# Patient Record
Sex: Male | Born: 1955
Health system: Southern US, Community
[De-identification: ages and names within clinical notes are randomized; demographics above are authoritative.]

## PROBLEM LIST (undated history)

## (undated) ENCOUNTER — Ambulatory Visit (HOSPITAL_COMMUNITY): Admission: EM | Payer: Medicare Other | Source: Home / Self Care

## (undated) DIAGNOSIS — I214 Non-ST elevation (NSTEMI) myocardial infarction: Secondary | ICD-10-CM

## (undated) DIAGNOSIS — Z8701 Personal history of pneumonia (recurrent): Secondary | ICD-10-CM

## (undated) DIAGNOSIS — E669 Obesity, unspecified: Secondary | ICD-10-CM

## (undated) DIAGNOSIS — F329 Major depressive disorder, single episode, unspecified: Secondary | ICD-10-CM

## (undated) DIAGNOSIS — F32A Depression, unspecified: Secondary | ICD-10-CM

## (undated) DIAGNOSIS — Z720 Tobacco use: Secondary | ICD-10-CM

## (undated) DIAGNOSIS — I255 Ischemic cardiomyopathy: Secondary | ICD-10-CM

## (undated) DIAGNOSIS — E119 Type 2 diabetes mellitus without complications: Secondary | ICD-10-CM

## (undated) DIAGNOSIS — I2510 Atherosclerotic heart disease of native coronary artery without angina pectoris: Secondary | ICD-10-CM

## (undated) DIAGNOSIS — Z9289 Personal history of other medical treatment: Secondary | ICD-10-CM

## (undated) DIAGNOSIS — K219 Gastro-esophageal reflux disease without esophagitis: Secondary | ICD-10-CM

## (undated) DIAGNOSIS — E785 Hyperlipidemia, unspecified: Secondary | ICD-10-CM

## (undated) DIAGNOSIS — I5042 Chronic combined systolic (congestive) and diastolic (congestive) heart failure: Secondary | ICD-10-CM

## (undated) DIAGNOSIS — G4733 Obstructive sleep apnea (adult) (pediatric): Secondary | ICD-10-CM

## (undated) DIAGNOSIS — I10 Essential (primary) hypertension: Secondary | ICD-10-CM

## (undated) HISTORY — DX: Personal history of other medical treatment: Z92.89

## (undated) HISTORY — DX: Atherosclerotic heart disease of native coronary artery without angina pectoris: I25.10

## (undated) HISTORY — DX: Major depressive disorder, single episode, unspecified: F32.9

## (undated) HISTORY — DX: Ischemic cardiomyopathy: I25.5

## (undated) HISTORY — DX: Hyperlipidemia, unspecified: E78.5

## (undated) HISTORY — DX: Tobacco use: Z72.0

## (undated) HISTORY — PX: CORONARY ANGIOPLASTY WITH STENT PLACEMENT: SHX49

## (undated) HISTORY — DX: Depression, unspecified: F32.A

## (undated) HISTORY — DX: Essential (primary) hypertension: I10

## (undated) HISTORY — DX: Chronic combined systolic (congestive) and diastolic (congestive) heart failure: I50.42

## (undated) HISTORY — DX: Obstructive sleep apnea (adult) (pediatric): G47.33

## (undated) HISTORY — DX: Obesity, unspecified: E66.9

## (undated) HISTORY — DX: Gastro-esophageal reflux disease without esophagitis: K21.9

---

## 1998-02-14 ENCOUNTER — Emergency Department (HOSPITAL_COMMUNITY): Admission: EM | Admit: 1998-02-14 | Discharge: 1998-02-14 | Payer: Self-pay | Admitting: Emergency Medicine

## 2000-11-17 ENCOUNTER — Inpatient Hospital Stay (HOSPITAL_COMMUNITY): Admission: EM | Admit: 2000-11-17 | Discharge: 2000-11-22 | Payer: Self-pay

## 2001-01-13 ENCOUNTER — Ambulatory Visit (HOSPITAL_COMMUNITY): Admission: RE | Admit: 2001-01-13 | Discharge: 2001-01-13 | Payer: Self-pay | Admitting: Cardiology

## 2001-01-14 ENCOUNTER — Encounter: Payer: Self-pay | Admitting: Cardiology

## 2001-09-10 ENCOUNTER — Ambulatory Visit (HOSPITAL_COMMUNITY): Admission: RE | Admit: 2001-09-10 | Discharge: 2001-09-10 | Payer: Self-pay | Admitting: Cardiology

## 2001-09-11 ENCOUNTER — Encounter: Payer: Self-pay | Admitting: Cardiology

## 2001-12-04 ENCOUNTER — Ambulatory Visit (HOSPITAL_COMMUNITY): Admission: RE | Admit: 2001-12-04 | Discharge: 2001-12-04 | Payer: Self-pay | Admitting: Cardiology

## 2003-09-22 ENCOUNTER — Encounter (HOSPITAL_COMMUNITY): Admission: RE | Admit: 2003-09-22 | Discharge: 2003-12-21 | Payer: Self-pay | Admitting: Cardiology

## 2004-12-17 ENCOUNTER — Inpatient Hospital Stay (HOSPITAL_COMMUNITY): Admission: EM | Admit: 2004-12-17 | Discharge: 2004-12-21 | Payer: Self-pay | Admitting: Emergency Medicine

## 2005-08-21 ENCOUNTER — Emergency Department (HOSPITAL_COMMUNITY): Admission: EM | Admit: 2005-08-21 | Discharge: 2005-08-21 | Payer: Self-pay | Admitting: Emergency Medicine

## 2005-09-10 ENCOUNTER — Emergency Department (HOSPITAL_COMMUNITY): Admission: EM | Admit: 2005-09-10 | Discharge: 2005-09-10 | Payer: Self-pay | Admitting: Emergency Medicine

## 2006-02-09 DIAGNOSIS — Z9861 Coronary angioplasty status: Secondary | ICD-10-CM | POA: Insufficient documentation

## 2006-03-01 ENCOUNTER — Inpatient Hospital Stay (HOSPITAL_COMMUNITY): Admission: EM | Admit: 2006-03-01 | Discharge: 2006-03-05 | Payer: Self-pay | Admitting: Emergency Medicine

## 2006-03-07 ENCOUNTER — Ambulatory Visit: Payer: Self-pay | Admitting: Family Medicine

## 2006-03-08 ENCOUNTER — Ambulatory Visit: Payer: Self-pay | Admitting: *Deleted

## 2006-03-19 ENCOUNTER — Ambulatory Visit: Payer: Self-pay | Admitting: Family Medicine

## 2006-05-27 ENCOUNTER — Ambulatory Visit: Payer: Self-pay | Admitting: Family Medicine

## 2006-08-05 ENCOUNTER — Ambulatory Visit: Payer: Self-pay | Admitting: Family Medicine

## 2006-08-18 ENCOUNTER — Emergency Department (HOSPITAL_COMMUNITY): Admission: EM | Admit: 2006-08-18 | Discharge: 2006-08-18 | Payer: Self-pay | Admitting: Emergency Medicine

## 2006-09-13 ENCOUNTER — Ambulatory Visit: Payer: Self-pay | Admitting: Family Medicine

## 2006-10-15 ENCOUNTER — Ambulatory Visit: Payer: Self-pay | Admitting: Family Medicine

## 2006-10-23 ENCOUNTER — Ambulatory Visit: Payer: Self-pay | Admitting: Cardiovascular Disease

## 2006-10-30 ENCOUNTER — Ambulatory Visit: Payer: Self-pay | Admitting: Cardiovascular Disease

## 2006-11-01 ENCOUNTER — Ambulatory Visit: Payer: Self-pay | Admitting: Cardiovascular Disease

## 2006-11-01 ENCOUNTER — Ambulatory Visit (HOSPITAL_COMMUNITY): Admission: RE | Admit: 2006-11-01 | Discharge: 2006-11-02 | Payer: Self-pay | Admitting: Cardiovascular Disease

## 2006-11-21 ENCOUNTER — Ambulatory Visit: Payer: Self-pay | Admitting: Cardiovascular Disease

## 2007-01-27 DIAGNOSIS — E785 Hyperlipidemia, unspecified: Secondary | ICD-10-CM

## 2007-01-27 DIAGNOSIS — F17200 Nicotine dependence, unspecified, uncomplicated: Secondary | ICD-10-CM | POA: Insufficient documentation

## 2007-01-27 DIAGNOSIS — K219 Gastro-esophageal reflux disease without esophagitis: Secondary | ICD-10-CM

## 2007-02-12 ENCOUNTER — Encounter (INDEPENDENT_AMBULATORY_CARE_PROVIDER_SITE_OTHER): Payer: Self-pay | Admitting: Family Medicine

## 2007-02-12 ENCOUNTER — Ambulatory Visit: Payer: Self-pay | Admitting: Cardiovascular Disease

## 2007-02-14 ENCOUNTER — Encounter (INDEPENDENT_AMBULATORY_CARE_PROVIDER_SITE_OTHER): Payer: Self-pay | Admitting: Family Medicine

## 2007-02-20 ENCOUNTER — Telehealth (INDEPENDENT_AMBULATORY_CARE_PROVIDER_SITE_OTHER): Payer: Self-pay | Admitting: *Deleted

## 2007-02-26 ENCOUNTER — Encounter (INDEPENDENT_AMBULATORY_CARE_PROVIDER_SITE_OTHER): Payer: Self-pay | Admitting: *Deleted

## 2007-03-04 ENCOUNTER — Ambulatory Visit: Payer: Self-pay | Admitting: Family Medicine

## 2007-03-05 ENCOUNTER — Encounter (INDEPENDENT_AMBULATORY_CARE_PROVIDER_SITE_OTHER): Payer: Self-pay | Admitting: Family Medicine

## 2007-03-05 LAB — CONVERTED CEMR LAB
AST: 20 units/L (ref 0–37)
CO2: 27 meq/L (ref 19–32)
Chloride: 108 meq/L (ref 96–112)
Cholesterol: 134 mg/dL (ref 0–200)
Glucose, Bld: 96 mg/dL (ref 70–99)
LDL Cholesterol: 78 mg/dL (ref 0–99)
Potassium: 4.2 meq/L (ref 3.5–5.3)
Sodium: 145 meq/L (ref 135–145)
Total Protein: 6.8 g/dL (ref 6.0–8.3)

## 2007-03-14 ENCOUNTER — Encounter (INDEPENDENT_AMBULATORY_CARE_PROVIDER_SITE_OTHER): Payer: Self-pay | Admitting: Family Medicine

## 2007-06-12 HISTORY — PX: CORONARY STENT PLACEMENT: SHX1402

## 2007-09-08 ENCOUNTER — Ambulatory Visit: Payer: Self-pay | Admitting: Cardiovascular Disease

## 2007-09-12 ENCOUNTER — Encounter (INDEPENDENT_AMBULATORY_CARE_PROVIDER_SITE_OTHER): Payer: Self-pay | Admitting: Family Medicine

## 2007-09-12 ENCOUNTER — Telehealth (INDEPENDENT_AMBULATORY_CARE_PROVIDER_SITE_OTHER): Payer: Self-pay | Admitting: Internal Medicine

## 2007-09-21 ENCOUNTER — Emergency Department (HOSPITAL_COMMUNITY): Admission: EM | Admit: 2007-09-21 | Discharge: 2007-09-21 | Payer: Self-pay | Admitting: Emergency Medicine

## 2007-11-07 ENCOUNTER — Encounter (INDEPENDENT_AMBULATORY_CARE_PROVIDER_SITE_OTHER): Payer: Self-pay | Admitting: Family Medicine

## 2008-01-09 ENCOUNTER — Ambulatory Visit: Payer: Self-pay | Admitting: Nurse Practitioner

## 2008-02-05 ENCOUNTER — Telehealth (INDEPENDENT_AMBULATORY_CARE_PROVIDER_SITE_OTHER): Payer: Self-pay | Admitting: *Deleted

## 2008-02-14 ENCOUNTER — Emergency Department (HOSPITAL_COMMUNITY): Admission: EM | Admit: 2008-02-14 | Discharge: 2008-02-14 | Payer: Self-pay | Admitting: Emergency Medicine

## 2008-02-17 ENCOUNTER — Ambulatory Visit: Payer: Self-pay | Admitting: Family Medicine

## 2008-02-17 DIAGNOSIS — B86 Scabies: Secondary | ICD-10-CM | POA: Insufficient documentation

## 2008-02-19 ENCOUNTER — Emergency Department (HOSPITAL_COMMUNITY): Admission: EM | Admit: 2008-02-19 | Discharge: 2008-02-19 | Payer: Self-pay | Admitting: Emergency Medicine

## 2008-02-23 ENCOUNTER — Telehealth (INDEPENDENT_AMBULATORY_CARE_PROVIDER_SITE_OTHER): Payer: Self-pay | Admitting: *Deleted

## 2008-02-24 ENCOUNTER — Ambulatory Visit: Payer: Self-pay | Admitting: Family Medicine

## 2008-03-09 LAB — CONVERTED CEMR LAB
ALT: 31 units/L (ref 0–53)
AST: 16 units/L (ref 0–37)
Alkaline Phosphatase: 91 units/L (ref 39–117)
CO2: 21 meq/L (ref 19–32)
Chloride: 108 meq/L (ref 96–112)
Cholesterol: 204 mg/dL — ABNORMAL HIGH (ref 0–200)
Creatinine, Ser: 1.33 mg/dL (ref 0.40–1.50)
Glucose, Bld: 99 mg/dL (ref 70–99)
HDL: 52 mg/dL (ref >39)
Sodium: 144 meq/L (ref 135–145)
Total Bilirubin: 0.4 mg/dL (ref 0.3–1.2)
Triglycerides: 91 mg/dL (ref <150)
VLDL: 18 mg/dL (ref 0–40)

## 2008-04-12 ENCOUNTER — Ambulatory Visit: Payer: Self-pay | Admitting: Family Medicine

## 2008-07-22 ENCOUNTER — Ambulatory Visit: Payer: Self-pay | Admitting: Cardiovascular Disease

## 2008-07-23 ENCOUNTER — Telehealth (INDEPENDENT_AMBULATORY_CARE_PROVIDER_SITE_OTHER): Payer: Self-pay | Admitting: *Deleted

## 2008-08-03 ENCOUNTER — Encounter (INDEPENDENT_AMBULATORY_CARE_PROVIDER_SITE_OTHER): Payer: Self-pay | Admitting: *Deleted

## 2008-08-30 ENCOUNTER — Ambulatory Visit: Payer: Self-pay | Admitting: Family Medicine

## 2008-08-30 DIAGNOSIS — B360 Pityriasis versicolor: Secondary | ICD-10-CM | POA: Insufficient documentation

## 2008-08-31 ENCOUNTER — Ambulatory Visit: Payer: Self-pay | Admitting: Family Medicine

## 2008-09-01 DIAGNOSIS — I10 Essential (primary) hypertension: Secondary | ICD-10-CM | POA: Insufficient documentation

## 2008-09-01 LAB — CONVERTED CEMR LAB
ALT: 23 units/L (ref 0–53)
Alkaline Phosphatase: 79 units/L (ref 39–117)
BUN: 13 mg/dL (ref 6–23)
Basophils Absolute: 0 10*3/uL (ref 0.0–0.1)
Basophils Relative: 1 % (ref 0–1)
CO2: 24 meq/L (ref 19–32)
Calcium: 9.6 mg/dL (ref 8.4–10.5)
HCT: 46.8 % (ref 39.0–52.0)
LDL Cholesterol: 78 mg/dL (ref 0–99)
Lymphs Abs: 3.4 10*3/uL (ref 0.7–4.0)
MCHC: 34.4 g/dL (ref 30.0–36.0)
Monocytes Absolute: 0.5 10*3/uL (ref 0.1–1.0)
Neutrophils Relative %: 29 % — ABNORMAL LOW (ref 43–77)
Platelets: 209 10*3/uL (ref 150–400)
Potassium: 4.2 meq/L (ref 3.5–5.3)
RDW: 16.3 % — ABNORMAL HIGH (ref 11.5–15.5)
Total CHOL/HDL Ratio: 3
Total Protein: 7.1 g/dL (ref 6.0–8.3)
VLDL: 11 mg/dL (ref 0–40)

## 2008-09-15 ENCOUNTER — Ambulatory Visit (HOSPITAL_BASED_OUTPATIENT_CLINIC_OR_DEPARTMENT_OTHER): Admission: RE | Admit: 2008-09-15 | Discharge: 2008-09-15 | Payer: Self-pay | Admitting: Family Medicine

## 2008-09-15 ENCOUNTER — Ambulatory Visit: Payer: Self-pay | Admitting: Family Medicine

## 2008-09-15 DIAGNOSIS — G4733 Obstructive sleep apnea (adult) (pediatric): Secondary | ICD-10-CM | POA: Insufficient documentation

## 2008-09-16 ENCOUNTER — Ambulatory Visit: Payer: Self-pay | Admitting: Family Medicine

## 2008-09-18 ENCOUNTER — Ambulatory Visit: Payer: Self-pay | Admitting: Internal Medicine

## 2008-09-30 ENCOUNTER — Ambulatory Visit: Payer: Self-pay | Admitting: Family Medicine

## 2008-10-19 ENCOUNTER — Ambulatory Visit: Payer: Self-pay | Admitting: Family Medicine

## 2008-10-21 ENCOUNTER — Encounter (INDEPENDENT_AMBULATORY_CARE_PROVIDER_SITE_OTHER): Payer: Self-pay | Admitting: Family Medicine

## 2008-11-09 ENCOUNTER — Encounter (INDEPENDENT_AMBULATORY_CARE_PROVIDER_SITE_OTHER): Payer: Self-pay | Admitting: Family Medicine

## 2008-11-12 ENCOUNTER — Encounter (INDEPENDENT_AMBULATORY_CARE_PROVIDER_SITE_OTHER): Payer: Self-pay | Admitting: Nurse Practitioner

## 2008-11-15 ENCOUNTER — Encounter (INDEPENDENT_AMBULATORY_CARE_PROVIDER_SITE_OTHER): Payer: Self-pay | Admitting: Family Medicine

## 2008-12-20 ENCOUNTER — Ambulatory Visit: Payer: Self-pay | Admitting: Family Medicine

## 2008-12-30 ENCOUNTER — Encounter (INDEPENDENT_AMBULATORY_CARE_PROVIDER_SITE_OTHER): Payer: Self-pay | Admitting: Family Medicine

## 2009-02-16 ENCOUNTER — Ambulatory Visit: Payer: Self-pay | Admitting: Cardiovascular Disease

## 2009-03-19 ENCOUNTER — Ambulatory Visit: Payer: Self-pay | Admitting: Internal Medicine

## 2009-03-19 ENCOUNTER — Emergency Department (HOSPITAL_COMMUNITY): Admission: EM | Admit: 2009-03-19 | Discharge: 2009-03-19 | Payer: Self-pay | Admitting: Emergency Medicine

## 2009-03-24 ENCOUNTER — Ambulatory Visit: Payer: Self-pay | Admitting: Physician Assistant

## 2009-03-24 DIAGNOSIS — R0602 Shortness of breath: Secondary | ICD-10-CM

## 2009-03-24 DIAGNOSIS — R079 Chest pain, unspecified: Secondary | ICD-10-CM

## 2009-03-31 ENCOUNTER — Encounter (INDEPENDENT_AMBULATORY_CARE_PROVIDER_SITE_OTHER): Payer: Self-pay | Admitting: Internal Medicine

## 2009-03-31 ENCOUNTER — Ambulatory Visit (HOSPITAL_COMMUNITY): Admission: RE | Admit: 2009-03-31 | Discharge: 2009-03-31 | Payer: Self-pay | Admitting: Internal Medicine

## 2009-03-31 ENCOUNTER — Ambulatory Visit: Payer: Self-pay | Admitting: Cardiology

## 2009-04-14 ENCOUNTER — Encounter: Payer: Self-pay | Admitting: Physician Assistant

## 2009-05-11 ENCOUNTER — Encounter: Payer: Self-pay | Admitting: Physician Assistant

## 2009-06-28 ENCOUNTER — Telehealth: Payer: Self-pay | Admitting: Physician Assistant

## 2009-07-20 ENCOUNTER — Encounter: Payer: Self-pay | Admitting: Physician Assistant

## 2009-07-21 ENCOUNTER — Ambulatory Visit: Payer: Self-pay | Admitting: Physician Assistant

## 2009-07-21 DIAGNOSIS — F329 Major depressive disorder, single episode, unspecified: Secondary | ICD-10-CM

## 2009-07-21 DIAGNOSIS — R209 Unspecified disturbances of skin sensation: Secondary | ICD-10-CM

## 2009-07-21 DIAGNOSIS — F32A Depression, unspecified: Secondary | ICD-10-CM | POA: Insufficient documentation

## 2009-07-22 LAB — CONVERTED CEMR LAB
ALT: 16 units/L (ref 0–53)
Albumin: 4.4 g/dL (ref 3.5–5.2)
Alkaline Phosphatase: 73 units/L (ref 39–117)
CO2: 27 meq/L (ref 19–32)
Creatinine, Ser: 1.6 mg/dL — ABNORMAL HIGH (ref 0.40–1.50)
Eosinophils Relative: 3 % (ref 0–5)
Glucose, Bld: 99 mg/dL (ref 70–99)
HCT: 46.1 % (ref 39.0–52.0)
Hemoglobin: 15.2 g/dL (ref 13.0–17.0)
Lymphocytes Relative: 56 % — ABNORMAL HIGH (ref 12–46)
MCHC: 33 g/dL (ref 30.0–36.0)
MCV: 86.7 fL (ref 78.0–100.0)
Neutro Abs: 1.9 10*3/uL (ref 1.7–7.7)
Neutrophils Relative %: 28 % — ABNORMAL LOW (ref 43–77)
Platelets: 203 10*3/uL (ref 150–400)
Potassium: 4.9 meq/L (ref 3.5–5.3)
RBC: 5.32 M/uL (ref 4.22–5.81)
WBC: 6.7 10*3/uL (ref 4.0–10.5)

## 2009-08-04 ENCOUNTER — Ambulatory Visit: Payer: Self-pay | Admitting: Physician Assistant

## 2009-08-04 DIAGNOSIS — R944 Abnormal results of kidney function studies: Secondary | ICD-10-CM

## 2009-08-04 LAB — CONVERTED CEMR LAB
Calcium: 9.5 mg/dL (ref 8.4–10.5)
Creatinine, Ser: 1.27 mg/dL (ref 0.40–1.50)
Glucose, Bld: 90 mg/dL (ref 70–99)
Sodium: 144 meq/L (ref 135–145)

## 2009-08-05 ENCOUNTER — Encounter: Payer: Self-pay | Admitting: Physician Assistant

## 2009-08-23 ENCOUNTER — Ambulatory Visit: Payer: Self-pay | Admitting: Physician Assistant

## 2009-08-23 DIAGNOSIS — M79609 Pain in unspecified limb: Secondary | ICD-10-CM | POA: Insufficient documentation

## 2009-08-23 DIAGNOSIS — H6120 Impacted cerumen, unspecified ear: Secondary | ICD-10-CM

## 2009-08-23 DIAGNOSIS — R002 Palpitations: Secondary | ICD-10-CM | POA: Insufficient documentation

## 2009-08-23 DIAGNOSIS — R809 Proteinuria, unspecified: Secondary | ICD-10-CM | POA: Insufficient documentation

## 2009-08-31 ENCOUNTER — Encounter: Payer: Self-pay | Admitting: Physician Assistant

## 2009-08-31 ENCOUNTER — Encounter (INDEPENDENT_AMBULATORY_CARE_PROVIDER_SITE_OTHER): Payer: Self-pay | Admitting: Internal Medicine

## 2009-08-31 ENCOUNTER — Ambulatory Visit: Payer: Self-pay | Admitting: Vascular Surgery

## 2009-08-31 ENCOUNTER — Ambulatory Visit (HOSPITAL_COMMUNITY): Admission: RE | Admit: 2009-08-31 | Discharge: 2009-08-31 | Payer: Self-pay | Admitting: Internal Medicine

## 2009-09-01 ENCOUNTER — Encounter: Payer: Self-pay | Admitting: Physician Assistant

## 2009-09-02 ENCOUNTER — Encounter: Payer: Self-pay | Admitting: Physician Assistant

## 2009-09-02 LAB — CONVERTED CEMR LAB
Creatinine 24 HR UR: 2964 mg/24hr — ABNORMAL HIGH (ref 800–2000)
Protein, Ur: 650 mg/24hr — ABNORMAL HIGH (ref 50–100)

## 2009-09-06 ENCOUNTER — Ambulatory Visit: Payer: Self-pay | Admitting: Physician Assistant

## 2009-09-16 ENCOUNTER — Telehealth: Payer: Self-pay | Admitting: Cardiovascular Disease

## 2009-09-20 ENCOUNTER — Ambulatory Visit: Payer: Self-pay | Admitting: Physician Assistant

## 2009-09-26 ENCOUNTER — Encounter: Payer: Self-pay | Admitting: Physician Assistant

## 2009-10-07 ENCOUNTER — Encounter: Payer: Self-pay | Admitting: Physician Assistant

## 2009-10-07 DIAGNOSIS — D126 Benign neoplasm of colon, unspecified: Secondary | ICD-10-CM | POA: Insufficient documentation

## 2009-11-28 ENCOUNTER — Telehealth: Payer: Self-pay | Admitting: Physician Assistant

## 2009-11-28 ENCOUNTER — Ambulatory Visit: Payer: Self-pay | Admitting: Physician Assistant

## 2009-11-28 DIAGNOSIS — H9319 Tinnitus, unspecified ear: Secondary | ICD-10-CM | POA: Insufficient documentation

## 2009-11-28 DIAGNOSIS — M6740 Ganglion, unspecified site: Secondary | ICD-10-CM

## 2009-11-28 LAB — CONVERTED CEMR LAB
BUN: 14 mg/dL (ref 6–23)
CO2: 28 meq/L (ref 19–32)
Calcium: 10.1 mg/dL (ref 8.4–10.5)
Chloride: 109 meq/L (ref 96–112)
Glucose, Bld: 85 mg/dL (ref 70–99)
Potassium: 4.7 meq/L (ref 3.5–5.3)

## 2009-11-30 ENCOUNTER — Encounter: Payer: Self-pay | Admitting: Physician Assistant

## 2009-12-21 LAB — CONVERTED CEMR LAB
Chloride: 109 meq/L
Creatinine, Ser: 1.4 mg/dL
Sodium: 143 meq/L

## 2009-12-22 ENCOUNTER — Telehealth: Payer: Self-pay | Admitting: Physician Assistant

## 2009-12-26 ENCOUNTER — Ambulatory Visit: Payer: Self-pay | Admitting: Nurse Practitioner

## 2009-12-26 ENCOUNTER — Encounter: Payer: Self-pay | Admitting: Physician Assistant

## 2009-12-26 DIAGNOSIS — N410 Acute prostatitis: Secondary | ICD-10-CM

## 2009-12-26 DIAGNOSIS — K649 Unspecified hemorrhoids: Secondary | ICD-10-CM | POA: Insufficient documentation

## 2010-01-18 ENCOUNTER — Encounter: Payer: Self-pay | Admitting: Physician Assistant

## 2010-01-26 ENCOUNTER — Ambulatory Visit: Payer: Self-pay | Admitting: Cardiovascular Disease

## 2010-01-26 DIAGNOSIS — R0989 Other specified symptoms and signs involving the circulatory and respiratory systems: Secondary | ICD-10-CM

## 2010-01-26 DIAGNOSIS — R0609 Other forms of dyspnea: Secondary | ICD-10-CM | POA: Insufficient documentation

## 2010-01-31 ENCOUNTER — Encounter: Payer: Self-pay | Admitting: Cardiology

## 2010-01-31 ENCOUNTER — Telehealth (INDEPENDENT_AMBULATORY_CARE_PROVIDER_SITE_OTHER): Payer: Self-pay | Admitting: *Deleted

## 2010-02-01 ENCOUNTER — Ambulatory Visit: Payer: Self-pay

## 2010-02-01 ENCOUNTER — Ambulatory Visit: Payer: Self-pay | Admitting: Cardiology

## 2010-02-01 ENCOUNTER — Encounter (HOSPITAL_COMMUNITY): Admission: RE | Admit: 2010-02-01 | Discharge: 2010-02-28 | Payer: Self-pay | Admitting: Cardiovascular Disease

## 2010-02-02 ENCOUNTER — Ambulatory Visit: Payer: Self-pay

## 2010-02-04 ENCOUNTER — Encounter: Payer: Self-pay | Admitting: Physician Assistant

## 2010-02-06 ENCOUNTER — Encounter: Payer: Self-pay | Admitting: Physician Assistant

## 2010-02-14 ENCOUNTER — Telehealth: Payer: Self-pay | Admitting: Cardiovascular Disease

## 2010-02-14 ENCOUNTER — Encounter: Payer: Self-pay | Admitting: Cardiovascular Disease

## 2010-02-15 ENCOUNTER — Encounter (INDEPENDENT_AMBULATORY_CARE_PROVIDER_SITE_OTHER): Payer: Self-pay | Admitting: *Deleted

## 2010-02-20 ENCOUNTER — Telehealth: Payer: Self-pay | Admitting: Cardiovascular Disease

## 2010-03-07 ENCOUNTER — Encounter: Payer: Self-pay | Admitting: Cardiovascular Disease

## 2010-03-07 ENCOUNTER — Ambulatory Visit (HOSPITAL_COMMUNITY): Admission: RE | Admit: 2010-03-07 | Discharge: 2010-03-07 | Payer: Self-pay | Admitting: Cardiovascular Disease

## 2010-03-07 ENCOUNTER — Ambulatory Visit: Payer: Self-pay

## 2010-03-07 ENCOUNTER — Ambulatory Visit: Payer: Self-pay | Admitting: Cardiology

## 2010-03-09 ENCOUNTER — Encounter: Payer: Self-pay | Admitting: Physician Assistant

## 2010-04-12 ENCOUNTER — Encounter: Payer: Self-pay | Admitting: Physician Assistant

## 2010-04-20 ENCOUNTER — Encounter (INDEPENDENT_AMBULATORY_CARE_PROVIDER_SITE_OTHER): Payer: Self-pay | Admitting: Nurse Practitioner

## 2010-05-18 ENCOUNTER — Telehealth (INDEPENDENT_AMBULATORY_CARE_PROVIDER_SITE_OTHER): Payer: Self-pay | Admitting: Nurse Practitioner

## 2010-05-18 ENCOUNTER — Emergency Department (HOSPITAL_COMMUNITY): Admission: EM | Admit: 2010-05-18 | Discharge: 2009-12-21 | Payer: Self-pay | Admitting: Emergency Medicine

## 2010-07-02 ENCOUNTER — Encounter: Payer: Self-pay | Admitting: Cardiology

## 2010-07-09 LAB — CONVERTED CEMR LAB
AST: 15 units/L (ref 0–37)
Albumin: 4.4 g/dL (ref 3.5–5.2)
BUN: 12 mg/dL (ref 6–23)
BUN: 8 mg/dL (ref 6–23)
Bilirubin Urine: NEGATIVE
Blood in Urine, dipstick: NEGATIVE
Chloride: 106 meq/L (ref 96–112)
Chloride: 108 meq/L (ref 96–112)
Creatinine, Ser: 1.02 mg/dL (ref 0.40–1.50)
Creatinine, Ser: 1.14 mg/dL (ref 0.40–1.50)
Glucose, Bld: 95 mg/dL (ref 70–99)
Glucose, Urine, Semiquant: NEGATIVE
Ketones, urine, test strip: NEGATIVE
OCCULT 1: NEGATIVE
Protein, U semiquant: 100
Sodium: 144 meq/L (ref 135–145)
Total Bilirubin: 0.4 mg/dL (ref 0.3–1.2)
Total Protein: 7 g/dL (ref 6.0–8.3)
Urobilinogen, UA: 0.2

## 2010-07-11 NOTE — Assessment & Plan Note (Signed)
Summary: Cardiology Nuclear Testing  Nuclear Med Background Indications for Stress Test: Evaluation for Ischemia, Stent Patency, PTCA Patency   History: Angioplasty, Echo, GXT, Heart Catheterization, Myocardial Infarction, Stents  History Comments: 94 GXT neg EF=55-60%;02 MI inferior wall and stent of RCA;06 Stent of RCA;07 stent of CFX;08 Cath 80% in-stent restenosis Rx with PTCA;10/10 Echo mod. LVH and EF=55%.History of ICM/LVD 40% mild pulm Htn and  history of pulm Htn.  Symptoms: Chest Pain, DOE, Fatigue, Fatigue with Exertion, Palpitations, Rapid HR, SOB  Symptoms Comments: Last CP yesterday.   Nuclear Pre-Procedure Cardiac Risk Factors: Hypertension, Lipids, Smoker Caffeine/Decaff Intake: None NPO After: 8:30 PM Lungs: Clear IV 0.9% NS with Angio Cath: 20g     IV Site: R Hand IV Started by: Doyne Keel, CNMT Chest Size (in) 56     Height (in): 74.25 Weight (lb): 320 BMI: 40.96 Tech Comments: Held coreg today.  Nuclear Med Study 1 or 2 day study:  2 day     Stress Test Type:  Lexiscan Reading MD:  Marca Ancona, MD     Referring MD:  M.Cooper Resting Radionuclide:  Technetium 66m Tetrofosmin     Resting Radionuclide Dose:  33 mCi  Stress Radionuclide:  Technetium 73m Tetrofosmin     Stress Radionuclide Dose:  33 mCi   Stress Protocol      Max HR:  70 bpm Max Systolic BP: 126 mm HgRate Pressure Product:  8820  Lexiscan: 0.4 mg   Stress Test Technologist:  Irean Hong,  RN     Nuclear Technologist:  Domenic Polite, CNMT  Rest Procedure  Myocardial perfusion imaging was performed at rest 45 minutes following the intravenous administration of Technetium 57m Tetrofosmin.  Stress Procedure  The patient received IV Lexiscan 0.4 mg over 15-seconds.  Technetium 25m Tetrofosmin injected at 30-seconds.  The EKG was nondiagnostic due to baseline changes, rare PVC.   Quantitative spect images were obtained after a 45 minute delay.  QPS Raw Data Images:  Normal; no motion  artifact; normal heart/lung ratio. Stress Images:  Moderate decrease in activity in the inferior wall Rest Images:  Same as stress Subtraction (SDS):  No evidence of ischemia. Transient Ischemic Dilatation:  1.20  (Normal <1.22)  Lung/Heart Ratio:  0.39  (Normal <0.45)  Quantitative Gated Spect Images QGS EDV:  228 ml QGS ESV:  145 ml QGS EF:  36 % QGS cine images:  Severe hypokinesis of the inferior wall and inferior septum.  Findings Abnormal      Overall Impression  Exercise Capacity: Lexiscan with no exercise. BP Response: Normal blood pressure response. Clinical Symptoms: SOB ECG Impression: No significant ST segment change suggestive of ischemia. Overall Impression Comments: There is moderate scar in the inferior wall. There is slight peri-infarct ischemia.  Appended Document: Cardiology Nuclear Testing Inferior scar with mild peri-infarct ischemia. Appears low-risk and I would favor ongoing risk reduction measures.  Appended Document: Cardiology Nuclear Testing Per Dr [name] the pt also needs an ECHO to assess EF.  EF 36% by myoview.  I attempted to reach the pt but his phone is turned off or out of service area.  Letter mailed to the pt.

## 2010-07-11 NOTE — Miscellaneous (Signed)
Summary: Review at next office visit  Clinical Lists Changes  Problems: Assessed GASTROESOPHAGEAL REFLUX DISEASE as comment only - try to decrease dose of pepcid to 20 mg two times a day at next visit  His updated medication list for this problem includes:    Famotidine 40 Mg Tabs (Famotidine) .[name]... Take 1 tablet by mouth every 12 hours for stomach        Impression & Recommendations:  Problem # 1:  GASTROESOPHAGEAL REFLUX DISEASE (ICD-530.81) try to decrease dose of pepcid to 20 mg two times a day at next visit  His updated medication list for this problem includes:    Famotidine 40 Mg Tabs (Famotidine) .[name]... Take 1 tablet by mouth every 12 hours for stomach  Complete Medication List: 1)  Coreg 25 Mg Tabs (Carvedilol) .[name].. 1 tablet by mouth two times a day 2)  Plavix 75 Mg Tabs (Clopidogrel bisulfate) .... Take 1 tablet by mouth daily 3)  Norvasc 10 Mg Tabs (Amlodipine besylate) .... Take 1 tablet by mouth every morning 4)  Benazepril Hcl 40 Mg Tabs (Benazepril hcl) .... Take 1 tablet by mouth every morning 5)  Lasix 40 Mg Tabs (Furosemide) .... Take 1 tablet by mouth once a day 6)  Potassium Chloride Crys Cr 20 Meq Tbcr (Potassium chloride crys cr) .... Take 1 tablet by mouth daily 7)  Clonidine Hcl 0.2 Mg Tabs (Clonidine hcl) .... Take 1 tablet by mouth two times a day 8)  Nitroglycerin 0.4 Mg Subl (Nitroglycerin) .[name].. 1 tab under tongue as needed. may repeat q 5 minutes up to 3 doses if chest pain not relieved. 9)  Simvastatin 40 Mg Tabs (Simvastatin) .... Take 1 tab by mouth at bedtime for cholesterol 10)  Famotidine 40 Mg Tabs (Famotidine) .... Take 1 tablet by mouth every 12 hours for stomach 11)  Cozaar 100 Mg Tabs (Losartan potassium) .[name].. 1 tab by mouth daily in place of avapro 12)  Celexa 40 Mg Tabs (Citalopram hydrobromide) .... Take 1 tablet by mouth once a day 13)  Flonase 50 Mcg/act Susp (Fluticasone propionate) .... 2 sprays each nostril for one month 14)   Tamsulosin Hcl 0.4 Mg Caps (Tamsulosin hcl) .... Take 1 capsule by mouth once a day 15)  Aspirin 81 Mg Tbec (Aspirin) .... Take one tablet by mouth daily

## 2010-07-11 NOTE — Progress Notes (Signed)
Summary: nuc pre procedure  Phone Note Outgoing Call   Call placed by: Debarah Crape Call placed to: Patient Reason for Call: Confirm/change Appt Summary of Call: Unable able to reach x 2 to give instructions. Unable to leave message.     Nuclear Med Background Indications for Stress Test: Evaluation for Ischemia, Stent Patency, PTCA Patency   History: Angioplasty, Echo, GXT, Heart Catheterization, Myocardial Infarction, Stents  History Comments: 94 GXT neg EF=55-60% MI inferior wall and stent of RCA;06 Stent of RCA;07 stent of CFX;08 Cath 80% in-stent restenosis Rx with PTCA;10/10 Echo mod. LVH and EF=55%.History of ICM/LVD 40% mild pulm Htn and  history of pulm Htn.  Symptoms: DOE, Palpitations    Nuclear Pre-Procedure Cardiac Risk Factors: Hypertension, Lipids, Smoker Height (in): 74.25

## 2010-07-11 NOTE — Letter (Signed)
Summary: [facility]  [facility]   Imported By: Arta Bruce 11/14/2009 10:52:05  _____________________________________________________________________  External Attachment:    Type:   Image     Comment:   External Document

## 2010-07-11 NOTE — Progress Notes (Signed)
Summary: Office Visit//DEPRESSION SCREENING  Office Visit//DEPRESSION SCREENING   Imported By: Arta Bruce 09/15/2009 12:20:39  _____________________________________________________________________  External Attachment:    Type:   Image     Comment:   External Document

## 2010-07-11 NOTE — Progress Notes (Signed)
Summary: Office Visit//DEPRESSION SCREENING  Office Visit//DEPRESSION SCREENING   Imported By: Arta Bruce 12/21/2009 15:27:47  _____________________________________________________________________  External Attachment:    Type:   Image     Comment:   External Document

## 2010-07-11 NOTE — Miscellaneous (Signed)
Summary: Immunizations//FLU VACCINE  Immunizations//FLU VACCINE   Imported By: Arta Bruce 04/21/2010 14:38:57  _____________________________________________________________________  External Attachment:    Type:   Image     Comment:   External Document

## 2010-07-11 NOTE — Assessment & Plan Note (Signed)
Summary: physical exam//gk   Vital Signs:  Patient profile:   55 year old male Height:      74.25 inches Weight:      324 pounds BMI:     41.47 Temp:     97.9 degrees F oral Pulse rate:   57 / minute Pulse rhythm:   regular Resp:     18 per minute BP sitting:   123 / 81  (left arm) Cuff size:   large  Vitals Entered By: [name] (August 23, 2009 8:50 AM) CC: CPE... pt says his ears are ringing and he says since he started anti-depress med...., Hypertension Management Is Patient Diabetic? No Pain Assessment Patient in pain? no       Does patient need assistance? Functional Status Self care Ambulation Normal   Primary Care Provider:  Tereso Newcomer, PA-C  CC:  CPE... pt says his ears are ringing and he says since he started anti-depress med.... and Hypertension Management.  History of Present Illness: Here for CPE.  Depression:  Celexa helping.  Mood is better.  PHQ9=10 (down from 13).  Missed appt with Marchelle Folks.  Supposed to see her this afternoon but thinks he may need to reschedule.  Ran out of med last week.  DId not know he had refills.  No thoughts of suicide.  Tinnitus:  Has noticed for mos.  Slight trouble hearing.  No dizziness.  No episodic tinnitus.  No episodic hearing loss.  Health Maint:   Never had a colo.  No FHx of colon CA. Wants PSA. Vaccines up to date.  Leg pain: Has noted some leg "heaviness" with walking that improves with rest.  Worried about PAD.  Has never had ABIs done.   Hypertension History:      He complains of palpitations, but denies chest pain, dyspnea with exertion, syncope, and side effects from treatment.  Further comments include: "flutter in chest" feels like skips a beat; no tachypalps; no assoc symptoms.        Positive major cardiovascular risk factors include male age 70 years old or older, hyperlipidemia, hypertension, and current tobacco user.  Negative major cardiovascular risk factors include negative family history for  ischemic heart disease.        Positive history for target organ damage include ASHD (either angina/prior MI/prior CABG).    Habits & Providers  Alcohol-Tobacco-Diet     Tobacco Status: current  Problems Prior to Update: 1)  Proteinuria  (ICD-791.0) 2)  Impacted Cerumen  (ICD-380.4) 3)  Leg Pain  (ICD-729.5) 4)  Palpitations  (ICD-785.1) 5)  Preventive Health Care  (ICD-V70.0) 6)  Nonspecific Abnorm Results Kidney Function Study  (ICD-794.4) 7)  Paresthesia  (ICD-782.0) 8)  Depression  (ICD-311) 9)  Family History Depression  (ICD-V17.0) 10)  Dyspnea  (ICD-786.05) 11)  Chest Pain Unspecified  (ICD-786.50) 12)  Obstructive Sleep Apnea  (ICD-327.23) 13)  Essential Hypertension, Benign  (ICD-401.1) 14)  Tinea Versicolor  (ICD-111.0) 15)  Need Prophylactic Vaccination&inoculation Flu  (ICD-V04.81) 16)  Scabies  (ICD-133.0) 17)  Atherosclerosis, Coronary, Native Artery  (ICD-414.01) 18)  Smoker  (ICD-305.1) 19)  Percutaneous Transluminal Coronary Angioplasty, Hx of  (ICD-V45.82) 20)  Gastroesophageal Reflux Disease  (ICD-530.81) 21)  Dyslipidemia  (ICD-272.4) 22)  Coronary Artery Disease  (ICD-414.00)  Current Medications (verified): 1)  Coreg 25 Mg  Tabs (Carvedilol) .[name].. 1 Tablet By Mouth Two Times A Day 2)  Plavix 75 Mg  Tabs (Clopidogrel Bisulfate) .... Take 1 Tablet By Mouth Daily 3)  Norvasc 10 Mg  Tabs (Amlodipine Besylate) .... Take 1 Tablet By Mouth Every Morning 4)  Benazepril Hcl 40 Mg  Tabs (Benazepril Hcl) .... Take 1 Tablet By Mouth Every Morning 5)  Lasix 40 Mg  Tabs (Furosemide) .... Take 1 Tablet By Mouth Once A Day 6)  Potassium Chloride Crys Cr 20 Meq  Tbcr (Potassium Chloride Crys Cr) .... Take 1 Tablet By Mouth Daily 7)  Clonidine Hcl 0.2 Mg Tabs (Clonidine Hcl) .... Take 1 Tablet By Mouth Two Times A Day 8)  Nitroglycerin 0.4 Mg  Subl (Nitroglycerin) .[name].. 1 Tab Under Tongue As Needed. May Repeat Q 5 Minutes Up To 3 Doses If Chest Pain Not Relieved. 9)   Pravastatin Sodium 80 Mg Tabs (Pravastatin Sodium) .... Take 1 Tablet By Mouth Once A Day For Cholesterol 10)  Famotidine 40 Mg Tabs (Famotidine) .... Take 1 Tablet By Mouth Every 12 Hours For Stomach 11)  Cozaar 100 Mg Tabs (Losartan Potassium) .[name].. 1 Tab By Mouth Daily in Place of Avapro 12)  Cpap Equipment .... Titrate To 17cwp,ahi 0 Per Hr. May Try Resmed Mirage Quattro Full-Face Mask With Heated Humidifier 13)  Celexa 20 Mg Tabs (Citalopram Hydrobromide) .... Take 1 Tablet By Mouth Once A Day 14)  Aspir-Low 81 Mg Tbec (Aspirin) .... Take 1 Tablet By Mouth Once A Day  Allergies (verified): 1)  ! Penicillin  Past History:  Past Medical History: Last updated: 04/14/2009  SMOKER (ICD-305.1) PERCUTANEOUS TRANSLUMINAL CORONARY ANGIOPLASTY, HX OF (ICD-V45.82) GASTROESOPHAGEAL REFLUX DISEASE (ICD-530.81) DYSLIPIDEMIA (ICD-272.4) HYPERTENSION, HX OF (ICD-V12.50)      a.  Moderate LVH on echo 03/2009 CORONARY ARTERY DISEASE (ICD-414.00)      a.  h/o stent to RCA and 1st PLB      b.  cath in 2008:  80% in stent restenosis Rx'd with PTCA (cutting balloon)      c.  EF 55-60% at cath 2008 ISCHEMIC CARDIOMYOPATHY, LVEF 40%      a.  EF improved to 55% on Echo 03/2009 with inferior akinesis  Past Surgical History: Last updated: 08/30/2008 s/p cardiac stent x 2  Family History: Reviewed history from 07/21/2009 and no changes required. there is no history of coronary artery disease in the family. Family History Depression - mom  Social History: lives alone. Negative alcohol. Unemployed. Current Smoker  Review of Systems  The patient denies fever, weight loss, chest pain, syncope, dyspnea on exertion, peripheral edema, melena, hematochezia, hematuria, and difficulty walking.         See HPI.  Rest of ROS neg.  Physical Exam  General:  alert, well-developed, and well-nourished.   Head:  normocephalic and atraumatic.   Eyes:  pupils equal, pupils round, pupils reactive to light,  and no retinal abnormalitiies.   Ears:  L Cerumen impaction.   R TM ok but with mod amount of wax  Nose:  no external deformity.   Mouth:  pharynx pink and moist, no erythema, and no exudates.   Neck:  supple, no thyromegaly, no JVD, no carotid bruits, and no cervical lymphadenopathy.   Chest Wall:  no deformities.   Lungs:  normal breath sounds, no crackles, and no wheezes.   Heart:  normal rate, regular rhythm, no murmur, and no gallop.   Abdomen:  soft, non-tender, normal bowel sounds, and no hepatomegaly.   Rectal:  no external abnormalities, no hemorrhoids, normal sphincter tone, no masses, no tenderness, no fissures, no fistulae, and no perianal rash.   Genitalia:  circumcised, no  hydrocele, no varicocele, no scrotal masses, no testicular masses or atrophy, no cutaneous lesions, and no urethral discharge.   Prostate:  no gland enlargement, no nodules, no asymmetry, and no induration.   Msk:  normal ROM.   Pulses:  DP/PT mildly diminished bilat FA pulses 1+; no bruit Extremities:  no edema Neurologic:  alert & oriented X3, cranial nerves II-XII intact, strength normal in all extremities, gait normal, and DTRs symmetrical and normal.   Skin:  turgor normal.   Psych:  normally interactive and good eye contact.     Impression & Recommendations:  Problem # 1:  Preventive Health Care (ICD-V70.0)  discussed colo and he wants to proceed discussed implications and indications for PSA and he wants to proceed flu shot done this year other vaccines up to date refuses HIV today  Orders: T-PSA (60454-09811) Hemoccult Cards -3 specimans (take home) (91478) Hemoccult Guaiac-1 spec.(in office) (82270) T-Urinalysis (29562-13086) Gastroenterology Referral (GI)  Problem # 2:  DEPRESSION (ICD-311) advised him to refill celexa f/u with LCSW today  His updated medication list for this problem includes:    Celexa 20 Mg Tabs (Citalopram hydrobromide) .[name]... Take 1 tablet by mouth once a  day  Problem # 3:  PALPITATIONS (ICD-785.1)  chronic check ecg recent ECHO with Normal LVF prob PVCs or PACs continue coreg if continues, f/u with Dr. [name] for further eval  His updated medication list for this problem includes:    Coreg 25 Mg Tabs (Carvedilol) .[name]... 1 tablet by mouth two times a day  Orders: EKG w/ Interpretation (93000)  Problem # 4:  LEG PAIN (ICD-729.5)  notes leg heaviness with walking bilat and at bedtime improves with rest DP/PT mildly diminished bilat certainly at risk for PAD will get ABIs  Orders: LE Arterial Doppler/ABI (LE arterial doppler)  Problem # 5:  ESSENTIAL HYPERTENSION, BENIGN (ICD-401.1) controlled  His updated medication list for this problem includes:    Coreg 25 Mg Tabs (Carvedilol) .[name]... 1 tablet by mouth two times a day    Norvasc 10 Mg Tabs (Amlodipine besylate) .[name]... Take 1 tablet by mouth every morning    Benazepril Hcl 40 Mg Tabs (Benazepril hcl) .[name]... Take 1 tablet by mouth every morning    Lasix 40 Mg Tabs (Furosemide) .[name]... Take 1 tablet by mouth once a day    Clonidine Hcl 0.2 Mg Tabs (Clonidine hcl) .[name]... Take 1 tablet by mouth two times a day    Cozaar 100 Mg Tabs (Losartan potassium) .[name]... 1 tab by mouth daily in place of avapro  Orders: T-Comprehensive Metabolic Panel ([id number]) T-Urinalysis (28413-24401)  Problem # 6:  DYSLIPIDEMIA (ICD-272.4) check lipids today goal LDL < 70  His updated medication list for this problem includes:    Pravastatin Sodium 80 Mg Tabs (Pravastatin sodium) .[name]... Take 1 tablet by mouth once a day for cholesterol  Orders: T-Comprehensive Metabolic Panel ([id number]) T-Lipid Profile (03474-25956)  Problem # 7:  CORONARY ARTERY DISEASE (ICD-414.00) f/u with Card as scheduled  His updated medication list for this problem includes:    Coreg 25 Mg Tabs (Carvedilol) .[name]... 1 tablet by mouth two times a day    Plavix 75 Mg Tabs (Clopidogrel bisulfate) .[name]... Take 1 tablet by  mouth daily    Norvasc 10 Mg Tabs (Amlodipine besylate) .[name]... Take 1 tablet by mouth every morning    Benazepril Hcl 40 Mg Tabs (Benazepril hcl) .[name]... Take 1 tablet by mouth every morning    Lasix 40 Mg Tabs (Furosemide) .[name]... Take  1 tablet by mouth once a day    Clonidine Hcl 0.2 Mg Tabs (Clonidine hcl) .[name]... Take 1 tablet by mouth two times a day    Nitroglycerin 0.4 Mg Subl (Nitroglycerin) .[name]... 1 tab under tongue as needed. may repeat q 5 minutes up to 3 doses if chest pain not relieved.    Cozaar 100 Mg Tabs (Losartan potassium) .[name]... 1 tab by mouth daily in place of avapro    Aspir-low 81 Mg Tbec (Aspirin) .[name]... Take 1 tablet by mouth once a day  Problem # 8:  IMPACTED CERUMEN (ICD-380.4)  likely cause for tinnitus irrigation today f/u if no resolution  Orders: Cerumen Impaction Removal (62831)  Problem # 9:  PROTEINURIA (ICD-791.0)  100mg /dL protein noted on U/A may have early CKD from HTN schedule 24 hour urine for protein and creat clearance if abnormal get u/s, spep, upep  Orders: T-Urine 24 Hr. Creatinine Clearance [phone]) T-Urine 24 Hr. Protein ([phone])  Complete Medication List: 1)  Coreg 25 Mg Tabs (Carvedilol) .[name].. 1 tablet by mouth two times a day 2)  Plavix 75 Mg Tabs (Clopidogrel bisulfate) .... Take 1 tablet by mouth daily 3)  Norvasc 10 Mg Tabs (Amlodipine besylate) .... Take 1 tablet by mouth every morning 4)  Benazepril Hcl 40 Mg Tabs (Benazepril hcl) .... Take 1 tablet by mouth every morning 5)  Lasix 40 Mg Tabs (Furosemide) .... Take 1 tablet by mouth once a day 6)  Potassium Chloride Crys Cr 20 Meq Tbcr (Potassium chloride crys cr) .... Take 1 tablet by mouth daily 7)  Clonidine Hcl 0.2 Mg Tabs (Clonidine hcl) .... Take 1 tablet by mouth two times a day 8)  Nitroglycerin 0.4 Mg Subl (Nitroglycerin) .[name].. 1 tab under tongue as needed. may repeat q 5 minutes up to 3 doses if chest pain not relieved. 9)  Pravastatin Sodium 80 Mg Tabs (Pravastatin  sodium) .... Take 1 tablet by mouth once a day for cholesterol 10)  Famotidine 40 Mg Tabs (Famotidine) .... Take 1 tablet by mouth every 12 hours for stomach 11)  Cozaar 100 Mg Tabs (Losartan potassium) .[name].. 1 tab by mouth daily in place of avapro 12)  Cpap Equipment  .... Titrate to 17cwp,ahi 0 per hr. may try resmed mirage quattro full-face mask with heated humidifier 13)  Celexa 20 Mg Tabs (Citalopram hydrobromide) .... Take 1 tablet by mouth once a day 14)  Aspir-low 81 Mg Tbec (Aspirin) .... Take 1 tablet by mouth once a day  Hypertension Assessment/Plan:      The patient's hypertensive risk group is category C: Target organ damage and/or diabetes.  Today's blood pressure is 123/81.  His blood pressure goal is < 140/90.   Patient Instructions: 1)  Please schedule a follow-up appointment in 3 months with Gabreal Worton for blood pressure and depression. 2)  Return sooner if your ringing in your ears does not resolve. 3)  Schedule sooner follow up with Dr. [name] if your palpitations get worse. 4)  Tobacco is very bad for your health and your loved ones ! You should stop smoking !  5)  Stop smoking tips: Choose a quit date. Cut down before the quit date. Decide what you will do as a substitute when you feel the urge to smoke(gum, toothpick, exercise).  6)  It is important that you exercise reguarly at least 20 minutes 5 times a week. If you develop chest pain, have severe difficulty breathing, or feel very tired, stop exercising immediately and seek medical attention.  Walking is the best thing you can do for yourself.  Laboratory Results   Urine Tests  Date/Time Received: August 23, 2009 10:17 AM   Routine Urinalysis   Glucose: negative   (Normal Range: Negative) Bilirubin: negative   (Normal Range: Negative) Ketone: negative   (Normal Range: Negative) Spec. Gravity: >=1.030   (Normal Range: 1.003-1.035) Blood: negative   (Normal Range: Negative) pH: 6.0   (Normal Range: 5.0-8.0) Protein:  100   (Normal Range: Negative) Urobilinogen: 0.2   (Normal Range: 0-1) Nitrite: negative   (Normal Range: Negative) Leukocyte Esterace: negative   (Normal Range: Negative)    Date/Time Received: August 23, 2009 10:32 AM  Date/Time Reported: August 23, 2009 10:32 AM   Other Tests  Rapid HIV: negative  Stool - Occult Blood Hemmoccult #1: negative Date: 08/23/2009     Laboratory Results   Urine Tests    Routine Urinalysis   Glucose: negative   (Normal Range: Negative) Bilirubin: negative   (Normal Range: Negative) Ketone: negative   (Normal Range: Negative) Spec. Gravity: >=1.030   (Normal Range: 1.003-1.035) Blood: negative   (Normal Range: Negative) pH: 6.0   (Normal Range: 5.0-8.0) Protein: 100   (Normal Range: Negative) Urobilinogen: 0.2   (Normal Range: 0-1) Nitrite: negative   (Normal Range: Negative) Leukocyte Esterace: negative   (Normal Range: Negative)      Other Tests  Rapid HIV: negative    EKG  Procedure date:  08/23/2009  Findings:      Sinus bradycardia with rate of:  53 normal axis IVCD LVH no ischemic changes inf Q waves

## 2010-07-11 NOTE — Assessment & Plan Note (Signed)
Summary: Acute - Prostatis   Vital Signs:  Patient profile:   55 year old male Weight:      321.5 pounds Temp:     98.4 degrees F oral Pulse rate:   62 / minute Pulse rhythm:   regular Resp:     16 per minute BP sitting:   185 / 105  (left arm) Cuff size:   large  Vitals Entered By: Levon Hedger (December 26, 2009 10:49 AM) CC: hemrrhoid pain, enlarged prostate from ER visit, Hypertension Management Is Patient Diabetic? No Pain Assessment Patient in pain? yes     Location: rectum  Does patient need assistance? Functional Status Self care Ambulation Normal   Primary Care Provider:  Tereso Newcomer, PA-C  CC:  hemrrhoid pain, enlarged prostate from ER visit, and Hypertension Management.  History of Present Illness:  Pt into the office following ER visit on 12/23/2009 for rectal pain Pt also was having problems with voiding. Recent colonscopy done at [facility] in 09/2009 Hx of hemorrhoids and rectal bleeding has started over the weekend.  He has been using the proctozone as ordered. Pt was also started on cipro 500mg  two times a day x 14 days. vicodin as needed for pain PSA done in 08/2009 normal. Prostate enlarged and tender during exam in ER. Pt has already scheduled an appointment with Urology on July 29th.  Cardiology - pt missed last cardiology appt and was rescheduled on August 18th.     Pt presents today with ALL his medications  Hypertension History:      PT DID NOT TAKE HIS BLOOD PRESSURE MEDICATIONS TODAY.        Positive major cardiovascular risk factors include male age 40 years old or older, hyperlipidemia, hypertension, and current tobacco user.  Negative major cardiovascular risk factors include negative family history for ischemic heart disease.        Positive history for target organ damage include ASHD (either angina/prior MI/prior CABG).     Medications Prior to Update: 1)  Coreg 25 Mg  Tabs (Carvedilol) .[name].. 1 Tablet By Mouth Two Times A Day 2)  Plavix  75 Mg  Tabs (Clopidogrel Bisulfate) .... Take 1 Tablet By Mouth Daily 3)  Norvasc 10 Mg  Tabs (Amlodipine Besylate) .... Take 1 Tablet By Mouth Every Morning 4)  Benazepril Hcl 40 Mg  Tabs (Benazepril Hcl) .... Take 1 Tablet By Mouth Every Morning 5)  Lasix 40 Mg  Tabs (Furosemide) .... Take 1 Tablet By Mouth Once A Day 6)  Potassium Chloride Crys Cr 20 Meq  Tbcr (Potassium Chloride Crys Cr) .... Take 1 Tablet By Mouth Daily 7)  Clonidine Hcl 0.2 Mg Tabs (Clonidine Hcl) .... Take 1 Tablet By Mouth Two Times A Day 8)  Nitroglycerin 0.4 Mg  Subl (Nitroglycerin) .[name].. 1 Tab Under Tongue As Needed. May Repeat Q 5 Minutes Up To 3 Doses If Chest Pain Not Relieved. 9)  Simvastatin 40 Mg Tabs (Simvastatin) .... Take 1 Tab By Mouth At Bedtime For Cholesterol 10)  Famotidine 40 Mg Tabs (Famotidine) .... Take 1 Tablet By Mouth Every 12 Hours For Stomach 11)  Cozaar 100 Mg Tabs (Losartan Potassium) .[name].. 1 Tab By Mouth Daily in Place of Avapro 12)  Cpap Equipment .... Titrate To 17cwp,ahi 0 Per Hr. May Try Resmed Mirage Quattro Full-Face Mask With Heated Humidifier 13)  Celexa 40 Mg Tabs (Citalopram Hydrobromide) .... Take 1 Tablet By Mouth Once A Day 14)  Aspir-Low 81 Mg Tbec (Aspirin) .... Take 1 Tablet  By Mouth Once A Day 15)  Trazodone Hcl 50 Mg Tabs (Trazodone Hcl) .... Take 1 Tab By Mouth At Bedtime As Needed For Sleep 16)  Flonase 50 Mcg/act Susp (Fluticasone Propionate) .... 2 Sprays Each Nostril For One Month  Allergies (verified): 1)  ! Penicillin  Review of Systems CV:  Denies chest pain or discomfort. Resp:  Denies cough. GI:  Complains of bloody stools and hemorrhoids; reports that he did eat something that irritated his stomach that caused diarrhea.  This may have been a trigger for his hemorrhoids as this has been a known dx but not recently affected. GU:  Complains of nocturia and urinary frequency; denies dysuria and hematuria.  Physical Exam  General:  alert.   Head:  normocephalic.     Prostate:  deferred - done in ER Msk:  active - up to exam table Neurologic:  alert & oriented X3.   Skin:  color normal.   Psych:  Oriented X3.     Impression & Recommendations:  Problem # 1:  ACUTE PROSTATITIS (ICD-601.0) handout given to pt pt has already started on cipro and advised to continue current course he has a f/u appt with urology on next week.  Pt instructed to keep this appt  Problem # 2:  HEMORRHOIDS (ICD-455.6) most likely triggered by diarrhea advised pt to use proctofoam as ordered  Complete Medication List: 1)  Coreg 25 Mg Tabs (Carvedilol) .[name].. 1 tablet by mouth two times a day 2)  Plavix 75 Mg Tabs (Clopidogrel bisulfate) .... Take 1 tablet by mouth daily 3)  Norvasc 10 Mg Tabs (Amlodipine besylate) .... Take 1 tablet by mouth every morning 4)  Benazepril Hcl 40 Mg Tabs (Benazepril hcl) .... Take 1 tablet by mouth every morning 5)  Lasix 40 Mg Tabs (Furosemide) .... Take 1 tablet by mouth once a day 6)  Potassium Chloride Crys Cr 20 Meq Tbcr (Potassium chloride crys cr) .... Take 1 tablet by mouth daily 7)  Clonidine Hcl 0.2 Mg Tabs (Clonidine hcl) .... Take 1 tablet by mouth two times a day 8)  Nitroglycerin 0.4 Mg Subl (Nitroglycerin) .[name].. 1 tab under tongue as needed. may repeat q 5 minutes up to 3 doses if chest pain not relieved. 9)  Simvastatin 40 Mg Tabs (Simvastatin) .... Take 1 tab by mouth at bedtime for cholesterol 10)  Famotidine 40 Mg Tabs (Famotidine) .... Take 1 tablet by mouth every 12 hours for stomach 11)  Cozaar 100 Mg Tabs (Losartan potassium) .[name].. 1 tab by mouth daily in place of avapro 12)  Cpap Equipment  .... Titrate to 17cwp,ahi 0 per hr. may try resmed mirage quattro full-face mask with heated humidifier 13)  Celexa 40 Mg Tabs (Citalopram hydrobromide) .... Take 1 tablet by mouth once a day 14)  Aspir-low 81 Mg Tbec (Aspirin) .... Take 1 tablet by mouth once a day 15)  Trazodone Hcl 50 Mg Tabs (Trazodone hcl) .... Take 1 tab by mouth  at bedtime as needed for sleep 16)  Flonase 50 Mcg/act Susp (Fluticasone propionate) .... 2 sprays each nostril for one month  Hypertension Assessment/Plan:      The patient's hypertensive risk group is category C: Target organ damage and/or diabetes.  Today's blood pressure is 185/105.  His blood pressure goal is < 140/90.  Patient Instructions: 1)  Take all the Cipro as ordered 2)  Keep your appointment with the Urologist as ordered 3)  Read handout about prostatitis

## 2010-07-11 NOTE — Letter (Signed)
Summary: *HSN Results Follow up  [facility]  [address] [place], [place] [address]   Phone: [phone]  Fax: [phone]      08/05/2009   [name] [name] [address] [place], [place]  [address]   Dear  Mr. Derwin Tussey,                            ____S.Drinkard,FNP   ____D. Gore,FNP       ____B. McPherson,MD   ____V. Rankins,MD    ____E. Mulberry,MD    ____N. Daphine Deutscher, FNP  ____D. Reche Dixon, MD    ____K. Philipp Deputy, MD    __x__S. Alben Spittle, PA-C     This letter is to inform you that your recent test(s):  _______Pap Smear    ___x____Lab Test     _______X-ray    ___x____ is within acceptable limits  _______ requires a medication change  _______ requires a follow-up lab visit  _______ requires a follow-up visit with your provider   Comments: Repeat lab looks much better.  Kidney function looks good.  You may have been a little dehydrated.  Continue your current medications.       _________________________________________________________ If you have any questions, please contact our office                     Sincerely,  Tereso Newcomer PA-C [facility]

## 2010-07-11 NOTE — Progress Notes (Signed)
Summary: Results  Phone Note Call from Patient   Caller: Patient Summary of Call: I spoke with the pt and reviewed the results of his myoview.  I scheduled the pt for an echo on 03/07/10 at 2:00. Initial call taken by: Julieta Gutting, RN, BSN,  February 20, 2010 3:16 PM

## 2010-07-11 NOTE — Miscellaneous (Signed)
Summary: Flu vaccine   Clinical Lists Changes  Observations: Added new observation of FLU VAX: given at [facility] (04/12/2010 15:36)

## 2010-07-11 NOTE — Letter (Signed)
Summary: SCAT ELIGIBILITY  APPLICIATION  SCAT ELIGIBILITY  APPLICIATION   Imported By: Arta Bruce 02/23/2010 15:27:06  _____________________________________________________________________  External Attachment:    Type:   Image     Comment:   External Document

## 2010-07-11 NOTE — Medication Information (Signed)
Summary: [facility]  [facility]CO   Imported By: Arta Bruce 08/09/2009 15:40:43  _____________________________________________________________________  External Attachment:    Type:   Image     Comment:   External Document

## 2010-07-11 NOTE — Progress Notes (Signed)
Summary: ENLARGED PROSTATE/HEMROIDS   Phone Note Call from Patient Call back at Home Phone [phone]   Summary of Call: [name] [name] PT. MR Enge WENT TO [facility] YESTERDAY FOR HIS HEMROIDS, AND THEY ALSO FOUND OUT THAT HIS PROSTATE IS ENLARGED AND HE COULDN'T NOT URINATE. THEY GAVE HIM A ANTIBIOTIC AND SOME PAIN MEDICATION. HE IS STILL IN PAIN AND CAN FEEL THE PRESSURE COMING BACK, AND HE IS UNCOMFORTABLE, AND THEY WANT HIM TO FOLOW UP. I OFFERED THE 27, BUT HE CANT WAIT THAT LONG. Initial call taken by: Leodis Rains,  December 22, 2009 3:45 PM  Follow-up for Phone Call        Discussed with Jesse Fall -- advised pt. to go to urgent care since he is only able to "dribble" urine and is having pressure and pain.  Advised a catheter may be necessary for short term; instructed to call this office to update if a catheter is place and for follow-up.   Follow-up by: Dutch Quint RN,  December 22, 2009 4:10 PM  Additional Follow-up for Phone Call Additional follow up Details #1::        Left message on answering machine to return call.  Dutch Quint RN  December 23, 2009 10:26 AM     Additional Follow-up for Phone Call Additional follow up Details #2::    In office to see provider.  Follow-up by: Dutch Quint RN,  December 26, 2009 11:05 AM

## 2010-07-11 NOTE — Miscellaneous (Signed)
  Clinical Lists Changes  Observations: Added new observation of PAST MED HX:  SMOKER (ICD-305.1) PERCUTANEOUS TRANSLUMINAL CORONARY ANGIOPLASTY, HX OF (ICD-V45.82) GASTROESOPHAGEAL REFLUX DISEASE (ICD-530.81) DYSLIPIDEMIA (ICD-272.4) HYPERTENSION, HX OF (ICD-V12.50)      a.  Moderate LVH on echo 03/2009 CORONARY ARTERY DISEASE (ICD-414.00)      a.  h/o stent to RCA and 1st PLB      b.  cath in 2008:  80% in stent restenosis Rx'd with PTCA (cutting balloon)      c.  EF 55-60% at cath 2008      d.  myoview 01/2010 with inf scar with mild peri-infarct ischemia, EF 36%      e.  Echo 02/2010: EF 55-60%; Inf AK (no change from previous study); mod-severe LVH; PAP ISCHEMIC CARDIOMYOPATHY, LVEF 40%      a.  EF improved to 55% on Echo 03/2009 with inferior akinesis 24 hour urine 08/2009: cr. clearance high; protein 650 mg Colo 09/2009 . . . needs repeat 2013 (03/09/2010 14:02)       Past History:  Past Medical History:  SMOKER (ICD-305.1) PERCUTANEOUS TRANSLUMINAL CORONARY ANGIOPLASTY, HX OF (ICD-V45.82) GASTROESOPHAGEAL REFLUX DISEASE (ICD-530.81) DYSLIPIDEMIA (ICD-272.4) HYPERTENSION, HX OF (ICD-V12.50)      a.  Moderate LVH on echo 03/2009 CORONARY ARTERY DISEASE (ICD-414.00)      a.  h/o stent to RCA and 1st PLB      b.  cath in 2008:  80% in stent restenosis Rx'd with PTCA (cutting balloon)      c.  EF 55-60% at cath 2008      d.  myoview 01/2010 with inf scar with mild peri-infarct ischemia, EF 36%      e.  Echo 02/2010: EF 55-60%; Inf AK (no change from previous study); mod-severe LVH; PAP ISCHEMIC CARDIOMYOPATHY, LVEF 40%      a.  EF improved to 55% on Echo 03/2009 with inferior akinesis 24 hour urine 08/2009: cr. clearance high; protein 650 mg Colo 09/2009 . . . needs repeat 2013

## 2010-07-11 NOTE — Letter (Signed)
Summary: Handout Printed  Printed Handout:  - Prostatitis

## 2010-07-11 NOTE — Letter (Signed)
Summary: *HSN Results Follow up  [facility]  [address] [place], [place] [address]   Phone: [phone]  Fax: [phone]      11/30/2009   [name] [name] [address] [place], [place]  [address]   Dear  [name],                            ____S.Drinkard,FNP   ____D. Gore,FNP       ____B. McPherson,MD   ____V. Rankins,MD    ____E. Mulberry,MD    ____N. Daphine Deutscher, FNP  ____D. Reche Dixon, MD    ____K. Philipp Deputy, MD    __x__S. Alben Spittle, PA-C    This letter is to inform you that your recent test(s):  _______Pap Smear    ___x____Lab Test     _______X-ray    ___x____ is within acceptable limits  _______ requires a medication change  _______ requires a follow-up lab visit  _______ requires a follow-up visit with your provider   Comments:       _________________________________________________________ If you have any questions, please contact our office                     Sincerely,  Tereso Newcomer PA-C [facility]

## 2010-07-11 NOTE — Progress Notes (Signed)
Summary: Home Health Referral   Phone Note Outgoing Call   Summary of Call: Refer to Home Health.  Needs renewal so they will start maintaining his CPAP again. Initial call taken by: Brynda Rim,  November 28, 2009 12:11 PM

## 2010-07-11 NOTE — Letter (Signed)
Summary: [facility]   [facility]   Imported By: Arta Bruce 02/06/2010 09:50:17  _____________________________________________________________________  External Attachment:    Type:   Image     Comment:   External Document

## 2010-07-11 NOTE — Progress Notes (Signed)
Summary: Query:  Refill clonidine?  Phone Note Outgoing Call   Summary of Call: Refill clonidine?  Last seen 12/2009. Initial call taken by: Dutch Quint RN,  May 18, 2010 6:40 PM    Prescriptions: CLONIDINE HCL 0.2 MG TABS (CLONIDINE HCL) Take 1 tablet by mouth two times a day  #60 x 5   Entered and Authorized by:   Lehman Prom FNP   Signed by:   Lehman Prom FNP on 05/18/2010   Method used:   Electronically to        [facility] [address] [phone]* (retail)       [address] [place], [place]  [address]       Ph: [phone]       Fax: [phone]   RxID:   [id number]

## 2010-07-11 NOTE — Assessment & Plan Note (Signed)
Summary: f/u app in 2 wks with [name] [name] for 311//gk   Vital Signs:  Patient profile:   55 year old male Height:      74.25 inches Weight:      316 pounds BMI:     40.44 Temp:     98.4 degrees F oral Pulse rate:   51 / minute Pulse rhythm:   regular Resp:     18 per minute BP sitting:   118 / 78  (left arm) Cuff size:   large  Vitals Entered By: [name] [name] (August 04, 2009 8:47 AM) CC: f/u on depression...Marland KitchenMarland Kitchen pt says his body had to get use to it but now he feels great... Is Patient Diabetic? No Pain Assessment Patient in pain? no       Does patient need assistance? Functional Status Self care Ambulation Normal   [facility]  CC:  f/u on depression...Marland KitchenMarland Kitchen pt says his body had to get use to it but now he feels great....  History of Present Illness: Here for f/u on depression. Started celexa last time. He had some diarrhea at first and some nausea.  This is better. No dizziness.  No hallucinations or agitation. He feels like his mood is improved. No suicidal ideations.   Allergies: 1)  ! Penicillin  Physical Exam  General:  alert, well-developed, and well-nourished.   Head:  normocephalic and atraumatic.   Lungs:  normal breath sounds.   Heart:  normal rate and regular rhythm.   Neurologic:  alert & oriented X3 and cranial nerves II-XII intact.   Psych:  normally interactive.     Impression & Recommendations:  Problem # 1:  DEPRESSION (ICD-311) some side effects at first to celexa getting better mood improved no suicidal ideations schedule to see LCSW next week f/u with me at CPE  His updated medication list for this problem includes:    Celexa 20 Mg Tabs (Citalopram hydrobromide) .[name]... Take 1 tablet by mouth once a day  Problem # 2:  NONSPECIFIC ABNORM RESULTS KIDNEY FUNCTION STUDY (ICD-794.4)  creat up at last lab check recheck bmet today ? if could pull back on or d/c lasix had EF of 40% at one time but echo in 03/2009  with normal LVF no vol overload on exam he notes that he does not drink a lot of water asked him to increase fluid intake some  Orders: T-Basic Metabolic Panel [phone])  Complete Medication List: 1)  Coreg 25 Mg Tabs (Carvedilol) .[name].. 1 tablet by mouth two times a day 2)  Plavix 75 Mg Tabs (Clopidogrel bisulfate) .... Take 1 tablet by mouth daily 3)  Norvasc 10 Mg Tabs (Amlodipine besylate) .... Take 1 tablet by mouth every morning 4)  Benazepril Hcl 40 Mg Tabs (Benazepril hcl) .... Take 1 tablet by mouth every morning 5)  Lasix 40 Mg Tabs (Furosemide) .... Take 1 tablet by mouth once a day 6)  Potassium Chloride Crys Cr 20 Meq Tbcr (Potassium chloride crys cr) .... Take 1 tablet by mouth daily 7)  Clonidine Hcl 0.2 Mg Tabs (Clonidine hcl) .... Take 1 tablet by mouth two times a day 8)  Nitroglycerin 0.4 Mg Subl (Nitroglycerin) .[name].. 1 tab under tongue as needed. may repeat q 5 minutes up to 3 doses if chest pain not relieved. 9)  Pravastatin Sodium 80 Mg Tabs (Pravastatin sodium) .... Take 1 tablet by mouth once a day for cholesterol 10)  Famotidine 40 Mg Tabs (Famotidine) .... Take 1 tablet by  mouth every 12 hours for stomach 11)  Cozaar 100 Mg Tabs (Losartan potassium) .[name].. 1 tab by mouth daily in place of avapro 12)  Cpap Equipment  .... Titrate to 17cwp,ahi 0 per hr. may try resmed mirage quattro full-face mask with heated humidifier 13)  Celexa 20 Mg Tabs (Citalopram hydrobromide) .... Take 1 tablet by mouth once a day 14)  Aspir-low 81 Mg Tbec (Aspirin) .... Take 1 tablet by mouth once a day  Patient Instructions: 1)  Follow up with [name] [name] as scheduled for CPE. 2)  Keep appt with [name] [name]. 3)  Let me know if you have any more side effects to celexa.

## 2010-07-11 NOTE — Letter (Signed)
Summary: *HSN Results Follow up  [facility]  [address] [place], [place] [address]   Phone: [phone]  Fax: [phone]      09/02/2009   Levar Bartus [address] [place], [place]  [address]   Dear  Mr. Darnelle Forster,                            ____S.Drinkard,FNP   ____D. Gore,FNP       ____B. McPherson,MD   ____V. Rankins,MD    ____E. Mulberry,MD    ____N. Daphine Deutscher, FNP  ____D. Reche Dixon, MD    ____K. Philipp Deputy, MD    __x__S. Alben Spittle, PA-C    This letter is to inform you that your recent test(s):  _______Pap Smear    _______Lab Test     _______X-ray    _______ is within acceptable limits  _______ requires a medication change  _______ requires a follow-up lab visit  _______ requires a follow-up visit with your provider   Comments: Test on legs was normal (no evidence of blockages in your legs).       _________________________________________________________ If you have any questions, please contact our office                     Sincerely,  Tereso Newcomer PA-C [facility]

## 2010-07-11 NOTE — Assessment & Plan Note (Signed)
Summary: ROV/TIA/SOB  Medications Added TAMSULOSIN HCL 0.4 MG CAPS (TAMSULOSIN HCL) Take 1 capsule by mouth once a day ASPIRIN 81 MG TBEC (ASPIRIN) Take one tablet by mouth daily        Visit Type:  Follow-up Primary Provider:  Tereso Newcomer, PA-C  CC:  palpitations.  History of Present Illness: 55 year-old male with CAD s/p PCI, morbid obesity, and hypertension. He returns today for follow-up evaluation. He reports palpitations at rest, worse when he is late on his medication. He denies chest pain, PND, orthopnea, or leg swelling. He denies lightheadedness or syncope. He has been inactive. He does admit to exertional dyspnea with low to moderate level activity. He continues to smoke cigarettes.  Current Medications (verified): 1)  Coreg 25 Mg  Tabs (Carvedilol) .[name].. 1 Tablet By Mouth Two Times A Day 2)  Plavix 75 Mg  Tabs (Clopidogrel Bisulfate) .... Take 1 Tablet By Mouth Daily 3)  Norvasc 10 Mg  Tabs (Amlodipine Besylate) .... Take 1 Tablet By Mouth Every Morning 4)  Benazepril Hcl 40 Mg  Tabs (Benazepril Hcl) .... Take 1 Tablet By Mouth Every Morning 5)  Lasix 40 Mg  Tabs (Furosemide) .... Take 1 Tablet By Mouth Once A Day 6)  Potassium Chloride Crys Cr 20 Meq  Tbcr (Potassium Chloride Crys Cr) .... Take 1 Tablet By Mouth Daily 7)  Clonidine Hcl 0.2 Mg Tabs (Clonidine Hcl) .... Take 1 Tablet By Mouth Two Times A Day 8)  Nitroglycerin 0.4 Mg  Subl (Nitroglycerin) .[name].. 1 Tab Under Tongue As Needed. May Repeat Q 5 Minutes Up To 3 Doses If Chest Pain Not Relieved. 9)  Simvastatin 40 Mg Tabs (Simvastatin) .... Take 1 Tab By Mouth At Bedtime For Cholesterol 10)  Famotidine 40 Mg Tabs (Famotidine) .... Take 1 Tablet By Mouth Every 12 Hours For Stomach 11)  Cozaar 100 Mg Tabs (Losartan Potassium) .[name].. 1 Tab By Mouth Daily in Place of Avapro 12)  Celexa 40 Mg Tabs (Citalopram Hydrobromide) .... Take 1 Tablet By Mouth Once A Day 13)  Flonase 50 Mcg/act Susp (Fluticasone Propionate) .... 2  Sprays Each Nostril For One Month 14)  Tamsulosin Hcl 0.4 Mg Caps (Tamsulosin Hcl) .... Take 1 Capsule By Mouth Once A Day  Allergies: 1)  ! Penicillin  Past History:  Past medical history reviewed for relevance to current acute and chronic problems.  Past Medical History: Reviewed history from 10/07/2009 and no changes required.  SMOKER (ICD-305.1) PERCUTANEOUS TRANSLUMINAL CORONARY ANGIOPLASTY, HX OF (ICD-V45.82) GASTROESOPHAGEAL REFLUX DISEASE (ICD-530.81) DYSLIPIDEMIA (ICD-272.4) HYPERTENSION, HX OF (ICD-V12.50)      a.  Moderate LVH on echo 03/2009 CORONARY ARTERY DISEASE (ICD-414.00)      a.  h/o stent to RCA and 1st PLB      b.  cath in 2008:  80% in stent restenosis Rx'd with PTCA (cutting balloon)      c.  EF 55-60% at cath 2008 ISCHEMIC CARDIOMYOPATHY, LVEF 40%      a.  EF improved to 55% on Echo 03/2009 with inferior akinesis 24 hour urine 08/2009: cr. clearance high; protein 650 mg Colo 09/2009 . . . needs repeat 2013  Review of Systems       Negative except as per HPI   Vital Signs:  Patient profile:   55 year old male Height:      74.25 inches Weight:      324.75 pounds BMI:     41.56 Pulse rate:   54 / minute Pulse rhythm:  regular Resp:     18 per minute BP sitting:   130 / 80  (left arm) Cuff size:   large  Vitals Entered By: Vikki Ports (January 26, 2010 3:14 PM)  Physical Exam  General:  Pt is alert and oriented, obese male, in no acute distress. HEENT: normal Neck: normal carotid upstrokes without bruits, JVP normal Lungs: CTA CV: RRR without murmur or gallop Abd: soft, NT, positive BS, no bruit, no organomegaly Ext: no clubbing, cyanosis, or edema. peripheral pulses 2+ and equal Skin: warm and dry without rash    EKG  Procedure date:  01/26/2010  Findings:      Sinus bradycardia 55 bpm, LVH, T wave abnormality - nonspecific.  Impression & Recommendations:  Problem # 1:  CORONARY ARTERY DISEASE (ICD-414.00) The patient has  recurrent exertional dyspnea. He has multiple possible etiologies...CAD, obesity, tobacco. However, he has presented with dyspnea as his anginal equivalent in the past. Favor Myoview stress to rule out significant inducible ischemia. Continue current medical therapy, but stressed the importance of daily ASA, which he has been taking sporadically.  The following medications were removed from the medication list:    Aspir-low 81 Mg Tbec (Aspirin) .[name]... Take 1 tablet by mouth once a day His updated medication list for this problem includes:    Coreg 25 Mg Tabs (Carvedilol) .[name]... 1 tablet by mouth two times a day    Plavix 75 Mg Tabs (Clopidogrel bisulfate) .[name]... Take 1 tablet by mouth daily    Norvasc 10 Mg Tabs (Amlodipine besylate) .[name]... Take 1 tablet by mouth every morning    Benazepril Hcl 40 Mg Tabs (Benazepril hcl) .[name]... Take 1 tablet by mouth every morning    Nitroglycerin 0.4 Mg Subl (Nitroglycerin) .[name]... 1 tab under tongue as needed. may repeat q 5 minutes up to 3 doses if chest pain not relieved.    Aspirin 81 Mg Tbec (Aspirin) .[name]... Take one tablet by mouth daily  Orders: EKG w/ Interpretation (93000) Nuclear Stress Test (Nuc Stress Test)  Problem # 2:  ESSENTIAL HYPERTENSION, BENIGN (ICD-401.1) BP well-controlled today.  The following medications were removed from the medication list:    Aspir-low 81 Mg Tbec (Aspirin) .[name]... Take 1 tablet by mouth once a day His updated medication list for this problem includes:    Coreg 25 Mg Tabs (Carvedilol) .[name]... 1 tablet by mouth two times a day    Norvasc 10 Mg Tabs (Amlodipine besylate) .[name]... Take 1 tablet by mouth every morning    Benazepril Hcl 40 Mg Tabs (Benazepril hcl) .[name]... Take 1 tablet by mouth every morning    Lasix 40 Mg Tabs (Furosemide) .[name]... Take 1 tablet by mouth once a day    Clonidine Hcl 0.2 Mg Tabs (Clonidine hcl) .[name]... Take 1 tablet by mouth two times a day    Cozaar 100 Mg Tabs (Losartan potassium) .[name]... 1 tab by mouth  daily in place of avapro    Aspirin 81 Mg Tbec (Aspirin) .[name]... Take one tablet by mouth daily  BP today: 130/80 Prior BP: 185/105 (12/26/2009)  Prior 10 Yr Risk Heart Disease: N/A (01/09/2008)  Labs Reviewed: K+: 4.0 (12/21/2009) Creat: : 1.4 (12/21/2009)   Chol: 146 (08/23/2009)   HDL: 50 (08/23/2009)   LDL: 87 (08/23/2009)   TG: 43 (08/23/2009)  Problem # 3:  DYSLIPIDEMIA (ICD-272.4) Lipids at goal. His updated medication list for this problem includes:    Simvastatin 40 Mg Tabs (Simvastatin) .[name]... Take 1 tab by mouth at bedtime for  cholesterol  CHOL: 146 (08/23/2009)   LDL: 87 (08/23/2009)   HDL: 50 (08/23/2009)   TG: 43 (08/23/2009)  Patient Instructions: 1)  Your physician has recommended you make the following change in your medication: START Aspirin 81mg  once a day 2)  Your physician wants you to follow-up in:  1 YEAR.  You will receive a reminder letter in the mail two months in advance. If you don't receive a letter, please call our office to schedule the follow-up appointment. 3)  Your physician has requested that you have a Lexiscan  myoview.  For further information please visit [email or web address]  Please follow instruction sheet, as given.

## 2010-07-11 NOTE — Progress Notes (Signed)
Summary: Office Visit//DEPRESSION SCREENING  Office Visit//DEPRESSION SCREENING   Imported By: Arta Bruce 10/21/2009 15:09:09  _____________________________________________________________________  External Attachment:    Type:   Image     Comment:   External Document

## 2010-07-11 NOTE — Miscellaneous (Signed)
Summary: Colonoscopy. . . needs repeat 2013   Clinical Lists Changes  Problems: Added new problem of COLONIC POLYPS (ICD-211.3) Observations: Added new observation of PAST MED HX:  SMOKER (ICD-305.1) PERCUTANEOUS TRANSLUMINAL CORONARY ANGIOPLASTY, HX OF (ICD-V45.82) GASTROESOPHAGEAL REFLUX DISEASE (ICD-530.81) DYSLIPIDEMIA (ICD-272.4) HYPERTENSION, HX OF (ICD-V12.50)      a.  Moderate LVH on echo 03/2009 CORONARY ARTERY DISEASE (ICD-414.00)      a.  h/o stent to RCA and 1st PLB      b.  cath in 2008:  80% in stent restenosis Rx'd with PTCA (cutting balloon)      c.  EF 55-60% at cath 2008 ISCHEMIC CARDIOMYOPATHY, LVEF 40%      a.  EF improved to 55% on Echo 03/2009 with inferior akinesis 24 hour urine 08/2009: cr. clearance high; protein 650 mg Colo 09/2009 . . . needs repeat 2013 (10/07/2009 16:30) Added new observation of COLONRECACT: Further recommendations pending biopsy results.  Repeat colonoscopy in 2 years.   (09/26/2009 16:32) Added new observation of COLONOSCOPY:  Results: Polyp.  Results: Hemorrhoids.     Location:  [facility].    "Non thrombosed external hemorrhoids. Multiple 3-6 mm polyps in the rectum, extensively biopsied/sampled. Two 4-7 mm polyps in the recto-sigmoid colon.  Resected and retrieved. Mild, internal hemorrhoids. Otherwise normal." (09/26/2009 16:32)      Colonoscopy  Procedure date:  09/26/2009  Findings:       Results: Polyp.  Results: Hemorrhoids.     Location:  [facility].    'Non thrombosed external hemorrhoids. Multiple 3-6 mm polyps in the rectum, extensively biopsied/sampled. Two 4-7 mm polyps in the recto-sigmoid colon.  Resected and retrieved. Mild, internal hemorrhoids. Otherwise normal.'  Comments:      Further recommendations pending biopsy results.  Repeat colonoscopy in 2 years.        Past History:  Past Medical History:  SMOKER (ICD-305.1) PERCUTANEOUS TRANSLUMINAL CORONARY ANGIOPLASTY, HX OF  (ICD-V45.82) GASTROESOPHAGEAL REFLUX DISEASE (ICD-530.81) DYSLIPIDEMIA (ICD-272.4) HYPERTENSION, HX OF (ICD-V12.50)      a.  Moderate LVH on echo 03/2009 CORONARY ARTERY DISEASE (ICD-414.00)      a.  h/o stent to RCA and 1st PLB      b.  cath in 2008:  80% in stent restenosis Rx'd with PTCA (cutting balloon)      c.  EF 55-60% at cath 2008 ISCHEMIC CARDIOMYOPATHY, LVEF 40%      a.  EF improved to 55% on Echo 03/2009 with inferior akinesis 24 hour urine 08/2009: cr. clearance high; protein 650 mg Colo 09/2009 . . . needs repeat 2013

## 2010-07-11 NOTE — Letter (Signed)
Summary: *HSN Results Follow up  [facility]  [address] [place], [place] [address]   Phone: [phone]  Fax: [phone]      02/15/2010   Teo Pierron [address] [place], [place]  [address]   Dear  Mr. Teegan Enderson,                            ____S.Drinkard,FNP   ____D. Gore,FNP       ____B. McPherson,MD   ____V. Rankins,MD    ____E. Mulberry,MD    ____N. Daphine Deutscher, FNP  ____D. Reche Dixon, MD    ____K. Philipp Deputy, MD    ____Other     This letter is to inform you that your recent test(s):  _______Pap Smear    _______Lab Test     _______X-ray    _______ is within acceptable limits  _______ requires a medication change  _______ requires a follow-up lab visit  _______ requires a follow-up visit with your provider   Comments:  We have been trying to reach you.  Please give the office a call at your earliest convenience.       _________________________________________________________ If you have any questions, please contact our office                     Sincerely,  [name]
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
## 2010-07-11 NOTE — Assessment & Plan Note (Signed)
Summary: 3 MONTH FU///KT   Vital Signs:  Patient profile:   55 year old male Height:      74.25 inches Weight:      320 pounds BMI:     40.96 Temp:     97.9 degrees F oral Pulse rate:   56 / minute Pulse rhythm:   regular Resp:     20 per minute BP sitting:   153 / 97  (left arm) Cuff size:   large  Vitals Entered By: [name] (November 28, 2009 11:00 AM) CC: three month f/u... pt has a knot on his right wrist.... pt says his ears are still tingling.., Hypertension Management Is Patient Diabetic? No Pain Assessment Patient in pain? no       Does patient need assistance? Functional Status Self care Ambulation Normal   Primary Care Provider:  Tereso Newcomer, PA-C  CC:  three month f/u... pt has a knot on his right wrist.... pt says his ears are still tingling.[name] and Hypertension Management.  History of Present Illness: Here for f/u.  Depression:  Mood is stagnant.  Thought Celexa was helping. Wants to know if he can go up on dose.  PHQ9=14.  Up from 10 last time.  No thoughts of suicide.  No longer seeing counselor.  Wants to go back.  Worried a lot about finances.  LIves by himself.  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  Wakes up a lot with sleep.    Dyspnea:  Notes worse with weather changes.  Notes long h/o cough.  Brings up sputum.  Does note some wheezing.  States he had asthma as a kid.  +DOE.  Notes worse over last few weeks.  Notes some chest tightness.  Resolves with rest.  Similar to previous pain but not as bad.  No pain in jaw but notes numbness in right hand at times.  No relation to exertion.  No nausea or diaphoresis.  No syncope. No rest symptoms.    Tinnitus:  Notes for several mos.  Getting worse.  Had cerumen impaction flushed last time.  No help.  No vertiginous symptoms.  Some hearing loss noted.  Notes some congestion.    Cyst on right wrist.  Somewhat painful at times.   Hypertension History:      no meds yet today.        Positive  major cardiovascular risk factors include male age 83 years old or older, hyperlipidemia, hypertension, and current tobacco user.  Negative major cardiovascular risk factors include negative family history for ischemic heart disease.        Positive history for target organ damage include ASHD (either angina/prior MI/prior CABG).     Problems Prior to Update: 1)  Ganglion Cyst, Wrist, Right  (ICD-727.41) 2)  Tinnitus  (ICD-388.30) 3)  Colonic Polyps  (ICD-211.3) 4)  Proteinuria  (ICD-791.0) 5)  Impacted Cerumen  (ICD-380.4) 6)  Leg Pain  (ICD-729.5) 7)  Palpitations  (ICD-785.1) 8)  Preventive Health Care  (ICD-V70.0) 9)  Nonspecific Abnorm Results Kidney Function Study  (ICD-794.4) 10)  Paresthesia  (ICD-782.0) 11)  Depression  (ICD-311) 12)  Family History Depression  (ICD-V17.0) 13)  Dyspnea  (ICD-786.05) 14)  Chest Pain Unspecified  (ICD-786.50) 15)  Obstructive Sleep Apnea  (ICD-327.23) 16)  Essential Hypertension, Benign  (ICD-401.1) 17)  Tinea Versicolor  (ICD-111.0) 18)  Need Prophylactic Vaccination&inoculation Flu  (ICD-V04.81) 19)  Scabies  (ICD-133.0) 20)  Atherosclerosis, Coronary, Native Artery  (ICD-414.01) 21)  Smoker  (ICD-305.1) 22)  Percutaneous Transluminal Coronary Angioplasty, Hx of  (ICD-V45.82) 23)  Gastroesophageal Reflux Disease  (ICD-530.81) 24)  Dyslipidemia  (ICD-272.4) 25)  Coronary Artery Disease  (ICD-414.00)  Current Medications (verified): 1)  Coreg 25 Mg  Tabs (Carvedilol) .[name].. 1 Tablet By Mouth Two Times A Day 2)  Plavix 75 Mg  Tabs (Clopidogrel Bisulfate) .... Take 1 Tablet By Mouth Daily 3)  Norvasc 10 Mg  Tabs (Amlodipine Besylate) .... Take 1 Tablet By Mouth Every Morning 4)  Benazepril Hcl 40 Mg  Tabs (Benazepril Hcl) .... Take 1 Tablet By Mouth Every Morning 5)  Lasix 40 Mg  Tabs (Furosemide) .... Take 1 Tablet By Mouth Once A Day 6)  Potassium Chloride Crys Cr 20 Meq  Tbcr (Potassium Chloride Crys Cr) .... Take 1 Tablet By Mouth  Daily 7)  Clonidine Hcl 0.2 Mg Tabs (Clonidine Hcl) .... Take 1 Tablet By Mouth Two Times A Day 8)  Nitroglycerin 0.4 Mg  Subl (Nitroglycerin) .[name].. 1 Tab Under Tongue As Needed. May Repeat Q 5 Minutes Up To 3 Doses If Chest Pain Not Relieved. 9)  Simvastatin 40 Mg Tabs (Simvastatin) .... Take 1 Tab By Mouth At Bedtime For Cholesterol 10)  Famotidine 40 Mg Tabs (Famotidine) .... Take 1 Tablet By Mouth Every 12 Hours For Stomach 11)  Cozaar 100 Mg Tabs (Losartan Potassium) .[name].. 1 Tab By Mouth Daily in Place of Avapro 12)  Cpap Equipment .... Titrate To 17cwp,ahi 0 Per Hr. May Try Resmed Mirage Quattro Full-Face Mask With Heated Humidifier 13)  Celexa 20 Mg Tabs (Citalopram Hydrobromide) .... Take 1 Tablet By Mouth Once A Day 14)  Aspir-Low 81 Mg Tbec (Aspirin) .... Take 1 Tablet By Mouth Once A Day  Allergies (verified): 1)  ! Penicillin  Past History:  Past Medical History: Last updated: 10/07/2009  SMOKER (ICD-305.1) PERCUTANEOUS TRANSLUMINAL CORONARY ANGIOPLASTY, HX OF (ICD-V45.82) GASTROESOPHAGEAL REFLUX DISEASE (ICD-530.81) DYSLIPIDEMIA (ICD-272.4) HYPERTENSION, HX OF (ICD-V12.50)      a.  Moderate LVH on echo 03/2009 CORONARY ARTERY DISEASE (ICD-414.00)      a.  h/o stent to RCA and 1st PLB      b.  cath in 2008:  80% in stent restenosis Rx'd with PTCA (cutting balloon)      c.  EF 55-60% at cath 2008 ISCHEMIC CARDIOMYOPATHY, LVEF 40%      a.  EF improved to 55% on Echo 03/2009 with inferior akinesis 24 hour urine 08/2009: cr. clearance high; protein 650 mg Colo 09/2009 . . . needs repeat 2013  Past Surgical History: Last updated: 08/30/2008 s/p cardiac stent x 2  Review of Systems      See HPI General:  Denies chills and fever. GI:  Denies bloody stools. GU:  Denies hematuria.  Physical Exam  General:  alert, well-developed, and well-nourished.   Head:  normocephalic and atraumatic.   Neck:  no jvd  Lungs:  normal breath sounds, no crackles, and no wheezes.    Heart:  normal rate, regular rhythm, and no murmur.   Abdomen:  soft, non-tender, and no hepatomegaly.   Msk:  ganglion cyst ventral right wrist  Extremities:  no edema  Neurologic:  alert & oriented X3 and cranial nerves II-XII intact.   Psych:  normally interactive.     Impression & Recommendations:  Problem # 1:  DEPRESSION (ICD-311) increase Celexa to 40 mg once daily  f/u with counselor add trazodone to use as needed   His updated medication list for this  problem includes:    Celexa 40 Mg Tabs (Citalopram hydrobromide) .[name]... Take 1 tablet by mouth once a day    Trazodone Hcl 50 Mg Tabs (Trazodone hcl) .[name]... Take 1 tab by mouth at bedtime as needed for sleep  Problem # 2:  DYSPNEA (ICD-786.05) with CAD, will get him to see Dr. [name] back for poss myoview do not see where he has had one in a while if cardiac w/u unremarkable, will proceed with PFTs and start him on Spiriva and Proventil discussed smoking cessation weight loss is important as well  Problem # 3:  ESSENTIAL HYPERTENSION, BENIGN (ICD-401.1) Assessment: Deteriorated  no meds today recheck with nurse in 2 week  His updated medication list for this problem includes:    Coreg 25 Mg Tabs (Carvedilol) .[name]... 1 tablet by mouth two times a day    Norvasc 10 Mg Tabs (Amlodipine besylate) .[name]... Take 1 tablet by mouth every morning    Benazepril Hcl 40 Mg Tabs (Benazepril hcl) .[name]... Take 1 tablet by mouth every morning    Lasix 40 Mg Tabs (Furosemide) .[name]... Take 1 tablet by mouth once a day    Clonidine Hcl 0.2 Mg Tabs (Clonidine hcl) .[name]... Take 1 tablet by mouth two times a day    Cozaar 100 Mg Tabs (Losartan potassium) .[name]... 1 tab by mouth daily in place of avapro  Orders: T-Basic Metabolic Panel [phone])  Problem # 4:  OBSTRUCTIVE SLEEP APNEA (ICD-327.23)  refer for CPAP maint to Home Health apparently has had a bill coming last several mos b/c he was not wearing enough states he will wear q night  now  Orders: Home Health Referral [facility])  Problem # 5:  ATHEROSCLEROSIS, CORONARY, NATIVE ARTERY (ICD-414.01) f/u with [facility] 12/09/209  His updated medication list for this problem includes:    Coreg 25 Mg Tabs (Carvedilol) .[name]... 1 tablet by mouth two times a day    Plavix 75 Mg Tabs (Clopidogrel bisulfate) .[name]... Take 1 tablet by mouth daily    Norvasc 10 Mg Tabs (Amlodipine besylate) .[name]... Take 1 tablet by mouth every morning    Benazepril Hcl 40 Mg Tabs (Benazepril hcl) .[name]... Take 1 tablet by mouth every morning    Lasix 40 Mg Tabs (Furosemide) .[name]... Take 1 tablet by mouth once a day    Clonidine Hcl 0.2 Mg Tabs (Clonidine hcl) .[name]... Take 1 tablet by mouth two times a day    Nitroglycerin 0.4 Mg Subl (Nitroglycerin) .[name]... 1 tab under tongue as needed. may repeat q 5 minutes up to 3 doses if chest pain not relieved.    Cozaar 100 Mg Tabs (Losartan potassium) .[name]... 1 tab by mouth daily in place of avapro    Aspir-low 81 Mg Tbec (Aspirin) .[name]... Take 1 tablet by mouth once a day  Problem # 6:  SMOKER (ICD-305.1) discussed cessation discussed poss switching celexa to wellbutrin he declines rec he set quit date and get nicotine patches no smoking with patches  Problem # 7:  GANGLION CYST, WRIST, RIGHT (ICD-727.41) refer to surgeon if patient desires declines at this point  Problem # 8:  TINNITUS (ICD-388.30) use flonase for one month to cover for poss eustachian tube dysfunction if no improvement, refer to ENT  Complete Medication List: 1)  Coreg 25 Mg Tabs (Carvedilol) .[name].. 1 tablet by mouth two times a day 2)  Plavix 75 Mg Tabs (Clopidogrel bisulfate) .... Take 1 tablet by mouth daily 3)  Norvasc 10 Mg Tabs (Amlodipine besylate) .Marland KitchenMarland KitchenMarland Kitchen  Take 1 tablet by mouth every morning 4)  Benazepril Hcl 40 Mg Tabs (Benazepril hcl) .... Take 1 tablet by mouth every morning 5)  Lasix 40 Mg Tabs (Furosemide) .... Take 1 tablet by mouth once a day 6)  Potassium Chloride Crys Cr 20 Meq Tbcr  (Potassium chloride crys cr) .... Take 1 tablet by mouth daily 7)  Clonidine Hcl 0.2 Mg Tabs (Clonidine hcl) .... Take 1 tablet by mouth two times a day 8)  Nitroglycerin 0.4 Mg Subl (Nitroglycerin) .[name].. 1 tab under tongue as needed. may repeat q 5 minutes up to 3 doses if chest pain not relieved. 9)  Simvastatin 40 Mg Tabs (Simvastatin) .... Take 1 tab by mouth at bedtime for cholesterol 10)  Famotidine 40 Mg Tabs (Famotidine) .... Take 1 tablet by mouth every 12 hours for stomach 11)  Cozaar 100 Mg Tabs (Losartan potassium) .[name].. 1 tab by mouth daily in place of avapro 12)  Cpap Equipment  .... Titrate to 17cwp,ahi 0 per hr. may try resmed mirage quattro full-face mask with heated humidifier 13)  Celexa 40 Mg Tabs (Citalopram hydrobromide) .... Take 1 tablet by mouth once a day 14)  Aspir-low 81 Mg Tbec (Aspirin) .... Take 1 tablet by mouth once a day 15)  Trazodone Hcl 50 Mg Tabs (Trazodone hcl) .... Take 1 tab by mouth at bedtime as needed for sleep 16)  Flonase 50 Mcg/act Susp (Fluticasone propionate) .... 2 sprays each nostril for one month  Hypertension Assessment/Plan:      The patient's hypertensive risk group is category C: Target organ damage and/or diabetes.  Today's blood pressure is 153/97.  His blood pressure goal is < 140/90.  Patient Instructions: 1)  Schedule follow up with [name]. 2)  Increase Celexa to 40 mg once daily.  You can take 2 tablets of the 20 mg tabs to make 40 mg. 3)  Try Benadryl 25 mg at bedtime as needed for sleep first. 4)  If this does not work, you can try the Trazodone. 5)  No sleep aid is intended to be used daily.  Only use as needed. 6)  Tobacco is very bad for your health and your loved ones ! You should stop smoking !  7)  Stop smoking tips: Choose a quit date. Cut down before the quit date. Decide what you will do as a substitute when you feel the urge to smoke(gum, toothpick, exercise).  8)  You can get Nicotine patches to use when you quit.   Start with 21 mg patches and wear daily for 4 weeks.  Then, got to 14 mg patches for 2-4 weeks.  Then, go to 7 mg patches for 2-3 weeks.  Then, stop.  DO NOT SMOKE WITH PATCHES ON. 9)  Schedule follow up with Dr. [name] due to increased shortness of breath. 10)  Use nitroglycerin as needed for chest pain. 11)  Keep taking Aspirin and Plavix. 12)  Go to the emergency room if your chest symptoms worsen or you have chest discomfort or shortness of breath at rest. 13)  Blood pressure check with the nurse in 2 weeks. 14)  Please schedule a follow-up appointment in 4-6 weeks with Scott.  Prescriptions: FLONASE 50 MCG/ACT SUSP (FLUTICASONE PROPIONATE) 2 sprays each nostril for one month  #1 mo supply x 0   Entered and Authorized by:   Tereso Newcomer PA-C   Signed by:   Tereso Newcomer PA-C on 11/28/2009   Method used:   Electronically to  [facility] [address] [phone]* (retail)       [address] [place], [place]  [address]       Ph: [phone]       Fax: [phone]   RxID:   [id number] CELEXA 40 MG TABS (CITALOPRAM HYDROBROMIDE) Take 1 tablet by mouth once a day  #90 x 1   Entered and Authorized by:   Tereso Newcomer PA-C   Signed by:   Tereso Newcomer PA-C on 11/28/2009   Method used:   Electronically to        [facility] [address] [phone]* (retail)       [address] [place], [place]  [address]       Ph: [phone]       Fax: [phone]   RxID:   [id number] FAMOTIDINE 40 MG TABS (FAMOTIDINE) Take 1 tablet by mouth every 12 hours for stomach  #180 x 3   Entered and Authorized by:   Tereso Newcomer PA-C   Signed by:   Tereso Newcomer PA-C on 11/28/2009   Method used:   Electronically to        [facility] [address] [phone]* (retail)       [address] [place], [place]  [address]       Ph: [phone]       Fax: [phone]   RxID:   [id number] TRAZODONE HCL 50 MG TABS (TRAZODONE HCL) Take 1 tab by mouth at bedtime as needed for sleep   #20 x 1   Entered and Authorized by:   Tereso Newcomer PA-C   Signed by:   Tereso Newcomer PA-C on 11/28/2009   Method used:   Electronically to        [facility] [address] [phone]* (retail)       [address] [place], [place]  [address]       Ph: [phone]       Fax: [phone]   RxID:   [id number]

## 2010-07-11 NOTE — Progress Notes (Signed)
Summary: Need to stop Plavix for a procedure   Phone Note From Other Clinic   Caller: [facility] Summary of Call: [facility] calling nedding the pt Plavix stop due to a procedure Initial call taken by: Judie Grieve,  September 16, 2009 4:38 PM  Follow-up for Phone Call        Spoke with sherri from [facility]. Pt is scheduled for Colonoscopy  on April 12/11. [facility] is requesting   authorization for pt. to be off Plavix for 3 to  4 days prior procedure. I let Sherri know Dr. [name] is not in the office at this time. MD needs to okay for pt. to be off Plavix. I will send message to Dr. [name] and his nurse desktop. Sherri stated procedure will be scheduled for later date.  Ollen Gross, RN, BSN  September 16, 2009 4:49 PM   Additional Follow-up for Phone Call Additional follow up Details #1::        ok to hold plavix prior to GI procedure for 5-7 days if needed Additional Follow-up by: Norva Karvonen, MD,  September 19, 2009 12:37 PM    Additional Follow-up for Phone Call Additional follow up Details #2::    [facility] [phone] spoke with Irving Burton at [facility] and she took the message that Mr Baria could hold Plavix 5-7 days as needed prior to his colonoscopy.  Follow-up by: Julieta Gutting, RN, BSN,  September 19, 2009 2:57 PM

## 2010-07-11 NOTE — Letter (Signed)
Summary: Results Follow-up  [facility], Main Office  [address]   [place], [place] [address]   Phone: [phone]  Fax: [phone]     February 14, 2010 MRN: [id number]   [name] [name] [address] [place], [place]  [address]   Dear [name],  We have received the results from your recent tests and have been unable to contact you.  We also need to speak with you about further cardiac testing recommended by Dr [name].   Please call our office at the number listed above so that Dr.  Earmon Phoenix nurse may review the results with you.    Thank you,  [facility]

## 2010-07-11 NOTE — Progress Notes (Signed)
   Phone Note Outgoing Call   Summary of Call: Not seen since Oct. Needs f/u visit  Initial call taken by: Brynda Rim,  June 28, 2009 8:27 AM  Follow-up for Phone Call        appt is scheduled Follow-up by: [name],  June 29, 2009 10:53 AM

## 2010-07-11 NOTE — Letter (Signed)
Summary: COLONOSCOPY REPORT  COLONOSCOPY REPORT   Imported By: Arta Bruce 10/10/2009 10:10:17  _____________________________________________________________________  External Attachment:    Type:   Image     Comment:   External Document

## 2010-07-11 NOTE — Letter (Signed)
Summary: *Referral Letter  [facility]  [address] [place], [place] [address]   Phone: [phone]  Fax: [phone]    01/18/2010  Regarding my patient:  Hazael Olveda [address] [place], [place]  [address] DOB February 23, 1956 Phone: [phone]  To whom it may concern:  Mr. Koral suffers from significant health problems including coronary artery disease or hardening of the arteries.  He would benefit from using SCAT as his mobility is limited from his health problems.  This problem is permanent.  Please consider him for SCAT eligibility.    Sincerely,    Tereso Newcomer, PA-C

## 2010-07-11 NOTE — Progress Notes (Signed)
Summary: Results--letter mailed  Phone Note Outgoing Call   Call placed by: Julieta Gutting, RN, BSN,  February 14, 2010 2:36 PM Call placed to: Patient Summary of Call: Customer out of service area.  I attempted to reach the pt about his myoview results.  Dr [name] would like the pt to have an ECHO to assess EF. Julieta Gutting, RN, BSN  February 14, 2010 2:37 PM  Customer out of service area or phone is turned off per message.  After reviewing the pt's chart it looks like we have not been able to reach the pt in the past.  I will mail a letter to the pt's home.  Initial call taken by: Julieta Gutting, RN, BSN,  February 14, 2010 5:04 PM

## 2010-08-25 ENCOUNTER — Telehealth: Payer: Self-pay | Admitting: Cardiovascular Disease

## 2010-08-27 LAB — POCT I-STAT, CHEM 8
HCT: 48 % (ref 39.0–52.0)
Hemoglobin: 16.3 g/dL (ref 13.0–17.0)
Potassium: 4 mEq/L (ref 3.5–5.1)
Sodium: 143 mEq/L (ref 135–145)

## 2010-08-27 LAB — URINALYSIS, ROUTINE W REFLEX MICROSCOPIC
Leukocytes, UA: NEGATIVE
Nitrite: NEGATIVE
Specific Gravity, Urine: 1.025 (ref 1.005–1.030)
pH: 5.5 (ref 5.0–8.0)

## 2010-08-27 LAB — URINE MICROSCOPIC-ADD ON

## 2010-08-27 LAB — URINE CULTURE
Colony Count: NO GROWTH
Culture: NO GROWTH

## 2010-08-29 ENCOUNTER — Telehealth (INDEPENDENT_AMBULATORY_CARE_PROVIDER_SITE_OTHER): Payer: Self-pay | Admitting: Nurse Practitioner

## 2010-08-29 NOTE — Progress Notes (Signed)
Summary: Change Statin  ---- Converted from flag ---- ---- 08/21/2010 5:01 AM, Norva Karvonen, MD wrote: would change to pravastatin 80 mg and draw lipids and lft's in 12 weeks.  ---- 08/18/2010 2:44 PM, Julieta Gutting, RN, BSN wrote: This pt is on Norvasc 10mg  and Simvastatin 40mg  ------------------------------  Phone Note Outgoing Call   Call placed by: Julieta Gutting, RN, BSN,  August 25, 2010 10:49 AM Call placed to: Patient Summary of Call: I spoke with the pt and made him aware that he needs to stop Simvastatin and start Pravastatin 80mg  at bedtime.  Rx sent to pharmacy. The pt will be due for lipid and liver in June.  Appt slip mailed to the pt for labwork.  Initial call taken by: Julieta Gutting, RN, BSN,  August 25, 2010 10:52 AM    New/Updated Medications: PRAVASTATIN SODIUM 80 MG TABS (PRAVASTATIN SODIUM) Take one tablet by mouth daily at bedtime Prescriptions: PRAVASTATIN SODIUM 80 MG TABS (PRAVASTATIN SODIUM) Take one tablet by mouth daily at bedtime  #30 x 8   Entered by:   Julieta Gutting, RN, BSN   Authorized by:   Norva Karvonen, MD   Signed by:   Julieta Gutting, RN, BSN on 08/25/2010   Method used:   Electronically to        [facility] [address] [phone]* (retail)       [address] [place], [place]  [address]       Ph: [phone]       Fax: [phone]   RxID:   [id number]

## 2010-09-07 NOTE — Progress Notes (Signed)
Summary: Query:  Refill citalopram?  Phone Note Outgoing Call   Summary of Call: Last seen 12/2009, no f/u scheduled.  Refill citalopram per protocol? Initial call taken by: Dutch Quint RN,  August 29, 2010 4:47 PM  Follow-up for Phone Call        refill done Follow-up by: Lehman Prom FNP,  August 29, 2010 4:50 PM

## 2010-09-08 ENCOUNTER — Encounter (INDEPENDENT_AMBULATORY_CARE_PROVIDER_SITE_OTHER): Payer: Self-pay | Admitting: Nurse Practitioner

## 2010-09-12 NOTE — Letter (Signed)
[facility]   Imported By: Arta Bruce 09/08/2010 11:42:46  _____________________________________________________________________  External Attachment:    Type:   Image     Comment:   External Document

## 2010-09-14 LAB — DIFFERENTIAL
Basophils Absolute: 0 10*3/uL (ref 0.0–0.1)
Basophils Relative: 1 % (ref 0–1)
Eosinophils Absolute: 0.1 10*3/uL (ref 0.0–0.7)
Lymphs Abs: 3.5 10*3/uL (ref 0.7–4.0)
Neutrophils Relative %: 37 % — ABNORMAL LOW (ref 43–77)

## 2010-09-14 LAB — CBC
HCT: 44.4 % (ref 39.0–52.0)
Hemoglobin: 14.8 g/dL (ref 13.0–17.0)
MCHC: 33.3 g/dL (ref 30.0–36.0)
MCV: 89 fL (ref 78.0–100.0)
Platelets: 180 10*3/uL (ref 150–400)
RBC: 4.99 MIL/uL (ref 4.22–5.81)
RDW: 16.3 % — ABNORMAL HIGH (ref 11.5–15.5)
WBC: 7.2 10*3/uL (ref 4.0–10.5)

## 2010-09-14 LAB — POCT CARDIAC MARKERS
CKMB, poc: 1 ng/mL (ref 1.0–8.0)
Myoglobin, poc: 81.6 ng/mL (ref 12–200)
Troponin i, poc: <0.05 ng/mL (ref 0.00–0.09)

## 2010-09-14 LAB — BASIC METABOLIC PANEL
BUN: 9 mg/dL (ref 6–23)
GFR calc non Af Amer: >60 mL/min (ref >60)
Potassium: 4 mEq/L (ref 3.5–5.1)
Sodium: 142 mEq/L (ref 135–145)

## 2010-09-14 LAB — TROPONIN I: Troponin I: 0.05 ng/mL (ref 0.00–0.06)

## 2010-09-14 LAB — CK TOTAL AND CKMB (NOT AT ARMC): Relative Index: 1 (ref 0.0–2.5)

## 2010-09-14 LAB — D-DIMER, QUANTITATIVE: D-Dimer, Quant: 0.28 ug/mL-FEU (ref 0.00–0.48)

## 2010-10-24 NOTE — Cardiovascular Report (Signed)
NAMEGAGANDEEP, PETTET              ACCOUNT NO.:  [id number]   MEDICAL RECORD NO.:  [id number]          PATIENT TYPE:  OIB   LOCATION:  6525                         FACILITY:  [facility]   PHYSICIAN:  [name]. [name], MD  DATE OF BIRTH:  [birth year]   DATE OF PROCEDURE:  DATE OF DISCHARGE:                            CARDIAC CATHETERIZATION   DATE OF PROCEDURE:  11/01/2006.   PROCEDURE:  Left heart catheterization, right heart catheterization,  selective coronary angiography, left ventricular angiography, cutting  balloon of the right coronary artery.  IVUS of the right coronary artery  and Star close of the right femoral artery.   INDICATIONS:  Mr. Liburd is a very nice 55 year old gentleman who I  evaluated in clinic last week.  He presented with a known history of  coronary artery disease as well as poorly controlled diabetes and  multiple cardiac risk factors.  He has classic exertional angina with  low level activity.  He was seen back in the clinic earlier this week  for increasing shortness of breath.  In that setting, I elected to  perform right and left heart catheterization to further evaluate his  symptoms, which are highly suggestive of a cardiac etiology.   Risks and indications of the procedure were explained to the patient.  Informed consent was obtained.  The right groin was prepped, draped and  anesthetized with 1% lidocaine.  Using modified Seldinger technique a 6-  French sheath was placed in the right femoral artery and a 6-French  sheath was placed in the right femoral vein without difficulty.  A right  heart catheterization was done with an end-hole multipurpose catheter.  Oxygen saturations were drawn from the superior vena cava, left  pulmonary artery and aorta.  Pressures were recorded from the right  atrium to the pulmonary capillary wedge position.  Cardiac output was  calculated via the Fick method.  Following the right heart  catheterization, selective  coronary angiography was performed with  standard Judkins catheters.  An angled pigtail catheter was then  inserted into the left ventricle and pressures were recorded.  Left  ventriculogram was performed.  A pullback across the aortic valve was  done.   At the conclusion of the diagnostic procedure, I elected to perform  intravascular ultrasound of the mid-right coronary artery.  The right  coronary artery was ectatic in its proximal portion with an area that  angiographically appeared like an ulcer or a focal aneurysm.  The mid-  right coronary artery which has been previously stented was suggestive  of moderate to high-grade focal restenosis.  Since Mr. Beeks has  exertional symptoms, I elected to perform IVUS on that area as a  possible source of ischemia.  Angiomax was used for anticoagulation.  Once a therapeutic ACT was achieved, a JR-4 guide catheter was inserted.  A cougar guidewire was passed beyond the lesion into the distal right  coronary artery and intravascular ultrasound was performed.  The IVUS of  the restenotic area in the midportion of the vessel showed a minimal  lumen area of 1.9 square millimeters.  I then elected to  perform cutting  balloon angioplasty of that area with a 3.0 x 10 mm cutting balloon  which was inflated four times up to 10 atmospheres.  Following cutting  balloon angioplasty there was a 30% residual stenosis.  I then elected  ti perform angioplasty with a noncompliant Quantum Maverick balloon.  I  used a 3.25 x 15-mm Quantum Maverick and it was inflated to 18  atmospheres.  Following noncompliant balloon dilatation there was 10%  residual stenosis.  At the conclusion of the procedure the sheath and  guidewire were removed and a Star close device was used to seal the  femoral arteriotomy.   FINDINGS:  Right atrial pressure 16 mmHg, right ventricular pressure  38/16,  pulmonary artery pressures 39/21 with a mean of 28.  Pulmonary  capillary wedge  pressures 16, left ventricular pressure is 139/26,  aortic pressure is 140/90 with a mean of 114.   Oxygen saturation in the superior vena cava is 58%.  Pulmonary artery  69%, aorta is 97%.  Fick cardiac output is 5 liters per minute.   Coronary angiography.  The left mainstem is angiographically normal.  It  bifurcates into the LAD and left circumflex.   The LAD is a large-caliber vessel that courses down the left ventricular  apex.  It is a diffusely diseased vessel.  The proximal portion of the  vessel has no significant obstructive disease.  The midportion of the  vessel has an area of 40-50% stenosis at worst.  The distal vessel is  diffusely diseased with diffuse nonobstructive plaque.  There is a large  first diagonal branch that has no significant angiographic disease.   The left circumflex is a large-caliber vessel.  There is some minor  nonobstructive plaque in the proximal portion.  There is some very small  obtuse marginal branches proximally.  Distally there are 2 left  posterolateral branches.  The first of which has a stented segment that  is widely patent with minimal restenosis.  The second posterolateral  branch has no significant angiographic disease.  The distal circumflex  beyond both posterolaterals has a 50% stenosis.   The right coronary artery is diffusely diseased through its midportion.  The entire midportion of the vessel is stented.  There is an area in the  proximal portion of the stent that appears aneurysmal.  It also it could  possibly be an occluded side branch.  The midportion of the right  coronary artery has a focal area that is slightly hazy and  angiographically appears to be 80% stenotic.  The distal vessel gives  off a PDA branch and an acute marginal branch.  There is no significant  disease in either of those branches.   Left ventricular function  assessed in the 30 degrees RAO projection shows normal LV systolic function with an estimated  EF of 55 to 60%.   ASSESSMENT:  1. High-grade in-stent restenosis in the right coronary artery.  2. Mild to moderate LAD stenosis.  3. Patent left circumflex stent  with mild to moderate nonobstructive      disease in the distal circumflex.  4. Normal left ventricular function.  5. Successful PTCA with cutting balloon and noncompliant balloon      angioplasty of the right coronary artery.  6. Mild pulmonary hypertension.   PLAN:  Mr. Harrison will need to continue with lifestyle modification and  risk factor changes.  He should continue with ongoing dual antiplatelet  therapy.      [name].  [name], MD  Electronically Signed     MDC/MEDQ  D:  11/01/2006  T:  11/01/2006  Job:  [id number]   cc:   Fanny Dance. Rankins, M.D.

## 2010-10-24 NOTE — Assessment & Plan Note (Signed)
[name]                            CARDIOLOGY OFFICE NOTE   [name], [name]                       MRN:          [id number]  DATE:10/30/2006                            DOB:          [birth year]    [name] [name] was seen as an outpatient at the [name] on Oct 30, 2006.  He is a 55 year old gentleman who I just saw on  Oct 23, 2006 for chest pain and exertional dyspnea.  He has known  coronary artery disease, and has been scheduled for cardiac  catheterization to be done later this week.  He called in yesterday  complaining of worsening shortness of breath with orthopnea and edema,  in spite of an increase in his Lasix dose.  I elected to bring him back  to clinic to evaluate how he is doing.   [name] [name] is feeling better today.  He continues to complain of some  shortness of breath with less activity.  After discussing his symptoms  with him, his orthopnea is chronic, and unchanged over time.  He is not  complaining of any leg edema at present, but did have some mild edema  yesterday.  He denies PND, lightheadedness, syncope, or any progression  of his chest discomfort.   CURRENT MEDICATIONS:  Include:  1. Avapro 300 mg daily.  2. Lipitor 80 mg daily.  3. Coreg 12.5 mg 2 twice daily.  4. Plavix 75 mg daily.  5. Norvasc 10 mg daily.  6. Isosorbide dinitrate 60 mg 2 daily.  7. Benazepril 40 mg daily.  8. Prilosec 40 mg daily.  9. Potassium chloride 20 mEq daily.  10.Protonix 40 mg daily.  11.Lasix 40 mg daily.  12.Aspirin 81 mg daily.   Of note, he was written a prescription for clonidine 0.1 mg twice daily,  but has not filled it yet.   ALLERGIES:  NO KNOWN DRUG ALLERGIES.   PHYSICAL EXAMINATION:  The patient is alert and oriented.  He is in no  acute distress.  Weight is 359 pounds.  Blood pressure is 158/98.  Heart rate 72.  Respiratory rate 16.  HEENT:  Normal.  NECK:  Jugular venous pressure is normal.  LUNGS:  Clear to auscultation bilaterally.  HEART:  Regular rate and rhythm with an S4 gallop.  There are no  murmurs.  ABDOMEN:  Soft, obese, and non-tender.  EXTREMITIES:  There is no clubbing, cyanosis, or edema.   ASSESSMENT:  [name] [name] appears to be stable from a cardiovascular  standpoint.  He still has concerning symptoms with exertional chest  pressure and exertional dyspnea.  He does not appear to have a crescendo  pattern to his symptoms.  He appears euvolemic on exam, although his  exam findings are limited secondary to his marked obesity.  I elected  not to change his medications today.  When he presents for his  catheterization later this week, I will plan on doing a right heart  catheterization in addition to the left heart catheterization so that we  can assess his hemodynamics and potential contribution of his cardiac  condition to his symptoms.   This was reviewed in detail with [name] [name] today, who understands.  I,  again, asked him to fill his clonidine so that we can try to get his  blood pressure under better control.     [name] Fells. [name], MD  Electronically Signed    MDC/MedQ  DD: 10/30/2006  DT: 10/30/2006  Job #: [id number]   cc:   [name], M.D.

## 2010-10-24 NOTE — Letter (Signed)
Oct 23, 2006    Turkey R. Rankins, M.D.  [address] [place], [place] [address]   RE:  [name], [name]  MRN:  [id number]  /  DOB:  [birth year]   Dear Dr. Barbaraann Barthel,   It was my pleasure to see [name] [name] at the [facility]  office as an outpatient on Oct 23, 2006.  As you know, [name] [name] is a 55-  year-old African/American man with the chief complaint of chest  pressure.  [name] [name] has a history of coronary artery disease and has had  multiple drug-eluting stents placed in the past.  Over the last two  months he describes chest pressure with relatively minimal exertion.  He  [name] of symptoms with walking up one flight of stairs, or even  walking on flat ground.  He feels a pressure across his chest with  associated shortness of breath.  His pain resolves fairly rapidly after  cessation of activity.  He has had no resting symptoms.   [name] [name] of some lower extremity edema over the last few  months.  He has no other acute complaints.  He specifically denies  orthopnea, PND, lightheadedness, syncope, palpitations or leg  claudication.  He has presented in the past with an inferior wall  myocardial infarction and was treated with a drug-eluting stent in his  right coronary artery.  He presented again in 2007, with an acute  coronary syndrome and underwent another PCI procedure with a stent  placed to an obtuse marginal branch of the left circumflex.  He has felt  well in the past after his stenting procedures, and again his symptoms  have risen over the last two months.   CURRENT MEDICATIONS:  1. Avapro 300 mg daily.  2. Lipitor 80 mg daily.  3. Coreg 25 mg b.i.d.  4. Plavix 75 mg daily.  5. Norvasc 10 mg daily.  6. Isosorbide dinitrate 60 mg, two daily.  7. Benazepril 40 mg daily.  8. Prilosec 40 mg daily.  9. Potassium 20 mEq daily.  10.Protonix 40 mg daily.  11.Lasix 20 mg daily.   ALLERGIES:  No known drug allergies.   PAST MEDICAL  HISTORY:  1. Coronary artery disease as detailed above.  A PCI of the right      coronary artery and branch of the left circumflex have been      performed.  A left ventriculography has demonstrated inferior wall      hypokinesis with the left ventricular ejection fraction estimated      at 35%-40%.  2. Hypertensive heart disease.  3. Morbid obesity.  4. Hypercholesterolemia.  5. Ongoing tobacco abuse.  6. Type 2 diabetes.   SOCIAL HISTORY:  The patient lives alone locally in [place].  He  smokes one pack of cigarettes daily.  He does not drink alcohol or use  illicit drugs.  He does not exercise.  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   FAMILY HISTORY:  Mother is deceased from cancer.  Father is deceased  after being struck by a car.  He has a sister who has hypertension and  is otherwise healthy.   REVIEW OF SYSTEMS:  A complete 12-point review of systems was performed.  The pertinent positives include depression, history of urinary tract  infections and gastroesophageal reflux disease.  All other systems were  reviewed and are negative,  except as described above.   PHYSICAL EXAMINATION:  GENERAL:  The patient is alert and oriented.  He  is in no acute distress.  VITAL SIGNS:  Weight 362 pounds, blood pressure 170/110 in the left arm,  170/110 in the right arm, heart rate 72, blood pressure on my recheck  was unchanged, respirations 18.  HEENT:  Normal.  NECK:  Normal carotid upstrokes without bruits.  Jugular venous pressure  normal.  No thyromegaly or thyroid nodules.  LUNGS:  Clear to auscultation bilaterally.  HEART:  Apex is discrete and nondisplaced.  The heart is regular rate  and rhythm without murmurs or gallops.  ABDOMEN:  Soft, obese, nontender.  No organomegaly.  EXTREMITIES:  There is no clubbing or cyanosis.  There is trace  bilateral pretibial edema.  Peripheral pulses  are 2+ and equal  throughout.  SKIN:  Warm and dry without rash.  LYMPHATICS:  There is no adenopathy.  NEUROLOGIC:  Cranial nerves II-XII  are intact.  Strength is 5/5 and  equal in the arms and legs bilaterally.   Electrocardiogram shows a normal sinus rhythm with left ventricular  hypertrophy and ST-T wave changes consistent with this.  There is  biatrial enlargement present.   ASSESSMENT/PLAN:  1. [name] [name] is a 55 year old gentleman with typical exertional angina,      CCS class 3:  While he is not having rest symptoms, he certainly      has angina with fairly minimal activity.  I think it would be      prudent to perform a repeat cardiac catheterization, as I have a      high suspicion that he has a recurrent problem with obstructive      coronary artery disease.  I discussed in detail the risks and      indications for repeating a cardiac catheterization, and he      understands and is willing to proceed.  I have asked him to resume      aspirin and continue his Plavix.  He will be scheduled for a      cardiac catheterization in the main cardiac catheterization      laboratory at [facility].  2. Hypertension, poorly-controlled:  He is on multiple medications,      but his blood pressure is markedly elevated today.  I asked him to      increase his Lasix to 40 mg daily and also started him on clonidine      0.1 mg b.i.d., in addition to his other multiple medications.  I      suspect his hypertension is related to essential hypertension and      his morbid obesity, but will likely perform an abdominal aortogram      at the time of his catheterization, to evaluate for renal artery      stenosis, in the setting of his marked hypertension.  3. Type 2 diabetes, under treatment:  Apparently not requiring      medicines at this point.  I will leave to your discretion regarding      treatment of his diabetes.  FOLLOWUP:  For followup I will review things with Mr.  [name] at the time  of his catheterization and determine followup, depending upon those  results.   Dr. Barbaraann Barthel, thanks for the opportunity to see [name] [name].  I will be in  touch after his diagnostic catheterization.  Please feel free to call at  any time with questions regarding  his care.    Sincerely,      [name]. [name], MD    MDC/MedQ  DD: 10/23/2006  DT: 10/23/2006  Job #: [id number]

## 2010-10-24 NOTE — Assessment & Plan Note (Signed)
[facility]                            CARDIOLOGY OFFICE NOTE   [name] [name]                       MRN:          [id number]  DATE:09/08/2007                            DOB:          24-Jul-1955    [name] [name] returns for followup at the [facility] office on  September 08, 2007.  [name] [name] is a very nice 55 year old gentleman with  coronary artery disease, hypertension and obesity.  He was treated  approximately 1 year ago for increased angina and had underwent cutting  balloon angioplasty of in-stent restenosis in the right coronary artery.   [name] [name] continues to have stable angina at this point.  He describes  some exertional dyspnea and chest pressure with moderate level activity.  His symptoms resolved after 3-4 minutes.  He takes nitroglycerin on a  very rare basis.  He denies orthopnea, PND, edema, palpitations,  lightheadedness or syncope.  He has been inconsistently taking his  medications and has not been following a good diet.   CURRENT MEDICATIONS:  1. Avapro 300 mg daily.  2. Lipitor 80 mg daily.  3. Plavix 75 mg daily.  4. Norvasc 10 mg daily.  5. Benazepril 40 mg daily.  6. Potassium chloride 20 mEq daily.  7. Protonix 40 mg daily.  8. Clonidine 0.1 mg b.i.d.  9. Aspirin 81 mg daily.  10.Lasix 40 mg daily.  11.Coreg 25 mg b.i.d.  12.Isosorbide 120 mg daily.   ALLERGIES:  NKDA.   PHYSICAL EXAMINATION:  The patient is alert and oriented.  He is in no  distress.  Weight is 343 pounds.  That is 12 pounds lighter than his  last visit in September 2008, blood pressure is 150/90, heart rate 60,  respiratory rate 16.  HEENT:  Normal.  NECK:  Normal carotid upstrokes without bruits.  Jugular venous pressure  is normal.  LUNGS:  Clear bilaterally.  HEART:  Regular rate and rhythm.  There are no murmurs or gallops.  ABDOMEN:  Soft, obese, nontender, no bruits.  EXTREMITIES:  No clubbing, cyanosis or edema.  Peripheral  pulses 2+ and  equal throughout.  SKIN:  Warm and dry without rash.   EKG shows normal sinus rhythm with LVH and associated repolarization  abnormality.  This is unchanged from his prior tracing from February 12, 2007.   ASSESSMENT:  1. Coronary artery disease.  Stable CCS class II angina.  No changes      in medical therapy today.  Reinforced the need to take his      antianginal and antihypertensive medications regularly.  Encouraged      exercise.  2. Hypertension.  Blood pressure under suboptimal control.  Same      discussion as above. The patient needs to take his antihypertensive      medications.  3. Dyslipidemia.  He remains on high-dose Lipitor.  He is followed at      [facility] with regular lab work there.  Last lipid panel on file      here is from March 05, 2007 and showed an LDL of 78, with an  HDL 45, triglycerides 54, total cholesterol 134.  Continues current      medications.   For followup, I would like to see [name] [name] back in 6 months.  I had a  long discussion today regarding the need and importance of lifestyle  modification.  [name] [name] continues to smoke.  He is not taking his  medications regularly and he is not following a good died at this point.     [name]. [name], MD  Electronically Signed    MDC/MedQ  DD: 09/08/2007  DT: 09/08/2007  Job #: [id number]   cc:   Fanny Dance. Rankins, M.D.  [facility]

## 2010-10-24 NOTE — Assessment & Plan Note (Signed)
[facility]                            CARDIOLOGY OFFICE NOTE   [name] [name], [name] [name]                       MRN:          [id number]  DATE:11/21/2006                            DOB:          1955/11/10    [name] returns for followup at the [facility] office on  November 21, 2006.  He is a 55 year old gentleman who has coronary artery  disease and obesity.  He presented with typical symptoms of exertional  angina as well as marked exertional dyspnea.  I elected to perform a  right and left heart catheterization, which was done on May 23.  This  demonstrated mildly elevated right heart pressures with mild pulmonary  hypertension with a mean PA pressure of 28 mmHg.  His coronaries showed  mild-to-moderate disease in the LAD as well as mild disease in the left  circumflex.  He has had previous stenting of the right coronary artery  with high-grade end-stent restenosis.  I elected to treat him with  cutting balloon angioplasty of the end-stent restenosis, which he  tolerated well and had no complications.   At presentation today, he reports that he is feeling much better.  He is  having mild dyspnea with activity, but this has improved.  He has had no  chest pain or chest pressure.  His blood pressure has been under better  control with the addition of clonidine.  He has no other problems to  report.   CURRENT MEDICATIONS:  1. Avapro 300 mg daily.  2. Lipitor 80 mg daily.  3. Coreg 25 mg twice daily.  4. Plavix 75 mg daily.  5. Norvasc 10 mg daily.  6. Isosorbide dinitrate 120 mg daily.  7. Benazepril 40 mg daily.  8. Potassium 20 mEq daily.  9. Protonix 40 mg daily.  10.Lasix 40 mg daily.  11.Clonidine 0.1 mg twice daily.  12.Aspirin 81 mg daily.   ALLERGIES:  NKDA.   PHYSICAL EXAMINATION:  GENERAL:  Patient is alert and oriented.  He is  in no acute distress.  VITAL SIGNS:  His weight is 357 pounds.  Blood pressure is 126/80.  Heart rate is 68.  Respiratory rate 16.  HEENT:  Normal.  NECK:  Normal carotid upstrokes without bruits.  Jugular venous pressure  is normal.  LUNGS:  Clear to auscultation bilaterally.  HEART:  Regular rate and rhythm without murmurs or gallops.  ABDOMEN:  Soft, obese, nontender.  No organomegaly.  EXTREMITIES:  There is trace pretibial edema bilaterally.  Peripheral  pulses are 2+ and equal.   ASSESSMENT:  Mr. Cordial is currently stable from a cardiovascular  standpoint.  His cardiac problems are as follows:  1. Coronary artery disease, status post recent percutaneous      intervention with cutting balloon angioplasty to end-stent      restenosis in the right coronary artery.  He has had good      symptomatic improvement with his angina.  I recommend continuation      of his current medical therapy without changes.  I tried to      encourage him  to increase his exercise as well as discontinue      smoking, which I think will be the two major factors for his long-      term prognosis.  2. Hypertension:  Excellent control on multi-drug therapy.  No changes      in medications at this time.  3. Dyslipidemia:  He is on high-dose atorvastatin.  He reports that      his lipids are checked at Dr. Barbaraann Barthel' office.  His goal LDL      cholesterol should be less than 70.   For followup, I will plan on seeing Mr. Tetzlaff back in three months or  sooner if any new problems arise.     [name]. [name], MD  Electronically Signed    MDC/MedQ  DD: 11/21/2006  DT: 11/21/2006  Job #: [id number]   cc:   Fanny Dance. Rankins, M.D.

## 2010-10-24 NOTE — Assessment & Plan Note (Signed)
[facility]                            CARDIOLOGY OFFICE NOTE   [name] [name], [name] [name]                       MRN:          [id number]  DATE:02/12/2007                            DOB:          Oct 09, 1955    Erick Murin returns for followup at the [facility] Cardiology Office on  February 12, 2007.  Mr. Jungbluth is a very nice 55 year old gentleman with  coronary artery disease, hypertension, and obesity.  He underwent a  diagnostic heart catheterization in May 2008 that demonstrated mild  pulmonary hypertension with in-stent restenosis in the right coronary  artery.  I treated him with cutting balloon angioplasty, which he  tolerated well.  He has had improvement in his angina since that time.   At present, he complains of stable exertional dyspnea with moderate  level of activity.  He has had some sharp fleeting pains in the upper  chest and neck that only last for a few seconds.  He has not had any  chest pressure or typical symptoms of angina.  Mr. Goldston continues to  smoke cigarettes.  He is not physically active.  He attempted a walking  program, but was short of breath, and stopped trying to exercise.  He  has no orthopnea, PND, edema, or other complaints.   CURRENT MEDICATIONS:  Include:  1. Avapro 300 mg daily.  2. Lipitor 80 mg daily.  3. Coreg 25 mg twice daily.  4. Plavix 75 mg daily.  5. Norvasc 10 mg daily.  6. Isordil 60 mg 2 daily.  7. Benazepril 40 mg daily.  8. Potassium chloride 20 mEq daily.  9. Protonix 40 mg daily.  10.Furosemide 40 mg daily.  11.Clonidine 0.1 mg twice daily.  12.Aspirin 81 mg daily.   EXAMINATION:  Mr. Snider is alert and oriented.  He is in no acute  distress.  His weight is 355 pounds.  Blood pressure is 115/66.  Heart rate is 59.  Respiratory rate 16.  HEENT:  Normal.  NECK:  Normal carotid upstrokes without bruits.  Jugular venous pressure  is normal.  LUNGS:  Clear to auscultation bilaterally.  HEART:   Regular rate and rhythm without murmurs or gallops.  ABDOMEN:  Soft, obese, nontender, and no organomegaly.  EXTREMITIES:  There is trace bilateral pretibial edema.  Peripheral  pulses are 2+ and equal throughout.  EKG:  Shows normal sinus rhythm with left ventricular hypertrophy and  associated repolarization abnormality.   ASSESSMENT:  1. Coronary artery disease, status post cutting balloon angioplasty to      in-stent restenosis this spring.  Mr. Vaeth has no angina at      present.  Continue current medical therapy with dual-antiplatelet      therapy on aspirin and Plavix, as well as aggressive secondary risk      reduction as detailed below.  2. Dyslipidemia.  Patient is on atorvastatin 80 mg daily.  His lab      work has been followed at [facility].  We have written him a      prescription and asked him to have lipids and LFTs  drawn, and faxed      to our office.  3. Hypertension.  He has excellent control on multi-drug therapy.  He      has had much better control since the addition of clonidine to his      medical regimen.  Continue current therapy without changes.  4. Lifestyle modification.  I again addressed the importance in detail      of tobacco cessation and an exercise program with Mr. Fouse.   For followup, I would like to see Mr. Petkus back in 6 months, or sooner  if any new problems arise.     [name]. [name], MD  Electronically Signed    MDC/MedQ  DD: 02/12/2007  DT: 02/12/2007  Job #: [id number]   cc:   Fanny Dance. Rankins, M.D.

## 2010-10-24 NOTE — Discharge Summary (Signed)
NAMESAVON, BORDONARO NO.:  [id number]   MEDICAL RECORD NO.:  [id number]          PATIENT TYPE:  OIB   LOCATION:  6525                         FACILITY:  [facility]   PHYSICIAN:  Learta Codding, MD,FACC DATE OF BIRTH:  [birth year]   DATE OF ADMISSION:  11/01/2006  DATE OF DISCHARGE:  11/02/2006                         DISCHARGE SUMMARY - REFERRING   BRIEF HISTORY:  The patient is a 55 year old African American male who  was referred by Dr. Barbaraann Barthel to Dr. [name] in the office on Oct 23, 2006.  He was complaining of 73-month history of chest pressure with minimal  exertion associated with shortness of breath resolved quickly after  rest.  Denied resting symptoms.   His history is notable for known coronary artery disease with multiple  drug-eluting stents in the past, hypertensive heart disease, morbid  obesity, hyperlipidemia, continued tobacco use, type 2 diabetes.   LABORATORY DATA:  Admission H&H was 14.4 and 44.0, normal indices,  platelets 194, WBCs 6.4.  Prior to discharge this had remained  unchanged.  PTT 29, PT 12.8.  Admission sodium 142, potassium 3.7, BUN  14, creatinine 1.20;  this also remained unchanged prior to discharge.  Post procedure CK total was 412, MB 3.2 and troponin 0.07.  Last chest x-  ray on March 9 showed chronic bibasilar atelectasis versus scarring.  No  new findings.  EKG showed sinus bradycardia, normal axis, early R-wave,  LVH with slight QRS widening.   HOSPITAL COURSE:  The patient was brought in for cardiac  catheterization.  This was performed on Nov 01, 2006 by Dr. [name].  He  had a 40%-50% proximal LAD patent OM1 stent, distal 50% circumflex, the  RCA stent had a 80% distal restenosis.  On review Dr. [name] performed  cutting balloon to the restenosis RCA stent, opening this to 0%, normal  LV function.  Post sheath removal and bedrest the patient was ambulating  without difficulty.  Catheterization site was intact.  Dr. Andee Lineman  after  review felt that the patient could be discharged home and continued on  his home medications.  Dr. Andee Lineman recommended weight loss, tobacco  cessation, also felt that he should have an outpatient evaluation for  obstructive sleep apnea.  He felt that his continued shortness of breath  was related to his obesity, deconditioning, hypertension and probable  obstructive sleep apnea.   DISCHARGE DIAGNOSES:  Angina pectoris with restenosis of the RCA stent,  status post cutting balloon, hyperglycemia with a history of diabetes,  hypertension, probable obstructive sleep apnea.   HISTORY:  As noted below.   PROCEDURES PERFORMED:  Cardiac catheterization with cutting balloon to  in-stent RCA restenosis on Nov 01, 2006.   DISPOSITION:  The patient is discharged home.  He is given permission to  return to work on Nov 05, 2006.  He is advised to maintain the low-  sodium heart-healthy ADA diet.  Wound care and activities are per  supplemental sheet.  He was asked to call our office to arrange a follow-  up appointment with Dr. [name] for 2 weeks.  He was also asked to  call  [facility] to continue to follow up with Dr. Barbaraann Barthel and possibly  arrange an outpatient obstructive sleep apnea enrollment and a weight  loss program and blood pressure management.   His medications include aspirin 325 mg daily, Avapro 300 mg daily,  Lipitor 80 mg q.h.s., Coreg 25 mg b.i.d., Plavix 75 mg daily, Norvasc 10  mg daily, Imdur 60 mg 2 tablets daily, benazepril 40 mg daily, Prilosec  40 mg daily,  potassium 20 mEq daily, Protonix 40 mg daily, Lasix 20 mg daily and  nitroglycerin 0.4 as needed.  He was advised no smoking or tobacco  products and to bring all medications to all follow-up appointments.   DISCHARGE TIME:  Thirty five minutes.      Joellyn Rued, PA-C      Learta Codding, MD,FACC  Electronically Signed    EW/MEDQ  D:  11/02/2006  T:  11/02/2006  Job:  [id number]   cc:   Learta Codding,  MD,FACC  Fanny Dance. Rankins, M.D.  [name]. [name], MD

## 2010-10-24 NOTE — Procedures (Signed)
NAME:  [name] [name], [name] [name]              ACCOUNT NO.:  [id number]   MEDICAL RECORD NO.:  [id number]          PATIENT TYPE:  OUT   LOCATION:  SLEEP CENTER                 FACILITY:  [facility]   PHYSICIAN:  Clinton D. Maple Hudson, MD, FCCP, FACPDATE OF BIRTH:  12-24-56   DATE OF STUDY:  09/15/2008                            NOCTURNAL POLYSOMNOGRAM   REFERRING PHYSICIAN:  Turkey R. Rankins, M.D.   INDICATION FOR STUDY:  Hypersomnia with sleep apnea.  Epworth sleepiness  score 14/24, BMI 40.9, weight 310 pounds, height 73 inches, and neck 18  inches.  Home medications are charted and reviewed.   SLEEP ARCHITECTURE:  Split study protocol.  During the diagnostic phase  total sleep time 137 minutes with sleep efficiency 91.6%.  Stage I was  6.6%, stage II 89.8%, stage III absent, REM 3.6% of total sleep time.  Sleep latency 3.5 minutes, REM latency 107 minutes, awake after sleep  onset 3 minutes, arousal index 41.2.   BEDTIME MEDICATIONS:  Coreg and clonidine.   RESPIRATORY DATA:  Split study protocol.  Apnea/hypopnea index (AHI)  38.1 per hour.  A total of 87 events were scored including 25  obstructive apneas and 62 hypopneas.  Events were mainly associated with  supine sleep position.  REM AHI 72 per hour.  CPAP was then titrated to  17 CWP, AHI 0 per hour.  He wore a large ResMed Mirage Quattro full-face  mask with heated humidifier.   OXYGEN DATA:  Moderate snoring before CPAP with oxygen desaturation to a  nadir of 75%.  After CPAP control, mean oxygen saturation held 93.7% on  room air.   CARDIAC DATA:  Sinus rhythm with PACs and frequent multifocal PVCs.   MOVEMENT/PARASOMNIA:  No significant movement disturbance.  Bathroom x1.   IMPRESSION/RECOMMENDATIONS:  1. Moderately severe obstructive sleep apnea/hypopnea syndrome, AHI of      38.1 per hour with supine events, moderate snoring and oxygen      desaturation to a nadir of 75% on room air.  2. Successful continuous positive airway  pressure to 17 CWP, AHI 0 per      hour.  He wore a large ResMed Mirage      Quattro full-face mask with heated humidifier.  3. EKG showed premature atrial contractions and frequent multifocal      premature ventricular contractions.      Clinton D. Maple Hudson, MD, [facility], FACP  Diplomate, Biomedical engineer of Sleep Medicine  Electronically Signed     CDY/MEDQ  D:  09/18/2008 12:03:18  T:  09/19/2008 00:34:30  Job:  [id number]

## 2010-10-24 NOTE — Assessment & Plan Note (Signed)
[facility]                            CARDIOLOGY OFFICE NOTE   [name] [name], [name] [name]                       MRN:          [id number]  DATE:07/22/2008                            DOB:          1956/05/08    REASON FOR VISIT:  Followup CAD.   HISTORY OF PRESENT ILLNESS:  [name] [name] is a 55 year old gentleman with  coronary artery disease and prior percutaneous coronary intervention.  He has had prior stenting of the right coronary artery and underwent  cutting balloon angioplasty back in 2008 to treat in-stent restenosis.  He has been stable since that time.  He has complained of periodic chest  pains in the past.  However, over the last several months, he has had no  problems with chest pain or pressure.  He has stable exertional dyspnea  which is unchanged overtime.  He has not required any sublingual  nitroglycerin since his last visit.  He denies orthopnea, PND, edema,  lightheadedness, or syncope.   Medical noncompliance has been a longstanding issue.  He has been doing  little better with his medications, but still has some confusion about  what he should be taking.  He did not take his antihypertensive  medications prior to his office visit today.  He continues to smoke  cigarettes.   CURRENT MEDICATIONS:  1. Famotidine 40 mg daily.  2. Avapro 300 mg daily, is on his list, but he is not taking it.  3. Plavix 75 mg daily.  4. Norvasc 10 mg daily.  5. Benazepril 40 mg daily.  6. Potassium chloride 20 mEq daily.  7. Clonidine 0.1 mg twice daily.  8. Furosemide 40 mg daily.  9. Coreg 25 mg b.i.d.  10.Pravastatin 80 mg at bedtime.   ALLERGIES:  NKDA.   PHYSICAL EXAMINATION:  GENERAL:  The patient is alert and oriented.  He  is an obese male in no acute distress.  VITAL SIGNS:  Weight is 350 pounds, blood pressure 150/100, heart rate  60, respiratory rate 16.  HEENT:  Normal.  NECK:  Normal carotid upstrokes.  No bruits.  JVP normal.  LUNGS:   Clear bilaterally.  HEART:  Regular rate and rhythm.  No murmurs or gallops.  ABDOMEN:  Soft, nontender, obese.  No organomegaly.  No bruits.  EXTREMITIES:  No clubbing, cyanosis, or edema.  Peripheral pulses intact  and equal.   EKG shows normal sinus rhythm with sinus arrhythmia, LVH, and  nonspecific T-wave abnormality.   ASSESSMENT:  1. Coronary artery disease.  The patient is stable with no angina.      Continue current medical program.  Continue antiplatelet therapy      with aspirin and Plavix.  2. Dyslipidemia.  He is followed by Dr. Barbaraann Barthel at [facility].      Continue pravastatin.  3. Hypertension with suboptimal control.  I am not sure how his blood      pressures running because he missed his medicines this morning.  We      had a long discussion regarding the importance of medication      compliance.  He  should continue on his current medical program.  4. Ongoing tobacco abuse.  Tobacco cessation counseling was done.   For followup, I would like to see [name] [name] back in the office in 6  months.     [name]. [name], MD  Electronically Signed    MDC/MedQ  DD: 07/22/2008  DT: 07/23/2008  Job #: [id number]   cc:   Fanny Dance. Rankins, M.D.

## 2010-10-27 NOTE — Discharge Summary (Signed)
[name], [name]              ACCOUNT NO.:  [id number]   MEDICAL RECORD NO.:  [id number]          PATIENT TYPE:  INP   LOCATION:  4710                         FACILITY:  [facility]   PHYSICIAN:  Mohan N. Sharyn Lull, M.D. DATE OF BIRTH:  Aug 30, 1955   DATE OF ADMISSION:  12/17/2004  DATE OF DISCHARGE:  12/21/2004                                 DISCHARGE SUMMARY   ADMISSION DIAGNOSES:  1.  Acute inferior wall myocardial infarction.  2.  Hypertension.  3.  Elevated blood sugar. Rule out diabetes mellitus.  4.  Hypercholesterolemia.  5.  Tobacco abuse.  6.  Morbid obesity.  7.  Positive family history of coronary artery disease.   FINAL DIAGNOSES:  1.  Status post inferior wall myocardial infarction.  2.  Status post emergency percutaneous transluminal coronary angioplasty and      stenting to 100% occluded right coronary artery.  3.  Hypertension.  4.  Non-insulin-dependent diabetes mellitus, controlled by diet.  5.  Hypercholesterolemia.  6.  Tobacco abuse.  7.  Positive family history of coronary artery disease.  8.  Morbid obesity.   DISCHARGE HOME MEDICATIONS:  1.  Enteric coated aspirin 81 mg two tablets daily.  2.  Plavix 75 mg one tablet daily with food.  3.  Altace 10 mg one capsule twice daily.  4.  Coreg 25 mg one tablet q.12h.  5.  Imdur 120 mg one tablet daily in the morning.  6.  Avapro 150 mg one tablet daily.  7.  Lipitor 40 mg one tablet daily.  8.  Nitrostat 0.4 mg sublingual use as directed.   ACTIVITY:  Avoid heavy lifting, pushing or pulling for 48 hours.   DIET:  Low salt, low cholesterol diet.  Patient has been advised to avoid  sweets and reduce weight.   Post PTCA and stent instructions have been given. Follow up with me in one  week.  Patient will be scheduled for phase II cardiac rehab as an  outpatient.   CONDITION ON DISCHARGE:  Stable.   BRIEF HISTORY AND HOSPITAL COURSE:  [name] is a 55 year old black male  with past medical history  significant for coronary artery disease, history  of inferior wall MI, status post PTCA and stenting to mid RCA in June of  2002, hypertension, hypercholesterolemia, morbid obesity, tobacco abuse,  positive family history of coronary artery disease.  He came to the ER  complaining of retrosternal chest pressure grade 8/10 associated with  diaphoresis and shortness of breath.  Patient received IV nitrates, heparin  and aspirin with relief of chest pain.  EKG done in the ER showed normal  sinus rhythm, LVH with strain pattern with anterolateral wall ischemia which  were new as compared to prior EKG and was noted to have mildly elevated CPK-  MB and troponin I.  Patient again started having chest pain, grade 3/10  which resolved with increasing nitroglycerin.  Patient states he has been  having recurrent chest pain off and on for the last one month but did not  seek any medical attention and he ran out of all his  medications.   PAST MEDICAL HISTORY:  As above.   PAST SURGICAL HISTORY:  None.   ALLERGIES:  None.   SOCIAL HISTORY:  Single.  Presently unemployed.  Worked for medical supply  company in the past.  Smokes one pack per day for 20+ years.  No history of  alcohol abuse or drug abuse.   FAMILY HISTORY:  Positive for coronary artery disease.   PHYSICAL EXAMINATION:  GENERAL APPEARANCE:  He was awake, alert and oriented  x3 in no acute distress.  VITAL SIGNS:  Blood pressure 145/71, pulse 65.  HEENT:  Conjunctivae pink.  NECK:  No JVD, no bruit.  LUNGS:  He has bibasilar rales and decreased breath sounds.  CARDIOVASCULAR:  S1 and S2 was normal.  There was soft S3 and S4 gallop.  ABDOMEN:  Soft. Bowel sounds were present, nontender, obese.  EXTREMITIES:  No clubbing, cyanosis, or edema.   LABORATORY DATA:  EKG showed normal sinus rhythm with LVH with strain  pattern versus anterolateral and inferior wall ischemia.   Cholesterol was 163, LDL 118, HDL 35.  His first set of  cardiac enzymes was  slightly elevated.  CK was 459, MB 7.4, troponin 0.76.  Second set CK 33, MB  79, relative index 9.5.  Third set CK 811, MB 40.6, relative index 5.0.  Fourth set CK 616, MB 20.4, relative index 3.3.  Next set on December 19, 2004,  CK 300, MB 4.7, relative index 1.6.  Troponin I were 0.76, 15.10, 16.77,  10.02, 5.75 and 4.03.  Hemoglobin was 14.6, hematocrit 44, repeat hemoglobin  post procedure was 12.1, hematocrit 36.3.  On December 19, 2004, hemoglobin  11.6, hematocrit 35.  Repeat hemoglobin on December 20, 2004, was 11.8 and  hematocrit 36.  Sodium 139, potassium 3.0 which was replaced.  On December 20, 2004, potassium 3.6.  His glucose was 169.  Repeat fasting glucose was 98 on  December 20, 2004.  BUN 10, creatinine 1.4.  Hemoglobin A1c was 6.3.   BRIEF HOSPITAL COURSE:  Patient underwent emergency left cath and PTCA and  stenting to 100% occluded RCA.  As per procedure report, patient tolerated  procedure well.  There were no complications.  Post procedure, patient did  not have any chest pain during the hospital stay.  Phase I cardiac rehab was  called and also consultation was obtained for tobacco cessation.  Patient  states he will stop smoking completely.  Patient has been ambulating in the  hallway without any problems.  He does have very small hematoma with no  evidence of bruit.  Patient has been  ambulating in hallway without any problems. His hemoglobin has been stable.  His blood pressure is fairly well controlled on home medications.  Patient  will be discharged home on above medications and will be followed up in my  office in one week.       MNH/MEDQ  D:  12/21/2004  T:  12/21/2004  Job:  [id number]

## 2010-10-27 NOTE — Discharge Summary (Signed)
[place]  Patient:    [name] [name], [name] [name]                       MRN: [id number] Adm. Date:  [id number] Disc. Date: [id number] Attending:  Robynn Pane                           Discharge Summary  No dictation. DD:  11/22/00 TD:  11/23/00 Job: [id number]

## 2010-10-27 NOTE — Cardiovascular Report (Signed)
[place]  Patient:    [name] [name], [name] [name] Visit Number: [id number] MRN: [id number]          Service Type: CAT Location: [facility] 2855 [id number] Attending Physician:  Robynn Pane Dictated by:   Eduardo Osier Sharyn Lull, M.D. Proc. Date: 12/04/01 Admit Date:  12/04/2001 Discharge Date: 12/04/2001   CC:         Cardiac Catheterization Laboratory   Cardiac Catheterization  PROCEDURE: Left cardiac catheterization with selective left and right coronary angiography, left ventriculography via right groin using Judkins technique.  INDICATIONS FOR PROCEDURE: The patient is a 55 year old black male with a past medical history significant for coronary artery disease, status post inferior wall MI, status post PTCA and stenting to RCA in June of 2002, hypertension, hypercholesterolemia, morbid obesity, tobacco abuse, complains of recurrent retrosternal chest pain off and on associated with exertion without any associated symptoms. States pain relived with rest and sublingual nitroglycerin. Denies any nausea, vomiting, diaphoresis. Denies shortness of breath but complains of occasional palpitations. No lightheadedness or syncope. The patient had ECG done in my office which showed normal sinus rhythm with LVH with early equalization changes with T wave inversion in the lateral leads which were new as compared to prior ECG. Due to typical anginal chest pain, ECG changes and multiple risk factors, the patient was advised for left catheterization, possible PTCA.  PAST MEDICAL HISTORY: As above.  PAST SURGICAL HISTORY: None.  ALLERGIES: He has no known drug allergies.  HOME MEDICATIONS: 1. He is on Altace 10 mg p.o. b.i.d. 2. Norvasc 5 mg p.o. q.d. 3. Toprol-XL 200 mg p.o. q.d. 4. Hyzaar 100/25 p.o. q.d. 5. Enteric-coated aspirin 325 mg p.o. q.d. 6. Zoloft 50 mg p.o. q.d.  SOCIAL HISTORY: He is single, smokes less than one pack-per-day for 20+ years. No history of  alcohol abuse. Works for [employer].  FAMILY HISTORY: Positive for coronary artery disease on fathers side.  PHYSICAL EXAMINATION:  GENERAL: On examination, he is alert, awake, oriented x3 in no acute distress.   VITAL SIGNS: Blood pressure is 160/100, pulse 66, regular.  HEENT: Conjunctiva pink.  NECK: Supple, no JVD, no bruits.  LUNGS: Lungs are clear to auscultation without rhonchi or rales.  CARDIOVASCULAR: S1 and S2 was normal. There was normal S3 gallop. There was a soft S4 at the apex.  ABDOMEN: Soft, bowel sounds are present, nontender.  EXTREMITIES: There is no clubbing, cyanosis, or edema.  ECG showed normal sinus rhythm as stated above. LVH with strain pattern versus lateral ischemia and old inferior wall MI.  IMPRESSION: Accelerated angina, coronary artery disease, history of inferior wall myocardial infarction, uncontrolled hypertension, hypercholesterolemia, morbid obesity, tobacco abuse, positive family history of coronary artery disease.  Discussed with patient regarding left catheterization, possible PTCA, stenting, its risks, i.e., death, MI, stroke, need for emergency CABG, risk of restenosis, local vascular complications, etc., and consented for  PCI.  DESCRIPTION OF PROCEDURE: After obtaining the informed consent, the patient was brought to the catheterization lab and was placed on the fluoroscopy table.  The right groin was prepped and draped in the usual fashion. Xylocaine 2% was used for local anesthesia in the right groin. With the help of a thin-walled needle, a 6 French arterial sheath was placed. The sheath was aspirated and flushed. Next, a 6 French left Judkins catheter was advanced over the wire under fluoroscopic guidance up to the ascending aorta. The wire was pulled out, the catheter was aspirated and connected to the  manifold. The catheter was further advanced and engaged into left coronary ostium. Multiple views of the left  system were taken. Next, the catheter was disengaged and was pulled out over the wire and was replaced with 6 French right Judkins catheter, which was advanced over the wire under fluoroscopic guidance up to the ascending aorta. The wire was pulled out, the catheter was aspirated and connected to the manifold. The catheter was further advanced and engaged into the right coronary ostium. Multiple views of the right system were taken. Next, the catheter was disengaged and was pulled out over the wire and was replaced with a 6 French pigtail catheter, which was advanced over the wire under fluoroscopic guidance up to the ascending aorta. The wire was pulled out, the catheter was aspirated and connected to the manifold. The catheter was further advanced across the aortic valve into the LV. LV pressures were recorded. Next, left ventriculography was done in 30-degree RAO RAO position. Post angiographic pressures were recorded from LV and then pullback pressures were recorded from the aorta. There was no gradient across the aortic valve. Next, the pigtail catheter was pulled out over the wire, the sheaths were aspirated and flushed.  FINDINGS: LV showed inferior wall severe hypokinesia, EF of 45-50%.  Left main was patent.  LAD has 40% proximal stenosis. Diagonal #1 is very small. Diagonal #2 has 30-40% proximal stenosis, which is also a small vessel. Diagonal #3 is patent.  Left circumflex has 15-20% proximal stenosis. OM-1 and OM-2 are very small. OM-3 and OM-4 are patent, which are medium sized.  RCA is proximally tortuous and has 20-30% stenosis and 20% mid in-stent re-stenosis. Arteriotomy was closed with Perclose without any complications. The patient tolerated the procedure well and was transferred to the recovery room in stable condition. Dictated by:   Eduardo Osier Sharyn Lull, M.D. Attending Physician:  Robynn Pane DD:  12/04/01 TD:  12/05/01 Job: [id number]

## 2010-10-27 NOTE — Cardiovascular Report (Signed)
[place]  Patient:    [name] [name], [name] [name]                       MRN: [id number] Proc. Date: 11/18/00 Adm. Date:  [id number] Disc. Date: [id number] Attending:  Robynn Pane CC:         Cardiac Catheterization Laboratory   Cardiac Catheterization  PROCEDURES: 1. Left cardiac catheterization with selective right and left coronary    angiography, left ventriculography via the right groin using Judkins    technique. 2. Successful percutaneous transluminal coronary angioplasty to proximal    right coronary artery using a 3.0 x 15 mm, long, CrossSail balloon. 3. Successful deployment of a 3.0 x 18 mm, long Bx Velocity stent in    proximal right coronary artery.  INDICATION FOR PROCEDURE:  The patient is a 55 year old, black male, with a past medical history significant for mild coronary artery disease, hypertension, tobacco abuse, who came to the ER complaining of retrosternal chest pressure, grade 10/10, associated with diaphoresis, which started around 8 p.m. after eating a large meal.  He called EMS and was brought to the ER. ECG done in the ER showed normal sinus rhythm with ST elevation in lead II, III, aVF and ______ ST depression in I, aVL, V5, V6.  The patient received IV heparin, nitrates, beta blockers, 6 mg of morphine, aspirin, and 300 mg of Plavix in the ER with partial relief of chest pain.  The patient denies any nausea, vomiting, or diaphoresis.  Denies palpitations, lightheadedness, or syncope.  Denies that sort of chest pain in the past.  Discussed with patient ECG finding and various options of treatment, i.e., thrombolytic therapy versus emergency primary PTCA and stenting, its risks and benefits, i.e. death, MI, stroke, need for emergency CABG, risk of re-stenosis, local local vascular complications, etc., and consented for the procedure.  DESCRIPTION OF PROCEDURE:  After obtaining the informed consent, the patient was brought to  the catheterization lab and was placed on the fluoroscopy table.  The right groin was prepped and draped in the usual fashion. Xylocaine 2% was used for local anesthesia in the right groin.  With the help of a thin-walled needle, a 7 French arterial sheath was placed.  The sheath was aspirated and flushed.  Next, a 6 French left Judkins catheter was advanced over the wire under fluoroscopic guidance up to the ascending aorta. The wire was pulled out, the catheter was aspirated and connected to the manifold.  The catheter was further advanced and engaged into the left coronary ostium.  Multiple views of the left system were taken.  Next, the catheter was disengaged and was pulled out over the wire and was replaced with a 6 French right Judkins catheter, which was advanced over the wire under fluoroscopic guidance up to the ascending aorta.  The wire was pulled out, the catheter was aspirated and connected to the manifold.  The catheter was further advanced and engaged into the right coronary ostium.  Multiple views of the right system were taken.  Next, the catheter was disengaged and was pulled out over the wire and was replaced with a 6 French pigtail catheter at the end of the procedure, which was advanced over the wire under fluoroscopic guidance up to the ascending aorta.  The wire was pulled out.  The catheter was aspirated and connected to the manifold.  The catheter was further advanced across the aortic valve into the LV.  LV pressures  were recorded. Left ventriculography was done in the 30 degree RAO position.  Post angiographic pressures were recorded from LV and then pullback pressures were recorded from aorta.  There was no gradient across the aortic valve.  Next, the pigtail catheter was pulled out, the sheaths were aspirated and flushed.  FINDINGS:  LV showed inferobasal wall hypokinesia, EF of 45-50%.  Left main was patent.  LAD has 30-40% proximal stenosis.  Diagonal #1  has 20-30% proximal stenosis. Diagonal #2 was patent.  Left circumflex has 10-15% proximal stenosis.  OM-1 was small which was patent.  OM-2 was large which was patent.  OM-3 medium sized which was patent. RCA was occluded 100% proximally.  INTERVENTIONAL PROCEDURE:  Successful percutaneous transluminal coronary angioplasty of the proximal RCA was done using a 3.0 x 15 mm, long CrossSail balloon for pre-dilatation and then 3.0 x 18 mm, long Bx Velocity.  The stent was deployed at 10 atmospheres which was fully expanded going up to 15 atmospheres of pressure.  The lesion was dilated from 100% to 0% ostial with ______ TIMI grade 3 distal flow without evidence of dissection nor distal embolization.  The patient received weight-based heparin, ReoPro, Plavix, 300 mg aspirin during and after the procedure.  A temporary pacer was inserted via the right femoral vein.  Prior to the procedure, the patient had a brief episode of hypertension which responded to IV normal saline and dopamine. Dopamine was discontinued at the end of the procedure.  The patient tolerated the procedure well.  There were no complications.  The patient was transferred to recovery room in stable condition. DD:  11/22/00 TD:  11/23/00 Job: [id number]

## 2010-10-27 NOTE — Cardiovascular Report (Signed)
[name], [name]              ACCOUNT NO.:  [id number]   MEDICAL RECORD NO.:  [id number]          PATIENT TYPE:  INP   LOCATION:  2034                         FACILITY:  [facility]   PHYSICIAN:  Eduardo Osier. Sharyn Lull, M.D. DATE OF BIRTH:  29-Jan-1956   DATE OF PROCEDURE:  03/01/2006  DATE OF DISCHARGE:                              CARDIAC CATHETERIZATION   PROCEDURE:  1. Left cardiac catheterization with selective left and right coronary      angiography, left ventriculography via right groin using Judkins      technique.  2. Successful PTCA to mid OM-4 using 2.5 x 8-mm long Voyager balloon.  3. Successful deployment of 2.75 x 13-mm long Cypher drug-eluting stent in      mid OM-4.   INDICATION FOR THE PROCEDURE:  [name] is a 55 year old black male with  past medical history significant for coronary artery disease; history of  inferior wall MI in the past; status post PCI to RCA in June 2002 and then  subsequently had PCI to RCA in July 2006; hypertension; history of  congestive heart failure; hypercholesteremia; morbid obesity; noncompliant  to medication and followup.  He came to the ER complaining of recurrent  retrosternal chest pain off and on for last 1 week described as dull aching,  localized, associated with nausea and mild shortness of breath.  The patient  was noted to have BP of 266 systolic palpable, received three sublingual  nitroglycerin and baby aspirin, and was started on IV heparin and IV  nitrates with relief of chest pain.  The patient was noted to have mildly  elevated CPK-MB and troponin I.  EKG showed normal sinus rhythm with LVH  with strain pattern versus inferolateral wall ischemia.  Due to typical  anginal chest pain and elevated cardiac enzymes suggestive of small non-Q-  wave MI I discussed with the patient regarding left catheterization,  possible PTCA and stenting, its risks and benefits i.e. death, MI, stroke,  need for emergency CABG, risk of  restenosis, local vascular complications;  accepted and consented for the procedure.   PROCEDURE:  After obtaining the informed consent the patient was brought to  the catheterization laboratory and was placed on fluoroscopy table.  Right  groin was prepped and draped in usual fashion.  Xylocaine 2% was used for  local anesthesia in the right groin.  With the help of a thin-wall needle, 6-  French arterial and venous sheaths were placed.  Both the sheaths aspirated  and flushed.  Next, 6-French left Judkins catheter was advanced over the  wire under fluoroscopic guidance up to the ascending aorta.  The wire was  pulled out, the catheter was aspirated and connected to the manifold.  Catheter was further advanced and engaged into left coronary ostium.  Multiple views of the left system were taken.  Next, the catheter was  disengaged and was pulled out over the wire and was replaced with 6-French  right Judkins catheter which was advanced over the wire under fluoroscopic  guidance up to the ascending aorta.  Wire was pulled out, the catheter was  aspirated and  connected to the manifold.  Catheter was further advanced  across the aortic valve into the LV.  LV pressures were recorded.  Next,  left ventriculograph was done in 30-degree RAO position.  Post angiographic  pressures were recorded from LV and then pullback pressures were recorded  from aorta.  There was no gradient across the aortic valve.  Next, the  pigtail catheter was pulled out over the wire.  Sheaths were aspirated and  flushed.   FINDINGS:  LV showed inferior wall hypokinesia, moderate LVH, EF of 35-40%.  Left main was patent.  LAD has 30-40% proximal stenosis and then 20-30%  diffuse stenosis at the apex.  LAD is very small and diffusely diseased.  Diagonal one is small which has 10-15% stenosis.  Left circumflex has 20-30%  stenosis in mid portion and 30-40% distal stenosis.  OM-1 and OM-2 were  very, very small which were  less than 1 mm.  OM-3 had 80-85% ostial  stenosis.  The vessel is very small, supplying a very small area of  myocardium, which is approximately 1.25-mm vessel.  OM-4 had 95% mid  stenosis with haziness which was felt to be the culprit lesion for his small  non-Q-wave MI.  RCA has 30-40% diffuse in-stent stenosis in proximal portion  and 50-60% mid in-stent restenosis.   INTERVENTIONAL PROCEDURE:  Successful PTCA to mid OM-4 was done using 2.5 x  8-mm long Voyager balloon for predilatation and then 2.75 x 13-mm long  Cypher drug-eluting stent was deployed at 11 atmospheric pressure.  Stent  was fully expanded going up to 18 atmospheric pressure.  Lesion dilated from  99% to 0% residual with excellent TIMI grade 3 distal flow without evidence  of dissection or distal embolization.  The patient received weight-based  heparin, Integrilin, and 300 mg of Plavix during the procedure.  The patient  did receive 300 mg of Plavix prior to the procedure.  The patient was  transferred to recovery room in stable condition.  The patient tolerated the  procedure well and no complications.           ______________________________  Eduardo Osier Sharyn Lull, M.D.     MNH/MEDQ  D:  03/01/2006  T:  03/04/2006  Job:  [id number]   cc:   Cath Lab

## 2010-10-27 NOTE — Cardiovascular Report (Signed)
NAMEJERMAYNE, [name]              ACCOUNT NO.:  [id number]   MEDICAL RECORD NO.:  [id number]          PATIENT TYPE:  INP   LOCATION:  4710                         FACILITY:  [facility]   PHYSICIAN:  Mohan N. Sharyn Lull, M.D. DATE OF BIRTH:  1955/11/29   DATE OF PROCEDURE:  12/17/2004  DATE OF DISCHARGE:                              CARDIAC CATHETERIZATION   PROCEDURES:  1.  Left cardiac catheterization with selective left and right coronary      angiography.  2.  Left ventriculogram via right groin using Judkins technique.  3.  Successful percutaneous transluminal coronary angiography to 100%      occluded proximal right coronary artery using 3.0 x 12 mm long Maverick      balloon.  4.  Successful deployment of 3.5 x 33 mm long Cypher drug-eluting stent in      proximal and mid right coronary artery.  5.  Successful insertion of temporary transvenous pacemaker via right      femoral venous approach.   INDICATIONS FOR THE PROCEDURE:  Mr. Demetro is a 55 year old black male with  past medical history significant for coronary artery disease, history of  inferior wall MI in the past, status post PCI to RCA in June of 2002,  hypertension, hypercholesteremia, morbid obesity, tobacco abuse and positive  family history of coronary artery disease. He came to the ER complaining of  retrosternal chest pressure graded at 8/10 associated with diaphoresis and  shortness of breath. The patient received IV nitroglycerin, heparin and  aspirin with relief of chest pain. EKG done in the ER showed normal sinus  rhythm with LVH with strain pattern versus anterolateral and inferior wall  ischemia which were new as compared to prior EKG and was noted to have  mildly elevated CPK-MB and troponin I. The patient again started chest pain  grade 3/10 and resolved with increasing nitroglycerin. The patient states he  has been having recurrent chest pain off and on for the last one month, but  did not seek any medical  attention and ran out of all his medications. Due  to new EKG changes and mildly positive troponin I, discussed with the  patient regarding emergency left catheterization and possible PTCA stenting  and its risks, i.e. death, stroke, need for emergency CABG, risk of  restenosis, local vascular complications, etc., and consented for PCI.   DESCRIPTION OF PROCEDURE:  After obtaining informed consent, the patient was  brought to the catheterization lab and was placed on the fluoroscopy table.  The right groin was prepped and draped in usual fashion. Two percent  Xylocaine was used for local anesthesia in the right groin. With the help of  a thin-wall needle, a 6 French arterial sheath was placed. The sheath was  aspirated and flushed. A 7 French arterial sheath was placed and 6 French  venous sheaths were placed. Both of the sheaths aspirated and flushed. Next,  a 6 French left Judkins catheter was advanced over the wire under  fluoroscopic guidance up to the ascending aorta. The wire was pulled out.  The catheter was aspirated and connected to the  manifold. The catheter was  further advanced and engaged into the left coronary ostium. Multiple views  of the left system were taken. Next, the catheter was disengaged and was  pulled out over the wire was replaced with a 6 French right Judkins catheter  which was advanced over the wire under fluoroscopic guidance up to the  ascending aorta. The wire was pulled out. The catheter was aspirated and  connected to the manifold. The catheter was further advanced and engaged  into right coronary ostium. Multiple views of the right system were taken.  Next, the catheter was disengaged and was pulled out over the wire and was  replaced with a 6 French pigtail catheter which was advanced over the wire  under fluoroscopic guidance up to the ascending aorta. The catheter was  further advanced across the aortic valve into the LV. LV pressures were  recorded.  Next, left ventriculogram was done in the 30 degree RAO position.  Post angiographic pressures were recorded from LV and then pullback  pressures were recorded from the aorta. There was no gradient across the  aortic valve. Next, the pigtail catheter was pulled out over the wire.  Sheaths aspirated and flushed.   FINDINGS:  LV showed inferobasal wall severe hypokinesia, moderate to severe  LVH and EF of 45% approximately. The left main was short, which was patent.  The LAD had 40-50% proximal stenosis and 20-30% mid stenosis. The vessel  appears to be diffusely diseased. Diagonal 1 and 2 were very, very small.  The left circumflex had 20-30% proximal stenosis. OM1 was less than 0.5 mm.  OM2 was very, very small. OM3 had 20-30% stenosis. OM4 was small, which was  patent. The RCA was 100% occluded proximally.   INTERVENTIONAL PROCEDURES:  Successful PTCA to proximal RCA was done using  3.0 x 12 mm long Maverick balloon for predilatation and then a 3.5 x 33 mm  long Cypher drug-eluting stent was deployed in the proximal and mid RCA  going up to 13 atmospheric pressure. The stent was post dilated using a 3.5  x 13 mm long PowerSail balloon going up to 18 atmospheric pressure in the  mid and distal end of the stent. Then a 4.0 x 8 mm long PowerSail balloon  was used to fully expand the proximal end of the stent going up to 15  atmospheric pressure. The lesion was dilated from 100% to 0% residual with  excellent TIMI grade 3 distal flow without evidence of dissection or distal  embolization. The patient received weight-based heparin, Integrilin and 600  mg of Plavix during the procedure. A temporary transvenous pacer was  inserted prior to PCI via the right femoral venous approach without  complications, which was discontinued at the end of the procedure. The  patient tolerated the procedure well. There are no complications. The patient was transferred to the recovery room in stable  condition.       MNH/MEDQ  D:  12/21/2004  T:  12/21/2004  Job:  [id number]   cc:   Catheterization Laboratory

## 2010-10-27 NOTE — Discharge Summary (Signed)
[name] [name], [name] [name]              ACCOUNT NO.:  [id number]   MEDICAL RECORD NO.:  [id number]          PATIENT TYPE:  INP   LOCATION:  2034                         FACILITY:  [facility]   PHYSICIAN:  Eduardo Osier. Sharyn Lull, M.D. DATE OF BIRTH:  [birth year]   DATE OF ADMISSION:  03/05/2006  DATE OF DISCHARGE:                                 DISCHARGE SUMMARY   DATE OF ADMISSION:  March 01, 2006.   DATE OF DISCHARGE:  March 05, 2006.   ADMITTING DIAGNOSES:  1. Small non-Q-wave myocardial infarction.  2. Decompensated congestive heart failure.  3. Hypertensive urgency secondary to noncompliance to medication.  4. Elevated blood sugar due to diabetes mellitus.  5. Mild renal insufficiency.  6. Morbid obesity.  7. Hypercholesterolemia.  8. Positive family history of coronary artery disease.  9. Tobacco abuse.   FINAL DIAGNOSES:  1. Status post small non-Q-wave myocardial infarction status post      percutaneous coronary intervention to OM4.  2. Compensated congestive heart failure status post hypertensive urgency.  3. Non-insulin dependent diabetes mellitus controlled by diet.  4. Morbid obesity.  5. Mild renal insufficiency, stable.  6. Hypercholesterolemia.  7. Tobacco abuse.  8. Positive family history of coronary artery disease.   DISCHARGE HOME MEDICATIONS:  1. Enteric-coated aspirin 325 mg 1 tablet daily.  2. Plavix 75 mg 1 tablet daily with food.  3. Coreg 12.5 mg 1 tablet every 12 hours.  4. Avapro 300 mg 1 tablet daily.  5. Imdur 60 mg 1 tablet daily.  6. Lipitor 80 mg 1 tablet daily.  7. Lotrel 5/40 one capsule daily.  8. Nitrostat 0.4 mg sublingual use as directed.   INSTRUCTIONS:  1. Patient has been advised to reduce weight, low-salt, low-cholesterol,      1800-calorie ADA diet.  2. Patient has been advised to watch sweets.  3. Post PTCA stent instructions have been given.  4. Follow up with me in 1 week.  Patient has been scheduled for phase 2      cardiac  rehab.  Patient will be transferred to [facility] for      assistance with medications.   BRIEF HISTORY AND HOSPITAL COURSE:  [name] [name] is a 55 year old black  male with a past medical history significant for coronary artery disease,  history of inferior wall MI in the past, status post PCI to RCA in June of  2002 and then subsequently in July of 2006, hypertension, history of  congestive heart failure, hypercholesterolemia, morbid obesity,  noncompliance with medication and followup.  Came to the ER complaining of  recurrent retrosternal chest pain off and on for the last 1 week.  Belly  aching localized associated with nausea and mild shortness of breath.  Patient was noted to have blood pressure of 266 systolic palpable, patient  received 3 sublingual nitro and baby aspirin and was started on IV heparin  and nitro with relief of chest pain.  Patient states he is noncompliant with  medication, ran out of medication, denies any palpitations, lightheadedness  or syncope.  Denies PND, orthopnea, leg swelling, states he cannot afford to  buy his medications.   PAST MEDICAL HISTORY:  As above.   PAST SURGICAL HISTORY:  None.   ALLERGIES:  NONE.   MEDICATIONS AT HOME:  1. He was on Lotrel 5/20 p.o. daily.  2. Enteric-coated aspirin 81 mg p.o. daily.  3. Lasix 40 mg p.o. daily.  4. Lipitor 80 mg p.o. daily.  5. He stopped his Coreg, Avapro, Imdur, and Plavix.   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  No alcohol or drug abuse.   FAMILY HISTORY:  Positive for coronary artery disease.   ON EXAMINATION:  GENERAL:  He was alert, oriented x3, in no acute distress.  VITAL SIGNS:  Blood pressure has come down to 161/94.  Pulse was 63,  regular.  HEENT:  Conjunctivae was pink.  NECK:  Supple.  Positive JVD.  LUNGS:  He has bibasilar rales.  HEART:  S1, S2  were normal.  No S3, S4, gallop.  ABDOMINAL:  Soft, obese, nontender.  EXTREMITIES:  There was no clubbing, cyanosis.  There was trace edema.   LABORATORY:  EKG done in the ER showed normal sinus rhythm, right atrial  enlargement, LV with strain pattern with inferolateral ischemia.  His other  labs:  Point of care CPK-MB was 3.1; repeat CPK-MB was 9.1.  Troponin I was  0.05 repeat was 0.29.  His CPK was 431, MB 31.6, total index 7.3; second  set: CK 387 and MB 29.8, total index 7.7; third set:  CK 375, MB 26, total  index 6.9; fourth set:  CK 279, MB 07.9, total index 2.8, trace CK is 191,  MB 2.6.  Troponin I was 2.53, 2.51, 2.83, 3.25, today it is 0.76.  His  hemoglobin A1c was 6.4.  Electrolytes:  Sodium was 143, potassium 3.2,  glucose was 112, BUN 14, creatinine 1.5.  Repeat electrolytes today:  His  BUN is 18, creatinine 1.5, potassium is 3.6.  Hemoglobin was 15.9,  hematocrit 47.6, white count of 8.0, today hemoglobin is 14, hematocrit  41.8, a white count of 6.9, platelet count is 150,000.   BRIEF HISTORY AND HOSPITAL COURSE:  The patient was directly taken to the  cath lab from the ER and subsequently underwent a left cardiac cath and  selective left and right coronary angiography and PTCA and stenting to all 4  as per procedure report.  Patient tolerated the procedure well.  There were  no complications.  Patient's sheets were pulled out the same day with no  evidence of hematoma or bruits.  Patient has been ambulating in the hallway  without any problems.  Patient's creatinine kinase and troponin I gradually  came back toward normal.  Patient did not have further anginal chest pain  during the hospital stay.  His groin is stable with no evidence of hematoma  or bruit.  Patient will be discharged home on  above medications and will be  followed up in my office in 1 week.  Patient has been advised to stop  smoking and reduce weight to which he agrees.           ______________________________  Eduardo Osier Sharyn Lull, M.D.     MNH/MEDQ  D:  03/05/2006  T:  03/05/2006  Job:  [id number]

## 2010-10-27 NOTE — Discharge Summary (Signed)
[place]  Patient:    MARIAH, HARN                       MRN: [id number] Adm. Date:  [id number] Disc. Date: [id number] Attending:  Robynn Pane                           Discharge Summary  ADMITTING DIAGNOSES: 1. Acute inferior wall myocardial infarction. 2. History of mild coronary artery disease. 3. Hypertension. 4. Tobacco abuse.  FINAL DIAGNOSES: 1. Status post acute inferior wall myocardial infarction. 2. Status post percutaneous transluminal coronary angioplasty and stenting to    right coronary artery. 3. Hypertension. 4. Hypercholesterolemia. 5. Morbid obesity. 6. Tobacco abuse.  DISCHARGE HOME MEDICATIONS: 1. Enteric-coated aspirin 325 mg one tablet daily. 2. Plavix 75 mg one tablet daily with food. 3. Altace 2.5 mg one capsule daily. 4. Toprol-XL 50 mg one tablet daily. 5. Zocor 40 mg one tablet daily at night. 6. Nitrostat 0.4 mg sublingual used as directed.  ACTIVITY:  Avoid heavy lifting, pushing, or pulling.  DIET:  Low salt/low cholesterol.  INSTRUCTIONS: 1. Post angioplasty and stent instructions have been given. 2. Patient is being scheduled for phase 2 cardiac rehab as an outpatient.  FOLLOW-UP:  With me in one week.  CONDITION AT DISCHARGE:  Stable.  BRIEF HISTORY:  Mr. Yago Ludvigsen is a 55 year old black male with past medical history significant for mild coronary artery disease, hypertension, tobacco abuse.  He came to ER complaining of retrosternal chest pressure grade 10/10 associated with diaphoresis, which started around 8 p.m. after eating a large meal.  EKG done in the ER showed normal sinus rhythm with ST elevation in II, III, aVF, and reciprocal ST depression in lateral leads.  Patient received heparin, nitrates, IV beta blockers, morphine, aspirin, and Plavix 325 mg in the ER with partial relief of chest pain.  Patient denies any nausea, vomiting, diaphoresis.  Denies any episodes of such  pain in the past. Denies palpitation, lightheadedness, or syncope.  PAST MEDICAL HISTORY:  As above.  PAST SURGICAL HISTORY:  None.  He had left cardiac catheterization a few years ago and was shown to have mild coronary artery disease.  ALLERGIES:  No known drug allergies.  MEDICATIONS: 1. Lotensin. 2. Hydrochlorothiazide 10/12.5 mg two tablets daily. 3. Norvasc 10 mg p.o. q.d.  SOCIAL HISTORY:  He is single.  Smokes less than one pack per day for 20+ years.  No history of alcohol or drug abuse.  He works for a Nurse, learning disability.  FAMILY HISTORY:  Negative for coronary artery disease.  PHYSICAL EXAMINATION:  GENERAL:  He is alert and oriented x 3, no acute distress, complaining of chest pain.  VITAL SIGNS:  Blood pressure 124/70, pulse 78.  HEENT:  Conjunctivae pink.  NECK:  Supple, no JVD, no bruit.  LUNGS:  Clear to auscultation without rhonchi or rales.  CARDIOVASCULAR:  S1, S2 were normal.  There was soft S4 gallop.  There was no S3 gallop.  ABDOMEN:  Soft, bowel sounds were present.  Nontender.  EXTREMITIES:  There was no clubbing, cyanosis, or edema.  LABORATORY DATA:  EKG done in the ER showed normal sinus rhythm with ST elevation in inferior leads and reciprocal changes in lateral leads.  Chest x-ray on June 9 showed mild vascular congestion.  His total cholesterol was 183 but LDL was elevated at 128, HDL was low at  37, triglycerides were 92.  His CPK first set was 780, MB 5.2, relative index 0.7; second set CPK 1626, MB 156.3, relative index 9.6; third set CPK 848, MB 27.2, relative index 3.2; fourth set CPK 315, MB 2.3, relative index 0.7.  His troponin was 0.04, second set 41.09, third set 15.36, fourth set 12.47. Sodium 143, potassium 2.4, chloride 106, glucose 181, BUN 21, creatinine 1.8. Repeat electrolytes on June 13 showed sodium 141, potassium 3.5, chloride 108, bicarb 27, glucose fasting 99, BUN 12, creatinine 1.1.  Hemoglobin  13.6, hematocrit 40.6, white count 14.1.  On June 13 his hemoglobin was 12.4, hematocrit 36.8, white count 7.2 - which was stable.  BRIEF HOSPITAL COURSE:  Patient was directly taken to the catheterization laboratory and had emergent PTCA and stenting to RCA as per catheterization and PTCA report.  Patient tolerated the procedure well; there were no complications.  Patient was transferred to CCU in stable condition.  Patient did not have any episodes of chest pain during the hospital stay.  His sheaths were pulled out same day. Phase 1 cardiac rehab was called.  Patient had brief episode of atrial arrhythmias and occasional PVCs and couplets during the hospital stay.  Patients groin is stable.  There is no evidence of hematoma or bruit.  A 2-D echo done yesterday showed low-normal LV systolic function. There is mild anterior wall hypokinesia, ejection fraction of 45-50% range. Patient will be discharged home on above medications and will be followed up in my office in one week.  Patient is scheduled for phase 2 cardiac rehab as an outpatient. DD:  11/22/00 TD:  11/23/00 Job: [id number]

## 2010-10-28 ENCOUNTER — Other Ambulatory Visit: Payer: Self-pay | Admitting: Cardiovascular Disease

## 2010-11-10 ENCOUNTER — Other Ambulatory Visit: Payer: Self-pay | Admitting: Cardiovascular Disease

## 2010-11-11 ENCOUNTER — Other Ambulatory Visit: Payer: Self-pay | Admitting: Cardiovascular Disease

## 2010-11-27 ENCOUNTER — Other Ambulatory Visit (INDEPENDENT_AMBULATORY_CARE_PROVIDER_SITE_OTHER): Payer: Medicare Other | Admitting: *Deleted

## 2010-11-27 DIAGNOSIS — E785 Hyperlipidemia, unspecified: Secondary | ICD-10-CM

## 2010-11-27 DIAGNOSIS — I2510 Atherosclerotic heart disease of native coronary artery without angina pectoris: Secondary | ICD-10-CM

## 2010-11-27 LAB — HEPATIC FUNCTION PANEL
ALT: 24 U/L (ref 0–53)
AST: 21 U/L (ref 0–37)
Albumin: 3.8 g/dL (ref 3.5–5.2)
Alkaline Phosphatase: 74 U/L (ref 39–117)
Total Protein: 6.7 g/dL (ref 6.0–8.3)

## 2010-12-22 ENCOUNTER — Other Ambulatory Visit: Payer: Self-pay | Admitting: Internal Medicine

## 2010-12-22 DIAGNOSIS — M25569 Pain in unspecified knee: Secondary | ICD-10-CM

## 2010-12-27 ENCOUNTER — Ambulatory Visit (HOSPITAL_COMMUNITY)
Admission: RE | Admit: 2010-12-27 | Discharge: 2010-12-27 | Disposition: A | Discharge status: Home / Self Care | Payer: Medicare Other | Source: Ambulatory Visit | Attending: Internal Medicine | Admitting: Internal Medicine

## 2010-12-27 DIAGNOSIS — M8430XA Stress fracture, unspecified site, initial encounter for fracture: Secondary | ICD-10-CM | POA: Insufficient documentation

## 2010-12-27 DIAGNOSIS — M25569 Pain in unspecified knee: Secondary | ICD-10-CM

## 2010-12-27 DIAGNOSIS — R609 Edema, unspecified: Secondary | ICD-10-CM | POA: Insufficient documentation

## 2010-12-27 DIAGNOSIS — X58XXXA Exposure to other specified factors, initial encounter: Secondary | ICD-10-CM | POA: Insufficient documentation

## 2011-01-07 ENCOUNTER — Emergency Department (HOSPITAL_COMMUNITY): Payer: Medicare Other

## 2011-01-07 ENCOUNTER — Telehealth: Payer: Self-pay | Admitting: Physician Assistant

## 2011-01-07 ENCOUNTER — Emergency Department (HOSPITAL_COMMUNITY)
Admission: EM | Admit: 2011-01-07 | Discharge: 2011-01-07 | Disposition: A | Discharge status: Home / Self Care | Payer: Medicare Other | Attending: Emergency Medicine | Admitting: Emergency Medicine

## 2011-01-07 DIAGNOSIS — R0601 Orthopnea: Secondary | ICD-10-CM | POA: Insufficient documentation

## 2011-01-07 DIAGNOSIS — R0602 Shortness of breath: Secondary | ICD-10-CM | POA: Insufficient documentation

## 2011-01-07 DIAGNOSIS — E7800 Pure hypercholesterolemia, unspecified: Secondary | ICD-10-CM | POA: Insufficient documentation

## 2011-01-07 DIAGNOSIS — I509 Heart failure, unspecified: Secondary | ICD-10-CM | POA: Insufficient documentation

## 2011-01-07 DIAGNOSIS — I10 Essential (primary) hypertension: Secondary | ICD-10-CM | POA: Insufficient documentation

## 2011-01-07 DIAGNOSIS — I252 Old myocardial infarction: Secondary | ICD-10-CM | POA: Insufficient documentation

## 2011-01-07 LAB — CBC
HCT: 43.2 % (ref 39.0–52.0)
Hemoglobin: 14.7 g/dL (ref 13.0–17.0)
MCH: 29.1 pg (ref 26.0–34.0)
MCHC: 34 g/dL (ref 30.0–36.0)
MCV: 85.5 fL (ref 78.0–100.0)
Platelets: 173 10*3/uL (ref 150–400)
RBC: 5.05 MIL/uL (ref 4.22–5.81)
RDW: 15.8 % — ABNORMAL HIGH (ref 11.5–15.5)
WBC: 6.5 10*3/uL (ref 4.0–10.5)

## 2011-01-07 LAB — DIFFERENTIAL
Basophils Absolute: 0 10*3/uL (ref 0.0–0.1)
Basophils Relative: 1 % (ref 0–1)
Eosinophils Absolute: 0.2 10*3/uL (ref 0.0–0.7)
Eosinophils Relative: 3 % (ref 0–5)
Lymphocytes Relative: 59 % — ABNORMAL HIGH (ref 12–46)
Lymphs Abs: 3.8 10*3/uL (ref 0.7–4.0)
Monocytes Absolute: 0.6 10*3/uL (ref 0.1–1.0)
Monocytes Relative: 9 % (ref 3–12)
Neutro Abs: 1.9 10*3/uL (ref 1.7–7.7)
Neutrophils Relative %: 29 % — ABNORMAL LOW (ref 43–77)

## 2011-01-07 LAB — PROTIME-INR
INR: 0.89 (ref 0.00–1.49)
Prothrombin Time: 12.2 s (ref 11.6–15.2)

## 2011-01-07 LAB — PHOSPHORUS: Phosphorus: 3.2 mg/dL (ref 2.3–4.6)

## 2011-01-07 LAB — CK TOTAL AND CKMB (NOT AT ARMC)
CK, MB: 4.6 ng/mL — ABNORMAL HIGH (ref 0.3–4.0)
Total CK: 439 U/L — ABNORMAL HIGH (ref 7–232)

## 2011-01-07 LAB — TROPONIN I: Troponin I: <0.3 ng/mL

## 2011-01-07 LAB — PRO B NATRIURETIC PEPTIDE: Pro B Natriuretic peptide (BNP): 215.9 pg/mL — ABNORMAL HIGH (ref 0–125)

## 2011-01-07 LAB — BASIC METABOLIC PANEL
CO2: 30 mEq/L (ref 19–32)
Chloride: 109 mEq/L (ref 96–112)
Sodium: 144 mEq/L (ref 135–145)

## 2011-01-07 LAB — MAGNESIUM: Magnesium: 2.1 mg/dL (ref 1.5–2.5)

## 2011-01-07 LAB — APTT: aPTT: 30 seconds (ref 24–37)

## 2011-01-23 ENCOUNTER — Encounter: Payer: Self-pay | Admitting: *Deleted

## 2011-01-23 ENCOUNTER — Encounter: Payer: Self-pay | Admitting: Physician Assistant

## 2011-01-24 ENCOUNTER — Encounter: Payer: Medicare Other | Admitting: Physician Assistant

## 2011-01-24 NOTE — Consult Note (Signed)
[name] [name], [name] [name] NO.:  [id number]  MEDICAL RECORD NO.:  [id number]  LOCATION:  [facility]                         FACILITY:  [facility]  PHYSICIAN:  [name] Sans. Witt Plitt, MD, FACCDATE OF BIRTH:  1956-01-16  DATE OF CONSULTATION: DATE OF DISCHARGE:  01/07/2011                                CONSULTATION   CHIEF COMPLAINT:  Shortness of breath "started around this morning."  I was asked by [name] [name] ED staff to consult on the patient by the name of [name] [name].  He came to emergency room this morning at about 7 o'clock with the above complaint.  He is a 55 year old gentleman who has a long history of noncompliance. He last saw [name] [name] in the office in February 2010, and was asked to return in 6 months.  I do not see a return visit.  He admits to missing 3 days worth of his medications this week.  He did, however, bring his medication bottles with him in a container.  He denies any orthopnea or PND.  He simply has noted dyspnea on exertion has gotten worse this morning.  He denies any chest pain or angina.  He had a classic angina in the past and presented with a coronary event.  His initial data here shows a blood pressure that was 180/91, heart rates in the 60s and sinus rhythm.  His respiratory rate was about 20 but not labored.  O2 sat was 93-94% on room air.  Initial laboratory data shows negative cardiac markers, potassium 3.4, otherwise unremarkable.  Chest x-ray did not show any major edema.  His BNP is elevated a little over 200.  PAST MEDICAL HISTORY:  His current meds include: 1. Pravastatin 80 mg p.o. at bedtime. 2. Clonidine 0.2 p.o. b.i.d. 3. Citalopram 40 mg p.o. daily. 4. Tamsulosin 0.4 mg everyday. 5. Carvedilol 25 mg p.o. b.i.d. 6. Losartan 100 mg p.o. daily. 7. Amlodipine 10 mg p.o. q.a.m. 8. Ibuprofen p.r.n. 9. Omega-3 fish oil daily. 10.Plavix 75 mg a day. 11.Benazepril 40 mg a day. 12.Famotidine 40 mg a day. 13.Furosemide 40 mg  every day.  Of note, I do not see aspirin here.  We will make sure that he starts that at 81 mg a day.  ALLERGIES:  He has no known drug allergies.  SOCIAL HISTORY:  He lives alone in [place].  He continues to use tobacco.  He denies any alcohol use.  FAMILY HISTORY:  Mother died of cancer.  Father died of motor vehicle accident.  There is no specific history of cardiac disease.  REVIEW OF SYSTEMS:  All systems reviewed and negative except as outlined in the history of present illness.  OTHER MEDICAL PROBLEMS: 1. Coronary artery disease status post previous PCI. 2. Ischemic cardiomyopathy, EF of about 40%. 3. Hypertension. 4. Hyperlipidemia. 5. GERD. 6. Morbid obesity. 7. Obstructive sleep apnea on CPAP.  PHYSICAL EXAMINATION:  GENERAL:  He is in no acute distress.  He is obese. VITAL SIGNS:  As recorded above. HEENT:  He has pterygium on his right eye.  Sclerae are slightly muddy. NECK:  Supple.  Carotids upstrokes were equal bilaterally without bruits.  Thyroid is not enlarged.  Trachea is  midline. LUNGS:  Clear to auscultation, few crackles in the bases. HEART:  A poorly appreciated PMI mostly because of body  somatotypes. He has a soft S1-S2.  No murmur, rub or gallop. ABDOMEN:  Obese, good bowel sounds.  No tenderness. EXTREMITIES:  Only minimal to trace edema.  Pulses were present. NEURO:  Intact. SKIN:  Warm and dry.  All laboratory data and chest x-ray, EKG reviewed.  ASSESSMENT AND PLAN:  [name] [name] has mild acute-on-chronic combined congestive heart failure.  This is because of repeated noncompliance. Even though he brought his medications in and they all have fills in the bottles, he does not take them.  This has been noted on previous evaluations in the past.  He also continues to smoke.  He does not appear to eat much salt.  PLAN: 1. IV Lasix 40 mg now. 2. Potassium 40 mEq.  I will add 20 mEq potassium to his regimen daily with a potassium rich diet  emphasized.  In addition, we will add aspirin 81 mg a day.  I note that he is taking benazepril and losartan.  For now, I will leave this alone.  I will schedule him for a followup with [name] Newcomer, PA-C in our office in next 10 days.  I have strongly urged him to keep it.  I have strongly urged him to also use a CPAP.     [name] Platts C. Daleen Squibb, MD, [facility]     TCW/MEDQ  D:  01/07/2011  T:  01/07/2011  Job:  [id number]  Electronically Signed by Valera Castle MD [facility] on 01/24/2011 10:37:58 AM

## 2011-01-29 ENCOUNTER — Other Ambulatory Visit: Payer: Self-pay | Admitting: Cardiovascular Disease

## 2011-01-29 NOTE — Telephone Encounter (Signed)
Opened wrong chart by mistake, it won't let me close it without writing something, do you think this is enough? I do.

## 2011-02-08 ENCOUNTER — Ambulatory Visit (INDEPENDENT_AMBULATORY_CARE_PROVIDER_SITE_OTHER): Payer: Medicare Other | Admitting: Physician Assistant

## 2011-02-08 ENCOUNTER — Encounter: Payer: Self-pay | Admitting: Physician Assistant

## 2011-02-08 DIAGNOSIS — I2510 Atherosclerotic heart disease of native coronary artery without angina pectoris: Secondary | ICD-10-CM

## 2011-02-08 DIAGNOSIS — R002 Palpitations: Secondary | ICD-10-CM

## 2011-02-08 DIAGNOSIS — F17200 Nicotine dependence, unspecified, uncomplicated: Secondary | ICD-10-CM

## 2011-02-08 DIAGNOSIS — E785 Hyperlipidemia, unspecified: Secondary | ICD-10-CM

## 2011-02-08 DIAGNOSIS — I10 Essential (primary) hypertension: Secondary | ICD-10-CM

## 2011-02-08 DIAGNOSIS — R0602 Shortness of breath: Secondary | ICD-10-CM

## 2011-02-08 LAB — BASIC METABOLIC PANEL
BUN: 14 mg/dL (ref 6–23)
Calcium: 9.2 mg/dL (ref 8.4–10.5)
GFR: 100.15 mL/min (ref >60.00)
Potassium: 3.8 mEq/L (ref 3.5–5.1)
Sodium: 143 mEq/L (ref 135–145)

## 2011-02-08 LAB — BRAIN NATRIURETIC PEPTIDE: Pro B Natriuretic peptide (BNP): 74 pg/mL (ref 0.0–100.0)

## 2011-02-08 NOTE — Assessment & Plan Note (Signed)
Probably PVCs.  Check electrolytes as noted.  If changes, consider event monitor.

## 2011-02-08 NOTE — Assessment & Plan Note (Signed)
Fair control.

## 2011-02-08 NOTE — Patient Instructions (Signed)
Your physician has requested that you have a lexiscan myoview DX 786.05, . For further information please visit [email or web address] Please follow instruction sheet, as given.   Your physician recommends that you return for lab work in: TODAY BMET, BNP 786.05, 414.01

## 2011-02-08 NOTE — Progress Notes (Signed)
History of Present Illness: Primary Cardiologist: Dr. Tonny Bollman  [name] [name] is a 55 y.o. male who presents for post hospital follow up.  He is a prior patient of mine from [facility].  He has a history of CAD, status post PCI to the RCA and PL1, Cutting Balloon angioplasty secondary to in-stent restenosis of the RCA in 5/08, ischemic cardiomyopathy with previous EF 40%, improved to 55-60%, hypertension and hyperlipidemia.  Last cardiac catheterization 5/08: Mid LAD 40-50%, posterolateral stent patent, distal circumflex 50%, mid RCA 80% in-stent restenosis treated with cutting balloon angioplasty, EF 55-60%.  Last echo 9/11: EF 55-60%, inferior akinesis, moderate to severe LVH, moderate LAE, PASP 33.  Last Myoview 8/11: Inferior scar with mild peri-infarct ischemia, EF 36%.  He was seen by Dr. Daleen Squibb In the emergency room 7/29 with complaints of increasing shortness of breath.  He had missed 3 days of his medications.  His blood pressure was elevated at 180/91.  His BNP was slightly elevated over 200.  Cardiac markers were negative.  D-dimer was negative.  He was given IV Lasix x1 and the emergency room and asked to followup in 10 days.  This is his first visit back to our office.  His potassium was low at 3.4 and potassium supplementation was added to his medical regimen.  He continues to note significant dyspnea with exertion.  He describes class III symptoms.  He denies syncope.  He denies chest discomfort.  He sleeps on 3 pillows.  He denies PND.  His lower extremity edema is fairly unchanged.  He has occasional palpitations.  These last just one or 2 seconds.  He denies any associated symptoms.  He does wheeze at times.  He also has a dry cough.  Of note, he is up 23 pounds since last year.  He continues to smoke cigarettes.   Past Medical History  Diagnosis Date  . Dyslipidemia   . HTN (hypertension)   . CAD (coronary artery disease)     a. s/p stent to RCA and PL1;   b. Myoview 8/11:  Inferior scar with mild peri-infarct ischemia, EF 36%;  c. cath 5/08:  Mid LAD 40-50%, posterolateral stent patent, distal circumflex 50%, mid RCA 80% in-stent restenosis treated with cutting balloon angioplasty, EF 55-60%  . Ischemic cardiomyopathy     EF 40%; improved to normal;  echo 9/11: EF 55-60%, inferior akinesis, moderate to severe LVH, moderate LAE, PASP 33  . GERD (gastroesophageal reflux disease)   . Tobacco abuse   . Obesity   . OSA (obstructive sleep apnea)     Current Outpatient Prescriptions  Medication Sig Dispense Refill  . amLODipine (NORVASC) 10 MG tablet TAKE 1 TABLET BY MOUTH EVERY MORNING  30 tablet  5  . aspirin EC 81 MG tablet Take 81 mg by mouth daily.        . benazepril (LOTENSIN) 40 MG tablet TAKE 1 TABLET BY MOUTH EVERY MORNING  30 tablet  5  . carvedilol (COREG) 25 MG tablet TAKE 1 TABLET BY MOUTH TWICE DAILY  60 tablet  5  . cloNIDine (CATAPRES) 0.2 MG tablet Take 0.2 mg by mouth 2 (two) times daily.        . famotidine (PEPCID) 40 MG tablet Take 40 mg by mouth 2 (two) times daily as needed.        . fish oil-omega-3 fatty acids 1000 MG capsule Take 2 g by mouth daily.        . fluticasone (FLONASE) 50 MCG/ACT  nasal spray Place 2 sprays into the nose daily.        . furosemide (LASIX) 40 MG tablet TAKE 1 TABLET BY MOUTH EVERY DAY  30 tablet  5  . losartan (COZAAR) 100 MG tablet TAKE ONE TABLET BY MOUTH DAILY IN PLACE OF AVAPRO  30 tablet  5  . nitroGLYCERIN (NITROSTAT) 0.4 MG SL tablet Place 0.4 mg under the tongue every 5 (five) minutes as needed. For chest pain up to 3 doses. If pain persist call emergency personal.       . PLAVIX 75 MG tablet TAKE 1 TABLET BY MOUTH EVERY DAY  30 tablet  5  . potassium chloride SA (K-DUR,KLOR-CON) 20 MEQ tablet TAKE 1 TABLET BY MOUTH EVERY DAY  90 tablet  0  . pravastatin (PRAVACHOL) 80 MG tablet Take 80 mg by mouth daily.        . Tamsulosin HCl (FLOMAX) 0.4 MG CAPS Take 0.4 mg by mouth daily.           Allergies: Allergies  Allergen Reactions  . Penicillins    Social history:Smoker  ROS:  Please see the history of present illness.  All other systems reviewed and negative.   Vital Signs: BP 138/82  Pulse 58  Ht 6' (1.829 m)  Wt 343 lb (155.584 kg)  BMI 46.52 kg/m2  PHYSICAL EXAM: Well nourished, well developed, in no acute distress HEENT: normal Neck: no JVD Cardiac:  normal S1, S2; RRR; no murmur, No S3 Lungs:  clear to auscultation bilaterally, no wheezing, rhonchi or rales Abd: soft, nontender, no hepatomegaly Ext: Trace bilateral edema Skin: warm and dry Neuro:  CNs 2-12 intact, no focal abnormalities noted  EKG:  Sinus bradycardia, heart rate 58, normal axis, inferior Q waves with T-wave inversions in leads 2, 3, aVF, T-wave inversions in V5 and V6, LVH, no significant changes  ASSESSMENT AND PLAN:

## 2011-02-08 NOTE — Assessment & Plan Note (Signed)
Etiology unclear.  He has multiple possible etiologies.  It has been a year since his last stress test.  I will set him up for a Lexiscan Myoview to rule out ischemia.  I will check a basic metabolic panel and a BNP today.  I suspect he has primarily diastolic heart failure.  If his BNP is elevated, I will adjust his Lasix.  If his cardiac workup is negative, I would focus on weight loss.  I would also suggest he have pulmonary function testing to assess for significant COPD given his long history of smoking.  This can be arranged with his PCP.  I can see him back in 3-4 weeks, or sooner as needed.

## 2011-02-08 NOTE — Assessment & Plan Note (Signed)
We discussed the need to quit.  

## 2011-02-08 NOTE — Assessment & Plan Note (Signed)
Lipids checked in 6/12 and were acceptable.

## 2011-02-08 NOTE — Assessment & Plan Note (Signed)
Continue ASA and Plavix and statin.  Get myoview as noted.

## 2011-02-19 ENCOUNTER — Ambulatory Visit (HOSPITAL_COMMUNITY): Payer: Medicare Other | Attending: Cardiovascular Disease | Admitting: Radiology

## 2011-02-19 DIAGNOSIS — R0602 Shortness of breath: Secondary | ICD-10-CM

## 2011-02-19 DIAGNOSIS — R079 Chest pain, unspecified: Secondary | ICD-10-CM

## 2011-02-19 DIAGNOSIS — I2510 Atherosclerotic heart disease of native coronary artery without angina pectoris: Secondary | ICD-10-CM

## 2011-02-19 DIAGNOSIS — I4949 Other premature depolarization: Secondary | ICD-10-CM

## 2011-02-19 DIAGNOSIS — R9431 Abnormal electrocardiogram [ECG] [EKG]: Secondary | ICD-10-CM

## 2011-02-19 DIAGNOSIS — I491 Atrial premature depolarization: Secondary | ICD-10-CM

## 2011-02-19 MED ORDER — REGADENOSON 0.4 MG/5ML IV SOLN
0.4000 mg | Freq: Once | INTRAVENOUS | Status: AC
  Administered 2011-02-19: 0.4 mg via INTRAVENOUS

## 2011-02-19 MED ORDER — TECHNETIUM TC 99M TETROFOSMIN IV KIT
33.0000 | PACK | Freq: Once | INTRAVENOUS | Status: AC | PRN
  Administered 2011-02-19: 33 via INTRAVENOUS

## 2011-02-19 NOTE — Progress Notes (Signed)
[facility] SITE 3 NUCLEAR MED [address] [place] [address] [phone]  Cardiology Nuclear Med Study  [name] [name] is a 55 y.o. male [id number] [birth year]   Nuclear Med Background Indication for Stress Test:  Evaluation for Ischemia, Stent Patency, PTCA Patency and Post Hospital History: '08 Angioplasty: , 09/11 Echo: 55-60%, 05/08 Heart Catheterization: 55-60% patent stent, '06 Myocardial Infarction: AIWMI, 08/11 Myocardial Perfusion Study: EF 36% Inf. scar and '07 Stents: OM-4 PTCA Cardiac Risk Factors: Family History - CAD, Hypertension, Lipids and Smoker  Symptoms:  Chest Pain, DOE, Palpitations and SOB   Nuclear Pre-Procedure Caffeine/Decaff Intake:  None NPO After: 7:30pm   Lungs: clear IV 0.9% NS with Angio Cath:  20g  IV Site: R Antecubital  IV Started by:  Milana Na, EMT-P  Chest Size (in):  50/51 Cup Size: n/a  Height: 6' (1.829 m)  Weight:  335 lb (151.955 kg)  BMI:  Body mass index is 45.43 kg/(m^2). Tech Comments: No Coreg 12 hrs before    Nuclear Med Study 1 or 2 day study: 2 day  Stress Test Type:  Treadmill/Lexiscan  Reading MD: Willa Rough, MD  Order Authorizing Provider:  M.Cooper  Resting Radionuclide: Technetium 71m Tetrofosmin  Resting Radionuclide Dose: 33.0 mCi   Stress Radionuclide:  Technetium 55m Tetrofosmin  Stress Radionuclide Dose: 33.0 mCi           Stress Protocol Rest HR: 59 Stress HR: 96  Rest BP: 129/77 Stress BP: 174/88  Exercise Time (min): n/a METS: n/a   Predicted Max HR: 166 bpm % Max HR: 57.83 bpm Rate Pressure Product: 46962   Dose of Adenosine (mg):  n/a Dose of Lexiscan: 0.4 mg  Dose of Atropine (mg): n/a Dose of Dobutamine: n/a mcg/kg/min (at max HR)  Stress Test Technologist: Milana Na, EMT-P  Nuclear Technologist:  Domenic Polite, CNMT     Rest Procedure:  Myocardial perfusion imaging was performed at rest 45 minutes following the intravenous administration of Technetium 109m  Tetrofosmin. Rest ECG: Sinus Bradycardia, Non Specific ST,T, changes  Stress Procedure:  The patient received IV Lexiscan 0.4 mg over 15-seconds with concurrent low level exercise and then Technetium 54m Tetrofosmin was injected at 30-seconds while the patient continued walking one more minute.  There were no significant changes and rare pvcs/pacs with Lexiscan.  Quantitative spect images were obtained after a 45-minute delay. Stress ECG: No significant change from baseline ECG  QPS Raw Data Images:  Patient motion noted; appropriate software correction applied. Stress Images:  Moderate decrease in activity in the base/mid inferior and inferolateral segments. Rest Images:  Similar to stress. Subtraction (SDS):  Slight reversibility Transient Ischemic Dilatation (Normal <1.22):  1.18 Lung/Heart Ratio (Normal <0.45):  0.36  Quantitative Gated Spect Images QGS EDV:  222 ml QGS ESV:  131 ml QGS cine images:  Hypokinesis inferior and inferlateral QGS EF: 41%  Impression Exercise Capacity:  Lexiscan with no exercise. BP Response:  Normal blood pressure response. Clinical Symptoms:  SOB ECG Impression:  No significant ST segment change suggestive of ischemia. Comparison with Prior Nuclear Study: No significant change from previous study  Overall Impression:  There is moderate scar in the base/mid inferior and inferolateral segments. There may be slight peri-infarct ischemia.   Willa Rough  .

## 2011-02-21 ENCOUNTER — Ambulatory Visit (HOSPITAL_COMMUNITY): Payer: Medicare Other | Attending: Cardiovascular Disease | Admitting: Radiology

## 2011-02-21 DIAGNOSIS — R0989 Other specified symptoms and signs involving the circulatory and respiratory systems: Secondary | ICD-10-CM

## 2011-02-21 MED ORDER — TECHNETIUM TC 99M TETROFOSMIN IV KIT
33.0000 | PACK | Freq: Once | INTRAVENOUS | Status: AC | PRN
  Administered 2011-02-21: 33 via INTRAVENOUS

## 2011-03-06 LAB — CBC
Hemoglobin: 14.8
RDW: 16.4 — ABNORMAL HIGH

## 2011-03-06 LAB — DIFFERENTIAL
Basophils Relative: 0
Eosinophils Absolute: 0.2
Eosinophils Relative: 2
Lymphocytes Relative: 40
Neutro Abs: 4
Neutrophils Relative %: 48

## 2011-03-06 LAB — POCT I-STAT, CHEM 8
Creatinine, Ser: 1.6 — ABNORMAL HIGH
Glucose, Bld: 107 — ABNORMAL HIGH
Hemoglobin: 16.3
TCO2: 28

## 2011-03-21 ENCOUNTER — Other Ambulatory Visit: Payer: Self-pay | Admitting: Cardiovascular Disease

## 2011-04-27 ENCOUNTER — Other Ambulatory Visit: Payer: Self-pay | Admitting: Cardiovascular Disease

## 2011-05-20 ENCOUNTER — Other Ambulatory Visit: Payer: Self-pay | Admitting: Cardiovascular Disease

## 2011-05-28 ENCOUNTER — Emergency Department (HOSPITAL_COMMUNITY): Payer: Medicare Other

## 2011-05-28 ENCOUNTER — Emergency Department (HOSPITAL_COMMUNITY)
Admission: EM | Admit: 2011-05-28 | Discharge: 2011-05-28 | Disposition: A | Discharge status: Home / Self Care | Payer: Medicare Other | Attending: Emergency Medicine | Admitting: Emergency Medicine

## 2011-05-28 ENCOUNTER — Other Ambulatory Visit: Payer: Self-pay

## 2011-05-28 ENCOUNTER — Encounter (HOSPITAL_COMMUNITY): Payer: Self-pay

## 2011-05-28 DIAGNOSIS — K219 Gastro-esophageal reflux disease without esophagitis: Secondary | ICD-10-CM | POA: Insufficient documentation

## 2011-05-28 DIAGNOSIS — F329 Major depressive disorder, single episode, unspecified: Secondary | ICD-10-CM | POA: Insufficient documentation

## 2011-05-28 DIAGNOSIS — F17200 Nicotine dependence, unspecified, uncomplicated: Secondary | ICD-10-CM | POA: Insufficient documentation

## 2011-05-28 DIAGNOSIS — R002 Palpitations: Secondary | ICD-10-CM

## 2011-05-28 DIAGNOSIS — F3289 Other specified depressive episodes: Secondary | ICD-10-CM | POA: Insufficient documentation

## 2011-05-28 DIAGNOSIS — Z9889 Other specified postprocedural states: Secondary | ICD-10-CM | POA: Insufficient documentation

## 2011-05-28 DIAGNOSIS — R0789 Other chest pain: Secondary | ICD-10-CM | POA: Insufficient documentation

## 2011-05-28 DIAGNOSIS — F411 Generalized anxiety disorder: Secondary | ICD-10-CM | POA: Insufficient documentation

## 2011-05-28 DIAGNOSIS — E785 Hyperlipidemia, unspecified: Secondary | ICD-10-CM | POA: Insufficient documentation

## 2011-05-28 DIAGNOSIS — I2510 Atherosclerotic heart disease of native coronary artery without angina pectoris: Secondary | ICD-10-CM | POA: Insufficient documentation

## 2011-05-28 DIAGNOSIS — R0602 Shortness of breath: Secondary | ICD-10-CM | POA: Insufficient documentation

## 2011-05-28 DIAGNOSIS — Z79899 Other long term (current) drug therapy: Secondary | ICD-10-CM | POA: Insufficient documentation

## 2011-05-28 DIAGNOSIS — I10 Essential (primary) hypertension: Secondary | ICD-10-CM | POA: Insufficient documentation

## 2011-05-28 DIAGNOSIS — Z7982 Long term (current) use of aspirin: Secondary | ICD-10-CM | POA: Insufficient documentation

## 2011-05-28 LAB — COMPREHENSIVE METABOLIC PANEL
AST: 18 U/L (ref 0–37)
BUN: 13 mg/dL (ref 6–23)
CO2: 25 mEq/L (ref 19–32)
Calcium: 9.5 mg/dL (ref 8.4–10.5)
Chloride: 105 mEq/L (ref 96–112)
Creatinine, Ser: 1 mg/dL (ref 0.50–1.35)
GFR calc Af Amer: >90 mL/min (ref >90)
GFR calc non Af Amer: 83 mL/min — ABNORMAL LOW (ref >90)
Glucose, Bld: 103 mg/dL — ABNORMAL HIGH (ref 70–99)
Total Bilirubin: 0.3 mg/dL (ref 0.3–1.2)

## 2011-05-28 LAB — PROTIME-INR: INR: 0.9 (ref 0.00–1.49)

## 2011-05-28 LAB — CBC
HCT: 48.2 % (ref 39.0–52.0)
MCH: 29.2 pg (ref 26.0–34.0)
MCHC: 34 g/dL (ref 30.0–36.0)
RDW: 15.2 % (ref 11.5–15.5)

## 2011-05-28 LAB — POCT I-STAT TROPONIN I: Troponin i, poc: 0.05 ng/mL (ref 0.00–0.08)

## 2011-05-28 NOTE — ED Notes (Signed)
Notified MD about pt's abnormal BP; pt reports that he has not taken his BP medicines and has them on him; EDP reports that he can take his home meds and recheck in 20 min

## 2011-05-28 NOTE — ED Notes (Signed)
Cardiology at bedside.

## 2011-05-28 NOTE — Consult Note (Addendum)
Cardiology Consultation Note  Patient ID: [name] [name] MRN: [id number], SOB: 26-Oct-1955 55 y.o. Date of Encounter: 05/28/2011, 2:36 PM  Primary Cardiologist: Dr. [name]  Chief Complaint: palpitations  HPI: 55 y/o M with history of CAD, HTN, ICM (previous EF 40% with most recent assessment of EF 41% by nuc 02/2011, 55-60% by echo 02/2010), OSA presented to [facility] c/o intermittent palpitations.   He was last seen in office in August and underwent nuclear stress testing on 02/19/11 showing moderate scar in the base/mid inferior and inferolateral segments with possible slight peri-infarct ischemia. He has noticed his heart infrequently "fluttering" over the last 4 days but first really noticed them yesterday morning while at the grocery store. He felt his heart racing for a few brief seconds with subsequent lightheadedness, but no CP, SOB, pre-syncope or syncope. He went on with his shopping and felt fine for the rest of the day. This morning he felt a recurrence of similar palpitations for approximately 4 minutes, but denied any associated symptoms with this episode including lightheadedness, CP, SOB, or syncope. Because of his cardiac history, he decided to go to the ER. He wasn't sure if it was just a panic attack. He did have a rough spell of depression lasting 2 weeks where he often didn't get out of bed, but states over the last week he has been steadily improving. He cut back his Celexa to 20mg  daily. He plans to follow up with Behavioral Health. He denies any suicidal or homicidal ideation.   Troponin is negative at POC. Labs otherwise unremarkable. He is hypertensive at 170/96. Does not check his BP at home, but does occasionally check it at [employer]/grocery settings and states it usually isn't so high (but doesn't remember the #). BP in the office 54/2012 was 138/82, 54/2012 129/77.  He didn't take his medicine this morning because he was in the ER, but usually is very compliant with his  medicines. He is noncompliant with CPAP. He has had some intermittent chest tightness but it is fleeting, not like his prior angina, and not exertional. He has several high steps to get into his house and has chronic unchanged dyspnea with them but no chest pain. No orthopnea, LEE, visual changes, headache, nausea or vomiting. Telemetry reveals intermittent PVCs. The patient denies any recurrent episodes of palpitations while in the ER.   Past Medical History  Diagnosis Date  . Dyslipidemia   . HTN (hypertension)   . CAD (coronary artery disease)     a. s/p PTCA/stent to prox RCA 2002. b. s/p PTCA prox RCA and DES to prox-mid RCA 2006. c. s/p PTCA/DES to OM4 2007. d. s/p PTCA/cutting balloon to RCA for ISR 10/2006 with EF 55-60% at that time (last cath). e. [name] 02/2011 showing mod scar in the base/mid inferior and inferolateral segments; there may be slight peri-infarct ischemia, EF 41%  . Ischemic cardiomyopathy     EF 40%; improved to normal;  echo 9/11: EF 55-60%, inferior akinesis, moderate to severe LVH, moderate LAE, PASP 33 with EF 41% by nuclear 02/2011  . GERD (gastroesophageal reflux disease)   . Tobacco abuse   . Obesity   . OSA (obstructive sleep apnea)   . Depression      Surgical History:  Past Surgical History  Procedure Date  . Coronary stent placement      Home Meds: Medication Sig  amLODipine (NORVASC) 10 MG tablet Take 10 mg by mouth every morning.    aspirin EC 81  MG tablet Take 81 mg by mouth daily.    benazepril (LOTENSIN) 40 MG tablet Take 40 mg by mouth every morning.    carvedilol (COREG) 25 MG tablet Take 25 mg by mouth 2 (two) times daily with a meal.    citalopram (CELEXA) 40 MG tablet Take 20 mg by mouth daily.    cloNIDine (CATAPRES) 0.2 MG tablet Take 0.2 mg by mouth 2 (two) times daily.    clopidogrel (PLAVIX) 75 MG tablet Take 75 mg by mouth daily.    famotidine (PEPCID) 40 MG tablet Take 40 mg by mouth every 12 (twelve) hours.    fish oil-omega-3  fatty acids 1000 MG capsule Take 2 g by mouth daily.    fluticasone (FLONASE) 50 MCG/ACT nasal spray Place 2 sprays into the nose daily.   furosemide (LASIX) 40 MG tablet Take 40 mg by mouth daily.    losartan (COZAAR) 100 MG tablet Take 100 mg by mouth daily.    nitroGLYCERIN (NITROSTAT) 0.4 MG SL tablet Place 0.4 mg under the tongue every 5 (five) minutes as needed. For chest pain   potassium chloride SA (K-DUR,KLOR-CON) 20 MEQ tablet Take 20 mEq by mouth daily.    pravastatin (PRAVACHOL) 80 MG tablet Take 80 mg by mouth daily.    Tamsulosin HCl (FLOMAX) 0.4 MG CAPS Take 0.4 mg by mouth daily.      Allergies:  Allergies  Allergen Reactions  . Penicillins Other (See Comments)    unknown    History   Social History  . Marital Status: Single    Spouse Name: N/A    Number of Children: N/A  . Years of Education: N/A   Occupational History  . Not on file.   Social History Main Topics  . Smoking status: Current Everyday Smoker  . Smokeless tobacco: Not on file  . Alcohol Use: No  . Drug Use: No  . Sexually Active: Not on file   Other Topics Concern  . Not on file   Social History Narrative  . No narrative on file     Family History  Problem Relation Age of Onset  . Coronary artery disease    . Depression Mother   . Cancer Mother   . Hypertension Mother     Review of Systems: General: negative for chills, fever, night sweats or weight changes. No recent surgery, immobilization aside from laying in bed (although he did still get up), or long travel. Cardiovascular: no edema, orthopnea, paroxysmal nocturnal dyspnea. Otherwise see above Dermatological: negative for rash Respiratory: occasional rare cough, no wheezing Urologic: negative for hematuria Abdominal: negative for nausea, vomiting, diarrhea, bright red blood per rectum, melena, or hematemesis Neurologic: negative for visual changes, syncope All other systems reviewed and are otherwise negative except as noted  above.  Labs:   Lab Results  Component Value Date   WBC 5.7 05/28/2011   HGB 16.4 05/28/2011   HCT 48.2 05/28/2011   MCV 85.8 05/28/2011   PLT 183 05/28/2011    Lab 05/28/11 1013  NA 140  K 3.8  CL 105  CO2 25  BUN 13  CREATININE 1.00  CALCIUM 9.5  PROT 7.8  BILITOT 0.3  ALKPHOS 92  ALT 18  AST 18  GLUCOSE 103*   POC troponin negative  Radiology/Studies:  1. Chest 2 View 05/28/2011  *RADIOLOGY REPORT*  Clinical Data: Palpitations, chest pain  CHEST - 2 VIEW  Comparison: 01/07/2011  Findings: Borderline cardiomegaly noted.  Linear atelectasis or scarring right base  is stable.  No acute infiltrate or pleural effusion.  No pulmonary edema.  Stable mild degenerative changes thoracic spine.  IMPRESSION: No active disease.  No significant change.  Original Report Authenticated By: Natasha Mead, M.D.     EKG: NSR/SB 59 bpm with NSST changes. TWI III, V6 with Q's in III and moderate LVH. This EKG actually appears improved from prior which showed more pronounced TWI II, III, avF & lateral precordial leads.  Physical Exam: Blood pressure 170/96, pulse 55, temperature 97.6 F (36.4 C), temperature source Oral, resp. rate 13, height 6' (1.829 m), weight 330 lb (149.687 kg), SpO2 98.00%. General: Well developed, well nourished, obese AAM in no acute distress. Head: Normocephalic, atraumatic, sclera non-icteric, no xanthomas, nares are without discharge.  Neck: Negative for carotid bruits. JVD not elevated. Lungs: Clear bilaterally to auscultation without wheezes, rales, or rhonchi. Breathing is unlabored. Heart: RRR with S1 S2. No murmurs, rubs, or gallops appreciated. Abdomen: Soft, non-tender, non-distended with normoactive bowel sounds. No hepatomegaly. No rebound/guarding. No obvious abdominal masses. Msk:  Strength and tone appear normal for age. Extremities: No clubbing or cyanosis. No edema.  Distal pedal pulses are 2+ and equal bilaterally. He has dry skin on his feet but no  ulcers. Neuro: Alert and oriented X 3. Moves all extremities spontaneously. Psych:  Responds to questions appropriately with a normal affect.    ASSESSMENT AND PLAN:   1. Palpitations - ddx includes arrythmia (i.e. SVT, afib) vs. anxiety. He does have occasional PVCs on telemetry, but no NSVT.  I feel this is less likely ventricular in etiology given today's duration without associated symptoms. He had 4 minutes of these palpitations this morning without associated CP, SOB, lightheadedness or syncope. Will discuss with MD re: further workup.   2. Uncontrolled HTN - the last readings in our office were 129/77 & 138/82. He has not yet taken his medicine which could account for the increase but given LVH on prior echo, would advise patient to keep BP diary outside of hospital.  3. CAD s/p multiple PCIs - troponin negative. The intermittent chest tightness he reports is somewhat atypical. Recent nuc as above. Continue ASA, BB, statin & Plavix.  4. Hx of ICM - most recent echo was in 02/2010 showing normalized EF although most recent assessment of nuclear study 02/2011 showed EF of 41%. Would consider repeat echo.  5. Depression - it appears the patient is on the upswing of a depressive episode. I have educated him on the warning signs of depression and he knows when/how to seek help if he should need it. He should continue on his current dose of Celexa & f/u with Behavioral Health as he plans to. Behavioral Health has seen him in ER>  Signed, Ronie Spies PA-C 05/28/2011, 2:36 PM  Patinet seen and examined.  Agree with findings as noted above  By D. Dunn  Patient denies CP  No SOB.  Has had 2 episodes of pain, 1 yesterday and 1 today.  Yesterday's associatted with some dizziness. On exam  BP increased   He did not take meds today. Lungs CTA  Card RRR  S1,S2.  Ext  No signif edena EKG without acute changes.  Plan:  D/C to hme   F/U with event monitor.  F/U with S weaver and [facility].  Keep BP log for  HTN. Dietrich Pates 3:47 PM

## 2011-05-28 NOTE — BH Assessment (Signed)
Assessment Note   [name] [name] is an 55 y.o. male that was self-referred to [facility] this day due to his heart fluttering by report.  Pt stated he was worried it was a heart problem since he has had one in the past, but believes it could be a panic attack as well.  Pt stated he had a similar event happen a few days ago.  Pt reported he has a long history of depression and was prescribed Celexa by his PCP earlier this year, but stopped taking it after 4 weeks because he felt better.  Pt recently started taking again 2 weeks ago, as he reports worsening depression recently.  Pt denies SI/HI or psychosis.  Pt denies SA.  Pt stated he saw a counselor earlier in the year at [facility].  Pt denies any other treatment.  Consulted with EDP Hyman Hopes who agreed outpatient referrals appropriate.  Theses were given to pt and pt to be discharged home.  Completed assessment and assessment notification.  Faxed to [facility] to log.  Axis I: Major Depressive Disorder, Recurrent, Moderate Axis II: Deferred Axis III:  Past Medical History  Diagnosis Date  . Dyslipidemia   . HTN (hypertension)   . CAD (coronary artery disease)     a. s/p stent to RCA and PL1;   b. Myoview 8/11: Inferior scar with mild peri-infarct ischemia, EF 36%;  c. cath 5/08:  Mid LAD 40-50%, posterolateral stent patent, distal circumflex 50%, mid RCA 80% in-stent restenosis treated with cutting balloon angioplasty, EF 55-60%  . Ischemic cardiomyopathy     EF 40%; improved to normal;  echo 9/11: EF 55-60%, inferior akinesis, moderate to severe LVH, moderate LAE, PASP 33  . GERD (gastroesophageal reflux disease)   . Tobacco abuse   . Obesity   . OSA (obstructive sleep apnea)   . Depression    Axis IV: other psychosocial or environmental problems and problems with primary support group Axis V: 51-60 moderate symptoms  Past Medical History:  Past Medical History  Diagnosis Date  . Dyslipidemia   . HTN (hypertension)   . CAD (coronary artery  disease)     a. s/p stent to RCA and PL1;   b. Myoview 8/11: Inferior scar with mild peri-infarct ischemia, EF 36%;  c. cath 5/08:  Mid LAD 40-50%, posterolateral stent patent, distal circumflex 50%, mid RCA 80% in-stent restenosis treated with cutting balloon angioplasty, EF 55-60%  . Ischemic cardiomyopathy     EF 40%; improved to normal;  echo 9/11: EF 55-60%, inferior akinesis, moderate to severe LVH, moderate LAE, PASP 33  . GERD (gastroesophageal reflux disease)   . Tobacco abuse   . Obesity   . OSA (obstructive sleep apnea)   . Depression     Past Surgical History  Procedure Date  . Coronary stent placement     Family History:  Family History  Problem Relation Age of Onset  . Coronary artery disease    . Depression Mother   . Cancer Mother   . Hypertension Mother     Social History:  reports that he has been smoking.  He does not have any smokeless tobacco history on file. He reports that he does not drink alcohol or use illicit drugs.  Additional Social History:  Alcohol / Drug Use Pain Medications: n/a Prescriptions: see list Over the Counter: see list History of alcohol / drug use?: No history of alcohol / drug abuse Longest period of sobriety (when/how long): n/a Negative Consequences of Use:  (  n/a) Withdrawal Symptoms:  (n/a) Allergies:  Allergies  Allergen Reactions  . Penicillins Other (See Comments)    unknown    Home Medications:  No current facility-administered medications on file as of 05/28/2011.   Medications Prior to Admission  Medication Sig Dispense Refill  . aspirin EC 81 MG tablet Take 81 mg by mouth daily.        . cloNIDine (CATAPRES) 0.2 MG tablet Take 0.2 mg by mouth 2 (two) times daily.        . fish oil-omega-3 fatty acids 1000 MG capsule Take 2 g by mouth daily.        . fluticasone (FLONASE) 50 MCG/ACT nasal spray Place 2 sprays into the nose daily.       . pravastatin (PRAVACHOL) 80 MG tablet Take 80 mg by mouth daily.        .  Tamsulosin HCl (FLOMAX) 0.4 MG CAPS Take 0.4 mg by mouth daily.          OB/GYN Status:  No LMP for male patient.  General Assessment Data Location of Assessment: [facility] ED ACT Assessment: Yes Living Arrangements: Alone Can pt return to current living arrangement?: Yes Admission Status: Voluntary Is patient capable of signing voluntary admission?: Yes Transfer from: Acute Hospital Referral Source: Self/Family/Friend  Education Status Is patient currently in school?: No  Risk to self Suicidal Ideation: No-Not Currently/Within Last 6 Months Suicidal Intent: No-Not Currently/Within Last 6 Months Is patient at risk for suicide?: No Suicidal Plan?: No-Not Currently/Within Last 6 Months Access to Means: No What has been your use of drugs/alcohol within the last 12 months?: Denies Previous Attempts/Gestures: No How many times?: 0  Other Self Harm Risks:  (n/a) Triggers for Past Attempts: Other (Comment) (n/a) Intentional Self Injurious Behavior: None Family Suicide History: No Recent stressful life event(s): Other (Comment) (denies) Persecutory voices/beliefs?: No Depression: Yes Depression Symptoms: Despondent;Insomnia;Tearfulness;Isolating;Fatigue;Loss of interest in usual pleasures;Feeling worthless/self pity Substance abuse history and/or treatment for substance abuse?: No Suicide prevention information given to non-admitted patients: Yes  Risk to Others Homicidal Ideation: No-Not Currently/Within Last 6 Months Thoughts of Harm to Others: No-Not Currently Present/Within Last 6 Months Current Homicidal Intent: No-Not Currently/Within Last 6 Months Current Homicidal Plan: No-Not Currently/Within Last 6 Months Access to Homicidal Means: No Identified Victim: n/a History of harm to others?: No Assessment of Violence: None Noted Violent Behavior Description: n/a - calm, cooperative Does patient have access to weapons?: No Criminal Charges Pending?: No Does patient have a court  date: No  Psychosis Hallucinations: None noted Delusions: None noted  Mental Status Report Appear/Hygiene: Other (Comment) (Casual) Eye Contact: Good Motor Activity: Unremarkable Speech: Logical/coherent Level of Consciousness: Alert Mood: Depressed Affect: Depressed Anxiety Level: Panic Attacks Panic attack frequency: 2 x Most recent panic attack: this morning Thought Processes: Coherent;Relevant Judgement: Unimpaired Orientation: Person;Place;Time;Situation Obsessive Compulsive Thoughts/Behaviors: None  Cognitive Functioning Concentration: Decreased Memory: Remote Intact;Recent Impaired IQ: Average Insight: Fair Impulse Control: Fair Appetite: Fair Weight Loss: 0  Weight Gain: 0  Sleep: Decreased Total Hours of Sleep: 5  Vegetative Symptoms: Staying in bed  Prior Inpatient Therapy Prior Inpatient Therapy: No Prior Therapy Dates: n/a Prior Therapy Facilty/Provider(s): n.a Reason for Treatment: n/a  Prior Outpatient Therapy Prior Outpatient Therapy: Yes Prior Therapy Dates: 2012 Prior Therapy Facilty/Provider(s): unknown name at [facility] Reason for Treatment: Depression  ADL Screening (condition at time of admission) Patient's cognitive ability adequate to safely complete daily activities?: Yes Patient able to express need for assistance with ADLs?: Yes  Independently performs ADLs?: Yes       Abuse/Neglect Assessment (Assessment to be complete while patient is alone) Physical Abuse: Denies Verbal Abuse: Denies Sexual Abuse: Denies Exploitation of patient/patient's resources: Denies Self-Neglect: Denies Values / Beliefs Cultural Requests During Hospitalization: None Spiritual Requests During Hospitalization: None Consults Spiritual Care Consult Needed: No Social Work Consult Needed: No Merchant navy officer (For Healthcare) Advance Directive: Patient does not have advance directive;Patient would not like information    Additional Information 1:1  In Past 12 Months?: No CIRT Risk: No Elopement Risk: No Does patient have medical clearance?: Yes     Disposition:  Disposition Disposition of Patient: Referred to;Outpatient treatment Type of outpatient treatment: Adult Patient referred to: Other (Comment) (Pt given OP referrals and to be discharged home)  On Site Evaluation by:   Reviewed with Physician:  Aleda Grana, Rennis Harding 05/28/2011 1:10 PM

## 2011-05-28 NOTE — ED Notes (Signed)
Pt. Developed heart racing and chest tightness this am,.  Denies any  N/V.  Has intermiittent sob.  Denies chest pain

## 2011-05-28 NOTE — ED Provider Notes (Signed)
History     CSN: [id number] Arrival date & time: 05/28/2011  9:41 AM   First MD Initiated Contact with Patient 05/28/11 1109      Chief Complaint  Patient presents with  . Irregular Heart Beat     HPI  55yoM h/o CAD s/p stent, CHF EF 36%, depression, anxiety pw chest tightness, palpitations. The patient complains of 4 days of intermittent palpitations. Palpitations have been worse today. He is having several episodes per day. He complains of constant retrosternal chest tightness over the past few days as well. Intermittent shortness of breath with exertion. He denies true chest pain. He denies nausea, vomiting, diaphoresis. He does state that he has not experienced these symptoms in the past. Denies fever/chills/cough. He states that he does have anxiety and that his chest tightness could be related to his anxiety. He also has a history of depression and states that the past 2 weeks he has had worsening of his depression but states "I'm coming out of it".   Denies h/o VTE in self or family. No recent hosp/surg/immob (although has been in bed more than usual due to his depression). No h/o cancer. Denies exogenous hormone use, no leg pain or swelling. He is requesting to speak with a SW for outpatient referrals. He denies suicidal ideation, homicidal ideation.   Has been compliant with plavix   Past Medical History  Diagnosis Date  . Dyslipidemia   . HTN (hypertension)   . CAD (coronary artery disease)     a. s/p PTCA/stent to prox RCA 2002. b. s/p PTCA prox RCA and DES to prox-mid RCA 2006. c. s/p PTCA/DES to OM4 2007. d. s/p PTCA/cutting balloon to RCA for ISR 10/2006 with EF 55-60% at that time (last cath). e. [name] 02/2011 showing mod scar in the base/mid inferior and inferolateral segments; there may be slight peri-infarct ischemia, EF 41%  . Ischemic cardiomyopathy     EF 40%; improved to normal;  echo 9/11: EF 55-60%, inferior akinesis, moderate to severe LVH, moderate LAE, PASP 33  with EF 41% by nuclear 02/2011  . GERD (gastroesophageal reflux disease)   . Tobacco abuse   . Obesity   . OSA (obstructive sleep apnea)     Does not use CPAP as of 05/2011  . Depression     Past Surgical History  Procedure Date  . Coronary stent placement     Family History  Problem Relation Age of Onset  . Coronary artery disease    . Depression Mother   . Cancer Mother   . Hypertension Mother     History  Substance Use Topics  . Smoking status: Current Everyday Smoker  . Smokeless tobacco: Not on file  . Alcohol Use: No      Review of Systems  All other systems reviewed and are negative.   except as noted HPI   Allergies  Penicillins  Home Medications   Current Outpatient Rx  Name Route Sig Dispense Refill  . AMLODIPINE BESYLATE 10 MG PO TABS Oral Take 10 mg by mouth every morning.      . ASPIRIN EC 81 MG PO TBEC Oral Take 81 mg by mouth daily.      [name] BENAZEPRIL HCL 40 MG PO TABS Oral Take 40 mg by mouth every morning.      [name] CARVEDILOL 25 MG PO TABS Oral Take 25 mg by mouth 2 (two) times daily with a meal.      . CITALOPRAM HYDROBROMIDE 40 MG PO  TABS Oral Take 20 mg by mouth daily.      [name] CLONIDINE HCL 0.2 MG PO TABS Oral Take 0.2 mg by mouth 2 (two) times daily.      [name] CLOPIDOGREL BISULFATE 75 MG PO TABS Oral Take 75 mg by mouth daily.      [name] FAMOTIDINE 40 MG PO TABS Oral Take 40 mg by mouth every 12 (twelve) hours.      . OMEGA-3 FATTY ACIDS 1000 MG PO CAPS Oral Take 2 g by mouth daily.      [name] FLUTICASONE PROPIONATE 50 MCG/ACT NA SUSP Nasal Place 2 sprays into the nose daily.     . FUROSEMIDE 40 MG PO TABS Oral Take 40 mg by mouth daily.      [name] LOSARTAN POTASSIUM 100 MG PO TABS Oral Take 100 mg by mouth daily.      [name] NITROGLYCERIN 0.4 MG SL SUBL Sublingual Place 0.4 mg under the tongue every 5 (five) minutes as needed. For chest pain     . POTASSIUM CHLORIDE CRYS CR 20 MEQ PO TBCR Oral Take 20 mEq by mouth daily.      [name] PRAVASTATIN SODIUM 80 MG PO TABS  Oral Take 80 mg by mouth daily.      [name] TAMSULOSIN HCL 0.4 MG PO CAPS Oral Take 0.4 mg by mouth daily.        BP 170/96  Pulse 55  Temp(Src) 97.6 F (36.4 C) (Oral)  Resp 13  Ht 6' (1.829 m)  Wt 330 lb (149.687 kg)  BMI 44.76 kg/m2  SpO2 98%  Physical Exam  Nursing note and vitals reviewed. Constitutional: He is oriented to person, place, and time. He appears well-developed and well-nourished. No distress.  HENT:  Head: Atraumatic.  Mouth/Throat: Oropharynx is clear and moist.  Eyes: Conjunctivae are normal. Pupils are equal, round, and reactive to light.  Neck: Neck supple.  Cardiovascular: Normal rate, regular rhythm, normal heart sounds and intact distal pulses.  Exam reveals no gallop and no friction rub.   No murmur heard. Pulmonary/Chest: Effort normal. No respiratory distress. He has no wheezes. He has no rales.  Abdominal: Soft. Bowel sounds are normal. There is no tenderness. There is no rebound and no guarding.  Musculoskeletal: Normal range of motion. He exhibits no edema and no tenderness.  Neurological: He is alert and oriented to person, place, and time.  Skin: Skin is warm and dry.  Psychiatric: He has a normal mood and affect.    Date: 05/28/2011  Rate: 59  Rhythm: sinus bradycardia  QRS Axis: normal  Intervals: normal  ST/T Wave abnormalities: nonspecific T wave changes  Conduction Disutrbances:none  Narrative Interpretation:   Old EKG Reviewed: none available  ED Course  Procedures (including critical care time)  Labs Reviewed  COMPREHENSIVE METABOLIC PANEL - Abnormal; Notable for the following:    Glucose, Bld 103 (*)    GFR calc non Af Amer 83 (*)    All other components within normal limits  CBC  PROTIME-INR  POCT I-STAT TROPONIN I   Dg Chest 2 View  05/28/2011  *RADIOLOGY REPORT*  Clinical Data: Palpitations, chest pain  CHEST - 2 VIEW  Comparison: 01/07/2011  Findings: Borderline cardiomegaly noted.  Linear atelectasis or scarring right  base is stable.  No acute infiltrate or pleural effusion.  No pulmonary edema.  Stable mild degenerative changes thoracic spine.  IMPRESSION: No active disease.  No significant change.  Original Report Authenticated By: Natasha Mead, M.D.  1. Palpitations     MDM  Since with palpitations, atypical chest tightness which he feels is associated with anxiety. Worsening depression which is improving per patient. Labs reviewed and unremarkable. EKG is also unremarkable. The patient has been seen and evaluated by cardiology recommending no further workup from a cardiac standpoint at this time. Only PVCs on the monitor here in the emergency department he'll follow with his cardiologist, Dr. [name] for further workup and evaluation if needed, if it monitor. Social work has seen the patient as well and given the patient outpatient resources to followup regarding his depression. The patient understands his instructions for discharge and agrees with the plan. Given strict precautions for return.        Forbes Cellar, MD 05/28/11 [phone]

## 2011-05-28 NOTE — ED Notes (Signed)
Called lab stated have to redraw for lab test Doctor notified.

## 2011-05-30 ENCOUNTER — Telehealth: Payer: Self-pay

## 2011-05-30 ENCOUNTER — Other Ambulatory Visit: Payer: Self-pay | Admitting: Cardiovascular Disease

## 2011-05-30 NOTE — Telephone Encounter (Signed)
Patient call back to make appt. For 05/31/11

## 2011-05-31 ENCOUNTER — Encounter (INDEPENDENT_AMBULATORY_CARE_PROVIDER_SITE_OTHER): Payer: Medicare Other

## 2011-05-31 DIAGNOSIS — R002 Palpitations: Secondary | ICD-10-CM

## 2011-06-18 ENCOUNTER — Encounter: Payer: Medicare Other | Admitting: Physician Assistant

## 2011-07-10 ENCOUNTER — Telehealth: Payer: Self-pay

## 2011-07-10 NOTE — Telephone Encounter (Signed)
06/28/11 at 1357 #[phone]--phone out of network. 07/10/11 at 1214 #[phone]--invalid number.  I have placed a letter in the mail for the pt to contact our office about the results of heart monitor.   06/25/11 Interpretation of monitor by Dr [name]. Sinus Rhythm with PVC's.  No sustained arrhythmia or pathologic pauses.

## 2011-07-30 ENCOUNTER — Telehealth: Payer: Self-pay | Admitting: Cardiovascular Disease

## 2011-07-30 NOTE — Telephone Encounter (Signed)
New msg Pt was calling back about test results. He gave new phone number

## 2011-07-30 NOTE — Telephone Encounter (Signed)
Pt aware of heart monitor results.

## 2011-08-02 ENCOUNTER — Encounter (HOSPITAL_COMMUNITY): Payer: Self-pay | Admitting: *Deleted

## 2011-08-02 ENCOUNTER — Emergency Department (HOSPITAL_COMMUNITY)
Admission: EM | Admit: 2011-08-02 | Discharge: 2011-08-03 | Disposition: A | Discharge status: Home / Self Care | Payer: Medicare HMO | Attending: Emergency Medicine | Admitting: Emergency Medicine

## 2011-08-02 ENCOUNTER — Other Ambulatory Visit: Payer: Self-pay

## 2011-08-02 DIAGNOSIS — R0602 Shortness of breath: Secondary | ICD-10-CM | POA: Insufficient documentation

## 2011-08-02 DIAGNOSIS — I10 Essential (primary) hypertension: Secondary | ICD-10-CM | POA: Insufficient documentation

## 2011-08-02 DIAGNOSIS — J189 Pneumonia, unspecified organism: Secondary | ICD-10-CM

## 2011-08-02 DIAGNOSIS — I2510 Atherosclerotic heart disease of native coronary artery without angina pectoris: Secondary | ICD-10-CM | POA: Insufficient documentation

## 2011-08-02 DIAGNOSIS — Z79899 Other long term (current) drug therapy: Secondary | ICD-10-CM | POA: Insufficient documentation

## 2011-08-02 MED ORDER — ALBUTEROL SULFATE (5 MG/ML) 0.5% IN NEBU
5.0000 mg | INHALATION_SOLUTION | Freq: Once | RESPIRATORY_TRACT | Status: AC
  Administered 2011-08-02: 5 mg via RESPIRATORY_TRACT

## 2011-08-02 MED ORDER — ALBUTEROL SULFATE (5 MG/ML) 0.5% IN NEBU
INHALATION_SOLUTION | RESPIRATORY_TRACT | Status: AC
  Administered 2011-08-02: 5 mg via RESPIRATORY_TRACT
  Filled 2011-08-02: qty 1

## 2011-08-02 NOTE — ED Notes (Signed)
[id number]<QI> Expected date:08/02/11<BR> Expected time:11:21 PM<BR> Means of arrival:Ambulance<BR> Comments:<BR> EMS 241 GC - flu symptoms

## 2011-08-02 NOTE — ED Notes (Signed)
Per EMS:  PT developed fever around 1500, has been week/lethargic.  Was complaining of SOB, pt is 97% on [place].  Pt has had chills.  Pt called b/c he became very diaphoretic (pt was concerned due to hx of MI).   Pt took 3 baby aspirin.

## 2011-08-02 NOTE — ED Provider Notes (Addendum)
History     CSN: [id number]  Arrival date & time 08/02/11  2320   First MD Initiated Contact with Patient 08/02/11 2352      Chief Complaint  Patient presents with  . Influenza  . Shortness of Breath    (Consider location/radiation/quality/duration/timing/severity/associated sxs/prior treatment) Patient is a 56 y.o. male presenting with cough. The history is provided by the patient.  Cough This is a new problem. Episode onset: earlier yest am. The problem occurs constantly. The problem has been rapidly worsening. The cough is non-productive. Maximum temperature: didn't check but felt like it. Associated symptoms include sweats and shortness of breath. Pertinent negatives include no chest pain, no headaches, no sore throat and no wheezing. He has tried nothing for the symptoms. He is a smoker.  Pt started sweating a lot tonight and he became concerned about his heart.  He did not have chest pain like he did though with prior heart problems.  Past Medical History  Diagnosis Date  . Dyslipidemia   . HTN (hypertension)   . CAD (coronary artery disease)     a. s/p PTCA/stent to prox RCA 2002. b. s/p PTCA prox RCA and DES to prox-mid RCA 2006. c. s/p PTCA/DES to OM4 2007. d. s/p PTCA/cutting balloon to RCA for ISR 10/2006 with EF 55-60% at that time (last cath). e. [name] 02/2011 showing mod scar in the base/mid inferior and inferolateral segments; there may be slight peri-infarct ischemia, EF 41%  . Ischemic cardiomyopathy     EF 40%; improved to normal;  echo 9/11: EF 55-60%, inferior akinesis, moderate to severe LVH, moderate LAE, PASP 33 with EF 41% by nuclear 02/2011  . GERD (gastroesophageal reflux disease)   . Tobacco abuse   . Obesity   . OSA (obstructive sleep apnea)     Does not use CPAP as of 05/2011  . Depression     Past Surgical History  Procedure Date  . Coronary stent placement     Family History  Problem Relation Age of Onset  . Coronary artery disease    .  Depression Mother   . Cancer Mother   . Hypertension Mother   . Other Father     motor vehicle accident    History  Substance Use Topics  . Smoking status: Current Everyday Smoker  . Smokeless tobacco: Not on file  . Alcohol Use: No      Review of Systems  HENT: Negative for sore throat.   Respiratory: Positive for cough and shortness of breath. Negative for wheezing.   Cardiovascular: Negative for chest pain.  Neurological: Negative for headaches.    Allergies  Penicillins  Home Medications   Current Outpatient Rx  Name Route Sig Dispense Refill  . ASPIRIN EC 81 MG PO TBEC Oral Take 81 mg by mouth daily.      [name] AMLODIPINE BESYLATE 10 MG PO TABS Oral Take 10 mg by mouth every morning.      [name] BENAZEPRIL HCL 40 MG PO TABS Oral Take 40 mg by mouth every morning.      [name] CARVEDILOL 25 MG PO TABS Oral Take 25 mg by mouth 2 (two) times daily with a meal.      . CITALOPRAM HYDROBROMIDE 40 MG PO TABS Oral Take 20 mg by mouth daily.      [name] CLONIDINE HCL 0.2 MG PO TABS Oral Take 0.2 mg by mouth 2 (two) times daily.      [name] CLOPIDOGREL BISULFATE 75 MG PO TABS  Oral Take 75 mg by mouth daily.      [name] FAMOTIDINE 40 MG PO TABS Oral Take 40 mg by mouth every 12 (twelve) hours.      . OMEGA-3 FATTY ACIDS 1000 MG PO CAPS Oral Take 2 g by mouth daily.      [name] FLUTICASONE PROPIONATE 50 MCG/ACT NA SUSP Nasal Place 2 sprays into the nose daily.     . FUROSEMIDE 40 MG PO TABS Oral Take 40 mg by mouth daily.      [name] LOSARTAN POTASSIUM 100 MG PO TABS Oral Take 100 mg by mouth daily.      [name] NITROGLYCERIN 0.4 MG SL SUBL Sublingual Place 0.4 mg under the tongue every 5 (five) minutes as needed. For chest pain     . POTASSIUM CHLORIDE CRYS ER 20 MEQ PO TBCR Oral Take 20 mEq by mouth daily.      [name] PRAVASTATIN SODIUM 80 MG PO TABS  TAKE 1 TABLET BY MOUTH EVERY NIGHT AT BEDTIME 30 tablet 7  . TAMSULOSIN HCL 0.4 MG PO CAPS Oral Take 0.4 mg by mouth daily.        BP 151/80  Pulse 63  Temp(Src) 98.7 F  (37.1 C) (Oral)  Resp 16  SpO2 94%  Physical Exam  Nursing note and vitals reviewed. Constitutional: He appears well-developed and well-nourished. No distress.  HENT:  Head: Normocephalic and atraumatic.  Right Ear: External ear normal.  Left Ear: External ear normal.  Eyes: Conjunctivae are normal. Right eye exhibits no discharge. Left eye exhibits no discharge. No scleral icterus.  Neck: Neck supple. No tracheal deviation present.  Cardiovascular: Normal rate, regular rhythm and intact distal pulses.   Pulmonary/Chest: Effort normal and breath sounds normal. No stridor. No respiratory distress. He has no wheezes. He has no rales.  Abdominal: Soft. Bowel sounds are normal. He exhibits no distension. There is no tenderness. There is no rebound and no guarding.  Musculoskeletal: He exhibits no edema and no tenderness.  Neurological: He is alert. He has normal strength. No sensory deficit. Cranial nerve deficit:  no gross defecits noted. He exhibits normal muscle tone. He displays no seizure activity. Coordination normal.  Skin: Skin is warm and dry. No rash noted.  Psychiatric: He has a normal mood and affect.    ED Course  Procedures (including critical care time)  Date: 08/02/2011  Rate: 64  Rhythm: normal sinus rhythm supraventricular bigeminy QRS Axis: normal  Intervals: normal  ST/T Wave abnormalities: LVH with repolarization abnormality, inverted T waves laterally and inferiorly  Conduction Disutrbances:none  Narrative Interpretation:   Old EKG Reviewed: changes noted repolarization abnormality and T-wave changes are new compared to prior EKG   Labs Reviewed  PRO B NATRIURETIC PEPTIDE - Abnormal; Notable for the following:    Pro B Natriuretic peptide (BNP) 349.7 (*)    All other components within normal limits  BASIC METABOLIC PANEL - Abnormal; Notable for the following:    Potassium 3.4 (*)    Glucose, Bld 176 (*)    GFR calc non Af Amer 74 (*)    GFR calc Af Amer 85  (*)    All other components within normal limits  CBC - Abnormal; Notable for the following:    RDW 15.7 (*)    All other components within normal limits  POCT I-STAT TROPONIN I   Dg Chest 2 View  08/03/2011  *RADIOLOGY REPORT*  Clinical Data: Shortness of breath.  CHEST - 2 VIEW  Comparison: Chest  radiograph performed 05/28/2011  Findings: The lungs are well-aerated.  Vascular congestion is noted.  Minimal bibasilar and perihilar airspace opacities, more prominent than on the prior study, may reflect mild pneumonia; edema is thought less likely.  There is no evidence of pleural effusion or pneumothorax.  The heart is borderline normal in size; the mediastinal contour is within normal limits.  No acute osseous abnormalities are seen.  IMPRESSION: Minimal bibasilar and perihilar airspace opacities may reflect mild pneumonia; despite vascular congestion, edema is thought less likely.  Original Report Authenticated By: Tonia Ghent, M.D.    MDM  CXR suggests possible pna.  Pt has been having cough and has felt feverish.  Non toxic.    Vital signs stable.  No wheezing in the ED.  Will dc home on oral abx and treat for CAP.        Celene Kras, MD 08/03/11 1610  Celene Kras, MD 08/15/11 1726

## 2011-08-03 ENCOUNTER — Emergency Department (HOSPITAL_COMMUNITY): Payer: Medicare HMO

## 2011-08-03 LAB — CBC
HCT: 45.9 % (ref 39.0–52.0)
MCHC: 34.2 g/dL (ref 30.0–36.0)
Platelets: 169 10*3/uL (ref 150–400)
RDW: 15.7 % — ABNORMAL HIGH (ref 11.5–15.5)
WBC: 7.6 10*3/uL (ref 4.0–10.5)

## 2011-08-03 LAB — BASIC METABOLIC PANEL
BUN: 12 mg/dL (ref 6–23)
Calcium: 9.1 mg/dL (ref 8.4–10.5)
Creatinine, Ser: 1.11 mg/dL (ref 0.50–1.35)
GFR calc Af Amer: 85 mL/min — ABNORMAL LOW (ref >90)
GFR calc non Af Amer: 74 mL/min — ABNORMAL LOW (ref >90)

## 2011-08-03 LAB — POCT I-STAT TROPONIN I

## 2011-08-03 LAB — PRO B NATRIURETIC PEPTIDE: Pro B Natriuretic peptide (BNP): 349.7 pg/mL — ABNORMAL HIGH (ref 0–125)

## 2011-08-03 MED ORDER — ALBUTEROL SULFATE (5 MG/ML) 0.5% IN NEBU
5.0000 mg | INHALATION_SOLUTION | Freq: Once | RESPIRATORY_TRACT | Status: AC
  Administered 2011-08-03: 5 mg via RESPIRATORY_TRACT
  Filled 2011-08-03: qty 1

## 2011-08-03 MED ORDER — MOXIFLOXACIN HCL 400 MG PO TABS: 400.0000 mg | ORAL_TABLET | Freq: Every day | ORAL | Status: AC

## 2011-08-03 MED ORDER — MOXIFLOXACIN HCL 400 MG PO TABS
400.0000 mg | ORAL_TABLET | Freq: Once | ORAL | Status: DC
  Filled 2011-08-03: qty 1

## 2011-08-03 MED ORDER — IPRATROPIUM BROMIDE 0.02 % IN SOLN
0.5000 mg | Freq: Once | RESPIRATORY_TRACT | Status: AC
  Administered 2011-08-03: 0.5 mg via RESPIRATORY_TRACT
  Filled 2011-08-03: qty 2.5

## 2011-08-03 NOTE — Discharge Instructions (Signed)

## 2011-08-19 ENCOUNTER — Other Ambulatory Visit: Payer: Self-pay | Admitting: Physician Assistant

## 2011-08-20 ENCOUNTER — Telehealth: Payer: Self-pay

## 2011-08-20 NOTE — Telephone Encounter (Signed)
..   Requested Prescriptions   Pending Prescriptions Disp Refills  . fluticasone (FLONASE) 50 MCG/ACT nasal spray [Pharmacy Med Name: FLUTICASONE NASAL SP (120INH)]      Sig: INSTILL 2 SPRAYS INTO EACH NOSTRIL EVERY DAY  Patient needs to make an appointment

## 2011-08-21 NOTE — Telephone Encounter (Signed)
oo

## 2011-09-15 ENCOUNTER — Encounter (HOSPITAL_COMMUNITY): Payer: Self-pay | Admitting: Emergency Medicine

## 2011-09-15 ENCOUNTER — Observation Stay (HOSPITAL_COMMUNITY): Payer: Medicare HMO

## 2011-09-15 ENCOUNTER — Emergency Department (HOSPITAL_COMMUNITY): Payer: Medicare HMO

## 2011-09-15 ENCOUNTER — Observation Stay (HOSPITAL_COMMUNITY)
Admission: EM | Admit: 2011-09-15 | Discharge: 2011-09-15 | Disposition: A | Discharge status: Home / Self Care | Payer: Medicare HMO | Attending: Emergency Medicine | Admitting: Emergency Medicine

## 2011-09-15 ENCOUNTER — Other Ambulatory Visit: Payer: Self-pay

## 2011-09-15 DIAGNOSIS — R079 Chest pain, unspecified: Secondary | ICD-10-CM

## 2011-09-15 DIAGNOSIS — F29 Unspecified psychosis not due to a substance or known physiological condition: Secondary | ICD-10-CM | POA: Insufficient documentation

## 2011-09-15 DIAGNOSIS — R209 Unspecified disturbances of skin sensation: Secondary | ICD-10-CM

## 2011-09-15 DIAGNOSIS — R0602 Shortness of breath: Principal | ICD-10-CM | POA: Insufficient documentation

## 2011-09-15 DIAGNOSIS — R410 Disorientation, unspecified: Secondary | ICD-10-CM

## 2011-09-15 DIAGNOSIS — R4182 Altered mental status, unspecified: Secondary | ICD-10-CM | POA: Insufficient documentation

## 2011-09-15 DIAGNOSIS — G459 Transient cerebral ischemic attack, unspecified: Secondary | ICD-10-CM

## 2011-09-15 DIAGNOSIS — I10 Essential (primary) hypertension: Secondary | ICD-10-CM | POA: Insufficient documentation

## 2011-09-15 DIAGNOSIS — R0600 Dyspnea, unspecified: Secondary | ICD-10-CM

## 2011-09-15 LAB — BASIC METABOLIC PANEL WITH GFR
BUN: 11 mg/dL (ref 6–23)
Calcium: 9.2 mg/dL (ref 8.4–10.5)
GFR calc Af Amer: >90 mL/min (ref >90)
GFR calc non Af Amer: >90 mL/min (ref >90)
Potassium: 3.9 meq/L (ref 3.5–5.1)

## 2011-09-15 LAB — DIFFERENTIAL
Basophils Absolute: 0 10*3/uL (ref 0.0–0.1)
Basophils Relative: 0 % (ref 0–1)
Eosinophils Absolute: 0.2 K/uL (ref 0.0–0.7)
Eosinophils Relative: 2 % (ref 0–5)
Lymphocytes Relative: 64 % — ABNORMAL HIGH (ref 12–46)
Lymphs Abs: 4.2 10*3/uL — ABNORMAL HIGH (ref 0.7–4.0)
Monocytes Absolute: 0.6 10*3/uL (ref 0.1–1.0)
Monocytes Relative: 10 % (ref 3–12)
Neutro Abs: 1.5 10*3/uL — ABNORMAL LOW (ref 1.7–7.7)
Neutrophils Relative %: 24 % — ABNORMAL LOW (ref 43–77)

## 2011-09-15 LAB — BASIC METABOLIC PANEL
CO2: 29 mEq/L (ref 19–32)
Chloride: 106 mEq/L (ref 96–112)
Creatinine, Ser: 0.95 mg/dL (ref 0.50–1.35)
Glucose, Bld: 96 mg/dL (ref 70–99)
Sodium: 141 mEq/L (ref 135–145)

## 2011-09-15 LAB — CBC
HCT: 44.9 % (ref 39.0–52.0)
Hemoglobin: 15.2 g/dL (ref 13.0–17.0)
MCH: 28.9 pg (ref 26.0–34.0)
MCHC: 33.9 g/dL (ref 30.0–36.0)
MCV: 85.4 fL (ref 78.0–100.0)
Platelets: 190 K/uL (ref 150–400)
RBC: 5.26 MIL/uL (ref 4.22–5.81)
RDW: 16.1 % — ABNORMAL HIGH (ref 11.5–15.5)
WBC: 6.5 10*3/uL (ref 4.0–10.5)

## 2011-09-15 LAB — PROTIME-INR
INR: 0.92 (ref 0.00–1.49)
Prothrombin Time: 12.6 seconds (ref 11.6–15.2)

## 2011-09-15 LAB — POCT I-STAT TROPONIN I: Troponin i, poc: 0.04 ng/mL (ref 0.00–0.08)

## 2011-09-15 LAB — HEMOGLOBIN A1C
Hgb A1c MFr Bld: 7 % — ABNORMAL HIGH (ref <5.7)
Mean Plasma Glucose: 154 mg/dL — ABNORMAL HIGH (ref <117)

## 2011-09-15 LAB — LIPID PANEL
Cholesterol: 224 mg/dL — ABNORMAL HIGH (ref 0–200)
HDL: 45 mg/dL (ref >39)
LDL Cholesterol: 167 mg/dL — ABNORMAL HIGH (ref 0–99)
Total CHOL/HDL Ratio: 5 ratio
Triglycerides: 59 mg/dL (ref <150)
VLDL: 12 mg/dL (ref 0–40)

## 2011-09-15 LAB — APTT: aPTT: 31 seconds (ref 24–37)

## 2011-09-15 MED ORDER — NICOTINE 21 MG/24HR TD PT24
21.0000 mg | MEDICATED_PATCH | Freq: Once | TRANSDERMAL | Status: DC
  Administered 2011-09-15: 21 mg via TRANSDERMAL
  Filled 2011-09-15: qty 1

## 2011-09-15 MED ORDER — ASPIRIN 325 MG PO TABS
325.0000 mg | ORAL_TABLET | Freq: Every day | ORAL | Status: DC
  Administered 2011-09-15: 325 mg via ORAL
  Filled 2011-09-15: qty 1

## 2011-09-15 MED ORDER — SODIUM CHLORIDE 0.9 % IV SOLN
1000.0000 mL | INTRAVENOUS | Status: DC
  Administered 2011-09-15: 1000 mL via INTRAVENOUS

## 2011-09-15 MED ORDER — IOHEXOL 350 MG/ML SOLN
50.0000 mL | Freq: Once | INTRAVENOUS | Status: AC | PRN
  Administered 2011-09-15: 50 mL via INTRAVENOUS

## 2011-09-15 NOTE — ED Provider Notes (Signed)
He has remained asymptomatic here. MRI not possible to obtain due to patient's size. CT angio done and is negative for stenosis. Discussed with Dr. Roseanne Reno (Neurology).  Will discharge home to follow up with his doctor this week for recheck.   Rodena Medin, PA-C 09/15/11 2030

## 2011-09-15 NOTE — Progress Notes (Signed)
  Echocardiogram 2D Echocardiogram has been performed.  [place] Ice, Real Cons 09/15/2011, 5:39 PM

## 2011-09-15 NOTE — ED Notes (Signed)
Candace, Doppler Tech, to page 2-D Echo Tech to advise pt has arrived to CDU. Pt aware.

## 2011-09-15 NOTE — ED Notes (Signed)
Pt started with sob yesterday that was just exertional (recent hx of pnx)-- today sob is constant and pt sts he feels confused

## 2011-09-15 NOTE — Progress Notes (Signed)
VASCULAR LAB PRELIMINARY  PRELIMINARY  PRELIMINARY  PRELIMINARY  Carotid Dopplers completed.    Preliminary report: No significant ICA stenosis noted.  Vertebral arteries note insonated.  [name] [name], 09/15/2011, 4:06 PM

## 2011-09-15 NOTE — Progress Notes (Signed)
Venous abg ran by RT, however I consulted MD due to the PO2 reading - sample appears arterial - MD aware and Ok with results

## 2011-09-15 NOTE — ED Provider Notes (Signed)
History     CSN: [id number]  Arrival date & time 09/15/11  1157   First MD Initiated Contact with Patient 09/15/11 1207      Chief Complaint  Patient presents with  . Shortness of Breath  . Altered Mental Status    (Consider location/radiation/quality/duration/timing/severity/associated sxs/prior treatment) HPI Comments: Patient reports that he is currently feeling back to baseline and seems to occur at about the time that he was put on oxygen by nasal cannula. Patient reports that he had been feeling his usual baseline, yesterday she reports she forgot to take all of his morning medications. He reports he did take his evening medications and he remembers having a brief episode of mid central nonradiating chest pressure that lasted seconds to no more than 1 minute and resolved on its own after he had walked up a few stairs. Patient reports he does not usually have cardiac angina. He does have a significant history of 3 heart attacks as well as congestive heart failure followed by Dr. [name]. He reports around that time he did have some tingling and numb sensation to both hands. This also resolved on its own. This morning, the patient did take his usual morning medications but then again later this morning he had the same sensation of numbness and tingling and he felt somewhat heaviness in his left C. but not the right. During this time he reports feeling confused. He reports that he had difficulty calling on the telephone and he felt short of breath and he was trying to call 911. He reports when they asked him his address he first gave them his old address and he couldn't remember his birthday. He denies having a headache or slurred speech at that time. No change in his vision. He denies numbness or weakness of his lower extremities. He reports again that he is feeling improved at this time. No recent foreign travel. No pleuritic chest pain. No fevers, cold symptoms, vomiting or diarrhea. He reports  that he does have an occasional dry cough but was also treated for pneumonia back at the end of February. He does continue to smoke. He denies a history of asthma or emphysema.  Patient is a 56 y.o. male presenting with shortness of breath and altered mental status. The history is provided by the patient.  Shortness of Breath  Associated symptoms include cough and shortness of breath. Pertinent negatives include no fever, no rhinorrhea and no sore throat.  Altered Mental Status Associated symptoms include shortness of breath. Pertinent negatives include no abdominal pain and no headaches.    Past Medical History  Diagnosis Date  . Dyslipidemia   . HTN (hypertension)   . CAD (coronary artery disease)     a. s/p PTCA/stent to prox RCA 2002. b. s/p PTCA prox RCA and DES to prox-mid RCA 2006. c. s/p PTCA/DES to OM4 2007. d. s/p PTCA/cutting balloon to RCA for ISR 10/2006 with EF 55-60% at that time (last cath). e. [name] 02/2011 showing mod scar in the base/mid inferior and inferolateral segments; there may be slight peri-infarct ischemia, EF 41%  . Ischemic cardiomyopathy     EF 40%; improved to normal;  echo 9/11: EF 55-60%, inferior akinesis, moderate to severe LVH, moderate LAE, PASP 33 with EF 41% by nuclear 02/2011  . GERD (gastroesophageal reflux disease)   . Tobacco abuse   . Obesity   . OSA (obstructive sleep apnea)     Does not use CPAP as of 05/2011  . Depression   .  Pneumonia   . CHF (congestive heart failure)     Past Surgical History  Procedure Date  . Coronary stent placement     Family History  Problem Relation Age of Onset  . Coronary artery disease    . Depression Mother   . Cancer Mother   . Hypertension Mother   . Other Father     motor vehicle accident    History  Substance Use Topics  . Smoking status: Current Everyday Smoker  . Smokeless tobacco: Not on file  . Alcohol Use: No      Review of Systems  Constitutional: Negative.  Negative for fever  and chills.  HENT: Negative for congestion, sore throat, rhinorrhea, neck stiffness, sinus pressure and ear discharge.   Eyes: Negative for visual disturbance.  Respiratory: Positive for cough, chest tightness and shortness of breath.   Cardiovascular: Negative for palpitations and leg swelling.  Gastrointestinal: Negative for nausea, vomiting, abdominal pain and diarrhea.  Musculoskeletal: Negative for back pain.  Skin: Negative for rash and wound.  Neurological: Negative for dizziness, speech difficulty, weakness, numbness and headaches.  Psychiatric/Behavioral: Positive for confusion and altered mental status.  All other systems reviewed and are negative.    Allergies  Penicillins  Home Medications   Current Outpatient Rx  Name Route Sig Dispense Refill  . AMLODIPINE BESYLATE 10 MG PO TABS Oral Take 10 mg by mouth every morning.      . ASPIRIN EC 81 MG PO TBEC Oral Take 81 mg by mouth every morning.     [name] BENAZEPRIL HCL 40 MG PO TABS Oral Take 40 mg by mouth every morning.      [name] CARVEDILOL 25 MG PO TABS Oral Take 25 mg by mouth 2 (two) times daily with a meal.      . CITALOPRAM HYDROBROMIDE 40 MG PO TABS Oral Take 20 mg by mouth at bedtime.     [name] CLONIDINE HCL 0.2 MG PO TABS Oral Take 0.2 mg by mouth 2 (two) times daily.      [name] CLOPIDOGREL BISULFATE 75 MG PO TABS Oral Take 75 mg by mouth every morning.     [name] FAMOTIDINE 40 MG PO TABS Oral Take 40 mg by mouth every 12 (twelve) hours.      . FUROSEMIDE 40 MG PO TABS Oral Take 40 mg by mouth every morning.     [name] LOSARTAN POTASSIUM 100 MG PO TABS Oral Take 100 mg by mouth every morning.     [name] NITROGLYCERIN 0.4 MG SL SUBL Sublingual Place 0.4 mg under the tongue every 5 (five) minutes as needed. For chest pain     . POTASSIUM CHLORIDE CRYS ER 20 MEQ PO TBCR Oral Take 20 mEq by mouth every morning.     [name] PRAVASTATIN SODIUM 80 MG PO TABS  TAKE 1 TABLET BY MOUTH EVERY NIGHT AT BEDTIME 30 tablet 7  . TAMSULOSIN HCL 0.4 MG PO CAPS Oral  Take 0.4 mg by mouth daily.        BP 161/98  Pulse 58  Temp(Src) 98.1 F (36.7 C) (Oral)  Resp 18  SpO2 94%  Physical Exam  Nursing note and vitals reviewed. Constitutional: He is oriented to person, place, and time. He appears well-developed and well-nourished. No distress.  HENT:  Head: Normocephalic and atraumatic.  Eyes: Pupils are equal, round, and reactive to light. No scleral icterus.  Neck: Normal range of motion. Neck supple.  Cardiovascular: Normal rate and regular rhythm.   No murmur  heard. Pulmonary/Chest: Effort normal and breath sounds normal. No respiratory distress. He has no wheezes. He has no rales.  Abdominal: Soft. He exhibits no distension. There is no tenderness. There is no rebound and no guarding.  Musculoskeletal: He exhibits no edema and no tenderness.  Neurological: He is alert and oriented to person, place, and time. No cranial nerve deficit. Coordination normal.  Skin: Skin is warm and dry. No rash noted. He is not diaphoretic.  Psychiatric: He has a normal mood and affect.    ED Course  Procedures (including critical care time)  Labs Reviewed  CBC - Abnormal; Notable for the following:    RDW 16.1 (*)    All other components within normal limits  DIFFERENTIAL - Abnormal; Notable for the following:    Neutrophils Relative 24 (*)    Neutro Abs 1.5 (*)    Lymphocytes Relative 64 (*)    Lymphs Abs 4.2 (*)    All other components within normal limits  BLOOD GAS, VENOUS - Abnormal; Notable for the following:    pH, Ven 7.402 (*)    pCO2, Ven 44.0 (*)    pO2, Ven 150.0 (*)    Bicarbonate 26.8 (*)    Acid-Base Excess 2.1 (*)    All other components within normal limits  BASIC METABOLIC PANEL  POCT I-STAT TROPONIN I  BLOOD GAS, VENOUS  PROTIME-INR  APTT  LIPID PANEL  HEMOGLOBIN A1C   Dg Chest 2 View  09/15/2011  *RADIOLOGY REPORT*  Clinical Data: Pain, shortness of breath  CHEST - 2 VIEW  Comparison: 08/03/2011  Findings: Cardiomediastinal  silhouette is stable.  Probable chronic mild interstitial prominence without convincing pulmonary edema. Stable streaky bilateral basilar atelectasis or scarring.  No segmental infiltrate.  IMPRESSION: Probable chronic mild interstitial prominence without convincing pulmonary edema.  Stable streaky bilateral basilar atelectasis or scarring.  No segmental infiltrate.  Original Report Authenticated By: Natasha Mead, M.D.   Ct Head Wo Contrast  09/15/2011  *RADIOLOGY REPORT*  Clinical Data: Altered mental status, confusion  CT HEAD WITHOUT CONTRAST  Technique:  Contiguous axial images were obtained from the base of the skull through the vertex without contrast.  Comparison: 02/14/2008  Findings: No skull fracture is noted.  Paranasal sinuses and mastoid air cells are unremarkable.  No intracranial hemorrhage, mass effect or midline shift.  The gray and white matter differentiation is preserved.  No intra-axial or extra-axial fluid collection.  No acute infarction.  No mass lesion is noted on this unenhanced scan.  IMPRESSION: No acute intracranial abnormality.  Original Report Authenticated By: Natasha Mead, M.D.     1. Confusion   2. Dyspnea     RA sats 94% and normal.    EKG performed at time 12:37, shows sinus bradycardia at a rate of 59 with associated sinus arrhythmia. Axis is normal. Left ventricular hypertrophy with associated repoll abnormality is noted. Appearance is similar to ECG from 08/02/2011.   2:42 PM Pt continues to feel at baseline.  The only sig concern that I have is possibly TIA given confusion, difficulty concentrating and left UE numbness and heaviness that resolved on its own.  Pt's ABG, CXR are ok.  Troponin is neg. ECG unchanged, no arrythmias.  Pt willing to stay for TIA eval.  Will try to transfer to [facility] for TIA protcol.   ABCD2 score of 4 due to hypertension, possibly unilateral weakness and duration >10 min.    2:51 PM Discussed with PAC Narvaez and Dr. Brooke Dare.  MDM  Pt  with essentially return to baseline, is mildly HTN, which is likely due to chronic history.  No current CP, SOB.  Not sweaty.  Non focal neuro exam now.  A&O times 4, memory is intact.  Abd is soft, lungs clear.  Will check head CT and labs, EKG, troponin and CXR.  Main conerns that I might have is CO2 retention or transient hypoxemia of unclear cause.  However no pleurisy, no sig lower leg edema, no risks for PE.  Atypical for ACS.  Could be TIA although pt had bilateral hand numbness earlier.          Gavin Pound. Tearra Ouk, MD 09/15/11 1452

## 2011-09-15 NOTE — Discharge Instructions (Signed)
FOLLOW UP WITH DR. Roseanne Reno OF NEUROLOGY FOR FURTHER EVALUATION OF SYMPTOMS OF TIA. RETURN HERE WITH ANY RECURRENT CONFUSION, NUMBNESS ON ONE SIDE OR ANOTHER, OR NEW CONCERN.  Transient Ischemic Attack A transient ischemic attack (TIA) is a "warning stroke" that causes stroke-like symptoms. Unlike a stroke, a TIA does not cause permanent damage to the brain. The symptoms of a TIA can happen very fast and do not last long. It is important to know the symptoms of a TIA and what to do. This can help prevent a major stroke or death. CAUSES   A TIA is caused by a temporary blockage in an artery in the brain or neck (carotid artery). The blockage does not allow the brain to get the blood supply it needs and can cause different symptoms. The blockage can be caused by either:   A blood clot.   Fatty buildup (plaque) in a neck or brain artery.  SYMPTOMS  TIA symptoms are the same as a stroke but are temporary. Symptoms can include sudden:  Numbness or weakness on one side of the body. Especially to the:   Face.   Arm.   Leg.   Trouble speaking, thinking, or confusion.   Change in vision, such as trouble seeing in one or both eyes.   Dizziness, loss of balance, or difficulty walking.   Severe headache.  ANY OF THESE SYMPTOMS MAY REPRESENT A SERIOUS PROBLEM THAT IS AN EMERGENCY. Do not wait to see if the symptoms will go away. Get medical help at once. Call your local emergency services (911 in [place] IMMEDIATELY. DO NOT drive yourself to the hospital. RISK FACTORS Risk factors can increase the risk of developing a TIA. These can include.   High blood pressure (hypertension).   High cholesterol (hyperlipidemia).   Heart disease (atherosclerosis).   Smoking.   Diabetes.   Abnormal heart rhythm (atrial fibrillation).   Family history of a stroke or heart attack.   Use of oral contraceptives (especially when combined with smoking).  DIAGNOSIS   A TIA can be diagnosed based on your:     Symptoms.   History.   Risk factors.   Tests that can help diagnose the symptoms of a TIA include:   CT or MRI scan. These tests can provide detailed images of the brain.   Carotid ultrasound. This test looks to see if there are blockages in the carotid arteries of your neck.   Arteriography. A thin, small flexible tube (catheter) is inserted through a small cut (incision) in your groin. The catheter is threaded to your carotid or vertebral artery. A dye is then injected into the catheter. The dye highlights the arteries in your brain and allows your caregiver to look for narrowing or blockages that can cause a TIA.  TREATMENT  Based on the cause of a TIA, treatment options can vary. Treatment is important to help prevent a stroke. Treatment options can include:  Medication. Such as:   Clot-busting medicine.   Anti-platelet medicine.   Blood pressure medicine.   Blood thinner medicine.   Surgery:   Carotid endarterectomy. The carotid arteries are the arteries that supply the head and neck with oxygenated blood. This surgery can help remove fatty deposits (plaque) in the carotid arteries.   Angioplasty and stenting. This surgery uses a balloon to dilate a blocked artery in the brain. A stent is a small, metal mesh tube that can help keep an artery open  HOME CARE INSTRUCTIONS   It is important  to take all medicine as told by your caregiver. If the medicine has side effects that affect you negatively, tell your caregiver right away. Do not stop taking medicine unless told by your caregiver. Some medicines may need to be changed to better treat your condition.   Do not smoke. Talk to your caregiver on how to quit smoking.   Eat a diet high in fruits, vegetables and lean meat. Avoid a high fat, high salt diet. A dietician can you help you make healthy food choices.   Maintain a healthy weight. Develop an exercise plan approved by your caregiver.  SEEK IMMEDIATE MEDICAL CARE  IF:   You develop weakness or numbness on one side of your body.   You have problems thinking, speaking, or feel confused.   You have vision changes.   You feel dizzy, have trouble walking, or lose your balance.   You develop a severe headache.  MAKE SURE YOU:   Understand these instructions.   Will watch your condition.   Will get help right away if you are not doing well or get worse.  Document Released: 03/07/2005 Document Revised: 05/17/2011 Document Reviewed: 07/21/2009 [facility] Patient Information 2012 [place], [place].

## 2011-09-15 NOTE — ED Notes (Signed)
Heart Healthy Diet ordered  

## 2011-09-15 NOTE — ED Notes (Signed)
ZOX:[id number]<AV> Expected date:09/15/11<BR> Expected time:<BR> Means of arrival:<BR> Comments:<BR> EMS 12 GC - sob

## 2011-09-17 LAB — BLOOD GAS, VENOUS
Acid-Base Excess: 2.1 mmol/L — ABNORMAL HIGH (ref 0.0–2.0)
Bicarbonate: 26.8 mEq/L — ABNORMAL HIGH (ref 20.0–24.0)
FIO2: 0.32 %
O2 Saturation: 99.1 %
Patient temperature: 98.6
TCO2: 23.2 mmol/L (ref 0–100)
pCO2, Ven: 44 mmHg — ABNORMAL LOW (ref 45.0–50.0)
pH, Ven: 7.402 — ABNORMAL HIGH (ref 7.250–7.300)
pO2, Ven: 150 mmHg — ABNORMAL HIGH (ref 30.0–45.0)

## 2011-10-16 ENCOUNTER — Ambulatory Visit (INDEPENDENT_AMBULATORY_CARE_PROVIDER_SITE_OTHER): Payer: Medicaid Other | Admitting: Cardiovascular Disease

## 2011-10-16 ENCOUNTER — Encounter: Payer: Self-pay | Admitting: Cardiovascular Disease

## 2011-10-16 VITALS — BP 180/110 | HR 76 | Ht 72.0 in | Wt 344.8 lb

## 2011-10-16 DIAGNOSIS — E785 Hyperlipidemia, unspecified: Secondary | ICD-10-CM

## 2011-10-16 DIAGNOSIS — I10 Essential (primary) hypertension: Secondary | ICD-10-CM

## 2011-10-16 DIAGNOSIS — I2510 Atherosclerotic heart disease of native coronary artery without angina pectoris: Secondary | ICD-10-CM

## 2011-10-16 MED ORDER — CLONIDINE HCL 0.2 MG PO TABS: 0.2000 mg | ORAL_TABLET | Freq: Two times a day (BID) | ORAL | Status: DC

## 2011-10-16 MED ORDER — PRAVASTATIN SODIUM 80 MG PO TABS: 80.0000 mg | ORAL_TABLET | Freq: Every day | ORAL | Status: DC

## 2011-10-16 NOTE — Patient Instructions (Signed)
Your physician recommends that you schedule a follow-up appointment in: 4 weeks with Tereso Newcomer, PA  Your physician recommends that you return for fasting lab work in: 12 weeks --Lipid and Liver profile.

## 2011-10-18 ENCOUNTER — Encounter: Payer: Self-pay | Admitting: Cardiovascular Disease

## 2011-10-18 NOTE — Progress Notes (Signed)
HPI:  56 year old gentleman with coronary artery disease status post PCI to the right coronary artery with most recent intervention in 2008. He has an ischemic cardiomyopathy with a left ventricular ejection fraction initially of 40%, improved to 55-60% after revascularization. He complains of exertional dyspnea as well as exertional angina. His chest pressure is fairly mild. The patient continues to be very noncompliant with his medications. He has run out of clonidine and he has forgotten to take his other medicines today. He denies edema, orthopnea, PND, lightheadedness, or syncope. He continues to smoke cigarettes. His last nuclear stress scan in 2012 showed moderate scar in the basal and midinferior and inferolateral wall.  Outpatient Encounter Prescriptions as of 10/16/2011  Medication Sig Dispense Refill  . amLODipine (NORVASC) 10 MG tablet Take 10 mg by mouth every morning.        [name] aspirin EC 81 MG tablet Take 81 mg by mouth every morning.       . benazepril (LOTENSIN) 40 MG tablet Take 40 mg by mouth every morning.        . carvedilol (COREG) 25 MG tablet Take 25 mg by mouth 2 (two) times daily with a meal.        . citalopram (CELEXA) 40 MG tablet Take 40 mg by mouth at bedtime.       . cloNIDine (CATAPRES) 0.2 MG tablet Take 1 tablet (0.2 mg total) by mouth 2 (two) times daily.  60 tablet  6  . clopidogrel (PLAVIX) 75 MG tablet Take 75 mg by mouth every morning.       . famotidine (PEPCID) 40 MG tablet Take 40 mg by mouth every 12 (twelve) hours.        . furosemide (LASIX) 40 MG tablet Take 40 mg by mouth every morning.       [name] losartan (COZAAR) 100 MG tablet Take 100 mg by mouth every morning.       . nitroGLYCERIN (NITROSTAT) 0.4 MG SL tablet Place 0.4 mg under the tongue every 5 (five) minutes as needed. For chest pain       . potassium chloride SA (K-DUR,KLOR-CON) 20 MEQ tablet Take 20 mEq by mouth every morning.       . pravastatin (PRAVACHOL) 80 MG tablet Take 1 tablet (80 mg  total) by mouth daily.  30 tablet  6  . DISCONTD: cloNIDine (CATAPRES) 0.2 MG tablet Take 0.2 mg by mouth 2 (two) times daily.        [name] DISCONTD: pravastatin (PRAVACHOL) 80 MG tablet TAKE 1 TABLET BY MOUTH EVERY NIGHT AT BEDTIME  30 tablet  7  . Tamsulosin HCl (FLOMAX) 0.4 MG CAPS Take 0.4 mg by mouth daily.          Allergies  Allergen Reactions  . Penicillins Other (See Comments)    Unknown childhood reaction    Past Medical History  Diagnosis Date  . Dyslipidemia   . HTN (hypertension)   . CAD (coronary artery disease)     a. s/p PTCA/stent to prox RCA 2002. b. s/p PTCA prox RCA and DES to prox-mid RCA 2006. c. s/p PTCA/DES to OM4 2007. d. s/p PTCA/cutting balloon to RCA for ISR 10/2006 with EF 55-60% at that time (last cath). e. [name] 02/2011 showing mod scar in the base/mid inferior and inferolateral segments; there may be slight peri-infarct ischemia, EF 41%  . Ischemic cardiomyopathy     EF 40%; improved to normal;  echo 9/11: EF 55-60%, inferior akinesis, moderate to  severe LVH, moderate LAE, PASP 33 with EF 41% by nuclear 02/2011  . GERD (gastroesophageal reflux disease)   . Tobacco abuse   . Obesity   . OSA (obstructive sleep apnea)     Does not use CPAP as of 05/2011  . Depression   . Pneumonia   . CHF (congestive heart failure)     ROS: Negative except as per HPI  BP 180/110  Pulse 76  Ht 6' (1.829 m)  Wt 156.4 kg (344 lb 12.8 oz)  BMI 46.76 kg/m2  PHYSICAL EXAM: Pt is alert and oriented, obese male, pleasant, in NAD HEENT: normal Neck: JVP - normal, carotids 2+= without bruits Lungs: CTA bilaterally CV: RRR without murmur. Positive for S4. Abd: soft, NT, Positive BS, no hepatomegaly Ext: no C/C/E, distal pulses intact and equal Skin: warm/dry no rash  ASSESSMENT AND PLAN: 1. Malignant hypertension, uncontrolled. The main issue relates to medical noncompliance. We spent most of our time in the office discussing his need to take responsibility for his health  care. He seems to have reasonable insight and is not clear to me why he doesn't take his medication as prescribed. He tells me cost is not an issue. He understands that inconsistent use of clonidine can cause rebound hypertension. He was written a prescription for clonidine. He was advised to take the rest of his medicines as prescribed. He should followup with Tereso Newcomer in 4 weeks for a blood pressure recheck and reassessment.  2. Coronary artery disease status post PCI. The patient has class 2-3 angina. I suspect this will be much better controlled when his blood pressure improves. He will continue on his current medical regimen. His Myoview stress test last year showed only minimal peri-infarct ischemia  3. Diastolic heart failure. Again, this will be better controlled with improved blood pressure. He does not have physical exam evidence of volume overload at the present time.  [name] [name] 10/18/2011 11:45 PM

## 2011-10-29 ENCOUNTER — Other Ambulatory Visit: Payer: Self-pay | Admitting: Cardiovascular Disease

## 2011-11-14 ENCOUNTER — Ambulatory Visit: Payer: Medicare HMO | Admitting: Physician Assistant

## 2011-11-14 ENCOUNTER — Ambulatory Visit (INDEPENDENT_AMBULATORY_CARE_PROVIDER_SITE_OTHER): Payer: Medicare HMO | Admitting: Physician Assistant

## 2011-11-14 ENCOUNTER — Encounter: Payer: Self-pay | Admitting: Physician Assistant

## 2011-11-14 VITALS — BP 130/74 | HR 55 | Ht 72.0 in | Wt 341.0 lb

## 2011-11-14 DIAGNOSIS — I2510 Atherosclerotic heart disease of native coronary artery without angina pectoris: Secondary | ICD-10-CM

## 2011-11-14 DIAGNOSIS — E785 Hyperlipidemia, unspecified: Secondary | ICD-10-CM

## 2011-11-14 DIAGNOSIS — R5383 Other fatigue: Secondary | ICD-10-CM

## 2011-11-14 DIAGNOSIS — G4733 Obstructive sleep apnea (adult) (pediatric): Secondary | ICD-10-CM

## 2011-11-14 DIAGNOSIS — I10 Essential (primary) hypertension: Secondary | ICD-10-CM

## 2011-11-14 LAB — BASIC METABOLIC PANEL
Calcium: 9.1 mg/dL (ref 8.4–10.5)
GFR: 94.4 mL/min (ref >60.00)
Glucose, Bld: 96 mg/dL (ref 70–99)
Sodium: 145 mEq/L (ref 135–145)

## 2011-11-14 LAB — HEPATIC FUNCTION PANEL
ALT: 20 U/L (ref 0–53)
AST: 16 U/L (ref 0–37)
Albumin: 3.9 g/dL (ref 3.5–5.2)
Total Protein: 6.9 g/dL (ref 6.0–8.3)

## 2011-11-14 LAB — CBC WITH DIFFERENTIAL/PLATELET
Basophils Absolute: 0 10*3/uL (ref 0.0–0.1)
Hemoglobin: 15.2 g/dL (ref 13.0–17.0)
Lymphocytes Relative: 56.3 % — ABNORMAL HIGH (ref 12.0–46.0)
Monocytes Relative: 10.3 % (ref 3.0–12.0)
Neutro Abs: 2.2 10*3/uL (ref 1.4–7.7)
Platelets: 150 10*3/uL (ref 150.0–400.0)
RDW: 16.4 % — ABNORMAL HIGH (ref 11.5–14.6)

## 2011-11-14 LAB — LIPID PANEL
HDL: 43.3 mg/dL (ref >39.00)
Triglycerides: 80 mg/dL (ref 0.0–149.0)

## 2011-11-14 LAB — TSH: TSH: 1.26 u[IU]/mL (ref 0.35–5.50)

## 2011-11-14 NOTE — Patient Instructions (Addendum)
Your physician recommends that you continue on your current medications as directed. Please refer to the Current Medication list given to you today.  Your physician recommends that you schedule a follow-up appointment in: 3 months with Bing Neighbors. PA  Will obtain labs today and call you with the results   Will arrange for an appointment with [facility] to discuss sleeping issues with CPAP machine

## 2011-11-14 NOTE — Progress Notes (Signed)
[address] [place], [place]  [address] Phone: [phone] Fax:  [phone]  Date:  11/14/2011   Name:  [name] [name]   DOB:  August 16, 1955   MRN:  [id number]  PCP:  No primary provider on file.  Primary Cardiologist:  Dr. Tonny Bollman  Primary Electrophysiologist:  None    History of Present Illness: [name] [name] is a 56 y.o. male who returns for follow up.  He is a prior patient of mine from [facility]. He has a history of CAD, status post PCI to the RCA and PL1, cutting balloon angioplasty secondary to in-stent restenosis of the RCA in 5/08, ischemic cardiomyopathy with previous EF 40%, improved to 55-60%, hypertension and hyperlipidemia. Last LHC 5/08: mLAD 40-50%, PL stent patent, dCFX 50%, mRCA 80% in-stent restenosis treated with cutting balloon angioplasty, EF 55-60%. Last echo 4/13: mild LVH, EF 50-55%, basal inf AK, mild LAE. Last Myoview 9/12: mod scar in base/mid inf and IL segments, slight peri-infarct ischemia - low risk (med Rx continued).   Last seen by Dr. [name] 10/18/11.  He noted exertional dyspnea as well as exertional angina.  His blood pressure was out of control at 180/110.  He was noted to be noncompliant with his medications and missed taking his clonidine.  His clonidine was restarted he was asked to followup here today.  His chest discomfort was felt to be mainly driven by his out of control blood pressure.  It was felt that this would likely improve with improved blood pressure control.  Compliant with meds.  Feels better.  However, complains of significant fatigue.  Does not wear CPAP through the night.  He cannot tolerate it.  Otherwise, denies chest pain, dyspnea, syncope, orthopnea, PND, edema.  No syncope.  Tries to watch his salt.    Wt Readings from Last 3 Encounters:  10/16/11 344 lb 12.8 oz (156.4 kg)  09/15/11 310 lb (140.615 kg)  05/28/11 330 lb (149.687 kg)     Potassium  Date/Time Value Range Status  09/15/2011  1:05 PM  3.9  3.5-5.1 (mEq/L) Final     SLIGHT HEMOLYSIS     Creatinine, Ser  Date/Time Value Range Status  09/15/2011  1:05 PM 0.95  0.50-1.35 (mg/dL) Final     ALT  Date/Time Value Range Status  05/28/2011 10:13 AM 18  0-53 (U/L) Final     TSH  Date/Time Value Range Status  07/21/2009  9:49 PM 1.668  0.350-4.500 (microintl units/mL) Final     See lab report for associated comment(s)     Hemoglobin  Date/Time Value Range Status  09/15/2011  1:05 PM 15.2  13.0-17.0 (g/dL) Final    Past Medical History  Diagnosis Date  . Dyslipidemia   . HTN (hypertension)   . CAD (coronary artery disease)     a. s/p PTCA/stent to prox RCA 2002. b. s/p PTCA prox RCA and DES to prox-mid RCA 2006. c. s/p PTCA/DES to OM4 2007. d. s/p PTCA/cutting balloon to RCA for ISR 10/2006 with EF 55-60% at that time (last cath). e. [name] 02/2011 showing mod scar in the base/mid inferior and inferolateral segments;  Last Myoview 9/12: mod scar in base/mid inf and IL, slight peri-infarct isch - low risk (med Rx)  . Ischemic cardiomyopathy     EF 40%; improved to normal;  echo 9/11: EF 55-60%, inferior akinesis, moderate to severe LVH, moderate LAE, PASP 33 with EF 41% by nuclear 02/2011;  Last echo 4/13: mild LVH, EF 50-55%, basal inf  AK, mild LAE.  [name] GERD (gastroesophageal reflux disease)   . Tobacco abuse   . Obesity   . OSA (obstructive sleep apnea)     Does not use CPAP as of 05/2011  . Depression   . Pneumonia   . CHF (congestive heart failure)     Current Outpatient Prescriptions  Medication Sig Dispense Refill  . amLODipine (NORVASC) 10 MG tablet Take 10 mg by mouth every morning.        [name] aspirin EC 81 MG tablet Take 81 mg by mouth every morning.       . benazepril (LOTENSIN) 40 MG tablet Take 40 mg by mouth every morning.        . carvedilol (COREG) 25 MG tablet Take 25 mg by mouth 2 (two) times daily with a meal.        . citalopram (CELEXA) 40 MG tablet Take 40 mg by mouth at bedtime.       . cloNIDine  (CATAPRES) 0.2 MG tablet Take 1 tablet (0.2 mg total) by mouth 2 (two) times daily.  60 tablet  6  . clopidogrel (PLAVIX) 75 MG tablet Take 75 mg by mouth every morning.       . famotidine (PEPCID) 40 MG tablet Take 40 mg by mouth every 12 (twelve) hours.        . furosemide (LASIX) 40 MG tablet Take 40 mg by mouth every morning.       [name] losartan (COZAAR) 100 MG tablet Take 100 mg by mouth every morning.       . nitroGLYCERIN (NITROSTAT) 0.4 MG SL tablet Place 0.4 mg under the tongue every 5 (five) minutes as needed. For chest pain       . potassium chloride SA (K-DUR,KLOR-CON) 20 MEQ tablet TAKE 1 TABLET BY MOUTH EVERY DAY  90 tablet  0  . pravastatin (PRAVACHOL) 80 MG tablet Take 1 tablet (80 mg total) by mouth daily.  30 tablet  6  . Tamsulosin HCl (FLOMAX) 0.4 MG CAPS Take 0.4 mg by mouth daily.        [name] DISCONTD: potassium chloride SA (K-DUR,KLOR-CON) 20 MEQ tablet Take 20 mEq by mouth every morning.         Allergies: Allergies  Allergen Reactions  . Penicillins Other (See Comments)    Unknown childhood reaction    History  Substance Use Topics  . Smoking status: Current Everyday Smoker  . Smokeless tobacco: Not on file  . Alcohol Use: No     ROS:  Please see the history of present illness.   No bleeding.  Notes some occasional left sided neck pain.   All other systems reviewed and negative.   PHYSICAL EXAM: VS:  BP 130/74  Pulse 55  Ht 6' (1.829 m)  Wt 341 lb (154.677 kg)  BMI 46.25 kg/m2 Repeat BP by me: 124/80 on the left  Well nourished, well developed, in no acute distress HEENT: normal Neck: no JVD Vascular: no carotid bruits Cardiac:  normal S1, S2; RRR; no murmur Lungs:  clear to auscultation bilaterally, no wheezing, rhonchi or rales Abd: soft, nontender, no hepatomegaly Ext: no edema Skin: warm and dry Neuro:  CNs 2-12 intact, no focal abnormalities noted  EKG:  Sinus bradycardia, vent rate 55, normal axis, T wave inversions in leads 2, 3, aVF and V5-V6,  no significant change compared to prior tracing, LVH   ASSESSMENT AND PLAN:  1.  Hypertension Controlled.  Continue current therapy.   2.  Coronary Artery Disease Stable.  No further chest pain. Continue ASA and Plavix. Continue statin. Follow up with me in 3 mos.  3.  Hyperlipidemia Lab Results  Component Value Date   CHOL 224* 09/15/2011   HDL 45 09/15/2011   LDLCALC 409* 09/15/2011   TRIG 59 09/15/2011   CHOLHDL 5.0 09/15/2011   Repeat Lipids planned next month as above labs done while patient off statin.  4.  Fatigue Suspect related to intolerance to CPAP and lack of appropriate sleep. Check CBC, TSH, BMET today.  5.  Sleep Apnea As above. Refer to sleep medicine to see if changes can be made to assist in his compliance.     Signed, Tereso Newcomer, PA-C  2:03 PM 11/14/2011

## 2011-12-03 ENCOUNTER — Telehealth: Payer: Self-pay | Admitting: Cardiovascular Disease

## 2011-12-03 NOTE — Telephone Encounter (Signed)
New Problem:    Patient called in responding to a letter that he received in the mail about his latest blood work.  Please call back.

## 2011-12-03 NOTE — Telephone Encounter (Signed)
lmom ptcb to go over lab results and med changes. called the [phone] # pt gave this morning 

## 2011-12-03 NOTE — Telephone Encounter (Signed)
lmom ptcb to go over lab results and med changes. called the [phone] # pt gave this morning

## 2011-12-04 ENCOUNTER — Telehealth: Payer: Self-pay | Admitting: *Deleted

## 2011-12-04 DIAGNOSIS — I10 Essential (primary) hypertension: Secondary | ICD-10-CM

## 2011-12-04 MED ORDER — POTASSIUM CHLORIDE CRYS ER 20 MEQ PO TBCR: 40.0000 meq | EXTENDED_RELEASE_TABLET | Freq: Every day | ORAL | Status: DC

## 2011-12-04 NOTE — Telephone Encounter (Signed)
s/w pt today and he is aware of all lab results and and that he is to increase potassium to 40 meq dialy with repeat bmet 12/14/11. I have changed his cell # to [phone] per pt.

## 2011-12-04 NOTE — Telephone Encounter (Signed)
s/w pt today and he is aware of all lab results and and that he is to increase potassium to 40 meq dialy with repeat bmet 12/14/11. I have changed his cell # to [phone] per pt. 

## 2011-12-05 ENCOUNTER — Institutional Professional Consult (permissible substitution): Payer: Medicare HMO | Admitting: Pulmonary Disease

## 2011-12-14 ENCOUNTER — Ambulatory Visit (INDEPENDENT_AMBULATORY_CARE_PROVIDER_SITE_OTHER): Payer: Medicare HMO | Admitting: *Deleted

## 2011-12-14 DIAGNOSIS — I10 Essential (primary) hypertension: Secondary | ICD-10-CM

## 2011-12-14 LAB — BASIC METABOLIC PANEL
BUN: 11 mg/dL (ref 6–23)
Chloride: 110 mEq/L (ref 96–112)
Potassium: 3.8 mEq/L (ref 3.5–5.1)

## 2012-01-07 ENCOUNTER — Other Ambulatory Visit: Payer: Medicare HMO

## 2012-01-14 ENCOUNTER — Ambulatory Visit (INDEPENDENT_AMBULATORY_CARE_PROVIDER_SITE_OTHER): Payer: Medicare HMO | Admitting: *Deleted

## 2012-01-14 DIAGNOSIS — I2510 Atherosclerotic heart disease of native coronary artery without angina pectoris: Secondary | ICD-10-CM

## 2012-01-14 DIAGNOSIS — I10 Essential (primary) hypertension: Secondary | ICD-10-CM

## 2012-01-14 DIAGNOSIS — E785 Hyperlipidemia, unspecified: Secondary | ICD-10-CM

## 2012-01-14 LAB — LIPID PANEL
Cholesterol: 138 mg/dL (ref 0–200)
HDL: 47.5 mg/dL (ref >39.00)
LDL Cholesterol: 82 mg/dL (ref 0–99)
VLDL: 8.2 mg/dL (ref 0.0–40.0)

## 2012-01-14 LAB — HEPATIC FUNCTION PANEL
Albumin: 4 g/dL (ref 3.5–5.2)
Bilirubin, Direct: 0.1 mg/dL (ref 0.0–0.3)
Total Protein: 7.1 g/dL (ref 6.0–8.3)

## 2012-01-16 ENCOUNTER — Institutional Professional Consult (permissible substitution): Payer: Medicare HMO | Admitting: Pulmonary Disease

## 2012-01-31 ENCOUNTER — Encounter (HOSPITAL_COMMUNITY): Payer: Self-pay | Admitting: Emergency Medicine

## 2012-01-31 ENCOUNTER — Emergency Department (HOSPITAL_COMMUNITY)
Admission: EM | Admit: 2012-01-31 | Discharge: 2012-01-31 | Disposition: A | Discharge status: Home / Self Care | Payer: Medicare HMO | Attending: Emergency Medicine | Admitting: Emergency Medicine

## 2012-01-31 DIAGNOSIS — I2589 Other forms of chronic ischemic heart disease: Secondary | ICD-10-CM | POA: Insufficient documentation

## 2012-01-31 DIAGNOSIS — G4733 Obstructive sleep apnea (adult) (pediatric): Secondary | ICD-10-CM | POA: Insufficient documentation

## 2012-01-31 DIAGNOSIS — I10 Essential (primary) hypertension: Secondary | ICD-10-CM | POA: Insufficient documentation

## 2012-01-31 DIAGNOSIS — I2510 Atherosclerotic heart disease of native coronary artery without angina pectoris: Secondary | ICD-10-CM | POA: Insufficient documentation

## 2012-01-31 DIAGNOSIS — E669 Obesity, unspecified: Secondary | ICD-10-CM | POA: Insufficient documentation

## 2012-01-31 DIAGNOSIS — K219 Gastro-esophageal reflux disease without esophagitis: Secondary | ICD-10-CM | POA: Insufficient documentation

## 2012-01-31 DIAGNOSIS — F17200 Nicotine dependence, unspecified, uncomplicated: Secondary | ICD-10-CM | POA: Insufficient documentation

## 2012-01-31 DIAGNOSIS — R21 Rash and other nonspecific skin eruption: Secondary | ICD-10-CM

## 2012-01-31 DIAGNOSIS — E785 Hyperlipidemia, unspecified: Secondary | ICD-10-CM | POA: Insufficient documentation

## 2012-01-31 DIAGNOSIS — Z79899 Other long term (current) drug therapy: Secondary | ICD-10-CM | POA: Insufficient documentation

## 2012-01-31 MED ORDER — HYDROXYZINE HCL 25 MG PO TABS: 25.0000 mg | ORAL_TABLET | Freq: Four times a day (QID) | ORAL | Status: AC

## 2012-01-31 MED ORDER — PREDNISONE 20 MG PO TABS: 40.0000 mg | ORAL_TABLET | Freq: Every day | ORAL | Status: AC

## 2012-01-31 NOTE — ED Notes (Signed)
Pt with has rash on forearms that was worse a few days ago and very itchy. Pt says that rash is better. Pt does not know where the rash has come from.

## 2012-01-31 NOTE — ED Provider Notes (Signed)
History     CSN: [id number]  Arrival date & time 01/31/12  1552   First MD Initiated Contact with Patient 01/31/12 1712      No chief complaint on file.   (Consider location/radiation/quality/duration/timing/severity/associated sxs/prior treatment) HPI   56 year old male in no acute distress complaining of itching rash to bilateral forearms next 4 days. He's been using hydrocortisone ointment and has improved but has not resolved. Patient denies any sick contacts, fever or prior similar episodes.   Past Medical History  Diagnosis Date  . Dyslipidemia   . HTN (hypertension)   . CAD (coronary artery disease)     a. s/p PTCA/stent to prox RCA 2002. b. s/p PTCA prox RCA and DES to prox-mid RCA 2006. c. s/p PTCA/DES to OM4 2007. d. s/p PTCA/cutting balloon to RCA for ISR 10/2006 with EF 55-60% at that time (last cath). e. [name] 02/2011 showing mod scar in the base/mid inferior and inferolateral segments;  Last Myoview 9/12: mod scar in base/mid inf and IL, slight peri-infarct isch - low risk (med Rx)  . Ischemic cardiomyopathy     EF 40%; improved to normal;  echo 9/11: EF 55-60%, inferior akinesis, moderate to severe LVH, moderate LAE, PASP 33 with EF 41% by nuclear 02/2011;  Last echo 4/13: mild LVH, EF 50-55%, basal inf AK, mild LAE.  [name] GERD (gastroesophageal reflux disease)   . Tobacco abuse   . Obesity   . OSA (obstructive sleep apnea)     Does not use CPAP as of 05/2011  . Depression   . Pneumonia   . CHF (congestive heart failure)     Past Surgical History  Procedure Date  . Coronary stent placement     Family History  Problem Relation Age of Onset  . Coronary artery disease    . Depression Mother   . Cancer Mother   . Hypertension Mother   . Other Father     motor vehicle accident    History  Substance Use Topics  . Smoking status: Current Everyday Smoker  . Smokeless tobacco: Not on file  . Alcohol Use: No      Review of Systems  Skin: Positive for  rash.  All other systems reviewed and are negative.    Allergies  Penicillins  Home Medications   Current Outpatient Rx  Name Route Sig Dispense Refill  . AMLODIPINE BESYLATE 10 MG PO TABS Oral Take 10 mg by mouth every morning.     . ASPIRIN EC 81 MG PO TBEC Oral Take 81 mg by mouth every morning.     [name] BENAZEPRIL HCL 40 MG PO TABS Oral Take 40 mg by mouth every morning.     [name] CARVEDILOL 25 MG PO TABS Oral Take 25 mg by mouth 2 (two) times daily with a meal.     . CITALOPRAM HYDROBROMIDE 40 MG PO TABS Oral Take 40 mg by mouth at bedtime.     [name] CLONIDINE HCL 0.2 MG PO TABS Oral Take 0.2 mg by mouth 2 (two) times daily.    [name] CLOPIDOGREL BISULFATE 75 MG PO TABS Oral Take 75 mg by mouth every morning.     [name] FAMOTIDINE 40 MG PO TABS Oral Take 40 mg by mouth every 12 (twelve) hours.     . FUROSEMIDE 40 MG PO TABS Oral Take 40 mg by mouth every morning.     [name] LOSARTAN POTASSIUM 100 MG PO TABS Oral Take 100 mg by mouth every morning.     [name]  NITROGLYCERIN 0.4 MG SL SUBL Sublingual Place 0.4 mg under the tongue every 5 (five) minutes as needed. For chest pain    . POTASSIUM CHLORIDE CRYS ER 20 MEQ PO TBCR Oral Take 40 mEq by mouth daily. Take 2 tablets 40 MEQ total    . PRAVASTATIN SODIUM 80 MG PO TABS Oral Take 80 mg by mouth daily.    [name] HYDROXYZINE HCL 25 MG PO TABS Oral Take 1 tablet (25 mg total) by mouth every 6 (six) hours. 12 tablet 0  . PREDNISONE 20 MG PO TABS Oral Take 2 tablets (40 mg total) by mouth daily. 10 tablet 0    BP 146/79  Pulse 53  Temp 98.4 F (36.9 C) (Oral)  Resp 20  SpO2 97%  Physical Exam  Vitals reviewed. Constitutional: He is oriented to person, place, and time. He appears well-developed and well-nourished. No distress.  HENT:  Head: Normocephalic.  Eyes: Conjunctivae and EOM are normal.  Cardiovascular: Normal rate.   Pulmonary/Chest: Effort normal.  Abdominal: Soft.  Musculoskeletal: Normal range of motion.  Neurological: He is alert and oriented to  person, place, and time.  Skin:       Hives like excoriated rash to bilateral forearms: it spares the palms and also the interdigital spaces.  Psychiatric: He has a normal mood and affect.    ED Course  Procedures (including critical care time)  Labs Reviewed - No data to display No results found.   1. Rash       MDM  Rash appears to be allergic in nature consistent with hives however he cannot identify any new environmental exposure. I doubt this is scabies as the interdigital spaces are spared. I will give him a short course of steroids (he is nondiabetic) and write him for hydroxyzine and encourage him to continue using the hydrocortisone ointment and try to identify any the offending agent.  Pt verbalized understanding and agrees with care plan. Outpatient follow-up and return precautions given.           Wynetta Emery, PA-C 01/31/12 [phone]

## 2012-01-31 NOTE — ED Provider Notes (Signed)
Medical screening examination/treatment/procedure(s) were performed by non-physician practitioner and as supervising physician I was immediately available for consultation/collaboration.    Nelia Shi, MD 01/31/12 [phone]

## 2012-02-01 ENCOUNTER — Other Ambulatory Visit: Payer: Self-pay | Admitting: Cardiovascular Disease

## 2012-02-01 NOTE — Telephone Encounter (Signed)
..   Requested Prescriptions   Pending Prescriptions Disp Refills  . potassium chloride SA (K-DUR,KLOR-CON) 20 MEQ tablet [Pharmacy Med Name: POTASSIUM CL ER TABLETS] 90 tablet 2    Sig: TAKE 1 TABLET BY MOUTH EVERY DAY

## 2012-02-13 ENCOUNTER — Ambulatory Visit (INDEPENDENT_AMBULATORY_CARE_PROVIDER_SITE_OTHER): Payer: Medicare HMO | Admitting: Pulmonary Disease

## 2012-02-13 ENCOUNTER — Encounter: Payer: Self-pay | Admitting: Pulmonary Disease

## 2012-02-13 VITALS — BP 138/72 | HR 58 | Temp 98.1°F | Ht 72.0 in | Wt 341.4 lb

## 2012-02-13 DIAGNOSIS — G4733 Obstructive sleep apnea (adult) (pediatric): Secondary | ICD-10-CM

## 2012-02-13 NOTE — Progress Notes (Signed)
  Subjective:    Patient ID: [name] [name], male    DOB: [birth year], 56 y.o.   MRN: [id number]  HPI The patient is a 56 year old male who I've been asked to see for management of obstructive sleep apnea.  He was diagnosed in 2010 with severe OSA, with an AHI of 38 events per hour.  His pressure was optimized to anywhere between 13 and 17 cm of water, and the patient was started on a pressure that he believes was too high.  Because of this, he wore sporadically, but felt it did help his sleep whenever he wore for longer periods of time.  Currently, the patient has a mask that is 55 years old, and he has not had any new supplies or filters.  When he doesn't wear CPAP, he is noted to have loud snoring as well as frequent awakenings.  He is not rested in the mornings upon arising.  He does note definite sleep pressure with periods of inactivity during the day and evening.  The patient's weight has increased 30 pounds since his prior sleep study, and his Epworth score today is elevated.  Sleep Questionnaire: What time do you typically go to bed?( Between what hours) 10-11pm How long does it take you to fall asleep? 15 min How many times during the night do you wake up? 5 What time do you get out of bed to start your day? 0700 Do you drive or operate heavy machinery in your occupation? No How much has your weight changed (up or down) over the past two years? (In pounds) 30 lb (13.608 kg) Have you ever had a sleep study before? Yes If yes, location of study? [facility] If yes, date of study? Do you currently use CPAP? Yes If so, what pressure? 14? [facility] Do you wear oxygen at any time? No    Review of Systems  Constitutional: Negative for fever and unexpected weight change.  HENT: Negative for ear pain, nosebleeds, congestion, sore throat, rhinorrhea, sneezing, trouble swallowing, dental problem, postnasal drip and sinus pressure.   Eyes: Negative for redness and itching.  Respiratory: Positive for cough,  shortness of breath and wheezing. Negative for chest tightness.   Cardiovascular: Positive for palpitations. Negative for leg swelling.  Gastrointestinal: Negative for nausea and vomiting.  Genitourinary: Negative for dysuria.  Musculoskeletal: Positive for joint swelling.  Skin: Positive for rash.  Neurological: Negative for headaches.  Hematological: Does not bruise/bleed easily.  Psychiatric/Behavioral: Positive for dysphoric mood. The patient is nervous/anxious.        Objective:   Physical Exam Constitutional:  Morbidly obese, no acute distress  HENT:  Nares patent without discharge, septal deviation to the left.  Oropharynx without exudate, palate and uvula are thick and very long.   Eyes:  Perrla, eomi, no scleral icterus  Neck:  No JVD, no TMG  Cardiovascular:  Normal rate, regular rhythm, no rubs or gallops.  No murmurs        Intact distal pulses  Pulmonary :  Normal breath sounds, no stridor or respiratory distress   No rales, rhonchi, or wheezing  Abdominal:  Soft, nondistended, bowel sounds present.  No tenderness noted.   Musculoskeletal:  No lower extremity edema noted.  Lymph Nodes:  No cervical lymphadenopathy noted  Skin:  No cyanosis noted  Neurologic:  Appears mildly sleepy, appropriate, moves all 4 extremities without obvious deficit.         Assessment & Plan:

## 2012-02-13 NOTE — Assessment & Plan Note (Signed)
The patient has a history of severe obstructive sleep apnea, and currently is wearing CPAP off and on because of his pressure being "too high".  He has not had new supplies or mask in almost 2 years, and obviously will need to have these replaced.  I would also like to optimize his pressure again with an automatic device at home for a few weeks.  I have stressed to the patient the importance of treating his sleep apnea, especially with his underlying cardiovascular disease.  I have also encouraged him to work aggressively on weight loss.

## 2012-02-13 NOTE — Patient Instructions (Addendum)
Will get you new supplies and new mask Will use a loaner automatic device to re-optimize your pressure for you.  I will call you with the results once I receive your download.  Work on weight loss followup with me in 6mos, but call if having tolerance issues.

## 2012-02-14 ENCOUNTER — Encounter: Payer: Self-pay | Admitting: Physician Assistant

## 2012-02-14 ENCOUNTER — Ambulatory Visit (INDEPENDENT_AMBULATORY_CARE_PROVIDER_SITE_OTHER): Payer: Medicaid Other | Admitting: Physician Assistant

## 2012-02-14 VITALS — BP 126/80 | HR 58 | Ht 72.0 in | Wt 344.8 lb

## 2012-02-14 DIAGNOSIS — E785 Hyperlipidemia, unspecified: Secondary | ICD-10-CM

## 2012-02-14 DIAGNOSIS — Z720 Tobacco use: Secondary | ICD-10-CM

## 2012-02-14 DIAGNOSIS — I10 Essential (primary) hypertension: Secondary | ICD-10-CM

## 2012-02-14 DIAGNOSIS — I25810 Atherosclerosis of coronary artery bypass graft(s) without angina pectoris: Secondary | ICD-10-CM

## 2012-02-14 DIAGNOSIS — F17200 Nicotine dependence, unspecified, uncomplicated: Secondary | ICD-10-CM

## 2012-02-14 DIAGNOSIS — B86 Scabies: Secondary | ICD-10-CM

## 2012-02-14 MED ORDER — PERMETHRIN 5 % EX CREA: TOPICAL_CREAM | Freq: Once | CUTANEOUS | Status: AC

## 2012-02-14 NOTE — Progress Notes (Signed)
[address] [place], [place]  [address] Phone: [phone] Fax:  [phone]  Date:  02/14/2012   Name:  [name] [name]   DOB:  May 21, 1956   MRN:  [id number]  PCP:  No primary provider on file.  Primary Cardiologist:  Dr. Tonny Bollman  Primary Electrophysiologist:  None    History of Present Illness: [name] [name] is a 56 y.o. male who returns for follow up.  He is a prior patient of mine from [facility]. He has a history of CAD, status post PCI to the RCA and PL1, cutting balloon angioplasty secondary to in-stent restenosis of the RCA in 5/08, ischemic cardiomyopathy with previous EF 40%, improved to 55-60%, hypertension and hyperlipidemia. Last LHC 5/08: mLAD 40-50%, PL stent patent, dCFX 50%, mRCA 80% in-stent restenosis treated with cutting balloon angioplasty, EF 55-60%. Last echo 4/13: mild LVH, EF 50-55%, basal inf AK, mild LAE. Last Myoview 9/12: mod scar in base/mid inf and IL segments, slight peri-infarct ischemia - low risk (med Rx continued).   Overall doing well.  Taking all of his medications.  Has occasional chest pain.  Has had this for a long time without changes.  No significant dyspnea.  No orthopnea, PND, edema.  No syncope.  Saw Dr. Shelle Iron for his CPAP.    Wt Readings from Last 3 Encounters:  02/14/12 344 lb 12.8 oz (156.4 kg)  02/13/12 341 lb 6.4 oz (154.858 kg)  11/14/11 341 lb (154.677 kg)     Past Medical History  Diagnosis Date  . Dyslipidemia   . HTN (hypertension)   . CAD (coronary artery disease)     a. s/p PTCA/stent to prox RCA 2002. b. s/p PTCA prox RCA and DES to prox-mid RCA 2006. c. s/p PTCA/DES to OM4 2007. d. s/p PTCA/cutting balloon to RCA for ISR 10/2006 with EF 55-60% at that time (last cath). e. [name] 02/2011 showing mod scar in the base/mid inferior and inferolateral segments;  Last Myoview 9/12: mod scar in base/mid inf and IL, slight peri-infarct isch - low risk (med Rx)  . Ischemic cardiomyopathy     EF 40%;  improved to normal;  echo 9/11: EF 55-60%, inferior akinesis, moderate to severe LVH, moderate LAE, PASP 33 with EF 41% by nuclear 02/2011;  Last echo 4/13: mild LVH, EF 50-55%, basal inf AK, mild LAE.  [name] GERD (gastroesophageal reflux disease)   . Tobacco abuse   . Obesity   . OSA (obstructive sleep apnea)     Does not use CPAP as of 05/2011  . Depression   . Pneumonia   . CHF (congestive heart failure)     Current Outpatient Prescriptions  Medication Sig Dispense Refill  . amLODipine (NORVASC) 10 MG tablet Take 10 mg by mouth every morning.       [name] aspirin EC 81 MG tablet Take 81 mg by mouth every morning.       . benazepril (LOTENSIN) 40 MG tablet Take 40 mg by mouth every morning.       . carvedilol (COREG) 25 MG tablet Take 25 mg by mouth 2 (two) times daily with a meal.       . citalopram (CELEXA) 40 MG tablet Take 40 mg by mouth at bedtime.       . cloNIDine (CATAPRES) 0.2 MG tablet Take 0.2 mg by mouth 2 (two) times daily.      . clopidogrel (PLAVIX) 75 MG tablet Take 75 mg by mouth every morning.       [name]  famotidine (PEPCID) 40 MG tablet Take 40 mg by mouth every 12 (twelve) hours.       . furosemide (LASIX) 40 MG tablet Take 40 mg by mouth every morning.       [name] losartan (COZAAR) 100 MG tablet Take 100 mg by mouth every morning.       . nitroGLYCERIN (NITROSTAT) 0.4 MG SL tablet Place 0.4 mg under the tongue every 5 (five) minutes as needed. For chest pain      . potassium chloride SA (K-DUR,KLOR-CON) 20 MEQ tablet Take 20 mEq by mouth daily.       . pravastatin (PRAVACHOL) 80 MG tablet Take 80 mg by mouth daily.        Allergies: Allergies  Allergen Reactions  . Penicillins Other (See Comments)    Unknown childhood reaction    History  Substance Use Topics  . Smoking status: Current Everyday Smoker -- 0.5 packs/day for 15 years    Types: Cigarettes  . Smokeless tobacco: Never Used  . Alcohol Use: No     ROS:  Please see the history of present illness.   Notes a rash  like what he had with scabies in the past - pruritic.  All other systems reviewed and negative.   PHYSICAL EXAM: VS:  BP 126/80  Pulse 58  Ht 6' (1.829 m)  Wt 344 lb 12.8 oz (156.4 kg)  BMI 46.76 kg/m2  Well nourished, well developed, in no acute distress HEENT: normal Neck: no JVD Cardiac:  normal S1, S2; RRR; no murmur Lungs:  clear to auscultation bilaterally, no wheezing, rhonchi or rales Abd: soft, nontender, no hepatomegaly Ext: no edema Skin: warm and dry, few papular lesions noted on left arm Neuro:  CNs 2-12 intact, no focal abnormalities noted  EKG:  Sinus bradycardia, vent rate 58, normal axis, T wave inversions in leads 2, 3, aVF and V5-V6, no significant change compared to prior tracing,     ASSESSMENT AND PLAN:  1.  Hypertension:  Controlled.  Continue current therapy.   2.  Coronary Artery Disease:  Stable.  Has occasional, stable chest pain. Continue ASA and Plavix. Continue statin. Follow up with Dr. Tonny Bollman in 6 mos.  3.  Hyperlipidemia:  Lab Results  Component Value Date   CHOL 138 01/14/2012   HDL 47.50 01/14/2012   LDLCALC 82 01/14/2012   TRIG 41.0 01/14/2012   CHOLHDL 3 01/14/2012   Well controlled.  Continue current statin Rx.  4.  Sleep Apnea:  Continue follow up with Dr. Shelle Iron as directed.  5. Obesity:  We discussed weight loss.  6. Tobacco Abuse:  He knows he needs to quit.  7. Scabies:  Has a rash like what he had before.  Will give a Rx for Permethrin.  He needs to establish with a new PCP.   Signed, Tereso Newcomer, PA-C  10:45 AM 02/14/2012

## 2012-02-14 NOTE — Patient Instructions (Addendum)
Apply Permethrin Cream to skin from neck down.  Leave on overnight (8-12 hours).  Then wash off in the morning.  Use only once.  You have been referred to [facility] or [facility] out patient clinic for a primary pcp   Your physician recommends that you schedule a follow-up appointment with Dr. [name] in 6 months

## 2012-03-28 ENCOUNTER — Other Ambulatory Visit: Payer: Self-pay | Admitting: Cardiovascular Disease

## 2012-05-01 ENCOUNTER — Encounter: Payer: Self-pay | Admitting: Physician Assistant

## 2012-05-04 ENCOUNTER — Other Ambulatory Visit: Payer: Self-pay | Admitting: Pulmonary Disease

## 2012-05-04 DIAGNOSIS — G4733 Obstructive sleep apnea (adult) (pediatric): Secondary | ICD-10-CM

## 2012-05-08 ENCOUNTER — Encounter (HOSPITAL_COMMUNITY): Payer: Self-pay | Admitting: *Deleted

## 2012-05-08 ENCOUNTER — Emergency Department (HOSPITAL_COMMUNITY)
Admission: EM | Admit: 2012-05-08 | Discharge: 2012-05-08 | Disposition: A | Discharge status: Home / Self Care | Payer: Medicare HMO | Attending: Emergency Medicine | Admitting: Emergency Medicine

## 2012-05-08 DIAGNOSIS — Z951 Presence of aortocoronary bypass graft: Secondary | ICD-10-CM | POA: Insufficient documentation

## 2012-05-08 DIAGNOSIS — F3289 Other specified depressive episodes: Secondary | ICD-10-CM | POA: Insufficient documentation

## 2012-05-08 DIAGNOSIS — M25519 Pain in unspecified shoulder: Secondary | ICD-10-CM | POA: Insufficient documentation

## 2012-05-08 DIAGNOSIS — I10 Essential (primary) hypertension: Secondary | ICD-10-CM | POA: Insufficient documentation

## 2012-05-08 DIAGNOSIS — I2510 Atherosclerotic heart disease of native coronary artery without angina pectoris: Secondary | ICD-10-CM | POA: Insufficient documentation

## 2012-05-08 DIAGNOSIS — M549 Dorsalgia, unspecified: Secondary | ICD-10-CM | POA: Insufficient documentation

## 2012-05-08 DIAGNOSIS — G4733 Obstructive sleep apnea (adult) (pediatric): Secondary | ICD-10-CM | POA: Insufficient documentation

## 2012-05-08 DIAGNOSIS — F329 Major depressive disorder, single episode, unspecified: Secondary | ICD-10-CM | POA: Insufficient documentation

## 2012-05-08 DIAGNOSIS — I509 Heart failure, unspecified: Secondary | ICD-10-CM | POA: Insufficient documentation

## 2012-05-08 DIAGNOSIS — E669 Obesity, unspecified: Secondary | ICD-10-CM | POA: Insufficient documentation

## 2012-05-08 DIAGNOSIS — F17200 Nicotine dependence, unspecified, uncomplicated: Secondary | ICD-10-CM | POA: Insufficient documentation

## 2012-05-08 DIAGNOSIS — Z8719 Personal history of other diseases of the digestive system: Secondary | ICD-10-CM | POA: Insufficient documentation

## 2012-05-08 DIAGNOSIS — M25559 Pain in unspecified hip: Secondary | ICD-10-CM | POA: Insufficient documentation

## 2012-05-08 DIAGNOSIS — M25512 Pain in left shoulder: Secondary | ICD-10-CM

## 2012-05-08 DIAGNOSIS — M25552 Pain in left hip: Secondary | ICD-10-CM

## 2012-05-08 DIAGNOSIS — R350 Frequency of micturition: Secondary | ICD-10-CM | POA: Insufficient documentation

## 2012-05-08 DIAGNOSIS — E785 Hyperlipidemia, unspecified: Secondary | ICD-10-CM | POA: Insufficient documentation

## 2012-05-08 DIAGNOSIS — I2589 Other forms of chronic ischemic heart disease: Secondary | ICD-10-CM | POA: Insufficient documentation

## 2012-05-08 DIAGNOSIS — Z8701 Personal history of pneumonia (recurrent): Secondary | ICD-10-CM | POA: Insufficient documentation

## 2012-05-08 DIAGNOSIS — Z7982 Long term (current) use of aspirin: Secondary | ICD-10-CM | POA: Insufficient documentation

## 2012-05-08 DIAGNOSIS — Z79899 Other long term (current) drug therapy: Secondary | ICD-10-CM | POA: Insufficient documentation

## 2012-05-08 MED ORDER — CYCLOBENZAPRINE HCL 10 MG PO TABS: 10.0000 mg | ORAL_TABLET | Freq: Two times a day (BID) | ORAL | Status: DC | PRN

## 2012-05-08 MED ORDER — HYDROCODONE-ACETAMINOPHEN 5-500 MG PO TABS: 1.0000 | ORAL_TABLET | Freq: Four times a day (QID) | ORAL | Status: DC | PRN
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
## 2012-05-08 NOTE — ED Notes (Signed)
Pt presents to ED with c/o lower back/hip pain as well as shoulder pain. Pt sts it could be from old injury. Pt also c/o frequent urination last few weeks and sts "I also have some problems with my prostate" but can't specifically state what kind of problems. Pt sts it's been a while since the last time he saw his urologist. Pt is sitting up in bed  Comfortably.

## 2012-05-08 NOTE — ED Provider Notes (Signed)
History/physical exam/procedure(s) were performed by non-physician practitioner and as supervising physician I was immediately available for consultation/collaboration. I have reviewed all notes and am in agreement with care and plan.   Hilario Quarry, MD 05/08/12 [phone]

## 2012-05-20 ENCOUNTER — Other Ambulatory Visit: Payer: Self-pay | Admitting: Cardiovascular Disease

## 2012-08-31 ENCOUNTER — Inpatient Hospital Stay (HOSPITAL_COMMUNITY)
Admission: EM | Admit: 2012-08-31 | Discharge: 2012-09-02 | DRG: 247 | Disposition: A | Discharge status: Home / Self Care | Payer: Medicare HMO | Attending: Cardiovascular Disease | Admitting: Cardiovascular Disease

## 2012-08-31 ENCOUNTER — Encounter (HOSPITAL_COMMUNITY): Payer: Self-pay | Admitting: Physical Medicine and Rehabilitation

## 2012-08-31 ENCOUNTER — Emergency Department (HOSPITAL_COMMUNITY): Payer: Medicare HMO

## 2012-08-31 ENCOUNTER — Encounter (HOSPITAL_COMMUNITY)
Admission: EM | Disposition: A | Discharge status: Home / Self Care | Payer: Medicare HMO | Source: Home / Self Care | Attending: Cardiovascular Disease

## 2012-08-31 DIAGNOSIS — E785 Hyperlipidemia, unspecified: Secondary | ICD-10-CM | POA: Diagnosis present

## 2012-08-31 DIAGNOSIS — E119 Type 2 diabetes mellitus without complications: Secondary | ICD-10-CM | POA: Diagnosis present

## 2012-08-31 DIAGNOSIS — Y831 Surgical operation with implant of artificial internal device as the cause of abnormal reaction of the patient, or of later complication, without mention of misadventure at the time of the procedure: Secondary | ICD-10-CM | POA: Diagnosis present

## 2012-08-31 DIAGNOSIS — I2589 Other forms of chronic ischemic heart disease: Secondary | ICD-10-CM | POA: Diagnosis present

## 2012-08-31 DIAGNOSIS — E876 Hypokalemia: Secondary | ICD-10-CM | POA: Diagnosis not present

## 2012-08-31 DIAGNOSIS — F3289 Other specified depressive episodes: Secondary | ICD-10-CM | POA: Diagnosis present

## 2012-08-31 DIAGNOSIS — Z720 Tobacco use: Secondary | ICD-10-CM

## 2012-08-31 DIAGNOSIS — F329 Major depressive disorder, single episode, unspecified: Secondary | ICD-10-CM | POA: Diagnosis present

## 2012-08-31 DIAGNOSIS — T82897A Other specified complication of cardiac prosthetic devices, implants and grafts, initial encounter: Secondary | ICD-10-CM | POA: Diagnosis present

## 2012-08-31 DIAGNOSIS — I213 ST elevation (STEMI) myocardial infarction of unspecified site: Secondary | ICD-10-CM

## 2012-08-31 DIAGNOSIS — I509 Heart failure, unspecified: Secondary | ICD-10-CM | POA: Diagnosis present

## 2012-08-31 DIAGNOSIS — Z87891 Personal history of nicotine dependence: Secondary | ICD-10-CM

## 2012-08-31 DIAGNOSIS — I10 Essential (primary) hypertension: Secondary | ICD-10-CM | POA: Diagnosis present

## 2012-08-31 DIAGNOSIS — I255 Ischemic cardiomyopathy: Secondary | ICD-10-CM | POA: Diagnosis present

## 2012-08-31 DIAGNOSIS — F17200 Nicotine dependence, unspecified, uncomplicated: Secondary | ICD-10-CM | POA: Diagnosis present

## 2012-08-31 DIAGNOSIS — I214 Non-ST elevation (NSTEMI) myocardial infarction: Secondary | ICD-10-CM

## 2012-08-31 DIAGNOSIS — Z9861 Coronary angioplasty status: Secondary | ICD-10-CM

## 2012-08-31 DIAGNOSIS — Z79899 Other long term (current) drug therapy: Secondary | ICD-10-CM

## 2012-08-31 DIAGNOSIS — I2510 Atherosclerotic heart disease of native coronary artery without angina pectoris: Secondary | ICD-10-CM

## 2012-08-31 DIAGNOSIS — I252 Old myocardial infarction: Secondary | ICD-10-CM | POA: Diagnosis not present

## 2012-08-31 DIAGNOSIS — I219 Acute myocardial infarction, unspecified: Secondary | ICD-10-CM

## 2012-08-31 DIAGNOSIS — Z7902 Long term (current) use of antithrombotics/antiplatelets: Secondary | ICD-10-CM

## 2012-08-31 DIAGNOSIS — K219 Gastro-esophageal reflux disease without esophagitis: Secondary | ICD-10-CM | POA: Diagnosis present

## 2012-08-31 DIAGNOSIS — I2584 Coronary atherosclerosis due to calcified coronary lesion: Secondary | ICD-10-CM | POA: Diagnosis present

## 2012-08-31 DIAGNOSIS — Z8249 Family history of ischemic heart disease and other diseases of the circulatory system: Secondary | ICD-10-CM

## 2012-08-31 DIAGNOSIS — Z7982 Long term (current) use of aspirin: Secondary | ICD-10-CM

## 2012-08-31 DIAGNOSIS — G4733 Obstructive sleep apnea (adult) (pediatric): Secondary | ICD-10-CM | POA: Diagnosis present

## 2012-08-31 DIAGNOSIS — Y92009 Unspecified place in unspecified non-institutional (private) residence as the place of occurrence of the external cause: Secondary | ICD-10-CM

## 2012-08-31 DIAGNOSIS — I2109 ST elevation (STEMI) myocardial infarction involving other coronary artery of anterior wall: Principal | ICD-10-CM | POA: Diagnosis present

## 2012-08-31 DIAGNOSIS — Z880 Allergy status to penicillin: Secondary | ICD-10-CM

## 2012-08-31 DIAGNOSIS — I2582 Chronic total occlusion of coronary artery: Secondary | ICD-10-CM | POA: Diagnosis present

## 2012-08-31 DIAGNOSIS — Z955 Presence of coronary angioplasty implant and graft: Secondary | ICD-10-CM

## 2012-08-31 DIAGNOSIS — E669 Obesity, unspecified: Secondary | ICD-10-CM | POA: Diagnosis present

## 2012-08-31 HISTORY — DX: Personal history of pneumonia (recurrent): Z87.01

## 2012-08-31 HISTORY — PX: LEFT HEART CATHETERIZATION WITH CORONARY ANGIOGRAM: SHX5451

## 2012-08-31 HISTORY — DX: Type 2 diabetes mellitus without complications: E11.9

## 2012-08-31 HISTORY — PX: PERCUTANEOUS CORONARY STENT INTERVENTION (PCI-S): SHX5485

## 2012-08-31 LAB — POCT I-STAT, CHEM 8
Creatinine, Ser: 1.2 mg/dL (ref 0.50–1.35)
Glucose, Bld: 182 mg/dL — ABNORMAL HIGH (ref 70–99)
HCT: 52 % (ref 39.0–52.0)
Hemoglobin: 17.7 g/dL — ABNORMAL HIGH (ref 13.0–17.0)
Potassium: 3.7 mEq/L (ref 3.5–5.1)
Sodium: 144 mEq/L (ref 135–145)
TCO2: 30 mmol/L (ref 0–100)

## 2012-08-31 LAB — COMPREHENSIVE METABOLIC PANEL
Alkaline Phosphatase: 100 U/L (ref 39–117)
BUN: 13 mg/dL (ref 6–23)
Calcium: 9.5 mg/dL (ref 8.4–10.5)
GFR calc Af Amer: >90 mL/min (ref >90)
Glucose, Bld: 190 mg/dL — ABNORMAL HIGH (ref 70–99)
Total Protein: 8.5 g/dL — ABNORMAL HIGH (ref 6.0–8.3)

## 2012-08-31 LAB — BASIC METABOLIC PANEL
BUN: 12 mg/dL (ref 6–23)
Chloride: 104 mEq/L (ref 96–112)
Glucose, Bld: 171 mg/dL — ABNORMAL HIGH (ref 70–99)
Potassium: 3.9 mEq/L (ref 3.5–5.1)

## 2012-08-31 LAB — APTT: aPTT: 31 seconds (ref 24–37)

## 2012-08-31 LAB — PROTIME-INR: Prothrombin Time: 11.7 seconds (ref 11.6–15.2)

## 2012-08-31 LAB — POCT I-STAT TROPONIN I: Troponin i, poc: 0 ng/mL (ref 0.00–0.08)

## 2012-08-31 LAB — CBC
RBC: 5.95 MIL/uL — ABNORMAL HIGH (ref 4.22–5.81)
RDW: 15.9 % — ABNORMAL HIGH (ref 11.5–15.5)
WBC: 8.1 10*3/uL (ref 4.0–10.5)

## 2012-08-31 LAB — TROPONIN I: Troponin I: 0.78 ng/mL (ref <0.30)

## 2012-08-31 SURGERY — LEFT HEART CATHETERIZATION WITH CORONARY ANGIOGRAM
Anesthesia: LOCAL | Site: Groin | Laterality: Right

## 2012-08-31 MED ORDER — LOSARTAN POTASSIUM 50 MG PO TABS: 100.0000 mg | ORAL_TABLET | Freq: Every day | ORAL | Status: DC

## 2012-08-31 MED ORDER — MORPHINE SULFATE 2 MG/ML IJ SOLN: 2.0000 mg | INTRAMUSCULAR | Status: DC | PRN

## 2012-08-31 MED ORDER — LOSARTAN POTASSIUM 50 MG PO TABS
100.0000 mg | ORAL_TABLET | Freq: Every day | ORAL | Status: DC
  Administered 2012-08-31 – 2012-09-02 (×3): 100 mg via ORAL
  Filled 2012-08-31 (×3): qty 2

## 2012-08-31 MED ORDER — ASPIRIN EC 81 MG PO TBEC
81.0000 mg | DELAYED_RELEASE_TABLET | Freq: Every morning | ORAL | Status: DC
  Administered 2012-09-01 – 2012-09-02 (×2): 81 mg via ORAL
  Filled 2012-08-31 (×2): qty 1

## 2012-08-31 MED ORDER — NITROGLYCERIN IN D5W 200-5 MCG/ML-% IV SOLN
2.0000 ug/min | INTRAVENOUS | Status: DC
  Administered 2012-08-31: 20 ug/min via INTRAVENOUS

## 2012-08-31 MED ORDER — NITROGLYCERIN 0.4 MG SL SUBL
SUBLINGUAL_TABLET | SUBLINGUAL | Status: AC
  Administered 2012-08-31: 0.4 mg via SUBLINGUAL
  Filled 2012-08-31: qty 25

## 2012-08-31 MED ORDER — HEPARIN (PORCINE) IN NACL 2-0.9 UNIT/ML-% IJ SOLN
INTRAMUSCULAR | Status: AC
  Filled 2012-08-31: qty 1000

## 2012-08-31 MED ORDER — HEPARIN SODIUM (PORCINE) 5000 UNIT/ML IJ SOLN: 60.0000 [IU]/kg | INTRAMUSCULAR | Status: DC

## 2012-08-31 MED ORDER — CITALOPRAM HYDROBROMIDE 40 MG PO TABS
40.0000 mg | ORAL_TABLET | Freq: Every day | ORAL | Status: DC
  Administered 2012-08-31 – 2012-09-01 (×2): 40 mg via ORAL
  Filled 2012-08-31 (×3): qty 1

## 2012-08-31 MED ORDER — HEPARIN SODIUM (PORCINE) 5000 UNIT/ML IJ SOLN
4000.0000 [IU] | Freq: Once | INTRAMUSCULAR | Status: AC
  Administered 2012-08-31: 4000 [IU] via INTRAVENOUS
  Filled 2012-08-31: qty 1

## 2012-08-31 MED ORDER — NITROGLYCERIN 1 MG/10 ML FOR IR/CATH LAB
INTRA_ARTERIAL | Status: AC
  Filled 2012-08-31: qty 10

## 2012-08-31 MED ORDER — FAMOTIDINE 40 MG PO TABS
40.0000 mg | ORAL_TABLET | Freq: Two times a day (BID) | ORAL | Status: DC
  Administered 2012-08-31 – 2012-09-02 (×4): 40 mg via ORAL
  Filled 2012-08-31 (×5): qty 1

## 2012-08-31 MED ORDER — SODIUM CHLORIDE 0.9 % IV SOLN
INTRAVENOUS | Status: AC
  Administered 2012-08-31: 17:00:00 via INTRAVENOUS

## 2012-08-31 MED ORDER — CLOPIDOGREL BISULFATE 75 MG PO TABS
75.0000 mg | ORAL_TABLET | Freq: Every morning | ORAL | Status: DC
  Administered 2012-09-01 – 2012-09-02 (×2): 75 mg via ORAL
  Filled 2012-08-31 (×2): qty 1

## 2012-08-31 MED ORDER — CARVEDILOL 25 MG PO TABS
25.0000 mg | ORAL_TABLET | Freq: Two times a day (BID) | ORAL | Status: DC
  Administered 2012-09-01 – 2012-09-02 (×3): 25 mg via ORAL
  Filled 2012-08-31 (×5): qty 1

## 2012-08-31 MED ORDER — NITROGLYCERIN IN D5W 200-5 MCG/ML-% IV SOLN: 20.0000 ug/min | Freq: Once | INTRAVENOUS | Status: AC

## 2012-08-31 MED ORDER — FENTANYL CITRATE 0.05 MG/ML IJ SOLN
INTRAMUSCULAR | Status: AC
  Filled 2012-08-31: qty 2

## 2012-08-31 MED ORDER — NITROGLYCERIN IN D5W 200-5 MCG/ML-% IV SOLN
INTRAVENOUS | Status: AC
  Administered 2012-08-31: 20 ug/min via INTRAVENOUS
  Filled 2012-08-31: qty 250

## 2012-08-31 MED ORDER — ACETAMINOPHEN 325 MG PO TABS
650.0000 mg | ORAL_TABLET | ORAL | Status: DC | PRN
  Administered 2012-08-31: 650 mg via ORAL
  Filled 2012-08-31: qty 2

## 2012-08-31 MED ORDER — OXYCODONE-ACETAMINOPHEN 5-325 MG PO TABS: 1.0000 | ORAL_TABLET | ORAL | Status: DC | PRN

## 2012-08-31 MED ORDER — BIVALIRUDIN 250 MG IV SOLR
INTRAVENOUS | Status: AC
  Filled 2012-08-31: qty 250

## 2012-08-31 MED ORDER — FUROSEMIDE 40 MG PO TABS
40.0000 mg | ORAL_TABLET | Freq: Every morning | ORAL | Status: DC
  Administered 2012-09-01 – 2012-09-02 (×2): 40 mg via ORAL
  Filled 2012-08-31 (×2): qty 1

## 2012-08-31 MED ORDER — ONDANSETRON HCL 4 MG/2ML IJ SOLN: 4.0000 mg | Freq: Four times a day (QID) | INTRAMUSCULAR | Status: DC | PRN

## 2012-08-31 MED ORDER — ATORVASTATIN CALCIUM 80 MG PO TABS
80.0000 mg | ORAL_TABLET | Freq: Every day | ORAL | Status: DC
  Administered 2012-08-31 – 2012-09-01 (×2): 80 mg via ORAL
  Filled 2012-08-31 (×3): qty 1

## 2012-08-31 MED ORDER — NITROGLYCERIN 0.4 MG SL SUBL: 0.4000 mg | SUBLINGUAL_TABLET | SUBLINGUAL | Status: DC | PRN

## 2012-08-31 MED ORDER — POTASSIUM CHLORIDE CRYS ER 20 MEQ PO TBCR
20.0000 meq | EXTENDED_RELEASE_TABLET | Freq: Every day | ORAL | Status: DC
  Administered 2012-09-02: 20 meq via ORAL
  Filled 2012-08-31 (×2): qty 1

## 2012-08-31 MED ORDER — ZOLPIDEM TARTRATE 5 MG PO TABS: 5.0000 mg | ORAL_TABLET | Freq: Every evening | ORAL | Status: DC | PRN

## 2012-08-31 MED ORDER — SODIUM CHLORIDE 0.9 % IV SOLN: INTRAVENOUS | Status: DC

## 2012-08-31 MED ORDER — HYDRALAZINE HCL 20 MG/ML IJ SOLN
10.0000 mg | INTRAMUSCULAR | Status: DC | PRN
  Administered 2012-09-01 (×2): 10 mg via INTRAVENOUS
  Filled 2012-08-31 (×2): qty 1

## 2012-08-31 MED ORDER — MIDAZOLAM HCL 2 MG/2ML IJ SOLN
INTRAMUSCULAR | Status: AC
  Filled 2012-08-31: qty 2

## 2012-08-31 MED ORDER — LIDOCAINE HCL (PF) 1 % IJ SOLN
INTRAMUSCULAR | Status: AC
  Filled 2012-08-31: qty 30

## 2012-08-31 MED ORDER — CLONIDINE HCL 0.2 MG PO TABS
0.2000 mg | ORAL_TABLET | Freq: Two times a day (BID) | ORAL | Status: DC
  Administered 2012-08-31 – 2012-09-02 (×4): 0.2 mg via ORAL
  Filled 2012-08-31 (×5): qty 1

## 2012-08-31 MED ORDER — AMLODIPINE BESYLATE 10 MG PO TABS
10.0000 mg | ORAL_TABLET | Freq: Every day | ORAL | Status: DC
  Administered 2012-08-31 – 2012-09-02 (×3): 10 mg via ORAL
  Filled 2012-08-31 (×3): qty 1

## 2012-08-31 MED ORDER — CLOPIDOGREL BISULFATE 300 MG PO TABS
ORAL_TABLET | ORAL | Status: AC
  Filled 2012-08-31: qty 1

## 2012-08-31 NOTE — ED Notes (Signed)
Pt to Cath Lab

## 2012-08-31 NOTE — CV Procedure (Addendum)
Cardiac Catheterization Operative Report  Hosey Burmester [id number] 3/23/20144:42 PM No primary provider on file.  Procedure Performed:  1. Left Heart Catheterization 2. Selective Coronary Angiography 3. Left ventricular angiogram 4. PTCA/DES x 1 mid LAD  Operator: Verne Carrow, MD  Indication:  57 yo male with history CAD with multiple prior PCI, ischemic cardiomyopathy, HTN, HLD, GERD, OSA, tobacco abuse, obesity to ED with c/o chest pain. EKG with dynamic ST elevation precordial leads. Initial EKG in EMS with ST elevation that resolved by the time he arrived in the ED. Pt pain free at time of arrival to ED so code STEMI not initially called. 10 minutes after arrival to ED, pt with recurrence of left sided chest pain and EKG with dynamic ST elevation precordial leads. Urgent cath planned.     Note: This case is an urgent, NSTEMI cath. The patient was pain free with resolution of EKG changes at time of arrival to cath lab.                              Procedure Details: The risks, benefits, complications, treatment options, and expected outcomes were discussed with the patient. The patient and/or family concurred with the proposed plan. Emergency consent obtained. The patient was brought to the cath lab after IV hydration was begun and oral premedication was given. The patient was further sedated with Versed and Fentanyl. The right groin was prepped and draped in the usual manner. Using the modified Seldinger access technique, a 6 French sheath was placed in the right femoral artery. Standard diagnostic catheters were used to perform selective coronary angiography. The patient was found to have a hazy, severe stenosis in the mid LAD which was felt to be his culprit lesion. He was given a bolus of Angiomax and a drip was started. He was given an additional 300 mg Plavix po x 1. (He takes this medication daily). I then engaged the left main with a XB LAD 3.5 guiding catheter. When the  ACT was greater than 200, I passed a BMW wire down the LAD. I then pre-dilated the stenosis with a 2.5 x 15 mm balloon x 1. I then carefully positioned and deployed a 3.0 x 20 mm Promus Premier DES in the mid LAD. The stent was post-dilated with a 3.25 x 15 mm [place] balloon. The stenosis was taken from 95% down to 0%. There was excellent flow into the distal vessel. The guide was removed. A pigtail catheter was used to perform a left ventricular angiogram. An Angioseal femoral artery closure device was placed in the right femoral artery.   There were no immediate complications. The patient was taken to the recovery area in stable condition.   Hemodynamic Findings: Central aortic pressure: 179/98 Left ventricular pressure: 177/10/18  Angiographic Findings:  Left main:  No obstructive disease noted.   Left Anterior Descending Artery: Large caliber vessel that courses to the apex. The proximal vessel has no significant disease. The mid vessel has a hazy 95-99% stenosis just before a large caliber diagonal branch. The distal vessel has diffuse disease with serial 95% stenoses in the very distal LAD/apical segment (1.0-1.5 mm). The diagonal branch is large in caliber with a 50% mid stenosis.   Circumflex Artery: Large caliber vessel with 30% diffuse proximal stenosis. There are several very small caliber obtuse marginal branches which are sub-totally occluded and then 2 moderate caliber posterolateral branches. The first posterolateral branch has a patent  stent in the mid vessel. Just prior to that stent is a 70-80% stenosis that does not appear to be flow limiting. The second posterolateral branch has mild plaque. The continuation of the AV groove Circumflex beyond the last PL branch becomes small in caliber and has a focal 95% stenosis.   Right Coronary Artery: Large caliber co-dominant vessel diffuse 30% proximal stenosis. The entire mid segment is stented. The proximal portion of the mid stented segment  has 40% restenosis and the more distal portion of the stented segment has 70% restenosis. The distal vessel gives of an acute marginal branch and a PDA branch. Neither of these branches has significant obstructive disease.   Left Ventricular Angiogram: LVEF 35-40% with hypokinesis of the inferior base. The apex is not well visualized.   Impression: 1. NSTEMI with severe stenosis mid LAD 2. Diffuse moderate disease in the RCA and Circumflex 3. Segmental LV systolic dysfunction 4. Successful PTCA/DES x 1 mid LAD  Recommendations: He will need continuation of dual anti-platelet therapy with ASA and Plavix for at least one year. Continue statin, beta blocker, ARB. Will arrange Echo in am. Medical management of disease in Circumflex and RCA for now.        Complications:  None. The patient tolerated the procedure well.

## 2012-08-31 NOTE — H&P (Signed)
HPI:  This gentleman was moving clothes around this am and had onset of chest pain.  It came and went and came back, and he called EMS and there was STE on the initial tracing that then resolved.  The pain then came back and they called Dr. Clifton James with slightly more STE, and code STEMI was activated.  He is now pain free again, and the plan is for urgent cardiac cath study.  He has had prior PCI as noted:    FINDINGS: Right atrial pressure 16 mmHg, right ventricular pressure  38/16, pulmonary artery pressures 39/21 with a mean of 28. Pulmonary  capillary wedge pressures 16, left ventricular pressure is 139/26,  aortic pressure is 140/90 with a mean of 114.  Oxygen saturation in the superior vena cava is 58%. Pulmonary artery  69%, aorta is 97%. Fick cardiac output is 5 liters per minute.  Coronary angiography. The left mainstem is angiographically normal. It  bifurcates into the LAD and left circumflex.  The LAD is a large-caliber vessel that courses down the left ventricular  apex. It is a diffusely diseased vessel. The proximal portion of the  vessel has no significant obstructive disease. The midportion of the  vessel has an area of 40-50% stenosis at worst. The distal vessel is  diffusely diseased with diffuse nonobstructive plaque. There is a large  first diagonal branch that has no significant angiographic disease.  The left circumflex is a large-caliber vessel. There is some minor  nonobstructive plaque in the proximal portion. There is some very small  obtuse marginal branches proximally. Distally there are 2 left  posterolateral branches. The first of which has a stented segment that  is widely patent with minimal restenosis. The second posterolateral  branch has no significant angiographic disease. The distal circumflex  beyond both posterolaterals has a 50% stenosis.  The right coronary artery is diffusely diseased through its midportion.  The entire midportion of the  vessel is stented. There is an area in the  proximal portion of the stent that appears aneurysmal. It also it could  possibly be an occluded side branch. The midportion of the right  coronary artery has a focal area that is slightly hazy and  angiographically appears to be 80% stenotic. The distal vessel gives  off a PDA branch and an acute marginal branch. There is no significant  disease in either of those branches.  Left ventricular function assessed in the 30 degrees RAO projection  shows normal LV systolic function with an estimated EF of 55 to 60%.  ASSESSMENT:  1. High-grade in-stent restenosis in the right coronary artery.  2. Mild to moderate LAD stenosis.  3. Patent left circumflex stent with mild to moderate nonobstructive  disease in the distal circumflex.  4. Normal left ventricular function.  5. Successful PTCA with cutting balloon and noncompliant balloon  angioplasty of the right coronary artery.  6. Mild pulmonary hypertension.  PLAN: [name] will need to continue with lifestyle modification and  risk factor changes. He should continue with ongoing dual antiplatelet  therapy.  [name]. [name], MD  Electronically Signed    Current Facility-Administered Medications  Medication Dose Route Frequency Provider Last Rate Last Dose  . 0.9 %  sodium chloride infusion   Intravenous Continuous Vida Roller, MD      . nitroGLYCERIN (NITROSTAT) SL tablet 0.4 mg  0.4 mg Sublingual Q5 min PRN Vida Roller, MD   0.4 mg at 08/31/12 1500  Current Outpatient Prescriptions  Medication Sig Dispense Refill  . aspirin EC 81 MG tablet Take 81 mg by mouth every morning.       . benazepril (LOTENSIN) 40 MG tablet Take 40 mg by mouth every morning.       . carvedilol (COREG) 25 MG tablet Take 25 mg by mouth 2 (two) times daily with a meal.       . citalopram (CELEXA) 40 MG tablet Take 40 mg by mouth at bedtime.       . cloNIDine (CATAPRES) 0.2 MG tablet Take 0.2 mg by mouth 2 (two)  times daily.      . clopidogrel (PLAVIX) 75 MG tablet Take 75 mg by mouth every morning.       . famotidine (PEPCID) 40 MG tablet Take 40 mg by mouth every 12 (twelve) hours.       . furosemide (LASIX) 40 MG tablet Take 40 mg by mouth every morning.       [name] losartan (COZAAR) 100 MG tablet Take 100 mg by mouth daily.       . nitroGLYCERIN (NITROSTAT) 0.4 MG SL tablet Place 0.4 mg under the tongue every 5 (five) minutes as needed. For chest pain      . potassium chloride SA (K-DUR,KLOR-CON) 20 MEQ tablet Take 20 mEq by mouth daily.       . pravastatin (PRAVACHOL) 80 MG tablet Take 80 mg by mouth daily.      [name] amLODipine (NORVASC) 10 MG tablet Take 10 mg by mouth every morning.         Allergies  Allergen Reactions  . Penicillins Other (See Comments)    Unknown childhood reaction    Past Medical History  Diagnosis Date  . Dyslipidemia   . HTN (hypertension)   . CAD (coronary artery disease)     a. s/p PTCA/stent to prox RCA 2002. b. s/p PTCA prox RCA and DES to prox-mid RCA 2006. c. s/p PTCA/DES to OM4 2007. d. s/p PTCA/cutting balloon to RCA for ISR 10/2006 with EF 55-60% at that time (last cath). e. [name] 02/2011 showing mod scar in the base/mid inferior and inferolateral segments;  Last Myoview 9/12: mod scar in base/mid inf and IL, slight peri-infarct isch - low risk (med Rx)  . Ischemic cardiomyopathy     EF 40%; improved to normal;  echo 9/11: EF 55-60%, inferior akinesis, moderate to severe LVH, moderate LAE, PASP 33 with EF 41% by nuclear 02/2011;  Last echo 4/13: mild LVH, EF 50-55%, basal inf AK, mild LAE.  [name] GERD (gastroesophageal reflux disease)   . Tobacco abuse   . Obesity   . OSA (obstructive sleep apnea)     Does not use CPAP as of 05/2011  . Depression   . Pneumonia   . CHF (congestive heart failure)   . MI (myocardial infarction)     Past Surgical History  Procedure Laterality Date  . Coronary stent placement  2009    Family History  Problem Relation Age of  Onset  . Coronary artery disease    . Depression Mother   . Cancer Mother     ovarian  . Hypertension Mother   . Other Father     motor vehicle accident    History   Social History  . Marital Status: Single    Spouse Name: N/A    Number of Children: N/A  . Years of Education: N/A   Occupational History  . retired    Chief Executive Officer  History Main Topics  . Smoking status: Current Every Day Smoker -- 0.50 packs/day for 15 years    Types: Cigarettes  . Smokeless tobacco: Never Used  . Alcohol Use: No  . Drug Use: No  . Sexually Active: Not on file   Other Topics Concern  . Not on file   Social History Narrative  . No narrative on file    ROS: Please see the HPI.  All other systems reviewed and negative.  PHYSICAL EXAM:  BP 161/86  Pulse 59  Temp(Src) 98 F (36.7 C) (Oral)  Resp 19  Ht 6' (1.829 m)  Wt 300 lb (136.079 kg)  BMI 40.68 kg/m2  SpO2 93%  General: Well developed, well nourished, in no acute distress. Head:  Normocephalic and atraumatic. Neck: no JVD Lungs: Clear to auscultation and percussion. Heart: Normal S1 and S2. Pos S4 gallop.   Abdomen:  Normal bowel sounds; soft; non tender; no organomegaly Pulses: Pulses normal in all 4 extremities. Extremities: No clubbing or cyanosis. No edema. Neurologic: Alert and oriented x 3.  EKG: NSR.  LVH.  STE in V1-V3.    Recent echocardiogram Study Conclusions  - Left ventricle: The cavity size was normal. There was mild concentric hypertrophy. Systolic function was normal. The estimated ejection fraction was in the range of 50% to 55%. Probable akinesis of the basalinferior myocardium. Left ventricular diastolic function parameters were normal. - Left atrium: The atrium was mildly dilated.     ASSESSMENT AND PLAN:  1.  ACS with recurrent chest pain and dynamic ST segment changes.  2.  Prior RCA stent 3.  Ischemic CM with reduced LV 4.  HTN poor control 5. OSA with poor compliance with CPAP.  6.   Dyslipidemia 7.  Tobacco abuse  Plan   1.  Urgent cardiac catheterization.  Dr. Clifton James in the lab.  Initial K is elevated, but repeat is normal.

## 2012-08-31 NOTE — ED Notes (Signed)
Pt does not remember any phone numbers of family or friends. Pt gave business card of a Friend from [facility] [employer] named Burnis Halling [phone], Cell phone.

## 2012-08-31 NOTE — ED Provider Notes (Signed)
History     CSN: [id number]  Arrival date & time 08/31/12  1427   First MD Initiated Contact with Patient 08/31/12 1432      Chief Complaint  Patient presents with  . Chest Pain    (Consider location/radiation/quality/duration/timing/severity/associated sxs/prior treatment) HPI Comments: The patient is a 57 year old male with a history of hypertension, tobacco abuse, coronary disease status post multiple percutaneous coronary interventions and an ejection fraction of 55% by echocardiogram in 2011, 41% by a nuclear study in 2012. He presents by ambulance because of a complaint of chest pain which he described as a heaviness in the middle of his chest which occurred while he was pulling a heavy bag of laundry earlier in the day. He had some lightheadedness and generalized weakness. He was given a full, 325 mg of aspirin, 81 mg by himself, the rest by ambulance. He was given nitroglycerin in route and on arrival the patient is chest pain-free. His initial EKG as faxed by EMS shows elevations in the anterior leads.  Patient is a 57 y.o. male presenting with chest pain. The history is provided by the patient, the EMS personnel and medical records.  Chest Pain   Past Medical History  Diagnosis Date  . Dyslipidemia   . HTN (hypertension)   . CAD (coronary artery disease)     a. s/p PTCA/stent to prox RCA 2002. b. s/p PTCA prox RCA and DES to prox-mid RCA 2006. c. s/p PTCA/DES to OM4 2007. d. s/p PTCA/cutting balloon to RCA for ISR 10/2006 with EF 55-60% at that time (last cath). e. [name] 02/2011 showing mod scar in the base/mid inferior and inferolateral segments;  Last Myoview 9/12: mod scar in base/mid inf and IL, slight peri-infarct isch - low risk (med Rx)  . Ischemic cardiomyopathy     EF 40%; improved to normal;  echo 9/11: EF 55-60%, inferior akinesis, moderate to severe LVH, moderate LAE, PASP 33 with EF 41% by nuclear 02/2011;  Last echo 4/13: mild LVH, EF 50-55%, basal inf AK, mild LAE.   [name] GERD (gastroesophageal reflux disease)   . Tobacco abuse   . Obesity   . OSA (obstructive sleep apnea)     Does not use CPAP as of 05/2011  . Depression   . Pneumonia   . CHF (congestive heart failure)   . MI (myocardial infarction)     Past Surgical History  Procedure Laterality Date  . Coronary stent placement  2009    Family History  Problem Relation Age of Onset  . Coronary artery disease    . Depression Mother   . Cancer Mother     ovarian  . Hypertension Mother   . Other Father     motor vehicle accident    History  Substance Use Topics  . Smoking status: Current Every Day Smoker -- 0.50 packs/day for 15 years    Types: Cigarettes  . Smokeless tobacco: Never Used  . Alcohol Use: No      Review of Systems  Cardiovascular: Positive for chest pain.  All other systems reviewed and are negative.    Allergies  Penicillins  Home Medications   Current Outpatient Rx  Name  Route  Sig  Dispense  Refill  . amLODipine (NORVASC) 10 MG tablet   Oral   Take 10 mg by mouth every morning.          [name] aspirin EC 81 MG tablet   Oral   Take 81 mg by mouth every morning.          [name]  benazepril (LOTENSIN) 40 MG tablet   Oral   Take 40 mg by mouth every morning.          . carvedilol (COREG) 25 MG tablet   Oral   Take 25 mg by mouth 2 (two) times daily with a meal.          . citalopram (CELEXA) 40 MG tablet   Oral   Take 40 mg by mouth at bedtime.          . cloNIDine (CATAPRES) 0.2 MG tablet   Oral   Take 0.2 mg by mouth 2 (two) times daily.         . clopidogrel (PLAVIX) 75 MG tablet   Oral   Take 75 mg by mouth every morning.          . clopidogrel (PLAVIX) 75 MG tablet      TAKE 1 TABLET BY MOUTH EVERY DAY   30 tablet   0   . cyclobenzaprine (FLEXERIL) 10 MG tablet   Oral   Take 1 tablet (10 mg total) by mouth 2 (two) times daily as needed for muscle spasms.   20 tablet   0   . famotidine (PEPCID) 40 MG tablet   Oral    Take 40 mg by mouth every 12 (twelve) hours.          . furosemide (LASIX) 40 MG tablet   Oral   Take 40 mg by mouth every morning.          [name] HYDROcodone-acetaminophen (VICODIN) 5-500 MG per tablet   Oral   Take 1-2 tablets by mouth every 6 (six) hours as needed for pain.   15 tablet   0   . losartan (COZAAR) 100 MG tablet   Oral   Take 100 mg by mouth daily. Take one tablet by mouth daily in place of AVAPRO         . losartan (COZAAR) 100 MG tablet               . nitroGLYCERIN (NITROSTAT) 0.4 MG SL tablet   Sublingual   Place 0.4 mg under the tongue every 5 (five) minutes as needed. For chest pain         . potassium chloride SA (K-DUR,KLOR-CON) 20 MEQ tablet   Oral   Take 40 mEq by mouth daily.          . pravastatin (PRAVACHOL) 80 MG tablet   Oral   Take 80 mg by mouth daily.           BP 176/103  Temp(Src) 98 F (36.7 C) (Oral)  Resp 19  Ht 6' (1.829 m)  Wt 300 lb (136.079 kg)  BMI 40.68 kg/m2  SpO2 96%  Physical Exam  Nursing note and vitals reviewed. Constitutional: He appears well-developed and well-nourished. No distress.  HENT:  Head: Normocephalic and atraumatic.  Mouth/Throat: Oropharynx is clear and moist. No oropharyngeal exudate.  Eyes: Conjunctivae and EOM are normal. Pupils are equal, round, and reactive to light. Right eye exhibits no discharge. Left eye exhibits no discharge. No scleral icterus.  Neck: Normal range of motion. Neck supple. No JVD present. No thyromegaly present.  Cardiovascular: Normal rate, regular rhythm, normal heart sounds and intact distal pulses.  Exam reveals no gallop and no friction rub.   No murmur heard. Pulmonary/Chest: Effort normal and breath sounds normal. No respiratory distress. He has no wheezes. He has no rales.  Abdominal: Soft. Bowel sounds are normal. He exhibits  no distension and no mass. There is no tenderness.  Musculoskeletal: Normal range of motion. He exhibits no edema and no  tenderness.  Lymphadenopathy:    He has no cervical adenopathy.  Neurological: He is alert. Coordination normal.  Skin: Skin is warm and dry. No rash noted. No erythema.  Psychiatric: He has a normal mood and affect. His behavior is normal.    ED Course  Procedures (including critical care time)  Labs Reviewed  APTT  CBC  COMPREHENSIVE METABOLIC PANEL  PROTIME-INR   Dg Chest Port 1 View  08/31/2012  *RADIOLOGY REPORT*  Clinical Data: Chest pain, shortness of breath  PORTABLE CHEST - 1 VIEW  Comparison: 09/15/2011  Findings: Cardiomegaly with slight progression from prior exam.  No acute infiltrate or pleural effusion.  No pulmonary edema.  IMPRESSION: Cardiomegaly.  No active disease.   Original Report Authenticated By: Natasha Mead, M.D.      1. STEMI (ST elevation myocardial infarction)       MDM  At this time the patient has an EKG which is consistent with left ventricular hypertrophy, he has no symptoms at this time but has a presentation is concerning for infarction and [facility] cardiology consultation. I patient's cardiologist, waiting return, vital signs with no tachycardia, there is no heart murmur, no fever and no cough or shortness of breath  ED ECG REPORT  I personally interpreted this EKG   Date: 08/31/2012   Rate: 63  Rhythm: normal sinus rhythm  QRS Axis: normal  Intervals: normal  ST/T Wave abnormalities: nonspecific T wave changes  Conduction Disutrbances:none  Narrative Interpretation: Ventricular hypertrophy with repolarization abnormalities  Old EKG Reviewed: Compared with prior EKG, no significant change   Dr. Riley Kill, the patient does meet criteria for ischemia at this time, cardiology to be involved.    3:00 PM, patient developed increased pain, this was transient but EKG performed during this time shows elevation in leads V2 and V3, this was discussed with [facility] on-call cardiologist Dr. Clifton James who will take pt to the Cath lab.  Heparin,  nitroglycerin, cardiac monitoring, 2 IVs, critical care being provided  CRITICAL CARE Performed by: Vida Roller   Total critical care time: 30  Critical care time was exclusive of separately billable procedures and treating other patients.  Critical care was necessary to treat or prevent imminent or life-threatening deterioration.  Critical care was time spent personally by me on the following activities: development of treatment plan with patient and/or surrogate as well as nursing, discussions with consultants, evaluation of patient's response to treatment, examination of patient, obtaining history from patient or surrogate, ordering and performing treatments and interventions, ordering and review of laboratory studies, ordering and review of radiographic studies, pulse oximetry and re-evaluation of patient's condition.       Vida Roller, MD 08/31/12 [phone]

## 2012-08-31 NOTE — ED Notes (Signed)
Pt presents to department for evaluation of midsternal chest pressure, sudden onset this afternoon upon exertion. Received x2 nitro, x3 (81mg ) aspirin, denies CP at the time. Pt is alert and oriented x4. Respirations unlabored. 20g LAC. Skin warm and dry.

## 2012-09-01 ENCOUNTER — Encounter (HOSPITAL_COMMUNITY): Payer: Self-pay | Admitting: Physician Assistant

## 2012-09-01 DIAGNOSIS — I379 Nonrheumatic pulmonary valve disorder, unspecified: Secondary | ICD-10-CM

## 2012-09-01 DIAGNOSIS — I252 Old myocardial infarction: Secondary | ICD-10-CM | POA: Diagnosis not present

## 2012-09-01 DIAGNOSIS — I214 Non-ST elevation (NSTEMI) myocardial infarction: Secondary | ICD-10-CM | POA: Diagnosis not present

## 2012-09-01 DIAGNOSIS — I219 Acute myocardial infarction, unspecified: Secondary | ICD-10-CM

## 2012-09-01 DIAGNOSIS — I2109 ST elevation (STEMI) myocardial infarction involving other coronary artery of anterior wall: Secondary | ICD-10-CM | POA: Diagnosis not present

## 2012-09-01 LAB — POCT I-STAT, CHEM 8
BUN: 17 mg/dL (ref 6–23)
Chloride: 108 mEq/L (ref 96–112)
HCT: 54 % — ABNORMAL HIGH (ref 39.0–52.0)
Sodium: 140 mEq/L (ref 135–145)
TCO2: 30 mmol/L (ref 0–100)

## 2012-09-01 LAB — CBC
HCT: 47 % (ref 39.0–52.0)
MCH: 28.4 pg (ref 26.0–34.0)
MCV: 82.3 fL (ref 78.0–100.0)
Platelets: 178 10*3/uL (ref 150–400)
RBC: 5.71 MIL/uL (ref 4.22–5.81)

## 2012-09-01 LAB — BASIC METABOLIC PANEL
BUN: 9 mg/dL (ref 6–23)
CO2: 26 mEq/L (ref 19–32)
Calcium: 9 mg/dL (ref 8.4–10.5)
Creatinine, Ser: 0.82 mg/dL (ref 0.50–1.35)

## 2012-09-01 LAB — LIPID PANEL
HDL: 48 mg/dL (ref >39)
LDL Cholesterol: 110 mg/dL — ABNORMAL HIGH (ref 0–99)
Total CHOL/HDL Ratio: 3.5 RATIO

## 2012-09-01 LAB — HEMOGLOBIN A1C: Hgb A1c MFr Bld: 8.8 % — ABNORMAL HIGH (ref <5.7)

## 2012-09-01 MED ORDER — POTASSIUM CHLORIDE CRYS ER 20 MEQ PO TBCR
40.0000 meq | EXTENDED_RELEASE_TABLET | Freq: Once | ORAL | Status: AC
  Administered 2012-09-01: 40 meq via ORAL
  Filled 2012-09-01: qty 2

## 2012-09-01 MED ORDER — POTASSIUM CHLORIDE CRYS ER 20 MEQ PO TBCR: 20.0000 meq | EXTENDED_RELEASE_TABLET | Freq: Every day | ORAL | Status: DC

## 2012-09-01 MED ORDER — NICOTINE 14 MG/24HR TD PT24
14.0000 mg | MEDICATED_PATCH | Freq: Every day | TRANSDERMAL | Status: DC
  Administered 2012-09-01: 14 mg via TRANSDERMAL
  Filled 2012-09-01 (×2): qty 1

## 2012-09-01 MED FILL — Dextrose Inj 5%: INTRAVENOUS | Qty: 50 | Status: AC

## 2012-09-01 NOTE — Progress Notes (Signed)
CARDIAC REHAB PHASE I   PRE:  Rate/Rhythm: 57 SB  BP:  Supine:   Sitting: 127/84  Standing:    SaO2: 99 RA  MODE:  Ambulation: 550 ft   POST:  Rate/Rhythm: 67 SR  BP:  Supine:   Sitting: 149/94  Standing:    SaO2: 99 RA 1500-1545 Pt tolerated ambulation well without c/o of cp or SOB. Gait steady. Pt back to recliner ater walk with call light in reach. Pt c/o of being hungry gave him snack. Gave pt MI and stent booklets. Will follow pt in am to do education. Melina Copa RN 09/01/2012 3:42 PM

## 2012-09-01 NOTE — Plan of Care (Signed)
Problem: Food- and Nutrition-Related Knowledge Deficit (NB-1.1) Goal: Nutrition education Formal process to instruct or train a patient/client in a skill or to impart knowledge to help patients/clients voluntarily manage or modify food choices and eating behavior to maintain or improve health. Outcome: Completed/Met Date Met:  09/01/12 Nutrition Education Note  RD consulted for nutrition education regarding a Heart Healthy and ? DM. Pt reports that he does not have diabetes. Dietary recall reveals 2 large meals daily, consisting of fried meats and regular sodas. Very limited fruits and vegetable intake. Sedentary lifestyle.  Lipid Panel     Component Value Date/Time    CHOL 170 09/01/2012 0516    TRIG 61 09/01/2012 0516    HDL 48 09/01/2012 0516    CHOLHDL 3.5 09/01/2012 0516    VLDL 12 09/01/2012 0516    LDLCALC 110* 09/01/2012 0516   Lab Results  Component Value Date    HGBA1C 8.8* 09/01/2012    RD provided "Heart Healthy Nutrition Therapy" handout from the [employer]. Reviewed patient's dietary recall. Provided examples on ways to decrease sodium and fat intake in diet. Discouraged intake of processed foods and use of salt shaker. Encouraged fresh fruits and vegetables as well as whole grain sources of carbohydrates to maximize fiber intake. Teach back method used.  RD provided "Carbohydrate Counting for People with Diabetes" handout from the Academy of [employer]. Discussed different food groups and their effects on blood sugar, emphasizing carbohydrate-containing foods. Provided list of carbohydrates and recommended serving sizes of common foods.  Discussed importance of controlled and consistent carbohydrate intake throughout the day. Provided examples of ways to balance meals/snacks and encouraged intake of high-fiber, whole grain complex carbohydrates. Teach back method used.  Expect fair compliance.  Body mass index is 47.08 kg/(m^2). Pt meets  criteria for Obese Class III based on current BMI.  Current diet order is Heart Healthy, patient is consuming approximately 50-100% of meals at this time. Labs and medications reviewed. No further nutrition interventions warranted at this time. RD contact information provided. If additional nutrition issues arise, please re-consult RD.  Jarold Motto MS, RD, LDN Pager: [phone] After-hours pager: [phone]

## 2012-09-01 NOTE — Progress Notes (Signed)
BP elevated to 186/87. Hydralazine 10 mg given prn. Two liters nasal canula placed on patient due to O2 desaturation while asleep. Pt. States that he wears Cpap at home sometimes.

## 2012-09-01 NOTE — Progress Notes (Signed)
Patient Name: [name] [name] Date of Encounter: 09/01/2012  Principal Problem:   ST elevation myocardial infarction (STEMI) of anterior wall, initial episode of care Active Problems:   DYSLIPIDEMIA   SMOKER   OBSTRUCTIVE SLEEP APNEA   Essential hypertension, benign   GASTROESOPHAGEAL REFLUX DISEASE    SUBJECTIVE: 57 year old male smoker with a history of CAD with multiple prior PCI, ischemic cardiomyopathy, HTN, HLD, and obesity who presented to the ED yesterday with chest pain, initially felt to be a NSTEMI, but with recurrent chest pain and EKG changes, called a STEMI and sent for urgent cath. PTCA/DES was placed in the mid LAD.   Today the patient reports fatigue and a mild HA. Had a few palpitations earlier this morning. Denies chest pain, SOB, edema.    OBJECTIVE Filed Vitals:   09/01/12 0400 09/01/12 0500 09/01/12 0600 09/01/12 0725  BP: 165/79 158/143 169/84   Pulse: 62 57 56   Temp: 97.6 F (36.4 C)   97.6 F (36.4 C)  TempSrc: Oral   Oral  Resp: 7 27 16    Height:      Weight:  347 lb 3.6 oz (157.5 kg)    SpO2: 96% 99% 100%     Intake/Output Summary (Last 24 hours) at 09/01/12 1610 Last data filed at 09/01/12 0600  Gross per 24 hour  Intake 1125.4 ml  Output   3400 ml  Net -2274.6 ml   Filed Weights   08/31/12 1440 09/01/12 0500  Weight: 350 lb 15.6 oz (159.2 kg) 347 lb 3.6 oz (157.5 kg)    PHYSICAL EXAM General: Well developed, well nourished, obese AA male in no acute distress. Head: Normocephalic, atraumatic.  Neck: Supple without bruits, JVD not elevated. Lungs:  Resp regular and unlabored, CTA. Heart: RRR, S1, S2, no S3, S4, or murmur; no rub. Abdomen: Soft, non-tender, non-distended, BS + x 4.  Extremities: No clubbing, cyanosis, edema. 2+ pulses in all 4 extremities.  Neuro: Alert and oriented X 3. Moves all extremities spontaneously. Psych: Normal affect.  LABS: CBC: Recent Labs  08/31/12 1433  08/31/12 1535 09/01/12 0516  WBC 8.1  --    --  7.5  HGB 17.4*  < > 17.7* 16.2  HCT 48.2  < > 52.0 47.0  MCV 81.0  --   --  82.3  PLT 198  --   --  178  < > = values in this interval not displayed. INR:  Recent Labs  08/31/12 1433  INR 0.86   Basic Metabolic Panel:  Recent Labs  96/04/54 1732 09/01/12 0516  NA 140 141  K 3.9 3.2*  CL 104 104  CO2 27 26  GLUCOSE 171* 196*  BUN 12 9  CREATININE 0.90 0.82  CALCIUM 9.5 9.0   Liver Function Tests:  Recent Labs  08/31/12 1433  AST 12  ALT <5  ALKPHOS 100  BILITOT 0.5  PROT 8.5*  ALBUMIN 3.8   Cardiac Enzymes:  Recent Labs  08/31/12 1732 08/31/12 2354 09/01/12 0516  TROPONINI 0.78* 2.70* 2.74*    Recent Labs  08/31/12 1516  TROPIPOC 0.00   BNP: Pro B Natriuretic peptide (BNP)  Date/Time Value Range Status  08/03/2011 12:35 AM 349.7* 0 - 125 pg/mL Final  02/08/2011 11:23 AM 74.0  0.0 - 100.0 pg/mL Final   Hemoglobin A1C: Pending  Fasting Lipid Panel:  Recent Labs  09/01/12 0516  CHOL 170  HDL 48  LDLCALC 110*  TRIG 61  CHOLHDL 3.5   TELE:  ECG: 01-Sep-2012 07:29:35  Normal sinus rhythm Biatrial enlargement Left ventricular hypertrophy Inferior infarct , age undetermined T wave abnormality, consider anterolateral ischemia Abnormal ECG Vent. rate 67 BPM PR interval 150 ms QRS duration 112 ms QT/QTc 460/486 ms P-R-T axes 62 3 153  Radiology/Studies: Dg Chest Port 1 View 08/31/2012  *RADIOLOGY REPORT*  Clinical Data: Chest pain, shortness of breath  PORTABLE CHEST - 1 VIEW  Comparison: 09/15/2011  Findings: Cardiomegaly with slight progression from prior exam.  No acute infiltrate or pleural effusion.  No pulmonary edema.  IMPRESSION: Cardiomegaly.  No active disease.   Original Report Authenticated By: Natasha Mead, M.D.     Cardiac Cath: 08/31/12 Impression:  1. NSTEMI with severe stenosis mid LAD  2. Diffuse moderate disease in the RCA and Circumflex  3. Segmental LV systolic dysfunction  4. Successful PTCA/DES x 1 mid  LAD   Recommendations: He will need continuation of dual anti-platelet therapy with ASA and Plavix for at least one year. Continue statin, beta blocker, ARB. Will arrange Echo in am. Medical management of disease in Circumflex and RCA for now.     Current Medications:  . amLODipine  10 mg Oral Daily  . aspirin EC  81 mg Oral q morning - 10a  . atorvastatin  80 mg Oral q1800  . carvedilol  25 mg Oral BID WC  . citalopram  40 mg Oral QHS  . cloNIDine  0.2 mg Oral BID  . clopidogrel  75 mg Oral q morning - 10a  . famotidine  40 mg Oral Q12H  . furosemide  40 mg Oral q morning - 10a  . losartan  100 mg Oral Daily  . potassium chloride SA  20 mEq Oral Daily   . nitroGLYCERIN 10 mcg/min (08/31/12 1936)    ASSESSMENT AND PLAN:  1. ST elevation myocardial infarction (STEMI) of anterior wall - severe mid LAD stenosis, s/p PTCA/DES x1 mid LAD, currently stable without chest pain, troponin still trending up, plan to transfer - tele and continue observation, continuation of dual antiplatelet therapy with ASA and plavix   2. LV systolic dysfunction - echo scheduled for today  3. Hypertension - BP elevated since admission (systolic running [id number] today), carvedilol and hydralazine given this AM, restart amlodipine and losartan for better BP control, consider decreasing lasix  4. Dyslipidemia - elevated LDL, continue statin, will order nutrition consult  5. Hypokalemia - supplement PO qd, decrease lasix  6. Hyperglycemia - no hx of DM, check HA1C  7. Tobacco abuse - discussed with patient who is aware of risks given hx multiple comorbidities and he is interested in quitting, states that he is currently smoking <1ppd and trying to cut down, will give nicotine patch    Active Problems:   DYSLIPIDEMIA   SMOKER   OBSTRUCTIVE SLEEP APNEA   Essential hypertension, benign   GASTROESOPHAGEAL REFLUX DISEASE   Signed, Tandy Gaw 09/01/2012 9:20 AM  Theodore Demark ,  PA-C 8:12 AM 09/01/2012  Patient seen, examined. Available data reviewed. Agree with findings, assessment, and plan as outlined by Theodore Demark, PA-C. The patient is stable and chest pain-free after emergent PCI of the LAD. Exam reveals alert, oriented male in NAD. Lungs clear and heart RRR with 2/6 systolic murmur at the RUSB. No peripheral edema. Right groin site is clear. Agree with reinstitution of oral antihypertensive meds. Tobacco cessation counseling done.  Tonny Bollman, M.D. 09/01/2012 10:12 AM

## 2012-09-01 NOTE — Progress Notes (Signed)
Echocardiogram 2D Echocardiogram has been performed.  [name] [name] 09/01/2012, 12:32 PM

## 2012-09-02 ENCOUNTER — Encounter (HOSPITAL_COMMUNITY): Payer: Self-pay | Admitting: Nurse Practitioner

## 2012-09-02 DIAGNOSIS — I255 Ischemic cardiomyopathy: Secondary | ICD-10-CM | POA: Diagnosis present

## 2012-09-02 DIAGNOSIS — Z87891 Personal history of nicotine dependence: Secondary | ICD-10-CM

## 2012-09-02 DIAGNOSIS — Z720 Tobacco use: Secondary | ICD-10-CM

## 2012-09-02 DIAGNOSIS — E119 Type 2 diabetes mellitus without complications: Secondary | ICD-10-CM | POA: Diagnosis present

## 2012-09-02 DIAGNOSIS — I2109 ST elevation (STEMI) myocardial infarction involving other coronary artery of anterior wall: Principal | ICD-10-CM

## 2012-09-02 LAB — BASIC METABOLIC PANEL
BUN: 17 mg/dL (ref 6–23)
Calcium: 9.3 mg/dL (ref 8.4–10.5)
GFR calc Af Amer: 86 mL/min — ABNORMAL LOW (ref >90)
GFR calc non Af Amer: 74 mL/min — ABNORMAL LOW (ref >90)
Glucose, Bld: 212 mg/dL — ABNORMAL HIGH (ref 70–99)
Sodium: 141 mEq/L (ref 135–145)

## 2012-09-02 MED ORDER — AMLODIPINE BESYLATE 10 MG PO TABS: 10.0000 mg | ORAL_TABLET | ORAL | Status: DC

## 2012-09-02 MED ORDER — NICOTINE 14 MG/24HR TD PT24: MEDICATED_PATCH | TRANSDERMAL | Status: DC

## 2012-09-02 MED ORDER — LIVING WELL WITH DIABETES BOOK
Freq: Once | Status: DC
  Filled 2012-09-02: qty 1

## 2012-09-02 MED ORDER — CLOPIDOGREL BISULFATE 75 MG PO TABS: 75.0000 mg | ORAL_TABLET | Freq: Every morning | ORAL | Status: DC

## 2012-09-02 MED ORDER — METFORMIN HCL 500 MG PO TABS: 500.0000 mg | ORAL_TABLET | Freq: Every day | ORAL | Status: DC

## 2012-09-02 MED ORDER — NITROGLYCERIN 0.4 MG SL SUBL: 0.4000 mg | SUBLINGUAL_TABLET | SUBLINGUAL | Status: DC | PRN

## 2012-09-02 NOTE — Progress Notes (Signed)
[id number] Cardiac Rehab Completed MI and stent education with pt. He voices understanding. Discussed smoking cessation with pt. He states that he wants to quit and feels that he has never tried very hard to do it. Gave him tips for quitting and coaching contact number. Pt agrees to [facility]pt. [facility] in [place], will send referral. Pt knows that he has a lot of changes to makes to have a better lifestyle and to reduce his risk factors for heart disease. Also discussed with pt his residual heart disease that medical treatment is planned for.

## 2012-09-02 NOTE — Discharge Summary (Signed)
Patient ID: [name] [name],  MRN: [id number], DOB/AGE: 1956-01-25 57 y.o.  Admit date: 08/31/2012 Discharge date: 09/02/2012  Primary Care Provider: No primary provider on file. Primary Cardiologist: M. [name], MD  Discharge Diagnoses Principal Problem:   ST elevation myocardial infarction (STEMI) of anterior wall, initial episode of care  **s/p PCI/DES of the LAD this admission. Active Problems:   CAD (coronary artery disease)   Diabetes mellitus  **New Dx - Metformin initiated this admission.   DYSLIPIDEMIA   Essential hypertension, benign   Tobacco abuse   OBSTRUCTIVE SLEEP APNEA   GASTROESOPHAGEAL REFLUX DISEASE   Ischemic cardiomyopathy  **Nl LV fxn by echo this admission.  Allergies Allergies  Allergen Reactions  . Penicillins Other (See Comments)    Unknown childhood reaction   Procedures  Cardiac Catheterization and Percutaneous Coronary Intervention 08/31/2012  Procedure Details:  Hemodynamic Findings: Central aortic pressure: 179/98 Left ventricular pressure: 177/10/18  Angiographic Findings:  Left main:  No obstructive disease noted.   Left Anterior Descending Artery: Large caliber vessel that courses to the apex. The proximal vessel has no significant disease. The mid vessel has a hazy 95-99% stenosis just before a large caliber diagonal branch. The distal vessel has diffuse disease with serial 95% stenoses in the very distal LAD/apical segment (1.0-1.5 mm). The diagonal branch is large in caliber with a 50% mid stenosis.     **The LAD was successfully stented using a 3.0 x 20 mm Promus Premier DES **  Circumflex Artery: Large caliber vessel with 30% diffuse proximal stenosis. There are several very small caliber obtuse marginal branches which are sub-totally occluded and then 2 moderate caliber posterolateral branches. The first posterolateral branch has a patent stent in the mid vessel. Just prior to that stent is a 70-80% stenosis that does not appear to be  flow limiting. The second posterolateral branch has mild plaque. The continuation of the AV groove Circumflex beyond the last PL branch becomes small in caliber and has a focal 95% stenosis.   Right Coronary Artery: Large caliber co-dominant vessel diffuse 30% proximal stenosis. The entire mid segment is stented. The proximal portion of the mid stented segment has 40% restenosis and the more distal portion of the stented segment has 70% restenosis. The distal vessel gives of an acute marginal branch and a PDA branch. Neither of these branches has significant obstructive disease.   Left Ventricular Angiogram: LVEF 35-40% with hypokinesis of the inferior base. The apex is not well visualized.  _____________  2D Echocardiogram 09/01/2012  Study Conclusions  - Left ventricle: Inferobasal wall hypokinesis The cavity   size was normal. Wall thickness was increased in a pattern   of moderate LVH. Systolic function was normal. The   estimated ejection fraction was in the range of 50% to   55%. - Left atrium: The atrium was mildly dilated. - Atrial septum: No defect or patent foramen ovale was   identified. _____________  History of Present Illness  57 year old male with prior history of coronary artery disease status post multiple prior interventions. He was in his usual state of health until the morning of admission when he had sudden onset of chest discomfort similar to prior angina. EMS was called and he was found to have anterior ST segment elevation and a code STEMI was activated. ST elevation subsequently resolved but then returned and patient was taken to the [facility] cardiac catheterization laboratory for emergent diagnostic catheterization.  Hospital Course  Patient underwent emergent cardiac catheterization which revealed severe mid  LAD stenosis as well as moderate disease involving the left posterior lateral branch the left circumflex. The LAD was felt to be the culprit vessel and this  was successfully stented using a 3.0 x 20 mm Promus Premier drug-eluting stent. Patient tolerated procedure well and post procedure is monitored in the coronary intensive care unit where he eventually peaked his troponin at 2.74. He was maintained on aspirin, Plavix, statin, beta blocker, and ARB therapy and had no recurrence of chest discomfort. Followup 2-D echocardiogram was performed on March 24 and showed an EF of 50-55%. Patient was transferred out to the floor and has been ambulating with cardiac rehabilitation without further symptoms. He has been counseled on the importance of smoking cessation.  Throughout admission, patient was noted to have elevated a.m. glucoses. HemoglobinA1c was evaluated and found to be elevated at 8.8. We are initiating metformin therapy at discharge and have recommended early followup with primary care for further titration and management of newly diagnosed diabetes mellitus.  Discharge Vitals Blood pressure 128/64, pulse 56, temperature 98.6 F (37 C), temperature source Oral, resp. rate 18, height 6' (1.829 m), weight 348 lb 3.2 oz (157.942 kg), SpO2 92.00%.  Filed Weights   08/31/12 1440 09/01/12 0500 09/02/12 0625  Weight: 350 lb 15.6 oz (159.2 kg) 347 lb 3.6 oz (157.5 kg) 348 lb 3.2 oz (157.942 kg)   Labs  CBC  Recent Labs  08/31/12 1433  08/31/12 1535 09/01/12 0516  WBC 8.1  --   --  7.5  HGB 17.4*  < > 17.7* 16.2  HCT 48.2  < > 52.0 47.0  MCV 81.0  --   --  82.3  PLT 198  --   --  178  < > = values in this interval not displayed. Basic Metabolic Panel  Recent Labs  09/01/12 0516 09/02/12 0806  NA 141 141  K 3.2* 3.5  CL 104 104  CO2 26 26  GLUCOSE 196* 212*  BUN 9 17  CREATININE 0.82 1.10  CALCIUM 9.0 9.3   Liver Function Tests  Recent Labs  08/31/12 1433  AST 12  ALT <5  ALKPHOS 100  BILITOT 0.5  PROT 8.5*  ALBUMIN 3.8   Cardiac Enzymes  Recent Labs  08/31/12 1732 08/31/12 2354 09/01/12 0516  TROPONINI 0.78* 2.70*  2.74*   Hemoglobin A1C  Recent Labs  09/01/12 0830  HGBA1C 8.8*   Fasting Lipid Panel  Recent Labs  09/01/12 0516  CHOL 170  HDL 48  LDLCALC 110*  TRIG 61  CHOLHDL 3.5   Disposition  Pt is being discharged home today in good condition.  Follow-up Plans & Appointments  Follow-up Information   Follow up with Jacolyn Reedy, PA-C On 09/10/2012. (8:45 AM)    Contact information:   [address] [place] [address] [phone]     Discharge Medications    Medication List    STOP taking these medications       benazepril 40 MG tablet  Commonly known as:  LOTENSIN      TAKE these medications       amLODipine 10 MG tablet  Commonly known as:  NORVASC  Take 1 tablet (10 mg total) by mouth every morning.     aspirin EC 81 MG tablet  Take 81 mg by mouth every morning.     carvedilol 25 MG tablet  Commonly known as:  COREG  Take 25 mg by mouth 2 (two) times daily with a meal.  citalopram 40 MG tablet  Commonly known as:  CELEXA  Take 40 mg by mouth at bedtime.     cloNIDine 0.2 MG tablet  Commonly known as:  CATAPRES  Take 0.2 mg by mouth 2 (two) times daily.     clopidogrel 75 MG tablet  Commonly known as:  PLAVIX  Take 75 mg by mouth every morning.     famotidine 40 MG tablet  Commonly known as:  PEPCID  Take 40 mg by mouth every 12 (twelve) hours.     furosemide 40 MG tablet  Commonly known as:  LASIX  Take 40 mg by mouth every morning.     losartan 100 MG tablet  Commonly known as:  COZAAR  Take 100 mg by mouth daily.     metFORMIN 500 MG tablet  Commonly known as:  GLUCOPHAGE  Take 1 tablet (500 mg total) by mouth daily with breakfast.     nicotine 14 mg/24hr patch  Commonly known as:  NICODERM CQ - dosed in mg/24 hours  Use 14mg  patch daily x 6 wks and then reduce to 7mg  patch for 2-4 wks.     nitroGLYCERIN 0.4 MG SL tablet  Commonly known as:  NITROSTAT  Place 0.4 mg under the tongue every 5 (five) minutes as  needed. For chest pain     potassium chloride SA 20 MEQ tablet  Commonly known as:  K-DUR,KLOR-CON  Take 20 mEq by mouth daily.     pravastatin 80 MG tablet  Commonly known as:  PRAVACHOL  Take 80 mg by mouth daily.      Outstanding Labs/Studies  None  Duration of Discharge Encounter   Greater than 30 minutes including physician time.  Signed, Nicolasa Ducking NP 09/02/2012, 12:22 PM

## 2012-09-02 NOTE — Progress Notes (Signed)
    Subjective:  Feels good. No CP or dyspnea. No c/o this am.  Objective:  Vital Signs in the last 24 hours: Temp:  [97.5 F (36.4 C)-98.6 F (37 C)] 98.6 F (37 C) (03/25 0625) Pulse Rate:  [56-70] 56 (03/25 0625) Resp:  [18] 18 (03/25 0625) BP: (128-168)/(64-85) 128/64 mmHg (03/25 0625) SpO2:  [92 %-100 %] 92 % (03/25 0625) Weight:  [157.942 kg (348 lb 3.2 oz)] 157.942 kg (348 lb 3.2 oz) (03/25 0625)  Intake/Output from previous day: 03/24 0701 - 03/25 0700 In: 355 [P.O.:355] Out: 300 [Urine:300]  Physical Exam: Pt is alert and oriented, NAD HEENT: normal Neck: JVP - normal Lungs: CTA bilaterally CV: RRR without murmur or gallop Abd: soft, NT, Positive BS, no hepatomegaly Ext: no C/C/E, distal pulses intact and equal, right groin site clear (dressing removed) Skin: warm/dry no rash  Lab Results:  Recent Labs  08/31/12 1433  08/31/12 1535 09/01/12 0516  WBC 8.1  --   --  7.5  HGB 17.4*  < > 17.7* 16.2  PLT 198  --   --  178  < > = values in this interval not displayed.  Recent Labs  08/31/12 1732 09/01/12 0516  NA 140 141  K 3.9 3.2*  CL 104 104  CO2 27 26  GLUCOSE 171* 196*  BUN 12 9  CREATININE 0.90 0.82    Recent Labs  08/31/12 2354 09/01/12 0516  TROPONINI 2.70* 2.74*    Cardiac Studies: 2D Echo: Left ventricle: Inferobasal wall hypokinesis The cavity size was normal. Wall thickness was increased in a pattern of moderate LVH. Systolic function was normal. The estimated ejection fraction was in the range of 50% to 55%.  ------------------------------------------------------------ Aortic valve: Mildly calcified leaflets.  ------------------------------------------------------------ Mitral valve: Doppler: Trivial regurgitation. Peak gradient: 3mm Hg (D).  ------------------------------------------------------------ Left atrium: The atrium was mildly dilated.  ------------------------------------------------------------ Atrial  septum: No defect or patent foramen ovale was identified.  ------------------------------------------------------------ Right ventricle: The cavity size was normal. Wall thickness was normal. Systolic function was normal.  ------------------------------------------------------------ Pulmonic valve: Doppler: Mild regurgitation.  ------------------------------------------------------------ Tricuspid valve: Doppler: Mild regurgitation.  ------------------------------------------------------------ Right atrium: The atrium was normal in size.  ------------------------------------------------------------ Pericardium: The pericardium was normal in appearance.  Tele: Sinus rhythm, sinus brady, no arrhythmia noted.  Assessment/Plan:  1. NSTEMI - s/p PCI of the LAD (drug-eluting stent) 2. Diffuse moderate CAD - med management unless refractory anginal symptoms 3. Tobacco abuse - cessation counseling done 4. HTN, malignant 5. Hypokalemia  Pt doing well. Preserved LV function is good prognostic sign. Continue current Rx for HTN. Stressed importance of med adherence, especially with DAPT. Repeat BMET this am to f/u on hypokalemia. Stable for discharge home today. Arrange f/u with Tereso Newcomer next week.  Tonny Bollman, M.D. 09/02/2012, 7:45 AM

## 2012-09-03 ENCOUNTER — Telehealth: Payer: Self-pay | Admitting: *Deleted

## 2012-09-03 NOTE — Telephone Encounter (Signed)
Attempted to call pt x 2 pt is not receiving messages at this time.

## 2012-09-04 ENCOUNTER — Other Ambulatory Visit: Payer: Self-pay | Admitting: Cardiovascular Disease

## 2012-09-04 NOTE — Telephone Encounter (Signed)
TCM; attempted to call; patient's phone unavailable.  Attempted patient's emergency contact # and person who answered stated wrong number

## 2012-09-05 ENCOUNTER — Telehealth: Payer: Self-pay | Admitting: *Deleted

## 2012-09-05 MED ORDER — CLONIDINE HCL 0.2 MG PO TABS: 0.2000 mg | ORAL_TABLET | Freq: Two times a day (BID) | ORAL | Status: DC

## 2012-09-05 NOTE — Telephone Encounter (Signed)
**  TCM**; Attempted to call patient again.  Phone is not in service.

## 2012-09-05 NOTE — Telephone Encounter (Signed)
Patient calling refill line to refill Clonidine 0.2mg  twice daily   #60, 3RF   Micki Riley, CMA

## 2012-09-10 ENCOUNTER — Encounter: Payer: Self-pay | Admitting: Physician Assistant

## 2012-09-10 ENCOUNTER — Ambulatory Visit (INDEPENDENT_AMBULATORY_CARE_PROVIDER_SITE_OTHER): Payer: Medicare HMO | Admitting: Physician Assistant

## 2012-09-10 VITALS — BP 134/76 | HR 58 | Ht 74.0 in | Wt 346.0 lb

## 2012-09-10 DIAGNOSIS — F17200 Nicotine dependence, unspecified, uncomplicated: Secondary | ICD-10-CM

## 2012-09-10 DIAGNOSIS — I2510 Atherosclerotic heart disease of native coronary artery without angina pectoris: Secondary | ICD-10-CM

## 2012-09-10 DIAGNOSIS — E119 Type 2 diabetes mellitus without complications: Secondary | ICD-10-CM

## 2012-09-10 DIAGNOSIS — Z720 Tobacco use: Secondary | ICD-10-CM

## 2012-09-10 DIAGNOSIS — I10 Essential (primary) hypertension: Secondary | ICD-10-CM

## 2012-09-10 MED ORDER — NICOTINE 7 MG/24HR TD PT24: 1.0000 | MEDICATED_PATCH | TRANSDERMAL | Status: DC

## 2012-09-10 NOTE — Patient Instructions (Addendum)
**Note De-Identified  Obfuscation** Your physician has recommended you make the following change in your medication: Start using Nicoderm 7 mg patch daily for 6 weeks  Your physician discussed the hazards of tobacco use. Tobacco use cessation is recommended and techniques and options to help you quit were discussed.   Your physician recommends that you schedule a follow-up appointment in: 2 months

## 2012-09-10 NOTE — Assessment & Plan Note (Signed)
Patient had stem he treated with drug-eluting stent to the LAD. He has residual coronary did artery disease as listed above. The patient does have chest pain when he smokes a cigarette. I stressed the importance of smoking cessation. He is still smoking close to half a pack of cigarettes a day. He never got the nicoderm patches that were ordered from the hospital. We will reorder and hope that he can quit smoking. He will follow up with Dr. [name] in one to 2 months.

## 2012-09-10 NOTE — Progress Notes (Signed)
HPI:  This is a 57 year old male patient recently suffered a STEMI of the anterior wall treated with PE CI/DES of the LAD. He had normal LV function by 2-D echo that admission. EF by cardiac catheterization was 35-40% with hypokinesis of the inferior base. EF by 2-D echo was 50-55% with hypokinesis of the inferior basal wall. He has residual CAD, see below for complete cardiac catheterization details. The patient has history of PCI to the RCA and posterior lateral one with a cutting balloon angioplasty secondary to in-stent restenosis of the RCA in 5/08.  The patient states he has chest pressure every time he smokes a cigarette. He has tried walking and denies chest pain with exertion. He is trying to follow low-fat diet. He was also diagnosed with diabetes mellitus and is scheduled to see a doctor for this. Currently he has no meter to check his sugars.   Allergies:  -- Penicillins -- Other (See Comments)   --  Unknown childhood reaction  Current Outpatient Prescriptions on File Prior to Visit: amLODipine (NORVASC) 10 MG tablet, Take 1 tablet (10 mg total) by mouth every morning., Disp: 30 tablet, Rfl: 6 aspirin EC 81 MG tablet, Take 81 mg by mouth every morning. , Disp: , Rfl:  carvedilol (COREG) 25 MG tablet, Take 25 mg by mouth 2 (two) times daily with a meal. , Disp: , Rfl:  citalopram (CELEXA) 40 MG tablet, Take 40 mg by mouth at bedtime. , Disp: , Rfl:  cloNIDine (CATAPRES) 0.2 MG tablet, TAKE 1 TABLET BY MOUTH TWICE DAILY, Disp: 60 tablet, Rfl: 0 cloNIDine (CATAPRES) 0.2 MG tablet, Take 1 tablet (0.2 mg total) by mouth 2 (two) times daily., Disp: 60 tablet, Rfl: 3 clopidogrel (PLAVIX) 75 MG tablet, Take 1 tablet (75 mg total) by mouth every morning., Disp: 30 tablet, Rfl: 6 famotidine (PEPCID) 40 MG tablet, Take 40 mg by mouth every 12 (twelve) hours. , Disp: , Rfl:  furosemide (LASIX) 40 MG tablet, Take 40 mg by mouth every morning. , Disp: , Rfl:  losartan (COZAAR) 100 MG tablet, Take  100 mg by mouth daily. , Disp: , Rfl:   metFORMIN (GLUCOPHAGE) 500 MG tablet, Take 1 tablet (500 mg total) by mouth daily with breakfast., Disp: 30 tablet, Rfl: 2 nicotine (NICODERM CQ - DOSED IN MG/24 HOURS) 14 mg/24hr patch, Use 14mg  patch daily x 6 wks and then reduce to 7mg  patch for 2-4 wks., Disp: 28 patch, Rfl:  nitroGLYCERIN (NITROSTAT) 0.4 MG SL tablet, Place 1 tablet (0.4 mg total) under the tongue every 5 (five) minutes as needed. For chest pain, Disp: 25 tablet, Rfl: 3 potassium chloride SA (K-DUR,KLOR-CON) 20 MEQ tablet, Take 20 mEq by mouth daily. , Disp: , Rfl:  pravastatin (PRAVACHOL) 80 MG tablet, Take 80 mg by mouth daily., Disp: , Rfl:   No current facility-administered medications on file prior to visit.   Past Medical History:   Dyslipidemia                                                 HTN (hypertension)                                           CAD (coronary artery disease)  Comment:a. s/p PCI/BMS pRCA 2002; b. PCI pRCA & DES p/m              RCA 2006; c. PCI/DES OM4 2007; d. PCI/CBA to               RCA for ISR 10/2006; e.08/2012 STEMI/Cath/PCI: LM              nl, LAD95-46m (3.0x20 Promus Prem DES), 95/95               ap LAD (1.0-1.21mm), LCX 30p 95d (sm), LPL1               70-80p, patent stent, LPL2 nl, RCA 30p, 40/70               ISR, EF 35-40%.   Ischemic cardiomyopathy                                        Comment:a. EF 40%; improved to normal;  b. 08/2012 Echo:              EF 50-55%, mod LVH.   GERD (gastroesophageal reflux disease)                       Tobacco abuse                                                Obesity                                                      OSA (obstructive sleep apnea)                                  Comment:Does not use CPAP as of 05/2011   Depression                                                   History of pneumonia                                         Diabetes mellitus                                               Comment:a. A1c 8.8 08/2012->Metformin initiated.  Past Surgical History:   CORONARY STENT PLACEMENT                         2009        Review of patient's family history indicates:   Coronary artery disease  Depression                     Mother                   Cancer                         Mother                     Comment: ovarian   Hypertension                   Mother                   Other                          Father                     Comment: motor vehicle accident   Social History   Marital Status: Single              Spouse Name:                      Years of Education:                 Number of children:             Occupational History Occupation          Associate Professor            Comment              retired                                   Chief Executive Officer History Main Topics   Smoking Status: Current Every Day Smoker        Packs/Day: 0.75  Years: 30        Types: Cigarettes   Smokeless Status: Never Used                       Alcohol Use: No             Drug Use: No             Sexual Activity: Not on file        Other Topics            Concern   None on file  Social History Narrative   None on file    ROS:see history of present illness   PHYSICAL EXAM: Obese, in no acute distress. Neck: No JVD, HJR, Bruit, or thyroid enlargement  Lungs: No tachypnea, clear without wheezing, rales, or rhonchi  Cardiovascular: RRR, PMI not displaced, heart sounds normal, no murmurs, gallops, bruit, thrill, or heave.  Abdomen: BS normal. Soft without organomegaly, masses, lesions or tenderness.  Extremities: right or hematoma or hemorrhage, lower extremities without cyanosis, clubbing or edema. Good distal pulses bilateral  SKin: Warm, no lesions or rashes   Musculoskeletal: No deformities  Neuro: no focal signs  BP 134/76  Pulse 58  Ht 6\' 2"  (1.88 m)  Wt 346 lb (156.945 kg)  BMI 44.4  kg/m2    HYQ:MVHQI bradycardia 58 beats per minute with LVH, inferior Q waves, nonspecific ST-T wave changes  laterally  Cardiac Catheterization and Percutaneous Coronary Intervention 08/31/2012  Procedure Details:  Hemodynamic Findings: Central aortic pressure: 179/98 Left ventricular pressure: 177/10/18  Angiographic Findings:  Left main:  No obstructive disease noted.   Left Anterior Descending Artery: Large caliber vessel that courses to the apex. The proximal vessel has no significant disease. The mid vessel has a hazy 95-99% stenosis just before a large caliber diagonal branch. The distal vessel has diffuse disease with serial 95% stenoses in the very distal LAD/apical segment (1.0-1.5 mm). The diagonal branch is large in caliber with a 50% mid stenosis.                **The LAD was successfully stented using a 3.0 x 20 mm Promus Premier DES **  Circumflex Artery: Large caliber vessel with 30% diffuse proximal stenosis. There are several very small caliber obtuse marginal branches which are sub-totally occluded and then 2 moderate caliber posterolateral branches. The first posterolateral branch has a patent stent in the mid vessel. Just prior to that stent is a 70-80% stenosis that does not appear to be flow limiting. The second posterolateral branch has mild plaque. The continuation of the AV groove Circumflex beyond the last PL branch becomes small in caliber and has a focal 95% stenosis.   Right Coronary Artery: Large caliber co-dominant vessel diffuse 30% proximal stenosis. The entire mid segment is stented. The proximal portion of the mid stented segment has 40% restenosis and the more distal portion of the stented segment has 70% restenosis. The distal vessel gives of an acute marginal branch and a PDA branch. Neither of these branches has significant obstructive disease.   Left Ventricular Angiogram: LVEF 35-40% with hypokinesis of the inferior base. The apex is not well  visualized.  _____________  2D Echocardiogram 09/01/2012  Study Conclusions  - Left ventricle: Inferobasal wall hypokinesis The cavity   size was normal. Wall thickness was increased in a pattern   of moderate LVH. Systolic function was normal. The   estimated ejection fraction was in the range of 50% to   55%. - Left atrium: The atrium was mildly dilated. - Atrial septum: No defect or patent foramen ovale was   identified. _____________

## 2012-09-10 NOTE — Assessment & Plan Note (Signed)
New diagnosis. Patient to see MD for this and get a meter to check his sugars.

## 2012-09-10 NOTE — Assessment & Plan Note (Signed)
stable °

## 2012-09-10 NOTE — Assessment & Plan Note (Addendum)
Weight loss is essential in this patient to help control his diabetes. Have given him a low fat diet to follow. Portion control discussed.Scale ordered

## 2012-09-10 NOTE — Assessment & Plan Note (Signed)
Patient having chest pain every time he smokes a cigarette. Discussed smoking cessation in detail. Have prescribed Nicoderm patches.

## 2012-09-18 ENCOUNTER — Encounter: Payer: Medicare HMO | Attending: Nurse Practitioner

## 2012-09-26 ENCOUNTER — Other Ambulatory Visit: Payer: Self-pay | Admitting: Cardiovascular Disease

## 2012-10-16 ENCOUNTER — Other Ambulatory Visit: Payer: Self-pay | Admitting: *Deleted

## 2012-10-16 MED ORDER — FAMOTIDINE 40 MG PO TABS: 40.0000 mg | ORAL_TABLET | Freq: Two times a day (BID) | ORAL | Status: DC

## 2012-10-28 ENCOUNTER — Other Ambulatory Visit (HOSPITAL_COMMUNITY): Payer: Self-pay | Admitting: Nurse Practitioner

## 2012-11-12 ENCOUNTER — Encounter: Payer: Self-pay | Admitting: Cardiovascular Disease

## 2012-11-12 ENCOUNTER — Ambulatory Visit (INDEPENDENT_AMBULATORY_CARE_PROVIDER_SITE_OTHER): Payer: Medicare HMO | Admitting: Cardiovascular Disease

## 2012-11-12 VITALS — BP 142/86 | HR 64 | Ht 75.0 in | Wt 330.2 lb

## 2012-11-12 DIAGNOSIS — I2510 Atherosclerotic heart disease of native coronary artery without angina pectoris: Secondary | ICD-10-CM

## 2012-11-12 DIAGNOSIS — I10 Essential (primary) hypertension: Secondary | ICD-10-CM

## 2012-11-12 DIAGNOSIS — E119 Type 2 diabetes mellitus without complications: Secondary | ICD-10-CM

## 2012-11-12 NOTE — Progress Notes (Signed)
HPI:  57 year old gentleman presenting for followup evaluation. He has coronary artery disease with history of right coronary artery stenting followed by cutting balloon angioplasty for in-stent restenosis in 2008. He was treated medically until earlier this year when he presented with an anterior wall MI and he was treated with primary PCI using a drug-eluting stent in the LAD. His post MI left ventricular ejection fraction was estimated at 50-55% by echo. Medical therapy was recommended for his residual coronary disease.  The patient denies chest pain or pressure. He gets tired with activity. He denies orthopnea, PND, or leg swelling. He admits to exertional dyspnea. He continues to smoke cigarettes and states it is down to 8 cigarettes per day.  Outpatient Encounter Prescriptions as of 11/12/2012  Medication Sig Dispense Refill  . amLODipine (NORVASC) 10 MG tablet Take 1 tablet (10 mg total) by mouth every morning.  30 tablet  6  . aspirin EC 81 MG tablet Take 81 mg by mouth every morning.       . carvedilol (COREG) 25 MG tablet Take 25 mg by mouth 2 (two) times daily with a meal.       . citalopram (CELEXA) 40 MG tablet Take 40 mg by mouth at bedtime.       . cloNIDine (CATAPRES) 0.2 MG tablet TAKE 1 TABLET BY MOUTH TWICE DAILY  60 tablet  0  . clopidogrel (PLAVIX) 75 MG tablet Take 1 tablet (75 mg total) by mouth every morning.  30 tablet  6  . famotidine (PEPCID) 40 MG tablet Take 1 tablet (40 mg total) by mouth every 12 (twelve) hours.  30 tablet  6  . furosemide (LASIX) 40 MG tablet Take 40 mg by mouth every morning.       [name] losartan (COZAAR) 100 MG tablet Take 100 mg by mouth daily.       . metFORMIN (GLUCOPHAGE) 500 MG tablet Take 1 tablet (500 mg total) by mouth daily with breakfast.  30 tablet  2  . nitroGLYCERIN (NITROSTAT) 0.4 MG SL tablet Place 1 tablet (0.4 mg total) under the tongue every 5 (five) minutes as needed. For chest pain  25 tablet  3  . potassium chloride SA  (K-DUR,KLOR-CON) 20 MEQ tablet Take 20 mEq by mouth daily.       . pravastatin (PRAVACHOL) 80 MG tablet Take 80 mg by mouth daily.      . [DISCONTINUED] nicotine (NICODERM CQ) 7 mg/24hr patch Place 1 patch onto the skin daily.  28 patch  1  . [DISCONTINUED] pravastatin (PRAVACHOL) 80 MG tablet TAKE 1 TABLET BY MOUTH DAILY  30 tablet  6   No facility-administered encounter medications on file as of 11/12/2012.    Allergies  Allergen Reactions  . Penicillins Other (See Comments)    Unknown childhood reaction    Past Medical History  Diagnosis Date  . Dyslipidemia   . HTN (hypertension)   . CAD (coronary artery disease)     a. s/p PCI/BMS pRCA 2002; b. PCI pRCA & DES p/m RCA 2006; c. PCI/DES OM4 2007; d. PCI/CBA to RCA for ISR 10/2006; e.08/2012 STEMI/Cath/PCI: LM nl, LAD95-51m (3.0x20 Promus Prem DES), 95/95 ap LAD (1.0-1.3mm), LCX 30p 95d (sm), LPL1 70-80p, patent stent, LPL2 nl, RCA 30p, 40/70 ISR, EF 35-40%.  . Ischemic cardiomyopathy     a. EF 40%; improved to normal;  b. 08/2012 Echo: EF 50-55%, mod LVH.  [name] GERD (gastroesophageal reflux disease)   . Tobacco abuse   .  Obesity   . OSA (obstructive sleep apnea)     Does not use CPAP as of 05/2011  . Depression   . History of pneumonia   . Diabetes mellitus     a. A1c 8.8 08/2012->Metformin initiated.    ROS: Negative except as per HPI  BP 142/86  Pulse 64  Ht 6\' 3"  (1.905 m)  Wt 149.778 kg (330 lb 3.2 oz)  BMI 41.27 kg/m2  PHYSICAL EXAM: Pt is alert and oriented, pleasant obese male in NAD HEENT: normal Neck: JVP - normal, carotids 2+= without bruits Lungs: CTA bilaterally CV: RRR without murmur or gallop Abd: soft, NT, Positive BS, no hepatomegaly Ext: no C/C/E, distal pulses intact and equal Skin: warm/dry no rash  ASSESSMENT AND PLAN: 1. Coronary artery disease, multivessel. The patient is stable without anginal symptoms. Will continue his current medical program which includes aspirin and Plavix for antiplatelet  therapy, amlodipine and carvedilol as his antianginal drugs, and high-dose pravastatin for lipid-lowering. Will schedule him to followup with Tereso Newcomer in 3 months.  2. Hypertension. Continue amlodipine, carvedilol, clonidine, furosemide, and losartan  3. Hyperlipidemia. Continue Pravachol 80 mg. Last lipid showed cholesterol of 170, HDL 48, LDL 110. I'm not sure that he was reliably taking his statin drug at that time. Will repeat lipids and LFTs when he returns for followup in 3 months.  4. Type 2 diabetes. Blood glucoses by his report have been ranging in the low 100s. I reinforced the importance of him establishing with a primary care physician to manage his diabetes.  Follow-up: Tereso Newcomer 3 months. HgB A1C, CMET, Lipid panel prior to that visit.  Tonny Bollman 11/12/2012 1:45 PM

## 2012-11-12 NOTE — Patient Instructions (Addendum)
Your physician recommends that you schedule a follow-up appointment in: 3 MONTHS with Tereso Newcomer PA-C  Your physician recommends that you return for lab work in: 3 MONTHS (1 week prior to PA appointment)--lipid, liver, BMP and HgbA1c  Your physician recommends that you continue on your current medications as directed. Please refer to the Current Medication list given to you today.

## 2012-11-25 ENCOUNTER — Other Ambulatory Visit: Payer: Self-pay | Admitting: Cardiovascular Disease

## 2012-11-28 ENCOUNTER — Other Ambulatory Visit (HOSPITAL_COMMUNITY): Payer: Self-pay | Admitting: Nurse Practitioner

## 2012-12-01 ENCOUNTER — Other Ambulatory Visit (HOSPITAL_COMMUNITY): Payer: Self-pay | Admitting: Cardiovascular Disease

## 2012-12-27 ENCOUNTER — Encounter (HOSPITAL_COMMUNITY): Payer: Self-pay | Admitting: Emergency Medicine

## 2012-12-27 ENCOUNTER — Emergency Department (HOSPITAL_COMMUNITY)
Admission: EM | Admit: 2012-12-27 | Discharge: 2012-12-27 | Disposition: A | Discharge status: Home / Self Care | Payer: Medicare HMO | Attending: Emergency Medicine | Admitting: Emergency Medicine

## 2012-12-27 ENCOUNTER — Emergency Department (HOSPITAL_COMMUNITY): Payer: Medicare HMO

## 2012-12-27 DIAGNOSIS — E119 Type 2 diabetes mellitus without complications: Secondary | ICD-10-CM | POA: Insufficient documentation

## 2012-12-27 DIAGNOSIS — Z8669 Personal history of other diseases of the nervous system and sense organs: Secondary | ICD-10-CM | POA: Insufficient documentation

## 2012-12-27 DIAGNOSIS — F17200 Nicotine dependence, unspecified, uncomplicated: Secondary | ICD-10-CM | POA: Insufficient documentation

## 2012-12-27 DIAGNOSIS — M7989 Other specified soft tissue disorders: Secondary | ICD-10-CM | POA: Insufficient documentation

## 2012-12-27 DIAGNOSIS — R0602 Shortness of breath: Secondary | ICD-10-CM | POA: Insufficient documentation

## 2012-12-27 DIAGNOSIS — E669 Obesity, unspecified: Secondary | ICD-10-CM | POA: Insufficient documentation

## 2012-12-27 DIAGNOSIS — Z7982 Long term (current) use of aspirin: Secondary | ICD-10-CM | POA: Insufficient documentation

## 2012-12-27 DIAGNOSIS — Z8719 Personal history of other diseases of the digestive system: Secondary | ICD-10-CM | POA: Insufficient documentation

## 2012-12-27 DIAGNOSIS — F3289 Other specified depressive episodes: Secondary | ICD-10-CM | POA: Insufficient documentation

## 2012-12-27 DIAGNOSIS — Z880 Allergy status to penicillin: Secondary | ICD-10-CM | POA: Insufficient documentation

## 2012-12-27 DIAGNOSIS — Z79899 Other long term (current) drug therapy: Secondary | ICD-10-CM | POA: Insufficient documentation

## 2012-12-27 DIAGNOSIS — E785 Hyperlipidemia, unspecified: Secondary | ICD-10-CM | POA: Insufficient documentation

## 2012-12-27 DIAGNOSIS — I2510 Atherosclerotic heart disease of native coronary artery without angina pectoris: Secondary | ICD-10-CM | POA: Insufficient documentation

## 2012-12-27 DIAGNOSIS — Z8701 Personal history of pneumonia (recurrent): Secondary | ICD-10-CM | POA: Insufficient documentation

## 2012-12-27 DIAGNOSIS — Z9861 Coronary angioplasty status: Secondary | ICD-10-CM | POA: Insufficient documentation

## 2012-12-27 DIAGNOSIS — Z8679 Personal history of other diseases of the circulatory system: Secondary | ICD-10-CM | POA: Insufficient documentation

## 2012-12-27 DIAGNOSIS — I10 Essential (primary) hypertension: Secondary | ICD-10-CM | POA: Insufficient documentation

## 2012-12-27 DIAGNOSIS — R609 Edema, unspecified: Secondary | ICD-10-CM

## 2012-12-27 DIAGNOSIS — F329 Major depressive disorder, single episode, unspecified: Secondary | ICD-10-CM | POA: Insufficient documentation

## 2012-12-27 MED ORDER — SODIUM CHLORIDE 0.9 % IV SOLN: INTRAVENOUS | Status: DC

## 2012-12-27 MED ORDER — FUROSEMIDE 40 MG PO TABS
40.0000 mg | ORAL_TABLET | Freq: Once | ORAL | Status: AC
  Administered 2012-12-27: 40 mg via ORAL
  Filled 2012-12-27: qty 1

## 2012-12-27 MED ORDER — FUROSEMIDE 10 MG/ML IJ SOLN
20.0000 mg | Freq: Once | INTRAMUSCULAR | Status: DC
  Filled 2012-12-27: qty 4

## 2012-12-27 MED ORDER — FUROSEMIDE 10 MG/ML IJ SOLN: 40.0000 mg | Freq: Once | INTRAMUSCULAR | Status: DC

## 2012-12-27 MED ORDER — FUROSEMIDE 40 MG PO TABS: 40.0000 mg | ORAL_TABLET | Freq: Two times a day (BID) | ORAL | Status: DC

## 2012-12-27 NOTE — ED Notes (Signed)
Pt c/o bilat lower extremity swelling and slight SHOB, denies CP, denies N/V/D. PWD

## 2012-12-27 NOTE — ED Provider Notes (Signed)
History    CSN: [id number] Arrival date & time 12/27/12  0554  First MD Initiated Contact with Patient 12/27/12 [phone]     Chief Complaint  Patient presents with  . Leg Swelling  . Shortness of Breath   (Consider location/radiation/quality/duration/timing/severity/associated sxs/prior Treatment) Patient is a 57 y.o. male presenting with shortness of breath. The history is provided by the patient (the pt complains of swelling to his feet).  Shortness of Breath Severity:  Mild Onset quality:  Gradual Timing:  Constant Progression:  Unchanged Chronicity:  New Context: activity   Associated symptoms: no abdominal pain, no chest pain, no cough, no headaches and no rash    Past Medical History  Diagnosis Date  . Dyslipidemia   . HTN (hypertension)   . CAD (coronary artery disease)     a. s/p PCI/BMS pRCA 2002; b. PCI pRCA & DES p/m RCA 2006; c. PCI/DES OM4 2007; d. PCI/CBA to RCA for ISR 10/2006; e.08/2012 STEMI/Cath/PCI: LM nl, LAD95-32m (3.0x20 Promus Prem DES), 95/95 ap LAD (1.0-1.91mm), LCX 30p 95d (sm), LPL1 70-80p, patent stent, LPL2 nl, RCA 30p, 40/70 ISR, EF 35-40%.  . Ischemic cardiomyopathy     a. EF 40%; improved to normal;  b. 08/2012 Echo: EF 50-55%, mod LVH.  [name] GERD (gastroesophageal reflux disease)   . Tobacco abuse   . Obesity   . OSA (obstructive sleep apnea)     Does not use CPAP as of 05/2011  . Depression   . History of pneumonia   . Diabetes mellitus     a. A1c 8.8 08/2012->Metformin initiated.   Past Surgical History  Procedure Laterality Date  . Coronary stent placement  2009   Family History  Problem Relation Age of Onset  . Coronary artery disease    . Depression Mother   . Cancer Mother     ovarian  . Hypertension Mother   . Other Father     motor vehicle accident   History  Substance Use Topics  . Smoking status: Current Every Day Smoker -- 0.75 packs/day for 30 years    Types: Cigarettes  . Smokeless tobacco: Never Used  . Alcohol Use: No     Review of Systems  Constitutional: Negative for appetite change and fatigue.  HENT: Negative for congestion, sinus pressure and ear discharge.   Eyes: Negative for discharge.  Respiratory: Positive for shortness of breath. Negative for cough.   Cardiovascular: Negative for chest pain.  Gastrointestinal: Negative for abdominal pain and diarrhea.  Genitourinary: Negative for frequency and hematuria.  Musculoskeletal: Negative for back pain.  Skin: Negative for rash.  Neurological: Negative for seizures and headaches.  Psychiatric/Behavioral: Negative for hallucinations.    Allergies  Penicillins  Home Medications   Current Outpatient Rx  Name  Route  Sig  Dispense  Refill  . amLODipine (NORVASC) 10 MG tablet   Oral   Take 1 tablet (10 mg total) by mouth every morning.   30 tablet   6   . aspirin 81 MG chewable tablet   Oral   Chew 81 mg by mouth every morning.         . carvedilol (COREG) 25 MG tablet   Oral   Take 25 mg by mouth 2 (two) times daily with a meal.          . citalopram (CELEXA) 20 MG tablet   Oral   Take 10 mg by mouth at bedtime.         . cloNIDine (CATAPRES)  0.2 MG tablet   Oral   Take 0.2 mg by mouth 2 (two) times daily.         . clopidogrel (PLAVIX) 75 MG tablet   Oral   Take 1 tablet (75 mg total) by mouth every morning.   30 tablet   6   . famotidine (PEPCID) 40 MG tablet   Oral   Take 1 tablet (40 mg total) by mouth every 12 (twelve) hours.   30 tablet   6   . furosemide (LASIX) 40 MG tablet   Oral   Take 40 mg by mouth every morning.          [name] losartan (COZAAR) 100 MG tablet   Oral   Take 100 mg by mouth every morning.          . metFORMIN (GLUCOPHAGE) 500 MG tablet   Oral   Take 500 mg by mouth daily with breakfast.         . OVER THE COUNTER MEDICATION   Oral   Take 1 capsule by mouth 3 (three) times daily. OTC "Glucose Support" supplement         . potassium chloride SA (K-DUR,KLOR-CON) 20 MEQ  tablet   Oral   Take 20 mEq by mouth every morning.          . pravastatin (PRAVACHOL) 80 MG tablet   Oral   Take 80 mg by mouth every evening.          . nitroGLYCERIN (NITROSTAT) 0.4 MG SL tablet   Sublingual   Place 0.4 mg under the tongue every 5 (five) minutes as needed for chest pain. For chest pain          BP 145/73  Pulse 53  Temp(Src) 97.8 F (36.6 C) (Oral)  Resp 18  Ht 6\' 1"  (1.854 m)  Wt 316 lb (143.337 kg)  BMI 41.7 kg/m2  SpO2 98% Physical Exam  Constitutional: He is oriented to person, place, and time. He appears well-developed.  HENT:  Head: Normocephalic.  Eyes: Conjunctivae and EOM are normal. No scleral icterus.  Neck: Neck supple. No thyromegaly present.  Cardiovascular: Normal rate and regular rhythm.  Exam reveals no gallop and no friction rub.   No murmur heard. Pulmonary/Chest: No stridor. He has no wheezes. He has no rales. He exhibits no tenderness.  Abdominal: He exhibits no distension. There is no tenderness. There is no rebound.  Musculoskeletal: Normal range of motion. He exhibits edema.  2 plus edema in legs  Lymphadenopathy:    He has no cervical adenopathy.  Neurological: He is oriented to person, place, and time. Coordination normal.  Skin: No rash noted. No erythema.  Psychiatric: He has a normal mood and affect. His behavior is normal.    ED Course  Procedures (including critical care time) Labs Reviewed - No data to display Dg Chest 2 View  12/27/2012   *RADIOLOGY REPORT*  Clinical Data: Lower leg edema and shortness of breath.  CHEST - 2 VIEW  Comparison: Chest radiograph performed 08/31/2012  Findings: The lungs are well-aerated.  Peribronchial thickening is seen.  Vascular congestion is noted, with mildly increased interstitial markings, raising question for mild interstitial edema.  Mild right basilar atelectasis is seen.  No pleural effusion or pneumothorax is identified.  The heart is normal in size; the mediastinal  contour is within normal limits.  No acute osseous abnormalities are seen.  IMPRESSION: Vascular congestion, with mildly increased interstitial markings, raising question for mild interstitial  edema.  Mild right basilar atelectasis and peribronchial thickening seen.   Original Report Authenticated By: Tonia Ghent, M.D.   1. Swelling    Date: 12/27/2012  Rate: 59  Rhythm: normal sinus rhythm  QRS Axis: normal  Intervals: normal  ST/T Wave abnormalities: nonspecific ST changes  Conduction Disutrbances:left bundle branch block  Narrative Interpretation:   Old EKG Reviewed: changes noted    MDM  Pt with mild edema in legs will increase lasix  Benny Lennert, MD 12/27/12 1025

## 2012-12-28 ENCOUNTER — Other Ambulatory Visit: Payer: Self-pay | Admitting: Cardiovascular Disease

## 2013-01-02 ENCOUNTER — Other Ambulatory Visit: Payer: Self-pay | Admitting: Cardiovascular Disease

## 2013-01-13 ENCOUNTER — Other Ambulatory Visit: Payer: Self-pay | Admitting: Cardiovascular Disease

## 2013-01-20 ENCOUNTER — Emergency Department (HOSPITAL_COMMUNITY)
Admission: EM | Admit: 2013-01-20 | Discharge: 2013-01-20 | Disposition: A | Discharge status: Home / Self Care | Payer: Medicare HMO | Attending: Emergency Medicine | Admitting: Emergency Medicine

## 2013-01-20 ENCOUNTER — Encounter (HOSPITAL_COMMUNITY): Payer: Self-pay | Admitting: Emergency Medicine

## 2013-01-20 ENCOUNTER — Emergency Department (HOSPITAL_COMMUNITY): Payer: Medicare HMO

## 2013-01-20 DIAGNOSIS — E785 Hyperlipidemia, unspecified: Secondary | ICD-10-CM | POA: Insufficient documentation

## 2013-01-20 DIAGNOSIS — M7989 Other specified soft tissue disorders: Secondary | ICD-10-CM | POA: Insufficient documentation

## 2013-01-20 DIAGNOSIS — Z8669 Personal history of other diseases of the nervous system and sense organs: Secondary | ICD-10-CM | POA: Insufficient documentation

## 2013-01-20 DIAGNOSIS — I2510 Atherosclerotic heart disease of native coronary artery without angina pectoris: Secondary | ICD-10-CM | POA: Insufficient documentation

## 2013-01-20 DIAGNOSIS — K219 Gastro-esophageal reflux disease without esophagitis: Secondary | ICD-10-CM | POA: Insufficient documentation

## 2013-01-20 DIAGNOSIS — Z9861 Coronary angioplasty status: Secondary | ICD-10-CM | POA: Insufficient documentation

## 2013-01-20 DIAGNOSIS — Z79899 Other long term (current) drug therapy: Secondary | ICD-10-CM | POA: Insufficient documentation

## 2013-01-20 DIAGNOSIS — E119 Type 2 diabetes mellitus without complications: Secondary | ICD-10-CM | POA: Insufficient documentation

## 2013-01-20 DIAGNOSIS — R0602 Shortness of breath: Secondary | ICD-10-CM | POA: Insufficient documentation

## 2013-01-20 DIAGNOSIS — Z7902 Long term (current) use of antithrombotics/antiplatelets: Secondary | ICD-10-CM | POA: Insufficient documentation

## 2013-01-20 DIAGNOSIS — F3289 Other specified depressive episodes: Secondary | ICD-10-CM | POA: Insufficient documentation

## 2013-01-20 DIAGNOSIS — Z8679 Personal history of other diseases of the circulatory system: Secondary | ICD-10-CM | POA: Insufficient documentation

## 2013-01-20 DIAGNOSIS — I10 Essential (primary) hypertension: Secondary | ICD-10-CM | POA: Insufficient documentation

## 2013-01-20 DIAGNOSIS — I509 Heart failure, unspecified: Secondary | ICD-10-CM

## 2013-01-20 DIAGNOSIS — Z880 Allergy status to penicillin: Secondary | ICD-10-CM | POA: Insufficient documentation

## 2013-01-20 DIAGNOSIS — R609 Edema, unspecified: Secondary | ICD-10-CM | POA: Insufficient documentation

## 2013-01-20 DIAGNOSIS — R0989 Other specified symptoms and signs involving the circulatory and respiratory systems: Secondary | ICD-10-CM | POA: Insufficient documentation

## 2013-01-20 DIAGNOSIS — Z8701 Personal history of pneumonia (recurrent): Secondary | ICD-10-CM | POA: Insufficient documentation

## 2013-01-20 DIAGNOSIS — Z7982 Long term (current) use of aspirin: Secondary | ICD-10-CM | POA: Insufficient documentation

## 2013-01-20 DIAGNOSIS — R0609 Other forms of dyspnea: Secondary | ICD-10-CM | POA: Insufficient documentation

## 2013-01-20 DIAGNOSIS — F329 Major depressive disorder, single episode, unspecified: Secondary | ICD-10-CM | POA: Insufficient documentation

## 2013-01-20 DIAGNOSIS — E669 Obesity, unspecified: Secondary | ICD-10-CM | POA: Insufficient documentation

## 2013-01-20 LAB — CBC WITH DIFFERENTIAL/PLATELET
HCT: 41.3 % (ref 39.0–52.0)
Hemoglobin: 13.8 g/dL (ref 13.0–17.0)
Lymphocytes Relative: 55 % — ABNORMAL HIGH (ref 12–46)
Monocytes Absolute: 0.6 10*3/uL (ref 0.1–1.0)
Monocytes Relative: 9 % (ref 3–12)
Neutro Abs: 2.2 10*3/uL (ref 1.7–7.7)
WBC: 6.6 10*3/uL (ref 4.0–10.5)

## 2013-01-20 LAB — BASIC METABOLIC PANEL
BUN: 17 mg/dL (ref 6–23)
CO2: 26 mEq/L (ref 19–32)
Chloride: 105 mEq/L (ref 96–112)
Glucose, Bld: 150 mg/dL — ABNORMAL HIGH (ref 70–99)
Potassium: 3.7 mEq/L (ref 3.5–5.1)

## 2013-01-20 NOTE — ED Provider Notes (Signed)
CSN: [id number]     Arrival date & time 01/20/13  4132 History     First MD Initiated Contact with Patient 01/20/13 0848     Chief Complaint  Patient presents with  . Leg Swelling  . Shortness of Breath   (Consider location/radiation/quality/duration/timing/severity/associated sxs/prior Treatment) HPI Comments: Patient is a 57 y/o male with a hx of HTN, CAD, ischemic cardiomyopathy, DM and OSA who presents for b/l lower extremity swelling and shortness of breath x 1 month. Patient states that he used to be able to walk to the end of his driveway without difficulty, but that this now makes him short of breath. States dyspnea on exertion has been constant since symptom onset; shortness of breath significantly improved at rest. Patient has also noticed increased swelling in his b/l ankles. Swelling worsens when upright and improves moderately with elevation. Patient was evaluated for symptoms in ED on 12/27/12 at which time his Lasix was increased to 40mg  BID. Patient states she has been compliant with this medication change, but symptoms have continued to persist. He presents to ED today because believes symptoms worsening x 48 hours. He denies fever, syncope, dizziness, jaw pain, N/V, central/substernal chest pain, abdominal pain, numbness/tingling, and extremity weakness with symptoms.  Cardiologist - Dr. [name]. Per cardiology note on 11/2012, patient with hx of right coronary artery stenting followed by cutting balloon angioplasty for in-stent restenosis in 2008. Also with hx of anterior wall MI in 08/2012; treated with primary PCI and stenting of LAD. Post MI left ventricular ejection fraction was estimated at 50-55% by echo on 09/01/12.  Patient is a 57 y.o. male presenting with shortness of breath. The history is provided by the patient. No language interpreter was used.  Shortness of Breath Associated symptoms: no chest pain, no fever and no vomiting     Past Medical History  Diagnosis Date   . Dyslipidemia   . HTN (hypertension)   . CAD (coronary artery disease)     a. s/p PCI/BMS pRCA 2002; b. PCI pRCA & DES p/m RCA 2006; c. PCI/DES OM4 2007; d. PCI/CBA to RCA for ISR 10/2006; e.08/2012 STEMI/Cath/PCI: LM nl, LAD95-46m (3.0x20 Promus Prem DES), 95/95 ap LAD (1.0-1.30mm), LCX 30p 95d (sm), LPL1 70-80p, patent stent, LPL2 nl, RCA 30p, 40/70 ISR, EF 35-40%.  . Ischemic cardiomyopathy     a. EF 40%; improved to normal;  b. 08/2012 Echo: EF 50-55%, mod LVH.  [name] GERD (gastroesophageal reflux disease)   . Tobacco abuse   . Obesity   . OSA (obstructive sleep apnea)     Does not use CPAP as of 05/2011  . Depression   . History of pneumonia   . Diabetes mellitus     a. A1c 8.8 08/2012->Metformin initiated.   Past Surgical History  Procedure Laterality Date  . Coronary stent placement  2009   Family History  Problem Relation Age of Onset  . Coronary artery disease    . Depression Mother   . Cancer Mother     ovarian  . Hypertension Mother   . Other Father     motor vehicle accident   History  Substance Use Topics  . Smoking status: Current Every Day Smoker -- 0.75 packs/day for 30 years    Types: Cigarettes  . Smokeless tobacco: Never Used  . Alcohol Use: No    Review of Systems  Constitutional: Negative for fever.  Eyes: Negative for visual disturbance.  Respiratory: Positive for shortness of breath.   Cardiovascular: Positive for  leg swelling. Negative for chest pain.  Gastrointestinal: Negative for nausea and vomiting.  Neurological: Negative for syncope, weakness and numbness.  All other systems reviewed and are negative.    Allergies  Penicillins  Home Medications   Current Outpatient Rx  Name  Route  Sig  Dispense  Refill  . amLODipine (NORVASC) 10 MG tablet   Oral   Take 1 tablet (10 mg total) by mouth every morning.   30 tablet   6   . aspirin 81 MG chewable tablet   Oral   Chew 81 mg by mouth every morning.         . carvedilol (COREG) 25 MG  tablet   Oral   Take 25 mg by mouth 2 (two) times daily with a meal.          . citalopram (CELEXA) 20 MG tablet   Oral   Take 10 mg by mouth at bedtime.         . cloNIDine (CATAPRES) 0.2 MG tablet   Oral   Take 0.2 mg by mouth 2 (two) times daily.         . clopidogrel (PLAVIX) 75 MG tablet   Oral   Take 1 tablet (75 mg total) by mouth every morning.   30 tablet   6   . famotidine (PEPCID) 40 MG tablet   Oral   Take 1 tablet (40 mg total) by mouth every 12 (twelve) hours.   30 tablet   6   . furosemide (LASIX) 40 MG tablet   Oral   Take 1 tablet (40 mg total) by mouth 2 (two) times daily.   60 tablet   0   . losartan (COZAAR) 100 MG tablet   Oral   Take 100 mg by mouth every morning.          . metFORMIN (GLUCOPHAGE) 500 MG tablet   Oral   Take 500 mg by mouth daily with breakfast.         . potassium chloride SA (K-DUR,KLOR-CON) 20 MEQ tablet      TAKE 1 TABLET BY MOUTH EVERY DAY   90 tablet   0   . pravastatin (PRAVACHOL) 80 MG tablet   Oral   Take 80 mg by mouth every evening.          . nitroGLYCERIN (NITROSTAT) 0.4 MG SL tablet   Sublingual   Place 0.4 mg under the tongue every 5 (five) minutes as needed for chest pain. For chest pain          BP 141/69  Pulse 52  Temp(Src) 97.9 F (36.6 C) (Oral)  Resp 19  SpO2 97%  Physical Exam  Nursing note and vitals reviewed. Constitutional: He is oriented to person, place, and time. He appears well-developed and well-nourished. No distress.  HENT:  Head: Normocephalic and atraumatic.  Mouth/Throat: Oropharynx is clear and moist. No oropharyngeal exudate.  Eyes: Conjunctivae and EOM are normal. Pupils are equal, round, and reactive to light. No scleral icterus.  Neck: Normal range of motion.  Cardiovascular: Normal rate, regular rhythm and normal heart sounds.   Pulmonary/Chest: Effort normal and breath sounds normal. No respiratory distress. He has no wheezes. He has no rales.   Abdominal: Soft. There is no tenderness. There is no rebound and no guarding.  Musculoskeletal: He exhibits edema (3+ pitting edema in b/l feet and ankles).  Neurological: He is alert and oriented to person, place, and time.  No sensory or motor deficits  appreciated. DTRs normal and symmetric.  Skin: Skin is warm and dry. No rash noted. He is not diaphoretic. No erythema. No pallor.  Psychiatric: He has a normal mood and affect. His behavior is normal.   ED Course   Procedures (including critical care time)  Labs Reviewed  CBC WITH DIFFERENTIAL - Abnormal; Notable for the following:    RDW 15.8 (*)    Neutrophils Relative % 33 (*)    Lymphocytes Relative 55 (*)    All other components within normal limits  BASIC METABOLIC PANEL - Abnormal; Notable for the following:    Glucose, Bld 150 (*)    GFR calc non Af Amer 81 (*)    All other components within normal limits  PRO B NATRIURETIC PEPTIDE - Abnormal; Notable for the following:    Pro B Natriuretic peptide (BNP) 206.6 (*)    All other components within normal limits  TROPONIN I  TROPONIN I    Date: 01/20/2013  Rate: 58  Rhythm: normal sinus rhythm  QRS Axis: normal  Intervals: normal  ST/T Wave abnormalities: normal  Conduction Disutrbances:nonspecific intraventricular conduction delay  Narrative Interpretation: NSR with IVCD; no STEMI  Old EKG Reviewed: unchanged from 12/27/2012 I have personally reviewed and interpreted this EKG  Dg Chest 2 View  01/20/2013   *RADIOLOGY REPORT*  Clinical Data: Shortness of breath  CHEST - 2 VIEW  Comparison: December 27, 2012  Findings: There is bibasilar lung scarring.  Lungs are otherwise clear.  Heart is mildly enlarged with pulmonary venous hypertension.  No adenopathy.  There is anterior wedging of a mid thoracic vertebral body, stable.  IMPRESSION: No edema or consolidation.  There is, however, mild cardiomegaly with a degree of pulmonary venous hypertension.  A degree of volume overload  is questioned.  There is scarring in the lung bases.   Original Report Authenticated By: Bretta Bang, M.D.   1. Shortness of breath 2. Peripheral edema  MDM  Patient with extensive cardiovascular history presents for increasing shortness of breath and dyspnea on exertion with associated bilateral lower extremity edema. Symptoms have been persisting for one month. Patient was seen 1 month ago and was worked up with EKG and chest x-ray both of which were unremarkable. Patient daily dose of Lasix was increased at this time from 40 mg daily to 40 mg twice a day. Symptoms have persisted which brings patient back for evaluation today.  Workup significant for mildly elevated BNP to 206.6. Chest x-ray also with signs of mild cardiomegaly And evidence of pulmonary venous hypertension. Troponin x2 within normal limits. Labs otherwise unremarkable and consistent with prior is. Spoken with physician on call for [facility] cardiology regarding results of today's work up. Dr. Elease Hashimoto from [facility] to see patient.  Patient signed out to oncoming provider, Clinton Sawyer, for dispo as patient waiting to be seen by cardiology.   Antony Madura, PA-C 01/20/13 1534

## 2013-01-20 NOTE — ED Provider Notes (Signed)
Assumed care from PA Humes at shift change. Patient was awaiting cardiology consult. Patient consulted by Dr. Elease Hashimoto who feels as if patient is stable for discharge. Patient will be discharged home.  Trevor Mace, PA-C 01/20/13 1635

## 2013-01-20 NOTE — Consult Note (Signed)
ADMISSION HISTORY AND PHYSICAL   Date: 01/20/2013               Patient Name:  [name] [name] MRN: [id number]  DOB: [birth year] Age / Sex: 57 y.o., male        PCP: No primary provider on file. Primary Cardiologist: Calton Dach, MD  Referred from [facility]       History of Present Illness: Patient is a 57 y.o. male with a PMHx of hypertension, coronary artery disease, ischemic cardiomyopathy, diabetes mellitus, obstructive sleep apnea, who was admitted to [facility] on 01/20/2013 for evaluation of progressive shortness of breath and leg swelling for the past month or so.  Patient is a 57 y/o male with a hx of HTN, CAD, ischemic cardiomyopathy, DM and OSA who presents for b/l lower extremity swelling and shortness of breath x 1 month. Patient states that he used to be able to walk to the end of his driveway without difficulty, but that this now makes him short of breath. States dyspnea on exertion has been constant since symptom onset; shortness of breath significantly improved at rest. Patient has also noticed increased swelling in his b/l ankles. Swelling worsens when upright and improves moderately with elevation. Patient was evaluated for symptoms in ED on 12/27/12 at which time his Lasix was increased to 40mg  BID. Patient states she has been compliant with this medication change, but symptoms have continued to persist. He presents to ED today because believes symptoms worsening x 48 hours. He denies fever, syncope, dizziness, jaw pain, N/V, central/substernal chest pain, abdominal pain, numbness/tingling, and extremity weakness with symptoms.  He has been eating some salty foods recently - ate hot dogs this past weekend.   He has been eating fast foods.   His symptoms are not at all similar to his presenting symptoms when he had his NSTEMI in March.    He had similar symptoms several weeks ago and his lasix was increased from 40 a day to 40 bid.  He came to the ER and has diuresed quite a bit.  He feels  completely normal and would like to go home.    Medications: Outpatient medications:  (Not in a hospital admission)  Allergies  Allergen Reactions  . Penicillins Other (See Comments)    Unknown childhood reaction     Past Medical History  Diagnosis Date  . Dyslipidemia   . HTN (hypertension)   . CAD (coronary artery disease)     a. s/p PCI/BMS pRCA 2002; b. PCI pRCA & DES p/m RCA 2006; c. PCI/DES OM4 2007; d. PCI/CBA to RCA for ISR 10/2006; e.08/2012 STEMI/Cath/PCI: LM nl, LAD95-85m (3.0x20 Promus Prem DES), 95/95 ap LAD (1.0-1.12mm), LCX 30p 95d (sm), LPL1 70-80p, patent stent, LPL2 nl, RCA 30p, 40/70 ISR, EF 35-40%.  . Ischemic cardiomyopathy     a. EF 40%; improved to normal;  b. 08/2012 Echo: EF 50-55%, mod LVH.  [name] GERD (gastroesophageal reflux disease)   . Tobacco abuse   . Obesity   . OSA (obstructive sleep apnea)     Does not use CPAP as of 05/2011  . Depression   . History of pneumonia   . Diabetes mellitus     a. A1c 8.8 08/2012->Metformin initiated.    Past Surgical History  Procedure Laterality Date  . Coronary stent placement  2009    Family History  Problem Relation Age of Onset  . Coronary artery disease    . Depression Mother   . Cancer Mother  ovarian  . Hypertension Mother   . Other Father     motor vehicle accident    Social History:  reports that he has been smoking Cigarettes.  He has a 22.5 pack-year smoking history. He has never used smokeless tobacco. He reports that he does not drink alcohol or use illicit drugs.   Review of Systems: Constitutional:  denies fever, chills, diaphoresis, appetite change and fatigue.  HEENT: denies photophobia, eye pain, redness, hearing loss, ear pain, congestion, sore throat, rhinorrhea, sneezing, neck pain, neck stiffness and tinnitus.  Respiratory: admits to SOB, DOE, cough,    Cardiovascular: denies chest pain, palpitations and leg swelling.  Gastrointestinal: denies nausea, vomiting, abdominal pain,  diarrhea, constipation, blood in stool.  Genitourinary: denies dysuria, urgency, frequency, hematuria, flank pain and difficulty urinating.  Musculoskeletal: denies  myalgias, back pain, joint swelling, arthralgias and gait problem.   Skin: denies pallor, rash and wound.  Neurological: denies dizziness, seizures, syncope, weakness, light-headedness, numbness and headaches.   Hematological: denies adenopathy, easy bruising, personal or family bleeding history.  Psychiatric/ Behavioral: denies suicidal ideation, mood changes, confusion, nervousness, sleep disturbance and agitation.    Physical Exam: BP 128/85  Pulse 52  Temp(Src) 97.9 F (36.6 C) (Oral)  Resp 16  SpO2 100%  General: Vital signs reviewed and noted. Well-developed, well-nourished, in no acute distress; alert, appropriate and cooperative throughout examination.  Head: Normocephalic, atraumatic, sclera anicteric, mucus membranes are moist  Neck: Supple. Negative for carotid bruits. JVD not elevated.  Lungs:  Clear bilaterally to auscultation without wheezes, rales, or rhonchi. Breathing is unlabored.  Heart: RRR with S1 S2. No murmurs, rubs, or gallops appreciated.  Abdomen:  Soft, non-tender, non-distended with normoactive bowel sounds. No hepatomegaly. No rebound/guarding. No obvious abdominal masses  MSK: Strength and the appear normal for age.  Extremities: No clubbing or cyanosis. Trace - 1+ edema  Distal pedal pulses are 2+ and equal bilaterally.  Neurologic: Alert and oriented X 3. Moves all extremities spontaneously  Psych:  Responds to questions appropriately with a normal affect.    Lab results: Basic Metabolic Panel:  Recent Labs Lab 01/20/13 0930  NA 141  K 3.7  CL 105  CO2 26  GLUCOSE 150*  BUN 17  CREATININE 1.02  CALCIUM 9.0    Liver Function Tests: No results found for this basename: AST, ALT, ALKPHOS, BILITOT, PROT, ALBUMIN,  in the last 168 hours No results found for this basename: LIPASE,  AMYLASE,  in the last 168 hours  CBC:  Recent Labs Lab 01/20/13 0930  WBC 6.6  NEUTROABS 2.2  HGB 13.8  HCT 41.3  MCV 85.2  PLT 174    Cardiac Enzymes:  Recent Labs Lab 01/20/13 0930 01/20/13 1350  TROPONINI <0.30 <0.30    BNP: No components found with this basename: POCBNP,   CBG: No results found for this basename: GLUCAP,  in the last 168 hours  Coagulation Studies: No results found for this basename: LABPROT, INR,  in the last 72 hours   Other results:  EKG  :  NSR, no acute ST changes.  TWI laterally    Imaging: Dg Chest 2 View  01/20/2013   *RADIOLOGY REPORT*  Clinical Data: Shortness of breath  CHEST - 2 VIEW  Comparison: December 27, 2012  Findings: There is bibasilar lung scarring.  Lungs are otherwise clear.  Heart is mildly enlarged with pulmonary venous hypertension.  No adenopathy.  There is anterior wedging of a mid thoracic vertebral body, stable.  IMPRESSION:  No edema or consolidation.  There is, however, mild cardiomegaly with a degree of pulmonary venous hypertension.  A degree of volume overload is questioned.  There is scarring in the lung bases.   Original Report Authenticated By: Bretta Bang, M.D.      Assessment & Plan:  Mild acute on chronic congestive heart failure: The patient presents with very mild congestive heart failure. He has been seen with similar symptoms several weeks ago. Upon questioning, it is clear that he has lots of dietary indiscretion. He has been eating lots of salty foods. he's been eating hotdogs and fast foods on regular basis.  Here in the emergency room he is diuresed quite nicely. I do not see any evidence that that he has received IV Lasix but perhaps bedrest here through the day has helped and diurese.  He will continue with his double dose Lasix for the next 3 days. He'll then resume his once a day Lasix dosing   He's not having any symptoms of unstable angina.  He will return to see Dr. [name] in the  office.   Vesta Mixer, Montez Hageman., MD, [facility] 01/20/2013, 4:20 PM

## 2013-01-20 NOTE — ED Notes (Signed)
Patient transported to X-ray 

## 2013-01-20 NOTE — ED Notes (Signed)
Pt states that he has hx of CHF and MI.  States that he has been having leg swelling and increased SOB x 2 wks.  Denies CP.

## 2013-01-21 NOTE — ED Provider Notes (Signed)
Medical screening examination/treatment/procedure(s) were performed by non-physician practitioner and as supervising physician I was immediately available for consultation/collaboration.  Flint Melter, MD 01/21/13 [phone]

## 2013-01-23 NOTE — ED Provider Notes (Signed)
Medical screening examination/treatment/procedure(s) were performed by non-physician practitioner and as supervising physician I was immediately available for consultation/collaboration.   Claudean Kinds, MD 01/23/13 [phone]

## 2013-02-02 ENCOUNTER — Other Ambulatory Visit: Payer: Self-pay | Admitting: Cardiovascular Disease

## 2013-02-19 ENCOUNTER — Other Ambulatory Visit (INDEPENDENT_AMBULATORY_CARE_PROVIDER_SITE_OTHER): Payer: Medicare HMO

## 2013-02-19 DIAGNOSIS — I2510 Atherosclerotic heart disease of native coronary artery without angina pectoris: Secondary | ICD-10-CM

## 2013-02-19 DIAGNOSIS — E119 Type 2 diabetes mellitus without complications: Secondary | ICD-10-CM

## 2013-02-19 DIAGNOSIS — I10 Essential (primary) hypertension: Secondary | ICD-10-CM

## 2013-02-19 LAB — LIPID PANEL
Cholesterol: 109 mg/dL (ref 0–200)
LDL Cholesterol: 60 mg/dL (ref 0–99)
Triglycerides: 58 mg/dL (ref 0.0–149.0)
VLDL: 11.6 mg/dL (ref 0.0–40.0)

## 2013-02-19 LAB — BASIC METABOLIC PANEL
Chloride: 107 mEq/L (ref 96–112)
Creatinine, Ser: 1.1 mg/dL (ref 0.4–1.5)
Potassium: 3.8 mEq/L (ref 3.5–5.1)

## 2013-02-19 LAB — HEPATIC FUNCTION PANEL
Albumin: 3.8 g/dL (ref 3.5–5.2)
Alkaline Phosphatase: 55 U/L (ref 39–117)
Total Protein: 6.9 g/dL (ref 6.0–8.3)

## 2013-02-19 LAB — HEMOGLOBIN A1C: Hgb A1c MFr Bld: 6.6 % — ABNORMAL HIGH (ref 4.6–6.5)

## 2013-02-26 ENCOUNTER — Encounter: Payer: Self-pay | Admitting: Physician Assistant

## 2013-02-26 ENCOUNTER — Ambulatory Visit (INDEPENDENT_AMBULATORY_CARE_PROVIDER_SITE_OTHER): Payer: Medicare HMO | Admitting: Physician Assistant

## 2013-02-26 VITALS — BP 132/80 | HR 57 | Ht 73.0 in | Wt 325.0 lb

## 2013-02-26 DIAGNOSIS — E119 Type 2 diabetes mellitus without complications: Secondary | ICD-10-CM

## 2013-02-26 DIAGNOSIS — Z720 Tobacco use: Secondary | ICD-10-CM

## 2013-02-26 DIAGNOSIS — I5032 Chronic diastolic (congestive) heart failure: Secondary | ICD-10-CM

## 2013-02-26 DIAGNOSIS — I2510 Atherosclerotic heart disease of native coronary artery without angina pectoris: Secondary | ICD-10-CM

## 2013-02-26 DIAGNOSIS — I10 Essential (primary) hypertension: Secondary | ICD-10-CM

## 2013-02-26 DIAGNOSIS — F17200 Nicotine dependence, unspecified, uncomplicated: Secondary | ICD-10-CM

## 2013-02-26 DIAGNOSIS — E785 Hyperlipidemia, unspecified: Secondary | ICD-10-CM

## 2013-02-26 MED ORDER — POTASSIUM CHLORIDE CRYS ER 20 MEQ PO TBCR: 20.0000 meq | EXTENDED_RELEASE_TABLET | Freq: Two times a day (BID) | ORAL | Status: DC

## 2013-02-26 MED ORDER — FUROSEMIDE 40 MG PO TABS: 60.0000 mg | ORAL_TABLET | Freq: Two times a day (BID) | ORAL | Status: DC

## 2013-02-26 NOTE — Patient Instructions (Addendum)
INCREASE LASIX TO 60 MG TWICE DAILY INCREASE POTASSIUM TO 20 MEQ TWICE DAILY; REFILLS HAVE BEEN SENT IN TODAY  LAB WORK; BMET IN 1 WEEK  You have been referred to CARDIAC REHAB AT [facility]  PLEASE REFER PT TO [facility] TO EST. WITH A PRIMARY CARE PHYSICIAN DX DM, HTN  PLEASE FOLLOW UP WITH SCOTT [name], PAC IN ABOUT 6 WEEKS  2 Gram Low Sodium Diet A 2 gram sodium diet restricts the amount of sodium in the diet to no more than 2 g or 2000 mg daily. Limiting the amount of sodium is often used to help lower blood pressure. It is important if you have heart, liver, or kidney problems. Many foods contain sodium for flavor and sometimes as a preservative. When the amount of sodium in a diet needs to be low, it is important to know what to look for when choosing foods and drinks. The following includes some information and guidelines to help make it easier for you to adapt to a low sodium diet. QUICK TIPS  Do not add salt to food.  Avoid convenience items and fast food.  Choose unsalted snack foods.  Buy lower sodium products, often labeled as "lower sodium" or "no salt added."  Check food labels to learn how much sodium is in 1 serving.  When eating at a restaurant, ask that your food be prepared with less salt or none, if possible. READING FOOD LABELS FOR SODIUM INFORMATION The nutrition facts label is a good place to find how much sodium is in foods. Look for products with no more than 500 to 600 mg of sodium per meal and no more than 150 mg per serving. Remember that 2 g = 2000 mg. The food label may also list foods as:  Sodium-free: Less than 5 mg in a serving.  Very low sodium: 35 mg or less in a serving.  Low-sodium: 140 mg or less in a serving.  Light in sodium: 50% less sodium in a serving. For example, if a food that usually has 300 mg of sodium is changed to become light in sodium, it will have 150 mg of sodium.  Reduced sodium: 25% less sodium in a serving. For  example, if a food that usually has 400 mg of sodium is changed to reduced sodium, it will have 300 mg of sodium. CHOOSING FOODS Grains  Avoid: Salted crackers and snack items. Some cereals, including instant hot cereals. Bread stuffing and biscuit mixes. Seasoned rice or pasta mixes.  Choose: Unsalted snack items. Low-sodium cereals, oats, puffed wheat and rice, shredded wheat. English muffins and bread. Pasta. Meats  Avoid: Salted, canned, smoked, spiced, pickled meats, including fish and poultry. Bacon, ham, sausage, cold cuts, hot dogs, anchovies.  Choose: Low-sodium canned tuna and salmon. Fresh or frozen meat, poultry, and fish. Dairy  Avoid: Processed cheese and spreads. Cottage cheese. Buttermilk and condensed milk. Regular cheese.  Choose: Milk. Low-sodium cottage cheese. Yogurt. Sour cream. Low-sodium cheese. Fruits and Vegetables  Avoid: Regular canned vegetables. Regular canned tomato sauce and paste. Frozen vegetables in sauces. Olives. Rosita Fire. Relishes. Sauerkraut.  Choose: Low-sodium canned vegetables. Low-sodium tomato sauce and paste. Frozen or fresh vegetables. Fresh and frozen fruit. Condiments  Avoid: Canned and packaged gravies. Worcestershire sauce. Tartar sauce. Barbecue sauce. Soy sauce. Steak sauce. Ketchup. Onion, garlic, and table salt. Meat flavorings and tenderizers.  Choose: Fresh and dried herbs and spices. Low-sodium varieties of mustard and ketchup. Lemon juice. Tabasco sauce. Horseradish. SAMPLE 2 GRAM SODIUM  MEAL PLAN Breakfast / Sodium (mg)  1 cup low-fat milk / 143 mg  2 slices whole-wheat toast / 270 mg  1 tbs heart-healthy margarine / 153 mg  1 hard-boiled egg / 139 mg  1 small orange / 0 mg Lunch / Sodium (mg)  1 cup raw carrots / 76 mg   cup hummus / 298 mg  1 cup low-fat milk / 143 mg   cup red grapes / 2 mg  1 whole-wheat pita bread / 356 mg Dinner / Sodium (mg)  1 cup whole-wheat pasta / 2 mg  1 cup low-sodium tomato  sauce / 73 mg  3 oz lean ground beef / 57 mg  1 small side salad (1 cup raw spinach leaves,  cup cucumber,  cup yellow bell pepper) with 1 tsp olive oil and 1 tsp red wine vinegar / 25 mg Snack / Sodium (mg)  1 container low-fat vanilla yogurt / 107 mg  3 graham cracker squares / 127 mg Nutrient Analysis  Calories: 2033  Protein: 77 g  Carbohydrate: 282 g  Fat: 72 g  Sodium: 1971 mg Document Released: 05/28/2005 Document Revised: 08/20/2011 Document Reviewed: 08/29/2009 Exit[employer] Patient Information 2014 [place], [place].

## 2013-02-26 NOTE — Progress Notes (Signed)
[address] [place], [place]  [address] Phone: [phone] Fax:  [phone]  Date:  02/26/2013   ID:  [name] [name], DOB June 01, 1956, MRN [id number]  PCP:  No primary provider on file.  Cardiologist:  Dr. Tonny Bollman     History of Present Illness: [name] [name] is a 57 y.o. male who returns for follow up.  He is a prior patient of mine from [facility]. He has a hx of CAD, s/p PCI to the RCA and PL1, cutting balloon angioplasty secondary to in-stent restenosis of the RCA in 5/08, ischemic cardiomyopathy with previous EF 40%, improved to 55-60%, HTN, HL.  Myoview 9/12: mod scar in base/mid inf and IL segments, slight peri-infarct ischemia - low risk (med Rx continued).  He was admitted 3/14 with an anterior STEMI treated with a Promus DES to the LAD.  He did have residual disease in the circumflex with 70-80% in the PL1 branch prior to the previous stent (stent was patent), continuation of AV groove circumflex 95%, mid RCA stent 40% (proximal portion) and 70% (more distal portion).  Medical therapy was recommended. EF was 35-40%.  Follow up echo 09/01/12: Inferobasal HK, moderate LVH, EF 50-55%, mild LAE.  Last seen by Dr. [name] 11/2012.  In the interim, he was seen by Dr. Elease Hashimoto in the emergency room last month with a/c diastolic CHF. His Lasix dose was increased for several days.  He continues to note LE edema.  This is sometimes better in the AM after laying flat.  He notes DOE.  He probably describes NYHA Class IIb-III symptoms.  Usually sleeps propped up.  No PND.  He is compliant sometimes with his CPAP.  No syncope.  No CP.  He continues to follow a diet high in salt (eats out at [employer], hot dogs, etc).  He is still smoking.   Labs (8/14):  K 3.7, Cr 1.02, proBNP 206, Hgb 13.8  Labs (9/14):  K 3.8, Cr 1.1, ALT 17, HDL 37.9, LDL 60, Hgb A1c 6.6  Wt Readings from Last 3 Encounters:  02/26/13 325 lb (147.419 kg)  12/27/12 316 lb (143.337 kg)  11/12/12 330  lb 3.2 oz (149.778 kg)     Past Medical History  Diagnosis Date  . Dyslipidemia   . HTN (hypertension)   . CAD (coronary artery disease)     a. s/p PCI/BMS pRCA 2002; b. PCI pRCA & DES p/m RCA 2006; c. PCI/DES OM4 2007; d. PCI/CBA to RCA for ISR 10/2006; e.08/2012 STEMI/Cath/PCI: LM nl, LAD95-100m (3.0x20 Promus Prem DES), 95/95 ap LAD (1.0-1.44mm), LCX 30p 95d (sm), LPL1 70-80p, patent stent, LPL2 nl, RCA 30p, 40/70 ISR, EF 35-40%.  . Ischemic cardiomyopathy     a. EF 40%; improved to normal;  b. 08/2012 Echo: EF 50-55%, mod LVH.  [name] GERD (gastroesophageal reflux disease)   . Tobacco abuse   . Obesity   . OSA (obstructive sleep apnea)     Does not use CPAP as of 05/2011  . Depression   . History of pneumonia   . Diabetes mellitus     a. A1c 8.8 08/2012->Metformin initiated. => b. A1c (9/14): 6.6    Current Outpatient Prescriptions  Medication Sig Dispense Refill  . amLODipine (NORVASC) 10 MG tablet Take 1 tablet (10 mg total) by mouth every morning.  30 tablet  6  . aspirin 81 MG chewable tablet Chew 81 mg by mouth every morning.      . carvedilol (COREG) 25 MG tablet  Take 25 mg by mouth 2 (two) times daily with a meal.       . citalopram (CELEXA) 20 MG tablet Take 10 mg by mouth at bedtime.      . cloNIDine (CATAPRES) 0.2 MG tablet TAKE 1 TABLET BY MOUTH TWICE DAILY  60 tablet  3  . clopidogrel (PLAVIX) 75 MG tablet Take 1 tablet (75 mg total) by mouth every morning.  30 tablet  6  . famotidine (PEPCID) 40 MG tablet Take 1 tablet (40 mg total) by mouth every 12 (twelve) hours.  30 tablet  6  . furosemide (LASIX) 40 MG tablet Take 1 tablet (40 mg total) by mouth 2 (two) times daily.  60 tablet  0  . losartan (COZAAR) 100 MG tablet Take 100 mg by mouth every morning.       . metFORMIN (GLUCOPHAGE) 500 MG tablet Take 500 mg by mouth daily with breakfast.      . nitroGLYCERIN (NITROSTAT) 0.4 MG SL tablet Place 0.4 mg under the tongue every 5 (five) minutes as needed for chest pain. For chest  pain      . potassium chloride SA (K-DUR,KLOR-CON) 20 MEQ tablet TAKE 1 TABLET BY MOUTH EVERY DAY  90 tablet  0  . pravastatin (PRAVACHOL) 80 MG tablet Take 80 mg by mouth every evening.        No current facility-administered medications for this visit.    Allergies:    Allergies  Allergen Reactions  . Penicillins Other (See Comments)    Unknown childhood reaction    Social History:  The patient  reports that he has been smoking Cigarettes.  He has a 22.5 pack-year smoking history. He has never used smokeless tobacco. He reports that he does not drink alcohol or use illicit drugs.   ROS:  Please see the history of present illness.   He has a NP cough.   All other systems reviewed and negative.   PHYSICAL EXAM: VS:  BP 132/80  Pulse 57  Ht 6\' 1"  (1.854 m)  Wt 325 lb (147.419 kg)  BMI 42.89 kg/m2 Well nourished, well developed, in no acute distress HEENT: normal Neck: I cannot appreciate JVD Cardiac:  normal S1, S2; RRR; no murmur Lungs:  Decreased breath sounds bilaterally, no wheezing, rhonchi or rales Abd: soft, nontender, no hepatomegaly Ext: 1+ bilateral LE edema Skin: warm and dry Neuro:  CNs 2-12 intact, no focal abnormalities noted  EKG:  Sinus brady, HR 57, LAFB, RBBB, no changes     ASSESSMENT AND PLAN:  1. CAD:  No angina.  Continue ASA, Plavix, coreg, amlodipine, losartan and statin.  He tells me he was never enrolled in cardiac rehab after his STEMI in 08/2012.  He is interested in pursuing this.  Will refer him.   2. Chronic Diastolic CHF:  He has worsening dyspnea and LE edema.  I suspect his symptoms are multifactorial and related to a combination of obesity, ongoing tobacco abuse with probable COPD, CAD, diastolic dysfunction, venous insufficiency, high salt diet.  I will adjust his Lasix to 60 mg BID and K+ to 20 BID.  Check BMET in 1 week. We discussed the importance of limiting his salt.   3. Hypertension:  BP fairly well controlled.  Continue current Rx.    4. Hyperlipidemia:  LDL ok.  Continue statin.  5. Diabetes Mellitus:  A1c ok.  Renal fxn ok to continue metformin.  Refer to PCP. 6. Tobacco Abuse:  We discussed the importance of quitting.  7. Obesity:  We discussed the importance of weight loss. 8. Disposition:  F/u with me in 6 weeks.   Signed, Tereso Newcomer, PA-C  02/26/2013 8:41 AM

## 2013-03-05 ENCOUNTER — Other Ambulatory Visit (INDEPENDENT_AMBULATORY_CARE_PROVIDER_SITE_OTHER): Payer: Medicare HMO

## 2013-03-05 DIAGNOSIS — I10 Essential (primary) hypertension: Secondary | ICD-10-CM

## 2013-03-05 DIAGNOSIS — I5032 Chronic diastolic (congestive) heart failure: Secondary | ICD-10-CM

## 2013-03-05 LAB — BASIC METABOLIC PANEL
CO2: 32 mEq/L (ref 19–32)
Calcium: 9.2 mg/dL (ref 8.4–10.5)
Sodium: 142 mEq/L (ref 135–145)

## 2013-03-06 ENCOUNTER — Telehealth: Payer: Self-pay | Admitting: *Deleted

## 2013-03-06 NOTE — Telephone Encounter (Signed)
could not lm, recording said "the person you have called is unavailable; please try again later".

## 2013-03-09 ENCOUNTER — Encounter: Payer: Self-pay | Admitting: *Deleted

## 2013-03-09 NOTE — Telephone Encounter (Signed)
could lm for ptcb for lab results x 2 . I will send out a results letter to pt today

## 2013-04-02 ENCOUNTER — Encounter: Payer: Medicare HMO | Admitting: Physician Assistant

## 2013-04-02 NOTE — Progress Notes (Signed)
This encounter was created in error - please disregard.

## 2013-04-06 ENCOUNTER — Other Ambulatory Visit: Payer: Self-pay | Admitting: Cardiovascular Disease

## 2013-04-07 ENCOUNTER — Other Ambulatory Visit: Payer: Self-pay | Admitting: Cardiovascular Disease

## 2013-04-09 ENCOUNTER — Encounter: Payer: Self-pay | Admitting: Physician Assistant

## 2013-04-11 ENCOUNTER — Other Ambulatory Visit: Payer: Self-pay | Admitting: Cardiovascular Disease

## 2013-05-03 ENCOUNTER — Other Ambulatory Visit: Payer: Self-pay | Admitting: Cardiovascular Disease

## 2013-05-04 ENCOUNTER — Other Ambulatory Visit: Payer: Self-pay | Admitting: Cardiovascular Disease

## 2013-05-11 ENCOUNTER — Other Ambulatory Visit: Payer: Self-pay | Admitting: Cardiovascular Disease

## 2013-05-14 ENCOUNTER — Encounter: Payer: Self-pay | Admitting: Internal Medicine

## 2013-05-16 ENCOUNTER — Other Ambulatory Visit: Payer: Self-pay | Admitting: Cardiovascular Disease

## 2013-05-22 ENCOUNTER — Ambulatory Visit: Payer: Medicare HMO | Admitting: Internal Medicine

## 2013-05-26 ENCOUNTER — Telehealth (HOSPITAL_COMMUNITY): Payer: Self-pay | Admitting: Cardiac Rehabilitation

## 2013-05-26 NOTE — Telephone Encounter (Signed)
Pt phone not in service. Letter mailed without response from patient.

## 2013-05-28 ENCOUNTER — Ambulatory Visit: Payer: Medicare HMO | Admitting: Internal Medicine

## 2013-06-01 ENCOUNTER — Other Ambulatory Visit: Payer: Self-pay | Admitting: Cardiovascular Disease

## 2013-06-02 ENCOUNTER — Other Ambulatory Visit: Payer: Self-pay | Admitting: Cardiovascular Disease

## 2013-06-07 ENCOUNTER — Other Ambulatory Visit (HOSPITAL_COMMUNITY): Payer: Self-pay | Admitting: Cardiovascular Disease

## 2013-06-12 ENCOUNTER — Other Ambulatory Visit: Payer: Self-pay | Admitting: Cardiovascular Disease

## 2013-06-14 ENCOUNTER — Other Ambulatory Visit (HOSPITAL_COMMUNITY): Payer: Self-pay | Admitting: Cardiovascular Disease

## 2013-06-19 ENCOUNTER — Other Ambulatory Visit (HOSPITAL_COMMUNITY): Payer: Self-pay | Admitting: Physician Assistant

## 2013-06-19 ENCOUNTER — Other Ambulatory Visit (HOSPITAL_COMMUNITY): Payer: Self-pay | Admitting: Cardiovascular Disease

## 2013-06-19 ENCOUNTER — Other Ambulatory Visit: Payer: Self-pay | Admitting: Cardiovascular Disease

## 2013-06-19 NOTE — Telephone Encounter (Signed)
The pt is scheduled to establish with PCP on 06/26/13. We can authorize a 30 day supply but future refills will need to come from PCP.

## 2013-06-19 NOTE — Telephone Encounter (Signed)
Lauren, should this be deferred to pcp if he has one or will Dr [name] refill? Please advise. Thanks, MI

## 2013-06-26 ENCOUNTER — Ambulatory Visit (INDEPENDENT_AMBULATORY_CARE_PROVIDER_SITE_OTHER): Payer: Medicare HMO | Admitting: Internal Medicine

## 2013-06-26 ENCOUNTER — Encounter: Payer: Self-pay | Admitting: Internal Medicine

## 2013-06-26 VITALS — BP 132/86 | HR 54 | Temp 98.3°F | Ht 73.0 in | Wt 318.5 lb

## 2013-06-26 DIAGNOSIS — I2510 Atherosclerotic heart disease of native coronary artery without angina pectoris: Secondary | ICD-10-CM

## 2013-06-26 DIAGNOSIS — Z23 Encounter for immunization: Secondary | ICD-10-CM

## 2013-06-26 DIAGNOSIS — E119 Type 2 diabetes mellitus without complications: Secondary | ICD-10-CM

## 2013-06-26 DIAGNOSIS — F17200 Nicotine dependence, unspecified, uncomplicated: Secondary | ICD-10-CM

## 2013-06-26 DIAGNOSIS — L84 Corns and callosities: Secondary | ICD-10-CM

## 2013-06-26 DIAGNOSIS — Z0000 Encounter for general adult medical examination without abnormal findings: Secondary | ICD-10-CM | POA: Insufficient documentation

## 2013-06-26 DIAGNOSIS — R269 Unspecified abnormalities of gait and mobility: Secondary | ICD-10-CM

## 2013-06-26 MED ORDER — PRAVASTATIN SODIUM 80 MG PO TABS: 80.0000 mg | ORAL_TABLET | Freq: Every evening | ORAL | Status: DC

## 2013-06-26 MED ORDER — AMLODIPINE BESYLATE 10 MG PO TABS: 10.0000 mg | ORAL_TABLET | Freq: Every day | ORAL | Status: DC

## 2013-06-26 MED ORDER — CLOPIDOGREL BISULFATE 75 MG PO TABS: 75.0000 mg | ORAL_TABLET | Freq: Every morning | ORAL | Status: DC

## 2013-06-26 NOTE — Assessment & Plan Note (Signed)
For podiatry referral 

## 2013-06-26 NOTE — Progress Notes (Signed)
Subjective:    Patient ID: [name] [name], male    DOB: 10/25/1955, 58 y.o.   MRN: [id number]  HPI   Here for wellness and f/u;  Overall doing ok;  Pt denies CP, worsening SOB, DOE, wheezing, orthopnea, PND, worsening LE edema, palpitations, dizziness or syncope.  Pt denies neurological change such as new headache, facial or extremity weakness.  Pt denies polydipsia, polyuria, or low sugar symptoms. Pt states overall good compliance with treatment and medications, good tolerability, and has been trying to follow lower cholesterol diet.  Pt denies worsening depressive symptoms, suicidal ideation or panic. No fever, night sweats, wt loss, loss of appetite, or other constitutional symptoms.  Pt states good ability with ADL's, has low fall risk, home safety reviewed and adequate, no other significant changes in hearing or vision, and only occasionally active with exercise.  Also with unusual gait problem, ? Dizziness in the past 6 mo, was advised to see neurology but did not f/u.  Has foot callous - needs to see Podiatry.  Needs routine referral back to cardiology due to ins requirement.  Past Medical History  Diagnosis Date  . Dyslipidemia   . HTN (hypertension)   . CAD (coronary artery disease)     a. s/p PCI/BMS pRCA 2002; b. PCI pRCA & DES p/m RCA 2006; c. PCI/DES OM4 2007; d. PCI/CBA to RCA for ISR 10/2006; e.08/2012 STEMI/Cath/PCI: LM nl, LAD95-17m (3.0x20 Promus Prem DES), 95/95 ap LAD (1.0-1.47mm), LCX 30p 95d (sm), LPL1 70-80p, patent stent, LPL2 nl, RCA 30p, 40/70 ISR, EF 35-40%.  . Ischemic cardiomyopathy     a. EF 40%; improved to normal;  b. 08/2012 Echo: EF 50-55%, mod LVH.  [name] GERD (gastroesophageal reflux disease)   . Tobacco abuse   . Obesity   . OSA (obstructive sleep apnea)     Does not use CPAP as of 05/2011  . Depression   . History of pneumonia   . Diabetes mellitus     a. A1c 8.8 08/2012->Metformin initiated. => b. A1c (9/14): 6.6  . Hyperlipidemia    Past Surgical History    Procedure Laterality Date  . Coronary stent placement  2009    reports that he has been smoking Cigarettes.  He has a 22.5 pack-year smoking history. He has never used smokeless tobacco. He reports that he does not drink alcohol or use illicit drugs. family history includes Cancer in his mother; Coronary artery disease in an other family member; Depression in his mother; Hypertension in his mother; Other in his father. Allergies  Allergen Reactions  . Penicillins Other (See Comments)    Unknown childhood reaction   Current Outpatient Prescriptions on File Prior to Visit  Medication Sig Dispense Refill  . amLODipine (NORVASC) 10 MG tablet Take 1 tablet (10 mg total) by mouth every morning.  30 tablet  6  . aspirin 81 MG chewable tablet Chew 81 mg by mouth every morning.      . carvedilol (COREG) 25 MG tablet TAKE 1 TABLET BY MOUTH TWICE DAILY  60 tablet  0  . citalopram (CELEXA) 20 MG tablet Take 10 mg by mouth at bedtime.      . cloNIDine (CATAPRES) 0.2 MG tablet TAKE 1 TABLET BY MOUTH TWICE DAILY  60 tablet  0  . famotidine (PEPCID) 40 MG tablet TAKE 1 TABLET BY MOUTH EVERY 12 HOURS  60 tablet  0  . furosemide (LASIX) 40 MG tablet Take 1.5 tablets (60 mg total) by mouth 2 (two) times  daily.  90 tablet  6  . losartan (COZAAR) 100 MG tablet Take 100 mg by mouth every morning.       [name] losartan (COZAAR) 100 MG tablet TAKE 1 TABLET BY MOUTH EVERY DAY IN PLACE OF AVAPRO  90 tablet  0  . metFORMIN (GLUCOPHAGE) 500 MG tablet Take 500 mg by mouth daily with breakfast.      . metFORMIN (GLUCOPHAGE) 500 MG tablet TAKE 1 TABLET BY MOUTH EVERY MORNING WITH BREAKFAST  30 tablet  0  . nitroGLYCERIN (NITROSTAT) 0.4 MG SL tablet Place 0.4 mg under the tongue every 5 (five) minutes as needed for chest pain. For chest pain      . potassium chloride SA (K-DUR,KLOR-CON) 20 MEQ tablet Take 1 tablet (20 mEq total) by mouth 2 (two) times daily.  60 tablet  6   No current facility-administered medications on  file prior to visit.    Review of Systems Constitutional: Negative for diaphoresis, activity change, appetite change or unexpected weight change.  HENT: Negative for hearing loss, ear pain, facial swelling, mouth sores and neck stiffness.   Eyes: Negative for pain, redness and visual disturbance.  Respiratory: Negative for shortness of breath and wheezing.   Cardiovascular: Negative for chest pain and palpitations.  Gastrointestinal: Negative for diarrhea, blood in stool, abdominal distention or other pain Genitourinary: Negative for hematuria, flank pain or change in urine volume.  Musculoskeletal: Negative for myalgias and joint swelling.  Skin: Negative for color change and wound.  Neurological: Negative for syncope and numbness. other than noted Hematological: Negative for adenopathy.  Psychiatric/Behavioral: Negative for hallucinations, self-injury, decreased concentration and agitation.      Objective:   Physical Exam BP 132/86  Pulse 54  Temp(Src) 98.3 F (36.8 C) (Oral)  Ht 6\' 1"  (1.854 m)  Wt 318 lb 8 oz (144.471 kg)  BMI 42.03 kg/m2  SpO2 95% VS noted,  Constitutional: Pt is oriented to person, place, and time. Appears well-developed and well-nourished.  Head: Normocephalic and atraumatic.  Right Ear: External ear normal.  Left Ear: External ear normal.  Nose: Nose normal.  Mouth/Throat: Oropharynx is clear and moist.  Eyes: Conjunctivae and EOM are normal. Pupils are equal, round, and reactive to light.  Neck: Normal range of motion. Neck supple. No JVD present. No tracheal deviation present.  Cardiovascular: Normal rate, regular rhythm, normal heart sounds and intact distal pulses.   Pulmonary/Chest: Effort normal and breath sounds normal.  Abdominal: Soft. Bowel sounds are normal. There is no tenderness. No HSM  Musculoskeletal: Normal range of motion. Exhibits no edema.  Lymphadenopathy:  Has no cervical adenopathy.  Neurological: Pt is alert and oriented to  person, place, and time. Pt has normal reflexes. No cranial nerve deficit.  Skin: Skin is warm and dry. No rash noted.  Psychiatric:  Has  normal mood and affect. Behavior is normal.     Assessment & Plan:

## 2013-06-26 NOTE — Progress Notes (Signed)
Pre-visit discussion using our clinic review tool. No additional management support is needed unless otherwise documented below in the visit note.  

## 2013-06-26 NOTE — Assessment & Plan Note (Signed)
Urged to quit 

## 2013-06-26 NOTE — Patient Instructions (Addendum)
You had the new Prevnar pneumonia shot today Please continue all other medications as before, and refills have been done if requested. Please have the pharmacy call with any other refills you may need. Please continue your efforts at being more active, low cholesterol diet, and weight control. You are otherwise up to date with prevention measures today. Please keep your appointments with your specialists as you have planned You will be contacted regarding the referral for: podiatry, cardiology, and neurology  Please go to the LAB in the Basement (turn left off the elevator) for the tests to be done today You will be contacted by phone if any changes need to be made immediately.  Otherwise, you will receive a letter about your results with an explanation, but please check with MyChart first.  Please remember to sign up for My Chart if you have not done so, as this will be important to you in the future with finding out test results, communicating by private email, and scheduling acute appointments online when needed.  Please stop smoking  Please return in 6 months, or sooner if needed, with Lab testing done 3-5 days before

## 2013-06-26 NOTE — Assessment & Plan Note (Signed)

## 2013-06-26 NOTE — Assessment & Plan Note (Signed)
Lab Results  Component Value Date   HGBA1C 6.6* 02/19/2013   stable overall by history and exam, recent data reviewed with pt, and pt to continue medical treatment as before,  to f/u any worsening symptoms or concerns

## 2013-06-26 NOTE — Assessment & Plan Note (Signed)
For neurology referral

## 2013-06-26 NOTE — Assessment & Plan Note (Signed)
For cardiology referral f/u

## 2013-06-29 ENCOUNTER — Other Ambulatory Visit (INDEPENDENT_AMBULATORY_CARE_PROVIDER_SITE_OTHER): Payer: Medicare HMO

## 2013-06-29 ENCOUNTER — Encounter: Payer: Self-pay | Admitting: Internal Medicine

## 2013-06-29 ENCOUNTER — Telehealth: Payer: Self-pay

## 2013-06-29 DIAGNOSIS — Z0000 Encounter for general adult medical examination without abnormal findings: Secondary | ICD-10-CM

## 2013-06-29 DIAGNOSIS — E119 Type 2 diabetes mellitus without complications: Secondary | ICD-10-CM

## 2013-06-29 LAB — URINALYSIS, ROUTINE W REFLEX MICROSCOPIC
Bilirubin Urine: NEGATIVE
HGB URINE DIPSTICK: NEGATIVE
Ketones, ur: NEGATIVE
Leukocytes, UA: NEGATIVE
Nitrite: NEGATIVE
RBC / HPF: NONE SEEN (ref 0–?)
SPECIFIC GRAVITY, URINE: 1.02 (ref 1.000–1.030)
URINE GLUCOSE: NEGATIVE
Urobilinogen, UA: 0.2 (ref 0.0–1.0)
pH: 5.5 (ref 5.0–8.0)

## 2013-06-29 LAB — CBC WITH DIFFERENTIAL/PLATELET
BASOS ABS: 0 10*3/uL (ref 0.0–0.1)
Basophils Relative: 0.4 % (ref 0.0–3.0)
EOS ABS: 0.2 10*3/uL (ref 0.0–0.7)
Eosinophils Relative: 3.3 % (ref 0.0–5.0)
HEMATOCRIT: 45.8 % (ref 39.0–52.0)
HEMOGLOBIN: 14.9 g/dL (ref 13.0–17.0)
Lymphocytes Relative: 57.9 % — ABNORMAL HIGH (ref 12.0–46.0)
Lymphs Abs: 3.8 10*3/uL (ref 0.7–4.0)
MCHC: 32.5 g/dL (ref 30.0–36.0)
MCV: 86.7 fl (ref 78.0–100.0)
MONO ABS: 0.7 10*3/uL (ref 0.1–1.0)
Monocytes Relative: 10.9 % (ref 3.0–12.0)
NEUTROS ABS: 1.8 10*3/uL (ref 1.4–7.7)
Neutrophils Relative %: 27.5 % — ABNORMAL LOW (ref 43.0–77.0)
PLATELETS: 181 10*3/uL (ref 150.0–400.0)
RBC: 5.29 Mil/uL (ref 4.22–5.81)
RDW: 16.4 % — AB (ref 11.5–14.6)
WBC: 6.5 10*3/uL (ref 4.5–10.5)

## 2013-06-29 LAB — BASIC METABOLIC PANEL
BUN: 15 mg/dL (ref 6–23)
CALCIUM: 9.5 mg/dL (ref 8.4–10.5)
CO2: 29 mEq/L (ref 19–32)
Chloride: 107 mEq/L (ref 96–112)
Creatinine, Ser: 1.1 mg/dL (ref 0.4–1.5)
GFR: 86.21 mL/min (ref >60.00)
Glucose, Bld: 75 mg/dL (ref 70–99)
POTASSIUM: 4.2 meq/L (ref 3.5–5.1)
SODIUM: 143 meq/L (ref 135–145)

## 2013-06-29 LAB — TSH: TSH: 1.15 u[IU]/mL (ref 0.35–5.50)

## 2013-06-29 LAB — HEPATIC FUNCTION PANEL
ALK PHOS: 65 U/L (ref 39–117)
ALT: 19 U/L (ref 0–53)
AST: 16 U/L (ref 0–37)
Albumin: 3.9 g/dL (ref 3.5–5.2)
BILIRUBIN DIRECT: 0.1 mg/dL (ref 0.0–0.3)
Total Bilirubin: 0.5 mg/dL (ref 0.3–1.2)
Total Protein: 7 g/dL (ref 6.0–8.3)

## 2013-06-29 LAB — MICROALBUMIN / CREATININE URINE RATIO
Creatinine,U: 69.4 mg/dL
MICROALB UR: 19.8 mg/dL — AB (ref 0.0–1.9)
Microalb Creat Ratio: 28.6 mg/g (ref 0.0–30.0)

## 2013-06-29 LAB — LIPID PANEL
CHOL/HDL RATIO: 3
Cholesterol: 130 mg/dL (ref 0–200)
HDL: 41 mg/dL (ref >39.00)
LDL Cholesterol: 76 mg/dL (ref 0–99)
Triglycerides: 65 mg/dL (ref 0.0–149.0)
VLDL: 13 mg/dL (ref 0.0–40.0)

## 2013-06-29 LAB — HEMOGLOBIN A1C: HEMOGLOBIN A1C: 6.6 % — AB (ref 4.6–6.5)

## 2013-06-29 LAB — PSA: PSA: 1.02 ng/mL (ref 0.10–4.00)

## 2013-06-29 NOTE — Telephone Encounter (Signed)
Relevant patient education mailed to patient.  

## 2013-07-02 ENCOUNTER — Other Ambulatory Visit: Payer: Self-pay | Admitting: Cardiovascular Disease

## 2013-07-11 ENCOUNTER — Other Ambulatory Visit: Payer: Self-pay | Admitting: Cardiovascular Disease

## 2013-07-16 ENCOUNTER — Other Ambulatory Visit (HOSPITAL_COMMUNITY): Payer: Self-pay | Admitting: Cardiovascular Disease

## 2013-07-20 ENCOUNTER — Other Ambulatory Visit (HOSPITAL_COMMUNITY): Payer: Self-pay | Admitting: Internal Medicine

## 2013-07-30 ENCOUNTER — Other Ambulatory Visit: Payer: Self-pay | Admitting: Cardiovascular Disease

## 2013-08-10 ENCOUNTER — Ambulatory Visit (INDEPENDENT_AMBULATORY_CARE_PROVIDER_SITE_OTHER): Payer: Commercial Managed Care - HMO | Admitting: Cardiovascular Disease

## 2013-08-10 ENCOUNTER — Encounter: Payer: Self-pay | Admitting: Cardiovascular Disease

## 2013-08-10 VITALS — BP 126/64 | HR 53 | Ht 73.0 in | Wt 323.0 lb

## 2013-08-10 DIAGNOSIS — I2510 Atherosclerotic heart disease of native coronary artery without angina pectoris: Secondary | ICD-10-CM

## 2013-08-10 NOTE — Progress Notes (Signed)
HPI:  58 year old gentleman presenting for followup evaluation. He has a hx of CAD, s/p PCI to the RCA and PL1, cutting balloon angioplasty secondary to in-stent restenosis of the RCA in 5/08, ischemic cardiomyopathy with previous EF 40%, improved to 55-60%, HTN, HL. Myoview 9/12: mod scar in base/mid inf and IL segments, slight peri-infarct ischemia - low risk (med Rx continued). He was admitted 3/14 with an anterior STEMI treated with a Promus DES to the LAD. He did have residual disease in the circumflex with 70-80% in the PL1 branch prior to the previous stent (stent was patent), continuation of AV groove circumflex 95%, mid RCA stent 40% (proximal portion) and 70% (more distal portion). Medical therapy was recommended. EF was 35-40%. Follow up echo 09/01/12: Inferobasal HK, moderate LVH, EF 50-55%, mild LAE.  Last lipids 06/29/2013 showed cholesterol 130, triglycerides 65, HDL 41, and LDL 76.  The patient feels "tired all the time." He has chronic shortness of breath with exertion but is not particularly limited by this. He denies orthopnea, PND, or chest pain/pressure. He's had no recent leg swelling. He still smoking but down to about 4 or 5 cigarettes daily. He's been compliant with his medications. He reports no bleeding problems on dual antiplatelet therapy with aspirin and Plavix.   Outpatient Encounter Prescriptions as of 08/10/2013  Medication Sig  . amLODipine (NORVASC) 10 MG tablet Take 1 tablet (10 mg total) by mouth daily.  [name] aspirin 81 MG chewable tablet Chew 81 mg by mouth every morning.  . carvedilol (COREG) 25 MG tablet TAKE 1 TABLET BY MOUTH TWICE DAILY  . citalopram (CELEXA) 20 MG tablet Take 10 mg by mouth at bedtime.  . cloNIDine (CATAPRES) 0.2 MG tablet TAKE 1 TABLET BY MOUTH TWICE DAILY  . clopidogrel (PLAVIX) 75 MG tablet Take 1 tablet (75 mg total) by mouth every morning.  . famotidine (PEPCID) 40 MG tablet TAKE 1 TABLET BY MOUTH EVERY 12 HOURS  . furosemide (LASIX) 40  MG tablet Take 1.5 tablets (60 mg total) by mouth 2 (two) times daily.  [name] losartan (COZAAR) 100 MG tablet TAKE 1 TABLET BY MOUTH EVERY DAY IN PLACE OF AVAPRO  . metFORMIN (GLUCOPHAGE) 500 MG tablet TAKE 1 TABLET BY MOUTH EVERY MORNING WITH BREAKFAST  . nitroGLYCERIN (NITROSTAT) 0.4 MG SL tablet Place 0.4 mg under the tongue every 5 (five) minutes as needed for chest pain. For chest pain  . potassium chloride SA (K-DUR,KLOR-CON) 20 MEQ tablet Take 1 tablet (20 mEq total) by mouth 2 (two) times daily.  . pravastatin (PRAVACHOL) 80 MG tablet Take 1 tablet (80 mg total) by mouth every evening.  . [DISCONTINUED] amLODipine (NORVASC) 10 MG tablet Take 1 tablet (10 mg total) by mouth every morning.  . [DISCONTINUED] losartan (COZAAR) 100 MG tablet Take 100 mg by mouth every morning.   . [DISCONTINUED] metFORMIN (GLUCOPHAGE) 500 MG tablet Take 500 mg by mouth daily with breakfast.    Allergies  Allergen Reactions  . Penicillins Other (See Comments)    Unknown childhood reaction    Past Medical History  Diagnosis Date  . Dyslipidemia   . HTN (hypertension)   . CAD (coronary artery disease)     a. s/p PCI/BMS pRCA 2002; b. PCI pRCA & DES p/m RCA 2006; c. PCI/DES OM4 2007; d. PCI/CBA to RCA for ISR 10/2006; e.08/2012 STEMI/Cath/PCI: LM nl, LAD95-41m (3.0x20 Promus Prem DES), 95/95 ap LAD (1.0-1.65mm), LCX 30p 95d (sm), LPL1 70-80p, patent stent, LPL2 nl, RCA 30p, 40/70  ISR, EF 35-40%.  . Ischemic cardiomyopathy     a. EF 40%; improved to normal;  b. 08/2012 Echo: EF 50-55%, mod LVH.  [name] GERD (gastroesophageal reflux disease)   . Tobacco abuse   . Obesity   . OSA (obstructive sleep apnea)     Does not use CPAP as of 05/2011  . Depression   . History of pneumonia   . Diabetes mellitus     a. A1c 8.8 08/2012->Metformin initiated. => b. A1c (9/14): 6.6  . Hyperlipidemia     ROS: Negative except as per HPI  BP 126/64  Pulse 53  Ht 6\' 1"  (1.854 m)  Wt 323 lb (146.512 kg)  BMI 42.62  kg/m2  PHYSICAL EXAM: Pt is alert and oriented, obese male in NAD HEENT: normal Neck: JVP - normal, carotids 2+= without bruits Lungs: CTA bilaterally CV: RRR without murmur or gallop Abd: soft, NT, Positive BS, no hepatomegaly Ext: no C/C/E, distal pulses intact and equal Skin: warm/dry no rash  EKG:  Sinus bradycardia 53 beats per minute, left ventricular hypertrophy, early repolarization.  ASSESSMENT AND PLAN: 1. Coronary artery disease, native vessel. The patient appears to be stable. He is having no symptoms like those of his prior angina. He will continue on his current medical program. I will schedule him to come back and see [name] in 6 months.   2. Essential hypertension. Blood pressure is controlled on multidrug therapy with amlodipine, carvedilol, clonidine, and losartan.  3. Lipids reviewed as above. The patient continues on pravastatin 80 mg daily.  4. Tobacco abuse. Cessation counseling was done. He's had multiple ACS events and understands the role of cigarettes.  5. Obesity. The patient was counseled regarding diet and exercise.  For followup he'll return in 6 months with [name].  Sherren Mocha 08/10/2013 10:08 AM

## 2013-08-10 NOTE — Patient Instructions (Signed)
Your physician recommends that you continue on your current medications as directed. Please refer to the Current Medication list given to you today.  Your physician wants you to follow-up in: 6 months with [name], PA-C.  You will receive a reminder letter in the mail two months in advance. If you don't receive a letter, please call our office to schedule the follow-up appointment.

## 2013-08-13 ENCOUNTER — Ambulatory Visit: Payer: Self-pay | Admitting: Podiatrist

## 2013-08-13 ENCOUNTER — Other Ambulatory Visit: Payer: Self-pay | Admitting: Cardiovascular Disease

## 2013-08-26 ENCOUNTER — Ambulatory Visit (INDEPENDENT_AMBULATORY_CARE_PROVIDER_SITE_OTHER): Payer: Medicare HMO | Admitting: Podiatrist

## 2013-08-26 ENCOUNTER — Encounter: Payer: Self-pay | Admitting: Podiatrist

## 2013-08-26 VITALS — BP 186/72 | HR 64 | Resp 12

## 2013-08-26 DIAGNOSIS — B351 Tinea unguium: Secondary | ICD-10-CM

## 2013-08-26 DIAGNOSIS — E1140 Type 2 diabetes mellitus with diabetic neuropathy, unspecified: Secondary | ICD-10-CM

## 2013-08-26 DIAGNOSIS — E1149 Type 2 diabetes mellitus with other diabetic neurological complication: Secondary | ICD-10-CM

## 2013-08-26 DIAGNOSIS — Q828 Other specified congenital malformations of skin: Secondary | ICD-10-CM

## 2013-08-26 DIAGNOSIS — E1142 Type 2 diabetes mellitus with diabetic polyneuropathy: Secondary | ICD-10-CM

## 2013-08-26 DIAGNOSIS — M216X9 Other acquired deformities of unspecified foot: Secondary | ICD-10-CM

## 2013-08-26 DIAGNOSIS — M79609 Pain in unspecified limb: Secondary | ICD-10-CM

## 2013-08-26 NOTE — Progress Notes (Signed)
   Subjective:    Patient ID: [name] [name], male    DOB: 1956/03/30, 58 y.o.   MRN: [id number]  HPI PT STATED BALL OF THE FEET HAVE CALLUS AND THEY HURT FOR ABOUT 1 YEAR. THE CALLUS GET WORSE AND THICKER SKIN. TRIED TO KEEP THEM TRIM.    Review of Systems  Musculoskeletal: Positive for back pain.       Objective:   Physical Exam GENERAL APPEARANCE: Alert, conversant. Appropriately groomed. No acute distress.  VASCULAR: Pedal pulses palpable at 2/4 DP and PT right 1/4 dp and pt left.  Capillary refill time is immediate to all digits,  Proximal to distal cooling it warm to warm.  Digital hair growth is present bilateral  NEUROLOGIC: sensation is intact epicritically and protectively to 5.07 monofilament at 5/5 sites right and 2/5 sites left.  Light touch is intact bilateral, vibratory sensation intact bilateral, achilles tendon reflex is intact bilateral.  MUSCULOSKELETAL: high arched foot type with prominent metatarsals are present.  Contracture of lesser digits 2-5 is noted.   DERMATOLOGIC: porokeratotic/ hyperkeratotic lesions are present on the Left foot submet 2, and 5 and the tip of the left 2nd toe.  Right submet 2 only.  Lesions have a waxy plug and intact integument post debridement. Mycotic appearance of all 10 toenails present with discoloration, dystrophy and elongation present    Assessment & Plan:  .diabetes with neuropathy, prominent metatarsal heads, porokeratotic lesion x 4; ingrowing toenails x 10  Plan:  Debrided the lesions and toenails without complication.  Recommended diabetic shoes as a preventative for ulceration and to prevent limb loss-  Diabetic shoes deemed medically necessary to maintain good diabetic foot health.  Will follow up with patients primary care [name] [name] for the approval of shoes and will contact [name] [name] for measurement.  reappoint in 3 months for continued care.

## 2013-08-26 NOTE — Patient Instructions (Signed)
Diabetes and Foot Care Diabetes may cause you to have problems because of poor blood supply (circulation) to your feet and legs. This may cause the skin on your feet to become thinner, break easier, and heal more slowly. Your skin may become dry, and the skin may peel and crack. You may also have nerve damage in your legs and feet causing decreased feeling in them. You may not notice minor injuries to your feet that could lead to infections or more serious problems. Taking care of your feet is one of the most important things you can do for yourself.  HOME CARE INSTRUCTIONS  Wear shoes at all times, even in the house. Do not go barefoot. Bare feet are easily injured.  Check your feet daily for blisters, cuts, and redness. If you cannot see the bottom of your feet, use a mirror or ask someone for help.  Wash your feet with warm water (do not use hot water) and mild soap. Then pat your feet and the areas between your toes until they are completely dry. Do not soak your feet as this can dry your skin.  Apply a moisturizing lotion or petroleum jelly (that does not contain alcohol and is unscented) to the skin on your feet and to dry, brittle toenails. Do not apply lotion between your toes.  Trim your toenails straight across. Do not dig under them or around the cuticle. File the edges of your nails with an emery board or nail file.  Do not cut corns or calluses or try to remove them with medicine.  Wear clean socks or stockings every day. Make sure they are not too tight. Do not wear knee-high stockings since they may decrease blood flow to your legs.  Wear shoes that fit properly and have enough cushioning. To break in new shoes, wear them for just a few hours a day. This prevents you from injuring your feet. Always look in your shoes before you put them on to be sure there are no objects inside.  Do not cross your legs. This may decrease the blood flow to your feet.  If you find a minor scrape,  cut, or break in the skin on your feet, keep it and the skin around it clean and dry. These areas may be cleansed with mild soap and water. Do not cleanse the area with peroxide, alcohol, or iodine.  When you remove an adhesive bandage, be sure not to damage the skin around it.  If you have a wound, look at it several times a day to make sure it is healing.  Do not use heating pads or hot water bottles. They may burn your skin. If you have lost feeling in your feet or legs, you may not know it is happening until it is too late.  Make sure your health care provider performs a complete foot exam at least annually or more often if you have foot problems. Report any cuts, sores, or bruises to your health care provider immediately. SEEK MEDICAL CARE IF:   You have an injury that is not healing.  You have cuts or breaks in the skin.  You have an ingrown nail.  You notice redness on your legs or feet.  You feel burning or tingling in your legs or feet.  You have pain or cramps in your legs and feet.  Your legs or feet are numb.  Your feet always feel cold. SEEK IMMEDIATE MEDICAL CARE IF:   There is increasing redness,   swelling, or pain in or around a wound.  There is a red line that goes up your leg.  Pus is coming from a wound.  You develop a fever or as directed by your health care provider.  You notice a bad smell coming from an ulcer or wound. Document Released: 05/25/2000 Document Revised: 01/28/2013 Document Reviewed: 11/04/2012 [employer] Patient Information 2014 [employer].  

## 2013-08-28 ENCOUNTER — Other Ambulatory Visit: Payer: Self-pay | Admitting: Cardiovascular Disease

## 2013-08-31 ENCOUNTER — Other Ambulatory Visit: Payer: Self-pay | Admitting: Cardiovascular Disease

## 2013-09-02 ENCOUNTER — Ambulatory Visit (INDEPENDENT_AMBULATORY_CARE_PROVIDER_SITE_OTHER): Payer: Medicare HMO | Admitting: Neurology

## 2013-09-02 ENCOUNTER — Encounter: Payer: Self-pay | Admitting: Neurology

## 2013-09-02 VITALS — BP 104/76 | HR 51 | Ht 74.8 in | Wt 332.0 lb

## 2013-09-02 DIAGNOSIS — R51 Headache: Secondary | ICD-10-CM

## 2013-09-02 DIAGNOSIS — R519 Headache, unspecified: Secondary | ICD-10-CM

## 2013-09-02 DIAGNOSIS — Q6689 Other  specified congenital deformities of feet: Secondary | ICD-10-CM

## 2013-09-02 DIAGNOSIS — R269 Unspecified abnormalities of gait and mobility: Secondary | ICD-10-CM

## 2013-09-02 DIAGNOSIS — Q667 Congenital pes cavus, unspecified foot: Secondary | ICD-10-CM

## 2013-09-02 DIAGNOSIS — G609 Hereditary and idiopathic neuropathy, unspecified: Secondary | ICD-10-CM

## 2013-09-02 DIAGNOSIS — Q742 Other congenital malformations of lower limb(s), including pelvic girdle: Secondary | ICD-10-CM

## 2013-09-02 NOTE — Progress Notes (Signed)
a [facility] Clinic Note - Initial Visit   Date: 09/02/2013    [name] [name] MRN: [id number] DOB: [birth year]   Dear Dr [name]:  Thank you for your kind referral of [name] [name] for consultation of gait abnormality. Although his history is well known to you, please allow [place] to reiterate it for the purpose of our medical record. The patient was accompanied to the clinic by self.    History of Present Illness: [name] [name] is a 58 y.o. left-handed African American male with history of hypertension, hyperlipidemia, CAD s/p PCI/BMS, GERD, tobacco use, OSA (noncompliant with CPAP), depression, and diabetes mellitus (HbA1c 6.6) presenting for evaluation of gait abnormality.  Starting early 2015, he noticed problems with walking, described as unsteadiness and staggering gait.  He has twice this year.  His first fall occurred in the kitchen and he was turning, but his feet did not move and he fell to the ground.  The second fall occurred while standing in a doorway and was looking down to the floor and then noticed that his entire body unintentionally leaned into the door frame.  Denies any preceding vision changes, palpitations, or lightheadedness.  He also reports having a fall ~2 years ago when he was walking and his feet simply stopped and he fell forward.  He is able to help himself up and has only had superficial abrasions.    He started using a cane this year because he feels more secure with it.    He also reports noticing an intermittent mild head tremor occuring at rest.  He has not noticed any tremor of the hands.  He endorses constipation.  No loss of smell or hallucination.  No numbness/tingling, back pain, or weakness.  He also complains of dull morning headaches and memory problems.    Upon further questioning, he says that his mother used to fall a lot and was clumsy.    Out-side paper records, electronic medical record, and images have been reviewed  where available and summarized as:  CT/A head 09/15/2011:  No significant intracranial stenosis. CT head 02/14/2008:  No acute intracranial abnormalities. CT cervical spine 02/14/2008:  No acute findings  Lab Results  Component Value Date   TSH 1.15 06/29/2013    Lab Results  Component Value Date   HGBA1C 6.6* 06/29/2013     Past Medical History  Diagnosis Date  . Dyslipidemia   . HTN (hypertension)   . CAD (coronary artery disease)     a. s/p PCI/BMS pRCA 2002; b. PCI pRCA & DES p/m RCA 2006; c. PCI/DES OM4 2007; d. PCI/CBA to RCA for ISR 10/2006; e.08/2012 STEMI/Cath/PCI: LM nl, LAD95-8m (3.0x20 Promus Prem DES), 95/95 ap LAD (1.0-1.42mm), LCX 30p 95d (sm), LPL1 70-80p, patent stent, LPL2 nl, RCA 30p, 40/70 ISR, EF 35-40%.  . Ischemic cardiomyopathy     a. EF 40%; improved to normal;  b. 08/2012 Echo: EF 50-55%, mod LVH.  [name] GERD (gastroesophageal reflux disease)   . Tobacco abuse   . Obesity   . OSA (obstructive sleep apnea)     Does not use CPAP as of 05/2011  . Depression   . History of pneumonia   . Diabetes mellitus     a. A1c 8.8 08/2012->Metformin initiated. => b. A1c (9/14): 6.6  . Hyperlipidemia     Past Surgical History  Procedure Laterality Date  . Coronary stent placement  2009     Medications:  Current Outpatient Prescriptions on File Prior to Visit  Medication Sig Dispense Refill  . amLODipine (NORVASC) 10 MG tablet Take 1 tablet (10 mg total) by mouth daily.  90 tablet  3  . aspirin 81 MG chewable tablet Chew 81 mg by mouth every morning.      . carvedilol (COREG) 25 MG tablet TAKE 1 TABLET BY MOUTH TWICE DAILY  60 tablet  5  . citalopram (CELEXA) 20 MG tablet Take 10 mg by mouth at bedtime.      . cloNIDine (CATAPRES) 0.2 MG tablet TAKE 1 TABLET BY MOUTH TWICE DAILY  60 tablet  5  . clopidogrel (PLAVIX) 75 MG tablet Take 1 tablet (75 mg total) by mouth every morning.  90 tablet  3  . famotidine (PEPCID) 40 MG tablet TAKE 1 TABLET BY MOUTH EVERY 12 HOURS  60  tablet  11  . furosemide (LASIX) 40 MG tablet Take 1.5 tablets (60 mg total) by mouth 2 (two) times daily.  90 tablet  6  . losartan (COZAAR) 100 MG tablet TAKE 1 TABLET BY MOUTH EVERY DAY  90 tablet  0  . metFORMIN (GLUCOPHAGE) 500 MG tablet TAKE 1 TABLET BY MOUTH EVERY MORNING WITH BREAKFAST  30 tablet  11  . nitroGLYCERIN (NITROSTAT) 0.4 MG SL tablet Place 0.4 mg under the tongue every 5 (five) minutes as needed for chest pain. For chest pain      . potassium chloride SA (K-DUR,KLOR-CON) 20 MEQ tablet Take 1 tablet (20 mEq total) by mouth 2 (two) times daily.  60 tablet  6  . pravastatin (PRAVACHOL) 80 MG tablet Take 1 tablet (80 mg total) by mouth every evening.  90 tablet  3   No current facility-administered medications on file prior to visit.    Allergies:  Allergies  Allergen Reactions  . Penicillins Other (See Comments)    Unknown childhood reaction    Family History: Family History  Problem Relation Age of Onset  . Coronary artery disease    . Depression Mother   . Cancer Mother     ovarian  . Hypertension Mother   . Other Father     motor vehicle accident    Social History: History   Social History  . Marital Status: Single    Spouse Name: N/A    Number of Children: 0  . Years of Education: N/A   Occupational History  . retired    Social History Main Topics  . Smoking status: Current Every Day Smoker -- 0.75 packs/day for 30 years    Types: Cigarettes  . Smokeless tobacco: Never Used  . Alcohol Use: No  . Drug Use: No  . Sexual Activity: Not on file   Other Topics Concern  . Not on file   Social History Narrative  . No narrative on file    Review of Systems:  CONSTITUTIONAL: No fevers, chills, night sweats, or weight loss.   EYES: No visual changes or eye pain ENT: No hearing changes.  No history of nose bleeds.   RESPIRATORY: No cough, wheezing + shortness of breath.   CARDIOVASCULAR: Negative for chest pain, and palpitations.   GI: Negative  for abdominal discomfort, blood in stools or black stools.  No recent change in bowel habits.   GU:  No history of incontinence.   MUSCLOSKELETAL: No history of joint pain or swelling.  No myalgias.   SKIN: Negative for lesions, rash, and itching.   HEMATOLOGY/ONCOLOGY: Negative for prolonged bleeding, bruising easily, and swollen nodes.    ENDOCRINE: Negative for  cold or heat intolerance, polydipsia or goiter.   PSYCH:  No depression or anxiety symptoms.   NEURO: As Above.   Vital Signs:  BP 104/76  Pulse 51  Ht 6' 2.8" (1.9 m)  Wt 332 lb (150.594 kg)  BMI 41.72 kg/m2  SpO2 93% Pain Scale: 0 on a scale of 0-10   General Medical Exam:   General:  Large body stature, well appearing, comfortable.   Eyes/ENT: see cranial nerve examination.   Neck: No masses appreciated.  Full range of motion without tenderness.  No carotid bruits. Respiratory:  Clear to auscultation, good air entry bilaterally.   Cardiac:  Regular rate and rhythm, no murmur.    Back:  No pain to palpation of spinous processes.   Extremities:  Bilateral pes cavus with 2-5th toes with marked hammer toe deformity bilaterally. Skin:  Skin color, texture, turgor normal. No rashes or lesions.  Neurological Exam: MENTAL STATUS including orientation to time, place, person, recent and remote memory, attention span and concentration, language, and fund of knowledge is normal.  Speech is not dysarthric.  CRANIAL NERVES: II:  No visual field defects.  Unremarkable fundi.   III-IV-VI: Pupils equal round and reactive to light.  Normal conjugate, extra-ocular eye movements in all directions of gaze.  No nystagmus.  No ptosis.   V:  Normal facial sensation.   VII:  Normal facial symmetry and movements.   VIII:  Normal hearing and vestibular function.   IX-X:  Normal palatal movement.   XI:  Normal shoulder shrug and head rotation.   XII:  Normal tongue strength and range of motion, no deviation or fasciculation.  MOTOR:  No  atrophy, fasciculations or abnormal movements.  No pronator drift.  Tone is normal.    Right Upper Extremity:    Left Upper Extremity:    Deltoid  5/5   Deltoid  5/5   Biceps  5/5   Biceps  5/5   Triceps  5/5   Triceps  5/5   Wrist extensors  5/5   Wrist extensors  5/5   Wrist flexors  5/5   Wrist flexors  5/5   Finger extensors  5/5   Finger extensors  5/5   Finger flexors  5/5   Finger flexors  5/5   Dorsal interossei  5/5   Dorsal interossei  5/5   Abductor pollicis  5/5   Abductor pollicis  5/5   Tone (Ashworth scale)  0  Tone (Ashworth scale)  0   Right Lower Extremity:    Left Lower Extremity:    Hip flexors  5/5   Hip flexors  5/5   Hip extensors  5/5   Hip extensors  5/5   Knee flexors  5/5   Knee flexors  5/5   Knee extensors  5/5   Knee extensors  5/5   Dorsiflexors  5/5   Dorsiflexors  5/5   Plantarflexors  5/5   Plantarflexors  5/5   Toe extensors  5/5   Toe extensors  5/5   Toe flexors  5/5   Toe flexors  5/5   Tone (Ashworth scale)  0  Tone (Ashworth scale)  0   MSRs:  Right  Left brachioradialis 2+  brachioradialis 2+  biceps 2+  biceps 2+  triceps 2+  triceps 2+  patellar 2+  patellar 2+  ankle jerk trace  ankle jerk trace  Hoffman no  Hoffman no  plantar response down  plantar response down    SENSORY:  Vibration, pin prick, and temperature is reduced distal to ankles bilaterally.  Light touch and proprioception is intact.  Romberg's sign positive   COORDINATION/GAIT: Normal finger-to- nose-finger and heel-to-shin.  Intact rapid alternating movements bilaterally.  Able to rise from a chair without using arms.  Slight gait ataxia with walking.  He is unsteady with tandem and stressed gait.   IMPRESSION: Mr. Hedgepath is a 58 year-old male presenting with gait abnormality.  His examination shows distal and symmetric pattern of sensory loss involving the feet suggestive of peripheral neuropathy.   Additionally, there is also pes cavus and hammer toe deformity which can be seen in patients with hereditary neuropathy such as Charcot Marie Tooth.  EMG will be ordered to differentiate acquired vs congenital neuropathy and I will also screen for labs for treatable causes of neuropathy.  He also complains of new morning headaches and there is evidence of gait ataxia on exam, so for completeness, MRI brain looking for posterior fossa abnormalities will also be ordered.    PLAN/RECOMMENDATIONS:  1.  Check vitamin B12, copper, UPEP/SPEP with IFE, vitamin E 2.  MRI brain without contrast 3.  EMG of the left side - 90 min 4.  Physical therapy for gait training 5.  Fall precautions discussed 6.  Return to clinic in 6 weeks   The duration of this appointment visit was 50 minutes of face-to-face time with the patient.  Greater than 50% of this time was spent in counseling, explanation of diagnosis, planning of further management, and coordination of care.   Thank you for allowing me to participate in patient's care.  If I can answer any additional questions, I would be pleased to do so.    Sincerely,    Jahmeek Shirk K. Posey Pronto, DO

## 2013-09-02 NOTE — Patient Instructions (Addendum)
1.  Check blood work today 2.  MRI brain without contrast 3.  EMG of the left side - 90 min 4.  Physical therapy for gait training 5.  Return to clinic in 6 weeks

## 2013-09-03 LAB — VITAMIN B12: Vitamin B-12: 305 pg/mL (ref 211–911)

## 2013-09-05 LAB — COPPER, SERUM: Copper: 115 ug/dL (ref 70–175)

## 2013-09-07 LAB — SPEP & IFE WITH QIG
ALPHA-2-GLOBULIN: 12.4 % — AB (ref 7.1–11.8)
Albumin ELP: 55.7 % — ABNORMAL LOW (ref 55.8–66.1)
Alpha-1-Globulin: 6.7 % — ABNORMAL HIGH (ref 2.9–4.9)
BETA GLOBULIN: 5.7 % (ref 4.7–7.2)
Beta 2: 4.8 % (ref 3.2–6.5)
Gamma Globulin: 14.7 % (ref 11.1–18.8)
IgA: 235 mg/dL (ref 68–379)
IgG (Immunoglobin G), Serum: 1050 mg/dL (ref 650–1600)
IgM, Serum: 27 mg/dL — ABNORMAL LOW (ref 41–251)
TOTAL PROTEIN, SERUM ELECTROPHOR: 6.6 g/dL (ref 6.0–8.3)

## 2013-09-07 LAB — UIFE/LIGHT CHAINS/TP QN, 24-HR UR
ALPHA 1 UR: DETECTED — AB
Albumin, U: DETECTED
Alpha 2, Urine: DETECTED — AB
BETA UR: DETECTED — AB
FREE LAMBDA LT CHAINS, UR: 0.17 mg/dL (ref 0.02–0.67)
Free Kappa Lt Chains,Ur: 0.71 mg/dL (ref 0.14–2.42)
Free Kappa/Lambda Ratio: 4.18 ratio (ref 2.04–10.37)
GAMMA UR: DETECTED — AB
Total Protein, Urine: 17.9 mg/dL

## 2013-09-07 LAB — VITAMIN E
GAMMA-TOCOPHEROL (VIT E): 0.9 mg/L (ref <=4.3)
Vitamin E (Alpha Tocopherol): 6.9 mg/L (ref 5.7–19.9)

## 2013-09-10 ENCOUNTER — Ambulatory Visit: Payer: Medicare HMO | Admitting: Physical Therapy

## 2013-09-15 ENCOUNTER — Ambulatory Visit: Payer: Medicare HMO | Attending: Internal Medicine | Admitting: Physical Therapy

## 2013-09-15 DIAGNOSIS — R269 Unspecified abnormalities of gait and mobility: Secondary | ICD-10-CM | POA: Diagnosis not present

## 2013-09-15 DIAGNOSIS — IMO0001 Reserved for inherently not codable concepts without codable children: Secondary | ICD-10-CM | POA: Diagnosis not present

## 2013-09-15 DIAGNOSIS — G609 Hereditary and idiopathic neuropathy, unspecified: Secondary | ICD-10-CM | POA: Insufficient documentation

## 2013-09-21 ENCOUNTER — Ambulatory Visit (HOSPITAL_COMMUNITY): Admission: RE | Admit: 2013-09-21 | Payer: Medicare HMO | Source: Ambulatory Visit

## 2013-09-28 ENCOUNTER — Emergency Department (HOSPITAL_COMMUNITY): Payer: Medicare HMO

## 2013-09-28 ENCOUNTER — Emergency Department (HOSPITAL_COMMUNITY)
Admission: EM | Admit: 2013-09-28 | Discharge: 2013-09-28 | Disposition: A | Discharge status: Home / Self Care | Payer: Medicare HMO | Attending: Emergency Medicine | Admitting: Emergency Medicine

## 2013-09-28 ENCOUNTER — Encounter (HOSPITAL_COMMUNITY): Payer: Self-pay | Admitting: Emergency Medicine

## 2013-09-28 DIAGNOSIS — F329 Major depressive disorder, single episode, unspecified: Secondary | ICD-10-CM | POA: Insufficient documentation

## 2013-09-28 DIAGNOSIS — R0600 Dyspnea, unspecified: Secondary | ICD-10-CM

## 2013-09-28 DIAGNOSIS — Z8701 Personal history of pneumonia (recurrent): Secondary | ICD-10-CM | POA: Insufficient documentation

## 2013-09-28 DIAGNOSIS — Z8669 Personal history of other diseases of the nervous system and sense organs: Secondary | ICD-10-CM | POA: Insufficient documentation

## 2013-09-28 DIAGNOSIS — I2510 Atherosclerotic heart disease of native coronary artery without angina pectoris: Secondary | ICD-10-CM | POA: Insufficient documentation

## 2013-09-28 DIAGNOSIS — E119 Type 2 diabetes mellitus without complications: Secondary | ICD-10-CM | POA: Insufficient documentation

## 2013-09-28 DIAGNOSIS — E785 Hyperlipidemia, unspecified: Secondary | ICD-10-CM | POA: Insufficient documentation

## 2013-09-28 DIAGNOSIS — K219 Gastro-esophageal reflux disease without esophagitis: Secondary | ICD-10-CM | POA: Insufficient documentation

## 2013-09-28 DIAGNOSIS — R0989 Other specified symptoms and signs involving the circulatory and respiratory systems: Principal | ICD-10-CM | POA: Insufficient documentation

## 2013-09-28 DIAGNOSIS — Z880 Allergy status to penicillin: Secondary | ICD-10-CM | POA: Insufficient documentation

## 2013-09-28 DIAGNOSIS — I10 Essential (primary) hypertension: Secondary | ICD-10-CM | POA: Insufficient documentation

## 2013-09-28 DIAGNOSIS — Z7902 Long term (current) use of antithrombotics/antiplatelets: Secondary | ICD-10-CM | POA: Insufficient documentation

## 2013-09-28 DIAGNOSIS — F17200 Nicotine dependence, unspecified, uncomplicated: Secondary | ICD-10-CM | POA: Insufficient documentation

## 2013-09-28 DIAGNOSIS — F3289 Other specified depressive episodes: Secondary | ICD-10-CM | POA: Insufficient documentation

## 2013-09-28 DIAGNOSIS — R0609 Other forms of dyspnea: Secondary | ICD-10-CM | POA: Insufficient documentation

## 2013-09-28 DIAGNOSIS — Z79899 Other long term (current) drug therapy: Secondary | ICD-10-CM | POA: Insufficient documentation

## 2013-09-28 DIAGNOSIS — Z7982 Long term (current) use of aspirin: Secondary | ICD-10-CM | POA: Insufficient documentation

## 2013-09-28 LAB — CBC WITH DIFFERENTIAL/PLATELET
Basophils Absolute: 0 10*3/uL (ref 0.0–0.1)
Basophils Relative: 0 % (ref 0–1)
Eosinophils Absolute: 0.2 10*3/uL (ref 0.0–0.7)
Eosinophils Relative: 3 % (ref 0–5)
HEMATOCRIT: 42.7 % (ref 39.0–52.0)
HEMOGLOBIN: 14.4 g/dL (ref 13.0–17.0)
LYMPHS PCT: 53 % — AB (ref 12–46)
Lymphs Abs: 3.6 10*3/uL (ref 0.7–4.0)
MCH: 28.9 pg (ref 26.0–34.0)
MCHC: 33.7 g/dL (ref 30.0–36.0)
MCV: 85.6 fL (ref 78.0–100.0)
MONO ABS: 0.7 10*3/uL (ref 0.1–1.0)
Monocytes Relative: 10 % (ref 3–12)
NEUTROS ABS: 2.3 10*3/uL (ref 1.7–7.7)
Neutrophils Relative %: 34 % — ABNORMAL LOW (ref 43–77)
Platelets: 180 10*3/uL (ref 150–400)
RBC: 4.99 MIL/uL (ref 4.22–5.81)
RDW: 15.9 % — ABNORMAL HIGH (ref 11.5–15.5)
WBC: 6.8 10*3/uL (ref 4.0–10.5)

## 2013-09-28 LAB — BASIC METABOLIC PANEL
BUN: 17 mg/dL (ref 6–23)
CO2: 25 meq/L (ref 19–32)
CREATININE: 0.99 mg/dL (ref 0.50–1.35)
Calcium: 9.2 mg/dL (ref 8.4–10.5)
Chloride: 105 mEq/L (ref 96–112)
GFR calc non Af Amer: 90 mL/min — ABNORMAL LOW (ref >90)
Glucose, Bld: 117 mg/dL — ABNORMAL HIGH (ref 70–99)
POTASSIUM: 3.9 meq/L (ref 3.7–5.3)
Sodium: 142 mEq/L (ref 137–147)

## 2013-09-28 LAB — PRO B NATRIURETIC PEPTIDE: Pro B Natriuretic peptide (BNP): 244 pg/mL — ABNORMAL HIGH (ref 0–125)

## 2013-09-28 LAB — TROPONIN I: Troponin I: <0.3 ng/mL (ref <0.30)

## 2013-09-28 NOTE — ED Notes (Addendum)
Pt states shortness of breath since yesterday.  Pt states he has hx of CHF and he feels his legs and hands feel swollen.  Pt states he has also been more weak than normal  No cough congestion.  Pt takes lasix at home and has not missed doses.

## 2013-09-28 NOTE — ED Notes (Signed)
Patient transported to X-ray 

## 2013-09-28 NOTE — ED Provider Notes (Signed)
CSN: [id number]     Arrival date & time 09/28/13  3329 History   First MD Initiated Contact with Patient 09/28/13 [phone]     Chief Complaint  Patient presents with  . Shortness of Breath     (Consider location/radiation/quality/duration/timing/severity/associated sxs/prior Treatment) HPI..... shortness of breath or 24 hours. No anterior chest pain, fever, chills, cough. He feels slightly more edematous than usual. Patient has multiple health problems including hypertension, diabetes, obesity, hyperlipidemia, coronary artery disease, ischemic cardiomyopathy. Severity is mild. Patient is normally short of breath with exertion. No prolonged travel or immobility.  Past Medical History  Diagnosis Date  . Dyslipidemia   . HTN (hypertension)   . CAD (coronary artery disease)     a. s/p PCI/BMS pRCA 2002; b. PCI pRCA & DES p/m RCA 2006; c. PCI/DES OM4 2007; d. PCI/CBA to RCA for ISR 10/2006; e.08/2012 STEMI/Cath/PCI: LM nl, LAD95-65m (3.0x20 Promus Prem DES), 95/95 ap LAD (1.0-1.4mm), LCX 30p 95d (sm), LPL1 70-80p, patent stent, LPL2 nl, RCA 30p, 40/70 ISR, EF 35-40%.  . Ischemic cardiomyopathy     a. EF 40%; improved to normal;  b. 08/2012 Echo: EF 50-55%, mod LVH.  [name] GERD (gastroesophageal reflux disease)   . Tobacco abuse   . Obesity   . OSA (obstructive sleep apnea)     Does not use CPAP as of 05/2011  . Depression   . History of pneumonia   . Diabetes mellitus     a. A1c 8.8 08/2012->Metformin initiated. => b. A1c (9/14): 6.6  . Hyperlipidemia    Past Surgical History  Procedure Laterality Date  . Coronary stent placement  2009   Family History  Problem Relation Age of Onset  . Coronary artery disease    . Depression Mother   . Cancer Mother     ovarian  . Hypertension Mother     Died, 8  . Other Father     motor vehicle accident   History  Substance Use Topics  . Smoking status: Current Every Day Smoker -- 0.75 packs/day for 30 years    Types: Cigarettes  . Smokeless  tobacco: Never Used     Comment: trying to cut down  . Alcohol Use: No    Review of Systems  All other systems reviewed and are negative.     Allergies  Penicillins  Home Medications   Prior to Admission medications   Medication Sig Start Date End Date Taking? Authorizing Provider  amLODipine (NORVASC) 10 MG tablet Take 1 tablet (10 mg total) by mouth daily. 06/26/13  Yes Biagio Borg, MD  aspirin 81 MG chewable tablet Chew 81 mg by mouth every morning.   Yes Historical Provider, MD  carvedilol (COREG) 25 MG tablet TAKE 1 TABLET BY MOUTH TWICE DAILY 08/13/13  Yes Sherren Mocha, MD  citalopram (CELEXA) 20 MG tablet Take 10 mg by mouth at bedtime.   Yes Historical Provider, MD  cloNIDine (CATAPRES) 0.2 MG tablet TAKE 1 TABLET BY MOUTH TWICE DAILY 08/28/13  Yes Sherren Mocha, MD  clopidogrel (PLAVIX) 75 MG tablet Take 1 tablet (75 mg total) by mouth every morning. 06/26/13  Yes Biagio Borg, MD  Cyanocobalamin (VITAMIN B 12) 100 MCG LOZG Take 200 mcg by mouth daily.   Yes Historical Provider, MD  famotidine (PEPCID) 40 MG tablet TAKE 1 TABLET BY MOUTH EVERY 12 HOURS 07/20/13  Yes Biagio Borg, MD  furosemide (LASIX) 40 MG tablet Take 1.5 tablets (60 mg total) by mouth 2 (two) times  daily. 02/26/13  Yes Liliane Shi, PA-C  losartan (COZAAR) 100 MG tablet TAKE 1 TABLET BY MOUTH EVERY DAY 08/31/13  Yes Sherren Mocha, MD  metFORMIN (GLUCOPHAGE) 500 MG tablet TAKE 1 TABLET BY MOUTH EVERY MORNING WITH BREAKFAST 07/20/13  Yes Biagio Borg, MD  nitroGLYCERIN (NITROSTAT) 0.4 MG SL tablet Place 0.4 mg under the tongue every 5 (five) minutes as needed for chest pain. For chest pain 09/02/12  Yes Rogelia Mire, NP  potassium chloride SA (K-DUR,KLOR-CON) 20 MEQ tablet Take 1 tablet (20 mEq total) by mouth 2 (two) times daily. 02/26/13  Yes Scott Joylene Draft, PA-C  pravastatin (PRAVACHOL) 80 MG tablet Take 1 tablet (80 mg total) by mouth every evening. 06/26/13  Yes Biagio Borg, MD   BP 126/71  Pulse 51   Temp(Src) 98 F (36.7 C) (Oral)  Resp 20  SpO2 97% Physical Exam  Nursing note and vitals reviewed. Constitutional: He is oriented to person, place, and time. He appears well-developed and well-nourished.  Morbidly obese, no acute distress  HENT:  Head: Normocephalic and atraumatic.  Eyes: Conjunctivae and EOM are normal. Pupils are equal, round, and reactive to light.  Neck: Normal range of motion. Neck supple.  Cardiovascular: Normal rate, regular rhythm and normal heart sounds.   Pulmonary/Chest: Effort normal and breath sounds normal.  Abdominal: Soft. Bowel sounds are normal.  Musculoskeletal: Normal range of motion.  Neurological: He is alert and oriented to person, place, and time.  Skin: Skin is warm and dry.  2-3+ peripheral edema  Psychiatric: He has a normal mood and affect. His behavior is normal.    ED Course  Procedures (including critical care time) Labs Review Labs Reviewed  BASIC METABOLIC PANEL - Abnormal; Notable for the following:    Glucose, Bld 117 (*)    GFR calc non Af Amer 90 (*)    All other components within normal limits  CBC WITH DIFFERENTIAL - Abnormal; Notable for the following:    RDW 15.9 (*)    Neutrophils Relative % 34 (*)    Lymphocytes Relative 53 (*)    All other components within normal limits  PRO B NATRIURETIC PEPTIDE - Abnormal; Notable for the following:    Pro B Natriuretic peptide (BNP) 244.0 (*)    All other components within normal limits  TROPONIN I    Imaging Review Dg Chest 2 View  09/28/2013   CLINICAL DATA:  Short of breath and weakness.  Chills.  EXAM: CHEST  2 VIEW  COMPARISON:  01/20/2013  FINDINGS: Artifact overlies the chest. The heart is mildly enlarged. Coronary artery stents are visible. There are chronically prominent interstitial lung markings, but no evidence of infiltrate, mass, effusion or collapse. No significant bony finding.  IMPRESSION: No active disease evident. Cardiomegaly. Coronary artery stents.  Chronic interstitial lung markings.   Electronically Signed   By: Nelson Chimes M.D.   On: 09/28/2013 10:13     EKG Interpretation   Date/Time:  Monday September 28 2013 09:45:21 EDT Ventricular Rate:  52 PR Interval:  139 QRS Duration: 153 QT Interval:  466 QTC Calculation: 433 R Axis:   -34 Text Interpretation:  Sinus rhythm Right bundle branch block Baseline  wander in lead(s) V1 Confirmed by Jalayiah Bibian  MD, Corry Ihnen ([id number]) on 09/28/2013  9:50:02 AM      MDM   Final diagnoses:  Dyspnea    Patient is in no acute distress. Although he has multiple risk factors for cardiac disease, his workup  in the ED was good. He does not appear ill. EKG, troponin, chest x-ray all negative. Appointment made with his primary care Dr. for April 28 at 2 PM    Nat Christen, MD 09/28/13 1244

## 2013-09-28 NOTE — Discharge Instructions (Signed)
Elevate legs. Reduce salt in your diet. Exercise.  Lose weight.   Appt as above.  Call to confirm appt

## 2013-09-29 ENCOUNTER — Encounter: Payer: Medicare HMO | Admitting: Neurology

## 2013-09-29 ENCOUNTER — Telehealth: Payer: Self-pay | Admitting: Neurology

## 2013-09-29 NOTE — Telephone Encounter (Signed)
Pt no showed today's EMG study with Dr. Posey Pronto. No show letter mailed to Dr. Posey Pronto / Gayleen Orem.

## 2013-10-02 ENCOUNTER — Ambulatory Visit (HOSPITAL_COMMUNITY): Admission: RE | Admit: 2013-10-02 | Payer: Medicare HMO | Source: Ambulatory Visit

## 2013-10-06 ENCOUNTER — Ambulatory Visit: Payer: Medicare HMO | Admitting: Internal Medicine

## 2013-10-13 ENCOUNTER — Ambulatory Visit (INDEPENDENT_AMBULATORY_CARE_PROVIDER_SITE_OTHER): Payer: Commercial Managed Care - HMO | Admitting: Internal Medicine

## 2013-10-13 ENCOUNTER — Encounter: Payer: Self-pay | Admitting: Internal Medicine

## 2013-10-13 VITALS — BP 122/80 | HR 56 | Temp 98.6°F | Ht 74.0 in | Wt 338.0 lb

## 2013-10-13 DIAGNOSIS — F329 Major depressive disorder, single episode, unspecified: Secondary | ICD-10-CM

## 2013-10-13 DIAGNOSIS — R609 Edema, unspecified: Secondary | ICD-10-CM

## 2013-10-13 DIAGNOSIS — R0989 Other specified symptoms and signs involving the circulatory and respiratory systems: Secondary | ICD-10-CM

## 2013-10-13 DIAGNOSIS — F17200 Nicotine dependence, unspecified, uncomplicated: Secondary | ICD-10-CM

## 2013-10-13 DIAGNOSIS — R0609 Other forms of dyspnea: Secondary | ICD-10-CM | POA: Insufficient documentation

## 2013-10-13 DIAGNOSIS — R600 Localized edema: Secondary | ICD-10-CM

## 2013-10-13 DIAGNOSIS — F3289 Other specified depressive episodes: Secondary | ICD-10-CM

## 2013-10-13 DIAGNOSIS — G4733 Obstructive sleep apnea (adult) (pediatric): Secondary | ICD-10-CM

## 2013-10-13 MED ORDER — ESCITALOPRAM OXALATE 10 MG PO TABS: 10.0000 mg | ORAL_TABLET | Freq: Every day | ORAL | Status: DC

## 2013-10-13 MED ORDER — MOMETASONE FURO-FORMOTEROL FUM 100-5 MCG/ACT IN AERO: 2.0000 | INHALATION_SPRAY | Freq: Two times a day (BID) | RESPIRATORY_TRACT | Status: DC

## 2013-10-13 NOTE — Assessment & Plan Note (Signed)
?   Venous insuff vs other - for cont lasix as is, for compression stockings, leg elevation, low salt, wt loss

## 2013-10-13 NOTE — Progress Notes (Signed)
Pre visit review using our clinic review tool, if applicable. No additional management support is needed unless otherwise documented below in the visit note. 

## 2013-10-13 NOTE — Patient Instructions (Signed)
OK to try the [facility] sample at 2 puffs twice per day  OK to stop the celexa  Please take all new medication as prescribed - the lexapro at 10 mg per day  You will be contacted regarding the referral for: Lung testing (PFT's), and pulmonary referral  Please continue all other medications as before, and refills have been done if requested. Please have the pharmacy call with any other refills you may need.  Please continue your efforts at being more active, low cholesterol diet, and weight control.  You are given the compression stocking prescription  Please stop smoking

## 2013-10-13 NOTE — Assessment & Plan Note (Signed)
Verified nonsuicidal, for change celexa 10 to lexapro 10 , hopefully better tolerate and more effective

## 2013-10-13 NOTE — Assessment & Plan Note (Addendum)
Etiology unclear, suspect obesity is factor, cant r/o copd with hx of smoking, urged to quit smoking, wt loss, trial sample dulera 100 at 2 puffs bid (with rx if seems to help), for PFT's, and refer pulmonary for DOE and OSA  Note:  Total time for pt hx, exam, review of record with pt in the room, determination of diagnoses and plan for further eval and tx is > 40 min, with over 50% spent in coordination and counseling of patient

## 2013-10-13 NOTE — Assessment & Plan Note (Signed)
Urged to quit 

## 2013-10-13 NOTE — Progress Notes (Signed)
Subjective:    Patient ID: [name] [name], male    DOB: 1955-12-15, 58 y.o.   MRN: [id number]  HPI  Here to /fu ER visit for DOE/sob intermittent for 2 months, recent cxr without acute apr 20 in ER, and mar 2014 echo with EF 50-55%.  DOE some improved subjectively since apr 20, but still symptomatic at approx 50 ft. Also Has some mild bilateral LE swelling intermittent as well concurrent with the DOE possibly, worse to sit for a time, then better with leg elevation/overnight.  Smoker 1/2 ppd, ongoing since 57yo.    No prior hx of known copd, asthma.  Pt denies chest pain, wheezing, orthopnea, PND, palpitations, dizziness or syncope. Pt denies new neurological symptoms such as new headache, or facial or extremity weakness or numbness   Pt denies polydipsia, polyuria. Has gained several lbs recently.  Not using CPAP due to lack of f/u and broken equipment.  Denies worsening depressive symptoms, suicidal ideation, or panic; has ongoing anxiety, some improved with celexa but feels "funny" to take 20 mg, and hoping for change to a different med.  Past Medical History  Diagnosis Date  . Dyslipidemia   . HTN (hypertension)   . CAD (coronary artery disease)     a. s/p PCI/BMS pRCA 2002; b. PCI pRCA & DES p/m RCA 2006; c. PCI/DES OM4 2007; d. PCI/CBA to RCA for ISR 10/2006; e.08/2012 STEMI/Cath/PCI: LM nl, LAD95-54m (3.0x20 Promus Prem DES), 95/95 ap LAD (1.0-1.32mm), LCX 30p 95d (sm), LPL1 70-80p, patent stent, LPL2 nl, RCA 30p, 40/70 ISR, EF 35-40%.  . Ischemic cardiomyopathy     a. EF 40%; improved to normal;  b. 08/2012 Echo: EF 50-55%, mod LVH.  [name] GERD (gastroesophageal reflux disease)   . Tobacco abuse   . Obesity   . OSA (obstructive sleep apnea)     Does not use CPAP as of 05/2011  . Depression   . History of pneumonia   . Diabetes mellitus     a. A1c 8.8 08/2012->Metformin initiated. => b. A1c (9/14): 6.6  . Hyperlipidemia    Past Surgical History  Procedure Laterality Date  . Coronary stent  placement  2009    reports that he has been smoking Cigarettes.  He has a 22.5 pack-year smoking history. He has never used smokeless tobacco. He reports that he does not drink alcohol or use illicit drugs. family history includes Cancer in his mother; Coronary artery disease in an other family member; Depression in his mother; Hypertension in his mother; Other in his father. Allergies  Allergen Reactions  . Penicillins Other (See Comments)    Unknown childhood reaction   Current Outpatient Prescriptions on File Prior to Visit  Medication Sig Dispense Refill  . amLODipine (NORVASC) 10 MG tablet Take 1 tablet (10 mg total) by mouth daily.  90 tablet  3  . aspirin 81 MG chewable tablet Chew 81 mg by mouth every morning.      . carvedilol (COREG) 25 MG tablet TAKE 1 TABLET BY MOUTH TWICE DAILY  60 tablet  5  . cloNIDine (CATAPRES) 0.2 MG tablet TAKE 1 TABLET BY MOUTH TWICE DAILY  60 tablet  5  . clopidogrel (PLAVIX) 75 MG tablet Take 1 tablet (75 mg total) by mouth every morning.  90 tablet  3  . Cyanocobalamin (VITAMIN B 12) 100 MCG LOZG Take 200 mcg by mouth daily.      . famotidine (PEPCID) 40 MG tablet TAKE 1 TABLET BY MOUTH EVERY 12 HOURS  60 tablet  11  . furosemide (LASIX) 40 MG tablet Take 1.5 tablets (60 mg total) by mouth 2 (two) times daily.  90 tablet  6  . losartan (COZAAR) 100 MG tablet TAKE 1 TABLET BY MOUTH EVERY DAY  90 tablet  0  . metFORMIN (GLUCOPHAGE) 500 MG tablet TAKE 1 TABLET BY MOUTH EVERY MORNING WITH BREAKFAST  30 tablet  11  . nitroGLYCERIN (NITROSTAT) 0.4 MG SL tablet Place 0.4 mg under the tongue every 5 (five) minutes as needed for chest pain. For chest pain      . potassium chloride SA (K-DUR,KLOR-CON) 20 MEQ tablet Take 1 tablet (20 mEq total) by mouth 2 (two) times daily.  60 tablet  6  . pravastatin (PRAVACHOL) 80 MG tablet Take 1 tablet (80 mg total) by mouth every evening.  90 tablet  3   No current facility-administered medications on file prior to visit.     Review of Systems  Constitutional: Negative for unusual diaphoresis or other sweats  HENT: Negative for ringing in ear Eyes: Negative for double vision or worsening visual disturbance.  Respiratory: Negative for choking and stridor.   Gastrointestinal: Negative for vomiting or other signifcant bowel change Genitourinary: Negative for hematuria or decreased urine volume.  Musculoskeletal: Negative for other MSK pain or swelling Skin: Negative for color change and worsening wound.  Neurological: Negative for tremors and numbness other than noted  Psychiatric/Behavioral: Negative for decreased concentration or agitation other than above       Objective:   Physical Exam BP 122/80  Pulse 56  Temp(Src) 98.6 F (37 C) (Oral)  Ht 6\' 2"  (1.88 m)  Wt 338 lb (153.316 kg)  BMI 43.38 kg/m2  SpO2 96% VS noted,  Constitutional: Pt appears well-developed, well-nourished./morbid obese  HENT: Head: NCAT.  Right Ear: External ear normal.  Left Ear: External ear normal.  Eyes: . Pupils are equal, round, and reactive to light. Conjunctivae and EOM are normal Neck: Normal range of motion. Neck supple.  Cardiovascular: Normal rate and regular rhythm.   Pulmonary/Chest: Effort normal and breath sounds some decreased bilat, no rales or wheezing.  Abd:  Soft, NT, ND, + BS Neurological: Pt is alert. Not confused , motor grossly intact Skin: Skin is warm. No rash. No LE edema today currently Psychiatric: Pt behavior is normal. No agitation.     Assessment & Plan:

## 2013-10-13 NOTE — Assessment & Plan Note (Signed)
Also for refer pulm, needs new equipment

## 2013-10-15 ENCOUNTER — Ambulatory Visit: Payer: Medicare HMO | Admitting: Neurology

## 2013-10-15 ENCOUNTER — Telehealth: Payer: Self-pay | Admitting: Neurology

## 2013-10-15 NOTE — Telephone Encounter (Signed)
This was meant for you

## 2013-10-15 NOTE — Telephone Encounter (Signed)
Pt no showed 10/15/13 appt w/ Dr. Posey Pronto. No show letter mailed to pt / Sherri S.

## 2013-10-15 NOTE — Telephone Encounter (Signed)
Noted.  Donika K. Patel, DO   

## 2013-11-10 ENCOUNTER — Encounter: Payer: Self-pay | Admitting: Pulmonary Disease

## 2013-11-10 ENCOUNTER — Ambulatory Visit (INDEPENDENT_AMBULATORY_CARE_PROVIDER_SITE_OTHER): Payer: Commercial Managed Care - HMO | Admitting: Pulmonary Disease

## 2013-11-10 VITALS — BP 130/72 | HR 57 | Temp 98.6°F | Ht 72.0 in | Wt 346.0 lb

## 2013-11-10 DIAGNOSIS — R0609 Other forms of dyspnea: Secondary | ICD-10-CM

## 2013-11-10 DIAGNOSIS — R0989 Other specified symptoms and signs involving the circulatory and respiratory systems: Secondary | ICD-10-CM

## 2013-11-10 NOTE — Assessment & Plan Note (Signed)
The patient has noticed progressive dyspnea on exertion over the last 7 months, and is starting greatly impacted his quality of life and activities. He does have a long history of smoking, and the question is raised whether he may have obstructive lung disease. He did not see a big change with dulera, and I have asked him to discontinue this until we can reevaluate. He also has a history of an ischemic cardiomyopathy, but his ejection fraction last year had improved considerably. Finally, the patient is morbidly obese, and certainly weight and conditioning could be playing roles as well. I have stressed to the patient the importance of total smoking cessation, and also aggressive weight loss. I will see him back after his pulmonary function studies for reevaluation.

## 2013-11-10 NOTE — Progress Notes (Signed)
   Subjective:    Patient ID: [name] [name], male    DOB: 1956/06/05, 58 y.o.   MRN: [id number]  HPI The patient is a 58 year old male who I've been asked to see for dyspnea. He tells me that he has had progressive dyspnea on exertion over the last 7 months, and it is greatly impacting his quality of life. He gives a history of one to 2 block dyspnea on exertion at a moderate pace on flat ground, and will also get winded walking up a flight of stairs or bringing groceries in from the car. He denies any significant cough and only produces occasional mucus that is white foamy. He unfortunately is continuing to smoke, but has greatly decreased his quantity. He also has a history of an ischemic cardiomyopathy, but his most recent followup echocardiogram showed great improvement. He has had a recent chest x-ray that showed cardiomegaly and prominent lung markings consistent with "dirty lungs". The patient has been started on dulera for the last 2 weeks, and is seen no significant change in his breathing. He tells me that his weight is been up and down over the last year, but he may be 10 pounds heavier overall.   Review of Systems  Constitutional: Negative for fever and unexpected weight change.  HENT: Negative for congestion, dental problem, ear pain, nosebleeds, postnasal drip, rhinorrhea, sinus pressure, sneezing, sore throat and trouble swallowing.   Eyes: Negative for redness and itching.  Respiratory: Positive for cough, chest tightness, shortness of breath and wheezing.   Cardiovascular: Negative for palpitations and leg swelling.  Gastrointestinal: Negative for nausea and vomiting.  Genitourinary: Negative for dysuria.  Musculoskeletal: Negative for joint swelling.  Skin: Negative for rash.  Neurological: Negative for headaches.  Hematological: Does not bruise/bleed easily.  Psychiatric/Behavioral: Negative for dysphoric mood. The patient is not nervous/anxious.        Objective:   Physical Exam Constitutional:  Morbidly obese male, no acute distress  HENT:  Nares patent without discharge, but large turbinates left > right  Oropharynx without exudate, palate and uvula are thickened and elongated.  Eyes:  Perrla, eomi, no scleral icterus  Neck:  No JVD, no TMG  Cardiovascular:  Normal rate, regular rhythm, no rubs or gallops.  No murmurs        Intact distal pulses  Pulmonary :  Normal breath sounds, no stridor or respiratory distress   No rales, rhonchi, or wheezing  Abdominal:  Soft, nondistended, bowel sounds present.  No tenderness noted.   Musculoskeletal:  1+ lower extremity edema noted.  Lymph Nodes:  No cervical lymphadenopathy noted  Skin:  No cyanosis noted  Neurologic:  Alert, appropriate, moves all 4 extremities without obvious deficit.         Assessment & Plan:

## 2013-11-10 NOTE — Patient Instructions (Signed)
Will get you scheduled for breathing studies, and see you back the same day to review. Work on weight reduction.

## 2013-11-13 ENCOUNTER — Emergency Department (HOSPITAL_COMMUNITY): Payer: Medicare HMO

## 2013-11-13 ENCOUNTER — Encounter (HOSPITAL_COMMUNITY): Payer: Self-pay | Admitting: Emergency Medicine

## 2013-11-13 ENCOUNTER — Inpatient Hospital Stay (HOSPITAL_COMMUNITY)
Admission: EM | Admit: 2013-11-13 | Discharge: 2013-11-17 | DRG: 246 | Disposition: A | Discharge status: Home / Self Care | Payer: Medicare HMO | Attending: Cardiology | Admitting: Cardiology

## 2013-11-13 DIAGNOSIS — E785 Hyperlipidemia, unspecified: Secondary | ICD-10-CM | POA: Diagnosis present

## 2013-11-13 DIAGNOSIS — E876 Hypokalemia: Secondary | ICD-10-CM | POA: Diagnosis present

## 2013-11-13 DIAGNOSIS — I214 Non-ST elevation (NSTEMI) myocardial infarction: Principal | ICD-10-CM | POA: Diagnosis present

## 2013-11-13 DIAGNOSIS — I255 Ischemic cardiomyopathy: Secondary | ICD-10-CM

## 2013-11-13 DIAGNOSIS — F17200 Nicotine dependence, unspecified, uncomplicated: Secondary | ICD-10-CM | POA: Diagnosis present

## 2013-11-13 DIAGNOSIS — I5033 Acute on chronic diastolic (congestive) heart failure: Secondary | ICD-10-CM | POA: Diagnosis present

## 2013-11-13 DIAGNOSIS — R0609 Other forms of dyspnea: Secondary | ICD-10-CM

## 2013-11-13 DIAGNOSIS — I2510 Atherosclerotic heart disease of native coronary artery without angina pectoris: Secondary | ICD-10-CM | POA: Diagnosis present

## 2013-11-13 DIAGNOSIS — G4733 Obstructive sleep apnea (adult) (pediatric): Secondary | ICD-10-CM | POA: Diagnosis present

## 2013-11-13 DIAGNOSIS — R079 Chest pain, unspecified: Secondary | ICD-10-CM

## 2013-11-13 DIAGNOSIS — Z6841 Body Mass Index (BMI) 40.0 and over, adult: Secondary | ICD-10-CM

## 2013-11-13 DIAGNOSIS — Z955 Presence of coronary angioplasty implant and graft: Secondary | ICD-10-CM

## 2013-11-13 DIAGNOSIS — I10 Essential (primary) hypertension: Secondary | ICD-10-CM | POA: Diagnosis present

## 2013-11-13 DIAGNOSIS — F3289 Other specified depressive episodes: Secondary | ICD-10-CM | POA: Diagnosis present

## 2013-11-13 DIAGNOSIS — I252 Old myocardial infarction: Secondary | ICD-10-CM

## 2013-11-13 DIAGNOSIS — I509 Heart failure, unspecified: Secondary | ICD-10-CM | POA: Diagnosis present

## 2013-11-13 DIAGNOSIS — Z7982 Long term (current) use of aspirin: Secondary | ICD-10-CM

## 2013-11-13 DIAGNOSIS — F329 Major depressive disorder, single episode, unspecified: Secondary | ICD-10-CM | POA: Diagnosis present

## 2013-11-13 DIAGNOSIS — E119 Type 2 diabetes mellitus without complications: Secondary | ICD-10-CM | POA: Diagnosis present

## 2013-11-13 DIAGNOSIS — Z880 Allergy status to penicillin: Secondary | ICD-10-CM

## 2013-11-13 DIAGNOSIS — R001 Bradycardia, unspecified: Secondary | ICD-10-CM

## 2013-11-13 DIAGNOSIS — I2589 Other forms of chronic ischemic heart disease: Secondary | ICD-10-CM | POA: Diagnosis present

## 2013-11-13 DIAGNOSIS — Z9861 Coronary angioplasty status: Secondary | ICD-10-CM

## 2013-11-13 DIAGNOSIS — I498 Other specified cardiac arrhythmias: Secondary | ICD-10-CM | POA: Diagnosis present

## 2013-11-13 DIAGNOSIS — Z79899 Other long term (current) drug therapy: Secondary | ICD-10-CM

## 2013-11-13 DIAGNOSIS — Z7902 Long term (current) use of antithrombotics/antiplatelets: Secondary | ICD-10-CM

## 2013-11-13 DIAGNOSIS — I5042 Chronic combined systolic (congestive) and diastolic (congestive) heart failure: Secondary | ICD-10-CM | POA: Diagnosis present

## 2013-11-13 DIAGNOSIS — K219 Gastro-esophageal reflux disease without esophagitis: Secondary | ICD-10-CM | POA: Diagnosis present

## 2013-11-13 LAB — CBC
HEMATOCRIT: 42.1 % (ref 39.0–52.0)
HEMOGLOBIN: 14.2 g/dL (ref 13.0–17.0)
MCH: 29 pg (ref 26.0–34.0)
MCHC: 33.7 g/dL (ref 30.0–36.0)
MCV: 86.1 fL (ref 78.0–100.0)
Platelets: 176 10*3/uL (ref 150–400)
RBC: 4.89 MIL/uL (ref 4.22–5.81)
RDW: 15.6 % — ABNORMAL HIGH (ref 11.5–15.5)
WBC: 8.8 10*3/uL (ref 4.0–10.5)

## 2013-11-13 LAB — COMPREHENSIVE METABOLIC PANEL
ALBUMIN: 3.4 g/dL — AB (ref 3.5–5.2)
ALK PHOS: 77 U/L (ref 39–117)
ALT: 21 U/L (ref 0–53)
AST: 19 U/L (ref 0–37)
BUN: 18 mg/dL (ref 6–23)
CALCIUM: 9.1 mg/dL (ref 8.4–10.5)
CO2: 26 mEq/L (ref 19–32)
Chloride: 105 mEq/L (ref 96–112)
Creatinine, Ser: 1.29 mg/dL (ref 0.50–1.35)
GFR calc non Af Amer: 60 mL/min — ABNORMAL LOW (ref >90)
GFR, EST AFRICAN AMERICAN: 70 mL/min — AB (ref >90)
Glucose, Bld: 150 mg/dL — ABNORMAL HIGH (ref 70–99)
POTASSIUM: 3.4 meq/L — AB (ref 3.7–5.3)
Sodium: 143 mEq/L (ref 137–147)
TOTAL PROTEIN: 6.5 g/dL (ref 6.0–8.3)
Total Bilirubin: <0.2 mg/dL — ABNORMAL LOW (ref 0.3–1.2)

## 2013-11-13 LAB — PROTIME-INR
INR: 0.97 (ref 0.00–1.49)
PROTHROMBIN TIME: 12.7 s (ref 11.6–15.2)

## 2013-11-13 LAB — PRO B NATRIURETIC PEPTIDE: PRO B NATRI PEPTIDE: 211.6 pg/mL — AB (ref 0–125)

## 2013-11-13 LAB — URINE MICROSCOPIC-ADD ON

## 2013-11-13 LAB — URINALYSIS, ROUTINE W REFLEX MICROSCOPIC
Bilirubin Urine: NEGATIVE
Glucose, UA: 100 mg/dL — AB
Ketones, ur: NEGATIVE mg/dL
LEUKOCYTES UA: NEGATIVE
NITRITE: NEGATIVE
PROTEIN: 100 mg/dL — AB
Specific Gravity, Urine: 1.019 (ref 1.005–1.030)
UROBILINOGEN UA: 1 mg/dL (ref 0.0–1.0)
pH: 5 (ref 5.0–8.0)

## 2013-11-13 NOTE — ED Notes (Signed)
Patient presents to ED via [facility]. Patient states that approx 1 hour ago he started having mid "chest pressure." Episode lasted approx 30 minutes and patient had full relief after taking 1 nitro at home. Patient also c/ of "shortness of breath" with chest pressure. States that he has had a productive cough x4 days. Lungs clear. 324 ASA give pta. A&Ox4. No acute distress noted at this time- denying chest pressure at this time. Place on cardiac monitor.

## 2013-11-13 NOTE — ED Provider Notes (Signed)
CSN: [id number]     Arrival date & time 11/13/13  2030 History   First MD Initiated Contact with Patient 11/13/13 2302     Chief Complaint  Patient presents with  . Chest Pain  . Shortness of Breath     (Consider location/radiation/quality/duration/timing/severity/associated sxs/prior Treatment) HPI 58 year old male presents to emergency department from home via EMS with complaint of chest pain.  Patient reports pain started around 6 PM today.  When pain worsened over course of an hour, he took a nitroglycerin.  He reports 2-3 minutes after taking one nitroglycerin the pain resolved.  Pain is described as dull, heavy.  Symptoms were slightly similar to his heart attack 1 year ago in March.  Patient also complains of shortness of breath has been going on for several weeks.  The shortness of breath is described as dyspnea on exertion and orthopnea.  He does report some swelling in his legs.  His primary care Dr. has seen him recently and recommended compression stockings which he has not yet started.  He reports that he has testing for his pulmonary function upcoming.  He was recently seen by his pulmonologist who suggested a multifactorial cause for his symptoms to include obesity, smoking, ischemic heart disease and COPD.  Patient reports he is compliant with all his medications.  He reports that he has been more stressed over last few days over bills and has increased his cigarette smoking from sort 4 cigarettes a day to nearly a pack today.  He has had increased cough over the last few days.  He denies fever or chills.  Past Medical History  Diagnosis Date  . Dyslipidemia   . HTN (hypertension)   . CAD (coronary artery disease)     a. s/p PCI/BMS pRCA 2002; b. PCI pRCA & DES p/m RCA 2006; c. PCI/DES OM4 2007; d. PCI/CBA to RCA for ISR 10/2006; e.08/2012 STEMI/Cath/PCI: LM nl, LAD95-71m (3.0x20 Promus Prem DES), 95/95 ap LAD (1.0-1.31mm), LCX 30p 95d (sm), LPL1 70-80p, patent stent, LPL2 nl, RCA 30p,  40/70 ISR, EF 35-40%.  . Ischemic cardiomyopathy     a. EF 40%; improved to normal;  b. 08/2012 Echo: EF 50-55%, mod LVH.  [name] GERD (gastroesophageal reflux disease)   . Tobacco abuse   . Obesity   . OSA (obstructive sleep apnea)     Does not use CPAP as of 05/2011  . Depression   . History of pneumonia   . Diabetes mellitus     a. A1c 8.8 08/2012->Metformin initiated. => b. A1c (9/14): 6.6  . Hyperlipidemia    Past Surgical History  Procedure Laterality Date  . Coronary stent placement  2009  . Coronary angioplasty with stent placement     Family History  Problem Relation Age of Onset  . Coronary artery disease    . Depression Mother   . Cancer Mother     ovarian  . Hypertension Mother     Died, 57  . Other Father     motor vehicle accident   History  Substance Use Topics  . Smoking status: Current Every Day Smoker -- 0.75 packs/day for 30 years    Types: Cigarettes  . Smokeless tobacco: Never Used     Comment: trying to cut down  . Alcohol Use: No    Review of Systems  See History of Present Illness; otherwise all other systems are reviewed and negative   Allergies  Penicillins  Home Medications   Prior to Admission medications  Medication Sig Start Date End Date Taking? Authorizing Provider  amLODipine (NORVASC) 10 MG tablet Take 1 tablet (10 mg total) by mouth daily. 06/26/13  Yes Biagio Borg, MD  aspirin 81 MG tablet Take 81 mg by mouth daily.   Yes Historical Provider, MD  carvedilol (COREG) 25 MG tablet Take 25 mg by mouth 2 (two) times daily with a meal.   Yes Historical Provider, MD  cloNIDine (CATAPRES) 0.2 MG tablet Take 0.2 mg by mouth 2 (two) times daily.   Yes Historical Provider, MD  clopidogrel (PLAVIX) 75 MG tablet Take 1 tablet (75 mg total) by mouth every morning. 06/26/13  Yes Biagio Borg, MD  Cyanocobalamin (VITAMIN B 12) 100 MCG LOZG Take 200 mcg by mouth daily.   Yes Historical Provider, MD  escitalopram (LEXAPRO) 10 MG tablet Take 1 tablet  (10 mg total) by mouth daily. 10/13/13 01/11/14 Yes Biagio Borg, MD  famotidine (PEPCID) 40 MG tablet Take 40 mg by mouth 2 (two) times daily.   Yes Historical Provider, MD  furosemide (LASIX) 40 MG tablet Take 60 mg by mouth 2 (two) times daily.   Yes Historical Provider, MD  losartan (COZAAR) 100 MG tablet Take 100 mg by mouth daily.   Yes Historical Provider, MD  metFORMIN (GLUCOPHAGE) 500 MG tablet Take 500 mg by mouth daily with breakfast.   Yes Historical Provider, MD  mometasone-formoterol (DULERA) 100-5 MCG/ACT AERO Inhale 2 puffs into the lungs 2 (two) times daily. 10/13/13  Yes Biagio Borg, MD  nitroGLYCERIN (NITROSTAT) 0.4 MG SL tablet Place 0.4 mg under the tongue every 5 (five) minutes as needed for chest pain. For chest pain 09/02/12  Yes Rogelia Mire, NP  potassium chloride SA (K-DUR,KLOR-CON) 20 MEQ tablet Take 1 tablet (20 mEq total) by mouth 2 (two) times daily. 02/26/13  Yes Scott Joylene Draft, PA-C  pravastatin (PRAVACHOL) 80 MG tablet Take 1 tablet (80 mg total) by mouth every evening. 06/26/13  Yes Biagio Borg, MD   BP 128/59  Pulse 56  Temp(Src) 98.1 F (36.7 C) (Oral)  Resp 22  Ht 6\' 1"  (1.854 m)  Wt 340 lb (154.223 kg)  BMI 44.87 kg/m2  SpO2 96% Physical Exam  Nursing note and vitals reviewed. Constitutional: He is oriented to person, place, and time. He appears well-developed and well-nourished.  Obese male no acute distress  HENT:  Head: Normocephalic and atraumatic.  Nose: Nose normal.  Mouth/Throat: Oropharynx is clear and moist.  Eyes: Conjunctivae and EOM are normal. Pupils are equal, round, and reactive to light.  Neck: Normal range of motion. Neck supple. No JVD present. No tracheal deviation present. No thyromegaly present.  Cardiovascular: Normal rate, regular rhythm, normal heart sounds and intact distal pulses.  Exam reveals no gallop and no friction rub.   No murmur heard. Pulmonary/Chest: Effort normal and breath sounds normal. No stridor. No  respiratory distress. He has no wheezes. He has no rales. He exhibits no tenderness.  Abdominal: Soft. Bowel sounds are normal. He exhibits no distension and no mass. There is no tenderness. There is no rebound and no guarding.  Musculoskeletal: Normal range of motion. He exhibits edema (nonpitting edema to lower extremities). He exhibits no tenderness.  Lymphadenopathy:    He has no cervical adenopathy.  Neurological: He is alert and oriented to person, place, and time. He exhibits normal muscle tone. Coordination normal.  Skin: Skin is warm and dry. No rash noted. No erythema. No pallor.  Psychiatric: He  has a normal mood and affect. His behavior is normal. Judgment and thought content normal.    ED Course  Procedures (including critical care time) Labs Review Labs Reviewed  PRO B NATRIURETIC PEPTIDE - Abnormal; Notable for the following:    Pro B Natriuretic peptide (BNP) 211.6 (*)    All other components within normal limits  URINALYSIS, ROUTINE W REFLEX MICROSCOPIC - Abnormal; Notable for the following:    APPearance CLOUDY (*)    Glucose, UA 100 (*)    Hgb urine dipstick TRACE (*)    Protein, ur 100 (*)    All other components within normal limits  COMPREHENSIVE METABOLIC PANEL - Abnormal; Notable for the following:    Potassium 3.4 (*)    Glucose, Bld 150 (*)    Albumin 3.4 (*)    Total Bilirubin <0.2 (*)    GFR calc non Af Amer 60 (*)    GFR calc Af Amer 70 (*)    All other components within normal limits  CBC - Abnormal; Notable for the following:    RDW 15.6 (*)    All other components within normal limits  PROTIME-INR  URINE MICROSCOPIC-ADD ON  Randolm Idol, ED    Imaging Review Dg Chest 2 View  11/13/2013   CLINICAL DATA:  Shortness of breath and chest pain  EXAM: CHEST  2 VIEW  COMPARISON:  September 28, 2013  FINDINGS: There is mild scarring in the right base. The interstitium in the lower lobes is mildly prominent but stable. There is no frank edema or  consolidation. Heart size is upper normal with normal pulmonary vascularity. No adenopathy. No bone lesions.  IMPRESSION: Scarring in the right base. Probable chronic inflammatory change in both lower lobes, stable. No edema or consolidation. The cardiac silhouette is stable.   Electronically Signed   By: Lowella Grip M.D.   On: 11/13/2013 21:15     EKG Interpretation   Date/Time:  Friday November 13 2013 20:34:35 EDT Ventricular Rate:  64 PR Interval:  167 QRS Duration: 160 QT Interval:  524 QTC Calculation: 541 R Axis:   -20 Text Interpretation:  Sinus rhythm Probable left atrial enlargement Right  bundle branch block Non-specific abnormality, ST segment, and/or T-wave  Confirmed by Palin Tristan  MD, Cathrine Krizan ([id number]) on 11/13/2013 11:06:13 PM      MDM   Final diagnoses:  Chest pain    58 year old male with chest pain history is significant heart artery disease with multiple stents and MI a year and a half ago.  Workup here fairly unremarkable.  We'll plan for second troponin and will touch base with cardiology.  7:15 AM Delayed entry.  Pt re-assessed, reports brief episode of chest pressure after being moved from trauma room to new bed.  D/w cardiology who will admit for obs.    Kalman Drape, MD 11/14/13 [phone]

## 2013-11-13 NOTE — ED Notes (Signed)
Blood was collected and labeled by this RN. Phlebotomist Kim took blood to send to lab.

## 2013-11-14 ENCOUNTER — Encounter (HOSPITAL_COMMUNITY): Payer: Self-pay | Admitting: Internal Medicine

## 2013-11-14 DIAGNOSIS — I5033 Acute on chronic diastolic (congestive) heart failure: Secondary | ICD-10-CM

## 2013-11-14 DIAGNOSIS — I200 Unstable angina: Secondary | ICD-10-CM

## 2013-11-14 DIAGNOSIS — I5042 Chronic combined systolic (congestive) and diastolic (congestive) heart failure: Secondary | ICD-10-CM | POA: Diagnosis present

## 2013-11-14 DIAGNOSIS — I509 Heart failure, unspecified: Secondary | ICD-10-CM

## 2013-11-14 DIAGNOSIS — R079 Chest pain, unspecified: Secondary | ICD-10-CM

## 2013-11-14 DIAGNOSIS — I214 Non-ST elevation (NSTEMI) myocardial infarction: Secondary | ICD-10-CM | POA: Diagnosis present

## 2013-11-14 DIAGNOSIS — Z9861 Coronary angioplasty status: Secondary | ICD-10-CM

## 2013-11-14 DIAGNOSIS — I2510 Atherosclerotic heart disease of native coronary artery without angina pectoris: Secondary | ICD-10-CM

## 2013-11-14 DIAGNOSIS — E785 Hyperlipidemia, unspecified: Secondary | ICD-10-CM | POA: Diagnosis present

## 2013-11-14 LAB — BASIC METABOLIC PANEL
BUN: 16 mg/dL (ref 6–23)
CALCIUM: 9.3 mg/dL (ref 8.4–10.5)
CO2: 26 mEq/L (ref 19–32)
Chloride: 104 mEq/L (ref 96–112)
Creatinine, Ser: 1.19 mg/dL (ref 0.50–1.35)
GFR calc non Af Amer: 67 mL/min — ABNORMAL LOW (ref >90)
GFR, EST AFRICAN AMERICAN: 77 mL/min — AB (ref >90)
GLUCOSE: 129 mg/dL — AB (ref 70–99)
Potassium: 4.2 mEq/L (ref 3.7–5.3)
SODIUM: 143 meq/L (ref 137–147)

## 2013-11-14 LAB — TROPONIN I
Troponin I: 0.36 ng/mL (ref <0.30)
Troponin I: 0.54 ng/mL (ref <0.30)
Troponin I: <0.3 ng/mL (ref <0.30)

## 2013-11-14 LAB — GLUCOSE, CAPILLARY
GLUCOSE-CAPILLARY: 193 mg/dL — AB (ref 70–99)
GLUCOSE-CAPILLARY: 125 mg/dL — AB (ref 70–99)
GLUCOSE-CAPILLARY: 93 mg/dL (ref 70–99)
Glucose-Capillary: 134 mg/dL — ABNORMAL HIGH (ref 70–99)

## 2013-11-14 LAB — HEPARIN LEVEL (UNFRACTIONATED): Heparin Unfractionated: 0.14 IU/mL — ABNORMAL LOW (ref 0.30–0.70)

## 2013-11-14 MED ORDER — LOSARTAN POTASSIUM 50 MG PO TABS
100.0000 mg | ORAL_TABLET | Freq: Every day | ORAL | Status: DC
  Administered 2013-11-14 – 2013-11-17 (×4): 100 mg via ORAL
  Filled 2013-11-14 (×4): qty 2

## 2013-11-14 MED ORDER — FUROSEMIDE 40 MG PO TABS: 60.0000 mg | ORAL_TABLET | Freq: Two times a day (BID) | ORAL | Status: DC

## 2013-11-14 MED ORDER — MOMETASONE FURO-FORMOTEROL FUM 100-5 MCG/ACT IN AERO
2.0000 | INHALATION_SPRAY | Freq: Two times a day (BID) | RESPIRATORY_TRACT | Status: DC
  Administered 2013-11-14 – 2013-11-17 (×7): 2 via RESPIRATORY_TRACT
  Filled 2013-11-14: qty 8.8

## 2013-11-14 MED ORDER — INSULIN ASPART 100 UNIT/ML ~~LOC~~ SOLN
0.0000 [IU] | Freq: Three times a day (TID) | SUBCUTANEOUS | Status: DC
  Administered 2013-11-14: 3 [IU] via SUBCUTANEOUS
  Administered 2013-11-14: 17:00:00 via SUBCUTANEOUS
  Administered 2013-11-15 – 2013-11-17 (×3): 2 [IU] via SUBCUTANEOUS

## 2013-11-14 MED ORDER — ACETAMINOPHEN 325 MG PO TABS
650.0000 mg | ORAL_TABLET | ORAL | Status: DC | PRN
  Administered 2013-11-14 – 2013-11-15 (×2): 650 mg via ORAL
  Filled 2013-11-14 (×2): qty 2

## 2013-11-14 MED ORDER — NITROGLYCERIN IN D5W 200-5 MCG/ML-% IV SOLN
10.0000 ug/min | INTRAVENOUS | Status: DC
  Administered 2013-11-14: 5 ug/min via INTRAVENOUS
  Filled 2013-11-14: qty 250

## 2013-11-14 MED ORDER — CARVEDILOL 25 MG PO TABS
25.0000 mg | ORAL_TABLET | Freq: Two times a day (BID) | ORAL | Status: DC
  Administered 2013-11-14: 25 mg via ORAL
  Filled 2013-11-14 (×3): qty 1

## 2013-11-14 MED ORDER — AMLODIPINE BESYLATE 10 MG PO TABS
10.0000 mg | ORAL_TABLET | Freq: Every day | ORAL | Status: DC
  Administered 2013-11-14 – 2013-11-17 (×4): 10 mg via ORAL
  Filled 2013-11-14 (×4): qty 1

## 2013-11-14 MED ORDER — FUROSEMIDE 10 MG/ML IJ SOLN
40.0000 mg | Freq: Two times a day (BID) | INTRAMUSCULAR | Status: AC
  Administered 2013-11-14: 40 mg via INTRAVENOUS

## 2013-11-14 MED ORDER — CLOPIDOGREL BISULFATE 75 MG PO TABS
75.0000 mg | ORAL_TABLET | Freq: Every morning | ORAL | Status: DC
  Filled 2013-11-14: qty 1

## 2013-11-14 MED ORDER — HEPARIN BOLUS VIA INFUSION
3000.0000 [IU] | Freq: Once | INTRAVENOUS | Status: AC
  Administered 2013-11-14: 3000 [IU] via INTRAVENOUS
  Filled 2013-11-14: qty 3000

## 2013-11-14 MED ORDER — INSULIN ASPART 100 UNIT/ML ~~LOC~~ SOLN: 0.0000 [IU] | Freq: Every day | SUBCUTANEOUS | Status: DC

## 2013-11-14 MED ORDER — ISOSORBIDE MONONITRATE ER 30 MG PO TB24
30.0000 mg | ORAL_TABLET | Freq: Every day | ORAL | Status: DC
  Administered 2013-11-14: 30 mg via ORAL
  Filled 2013-11-14: qty 1

## 2013-11-14 MED ORDER — FUROSEMIDE 40 MG PO TABS
60.0000 mg | ORAL_TABLET | Freq: Two times a day (BID) | ORAL | Status: DC
  Administered 2013-11-15 – 2013-11-17 (×5): 60 mg via ORAL
  Filled 2013-11-14 (×7): qty 1

## 2013-11-14 MED ORDER — ATORVASTATIN CALCIUM 40 MG PO TABS
40.0000 mg | ORAL_TABLET | Freq: Every day | ORAL | Status: DC
  Administered 2013-11-14: 40 mg via ORAL
  Filled 2013-11-14 (×2): qty 1

## 2013-11-14 MED ORDER — CLONIDINE HCL 0.2 MG PO TABS
0.2000 mg | ORAL_TABLET | Freq: Two times a day (BID) | ORAL | Status: DC
  Administered 2013-11-14 – 2013-11-17 (×7): 0.2 mg via ORAL
  Filled 2013-11-14 (×8): qty 1

## 2013-11-14 MED ORDER — POTASSIUM CHLORIDE CRYS ER 20 MEQ PO TBCR
40.0000 meq | EXTENDED_RELEASE_TABLET | ORAL | Status: AC
  Administered 2013-11-14 (×2): 40 meq via ORAL
  Filled 2013-11-14 (×2): qty 2

## 2013-11-14 MED ORDER — HEPARIN BOLUS VIA INFUSION
4000.0000 [IU] | Freq: Once | INTRAVENOUS | Status: AC
  Administered 2013-11-14: 4000 [IU] via INTRAVENOUS
  Filled 2013-11-14: qty 4000

## 2013-11-14 MED ORDER — ENOXAPARIN SODIUM 40 MG/0.4ML ~~LOC~~ SOLN
40.0000 mg | SUBCUTANEOUS | Status: DC
  Filled 2013-11-14: qty 0.4

## 2013-11-14 MED ORDER — NITROGLYCERIN 0.4 MG SL SUBL
0.4000 mg | SUBLINGUAL_TABLET | SUBLINGUAL | Status: DC | PRN
  Administered 2013-11-14: 0.4 mg via SUBLINGUAL
  Filled 2013-11-14: qty 1

## 2013-11-14 MED ORDER — POTASSIUM CHLORIDE CRYS ER 20 MEQ PO TBCR
20.0000 meq | EXTENDED_RELEASE_TABLET | Freq: Every day | ORAL | Status: DC
  Administered 2013-11-15 – 2013-11-17 (×3): 20 meq via ORAL
  Filled 2013-11-14 (×4): qty 1

## 2013-11-14 MED ORDER — MORPHINE SULFATE 2 MG/ML IJ SOLN: 2.0000 mg | INTRAMUSCULAR | Status: DC | PRN

## 2013-11-14 MED ORDER — FUROSEMIDE 10 MG/ML IJ SOLN
40.0000 mg | Freq: Two times a day (BID) | INTRAMUSCULAR | Status: DC
  Administered 2013-11-14: 40 mg via INTRAVENOUS
  Filled 2013-11-14 (×2): qty 4

## 2013-11-14 MED ORDER — ASPIRIN 81 MG PO CHEW
81.0000 mg | CHEWABLE_TABLET | Freq: Every day | ORAL | Status: DC
  Administered 2013-11-14 – 2013-11-17 (×3): 81 mg via ORAL
  Filled 2013-11-14 (×4): qty 1

## 2013-11-14 MED ORDER — ESCITALOPRAM OXALATE 10 MG PO TABS
10.0000 mg | ORAL_TABLET | Freq: Every day | ORAL | Status: DC
  Administered 2013-11-14 – 2013-11-16 (×3): 10 mg via ORAL
  Filled 2013-11-14 (×4): qty 1

## 2013-11-14 MED ORDER — HEPARIN (PORCINE) IN NACL 100-0.45 UNIT/ML-% IJ SOLN
1900.0000 [IU]/h | INTRAMUSCULAR | Status: DC
  Administered 2013-11-14 (×2): 1900 [IU]/h via INTRAVENOUS
  Administered 2013-11-14: 1600 [IU]/h via INTRAVENOUS
  Administered 2013-11-15 – 2013-11-16 (×2): 1900 [IU]/h via INTRAVENOUS
  Filled 2013-11-14 (×6): qty 250

## 2013-11-14 MED ORDER — FAMOTIDINE 40 MG PO TABS
40.0000 mg | ORAL_TABLET | Freq: Two times a day (BID) | ORAL | Status: DC
  Administered 2013-11-14 – 2013-11-17 (×7): 40 mg via ORAL
  Filled 2013-11-14 (×8): qty 1

## 2013-11-14 MED ORDER — CLOPIDOGREL BISULFATE 75 MG PO TABS
75.0000 mg | ORAL_TABLET | Freq: Every day | ORAL | Status: DC
  Administered 2013-11-14 – 2013-11-17 (×4): 75 mg via ORAL
  Filled 2013-11-14 (×3): qty 1

## 2013-11-14 MED ORDER — CARVEDILOL 12.5 MG PO TABS
12.5000 mg | ORAL_TABLET | Freq: Two times a day (BID) | ORAL | Status: DC
  Administered 2013-11-14 – 2013-11-17 (×6): 12.5 mg via ORAL
  Filled 2013-11-14 (×9): qty 1

## 2013-11-14 NOTE — Progress Notes (Signed)
Pt c/o chest pressure 3/10 Sharrell Ku notified. EKG obtained, VSS, given nirto PRN.

## 2013-11-14 NOTE — H&P (Addendum)
History and Physical  Patient ID: [name] [name] MRN: [id number], DOB: 08-01-1955 Date of Encounter: 11/14/2013, 2:39 AM Primary Physician: Cathlean Cower, MD Primary Cardiologist: Sherren Mocha, MD  Chief Complaint: Chest pain Reason for Admission: Unstable angina  HPI: 58 year-old man with history of coronary artery disease in multiple PCI's, ischemic cardiomyopathy (LVEF 50-55% by echo in 08/2012), hypertension, hyperlipidemia, type 2 diabetes mellitus, and obstructive sleep apnea (intermittently compliant with CPAP), who presented to the emergency department with chest pain earlier today.  The patient states that he was seated this afternoon, when he suddenly developed central chest tightness with a maximal intensity at 8/10.  After about 10-15 minutes, he took a single sublingual nitroglycerin with subsequent resolution of the pain within about 5 minutes.  He reports associated diaphoresis, discomfort in his jaw, and worsening of his baseline shortness of breath.  Since arriving in the emergency department, he had one further episode of very brief chest tightness that was less severe and resolved spontaneously after a few minutes.  He notes that the discomfort that occurred earlier in the day similar to what he felt prior to his STEMI last year, albeit not as intense.  His POC troponin was reportedly negative (not resulted in Epic).  Second Tn also negative.  He is currently chest pain free.  [name] [name] also notes worsening exertional dyspnea over the course of the last few months.  This has been most pronounced over the last week.  He reports associated leg and abdominal swelling, though he is unable to quantify how much weight he has gained.  He also reports a productive cough of clear sputum over the last week.  He now finds that even walking from his car into his home causes him to be very short of breath, much more noticeable than what he experienced about 3 months ago.  He was recently evaluated by  Dr. Gwenette Greet in the pulmonary clinic, at which time it was recommended that [name] be discontinued as it has not improved his symptoms.  He has stable 3-4 pillow orthopnea and no significant PND.  He wears his CPAP approximately 1 or 2 nights a week.  The patient reports being compliant with his home medications, including aspirin, clopidogrel, carvedilol, losartan, and furosemide.   Past Medical History  Diagnosis Date  . Dyslipidemia   . HTN (hypertension)   . CAD (coronary artery disease)     a. s/p PCI/BMS pRCA 2002; b. PCI pRCA & DES p/m RCA 2006; c. PCI/DES OM4 2007; d. PCI/CBA to RCA for ISR 10/2006; e.08/2012 STEMI/Cath/PCI: LM nl, LAD95-58m (3.0x20 Promus Prem DES), 95/95 ap LAD (1.0-1.73mm), LCX 30p 95d (sm), LPL1 70-80p, patent stent, LPL2 nl, RCA 30p, 40/70 ISR, EF 35-40%.  . Ischemic cardiomyopathy     a. EF 40%; improved to normal;  b. 08/2012 Echo: EF 50-55%, mod LVH.  [name] GERD (gastroesophageal reflux disease)   . Tobacco abuse   . Obesity   . OSA (obstructive sleep apnea)     Does not use CPAP as of 05/2011  . Depression   . History of pneumonia   . Diabetes mellitus     a. A1c 8.8 08/2012->Metformin initiated. => b. A1c (9/14): 6.6  . Hyperlipidemia      Most Recent Cardiac Studies: Echocardiogram (09/01/12): LVH with inferobasal hypokinesis and low normal systolic function (LVEF 07-37%).  LHC/PCI (08/31/12): Severe multivessel coronary disease with 9095% mid LAD and 95% distal LAD stenosis .  Large LCx with 30% proximal and 95%  distal LCx stenoses.  First posterolateral branch has a 7080% stenosis immediately proximal to the previously placed stent.  RCA is a large, codominant vessel with 30% proximal and 40-70% mid RCA in-stent restenosis.  Successful PCI to mid LAD with a 3.0 x 20 mm Promus drug-eluting stent.  Myoview (02/23/11): Moderate scar in the basal and mid inferior and inferolateral segments with possible slight peri-infarct ischemia.  LVEF 41%.   Surgical History:    Past Surgical History  Procedure Laterality Date  . Coronary stent placement  2009  . Coronary angioplasty with stent placement       Home Meds: Prior to Admission medications   Medication Sig Start Date [name] Taking? Authorizing Provider  amLODipine (NORVASC) 10 MG tablet Take 1 tablet (10 mg total) by mouth daily. 06/26/13  Yes Biagio Borg, MD  aspirin 81 MG tablet Take 81 mg by mouth daily.   Yes Historical Provider, MD  carvedilol (COREG) 25 MG tablet Take 25 mg by mouth 2 (two) times daily with a meal.   Yes Historical Provider, MD  cloNIDine (CATAPRES) 0.2 MG tablet Take 0.2 mg by mouth 2 (two) times daily.   Yes Historical Provider, MD  clopidogrel (PLAVIX) 75 MG tablet Take 1 tablet (75 mg total) by mouth every morning. 06/26/13  Yes Biagio Borg, MD  Cyanocobalamin (VITAMIN B 12) 100 MCG LOZG Take 200 mcg by mouth daily.   Yes Historical Provider, MD  escitalopram (LEXAPRO) 10 MG tablet Take 1 tablet (10 mg total) by mouth daily. 10/13/13 01/11/14 Yes Biagio Borg, MD  famotidine (PEPCID) 40 MG tablet Take 40 mg by mouth 2 (two) times daily.   Yes Historical Provider, MD  furosemide (LASIX) 40 MG tablet Take 60 mg by mouth 2 (two) times daily.   Yes Historical Provider, MD  losartan (COZAAR) 100 MG tablet Take 100 mg by mouth daily.   Yes Historical Provider, MD  metFORMIN (GLUCOPHAGE) 500 MG tablet Take 500 mg by mouth daily with breakfast.   Yes Historical Provider, MD  mometasone-formoterol (DULERA) 100-5 MCG/ACT AERO Inhale 2 puffs into the lungs 2 (two) times daily. 10/13/13  Yes Biagio Borg, MD  nitroGLYCERIN (NITROSTAT) 0.4 MG SL tablet Place 0.4 mg under the tongue every 5 (five) minutes as needed for chest pain. For chest pain 09/02/12  Yes Rogelia Mire, NP  potassium chloride SA (K-DUR,KLOR-CON) 20 MEQ tablet Take 1 tablet (20 mEq total) by mouth 2 (two) times daily. 02/26/13  Yes Scott Joylene Draft, PA-C  pravastatin (PRAVACHOL) 80 MG tablet Take 1 tablet (80 mg total) by  mouth every evening. 06/26/13  Yes Biagio Borg, MD    Allergies:  Allergies  Allergen Reactions  . Penicillins Other (See Comments)    Unknown childhood reaction    History   Social History  . Marital Status: Single    Spouse Name: N/A    Number of Children: 0  . Years of Education: N/A   Occupational History  . retired    Social History Main Topics  . Smoking status: Current Every Day Smoker -- 0.75 packs/day for 30 years    Types: Cigarettes  . Smokeless tobacco: Never Used     Comment: trying to cut down  . Alcohol Use: No  . Drug Use: No  . Sexual Activity: Not on file   Other Topics Concern  . Not on file   Social History Narrative   Lives alone.  He has no children.  He retired early due to health problems.           Family History  Problem Relation Age of Onset  . Coronary artery disease    . Depression Mother   . Cancer Mother     ovarian  . Hypertension Mother     Died, 53  . Other Father     motor vehicle accident    Review of Systems: A 12 system review of systems was performed and was notable for intermittent chills as well as an episode of black and tarry stools about one month ago.  The patient otherwise denies melena, hematochezia, and hematemesis.  He notes intermittent tingling in his left hand when waking up at night.  Labs:   Lab Results  Component Value Date   WBC 8.8 11/13/2013   HGB 14.2 11/13/2013   HCT 42.1 11/13/2013   MCV 86.1 11/13/2013   PLT 176 11/13/2013    Recent Labs Lab 11/13/13 2043  NA 143  K 3.4*  CL 105  CO2 26  BUN 18  CREATININE 1.29  CALCIUM 9.1  PROT 6.5  BILITOT <0.2*  ALKPHOS 77  ALT 21  AST 19  GLUCOSE 150*    Recent Labs  11/13/13 2342  TROPONINI <0.30   Lab Results  Component Value Date   CHOL 130 06/29/2013   HDL 41.00 06/29/2013   LDLCALC 76 06/29/2013   TRIG 65.0 06/29/2013   Lab Results  Component Value Date   DDIMER 0.48 01/07/2011    Radiology/Studies:  Dg Chest 2  View  11/13/2013   CLINICAL DATA:  Shortness of breath and chest pain  EXAM: CHEST  2 VIEW  COMPARISON:  September 28, 2013  FINDINGS: There is mild scarring in the right base. The interstitium in the lower lobes is mildly prominent but stable. There is no frank edema or consolidation. Heart size is upper normal with normal pulmonary vascularity. No adenopathy. No bone lesions.  IMPRESSION: Scarring in the right base. Probable chronic inflammatory change in both lower lobes, stable. No edema or consolidation. The cardiac silhouette is stable.   Electronically Signed   By: Lowella Grip M.D.   On: 11/13/2013 21:15     EKG: Normal sinus rhythm with right bundle branch block.  Nonspecific T-wave changes present, limited by baseline artifact.  Tracing is similar to the prior EKG from 09/28/13.  Physical Exam: Blood pressure 123/68, pulse 54, temperature 98.1 F (36.7 C), temperature source Oral, resp. rate 20, height 6\' 1"  (1.854 m), weight 154.223 kg (340 lb), SpO2 99.00%. General: Obese man, lying comfortably in bed. Head: Normocephalic, atraumatic, sclera non-icteric, no xanthomas, nares are without discharge.  PERRL, EOMI. Neck: Negative for carotid bruits. JVP ~10 cm with positive HJR. Lungs: Clear bilaterally to auscultation without wheezes, rales, or rhonchi. Breathing is unlabored. Heart: Bradycardic but regular without murmurs, rubs, or gallops. Abdomen: Soft, non-tender, non-distended with normoactive bowel sounds. No hepatomegaly. No rebound/guarding. No obvious abdominal masses. Msk:  Strength and tone appear normal for age. Extremities: No clubbing or cyanosis.1+ pretibial edema bilaterally.  Distal pedal pulses are 2+ and equal bilaterally. Neuro: Alert and oriented X 3. No focal deficit. No facial asymmetry. Moves all extremities spontaneously. Psych:  Responds to questions appropriately with a normal affect.    ASSESSMENT AND PLAN:  58 year old man with history of CAD, ICM, DM, HTN,  HLD, and OSA, presenting with an episode of anginal chest pain at rest relieved by NTG x 1, with brief  recurrence in ED.  He also endorses worsening heart failure symptoms over the last week.  Unstable angina: Patient with severe CAD, most recent LHC in 08/2012 at which time mid LAD was stented.  He has residual disease involving the LCx and RCA.  No evidence of active infarction; pt now chest pain free.  - Admit to tele under observation status  - Trend troponins and repeat EKG in AM  - Consider stress test vs repeat LHC  - Continue ASA, clopidogrel, metoprolol  - Switch pravastatin to atorvastatin per [place]  - Start Imdur 30 mg daily  CHF: ICM with most recent LVEF 50-55%.  Pt with progressive dyspnea and leg swelling.  - Switch Lasix to 40 mg IV BID, titrate for goal net negative 1-2 L in 24 hours  - Continue carvedilol and losartan  DM:  - Hold metformin while inpatient  - Sliding scale insulin  HTN: Patient normotensive at this time  - Continue current regimen  FEN/GI:  - Replete K for goal > 4  - NPO in case procedure needed (stress vs LHC)  - Continue famotidine  Prophylaxis:  - Lovenox for DVT prophylaxis  Code status:  - Full code  ADDENDUM (11/14/13 @ 6378): Patient's 6:30 AM troponin was found to be elevated at 0.36.  While this is not markedly elevated, in light of his history of extensive coronary disease and typical symptoms at rest, we will initiate heparin infusion per pharmacy for treatment of ACS.  He will continue on aspirin and clopidogrel.  Signed, Glena Norfolk Surie Suchocki MD 11/14/2013, 2:39 AM

## 2013-11-14 NOTE — Progress Notes (Signed)
ANTICOAGULATION CONSULT NOTE - Initial Consult  Pharmacy Consult for heparin Indication: [place]  Allergies  Allergen Reactions  . Penicillins Other (See Comments)    Unknown childhood reaction    Patient Measurements: Height: 6\' 1"  (185.4 cm) Weight: 338 lb 6.4 oz (153.497 kg) IBW/kg (Calculated) : 79.9 Heparin Dosing Weight: 116 kg  Vital Signs: Temp: 97.4 F (36.3 C) (06/06 0504) Temp src: Oral (06/06 0504) BP: 138/81 mmHg (06/06 0504) Pulse Rate: 58 (06/06 0504)  Labs:  Recent Labs  11/13/13 2043 11/13/13 2342 11/14/13 0635  HGB 14.2  --   --   HCT 42.1  --   --   PLT 176  --   --   LABPROT 12.7  --   --   INR 0.97  --   --   CREATININE 1.29  --  1.19  TROPONINI  --  <0.30 0.36*    Estimated Creatinine Clearance: 107.2 ml/min (by C-G formula based on Cr of 1.19).   Medical History: Past Medical History  Diagnosis Date  . Dyslipidemia   . HTN (hypertension)   . CAD (coronary artery disease)     a. s/p PCI/BMS pRCA 2002; b. PCI pRCA & DES p/m RCA 2006; c. PCI/DES OM4 2007; d. PCI/CBA to RCA for ISR 10/2006; e.08/2012 STEMI/Cath/PCI: LM nl, LAD95-49m (3.0x20 Promus Prem DES), 95/95 ap LAD (1.0-1.32mm), LCX 30p 95d (sm), LPL1 70-80p, patent stent, LPL2 nl, RCA 30p, 40/70 ISR, EF 35-40%.  . Ischemic cardiomyopathy     a. EF 40%; improved to normal;  b. 08/2012 Echo: EF 50-55%, mod LVH.  [name] GERD (gastroesophageal reflux disease)   . Tobacco abuse   . Obesity   . OSA (obstructive sleep apnea)     Does not use CPAP as of 05/2011  . Depression   . History of pneumonia   . Diabetes mellitus     a. A1c 8.8 08/2012->Metformin initiated. => b. A1c (9/14): 6.6  . Hyperlipidemia     Medications:  Scheduled:  . amLODipine  10 mg Oral Daily  . aspirin  81 mg Oral Daily  . atorvastatin  40 mg Oral q1800  . carvedilol  25 mg Oral BID WC  . cloNIDine  0.2 mg Oral BID  . clopidogrel  75 mg Oral q morning - 10a  . escitalopram  10 mg Oral Daily  . famotidine  40 mg Oral  BID  . furosemide  40 mg Intravenous BID  . insulin aspart  0-15 Units Subcutaneous TID WC  . insulin aspart  0-5 Units Subcutaneous QHS  . isosorbide mononitrate  30 mg Oral Daily  . losartan  100 mg Oral Daily  . mometasone-formoterol  2 puff Inhalation BID  . potassium chloride  40 mEq Oral Q4H    Assessment: 58 yo male admitted with unstable angina, CHF.  Now found with mildly elevated troponin and pharmacy asked to begin anticoagulation with IV heparin.  Patient denies any history of bleeding.  Not taking any anticoagulants PTA.  Estimated CrCl ~ 100 ml/min.   Goal of Therapy:  Heparin level 0.3-0.7 units/ml Monitor platelets by anticoagulation protocol: Yes   Plan:  1. Start IV heparin with a bolus of 4000 units x1. 2. Then start heparin gtt at 1600 units/hr. 3. Check heparin level 6 hrs after heparin starts. 4. Daily heparin level and CBC. 5. F/u plans for further cardiac intervention.  Uvaldo Rising, BCPS  Clinical Pharmacist Pager [phone]  11/14/2013 9:10 AM

## 2013-11-14 NOTE — Progress Notes (Signed)
[place] for heparin Indication: [place]  Allergies  Allergen Reactions  . Penicillins Other (See Comments)    Unknown childhood reaction    Patient Measurements: Height: 6\' 1"  (185.4 cm) Weight: 338 lb 6.4 oz (153.497 kg) IBW/kg (Calculated) : 79.9 Heparin Dosing Weight: 116 kg  Vital Signs: Temp: 97.5 F (36.4 C) (06/06 1656) Temp src: Oral (06/06 1656) BP: 126/67 mmHg (06/06 1656) Pulse Rate: 52 (06/06 1656)  Labs:  Recent Labs  11/13/13 2043 11/13/13 2342 11/14/13 0635 11/14/13 1101 11/14/13 1622  HGB 14.2  --   --   --   --   HCT 42.1  --   --   --   --   PLT 176  --   --   --   --   LABPROT 12.7  --   --   --   --   INR 0.97  --   --   --   --   HEPARINUNFRC  --   --   --   --  0.14*  CREATININE 1.29  --  1.19  --   --   TROPONINI  --  <0.30 0.36* 0.54*  --     Estimated Creatinine Clearance: 107.2 ml/min (by C-G formula based on Cr of 1.19).   Medical History: Past Medical History  Diagnosis Date  . Dyslipidemia   . HTN (hypertension)   . CAD (coronary artery disease)     a. s/p PCI/BMS pRCA 2002; b. PCI pRCA & DES p/m RCA 2006; c. PCI/DES OM4 2007; d. PCI/CBA to RCA for ISR 10/2006; e.08/2012 STEMI/Cath/PCI: LM nl, LAD95-14m (3.0x20 Promus Prem DES), 95/95 ap LAD (1.0-1.14mm), LCX 30p 95d (sm), LPL1 70-80p, patent stent, LPL2 nl, RCA 30p, 40/70 ISR, EF 35-40%.  . Ischemic cardiomyopathy     a. EF 40%; improved to normal;  b. 08/2012 Echo: EF 50-55%, mod LVH.  [name] GERD (gastroesophageal reflux disease)   . Tobacco abuse   . Obesity   . OSA (obstructive sleep apnea)     Does not use CPAP as of 05/2011  . Depression   . History of pneumonia   . Diabetes mellitus     a. A1c 8.8 08/2012->Metformin initiated. => b. A1c (9/14): 6.6  . Hyperlipidemia     Medications:  Heparin @ 1600 units/hr  Assessment: 58 yo male admitted with unstable angina, CHF and then found with mildly elevated troponin and pharmacy asked to  begin anticoagulation with IV heparin. Initial level a little low at 0.14 units/mL. No issues with line per RN, gtt has not been stopped. No bleeding noted  Goal of Therapy:  Heparin level 0.3-0.7 units/ml Monitor platelets by anticoagulation protocol: Yes   Plan:  1. Rebolus heparin with 3000 units x1. 2. Increase heparin gtt to 1900 units/hr. 3. Check heparin level 6 hrs 4. Daily heparin level and CBC. 5. F/u plans for further cardiac intervention.  Duwan Adrian D. Denita Lun, PharmD, BCPS Clinical Pharmacist Pager: [phone] 11/14/2013 6:16 PM

## 2013-11-14 NOTE — Progress Notes (Signed)
Pt developed low grade recurrent CP. Will d/c Imdur and start IV NTG. Can use SL NTG as well. EKG reviewed with MD and not currently acute, but will continue to watch carefully. Will also order morphine PRN. If we cannot get pain free with med escalation, may need to move to unit. I don't think we will need urgent cath but it remains a consideration if unable to get pain free. Jaquail Mclees PA-C

## 2013-11-14 NOTE — Plan of Care (Signed)
Problem: Phase I Progression Outcomes Goal: Aspirin unless contraindicated Outcome: Completed/Met Date Met:  11/14/13 Taken prior to admission

## 2013-11-14 NOTE — Progress Notes (Signed)
Nutrition Consult   Received consult for diet education regarding obesity. Patient reports that he is also diabetic and is dealing with HF during this admission. He reports that he is illiterate. Discussed with patient tips to promote weight loss, appropriate portion sizes, ways to decrease sodium in his diet, high sodium foods to avoid. Patient is very interested in diet education as an outpatient. Will order. No further nutrition intervention needed at this time.  Molli Barrows, RD, LDN, [place] Pager [phone] After Hours Pager [phone]

## 2013-11-14 NOTE — Progress Notes (Signed)
Patient's chart reviewed.  Patient seen, interviewed and examined. Agree with note as outlined by Melina Copa PA.  Admitted with Cp typical of his angina and 3rd troponin is now elevated c/w NSTEMI.  He has not had any further CP.  Will plan on left heart cath on Monday.  For now continue IV Heparin gtt/ASA/statin/Imdur.  Will decrease beta blocker due to bradycardia.  He had some mild volume overload on admission so will continue IV lasix today and change to PO tomorrow.

## 2013-11-14 NOTE — Progress Notes (Signed)
Patient: [name] [name] / Admit Date: 11/13/2013 / Date of Encounter: 11/14/2013, 9:17 AM  Subjective  No further CP. (This was very responsive to NTG yesterday). LEE improving. Less orthopnea.  Objective   Telemetry: SB, upper 40s-50s SB 49bpm, known RBBB, nonspecific TW changes, similar to 09/2013  Physical Exam: Blood pressure 138/81, pulse 58, temperature 97.4 F (36.3 C), temperature source Oral, resp. rate 22, height 6\' 1"  (1.854 m), weight 338 lb 6.4 oz (153.497 kg), SpO2 97.00%. General: Well developed, well nourished obese AAM in no acute distress. Head: Normocephalic, atraumatic, sclera non-icteric, no xanthomas, nares are without discharge. Neck: Negative for carotid bruits. JVP mildly elevated. Lungs: Coarse BS at bases. No wheezes or rhonchi. Breathing is unlabored. Heart: Reg rhythm, sinus brady, S1 S2 without murmurs, rubs, or gallops.  Abdomen: Soft, non-tender, non-distended with normoactive bowel sounds. No rebound/guarding. Extremities: No clubbing or cyanosis. Tr BLE edema. Distal pedal pulses are 2+ and equal bilaterally. Neuro: Alert and oriented X 3. Moves all extremities spontaneously. Psych:  Responds to questions appropriately with a normal affect.   Intake/Output Summary (Last 24 hours) at 11/14/13 0917 Last data filed at 11/14/13 2505  Gross per 24 hour  Intake      0 ml  Output    300 ml  Net   -300 ml    Inpatient Medications:  . amLODipine  10 mg Oral Daily  . aspirin  81 mg Oral Daily  . atorvastatin  40 mg Oral q1800  . carvedilol  25 mg Oral BID WC  . cloNIDine  0.2 mg Oral BID  . clopidogrel  75 mg Oral q morning - 10a  . escitalopram  10 mg Oral Daily  . famotidine  40 mg Oral BID  . furosemide  40 mg Intravenous BID  . heparin  4,000 Units Intravenous Once  . insulin aspart  0-15 Units Subcutaneous TID WC  . insulin aspart  0-5 Units Subcutaneous QHS  . isosorbide mononitrate  30 mg Oral Daily  . losartan  100 mg Oral Daily  .  mometasone-formoterol  2 puff Inhalation BID  . potassium chloride  40 mEq Oral Q4H   Infusions:  . heparin      Labs:  Recent Labs  11/13/13 2043 11/14/13 0635  NA 143 143  K 3.4* 4.2  CL 105 104  CO2 26 26  GLUCOSE 150* 129*  BUN 18 16  CREATININE 1.29 1.19  CALCIUM 9.1 9.3    Recent Labs  11/13/13 2043  AST 19  ALT 21  ALKPHOS 77  BILITOT <0.2*  PROT 6.5  ALBUMIN 3.4*    Recent Labs  11/13/13 2043  WBC 8.8  HGB 14.2  HCT 42.1  MCV 86.1  PLT 176    Recent Labs  11/13/13 2342 11/14/13 0635  TROPONINI <0.30 0.36*    Radiology/Studies:  Dg Chest 2 View  11/13/2013   CLINICAL DATA:  Shortness of breath and chest pain  EXAM: CHEST  2 VIEW  COMPARISON:  September 28, 2013  FINDINGS: There is mild scarring in the right base. The interstitium in the lower lobes is mildly prominent but stable. There is no frank edema or consolidation. Heart size is upper normal with normal pulmonary vascularity. No adenopathy. No bone lesions.  IMPRESSION: Scarring in the right base. Probable chronic inflammatory change in both lower lobes, stable. No edema or consolidation. The cardiac silhouette is stable.   Electronically Signed   By: Lowella Grip M.D.   On: 11/13/2013  21:15     Assessment and Plan  1. CAD/NSTEMI s/p multiple prior PCIs (see history) - he received 324mg  ASA yesterday. IV heparin started this AM. Continue ASA, statin, Imdur, BB but lower dose due to sinus bradycardia. Will plan LHC on Monday, sooner if pain recurs. Risks and benefits of cardiac catheterization have been discussed with the patient. These include bleeding, infection, kidney damage, stroke, heart attack, death. The patient understands these risks and is willing to proceed. 2. Acute on chronic diastolic CHF - h/o ischemic cardiomyopathy - prior EF 40%, improved to 55-60% by echo 08/2012 - will continue IV Lasix through today and write to change to oral home dose in AM. 3. Hypertension -  See above.  Decreasing BB due to sinus brady. 4. HLD - continue statin. 5. Diabetes mellitus - holding Metformin, continue SSI. 6. OSA on CPAP (intermittently compliant) - will order this QHS. 7. Tobacco abuse - counseled regarding cessation. 8. Morbid obesity. Body mass index is 44.66 kg/(m^2). - dietician consult. 9. Hypokalemia - repleted. Start scheduled K tomorrow.  Signed, Melina Copa PA-C

## 2013-11-15 DIAGNOSIS — R001 Bradycardia, unspecified: Secondary | ICD-10-CM

## 2013-11-15 DIAGNOSIS — R0989 Other specified symptoms and signs involving the circulatory and respiratory systems: Secondary | ICD-10-CM

## 2013-11-15 DIAGNOSIS — I2589 Other forms of chronic ischemic heart disease: Secondary | ICD-10-CM

## 2013-11-15 DIAGNOSIS — R0609 Other forms of dyspnea: Secondary | ICD-10-CM

## 2013-11-15 LAB — CBC
HCT: 40.1 % (ref 39.0–52.0)
HEMOGLOBIN: 13.2 g/dL (ref 13.0–17.0)
MCH: 28.8 pg (ref 26.0–34.0)
MCHC: 32.9 g/dL (ref 30.0–36.0)
MCV: 87.4 fL (ref 78.0–100.0)
PLATELETS: 172 10*3/uL (ref 150–400)
RBC: 4.59 MIL/uL (ref 4.22–5.81)
RDW: 15.8 % — ABNORMAL HIGH (ref 11.5–15.5)
WBC: 8.7 10*3/uL (ref 4.0–10.5)

## 2013-11-15 LAB — BASIC METABOLIC PANEL
BUN: 17 mg/dL (ref 6–23)
CHLORIDE: 108 meq/L (ref 96–112)
CO2: 29 meq/L (ref 19–32)
CREATININE: 1.08 mg/dL (ref 0.50–1.35)
Calcium: 9 mg/dL (ref 8.4–10.5)
GFR calc non Af Amer: 75 mL/min — ABNORMAL LOW (ref >90)
GFR, EST AFRICAN AMERICAN: 87 mL/min — AB (ref >90)
Glucose, Bld: 119 mg/dL — ABNORMAL HIGH (ref 70–99)
Potassium: 3.9 mEq/L (ref 3.7–5.3)
SODIUM: 146 meq/L (ref 137–147)

## 2013-11-15 LAB — GLUCOSE, CAPILLARY
GLUCOSE-CAPILLARY: 122 mg/dL — AB (ref 70–99)
GLUCOSE-CAPILLARY: 108 mg/dL — AB (ref 70–99)
Glucose-Capillary: 138 mg/dL — ABNORMAL HIGH (ref 70–99)
Glucose-Capillary: 197 mg/dL — ABNORMAL HIGH (ref 70–99)

## 2013-11-15 LAB — TSH: TSH: 1.18 u[IU]/mL (ref 0.350–4.500)

## 2013-11-15 LAB — HEPARIN LEVEL (UNFRACTIONATED)
HEPARIN UNFRACTIONATED: 0.66 [IU]/mL (ref 0.30–0.70)
Heparin Unfractionated: 0.57 IU/mL (ref 0.30–0.70)

## 2013-11-15 MED ORDER — SODIUM CHLORIDE 0.9 % IV SOLN
INTRAVENOUS | Status: DC
  Administered 2013-11-16: 10 mL/h via INTRAVENOUS

## 2013-11-15 MED ORDER — ATORVASTATIN CALCIUM 80 MG PO TABS
80.0000 mg | ORAL_TABLET | Freq: Every day | ORAL | Status: DC
  Administered 2013-11-15 – 2013-11-16 (×2): 80 mg via ORAL
  Filled 2013-11-15 (×3): qty 1

## 2013-11-15 MED ORDER — TRAMADOL HCL 50 MG PO TABS
50.0000 mg | ORAL_TABLET | Freq: Two times a day (BID) | ORAL | Status: DC | PRN
  Administered 2013-11-15: 50 mg via ORAL
  Filled 2013-11-15: qty 1

## 2013-11-15 NOTE — Progress Notes (Signed)
1:42 AM   Heparin for [place]   Heparin level is 0.66 IU/ml on 1900 units/hr with no bleedind noted.   Continuing at current rate with daily F/u   Curlene Dolphin

## 2013-11-15 NOTE — Progress Notes (Signed)
Pt. Seen and examined. Agree with the NP/PA-C note as written.  Chest pain is almost totally resolved on medical regimen - no significant new EKG changes. Ok to continue plan for [facility] tomorrow. He is agreeable to this.  Pixie Casino, MD, [facility] Attending Cardiologist [place]

## 2013-11-15 NOTE — Progress Notes (Signed)
[place] for heparin Indication: [place]  Allergies  Allergen Reactions  . Penicillins Other (See Comments)    Unknown childhood reaction    Patient Measurements: Height: 6\' 1"  (185.4 cm) Weight: 337 lb 8 oz (153.089 kg) (scale A) IBW/kg (Calculated) : 79.9 Heparin Dosing Weight: 116 kg  Vital Signs: Temp: 97.9 F (36.6 C) (06/07 0610) Temp src: Oral (06/07 0610) BP: 135/71 mmHg (06/07 0610) Pulse Rate: 58 (06/07 0610)  Labs:  Recent Labs  11/13/13 2043 11/13/13 2342 11/14/13 0635 11/14/13 1101 11/14/13 1622 11/15/13 0047 11/15/13 0940  HGB 14.2  --   --   --   --  13.2  --   HCT 42.1  --   --   --   --  40.1  --   PLT 176  --   --   --   --  172  --   LABPROT 12.7  --   --   --   --   --   --   INR 0.97  --   --   --   --   --   --   HEPARINUNFRC  --   --   --   --  0.14* 0.66 0.57  CREATININE 1.29  --  1.19  --   --  1.08  --   TROPONINI  --  <0.30 0.36* 0.54*  --   --   --     Estimated Creatinine Clearance: 118 ml/min (by C-G formula based on Cr of 1.08).   Medical History: Past Medical History  Diagnosis Date  . Dyslipidemia   . HTN (hypertension)   . CAD (coronary artery disease)     a. s/p PCI/BMS pRCA 2002; b. PCI pRCA & DES p/m RCA 2006; c. PCI/DES OM4 2007; d. PCI/CBA to RCA for ISR 10/2006; e.08/2012 STEMI/Cath/PCI: LM nl, LAD95-59m (3.0x20 Promus Prem DES), 95/95 ap LAD (1.0-1.61mm), LCX 30p 95d (sm), LPL1 70-80p, patent stent, LPL2 nl, RCA 30p, 40/70 ISR, EF 35-40%.  . Ischemic cardiomyopathy     a. EF 40%; improved to normal;  b. 08/2012 Echo: EF 50-55%, mod LVH.  [name] GERD (gastroesophageal reflux disease)   . Tobacco abuse   . Obesity   . OSA (obstructive sleep apnea)     Does not use CPAP as of 05/2011  . Depression   . History of pneumonia   . Diabetes mellitus     a. A1c 8.8 08/2012->Metformin initiated. => b. A1c (9/14): 6.6  . Hyperlipidemia     Medications:  Heparin @ 1900 units/hr  Assessment: 58  yo male admitted with unstable angina, CHF and then found with mildly elevated troponin and pharmacy asked to begin anticoagulation with IV heparin. Heparin level has been therapeutic x 2 on 1900 units/hr. No bleeding noted.  CBC and platelet count stable.  Goal of Therapy:  Heparin level 0.3-0.7 units/ml Monitor platelets by anticoagulation protocol: Yes   Plan:  1. Continue IV heparin at current rate. 2. Continue daily heparin level and CBC. 3. F/U after cath lab tomorrow.  Uvaldo Rising, BCPS  Clinical Pharmacist Pager [phone]  11/15/2013 11:18 AM

## 2013-11-15 NOTE — Progress Notes (Signed)
Pt. Refuses CPAP at this time. Pt. Is aware to call anytime during the night if he changes his mind & decides to wear CPAP.

## 2013-11-15 NOTE — Progress Notes (Signed)
Patient: [name] [name] / Admit Date: 11/13/2013 / Date of Encounter: 11/15/2013, 7:21 AM  Subjective  CP has eased off with IV NTG. Has residual headache. Tylenol and tramadol made available. SOB and LEE has improved.  Objective   Telemetry: NSR (SB while asleep, upper 40s but has OSA) -  BB cut down yesterday PM  Physical Exam: Blood pressure 135/71, pulse 58, temperature 97.9 F (36.6 C), temperature source Oral, resp. rate 18, height 6\' 1"  (1.854 m), weight 337 lb 8 oz (153.089 kg), SpO2 95.00%. General: Well developed, well nourished obese AAM in no acute distress.  Head: Normocephalic, atraumatic, sclera non-icteric, no xanthomas, nares are without discharge.  Neck: Negative for carotid bruits. JVP mildly elevated.  Lungs: CTAB. No wheezes, rales or rhonchi. Breathing is unlabored.  Heart: RRR, S1 S2 without murmurs, rubs, or gallops.  Abdomen: Soft, non-tender, non-distended with normoactive bowel sounds. No rebound/guarding.  Extremities: No clubbing or cyanosis. No BLE edema. Distal pedal pulses are 2+ and equal bilaterally.  Neuro: Alert and oriented X 3. Moves all extremities spontaneously.  Psych: Responds to questions appropriately with a normal affect.   Intake/Output Summary (Last 24 hours) at 11/15/13 0721 Last data filed at 11/15/13 0331  Gross per 24 hour  Intake    720 ml  Output   2225 ml  Net  -1505 ml    Inpatient Medications:  . amLODipine  10 mg Oral Daily  . aspirin  81 mg Oral Daily  . atorvastatin  40 mg Oral q1800  . carvedilol  12.5 mg Oral BID WC  . cloNIDine  0.2 mg Oral BID  . clopidogrel  75 mg Oral Daily  . escitalopram  10 mg Oral Daily  . famotidine  40 mg Oral BID  . furosemide  60 mg Oral BID  . insulin aspart  0-15 Units Subcutaneous TID WC  . insulin aspart  0-5 Units Subcutaneous QHS  . losartan  100 mg Oral Daily  . mometasone-formoterol  2 puff Inhalation BID  . potassium chloride  20 mEq Oral Daily   Infusions:  . heparin 1,900  Units/hr (11/14/13 2319)  . nitroGLYCERIN 5 mcg/min (11/14/13 1746)    Labs:  Recent Labs  11/14/13 0635 11/15/13 0047  NA 143 146  K 4.2 3.9  CL 104 108  CO2 26 29  GLUCOSE 129* 119*  BUN 16 17  CREATININE 1.19 1.08  CALCIUM 9.3 9.0    Recent Labs  11/13/13 2043  AST 19  ALT 21  ALKPHOS 77  BILITOT <0.2*  PROT 6.5  ALBUMIN 3.4*    Recent Labs  11/13/13 2043 11/15/13 0047  WBC 8.8 8.7  HGB 14.2 13.2  HCT 42.1 40.1  MCV 86.1 87.4  PLT 176 172    Recent Labs  11/13/13 2342 11/14/13 0635 11/14/13 1101  TROPONINI <0.30 0.36* 0.54*     Radiology/Studies:  Dg Chest 2 View  11/13/2013   CLINICAL DATA:  Shortness of breath and chest pain  EXAM: CHEST  2 VIEW  COMPARISON:  September 28, 2013  FINDINGS: There is mild scarring in the right base. The interstitium in the lower lobes is mildly prominent but stable. There is no frank edema or consolidation. Heart size is upper normal with normal pulmonary vascularity. No adenopathy. No bone lesions.  IMPRESSION: Scarring in the right base. Probable chronic inflammatory change in both lower lobes, stable. No edema or consolidation. The cardiac silhouette is stable.   Electronically Signed   By: Gwyndolyn Saxon  Jasmine December M.D.   On: 11/13/2013 21:15     Assessment and Plan  1. CAD/NSTEMI s/p multiple prior PCIs (see history) - Continue ASA, statin, Imdur, BB, heparin but lower dose due to sinus bradycardia. Will plan LHC tomorrow, sooner if unable to keep pain free. Risks and benefits of cardiac catheterization have been discussed with the patient. These include bleeding, infection, kidney damage, stroke, heart attack, death. The patient understands these risks and is willing to proceed. Cath orders are in and I put him on the add-on board. 2. Acute on chronic diastolic CHF - h/o ischemic cardiomyopathy - prior EF 40%, improved to 55-60% by echo 08/2012 - change back to oral Lasix today in light of improvement in sx & cath tomorrow.  3.  Sinus bradycardia - improved with decreasing BB. TSH wnl. 4. Hypertension - See above.  5. HLD - check lipids in AM. Escalate statin dose given NSTEMI, DM. 6. Diabetes mellitus - holding Metformin, continue SSI.  7. OSA on CPAP (intermittently compliant) - continue QHS. 8. Tobacco abuse - counseled regarding cessation.  9. Morbid obesity. Body mass index is 44.66 kg/(m^2). - seen by dietician, patient interested in OP diet education which has been arranged. 10. Hypokalemia - improved. Continue scheduled K.  Signed, Melina Copa PA-C

## 2013-11-16 ENCOUNTER — Other Ambulatory Visit: Payer: Self-pay

## 2013-11-16 ENCOUNTER — Encounter (HOSPITAL_COMMUNITY)
Admission: EM | Disposition: A | Discharge status: Home / Self Care | Payer: Commercial Managed Care - HMO | Source: Home / Self Care | Attending: Cardiology

## 2013-11-16 DIAGNOSIS — I2510 Atherosclerotic heart disease of native coronary artery without angina pectoris: Secondary | ICD-10-CM

## 2013-11-16 DIAGNOSIS — I214 Non-ST elevation (NSTEMI) myocardial infarction: Secondary | ICD-10-CM

## 2013-11-16 DIAGNOSIS — I495 Sick sinus syndrome: Secondary | ICD-10-CM

## 2013-11-16 HISTORY — PX: PERCUTANEOUS CORONARY STENT INTERVENTION (PCI-S): SHX5485

## 2013-11-16 HISTORY — PX: LEFT HEART CATHETERIZATION WITH CORONARY ANGIOGRAM: SHX5451

## 2013-11-16 LAB — BASIC METABOLIC PANEL
BUN: 14 mg/dL (ref 6–23)
CHLORIDE: 106 meq/L (ref 96–112)
CO2: 27 meq/L (ref 19–32)
CREATININE: 1.07 mg/dL (ref 0.50–1.35)
Calcium: 9.2 mg/dL (ref 8.4–10.5)
GFR calc Af Amer: 88 mL/min — ABNORMAL LOW (ref >90)
GFR calc non Af Amer: 76 mL/min — ABNORMAL LOW (ref >90)
GLUCOSE: 152 mg/dL — AB (ref 70–99)
Potassium: 3.9 mEq/L (ref 3.7–5.3)
SODIUM: 144 meq/L (ref 137–147)

## 2013-11-16 LAB — CBC
HCT: 43.3 % (ref 39.0–52.0)
HEMOGLOBIN: 14.3 g/dL (ref 13.0–17.0)
MCH: 28.8 pg (ref 26.0–34.0)
MCHC: 33 g/dL (ref 30.0–36.0)
MCV: 87.3 fL (ref 78.0–100.0)
PLATELETS: 185 10*3/uL (ref 150–400)
RBC: 4.96 MIL/uL (ref 4.22–5.81)
RDW: 15.7 % — ABNORMAL HIGH (ref 11.5–15.5)
WBC: 8 10*3/uL (ref 4.0–10.5)

## 2013-11-16 LAB — HEPARIN LEVEL (UNFRACTIONATED): Heparin Unfractionated: 0.5 IU/mL (ref 0.30–0.70)

## 2013-11-16 LAB — LIPID PANEL
CHOL/HDL RATIO: 2.9 ratio
Cholesterol: 130 mg/dL (ref 0–200)
HDL: 45 mg/dL (ref >39)
LDL CALC: 68 mg/dL (ref 0–99)
TRIGLYCERIDES: 87 mg/dL (ref <150)
VLDL: 17 mg/dL (ref 0–40)

## 2013-11-16 LAB — GLUCOSE, CAPILLARY
Glucose-Capillary: 147 mg/dL — ABNORMAL HIGH (ref 70–99)
Glucose-Capillary: 97 mg/dL (ref 70–99)

## 2013-11-16 LAB — POCT ACTIVATED CLOTTING TIME: ACTIVATED CLOTTING TIME: 503 s

## 2013-11-16 SURGERY — LEFT HEART CATHETERIZATION WITH CORONARY ANGIOGRAM
Anesthesia: LOCAL

## 2013-11-16 MED ORDER — SODIUM CHLORIDE 0.9 % IV SOLN
INTRAVENOUS | Status: AC
  Administered 2013-11-16: 18:00:00 via INTRAVENOUS

## 2013-11-16 MED ORDER — ASPIRIN 81 MG PO CHEW
81.0000 mg | CHEWABLE_TABLET | ORAL | Status: AC
Start: 2013-11-16 — End: 2013-11-16
  Administered 2013-11-16: 81 mg via ORAL
  Filled 2013-11-16: qty 1

## 2013-11-16 NOTE — Progress Notes (Signed)
Patient evaluated for community based chronic disease management services with [facility] as a benefit of patient's [employer]. Spoke with patient at bedside to explain [place] [facility] services.  Written consent received for services.  His authorized contact is his friend [place] Cellar [phone].  Patient wants to have more information about how to manage his diabetic diet.  He specifically struggles with carbohydrate counting.  Recommended a nutritional consult.  Request unit secretary to reach out to chaplain services for advanced directives overview.  Patient will receive a post discharge transition of care call and will be evaluated for monthly home visits for assessments and disease process education.  Left contact information and [facility] literature at bedside. Made Inpatient Case Manager aware that [place] Management following. Of note, [facility] Care Management services does not replace or interfere with any services that are arranged by inpatient case management or social work.  For additional questions or referrals please contact Corliss Blacker BSN RN [facility] Liaison at [phone].

## 2013-11-16 NOTE — Progress Notes (Signed)
Pt had cath and will be going to [facility], belongings and bipap brought to new unit

## 2013-11-16 NOTE — Progress Notes (Signed)
Pt a/o, no c/o pain, pt oob ad lib, pt going for cath today, vss, pt stable

## 2013-11-16 NOTE — Progress Notes (Signed)
For re-cath today. Feels much better with diuresis. Markers have been relatively low and ECG abnormal but not acute. Renal function is normal.

## 2013-11-16 NOTE — Progress Notes (Signed)
The patient has been prepped for his cardiac cath today.  The consent is signed and in his chart.  His VS are stable and he did not have any complaints of pain overnight.

## 2013-11-16 NOTE — H&P (View-Only) (Signed)
Pt. Seen and examined. Agree with the NP/PA-C note as written.  Chest pain is almost totally resolved on medical regimen - no significant new EKG changes. Ok to continue plan for [facility] tomorrow. He is agreeable to this.  Pixie Casino, MD, [facility] Attending Cardiologist [place]

## 2013-11-16 NOTE — CV Procedure (Signed)
Cardiac Catheterization Procedure Note  Name: [name] [name] MRN: [id number] DOB: 01-18-1956  Procedure: Left Heart Cath, Selective Coronary Angiography, PTCA and stenting of the OM1  Indication: NSTEMI. Patient with extensive CAD status post multiple PCI procedures. Also with history of hypertensive heart disease, diastolic heart failure, medical noncompliance, and continued tobacco use. He has been reportedly taking his medicines as prescribed. He's developed chest pain shortness of breath, ruling in for non-ST elevation infarction on admission. Presenting now for cardiac catheterization and possible PCI.  Procedural Details:  The right wrist was prepped, draped, and anesthetized with 1% lidocaine. Using the modified Seldinger technique, a 5/6 French sheath was introduced into the right radial artery. 3 mg of verapamil was administered through the sheath, weight-based unfractionated heparin was administered intravenously. The procedure was technically challenging because of subclavian tortuosity. I have used 5 [place] guide catheters to reach the coronary arteries. Even with these Catheters in place, Amplatz superstiff guidewires had to be used to straighten the subclavian catheters would reach the right and left coronary arteries. Catheter exchanges were performed over an exchange length guidewire.  PROCEDURAL FINDINGS Hemodynamics: AO 132/77   Coronary angiography: Coronary dominance: right  Left mainstem: Widely patent without obstruction. Divides into the LAD left circumflex.  Left anterior descending (LAD): The LAD is patent throughout. There are diffuse luminal irregularities in the proximal vessel. The mid vessel has a widely patent stent. The diagonal branches are patent. The mid and distal LAD are patent with mild diffuse disease. The apical portion of the LAD has 75% stenosis.  Left circumflex (LCx): The left circumflex is patent. The vessel is large in its proximal aspect. There  are diffuse irregularities. The first OM is stented. Before the stented segment there is an eccentric 80% hypodense stenosis present. The second OM has 60-70% stenosis that involves the origin of the distal AV circumflex which has tight 80-90% stenosis. There were no major vessels beyond that area of stenosis in the distal AV circumflex.  Right coronary artery (RCA): The RCA in its proximal aspect is large. It tapers in the mid vessel in its distal vessels are fairly small in caliber. The proximal vessel has some tortuosity. The mid vessel is extensively stented. There is diffuse 70% in-stent restenosis present. The PDA is small. The acute marginal branch is small.  PCI Note:  Following the diagnostic procedure, the decision was made to proceed with PCI. I felt the OM branch of the left circumflex was the likely culprit. His disease in the right coronary artery is diffuse in the vessel is small. I think it is best to leave her medical treatment. The distal AV circumflex also supplies a small area of myocardium and I would recommend medical treatment for this. The patient has been on long-term aspirin and Plavix. Weight-based bivalirudin was given for anticoagulation. The procedure was performed through the 5 [place] guide catheter.  A cougar coronary guidewire was used to cross the lesion.  The lesion was predilated with a 2.5 mm balloon.  The lesion was then stented with a 2.75x15 mm Xience Alpine drug-eluting stent.  The stent was postdilated with a 3.0x12 mm noncompliant balloon to high pressure.  Following PCI, there was 0% residual stenosis and TIMI-3 flow. Final angiography confirmed an excellent result. The patient tolerated the procedure well. There were no immediate procedural complications. A TR band was used for radial hemostasis. The patient was transferred to the post catheterization recovery area for further monitoring.  PCI Data: Vessel -  OM1/Segment - proximal Percent Stenosis (pre)   0 TIMI-flow 3 Stent 2.75x15 mm Xience Alpine DES Percent Stenosis (post) 0 TIMI-flow (post) 3  Final Conclusions:   1. moderate diffuse in-stent restenosis in the right coronary artery, unchanged from the previous study 2. Continued patency of the LAD stented segment 3. Severe stenosis of the first obtuse marginal treated successfully with PCI using a drug-eluting stent   Recommendations:  Check 2-D echocardiogram to assess LV function. Continue dual antiplatelet therapy with aspirin and Plavix. Aggressive risk reduction of tobacco cessation. Patient eligible for discharge tomorrow after his echo was done.  Sherren Mocha 11/16/2013, 4:56 PM

## 2013-11-16 NOTE — H&P (View-Only) (Signed)
Patient: [name] [name] / Admit Date: 11/13/2013 / Date of Encounter: 11/15/2013, 7:21 AM  Subjective  CP has eased off with IV NTG. Has residual headache. Tylenol and tramadol made available. SOB and LEE has improved.  Objective   Telemetry: NSR (SB while asleep, upper 40s but has OSA) -  BB cut down yesterday PM  Physical Exam: Blood pressure 135/71, pulse 58, temperature 97.9 F (36.6 C), temperature source Oral, resp. rate 18, height 6\' 1"  (1.854 m), weight 337 lb 8 oz (153.089 kg), SpO2 95.00%. General: Well developed, well nourished obese AAM in no acute distress.  Head: Normocephalic, atraumatic, sclera non-icteric, no xanthomas, nares are without discharge.  Neck: Negative for carotid bruits. JVP mildly elevated.  Lungs: CTAB. No wheezes, rales or rhonchi. Breathing is unlabored.  Heart: RRR, S1 S2 without murmurs, rubs, or gallops.  Abdomen: Soft, non-tender, non-distended with normoactive bowel sounds. No rebound/guarding.  Extremities: No clubbing or cyanosis. No BLE edema. Distal pedal pulses are 2+ and equal bilaterally.  Neuro: Alert and oriented X 3. Moves all extremities spontaneously.  Psych: Responds to questions appropriately with a normal affect.   Intake/Output Summary (Last 24 hours) at 11/15/13 0721 Last data filed at 11/15/13 0331  Gross per 24 hour  Intake    720 ml  Output   2225 ml  Net  -1505 ml    Inpatient Medications:  . amLODipine  10 mg Oral Daily  . aspirin  81 mg Oral Daily  . atorvastatin  40 mg Oral q1800  . carvedilol  12.5 mg Oral BID WC  . cloNIDine  0.2 mg Oral BID  . clopidogrel  75 mg Oral Daily  . escitalopram  10 mg Oral Daily  . famotidine  40 mg Oral BID  . furosemide  60 mg Oral BID  . insulin aspart  0-15 Units Subcutaneous TID WC  . insulin aspart  0-5 Units Subcutaneous QHS  . losartan  100 mg Oral Daily  . mometasone-formoterol  2 puff Inhalation BID  . potassium chloride  20 mEq Oral Daily   Infusions:  . heparin 1,900  Units/hr (11/14/13 2319)  . nitroGLYCERIN 5 mcg/min (11/14/13 1746)    Labs:  Recent Labs  11/14/13 0635 11/15/13 0047  NA 143 146  K 4.2 3.9  CL 104 108  CO2 26 29  GLUCOSE 129* 119*  BUN 16 17  CREATININE 1.19 1.08  CALCIUM 9.3 9.0    Recent Labs  11/13/13 2043  AST 19  ALT 21  ALKPHOS 77  BILITOT <0.2*  PROT 6.5  ALBUMIN 3.4*    Recent Labs  11/13/13 2043 11/15/13 0047  WBC 8.8 8.7  HGB 14.2 13.2  HCT 42.1 40.1  MCV 86.1 87.4  PLT 176 172    Recent Labs  11/13/13 2342 11/14/13 0635 11/14/13 1101  TROPONINI <0.30 0.36* 0.54*     Radiology/Studies:  Dg Chest 2 View  11/13/2013   CLINICAL DATA:  Shortness of breath and chest pain  EXAM: CHEST  2 VIEW  COMPARISON:  September 28, 2013  FINDINGS: There is mild scarring in the right base. The interstitium in the lower lobes is mildly prominent but stable. There is no frank edema or consolidation. Heart size is upper normal with normal pulmonary vascularity. No adenopathy. No bone lesions.  IMPRESSION: Scarring in the right base. Probable chronic inflammatory change in both lower lobes, stable. No edema or consolidation. The cardiac silhouette is stable.   Electronically Signed   By: Gwyndolyn Saxon  Jasmine December M.D.   On: 11/13/2013 21:15     Assessment and Plan  1. CAD/NSTEMI s/p multiple prior PCIs (see history) - Continue ASA, statin, Imdur, BB, heparin but lower dose due to sinus bradycardia. Will plan LHC tomorrow, sooner if unable to keep pain free. Risks and benefits of cardiac catheterization have been discussed with the patient. These include bleeding, infection, kidney damage, stroke, heart attack, death. The patient understands these risks and is willing to proceed. Cath orders are in and I put him on the add-on board. 2. Acute on chronic diastolic CHF - h/o ischemic cardiomyopathy - prior EF 40%, improved to 55-60% by echo 08/2012 - change back to oral Lasix today in light of improvement in sx & cath tomorrow.  3.  Sinus bradycardia - improved with decreasing BB. TSH wnl. 4. Hypertension - See above.  5. HLD - check lipids in AM. Escalate statin dose given NSTEMI, DM. 6. Diabetes mellitus - holding Metformin, continue SSI.  7. OSA on CPAP (intermittently compliant) - continue QHS. 8. Tobacco abuse - counseled regarding cessation.  9. Morbid obesity. Body mass index is 44.66 kg/(m^2). - seen by dietician, patient interested in OP diet education which has been arranged. 10. Hypokalemia - improved. Continue scheduled K.  Signed, Melina Copa PA-C

## 2013-11-16 NOTE — Progress Notes (Signed)
Noted nutritional consult is complete with outpatient follow up recommendations.   Of note, [facility] Care Management services does not replace or interfere with any services that are arranged by inpatient case management or social work.  For additional questions or referrals please contact Corliss Blacker BSN RN [facility] Liaison at [phone].

## 2013-11-16 NOTE — Progress Notes (Signed)
[place] for heparin Indication: [place]  Allergies  Allergen Reactions  . Penicillins Other (See Comments)    Unknown childhood reaction    Patient Measurements: Height: 6\' 1"  (185.4 cm) Weight: 332 lb 0.2 oz (150.6 kg) IBW/kg (Calculated) : 79.9 Heparin Dosing Weight: 116 kg  Vital Signs: Temp: 98.2 F (36.8 C) (06/08 0626) Temp src: Oral (06/08 0626) BP: 130/72 mmHg (06/08 0626) Pulse Rate: 49 (06/08 0626)  Labs:  Recent Labs  11/13/13 2043 11/13/13 2342 11/14/13 0635 11/14/13 1101  11/15/13 0047 11/15/13 0940 11/16/13 0343  HGB 14.2  --   --   --   --  13.2  --  14.3  HCT 42.1  --   --   --   --  40.1  --  43.3  PLT 176  --   --   --   --  172  --  185  LABPROT 12.7  --   --   --   --   --   --   --   INR 0.97  --   --   --   --   --   --   --   HEPARINUNFRC  --   --   --   --   < > 0.66 0.57 0.50  CREATININE 1.29  --  1.19  --   --  1.08  --  1.07  TROPONINI  --  <0.30 0.36* 0.54*  --   --   --   --   < > = values in this interval not displayed.  Estimated Creatinine Clearance: 118 ml/min (by C-G formula based on Cr of 1.07).   Medical History: Past Medical History  Diagnosis Date  . Dyslipidemia   . HTN (hypertension)   . CAD (coronary artery disease)     a. s/p PCI/BMS pRCA 2002; b. PCI pRCA & DES p/m RCA 2006; c. PCI/DES OM4 2007; d. PCI/CBA to RCA for ISR 10/2006; e.08/2012 STEMI/Cath/PCI: LM nl, LAD95-61m (3.0x20 Promus Prem DES), 95/95 ap LAD (1.0-1.33mm), LCX 30p 95d (sm), LPL1 70-80p, patent stent, LPL2 nl, RCA 30p, 40/70 ISR, EF 35-40%.  . Ischemic cardiomyopathy     a. EF 40%; improved to normal;  b. 08/2012 Echo: EF 50-55%, mod LVH.  [name] GERD (gastroesophageal reflux disease)   . Tobacco abuse   . Obesity   . OSA (obstructive sleep apnea)     Does not use CPAP as of 05/2011  . Depression   . History of pneumonia   . Diabetes mellitus     a. A1c 8.8 08/2012->Metformin initiated. => b. A1c (9/14): 6.6  .  Hyperlipidemia     Medications:  Heparin @ 1900 units/hr  Assessment: 58 yo male admitted with unstable angina, CHF and then found with mildly elevated troponin and pharmacy asked to begin anticoagulation with IV heparin.   Coag: Heparin level has been therapeutic x 3 on 1900 units/hr. No bleeding noted.  CBC and platelet count stable.  CV: ACS, + troponin for cath today HF 50s, SBP 140-130s [place], asa, atorva, coreg, clonicine, plavix, furo 60 po bid, losartan, K 20 daily  Endo:  DM, SSI, glucose 150-200  Renal: CrCl > 100 ml/min good UOP  Goal of Therapy:  Heparin level 0.3-0.7 units/ml Monitor platelets by anticoagulation protocol: Yes   Plan:  1. Continue IV heparin at current rate. 2. Continue daily heparin level and CBC. 3. F/U after cath lab.  Thank you for allowing  pharmacy to be a part of this patients care team.  Rowe Robert Pharm.D., BCPS, AQ-Cardiology Clinical Pharmacist 11/16/2013 8:52 AM Pager: [phone] Phone: [phone]

## 2013-11-16 NOTE — Progress Notes (Signed)
Subjective: No pain, no SOB, no dizziness  Objective: Vital signs in last 24 hours: Temp:  [97 F (36.1 C)-98.3 F (36.8 C)] 98.2 F (36.8 C) (06/08 0626) Pulse Rate:  [49-90] 90 (06/08 1000) Resp:  [18] 18 (06/08 0626) BP: (130-147)/(67-81) 137/76 mmHg (06/08 1000) SpO2:  [95 %-98 %] 98 % (06/08 0626) Weight:  [332 lb 0.2 oz (150.6 kg)] 332 lb 0.2 oz (150.6 kg) (06/08 0626) Weight change: -5 lb 7.8 oz (-2.489 kg) Last BM Date: 11/15/13 Intake/Output from previous day: -3106 (total since admit -4545) wt 332 down from 340. 06/07 0701 - 06/08 0700 In: 1223.5 [P.O.:1080; I.V.:143.5] Out: 4350 [Urine:4350] Intake/Output this shift: Total I/O In: 600 [P.O.:600] Out: 501 [Urine:500; Stool:1]  PE: General:Pleasant affect, NAD Skin:Warm and dry, brisk capillary refill HEENT:normocephalic, sclera clear, mucus membranes moist Neck:supple, no JVD, no bruits  Heart:S1S2 RRR without murmur, gallup, rub or click Lungs:clear without rales, rhonchi, or wheezes EQA:STMH, non tender, + BS, do not palpate liver spleen or masses Ext:no lower ext edema, 2+ pedal pulses, 2+ radial pulses Neuro:alert and oriented, MAE, follows commands, + facial symmetry   Lab Results:  Recent Labs  11/15/13 0047 11/16/13 0343  WBC 8.7 8.0  HGB 13.2 14.3  HCT 40.1 43.3  PLT 172 185   BMET  Recent Labs  11/15/13 0047 11/16/13 0343  NA 146 144  K 3.9 3.9  CL 108 106  CO2 29 27  GLUCOSE 119* 152*  BUN 17 14  CREATININE 1.08 1.07  CALCIUM 9.0 9.2    Recent Labs  11/14/13 0635 11/14/13 1101  TROPONINI 0.36* 0.54*    Lab Results  Component Value Date   CHOL 130 11/16/2013   HDL 45 11/16/2013   LDLCALC 68 11/16/2013   TRIG 87 11/16/2013   CHOLHDL 2.9 11/16/2013   Lab Results  Component Value Date   HGBA1C 6.6* 06/29/2013     Lab Results  Component Value Date   TSH 1.180 11/15/2013    Hepatic Function Panel  Recent Labs  11/13/13 2043  PROT 6.5  ALBUMIN 3.4*  AST 19    ALT 21  ALKPHOS 77  BILITOT <0.2*    Recent Labs  11/16/13 0343  CHOL 130   No results found for this basename: PROTIME,  in the last 72 hours     Studies/Results: No results found.  Medications: I have reviewed the patient's current medications. Scheduled Meds: . amLODipine  10 mg Oral Daily  . aspirin  81 mg Oral Daily  . atorvastatin  80 mg Oral q1800  . carvedilol  12.5 mg Oral BID WC  . cloNIDine  0.2 mg Oral BID  . clopidogrel  75 mg Oral Daily  . escitalopram  10 mg Oral Daily  . famotidine  40 mg Oral BID  . furosemide  60 mg Oral BID  . insulin aspart  0-15 Units Subcutaneous TID WC  . insulin aspart  0-5 Units Subcutaneous QHS  . losartan  100 mg Oral Daily  . mometasone-formoterol  2 puff Inhalation BID  . potassium chloride  20 mEq Oral Daily   Continuous Infusions: . sodium chloride 10 mL/hr (11/16/13 0629)  . heparin 1,900 Units/hr (11/16/13 0422)  . nitroGLYCERIN 5 mcg/min (11/14/13 1746)   PRN Meds:.acetaminophen, morphine injection, nitroGLYCERIN, traMADol  Assessment/Plan: Principal Problem:   NSTEMI (non-ST elevated myocardial infarction) pk troponin 0.66 for cardiac cath today  Active Problems:   CAD (coronary artery disease)- s/p  PCI to the RCA and PL1, cutting balloon angioplasty secondary to in-stent restenosis of the RCA in 5/08, ischemic cardiomyopathy with previous EF 40%, improved to 55-60%, HTN, HL. Myoview 9/12: mod scar in base/mid inf and IL segments, slight peri-infarct ischemia - low risk (med Rx continued). He was admitted 3/14 with an anterior STEMI treated with a Promus DES to the LAD. He did have residual disease in the circumflex with 70-80% in the PL1 branch prior to the previous stent (stent was patent), continuation of AV groove circumflex 95%, mid RCA stent 40% (proximal portion) and 70% (more distal portion). Medical therapy was recommended. EF was 35-40%.     Acute on chronic diastolic congestive heart failure    OBSTRUCTIVE SLEEP APNEA   Essential hypertension, benign   Obesity, Class III, BMI 40-49.9 (morbid obesity)   DM (diabetes mellitus)   Hyperlipidemia  Lipid Panel     Component Value Date/Time   CHOL 130 11/16/2013 0343   TRIG 87 11/16/2013 0343   HDL 45 11/16/2013 0343   CHOLHDL 2.9 11/16/2013 0343   VLDL 17 11/16/2013 0343   LDLCALC 68 11/16/2013 0343      Sinus bradycardia was in the 40s now 47- 55 with decrease in coreg from 25 mg BID to 12.5 BID, no further dizziness. May need to decrease BB more.    LOS: 3 days   Time spent with pt. :15 minutes. Cecilie Kicks  Nurse Practitioner Certified Pager [phone] or after 5pm and on weekends call [phone] 11/16/2013, 12:24 PM

## 2013-11-16 NOTE — Interval H&P Note (Signed)
History and Physical Interval Note:  11/16/2013 3:38 PM  [name] [name]  has presented today for surgery, with the diagnosis of chest pain  The various methods of treatment have been discussed with the patient and family. After consideration of risks, benefits and other options for treatment, the patient has consented to  Procedure(s): LEFT HEART CATHETERIZATION WITH CORONARY ANGIOGRAM (N/A) as a surgical intervention .  The patient's history has been reviewed, patient examined, no change in status, stable for surgery.  I have reviewed the patient's chart and labs.  Questions were answered to the patient's satisfaction.    Cath Lab Visit (complete for each Cath Lab visit)  Clinical Evaluation Leading to the Procedure:   ACS: yes  Non-ACS:    Anginal Classification: CCS IV  Anti-ischemic medical therapy: Maximal Therapy (2 or more classes of medications)  Non-Invasive Test Results: No non-invasive testing performed  Prior CABG: No previous CABG       Sherren Mocha

## 2013-11-17 DIAGNOSIS — G4733 Obstructive sleep apnea (adult) (pediatric): Secondary | ICD-10-CM

## 2013-11-17 DIAGNOSIS — E119 Type 2 diabetes mellitus without complications: Secondary | ICD-10-CM

## 2013-11-17 DIAGNOSIS — I369 Nonrheumatic tricuspid valve disorder, unspecified: Secondary | ICD-10-CM

## 2013-11-17 LAB — CBC
HCT: 43.2 % (ref 39.0–52.0)
Hemoglobin: 14.3 g/dL (ref 13.0–17.0)
MCH: 28.8 pg (ref 26.0–34.0)
MCHC: 33.1 g/dL (ref 30.0–36.0)
MCV: 86.9 fL (ref 78.0–100.0)
PLATELETS: 172 10*3/uL (ref 150–400)
RBC: 4.97 MIL/uL (ref 4.22–5.81)
RDW: 15.7 % — AB (ref 11.5–15.5)
WBC: 7 10*3/uL (ref 4.0–10.5)

## 2013-11-17 LAB — BASIC METABOLIC PANEL
BUN: 12 mg/dL (ref 6–23)
CO2: 25 mEq/L (ref 19–32)
Calcium: 9.1 mg/dL (ref 8.4–10.5)
Chloride: 102 mEq/L (ref 96–112)
Creatinine, Ser: 0.97 mg/dL (ref 0.50–1.35)
Glucose, Bld: 117 mg/dL — ABNORMAL HIGH (ref 70–99)
POTASSIUM: 3.7 meq/L (ref 3.7–5.3)
Sodium: 140 mEq/L (ref 137–147)

## 2013-11-17 LAB — GLUCOSE, CAPILLARY
Glucose-Capillary: 78 mg/dL (ref 70–99)
Glucose-Capillary: 124 mg/dL — ABNORMAL HIGH (ref 70–99)
Glucose-Capillary: 125 mg/dL — ABNORMAL HIGH (ref 70–99)
Glucose-Capillary: 114 mg/dL — ABNORMAL HIGH (ref 70–99)

## 2013-11-17 MED ORDER — INSULIN ASPART 100 UNIT/ML ~~LOC~~ SOLN: 20.0000 [IU] | Freq: Once | SUBCUTANEOUS | Status: DC

## 2013-11-17 MED ORDER — CARVEDILOL 12.5 MG PO TABS: 12.5000 mg | ORAL_TABLET | Freq: Two times a day (BID) | ORAL | Status: DC

## 2013-11-17 MED ORDER — NITROGLYCERIN 0.4 MG SL SUBL: 0.4000 mg | SUBLINGUAL_TABLET | SUBLINGUAL | Status: DC | PRN

## 2013-11-17 MED ORDER — CLOPIDOGREL BISULFATE 75 MG PO TABS: 75.0000 mg | ORAL_TABLET | Freq: Every morning | ORAL | Status: DC

## 2013-11-17 MED ORDER — METFORMIN HCL 500 MG PO TABS: 500.0000 mg | ORAL_TABLET | Freq: Every day | ORAL | Status: DC

## 2013-11-17 MED FILL — Heparin Sodium (Porcine) 2 Unit/ML in Sodium Chloride 0.9%: INTRAMUSCULAR | Qty: 1500 | Status: AC

## 2013-11-17 MED FILL — Fentanyl Citrate Inj 0.05 MG/ML: INTRAMUSCULAR | Qty: 2 | Status: AC

## 2013-11-17 MED FILL — Verapamil HCl IV Soln 2.5 MG/ML: INTRAVENOUS | Qty: 2 | Status: AC

## 2013-11-17 MED FILL — Nitroglycerin IV Soln 200 MCG/ML in D5W: INTRAVENOUS | Qty: 1 | Status: AC

## 2013-11-17 MED FILL — Midazolam HCl Inj 2 MG/2ML (Base Equivalent): INTRAMUSCULAR | Qty: 2 | Status: AC

## 2013-11-17 MED FILL — Sodium Chloride IV Soln 0.9%: INTRAVENOUS | Qty: 50 | Status: AC

## 2013-11-17 MED FILL — Lidocaine HCl Local Preservative Free (PF) Inj 1%: INTRAMUSCULAR | Qty: 30 | Status: AC

## 2013-11-17 MED FILL — Bivalirudin Trifluoroacetate For IV Soln 250 MG (Base Equiv): INTRAVENOUS | Qty: 250 | Status: AC

## 2013-11-17 NOTE — Progress Notes (Signed)
Patient Name: [name] [name] Date of Encounter: 11/17/2013  Principal Problem:   NSTEMI (non-ST elevated myocardial infarction) Active Problems:   OBSTRUCTIVE SLEEP APNEA   Essential hypertension, benign   Obesity, Class III, BMI 40-49.9 (morbid obesity)   DM (diabetes mellitus)   Acute on chronic diastolic congestive heart failure   CAD (coronary artery disease)   Hyperlipidemia   Sinus bradycardia    Patient Profile: 58 yo male w/ hx CAD, ICM, OSA/CPAP, HTN, HLD, DM was admitted 06/06 w/ NSTEMI. Cath 06/08 w/ DES OM1, o/w medical rx.   SUBJECTIVE: No chest pain, no SOB. Dislikes CPAP mask because it blows in his face.   OBJECTIVE Filed Vitals:   11/17/13 0420 11/17/13 0500 11/17/13 0530 11/17/13 0828  BP:   117/59 153/79  Pulse: 54  51 59  Temp:   97.8 F (36.6 C) 98.7 F (37.1 C)  TempSrc:   Oral Oral  Resp:   17 18  Height:      Weight:  333 lb 8.9 oz (151.3 kg)    SpO2: 98%  99% 100%    Intake/Output Summary (Last 24 hours) at 11/17/13 0837 Last data filed at 11/17/13 0600  Gross per 24 hour  Intake 2108.75 ml  Output   3201 ml  Net -1092.25 ml   Filed Weights   11/16/13 0626 11/17/13 0011 11/17/13 0500  Weight: 332 lb 0.2 oz (150.6 kg) 333 lb 8.9 oz (151.3 kg) 333 lb 8.9 oz (151.3 kg)    PHYSICAL EXAM General: Well developed, well nourished, male in no acute distress. Head: Normocephalic, atraumatic.  Neck: Supple without bruits, JVD not elevated. Lungs:  Resp regular and unlabored, CTA. Heart: RRR, S1, S2, no S3, S4, or murmur; no rub. Abdomen: Soft, non-tender, non-distended, BS + x 4.  Extremities: No clubbing, cyanosis, no edema. Cath site right radial w/out hematoma/ecchymosis Neuro: Alert and oriented X 3. Moves all extremities spontaneously. Psych: Normal affect.  LABS: CBC: Recent Labs  11/16/13 0343 11/17/13 0312  WBC 8.0 7.0  HGB 14.3 14.3  HCT 43.3 43.2  MCV 87.3 86.9  PLT 185 948   Basic Metabolic Panel: Recent Labs  11/16/13 0343 11/17/13 0312  NA 144 140  K 3.9 3.7  CL 106 102  CO2 27 25  GLUCOSE 152* 117*  BUN 14 12  CREATININE 1.07 0.97  CALCIUM 9.2 9.1   Cardiac Enzymes: Recent Labs  11/14/13 1101  TROPONINI 0.54*   BNP: Pro B Natriuretic peptide (BNP)  Date/Time Value Ref Range Status  11/13/2013  9:55 PM 211.6* 0 - 125 pg/mL Final  09/28/2013  9:33 AM 244.0* 0 - 125 pg/mL Final   Fasting Lipid Panel: Recent Labs  11/16/13 0343  CHOL 130  HDL 45  LDLCALC 68  TRIG 87  CHOLHDL 2.9   Thyroid Function Tests: Recent Labs  11/15/13 0047  TSH 1.180    TELE:  SR, sinus brady occasionally 40s     Current Medications:  . amLODipine  10 mg Oral Daily  . aspirin  81 mg Oral Daily  . atorvastatin  80 mg Oral q1800  . carvedilol  12.5 mg Oral BID WC  . cloNIDine  0.2 mg Oral BID  . clopidogrel  75 mg Oral Daily  . escitalopram  10 mg Oral Daily  . famotidine  40 mg Oral BID  . furosemide  60 mg Oral BID  . insulin aspart  0-15 Units Subcutaneous TID WC  . insulin aspart  0-5  Units Subcutaneous QHS  . losartan  100 mg Oral Daily  . mometasone-formoterol  2 puff Inhalation BID  . potassium chloride  20 mEq Oral Daily      ASSESSMENT AND PLAN: Principal Problem:   NSTEMI (non-ST elevated myocardial infarction) - had PCI to the OM1 w/ DES; on ASA, BB, statin, ARB. Echo pending to assess EF  Active Problems:   OBSTRUCTIVE SLEEP APNEA - have RT come up and work with him on mask.    Essential hypertension, benign - continue Rx    Obesity, Class III, BMI 40-49.9 (morbid obesity) - encourage DM diet     DM (diabetes mellitus) - encourage diet, med compliance    Acute on chronic diastolic congestive heart failure - volume status OK now.     CAD (coronary artery disease) - see above    Hyperlipidemia - OK on home rx    Sinus bradycardia - on clonidine and Coreg, no sx, med changes per MD. HR 40s at times.  Plan - d/c today.   Signed, Lonn Georgia , PA-C 8:37  AM 11/17/2013  Patient seen and examined and history reviewed. Agree with above findings and plan. Patient without complaints today. Denies chest pain or SOB. Weight is stable. Good diuresis. Renal function stable post cath. Radial site without hematoma. Awaiting Echo results today. Plan DC today. I think bradycardia last night related to sleep apnea. I would not change antihypertensive therapy now. Stressed importance of medication compliance. Low sodium diet. Smoking cessation counseled.   Peter M Martinique, [place] 11/17/2013 9:03 AM

## 2013-11-17 NOTE — Progress Notes (Signed)
CARDIAC REHAB PHASE I   PRE:  Rate/Rhythm: 56 sB    BP: sitting 153/79    SaO2: 96 RA  MODE:  Ambulation: 540 ft   POST:  Rate/Rhythm: 70 SR    BP: sitting 165/78     SaO2: 99 RA  Tolerated well. C/o that it was a lot for him.  In depth ed re taking control of his health. Gave HF, MI, stent, NTG, smoking, diet, daily wts, low sodium, and CRPII information. Interested in [place] and will send referral to [facility]. Pt sts that he did not realize how important everything was.  [place], ACSM 11/17/2013 9:41 AM

## 2013-11-17 NOTE — Progress Notes (Signed)
Patient refused CPAP for tonight, and asked that it be pulled from room as he does not feel he needs it now.  Machine removed from room, nurse aware.

## 2013-11-17 NOTE — Progress Notes (Signed)
  Echocardiogram 2D Echocardiogram has been performed.  [name] [name] 11/17/2013, 3:21 PM

## 2013-11-17 NOTE — Discharge Instructions (Signed)
PLEASE REMEMBER TO BRING ALL OF YOUR MEDICATIONS TO EACH OF YOUR FOLLOW-UP OFFICE VISITS. ° °PLEASE ATTEND ALL SCHEDULED FOLLOW-UP APPOINTMENTS.  ° °Activity: Increase activity slowly as tolerated. You may shower, but no soaking baths (or swimming) for 1 week. No driving for 2 days. No lifting over 5 lbs for 1 week. No sexual activity for 1 week.  ° °You May Return to Work: in 1 week (if applicable) ° °Wound Care: You may wash cath site gently with soap and water. Keep cath site clean and dry. If you notice pain, swelling, bleeding or pus at your cath site, please call [phone]. ° ° ° °Cardiac Cath Site Care °Refer to this sheet in the next few weeks. These instructions provide you with information on caring for yourself after your procedure. Your caregiver may also give you more specific instructions. Your treatment has been planned according to current medical practices, but problems sometimes occur. Call your caregiver if you have any problems or questions after your procedure. °HOME CARE INSTRUCTIONS °· You may shower 24 hours after the procedure. Remove the bandage (dressing) and gently wash the site with plain soap and water. Gently pat the site dry.  °· Do not apply powder or lotion to the site.  °· Do not sit in a bathtub, swimming pool, or whirlpool for 5 to 7 days.  °· No bending, squatting, or lifting anything over 10 pounds (4.5 kg) as directed by your caregiver.  °· Inspect the site at least twice daily.  °· Do not drive home if you are discharged the same day of the procedure. Have someone else drive you.  °· You may drive 24 hours after the procedure unless otherwise instructed by your caregiver.  °What to expect: °· Any bruising will usually fade within 1 to 2 weeks.  °· Blood that collects in the tissue (hematoma) may be painful to the touch. It should usually decrease in size and tenderness within 1 to 2 weeks.  °SEEK IMMEDIATE MEDICAL CARE IF: °· You have unusual pain at the site or down the  affected limb.  °· You have redness, warmth, swelling, or pain at the site.  °· You have drainage (other than a small amount of blood on the dressing).  °· You have chills.  °· You have a fever or persistent symptoms for more than 72 hours.  °· You have a fever and your symptoms suddenly get worse.  °· Your leg becomes pale, cool, tingly, or numb.  °· You have heavy bleeding from the site. Hold pressure on the site.  °Document Released: 06/30/2010 Document Revised: 05/17/2011 Document Reviewed: 06/30/2010 °ExitCare® Patient Information ©2012 [employer]. ° °

## 2013-11-17 NOTE — Progress Notes (Signed)
Pt having repeated and sustained instances of desaturation with SPO2 down to lower and mid 80s. Pt also becomes bradycardic at same time with HR in low 40s. Also frequent ectopy involving PVCs and [place]. Pt placed on [place] at 2L. Discussed with patient need to use CPAP both here and at home. Will continue to monitor

## 2013-11-17 NOTE — Discharge Summary (Signed)
CARDIOLOGY DISCHARGE SUMMARY   Patient ID: [name] [name] MRN: [id number] DOB/AGE: [birth year] 58 y.o.  Admit date: 11/13/2013 Discharge date: 11/17/2013  PCP: Cathlean Cower, MD Primary Cardiologist: Dr. [name]  Primary Discharge Diagnosis:  NSTEMI (non-ST elevated myocardial infarction) - s/p 2.75x15 mm Xience Alpine DES to the [place]  Secondary Discharge Diagnosis:    OBSTRUCTIVE SLEEP APNEA   Essential hypertension, benign   Obesity, Class III, BMI 40-49.9 (morbid obesity)   DM (diabetes mellitus)   Acute on chronic diastolic congestive heart failure   CAD (coronary artery disease)   Hyperlipidemia   Sinus bradycardia  Procedures: Left Heart Cath, Selective Coronary Angiography, PTCA and stenting of the OM1, 2-D echocardiogram   Hospital Course: [name] [name] is a 58 y.o. male with a history of CAD. He had chest pain that was recurrent and improved by nitroglycerin. He came to the emergency room and was treated medically. His symptoms improved and he was admitted for further evaluation and treatment.  His initial point-of-care troponin was negative but subsequent troponins were elevated, indicating a non-STEMI. He was maintained pain free on medical therapy and did well. His blood sugars were managed with sliding scale insulin and his metformin was held.   He had acute on chronic diastolic CHF and was treated with IV Lasix. His weight decreased by 7 pounds during his hospital stay. As his weight decreased his respiratory status improved. He was noted to have some bradycardia but this was mainly during sleep and he was also noted to have sleep apnea. He was seen by respiratory therapy but did not consistently wear CPAP during his stay. He is encouraged to continue CPAP as an outpatient.  He was stable from a respiratory standpoint and was taken to the cath lab on 06/08. Results are below. He had a stent to the OM1 and tolerated it well. He has a drug-eluting stent and is to continue on  aspirin and will and a for 12 months. An echocardiogram was ordered to evaluate his left ventricular function. Results are below.  He was seen by cardiac rehabilitation and educated on MI restrictions, heart-healthy lifestyle modifications and exercise guidelines. He is encouraged to followup with rehabilitation as an outpatient.  On 06/09, he was seen by Dr. Martinique and all data were reviewed. No further inpatient workup is indicated. He was ambulating without chest pain or shortness of breath and his volume status is stable. He is considered stable for discharge, in improved condition, to follow up as an outpatient.  Labs:   Lab Results  Component Value Date   WBC 7.0 11/17/2013   HGB 14.3 11/17/2013   HCT 43.2 11/17/2013   MCV 86.9 11/17/2013   PLT 172 11/17/2013    Recent Labs Lab 11/13/13 2043  11/17/13 0312  NA 143  < > 140  K 3.4*  < > 3.7  CL 105  < > 102  CO2 26  < > 25  BUN 18  < > 12  CREATININE 1.29  < > 0.97  CALCIUM 9.1  < > 9.1  PROT 6.5  --   --   BILITOT <0.2*  --   --   ALKPHOS 77  --   --   ALT 21  --   --   AST 19  --   --   GLUCOSE 150*  < > 117*  < > = values in this interval not displayed.  Recent Labs  11/14/13 1101  TROPONINI 0.54*   Lipid  Panel     Component Value Date/Time   CHOL 130 11/16/2013 0343   TRIG 87 11/16/2013 0343   HDL 45 11/16/2013 0343   CHOLHDL 2.9 11/16/2013 0343   VLDL 17 11/16/2013 0343   LDLCALC 68 11/16/2013 0343    Pro B Natriuretic peptide (BNP)  Date/Time Value Ref Range Status  11/13/2013  9:55 PM 211.6* 0 - 125 pg/mL Final  09/28/2013  9:33 AM 244.0* 0 - 125 pg/mL Final    Radiology: Dg Chest 2 View 11/13/2013   CLINICAL DATA:  Shortness of breath and chest pain  EXAM: CHEST  2 VIEW  COMPARISON:  September 28, 2013  FINDINGS: There is mild scarring in the right base. The interstitium in the lower lobes is mildly prominent but stable. There is no frank edema or consolidation. Heart size is upper normal with normal pulmonary vascularity. No  adenopathy. No bone lesions.  IMPRESSION: Scarring in the right base. Probable chronic inflammatory change in both lower lobes, stable. No edema or consolidation. The cardiac silhouette is stable.   Electronically Signed   By: Lowella Grip M.D.   On: 11/13/2013 21:15    Cardiac Cath: 11/16/2013 Left mainstem: Widely patent without obstruction. Divides into the LAD left circumflex.  Left anterior descending (LAD): The LAD is patent throughout. There are diffuse luminal irregularities in the proximal vessel. The mid vessel has a widely patent stent. The diagonal branches are patent. The mid and distal LAD are patent with mild diffuse disease. The apical portion of the LAD has 75% stenosis.  Left circumflex (LCx): The left circumflex is patent. The vessel is large in its proximal aspect. There are diffuse irregularities. The first OM is stented. Before the stented segment there is an eccentric 80% hypodense stenosis present. The second OM has 60-70% stenosis that involves the origin of the distal AV circumflex which has tight 80-90% stenosis. There were no major vessels beyond that area of stenosis in the distal AV circumflex.  Right coronary artery (RCA): The RCA in its proximal aspect is large. It tapers in the mid vessel in its distal vessels are fairly small in caliber. The proximal vessel has some tortuosity. The mid vessel is extensively stented. There is diffuse 70% in-stent restenosis present. The PDA is small. The acute marginal branch is small.  PCI Note: Following the diagnostic procedure, the decision was made to proceed with PCI. I felt the OM branch of the left circumflex was the likely culprit. His disease in the right coronary artery is diffuse in the vessel is small. I think it is best to leave her medical treatment. The distal AV circumflex also supplies a small area of myocardium and I would recommend medical treatment for this. The patient has been on long-term aspirin and Plavix.  Weight-based bivalirudin was given for anticoagulation. The procedure was performed through the 5 [place] guide catheter. A cougar coronary guidewire was used to cross the lesion. The lesion was predilated with a 2.5 mm balloon. The lesion was then stented with a 2.75x15 mm Xience Alpine drug-eluting stent. The stent was postdilated with a 3.0x12 mm noncompliant balloon to high pressure. Following PCI, there was 0% residual stenosis and TIMI-3 flow. Final angiography confirmed an excellent result. The patient tolerated the procedure well. There were no immediate procedural complications. A TR band was used for radial hemostasis. The patient was transferred to the post catheterization recovery area for further monitoring.  PCI Data:  Vessel - OM1/Segment - proximal  Percent Stenosis (pre)  0  TIMI-flow 3  Stent 2.75x15 mm Xience Alpine DES  Percent Stenosis (post) 0  TIMI-flow (post) 3  Final Conclusions:  1. moderate diffuse in-stent restenosis in the right coronary artery, unchanged from the previous study  2. Continued patency of the LAD stented segment  3. Severe stenosis of the first obtuse marginal treated successfully with PCI using a drug-eluting stent  Recommendations:  Check 2-D echocardiogram to assess LV function. Continue dual antiplatelet therapy with aspirin and Plavix. Aggressive risk reduction of tobacco cessation. Patient eligible for discharge tomorrow after his echo was done.   EKG: 11/17/2013 Sinus bradycardia with frequent PVCs Vent. rate 49 BPM PR interval 150 ms QRS duration 114 ms QT/QTc 486/439 ms P-R-T axes 44 50 12  Echo: 11/17/2013 Study Conclusions - Left ventricle: The cavity size was mildly dilated. Wall thickness was increased in a pattern of mild LVH. Systolic function was normal. The estimated ejection fraction was in the range of 50% to 55%. Wall motion was normal; there were no regional wall motion abnormalities. Doppler parameters are consistent with  abnormal left ventricular relaxation (grade 1 diastolic dysfunction). - Left atrium: The atrium was mildly dilated. - Right ventricle: The cavity size was moderately dilated. - Right atrium: The atrium was mildly to moderately dilated.    FOLLOW UP PLANS AND APPOINTMENTS Allergies  Allergen Reactions  . Penicillins Other (See Comments)    Unknown childhood reaction     Medication List         amLODipine 10 MG tablet  Commonly known as:  NORVASC  Take 1 tablet (10 mg total) by mouth daily.     aspirin 81 MG tablet  Take 81 mg by mouth daily.     carvedilol 12.5 MG tablet  Commonly known as:  COREG  Take 1 tablet (12.5 mg total) by mouth 2 (two) times daily with a meal.     cloNIDine 0.2 MG tablet  Commonly known as:  CATAPRES  Take 0.2 mg by mouth 2 (two) times daily.     clopidogrel 75 MG tablet  Commonly known as:  PLAVIX  Take 1 tablet (75 mg total) by mouth every morning.     escitalopram 10 MG tablet  Commonly known as:  LEXAPRO  Take 1 tablet (10 mg total) by mouth daily.     famotidine 40 MG tablet  Commonly known as:  PEPCID  Take 40 mg by mouth 2 (two) times daily.     furosemide 40 MG tablet  Commonly known as:  LASIX  Take 60 mg by mouth 2 (two) times daily.     losartan 100 MG tablet  Commonly known as:  COZAAR  Take 100 mg by mouth daily.     metFORMIN 500 MG tablet  Commonly known as:  GLUCOPHAGE  Take 1 tablet (500 mg total) by mouth daily with breakfast. HOLD for 48 hours, receiving on 06/11     mometasone-formoterol 100-5 MCG/ACT Aero  Commonly known as:  DULERA  Inhale 2 puffs into the lungs 2 (two) times daily.     nitroGLYCERIN 0.4 MG SL tablet  Commonly known as:  NITROSTAT  Place 1 tablet (0.4 mg total) under the tongue every 5 (five) minutes as needed for chest pain. For chest pain     potassium chloride SA 20 MEQ tablet  Commonly known as:  K-DUR,KLOR-CON  Take 1 tablet (20 mEq total) by mouth 2 (two) times daily.      pravastatin 80 MG tablet  Commonly  known as:  PRAVACHOL  Take 1 tablet (80 mg total) by mouth every evening.     Vitamin B 12 100 MCG Lozg  Take 200 mcg by mouth daily.        Discharge Instructions   Amb Referral to Cardiac Rehabilitation    Complete by:  As directed           Follow-up Information   Follow up with Sherren Mocha, MD On 12/08/2013. (At 8:30 am)    Specialty:  Cardiology   Contact information:   [address] [place] [address] [phone]       BRING ALL MEDICATIONS WITH YOU TO FOLLOW UP APPOINTMENTS  Time spent with patient to include physician time: 45 min Signed: Lonn Georgia, PA-C 11/17/2013, 10:45 AM Co-Sign MD

## 2013-11-17 NOTE — Care Management Note (Addendum)
  Page 1 of 1   11/18/2013     12:33:13 PM CARE MANAGEMENT NOTE 11/18/2013  Patient:  [name] [name], [name] [name]   Account Number:  [id number]  Date Initiated:  11/17/2013  Documentation initiated by:  Mariann Laster  Subjective/Objective Assessment:   Admitted with unstable angina     Action/Plan:   CM to follow for disposition needs   Anticipated DC Date:  11/17/2013   Anticipated DC Plan:  HOME/SELF CARE         Choice offered to / List presented to:             Status of service:  Completed, signed off Medicare Important Message given?   (If response is "NO", the following Medicare IM given date fields will be blank) Date Medicare IM given:   Date Additional Medicare IM given:    Discharge Disposition:  HOME/SELF CARE  Per UR Regulation:  Reviewed for med. necessity/level of care/duration of stay  If discussed at [place] of Stay Meetings, dates discussed:    Comments:  Ramandeep Arington RN, BSN, MSHL, CCM  Nurse - Case Manager,  ([facility] [place])  [phone]  11/18/2013 Med Review - Plavix D/c to home.

## 2013-11-18 NOTE — Discharge Summary (Signed)
Patient seen and examined and history reviewed. Agree with above findings and plan. See earlier rounding note.  [name] [name], [place] 11/18/2013 11:45 AM

## 2013-11-19 ENCOUNTER — Telehealth: Payer: Self-pay | Admitting: Cardiovascular Disease

## 2013-11-19 NOTE — Telephone Encounter (Signed)
I spoke with the pt and he did have a bottle of Carvedilol 25mg  tablets but he ran out this morning.  I advised the pt that he needs to pick up the new Rx that was sent to [facility] for Carvedilol 12.5mg  take one by mouth twice a day.  The pt verbalized understanding of medication instructions and will go to the pharmacy now.

## 2013-11-19 NOTE — Telephone Encounter (Signed)
New message     Please call pt and clarify his carvedilol dosage. He is confused about whether to cut the pill in half or not.

## 2013-11-26 ENCOUNTER — Other Ambulatory Visit: Payer: Self-pay | Admitting: Cardiovascular Disease

## 2013-12-01 ENCOUNTER — Encounter (HOSPITAL_COMMUNITY): Payer: Self-pay | Admitting: Emergency Medicine

## 2013-12-01 ENCOUNTER — Emergency Department (HOSPITAL_COMMUNITY)
Admission: EM | Admit: 2013-12-01 | Discharge: 2013-12-01 | Disposition: A | Discharge status: Home / Self Care | Payer: Medicare HMO | Attending: Emergency Medicine | Admitting: Emergency Medicine

## 2013-12-01 DIAGNOSIS — Z9861 Coronary angioplasty status: Secondary | ICD-10-CM | POA: Insufficient documentation

## 2013-12-01 DIAGNOSIS — E785 Hyperlipidemia, unspecified: Secondary | ICD-10-CM | POA: Insufficient documentation

## 2013-12-01 DIAGNOSIS — F329 Major depressive disorder, single episode, unspecified: Secondary | ICD-10-CM | POA: Insufficient documentation

## 2013-12-01 DIAGNOSIS — E119 Type 2 diabetes mellitus without complications: Secondary | ICD-10-CM | POA: Insufficient documentation

## 2013-12-01 DIAGNOSIS — Z7982 Long term (current) use of aspirin: Secondary | ICD-10-CM | POA: Insufficient documentation

## 2013-12-01 DIAGNOSIS — Z8701 Personal history of pneumonia (recurrent): Secondary | ICD-10-CM | POA: Insufficient documentation

## 2013-12-01 DIAGNOSIS — K649 Unspecified hemorrhoids: Secondary | ICD-10-CM | POA: Insufficient documentation

## 2013-12-01 DIAGNOSIS — F17200 Nicotine dependence, unspecified, uncomplicated: Secondary | ICD-10-CM | POA: Insufficient documentation

## 2013-12-01 DIAGNOSIS — K6289 Other specified diseases of anus and rectum: Secondary | ICD-10-CM | POA: Insufficient documentation

## 2013-12-01 DIAGNOSIS — I2510 Atherosclerotic heart disease of native coronary artery without angina pectoris: Secondary | ICD-10-CM | POA: Insufficient documentation

## 2013-12-01 DIAGNOSIS — E669 Obesity, unspecified: Secondary | ICD-10-CM | POA: Insufficient documentation

## 2013-12-01 DIAGNOSIS — F3289 Other specified depressive episodes: Secondary | ICD-10-CM | POA: Insufficient documentation

## 2013-12-01 DIAGNOSIS — Z79899 Other long term (current) drug therapy: Secondary | ICD-10-CM | POA: Insufficient documentation

## 2013-12-01 DIAGNOSIS — Z7902 Long term (current) use of antithrombotics/antiplatelets: Secondary | ICD-10-CM | POA: Insufficient documentation

## 2013-12-01 DIAGNOSIS — I10 Essential (primary) hypertension: Secondary | ICD-10-CM | POA: Insufficient documentation

## 2013-12-01 DIAGNOSIS — N489 Disorder of penis, unspecified: Secondary | ICD-10-CM | POA: Insufficient documentation

## 2013-12-01 DIAGNOSIS — K5900 Constipation, unspecified: Secondary | ICD-10-CM | POA: Insufficient documentation

## 2013-12-01 DIAGNOSIS — Z880 Allergy status to penicillin: Secondary | ICD-10-CM | POA: Insufficient documentation

## 2013-12-01 LAB — URINALYSIS, ROUTINE W REFLEX MICROSCOPIC
BILIRUBIN URINE: NEGATIVE
Bilirubin Urine: NEGATIVE
Glucose, UA: NEGATIVE mg/dL
Glucose, UA: NEGATIVE mg/dL
HGB URINE DIPSTICK: NEGATIVE
Hgb urine dipstick: NEGATIVE
Ketones, ur: NEGATIVE mg/dL
Ketones, ur: NEGATIVE mg/dL
Leukocytes, UA: NEGATIVE
Leukocytes, UA: NEGATIVE
NITRITE: NEGATIVE
NITRITE: NEGATIVE
PH: 7 (ref 5.0–8.0)
Protein, ur: 100 mg/dL — AB
Protein, ur: 100 mg/dL — AB
SPECIFIC GRAVITY, URINE: 1.021 (ref 1.005–1.030)
Specific Gravity, Urine: 1.018 (ref 1.005–1.030)
UROBILINOGEN UA: 1 mg/dL (ref 0.0–1.0)
Urobilinogen, UA: 1 mg/dL (ref 0.0–1.0)
pH: 5.5 (ref 5.0–8.0)

## 2013-12-01 LAB — URINE MICROSCOPIC-ADD ON

## 2013-12-01 LAB — COMPREHENSIVE METABOLIC PANEL
ALT: 24 U/L (ref 0–53)
AST: 22 U/L (ref 0–37)
Albumin: 4.2 g/dL (ref 3.5–5.2)
Alkaline Phosphatase: 86 U/L (ref 39–117)
BUN: 16 mg/dL (ref 6–23)
CALCIUM: 10.4 mg/dL (ref 8.4–10.5)
CO2: 23 mEq/L (ref 19–32)
CREATININE: 1.11 mg/dL (ref 0.50–1.35)
Chloride: 101 mEq/L (ref 96–112)
GFR, EST AFRICAN AMERICAN: 84 mL/min — AB (ref >90)
GFR, EST NON AFRICAN AMERICAN: 72 mL/min — AB (ref >90)
Glucose, Bld: 90 mg/dL (ref 70–99)
Potassium: 3.7 mEq/L (ref 3.7–5.3)
Sodium: 141 mEq/L (ref 137–147)
TOTAL PROTEIN: 7.7 g/dL (ref 6.0–8.3)
Total Bilirubin: 0.3 mg/dL (ref 0.3–1.2)

## 2013-12-01 LAB — CBC
HCT: 45.3 % (ref 39.0–52.0)
Hemoglobin: 15.8 g/dL (ref 13.0–17.0)
MCH: 29.4 pg (ref 26.0–34.0)
MCHC: 34.9 g/dL (ref 30.0–36.0)
MCV: 84.4 fL (ref 78.0–100.0)
Platelets: 250 10*3/uL (ref 150–400)
RBC: 5.37 MIL/uL (ref 4.22–5.81)
RDW: 15.1 % (ref 11.5–15.5)
WBC: 11.1 10*3/uL — ABNORMAL HIGH (ref 4.0–10.5)

## 2013-12-01 MED ORDER — ONDANSETRON HCL 4 MG/2ML IJ SOLN
4.0000 mg | Freq: Once | INTRAMUSCULAR | Status: AC
  Administered 2013-12-01: 4 mg via INTRAVENOUS

## 2013-12-01 MED ORDER — ONDANSETRON HCL 4 MG/2ML IJ SOLN
INTRAMUSCULAR | Status: AC
  Filled 2013-12-01: qty 2

## 2013-12-01 MED ORDER — HYDROMORPHONE HCL PF 1 MG/ML IJ SOLN
1.0000 mg | Freq: Once | INTRAMUSCULAR | Status: AC
  Administered 2013-12-01: 1 mg via INTRAVENOUS

## 2013-12-01 MED ORDER — SODIUM CHLORIDE 0.9 % IV BOLUS (SEPSIS)
500.0000 mL | Freq: Once | INTRAVENOUS | Status: AC
  Administered 2013-12-01: 500 mL via INTRAVENOUS

## 2013-12-01 MED ORDER — HYDROMORPHONE HCL PF 1 MG/ML IJ SOLN
INTRAMUSCULAR | Status: AC
  Filled 2013-12-01: qty 1

## 2013-12-01 MED ORDER — HYDROCORTISONE ACETATE 25 MG RE SUPP
25.0000 mg | Freq: Once | RECTAL | Status: AC
  Administered 2013-12-01: 25 mg via RECTAL
  Filled 2013-12-01: qty 1

## 2013-12-01 MED ORDER — HYDROCORTISONE 2.5 % RE CREA: TOPICAL_CREAM | RECTAL | Status: DC

## 2013-12-01 NOTE — ED Notes (Signed)
Pt d/c recently from [facility] for heart attack.  Pt had hard bm on Monday. States soon after noted blood in stool.  Pt here today with pain in rectum and tip of penis.  Also states still having light blood at rectum. Difficulty urinating.

## 2013-12-01 NOTE — ED Provider Notes (Signed)
CSN: [id number]     Arrival date & time 12/01/13  1501 History   First MD Initiated Contact with Patient 12/01/13 1627     Chief Complaint  Patient presents with  . Rectal Pain  . Penis Pain     (Consider location/radiation/quality/duration/timing/severity/associated sxs/prior Treatment) The history is provided by the patient.  pt c/o rectal pain. States was in hospital approximately 1 week ago w nstemi.  States since returning home, stools were hard/straining, and then noted rectal pain post hard bm. No rectal trauma. Pain moderate, constant, worse w bm. Denies hx same. No hx hemorrhoids. Has noted small amt bright red blood per rectum. No melena. Stools normal color. Denies abd pain. States rectal pain is so bad at times it almost feels as if pain is around in area penis.  Denies actual penis pain, swelling or tenderness. Denies dysuria. No fever or chills. Denies hx prostatitis. No anterior/abd or pelvic pain.     Past Medical History  Diagnosis Date  . Dyslipidemia   . HTN (hypertension)   . CAD (coronary artery disease)     a. s/p PCI/BMS pRCA 2002; b. PCI pRCA & DES p/m RCA 2006; c. PCI/DES OM4 2007; d. PCI/CBA to RCA for ISR 10/2006; e.08/2012 STEMI/Cath/PCI: LM nl, LAD95-5m (3.0x20 Promus Prem DES), 95/95 ap LAD (1.0-1.67mm), LCX 30p 95d (sm), LPL1 70-80p, patent stent, LPL2 nl, RCA 30p, 40/70 ISR, EF 35-40%.  . Ischemic cardiomyopathy     a. EF 40%; improved to normal;  b. 08/2012 Echo: EF 50-55%, mod LVH.  [name] GERD (gastroesophageal reflux disease)   . Tobacco abuse   . Obesity   . OSA (obstructive sleep apnea)     Does not use CPAP as of 05/2011  . Depression   . History of pneumonia   . Diabetes mellitus     a. A1c 8.8 08/2012->Metformin initiated. => b. A1c (9/14): 6.6  . Hyperlipidemia    Past Surgical History  Procedure Laterality Date  . Coronary stent placement  2009  . Coronary angioplasty with stent placement     Family History  Problem Relation Age of Onset  .  Coronary artery disease    . Depression Mother   . Cancer Mother     ovarian  . Hypertension Mother     Died, 61  . Other Father     motor vehicle accident   History  Substance Use Topics  . Smoking status: Current Every Day Smoker -- 0.25 packs/day for 30 years    Types: Cigarettes  . Smokeless tobacco: Not on file     Comment: trying to cut down  . Alcohol Use: No    Review of Systems  Constitutional: Negative for fever and chills.  HENT: Negative for sore throat.   Eyes: Negative for redness.  Respiratory: Negative for shortness of breath.   Cardiovascular: Negative for chest pain.  Gastrointestinal: Positive for constipation. Negative for vomiting, abdominal pain and diarrhea.  Genitourinary: Negative for flank pain.  Musculoskeletal: Negative for back pain and neck pain.  Skin: Negative for rash.  Neurological: Negative for headaches.  Hematological: Does not bruise/bleed easily.  Psychiatric/Behavioral: Negative for confusion.      Allergies  Penicillins  Home Medications   Prior to Admission medications   Medication Sig Start Date End Date Taking? Authorizing Provider  amLODipine (NORVASC) 10 MG tablet Take 1 tablet (10 mg total) by mouth daily. 06/26/13  Yes Biagio Borg, MD  aspirin 81 MG tablet Take 81 mg  by mouth daily.   Yes Historical Provider, MD  carvedilol (COREG) 12.5 MG tablet Take 1 tablet (12.5 mg total) by mouth 2 (two) times daily with a meal. 11/17/13  Yes Rhonda G Barrett, PA-C  cloNIDine (CATAPRES) 0.2 MG tablet Take 0.2 mg by mouth 2 (two) times daily.   Yes Historical Provider, MD  clopidogrel (PLAVIX) 75 MG tablet Take 1 tablet (75 mg total) by mouth every morning. 11/17/13  Yes Rhonda G Barrett, PA-C  Cyanocobalamin (VITAMIN B 12) 100 MCG LOZG Take 200 mcg by mouth daily.   Yes Historical Provider, MD  escitalopram (LEXAPRO) 10 MG tablet Take 1 tablet (10 mg total) by mouth daily. 10/13/13 01/11/14 Yes Biagio Borg, MD  famotidine (PEPCID) 40 MG  tablet Take 40 mg by mouth 2 (two) times daily.   Yes Historical Provider, MD  furosemide (LASIX) 40 MG tablet Take 60 mg by mouth 2 (two) times daily.   Yes Historical Provider, MD  losartan (COZAAR) 100 MG tablet TAKE 1 TABLET BY MOUTH EVERY DAY 11/26/13  Yes Sherren Mocha, MD  metFORMIN (GLUCOPHAGE) 500 MG tablet Take 1 tablet (500 mg total) by mouth daily with breakfast. HOLD for 48 hours, receiving on 06/11 11/17/13  Yes Rhonda G Barrett, PA-C  mometasone-formoterol (DULERA) 100-5 MCG/ACT AERO Inhale 2 puffs into the lungs 2 (two) times daily. 10/13/13  Yes Biagio Borg, MD  potassium chloride SA (K-DUR,KLOR-CON) 20 MEQ tablet Take 1 tablet (20 mEq total) by mouth 2 (two) times daily. 02/26/13  Yes Scott Joylene Draft, PA-C  pravastatin (PRAVACHOL) 80 MG tablet Take 1 tablet (80 mg total) by mouth every evening. 06/26/13  Yes Biagio Borg, MD  nitroGLYCERIN (NITROSTAT) 0.4 MG SL tablet Place 1 tablet (0.4 mg total) under the tongue every 5 (five) minutes as needed for chest pain. For chest pain 11/17/13   Evelene Croon Barrett, PA-C   BP 210/118  Pulse 74  Temp(Src) 97.5 F (36.4 C) (Oral)  Resp 24  SpO2 100% Physical Exam  Nursing note and vitals reviewed. Constitutional: He is oriented to person, place, and time. He appears well-developed and well-nourished. No distress.  HENT:  Mouth/Throat: Oropharynx is clear and moist.  Eyes: Conjunctivae are normal.  Neck: Neck supple. No tracheal deviation present.  Cardiovascular: Normal rate, regular rhythm, normal heart sounds and intact distal pulses.   Pulmonary/Chest: Effort normal and breath sounds normal. No accessory muscle usage. No respiratory distress.  Abdominal: Soft. Bowel sounds are normal. He exhibits no distension and no mass. There is no tenderness. There is no rebound and no guarding.  Genitourinary:  Normal ext gen - no scrotal or testicular pain, swelling or tenderness. No discharge. No cva tenderness. Rectal 1 cm, infamed hemorrhoid w dark  blood on surface. No active bleeding. Stool above is light yellowish brown-  Heme neg.  No abscess.   Musculoskeletal: Normal range of motion.  Neurological: He is alert and oriented to person, place, and time.  Skin: Skin is warm and dry. He is not diaphoretic.  Psychiatric: He has a normal mood and affect.    ED Course  Procedures (including critical care time) Labs Review  Results for orders placed during the hospital encounter of 12/01/13  URINALYSIS, ROUTINE W REFLEX MICROSCOPIC      Result Value Ref Range   Color, Urine YELLOW  YELLOW   APPearance CLEAR  CLEAR   Specific Gravity, Urine 1.021  1.005 - 1.030   pH 5.5  5.0 - 8.0  Glucose, UA NEGATIVE  NEGATIVE mg/dL   Hgb urine dipstick NEGATIVE  NEGATIVE   Bilirubin Urine NEGATIVE  NEGATIVE   Ketones, ur NEGATIVE  NEGATIVE mg/dL   Protein, ur 100 (*) NEGATIVE mg/dL   Urobilinogen, UA 1.0  0.0 - 1.0 mg/dL   Nitrite NEGATIVE  NEGATIVE   Leukocytes, UA NEGATIVE  NEGATIVE  CBC      Result Value Ref Range   WBC 11.1 (*) 4.0 - 10.5 K/uL   RBC 5.37  4.22 - 5.81 MIL/uL   Hemoglobin 15.8  13.0 - 17.0 g/dL   HCT 45.3  39.0 - 52.0 %   MCV 84.4  78.0 - 100.0 fL   MCH 29.4  26.0 - 34.0 pg   MCHC 34.9  30.0 - 36.0 g/dL   RDW 15.1  11.5 - 15.5 %   Platelets 250  150 - 400 K/uL  COMPREHENSIVE METABOLIC PANEL      Result Value Ref Range   Sodium 141  137 - 147 mEq/L   Potassium 3.7  3.7 - 5.3 mEq/L   Chloride 101  96 - 112 mEq/L   CO2 23  19 - 32 mEq/L   Glucose, Bld 90  70 - 99 mg/dL   BUN 16  6 - 23 mg/dL   Creatinine, Ser 1.11  0.50 - 1.35 mg/dL   Calcium 10.4  8.4 - 10.5 mg/dL   Total Protein 7.7  6.0 - 8.3 g/dL   Albumin 4.2  3.5 - 5.2 g/dL   AST 22  0 - 37 U/L   ALT 24  0 - 53 U/L   Alkaline Phosphatase 86  39 - 117 U/L   Total Bilirubin 0.3  0.3 - 1.2 mg/dL   GFR calc non Af Amer 72 (*) >90 mL/min   GFR calc Af Amer 84 (*) >90 mL/min  URINALYSIS, ROUTINE W REFLEX MICROSCOPIC      Result Value Ref Range   Color,  Urine YELLOW  YELLOW   APPearance CLEAR  CLEAR   Specific Gravity, Urine 1.018  1.005 - 1.030   pH 7.0  5.0 - 8.0   Glucose, UA NEGATIVE  NEGATIVE mg/dL   Hgb urine dipstick NEGATIVE  NEGATIVE   Bilirubin Urine NEGATIVE  NEGATIVE   Ketones, ur NEGATIVE  NEGATIVE mg/dL   Protein, ur 100 (*) NEGATIVE mg/dL   Urobilinogen, UA 1.0  0.0 - 1.0 mg/dL   Nitrite NEGATIVE  NEGATIVE   Leukocytes, UA NEGATIVE  NEGATIVE  URINE MICROSCOPIC-ADD ON      Result Value Ref Range   WBC, UA 0-2  <3 WBC/hpf   RBC / HPF 0-2  <3 RBC/hpf   Bacteria, UA RARE  RARE  URINE MICROSCOPIC-ADD ON      Result Value Ref Range   Squamous Epithelial / LPF RARE  RARE   WBC, UA 0-2  <3 WBC/hpf   RBC / HPF 0-2  <3 RBC/hpf   Bacteria, UA RARE  RARE   Dg Chest 2 View  11/13/2013   CLINICAL DATA:  Shortness of breath and chest pain  EXAM: CHEST  2 VIEW  COMPARISON:  September 28, 2013  FINDINGS: There is mild scarring in the right base. The interstitium in the lower lobes is mildly prominent but stable. There is no frank edema or consolidation. Heart size is upper normal with normal pulmonary vascularity. No adenopathy. No bone lesions.  IMPRESSION: Scarring in the right base. Probable chronic inflammatory change in both lower lobes, stable. No edema  or consolidation. The cardiac silhouette is stable.   Electronically Signed   By: Lowella Grip M.D.   On: 11/13/2013 21:15      MDM  Iv ns. Labs. Ua.  Dilaudid 1 mg iv. zofran iv.  Reviewed nursing notes and prior charts for additional history.   anusol suppository.   Recheck pt feels much improved. No pain. abd soft nt, no fevers.      Mirna Mires, MD 12/01/13 [phone]

## 2013-12-01 NOTE — Discharge Instructions (Signed)
Use anusol cream as need for symptom relief. If constipated, take colace (stool softener) and miralax (laxative) - both available over the counter. Follow up with your doctor in the next few days if symptoms fail to improve/resolve.  Also follow up with your doctor for recheck of blood pressure in coming week as it was high today. For hemorrhoids, follow up with general surgeon in the next 1-2 weeks - see referral - call for appointment. Return to ER right away if worse, new symptoms, fevers, vomiting, abdominal pain, other concern.    Hemorrhoids Hemorrhoids are swollen veins around the rectum or anus. There are two types of hemorrhoids:   Internal hemorrhoids. These occur in the veins just inside the rectum. They may poke through to the outside and become irritated and painful.  External hemorrhoids. These occur in the veins outside the anus and can be felt as a painful swelling or hard lump near the anus. CAUSES  Pregnancy.   Obesity.   Constipation or diarrhea.   Straining to have a bowel movement.   Sitting for long periods on the toilet.  Heavy lifting or other activity that caused you to strain.  Anal intercourse. SYMPTOMS   Pain.   Anal itching or irritation.   Rectal bleeding.   Fecal leakage.   Anal swelling.   One or more lumps around the anus.  DIAGNOSIS  Your caregiver may be able to diagnose hemorrhoids by visual examination. Other examinations or tests that may be performed include:   Examination of the rectal area with a gloved hand (digital rectal exam).   Examination of anal canal using a small tube (scope).   A blood test if you have lost a significant amount of blood.  A test to look inside the colon (sigmoidoscopy or colonoscopy). TREATMENT Most hemorrhoids can be treated at home. However, if symptoms do not seem to be getting better or if you have a lot of rectal bleeding, your caregiver may perform a procedure to help make the  hemorrhoids get smaller or remove them completely. Possible treatments include:   Placing a rubber band at the base of the hemorrhoid to cut off the circulation (rubber band ligation).   Injecting a chemical to shrink the hemorrhoid (sclerotherapy).   Using a tool to burn the hemorrhoid (infrared light therapy).   Surgically removing the hemorrhoid (hemorrhoidectomy).   Stapling the hemorrhoid to block blood flow to the tissue (hemorrhoid stapling).  HOME CARE INSTRUCTIONS   Eat foods with fiber, such as whole grains, beans, nuts, fruits, and vegetables. Ask your doctor about taking products with added fiber in them (fibersupplements).  Increase fluid intake. Drink enough water and fluids to keep your urine clear or pale yellow.   Exercise regularly.   Go to the bathroom when you have the urge to have a bowel movement. Do not wait.   Avoid straining to have bowel movements.   Keep the anal area dry and clean. Use wet toilet paper or moist towelettes after a bowel movement.   Medicated creams and suppositories may be used or applied as directed.   Only take over-the-counter or prescription medicines as directed by your caregiver.   Take warm sitz baths for 15-20 minutes, 3-4 times a day to ease pain and discomfort.   Place ice packs on the hemorrhoids if they are tender and swollen. Using ice packs between sitz baths may be helpful.   Put ice in a plastic bag.   Place a towel between your skin  and the bag.   Leave the ice on for 15-20 minutes, 3-4 times a day.   Do not use a donut-shaped pillow or sit on the toilet for long periods. This increases blood pooling and pain.  SEEK MEDICAL CARE IF:  You have increasing pain and swelling that is not controlled by treatment or medicine.  You have uncontrolled bleeding.  You have difficulty or you are unable to have a bowel movement.  You have pain or inflammation outside the area of the hemorrhoids. MAKE  SURE YOU:  Understand these instructions.  Will watch your condition.  Will get help right away if you are not doing well or get worse. Document Released: 05/25/2000 Document Revised: 05/14/2012 Document Reviewed: 04/01/2012 [facility] Patient Information 2015 [place], [place]. This information is not intended to replace advice given to you by your health care provider. Make sure you discuss any questions you have with your health care provider.   Constipation Constipation is when a person has fewer than three bowel movements a week, has difficulty having a bowel movement, or has stools that are dry, hard, or larger than normal. As people grow older, constipation is more common. If you try to fix constipation with medicines that make you have a bowel movement (laxatives), the problem may get worse. Long-term laxative use may cause the muscles of the colon to become weak. A low-fiber diet, not taking in enough fluids, and taking certain medicines may make constipation worse.  CAUSES   Certain medicines, such as antidepressants, pain medicine, iron supplements, antacids, and water pills.   Certain diseases, such as diabetes, irritable bowel syndrome (IBS), thyroid disease, or depression.   Not drinking enough water.   Not eating enough fiber-rich foods.   Stress or travel.   Lack of physical activity or exercise.   Ignoring the urge to have a bowel movement.   Using laxatives too much.  SIGNS AND SYMPTOMS   Having fewer than three bowel movements a week.   Straining to have a bowel movement.   Having stools that are hard, dry, or larger than normal.   Feeling full or bloated.   Pain in the lower abdomen.   Not feeling relief after having a bowel movement.  DIAGNOSIS  Your health care provider will take a medical history and perform a physical exam. Further testing may be done for severe constipation. Some tests may include:  A barium enema X-ray to examine your  rectum, colon, and, sometimes, your small intestine.   A sigmoidoscopy to examine your lower colon.   A colonoscopy to examine your entire colon. TREATMENT  Treatment will depend on the severity of your constipation and what is causing it. Some dietary treatments include drinking more fluids and eating more fiber-rich foods. Lifestyle treatments may include regular exercise. If these diet and lifestyle recommendations do not help, your health care provider may recommend taking over-the-counter laxative medicines to help you have bowel movements. Prescription medicines may be prescribed if over-the-counter medicines do not work.  HOME CARE INSTRUCTIONS   Eat foods that have a lot of fiber, such as fruits, vegetables, whole grains, and beans.  Limit foods high in fat and processed sugars, such as french fries, hamburgers, cookies, candies, and soda.   A fiber supplement may be added to your diet if you cannot get enough fiber from foods.   Drink enough fluids to keep your urine clear or pale yellow.   Exercise regularly or as directed by your health care provider.  Go to the restroom when you have the urge to go. Do not hold it.   Only take over-the-counter or prescription medicines as directed by your health care provider. Do not take other medicines for constipation without talking to your health care provider first.  [place] IF:   You have bright red blood in your stool.   Your constipation lasts for more than 4 days or gets worse.   You have abdominal or rectal pain.   You have thin, pencil-like stools.   You have unexplained weight loss. MAKE SURE YOU:   Understand these instructions.  Will watch your condition.  Will get help right away if you are not doing well or get worse. Document Released: 02/24/2004 Document Revised: 06/02/2013 Document Reviewed: 03/09/2013 [facility] Patient Information 2015 [place], [place]. This information is not  intended to replace advice given to you by your health care provider. Make sure you discuss any questions you have with your health care provider.

## 2013-12-02 ENCOUNTER — Ambulatory Visit (INDEPENDENT_AMBULATORY_CARE_PROVIDER_SITE_OTHER): Payer: Commercial Managed Care - HMO | Admitting: Pulmonary Disease

## 2013-12-02 ENCOUNTER — Encounter: Payer: Self-pay | Admitting: Pulmonary Disease

## 2013-12-02 ENCOUNTER — Ambulatory Visit (HOSPITAL_COMMUNITY)
Admission: RE | Admit: 2013-12-02 | Discharge: 2013-12-02 | Disposition: A | Discharge status: Home / Self Care | Payer: Medicare HMO | Source: Ambulatory Visit | Attending: Pulmonary Disease | Admitting: Pulmonary Disease

## 2013-12-02 VITALS — BP 126/80 | HR 50 | Temp 98.2°F | Ht 73.0 in | Wt 330.0 lb

## 2013-12-02 DIAGNOSIS — R0989 Other specified symptoms and signs involving the circulatory and respiratory systems: Secondary | ICD-10-CM

## 2013-12-02 DIAGNOSIS — R0609 Other forms of dyspnea: Secondary | ICD-10-CM

## 2013-12-02 DIAGNOSIS — F17200 Nicotine dependence, unspecified, uncomplicated: Secondary | ICD-10-CM | POA: Insufficient documentation

## 2013-12-02 DIAGNOSIS — R0602 Shortness of breath: Secondary | ICD-10-CM | POA: Insufficient documentation

## 2013-12-02 MED ORDER — ALBUTEROL SULFATE (2.5 MG/3ML) 0.083% IN NEBU
2.5000 mg | INHALATION_SOLUTION | Freq: Once | RESPIRATORY_TRACT | Status: AC
  Administered 2013-12-02: 2.5 mg via RESPIRATORY_TRACT

## 2013-12-02 NOTE — Assessment & Plan Note (Signed)
The patient has essentially normal pulmonary function studies, and no significant process on his chest x-ray. I can find no significant lung disease to explain his dyspnea on exertion, and this is probably related to his morbid obesity with deconditioning as well as his cardiomyopathy. I was very surprised he did not have obstructive lung disease with his extensive smoking history, but I stressed to him the importance of total smoking cessation. I did ask him to discontinue his inhaled bronchodilators because of the results of his PFTs.

## 2013-12-02 NOTE — Patient Instructions (Signed)
You do not have a lung problem by your breathing tests and xray.  Good news.  However, you must stop smoking to keep it that way. Work on weight loss and conditioning Discontinue all of your inhaled medication. followup with me as needed.

## 2013-12-02 NOTE — Progress Notes (Signed)
   Subjective:    Patient ID: [name] [name], male    DOB: [birth year], 57 y.o.   MRN: [id number]  HPI Patient comes in today for followup of his recent PFTs, as part of a workup for dyspnea on exertion. Surprisingly, he was found to have no airflow obstruction, no restriction, and a normal diffusion capacity. He did have very mild air-trapping noted. His chest x-ray showed no significant abnormalities. I have reviewed the study with him in great detail, and answered all of his questions.   Review of Systems  Constitutional: Negative for fever and unexpected weight change.  HENT: Negative for congestion, dental problem, ear pain, nosebleeds, postnasal drip, rhinorrhea, sinus pressure, sneezing, sore throat and trouble swallowing.   Eyes: Negative for redness and itching.  Respiratory: Positive for shortness of breath. Negative for cough, chest tightness and wheezing.   Cardiovascular: Negative for palpitations and leg swelling.  Gastrointestinal: Negative for nausea and vomiting.  Genitourinary: Negative for dysuria.  Musculoskeletal: Negative for joint swelling.  Skin: Negative for rash.  Neurological: Negative for headaches.  Hematological: Does not bruise/bleed easily.  Psychiatric/Behavioral: Negative for dysphoric mood. The patient is not nervous/anxious.        Objective:   Physical Exam Obese male in no acute distress Nose without purulence or discharge noted Neck without lymphadenopathy or thyromegaly Lower extremities with edema noted, no cyanosis Alert and oriented, moves all 4 extremities.       Assessment & Plan:

## 2013-12-03 ENCOUNTER — Encounter: Payer: Self-pay | Admitting: Internal Medicine

## 2013-12-03 ENCOUNTER — Ambulatory Visit (INDEPENDENT_AMBULATORY_CARE_PROVIDER_SITE_OTHER): Payer: Commercial Managed Care - HMO | Admitting: Internal Medicine

## 2013-12-03 VITALS — BP 132/80 | HR 62 | Temp 98.6°F | Ht 73.0 in | Wt 329.5 lb

## 2013-12-03 DIAGNOSIS — E0900 Drug or chemical induced diabetes mellitus with hyperosmolarity without nonketotic hyperglycemic-hyperosmolar coma (NKHHC): Secondary | ICD-10-CM

## 2013-12-03 DIAGNOSIS — K645 Perianal venous thrombosis: Secondary | ICD-10-CM

## 2013-12-03 DIAGNOSIS — I10 Essential (primary) hypertension: Secondary | ICD-10-CM

## 2013-12-03 DIAGNOSIS — E1300 Other specified diabetes mellitus with hyperosmolarity without nonketotic hyperglycemic-hyperosmolar coma (NKHHC): Secondary | ICD-10-CM

## 2013-12-03 LAB — PULMONARY FUNCTION TEST
DL/VA % PRED: 95 %
DL/VA: 4.58 ml/min/mmHg/L
DLCO COR % PRED: 77 %
DLCO UNC % PRED: 80 %
DLCO cor: 28.34 ml/min/mmHg
DLCO unc: 29.25 ml/min/mmHg
FEF 25-75 POST: 3.43 L/s
FEF 25-75 PRE: 3.06 L/s
FEF2575-%CHANGE-POST: 12 %
FEF2575-%Pred-Post: 102 %
FEF2575-%Pred-Pre: 91 %
FEV1-%Change-Post: 2 %
FEV1-%PRED-PRE: 88 %
FEV1-%Pred-Post: 90 %
FEV1-PRE: 3.17 L
FEV1-Post: 3.24 L
FEV1FVC-%Change-Post: 1 %
FEV1FVC-%PRED-PRE: 99 %
FEV6-%CHANGE-POST: 0 %
FEV6-%Pred-Post: 90 %
FEV6-%Pred-Pre: 91 %
FEV6-POST: 3.99 L
FEV6-Pre: 4.01 L
FEV6FVC-%Change-Post: 0 %
FEV6FVC-%PRED-PRE: 102 %
FEV6FVC-%Pred-Post: 101 %
FVC-%Change-Post: 0 %
FVC-%PRED-POST: 88 %
FVC-%Pred-Pre: 88 %
FVC-Post: 4.04 L
FVC-Pre: 4.03 L
POST FEV1/FVC RATIO: 80 %
PRE FEV1/FVC RATIO: 79 %
Post FEV6/FVC ratio: 99 %
Pre FEV6/FVC Ratio: 100 %
RV % pred: 110 %
RV: 2.58 L
TLC % pred: 87 %
TLC: 6.61 L

## 2013-12-03 MED ORDER — LIDOCAINE-HYDROCORTISONE ACE 3-0.5 % RE CREA: 1.0000 | TOPICAL_CREAM | Freq: Two times a day (BID) | RECTAL | Status: DC

## 2013-12-03 NOTE — Patient Instructions (Signed)
Please take all new medication as prescribed - the anamantle cream  Please continue all other medications as before, and refills have been done if requested.  Please have the pharmacy call with any other refills you may need.  Please keep your appointments with your specialists as you may have planned  Please call if not improving, for referral to general surgury

## 2013-12-03 NOTE — Assessment & Plan Note (Signed)
stable overall by history and exam, recent data reviewed with pt, and pt to continue medical treatment as before,  to f/u any worsening symptoms or concerns Lab Results  Component Value Date   HGBA1C 6.6* 06/29/2013   Declines further lab at this time

## 2013-12-03 NOTE — Assessment & Plan Note (Signed)
stable overall by history and exam, recent data reviewed with pt, and pt to continue medical treatment as before,  to f/u any worsening symptoms or concerns BP Readings from Last 3 Encounters:  12/03/13 132/80  12/02/13 126/80  12/01/13 171/91

## 2013-12-03 NOTE — Progress Notes (Signed)
Subjective:    Patient ID: [name] [name], male    DOB: Apr 03, 1956, 58 y.o.   MRN: [id number]  HPI  Here to f/u, after recent UC visit with finding ext thrombosed hemorrhoid, pt declined gen surg referral, pain somewhat improved only with anusol HC, small BRB noted but none today, Denies worsening reflux, abd pain, dysphagia, n/v, bowel change. Afraid of next BM due to pain, has been purposefully avoiding.  Pt denies chest pain, increased sob or doe, wheezing, orthopnea, PND, increased LE swelling, palpitations, dizziness or syncope.   Pt denies polydipsia, polyuria,   Past Medical History  Diagnosis Date  . Dyslipidemia   . HTN (hypertension)   . CAD (coronary artery disease)     a. s/p PCI/BMS pRCA 2002; b. PCI pRCA & DES p/m RCA 2006; c. PCI/DES OM4 2007; d. PCI/CBA to RCA for ISR 10/2006; e.08/2012 STEMI/Cath/PCI: LM nl, LAD95-29m (3.0x20 Promus Prem DES), 95/95 ap LAD (1.0-1.36mm), LCX 30p 95d (sm), LPL1 70-80p, patent stent, LPL2 nl, RCA 30p, 40/70 ISR, EF 35-40%.  . Ischemic cardiomyopathy     a. EF 40%; improved to normal;  b. 08/2012 Echo: EF 50-55%, mod LVH.  [name] GERD (gastroesophageal reflux disease)   . Tobacco abuse   . Obesity   . OSA (obstructive sleep apnea)     Does not use CPAP as of 05/2011  . Depression   . History of pneumonia   . Diabetes mellitus     a. A1c 8.8 08/2012->Metformin initiated. => b. A1c (9/14): 6.6  . Hyperlipidemia    Past Surgical History  Procedure Laterality Date  . Coronary stent placement  2009  . Coronary angioplasty with stent placement      reports that he has been smoking Cigarettes.  He has a 7.5 pack-year smoking history. He does not have any smokeless tobacco history on file. He reports that he does not drink alcohol or use illicit drugs. family history includes Cancer in his mother; Coronary artery disease in an other family member; Depression in his mother; Hypertension in his mother; Other in his father. Allergies  Allergen Reactions  .  Penicillins Other (See Comments)    Unknown childhood reaction   Current Outpatient Prescriptions on File Prior to Visit  Medication Sig Dispense Refill  . amLODipine (NORVASC) 10 MG tablet Take 1 tablet (10 mg total) by mouth daily.  90 tablet  3  . aspirin 81 MG tablet Take 81 mg by mouth daily.      . carvedilol (COREG) 12.5 MG tablet Take 1 tablet (12.5 mg total) by mouth 2 (two) times daily with a meal.  60 tablet  11  . cloNIDine (CATAPRES) 0.2 MG tablet Take 0.2 mg by mouth 2 (two) times daily.      . clopidogrel (PLAVIX) 75 MG tablet Take 1 tablet (75 mg total) by mouth every morning.  90 tablet  3  . Cyanocobalamin (VITAMIN B 12) 100 MCG LOZG Take 200 mcg by mouth daily.      [name] escitalopram (LEXAPRO) 10 MG tablet Take 1 tablet (10 mg total) by mouth daily.  90 tablet  3  . famotidine (PEPCID) 40 MG tablet Take 40 mg by mouth 2 (two) times daily.      . furosemide (LASIX) 40 MG tablet Take 60 mg by mouth 2 (two) times daily.      . hydrocortisone (ANUSOL-HC) 2.5 % rectal cream Apply rectally 2 times daily  30 g  0  . losartan (COZAAR) 100 MG  tablet TAKE 1 TABLET BY MOUTH EVERY DAY  90 tablet  0  . metFORMIN (GLUCOPHAGE) 500 MG tablet Take 1 tablet (500 mg total) by mouth daily with breakfast. HOLD for 48 hours, receiving on 06/11      . mometasone-formoterol (DULERA) 100-5 MCG/ACT AERO Inhale 2 puffs into the lungs 2 (two) times daily.  13 g  11  . nitroGLYCERIN (NITROSTAT) 0.4 MG SL tablet Place 1 tablet (0.4 mg total) under the tongue every 5 (five) minutes as needed for chest pain. For chest pain  25 tablet  12  . potassium chloride SA (K-DUR,KLOR-CON) 20 MEQ tablet Take 1 tablet (20 mEq total) by mouth 2 (two) times daily.  60 tablet  6  . pravastatin (PRAVACHOL) 80 MG tablet Take 1 tablet (80 mg total) by mouth every evening.  90 tablet  3   No current facility-administered medications on file prior to visit.   Review of Systems  Constitutional: Negative for unusual diaphoresis  or other sweats  HENT: Negative for ringing in ear Eyes: Negative for double vision or worsening visual disturbance.  Respiratory: Negative for choking and stridor.   Gastrointestinal: Negative for vomiting or other signifcant bowel change Genitourinary: Negative for hematuria or decreased urine volume.  Musculoskeletal: Negative for other MSK pain or swelling Skin: Negative for color change and worsening wound.  Neurological: Negative for tremors and numbness other than noted  Psychiatric/Behavioral: Negative for decreased concentration or agitation other than above       Objective:   Physical Exam BP 132/80  Pulse 62  Temp(Src) 98.6 F (37 C) (Oral)  Ht 6\' 1"  (1.854 m)  Wt 329 lb 8 oz (149.46 kg)  BMI 43.48 kg/m2  SpO2 97% VS noted,  Constitutional: Pt appears well-developed, well-nourished.  HENT: Head: NCAT.  Right Ear: External ear normal.  Left Ear: External ear normal.  Eyes: . Pupils are equal, round, and reactive to light. Conjunctivae and EOM are normal Neck: Normal range of motion. Neck supple.  Cardiovascular: Normal rate and regular rhythm.   Pulmonary/Chest: Effort normal and breath sounds normal.  Abd:  Soft, NT, ND, + BS Neurological: Pt is alert. Not confused , motor grossly intact Skin: Skin is warm. No rash Psychiatric: Pt behavior is normal. No agitation.     Assessment & Plan:

## 2013-12-03 NOTE — Assessment & Plan Note (Addendum)
Ok to change anusol HC to anamantle HC prn asd,  to f/u any worsening symptoms or concerns, also for stool softner prn to help avoid straining

## 2013-12-03 NOTE — Progress Notes (Signed)
Pre visit review using our clinic review tool, if applicable. No additional management support is needed unless otherwise documented below in the visit note. 

## 2013-12-08 ENCOUNTER — Ambulatory Visit (INDEPENDENT_AMBULATORY_CARE_PROVIDER_SITE_OTHER): Payer: Medicare HMO | Admitting: Cardiovascular Disease

## 2013-12-08 ENCOUNTER — Encounter: Payer: Self-pay | Admitting: Cardiovascular Disease

## 2013-12-08 VITALS — BP 125/81 | HR 57 | Ht 73.0 in | Wt 336.8 lb

## 2013-12-08 DIAGNOSIS — I209 Angina pectoris, unspecified: Secondary | ICD-10-CM

## 2013-12-08 DIAGNOSIS — I2510 Atherosclerotic heart disease of native coronary artery without angina pectoris: Secondary | ICD-10-CM

## 2013-12-08 DIAGNOSIS — I25118 Atherosclerotic heart disease of native coronary artery with other forms of angina pectoris: Secondary | ICD-10-CM

## 2013-12-08 NOTE — Patient Instructions (Signed)
Your physician wants you to follow-up in: 4 MONTHS with Scott [name] PA-C. You will receive a reminder letter in the mail two months in advance. If you don't receive a letter, please call our office to schedule the follow-up appointment.  Your physician recommends that you continue on your current medications as directed. Please refer to the Current Medication list given to you today.   

## 2013-12-08 NOTE — Progress Notes (Signed)
HPI:  58 year old gentleman returning for hospital followup evaluation. He presented with unstable angina 11/13/2013. He has an extensive history of CAD with multiple PCI procedures. His most recent heart catheterization demonstrated severe stenosis of the left circumflex distribution, treated with a drug-eluting stent. He has been maintained on long-term dual antiplatelet therapy with aspirin and Plavix. He's also followed for hypertension and diastolic heart failure. Last lipids 11/16/2013 demonstrated a cholesterol of 130, triglycerides 87, HDL 45, and LDL 68. Liver function tests were within normal limits. Renal function has been normal.  The patient has had no recurrence of angina since his recent PCI procedure. He denies shortness of breath, edema, orthopnea, or PND. He's had no heart palpitations. He has been compliant with his medical program. He's scheduled for orientation with cardiac rehabilitation in a few days. He continues to smoke one half pack per day.  Outpatient Encounter Prescriptions as of 12/08/2013  Medication Sig  . amLODipine (NORVASC) 10 MG tablet Take 1 tablet (10 mg total) by mouth daily.  [name] aspirin 81 MG tablet Take 81 mg by mouth daily.  . carvedilol (COREG) 12.5 MG tablet Take 1 tablet (12.5 mg total) by mouth 2 (two) times daily with a meal.  . cloNIDine (CATAPRES) 0.2 MG tablet Take 0.2 mg by mouth 2 (two) times daily.  . clopidogrel (PLAVIX) 75 MG tablet Take 1 tablet (75 mg total) by mouth every morning.  . Cyanocobalamin (VITAMIN B 12) 100 MCG LOZG Take 200 mcg by mouth daily.  [name] escitalopram (LEXAPRO) 10 MG tablet Take 1 tablet (10 mg total) by mouth daily.  . famotidine (PEPCID) 40 MG tablet Take 40 mg by mouth 2 (two) times daily.  . furosemide (LASIX) 40 MG tablet Take 60 mg by mouth 2 (two) times daily.  . hydrocortisone (ANUSOL-HC) 2.5 % rectal cream Apply rectally 2 times daily  . lidocaine-hydrocortisone (ANAMANTLE HC) 3-0.5 % CREA Place 1 Applicatorful  rectally 2 (two) times daily. As needed  . losartan (COZAAR) 100 MG tablet TAKE 1 TABLET BY MOUTH EVERY DAY  . metFORMIN (GLUCOPHAGE) 500 MG tablet Take 1 tablet (500 mg total) by mouth daily with breakfast. HOLD for 48 hours, receiving on 06/11  . mometasone-formoterol (DULERA) 100-5 MCG/ACT AERO Inhale 2 puffs into the lungs 2 (two) times daily.  . nitroGLYCERIN (NITROSTAT) 0.4 MG SL tablet Place 1 tablet (0.4 mg total) under the tongue every 5 (five) minutes as needed for chest pain. For chest pain  . potassium chloride SA (K-DUR,KLOR-CON) 20 MEQ tablet Take 1 tablet (20 mEq total) by mouth 2 (two) times daily.  . pravastatin (PRAVACHOL) 80 MG tablet Take 1 tablet (80 mg total) by mouth every evening.  . [DISCONTINUED] losartan (COZAAR) 100 MG tablet Take 100 mg by mouth daily.    Allergies  Allergen Reactions  . Penicillins Other (See Comments)    Unknown childhood reaction    Past Medical History  Diagnosis Date  . Dyslipidemia   . HTN (hypertension)   . CAD (coronary artery disease)     a. s/p PCI/BMS pRCA 2002; b. PCI pRCA & DES p/m RCA 2006; c. PCI/DES OM4 2007; d. PCI/CBA to RCA for ISR 10/2006; e.08/2012 STEMI/Cath/PCI: LM nl, LAD95-82m (3.0x20 Promus Prem DES), 95/95 ap LAD (1.0-1.52mm), LCX 30p 95d (sm), LPL1 70-80p, patent stent, LPL2 nl, RCA 30p, 40/70 ISR, EF 35-40%.  . Ischemic cardiomyopathy     a. EF 40%; improved to normal;  b. 08/2012 Echo: EF 50-55%, mod LVH.  [name]  GERD (gastroesophageal reflux disease)   . Tobacco abuse   . Obesity   . OSA (obstructive sleep apnea)     Does not use CPAP as of 05/2011  . Depression   . History of pneumonia   . Diabetes mellitus     a. A1c 8.8 08/2012->Metformin initiated. => b. A1c (9/14): 6.6  . Hyperlipidemia     ROS: Negative except as per HPI  BP 125/81  Pulse 57  Ht 6\' 1"  (1.854 m)  Wt 152.771 kg (336 lb 12.8 oz)  BMI 44.44 kg/m2  PHYSICAL EXAM: Pt is alert and oriented, pleasant obese male in NAD HEENT: normal Neck:  JVP - normal, carotids 2+= without bruits Lungs: CTA bilaterally CV: RRR without murmur or gallop Abd: soft, NT, Positive BS, no hepatomegaly Ext: no C/C/E, distal pulses intact and equal Skin: warm/dry no rash  Cardiac catheterization 11/13/2013: PROCEDURAL FINDINGS  Hemodynamics:  AO 132/77  Coronary angiography:  Coronary dominance: right  Left mainstem: Widely patent without obstruction. Divides into the LAD left circumflex.  Left anterior descending (LAD): The LAD is patent throughout. There are diffuse luminal irregularities in the proximal vessel. The mid vessel has a widely patent stent. The diagonal branches are patent. The mid and distal LAD are patent with mild diffuse disease. The apical portion of the LAD has 75% stenosis.  Left circumflex (LCx): The left circumflex is patent. The vessel is large in its proximal aspect. There are diffuse irregularities. The first OM is stented. Before the stented segment there is an eccentric 80% hypodense stenosis present. The second OM has 60-70% stenosis that involves the origin of the distal AV circumflex which has tight 80-90% stenosis. There were no major vessels beyond that area of stenosis in the distal AV circumflex.  Right coronary artery (RCA): The RCA in its proximal aspect is large. It tapers in the mid vessel in its distal vessels are fairly small in caliber. The proximal vessel has some tortuosity. The mid vessel is extensively stented. There is diffuse 70% in-stent restenosis present. The PDA is small. The acute marginal branch is small.  PCI Note: Following the diagnostic procedure, the decision was made to proceed with PCI. I felt the OM branch of the left circumflex was the likely culprit. His disease in the right coronary artery is diffuse in the vessel is small. I think it is best to leave her medical treatment. The distal AV circumflex also supplies a small area of myocardium and I would recommend medical treatment for this. The  patient has been on long-term aspirin and Plavix. Weight-based bivalirudin was given for anticoagulation. The procedure was performed through the 5 [place] guide catheter. A cougar coronary guidewire was used to cross the lesion. The lesion was predilated with a 2.5 mm balloon. The lesion was then stented with a 2.75x15 mm Xience Alpine drug-eluting stent. The stent was postdilated with a 3.0x12 mm noncompliant balloon to high pressure. Following PCI, there was 0% residual stenosis and TIMI-3 flow. Final angiography confirmed an excellent result. The patient tolerated the procedure well. There were no immediate procedural complications. A TR band was used for radial hemostasis. The patient was transferred to the post catheterization recovery area for further monitoring.  PCI Data:  Vessel - OM1/Segment - proximal  Percent Stenosis (pre) 0  TIMI-flow 3  Stent 2.75x15 mm Xience Alpine DES  Percent Stenosis (post) 0  TIMI-flow (post) 3  Final Conclusions:  1. moderate diffuse in-stent restenosis in the right coronary artery, unchanged  from the previous study  2. Continued patency of the LAD stented segment  3. Severe stenosis of the first obtuse marginal treated successfully with PCI using a drug-eluting stent    2-D echocardiogram 11/17/2013:  Study Conclusions  - Left ventricle: The cavity size was mildly dilated. Wall thickness was increased in a pattern of mild LVH. Systolic function was normal. The estimated ejection fraction was in the range of 50% to 55%. Wall motion was normal; there were no regional wall motion abnormalities. Doppler parameters are consistent with abnormal left ventricular relaxation (grade 1 diastolic dysfunction). - Left atrium: The atrium was mildly dilated. - Right ventricle: The cavity size was moderately dilated. - Right atrium: The atrium was mildly to moderately dilated.  ASSESSMENT AND PLAN: 1. Coronary artery disease, native vessel. Patient with recent  episode of unstable angina pectoris. He will continue his current medical program. He's had no recurrence of angina since his recent PCI procedure. He will be obligated to long-term dual antiplatelet therapy.  2. Hyperlipidemia. Lipids at goal on pravastatin 80 mg.  3. Essential hypertension. Blood pressure is at goal on a combination of multidrug antihypertensive therapy. His medications were reviewed.  4. Tobacco abuse. Smoking cessation counseling was done again. He probably will not be able to quit. Cigarettes are a coping mechanism for stress.  We reviewed all of his modifiable risk factors, which include ongoing tobacco use, obesity, and medication adherence. He will continue to work on diet and tobacco cessation. He has been compliant with his medications  For followup I would like him to see [name] in 4 months.  Sherren Mocha 12/08/2013 9:02 AM

## 2013-12-09 ENCOUNTER — Telehealth: Payer: Self-pay | Admitting: *Deleted

## 2013-12-09 DIAGNOSIS — K644 Residual hemorrhoidal skin tags: Secondary | ICD-10-CM

## 2013-12-09 NOTE — Telephone Encounter (Signed)
referral done.

## 2013-12-09 NOTE — Telephone Encounter (Signed)
Pt states he would like md to go ahead an refer him out to see someone concerning the hemorrhoids...[name] [name]

## 2013-12-09 NOTE — Telephone Encounter (Signed)
Called pt no answer LMOM with md response.../lmb 

## 2013-12-10 ENCOUNTER — Ambulatory Visit (HOSPITAL_COMMUNITY): Payer: Medicare HMO

## 2013-12-14 ENCOUNTER — Ambulatory Visit (HOSPITAL_COMMUNITY): Payer: Medicare HMO

## 2013-12-16 ENCOUNTER — Ambulatory Visit (HOSPITAL_COMMUNITY): Payer: Medicare HMO

## 2013-12-16 ENCOUNTER — Other Ambulatory Visit: Payer: Self-pay | Admitting: Physician Assistant

## 2013-12-18 ENCOUNTER — Ambulatory Visit (HOSPITAL_COMMUNITY): Payer: Medicare HMO

## 2013-12-21 ENCOUNTER — Ambulatory Visit (HOSPITAL_COMMUNITY): Payer: Medicare HMO

## 2013-12-23 ENCOUNTER — Ambulatory Visit (HOSPITAL_COMMUNITY): Payer: Medicare HMO

## 2013-12-24 ENCOUNTER — Ambulatory Visit (INDEPENDENT_AMBULATORY_CARE_PROVIDER_SITE_OTHER): Payer: Commercial Managed Care - HMO | Admitting: General Surgery

## 2013-12-24 ENCOUNTER — Ambulatory Visit: Payer: Medicare HMO | Admitting: Internal Medicine

## 2013-12-25 ENCOUNTER — Ambulatory Visit (HOSPITAL_COMMUNITY): Payer: Medicare HMO

## 2013-12-28 ENCOUNTER — Other Ambulatory Visit: Payer: Self-pay | Admitting: Cardiovascular Disease

## 2013-12-28 ENCOUNTER — Ambulatory Visit (HOSPITAL_COMMUNITY): Payer: Medicare HMO

## 2013-12-29 ENCOUNTER — Encounter: Payer: Self-pay | Admitting: Internal Medicine

## 2013-12-29 ENCOUNTER — Ambulatory Visit (INDEPENDENT_AMBULATORY_CARE_PROVIDER_SITE_OTHER): Payer: Medicare HMO | Admitting: Internal Medicine

## 2013-12-29 VITALS — BP 142/90 | HR 61 | Temp 98.4°F | Ht 73.0 in | Wt 343.1 lb

## 2013-12-29 DIAGNOSIS — E785 Hyperlipidemia, unspecified: Secondary | ICD-10-CM

## 2013-12-29 DIAGNOSIS — G629 Polyneuropathy, unspecified: Secondary | ICD-10-CM | POA: Insufficient documentation

## 2013-12-29 DIAGNOSIS — E118 Type 2 diabetes mellitus with unspecified complications: Secondary | ICD-10-CM

## 2013-12-29 DIAGNOSIS — Z0000 Encounter for general adult medical examination without abnormal findings: Secondary | ICD-10-CM

## 2013-12-29 DIAGNOSIS — I10 Essential (primary) hypertension: Secondary | ICD-10-CM

## 2013-12-29 DIAGNOSIS — F17200 Nicotine dependence, unspecified, uncomplicated: Secondary | ICD-10-CM

## 2013-12-29 MED ORDER — LANCETS MISC: 1.0000 "application " | Freq: Every day | Status: DC

## 2013-12-29 MED ORDER — GLUCOSE BLOOD VI STRP: ORAL_STRIP | Status: DC

## 2013-12-29 NOTE — Assessment & Plan Note (Signed)
stable overall by history and exam, recent data reviewed with pt, and pt to continue medical treatment as before,  to f/u any worsening symptoms or concerns BP Readings from Last 3 Encounters:  12/29/13 142/90  12/08/13 125/81  12/03/13 132/80

## 2013-12-29 NOTE — Assessment & Plan Note (Signed)
Urged to quit, to use nicotine patches

## 2013-12-29 NOTE — Assessment & Plan Note (Signed)
stable overall by history and exam, recent data reviewed with pt, and pt to continue medical treatment as before,  to f/u any worsening symptoms or concerns Lab Results  Component Value Date   LDLCALC 68 11/16/2013

## 2013-12-29 NOTE — Progress Notes (Signed)
Subjective:    Patient ID: [name] [name], male    DOB: July 04, 1955, 58 y.o.   MRN: [id number]  HPI  Here to f/u; overall doing ok,  Pt denies chest pain, increased sob or doe, wheezing, orthopnea, PND, increased LE swelling, palpitations, dizziness or syncope.  Pt denies polydipsia, polyuria, or low sugar symptoms such as weakness or confusion improved with po intake.  Pt denies new neurological symptoms such as new headache, or facial or extremity weakness or numbness.   Pt states overall good compliance with meds, has been trying to follow lower cholesterol, diabetic diet, with wt overall up at this office to 343 from 318 in jan 2015.  Plans to be more active.  Not really checking wts at home. Overall good compliance with treatment, and good medicine tolerability. Including the lasix at 60 bid. Plans to try nicotine patch for quitting smoking, maybe chantix later due to cost.  Still walks with cane, for balance issue assoc with periph neuropathy, has seen Dr Yan/neurology, but did not have rec'd MRI due to cost or f/u Past Medical History  Diagnosis Date  . Dyslipidemia   . HTN (hypertension)   . CAD (coronary artery disease)     a. s/p PCI/BMS pRCA 2002; b. PCI pRCA & DES p/m RCA 2006; c. PCI/DES OM4 2007; d. PCI/CBA to RCA for ISR 10/2006; e.08/2012 STEMI/Cath/PCI: LM nl, LAD95-69m (3.0x20 Promus Prem DES), 95/95 ap LAD (1.0-1.87mm), LCX 30p 95d (sm), LPL1 70-80p, patent stent, LPL2 nl, RCA 30p, 40/70 ISR, EF 35-40%.  . Ischemic cardiomyopathy     a. EF 40%; improved to normal;  b. 08/2012 Echo: EF 50-55%, mod LVH.  [name] GERD (gastroesophageal reflux disease)   . Tobacco abuse   . Obesity   . OSA (obstructive sleep apnea)     Does not use CPAP as of 05/2011  . Depression   . History of pneumonia   . Diabetes mellitus     a. A1c 8.8 08/2012->Metformin initiated. => b. A1c (9/14): 6.6  . Hyperlipidemia    Past Surgical History  Procedure Laterality Date  . Coronary stent placement  2009  .  Coronary angioplasty with stent placement      reports that he has been smoking Cigarettes.  He has a 7.5 pack-year smoking history. He does not have any smokeless tobacco history on file. He reports that he does not drink alcohol or use illicit drugs. family history includes Cancer in his mother; Coronary artery disease in an other family member; Depression in his mother; Hypertension in his mother; Other in his father. Allergies  Allergen Reactions  . Penicillins Other (See Comments)    Unknown childhood reaction   Current Outpatient Prescriptions on File Prior to Visit  Medication Sig Dispense Refill  . amLODipine (NORVASC) 10 MG tablet Take 1 tablet (10 mg total) by mouth daily.  90 tablet  3  . aspirin 81 MG tablet Take 81 mg by mouth daily.      . carvedilol (COREG) 12.5 MG tablet Take 1 tablet (12.5 mg total) by mouth 2 (two) times daily with a meal.  60 tablet  11  . cloNIDine (CATAPRES) 0.2 MG tablet Take 0.2 mg by mouth 2 (two) times daily.      . clopidogrel (PLAVIX) 75 MG tablet Take 1 tablet (75 mg total) by mouth every morning.  90 tablet  3  . Cyanocobalamin (VITAMIN B 12) 100 MCG LOZG Take 200 mcg by mouth daily.      [name]  escitalopram (LEXAPRO) 10 MG tablet Take 1 tablet (10 mg total) by mouth daily.  90 tablet  3  . furosemide (LASIX) 40 MG tablet Take 60 mg by mouth 2 (two) times daily.      . hydrocortisone (ANUSOL-HC) 2.5 % rectal cream Apply rectally 2 times daily  30 g  0  . lidocaine-hydrocortisone (ANAMANTLE HC) 3-0.5 % CREA Place 1 Applicatorful rectally 2 (two) times daily. As needed  28.35 g  1  . losartan (COZAAR) 100 MG tablet TAKE 1 TABLET BY MOUTH EVERY DAY  90 tablet  0  . metFORMIN (GLUCOPHAGE) 500 MG tablet Take 1 tablet (500 mg total) by mouth daily with breakfast. HOLD for 48 hours, receiving on 06/11      . mometasone-formoterol (DULERA) 100-5 MCG/ACT AERO Inhale 2 puffs into the lungs 2 (two) times daily.  13 g  11  . nitroGLYCERIN (NITROSTAT) 0.4 MG SL  tablet Place 1 tablet (0.4 mg total) under the tongue every 5 (five) minutes as needed for chest pain. For chest pain  25 tablet  12  . potassium chloride SA (K-DUR,KLOR-CON) 20 MEQ tablet Take 1 tablet (20 mEq total) by mouth 2 (two) times daily.  60 tablet  6  . pravastatin (PRAVACHOL) 80 MG tablet Take 1 tablet (80 mg total) by mouth every evening.  90 tablet  3   No current facility-administered medications on file prior to visit.      Review of Systems  Constitutional: Negative for unusual diaphoresis or other sweats  HENT: Negative for ringing in ear Eyes: Negative for double vision or worsening visual disturbance.  Respiratory: Negative for choking and stridor.   Gastrointestinal: Negative for vomiting or other signifcant bowel change Genitourinary: Negative for hematuria or decreased urine volume.  Musculoskeletal: Negative for other MSK pain or swelling Skin: Negative for color change and worsening wound.  Neurological: Negative for tremors and numbness other than noted  Psychiatric/Behavioral: Negative for decreased concentration or agitation other than above       Objective:   Physical Exam BP 142/90  Pulse 61  Temp(Src) 98.4 F (36.9 C) (Oral)  Ht 6\' 1"  (1.854 m)  Wt 343 lb 2 oz (155.64 kg)  BMI 45.28 kg/m2  SpO2 97% VS noted,  Constitutional: Pt appears well-developed, well-nourished.  HENT: Head: NCAT.  Right Ear: External ear normal.  Left Ear: External ear normal.  Eyes: . Pupils are equal, round, and reactive to light. Conjunctivae and EOM are normal Neck: Normal range of motion. Neck supple.  Cardiovascular: Normal rate and regular rhythm.   Pulmonary/Chest: Effort normal and breath sounds normal.  Abd:  Soft, NT, ND, + BS Neurological: Pt is alert. Not confused , motor grossly intact Skin: Skin is warm. No rash Trace to 1+ ankle edema Psychiatric: Pt behavior is normal. No agitation.         Assessment & Plan:   BP Readings from Last 3  Encounters:  12/29/13 142/90  12/08/13 125/81  12/03/13 132/80

## 2013-12-29 NOTE — Assessment & Plan Note (Signed)
stable overall by history and exam, recent data reviewed with pt, and pt to continue medical treatment as before,  to f/u any worsening symptoms or concerns Lab Results  Component Value Date   HGBA1C 6.6* 06/29/2013

## 2013-12-29 NOTE — Patient Instructions (Signed)
Please continue all other medications as before, and refills have been done if requested.  Please have the pharmacy call with any other refills you may need.  Please continue your efforts at being more active, low cholesterol diet, and weight control.  You are otherwise up to date with prevention measures today.  Please keep your appointments with your specialists as you may have planned  Please return in 6 months, or sooner if needed, with Lab testing done 3-5 days before  

## 2013-12-29 NOTE — Progress Notes (Signed)
Pre visit review using our clinic review tool, if applicable. No additional management support is needed unless otherwise documented below in the visit note. 

## 2013-12-30 ENCOUNTER — Telehealth: Payer: Self-pay | Admitting: Internal Medicine

## 2013-12-30 ENCOUNTER — Ambulatory Visit (HOSPITAL_COMMUNITY): Payer: Medicare HMO

## 2013-12-30 NOTE — Telephone Encounter (Signed)
Relevant patient education assigned to patient using Emmi. ° °

## 2014-01-01 ENCOUNTER — Ambulatory Visit (HOSPITAL_COMMUNITY): Payer: Medicare HMO

## 2014-01-04 ENCOUNTER — Ambulatory Visit (HOSPITAL_COMMUNITY): Payer: Medicare HMO

## 2014-01-06 ENCOUNTER — Ambulatory Visit (HOSPITAL_COMMUNITY): Payer: Medicare HMO

## 2014-01-08 ENCOUNTER — Ambulatory Visit (HOSPITAL_COMMUNITY): Payer: Medicare HMO

## 2014-01-11 ENCOUNTER — Ambulatory Visit (HOSPITAL_COMMUNITY): Payer: Medicare HMO

## 2014-01-13 ENCOUNTER — Ambulatory Visit (HOSPITAL_COMMUNITY): Payer: Medicare HMO

## 2014-01-15 ENCOUNTER — Ambulatory Visit (HOSPITAL_COMMUNITY): Payer: Medicare HMO

## 2014-01-18 ENCOUNTER — Ambulatory Visit (HOSPITAL_COMMUNITY): Payer: Medicare HMO

## 2014-01-18 ENCOUNTER — Ambulatory Visit (INDEPENDENT_AMBULATORY_CARE_PROVIDER_SITE_OTHER): Payer: Commercial Managed Care - HMO | Admitting: General Surgery

## 2014-01-19 ENCOUNTER — Telehealth: Payer: Self-pay

## 2014-01-19 NOTE — Telephone Encounter (Signed)
Called and advised patient to stop by our lab to have his labs done. Patient states that he will stop by this week.

## 2014-01-20 ENCOUNTER — Ambulatory Visit (HOSPITAL_COMMUNITY): Payer: Medicare HMO

## 2014-01-22 ENCOUNTER — Ambulatory Visit (HOSPITAL_COMMUNITY): Payer: Medicare HMO

## 2014-01-25 ENCOUNTER — Ambulatory Visit (HOSPITAL_COMMUNITY): Payer: Medicare HMO

## 2014-01-26 ENCOUNTER — Other Ambulatory Visit (INDEPENDENT_AMBULATORY_CARE_PROVIDER_SITE_OTHER): Payer: Commercial Managed Care - HMO

## 2014-01-26 ENCOUNTER — Encounter: Payer: Self-pay | Admitting: Internal Medicine

## 2014-01-26 DIAGNOSIS — R269 Unspecified abnormalities of gait and mobility: Secondary | ICD-10-CM

## 2014-01-26 DIAGNOSIS — R51 Headache: Secondary | ICD-10-CM

## 2014-01-26 DIAGNOSIS — Q6689 Other  specified congenital deformities of feet: Secondary | ICD-10-CM

## 2014-01-26 DIAGNOSIS — G609 Hereditary and idiopathic neuropathy, unspecified: Secondary | ICD-10-CM

## 2014-01-26 DIAGNOSIS — Q667 Congenital pes cavus, unspecified foot: Secondary | ICD-10-CM

## 2014-01-26 DIAGNOSIS — E118 Type 2 diabetes mellitus with unspecified complications: Secondary | ICD-10-CM

## 2014-01-26 DIAGNOSIS — Z0000 Encounter for general adult medical examination without abnormal findings: Secondary | ICD-10-CM

## 2014-01-26 DIAGNOSIS — R519 Headache, unspecified: Secondary | ICD-10-CM

## 2014-01-26 LAB — CBC WITH DIFFERENTIAL/PLATELET
BASOS PCT: 0.4 % (ref 0.0–3.0)
Basophils Absolute: 0 10*3/uL (ref 0.0–0.1)
Eosinophils Absolute: 0.2 10*3/uL (ref 0.0–0.7)
Eosinophils Relative: 2.4 % (ref 0.0–5.0)
HEMATOCRIT: 47.2 % (ref 39.0–52.0)
HEMOGLOBIN: 15.6 g/dL (ref 13.0–17.0)
Lymphocytes Relative: 48.2 % — ABNORMAL HIGH (ref 12.0–46.0)
Lymphs Abs: 3.3 10*3/uL (ref 0.7–4.0)
MCHC: 33 g/dL (ref 30.0–36.0)
MCV: 87.7 fl (ref 78.0–100.0)
MONOS PCT: 9.8 % (ref 3.0–12.0)
Monocytes Absolute: 0.7 10*3/uL (ref 0.1–1.0)
NEUTROS ABS: 2.6 10*3/uL (ref 1.4–7.7)
Neutrophils Relative %: 39.2 % — ABNORMAL LOW (ref 43.0–77.0)
Platelets: 170 10*3/uL (ref 150.0–400.0)
RBC: 5.38 Mil/uL (ref 4.22–5.81)
RDW: 16 % — ABNORMAL HIGH (ref 11.5–15.5)
WBC: 6.8 10*3/uL (ref 4.0–10.5)

## 2014-01-26 LAB — HEPATIC FUNCTION PANEL
ALBUMIN: 3.9 g/dL (ref 3.5–5.2)
ALT: 25 U/L (ref 0–53)
AST: 22 U/L (ref 0–37)
Alkaline Phosphatase: 68 U/L (ref 39–117)
Bilirubin, Direct: 0.1 mg/dL (ref 0.0–0.3)
Total Bilirubin: 0.8 mg/dL (ref 0.2–1.2)
Total Protein: 7.1 g/dL (ref 6.0–8.3)

## 2014-01-26 LAB — BASIC METABOLIC PANEL
BUN: 15 mg/dL (ref 6–23)
CALCIUM: 9.4 mg/dL (ref 8.4–10.5)
CO2: 29 meq/L (ref 19–32)
CREATININE: 1.2 mg/dL (ref 0.4–1.5)
Chloride: 106 mEq/L (ref 96–112)
GFR: 82.65 mL/min (ref >60.00)
GLUCOSE: 155 mg/dL — AB (ref 70–99)
Potassium: 4 mEq/L (ref 3.5–5.1)
Sodium: 142 mEq/L (ref 135–145)

## 2014-01-26 LAB — URINALYSIS, ROUTINE W REFLEX MICROSCOPIC
Bilirubin Urine: NEGATIVE
Hgb urine dipstick: NEGATIVE
Ketones, ur: NEGATIVE
Leukocytes, UA: NEGATIVE
Nitrite: NEGATIVE
PH: 6 (ref 5.0–8.0)
URINE GLUCOSE: NEGATIVE
UROBILINOGEN UA: 0.2 (ref 0.0–1.0)

## 2014-01-26 LAB — HEMOGLOBIN A1C: HEMOGLOBIN A1C: 7.2 % — AB (ref 4.6–6.5)

## 2014-01-26 LAB — LIPID PANEL
Cholesterol: 134 mg/dL (ref 0–200)
HDL: 44.4 mg/dL (ref >39.00)
LDL CALC: 81 mg/dL (ref 0–99)
NonHDL: 89.6
Total CHOL/HDL Ratio: 3
Triglycerides: 43 mg/dL (ref 0.0–149.0)
VLDL: 8.6 mg/dL (ref 0.0–40.0)

## 2014-01-26 LAB — PSA: PSA: 0.75 ng/mL (ref 0.10–4.00)

## 2014-01-26 LAB — TSH: TSH: 0.97 u[IU]/mL (ref 0.35–4.50)

## 2014-01-26 LAB — MICROALBUMIN / CREATININE URINE RATIO
Creatinine,U: 41.7 mg/dL
Microalb Creat Ratio: 39.4 mg/g — ABNORMAL HIGH (ref 0.0–30.0)
Microalb, Ur: 16.4 mg/dL — ABNORMAL HIGH (ref 0.0–1.9)

## 2014-01-27 ENCOUNTER — Ambulatory Visit (HOSPITAL_COMMUNITY): Payer: Medicare HMO

## 2014-01-29 ENCOUNTER — Ambulatory Visit (HOSPITAL_COMMUNITY): Payer: Medicare HMO

## 2014-02-01 ENCOUNTER — Ambulatory Visit (HOSPITAL_COMMUNITY): Payer: Medicare HMO

## 2014-02-03 ENCOUNTER — Ambulatory Visit (HOSPITAL_COMMUNITY): Payer: Medicare HMO

## 2014-02-05 ENCOUNTER — Ambulatory Visit (HOSPITAL_COMMUNITY): Payer: Medicare HMO

## 2014-02-08 ENCOUNTER — Ambulatory Visit (HOSPITAL_COMMUNITY): Payer: Medicare HMO

## 2014-02-10 ENCOUNTER — Ambulatory Visit (HOSPITAL_COMMUNITY): Payer: Medicare HMO

## 2014-02-12 ENCOUNTER — Ambulatory Visit (HOSPITAL_COMMUNITY): Payer: Medicare HMO

## 2014-02-17 ENCOUNTER — Ambulatory Visit (HOSPITAL_COMMUNITY): Payer: Medicare HMO

## 2014-02-19 ENCOUNTER — Telehealth: Payer: Self-pay | Admitting: *Deleted

## 2014-02-19 ENCOUNTER — Ambulatory Visit (HOSPITAL_COMMUNITY): Payer: Medicare HMO

## 2014-02-19 NOTE — Telephone Encounter (Signed)
VM left for pt about BP f/u check for DB

## 2014-02-22 ENCOUNTER — Ambulatory Visit (HOSPITAL_COMMUNITY): Payer: Medicare HMO

## 2014-02-24 ENCOUNTER — Ambulatory Visit (HOSPITAL_COMMUNITY): Payer: Medicare HMO

## 2014-02-26 ENCOUNTER — Ambulatory Visit (HOSPITAL_COMMUNITY): Payer: Medicare HMO

## 2014-02-27 ENCOUNTER — Other Ambulatory Visit: Payer: Self-pay | Admitting: Cardiovascular Disease

## 2014-03-01 ENCOUNTER — Ambulatory Visit (HOSPITAL_COMMUNITY): Payer: Medicare HMO

## 2014-03-03 ENCOUNTER — Ambulatory Visit (HOSPITAL_COMMUNITY): Payer: Medicare HMO

## 2014-03-05 ENCOUNTER — Ambulatory Visit (HOSPITAL_COMMUNITY): Payer: Medicare HMO

## 2014-03-08 ENCOUNTER — Ambulatory Visit (HOSPITAL_COMMUNITY): Payer: Medicare HMO

## 2014-03-10 ENCOUNTER — Ambulatory Visit (HOSPITAL_COMMUNITY): Payer: Medicare HMO

## 2014-03-11 ENCOUNTER — Other Ambulatory Visit: Payer: Self-pay | Admitting: Cardiovascular Disease

## 2014-03-12 ENCOUNTER — Inpatient Hospital Stay (HOSPITAL_COMMUNITY)
Admission: EM | Admit: 2014-03-12 | Discharge: 2014-03-16 | DRG: 281 | Disposition: A | Discharge status: Home / Self Care | Payer: Medicare HMO | Attending: Internal Medicine | Admitting: Internal Medicine

## 2014-03-12 ENCOUNTER — Encounter (HOSPITAL_COMMUNITY): Payer: Self-pay | Admitting: Emergency Medicine

## 2014-03-12 ENCOUNTER — Emergency Department (HOSPITAL_COMMUNITY): Payer: Medicare HMO

## 2014-03-12 ENCOUNTER — Ambulatory Visit (HOSPITAL_COMMUNITY): Payer: Medicare HMO

## 2014-03-12 DIAGNOSIS — R002 Palpitations: Secondary | ICD-10-CM | POA: Diagnosis present

## 2014-03-12 DIAGNOSIS — Z809 Family history of malignant neoplasm, unspecified: Secondary | ICD-10-CM

## 2014-03-12 DIAGNOSIS — Z794 Long term (current) use of insulin: Secondary | ICD-10-CM | POA: Diagnosis not present

## 2014-03-12 DIAGNOSIS — Z8249 Family history of ischemic heart disease and other diseases of the circulatory system: Secondary | ICD-10-CM

## 2014-03-12 DIAGNOSIS — E669 Obesity, unspecified: Secondary | ICD-10-CM | POA: Diagnosis present

## 2014-03-12 DIAGNOSIS — F17200 Nicotine dependence, unspecified, uncomplicated: Secondary | ICD-10-CM

## 2014-03-12 DIAGNOSIS — R001 Bradycardia, unspecified: Secondary | ICD-10-CM | POA: Diagnosis present

## 2014-03-12 DIAGNOSIS — Z955 Presence of coronary angioplasty implant and graft: Secondary | ICD-10-CM | POA: Diagnosis not present

## 2014-03-12 DIAGNOSIS — I25110 Atherosclerotic heart disease of native coronary artery with unstable angina pectoris: Secondary | ICD-10-CM | POA: Diagnosis present

## 2014-03-12 DIAGNOSIS — I200 Unstable angina: Secondary | ICD-10-CM | POA: Diagnosis present

## 2014-03-12 DIAGNOSIS — F329 Major depressive disorder, single episode, unspecified: Secondary | ICD-10-CM | POA: Diagnosis present

## 2014-03-12 DIAGNOSIS — I5033 Acute on chronic diastolic (congestive) heart failure: Secondary | ICD-10-CM

## 2014-03-12 DIAGNOSIS — Z7982 Long term (current) use of aspirin: Secondary | ICD-10-CM | POA: Diagnosis not present

## 2014-03-12 DIAGNOSIS — K219 Gastro-esophageal reflux disease without esophagitis: Secondary | ICD-10-CM | POA: Diagnosis present

## 2014-03-12 DIAGNOSIS — Z720 Tobacco use: Secondary | ICD-10-CM

## 2014-03-12 DIAGNOSIS — I10 Essential (primary) hypertension: Secondary | ICD-10-CM | POA: Diagnosis present

## 2014-03-12 DIAGNOSIS — Z9229 Personal history of other drug therapy: Secondary | ICD-10-CM | POA: Diagnosis not present

## 2014-03-12 DIAGNOSIS — Z6841 Body Mass Index (BMI) 40.0 and over, adult: Secondary | ICD-10-CM | POA: Diagnosis not present

## 2014-03-12 DIAGNOSIS — E119 Type 2 diabetes mellitus without complications: Secondary | ICD-10-CM

## 2014-03-12 DIAGNOSIS — I252 Old myocardial infarction: Secondary | ICD-10-CM | POA: Diagnosis not present

## 2014-03-12 DIAGNOSIS — I4510 Unspecified right bundle-branch block: Secondary | ICD-10-CM | POA: Diagnosis present

## 2014-03-12 DIAGNOSIS — Z23 Encounter for immunization: Secondary | ICD-10-CM

## 2014-03-12 DIAGNOSIS — G4733 Obstructive sleep apnea (adult) (pediatric): Secondary | ICD-10-CM | POA: Diagnosis present

## 2014-03-12 DIAGNOSIS — Z9861 Coronary angioplasty status: Secondary | ICD-10-CM

## 2014-03-12 DIAGNOSIS — Z7902 Long term (current) use of antithrombotics/antiplatelets: Secondary | ICD-10-CM | POA: Diagnosis not present

## 2014-03-12 DIAGNOSIS — I255 Ischemic cardiomyopathy: Secondary | ICD-10-CM | POA: Diagnosis present

## 2014-03-12 DIAGNOSIS — I5032 Chronic diastolic (congestive) heart failure: Secondary | ICD-10-CM | POA: Diagnosis present

## 2014-03-12 DIAGNOSIS — F17210 Nicotine dependence, cigarettes, uncomplicated: Secondary | ICD-10-CM | POA: Diagnosis present

## 2014-03-12 DIAGNOSIS — R079 Chest pain, unspecified: Secondary | ICD-10-CM

## 2014-03-12 DIAGNOSIS — Z9119 Patient's noncompliance with other medical treatment and regimen: Secondary | ICD-10-CM | POA: Diagnosis present

## 2014-03-12 DIAGNOSIS — I2583 Coronary atherosclerosis due to lipid rich plaque: Secondary | ICD-10-CM

## 2014-03-12 DIAGNOSIS — E785 Hyperlipidemia, unspecified: Secondary | ICD-10-CM | POA: Diagnosis present

## 2014-03-12 DIAGNOSIS — I214 Non-ST elevation (NSTEMI) myocardial infarction: Secondary | ICD-10-CM | POA: Diagnosis present

## 2014-03-12 DIAGNOSIS — Z880 Allergy status to penicillin: Secondary | ICD-10-CM

## 2014-03-12 DIAGNOSIS — I2510 Atherosclerotic heart disease of native coronary artery without angina pectoris: Secondary | ICD-10-CM | POA: Diagnosis present

## 2014-03-12 LAB — CBC
HEMATOCRIT: 47.8 % (ref 39.0–52.0)
HEMOGLOBIN: 16.3 g/dL (ref 13.0–17.0)
MCH: 28.8 pg (ref 26.0–34.0)
MCHC: 34.1 g/dL (ref 30.0–36.0)
MCV: 84.6 fL (ref 78.0–100.0)
Platelets: 192 10*3/uL (ref 150–400)
RBC: 5.65 MIL/uL (ref 4.22–5.81)
RDW: 15.4 % (ref 11.5–15.5)
WBC: 8.3 10*3/uL (ref 4.0–10.5)

## 2014-03-12 LAB — BASIC METABOLIC PANEL
Anion gap: 15 (ref 5–15)
BUN: 17 mg/dL (ref 6–23)
CO2: 26 meq/L (ref 19–32)
CREATININE: 1.13 mg/dL (ref 0.50–1.35)
Calcium: 9.9 mg/dL (ref 8.4–10.5)
Chloride: 104 mEq/L (ref 96–112)
GFR calc Af Amer: 82 mL/min — ABNORMAL LOW (ref >90)
GFR calc non Af Amer: 70 mL/min — ABNORMAL LOW (ref >90)
GLUCOSE: 137 mg/dL — AB (ref 70–99)
Potassium: 3.6 mEq/L — ABNORMAL LOW (ref 3.7–5.3)
Sodium: 145 mEq/L (ref 137–147)

## 2014-03-12 LAB — I-STAT TROPONIN, ED: Troponin i, poc: 0.08 ng/mL (ref 0.00–0.08)

## 2014-03-12 LAB — PRO B NATRIURETIC PEPTIDE: Pro B Natriuretic peptide (BNP): 229 pg/mL — ABNORMAL HIGH (ref 0–125)

## 2014-03-12 LAB — TROPONIN I: Troponin I: 0.38 ng/mL (ref <0.30)

## 2014-03-12 MED ORDER — HEPARIN BOLUS VIA INFUSION
4000.0000 [IU] | Freq: Once | INTRAVENOUS | Status: AC
  Administered 2014-03-13: 4000 [IU] via INTRAVENOUS
  Filled 2014-03-12: qty 4000

## 2014-03-12 MED ORDER — HEPARIN (PORCINE) IN NACL 100-0.45 UNIT/ML-% IJ SOLN
1750.0000 [IU]/h | INTRAMUSCULAR | Status: DC
  Administered 2014-03-13: 1850 [IU]/h via INTRAVENOUS
  Administered 2014-03-13: 1700 [IU]/h via INTRAVENOUS
  Administered 2014-03-13: 1850 [IU]/h via INTRAVENOUS
  Administered 2014-03-14: 1750 [IU]/h via INTRAVENOUS
  Administered 2014-03-14: 1850 [IU]/h via INTRAVENOUS
  Administered 2014-03-15: 1750 [IU]/h via INTRAVENOUS
  Filled 2014-03-12 (×8): qty 250

## 2014-03-12 MED ORDER — CLONIDINE HCL 0.2 MG PO TABS
0.2000 mg | ORAL_TABLET | Freq: Once | ORAL | Status: AC
  Administered 2014-03-12: 0.2 mg via ORAL
  Filled 2014-03-12: qty 1

## 2014-03-12 MED ORDER — POTASSIUM CHLORIDE CRYS ER 20 MEQ PO TBCR
40.0000 meq | EXTENDED_RELEASE_TABLET | Freq: Once | ORAL | Status: AC
Start: 2014-03-12 — End: 2014-03-12
  Administered 2014-03-12: 40 meq via ORAL
  Filled 2014-03-12: qty 2

## 2014-03-12 MED ORDER — FUROSEMIDE 10 MG/ML IJ SOLN
40.0000 mg | Freq: Once | INTRAMUSCULAR | Status: AC
  Administered 2014-03-12: 40 mg via INTRAVENOUS
  Filled 2014-03-12: qty 4

## 2014-03-12 NOTE — ED Notes (Signed)
Admitting MD paged 

## 2014-03-12 NOTE — ED Notes (Signed)
C/o sudden onset chest pain nonradiating substernal denies n/v no diaphoresis pt took ntg x 1 mnedics gave asa 324 mg po and ntg x1 then pain free

## 2014-03-12 NOTE — ED Notes (Signed)
Attempted to give report 

## 2014-03-12 NOTE — Progress Notes (Addendum)
ANTICOAGULATION CONSULT NOTE - Initial Consult  Pharmacy Consult for Heparin Indication: chest pain/ACS  Allergies  Allergen Reactions  . Penicillins Other (See Comments)    Unknown childhood reaction    Patient Measurements: Height: 6\' 2"  (188 cm) Weight: 330 lb (149.687 kg) IBW/kg (Calculated) : 82.2 Heparin Dosing Weight: 104 kg  Vital Signs: BP: 173/84 mmHg (10/02 2245) Pulse Rate: 70 (10/02 2245)  Labs:  Recent Labs  03/12/14 1911 03/12/14 2205  HGB 16.3  --   HCT 47.8  --   PLT 192  --   CREATININE 1.13  --   TROPONINI  --  0.38*    Estimated Creatinine Clearance: 111.4 ml/min (by C-G formula based on Cr of 1.13).   Medical History: Past Medical History  Diagnosis Date  . Dyslipidemia   . HTN (hypertension)   . CAD (coronary artery disease)     a. s/p PCI/BMS pRCA 2002; b. PCI pRCA & DES p/m RCA 2006; c. PCI/DES OM4 2007; d. PCI/CBA to RCA for ISR 10/2006; e.08/2012 STEMI/Cath/PCI: LM nl, LAD95-16m (3.0x20 Promus Prem DES), 95/95 ap LAD (1.0-1.3mm), LCX 30p 95d (sm), LPL1 70-80p, patent stent, LPL2 nl, RCA 30p, 40/70 ISR, EF 35-40%.  . Ischemic cardiomyopathy     a. EF 40%; improved to normal;  b. 08/2012 Echo: EF 50-55%, mod LVH.  [name] GERD (gastroesophageal reflux disease)   . Tobacco abuse   . Obesity   . OSA (obstructive sleep apnea)     Does not use CPAP as of 05/2011  . Depression   . History of pneumonia   . Diabetes mellitus     a. A1c 8.8 08/2012->Metformin initiated. => b. A1c (9/14): 6.6  . Hyperlipidemia     Medications:  See med hstory  Assessment: 58 yo man to start heparin for CP, r/o ACS.   Goal of Therapy:  Heparin level 0.3-0.7 units/ml Monitor platelets by anticoagulation protocol: Yes   Plan:  Heparin bolus 4000 units and drip at 1700 units/hr Check heparin level 6 hours after start Daily heparin level and CBC while on heparin Monitor for bleeding complications  Thanks for allowing pharmacy to be a part of this patient's  care.  [name], PharmD Clinical Pharmacist, [phone] 03/12/2014,10:59 PM  Addum:  Heparin level 0.29 units/ml Will increase drip to 1850 units/hr and check level in 6 hours. [name], PharmD

## 2014-03-12 NOTE — H&P (Addendum)
Cardiologist: [name] [name]  HPI:  58 year old morbidly obese smoker with DM2, HTN, HL, diastolic HF (EF 09-98%), extensive history of CAD with multiple PCI procedures. Presents to ER with CP.  Last cath in 6/15 underwent PCI OM-1 with DES. Has been compliant with ASA and Plavix.   Today at 5:30pm while walking down the hall in his house developed central chest pressure with associated clamminess, diaphoresis, SOB, palpitations. Improved with home nitro but not resolved. Called EMS and got more nitro from EMS. ASA given by EMS. CP resolved upon arrival to ED. States this feels similar to prior MI "but worse." Has had increased edema and orthopnea over the past two days, usually 3 pillow orthopnea but has had to sleep in chair. Denies fevers, cough, recent illness, leg pain. Ran out of clonidine yesterday and BP has been high.    ECG: NSR with RBBB. New TWI v4-v6 Trop 0.08. BNP 229  He continues to smoke one half pack per day. Noncompliant with CPAP.   Cath 6/15 Left mainstem: Widely patent without obstruction. Left anterior descending (LAD): The LAD is patent throughout. There are diffuse luminal irregularities in the proximal vessel. The mid vessel has a widely patent stent. The diagonal branches are patent. The mid and distal LAD are patent with mild diffuse disease. The apical portion of the LAD has 75% stenosis.  Left circumflex (LCx):  There are diffuse irregularities. The first OM is stented. Before the stented segment there is an eccentric 80% hypodense stenosis present. The second OM has 60-70% stenosis that involves the origin of the distal AV circumflex which has tight 80-90% stenosis. There were no major vessels beyond that area of stenosis in the distal AV circumflex.  Right coronary artery (RCA): The RCA in its proximal aspect is large. It tapers in the mid vessel in its distal vessels are fairly small in caliber. The proximal vessel has some tortuosity. The mid vessel is extensively  stented. There is diffuse 70% in-stent restenosis present. The PDA is small.  PCI OM-1 with 2.75x15 mm Xience Alpine drug-eluting stent.    Review of Systems:     Cardiac Review of Systems: {Y] = yes [ ]  = no  Chest Pain [   y ]  Resting SOB [ y] Exertional SOB  [  ]  Orthopnea [ y ]   Pedal Edema [  y ]    Palpitations [  ] Syncope  [  ]   Presyncope [   ]  General Review of Systems: [Y] = yes [  ]=no Constitional: recent weight change [  ]; anorexia [  ]; fatigue [  ]; nausea [  ]; night sweats [  ]; fever [  ]; or chills [  ];                                                                      Dental: poor dentition[  ];   Eye : blurred vision [  ]; diplopia [   ]; vision changes [  ];  Amaurosis fugax[  ]; Resp: cough [  ];  wheezing[  ];  hemoptysis[  ]; shortness of breath[  y]; paroxysmal nocturnal dyspnea[  ]; dyspnea on exertion[  y]; or orthopnea[y  ];  GI:  gallstones[  ], vomiting[  ];  dysphagia[  ]; melena[  ];  hematochezia [  ]; heartburn[  ];   Hx of  Colonoscopy[  ]; GU: kidney stones [  ]; hematuria[  ];   dysuria [  ];  nocturia[  ];               Skin: rash [  ], swelling[  ];, hair loss[  ];  peripheral edema[ y ];  or itching[  ]; Musculosketetal: myalgias[  ];  joint swelling[  ];  joint erythema[  ];  joint pain[ y ];  back pain[ y ];  Heme/Lymph: bruising[  ];  bleeding[  ];  anemia[  ];  Neuro: TIA[  ];  headaches[  ];  stroke[  ];  vertigo[  ];  seizures[  ];   paresthesias[  ];  difficulty walking[  ];  Psych:depression[  ]; anxiety[  ];  Endocrine: diabetes[ y ];  thyroid dysfunction[  ];  Other:  Past Medical History  Diagnosis Date  . Dyslipidemia   . HTN (hypertension)   . CAD (coronary artery disease)     a. s/p PCI/BMS pRCA 2002; b. PCI pRCA & DES p/m RCA 2006; c. PCI/DES OM4 2007; d. PCI/CBA to RCA for ISR 10/2006; e.08/2012 STEMI/Cath/PCI: LM nl, LAD95-77m (3.0x20 Promus Prem DES), 95/95 ap LAD (1.0-1.23mm), LCX 30p 95d (sm), LPL1 70-80p, patent stent,  LPL2 nl, RCA 30p, 40/70 ISR, EF 35-40%.  . Ischemic cardiomyopathy     a. EF 40%; improved to normal;  b. 08/2012 Echo: EF 50-55%, mod LVH.  [name] GERD (gastroesophageal reflux disease)   . Tobacco abuse   . Obesity   . OSA (obstructive sleep apnea)     Does not use CPAP as of 05/2011  . Depression   . History of pneumonia   . Diabetes mellitus     a. A1c 8.8 08/2012->Metformin initiated. => b. A1c (9/14): 6.6  . Hyperlipidemia     Prior to Admission medications   Medication Sig Start Date End Date Taking? Authorizing Provider  amLODipine (NORVASC) 10 MG tablet Take 1 tablet (10 mg total) by mouth daily. 06/26/13  Yes Biagio Borg, MD  aspirin 81 MG tablet Take 81 mg by mouth daily.   Yes Historical Provider, MD  carvedilol (COREG) 12.5 MG tablet Take 1 tablet (12.5 mg total) by mouth 2 (two) times daily with a meal. 11/17/13  Yes Rhonda G Barrett, PA-C  cloNIDine (CATAPRES) 0.2 MG tablet Take 0.2 mg by mouth 2 (two) times daily.   Yes Historical Provider, MD  clopidogrel (PLAVIX) 75 MG tablet Take 1 tablet (75 mg total) by mouth every morning. 11/17/13  Yes Rhonda G Barrett, PA-C  escitalopram (LEXAPRO) 10 MG tablet Take 10 mg by mouth daily.   Yes Historical Provider, MD  famotidine (PEPCID) 40 MG tablet Take 40 mg by mouth 2 (two) times daily.   Yes Historical Provider, MD  furosemide (LASIX) 40 MG tablet Take 60 mg by mouth 2 (two) times daily.   Yes Historical Provider, MD  lidocaine-hydrocortisone (ANAMANTLE HC) 3-0.5 % CREA Place 1 Applicatorful rectally 2 (two) times daily. As needed 12/03/13  Yes Biagio Borg, MD  losartan (COZAAR) 100 MG tablet Take 100 mg by mouth daily.   Yes Historical Provider, MD  metFORMIN (GLUCOPHAGE) 500 MG tablet Take 1 tablet (500 mg total) by mouth daily with breakfast. HOLD for 48 hours, receiving on 06/11 11/17/13  Yes Evelene Croon  Barrett, PA-C  nitroGLYCERIN (NITROSTAT) 0.4 MG SL tablet Place 1 tablet (0.4 mg total) under the tongue every 5 (five) minutes as needed  for chest pain. For chest pain 11/17/13  Yes Rhonda G Barrett, PA-C  potassium chloride SA (K-DUR,KLOR-CON) 20 MEQ tablet Take 1 tablet (20 mEq total) by mouth 2 (two) times daily. 02/26/13  Yes Scott Joylene Draft, PA-C  pravastatin (PRAVACHOL) 80 MG tablet Take 1 tablet (80 mg total) by mouth every evening. 06/26/13  Yes Biagio Borg, MD      Allergies  Allergen Reactions  . Penicillins Other (See Comments)    Unknown childhood reaction    History   Social History  . Marital Status: Single    Spouse Name: N/A    Number of Children: 0  . Years of Education: N/A   Occupational History  . retired    Social History Main Topics  . Smoking status: Current Every Day Smoker -- 0.25 packs/day for 30 years    Types: Cigarettes  . Smokeless tobacco: Not on file     Comment: trying to cut down  . Alcohol Use: No  . Drug Use: No     Comment: remote marijuana use  . Sexual Activity: Not on file   Other Topics Concern  . Not on file   Social History Narrative   Lives alone.  He has no children.   He retired early due to health problems.          Family History  Problem Relation Age of Onset  . Coronary artery disease    . Depression Mother   . Cancer Mother     ovarian  . Hypertension Mother     Died, 54  . Other Father     motor vehicle accident    PHYSICAL EXAM: Filed Vitals:   03/12/14 1949  BP: 151/82  Pulse: 65  Resp: 15   General:  Well appearing. No respiratory difficulty HEENT: normal Neck: supple. JVP hard to see. Looks minimally elevated. Carotids 2+ bilat; no bruits. No lymphadenopathy or thryomegaly appreciated. Cor: PMI nondisplaced. Regular rate & rhythm. 2/6 TR Lungs: clear Abdomen: soft, obese nontender, nondistended. No hepatosplenomegaly. No bruits or masses. Good bowel sounds. Extremities: no cyanosis, clubbing, rash, trace edema Neuro: alert & oriented x 3, cranial nerves grossly intact. moves all 4 extremities w/o difficulty. Affect  pleasant.  ECG: per hpi   Results for orders placed during the hospital encounter of 03/12/14 (from the past 24 hour(s))  CBC     Status: None   Collection Time    03/12/14  7:11 PM      Result Value Ref Range   WBC 8.3  4.0 - 10.5 K/uL   RBC 5.65  4.22 - 5.81 MIL/uL   Hemoglobin 16.3  13.0 - 17.0 g/dL   HCT 47.8  39.0 - 52.0 %   MCV 84.6  78.0 - 100.0 fL   MCH 28.8  26.0 - 34.0 pg   MCHC 34.1  30.0 - 36.0 g/dL   RDW 15.4  11.5 - 15.5 %   Platelets 192  150 - 400 K/uL  BASIC METABOLIC PANEL     Status: Abnormal   Collection Time    03/12/14  7:11 PM      Result Value Ref Range   Sodium 145  137 - 147 mEq/L   Potassium 3.6 (*) 3.7 - 5.3 mEq/L   Chloride 104  96 - 112 mEq/L   CO2 26  19 - 32 mEq/L   Glucose, Bld 137 (*) 70 - 99 mg/dL   BUN 17  6 - 23 mg/dL   Creatinine, Ser 1.13  0.50 - 1.35 mg/dL   Calcium 9.9  8.4 - 10.5 mg/dL   GFR calc non Af Amer 70 (*) >90 mL/min   GFR calc Af Amer 82 (*) >90 mL/min   Anion gap 15  5 - 15  PRO B NATRIURETIC PEPTIDE     Status: Abnormal   Collection Time    03/12/14  7:11 PM      Result Value Ref Range   Pro B Natriuretic peptide (BNP) 229.0 (*) 0 - 125 pg/mL  I-STAT TROPOININ, ED     Status: None   Collection Time    03/12/14  7:28 PM      Result Value Ref Range   Troponin i, poc 0.08  0.00 - 0.08 ng/mL   Comment 3            Dg Chest Port 1 View  03/12/2014   CLINICAL DATA:  Chest pain; history of tobacco use and sleep apnea coronary artery disease  EXAM: PORTABLE CHEST - 1 VIEW  COMPARISON:  PA and lateral chest x-ray of November 13, 2013  FINDINGS: The lungs are adequately inflated. The interstitial markings are coarse though stable. The cardiopericardial silhouette is top-normal in size. The central pulmonary vascularity is mildly prominent. There is subsegmental atelectasis versus scarring at the right lung base. The bony thorax exhibits no acute abnormality.  IMPRESSION: There is no evidence of pneumonia nor definite evidence of  CHF. The cardiac silhouette remains top-normal in size.   Electronically Signed   By: David  Martinique   On: 03/12/2014 19:59     ASSESSMENT:  1. Unstable angina 2. CAD s/p multiple PCI 3. A/C diastolic HF 4. Tobacco use ongoing 5. DM2 6. HL 7. HTN, uncontrolled 8. Morbid obesity  PLAN/DISCUSSION:  CP concerning for [place] with new TWI on ECG. Admit. Cycle markers. Will treat with ASA, heparin, Plavix, b-blocker, statin. Plan cath Monday. Check P2Y12. Will resume clonidine for HTN. One dose lasix for mild volume overload. Hold metformin. Cover with SSI.   Narada Uzzle,MD 9:45 PM

## 2014-03-12 NOTE — ED Notes (Signed)
Spoke with admitting MD, informed of elevated troponin, please start heparin in ED before taking the pt upstairs. Pharmacy notified. Floor updated with plan of care.

## 2014-03-12 NOTE — ED Notes (Signed)
Cardiology at the bedside.

## 2014-03-12 NOTE — ED Provider Notes (Signed)
Muse not available ECG: Sinus rhythm, ventricular rate 70, normal axis, right bundle branch block, no definite acute ischemic changes noted, no significant change noted compared with prior ECG June2015  Medical screening examination/treatment/procedure(s) were performed by non-physician practitioner and as supervising physician I was immediately available for consultation/collaboration.   EKG Interpretation None       Babette Relic, MD 03/13/14 [phone]

## 2014-03-12 NOTE — ED Provider Notes (Signed)
CSN: [id number]     Arrival date & time 03/12/14  1846 History   First MD Initiated Contact with Patient 03/12/14 1850     Chief Complaint  Patient presents with  . Chest Pain     (Consider location/radiation/quality/duration/timing/severity/associated sxs/prior Treatment) The history is provided by the patient.    Pt with hx CAD s/p STEMI and NSTEMI, CHF, HTN, HL, DM, last cath 11/16/2013 with new stent placement.  Is on ASA and plavix.  Complaint with all medications except ran out of clonidine, last dose last night.  Today at 5:30pm while walking down the hall in his house developed central chest pressure with associated clamminess, diaphoresis, SOB, palpitations.  Improved with home nitro and nitro from EMS.  ASA given by EMS.  Resolved upon arrival to ED.  States this feels similar to prior MI "but worse."  Has had increased edema and orthopnea over the past two days, usually 3 pillow orthopnea but has had to sleep in chair.  Denies fevers, cough, recent illness, leg pain.   Pt remains pain free at time of interview.   PCP Cathlean Cower Cardiologist Dr [name]   Past Medical History  Diagnosis Date  . Dyslipidemia   . HTN (hypertension)   . CAD (coronary artery disease)     a. s/p PCI/BMS pRCA 2002; b. PCI pRCA & DES p/m RCA 2006; c. PCI/DES OM4 2007; d. PCI/CBA to RCA for ISR 10/2006; e.08/2012 STEMI/Cath/PCI: LM nl, LAD95-70m (3.0x20 Promus Prem DES), 95/95 ap LAD (1.0-1.11mm), LCX 30p 95d (sm), LPL1 70-80p, patent stent, LPL2 nl, RCA 30p, 40/70 ISR, EF 35-40%.  . Ischemic cardiomyopathy     a. EF 40%; improved to normal;  b. 08/2012 Echo: EF 50-55%, mod LVH.  [name] GERD (gastroesophageal reflux disease)   . Tobacco abuse   . Obesity   . OSA (obstructive sleep apnea)     Does not use CPAP as of 05/2011  . Depression   . History of pneumonia   . Diabetes mellitus     a. A1c 8.8 08/2012->Metformin initiated. => b. A1c (9/14): 6.6  . Hyperlipidemia    Past Surgical History  Procedure  Laterality Date  . Coronary stent placement  2009  . Coronary angioplasty with stent placement     Family History  Problem Relation Age of Onset  . Coronary artery disease    . Depression Mother   . Cancer Mother     ovarian  . Hypertension Mother     Died, 48  . Other Father     motor vehicle accident   History  Substance Use Topics  . Smoking status: Current Every Day Smoker -- 0.25 packs/day for 30 years    Types: Cigarettes  . Smokeless tobacco: Not on file     Comment: trying to cut down  . Alcohol Use: No    Review of Systems  All other systems reviewed and are negative.     Allergies  Penicillins  Home Medications   Prior to Admission medications   Medication Sig Start Date End Date Taking? Authorizing Provider  amLODipine (NORVASC) 10 MG tablet Take 1 tablet (10 mg total) by mouth daily. 06/26/13   Biagio Borg, MD  aspirin 81 MG tablet Take 81 mg by mouth daily.    Historical Provider, MD  carvedilol (COREG) 12.5 MG tablet Take 1 tablet (12.5 mg total) by mouth 2 (two) times daily with a meal. 11/17/13   Evelene Croon Barrett, PA-C  cloNIDine (CATAPRES) 0.2  MG tablet TAKE 1 TABLET BY MOUTH TWICE DAILY 03/12/14   Sherren Mocha, MD  clopidogrel (PLAVIX) 75 MG tablet Take 1 tablet (75 mg total) by mouth every morning. 11/17/13   Rhonda G Barrett, PA-C  Cyanocobalamin (VITAMIN B 12) 100 MCG LOZG Take 200 mcg by mouth daily.    Historical Provider, MD  escitalopram (LEXAPRO) 10 MG tablet Take 1 tablet (10 mg total) by mouth daily. 10/13/13 01/11/14  Biagio Borg, MD  furosemide (LASIX) 40 MG tablet Take 60 mg by mouth 2 (two) times daily.    Historical Provider, MD  glucose blood (ONE TOUCH ULTRA TEST) test strip Use as instructed 12/29/13   Biagio Borg, MD  hydrocortisone (ANUSOL-HC) 2.5 % rectal cream Apply rectally 2 times daily 12/01/13   Mirna Mires, MD  Lancets MISC 1 application by Does not apply route daily. 12/29/13   Biagio Borg, MD  lidocaine-hydrocortisone  (ANAMANTLE HC) 3-0.5 % CREA Place 1 Applicatorful rectally 2 (two) times daily. As needed 12/03/13   Biagio Borg, MD  losartan (COZAAR) 100 MG tablet TAKE 1 TABLET BY MOUTH EVERY DAY 03/01/14   Sherren Mocha, MD  metFORMIN (GLUCOPHAGE) 500 MG tablet Take 1 tablet (500 mg total) by mouth daily with breakfast. HOLD for 48 hours, receiving on 06/11 11/17/13   Evelene Croon Barrett, PA-C  mometasone-formoterol (DULERA) 100-5 MCG/ACT AERO Inhale 2 puffs into the lungs 2 (two) times daily. 10/13/13   Biagio Borg, MD  nitroGLYCERIN (NITROSTAT) 0.4 MG SL tablet Place 1 tablet (0.4 mg total) under the tongue every 5 (five) minutes as needed for chest pain. For chest pain 11/17/13   Evelene Croon Barrett, PA-C  potassium chloride SA (K-DUR,KLOR-CON) 20 MEQ tablet Take 1 tablet (20 mEq total) by mouth 2 (two) times daily. 02/26/13   Liliane Shi, PA-C  pravastatin (PRAVACHOL) 80 MG tablet Take 1 tablet (80 mg total) by mouth every evening. 06/26/13   Biagio Borg, MD   Ht 6\' 2"  (1.88 m)  Wt 330 lb (149.687 kg)  BMI 42.35 kg/m2  SpO2 99% Physical Exam  Nursing note and vitals reviewed. Constitutional: He appears well-developed and well-nourished. No distress.  HENT:  Head: Normocephalic and atraumatic.  Neck: Neck supple.  Cardiovascular: Normal rate, regular rhythm and intact distal pulses.   Pulmonary/Chest: Effort normal and breath sounds normal. No respiratory distress. He has no wheezes. He has no rales.  Abdominal: Soft. He exhibits no distension and no mass. There is no tenderness. There is no rebound and no guarding.  Musculoskeletal: He exhibits edema. He exhibits no tenderness.  Neurological: He is alert. He exhibits normal muscle tone.  Skin: He is not diaphoretic.  Psychiatric: He has a normal mood and affect. His behavior is normal.    ED Course  Procedures (including critical care time) Labs Review Labs Reviewed  BASIC METABOLIC PANEL - Abnormal; Notable for the following:    Potassium 3.6 (*)     Glucose, Bld 137 (*)    GFR calc non Af Amer 70 (*)    GFR calc Af Amer 82 (*)    All other components within normal limits  PRO B NATRIURETIC PEPTIDE - Abnormal; Notable for the following:    Pro B Natriuretic peptide (BNP) 229.0 (*)    All other components within normal limits  TROPONIN I - Abnormal; Notable for the following:    Troponin I 0.38 (*)    All other components within normal limits  CBC  PLATELET INHIBITION P2Y12  TROPONIN I  TROPONIN I  HEPARIN LEVEL (UNFRACTIONATED)  CBC  I-STAT TROPOININ, ED    Imaging Review Dg Chest Port 1 View  03/12/2014   CLINICAL DATA:  Chest pain; history of tobacco use and sleep apnea coronary artery disease  EXAM: PORTABLE CHEST - 1 VIEW  COMPARISON:  PA and lateral chest x-ray of November 13, 2013  FINDINGS: The lungs are adequately inflated. The interstitial markings are coarse though stable. The cardiopericardial silhouette is top-normal in size. The central pulmonary vascularity is mildly prominent. There is subsegmental atelectasis versus scarring at the right lung base. The bony thorax exhibits no acute abnormality.  IMPRESSION: There is no evidence of pneumonia nor definite evidence of CHF. The cardiac silhouette remains top-normal in size.   Electronically Signed   By: David  Martinique   On: 03/12/2014 19:59     EKG Interpretation None     Filed Vitals:   03/12/14 1949  BP: 151/82  Pulse: 65  Resp: 15    8:00 PM Spoke with cardiology who will see patient.  MDM   Final diagnoses:  Chest pain, unspecified chest pain type    Patient with hx CAD with STEMI and NSTEMI, multiple stents, presents with central chest pain with diaphoresis, SOB, palpitations at 5:30pm while walking, improved with nitro.  Pt stated this felt similar "but worse" than his prior MIs.  Admitted to cardiology.     Clayton Bibles, PA-C 03/13/14 0001

## 2014-03-13 ENCOUNTER — Encounter (HOSPITAL_COMMUNITY): Payer: Self-pay | Admitting: General Practice

## 2014-03-13 DIAGNOSIS — I214 Non-ST elevation (NSTEMI) myocardial infarction: Principal | ICD-10-CM

## 2014-03-13 DIAGNOSIS — I2510 Atherosclerotic heart disease of native coronary artery without angina pectoris: Secondary | ICD-10-CM

## 2014-03-13 DIAGNOSIS — I255 Ischemic cardiomyopathy: Secondary | ICD-10-CM

## 2014-03-13 LAB — LIPID PANEL
CHOL/HDL RATIO: 2.9 ratio
CHOLESTEROL: 129 mg/dL (ref 0–200)
HDL: 44 mg/dL (ref >39)
LDL Cholesterol: 67 mg/dL (ref 0–99)
Triglycerides: 88 mg/dL (ref <150)
VLDL: 18 mg/dL (ref 0–40)

## 2014-03-13 LAB — HEPARIN LEVEL (UNFRACTIONATED)
Heparin Unfractionated: 0.29 IU/mL — ABNORMAL LOW (ref 0.30–0.70)
Heparin Unfractionated: 0.51 IU/mL (ref 0.30–0.70)

## 2014-03-13 LAB — GLUCOSE, CAPILLARY
GLUCOSE-CAPILLARY: 104 mg/dL — AB (ref 70–99)
Glucose-Capillary: 122 mg/dL — ABNORMAL HIGH (ref 70–99)
Glucose-Capillary: 148 mg/dL — ABNORMAL HIGH (ref 70–99)
Glucose-Capillary: 164 mg/dL — ABNORMAL HIGH (ref 70–99)
Glucose-Capillary: 200 mg/dL — ABNORMAL HIGH (ref 70–99)

## 2014-03-13 LAB — CBC
HCT: 44.7 % (ref 39.0–52.0)
HEMOGLOBIN: 14.9 g/dL (ref 13.0–17.0)
MCH: 28.7 pg (ref 26.0–34.0)
MCHC: 33.3 g/dL (ref 30.0–36.0)
MCV: 86 fL (ref 78.0–100.0)
Platelets: 189 10*3/uL (ref 150–400)
RBC: 5.2 MIL/uL (ref 4.22–5.81)
RDW: 15.6 % — AB (ref 11.5–15.5)
WBC: 9.5 10*3/uL (ref 4.0–10.5)

## 2014-03-13 LAB — BASIC METABOLIC PANEL
Anion gap: 12 (ref 5–15)
BUN: 17 mg/dL (ref 6–23)
CALCIUM: 9.3 mg/dL (ref 8.4–10.5)
CO2: 27 mEq/L (ref 19–32)
Chloride: 107 mEq/L (ref 96–112)
Creatinine, Ser: 1.12 mg/dL (ref 0.50–1.35)
GFR, EST AFRICAN AMERICAN: 82 mL/min — AB (ref >90)
GFR, EST NON AFRICAN AMERICAN: 71 mL/min — AB (ref >90)
Glucose, Bld: 164 mg/dL — ABNORMAL HIGH (ref 70–99)
Potassium: 3.4 mEq/L — ABNORMAL LOW (ref 3.7–5.3)
Sodium: 146 mEq/L (ref 137–147)

## 2014-03-13 LAB — PLATELET INHIBITION P2Y12: PLATELET FUNCTION P2Y12: 195 [PRU] (ref 194–418)

## 2014-03-13 LAB — TROPONIN I
TROPONIN I: 0.87 ng/mL — AB (ref <0.30)
Troponin I: 0.51 ng/mL (ref <0.30)

## 2014-03-13 MED ORDER — AMLODIPINE BESYLATE 10 MG PO TABS
10.0000 mg | ORAL_TABLET | Freq: Every day | ORAL | Status: DC
  Administered 2014-03-13 – 2014-03-16 (×4): 10 mg via ORAL
  Filled 2014-03-13 (×4): qty 1

## 2014-03-13 MED ORDER — ONDANSETRON HCL 4 MG/2ML IJ SOLN: 4.0000 mg | Freq: Four times a day (QID) | INTRAMUSCULAR | Status: DC | PRN

## 2014-03-13 MED ORDER — ACETAMINOPHEN 325 MG PO TABS: 650.0000 mg | ORAL_TABLET | ORAL | Status: DC | PRN

## 2014-03-13 MED ORDER — INSULIN ASPART 100 UNIT/ML ~~LOC~~ SOLN
0.0000 [IU] | Freq: Three times a day (TID) | SUBCUTANEOUS | Status: DC
  Administered 2014-03-13: 2 [IU] via SUBCUTANEOUS
  Administered 2014-03-13 – 2014-03-14 (×2): 3 [IU] via SUBCUTANEOUS
  Administered 2014-03-14: 4 [IU] via SUBCUTANEOUS
  Administered 2014-03-16 (×2): 3 [IU] via SUBCUTANEOUS

## 2014-03-13 MED ORDER — NON FORMULARY: 80.0000 mg | Freq: Every day | Status: DC

## 2014-03-13 MED ORDER — NITROGLYCERIN 0.4 MG SL SUBL: 0.4000 mg | SUBLINGUAL_TABLET | SUBLINGUAL | Status: DC | PRN

## 2014-03-13 MED ORDER — FUROSEMIDE 40 MG PO TABS
60.0000 mg | ORAL_TABLET | Freq: Two times a day (BID) | ORAL | Status: DC
  Administered 2014-03-13 – 2014-03-16 (×8): 60 mg via ORAL
  Filled 2014-03-13 (×9): qty 1

## 2014-03-13 MED ORDER — INFLUENZA VAC SPLIT QUAD 0.5 ML IM SUSY
0.5000 mL | PREFILLED_SYRINGE | INTRAMUSCULAR | Status: DC
  Filled 2014-03-13: qty 0.5

## 2014-03-13 MED ORDER — CLOPIDOGREL BISULFATE 75 MG PO TABS
75.0000 mg | ORAL_TABLET | Freq: Every morning | ORAL | Status: DC
  Administered 2014-03-13 – 2014-03-16 (×4): 75 mg via ORAL
  Filled 2014-03-13 (×4): qty 1

## 2014-03-13 MED ORDER — INSULIN ASPART 100 UNIT/ML ~~LOC~~ SOLN: 0.0000 [IU] | Freq: Every day | SUBCUTANEOUS | Status: DC

## 2014-03-13 MED ORDER — ASPIRIN 81 MG PO CHEW: 324.0000 mg | CHEWABLE_TABLET | ORAL | Status: AC

## 2014-03-13 MED ORDER — ESCITALOPRAM OXALATE 10 MG PO TABS
10.0000 mg | ORAL_TABLET | Freq: Every day | ORAL | Status: DC
  Administered 2014-03-13 – 2014-03-16 (×4): 10 mg via ORAL
  Filled 2014-03-13 (×4): qty 1

## 2014-03-13 MED ORDER — LOSARTAN POTASSIUM 50 MG PO TABS
100.0000 mg | ORAL_TABLET | Freq: Every day | ORAL | Status: DC
  Administered 2014-03-13 – 2014-03-16 (×4): 100 mg via ORAL
  Filled 2014-03-13 (×4): qty 2

## 2014-03-13 MED ORDER — ASPIRIN 300 MG RE SUPP
300.0000 mg | RECTAL | Status: AC
  Filled 2014-03-13: qty 1

## 2014-03-13 MED ORDER — PRAVASTATIN SODIUM 40 MG PO TABS
80.0000 mg | ORAL_TABLET | Freq: Every day | ORAL | Status: DC
  Administered 2014-03-13 – 2014-03-16 (×4): 80 mg via ORAL
  Filled 2014-03-13 (×4): qty 2

## 2014-03-13 MED ORDER — POTASSIUM CHLORIDE CRYS ER 20 MEQ PO TBCR
20.0000 meq | EXTENDED_RELEASE_TABLET | Freq: Two times a day (BID) | ORAL | Status: DC
  Administered 2014-03-13 – 2014-03-15 (×6): 20 meq via ORAL
  Filled 2014-03-13 (×8): qty 1

## 2014-03-13 MED ORDER — FAMOTIDINE 40 MG PO TABS
40.0000 mg | ORAL_TABLET | Freq: Two times a day (BID) | ORAL | Status: DC
  Administered 2014-03-13 – 2014-03-16 (×7): 40 mg via ORAL
  Filled 2014-03-13 (×9): qty 1

## 2014-03-13 MED ORDER — INFLUENZA VAC SPLIT QUAD 0.5 ML IM SUSY: 0.5000 mL | PREFILLED_SYRINGE | INTRAMUSCULAR | Status: DC | PRN

## 2014-03-13 MED ORDER — ASPIRIN 81 MG PO CHEW
81.0000 mg | CHEWABLE_TABLET | Freq: Every day | ORAL | Status: DC
  Administered 2014-03-13 – 2014-03-16 (×4): 81 mg via ORAL
  Filled 2014-03-13 (×4): qty 1

## 2014-03-13 MED ORDER — CLONIDINE HCL 0.2 MG PO TABS
0.2000 mg | ORAL_TABLET | Freq: Two times a day (BID) | ORAL | Status: DC
  Administered 2014-03-13 – 2014-03-16 (×8): 0.2 mg via ORAL
  Filled 2014-03-13 (×10): qty 1

## 2014-03-13 MED ORDER — ASPIRIN 81 MG PO TABS
81.0000 mg | ORAL_TABLET | Freq: Every day | ORAL | Status: DC
Start: 2014-03-13 — End: 2014-03-13

## 2014-03-13 MED ORDER — CARVEDILOL 12.5 MG PO TABS
12.5000 mg | ORAL_TABLET | Freq: Two times a day (BID) | ORAL | Status: DC
  Administered 2014-03-13 – 2014-03-14 (×2): 12.5 mg via ORAL
  Filled 2014-03-13 (×5): qty 1

## 2014-03-13 NOTE — Significant Event (Signed)
[facility] [employer] message text sent for order request for nicotine patch.

## 2014-03-13 NOTE — Significant Event (Signed)
Patient acknowledged and signed form for receiving cash of the amount of $520.00 from the [employer] safe which was deposited per his request upon admission.  Signed form is in patient chart.  Patient then gave cash of $520.00 to friend to take home along with all his home medications which he brought to hospital.

## 2014-03-13 NOTE — Progress Notes (Signed)
Pt has refused use of cpap at this time.  Rt will continue to monitor.

## 2014-03-13 NOTE — Progress Notes (Signed)
[facility]arin Indication: chest pain/ACS  Allergies  Allergen Reactions  . Penicillins Other (See Comments)    Unknown childhood reaction    Labs:  Recent Labs  03/12/14 1911 03/12/14 2205 03/13/14 0334 03/13/14 1125  HGB 16.3  --  14.9  --   HCT 47.8  --  44.7  --   PLT 192  --  189  --   HEPARINUNFRC  --   --  0.29* 0.51  CREATININE 1.13  --  1.12  --   TROPONINI  --  0.38* 0.87* 0.51*    Estimated Creatinine Clearance: 114.1 ml/min (by C-G formula based on Cr of 1.12).   Assessment: 58 yo man continues on heparin for ACS Ruled in for NSTEMI Cath planned for Monday Heparin level therapeutic  Goal of Therapy:  Heparin level 0.3-0.7 units/ml Monitor platelets by anticoagulation protocol: Yes   Plan:  Continue heparin at 1850 units / hr Follow up AM labs  Thank you. Anette Guarneri, PharmD [phone]

## 2014-03-13 NOTE — Progress Notes (Addendum)
Dr. Debara Pickett made aware of lower BP and lower HR of 55 and orders received to hold Coreg.

## 2014-03-13 NOTE — Progress Notes (Signed)
Patient's last vitals: Temp-98.4*F, BP-115/66, HR-51, Resp-18, 02 sat-100% 2L [place].  MD aware of low HR. Coreg afternoon dose held. No complaints of pain. Will continue to monitor.

## 2014-03-13 NOTE — Progress Notes (Signed)
DAILY PROGRESS NOTE  Subjective:  Chest pain has resolved. Ruled-in for NSTEMI overnight. Tropoinin increased to 0.38 and 0.87.  Objective:  Temp:  [97.2 F (36.2 C)-97.9 F (36.6 C)] 97.8 F (36.6 C) (10/03 0509) Pulse Rate:  [61-79] 61 (10/03 0509) Resp:  [7-24] 18 (10/03 0509) BP: (120-175)/(72-96) 120/72 mmHg (10/03 0509) SpO2:  [92 %-99 %] 99 % (10/03 0509) Weight:  [330 lb (149.687 kg)-339 lb 3.2 oz (153.86 kg)] 339 lb 3.2 oz (153.86 kg) (10/03 0044) Weight change:   Intake/Output from previous day: 10/02 0701 - 10/03 0700 In: -  Out: 800 [Urine:800]  Intake/Output from this shift: Total I/O In: 240 [P.O.:240] Out: 350 [Urine:350]  Medications: Current Facility-Administered Medications  Medication Dose Route Frequency Provider Last Rate Last Dose  . acetaminophen (TYLENOL) tablet 650 mg  650 mg Oral Q4H PRN Jolaine Artist, MD      . amLODipine (NORVASC) tablet 10 mg  10 mg Oral Daily Jolaine Artist, MD      . aspirin chewable tablet 324 mg  324 mg Oral NOW Jolaine Artist, MD       Or  . aspirin suppository 300 mg  300 mg Rectal NOW Jolaine Artist, MD      . aspirin chewable tablet 81 mg  81 mg Oral Daily Shaune Pascal Bensimhon, MD      . carvedilol (COREG) tablet 12.5 mg  12.5 mg Oral BID WC Jolaine Artist, MD   12.5 mg at 03/13/14 0626  . cloNIDine (CATAPRES) tablet 0.2 mg  0.2 mg Oral BID Jolaine Artist, MD   0.2 mg at 03/13/14 0224  . clopidogrel (PLAVIX) tablet 75 mg  75 mg Oral q morning - 10a Jolaine Artist, MD      . escitalopram (LEXAPRO) tablet 10 mg  10 mg Oral Daily Jolaine Artist, MD      . famotidine (PEPCID) tablet 40 mg  40 mg Oral BID Jolaine Artist, MD      . furosemide (LASIX) tablet 60 mg  60 mg Oral BID Jolaine Artist, MD   60 mg at 03/13/14 0836  . heparin ADULT infusion 100 units/mL (25000 units/250 mL)  1,850 Units/hr Intravenous Continuous Jolaine Artist, MD 18.5 mL/hr at 03/13/14 0625 1,850  Units/hr at 03/13/14 6599  . Influenza vac split quadrivalent PF (FLUARIX) injection 0.5 mL  0.5 mL Intramuscular Tomorrow-1000 Jolaine Artist, MD      . insulin aspart (novoLOG) injection 0-20 Units  0-20 Units Subcutaneous TID WC Jolaine Artist, MD   2 Units at 03/13/14 0808  . insulin aspart (novoLOG) injection 0-5 Units  0-5 Units Subcutaneous QHS Jolaine Artist, MD      . losartan (COZAAR) tablet 100 mg  100 mg Oral Daily Jolaine Artist, MD      . nitroGLYCERIN (NITROSTAT) SL tablet 0.4 mg  0.4 mg Sublingual Q5 min PRN Jolaine Artist, MD      . ondansetron [facility]) injection 4 mg  4 mg Intravenous Q6H PRN Jolaine Artist, MD      . potassium chloride SA (K-DUR,KLOR-CON) CR tablet 20 mEq  20 mEq Oral BID Jolaine Artist, MD   20 mEq at 03/13/14 0224  . pravastatin (PRAVACHOL) tablet 80 mg  80 mg Oral Daily Jolaine Artist, MD        Physical Exam: General appearance: alert, no distress and morbidly obese Neck: no carotid bruit and no JVD  Lungs: clear to auscultation bilaterally Heart: regular rate and rhythm, S1, S2 normal, no murmur, click, rub or gallop Abdomen: soft, non-tender; bowel sounds normal; no masses,  no organomegaly Extremities: extremities normal, atraumatic, no cyanosis or edema Pulses: 2+ and symmetric Skin: Skin color, texture, turgor normal. No rashes or lesions Neurologic: Grossly normal Psych: Pleasant  Lab Results: Results for orders placed during the hospital encounter of 03/12/14 (from the past 48 hour(s))  CBC     Status: None   Collection Time    03/12/14  7:11 PM      Result Value Ref Range   WBC 8.3  4.0 - 10.5 K/uL   RBC 5.65  4.22 - 5.81 MIL/uL   Hemoglobin 16.3  13.0 - 17.0 g/dL   HCT 47.8  39.0 - 52.0 %   MCV 84.6  78.0 - 100.0 fL   MCH 28.8  26.0 - 34.0 pg   MCHC 34.1  30.0 - 36.0 g/dL   RDW 15.4  11.5 - 15.5 %   Platelets 192  150 - 400 K/uL  BASIC METABOLIC PANEL     Status: Abnormal   Collection Time     03/12/14  7:11 PM      Result Value Ref Range   Sodium 145  137 - 147 mEq/L   Potassium 3.6 (*) 3.7 - 5.3 mEq/L   Chloride 104  96 - 112 mEq/L   CO2 26  19 - 32 mEq/L   Glucose, Bld 137 (*) 70 - 99 mg/dL   BUN 17  6 - 23 mg/dL   Creatinine, Ser 1.13  0.50 - 1.35 mg/dL   Calcium 9.9  8.4 - 10.5 mg/dL   GFR calc non Af Amer 70 (*) >90 mL/min   GFR calc Af Amer 82 (*) >90 mL/min   Comment: (NOTE)     The eGFR has been calculated using the CKD EPI equation.     This calculation has not been validated in all clinical situations.     eGFR's persistently <90 mL/min signify possible Chronic Kidney     Disease.   Anion gap 15  5 - 15  PRO B NATRIURETIC PEPTIDE     Status: Abnormal   Collection Time    03/12/14  7:11 PM      Result Value Ref Range   Pro B Natriuretic peptide (BNP) 229.0 (*) 0 - 125 pg/mL  I-STAT TROPOININ, ED     Status: None   Collection Time    03/12/14  7:28 PM      Result Value Ref Range   Troponin i, poc 0.08  0.00 - 0.08 ng/mL   Comment 3            Comment: Due to the release kinetics of cTnI,     a negative result within the first hours     of the onset of symptoms does not rule out     myocardial infarction with certainty.     If myocardial infarction is still suspected,     repeat the test at appropriate intervals.  TROPONIN I     Status: Abnormal   Collection Time    03/12/14 10:05 PM      Result Value Ref Range   Troponin I 0.38 (*) <0.30 ng/mL   Comment:            Due to the release kinetics of cTnI,     a negative result within the first hours  of the onset of symptoms does not rule out     myocardial infarction with certainty.     If myocardial infarction is still suspected,     repeat the test at appropriate intervals.     CRITICAL RESULT CALLED TO, READ BACK BY AND VERIFIED WITH:     LAMBERT B,RN 03/12/14 2252 WAYK  GLUCOSE, CAPILLARY     Status: Abnormal   Collection Time    03/13/14 12:49 AM      Result Value Ref Range    Glucose-Capillary 200 (*) 70 - 99 mg/dL  PLATELET INHIBITION P2Y12     Status: None   Collection Time    03/13/14  3:34 AM      Result Value Ref Range   Platelet Function  P2Y12 195  194 - 418 PRU   Comment:            The literature has shown a direct     correlation of PRU values over     230 with higher risks of     thrombotic events.  Lower PRU     values are associated with     platelet inhibition.  HEPARIN LEVEL (UNFRACTIONATED)     Status: Abnormal   Collection Time    03/13/14  3:34 AM      Result Value Ref Range   Heparin Unfractionated 0.29 (*) 0.30 - 0.70 IU/mL   Comment:            IF HEPARIN RESULTS ARE BELOW     EXPECTED VALUES, AND PATIENT     DOSAGE HAS BEEN CONFIRMED,     SUGGEST FOLLOW UP TESTING     OF ANTITHROMBIN III LEVELS.  CBC     Status: Abnormal   Collection Time    03/13/14  3:34 AM      Result Value Ref Range   WBC 9.5  4.0 - 10.5 K/uL   RBC 5.20  4.22 - 5.81 MIL/uL   Hemoglobin 14.9  13.0 - 17.0 g/dL   HCT 44.7  39.0 - 52.0 %   MCV 86.0  78.0 - 100.0 fL   MCH 28.7  26.0 - 34.0 pg   MCHC 33.3  30.0 - 36.0 g/dL   RDW 15.6 (*) 11.5 - 15.5 %   Platelets 189  150 - 400 K/uL  LIPID PANEL     Status: None   Collection Time    03/13/14  3:34 AM      Result Value Ref Range   Cholesterol 129  0 - 200 mg/dL   Triglycerides 88  <150 mg/dL   HDL 44  >39 mg/dL   Total CHOL/HDL Ratio 2.9     VLDL 18  0 - 40 mg/dL   LDL Cholesterol 67  0 - 99 mg/dL   Comment:            Total Cholesterol/HDL:CHD Risk     Coronary Heart Disease Risk Table                         Men   Women      1/2 Average Risk   3.4   3.3      Average Risk       5.0   4.4      2 X Average Risk   9.6   7.1      3 X Average Risk  23.4   11.0  Use the calculated Patient Ratio     above and the CHD Risk Table     to determine the patient's CHD Risk.                ATP III CLASSIFICATION (LDL):      <100     mg/dL   Optimal      100-129  mg/dL   Near or Above                         Optimal      130-159  mg/dL   Borderline      160-189  mg/dL   High      >190     mg/dL   Very High  BASIC METABOLIC PANEL     Status: Abnormal   Collection Time    03/13/14  3:34 AM      Result Value Ref Range   Sodium 146  137 - 147 mEq/L   Potassium 3.4 (*) 3.7 - 5.3 mEq/L   Chloride 107  96 - 112 mEq/L   CO2 27  19 - 32 mEq/L   Glucose, Bld 164 (*) 70 - 99 mg/dL   BUN 17  6 - 23 mg/dL   Creatinine, Ser 1.12  0.50 - 1.35 mg/dL   Calcium 9.3  8.4 - 10.5 mg/dL   GFR calc non Af Amer 71 (*) >90 mL/min   GFR calc Af Amer 82 (*) >90 mL/min   Comment: (NOTE)     The eGFR has been calculated using the CKD EPI equation.     This calculation has not been validated in all clinical situations.     eGFR's persistently <90 mL/min signify possible Chronic Kidney     Disease.   Anion gap 12  5 - 15  TROPONIN I     Status: Abnormal   Collection Time    03/13/14  3:34 AM      Result Value Ref Range   Troponin I 0.87 (*) <0.30 ng/mL   Comment:            Due to the release kinetics of cTnI,     a negative result within the first hours     of the onset of symptoms does not rule out     myocardial infarction with certainty.     If myocardial infarction is still suspected,     repeat the test at appropriate intervals.     CRITICAL VALUE NOTED.  VALUE IS CONSISTENT WITH PREVIOUSLY REPORTED AND CALLED VALUE.    Imaging: Dg Chest Port 1 View  03/12/2014   CLINICAL DATA:  Chest pain; history of tobacco use and sleep apnea coronary artery disease  EXAM: PORTABLE CHEST - 1 VIEW  COMPARISON:  PA and lateral chest x-ray of November 13, 2013  FINDINGS: The lungs are adequately inflated. The interstitial markings are coarse though stable. The cardiopericardial silhouette is top-normal in size. The central pulmonary vascularity is mildly prominent. There is subsegmental atelectasis versus scarring at the right lung base. The bony thorax exhibits no acute abnormality.  IMPRESSION: There is no  evidence of pneumonia nor definite evidence of CHF. The cardiac silhouette remains top-normal in size.   Electronically Signed   By: David  Martinique   On: 03/12/2014 19:59    Assessment:  Principal Problem:   NSTEMI (non-ST elevated myocardial infarction) Active Problems:   Essential hypertension, benign   Ischemic cardiomyopathy   CAD (  coronary artery disease)   Plan:  1. Ruled in for NSTEMI with typical rise and fall of troponin overnight. BNP is low and no S/S of CHF on exam. I suspect this is in fact obstructive CAD. He is stable on heparin at this point. Will proceed with plans for cardiac catheterization on Monday.  Time Spent Directly with Patient:  15 minutes  Length of Stay:  LOS: 1 day   Pixie Casino, MD, [facility] Attending Cardiologist [facility]  HILTY,Kenneth C 03/13/2014, 9:14 AM

## 2014-03-13 NOTE — Progress Notes (Signed)
Coreg held this afternoon per MD order.

## 2014-03-14 LAB — BASIC METABOLIC PANEL
Anion gap: 10 (ref 5–15)
BUN: 17 mg/dL (ref 6–23)
CHLORIDE: 105 meq/L (ref 96–112)
CO2: 25 meq/L (ref 19–32)
Calcium: 9 mg/dL (ref 8.4–10.5)
Creatinine, Ser: 1.02 mg/dL (ref 0.50–1.35)
GFR calc Af Amer: >90 mL/min (ref >90)
GFR calc non Af Amer: 80 mL/min — ABNORMAL LOW (ref >90)
GLUCOSE: 139 mg/dL — AB (ref 70–99)
Potassium: 4 mEq/L (ref 3.7–5.3)
SODIUM: 140 meq/L (ref 137–147)

## 2014-03-14 LAB — CBC
HEMATOCRIT: 42.8 % (ref 39.0–52.0)
HEMOGLOBIN: 14.1 g/dL (ref 13.0–17.0)
MCH: 28.4 pg (ref 26.0–34.0)
MCHC: 32.9 g/dL (ref 30.0–36.0)
MCV: 86.1 fL (ref 78.0–100.0)
Platelets: 169 10*3/uL (ref 150–400)
RBC: 4.97 MIL/uL (ref 4.22–5.81)
RDW: 15.9 % — ABNORMAL HIGH (ref 11.5–15.5)
WBC: 7.3 10*3/uL (ref 4.0–10.5)

## 2014-03-14 LAB — GLUCOSE, CAPILLARY
GLUCOSE-CAPILLARY: 120 mg/dL — AB (ref 70–99)
Glucose-Capillary: 130 mg/dL — ABNORMAL HIGH (ref 70–99)
Glucose-Capillary: 176 mg/dL — ABNORMAL HIGH (ref 70–99)
Glucose-Capillary: 96 mg/dL (ref 70–99)

## 2014-03-14 LAB — HEPARIN LEVEL (UNFRACTIONATED): HEPARIN UNFRACTIONATED: 0.72 [IU]/mL — AB (ref 0.30–0.70)

## 2014-03-14 MED ORDER — CARVEDILOL 3.125 MG PO TABS
3.1250 mg | ORAL_TABLET | Freq: Two times a day (BID) | ORAL | Status: DC
  Administered 2014-03-14 – 2014-03-16 (×5): 3.125 mg via ORAL
  Filled 2014-03-14 (×7): qty 1

## 2014-03-14 MED ORDER — NITROGLYCERIN 2 % TD OINT
0.5000 [in_us] | TOPICAL_OINTMENT | Freq: Four times a day (QID) | TRANSDERMAL | Status: DC
  Administered 2014-03-14 – 2014-03-15 (×4): 0.5 [in_us] via TOPICAL
  Filled 2014-03-14: qty 30

## 2014-03-14 NOTE — Progress Notes (Signed)
Utilization Review Completed.   Gaylon Bentz, RN, BSN Nurse Case Manager  

## 2014-03-14 NOTE — Progress Notes (Signed)
Patient is sleeping peacefully. No c/o pain/discomfort offered. Maintained on telemetry and is sinus brady with a heart rate of 56.  Also maintained on 2 liters of oxygen via nasal cannula because patient' destats to the low 80s on room air. Will continue to monitor.  Esperanza Heir, RN

## 2014-03-14 NOTE — Progress Notes (Signed)
    Subjective:  No chest pain but complains of dyspnea  Objective:  Vital Signs in the last 24 hours: Temp:  [97.6 F (36.4 C)-98.4 F (36.9 C)] 97.6 F (36.4 C) (10/04 0509) Pulse Rate:  [50-59] 50 (10/04 0509) Resp:  [16-18] 18 (10/04 0509) BP: (111-125)/(64-72) 125/69 mmHg (10/04 0509) SpO2:  [95 %-100 %] 100 % (10/04 0509) Weight:  [339 lb 3.2 oz (153.86 kg)] 339 lb 3.2 oz (153.86 kg) (10/04 0509)  Intake/Output from previous day:  Intake/Output Summary (Last 24 hours) at 03/14/14 0943 Last data filed at 03/14/14 0818  Gross per 24 hour  Intake   1140 ml  Output   1475 ml  Net   -335 ml    Physical Exam: General appearance: alert, cooperative, no distress and morbidly obese Lungs: clear to auscultation bilaterally Heart: regular rate and rhythm Extremities: no edema   Rate: 45-55  Rhythm: normal sinus rhythm and sinus bradycardia  Lab Results:  Recent Labs  03/13/14 0334 03/14/14 0406  WBC 9.5 7.3  HGB 14.9 14.1  PLT 189 169    Recent Labs  03/13/14 0334 03/14/14 0406  NA 146 140  K 3.4* 4.0  CL 107 105  CO2 27 25  GLUCOSE 164* 139*  BUN 17 17  CREATININE 1.12 1.02    Recent Labs  03/13/14 0334 03/13/14 1125  TROPONINI 0.87* 0.51*   No results found for this basename: INR,  in the last 72 hours  Imaging: Imaging results have been reviewed  Cardiac Studies:  Assessment/Plan:  58 year old morbidly obese smoker with DM2, HTN, HL, diastolic HF (EF 26-94%), extensive history of CAD with multiple PCI procedures. Last cath in 6/15 underwent PCI OM-1 with DES.  Patient resented to ER 03/12/14 with CP. Has been compliant with ASA and Plavix. He has ruled in for a NSTEMI.     Principal Problem:   Unstable angina Active Problems:   NSTEMI (non-ST elevated myocardial infarction)   CAD- multiple PCIs, last 6/15- OM DES   Ischemic cardiomyopathy- EF 50-55% June 2015   Obesity, Class III, BMI 40-49.9 (morbid obesity)   DM2 (diabetes mellitus,  type 2)   Obstructive sleep apnea   Essential hypertension, benign    PLAN: No CHF on exam, or CXR.  BNP 229. Cath in am. Hold Lasix in am.   Kerin Ransom PA-C Beeper [phone] 03/14/2014, 9:43 AM

## 2014-03-14 NOTE — Progress Notes (Signed)
[place] for Heparin Indication: chest pain/ACS  Allergies  Allergen Reactions  . Penicillins Other (See Comments)    Unknown childhood reaction    Labs:  Recent Labs  03/12/14 1911 03/12/14 2205 03/13/14 0334 03/13/14 1125 03/14/14 0406  HGB 16.3  --  14.9  --  14.1  HCT 47.8  --  44.7  --  42.8  PLT 192  --  189  --  169  HEPARINUNFRC  --   --  0.29* 0.51 0.72*  CREATININE 1.13  --  1.12  --  1.02  TROPONINI  --  0.38* 0.87* 0.51*  --     Estimated Creatinine Clearance: 125.3 ml/min (by C-G formula based on Cr of 1.02).   Assessment: 58 yo man continues on heparin for ACS Ruled in for NSTEMI Cath planned for Monday Heparin level slightly high  Goal of Therapy:  Heparin level 0.3-0.7 units/ml Monitor platelets by anticoagulation protocol: Yes   Plan:  Decrease heparin to 1750 units / hr Follow up AM labs  Thank you. Anette Guarneri, PharmD [phone]

## 2014-03-14 NOTE — Progress Notes (Signed)
Patient consent signed for procedure tomorrow and in chart. No complaints of pain. Will continue to monitor.

## 2014-03-14 NOTE — Progress Notes (Addendum)
Pt. Seen and examined. Agree with the NP/PA-C note as written.  Plan for [facility] tomorrow for NSTEMI. Has had some bradycardia and b-blocker being held. ?RCA lesion. Add low dose nitropaste to help promote coronary blood flow. Hydrate overnight.  Pixie Casino, MD, [facility] Attending Cardiologist [place]

## 2014-03-14 NOTE — Progress Notes (Signed)
Respiratory paged to place patient on CPAP as he continues to destat to the low 80s.Once placed on CPAP patient's pulse oxygenation increased to 99 %. Will continue to monitor.  Esperanza Heir, RN

## 2014-03-15 ENCOUNTER — Encounter (HOSPITAL_COMMUNITY)
Admission: EM | Disposition: A | Discharge status: Home / Self Care | Payer: Self-pay | Source: Home / Self Care | Attending: Internal Medicine

## 2014-03-15 ENCOUNTER — Ambulatory Visit (HOSPITAL_COMMUNITY): Payer: Medicare HMO

## 2014-03-15 DIAGNOSIS — E119 Type 2 diabetes mellitus without complications: Secondary | ICD-10-CM

## 2014-03-15 DIAGNOSIS — R001 Bradycardia, unspecified: Secondary | ICD-10-CM

## 2014-03-15 DIAGNOSIS — I2510 Atherosclerotic heart disease of native coronary artery without angina pectoris: Secondary | ICD-10-CM

## 2014-03-15 HISTORY — PX: LEFT HEART CATHETERIZATION WITH CORONARY ANGIOGRAM: SHX5451

## 2014-03-15 LAB — CBC
HEMATOCRIT: 42.2 % (ref 39.0–52.0)
Hemoglobin: 13.8 g/dL (ref 13.0–17.0)
MCH: 28.2 pg (ref 26.0–34.0)
MCHC: 32.7 g/dL (ref 30.0–36.0)
MCV: 86.3 fL (ref 78.0–100.0)
Platelets: 165 10*3/uL (ref 150–400)
RBC: 4.89 MIL/uL (ref 4.22–5.81)
RDW: 15.8 % — ABNORMAL HIGH (ref 11.5–15.5)
WBC: 6.8 10*3/uL (ref 4.0–10.5)

## 2014-03-15 LAB — GLUCOSE, CAPILLARY
GLUCOSE-CAPILLARY: 116 mg/dL — AB (ref 70–99)
Glucose-Capillary: 90 mg/dL (ref 70–99)
Glucose-Capillary: 76 mg/dL (ref 70–99)
Glucose-Capillary: 75 mg/dL (ref 70–99)
Glucose-Capillary: 110 mg/dL — ABNORMAL HIGH (ref 70–99)
Glucose-Capillary: 153 mg/dL — ABNORMAL HIGH (ref 70–99)

## 2014-03-15 LAB — HEPARIN LEVEL (UNFRACTIONATED): Heparin Unfractionated: 0.49 IU/mL (ref 0.30–0.70)

## 2014-03-15 LAB — POCT ACTIVATED CLOTTING TIME: Activated Clotting Time: 101 seconds

## 2014-03-15 LAB — PROTIME-INR
INR: 0.93 (ref 0.00–1.49)
Prothrombin Time: 12.5 seconds (ref 11.6–15.2)

## 2014-03-15 SURGERY — LEFT HEART CATHETERIZATION WITH CORONARY ANGIOGRAM
Anesthesia: LOCAL

## 2014-03-15 MED ORDER — LIDOCAINE HCL (PF) 1 % IJ SOLN
INTRAMUSCULAR | Status: AC
  Filled 2014-03-15: qty 30

## 2014-03-15 MED ORDER — SODIUM CHLORIDE 0.9 % IV SOLN: 1.0000 mL/kg/h | INTRAVENOUS | Status: DC

## 2014-03-15 MED ORDER — NITROGLYCERIN 1 MG/10 ML FOR IR/CATH LAB
INTRA_ARTERIAL | Status: AC
  Filled 2014-03-15: qty 10

## 2014-03-15 MED ORDER — MIDAZOLAM HCL 2 MG/2ML IJ SOLN
INTRAMUSCULAR | Status: AC
  Filled 2014-03-15: qty 2

## 2014-03-15 MED ORDER — FENTANYL CITRATE 0.05 MG/ML IJ SOLN
INTRAMUSCULAR | Status: AC
  Filled 2014-03-15: qty 2

## 2014-03-15 MED ORDER — HEPARIN (PORCINE) IN NACL 2-0.9 UNIT/ML-% IJ SOLN
INTRAMUSCULAR | Status: AC
  Filled 2014-03-15: qty 1500

## 2014-03-15 MED ORDER — SODIUM CHLORIDE 0.9 % IV SOLN
INTRAVENOUS | Status: AC
  Administered 2014-03-15: 19:00:00 via INTRAVENOUS

## 2014-03-15 MED ORDER — SODIUM CHLORIDE 0.9 % IV SOLN
INTRAVENOUS | Status: DC
  Administered 2014-03-15: 01:00:00 via INTRAVENOUS

## 2014-03-15 NOTE — CV Procedure (Signed)
   Cardiac Catheterization Procedure Note  Name: [name] [name] MRN: [id number] DOB: March 06, 1956  Procedure: Left Heart Cath, Selective Coronary Angiography, LV angiography  Indication: 58 yo BM with history of multiple prior coronary interventions presents with a NSTEMI.   Procedural details: Due to difficulty with radial access on prior procedure femoral access was used.The right groin was prepped, draped, and anesthetized with 1% lidocaine. Using modified Seldinger technique, a 5 French sheath was introduced into the right femoral artery. Standard Judkins catheters were used for coronary angiography and left ventriculography. Catheter exchanges were performed over a guidewire. There were no immediate procedural complications. The patient was transferred to the post catheterization recovery area for further monitoring.  Procedural Findings: Hemodynamics:  AO 151/85 mean 114 mm Hg LV 150/24 mm Hg   Coronary angiography: Coronary dominance: right  Left mainstem: Normal.  Left anterior descending (LAD): The proximal LAD is ectactic. There is a stent in the mid vessel that is widely patent. The very distal LAD is diffusely diseased up to 70% at the apex.   Left circumflex (LCx): The LCx has diffuse ecstasia in the proximal and mid vessel. The first OM is small and chronically occluded. The second OM is widely patent at prior stent sites. There is severe disease in the distal OM2 up to 90%.  OM 3 has mild disease to 30-40%. The distal LCx is a very small branch with 90% stenosis.  Right coronary artery (RCA): The RCA has a long stented segment in the proximal to mid vessel. There is diffuse in stent disease in the mid RCA up to 60-70%.   Left ventriculography: Left ventricular systolic function is abnormal, there is inferobasal akinesis. LVEF is estimated at 50%, there is no significant mitral regurgitation   Final Conclusions:   1. Diffuse CAD. There is severe disease in the small distal  LCx and OM2. There is moderate disease in the mid RCA and distal LAD. This has not changed significantly since his prior study. 2. Mild LV dysfunction.  Recommendations: Continue medical therapy with risk factor modification.  Alben Jepsen [place], [place] 03/15/2014, 4:23 PM

## 2014-03-15 NOTE — Progress Notes (Signed)
Patient Name: [name] [name] Date of Encounter: 03/15/2014     Principal Problem:   Unstable angina Active Problems:   Obstructive sleep apnea   Essential hypertension, benign   Ischemic cardiomyopathy- EF 50-55% June 2015   Obesity, Class III, BMI 40-49.9 (morbid obesity)   DM2 (diabetes mellitus, type 2)   NSTEMI (non-ST elevated myocardial infarction)   CAD- multiple PCIs, last 6/15- OM DES    SUBJECTIVE  A little SOB but states he can lie flat. No Cp  CURRENT MEDS . amLODipine  10 mg Oral Daily  . aspirin  81 mg Oral Daily  . carvedilol  3.125 mg Oral BID WC  . cloNIDine  0.2 mg Oral BID  . clopidogrel  75 mg Oral q morning - 10a  . escitalopram  10 mg Oral Daily  . famotidine  40 mg Oral BID  . furosemide  60 mg Oral BID  . insulin aspart  0-20 Units Subcutaneous TID WC  . insulin aspart  0-5 Units Subcutaneous QHS  . losartan  100 mg Oral Daily  . nitroGLYCERIN  0.5 inch Topical 4 times per day  . potassium chloride SA  20 mEq Oral BID  . pravastatin  80 mg Oral Daily    OBJECTIVE  Filed Vitals:   03/14/14 1401 03/14/14 2042 03/15/14 0524 03/15/14 0943  BP: 130/68 140/67 144/83 152/74  Pulse: 58 58 56 83  Temp: 97.7 F (36.5 C) 97.8 F (36.6 C) 97.7 F (36.5 C)   TempSrc: Oral Oral Oral   Resp: 20 18 19 18   Height:      Weight:   336 lb 13.8 oz (152.8 kg)   SpO2: 94% 96% 95% 100%    Intake/Output Summary (Last 24 hours) at 03/15/14 1039 Last data filed at 03/15/14 0900  Gross per 24 hour  Intake 793.83 ml  Output   2375 ml  Net -1581.17 ml   Filed Weights   03/13/14 0044 03/14/14 0509 03/15/14 0524  Weight: 339 lb 3.2 oz (153.86 kg) 339 lb 3.2 oz (153.86 kg) 336 lb 13.8 oz (152.8 kg)    PHYSICAL EXAM  General appearance: alert, cooperative, no distress and morbidly obese  Lungs: clear to auscultation bilaterally  Heart: regular rate and rhythm  Extremities: no edema   Accessory Clinical Findings  CBC  Recent Labs  03/14/14 0406  03/15/14 0500  WBC 7.3 6.8  HGB 14.1 13.8  HCT 42.8 42.2  MCV 86.1 86.3  PLT 169 734   Basic Metabolic Panel  Recent Labs  03/13/14 0334 03/14/14 0406  NA 146 140  K 3.4* 4.0  CL 107 105  CO2 27 25  GLUCOSE 164* 139*  BUN 17 17  CREATININE 1.12 1.02  CALCIUM 9.3 9.0   Cardiac Enzymes  Recent Labs  03/12/14 2205 03/13/14 0334 03/13/14 1125  TROPONINI 0.38* 0.87* 0.51*   Fasting Lipid Panel  Recent Labs  03/13/14 0334  CHOL 129  HDL 44  LDLCALC 67  TRIG 88  CHOLHDL 2.9     TELE  Sinus brady.   Radiology/Studies  Dg Chest Port 1 View  03/12/2014   CLINICAL DATA:  Chest pain; history of tobacco use and sleep apnea coronary artery disease  EXAM: PORTABLE CHEST - 1 VIEW  COMPARISON:  PA and lateral chest x-ray of November 13, 2013  FINDINGS: The lungs are adequately inflated. The interstitial markings are coarse though stable. The cardiopericardial silhouette is top-normal in size. The central pulmonary vascularity is mildly prominent.  There is subsegmental atelectasis versus scarring at the right lung base. The bony thorax exhibits no acute abnormality.  IMPRESSION: There is no evidence of pneumonia nor definite evidence of CHF. The cardiac silhouette remains top-normal in size.   Electronically Signed   By: David  Martinique   On: 03/12/2014 19:59    ASSESSMENT AND PLAN 58 year old morbidly obese smoker with DM2, HTN, HL, diastolic HF (EF 02-77%), extensive history of CAD with multiple PCI procedures. Last cath in 6/15 underwent PCI OM-1 with DES. Patient resented to ER 03/12/14 with CP. Has been compliant with ASA and Plavix. He has ruled in for a NSTEMI.   NSTEMI/CAD - peak troponin 0.87. planned for LHC today at 12 -- Hx of multiple PCIs, last 6/15- OM DES  -- Cont ASA/plavix, carvedilol 3.125 BID, pravastatin 80mg   Ischemic cardiomyopathy- EF 50-55% June 2015  -- Continue BB and ARB -- Continue Lasix 60 mg BID. Net neg 3.4 L. Weight 339--> 336 lbs.  Obesity,  Class III, BMI 40-49.9 (morbid obesity)   DM2 (diabetes mellitus, type 2)   Obstructive sleep apnea - on CPAP but would like to switch to nasal cannula  Bradycardia- BB being was held. But now on a low dose of Coreg 3.125 mg BID. HRs stable in the 50s.  HTN- continue clonidine 0.2mg  BID, losartan 100mg , coreg 3.125 mg BID  Judy Pimple PA-C  Pager [phone]  Patient seen, examined. Available data reviewed. Agree with findings, assessment, and plan as outlined by Lorretta Harp, PA-C. Exam reveals an alert, oriented, pleasant obese male in no distress. Lungs are clear. Heart is regular rate and rhythm. There is no peripheral edema. All data was reviewed. The patient is well-known to me. His last PCI procedure was in June 2015 when he was treated with a drug-eluting stent in the obtuse marginal branch of the circumflex. He had residual disease noted in the distal AV circumflex and diffuse disease in the RCA. Medical therapy was recommended. He presents again with non-ST elevation infarction. He notes that he ran out of clonidine a few days prior to admission. He otherwise has been doing well. Plans reviewed for cardiac catheterization and possible PCI today.  I have reviewed the risks, indications, and alternatives to cardiac catheterization were reviewed with the patient. Risks include but are not limited to bleeding, infection, vascular injury, stroke, myocardial infection, arrhythmia, emergency cardiac surgery, and death. The patient understands the risks of serious complication is low (<7%).   Note that the patient's last procedure from the right radial was technically challenging because of marked subclavian tortuosity requiring use of guide catheters and stiff guidewires. May consider alternative site for access but will leave to discretion of the interventional cardiologist.  Sherren Mocha, M.D. 03/15/2014 12:11 PM

## 2014-03-15 NOTE — Progress Notes (Signed)
Site area: right groin  Site Prior to Removal:  Level 0 Pressure Applied For: 20 minutes Manual:   yes Patient Status During Pull:  stable Post Pull Site:  Level 0 Post Pull Instructions Given:  yes Post Pull Pulses Present: yes and remained palpable during sheath pull Dressing Applied:  tegaderm Bedrest begins @ 1700 Comments: No complications

## 2014-03-15 NOTE — Progress Notes (Signed)
Patient is sleeping peacefully. Maintained on telemetry and is in SR with occasional PACS noted. Patient maintained NPO for cardiac cath scheduled in the AM. Will continue to monitor.  Esperanza Heir, RN

## 2014-03-15 NOTE — Progress Notes (Signed)
[place] for Heparin Indication: chest pain/ACS  Allergies  Allergen Reactions  . Penicillins Other (See Comments)    Unknown childhood reaction    Labs:  Recent Labs  03/12/14 1911 03/12/14 2205  03/13/14 0334 03/13/14 1125 03/14/14 0406 03/15/14 0316 03/15/14 0500 03/15/14 0557  HGB 16.3  --   --  14.9  --  14.1  --  13.8  --   HCT 47.8  --   --  44.7  --  42.8  --  42.2  --   PLT 192  --   --  189  --  169  --  165  --   LABPROT  --   --   --   --   --   --   --   --  12.5  INR  --   --   --   --   --   --   --   --  0.93  HEPARINUNFRC  --   --   < > 0.29* 0.51 0.72* 0.49  --   --   CREATININE 1.13  --   --  1.12  --  1.02  --   --   --   TROPONINI  --  0.38*  --  0.87* 0.51*  --   --   --   --   < > = values in this interval not displayed.  Estimated Creatinine Clearance: 124.8 ml/min (by C-G formula based on Cr of 1.02).   Assessment: 58 yo man continues on heparin for ACS.  Today the heparin level is 0.49 on 1750 units/hr IV heparin. Ruled in for NSTEMI Cath planned for today. Heparin level is therapeutic.  CBC within normal / stable. No bleeding noted.  Goal of Therapy:  Heparin level 0.3-0.7 units/ml Monitor platelets by anticoagulation protocol: Yes   Plan:  Continue heparin to 1750 units / hr Follow up after cath today.  Thank you. Nicole Cella, RPh Clinical Pharmacist Pager: [phone]

## 2014-03-15 NOTE — Care Management Note (Signed)
    Page 1 of 1   03/15/2014     2:25:23 PM CARE MANAGEMENT NOTE 03/15/2014  Patient:  [name] [name], [name] [name]   Account Number:  [id number]  Date Initiated:  03/15/2014  Documentation initiated by:  [facility]  Subjective/Objective Assessment:   58 year old morbidly obese smoker with DM2, HTN, HL, diastolic HF, extensive history of CAD with multiple PCI procedures. Last cath in 6/15 = PCI OM-1 with DES. Patient resented to ER with CP. //Home alone.     Action/Plan:   Cardiac catheterization and possible PCI today.//Access for disposition needs.   Anticipated DC Date:  03/16/2014   Anticipated DC Plan:  HOME/SELF CARE         Choice offered to / List presented to:             Status of service:  In process, will continue to follow Medicare Important Message given?  YES (If response is "NO", the following Medicare IM given date fields will be blank) Date Medicare IM given:  03/15/2014 Medicare IM given by:  [facility] Date Additional Medicare IM given:   Additional Medicare IM given by:    Discharge Disposition:    Per UR Regulation:  Reviewed for med. necessity/level of care/duration of stay  If discussed at [place] of Stay Meetings, dates discussed:    Comments:

## 2014-03-15 NOTE — Interval H&P Note (Signed)
History and Physical Interval Note:  03/15/2014 3:48 PM  [name] [name]  has presented today for surgery, with the diagnosis of unstable angina  The various methods of treatment have been discussed with the patient and family. After consideration of risks, benefits and other options for treatment, the patient has consented to  Procedure(s): LEFT HEART CATHETERIZATION WITH CORONARY ANGIOGRAM (N/A) as a surgical intervention .  The patient's history has been reviewed, patient examined, no change in status, stable for surgery.  I have reviewed the patient's chart and labs.  Questions were answered to the patient's satisfaction.    Cath Lab Visit (complete for each Cath Lab visit)  Clinical Evaluation Leading to the Procedure:   ACS: Yes.    Non-ACS:    Anginal Classification: CCS IV  Anti-ischemic medical therapy: Maximal Therapy (2 or more classes of medications)  Non-Invasive Test Results: No non-invasive testing performed  Prior CABG: No previous CABG       [facility] 03/15/2014 3:48 PM

## 2014-03-15 NOTE — H&P (View-Only) (Signed)
Patient Name: [name] [name] Date of Encounter: 03/15/2014     Principal Problem:   Unstable angina Active Problems:   Obstructive sleep apnea   Essential hypertension, benign   Ischemic cardiomyopathy- EF 50-55% June 2015   Obesity, Class III, BMI 40-49.9 (morbid obesity)   DM2 (diabetes mellitus, type 2)   NSTEMI (non-ST elevated myocardial infarction)   CAD- multiple PCIs, last 6/15- OM DES    SUBJECTIVE  A little SOB but states he can lie flat. No Cp  CURRENT MEDS . amLODipine  10 mg Oral Daily  . aspirin  81 mg Oral Daily  . carvedilol  3.125 mg Oral BID WC  . cloNIDine  0.2 mg Oral BID  . clopidogrel  75 mg Oral q morning - 10a  . escitalopram  10 mg Oral Daily  . famotidine  40 mg Oral BID  . furosemide  60 mg Oral BID  . insulin aspart  0-20 Units Subcutaneous TID WC  . insulin aspart  0-5 Units Subcutaneous QHS  . losartan  100 mg Oral Daily  . nitroGLYCERIN  0.5 inch Topical 4 times per day  . potassium chloride SA  20 mEq Oral BID  . pravastatin  80 mg Oral Daily    OBJECTIVE  Filed Vitals:   03/14/14 1401 03/14/14 2042 03/15/14 0524 03/15/14 0943  BP: 130/68 140/67 144/83 152/74  Pulse: 58 58 56 83  Temp: 97.7 F (36.5 C) 97.8 F (36.6 C) 97.7 F (36.5 C)   TempSrc: Oral Oral Oral   Resp: 20 18 19 18   Height:      Weight:   336 lb 13.8 oz (152.8 kg)   SpO2: 94% 96% 95% 100%    Intake/Output Summary (Last 24 hours) at 03/15/14 1039 Last data filed at 03/15/14 0900  Gross per 24 hour  Intake 793.83 ml  Output   2375 ml  Net -1581.17 ml   Filed Weights   03/13/14 0044 03/14/14 0509 03/15/14 0524  Weight: 339 lb 3.2 oz (153.86 kg) 339 lb 3.2 oz (153.86 kg) 336 lb 13.8 oz (152.8 kg)    PHYSICAL EXAM  General appearance: alert, cooperative, no distress and morbidly obese  Lungs: clear to auscultation bilaterally  Heart: regular rate and rhythm  Extremities: no edema   Accessory Clinical Findings  CBC  Recent Labs  03/14/14 0406  03/15/14 0500  WBC 7.3 6.8  HGB 14.1 13.8  HCT 42.8 42.2  MCV 86.1 86.3  PLT 169 790   Basic Metabolic Panel  Recent Labs  03/13/14 0334 03/14/14 0406  NA 146 140  K 3.4* 4.0  CL 107 105  CO2 27 25  GLUCOSE 164* 139*  BUN 17 17  CREATININE 1.12 1.02  CALCIUM 9.3 9.0   Cardiac Enzymes  Recent Labs  03/12/14 2205 03/13/14 0334 03/13/14 1125  TROPONINI 0.38* 0.87* 0.51*   Fasting Lipid Panel  Recent Labs  03/13/14 0334  CHOL 129  HDL 44  LDLCALC 67  TRIG 88  CHOLHDL 2.9     TELE  Sinus brady.   Radiology/Studies  Dg Chest Port 1 View  03/12/2014   CLINICAL DATA:  Chest pain; history of tobacco use and sleep apnea coronary artery disease  EXAM: PORTABLE CHEST - 1 VIEW  COMPARISON:  PA and lateral chest x-ray of November 13, 2013  FINDINGS: The lungs are adequately inflated. The interstitial markings are coarse though stable. The cardiopericardial silhouette is top-normal in size. The central pulmonary vascularity is mildly prominent.  There is subsegmental atelectasis versus scarring at the right lung base. The bony thorax exhibits no acute abnormality.  IMPRESSION: There is no evidence of pneumonia nor definite evidence of CHF. The cardiac silhouette remains top-normal in size.   Electronically Signed   By: David  Martinique   On: 03/12/2014 19:59    ASSESSMENT AND PLAN 58 year old morbidly obese smoker with DM2, HTN, HL, diastolic HF (EF 08-67%), extensive history of CAD with multiple PCI procedures. Last cath in 6/15 underwent PCI OM-1 with DES. Patient resented to ER 03/12/14 with CP. Has been compliant with ASA and Plavix. He has ruled in for a NSTEMI.   NSTEMI/CAD - peak troponin 0.87. planned for LHC today at 12 -- Hx of multiple PCIs, last 6/15- OM DES  -- Cont ASA/plavix, carvedilol 3.125 BID, pravastatin 80mg   Ischemic cardiomyopathy- EF 50-55% June 2015  -- Continue BB and ARB -- Continue Lasix 60 mg BID. Net neg 3.4 L. Weight 339--> 336 lbs.  Obesity,  Class III, BMI 40-49.9 (morbid obesity)   DM2 (diabetes mellitus, type 2)   Obstructive sleep apnea - on CPAP but would like to switch to nasal cannula  Bradycardia- BB being was held. But now on a low dose of Coreg 3.125 mg BID. HRs stable in the 50s.  HTN- continue clonidine 0.2mg  BID, losartan 100mg , coreg 3.125 mg BID  Judy Pimple PA-C  Pager [phone]  Patient seen, examined. Available data reviewed. Agree with findings, assessment, and plan as outlined by Lorretta Harp, PA-C. Exam reveals an alert, oriented, pleasant obese male in no distress. Lungs are clear. Heart is regular rate and rhythm. There is no peripheral edema. All data was reviewed. The patient is well-known to me. His last PCI procedure was in June 2015 when he was treated with a drug-eluting stent in the obtuse marginal branch of the circumflex. He had residual disease noted in the distal AV circumflex and diffuse disease in the RCA. Medical therapy was recommended. He presents again with non-ST elevation infarction. He notes that he ran out of clonidine a few days prior to admission. He otherwise has been doing well. Plans reviewed for cardiac catheterization and possible PCI today.  I have reviewed the risks, indications, and alternatives to cardiac catheterization were reviewed with the patient. Risks include but are not limited to bleeding, infection, vascular injury, stroke, myocardial infection, arrhythmia, emergency cardiac surgery, and death. The patient understands the risks of serious complication is low (<2%).   Note that the patient's last procedure from the right radial was technically challenging because of marked subclavian tortuosity requiring use of guide catheters and stiff guidewires. May consider alternative site for access but will leave to discretion of the interventional cardiologist.  Sherren Mocha, M.D. 03/15/2014 12:11 PM

## 2014-03-15 NOTE — Progress Notes (Signed)
Patient is currently active with [facility] for chronic disease management services.  Patient has been engaged by a [employer].  Our community based plan of care has focused on disease management of CHF, medication procurement/adherence, and community resource support.  Will add a SW for depression.  Asked MD to have patient fitted for a nasal mask to improve compliance.  Patient will receive a post discharge transition of care call and will be evaluated for monthly home visits for assessments and disease process education.  Made Inpatient Case Manager aware that [facility] following. Of note, [facility] Care Management services does not replace or interfere with any services that are arranged by inpatient case management or social work.  For additional questions or referrals please contact Corliss Blacker BSN RN [facility] Liaison at [phone].

## 2014-03-16 ENCOUNTER — Other Ambulatory Visit: Payer: Self-pay | Admitting: Physician Assistant

## 2014-03-16 ENCOUNTER — Other Ambulatory Visit: Payer: Self-pay | Admitting: Cardiovascular Disease

## 2014-03-16 LAB — GLUCOSE, CAPILLARY
GLUCOSE-CAPILLARY: 125 mg/dL — AB (ref 70–99)
Glucose-Capillary: 120 mg/dL — ABNORMAL HIGH (ref 70–99)
Glucose-Capillary: 130 mg/dL — ABNORMAL HIGH (ref 70–99)

## 2014-03-16 MED ORDER — ACETAMINOPHEN 325 MG PO TABS: 650.0000 mg | ORAL_TABLET | ORAL | Status: DC | PRN

## 2014-03-16 MED ORDER — METFORMIN HCL 500 MG PO TABS: 500.0000 mg | ORAL_TABLET | Freq: Every day | ORAL | Status: DC

## 2014-03-16 MED ORDER — CARVEDILOL 3.125 MG PO TABS: 3.1250 mg | ORAL_TABLET | Freq: Two times a day (BID) | ORAL | Status: DC

## 2014-03-16 MED ORDER — ISOSORBIDE MONONITRATE ER 30 MG PO TB24: 30.0000 mg | ORAL_TABLET | Freq: Every day | ORAL | Status: DC

## 2014-03-16 NOTE — Progress Notes (Signed)
1815 discharge instructionds given to [name];ized understanding. Wheeled to lobby by NT

## 2014-03-16 NOTE — Progress Notes (Signed)
    SUBJECTIVE:  No further chest pian.  No SOB.    PHYSICAL EXAM Filed Vitals:   03/15/14 2105 03/15/14 2111 03/16/14 0237 03/16/14 0530  BP: 143/84  128/62 122/89  Pulse: 48 58 48 51  Temp:   97.7 F (36.5 C) 98 F (36.7 C)  TempSrc:   Oral Oral  Resp: 18  19 18   Height:      Weight:    330 lb 6.4 oz (149.868 kg)  SpO2: 96%  95% 100%   General:  No distress Lungs:  RRR Heart:  Clear Abdomen:  Positive bowel sounds, no rebound no guarding Extremities:  No edema   LABS:  Results for orders placed during the hospital encounter of 03/12/14 (from the past 24 hour(s))  GLUCOSE, CAPILLARY     Status: None   Collection Time    03/15/14 11:17 AM      Result Value Ref Range   Glucose-Capillary 90  70 - 99 mg/dL  GLUCOSE, CAPILLARY     Status: None   Collection Time    03/15/14  3:29 PM      Result Value Ref Range   Glucose-Capillary 76  70 - 99 mg/dL   Comment 1 Notify RN    POCT ACTIVATED CLOTTING TIME     Status: None   Collection Time    03/15/14  4:21 PM      Result Value Ref Range   Activated Clotting Time 101    GLUCOSE, CAPILLARY     Status: None   Collection Time    03/15/14  4:38 PM      Result Value Ref Range   Glucose-Capillary 75  70 - 99 mg/dL  GLUCOSE, CAPILLARY     Status: Abnormal   Collection Time    03/15/14  6:12 PM      Result Value Ref Range   Glucose-Capillary 110 (*) 70 - 99 mg/dL  GLUCOSE, CAPILLARY     Status: Abnormal   Collection Time    03/15/14  8:45 PM      Result Value Ref Range   Glucose-Capillary 153 (*) 70 - 99 mg/dL  GLUCOSE, CAPILLARY     Status: Abnormal   Collection Time    03/16/14  6:02 AM      Result Value Ref Range   Glucose-Capillary 125 (*) 70 - 99 mg/dL    Intake/Output Summary (Last 24 hours) at 03/16/14 0841 Last data filed at 03/16/14 0243  Gross per 24 hour  Intake    240 ml  Output   4600 ml  Net  -4360 ml     ASSESSMENT AND PLAN:  NSTEMI:  CAD as described on cath.  Medical management.  Add  nitrates. (Imdur 30 at discharge).    SLEEP APNEA:  Continue CPAP per primary MD/pulmonary  BRADYCARDIA:    Tolerating low dose of beta blocker.   Ok to go home.     Jeneen Rinks [facility] 03/16/2014 8:41 AM

## 2014-03-16 NOTE — Discharge Summary (Signed)
Patient ID: [name] [name],  MRN: [id number], DOB/AGE: 57/22/1957 57 y.o.  Admit date: 03/12/2014 Discharge date: 03/16/2014  Primary Care Provider: Cathlean Cower, MD Primary Cardiologist: Dr [name]  Discharge Diagnoses Principal Problem:   Unstable angina Active Problems:   NSTEMI (non-ST elevated myocardial infarction)   CAD- multiple PCIs, last 6/15- OM DES   Ischemic cardiomyopathy- EF 50-55% June 2015   Obesity, Class III, BMI 40-49.9 (morbid obesity)   DM2 (diabetes mellitus, type 2)   Obstructive sleep apnea   Essential hypertension, benign    Procedures: Coronary angiogram 03/15/14   Hospital Course:  58 year old morbidly obese smoker with DM2, HTN, HL, diastolic HF (EF 70-62% June 2015), extensive history of CAD with multiple PCI procedures. Last cath was 6/15 and the patient underwent PCI OM-1 with DES. The patient presented to the ER 03/12/14 with CP. He has been compliant with ASA and Plavix. He ruled in for a NSTEMI. Cath was done 03/15/14 and revealed diffuse CAD. There was severe disease in the small distal LCx and OM2. There was moderate disease in the mid RCA and distal LAD. This was not changed significantly since his prior study. The patients medications were adjusted. His beta blocker was cut back secondary to bradycardia. Imdur was added. The patient was seen by Dr Percival Spanish 03/16/14 and was felt to be stable for discharge.     Discharge Vitals:  Blood pressure 122/89, pulse 51, temperature 98 F (36.7 C), temperature source Oral, resp. rate 18, height 6\' 2"  (1.88 m), weight 330 lb 6.4 oz (149.868 kg), SpO2 100.00%.    Labs: Results for orders placed during the hospital encounter of 03/12/14 (from the past 24 hour(s))  GLUCOSE, CAPILLARY     Status: None   Collection Time    03/15/14 11:17 AM      Result Value Ref Range   Glucose-Capillary 90  70 - 99 mg/dL  GLUCOSE, CAPILLARY     Status: None   Collection Time    03/15/14  3:29 PM      Result Value Ref  Range   Glucose-Capillary 76  70 - 99 mg/dL   Comment 1 Notify RN    POCT ACTIVATED CLOTTING TIME     Status: None   Collection Time    03/15/14  4:21 PM      Result Value Ref Range   Activated Clotting Time 101    GLUCOSE, CAPILLARY     Status: None   Collection Time    03/15/14  4:38 PM      Result Value Ref Range   Glucose-Capillary 75  70 - 99 mg/dL  GLUCOSE, CAPILLARY     Status: Abnormal   Collection Time    03/15/14  6:12 PM      Result Value Ref Range   Glucose-Capillary 110 (*) 70 - 99 mg/dL  GLUCOSE, CAPILLARY     Status: Abnormal   Collection Time    03/15/14  8:45 PM      Result Value Ref Range   Glucose-Capillary 153 (*) 70 - 99 mg/dL  GLUCOSE, CAPILLARY     Status: Abnormal   Collection Time    03/16/14  6:02 AM      Result Value Ref Range   Glucose-Capillary 125 (*) 70 - 99 mg/dL    Disposition:  Follow-up Information   Follow up with [name], PA-C On 04/09/2014. (8:30 am)    Specialty:  Physician Assistant   Contact information:   [address]  [address] [place] [address] [phone]       Discharge Medications:    Medication List         acetaminophen 325 MG tablet  Commonly known as:  TYLENOL  Take 2 tablets (650 mg total) by mouth every 4 (four) hours as needed for headache or mild pain.     amLODipine 10 MG tablet  Commonly known as:  NORVASC  Take 1 tablet (10 mg total) by mouth daily.     aspirin 81 MG tablet  Take 81 mg by mouth daily.     carvedilol 3.125 MG tablet  Commonly known as:  COREG  Take 1 tablet (3.125 mg total) by mouth 2 (two) times daily with a meal.     cloNIDine 0.2 MG tablet  Commonly known as:  CATAPRES  Take 0.2 mg by mouth 2 (two) times daily.     clopidogrel 75 MG tablet  Commonly known as:  PLAVIX  Take 1 tablet (75 mg total) by mouth every morning.     escitalopram 10 MG tablet  Commonly known as:  LEXAPRO  Take 10 mg by mouth daily.     famotidine 40 MG tablet  Commonly known  as:  PEPCID  Take 40 mg by mouth 2 (two) times daily.     furosemide 40 MG tablet  Commonly known as:  LASIX  Take 60 mg by mouth 2 (two) times daily.     isosorbide mononitrate 30 MG 24 hr tablet  Commonly known as:  IMDUR  Take 1 tablet (30 mg total) by mouth daily.     lidocaine-hydrocortisone 3-0.5 % Crea  Commonly known as:  ANAMANTLE HC  Place 1 Applicatorful rectally 2 (two) times daily. As needed     losartan 100 MG tablet  Commonly known as:  COZAAR  Take 100 mg by mouth daily.     metFORMIN 500 MG tablet  Commonly known as:  GLUCOPHAGE  Take 1 tablet (500 mg total) by mouth daily with breakfast. HOLD for 48 hours, receiving on 06/11  Start taking on:  03/18/2014     nitroGLYCERIN 0.4 MG SL tablet  Commonly known as:  NITROSTAT  Place 1 tablet (0.4 mg total) under the tongue every 5 (five) minutes as needed for chest pain. For chest pain     potassium chloride SA 20 MEQ tablet  Commonly known as:  K-DUR,KLOR-CON  Take 1 tablet (20 mEq total) by mouth 2 (two) times daily.     pravastatin 80 MG tablet  Commonly known as:  PRAVACHOL  Take 1 tablet (80 mg total) by mouth every evening.         Duration of Discharge Encounter: Greater than 30 minutes including physician time.  Angelena Form PA-C 03/16/2014 9:14 AM  Patient seen and examined.  Plan as discussed in my rounding note for today and outlined above. Jeneen Rinks [facility]  03/16/2014  11:37 AM

## 2014-03-16 NOTE — Progress Notes (Signed)
Rt Note:  Spoke with patient about wearing CPAP.  Patient is not ready at this time but states he will let the nurse know when he is ready.  RN to call RT.

## 2014-03-16 NOTE — Discharge Instructions (Signed)
Angina Pectoris  Angina pectoris, often just called angina, is extreme discomfort in your chest, neck, or arm caused by a lack of blood in the middle and thickest layer of your heart wall (myocardium). It may feel like tightness or heavy pressure. It may feel like a crushing or squeezing pain. Some people say it feels like gas or indigestion. It may go down your shoulders, back, and arms. Some people may have symptoms other than pain. These symptoms include fatigue, shortness of breath, cold sweats, or nausea. There are four different types of angina:  · Stable angina--Stable angina usually occurs in episodes of predictable frequency and duration. It usually is brought on by physical activity, emotional stress, or excitement. These are all times when the myocardium needs more oxygen. Stable angina usually lasts a few minutes and often is relieved by taking a medicine that can be taken under your tongue (sublingually). The medicine is called nitroglycerin. Stable angina is caused by a buildup of plaque inside the arteries, which restricts blood flow to the heart muscle (atherosclerosis).  · Unstable angina--Unstable angina can occur even when your body experiences little or no physical exertion. It can occur during sleep. It can also occur at rest. It can suddenly increase in severity or frequency. It might not be relieved by sublingual nitroglycerin. It can last up to 30 minutes. The most common cause of unstable angina is a blood clot that has developed on the top of plaque buildup inside a coronary artery. It can lead to a heart attack if the blood clot completely blocks the artery.  · Microvascular angina--This type of angina is caused by a disorder of tiny blood vessels called arterioles. Microvascular angina is more common in women. The pain may be more severe and last longer than other types of angina pectoris.  · Prinzmetal or variant angina--This type of angina pectoris usually occurs when your body  experiences little or no physical exertion. It especially occurs in the early morning hours. It is caused by a spasm of your coronary artery.  HOME CARE INSTRUCTIONS   · Only take over-the-counter and prescription medicines as directed by your health care provider.  · Stay active or increase your exercise as directed by your health care provider.  · Limit strenuous activity as directed by your health care provider.  · Limit heavy lifting as directed by your health care provider.  · Maintain a healthy weight.  · Learn about and eat heart-healthy foods.  · Do not use any tobacco products including cigarettes, chewing tobacco or electronic cigarettes.  SEEK IMMEDIATE MEDICAL CARE IF:   You experience the following symptoms:  · Chest, neck, deep shoulder, or arm pain or discomfort that lasts more than a few minutes.  · Chest, neck, deep shoulder, or arm pain or discomfort that goes away and comes back, repeatedly.  · Heavy sweating with discomfort, without a noticeable cause.  · Shortness of breath or difficulty breathing.  · Angina that does not get better after a few minutes of rest or after taking sublingual nitroglycerin.  These can all be symptoms of a heart attack, which is a medical emergency! Get medical help at once. Call your local emergency service (911 in [place] immediately. Do not  drive yourself to the hospital and do not  wait to for your symptoms to go away.  MAKE SURE YOU:  · Understand these instructions.  · Will watch your condition.  · Will get help right away if you are not   doing well or get worse.  Document Released: 05/28/2005 Document Revised: 06/02/2013 Document Reviewed: 09/29/2013  Exit[employer]® Patient Information ©2015 [employer]. This information is not intended to replace advice given to you by your health care provider. Make sure you discuss any questions you have with your health care provider.

## 2014-03-16 NOTE — Progress Notes (Signed)
RT Note:  Patient refused CPAP at this time.  Patient will call if he wishes to be placed on CPAP.

## 2014-03-16 NOTE — Progress Notes (Signed)
Pt refused flu vacc . Will have it done as OP

## 2014-03-17 ENCOUNTER — Ambulatory Visit (HOSPITAL_COMMUNITY): Payer: Medicare HMO

## 2014-03-17 ENCOUNTER — Other Ambulatory Visit: Payer: Self-pay | Admitting: Cardiovascular Disease

## 2014-03-19 ENCOUNTER — Ambulatory Visit (HOSPITAL_COMMUNITY): Payer: Medicare HMO

## 2014-03-22 ENCOUNTER — Ambulatory Visit (HOSPITAL_COMMUNITY): Payer: Medicare HMO

## 2014-03-22 ENCOUNTER — Telehealth: Payer: Self-pay | Admitting: Cardiovascular Disease

## 2014-03-22 ENCOUNTER — Ambulatory Visit (HOSPITAL_COMMUNITY): Payer: Medicare HMO | Attending: Cardiology | Admitting: Cardiology

## 2014-03-22 DIAGNOSIS — I10 Essential (primary) hypertension: Secondary | ICD-10-CM | POA: Insufficient documentation

## 2014-03-22 DIAGNOSIS — R1030 Lower abdominal pain, unspecified: Secondary | ICD-10-CM | POA: Insufficient documentation

## 2014-03-22 DIAGNOSIS — E785 Hyperlipidemia, unspecified: Secondary | ICD-10-CM | POA: Insufficient documentation

## 2014-03-22 DIAGNOSIS — R1031 Right lower quadrant pain: Secondary | ICD-10-CM

## 2014-03-22 DIAGNOSIS — Z720 Tobacco use: Secondary | ICD-10-CM | POA: Insufficient documentation

## 2014-03-22 DIAGNOSIS — E119 Type 2 diabetes mellitus without complications: Secondary | ICD-10-CM | POA: Diagnosis not present

## 2014-03-22 DIAGNOSIS — R609 Edema, unspecified: Secondary | ICD-10-CM | POA: Insufficient documentation

## 2014-03-22 DIAGNOSIS — M7989 Other specified soft tissue disorders: Secondary | ICD-10-CM

## 2014-03-22 DIAGNOSIS — M79651 Pain in right thigh: Secondary | ICD-10-CM

## 2014-03-22 NOTE — Telephone Encounter (Signed)
I spoke with the pt and he had a cardiac catheterization on 03/15/14. The pt complains of right groin swelling, "hard area" in groin and tender to touch.  The pt also has swelling into his testicles. The pt has used Tylenol for pain and ice for swelling. The pt feels like the swelling has worsened since discharge from the hospital. I have arranged arterial groin duplex to be performed today.

## 2014-03-22 NOTE — Telephone Encounter (Signed)
New Prob   Pt had a procedure on 10/5 post MI and is still experiencing swelling, tightness, and pain to the groin/incision site. No discharge or blood. Pt is requesting to speak to a nurse.

## 2014-03-22 NOTE — Progress Notes (Signed)
Right arterial lower pseudo duplex performed  Results given to Theodosia Quay, RN.

## 2014-03-25 ENCOUNTER — Encounter: Payer: Self-pay | Admitting: Internal Medicine

## 2014-03-25 ENCOUNTER — Ambulatory Visit (INDEPENDENT_AMBULATORY_CARE_PROVIDER_SITE_OTHER): Payer: Commercial Managed Care - HMO | Admitting: Internal Medicine

## 2014-03-25 VITALS — BP 114/76 | HR 80 | Temp 98.1°F | Ht 73.0 in | Wt 335.4 lb

## 2014-03-25 DIAGNOSIS — G4733 Obstructive sleep apnea (adult) (pediatric): Secondary | ICD-10-CM

## 2014-03-25 DIAGNOSIS — E119 Type 2 diabetes mellitus without complications: Secondary | ICD-10-CM

## 2014-03-25 DIAGNOSIS — E785 Hyperlipidemia, unspecified: Secondary | ICD-10-CM

## 2014-03-25 DIAGNOSIS — I10 Essential (primary) hypertension: Secondary | ICD-10-CM

## 2014-03-25 MED ORDER — ACCU-CHEK MULTICLIX LANCETS MISC: Status: DC

## 2014-03-25 MED ORDER — METFORMIN HCL ER 500 MG PO TB24: ORAL_TABLET | ORAL | Status: DC

## 2014-03-25 MED ORDER — GLUCOSE BLOOD VI STRP: ORAL_STRIP | Status: DC

## 2014-03-25 NOTE — Progress Notes (Signed)
Subjective:    Patient ID: [name] [name], male    DOB: 09/07/1955, 58 y.o.   MRN: [id number]  HPI  Here to f/u; overall doing ok,  Pt denies chest pain, increased sob or doe, wheezing, orthopnea, PND, increased LE swelling, palpitations, dizziness or syncope.  Pt denies polydipsia, polyuria, or low sugar symptoms such as weakness or confusion improved with po intake.  Pt denies new neurological symptoms such as new headache, or facial or extremity weakness or numbness.   Pt states overall good compliance with meds, has been trying to follow lower cholesterol, diabetic diet, with wt overall stable,  but little exercise however  Also willing to re-start tx for sleep apnea, asks for referral.   Past Medical History  Diagnosis Date  . Dyslipidemia   . HTN (hypertension)   . CAD (coronary artery disease)     a. s/p PCI/BMS pRCA 2002; b. PCI pRCA & DES p/m RCA 2006; c. PCI/DES OM4 2007; d. PCI/CBA to RCA for ISR 10/2006; e.08/2012 STEMI/Cath/PCI: LM nl, LAD95-47m (3.0x20 Promus Prem DES), 95/95 ap LAD (1.0-1.22mm), LCX 30p 95d (sm), LPL1 70-80p, patent stent, LPL2 nl, RCA 30p, 40/70 ISR, EF 35-40%.  . Ischemic cardiomyopathy     a. EF 40%; improved to normal;  b. 08/2012 Echo: EF 50-55%, mod LVH.  [name] GERD (gastroesophageal reflux disease)   . Tobacco abuse   . Obesity   . OSA (obstructive sleep apnea)     Does not use CPAP as of 05/2011  . Depression   . History of pneumonia   . Diabetes mellitus     a. A1c 8.8 08/2012->Metformin initiated. => b. A1c (9/14): 6.6  . Hyperlipidemia   . Myocardial infarction    Past Surgical History  Procedure Laterality Date  . Coronary stent placement  2009  . Coronary angioplasty with stent placement      reports that he has been smoking Cigarettes.  He has a 15 pack-year smoking history. He does not have any smokeless tobacco history on file. He reports that he does not drink alcohol or use illicit drugs. family history includes Cancer in his mother; Coronary  artery disease in an other family member; Depression in his mother; Hypertension in his mother; Other in his father. Allergies  Allergen Reactions  . Penicillins Other (See Comments)    Unknown childhood reaction   Current Outpatient Prescriptions on File Prior to Visit  Medication Sig Dispense Refill  . acetaminophen (TYLENOL) 325 MG tablet Take 2 tablets (650 mg total) by mouth every 4 (four) hours as needed for headache or mild pain.      [name] amLODipine (NORVASC) 10 MG tablet Take 1 tablet (10 mg total) by mouth daily.  90 tablet  3  . aspirin 81 MG tablet Take 81 mg by mouth daily.      . carvedilol (COREG) 3.125 MG tablet Take 1 tablet (3.125 mg total) by mouth 2 (two) times daily with a meal.  60 tablet  11  . cloNIDine (CATAPRES) 0.2 MG tablet Take 0.2 mg by mouth 2 (two) times daily.      . clopidogrel (PLAVIX) 75 MG tablet Take 1 tablet (75 mg total) by mouth every morning.  90 tablet  3  . escitalopram (LEXAPRO) 10 MG tablet Take 10 mg by mouth daily.      . famotidine (PEPCID) 40 MG tablet Take 40 mg by mouth 2 (two) times daily.      . furosemide (LASIX) 40 MG tablet TAKE  1 AND 1/2 TABLET BY MOUTH TWICE DAILY  270 tablet  0  . isosorbide mononitrate (IMDUR) 30 MG 24 hr tablet Take 1 tablet (30 mg total) by mouth daily.  30 tablet  11  . lidocaine-hydrocortisone (ANAMANTLE HC) 3-0.5 % CREA Place 1 Applicatorful rectally 2 (two) times daily. As needed  28.35 g  1  . losartan (COZAAR) 100 MG tablet Take 100 mg by mouth daily.      . nitroGLYCERIN (NITROSTAT) 0.4 MG SL tablet Place 1 tablet (0.4 mg total) under the tongue every 5 (five) minutes as needed for chest pain. For chest pain  25 tablet  12  . potassium chloride SA (K-DUR,KLOR-CON) 20 MEQ tablet TAKE 1 TABLET BY MOUTH TWICE DAILY  180 tablet  0  . pravastatin (PRAVACHOL) 80 MG tablet Take 1 tablet (80 mg total) by mouth every evening.  90 tablet  3   No current facility-administered medications on file prior to visit.     Review of Systems  Constitutional: Negative for unusual diaphoresis or other sweats  HENT: Negative for ringing in ear Eyes: Negative for double vision or worsening visual disturbance.  Respiratory: Negative for choking and stridor.   Gastrointestinal: Negative for vomiting or other signifcant bowel change Genitourinary: Negative for hematuria or decreased urine volume.  Musculoskeletal: Negative for other MSK pain or swelling Skin: Negative for color change and worsening wound.  Neurological: Negative for tremors and numbness other than noted  Psychiatric/Behavioral: Negative for decreased concentration or agitation other than above       Objective:   Physical Exam BP 114/76  Pulse 80  Temp(Src) 98.1 F (36.7 C) (Oral)  Ht 6\' 1"  (1.854 m)  Wt 335 lb 6 oz (152.125 kg)  BMI 44.26 kg/m2  SpO2 96% VS noted,  Constitutional: Pt appears well-developed, well-nourished.  HENT: Head: NCAT.  Right Ear: External ear normal.  Left Ear: External ear normal.  Eyes: . Pupils are equal, round, and reactive to light. Conjunctivae and EOM are normal Neck: Normal range of motion. Neck supple.  Cardiovascular: Normal rate and regular rhythm.   Pulmonary/Chest: Effort normal and breath sounds normal.  Neurological: Pt is alert. Not confused , motor grossly intact Skin: Skin is warm. No rash Psychiatric: Pt behavior is normal. No agitation.     Assessment & Plan:

## 2014-03-25 NOTE — Progress Notes (Signed)
Pre visit review using our clinic review tool, if applicable. No additional management support is needed unless otherwise documented below in the visit note. 

## 2014-03-25 NOTE — Patient Instructions (Addendum)
OK to change the metformin 500 mg to the "ER" extended release form at 2 pills per day (sent to your pharmacy)  Please continue all other medications as before, and refills have been done if requested.  Please have the pharmacy call with any other refills you may need.  Please continue your efforts at being more active, low cholesterol diet, and weight control.  Please keep your appointments with your specialists as you may have planned  You will be contacted regarding the referral for: pulmonary for the sleep apnea  Please return in 6 months, or sooner if needed, with Lab testing done 3-5 days before

## 2014-03-26 ENCOUNTER — Telehealth: Payer: Self-pay | Admitting: Internal Medicine

## 2014-03-26 NOTE — Telephone Encounter (Signed)
emmi mailed  °

## 2014-03-27 DIAGNOSIS — G4733 Obstructive sleep apnea (adult) (pediatric): Secondary | ICD-10-CM | POA: Insufficient documentation

## 2014-03-27 NOTE — Assessment & Plan Note (Signed)
stable overall by history and exam, recent data reviewed with pt, and pt to continue medical treatment as before,  to f/u any worsening symptoms or concerns BP Readings from Last 3 Encounters:  03/25/14 114/76  03/16/14 122/89  03/16/14 122/89

## 2014-03-27 NOTE — Assessment & Plan Note (Signed)
stable overall by history and exam, recent data reviewed with pt, and pt to continue medical treatment as before,  to f/u any worsening symptoms or concerns Lab Results  Component Value Date   LDLCALC 67 03/13/2014

## 2014-03-27 NOTE — Assessment & Plan Note (Addendum)
Mild uncontrolled, to change metformin to metformin ER 500 - 2 qam, cont diet, wt control efforts, f/u labs next visit Lab Results  Component Value Date   HGBA1C 7.2* 01/26/2014

## 2014-03-27 NOTE — Assessment & Plan Note (Signed)
For pulm referral to re-start tx,  to f/u any worsening symptoms or concerns

## 2014-04-06 ENCOUNTER — Ambulatory Visit: Payer: Commercial Managed Care - HMO | Admitting: Pulmonary Disease

## 2014-04-09 ENCOUNTER — Encounter: Payer: Self-pay | Admitting: Physician Assistant

## 2014-04-09 ENCOUNTER — Ambulatory Visit (INDEPENDENT_AMBULATORY_CARE_PROVIDER_SITE_OTHER): Payer: Commercial Managed Care - HMO | Admitting: Physician Assistant

## 2014-04-09 VITALS — BP 110/70 | HR 65 | Ht 73.0 in | Wt 335.0 lb

## 2014-04-09 DIAGNOSIS — Z720 Tobacco use: Secondary | ICD-10-CM

## 2014-04-09 DIAGNOSIS — E1159 Type 2 diabetes mellitus with other circulatory complications: Secondary | ICD-10-CM

## 2014-04-09 DIAGNOSIS — I5032 Chronic diastolic (congestive) heart failure: Secondary | ICD-10-CM

## 2014-04-09 DIAGNOSIS — I2510 Atherosclerotic heart disease of native coronary artery without angina pectoris: Secondary | ICD-10-CM

## 2014-04-09 DIAGNOSIS — E785 Hyperlipidemia, unspecified: Secondary | ICD-10-CM

## 2014-04-09 DIAGNOSIS — G4733 Obstructive sleep apnea (adult) (pediatric): Secondary | ICD-10-CM

## 2014-04-09 DIAGNOSIS — I10 Essential (primary) hypertension: Secondary | ICD-10-CM

## 2014-04-09 MED ORDER — ISOSORBIDE MONONITRATE ER 30 MG PO TB24: 45.0000 mg | ORAL_TABLET | Freq: Every day | ORAL | Status: DC

## 2014-04-09 NOTE — Patient Instructions (Signed)
Your physician recommends that you schedule a follow-up appointment in:  3 months with [name], PA  You have been referred to cardiac rehab.  We will send in the paperwork and cardiac rehab will contact you.  Your physician has recommended you make the following change in your medication: Increase Imdur to 45 mg by mouth daily.  This will be one and one-half on the 30 mg tablets.

## 2014-04-09 NOTE — Progress Notes (Signed)
Cardiology Office Note   Date:  04/09/2014   ID:  [name] [name], DOB 03-Feb-1956, MRN [id number]  PCP:  Cathlean Cower, MD  Cardiologist:  Dr. Sherren Mocha     History of Present Illness: [name] [name] is a 58 y.o. male prior patient of mine from [place] with a hx of CAD, s/p PCI to the RCA and PL1, cutting balloon angioplasty secondary to in-stent restenosis of the RCA in 5/08, ischemic cardiomyopathy with previous EF 40% (improved to normal in the past), diastolic CHF, HTN, HL.   Admitted 08/2012 with an anterior STEMI treated with a Promus DES to the LAD. He had diffuse residual disease and medical therapy was recommended. EF was 35-40%. Follow up demonstrated improved LVF with EF 50-55%.   ED visit 01/4695 with a/c diastolic CHF.   Admitted 11/2013 with NSTEMI.  Severe stenosis OM1 treated with PCI (2.75x15 mm Xience Alpine DES).    He was recently admitted 10/2-10/6 with a non-STEMI. LHC demonstrated severe disease in the small distal LCx and OM2, moderate disease in the mid RCA and distal LAD. This was not changed significantly since his prior study and medical therapy was continued.  Beta blocker was reduced secondary to bradycardia. Isosorbide was added for anginal control. He returns for follow-up.  He tells me that he feels really good now.  However, he also tells me he is still having chest discomfort.  He describes this as a tingling.  It is brief (seconds) but usually comes on with activity.  He notes dyspnea with moderate activities (NYHA 2b-3).  He sleeps on 3 pillows chronically.  No PND, edema.  No syncope.  Unfortunately, he continues to smoke.  He has started a recent smoking cessation program.    Studies:  - LHC (10/15):  Proximal LAD ectatic, mid LAD stent patent, distal LAD 70% at the apex, proximal and mid circumflex diffuse ectasia, small OM1 with CTO, OM2 stent patent, distal OM2 90%, OM3 30-40%, very distal circumflex 90%, proximal to mid RCA stent patent, mid RCA  stent 60-70% ISR, EF 50%, inferobasal akinesis  - Echo (6/15): Mild LVH. EF 50% to 55%. Wall motion was normal. Grade 1 diastolic dysfunction. Left atrium: The atrium was mildly dilated.  Right ventricle: The cavity size was moderately dilated.  Right atrium: The atrium was mildly to moderately dilated.  - Myoview 9/12: mod scar in base/mid inf and IL segments, slight peri-infarct ischemia - low risk (med Rx continued).  - Carotid US (4/13): Bilateral: no ICA stenosis   Recent Labs/Images:  Recent Labs  01/26/14 0829 03/12/14 1911 03/13/14 0334 03/14/14 0406 03/15/14 0500  NA 142 145 146 140  --   K 4.0 3.6* 3.4* 4.0  --   BUN 15 17 17 17   --   CREATININE 1.2 1.13 1.12 1.02  --   ALT 25  --   --   --   --   HGB 15.6 16.3 14.9 14.1 13.8  TSH 0.97  --   --   --   --   LDLCALC 81  --  67  --   --   HDL 44.40  --  44  --   --   PROBNP  --  229.0*  --   --   --      Dg Chest Port 1 View  03/12/2014    IMPRESSION: There is no evidence of pneumonia nor definite evidence of CHF. The cardiac silhouette remains top-normal in size.   Electronically Signed  By: David  Martinique   On: 03/12/2014 19:59     Wt Readings from Last 3 Encounters:  04/09/14 335 lb (151.955 kg)  03/25/14 335 lb 6 oz (152.125 kg)  03/16/14 330 lb 6.4 oz (149.868 kg)     Past Medical History  Diagnosis Date  . Dyslipidemia   . HTN (hypertension)   . CAD (coronary artery disease)     a. s/p PCI/BMS pRCA 2002; b. PCI pRCA & DES p/m RCA 2006; c. PCI/DES OM4 2007; d. PCI/CBA to RCA for ISR 10/2006; e.08/2012 STEMI/Cath/PCI: LM nl, LAD95-56m (3.0x20 Promus Prem DES), 95/95 ap LAD (1.0-1.81mm), LCX 30p 95d (sm), LPL1 70-80p, patent stent, LPL2 nl, RCA 30p, 40/70 ISR, EF 35-40%.  . Ischemic cardiomyopathy     a. EF 40%; improved to normal;  b. 08/2012 Echo: EF 50-55%, mod LVH.  [name] GERD (gastroesophageal reflux disease)   . Tobacco abuse   . Obesity   . OSA (obstructive sleep apnea)     Does not use CPAP as of 05/2011  .  Depression   . History of pneumonia   . Diabetes mellitus     a. A1c 8.8 08/2012->Metformin initiated. => b. A1c (9/14): 6.6  . Hyperlipidemia   . Myocardial infarction     Current Outpatient Prescriptions  Medication Sig Dispense Refill  . amLODipine (NORVASC) 10 MG tablet Take 1 tablet (10 mg total) by mouth daily.  90 tablet  3  . aspirin 81 MG tablet Take 81 mg by mouth daily.      . carvedilol (COREG) 3.125 MG tablet Take 1 tablet (3.125 mg total) by mouth 2 (two) times daily with a meal.  60 tablet  11  . cloNIDine (CATAPRES) 0.2 MG tablet Take 0.2 mg by mouth 2 (two) times daily.      . clopidogrel (PLAVIX) 75 MG tablet Take 1 tablet (75 mg total) by mouth every morning.  90 tablet  3  . escitalopram (LEXAPRO) 10 MG tablet Take 10 mg by mouth daily.      . famotidine (PEPCID) 40 MG tablet Take 40 mg by mouth 2 (two) times daily.      . furosemide (LASIX) 40 MG tablet TAKE 1 AND 1/2 TABLET BY MOUTH TWICE DAILY  270 tablet  0  . glucose blood (ACCU-CHEK AVIVA) test strip Use as directed three times daily to check blood sugar.  Diagnosis code E11.9  300 each  11  . isosorbide mononitrate (IMDUR) 30 MG 24 hr tablet Take 1 tablet (30 mg total) by mouth daily.  30 tablet  11  . Lancets (ACCU-CHEK MULTICLIX) lancets Use as directed three times daily to check blood sugar.  Diagnosis code E11.9  300 each  11  . losartan (COZAAR) 100 MG tablet Take 100 mg by mouth daily.      . metFORMIN (GLUCOPHAGE-XR) 500 MG 24 hr tablet 2 tabs by mouth in the am  180 tablet  3  . nitroGLYCERIN (NITROSTAT) 0.4 MG SL tablet Place 1 tablet (0.4 mg total) under the tongue every 5 (five) minutes as needed for chest pain. For chest pain  25 tablet  12  . potassium chloride SA (K-DUR,KLOR-CON) 20 MEQ tablet TAKE 1 TABLET BY MOUTH TWICE DAILY  180 tablet  0  . pravastatin (PRAVACHOL) 80 MG tablet Take 1 tablet (80 mg total) by mouth every evening.  90 tablet  3   No current facility-administered medications for  this visit.     Allergies:   Penicillins  Social History:  The patient  reports that he has been smoking Cigarettes.  He has a 15 pack-year smoking history. He does not have any smokeless tobacco history on file. He reports that he does not drink alcohol or use illicit drugs.   Family History:  The patient's family history includes Cancer in his mother; Coronary artery disease in an other family member; Depression in his mother; Hypertension in his mother; Other in his father. There is no history of Heart attack or Stroke.   ROS:  Please see the history of present illness.   He notes scant BRBPR recently after straining with a BM.  Otherwise denies bleeding.     All other systems reviewed and negative.    PHYSICAL EXAM: VS:  BP 110/70  Pulse 65  Ht 6\' 1"  (1.854 m)  Wt 335 lb (151.955 kg)  BMI 44.21 kg/m2 Well nourished, well developed, in no acute distress HEENT: normal Neck:  no JVD Cardiac:  normal S1, S2;  RRR; no murmur Lungs:   clear to auscultation bilaterally, no wheezing, rhonchi or rales Abd: soft, nontender, no hepatomegaly Ext:   groin without hematoma or bruit no edema Skin: warm and dry Neuro:  CNs 2-12 intact, no focal abnormalities noted  EKG:  NSR, HR 65, inf Q waves, inf-lat TWI      ASSESSMENT AND PLAN:  1.  Coronary artery disease:  He is overall doing well. However, he continues to have some chest symptoms with exertion.  These are not exactly like his anginal symptoms that brought him to the hospital.      -  Continue Amlodipine, beta blocker, ASA, Plavix.    -  Increase Isosorbide to 45 mg QD.    -  If CP continues, consider Ranexa (if we can get approved for $ assistance).    -  Refer to cardiac rehab.  2.  Chronic diastolic congestive heart failure:  Volume stable.   3.  Essential hypertension, benign:  BP controlled.  This may limit further anti-anginal Rx. 4.  Hyperlipidemia:   Recent LDL optimal.  Continue statin. 5.  Tobacco abuse:  He  understands the importance of cessation.  He starts a cessation program soon.  6.  Type 2 diabetes mellitus with other circulatory complications:  2/33/6122: Hemoglobin-A1c 7.2*.  Fair control.  FU with PCP.  7.  OSA (obstructive sleep apnea):  He sees Dr. Gwenette Greet next week.  8.  Obesity, Class III, BMI 40-49.9 (morbid obesity):  We discussed the importance of diet and exercise as well as weight loss in the context of his overall health.    Disposition:   FU with me in 3 mos.   Signed, Versie Starks, MHS 04/09/2014 9:03 AM    [facility] [address], [place], [place]  [address] Phone: [phone]; Fax: [phone]

## 2014-04-12 ENCOUNTER — Ambulatory Visit: Payer: Commercial Managed Care - HMO | Admitting: Pulmonary Disease

## 2014-04-13 ENCOUNTER — Other Ambulatory Visit: Payer: Self-pay | Admitting: Cardiovascular Disease

## 2014-04-13 ENCOUNTER — Other Ambulatory Visit: Payer: Self-pay

## 2014-04-13 MED ORDER — ACCU-CHEK MULTICLIX LANCETS MISC: Status: DC

## 2014-04-13 MED ORDER — ACCU-CHEK AVIVA PLUS W/DEVICE KIT: PACK | Status: DC

## 2014-04-13 MED ORDER — GLUCOSE BLOOD VI STRP: ORAL_STRIP | Status: DC

## 2014-04-29 ENCOUNTER — Ambulatory Visit: Payer: Commercial Managed Care - HMO | Admitting: Pulmonary Disease

## 2014-04-30 ENCOUNTER — Other Ambulatory Visit: Payer: Self-pay | Admitting: *Deleted

## 2014-04-30 MED ORDER — POTASSIUM CHLORIDE CRYS ER 20 MEQ PO TBCR: 20.0000 meq | EXTENDED_RELEASE_TABLET | Freq: Two times a day (BID) | ORAL | Status: DC

## 2014-04-30 NOTE — Telephone Encounter (Signed)
Pt called stating he has been requesting refill on his potassium pharmacy hasn't received back. Inform pt will send to [facility]...[name] [name]

## 2014-05-13 ENCOUNTER — Other Ambulatory Visit: Payer: Self-pay | Admitting: Internal Medicine

## 2014-05-20 ENCOUNTER — Encounter (HOSPITAL_COMMUNITY): Payer: Self-pay | Admitting: Cardiovascular Disease

## 2014-06-02 ENCOUNTER — Other Ambulatory Visit: Payer: Self-pay | Admitting: Cardiovascular Disease

## 2014-06-09 ENCOUNTER — Other Ambulatory Visit: Payer: Self-pay | Admitting: Cardiovascular Disease

## 2014-06-09 MED ORDER — LOSARTAN POTASSIUM 100 MG PO TABS: 100.0000 mg | ORAL_TABLET | Freq: Every day | ORAL | Status: DC

## 2014-06-22 ENCOUNTER — Other Ambulatory Visit: Payer: Self-pay | Admitting: Internal Medicine

## 2014-07-09 ENCOUNTER — Encounter: Payer: Commercial Managed Care - HMO | Admitting: Physician Assistant

## 2014-07-09 NOTE — Progress Notes (Deleted)
Cardiology Office Note   Date:  07/09/2014   ID:  [name] [name], DOB June 21, 1955, MRN [id number]  PCP:  Cathlean Cower, MD  Cardiologist:  Dr. Sherren Mocha    Chief Complaint  Patient presents with  . Coronary Artery Disease    Follow Up Visit     History of Present Illness: [name] [name] is a 59 y.o. male who presents for FU on the above.   He is a prior patient of mine from [place] with a hx of CAD, s/p PCI to the RCA and PL1, cutting balloon angioplasty secondary to in-stent restenosis of the RCA in 5/08, ischemic cardiomyopathy with previous EF 40% (improved to normal in the past), diastolic CHF, HTN, HL.   Admitted 08/2012 with an anterior STEMI treated with a Promus DES to the LAD. He had diffuse residual disease and medical therapy was recommended. EF was 35-40%. Follow up demonstrated improved LVF with EF 50-55%.   Admitted 11/2013 with NSTEMI. Severe stenosis OM1 treated with PCI (2.75x15 mm Xience Alpine DES).   Admitted 03/2014 with a NSTEMI. LHC demonstrated severe disease in the small distal LCx and OM2, moderate disease in the mid RCA and distal LAD. This was not changed significantly since his prior study and medical therapy was continued.   I saw him in 03/2014. He continued to have some chest discomfort with exertion and I adjusted his antianginals.  ***   Studies Reviewed: - LHC (10/15): Proximal LAD ectatic, mid LAD stent patent, distal LAD 70% at the apex, proximal and mid circumflex diffuse ectasia, small OM1 with CTO, OM2 stent patent, distal OM2 90%, OM3 30-40%, very distal circumflex 90%, proximal to mid RCA stent patent, mid RCA stent 60-70% ISR, EF 50%, inferobasal akinesis - Echo (6/15): Mild LVH. EF 50% to 55%. Wall motion was normal. Grade 1 diastolic dysfunction. Left atrium: The atrium was mildly dilated. Right ventricle: The cavity size was moderately dilated. Right atrium: The atrium was mildly to moderately dilated. - Myoview 9/12: mod  scar in base/mid inf and IL segments, slight peri-infarct ischemia - low risk (med Rx continued). - Carotid US (4/13): Bilateral: no ICA stenosis    Past Medical History  Diagnosis Date  . Dyslipidemia   . HTN (hypertension)   . CAD (coronary artery disease)     a. s/p PCI/BMS pRCA 2002; b. PCI pRCA & DES p/m RCA 2006; c. PCI/DES OM4 2007; d. PCI/CBA to RCA for ISR 10/2006; e.08/2012 STEMI/Cath/PCI: LM nl, LAD95-27m(3.0x20 Promus Prem DES), 95/95 ap LAD (1.0-1.572m, LCX 30p 95d (sm), LPL1 70-80p, patent stent, LPL2 nl, RCA 30p, 40/70 ISR, EF 35-40%.  . Ischemic cardiomyopathy     a. EF 40%; improved to normal;  b. 08/2012 Echo: EF 50-55%, mod LVH.  . Marland KitchenERD (gastroesophageal reflux disease)   . Tobacco abuse   . Obesity   . OSA (obstructive sleep apnea)     Does not use CPAP as of 05/2011  . Depression   . History of pneumonia   . Diabetes mellitus     a. A1c 8.8 08/2012->Metformin initiated. => b. A1c (9/14): 6.6  . Hyperlipidemia   . Myocardial infarction     Past Surgical History  Procedure Laterality Date  . Coronary stent placement  2009  . Coronary angioplasty with stent placement    . Left heart catheterization with coronary angiogram N/A 08/31/2012    Procedure: LEFT HEART CATHETERIZATION WITH CORONARY ANGIOGRAM;  Surgeon: ChBurnell BlanksMD;  [facility];  Service: Cardiovascular;  Laterality: N/A;  . Percutaneous coronary stent intervention (pci-s) Right 08/31/2012    Procedure: PERCUTANEOUS CORONARY STENT INTERVENTION (PCI-S);  Surgeon: Burnell Blanks, MD;  Location: [facility] CATH LAB;  Service: Cardiovascular;  Laterality: Right;  . Left heart catheterization with coronary angiogram N/A 11/16/2013    Procedure: LEFT HEART CATHETERIZATION WITH CORONARY ANGIOGRAM;  Surgeon: Blane Ohara, MD;  Location: [facility] CATH LAB;  Service: Cardiovascular;  Laterality: N/A;  . Percutaneous coronary stent intervention (pci-s)  11/16/2013    Procedure: PERCUTANEOUS CORONARY  STENT INTERVENTION (PCI-S);  Surgeon: Blane Ohara, MD;  Location: [facility] CATH LAB;  Service: Cardiovascular;;  . Left heart catheterization with coronary angiogram N/A 03/15/2014    Procedure: LEFT HEART CATHETERIZATION WITH CORONARY ANGIOGRAM;  Surgeon: Peter M Martinique, MD;  Location: [facility] CATH LAB;  Service: Cardiovascular;  Laterality: N/A;     Current Outpatient Prescriptions  Medication Sig Dispense Refill  . amLODipine (NORVASC) 10 MG tablet TAKE 1 TABLET BY MOUTH DAILY 90 tablet 2  . aspirin 81 MG tablet Take 81 mg by mouth daily.    . Blood Glucose Monitoring Suppl (ACCU-CHEK AVIVA PLUS) W/DEVICE KIT Use as directed to check blood sugar.  Diagnosis code E11.9 1 kit 0  . carvedilol (COREG) 3.125 MG tablet Take 1 tablet (3.125 mg total) by mouth 2 (two) times daily with a meal. 60 tablet 11  . cloNIDine (CATAPRES) 0.2 MG tablet Take 0.2 mg by mouth 2 (two) times daily.    . cloNIDine (CATAPRES) 0.2 MG tablet TAKE 1 TABLET BY MOUTH TWICE DAILY 60 tablet 5  . clopidogrel (PLAVIX) 75 MG tablet Take 1 tablet (75 mg total) by mouth every morning. 90 tablet 3  . clopidogrel (PLAVIX) 75 MG tablet TAKE 1 TABLET BY MOUTH EVERY MORNING 90 tablet 2  . escitalopram (LEXAPRO) 10 MG tablet Take 10 mg by mouth daily.    . famotidine (PEPCID) 40 MG tablet Take 40 mg by mouth 2 (two) times daily.    . furosemide (LASIX) 40 MG tablet TAKE 1 AND 1/2 TABLET BY MOUTH TWICE DAILY 270 tablet 0  . furosemide (LASIX) 40 MG tablet TAKE 1 AND 1/2 TABLET BY MOUTH TWICE DAILY 90 tablet 3  . glucose blood (ACCU-CHEK AVIVA) test strip Use as directed three times daily to check blood sugar.  Diagnosis code E11.9 300 each 3  . isosorbide mononitrate (IMDUR) 30 MG 24 hr tablet Take 1.5 tablets (45 mg total) by mouth daily. 45 tablet 11  . Lancets (ACCU-CHEK MULTICLIX) lancets Use as directed three times daily to check blood sugar.  Diagnosis code E11.9 300 each 3  . losartan (COZAAR) 100 MG tablet TAKE 1 TABLET BY MOUTH  EVERY DAY 90 tablet 0  . losartan (COZAAR) 100 MG tablet Take 1 tablet (100 mg total) by mouth daily. 90 tablet 1  . metFORMIN (GLUCOPHAGE-XR) 500 MG 24 hr tablet 2 tabs by mouth in the am 180 tablet 3  . nitroGLYCERIN (NITROSTAT) 0.4 MG SL tablet Place 1 tablet (0.4 mg total) under the tongue every 5 (five) minutes as needed for chest pain. For chest pain 25 tablet 12  . potassium chloride SA (K-DUR,KLOR-CON) 20 MEQ tablet Take 1 tablet (20 mEq total) by mouth 2 (two) times daily. 180 tablet 0  . pravastatin (PRAVACHOL) 80 MG tablet TAKE 1 TABLET BY MOUTH EVERY EVENING 90 tablet 2   No current facility-administered medications for this visit.    Allergies:   Penicillins  Social History:  The patient  reports that he has been smoking Cigarettes.  He has a 15 pack-year smoking history. He does not have any smokeless tobacco history on file. He reports that he does not drink alcohol or use illicit drugs.   Family History:  The patient's ***family history includes Cancer in his mother; Coronary artery disease in an other family member; Depression in his mother; Hypertension in his mother; Other in his father. There is no history of Heart attack or Stroke.    ROS:  Please see the history of present illness.   Otherwise, review of systems are positive for {NONE DEFAULTED:18576}.   All other systems are reviewed and negative.    PHYSICAL EXAM: VS:  There were no vitals taken for this visit.    Wt Readings from Last 3 Encounters:  04/09/14 335 lb (151.955 kg)  03/25/14 335 lb 6 oz (152.125 kg)  03/16/14 330 lb 6.4 oz (149.868 kg)     GEN: Well nourished, well developed, in no acute distress HEENT: normal Neck: *** JVD, ***carotid bruits, no masses Cardiac:  Normal S1/S2, ***RRR; *** murmur ***, *** no rubs or gallops, {NUMBERS; 1+ TO 4+, TRACE/RARE:14493} edema  Respiratory:  ***clear to auscultation bilaterally, no wheezing, rhonchi or rales. GI: ***soft, nontender, nondistended, +  BS MS: no deformity or atrophy Skin: warm and dry  Neuro:  CNs II-XII intact, Strength and sensation are intact Psych: Normal affect   EKG:  EKG {ACTION; IS/IS ZOX:09604540} ordered today.  It demonstrates:   ***   Recent Labs: 01/26/2014: ALT 25; TSH 0.97 03/12/2014: Pro B Natriuretic peptide (BNP) 229.0* 03/14/2014: BUN 17; Creatinine 1.02; Potassium 4.0; Sodium 140 03/15/2014: Hemoglobin 13.8; Platelets 165    Lipid Panel    Component Value Date/Time   CHOL 129 03/13/2014 0334   TRIG 88 03/13/2014 0334   HDL 44 03/13/2014 0334   CHOLHDL 2.9 03/13/2014 0334   VLDL 18 03/13/2014 0334   LDLCALC 67 03/13/2014 0334      ASSESSMENT AND PLAN:  1. Coronary artery disease: *** 2. Chronic diastolic congestive heart failure: ***Volume stable.  3. Essential hypertension, benign: ***BP controlled.  4. Hyperlipidemia: ***Recent LDL optimal. Continue statin. 5. Tobacco abuse: ***He understands the importance of cessation. He starts a cessation program soon.  6. Type 2 diabetes mellitus with other circulatory complications: 9/81/1914: Hemoglobin-A1c 7.2*. Fair control. FU with PCP.  7. OSA (obstructive sleep apnea): *** 8. Obesity, Class III, BMI 40-49.9 (morbid obesity): ***   Current medicines are reviewed at length with the patient today.  The patient {ACTIONS; HAS/DOES NOT HAVE:19233} concerns regarding medicines.  The following changes have been made:  {PLAN; NO CHANGE:13088:s}   Labs/ tests ordered today include: ***No orders of the defined types were placed in this encounter.     Disposition:   FU with *** in {gen number [id number]} {TIME; UNITS DAY/WEEK/MONTH:19136}   Signed, Versie Starks, MHS 07/09/2014 7:54 AM    [facility] [place] [place], [place], [place]  [address] Phone: [phone]; Fax: [phone]

## 2014-07-12 NOTE — Progress Notes (Signed)
This encounter was created in error - please disregard.

## 2014-07-15 NOTE — Progress Notes (Signed)
Cardiology Office Note   Date:  07/16/2014   ID:  [name] [name], DOB 1955-08-13, MRN [id number]  PCP:  Cathlean Cower, MD  Cardiologist:  Dr. Sherren Mocha     Chief Complaint  Patient presents with  . Follow-up    CAD     History of Present Illness: [name] [name] is a 59 y.o. male prior patient of mine from [place] with a hx of CAD, s/p PCI to the RCA and PL1, cutting balloon angioplasty secondary to in-stent restenosis of the RCA in 5/08, ischemic cardiomyopathy with previous EF 40% (improved to normal in the past), diastolic CHF, HTN, HL.   Admitted 08/2012 with an anterior STEMI treated with a Promus DES to the LAD. He had diffuse residual disease and medical therapy was recommended. EF was 35-40%. Follow up demonstrated improved LVF with EF 50-55%.   Admitted 11/2013 with NSTEMI. Severe stenosis OM1 treated with PCI (2.75x15 mm Xience Alpine DES).  Admitted 03/2014 with NSTEMI. LHC demonstrated severe disease in the small distal LCx and OM2, moderate disease in the mid RCA and distal LAD. This was not changed significantly since his prior study and medical therapy was continued. He had BB reduced 2/2 bradycardia.    I saw him last 03/2014.  He returns for FU.  Overall, he has been doing well. He tells me that he has continued to have shortness of breath. He describes NYHA 2b-3 symptoms. He recently has added a couple of pillows at night. He now sleeps on 4 pillows. He denies PND or significantly worsened LE edema. He does feel like his abdominal girth has increased. He denies chest discomfort or syncope.   Studies: - LHC (10/15): Proximal LAD ectatic, mid LAD stent patent, distal LAD 70% at the apex, proximal and mid circumflex diffuse ectasia, small OM1 with CTO, OM2 stent patent, distal OM2 90%, OM3 30-40%, very distal circumflex 90%, proximal to mid RCA stent patent, mid RCA stent 60-70% ISR, EF 50%, inferobasal akinesis - Echo (6/15): Mild LVH. EF 50% to 55%. Wall motion  was normal. Grade 1 diastolic dysfunction. Left atrium: The atrium was mildly dilated. Right ventricle: The cavity size was moderately dilated. Right atrium: The atrium was mildly to moderately dilated. - Myoview 9/12: mod scar in base/mid inf and IL segments, slight peri-infarct ischemia - low risk (med Rx continued). - Carotid US (4/13): Bilateral: no ICA stenosis    Past Medical History  Diagnosis Date  . Dyslipidemia   . HTN (hypertension)   . CAD (coronary artery disease)     a. s/p PCI/BMS pRCA 2002; b. PCI pRCA & DES p/m RCA 2006; c. PCI/DES OM4 2007; d. PCI/CBA to RCA for ISR 10/2006; e.08/2012 STEMI/Cath/PCI: LM nl, LAD95-51m (3.0x20 Promus Prem DES), 95/95 ap LAD (1.0-1.31mm), LCX 30p 95d (sm), LPL1 70-80p, patent stent, LPL2 nl, RCA 30p, 40/70 ISR, EF 35-40%.  . Ischemic cardiomyopathy     a. EF 40%; improved to normal;  b. 08/2012 Echo: EF 50-55%, mod LVH.  [name] GERD (gastroesophageal reflux disease)   . Tobacco abuse   . Obesity   . OSA (obstructive sleep apnea)     Does not use CPAP as of 05/2011  . Depression   . History of pneumonia   . Diabetes mellitus     a. A1c 8.8 08/2012->Metformin initiated. => b. A1c (9/14): 6.6  . Hyperlipidemia   . Myocardial infarction     Past Surgical History  Procedure Laterality Date  . Coronary stent placement  2009  .  Coronary angioplasty with stent placement    . Left heart catheterization with coronary angiogram N/A 08/31/2012    Procedure: LEFT HEART CATHETERIZATION WITH CORONARY ANGIOGRAM;  Surgeon: Burnell Blanks, MD;  Location: [facility] CATH LAB;  Service: Cardiovascular;  Laterality: N/A;  . Percutaneous coronary stent intervention (pci-s) Right 08/31/2012    Procedure: PERCUTANEOUS CORONARY STENT INTERVENTION (PCI-S);  Surgeon: Burnell Blanks, MD;  Location: [facility] CATH LAB;  Service: Cardiovascular;  Laterality: Right;  . Left heart catheterization with coronary angiogram N/A 11/16/2013    Procedure: LEFT HEART  CATHETERIZATION WITH CORONARY ANGIOGRAM;  Surgeon: Blane Ohara, MD;  Location: [facility] CATH LAB;  Service: Cardiovascular;  Laterality: N/A;  . Percutaneous coronary stent intervention (pci-s)  11/16/2013    Procedure: PERCUTANEOUS CORONARY STENT INTERVENTION (PCI-S);  Surgeon: Blane Ohara, MD;  Location: [facility] CATH LAB;  Service: Cardiovascular;;  . Left heart catheterization with coronary angiogram N/A 03/15/2014    Procedure: LEFT HEART CATHETERIZATION WITH CORONARY ANGIOGRAM;  Surgeon: Peter M Martinique, MD;  Location: [facility] CATH LAB;  Service: Cardiovascular;  Laterality: N/A;     Current Outpatient Prescriptions  Medication Sig Dispense Refill  . amLODipine (NORVASC) 10 MG tablet TAKE 1 TABLET BY MOUTH DAILY 90 tablet 2  . aspirin 81 MG tablet Take 81 mg by mouth daily.    . Blood Glucose Monitoring Suppl (ACCU-CHEK AVIVA PLUS) W/DEVICE KIT Use as directed to check blood sugar.  Diagnosis code E11.9 1 kit 0  . carvedilol (COREG) 3.125 MG tablet Take 1 tablet (3.125 mg total) by mouth 2 (two) times daily with a meal. 60 tablet 11  . cloNIDine (CATAPRES) 0.2 MG tablet Take 0.2 mg by mouth 2 (two) times daily.    . clopidogrel (PLAVIX) 75 MG tablet Take 1 tablet (75 mg total) by mouth every morning. 90 tablet 3  . escitalopram (LEXAPRO) 10 MG tablet Take 10 mg by mouth daily.    . famotidine (PEPCID) 40 MG tablet Take 40 mg by mouth 2 (two) times daily.    . furosemide (LASIX) 40 MG tablet TAKE 1 AND 1/2 TABLET BY MOUTH TWICE DAILY 270 tablet 0  . glucose blood (ACCU-CHEK AVIVA) test strip Use as directed three times daily to check blood sugar.  Diagnosis code E11.9 300 each 3  . isosorbide mononitrate (IMDUR) 30 MG 24 hr tablet Take 1.5 tablets (45 mg total) by mouth daily. 45 tablet 11  . Lancets (ACCU-CHEK MULTICLIX) lancets Use as directed three times daily to check blood sugar.  Diagnosis code E11.9 300 each 3  . losartan (COZAAR) 100 MG tablet TAKE 1 TABLET BY MOUTH EVERY DAY 90 tablet 0  .  losartan (COZAAR) 100 MG tablet Take 1 tablet (100 mg total) by mouth daily. 90 tablet 1  . metFORMIN (GLUCOPHAGE-XR) 500 MG 24 hr tablet 2 tabs by mouth in the am 180 tablet 3  . Multiple Vitamins-Minerals (MULTIVITAMIN WITH MINERALS) tablet Take 1 tablet by mouth daily. CENTRUM SILVER MENS  50 PLUS    . nitroGLYCERIN (NITROSTAT) 0.4 MG SL tablet Place 1 tablet (0.4 mg total) under the tongue every 5 (five) minutes as needed for chest pain. For chest pain 25 tablet 12  . potassium chloride SA (K-DUR,KLOR-CON) 20 MEQ tablet Take 1 tablet (20 mEq total) by mouth 2 (two) times daily. 180 tablet 0  . pravastatin (PRAVACHOL) 80 MG tablet TAKE 1 TABLET BY MOUTH EVERY EVENING 90 tablet 2   No current facility-administered medications for this visit.  Allergies:   Penicillins    Social History:  The patient  reports that he has been smoking Cigarettes.  He has a 15 pack-year smoking history. He does not have any smokeless tobacco history on file. He reports that he does not drink alcohol or use illicit drugs.   Family History:  The patient's family history includes Cancer in his mother; Coronary artery disease in an other family member; Depression in his mother; Hypertension in his mother; Other in his father. There is no history of Heart attack or Stroke.    ROS:  Please see the history of present illness.   Otherwise, review of systems are positive for none.   All other systems are reviewed and negative.    PHYSICAL EXAM: VS:  BP 125/80 mmHg  Pulse 64  Ht $R'6\' 1"'qp$  (1.854 m)  Wt 344 lb (156.037 kg)  BMI 45.40 kg/m2    Wt Readings from Last 3 Encounters:  07/16/14 344 lb (156.037 kg)  04/09/14 335 lb (151.955 kg)  03/25/14 335 lb 6 oz (152.125 kg)     GEN: Well nourished, well developed, in no acute distress HEENT: normal Neck: no JVD, no masses Cardiac:  Normal S1/S2, RRR; no murmur, no rubs or gallops, trace edema  Respiratory:  Decreased breath sounds bilaterally, no wheezing,  rhonchi or rales. GI: distended, + BS MS: no deformity or atrophy Skin: warm and dry  Neuro:  CNs II-XII intact, Strength and sensation are intact Psych: Normal affect   EKG:  EKG is not ordered today.  It demonstrates:   n/a   Recent Labs: 01/26/2014: ALT 25; TSH 0.97 03/12/2014: Pro B Natriuretic peptide (BNP) 229.0* 03/14/2014: BUN 17; Creatinine 1.02; Potassium 4.0; Sodium 140 03/15/2014: Hemoglobin 13.8; Platelets 165    Lipid Panel    Component Value Date/Time   CHOL 129 03/13/2014 0334   TRIG 88 03/13/2014 0334   HDL 44 03/13/2014 0334   CHOLHDL 2.9 03/13/2014 0334   VLDL 18 03/13/2014 0334   LDLCALC 67 03/13/2014 0334      ASSESSMENT AND PLAN:  1.  Coronary artery disease: He is overall doing well.  There was some confusion regarding his medications. He was taking both carvedilol 12.5 mg and carvedilol 3.125 mg. I have asked him to remain on carvedilol 12.5 mg twice a day. Stop carvedilol 3.125 mg. Increase isosorbide to 60 mg daily. 2. Chronic diastolic congestive heart failure:  He notes increasing shortness of breath. His weight is increased since his last visit. I suspect he is somewhat volume overloaded. Increase Lasix to 80 mg twice a day 3 days. Increase potassium to 20 mEq 3 times a day for 3 days. Check a basic metabolic panel and BNP today. Repeat a basic metabolic panel in one week. Consider continuing a higher dose of Lasix if his BNP is significantly elevated. 3. Essential hypertension, benign: BP controlled. continue current regimen of Norvasc, carvedilol, clonidine, isosorbide, losartan . 4. Hyperlipidemia: Recent LDL optimal. Continue statin. 5. Tobacco abuse: He understands the importance of cessation.  He has not yet been able to quit. 6. OSA (obstructive sleep apnea):  He would like to see Dr. Fransico Him here.  I will arrange FU.     Current medicines are reviewed at length with the patient today.  The patient has concerns regarding  medicines.  The following changes have been made:  As above.   Labs/ tests ordered today include:   Orders Placed This Encounter  Procedures  . Basic Metabolic Panel (  BMET)  . B Nat Peptide  . Basic Metabolic Panel (BMET)    Disposition:   FU with me in 4 weeks.  I will refer him to Dr. Fransico Him for management of his OSA.     Signed, Versie Starks, MHS 07/16/2014 10:25 AM    [facility] [place], [place], [place]  [address] Phone: [phone]; Fax: [phone]

## 2014-07-16 ENCOUNTER — Encounter: Payer: Self-pay | Admitting: Physician Assistant

## 2014-07-16 ENCOUNTER — Ambulatory Visit (INDEPENDENT_AMBULATORY_CARE_PROVIDER_SITE_OTHER): Payer: Commercial Managed Care - HMO | Admitting: Physician Assistant

## 2014-07-16 VITALS — BP 125/80 | HR 64 | Ht 73.0 in | Wt 344.0 lb

## 2014-07-16 DIAGNOSIS — Z720 Tobacco use: Secondary | ICD-10-CM

## 2014-07-16 DIAGNOSIS — E785 Hyperlipidemia, unspecified: Secondary | ICD-10-CM | POA: Diagnosis not present

## 2014-07-16 DIAGNOSIS — G4733 Obstructive sleep apnea (adult) (pediatric): Secondary | ICD-10-CM

## 2014-07-16 DIAGNOSIS — I5032 Chronic diastolic (congestive) heart failure: Secondary | ICD-10-CM

## 2014-07-16 DIAGNOSIS — I2510 Atherosclerotic heart disease of native coronary artery without angina pectoris: Secondary | ICD-10-CM | POA: Diagnosis not present

## 2014-07-16 DIAGNOSIS — I10 Essential (primary) hypertension: Secondary | ICD-10-CM | POA: Diagnosis not present

## 2014-07-16 LAB — BASIC METABOLIC PANEL
BUN: 15 mg/dL (ref 6–23)
CO2: 32 mEq/L (ref 19–32)
Calcium: 9.4 mg/dL (ref 8.4–10.5)
Chloride: 107 mEq/L (ref 96–112)
Creatinine, Ser: 1.24 mg/dL (ref 0.40–1.50)
GFR: 77.16 mL/min (ref >60.00)
Glucose, Bld: 107 mg/dL — ABNORMAL HIGH (ref 70–99)
POTASSIUM: 3.9 meq/L (ref 3.5–5.1)
Sodium: 143 mEq/L (ref 135–145)

## 2014-07-16 LAB — BRAIN NATRIURETIC PEPTIDE: Pro B Natriuretic peptide (BNP): 73 pg/mL (ref 0.0–100.0)

## 2014-07-16 MED ORDER — CARVEDILOL 12.5 MG PO TABS: 12.5000 mg | ORAL_TABLET | Freq: Two times a day (BID) | ORAL | Status: DC

## 2014-07-16 MED ORDER — ISOSORBIDE MONONITRATE ER 30 MG PO TB24: 60.0000 mg | ORAL_TABLET | Freq: Every day | ORAL | Status: DC

## 2014-07-16 NOTE — Patient Instructions (Addendum)
Your physician has recommended you make the following change in your medication:   1.  Increase Lasix to 80 mg (2 tablets) twice a day for 3 days.  Then resume your usual dose. 2.  Increase potassium to 20 mEq take one table 3 times a day for 3 days.  Then resume you usual dose. 3.  Increase Imdur (Isosorbide) to 60 mg daily.  Take 2 tablets of the 30 mg to = 60 mg.  A new prescription was sent in. 4.  Stop taking Coreg (Carvedilol) 3.125 mg. 5.  Continue taking Coreg (Carvedilol) 12.5 mg twice a day.   Labs today:  BMET, BNP.  Labs in 1 week:  BMET.   Refer to Dr. Fransico Him for management of CPAP.  Dx Sleep Apnea.  Schedule follow up with [name], PA-C in 3-4 weeks.

## 2014-07-19 ENCOUNTER — Telehealth: Payer: Self-pay | Admitting: *Deleted

## 2014-07-19 ENCOUNTER — Institutional Professional Consult (permissible substitution): Payer: Medicare HMO | Admitting: Cardiology

## 2014-07-19 NOTE — Telephone Encounter (Signed)
pt notified about lab results with verbal understanding  

## 2014-07-23 ENCOUNTER — Other Ambulatory Visit (INDEPENDENT_AMBULATORY_CARE_PROVIDER_SITE_OTHER): Payer: Commercial Managed Care - HMO | Admitting: *Deleted

## 2014-07-23 ENCOUNTER — Telehealth: Payer: Self-pay | Admitting: *Deleted

## 2014-07-23 DIAGNOSIS — I2510 Atherosclerotic heart disease of native coronary artery without angina pectoris: Secondary | ICD-10-CM

## 2014-07-23 LAB — BASIC METABOLIC PANEL
BUN: 19 mg/dL (ref 6–23)
CO2: 29 mEq/L (ref 19–32)
Calcium: 9.6 mg/dL (ref 8.4–10.5)
Chloride: 109 mEq/L (ref 96–112)
Creatinine, Ser: 1.29 mg/dL (ref 0.40–1.50)
GFR: 73.72 mL/min (ref >60.00)
Glucose, Bld: 107 mg/dL — ABNORMAL HIGH (ref 70–99)
POTASSIUM: 3.6 meq/L (ref 3.5–5.1)
Sodium: 142 mEq/L (ref 135–145)

## 2014-07-23 NOTE — Telephone Encounter (Signed)
pt notified about with verbal understanding

## 2014-07-26 ENCOUNTER — Other Ambulatory Visit: Payer: Self-pay | Admitting: Internal Medicine

## 2014-07-29 ENCOUNTER — Telehealth: Payer: Self-pay | Admitting: Physician Assistant

## 2014-07-29 DIAGNOSIS — I5032 Chronic diastolic (congestive) heart failure: Secondary | ICD-10-CM

## 2014-07-29 MED ORDER — NITROGLYCERIN 0.4 MG SL SUBL: 0.4000 mg | SUBLINGUAL_TABLET | SUBLINGUAL | Status: DC | PRN

## 2014-07-29 MED ORDER — POTASSIUM CHLORIDE CRYS ER 20 MEQ PO TBCR
20.0000 meq | EXTENDED_RELEASE_TABLET | Freq: Three times a day (TID) | ORAL | Status: DC
Start: 2014-07-29 — End: 2014-08-05

## 2014-07-29 MED ORDER — FUROSEMIDE 80 MG PO TABS: 80.0000 mg | ORAL_TABLET | Freq: Two times a day (BID) | ORAL | Status: DC

## 2014-07-29 NOTE — Telephone Encounter (Signed)
New Msg      Pt c/o Shortness Of Breath: STAT if SOB developed within the last 24 hours or pt is noticeably SOB on the phone  1. Are you currently SOB (can you hear that pt is SOB on the phone)? Pt states a little SOB now. (Did not notice any SOB while speaking with pt) 2. How long have you been experiencing SOB? Past two days  3. Are you SOB when sitting or when up moving around? both 4.  Are you currently experiencing any other symptoms? Swelling in feet and hands feels a little tight.   Per Pt, he has seen [name] last few visits and wants to get info to him.  Please return pt call.

## 2014-07-29 NOTE — Telephone Encounter (Signed)
Pt sts that he has had increase sob  over the last 2-3 days.He has  noticed some swelling in his hands and feet. He denies any other symptoms He usually weighs daily. He has not weighed in 2 days. His last weight on his home scale was 336lb. asked him to weigh while I waited on the phone his weight today was 337.6lb. He ambulated while on the phone with me and I did not notice him being sob. He did have to sit up to sleep last night. He has taken his 60mg  of lasix this morning and reports good urine output. Adv him that I will fwd an update to [facility] and call back with his recommendations. He agreed with plan.  Pt aware of Margaret Pyle recommendations Increase Lasix to 80mg  bid Increase Potassium to 20 meq tid Pt rqst refill for nitro Rx sent to pt pharmacy He is to call the office early next week with an update of symptoms He is to come to the office for a lab appt (bmet) on 08/05/14 Pt verbalized understanding

## 2014-08-05 ENCOUNTER — Other Ambulatory Visit (INDEPENDENT_AMBULATORY_CARE_PROVIDER_SITE_OTHER): Payer: Commercial Managed Care - HMO | Admitting: *Deleted

## 2014-08-05 ENCOUNTER — Other Ambulatory Visit: Payer: Self-pay

## 2014-08-05 DIAGNOSIS — I5032 Chronic diastolic (congestive) heart failure: Secondary | ICD-10-CM | POA: Diagnosis not present

## 2014-08-05 LAB — BASIC METABOLIC PANEL
BUN: 16 mg/dL (ref 6–23)
CALCIUM: 9.3 mg/dL (ref 8.4–10.5)
CO2: 33 mEq/L — ABNORMAL HIGH (ref 19–32)
CREATININE: 1.38 mg/dL (ref 0.40–1.50)
Chloride: 107 mEq/L (ref 96–112)
GFR: 68.19 mL/min (ref >60.00)
GLUCOSE: 112 mg/dL — AB (ref 70–99)
Potassium: 3.5 mEq/L (ref 3.5–5.1)
Sodium: 143 mEq/L (ref 135–145)

## 2014-08-05 MED ORDER — POTASSIUM CHLORIDE CRYS ER 20 MEQ PO TBCR: 40.0000 meq | EXTENDED_RELEASE_TABLET | Freq: Two times a day (BID) | ORAL | Status: DC

## 2014-08-08 NOTE — Progress Notes (Signed)
Cardiology Office Note   Date:  08/09/2014   ID:  [name] [name], DOB 1955-12-27, MRN [id number]  PCP:  Cathlean Cower, MD  Cardiologist:  Dr. Sherren Mocha     Chief Complaint  Patient presents with  . Shortness of Breath  . Coronary Artery Disease  . Congestive Heart Failure     History of Present Illness: [name] [name] is a 59 y.o. male prior patient of mine from [place] with a hx of CAD, s/p PCI to the RCA and PL1, cutting balloon angioplasty secondary to in-stent restenosis of the RCA in 5/08, ischemic cardiomyopathy with previous EF 40% (improved to normal in the past), diastolic CHF, HTN, HL.   Admitted 08/2012 with an anterior STEMI treated with a Promus DES to the LAD. He had diffuse residual disease and medical therapy was recommended. EF was 35-40%. Follow up demonstrated improved LVF with EF 50-55%.   Admitted 11/2013 with NSTEMI. Severe stenosis OM1 treated with PCI (2.75x15 mm Xience Alpine DES).  Admitted 03/2014 with NSTEMI. LHC demonstrated severe disease in the small distal LCx and OM2, moderate disease in the mid RCA and distal LAD. This was not changed significantly since his prior study and medical therapy was continued. He had BB reduced 2/2 bradycardia.    I saw him 3-4 weeks ago.  I adjusted his Lasix for a few days.  He had called in recently with increasing SOB.  We increased his Lasix again.  He returns for FU.  He remains short of breath..  He feels like it is improved since increasing his Lasix.  However, he feels like his breathing is overall getting worse.  NYHA 2b.  He denies chest pain.  He does not some indigestion at times.  He denies orthopnea, PND, edema. He denies syncope.  He is still smoking.  He says he was stressed last night and smoke a lot of cigarettes.     Studies: - LHC (10/15): Proximal LAD ectatic, mid LAD stent patent, distal LAD 70% at the apex, proximal and mid circumflex diffuse ectasia, small OM1 with CTO, OM2 stent patent,  distal OM2 90%, OM3 30-40%, very distal circumflex 90%, proximal to mid RCA stent patent, mid RCA stent 60-70% ISR, EF 50%, inferobasal akinesis - Echo (6/15): Mild LVH. EF 50% to 55%. Wall motion was normal. Grade 1 diastolic dysfunction. Left atrium: The atrium was mildly dilated. Right ventricle: The cavity size was moderately dilated. Right atrium: The atrium was mildly to moderately dilated. - Myoview 9/12: mod scar in base/mid inf and IL segments, slight peri-infarct ischemia - low risk (med Rx continued). - Carotid US (4/13): Bilateral: no ICA stenosis    Past Medical History  Diagnosis Date  . Dyslipidemia   . HTN (hypertension)   . CAD (coronary artery disease)     a. s/p PCI/BMS pRCA 2002; b. PCI pRCA & DES p/m RCA 2006; c. PCI/DES OM4 2007; d. PCI/CBA to RCA for ISR 10/2006; e.08/2012 STEMI/Cath/PCI: LM nl, LAD95-65m(3.0x20 Promus Prem DES), 95/95 ap LAD (1.0-1.579m, LCX 30p 95d (sm), LPL1 70-80p, patent stent, LPL2 nl, RCA 30p, 40/70 ISR, EF 35-40%.  . Ischemic cardiomyopathy     a. EF 40%; improved to normal;  b. 08/2012 Echo: EF 50-55%, mod LVH.  . Marland KitchenERD (gastroesophageal reflux disease)   . Tobacco abuse   . Obesity   . OSA (obstructive sleep apnea)     Does not use CPAP as of 05/2011  . Depression   . History of pneumonia   .  Diabetes mellitus     a. A1c 8.8 08/2012->Metformin initiated. => b. A1c (9/14): 6.6  . Hyperlipidemia   . Myocardial infarction     Past Surgical History  Procedure Laterality Date  . Coronary stent placement  2009  . Coronary angioplasty with stent placement    . Left heart catheterization with coronary angiogram N/A 08/31/2012    Procedure: LEFT HEART CATHETERIZATION WITH CORONARY ANGIOGRAM;  Surgeon: Burnell Blanks, MD;  [facility] LAB;  Service: Cardiovascular;  Laterality: N/A;  . Percutaneous coronary stent intervention (pci-s) Right 08/31/2012    Procedure: PERCUTANEOUS CORONARY STENT INTERVENTION (PCI-S);  Surgeon:  Burnell Blanks, MD;  Location: [facility] CATH LAB;  Service: Cardiovascular;  Laterality: Right;  . Left heart catheterization with coronary angiogram N/A 11/16/2013    Procedure: LEFT HEART CATHETERIZATION WITH CORONARY ANGIOGRAM;  Surgeon: Blane Ohara, MD;  Location: [facility] CATH LAB;  Service: Cardiovascular;  Laterality: N/A;  . Percutaneous coronary stent intervention (pci-s)  11/16/2013    Procedure: PERCUTANEOUS CORONARY STENT INTERVENTION (PCI-S);  Surgeon: Blane Ohara, MD;  Location: [facility] CATH LAB;  Service: Cardiovascular;;  . Left heart catheterization with coronary angiogram N/A 03/15/2014    Procedure: LEFT HEART CATHETERIZATION WITH CORONARY ANGIOGRAM;  Surgeon: Peter M Martinique, MD;  Location: [facility] CATH LAB;  Service: Cardiovascular;  Laterality: N/A;     Current Outpatient Prescriptions  Medication Sig Dispense Refill  . amLODipine (NORVASC) 10 MG tablet TAKE 1 TABLET BY MOUTH DAILY 90 tablet 2  . aspirin 81 MG tablet Take 81 mg by mouth daily.    . Blood Glucose Monitoring Suppl (ACCU-CHEK AVIVA PLUS) W/DEVICE KIT Use as directed to check blood sugar.  Diagnosis code E11.9 1 kit 0  . carvedilol (COREG) 12.5 MG tablet Take 1 tablet (12.5 mg total) by mouth 2 (two) times daily. 180 tablet 3  . cloNIDine (CATAPRES) 0.2 MG tablet Take 0.2 mg by mouth 2 (two) times daily.    . clopidogrel (PLAVIX) 75 MG tablet Take 1 tablet (75 mg total) by mouth every morning. 90 tablet 3  . escitalopram (LEXAPRO) 10 MG tablet Take 10 mg by mouth daily.    . famotidine (PEPCID) 40 MG tablet Take 40 mg by mouth 2 (two) times daily.    . furosemide (LASIX) 80 MG tablet Take 1 tablet (80 mg total) by mouth 2 (two) times daily. 180 tablet 5  . glucose blood (ACCU-CHEK AVIVA) test strip Use as directed three times daily to check blood sugar.  Diagnosis code E11.9 300 each 3  . isosorbide mononitrate (IMDUR) 30 MG 24 hr tablet Take 2 tablets (60 mg total) by mouth daily. 180 tablet 3  . Lancets (ACCU-CHEK  MULTICLIX) lancets Use as directed three times daily to check blood sugar.  Diagnosis code E11.9 300 each 3  . losartan (COZAAR) 100 MG tablet TAKE 1 TABLET BY MOUTH EVERY DAY 90 tablet 0  . metFORMIN (GLUCOPHAGE-XR) 500 MG 24 hr tablet 2 tabs by mouth in the am 180 tablet 3  . Multiple Vitamins-Minerals (MULTIVITAMIN WITH MINERALS) tablet Take 1 tablet by mouth daily. CENTRUM SILVER MENS  50 PLUS    . nitroGLYCERIN (NITROSTAT) 0.4 MG SL tablet Place 1 tablet (0.4 mg total) under the tongue every 5 (five) minutes as needed for chest pain. For chest pain 25 tablet 2  . potassium chloride SA (K-DUR,KLOR-CON) 20 MEQ tablet Take 2 tablets (40 mEq total) by mouth 2 (two) times daily. 270 tablet 5  . pravastatin (  PRAVACHOL) 80 MG tablet TAKE 1 TABLET BY MOUTH EVERY EVENING 90 tablet 2   No current facility-administered medications for this visit.    Allergies:   Penicillins    Social History:  The patient  reports that he has been smoking Cigarettes.  He has a 15 pack-year smoking history. He does not have any smokeless tobacco history on file. He reports that he does not drink alcohol or use illicit drugs.   Family History:  The patient's family history includes Cancer in his mother; Coronary artery disease in an other family member; Depression in his mother; Hypertension in his mother; Other in his father. There is no history of Heart attack or Stroke.    ROS:  Please see the history of present illness.    Review of Systems  Constitution: Negative for chills and fever.  Respiratory: Negative for cough.   Hematologic/Lymphatic: Negative for bleeding problem.  Gastrointestinal: Positive for heartburn. Negative for hematochezia, melena, nausea and vomiting.  All other systems reviewed and are negative.      PHYSICAL EXAM: VS:  BP 134/84 mmHg  Pulse 61  Ht _0  (1.854 m)  Wt 341 lb (154.677 kg)  BMI 45.00 kg/m2    Wt Readings from Last 3 Encounters:  08/09/14 341 lb (154.677 kg)    07/16/14 344 lb (156.037 kg)  04/09/14 335 lb (151.955 kg)     GEN: Well nourished, well developed, in no acute distress HEENT: normal Neck: no JVD, no masses Cardiac:  Normal S1/S2, RRR; no murmur, no rubs or gallops, no edema  Respiratory:  Decreased breath sounds bilaterally, no wheezing, rhonchi or rales. GI: distended, + BS MS: no deformity or atrophy Skin: warm and dry  Neuro:  CNs II-XII intact, Strength and sensation are intact Psych: Normal affect   EKG:  EKG is ordered today.  It demonstrates:   NSR, HR 61, inf Q waves, RBBB, no significant change from prior tracings.    Recent Labs: 01/26/2014: ALT 25; TSH 0.97 03/15/2014: Hemoglobin 13.8; Platelets 165 07/16/2014: Pro B Natriuretic peptide (BNP) 73.0 08/05/2014: BUN 16; Creatinine 1.38; Potassium 3.5; Sodium 143   Recent Labs  07/16/14 1112 07/23/14 1231 08/05/14 1349  K 3.9 3.6 3.5  BUN _1 CREATININE 1.24 1.29 1.38     Lipid Panel    Component Value Date/Time   CHOL 129 03/13/2014 0334   TRIG 88 03/13/2014 0334   HDL 44 03/13/2014 0334   CHOLHDL 2.9 03/13/2014 0334   VLDL 18 03/13/2014 0334   LDLCALC 67 03/13/2014 0334      ASSESSMENT AND PLAN:  1.  Coronary artery disease: No clear angina.  I question if his dyspnea is related to the small vessel and distal vessel disease noted on LHC in 03/2014.  Overall, I suspect his dyspnea is multifactorial and related to CAD, CHF, obesity and smoking. ECG today is unchanged.  I will adjust his antianginals further.      -  Increase Imdur to 90 mg QD.      -  Continue Amlodipine, ASA, beta blocker, Plavix, statin. 2. Chronic diastolic congestive heart failure:  volume appears stable.  Continue current Rx.  Check BMET today.  3. Essential hypertension, benign: fair control.  Continue current regimen of Norvasc, carvedilol, clonidine, isosorbide, losartan . 4. Hyperlipidemia: Recent LDL optimal. Continue statin. 5. Tobacco abuse: we again  discussed the importance of cessation.  He understands this. He has not yet been able to quit. 6. OSA (obstructive  sleep apnea):  He sees Dr. Fransico Him in March.   Current medicines are reviewed at length with the patient today.  The patient does not have concerns regarding medicines.  The following changes have been made:  As above.   Labs/ tests ordered today include:   No orders of the defined types were placed in this encounter.    Disposition:   FU with Dr. Sherren Mocha in 6-8 weeks.   Signed, Versie Starks, MHS 08/09/2014 8:55 AM    [facility] [place], [place], [place]  [address] Phone: [phone]; Fax: [phone]

## 2014-08-09 ENCOUNTER — Ambulatory Visit (INDEPENDENT_AMBULATORY_CARE_PROVIDER_SITE_OTHER): Payer: Commercial Managed Care - HMO | Admitting: Physician Assistant

## 2014-08-09 ENCOUNTER — Telehealth: Payer: Self-pay | Admitting: *Deleted

## 2014-08-09 ENCOUNTER — Encounter: Payer: Self-pay | Admitting: Physician Assistant

## 2014-08-09 VITALS — BP 134/84 | HR 61 | Ht 73.0 in | Wt 341.0 lb

## 2014-08-09 DIAGNOSIS — Z720 Tobacco use: Secondary | ICD-10-CM | POA: Diagnosis not present

## 2014-08-09 DIAGNOSIS — I10 Essential (primary) hypertension: Secondary | ICD-10-CM

## 2014-08-09 DIAGNOSIS — I5032 Chronic diastolic (congestive) heart failure: Secondary | ICD-10-CM

## 2014-08-09 DIAGNOSIS — I2510 Atherosclerotic heart disease of native coronary artery without angina pectoris: Secondary | ICD-10-CM

## 2014-08-09 DIAGNOSIS — G4733 Obstructive sleep apnea (adult) (pediatric): Secondary | ICD-10-CM | POA: Diagnosis not present

## 2014-08-09 DIAGNOSIS — E785 Hyperlipidemia, unspecified: Secondary | ICD-10-CM

## 2014-08-09 LAB — BASIC METABOLIC PANEL
BUN: 17 mg/dL (ref 6–23)
CHLORIDE: 108 meq/L (ref 96–112)
CO2: 30 mEq/L (ref 19–32)
Calcium: 9.7 mg/dL (ref 8.4–10.5)
Creatinine, Ser: 1.19 mg/dL (ref 0.40–1.50)
GFR: 80.9 mL/min (ref >60.00)
Glucose, Bld: 169 mg/dL — ABNORMAL HIGH (ref 70–99)
Potassium: 3.9 mEq/L (ref 3.5–5.1)
SODIUM: 143 meq/L (ref 135–145)

## 2014-08-09 MED ORDER — ISOSORBIDE MONONITRATE ER 30 MG PO TB24: 90.0000 mg | ORAL_TABLET | Freq: Every day | ORAL | Status: DC

## 2014-08-09 NOTE — Patient Instructions (Signed)
INCREASE IMDUR TO 90 MG DAILY (THIS WILL BE 3 TABLETS DAILY); NEW RX SENT IN FOR THE NEW DIRECTIONS  LAB WORK TODAY; BMET  FOLLOW UP WITH DR. [name] IN 6-8 WEEKS

## 2014-08-09 NOTE — Telephone Encounter (Signed)
pt notified about lab results with verbal understanding  

## 2014-08-12 ENCOUNTER — Other Ambulatory Visit: Payer: Commercial Managed Care - HMO

## 2014-08-20 ENCOUNTER — Ambulatory Visit: Payer: Commercial Managed Care - HMO | Admitting: Cardiology

## 2014-08-24 ENCOUNTER — Telehealth: Payer: Self-pay

## 2014-08-24 NOTE — Telephone Encounter (Signed)
Call to fup regarding flu shot this season and rec'd message that the patient is not taking calls per his voice mail.

## 2014-08-26 ENCOUNTER — Encounter: Payer: Self-pay | Admitting: Cardiology

## 2014-09-07 ENCOUNTER — Other Ambulatory Visit: Payer: Self-pay

## 2014-09-08 ENCOUNTER — Other Ambulatory Visit: Payer: Self-pay | Admitting: Internal Medicine

## 2014-09-08 NOTE — Progress Notes (Signed)
Cardiology Office Note   Date:  09/09/2014   ID:  [name] [name], DOB Jan 21, 1956, MRN [id number]  PCP:  Oliver Barre, MD    Chief Complaint  Patient presents with  . Sleep Apnea  . Hypertension      History of Present Illness: [name] [name] is a 59 y.o. male who presents for evaluation of obstructive sleep apnea. The patient has a history of OSA and had been on CPAP but stopped it 05/2011.  He says that he has been told that snore.  He occasionally wakes up in the middle of the night usually to go to the bathroom.  He feels very tired when he gets up in the am.  He does get sleepy during the day and can take a nap but he is able to stay awake if he needs to.  He used to wake up SOB but that has gotten better.  He says that he knows that he needs to get back on the CPAP.  He has tried the full face mask in the past.  He is a mouth breather.  He walks dogs for exercise.    Past Medical History  Diagnosis Date  . Dyslipidemia   . HTN (hypertension)   . CAD (coronary artery disease)     a. s/p PCI/BMS pRCA 2002; b. PCI pRCA & DES p/m RCA 2006; c. PCI/DES OM4 2007; d. PCI/CBA to RCA for ISR 10/2006; e.08/2012 STEMI/Cath/PCI: LM nl, LAD95-62m (3.0x20 Promus Prem DES), 95/95 ap LAD (1.0-1.31mm), LCX 30p 95d (sm), LPL1 70-80p, patent stent, LPL2 nl, RCA 30p, 40/70 ISR, EF 35-40%.  . Ischemic cardiomyopathy     a. EF 40%; improved to normal;  b. 08/2012 Echo: EF 50-55%, mod LVH.  [name] GERD (gastroesophageal reflux disease)   . Tobacco abuse   . Obesity   . OSA (obstructive sleep apnea)     Does not use CPAP as of 05/2011  . Depression   . History of pneumonia   . Diabetes mellitus     a. A1c 8.8 08/2012->Metformin initiated. => b. A1c (9/14): 6.6  . Hyperlipidemia   . Myocardial infarction     Past Surgical History  Procedure Laterality Date  . Coronary stent placement  2009  . Coronary angioplasty with stent placement    . Left heart catheterization with coronary angiogram N/A  08/31/2012    Procedure: LEFT HEART CATHETERIZATION WITH CORONARY ANGIOGRAM;  Surgeon: Kathleene Hazel, MD;  Location: [facility] CATH LAB;  Service: Cardiovascular;  Laterality: N/A;  . Percutaneous coronary stent intervention (pci-s) Right 08/31/2012    Procedure: PERCUTANEOUS CORONARY STENT INTERVENTION (PCI-S);  Surgeon: Kathleene Hazel, MD;  Location: [facility] CATH LAB;  Service: Cardiovascular;  Laterality: Right;  . Left heart catheterization with coronary angiogram N/A 11/16/2013    Procedure: LEFT HEART CATHETERIZATION WITH CORONARY ANGIOGRAM;  Surgeon: Micheline Chapman, MD;  Location: [facility] CATH LAB;  Service: Cardiovascular;  Laterality: N/A;  . Percutaneous coronary stent intervention (pci-s)  11/16/2013    Procedure: PERCUTANEOUS CORONARY STENT INTERVENTION (PCI-S);  Surgeon: Micheline Chapman, MD;  Location: [facility] CATH LAB;  Service: Cardiovascular;;  . Left heart catheterization with coronary angiogram N/A 03/15/2014    Procedure: LEFT HEART CATHETERIZATION WITH CORONARY ANGIOGRAM;  Surgeon: Peter M Swaziland, MD;  Location: [facility] CATH LAB;  Service: Cardiovascular;  Laterality: N/A;     Current Outpatient Prescriptions  Medication Sig Dispense Refill  . amLODipine (NORVASC) 10 MG tablet TAKE 1 TABLET BY MOUTH DAILY 90 tablet  2  . aspirin 81 MG tablet Take 81 mg by mouth daily.    . Blood Glucose Monitoring Suppl (ACCU-CHEK AVIVA PLUS) W/DEVICE KIT Use as directed to check blood sugar.  Diagnosis code E11.9 1 kit 0  . carvedilol (COREG) 12.5 MG tablet Take 1 tablet (12.5 mg total) by mouth 2 (two) times daily. 180 tablet 3  . cloNIDine (CATAPRES) 0.2 MG tablet Take 0.2 mg by mouth 2 (two) times daily.    . clopidogrel (PLAVIX) 75 MG tablet Take 1 tablet (75 mg total) by mouth every morning. 90 tablet 3  . escitalopram (LEXAPRO) 10 MG tablet Take 10 mg by mouth daily.    . famotidine (PEPCID) 40 MG tablet Take 40 mg by mouth every 12 (twelve) hours.     . furosemide (LASIX) 80 MG tablet Take 80 mg  by mouth 2 (two) times daily.    [name] glucose blood (ACCU-CHEK AVIVA) test strip Use as directed three times daily to check blood sugar.  Diagnosis code E11.9 300 each 3  . isosorbide mononitrate (IMDUR) 30 MG 24 hr tablet Take 3 tablets (90 mg total) by mouth daily. 120 tablet 6  . losartan (COZAAR) 100 MG tablet TAKE 1 TABLET BY MOUTH EVERY DAY 90 tablet 0  . metFORMIN (GLUCOPHAGE-XR) 500 MG 24 hr tablet 2 tabs by mouth in the am (Patient taking differently: Take 2 tablets by mouth daily in the am) 180 tablet 3  . Multiple Vitamins-Minerals (MULTIVITAMIN WITH MINERALS) tablet Take 1 tablet by mouth daily. CENTRUM SILVER MENS  50 PLUS    . nitroGLYCERIN (NITROSTAT) 0.4 MG SL tablet Place 1 tablet (0.4 mg total) under the tongue every 5 (five) minutes as needed for chest pain. For chest pain 25 tablet 2  . potassium chloride SA (K-DUR,KLOR-CON) 20 MEQ tablet Take 2 tablets (40 mEq total) by mouth 2 (two) times daily. 270 tablet 5  . pravastatin (PRAVACHOL) 80 MG tablet TAKE 1 TABLET BY MOUTH EVERY EVENING 90 tablet 2  . Lancets (ACCU-CHEK MULTICLIX) lancets Use as directed three times daily to check blood sugar.  Diagnosis code E11.9 300 each 3   No current facility-administered medications for this visit.    Allergies:   Penicillins    Social History:  The patient  reports that he has been smoking Cigarettes.  He has a 15 pack-year smoking history. He does not have any smokeless tobacco history on file. He reports that he does not drink alcohol or use illicit drugs.   Family History:  The patient's family history includes Cancer in his mother; Coronary artery disease in an other family member; Depression in his mother; Hypertension in his mother; Other in his father. There is no history of Heart attack or Stroke.    ROS:  Please see the history of present illness.   Otherwise, review of systems are positive for none.   All other systems are reviewed and negative.    PHYSICAL EXAM: VS:  BP  132/88 mmHg  Pulse 81  Ht 6' 2.5" (1.892 m)  Wt 342 lb 6.4 oz (155.312 kg)  BMI 43.39 kg/m2  SpO2 93% , BMI Body mass index is 43.39 kg/(m^2). GEN: Well nourished, well developed, in no acute distress HEENT: normal Neck: no JVD, carotid bruits, or masses Cardiac: RRR; no murmurs, rubs, or gallops,no edema  Respiratory:  clear to auscultation bilaterally, normal work of breathing GI: soft, nontender, nondistended, + BS MS: no deformity or atrophy Skin: warm and dry, no rash Neuro:  Strength and sensation are intact Psych: euthymic mood, full affect   EKG:  EKG is not ordered today.    Recent Labs: 01/26/2014: ALT 25; TSH 0.97 03/15/2014: Hemoglobin 13.8; Platelets 165 07/16/2014: Pro B Natriuretic peptide (BNP) 73.0 08/09/2014: BUN 17; Creatinine 1.19; Potassium 3.9; Sodium 143    Lipid Panel    Component Value Date/Time   CHOL 129 03/13/2014 0334   TRIG 88 03/13/2014 0334   HDL 44 03/13/2014 0334   CHOLHDL 2.9 03/13/2014 0334   VLDL 18 03/13/2014 0334   LDLCALC 67 03/13/2014 0334      Wt Readings from Last 3 Encounters:  09/09/14 342 lb 6.4 oz (155.312 kg)  08/09/14 341 lb (154.677 kg)  07/16/14 344 lb (156.037 kg)      ASSESSMENT AND PLAN:  1.  Obstructive sleep apnea/hypopnea syndrome - he had been on CPAP in the past but quit in 2012.  He will need a repeat split night PSG to determine if significant sleep apnea is still present.  2  HTN - controlled on amlodipine/BB/ARB 3.  CAD -  Per Dr .[name] 4.  Morbid obesity - I have encouraged him to be more aggressive with diet and exercise program   Current medicines are reviewed at length with the patient today.  The patient does not have concerns regarding medicines.  The following changes have been made:  no change  Labs/ tests ordered today include: see above assessment and plan No orders of the defined types were placed in this encounter.     Disposition:   FU with me after sleep  study   Signed, Sueanne Margarita, MD  09/09/2014 8:21 AM    [facility] [address], [place], [place]  [address] Phone: [phone]; Fax: [phone]

## 2014-09-09 ENCOUNTER — Ambulatory Visit (INDEPENDENT_AMBULATORY_CARE_PROVIDER_SITE_OTHER): Payer: Commercial Managed Care - HMO | Admitting: Cardiology

## 2014-09-09 ENCOUNTER — Encounter: Payer: Self-pay | Admitting: Cardiology

## 2014-09-09 VITALS — BP 132/88 | HR 81 | Ht 74.5 in | Wt 342.4 lb

## 2014-09-09 DIAGNOSIS — I2583 Coronary atherosclerosis due to lipid rich plaque: Secondary | ICD-10-CM

## 2014-09-09 DIAGNOSIS — G4733 Obstructive sleep apnea (adult) (pediatric): Secondary | ICD-10-CM

## 2014-09-09 DIAGNOSIS — I2510 Atherosclerotic heart disease of native coronary artery without angina pectoris: Secondary | ICD-10-CM | POA: Diagnosis not present

## 2014-09-09 DIAGNOSIS — I10 Essential (primary) hypertension: Secondary | ICD-10-CM

## 2014-09-09 NOTE — Patient Instructions (Signed)
Your physician has recommended that you have a sleep study. This test records several body functions during sleep, including: brain activity, eye movement, oxygen and carbon dioxide blood levels, heart rate and rhythm, breathing rate and rhythm, the flow of air through your mouth and nose, snoring, body muscle movements, and chest and belly movement.  Your physician recommends that you schedule a follow-up appointment AS NEEDED with Dr. Radford Pax pending your study.

## 2014-09-10 ENCOUNTER — Ambulatory Visit: Payer: Commercial Managed Care - HMO | Admitting: Cardiology

## 2014-09-15 ENCOUNTER — Other Ambulatory Visit: Payer: Self-pay

## 2014-09-15 DIAGNOSIS — I5032 Chronic diastolic (congestive) heart failure: Secondary | ICD-10-CM

## 2014-09-15 MED ORDER — POTASSIUM CHLORIDE CRYS ER 20 MEQ PO TBCR: 40.0000 meq | EXTENDED_RELEASE_TABLET | Freq: Two times a day (BID) | ORAL | Status: DC

## 2014-09-17 ENCOUNTER — Ambulatory Visit: Payer: Self-pay

## 2014-09-17 ENCOUNTER — Other Ambulatory Visit: Payer: Self-pay

## 2014-09-17 NOTE — Patient Outreach (Signed)
[facility] [facility]) Care Management  09/17/2014  [name] [name] 16-Feb-1956 [id number]   SUBJECTIVE:  Telephone call to patient for disease management follow up.  Patient request RNCM call him back on Monday.  Patient states he was out at this time.  PLAN:  RNCM will contact patient back on Monday 09/20/14 at patients request.   Quinn Plowman RN,BSN,CCM [place] Coordinator [phone]

## 2014-09-19 ENCOUNTER — Other Ambulatory Visit: Payer: Self-pay | Admitting: Internal Medicine

## 2014-09-20 ENCOUNTER — Other Ambulatory Visit: Payer: Self-pay

## 2014-09-20 DIAGNOSIS — I509 Heart failure, unspecified: Secondary | ICD-10-CM

## 2014-09-20 NOTE — Patient Outreach (Signed)
[place] [facility]) Care Management  09/20/2014  Zacharie Portner Oct 19, 1955 [id number]   SUBJECTIVE:  Telephone call to patient for disease management follow up. Patient unable to specify zone he was in.  RNCM reviewed zones with patient.  Patient reports he is presently in the Timmie Dugue zone.  States he was in the yellow zone earlier today,feeling very fatigued but much better now.  Patient denies any shortness of breath, swelling in lower extremities or chest discomfort.  Patient reports he kept appointment regarding cpap machine and states he has a follow up in June 2016.  Patient states he recently ran out of his prilosec but has called in a refill. Patient reports he is able to get and is taking all of his medications as prescribed by his doctor.  Patient reports has not weighed as requested by [name] from last outreach call on 08/18/14.  Patient states he will try to do better with this   ASSESSMENT:  Disease management congestive heart failure.  RNCM advised patient to start weighing at least 3 days per week and recording.   PLAN;  RNCM will outreach to  patient within 1 month Patient will weigh 3 times per week and record Patient will report utilizing suggested low sodium cooking solutions at next outreach.   Quinn Plowman RN,BSN,CCM [place] Coordinator [phone]

## 2014-09-22 ENCOUNTER — Encounter: Payer: Self-pay | Admitting: Cardiovascular Disease

## 2014-09-22 ENCOUNTER — Ambulatory Visit (INDEPENDENT_AMBULATORY_CARE_PROVIDER_SITE_OTHER): Payer: Commercial Managed Care - HMO | Admitting: Cardiovascular Disease

## 2014-09-22 VITALS — BP 120/82 | HR 76 | Ht 74.5 in | Wt 346.0 lb

## 2014-09-22 DIAGNOSIS — I5032 Chronic diastolic (congestive) heart failure: Secondary | ICD-10-CM

## 2014-09-22 DIAGNOSIS — E785 Hyperlipidemia, unspecified: Secondary | ICD-10-CM | POA: Diagnosis not present

## 2014-09-22 DIAGNOSIS — I10 Essential (primary) hypertension: Secondary | ICD-10-CM | POA: Diagnosis not present

## 2014-09-22 DIAGNOSIS — I2510 Atherosclerotic heart disease of native coronary artery without angina pectoris: Secondary | ICD-10-CM | POA: Diagnosis not present

## 2014-09-22 NOTE — Patient Instructions (Signed)
Medication Instructions:  Your physician recommends that you continue on your current medications as directed. Please refer to the Current Medication list given to you today.  Labwork: Your physician recommends that you return for a FASTING LIPID, LIVER and BMP in 6 MONTHS (prior to appointment with PA)--nothing to eat or drink after midnight, lab opens at 7:30 AM.   Testing/Procedures: No new orders.  Follow-Up: Your physician wants you to follow-up in: 6 MONTHS with [name] PA-C. You will receive a reminder letter in the mail two months in advance. If you don't receive a letter, please call our office to schedule the follow-up appointment.  Your physician wants you to follow-up in: 1 YEAR with Dr [name].  You will receive a reminder letter in the mail two months in advance. If you don't receive a letter, please call our office to schedule the follow-up appointment.  Any Other Special Instructions Will Be Listed Below (If Applicable).

## 2014-09-22 NOTE — Progress Notes (Signed)
Cardiology Office Note   Date:  09/22/2014   ID:  [name] [name], DOB July 21, 1955, MRN [id number]  PCP:  Cathlean Cower, MD  Cardiologist:  Sherren Mocha, MD    Chief Complaint  Patient presents with  . Fatigue    History of Present Illness: [name] [name] is a 59 y.o. male who presents for follow-up of coronary artery disease. The patient is undergone multiple PCI procedures over time. He has an ischemic cardiomyopathy with LVEF 40%, long-standing history of diastolic heart failure, poorly controlled hypertension, and hyperlipidemia. He has continued to smoke and has had poor lifestyle adherence despite long-standing history of cardiovascular disease.  The patient had an anterior STEMI in 2014 and was treated with a drug-eluting stent in the LAD. LVEF at that time was 35-40%, but improved with medical therapy to arrange of 50-55% on follow-up echo. He had non-ST elevation infarctions in June and October 2015. He underwent PCI of the first obtuse marginal in June 2015 with a drug-eluting stent. At the time of his admission in October, heart catheterization demonstrated stability of his coronary artery disease diffuse small vessel disease and medical therapy was recommended.  He's doing well at present. No chest pain or pressure. He does complain of 'lack of energy.' He rests frequently. No limitation from shortness of breath. No leg swelling, lightheadedness, or syncope. Fatigue is worse after he takes am medications, so he's pushed am meds back to 9am. He walks the dogs every morning before his medicines.   Past Medical History  Diagnosis Date  . Dyslipidemia   . HTN (hypertension)   . CAD (coronary artery disease)     a. s/p PCI/BMS pRCA 2002; b. PCI pRCA & DES p/m RCA 2006; c. PCI/DES OM4 2007; d. PCI/CBA to RCA for ISR 10/2006; e.08/2012 STEMI/Cath/PCI: LM nl, LAD95-66m (3.0x20 Promus Prem DES), 95/95 ap LAD (1.0-1.70mm), LCX 30p 95d (sm), LPL1 70-80p, patent stent, LPL2 nl, RCA 30p, 40/70  ISR, EF 35-40%.  . Ischemic cardiomyopathy     a. EF 40%; improved to normal;  b. 08/2012 Echo: EF 50-55%, mod LVH.  [name] GERD (gastroesophageal reflux disease)   . Tobacco abuse   . Obesity   . OSA (obstructive sleep apnea)     Does not use CPAP as of 05/2011  . Depression   . History of pneumonia   . Diabetes mellitus     a. A1c 8.8 08/2012->Metformin initiated. => b. A1c (9/14): 6.6  . Hyperlipidemia   . Myocardial infarction     Past Surgical History  Procedure Laterality Date  . Coronary stent placement  2009  . Coronary angioplasty with stent placement    . Left heart catheterization with coronary angiogram N/A 08/31/2012    Procedure: LEFT HEART CATHETERIZATION WITH CORONARY ANGIOGRAM;  Surgeon: Burnell Blanks, MD;  Location: [facility] CATH LAB;  Service: Cardiovascular;  Laterality: N/A;  . Percutaneous coronary stent intervention (pci-s) Right 08/31/2012    Procedure: PERCUTANEOUS CORONARY STENT INTERVENTION (PCI-S);  Surgeon: Burnell Blanks, MD;  Location: [facility] CATH LAB;  Service: Cardiovascular;  Laterality: Right;  . Left heart catheterization with coronary angiogram N/A 11/16/2013    Procedure: LEFT HEART CATHETERIZATION WITH CORONARY ANGIOGRAM;  Surgeon: Blane Ohara, MD;  Location: [facility] CATH LAB;  Service: Cardiovascular;  Laterality: N/A;  . Percutaneous coronary stent intervention (pci-s)  11/16/2013    Procedure: PERCUTANEOUS CORONARY STENT INTERVENTION (PCI-S);  Surgeon: Blane Ohara, MD;  Location: [facility] CATH LAB;  Service: Cardiovascular;;  . Left heart  catheterization with coronary angiogram N/A 03/15/2014    Procedure: LEFT HEART CATHETERIZATION WITH CORONARY ANGIOGRAM;  Surgeon: Peter M Martinique, MD;  Location: [facility] CATH LAB;  Service: Cardiovascular;  Laterality: N/A;    Current Outpatient Prescriptions  Medication Sig Dispense Refill  . amLODipine (NORVASC) 10 MG tablet Take 10 mg by mouth daily.    [name] aspirin 81 MG tablet Take 81 mg by mouth daily.    . Blood  Glucose Monitoring Suppl (ACCU-CHEK AVIVA PLUS) W/DEVICE KIT Use as directed to check blood sugar.  Diagnosis code E11.9 1 kit 0  . carvedilol (COREG) 3.125 MG tablet Take 3.125 mg by mouth 2 (two) times daily with a meal.   11  . cloNIDine (CATAPRES) 0.2 MG tablet Take 0.2 mg by mouth 2 (two) times daily.    . clopidogrel (PLAVIX) 75 MG tablet Take 1 tablet (75 mg total) by mouth every morning. 90 tablet 3  . escitalopram (LEXAPRO) 10 MG tablet Take 10 mg by mouth daily.    . famotidine (PEPCID) 40 MG tablet Take 40 mg by mouth every 12 (twelve) hours.     . furosemide (LASIX) 40 MG tablet Take 80 mg by mouth 2 (two) times daily.     [name] glucose blood (ACCU-CHEK AVIVA) test strip Use as directed three times daily to check blood sugar.  Diagnosis code E11.9 300 each 3  . isosorbide mononitrate (IMDUR) 30 MG 24 hr tablet Take 3 tablets (90 mg total) by mouth daily. 120 tablet 6  . Lancets (ACCU-CHEK MULTICLIX) lancets Use as directed three times daily to check blood sugar.  Diagnosis code E11.9 300 each 3  . losartan (COZAAR) 100 MG tablet TAKE 1 TABLET BY MOUTH EVERY DAY 90 tablet 0  . metFORMIN (GLUCOPHAGE-XR) 500 MG 24 hr tablet Take 500 mg by mouth daily with breakfast.    . Multiple Vitamins-Minerals (MULTIVITAMIN WITH MINERALS) tablet Take 1 tablet by mouth daily. CENTRUM SILVER MENS  50 PLUS    . nitroGLYCERIN (NITROSTAT) 0.4 MG SL tablet Place 1 tablet (0.4 mg total) under the tongue every 5 (five) minutes as needed for chest pain. For chest pain 25 tablet 2  . potassium chloride SA (K-DUR,KLOR-CON) 20 MEQ tablet Take 2 tablets (40 mEq total) by mouth 2 (two) times daily. 270 tablet 5  . pravastatin (PRAVACHOL) 80 MG tablet TAKE 1 TABLET BY MOUTH EVERY EVENING 90 tablet 2   No current facility-administered medications for this visit.   Allergies:   Penicillins   Social History:  The patient  reports that he has been smoking Cigarettes.  He has a 15 pack-year smoking history. He does not  have any smokeless tobacco history on file. He reports that he does not drink alcohol or use illicit drugs.   Family History:  The patient's  family history includes Cancer in his mother; Coronary artery disease in an other family member; Depression in his mother; Hypertension in his mother; Other in his father. There is no history of Heart attack or Stroke.    ROS:  Please see the history of present illness.  Otherwise, review of systems is positive for fatigue and shortness of breath.  All other systems are reviewed and negative.    PHYSICAL EXAM: VS:  BP 120/82 mmHg  Pulse 76  Ht 6' 2.5" (1.892 m)  Wt 346 lb (156.945 kg)  BMI 43.84 kg/m2  SpO2 97% , BMI Body mass index is 43.84 kg/(m^2). GEN: Well nourished, well developed, in no acute distress  HEENT: normal Neck: no JVD, no masses. No carotid bruits Cardiac: RRR without murmur or gallop                Respiratory:  clear to auscultation bilaterally, normal work of breathing GI: soft, nontender, nondistended, + BS MS: no deformity or atrophy Ext: no pretibial edema, pedal pulses 2+= bilaterally Skin: warm and dry, no rash Neuro:  Strength and sensation are intact Psych: euthymic mood, full affect  EKG:  EKG is not ordered today.  Recent Labs: 01/26/2014: ALT 25; TSH 0.97 03/15/2014: Hemoglobin 13.8; Platelets 165 07/16/2014: Pro B Natriuretic peptide (BNP) 73.0 08/09/2014: BUN 17; Creatinine 1.19; Potassium 3.9; Sodium 143   Lipid Panel     Component Value Date/Time   CHOL 129 03/13/2014 0334   TRIG 88 03/13/2014 0334   HDL 44 03/13/2014 0334   CHOLHDL 2.9 03/13/2014 0334   VLDL 18 03/13/2014 0334   LDLCALC 67 03/13/2014 0334      Wt Readings from Last 3 Encounters:  09/22/14 346 lb (156.945 kg)  09/09/14 342 lb 6.4 oz (155.312 kg)  08/09/14 341 lb (154.677 kg)     Cardiac Studies Reviewed: 2D Echo 6.9.2015: Study Conclusions  - Left ventricle: The cavity size was mildly dilated. Wall thickness was increased  in a pattern of mild LVH. Systolic function was normal. The estimated ejection fraction was in the range of 50% to 55%. Wall motion was normal; there were no regional wall motion abnormalities. Doppler parameters are consistent with abnormal left ventricular relaxation (grade 1 diastolic dysfunction). - Left atrium: The atrium was mildly dilated. - Right ventricle: The cavity size was moderately dilated. - Right atrium: The atrium was mildly to moderately dilated.  ASSESSMENT AND PLAN: 1.  CAD, native vessel, without symptoms of angina: The patient is stable on his current medical program. He was counseled extensively about the importance of complete tobacco cessation and weight loss. He will continue on long-term dual antiplatelet therapy after multiple ACS episodes.  2. Essential hypertension: Blood pressure is well controlled on multi drug therapy with carvedilol, clonidine, isosorbide, losartan. Will repeat a renal panel before his visit in 6 months. Labs from February were reviewed with normal renal function noted.  3. Hyperlipidemia: He continues on pravastatin 80 mg. Repeat lipids prior to next office visit. Last lipids reviewed as above and he is at goal.  4. Tobacco abuse: Cessation counseling done. He is going to try nicotine gum.  5. Obesity: His weight is up to 346 pounds. He was counseled about specific weight loss strategies with increased exercise and dietary changes.   Current medicines are reviewed with the patient today.  The patient does not have concerns regarding medicines.  The following changes have been made:  no change  Labs/ tests ordered today include:   Orders Placed This Encounter  Procedures  . Basic Metabolic Panel (BMET)  . Lipid panel  . Hepatic function panel  . EKG 12-Lead    Disposition:   FU [name], PA-C 6 months  Signed, Sherren Mocha, MD  09/22/2014 2:[address],  [place], [place]  [address] Phone: [phone]; Fax: [phone]

## 2014-10-01 ENCOUNTER — Other Ambulatory Visit: Payer: Self-pay | Admitting: Internal Medicine

## 2014-10-07 ENCOUNTER — Other Ambulatory Visit: Payer: Self-pay | Admitting: Cardiovascular Disease

## 2014-10-07 ENCOUNTER — Other Ambulatory Visit: Payer: Self-pay | Admitting: Physician Assistant

## 2014-10-08 ENCOUNTER — Other Ambulatory Visit: Payer: Self-pay

## 2014-10-08 ENCOUNTER — Ambulatory Visit: Payer: Commercial Managed Care - HMO

## 2014-10-08 NOTE — Patient Outreach (Signed)
[facility]) Care Management  10/08/2014  Gionni Vaca [birth year] [id number]  Second telephone outreach to patient for disease management follow up.  Unable to reach patient. HIPAA compliant voice message left with call back phone number.    PLAN;  RNCM will attempt telephone outreach call to patient within 3 business days.   Quinn Plowman RN,BSN,CCM [place] Coordinator [phone]

## 2014-10-08 NOTE — Patient Outreach (Signed)
[place] [facility]) Care Management  10/08/2014  Neal Trulson [birth year] [id number]   Telephone call to patient regarding disease management update.  Unable to reach patient.  HIPAA compliant voice message left with call back phone number.   PLAN:  RNCM will attempt 2nd telephone outreach to patient within 3 business days.   Quinn Plowman RN,BSN,CCM [place] Coordinator [phone]

## 2014-10-11 ENCOUNTER — Other Ambulatory Visit: Payer: Self-pay

## 2014-10-11 ENCOUNTER — Ambulatory Visit: Payer: Self-pay

## 2014-10-11 DIAGNOSIS — I509 Heart failure, unspecified: Secondary | ICD-10-CM

## 2014-10-12 NOTE — Patient Outreach (Signed)
[place] [facility]) Care Management   10/12/2014  [name] [name] 1956/01/17 [id number]  [name] [name] is an 59 y.o. male  Subjective: Telephone call to patient regarding disease management follow up.  Patient states he is having a good day.  Patient reports he is in the [place] zone today.  Patient reports he is not checking his weight.  Patient confirms he has a working scale in his bathroom.   States he starts off good checking his weight then he falls off.  Patient denies swelling but states he has some mild shortness of breath.  Patient complains that he is very fatigued after taking his medications every day.  Patient states he usually has energy in the morning but once he takes his medications he becomes tired.  Patient reports he got sick approximately 1 week ago for 3 days with vomiting and diarrhea.  Patient states he is doing better but his stomach is still a little sensitive.  Patient reports he recently followed up with his cardiologist who adjusted his carvedilol dosage.  Patient states he has a few bottles of old medications that need to be discarded. Patient states they are non pain medication.     Objective:   ROS  Physical Exam       Fall/Depression Screening:    PHQ 2/9 Scores 10/11/2014 10/13/2013 06/26/2013  PHQ - 2 Score 5 1 1   PHQ- 9 Score 18 - -    Assessment:  Continued disease management.  Patient needs reinforcement regarding weighing and recording weights.   RNCM discussed importance of discarding old medications and ways to discard old medication safely.      Plan: RNCM will refer patient to health coach Health coach to follow up with patient within 1 month for continued heart failure disease management. Patient will call primary MD to schedule follow up appointment and report appointment date to health coach at next outreach.  Patient will report weighing and recording weights at least 3 days per week and report to health coach at next  outreach. Patient will verbalize at least 2 symptoms in the yellow zone heart failure action plan RNCM will send depression screening to primary MD RNCM will send primary MD involvement letter.

## 2014-10-15 ENCOUNTER — Other Ambulatory Visit: Payer: Self-pay

## 2014-10-15 NOTE — Addendum Note (Signed)
Addended by: Jone Baseman on: 10/15/2014 12:07 PM   Modules accepted: Miquel Dunn

## 2014-10-15 NOTE — Progress Notes (Signed)
This encounter was created in error - please disregard.

## 2014-10-15 NOTE — Addendum Note (Signed)
Addended by: Jone Baseman on: 10/15/2014 12:15 PM   Modules accepted: Level of Service, SmartSet

## 2014-10-15 NOTE — Patient Outreach (Signed)
[facility]) Care Management  10/15/2014  Famous Eisenhardt 08/09/1955 [id number]    Telephone call to patient to introduce self to patient.  Patient states he is doing well today no complaints.  Patient very receptive to call.  Discussed follow up appointment.  Patient agrees to next follow up appointment.  Plan: RN Health Coach will contact patient within the next month.    Jone Baseman, RN, MSN [place] Management [phone]

## 2014-10-15 NOTE — Patient Outreach (Signed)
[place] [facility]) Care Management  10/15/2014  [name] [name] [birth year] [id number]   Telephone call to patient to introduce self.  Patient states he is doing good today no complaints.  Discussed follow up telephone call.  Patient agrees to next telephone outreach within one month.    Plan: RN Health Coach will contact patient within one month.    Jone Baseman, RN, MSN [place] Management [phone]

## 2014-10-21 NOTE — Addendum Note (Signed)
Addended by: Jone Baseman on: 10/21/2014 03:21 PM   Modules accepted: Level of Service, SmartSet

## 2014-10-21 NOTE — Progress Notes (Signed)
This encounter was created in error - please disregard.

## 2014-11-10 ENCOUNTER — Ambulatory Visit: Payer: Commercial Managed Care - HMO

## 2014-11-10 ENCOUNTER — Other Ambulatory Visit: Payer: Self-pay

## 2014-11-10 NOTE — Patient Outreach (Signed)
[place] [facility]) Care Management  11/10/2014  [name] [name] December 27, 1955 [id number]   Telephone call to patient for disease management follow up.  Unable to reach.  HIPPA compliant voice message left.   Plan: RNCM will attempt telephone outreach within 1 week.     Jone Baseman, RN, MSN [place] [phone]

## 2014-11-16 ENCOUNTER — Other Ambulatory Visit: Payer: Self-pay

## 2014-11-16 NOTE — Patient Outreach (Signed)
[facility]) Care Management  11/16/2014  [name] [name] 1955-06-21 [id number]   Telephone call to patient for monthly outreach.  Patient asked if I would call him back on tomorrow as now is not a good time.  [name] will call him back tomorrow.  Jone Baseman, RN, MSN [place] [phone]

## 2014-11-17 ENCOUNTER — Other Ambulatory Visit: Payer: Self-pay

## 2014-11-17 NOTE — Patient Outreach (Signed)
[place] [facility]) Care Management  11/17/2014  Nellie Chevalier 06/18/1955 [id number]  Telephone call to patient for disease management follow up.  Unable to reach.  HIPPA compliant voice message left.   Plan: RN Health Coach will attempt telephone outreach within 1 week.    Jone Baseman, RN, MSN [place] [phone]

## 2014-11-19 ENCOUNTER — Ambulatory Visit (HOSPITAL_BASED_OUTPATIENT_CLINIC_OR_DEPARTMENT_OTHER): Payer: Commercial Managed Care - HMO | Attending: Cardiology

## 2014-11-19 VITALS — Ht 74.0 in | Wt 342.0 lb

## 2014-11-19 DIAGNOSIS — G4710 Hypersomnia, unspecified: Secondary | ICD-10-CM | POA: Diagnosis present

## 2014-11-19 DIAGNOSIS — G4733 Obstructive sleep apnea (adult) (pediatric): Secondary | ICD-10-CM | POA: Insufficient documentation

## 2014-11-19 DIAGNOSIS — I110 Hypertensive heart disease with heart failure: Secondary | ICD-10-CM

## 2014-11-19 DIAGNOSIS — R0683 Snoring: Secondary | ICD-10-CM | POA: Diagnosis not present

## 2014-11-23 ENCOUNTER — Ambulatory Visit: Payer: Commercial Managed Care - HMO

## 2014-11-23 ENCOUNTER — Other Ambulatory Visit: Payer: Self-pay

## 2014-11-23 NOTE — Patient Outreach (Signed)
[place] [facility]) Care Management  11/23/2014  [name] [name] [birth year] [id number]   Third telephone call to patient for monthly outreach. No answer.  HIPPA compliant voice message left.    Plan: RN Health Coach will send letter to attempt contact.    Jone Baseman, RN, MSN [place] [phone]

## 2014-11-27 ENCOUNTER — Telehealth: Payer: Self-pay | Admitting: Cardiology

## 2014-11-27 NOTE — Sleep Study (Signed)
NAME: [name] [name] DATE OF BIRTH:  October 08, 1955 MEDICAL RECORD NUMBER [id number]  LOCATION: [facility]  PHYSICIAN: Makana Feigel R  DATE OF STUDY: 11/19/2014  SLEEP STUDY TYPE:Split Night  Nocturnal Polysomnogram with CPAP titration               REFERRING PHYSICIAN: Sueanne Margarita, MD  INDICATION FOR STUDY: Snoring, non restorative sleep, excessive daytime sleepiness  EPWORTH SLEEPINESS SCORE: 6 HEIGHT: 6\' 2"  (188 cm)  WEIGHT: (!) 342 lb (155.13 kg)    Body mass index is 43.89 kg/(m^2).  NECK SIZE: 18 in.  MEDICATIONS: Reviewed in the chart  SLEEP ARCHITECTURE: During the diagnostic portion of the study, the patient slept for a total of 130 minutes out of a total sleep period time of 148 minutes.  There was no slow wave sleep and 22 minutes of REM sleep.  The onset to sleep latency was normal at 10 minutes.  The onset to REM sleep latency was normal at 79 minutes. The sleep efficiency was reduced at 82%.  During the CPAP titration, the patient slept for a total of 178 minutes out of a total sleep period time of 187 minutes.  There was no slow wave sleep and 99 minutes of REM sleep.  The onset to REM sleep latency was short at 38 minutes.  The sleep efficiency was normal at 90%.   RESPIRATORY DATA: During the diagnostic portion of the study, there were 81 apneas, of which, 35 were obstructive and 46 were mixed apneas.  There were 22 hypopneas.  Most events occurred in NREM sleep and all occurred in the non supine position.  The AHI was 48 events per hour consistent with severe obstructive sleep apnea/hypopnea syndrome.  There was severe snoring.  The patient was started on CPAP at 4cm H2O and titrated for respiratory events and snoring to 13cm H2O.  The patient was able to reach REM supine sleep at an optimum pressure of 12cm H2O and maintain this pressure for prolonged periods of time without further respiratory events.  The AHI at 12cm H2O was 3 events per hour.   Snoring was eliminated with CPAP.    OXYGEN DATA: The average oxygen saturation during the diagnostic portion of the study was 89%.  The lowest oxygen saturation was 74%.  The time spent with oxygen saturations < 88% was 20 minutes.  During the CPAP titration, the average oxygen saturation was 93%.  The lowest oxygen saturation was 85%.  The time spent with oxygen saturations < 88% was 0.5 minutes.    CARDIAC DATA: The patient maintained NSR with PAC's and PVC"s.  The average heart rate was 59-65 bpm.  The lowest heart rate was 30 bpm.    MOVEMENT/PARASOMNIA: There were nor periodic limb movements or REM sleep behavior disorders noted.  IMPRESSION/ RECOMMENDATION:   1.  Severe obstructive sleep apnea/hypopnea syndrome with an AHI of 48 events per hour.   Most events occurred in NREM sleep and all occurred in the non supine position.  2.  Severe snoring was noted that was eliminated with CPAP. 3.  Reduced sleep efficiency with increased frequency of arousals due to respiratory events. 4.  Oxygen desaturations as low as 74% were noted during the sleep study.  The time spent with oxygen saturations < 88% during the CPAP titration was 0.5 minutes.   5.  Abnormal sleep architecture with no slow wave sleep. 6.  Successful CPAP titration to 12cm H2O  The AHI was 3 events  per hour.  The patient was able to reach REM supine sleep at an optimum pressure of 12cm H2O and maintain this pressure for prolonged periods of time without further respiratory events.  7.  Recommend ResMed CPAP with C flex of 3, heated humidity and large, [employer] Simplus full face mask with CPAP set at 12cm H2O.   8.  Treatment would also include careful attention to proper sleep hygiene, weight reduction for elevated BMI, avoidance of sleeping in the supine position and avoidance of alcohol within four hours of bedtime.  Specific treatment decisions should be tailored to each patient based upon the clinical situation and all  treatment options should be considered.  The patient should be instructed to avoid driving if sleepy and careful clinical follow up is needed to ensure that the patient's symptoms are improving with therapy and the PAP adherence is supported and measured if prescribed.     Sueanne Margarita Diplomate, American Board of Sleep Medicine  ELECTRONICALLY SIGNED ON:  11/27/2014, 3:56 PM [place] PH: [phone]   FX: [phone] [place]

## 2014-11-27 NOTE — Addendum Note (Signed)
Addended by: Fransico Him R on: 11/27/2014 04:20 PM   Modules accepted: Orders

## 2014-11-27 NOTE — Telephone Encounter (Signed)
Please let patient know that they have significant sleep apnea and had successful CPAP titration and will be set up with CPAP unit.  Please let DME know that order is in EPIC.  Please set patient up for OV in 10 weeks 

## 2014-11-29 NOTE — Telephone Encounter (Signed)
Left message for patient to call.

## 2014-11-30 NOTE — Telephone Encounter (Signed)
Left message for patient to call.

## 2014-12-01 NOTE — Telephone Encounter (Signed)
Spoke with patient. He is aware of results.  [facility] has been sent a message. He does not want to use [facility] as his DME.     I will follow up with him in about a week, and once he gets his machine I will set up his 10 week appointment.

## 2014-12-07 ENCOUNTER — Other Ambulatory Visit: Payer: Self-pay | Admitting: Cardiovascular Disease

## 2014-12-07 NOTE — Patient Outreach (Signed)
[place] [facility]) Care Management  12/07/2014  Amay Mijangos 07-09-1955 [id number]   No response from patient after 3 outreach calls and letter.  Plan: RNCM will forward patient information to Lurline Del for case closure.   RNCM will send letter to primary care physician notifying of case closure.  Jone Baseman, RN, MSN [place] [phone]

## 2014-12-08 NOTE — Patient Outreach (Signed)
[place] [facility]) Care Management  12/08/2014  Branch Pacitti 04-07-1956 [id number]   Notification from Jon Billings, RN to close case due to unable to contact patient for [place] Management services.  Ronnell Freshwater. [place], [place] Management [place] Assistant Phone: [phone] Fax: [phone]

## 2014-12-09 NOTE — Telephone Encounter (Signed)
Per note:

## 2014-12-10 ENCOUNTER — Other Ambulatory Visit: Payer: Self-pay

## 2014-12-10 MED ORDER — LOSARTAN POTASSIUM 100 MG PO TABS: 100.0000 mg | ORAL_TABLET | Freq: Every day | ORAL | Status: DC

## 2014-12-22 DIAGNOSIS — G4733 Obstructive sleep apnea (adult) (pediatric): Secondary | ICD-10-CM | POA: Diagnosis not present

## 2015-01-22 DIAGNOSIS — G4733 Obstructive sleep apnea (adult) (pediatric): Secondary | ICD-10-CM | POA: Diagnosis not present

## 2015-02-11 ENCOUNTER — Observation Stay (HOSPITAL_COMMUNITY)
Admission: EM | Admit: 2015-02-11 | Discharge: 2015-02-12 | Disposition: A | Discharge status: Home / Self Care | Payer: Commercial Managed Care - HMO | Attending: Internal Medicine | Admitting: Internal Medicine

## 2015-02-11 ENCOUNTER — Emergency Department (HOSPITAL_COMMUNITY): Payer: Commercial Managed Care - HMO

## 2015-02-11 ENCOUNTER — Observation Stay (HOSPITAL_BASED_OUTPATIENT_CLINIC_OR_DEPARTMENT_OTHER): Payer: Commercial Managed Care - HMO

## 2015-02-11 ENCOUNTER — Encounter (HOSPITAL_COMMUNITY): Payer: Self-pay | Admitting: Emergency Medicine

## 2015-02-11 DIAGNOSIS — R0789 Other chest pain: Secondary | ICD-10-CM | POA: Diagnosis not present

## 2015-02-11 DIAGNOSIS — F17210 Nicotine dependence, cigarettes, uncomplicated: Secondary | ICD-10-CM | POA: Insufficient documentation

## 2015-02-11 DIAGNOSIS — F329 Major depressive disorder, single episode, unspecified: Secondary | ICD-10-CM | POA: Insufficient documentation

## 2015-02-11 DIAGNOSIS — Z79899 Other long term (current) drug therapy: Secondary | ICD-10-CM | POA: Insufficient documentation

## 2015-02-11 DIAGNOSIS — R0781 Pleurodynia: Principal | ICD-10-CM | POA: Insufficient documentation

## 2015-02-11 DIAGNOSIS — I10 Essential (primary) hypertension: Secondary | ICD-10-CM | POA: Diagnosis present

## 2015-02-11 DIAGNOSIS — Z23 Encounter for immunization: Secondary | ICD-10-CM | POA: Insufficient documentation

## 2015-02-11 DIAGNOSIS — Z7902 Long term (current) use of antithrombotics/antiplatelets: Secondary | ICD-10-CM | POA: Diagnosis not present

## 2015-02-11 DIAGNOSIS — I5032 Chronic diastolic (congestive) heart failure: Secondary | ICD-10-CM

## 2015-02-11 DIAGNOSIS — I252 Old myocardial infarction: Secondary | ICD-10-CM | POA: Diagnosis not present

## 2015-02-11 DIAGNOSIS — Z7982 Long term (current) use of aspirin: Secondary | ICD-10-CM | POA: Insufficient documentation

## 2015-02-11 DIAGNOSIS — E119 Type 2 diabetes mellitus without complications: Secondary | ICD-10-CM

## 2015-02-11 DIAGNOSIS — E66813 Obesity, class 3: Secondary | ICD-10-CM | POA: Diagnosis present

## 2015-02-11 DIAGNOSIS — Z6841 Body Mass Index (BMI) 40.0 and over, adult: Secondary | ICD-10-CM | POA: Insufficient documentation

## 2015-02-11 DIAGNOSIS — Z87891 Personal history of nicotine dependence: Secondary | ICD-10-CM | POA: Diagnosis present

## 2015-02-11 DIAGNOSIS — I255 Ischemic cardiomyopathy: Secondary | ICD-10-CM | POA: Diagnosis not present

## 2015-02-11 DIAGNOSIS — I25118 Atherosclerotic heart disease of native coronary artery with other forms of angina pectoris: Secondary | ICD-10-CM

## 2015-02-11 DIAGNOSIS — K219 Gastro-esophageal reflux disease without esophagitis: Secondary | ICD-10-CM | POA: Diagnosis present

## 2015-02-11 DIAGNOSIS — G4733 Obstructive sleep apnea (adult) (pediatric): Secondary | ICD-10-CM | POA: Diagnosis not present

## 2015-02-11 DIAGNOSIS — Z955 Presence of coronary angioplasty implant and graft: Secondary | ICD-10-CM | POA: Diagnosis not present

## 2015-02-11 DIAGNOSIS — I200 Unstable angina: Secondary | ICD-10-CM | POA: Diagnosis present

## 2015-02-11 DIAGNOSIS — R079 Chest pain, unspecified: Secondary | ICD-10-CM

## 2015-02-11 DIAGNOSIS — E785 Hyperlipidemia, unspecified: Secondary | ICD-10-CM | POA: Diagnosis not present

## 2015-02-11 DIAGNOSIS — E1142 Type 2 diabetes mellitus with diabetic polyneuropathy: Secondary | ICD-10-CM | POA: Diagnosis not present

## 2015-02-11 DIAGNOSIS — I2510 Atherosclerotic heart disease of native coronary artery without angina pectoris: Secondary | ICD-10-CM | POA: Diagnosis present

## 2015-02-11 DIAGNOSIS — F17200 Nicotine dependence, unspecified, uncomplicated: Secondary | ICD-10-CM | POA: Diagnosis present

## 2015-02-11 DIAGNOSIS — Z9861 Coronary angioplasty status: Secondary | ICD-10-CM

## 2015-02-11 DIAGNOSIS — Z720 Tobacco use: Secondary | ICD-10-CM | POA: Diagnosis present

## 2015-02-11 DIAGNOSIS — G629 Polyneuropathy, unspecified: Secondary | ICD-10-CM

## 2015-02-11 DIAGNOSIS — I5042 Chronic combined systolic (congestive) and diastolic (congestive) heart failure: Secondary | ICD-10-CM | POA: Diagnosis present

## 2015-02-11 LAB — CBC
HCT: 45.5 % (ref 39.0–52.0)
Hemoglobin: 15.1 g/dL (ref 13.0–17.0)
MCH: 28.8 pg (ref 26.0–34.0)
MCHC: 33.2 g/dL (ref 30.0–36.0)
MCV: 86.8 fL (ref 78.0–100.0)
PLATELETS: 206 10*3/uL (ref 150–400)
RBC: 5.24 MIL/uL (ref 4.22–5.81)
RDW: 15.9 % — ABNORMAL HIGH (ref 11.5–15.5)
WBC: 8.4 10*3/uL (ref 4.0–10.5)

## 2015-02-11 LAB — URINALYSIS, ROUTINE W REFLEX MICROSCOPIC
BILIRUBIN URINE: NEGATIVE
Glucose, UA: NEGATIVE mg/dL
KETONES UR: NEGATIVE mg/dL
Leukocytes, UA: NEGATIVE
Nitrite: NEGATIVE
PH: 5.5 (ref 5.0–8.0)
Protein, ur: 100 mg/dL — AB
SPECIFIC GRAVITY, URINE: 1.012 (ref 1.005–1.030)
UROBILINOGEN UA: 1 mg/dL (ref 0.0–1.0)

## 2015-02-11 LAB — I-STAT TROPONIN, ED: TROPONIN I, POC: 0.04 ng/mL (ref 0.00–0.08)

## 2015-02-11 LAB — BASIC METABOLIC PANEL
ANION GAP: 9 (ref 5–15)
BUN: 14 mg/dL (ref 6–20)
CALCIUM: 9 mg/dL (ref 8.9–10.3)
CO2: 23 mmol/L (ref 22–32)
CREATININE: 1.05 mg/dL (ref 0.61–1.24)
Chloride: 107 mmol/L (ref 101–111)
Glucose, Bld: 136 mg/dL — ABNORMAL HIGH (ref 65–99)
Potassium: 3.5 mmol/L (ref 3.5–5.1)
SODIUM: 139 mmol/L (ref 135–145)

## 2015-02-11 LAB — TROPONIN I
TROPONIN I: 0.03 ng/mL (ref <0.031)
TROPONIN I: 0.06 ng/mL — AB (ref <0.031)

## 2015-02-11 LAB — GLUCOSE, CAPILLARY
GLUCOSE-CAPILLARY: 126 mg/dL — AB (ref 65–99)
GLUCOSE-CAPILLARY: 110 mg/dL — AB (ref 65–99)
GLUCOSE-CAPILLARY: 134 mg/dL — AB (ref 65–99)

## 2015-02-11 LAB — HEPARIN LEVEL (UNFRACTIONATED): Heparin Unfractionated: 0.32 IU/mL (ref 0.30–0.70)

## 2015-02-11 LAB — MRSA PCR SCREENING: MRSA BY PCR: NEGATIVE

## 2015-02-11 LAB — D-DIMER, QUANTITATIVE: D-Dimer, Quant: 0.43 ug/mL-FEU (ref 0.00–0.48)

## 2015-02-11 LAB — URINE MICROSCOPIC-ADD ON

## 2015-02-11 LAB — MAGNESIUM: Magnesium: 2.1 mg/dL (ref 1.7–2.4)

## 2015-02-11 MED ORDER — FUROSEMIDE 40 MG PO TABS
60.0000 mg | ORAL_TABLET | Freq: Two times a day (BID) | ORAL | Status: DC
  Administered 2015-02-11 – 2015-02-12 (×3): 60 mg via ORAL
  Filled 2015-02-11 (×4): qty 1
  Filled 2015-02-11: qty 3

## 2015-02-11 MED ORDER — IPRATROPIUM BROMIDE 0.02 % IN SOLN
0.5000 mg | Freq: Once | RESPIRATORY_TRACT | Status: AC
Start: 2015-02-11 — End: 2015-02-11
  Administered 2015-02-11: 0.5 mg via RESPIRATORY_TRACT
  Filled 2015-02-11: qty 2.5

## 2015-02-11 MED ORDER — PRAVASTATIN SODIUM 40 MG PO TABS
80.0000 mg | ORAL_TABLET | Freq: Every evening | ORAL | Status: DC
  Administered 2015-02-11: 80 mg via ORAL
  Filled 2015-02-11: qty 1
  Filled 2015-02-11: qty 2

## 2015-02-11 MED ORDER — FAMOTIDINE 20 MG PO TABS
40.0000 mg | ORAL_TABLET | Freq: Two times a day (BID) | ORAL | Status: DC
  Administered 2015-02-11 (×2): 40 mg via ORAL
  Filled 2015-02-11 (×2): qty 2

## 2015-02-11 MED ORDER — NITROGLYCERIN 2 % TD OINT
1.0000 [in_us] | TOPICAL_OINTMENT | Freq: Once | TRANSDERMAL | Status: AC
  Administered 2015-02-11: 1 [in_us] via TOPICAL
  Filled 2015-02-11: qty 1

## 2015-02-11 MED ORDER — HYDRALAZINE HCL 25 MG PO TABS: 25.0000 mg | ORAL_TABLET | Freq: Three times a day (TID) | ORAL | Status: DC

## 2015-02-11 MED ORDER — CLOPIDOGREL BISULFATE 75 MG PO TABS
75.0000 mg | ORAL_TABLET | Freq: Every morning | ORAL | Status: DC
  Administered 2015-02-11 – 2015-02-12 (×2): 75 mg via ORAL
  Filled 2015-02-11 (×2): qty 1

## 2015-02-11 MED ORDER — AMLODIPINE BESYLATE 10 MG PO TABS
10.0000 mg | ORAL_TABLET | Freq: Every day | ORAL | Status: DC
  Administered 2015-02-11 – 2015-02-12 (×2): 10 mg via ORAL
  Filled 2015-02-11: qty 1
  Filled 2015-02-11: qty 2

## 2015-02-11 MED ORDER — CLONIDINE HCL 0.1 MG PO TABS
0.2000 mg | ORAL_TABLET | Freq: Two times a day (BID) | ORAL | Status: DC
  Administered 2015-02-11: 0.2 mg via ORAL
  Filled 2015-02-11: qty 1

## 2015-02-11 MED ORDER — AMLODIPINE BESYLATE 5 MG PO TABS: 5.0000 mg | ORAL_TABLET | Freq: Every day | ORAL | Status: DC

## 2015-02-11 MED ORDER — HYDRALAZINE HCL 20 MG/ML IJ SOLN
10.0000 mg | Freq: Four times a day (QID) | INTRAMUSCULAR | Status: DC | PRN
  Administered 2015-02-11: 10 mg via INTRAVENOUS
  Filled 2015-02-11: qty 1

## 2015-02-11 MED ORDER — INSULIN ASPART 100 UNIT/ML ~~LOC~~ SOLN: 0.0000 [IU] | Freq: Every day | SUBCUTANEOUS | Status: DC

## 2015-02-11 MED ORDER — MORPHINE SULFATE (PF) 2 MG/ML IV SOLN
2.0000 mg | Freq: Once | INTRAVENOUS | Status: AC
  Administered 2015-02-11: 2 mg via INTRAVENOUS
  Filled 2015-02-11: qty 1

## 2015-02-11 MED ORDER — ACETAMINOPHEN 325 MG PO TABS: 650.0000 mg | ORAL_TABLET | ORAL | Status: DC | PRN

## 2015-02-11 MED ORDER — HEPARIN (PORCINE) IN NACL 100-0.45 UNIT/ML-% IJ SOLN
1750.0000 [IU]/h | INTRAMUSCULAR | Status: DC
  Administered 2015-02-11 (×2): 1750 [IU]/h via INTRAVENOUS
  Filled 2015-02-11 (×2): qty 250

## 2015-02-11 MED ORDER — POTASSIUM CHLORIDE CRYS ER 20 MEQ PO TBCR
40.0000 meq | EXTENDED_RELEASE_TABLET | Freq: Two times a day (BID) | ORAL | Status: DC
  Administered 2015-02-11 – 2015-02-12 (×3): 40 meq via ORAL
  Filled 2015-02-11 (×3): qty 2

## 2015-02-11 MED ORDER — MULTI-VITAMIN/MINERALS PO TABS: 1.0000 | ORAL_TABLET | Freq: Every day | ORAL | Status: DC

## 2015-02-11 MED ORDER — GI COCKTAIL ~~LOC~~
30.0000 mL | Freq: Four times a day (QID) | ORAL | Status: DC | PRN
Start: 2015-02-11 — End: 2015-02-12

## 2015-02-11 MED ORDER — CLONIDINE HCL 0.1 MG PO TABS
0.3000 mg | ORAL_TABLET | Freq: Two times a day (BID) | ORAL | Status: DC
  Administered 2015-02-11 – 2015-02-12 (×2): 0.3 mg via ORAL
  Filled 2015-02-11 (×2): qty 3

## 2015-02-11 MED ORDER — CARVEDILOL 3.125 MG PO TABS
3.1250 mg | ORAL_TABLET | Freq: Two times a day (BID) | ORAL | Status: DC
  Administered 2015-02-11 – 2015-02-12 (×2): 3.125 mg via ORAL
  Filled 2015-02-11 (×4): qty 1

## 2015-02-11 MED ORDER — MORPHINE SULFATE (PF) 2 MG/ML IV SOLN: 2.0000 mg | Freq: Once | INTRAVENOUS | Status: DC

## 2015-02-11 MED ORDER — INFLUENZA VAC SPLIT QUAD 0.5 ML IM SUSY
0.5000 mL | PREFILLED_SYRINGE | INTRAMUSCULAR | Status: AC
  Administered 2015-02-12: 0.5 mL via INTRAMUSCULAR
  Filled 2015-02-11: qty 0.5

## 2015-02-11 MED ORDER — ONDANSETRON HCL 4 MG/2ML IJ SOLN: 4.0000 mg | Freq: Four times a day (QID) | INTRAMUSCULAR | Status: DC | PRN

## 2015-02-11 MED ORDER — ASPIRIN EC 81 MG PO TBEC
81.0000 mg | DELAYED_RELEASE_TABLET | Freq: Every day | ORAL | Status: DC
  Administered 2015-02-11: 81 mg via ORAL
  Filled 2015-02-11 (×3): qty 1

## 2015-02-11 MED ORDER — INSULIN ASPART 100 UNIT/ML ~~LOC~~ SOLN
0.0000 [IU] | Freq: Three times a day (TID) | SUBCUTANEOUS | Status: DC
  Administered 2015-02-11: 3 [IU] via SUBCUTANEOUS

## 2015-02-11 MED ORDER — MORPHINE SULFATE (PF) 2 MG/ML IV SOLN
2.0000 mg | INTRAVENOUS | Status: DC | PRN
  Administered 2015-02-11 (×2): 2 mg via INTRAVENOUS
  Filled 2015-02-11 (×2): qty 1

## 2015-02-11 MED ORDER — HEPARIN BOLUS VIA INFUSION
4000.0000 [IU] | Freq: Once | INTRAVENOUS | Status: AC
  Administered 2015-02-11: 4000 [IU] via INTRAVENOUS
  Filled 2015-02-11: qty 4000

## 2015-02-11 MED ORDER — ALBUTEROL SULFATE (2.5 MG/3ML) 0.083% IN NEBU
2.5000 mg | INHALATION_SOLUTION | Freq: Once | RESPIRATORY_TRACT | Status: AC
  Administered 2015-02-11: 2.5 mg via RESPIRATORY_TRACT
  Filled 2015-02-11: qty 3

## 2015-02-11 MED ORDER — MORPHINE SULFATE (PF) 4 MG/ML IV SOLN
4.0000 mg | Freq: Once | INTRAVENOUS | Status: AC
  Administered 2015-02-11: 4 mg via INTRAVENOUS
  Filled 2015-02-11: qty 1

## 2015-02-11 MED ORDER — ASPIRIN 81 MG PO TABS: 81.0000 mg | ORAL_TABLET | Freq: Every day | ORAL | Status: DC

## 2015-02-11 MED ORDER — PNEUMOCOCCAL VAC POLYVALENT 25 MCG/0.5ML IJ INJ
0.5000 mL | INJECTION | INTRAMUSCULAR | Status: DC
  Filled 2015-02-11: qty 0.5

## 2015-02-11 MED ORDER — ADULT MULTIVITAMIN W/MINERALS CH
1.0000 | ORAL_TABLET | Freq: Every day | ORAL | Status: DC
  Administered 2015-02-11 – 2015-02-12 (×2): 1 via ORAL
  Filled 2015-02-11 (×2): qty 1

## 2015-02-11 MED ORDER — ESCITALOPRAM OXALATE 10 MG PO TABS
10.0000 mg | ORAL_TABLET | Freq: Every day | ORAL | Status: DC
  Administered 2015-02-11 – 2015-02-12 (×2): 10 mg via ORAL
  Filled 2015-02-11 (×2): qty 1

## 2015-02-11 MED ORDER — SODIUM CHLORIDE 0.9 % IV BOLUS (SEPSIS)
500.0000 mL | Freq: Once | INTRAVENOUS | Status: AC
  Administered 2015-02-11: 500 mL via INTRAVENOUS

## 2015-02-11 MED ORDER — LOSARTAN POTASSIUM 50 MG PO TABS
100.0000 mg | ORAL_TABLET | Freq: Every day | ORAL | Status: DC
  Administered 2015-02-11 – 2015-02-12 (×2): 100 mg via ORAL
  Filled 2015-02-11 (×3): qty 2

## 2015-02-11 MED ORDER — NITROGLYCERIN 2 % TD OINT
1.0000 [in_us] | TOPICAL_OINTMENT | Freq: Four times a day (QID) | TRANSDERMAL | Status: DC
  Administered 2015-02-11 – 2015-02-12 (×3): 1 [in_us] via TOPICAL
  Filled 2015-02-11: qty 30

## 2015-02-11 NOTE — ED Notes (Signed)
Attempted report 

## 2015-02-11 NOTE — Progress Notes (Signed)
ANTICOAGULATION CONSULT NOTE - Follow Up Consult  Pharmacy Consult for heparin  Indication: chest pain/ACS  Allergies  Allergen Reactions  . Penicillins Other (See Comments)    Unknown childhood reaction    Patient Measurements: Height: 6\' 1"  (185.4 cm) Weight: (!) 330 lb 11 oz (150 kg) IBW/kg (Calculated) : 79.9 Heparin Dosing Weight: 112  Vital Signs: Temp: 99.8 F (37.7 C) (09/02 1637) Temp Source: Oral (09/02 1637) BP: 192/109 mmHg (09/02 1637) Pulse Rate: 74 (09/02 1637)  Labs:  Recent Labs  02/11/15 0538 02/11/15 1051 02/11/15 1726  HGB 15.1  --   --   HCT 45.5  --   --   PLT 206  --   --   HEPARINUNFRC  --   --  0.32  CREATININE 1.05  --   --   TROPONINI  --  0.03  --     Estimated Creatinine Clearance: 118.5 mL/min (by C-G formula based on Cr of 1.05).   Medications:  Scheduled:  . amLODipine  10 mg Oral Daily  . aspirin EC  81 mg Oral Daily  . carvedilol  3.125 mg Oral BID WC  . cloNIDine  0.3 mg Oral BID  . clopidogrel  75 mg Oral q morning - 10a  . escitalopram  10 mg Oral Daily  . famotidine  40 mg Oral Q12H  . furosemide  60 mg Oral BID  . [START ON 02/12/2015] Influenza vac split quadrivalent PF  0.5 mL Intramuscular Tomorrow-1000  . insulin aspart  0-20 Units Subcutaneous TID WC  . insulin aspart  0-5 Units Subcutaneous QHS  . losartan  100 mg Oral Daily  . multivitamin with minerals  1 tablet Oral Daily  . nitroGLYCERIN  1 inch Topical 4 times per day  . [START ON 02/12/2015] pneumococcal 23 valent vaccine  0.5 mL Intramuscular Tomorrow-1000  . potassium chloride SA  40 mEq Oral BID  . pravastatin  80 mg Oral QPM   Infusions:  . heparin 1,750 Units/hr (02/11/15 1049)    Assessment: 59 yo male with CP on heparin for possible ACS/PE. D-dimer= 0.43, troponin max= 0.04 and initial heparin level was at goal (HL= 0.32) on 1750 units/hr  Goal of Therapy:  Heparin level 0.3-0.7 units/ml Monitor platelets by anticoagulation protocol: Yes    Plan:   -No heparin changes needed -Will recheck a heparin level and CBC in am  Hildred Laser, Pharm D 02/11/2015 6:57 PM

## 2015-02-11 NOTE — ED Notes (Signed)
Pt made aware of bed assignment 

## 2015-02-11 NOTE — Progress Notes (Signed)
ANTICOAGULATION CONSULT NOTE - Initial Consult  Pharmacy Consult for heparin Indication: chest pain/ACS  Allergies  Allergen Reactions  . Penicillins Other (See Comments)    Unknown childhood reaction    Patient Measurements: Height: 6\' 1"  (185.4 cm) Weight: (!) 316 lb (143.337 kg) IBW/kg (Calculated) : 79.9 Heparin Dosing Weight: 112kg  Vital Signs: Temp: 97.9 F (36.6 C) (09/02 0507) Temp Source: Oral (09/02 0507) BP: 174/93 mmHg (09/02 1000) Pulse Rate: 78 (09/02 1000)  Labs:  Recent Labs  02/11/15 0538  HGB 15.1  HCT 45.5  PLT 206  CREATININE 1.05    Estimated Creatinine Clearance: 115.6 mL/min (by C-G formula based on Cr of 1.05).   Medical History: Past Medical History  Diagnosis Date  . Dyslipidemia   . HTN (hypertension)   . CAD (coronary artery disease)     a. s/p PCI/BMS pRCA 2002; b. PCI pRCA & DES p/m RCA 2006; c. PCI/DES OM4 2007; d. PCI/CBA to RCA for ISR 10/2006; e.08/2012 STEMI/Cath/PCI: LM nl, LAD95-47m (3.0x20 Promus Prem DES), 95/95 ap LAD (1.0-1.52mm), LCX 30p 95d (sm), LPL1 70-80p, patent stent, LPL2 nl, RCA 30p, 40/70 ISR, EF 35-40%.  . Ischemic cardiomyopathy     a. EF 40%; improved to normal;  b. 08/2012 Echo: EF 50-55%, mod LVH.  [name] GERD (gastroesophageal reflux disease)   . Tobacco abuse   . Obesity   . OSA (obstructive sleep apnea)     Does not use CPAP as of 05/2011  . Depression   . History of pneumonia   . Diabetes mellitus     a. A1c 8.8 08/2012->Metformin initiated. => b. A1c (9/14): 6.6  . Hyperlipidemia   . Myocardial infarction     Medications:  Infusions:  . heparin      Assessment: 58 yom presented to the hospital with CP. To start heparin IV for anticoagulation. CBC is WNL. He was not on anticoagulation PTA.   Goal of Therapy:  Heparin level 0.3-0.7 units/ml Monitor platelets by anticoagulation protocol: Yes   Plan:  - Heparin bolus 4000 units IV x 1 - Heparin gtt 1750 units/hr - Check a 6 hour heparin level -  Daily heparin level and CBC  Leather Estis, Rande Lawman 02/11/2015,10:46 AM

## 2015-02-11 NOTE — ED Notes (Signed)
Pt arrives from home via [facility] c/o CP starting yesterday afternoon, 3pm.  Pt reports pain began as sharp " pinpoint", spread and became pressure.  Pt endorses SOB, neck pain, denies N/V/D, dizziness.  Pt reports worse with cough and deep respirations, no change with external pressure.

## 2015-02-11 NOTE — ED Provider Notes (Signed)
CSN: [id number]     Arrival date & time 02/11/15  0459 History   First MD Initiated Contact with Patient 02/11/15 [phone]     Chief Complaint  Patient presents with  . Chest Pain     (Consider location/radiation/quality/duration/timing/severity/associated sxs/prior Treatment) HPI  Pt is a 59 y.o. Male, current smoker, 15 pack year hx,  with phx of HTN, CAD, MI with multiple stents, presents to the ED with central chest pain and pressure, which began at rest yesterday at 3pm in the afternoon.   It first began as sharp twinge in left chest with some radiation to shoulder/neck, resolved spontaneously after 5-10 minutes but he had remaining chest pressure that persisted until it woke him up at 3 am with increasing pressure, pain and associated SOB and diaphoresis.  EMS was called and he was given 324 ASA and 2 nitro in route with minor relief of CP.  He is currently rated 7/10, located in his left substernal chest without radiation, and described as a constant pressure.  He has had a productive cough x2d, does not know color of sputum or presence of blood, states he did not look at it.  At baseline he has 3-pillow orthopnea, worse he states was worse last night.  He denies palpitation, LE edema, sweats, chills, fever, wheeze.   Past Medical History  Diagnosis Date  . Dyslipidemia   . HTN (hypertension)   . CAD (coronary artery disease)     a. s/p PCI/BMS pRCA 2002; b. PCI pRCA & DES p/m RCA 2006; c. PCI/DES OM4 2007; d. PCI/CBA to RCA for ISR 10/2006; e.08/2012 STEMI/Cath/PCI: LM nl, LAD95-53m (3.0x20 Promus Prem DES), 95/95 ap LAD (1.0-1.70mm), LCX 30p 95d (sm), LPL1 70-80p, patent stent, LPL2 nl, RCA 30p, 40/70 ISR, EF 35-40%.  . Ischemic cardiomyopathy     a. EF 40%; improved to normal;  b. 08/2012 Echo: EF 50-55%, mod LVH.  [name] GERD (gastroesophageal reflux disease)   . Tobacco abuse   . Obesity   . OSA (obstructive sleep apnea)     Does not use CPAP as of 05/2011  . Depression   . History of  pneumonia   . Diabetes mellitus     a. A1c 8.8 08/2012->Metformin initiated. => b. A1c (9/14): 6.6  . Hyperlipidemia   . Myocardial infarction    Past Surgical History  Procedure Laterality Date  . Coronary stent placement  2009  . Coronary angioplasty with stent placement    . Left heart catheterization with coronary angiogram N/A 08/31/2012    Procedure: LEFT HEART CATHETERIZATION WITH CORONARY ANGIOGRAM;  Surgeon: Burnell Blanks, MD;  Location: [facility] CATH LAB;  Service: Cardiovascular;  Laterality: N/A;  . Percutaneous coronary stent intervention (pci-s) Right 08/31/2012    Procedure: PERCUTANEOUS CORONARY STENT INTERVENTION (PCI-S);  Surgeon: Burnell Blanks, MD;  Location: [facility] CATH LAB;  Service: Cardiovascular;  Laterality: Right;  . Left heart catheterization with coronary angiogram N/A 11/16/2013    Procedure: LEFT HEART CATHETERIZATION WITH CORONARY ANGIOGRAM;  Surgeon: Blane Ohara, MD;  Location: [facility] CATH LAB;  Service: Cardiovascular;  Laterality: N/A;  . Percutaneous coronary stent intervention (pci-s)  11/16/2013    Procedure: PERCUTANEOUS CORONARY STENT INTERVENTION (PCI-S);  Surgeon: Blane Ohara, MD;  Location: [facility] CATH LAB;  Service: Cardiovascular;;  . Left heart catheterization with coronary angiogram N/A 03/15/2014    Procedure: LEFT HEART CATHETERIZATION WITH CORONARY ANGIOGRAM;  Surgeon: Peter M Martinique, MD;  Location: [facility] CATH LAB;  Service: Cardiovascular;  Laterality:  N/A;   Family History  Problem Relation Age of Onset  . Coronary artery disease    . Depression Mother   . Cancer Mother     ovarian  . Hypertension Mother     Died, 31  . Other Father     motor vehicle accident  . Heart attack Neg Hx   . Stroke Neg Hx    Social History  Substance Use Topics  . Smoking status: Current Every Day Smoker -- 0.50 packs/day for 30 years    Types: Cigarettes  . Smokeless tobacco: None     Comment: trying to cut down  . Alcohol Use: No    Review of  Systems  Constitutional: Negative for activity change, appetite change and unexpected weight change.  HENT: Negative.  Negative for congestion, rhinorrhea and sinus pressure.   Eyes: Negative.   Respiratory: Negative for choking, wheezing and stridor.   Cardiovascular: Negative for palpitations and leg swelling.  Gastrointestinal: Negative.  Negative for nausea, vomiting, abdominal pain, diarrhea and constipation.  Endocrine: Negative.   Genitourinary: Negative.   Musculoskeletal: Negative.   Skin: Negative.   Allergic/Immunologic: Negative.   Neurological: Negative for syncope, weakness and light-headedness.  Psychiatric/Behavioral: Negative.    Allergies  Penicillins  Home Medications   Prior to Admission medications   Medication Sig Start Date End Date Taking? Authorizing Provider  amLODipine (NORVASC) 10 MG tablet Take 10 mg by mouth daily.   Yes Historical Provider, MD  aspirin 81 MG tablet Take 81 mg by mouth daily.   Yes Historical Provider, MD  Blood Glucose Monitoring Suppl (ACCU-CHEK AVIVA PLUS) W/DEVICE KIT Use as directed to check blood sugar.  Diagnosis code E11.9 04/13/14  Yes Biagio Borg, MD  carvedilol (COREG) 3.125 MG tablet Take 1 tablet (3.125 mg total) by mouth 2 (two) times daily with a meal. 10/08/14  Yes Sherren Mocha, MD  cloNIDine (CATAPRES) 0.2 MG tablet TAKE 1 TABLET BY MOUTH TWICE DAILY 10/08/14  Yes Sherren Mocha, MD  clopidogrel (PLAVIX) 75 MG tablet Take 1 tablet (75 mg total) by mouth every morning. 11/17/13  Yes Rhonda G Barrett, PA-C  escitalopram (LEXAPRO) 10 MG tablet TAKE 1 TABLET BY MOUTH ONCE DAILY 10/01/14  Yes Biagio Borg, MD  famotidine (PEPCID) 40 MG tablet Take 40 mg by mouth every 12 (twelve) hours.    Yes Historical Provider, MD  glucose blood (ACCU-CHEK AVIVA) test strip Use as directed three times daily to check blood sugar.  Diagnosis code E11.9 04/13/14  Yes Biagio Borg, MD  isosorbide mononitrate (IMDUR) 30 MG 24 hr tablet Take 3 tablets  (90 mg total) by mouth daily. 08/09/14  Yes Liliane Shi, PA-C  Lancets (ACCU-CHEK MULTICLIX) lancets Use as directed three times daily to check blood sugar.  Diagnosis code E11.9 04/13/14  Yes Biagio Borg, MD  losartan (COZAAR) 100 MG tablet Take 1 tablet (100 mg total) by mouth daily. 12/10/14  Yes Sherren Mocha, MD  metFORMIN (GLUCOPHAGE-XR) 500 MG 24 hr tablet Take 1,000 mg by mouth daily with breakfast.    Yes Historical Provider, MD  Multiple Vitamins-Minerals (MULTIVITAMIN WITH MINERALS) tablet Take 1 tablet by mouth daily. CENTRUM SILVER MENS  50 PLUS   Yes Historical Provider, MD  nitroGLYCERIN (NITROSTAT) 0.4 MG SL tablet Place 1 tablet (0.4 mg total) under the tongue every 5 (five) minutes as needed for chest pain. For chest pain 07/29/14  Yes Liliane Shi, PA-C  potassium chloride SA (K-DUR,KLOR-CON) 20 MEQ tablet  Take 2 tablets (40 mEq total) by mouth 2 (two) times daily. 09/15/14  Yes Sueanne Margarita, MD  pravastatin (PRAVACHOL) 80 MG tablet TAKE 1 TABLET BY MOUTH EVERY EVENING 06/22/14  Yes Biagio Borg, MD  furosemide (LASIX) 40 MG tablet TAKE 2 TABLETS TWICE DAILY. 02/12/15   Cherene Altes, MD   BP 140/90 mmHg  Pulse 57  Temp(Src) 98 F (36.7 C) (Oral)  Resp 16  Ht $R'6\' 1"'QD$  (1.854 m)  Wt 330 lb 11 oz (150 kg)  BMI 43.64 kg/m2  SpO2 96% Physical Exam  Constitutional: He is oriented to person, place, and time. He appears well-developed and well-nourished. No distress.  Obese male, appears stated age, in NAD  HENT:  Head: Normocephalic and atraumatic.  Nose: Nose normal.  Mouth/Throat: Oropharynx is clear and moist. No oropharyngeal exudate.  Eyes: Conjunctivae and EOM are normal. Pupils are equal, round, and reactive to light. Right eye exhibits no discharge. Left eye exhibits no discharge. No scleral icterus.  Neck: Normal range of motion. No JVD present. Carotid bruit is not present. No tracheal deviation present. No thyromegaly present.  Cardiovascular: Normal rate, regular  rhythm, normal heart sounds and intact distal pulses.  Exam reveals no gallop and no friction rub.   No murmur heard. Pulses:      Radial pulses are 2+ on the right side, and 2+ on the left side.       Dorsalis pedis pulses are 2+ on the right side, and 2+ on the left side.  No pitting edema  Pulmonary/Chest: Effort normal and breath sounds normal. No accessory muscle usage. No respiratory distress. He has no decreased breath sounds. He has no wheezes. He has no rhonchi. He has no rales. He exhibits no tenderness and no retraction.  Abdominal: Soft. Normal appearance and bowel sounds are normal. He exhibits no distension and no mass. There is no tenderness. There is no rebound and no guarding.  Obese, soft, non-distended, non-tender  Musculoskeletal: Normal range of motion. He exhibits no edema or tenderness.  Lymphadenopathy:    He has no cervical adenopathy.  Neurological: He is alert and oriented to person, place, and time. He has normal reflexes. No cranial nerve deficit. He exhibits normal muscle tone. Coordination normal.  Skin: Skin is warm and dry. No rash noted. He is not diaphoretic. No erythema. No pallor.  Psychiatric: He has a normal mood and affect. His behavior is normal. Judgment and thought content normal.  Nursing note and vitals reviewed.   ED Course  Procedures (including critical care time) Labs Review Labs Reviewed  BASIC METABOLIC PANEL - Abnormal; Notable for the following:    Glucose, Bld 136 (*)    All other components within normal limits  CBC - Abnormal; Notable for the following:    RDW 15.9 (*)    All other components within normal limits  URINALYSIS, ROUTINE W REFLEX MICROSCOPIC (NOT AT [facility]) - Abnormal; Notable for the following:    Hgb urine dipstick TRACE (*)    Protein, ur 100 (*)    All other components within normal limits  TROPONIN I - Abnormal; Notable for the following:    Troponin I 0.06 (*)    All other components within normal limits   TROPONIN I - Abnormal; Notable for the following:    Troponin I 0.04 (*)    All other components within normal limits  HEMOGLOBIN A1C - Abnormal; Notable for the following:    Hgb A1c MFr Bld 7.0 (*)  All other components within normal limits  GLUCOSE, CAPILLARY - Abnormal; Notable for the following:    Glucose-Capillary 126 (*)    All other components within normal limits  GLUCOSE, CAPILLARY - Abnormal; Notable for the following:    Glucose-Capillary 110 (*)    All other components within normal limits  CBC - Abnormal; Notable for the following:    RDW 16.0 (*)    All other components within normal limits  GLUCOSE, CAPILLARY - Abnormal; Notable for the following:    Glucose-Capillary 134 (*)    All other components within normal limits  GLUCOSE, CAPILLARY - Abnormal; Notable for the following:    Glucose-Capillary 102 (*)    All other components within normal limits  GLUCOSE, CAPILLARY - Abnormal; Notable for the following:    Glucose-Capillary 106 (*)    All other components within normal limits  MRSA PCR SCREENING  MRSA PCR SCREENING  MAGNESIUM  URINE MICROSCOPIC-ADD ON  D-DIMER, QUANTITATIVE (NOT AT [facility])  TROPONIN I  HEPARIN LEVEL (UNFRACTIONATED)  HEPARIN LEVEL (UNFRACTIONATED)  I-STAT TROPOININ, ED    Imaging Review No results found. I have personally reviewed and evaluated these images and lab results as part of my medical decision-making.   EKG Interpretation   Date/Time:  Friday February 11 2015 09:23:49 EDT Ventricular Rate:  72 PR Interval:  166 QRS Duration: 163 QT Interval:  425 QTC Calculation: 465 R Axis:   40 Text Interpretation:  Sinus rhythm Multiform ventricular premature  complexes Probable left atrial enlargement Right bundle branch block ED  PHYSICIAN INTERPRETATION AVAILABLE IN [facility] Confirmed by TEST,  Record ([id number]) on 02/12/2015 11:49:42 AM      MDM   Final diagnoses:  None    CP with onset at rest, concerning for  cardiac etiology/unstable angina - pt with significant cardiac hx and multiple risk factors for cardiac event - heart score 4 Initial troponin negative, EKG sinus rhythm with PAC's, new RBBB, CXR negative for acute pathology - presence of chronic bibasilar airspace opacities Pt pain was initially 7/10, improved to 3/10 with 2 of morphine  At 0915 CP increased, repeat EKG ordered, 4 of morphine and nitropaste Pt was admitted to medicine with cardiology consulted - Dr. Coralyn Pear attending, Ebony Hail NP from cardiology Pt to go to step-down with persistent CP.  Pt has had increasing BP while in the ED, likely because pt has not had his morning meds administered yet.    Medications  sodium chloride 0.9 % bolus 500 mL (0 mLs Intravenous Stopped 02/11/15 0918)  morphine 2 MG/ML injection 2 mg (2 mg Intravenous Given 02/11/15 0701)  morphine 4 MG/ML injection 4 mg (4 mg Intravenous Given 02/11/15 0921)  nitroGLYCERIN (NITROGLYN) 2 % ointment 1 inch (1 inch Topical Given 02/11/15 0920)  heparin bolus via infusion 4,000 Units (4,000 Units Intravenous Given 02/11/15 1054)  ipratropium (ATROVENT) nebulizer solution 0.5 mg (0.5 mg Nebulization Given 02/11/15 1049)  albuterol (PROVENTIL) (2.5 MG/3ML) 0.083% nebulizer solution 2.5 mg (2.5 mg Nebulization Given 02/11/15 1049)  Influenza vac split quadrivalent PF (FLUARIX) injection 0.5 mL (0.5 mLs Intramuscular Given 02/12/15 1003)       Delsa Grana, PA-C 02/14/15 [place], MD 02/16/15 [phone]

## 2015-02-11 NOTE — Progress Notes (Signed)
BP poorly controlled. Will increase Clonidine to 0.3 mg BID; nitropaste inadvertandtly not ordered at admit (using in setting of CP instead of usual Imdur)- have also added prn IV Hydralazine.  Erin Hearing, ANP

## 2015-02-11 NOTE — Progress Notes (Signed)
  Echocardiogram 2D Echocardiogram has been performed.  [name] [name] M 02/11/2015, 2:31 PM

## 2015-02-11 NOTE — H&P (Signed)
[facility] History and Physical                                                                                    Mathias Bogacki, is a 59 y.o. male  MRN: [id number]   DOB - 08/17/1955  Admit Date - 02/11/2015  Outpatient Primary MD for the patient is Cathlean Cower, MD  Referring MD: Canary Brim / ER  Consulting MD: [facility]  With History of -  Past Medical History  Diagnosis Date  . Dyslipidemia   . HTN (hypertension)   . CAD (coronary artery disease)     a. s/p PCI/BMS pRCA 2002; b. PCI pRCA & DES p/m RCA 2006; c. PCI/DES OM4 2007; d. PCI/CBA to RCA for ISR 10/2006; e.08/2012 STEMI/Cath/PCI: LM nl, LAD95-66m (3.0x20 Promus Prem DES), 95/95 ap LAD (1.0-1.64mm), LCX 30p 95d (sm), LPL1 70-80p, patent stent, LPL2 nl, RCA 30p, 40/70 ISR, EF 35-40%.  . Ischemic cardiomyopathy     a. EF 40%; improved to normal;  b. 08/2012 Echo: EF 50-55%, mod LVH.  [name] GERD (gastroesophageal reflux disease)   . Tobacco abuse   . Obesity   . OSA (obstructive sleep apnea)     Does not use CPAP as of 05/2011  . Depression   . History of pneumonia   . Diabetes mellitus     a. A1c 8.8 08/2012->Metformin initiated. => b. A1c (9/14): 6.6  . Hyperlipidemia   . Myocardial infarction       Past Surgical History  Procedure Laterality Date  . Coronary stent placement  2009  . Coronary angioplasty with stent placement    . Left heart catheterization with coronary angiogram N/A 08/31/2012    Procedure: LEFT HEART CATHETERIZATION WITH CORONARY ANGIOGRAM;  Surgeon: Burnell Blanks, MD;  Location: [facility] CATH LAB;  Service: Cardiovascular;  Laterality: N/A;  . Percutaneous coronary stent intervention (pci-s) Right 08/31/2012    Procedure: PERCUTANEOUS CORONARY STENT INTERVENTION (PCI-S);  Surgeon: Burnell Blanks, MD;  Location: [facility] CATH LAB;  Service: Cardiovascular;  Laterality: Right;  . Left heart catheterization with coronary angiogram N/A 11/16/2013    Procedure: LEFT HEART CATHETERIZATION WITH  CORONARY ANGIOGRAM;  Surgeon: Blane Ohara, MD;  Location: [facility] CATH LAB;  Service: Cardiovascular;  Laterality: N/A;  . Percutaneous coronary stent intervention (pci-s)  11/16/2013    Procedure: PERCUTANEOUS CORONARY STENT INTERVENTION (PCI-S);  Surgeon: Blane Ohara, MD;  Location: [facility] CATH LAB;  Service: Cardiovascular;;  . Left heart catheterization with coronary angiogram N/A 03/15/2014    Procedure: LEFT HEART CATHETERIZATION WITH CORONARY ANGIOGRAM;  Surgeon: Peter M Martinique, MD;  Location: [facility] CATH LAB;  Service: Cardiovascular;  Laterality: N/A;    in for   Chief Complaint  Patient presents with  . Chest Pain     HPI This is a 59 year old male patient with history of morbid obesity, severe sleep apnea on nocturnal CPAP, diabetes, peripheral neuropathy, hypertension, dyslipidemia, ongoing tobacco abuse, known ischemic cardiomyopathy with EF improved to 50-55% by last echocardiogram 2015. He has has a known history of CAD with prior stents or PCI in 2002, 2006, 2007, 2008, 2014 and 2015. Last catheterization was in October 2015  secondary to NSTEMI which revealed severe disease in the small distal LCx and OM2 and moderate disease in the mid RCA and distal LAD which had not changed significantly since previous catheterization June 2015. At that point medical therapy was continued and his beta blocker was reduced because of bradycardia. In June of this year he had a repeat sleep study which confirmed severe sleep apnea and he's been started on CPAP since that time. He returns today with reports of waxing and waning chest pain that began yesterday afternoon at rest while he was driving his vehicle. No associated symptoms. Pain increased when supine. Chest pain persisted through the night causing him to not sleep well. He awakened again at 3 AM with significant discomfort with most severe being 9/10. At this point he noticed he was somewhat short of breath and had a slight nonproductive cough. He  did notice the pain was slightly increase with taking very deep breaths. He took 4 aspirin. He eventually called EMS and was given nitroglycerin in route. None of these measures seemed to improve his discomfort. Again he reports the pain began yesterday afternoon and has never resolved and waxes and wanes in intensity.  In the ER patient was afebrile, initial blood pressure was 150/72 and subsequently climbed early this morning to 180/98 in the setting of patient not receiving usual antihypertensives of medications heart rate has been between 65 and 72, respirations between 19 and 24 with room air saturations 95%. Chest x-ray showed no acute disease. EKG revealed sinus rhythm with a few multifocal PVCs, right bundle branch block, no definitive ST or T-wave changes, and no changes from previous EKGs. Laboratory data unremarkable including normal troponin of 0.04. After arrival to the ER he has received several doses of IV morphine which has decreased his pain somewhat noting that pain intensified around 920 this morning and he was subsequently given a combination of nitroglycerin paste 1 inch and 4 mg of morphine IV with decrease in pain from 9/10 to 5/10. Of note patient has not had any associated symptoms of radiation of pain, nausea, diaphoresis or shortness of breath. Because of his significant CAD history and known persistent medical disease I asked EDP to notify cardiology of admission and request formal consultation.     Review of Systems   In addition to the HPI above,  No Fever-chills, myalgias or other constitutional symptoms No Headache, changes with Vision or hearing, new weakness, tingling, numbness in any extremity, No problems swallowing food or Liquids, indigestion/reflux No palpitations, orthopnea or DOE No Abdominal pain, N/V; no melena or hematochezia, no dark tarry stools, Bowel movements are regular, No dysuria, hematuria or flank pain No new skin rashes, lesions, masses or  bruises, No new joints pains-aches No recent weight gain or loss No polyuria, polydypsia or polyphagia,  *A full 10 point Review of Systems was done, except as stated above, all other Review of Systems were negative.  Social History Social History  Substance Use Topics  . Smoking status: Current Every Day Smoker -- < 1 pack/day for 30 years    Types: Cigarettes  . Smokeless tobacco: Not on file     Comment: trying to cut down  . Alcohol Use: No    Resides at: Private residence  Lives with: Alone  Ambulatory status: Occasionally with a cane secondary to peripheral neuropathy   Family History Family History  Problem Relation Age of Onset  . Coronary artery disease    . Depression Mother   .  Cancer Mother     ovarian  . Hypertension Mother     Died, 60  . Other Father     motor vehicle accident  . Heart attack Neg Hx   . Stroke Neg Hx      Prior to Admission medications   Medication Sig Start Date End Date Taking? Authorizing Provider  amLODipine (NORVASC) 10 MG tablet Take 10 mg by mouth daily.   Yes Historical Provider, MD  aspirin 81 MG tablet Take 81 mg by mouth daily.   Yes Historical Provider, MD  Blood Glucose Monitoring Suppl (ACCU-CHEK AVIVA PLUS) W/DEVICE KIT Use as directed to check blood sugar.  Diagnosis code E11.9 04/13/14  Yes Biagio Borg, MD  carvedilol (COREG) 3.125 MG tablet Take 1 tablet (3.125 mg total) by mouth 2 (two) times daily with a meal. 10/08/14  Yes Sherren Mocha, MD  cloNIDine (CATAPRES) 0.2 MG tablet TAKE 1 TABLET BY MOUTH TWICE DAILY 10/08/14  Yes Sherren Mocha, MD  clopidogrel (PLAVIX) 75 MG tablet Take 1 tablet (75 mg total) by mouth every morning. 11/17/13  Yes Rhonda G Barrett, PA-C  escitalopram (LEXAPRO) 10 MG tablet TAKE 1 TABLET BY MOUTH ONCE DAILY 10/01/14  Yes Biagio Borg, MD  famotidine (PEPCID) 40 MG tablet Take 40 mg by mouth every 12 (twelve) hours.    Yes Historical Provider, MD  furosemide (LASIX) 40 MG tablet TAKE 1& 1/2  TABLET BY MOUTH TWICE DAILY Patient taking differently: TAKE 2 TABLETS TWICE DAILY. 12/09/14  Yes Sherren Mocha, MD  glucose blood (ACCU-CHEK AVIVA) test strip Use as directed three times daily to check blood sugar.  Diagnosis code E11.9 04/13/14  Yes Biagio Borg, MD  isosorbide mononitrate (IMDUR) 30 MG 24 hr tablet Take 3 tablets (90 mg total) by mouth daily. 08/09/14  Yes Liliane Shi, PA-C  Lancets (ACCU-CHEK MULTICLIX) lancets Use as directed three times daily to check blood sugar.  Diagnosis code E11.9 04/13/14  Yes Biagio Borg, MD  losartan (COZAAR) 100 MG tablet Take 1 tablet (100 mg total) by mouth daily. 12/10/14  Yes Sherren Mocha, MD  metFORMIN (GLUCOPHAGE-XR) 500 MG 24 hr tablet Take 1,000 mg by mouth daily with breakfast.    Yes Historical Provider, MD  Multiple Vitamins-Minerals (MULTIVITAMIN WITH MINERALS) tablet Take 1 tablet by mouth daily. CENTRUM SILVER MENS  50 PLUS   Yes Historical Provider, MD  nitroGLYCERIN (NITROSTAT) 0.4 MG SL tablet Place 1 tablet (0.4 mg total) under the tongue every 5 (five) minutes as needed for chest pain. For chest pain 07/29/14  Yes Liliane Shi, PA-C  potassium chloride SA (K-DUR,KLOR-CON) 20 MEQ tablet Take 2 tablets (40 mEq total) by mouth 2 (two) times daily. 09/15/14  Yes Sueanne Margarita, MD  pravastatin (PRAVACHOL) 80 MG tablet TAKE 1 TABLET BY MOUTH EVERY EVENING 06/22/14  Yes Biagio Borg, MD    Allergies  Allergen Reactions  . Penicillins Other (See Comments)    Unknown childhood reaction    Physical Exam  Vitals  Blood pressure 180/98, pulse 72, temperature 97.9 F (36.6 C), temperature source Oral, resp. rate 24, height $RemoveBe'6\' 1"'OjVGyHPMu$  (1.854 m), weight 316 lb (143.337 kg), SpO2 93 %.   General:  In no acute distress, appears healthy and well nourished  Psych:  Normal affect, Denies Suicidal or Homicidal ideations, Awake Alert, Oriented X 3. Speech and thought patterns are clear and appropriate, no apparent short term memory  deficits  Neuro:   No focal neurological deficits,  CN II through XII intact, Strength 5/5 all 4 extremities, Sensation intact all 4 extremities.  ENT:  Ears and Eyes appear Normal, Conjunctivae clear, PER. Moist oral mucosa without erythema or exudates.  Neck:  Supple, No lymphadenopathy appreciated  Respiratory:  Symmetrical chest wall movement, Good air movement bilaterally, CTAB. Room Air  Cardiac:  RRR, No Murmurs, no LE edema noted although evidence of previous chronic edema based on brawny skin changes, no JVD, No carotid bruits, peripheral pulses palpable at 2+  Abdomen:  Positive bowel sounds, Soft, Non tender, Non distended,  No masses appreciated, no obvious hepatosplenomegaly  Skin:  No Cyanosis, Normal Skin Turgor, No Skin Rash or Bruise.  Musculoskeletal: Chest pain not reproducible with direct palpation over anterior chest wall or with active/passive range of motion of left upper extremity  Extremities: Symmetrical without obvious trauma or injury,  no effusions.  Data Review  CBC  Recent Labs Lab 02/11/15 0538  WBC 8.4  HGB 15.1  HCT 45.5  PLT 206  MCV 86.8  MCH 28.8  MCHC 33.2  RDW 15.9*    Chemistries   Recent Labs Lab 02/11/15 0538 02/11/15 0728  NA 139  --   K 3.5  --   CL 107  --   CO2 23  --   GLUCOSE 136*  --   BUN 14  --   CREATININE 1.05  --   CALCIUM 9.0  --   MG  --  2.1    estimated creatinine clearance is 115.6 mL/min (by C-G formula based on Cr of 1.05).  No results for input(s): TSH, T4TOTAL, T3FREE, THYROIDAB in the last 72 hours.  Invalid input(s): FREET3  Coagulation profile No results for input(s): INR, PROTIME in the last 168 hours.  No results for input(s): DDIMER in the last 72 hours.  Cardiac Enzymes No results for input(s): CKMB, TROPONINI, MYOGLOBIN in the last 168 hours.  Invalid input(s): CK  Invalid input(s): POCBNP  Urinalysis    Component Value Date/Time   COLORURINE YELLOW 02/11/2015 0701    APPEARANCEUR CLEAR 02/11/2015 0701   LABSPEC 1.012 02/11/2015 0701   PHURINE 5.5 02/11/2015 0701   GLUCOSEU NEGATIVE 02/11/2015 0701   GLUCOSEU NEGATIVE 01/26/2014 0829   HGBUR TRACE* 02/11/2015 0701   HGBUR negative 08/23/2009 0000   BILIRUBINUR NEGATIVE 02/11/2015 0701   KETONESUR NEGATIVE 02/11/2015 0701   PROTEINUR 100* 02/11/2015 0701   UROBILINOGEN 1.0 02/11/2015 0701   NITRITE NEGATIVE 02/11/2015 0701   LEUKOCYTESUR NEGATIVE 02/11/2015 0701    Imaging results:   Dg Chest 2 View  02/11/2015   CLINICAL DATA:  Chest pain beginning at 3 p.m. 02/10/2015.  EXAM: CHEST  2 VIEW  COMPARISON:  PA and lateral chest 03/12/2014, 01/20/2013 and 11/13/2013.  FINDINGS: Mild, streaky bibasilar airspace opacities are chronic. The lungs are otherwise clear. Heart size is upper normal. No pneumothorax or pleural effusion. No focal bony abnormality.  IMPRESSION: No acute disease.   Electronically Signed   By: Inge Rise M.D.   On: 02/11/2015 07:45     EKG: (Independently reviewed) sinus rhythm with a few multifocal PVCs, right bundle branch block, no definitive ST or T-wave changes, and no changes from previous EKGs.   Assessment & Plan  Principal Problem:   Unstable angina/Coronary Artery Disease -Admit to telemetry observation status -Awaiting formal cardiology consultation -NPO until evaluated by cardiology -Has had chest pain greater than 12 hours with normal troponin which is reassuring but given history of known persistent medical disease and  history of multiple stent placements/PCI in a patient with diabetes and continued tobacco abuse concern may require additional invasive evaluation this admission -Continue carvedilol, Plavix, and Pravachol -Continue nitroglycerin paste and hold home Imdur-if chest pain increases on paste will need to convert to NTG infusion -IV heparin with pharmacy managing -Cycle troponin -Repeat echocardiogram  Active Problems:   Uncontrolled  hypertension -Resume home medications and given initial doses while in ER (Norvasc, carvedilol, Catapres, Cozaar)      Ischemic cardiomyopathy- EF 50-55% June 4712/ Chronic diastolic congestive heart failure -Systolic dysfunction has improved with medical therapy for ischemia -Both systolic and diastolic components appear compensated at this juncture -Continue medical therapies as above including Lasix    Obesity, Class III, BMI 40-49.9 (morbid obesity)/  OSA  -Continue nocturnal CPAP -When necessary nasal cannula oxygen during the daytime if has desaturations    DM2 (diabetes mellitus, type 2) -Hold metformin acutely -Check CBGs and provide SS if needed    GASTROESOPHAGEAL REFLUX DISEASE -Currently not experiencing indigestion type symptoms -Continue preadmission Pepcid    Tobacco abuse -Counseled regarding cessation    Hyperlipidemia -Continue statin    Peripheral neuropathy -States is following with neurologist as an outpatient    DVT Prophylaxis: Full dose IV heparin  Family Communication:   No family at bedside  Code Status:  Full code  Condition:  Stable  Discharge disposition: Anticipate discharge back to home pending resolution and treatment of chest pain  Time spent in minutes : 60      ELLIS,ALLISON L. ANP on 02/11/2015 at 9:56 AM  Between 7am to 7pm - Pager - [phone]  After 7pm go to [email or web address] - password [email or web address]  And look for the night coverage person covering me after hours  [facility]  Addendum  Patient is a pleasant 59 year old gentleman with a past medical history of coronary artery disease status post percutaneous intervention, morbid obesity, sleep apnea, presenting to the emergency department with complaints of chest pain located left-sided chest that started yesterday evening. He reports that his chest pain worsens with deep inspiration and is alleviated by leaning forward. In the emergency department a chest x-ray did not  reveal acute cardiopulmonary disease. Troponins were negative. EKG showed a few PVCs, no acute ischemic changes. No diffuse ST segment changes. He reports that chest pain is alleviated by leaning 4, one about the possibility of pericarditis. Cardiology has been consulted. Plan further workup with a CT of lungs with contrast to assess for possible PE. Will further workup with transthoracic echocardiogram as well.

## 2015-02-11 NOTE — ED Notes (Signed)
Fortino Sic, PA, at bedside.

## 2015-02-11 NOTE — Consult Note (Signed)
Reason for Consult: Chest pain  Requesting Physician: Coralyn Pear  Cardiologist: [name]  HPI: This is a 59 y.o. male with a past medical history significant for long-standing and extensive problems with coronary artery disease dating back at least 14 years, moderate ischemic cardiomyopathy, obesity and obstructive sleep apnea, hyperlipidemia, type 2 diabetes mellitus.  He presents to the emergency room with chest discomfort that is very distinct from previous angina pectoris. Discomfort is primarily left chest located with radiation towards the shoulder and neck and has both pressure-like and sharp components. It is clearly worsened by lying down and is relieved when he leans forward. It is also clearly pleuritic in nature, worsening with deep breath. He coughed a little bit yesterday, but is no longer coughing. He has not had fever or chills and denies dyspnea. To quickly sleeps on 3 pillows. He denies hemoptysis and has not had swelling of his legs or unilateral calf tenderness. He does not have a history of venous thromboembolic disease.  In the emergency room he is afebrile and his white blood count is normal. No infiltrates are seen on chest x-ray and the mediastinum is narrow. He has not had recent sick contacts.  Most recent CATH 03/15/2014 Final Conclusions:  1. Diffuse CAD. There is severe disease in the small distal LCx and OM2. There is moderate disease in the mid RCA and distal LAD. This has not changed significantly since his prior study. 2. Mild LV dysfunction. EF 50%.  Most recent ECHO 11/17/2013 - Left ventricle: The cavity size was mildly dilated. Wall thickness was increased in a pattern of mild LVH. Systolicfunction was normal. The estimated ejection fraction was in therange of 50% to 55%. Wall motion was normal; there were noregional wall motion abnormalities. Doppler parameters areconsistent with abnormal left ventricular relaxation (grade 1 diastolic  dysfunction). - Left atrium: The atrium was mildly dilated. - Right ventricle: The cavity size was moderately dilated. - Right atrium: The atrium was mildly to moderately dilated.  PMHx:  Past Medical History  Diagnosis Date  . Dyslipidemia   . HTN (hypertension)   . CAD (coronary artery disease)     a. s/p PCI/BMS pRCA 2002; b. PCI pRCA & DES p/m RCA 2006; c. PCI/DES OM4 2007; d. PCI/CBA to RCA for ISR 10/2006; e.08/2012 STEMI/Cath/PCI: LM nl, LAD95-9m (3.0x20 Promus Prem DES), 95/95 ap LAD (1.0-1.70mm), LCX 30p 95d (sm), LPL1 70-80p, patent stent, LPL2 nl, RCA 30p, 40/70 ISR, EF 35-40%.  . Ischemic cardiomyopathy     a. EF 40%; improved to normal;  b. 08/2012 Echo: EF 50-55%, mod LVH.  [name] GERD (gastroesophageal reflux disease)   . Tobacco abuse   . Obesity   . OSA (obstructive sleep apnea)     Does not use CPAP as of 05/2011  . Depression   . History of pneumonia   . Diabetes mellitus     a. A1c 8.8 08/2012->Metformin initiated. => b. A1c (9/14): 6.6  . Hyperlipidemia   . Myocardial infarction    Past Surgical History  Procedure Laterality Date  . Coronary stent placement  2009  . Coronary angioplasty with stent placement    . Left heart catheterization with coronary angiogram N/A 08/31/2012    Procedure: LEFT HEART CATHETERIZATION WITH CORONARY ANGIOGRAM;  Surgeon: Burnell Blanks, MD;  Location: [facility] CATH LAB;  Service: Cardiovascular;  Laterality: N/A;  . Percutaneous coronary stent intervention (pci-s) Right 08/31/2012    Procedure: PERCUTANEOUS CORONARY STENT INTERVENTION (PCI-S);  Surgeon: Annita Brod  Angelena Form, MD;  Location: [facility] CATH LAB;  Service: Cardiovascular;  Laterality: Right;  . Left heart catheterization with coronary angiogram N/A 11/16/2013    Procedure: LEFT HEART CATHETERIZATION WITH CORONARY ANGIOGRAM;  Surgeon: Blane Ohara, MD;  Location: [facility] CATH LAB;  Service: Cardiovascular;  Laterality: N/A;  . Percutaneous coronary stent intervention (pci-s)  11/16/2013     Procedure: PERCUTANEOUS CORONARY STENT INTERVENTION (PCI-S);  Surgeon: Blane Ohara, MD;  Location: [facility] CATH LAB;  Service: Cardiovascular;;  . Left heart catheterization with coronary angiogram N/A 03/15/2014    Procedure: LEFT HEART CATHETERIZATION WITH CORONARY ANGIOGRAM;  Surgeon: Peter M Martinique, MD;  Location: [facility] CATH LAB;  Service: Cardiovascular;  Laterality: N/A;    FAMHx: Family History  Problem Relation Age of Onset  . Coronary artery disease    . Depression Mother   . Cancer Mother     ovarian  . Hypertension Mother     Died, 24  . Other Father     motor vehicle accident  . Heart attack Neg Hx   . Stroke Neg Hx     SOCHx:  reports that he has been smoking Cigarettes.  He has a 15 pack-year smoking history. He does not have any smokeless tobacco history on file. He reports that he does not drink alcohol or use illicit drugs.  ALLERGIES: Allergies  Allergen Reactions  . Penicillins Other (See Comments)    Unknown childhood reaction    ROS: Constitutional: positive for fatigue, negative for chills, fevers, sweats and weight loss Eyes: negative Ears, nose, mouth, throat, and face: negative Respiratory: positive for cough, dyspnea on exertion and pleurisy/chest pain, negative for hemoptysis, sputum and wheezing Cardiovascular: positive for chest pain, negative for irregular heart beat, lower extremity edema, orthopnea, palpitations, paroxysmal nocturnal dyspnea and syncope Gastrointestinal: negative for abdominal pain, change in bowel habits, dysphagia, melena, nausea and vomiting Genitourinary:negative Integument/breast: negative Hematologic/lymphatic: negative for bleeding, easy bruising and petechiae Musculoskeletal:positive for arthralgias and back pain Neurological: negative for dizziness, gait problems, headaches, seizures, speech problems, tremors and weakness Behavioral/Psych: negative Endocrine: negative for diabetic symptoms including polydipsia,  polyphagia and polyuria Allergic/Immunologic: negative  HOME MEDICATIONS: No current facility-administered medications on file prior to encounter.   Current Outpatient Prescriptions on File Prior to Encounter  Medication Sig Dispense Refill  . amLODipine (NORVASC) 10 MG tablet Take 10 mg by mouth daily.    [name] aspirin 81 MG tablet Take 81 mg by mouth daily.    . Blood Glucose Monitoring Suppl (ACCU-CHEK AVIVA PLUS) W/DEVICE KIT Use as directed to check blood sugar.  Diagnosis code E11.9 1 kit 0  . carvedilol (COREG) 3.125 MG tablet Take 1 tablet (3.125 mg total) by mouth 2 (two) times daily with a meal. 60 tablet 6  . cloNIDine (CATAPRES) 0.2 MG tablet TAKE 1 TABLET BY MOUTH TWICE DAILY 60 tablet 11  . clopidogrel (PLAVIX) 75 MG tablet Take 1 tablet (75 mg total) by mouth every morning. 90 tablet 3  . escitalopram (LEXAPRO) 10 MG tablet TAKE 1 TABLET BY MOUTH ONCE DAILY 90 tablet 2  . famotidine (PEPCID) 40 MG tablet Take 40 mg by mouth every 12 (twelve) hours.     . furosemide (LASIX) 40 MG tablet TAKE 1& 1/2 TABLET BY MOUTH TWICE DAILY (Patient taking differently: TAKE 2 TABLETS TWICE DAILY.) 270 tablet 3  . glucose blood (ACCU-CHEK AVIVA) test strip Use as directed three times daily to check blood sugar.  Diagnosis code E11.9 300 each 3  . isosorbide  mononitrate (IMDUR) 30 MG 24 hr tablet Take 3 tablets (90 mg total) by mouth daily. 120 tablet 6  . Lancets (ACCU-CHEK MULTICLIX) lancets Use as directed three times daily to check blood sugar.  Diagnosis code E11.9 300 each 3  . losartan (COZAAR) 100 MG tablet Take 1 tablet (100 mg total) by mouth daily. 90 tablet 3  . metFORMIN (GLUCOPHAGE-XR) 500 MG 24 hr tablet Take 1,000 mg by mouth daily with breakfast.     . Multiple Vitamins-Minerals (MULTIVITAMIN WITH MINERALS) tablet Take 1 tablet by mouth daily. CENTRUM SILVER MENS  50 PLUS    . nitroGLYCERIN (NITROSTAT) 0.4 MG SL tablet Place 1 tablet (0.4 mg total) under the tongue every 5 (five)  minutes as needed for chest pain. For chest pain 25 tablet 2  . potassium chloride SA (K-DUR,KLOR-CON) 20 MEQ tablet Take 2 tablets (40 mEq total) by mouth 2 (two) times daily. 270 tablet 5  . pravastatin (PRAVACHOL) 80 MG tablet TAKE 1 TABLET BY MOUTH EVERY EVENING 90 tablet 2    HOSPITAL MEDICATIONS: I have reviewed the patient's current medications. Scheduled: . amLODipine  10 mg Oral Daily  . carvedilol  3.125 mg Oral BID WC  . cloNIDine  0.2 mg Oral BID  . clopidogrel  75 mg Oral q morning - 10a  . escitalopram  10 mg Oral Daily  . furosemide  60 mg Oral BID  . heparin  4,000 Units Intravenous Once  . insulin aspart  0-20 Units Subcutaneous TID WC  . insulin aspart  0-5 Units Subcutaneous QHS  . losartan  100 mg Oral Daily  . pravastatin  80 mg Oral QPM    VITALS: Blood pressure 180/98, pulse 72, temperature 97.9 F (36.6 C), temperature source Oral, resp. rate 24, height $RemoveBe'6\' 1"'IPselzHAY$  (1.854 m), weight 316 lb (143.337 kg), SpO2 93 %.  PHYSICAL EXAM:  General: Alert, oriented x3, no distress Head: no evidence of trauma, PERRL, EOMI, no exophtalmos or lid lag, no myxedema, no xanthelasma; normal ears, nose and oropharynx Neck: 5-6 cm jugular venous pulsations and no hepatojugular reflux; brisk carotid pulses without delay and no carotid bruits Chest: clear to auscultation, no signs of consolidation by percussion or palpation, normal fremitus, symmetrical and full respiratory excursions Cardiovascular: normal position and quality of the apical impulse, regular rhythm, normal first heart sound and widely split second heart sound, no gallops, no murmur, I think he may have a faint pericardial rub heard best immediately to the left of the sternal border in the fifth intercostal space Abdomen:  difficult to examine due to obesity, no tenderness or distention, no masses by palpation, no abnormal pulsatility or arterial bruits, normal bowel sounds, no hepatosplenomegaly Extremities: no  clubbing, cyanosis;  no edema; negative Homan's bilaterally; 2+ radial, ulnar and brachial pulses bilaterally; 2+ right femoral, posterior tibial and dorsalis pedis pulses; 2+ left femoral, posterior tibial and dorsalis pedis pulses; no subclavian or femoral bruits Neurological: grossly nonfocal   LABS  CBC  Recent Labs  02/11/15 0538  WBC 8.4  HGB 15.1  HCT 45.5  MCV 86.8  PLT 196   Basic Metabolic Panel  Recent Labs  02/11/15 0538 02/11/15 0728  NA 139  --   K 3.5  --   CL 107  --   CO2 23  --   GLUCOSE 136*  --   BUN 14  --   CREATININE 1.05  --   CALCIUM 9.0  --   MG  --  2.1  IMAGING: Dg Chest 2 View  02/11/2015   CLINICAL DATA:  Chest pain beginning at 3 p.m. 02/10/2015.  EXAM: CHEST  2 VIEW  COMPARISON:  PA and lateral chest 03/12/2014, 01/20/2013 and 11/13/2013.  FINDINGS: Mild, streaky bibasilar airspace opacities are chronic. The lungs are otherwise clear. Heart size is upper normal. No pneumothorax or pleural effusion. No focal bony abnormality.  IMPRESSION: No acute disease.   Electronically Signed   By: Inge Rise M.D.   On: 02/11/2015 07:45    ECG: Sinus rhythm, chronic right bundle branch block, occasional PACs  TELEMETRY:  sinus rhythm, occasional PACs  IMPRESSION: 1. Pleuritic chest pain, very distinct from previous angina pectoris, associated with cough raises concern for possible pulmonary embolism/infarction in this obese gentleman, but is more suggestive of acute pericarditis. 2. Widespread and severe coronary artery disease, without high risk features for acute coronary syndrome by history, ECG or biochemical markers 3. Severe hypertension on multiple agents 4. Mild ischemic cardiomyopathy, without features to suggest acute heart failure  RECOMMENDATION: 1. D-dimer; IV heparin and CT angio if abnormal 2. Echo 3. Repeat cardiac enzymes reasonable, unlikely to be reveling  Time Spent Directly with Patient: 45 minutes  Sanda Klein,  MD, [facility] [phone] office [phone] pager   02/11/2015, 10:29 AM

## 2015-02-12 DIAGNOSIS — R0781 Pleurodynia: Secondary | ICD-10-CM | POA: Diagnosis not present

## 2015-02-12 DIAGNOSIS — I5032 Chronic diastolic (congestive) heart failure: Secondary | ICD-10-CM | POA: Diagnosis not present

## 2015-02-12 DIAGNOSIS — I200 Unstable angina: Secondary | ICD-10-CM | POA: Diagnosis not present

## 2015-02-12 DIAGNOSIS — K219 Gastro-esophageal reflux disease without esophagitis: Secondary | ICD-10-CM | POA: Diagnosis not present

## 2015-02-12 DIAGNOSIS — E1142 Type 2 diabetes mellitus with diabetic polyneuropathy: Secondary | ICD-10-CM | POA: Diagnosis not present

## 2015-02-12 DIAGNOSIS — I255 Ischemic cardiomyopathy: Secondary | ICD-10-CM | POA: Diagnosis not present

## 2015-02-12 DIAGNOSIS — I2510 Atherosclerotic heart disease of native coronary artery without angina pectoris: Secondary | ICD-10-CM | POA: Diagnosis not present

## 2015-02-12 DIAGNOSIS — F17210 Nicotine dependence, cigarettes, uncomplicated: Secondary | ICD-10-CM | POA: Diagnosis not present

## 2015-02-12 DIAGNOSIS — I10 Essential (primary) hypertension: Secondary | ICD-10-CM | POA: Diagnosis not present

## 2015-02-12 DIAGNOSIS — E785 Hyperlipidemia, unspecified: Secondary | ICD-10-CM | POA: Diagnosis not present

## 2015-02-12 LAB — CBC
HCT: 48 % (ref 39.0–52.0)
HEMOGLOBIN: 16.2 g/dL (ref 13.0–17.0)
MCH: 29.5 pg (ref 26.0–34.0)
MCHC: 33.8 g/dL (ref 30.0–36.0)
MCV: 87.4 fL (ref 78.0–100.0)
PLATELETS: 206 10*3/uL (ref 150–400)
RBC: 5.49 MIL/uL (ref 4.22–5.81)
RDW: 16 % — ABNORMAL HIGH (ref 11.5–15.5)
WBC: 10.1 10*3/uL (ref 4.0–10.5)

## 2015-02-12 LAB — GLUCOSE, CAPILLARY
GLUCOSE-CAPILLARY: 102 mg/dL — AB (ref 65–99)
GLUCOSE-CAPILLARY: 106 mg/dL — AB (ref 65–99)

## 2015-02-12 LAB — HEMOGLOBIN A1C
HEMOGLOBIN A1C: 7 % — AB (ref 4.8–5.6)
Mean Plasma Glucose: 154 mg/dL

## 2015-02-12 LAB — HEPARIN LEVEL (UNFRACTIONATED): HEPARIN UNFRACTIONATED: 0.3 [IU]/mL (ref 0.30–0.70)

## 2015-02-12 LAB — TROPONIN I: TROPONIN I: 0.04 ng/mL — AB (ref <0.031)

## 2015-02-12 MED ORDER — FUROSEMIDE 40 MG PO TABS: ORAL_TABLET | ORAL | Status: DC

## 2015-02-12 MED ORDER — ENOXAPARIN SODIUM 80 MG/0.8ML ~~LOC~~ SOLN
75.0000 mg | SUBCUTANEOUS | Status: DC
  Administered 2015-02-12: 10:00:00 via SUBCUTANEOUS
  Filled 2015-02-12: qty 0.8

## 2015-02-12 MED ORDER — HEPARIN (PORCINE) IN NACL 100-0.45 UNIT/ML-% IJ SOLN: 1800.0000 [IU]/h | INTRAMUSCULAR | Status: DC

## 2015-02-12 NOTE — Progress Notes (Addendum)
ANTICOAGULATION CONSULT NOTE - Follow Up Consult  Pharmacy Consult for heparin transition to DVT px lovenox Indication: chest pain/ACS  Allergies  Allergen Reactions  . Penicillins Other (See Comments)    Unknown childhood reaction    Patient Measurements: Height: 6\' 1"  (185.4 cm) Weight: (!) 330 lb 11 oz (150 kg) IBW/kg (Calculated) : 79.9 Heparin Dosing Weight: 112  Vital Signs: Temp: 98.1 F (36.7 C) (09/03 0804) Temp Source: Oral (09/03 0804) BP: 135/86 mmHg (09/03 0804) Pulse Rate: 64 (09/03 0804)  Labs:  Recent Labs  02/11/15 0538 02/11/15 1051 02/11/15 1726 02/11/15 2315 02/12/15 0240  HGB 15.1  --   --   --  16.2  HCT 45.5  --   --   --  48.0  PLT 206  --   --   --  206  HEPARINUNFRC  --   --  0.32  --  0.30  CREATININE 1.05  --   --   --   --   TROPONINI  --  0.03 0.06* 0.04*  --     Estimated Creatinine Clearance: 118.5 mL/min (by C-G formula based on Cr of 1.05).   Medications:  Scheduled:  . amLODipine  10 mg Oral Daily  . aspirin EC  81 mg Oral Daily  . carvedilol  3.125 mg Oral BID WC  . cloNIDine  0.3 mg Oral BID  . clopidogrel  75 mg Oral q morning - 10a  . escitalopram  10 mg Oral Daily  . famotidine  40 mg Oral Q12H  . furosemide  60 mg Oral BID  . Influenza vac split quadrivalent PF  0.5 mL Intramuscular Tomorrow-1000  . insulin aspart  0-20 Units Subcutaneous TID WC  . insulin aspart  0-5 Units Subcutaneous QHS  . losartan  100 mg Oral Daily  . multivitamin with minerals  1 tablet Oral Daily  . nitroGLYCERIN  1 inch Topical 4 times per day  . pneumococcal 23 valent vaccine  0.5 mL Intramuscular Tomorrow-1000  . potassium chloride SA  40 mEq Oral BID  . pravastatin  80 mg Oral QPM   Infusions:  . heparin 1,750 Units/hr (02/11/15 2309)    Assessment: 59 yo male admitted on 02/11/2015 with CP on heparin for possible ACS/PE. D-dimer= 0.43, troponin max= 0.06 and initial heparin level was at goal (HL= 0.32) on 1750 units/hr.  HL now  remains at lower end of goal range at 0.30. CBC remains wnl and stable with no noted s/s bleeding.   Goal of Therapy:  Heparin level 0.3-0.7 units/ml Monitor platelets by anticoagulation protocol: Yes   Plan:   - Increase heparin gtt slightly to 1800 units/hr to maintain at goal  - Daily HL/CBC - F/u plans for diagnostic cath  [facility] K. Velva Harman, PharmD, BCPS, CPP Clinical Pharmacist Pager: [phone] Phone: [phone] 02/12/2015 8:14 AM   ADDENDUM: - Plan to transition to DVT prophylaxis lovenox as low suspicion for ACS or PE  - D/c heparin gtt - Initiate lovenox 75 mg (0.5 mg/kg) subcut q24h adjusted for BMI >30 - Continue to monitor CBC and for s/s bleeding  Demya Scruggs K. Velva Harman, PharmD, BCPS, CPP Clinical Pharmacist Pager: [phone] Phone: [phone] 02/12/2015 9:09 AM

## 2015-02-12 NOTE — Progress Notes (Signed)
    Subjective:  Denies CP or dyspnea   Objective:  Filed Vitals:   02/12/15 0306 02/12/15 0410 02/12/15 0452 02/12/15 0804  BP: 154/73 115/43  135/86  Pulse: 62 62  64  Temp:   98.1 F (36.7 C) 98.1 F (36.7 C)  TempSrc:   Oral Oral  Resp: 12 21  11   Height:      Weight:      SpO2: 94% 91%  93%    Intake/Output from previous day:  Intake/Output Summary (Last 24 hours) at 02/12/15 0935 Last data filed at 02/12/15 0807  Gross per 24 hour  Intake 540.71 ml  Output   1885 ml  Net -1344.29 ml    Physical Exam: Physical exam: Well-developed obese in no acute distress.  Skin is warm and dry.  HEENT is normal.  Neck is supple.  Chest is clear to auscultation with normal expansion.  Cardiovascular exam is regular rate and rhythm.  Abdominal exam nontender or distended. No masses palpated. Extremities show no edema. neuro grossly intact    Lab Results: Basic Metabolic Panel:  Recent Labs  02/11/15 0538 02/11/15 0728  NA 139  --   K 3.5  --   CL 107  --   CO2 23  --   GLUCOSE 136*  --   BUN 14  --   CREATININE 1.05  --   CALCIUM 9.0  --   MG  --  2.1   CBC:  Recent Labs  02/11/15 0538 02/12/15 0240  WBC 8.4 10.1  HGB 15.1 16.2  HCT 45.5 48.0  MCV 86.8 87.4  PLT 206 206   Cardiac Enzymes:  Recent Labs  02/11/15 1051 02/11/15 1726 02/11/15 2315  TROPONINI 0.03 0.06* 0.04*     Assessment/Plan:  1 chest pain-symptoms are atypical and unlike his previous cardiac pain. D-dimer is normal. Troponin is minimally elevated but not diagnostic as there is no clear trend. Patient can be discharged from a cardiac standpoint. Would arrange outpatient nuclear study and follow-up with Dr. [name]. 2 hypertension-blood pressure is controlled. Continue preadmission medications. 3 hyperlipidemia-continue statin. 4 tobacco abuse-patient counseled on discontinuing.  Kirk Ruths 02/12/2015, 9:35 AM

## 2015-02-12 NOTE — Discharge Summary (Addendum)
DISCHARGE SUMMARY  [name] [name]  MR#: [id number]  DOB:02-20-1956  Date of Admission: 02/11/2015 Date of Discharge: 02/12/2015  Attending Physician:[name] [name]  Patient's KCL:EXNTZ [name] Reichmann, MD  Consults:  [facility]  Disposition: Discharge home  Follow-up Appts:     Follow-up Information    Follow up with [name] Cower, MD In 1 week.   Specialties:  Internal Medicine, Radiology   Contact information:   [address] [place] [address] [phone]      Discharge Diagnoses: Pleuritic chest pain Coronary Artery Disease  Uncontrolled hypertension Ischemic cardiomyopathy - EF 50-55% June 2015 / Chronic diastolic congestive heart failure Morbid obesity - Body mass index is 43.64 kg/(m^2). - OSA  DM2 GERD Tobacco abuse Hyperlipidemia Peripheral neuropathy  Initial presentation: 59 year old male with history of morbid obesity, severe sleep apnea on nocturnal CPAP, diabetes, peripheral neuropathy, hypertension, dyslipidemia, ongoing tobacco abuse, known ischemic cardiomyopathy with EF improved to 50-55% by last echocardiogram 2015, CAD with prior stents / PCI in 2002, 2006, 2007, 2008, 2014 and 2015 (last catheterization October 2015 NSTEMI - severe disease in the small distal LCx and OM2 and moderate disease in the mid RCA and distal LAD not changed significantly since cath June 2015). At that point medical therapy was continued and his beta blocker was reduced because of bradycardia. He presented to the ED with reports of waxing and waning chest pain that began at rest while driving his vehicle. Pain increased when supine and with taking very deep breaths. He eventually called EMS and was given nitroglycerin in route. None of these measures seemed to improve his discomfort.   In the ER EKG revealed sinus rhythm with a few multifocal PVCs, right bundle branch block, no definitive ST or [name]-wave changes, and no changes from previous EKGs. Laboratory data included  normal troponin of 0.04.   Hospital Course:  Pleuritic chest pain -d-dimer normal making PE highly unlikely - troponin essentially negative x 3 - no EKG findings to strongly suggest pericarditis - Cardiology evaluated and did not feel that further inpatient cardiac workup was indicated  Coronary Artery Disease Widespread and severe - current sx not c/w prior episodes of angina - pain resolved prior to discharge home  Uncontrolled hypertension -BP currently reasonably controlled on home regimen    Ischemic cardiomyopathy - EF 50-55% June 2015 / Chronic diastolic congestive heart failure -Systolic dysfunction has improved with medical therapy for ischemia -Both systolic and diastolic components appear compensated presently  -Continue medical therapy  Morbid obesity - Body mass index is 43.64 kg/(m^2). - OSA  -Continue nocturnal CPAP  DM2 -A1c 7.0 - CBG currently well controlled   GERD -Continue preadmission Pepcid  Tobacco abuse -Counseled regarding cessation  Hyperlipidemia -Continue statin  Peripheral neuropathy -under the care of a Neurologist as an outpatien    Medication List    TAKE these medications        ACCU-CHEK AVIVA PLUS W/DEVICE Kit  Use as directed to check blood sugar.  Diagnosis code E11.9     accu-chek multiclix lancets  Use as directed three times daily to check blood sugar.  Diagnosis code E11.9     amLODipine 10 MG tablet  Commonly known as:  NORVASC  Take 10 mg by mouth daily.     aspirin 81 MG tablet  Take 81 mg by mouth daily.     carvedilol 3.125 MG tablet  Commonly known as:  COREG  Take 1 tablet (3.125 mg total) by mouth 2 (two) times daily  with a meal.     cloNIDine 0.2 MG tablet  Commonly known as:  CATAPRES  TAKE 1 TABLET BY MOUTH TWICE DAILY     clopidogrel 75 MG tablet  Commonly known as:  PLAVIX  Take 1 tablet (75 mg total) by mouth every morning.     escitalopram 10 MG tablet  Commonly known as:  LEXAPRO  TAKE 1  TABLET BY MOUTH ONCE DAILY     famotidine 40 MG tablet  Commonly known as:  PEPCID  Take 40 mg by mouth every 12 (twelve) hours.     furosemide 40 MG tablet  Commonly known as:  LASIX  TAKE 2 TABLETS TWICE DAILY.     glucose blood test strip  Commonly known as:  ACCU-CHEK AVIVA  Use as directed three times daily to check blood sugar.  Diagnosis code E11.9     isosorbide mononitrate 30 MG 24 hr tablet  Commonly known as:  IMDUR  Take 3 tablets (90 mg total) by mouth daily.     losartan 100 MG tablet  Commonly known as:  COZAAR  Take 1 tablet (100 mg total) by mouth daily.     metFORMIN 500 MG 24 hr tablet  Commonly known as:  GLUCOPHAGE-XR  Take 1,000 mg by mouth daily with breakfast.     multivitamin with minerals tablet  Take 1 tablet by mouth daily. CENTRUM SILVER MENS  50 PLUS     nitroGLYCERIN 0.4 MG SL tablet  Commonly known as:  NITROSTAT  Place 1 tablet (0.4 mg total) under the tongue every 5 (five) minutes as needed for chest pain. For chest pain     potassium chloride SA 20 MEQ tablet  Commonly known as:  K-DUR,KLOR-CON  Take 2 tablets (40 mEq total) by mouth 2 (two) times daily.     pravastatin 80 MG tablet  Commonly known as:  PRAVACHOL  TAKE 1 TABLET BY MOUTH EVERY EVENING       Day of Discharge BP 135/86 mmHg  Pulse 64  Temp(Src) 98.1 F (36.7 C) (Oral)  Resp 11  Ht $R'6\' 1"'UX$  (1.854 m)  Wt 150 kg (330 lb 11 oz)  BMI 43.64 kg/m2  SpO2 93%  Physical Exam: General: No acute respiratory distress Lungs: Clear to auscultation bilaterally without wheezes or crackles Cardiovascular: Regular rate and rhythm without murmur gallop or rub normal S1 and S2 Abdomen: Nontender, nondistended, soft, bowel sounds positive, no rebound, no ascites, no appreciable mass Extremities: No significant cyanosis, clubbing, or edema bilateral lower extremities  Basic Metabolic Panel:  Recent Labs Lab 02/11/15 0538 02/11/15 0728  NA 139  --   K 3.5  --   CL 107  --     CO2 23  --   GLUCOSE 136*  --   BUN 14  --   CREATININE 1.05  --   CALCIUM 9.0  --   MG  --  2.1   CBC:  Recent Labs Lab 02/11/15 0538 02/12/15 0240  WBC 8.4 10.1  HGB 15.1 16.2  HCT 45.5 48.0  MCV 86.8 87.4  PLT 206 206    Cardiac Enzymes:  Recent Labs Lab 02/11/15 1051 02/11/15 1726 02/11/15 2315  TROPONINI 0.03 0.06* 0.04*    CBG:  Recent Labs Lab 02/11/15 1211 02/11/15 1637 02/11/15 2138 02/12/15 0804  GLUCAP 126* 110* 134* 102*    Recent Results (from the past 240 hour(s))  MRSA PCR Screening     Status: None   Collection Time: 02/11/15 11:44  AM  Result Value Ref Range Status   MRSA by PCR NEGATIVE NEGATIVE Final    Comment:        The GeneXpert MRSA Assay (FDA approved for NASAL specimens only), is one component of a comprehensive MRSA colonization surveillance program. It is not intended to diagnose MRSA infection nor to guide or monitor treatment for MRSA infections.       Time spent in discharge (includes decision making & examination of pt): 30 minutes  02/12/2015, 11:25 AM   Cherene Altes, MD [facility] Office  [phone] Pager [phone]  On-Call/Text Page:      [email or web address]      password [facility]

## 2015-02-12 NOTE — Discharge Instructions (Signed)

## 2015-02-15 ENCOUNTER — Encounter: Payer: Self-pay | Admitting: Cardiology

## 2015-02-22 DIAGNOSIS — G4733 Obstructive sleep apnea (adult) (pediatric): Secondary | ICD-10-CM | POA: Diagnosis not present

## 2015-03-07 ENCOUNTER — Ambulatory Visit: Payer: Commercial Managed Care - HMO | Admitting: Cardiology

## 2015-03-09 ENCOUNTER — Encounter: Payer: Self-pay | Admitting: Cardiology

## 2015-03-09 DIAGNOSIS — I10 Essential (primary) hypertension: Secondary | ICD-10-CM | POA: Insufficient documentation

## 2015-03-09 NOTE — Progress Notes (Signed)
Cardiology Office Note   Date:  03/10/2015   ID:  [name] [name], DOB [birth year], MRN [id number]  PCP:  Cathlean Cower, MD    Chief Complaint  Patient presents with  . OSA      History of Present Illness: [name] [name] is a 59 y.o. male who presents for evaluation of OSA.  He was recently referred for sleep study by Dr. [name] due to snoring, nonrestorative sleep and excessive daytime sleepiness.  He has morbid obesity.  PSG showed severe OSA with an AHI of 48/hr mainly occurring during NREM sleep and all occurred in the nonsupine position.  He underwent CPAP titration to 12cm H2O.  He was started on CPAP and now presents for followup.  He is doing well with his CPAP device.  He tolerates his nasal mask very well but says that he has problems pulling it off in his sleep and does not remember doing it.  He feels the pressure is adequate.  Since going on CPAP he sleeps better at night and feels more refreshed in the am.  His daytime sleepiness has improved but still sleepy some.  He has some mouth dryness in the am.        Past Medical History  Diagnosis Date  . Dyslipidemia   . HTN (hypertension)   . CAD (coronary artery disease)     a. s/p PCI/BMS pRCA 2002; b. PCI pRCA & DES p/m RCA 2006; c. PCI/DES OM4 2007; d. PCI/CBA to RCA for ISR 10/2006; e.08/2012 STEMI/Cath/PCI: LM nl, LAD95-102m (3.0x20 Promus Prem DES), 95/95 ap LAD (1.0-1.79mm), LCX 30p 95d (sm), LPL1 70-80p, patent stent, LPL2 nl, RCA 30p, 40/70 ISR, EF 35-40%.  . Ischemic cardiomyopathy     a. EF 40%; improved to normal;  b. 08/2012 Echo: EF 50-55%, mod LVH.  [name] GERD (gastroesophageal reflux disease)   . Tobacco abuse   . Obesity   . OSA (obstructive sleep apnea)     Does not use CPAP as of 05/2011  . Depression   . History of pneumonia   . Diabetes mellitus     a. A1c 8.8 08/2012->Metformin initiated. => b. A1c (9/14): 6.6  . Hyperlipidemia   . Myocardial infarction   . Benign essential HTN 03/09/2015     Past Surgical History  Procedure Laterality Date  . Coronary stent placement  2009  . Coronary angioplasty with stent placement    . Left heart catheterization with coronary angiogram N/A 08/31/2012    Procedure: LEFT HEART CATHETERIZATION WITH CORONARY ANGIOGRAM;  Surgeon: Burnell Blanks, MD;  Location: [facility] CATH LAB;  Service: Cardiovascular;  Laterality: N/A;  . Percutaneous coronary stent intervention (pci-s) Right 08/31/2012    Procedure: PERCUTANEOUS CORONARY STENT INTERVENTION (PCI-S);  Surgeon: Burnell Blanks, MD;  Location: [facility] CATH LAB;  Service: Cardiovascular;  Laterality: Right;  . Left heart catheterization with coronary angiogram N/A 11/16/2013    Procedure: LEFT HEART CATHETERIZATION WITH CORONARY ANGIOGRAM;  Surgeon: Blane Ohara, MD;  Location: [facility] CATH LAB;  Service: Cardiovascular;  Laterality: N/A;  . Percutaneous coronary stent intervention (pci-s)  11/16/2013    Procedure: PERCUTANEOUS CORONARY STENT INTERVENTION (PCI-S);  Surgeon: Blane Ohara, MD;  Location: [facility] CATH LAB;  Service: Cardiovascular;;  . Left heart catheterization with coronary angiogram N/A 03/15/2014    Procedure: LEFT HEART CATHETERIZATION WITH CORONARY ANGIOGRAM;  Surgeon: Peter M Martinique, MD;  Location: [facility] CATH LAB;  Service: Cardiovascular;  Laterality: N/A;     Current Outpatient Prescriptions  Medication Sig Dispense Refill  . amLODipine (NORVASC) 10 MG tablet Take 10 mg by mouth daily.    [name] aspirin 81 MG tablet Take 81 mg by mouth daily.    . Blood Glucose Monitoring Suppl (ACCU-CHEK AVIVA PLUS) W/DEVICE KIT Use as directed to check blood sugar.  Diagnosis code E11.9 1 kit 0  . carvedilol (COREG) 3.125 MG tablet Take 1 tablet (3.125 mg total) by mouth 2 (two) times daily with a meal. 60 tablet 6  . cloNIDine (CATAPRES) 0.2 MG tablet TAKE 1 TABLET BY MOUTH TWICE DAILY 60 tablet 11  . clopidogrel (PLAVIX) 75 MG tablet Take 1 tablet (75 mg total) by mouth every morning. 90 tablet 3   . escitalopram (LEXAPRO) 10 MG tablet TAKE 1 TABLET BY MOUTH ONCE DAILY 90 tablet 2  . famotidine (PEPCID) 40 MG tablet Take 40 mg by mouth every 12 (twelve) hours.     . furosemide (LASIX) 80 MG tablet Take 80 mg by mouth 2 (two) times daily.  5  . glucose blood (ACCU-CHEK AVIVA) test strip Use as directed three times daily to check blood sugar.  Diagnosis code E11.9 300 each 3  . isosorbide mononitrate (IMDUR) 30 MG 24 hr tablet Take 3 tablets (90 mg total) by mouth daily. 120 tablet 6  . Lancets (ACCU-CHEK MULTICLIX) lancets Use as directed three times daily to check blood sugar.  Diagnosis code E11.9 300 each 3  . losartan (COZAAR) 100 MG tablet Take 1 tablet (100 mg total) by mouth daily. 90 tablet 3  . metFORMIN (GLUCOPHAGE-XR) 500 MG 24 hr tablet Take 1,000 mg by mouth daily with breakfast.     . Multiple Vitamins-Minerals (MULTIVITAMIN WITH MINERALS) tablet Take 1 tablet by mouth daily. CENTRUM SILVER MENS  50 PLUS    . nitroGLYCERIN (NITROSTAT) 0.4 MG SL tablet Place 1 tablet (0.4 mg total) under the tongue every 5 (five) minutes as needed for chest pain. For chest pain 25 tablet 2  . potassium chloride SA (K-DUR,KLOR-CON) 20 MEQ tablet Take 2 tablets (40 mEq total) by mouth 2 (two) times daily. 270 tablet 5  . pravastatin (PRAVACHOL) 80 MG tablet TAKE 1 TABLET BY MOUTH EVERY EVENING 90 tablet 2   No current facility-administered medications for this visit.    Allergies:   Penicillins    Social History:  The patient  reports that he has been smoking Cigarettes.  He has a 15 pack-year smoking history. He does not have any smokeless tobacco history on file. He reports that he does not drink alcohol or use illicit drugs.   Family History:  The patient's family history includes Cancer in his mother; Coronary artery disease in an other family member; Depression in his mother; Hypertension in his mother; Other in his father. There is no history of Heart attack or Stroke.    ROS:  Please  see the history of present illness.   Otherwise, review of systems are positive for none.   All other systems are reviewed and negative.    PHYSICAL EXAM: VS:  BP 142/98 mmHg  Pulse 66  Ht 6\' 1"  (1.854 m)  Wt 333 lb 12.8 oz (151.411 kg)  BMI 44.05 kg/m2  SpO2 90% , BMI Body mass index is 44.05 kg/(m^2). GEN: Well nourished, well developed, in no acute distress HEENT: normal Neck: no JVD, carotid bruits, or masses Cardiac: RRR; no murmurs, rubs, or gallops,no edema  Respiratory:  clear to auscultation bilaterally, normal work of breathing GI: soft, nontender, nondistended, + BS MS: no deformity or atrophy Skin: warm and dry, no rash Neuro:  Strength and sensation are intact Psych: euthymic mood, full affect   EKG:  EKG is not ordered today.    Recent Labs: 07/16/2014: Pro B Natriuretic peptide (BNP) 73.0 02/11/2015: BUN 14; Creatinine, Ser 1.05; Magnesium 2.1; Potassium 3.5; Sodium 139 02/12/2015: Hemoglobin 16.2; Platelets 206    Lipid Panel    Component Value Date/Time   CHOL 129 03/13/2014 0334   TRIG 88 03/13/2014 0334   HDL 44 03/13/2014 0334   CHOLHDL 2.9 03/13/2014 0334   VLDL 18 03/13/2014 0334   LDLCALC 67 03/13/2014 0334      Wt Readings from Last 3 Encounters:  03/10/15 333 lb 12.8 oz (151.411 kg)  02/11/15 330 lb 11 oz (150 kg)  11/19/14 342 lb (155.13 kg)        ASSESSMENT AND PLAN:  1. Severe OSA on CPAP and tolerating well but keeps taking his mask off in his sleep. I am going to order a nasal pillow mask with chin strap to see if he can keep that on any better.  I will recheck a d/l in 4 weeks.  He knows he needs to be 70% compliant in using more than 4 hours nightly for insurance to pay for his device.  Hopefully with the pillow mask he will not take it off in his sleep.   Patient has been using and benefiting from CPAP use and will continue to benefit from therapy. I have also instructed the patient on proper sleep hygiene, avoidance of sleeping in  the supine position and avoidance of alcohol within 4 hours of bedtime.  The patient was also instructed to avoid driving if sleepy.   2.  Morbid obesity - I have encouraged him to try to increase his exercise and follow a low carb diet with portion control.  He has lost 9lbs since last OV 3.  HTN - borderline controlled but he has not taken his meds yet this am.     Current medicines are reviewed at length with the patient today.  The patient does not have concerns regarding medicines.  The following changes have been made:  no change  Labs/ tests ordered today: See above Assessment and Plan No orders of the defined types were placed in this encounter.     Disposition:   FU with me in 6 months  Signed, Sueanne Margarita, MD  03/10/2015 9:37 AM    [facility] [place], [place], [place]  [address] Phone: [phone]; Fax: [phone]

## 2015-03-10 ENCOUNTER — Encounter: Payer: Self-pay | Admitting: Cardiology

## 2015-03-10 ENCOUNTER — Ambulatory Visit (INDEPENDENT_AMBULATORY_CARE_PROVIDER_SITE_OTHER): Payer: Commercial Managed Care - HMO | Admitting: Cardiology

## 2015-03-10 VITALS — BP 142/98 | HR 66 | Ht 73.0 in | Wt 333.8 lb

## 2015-03-10 DIAGNOSIS — G4733 Obstructive sleep apnea (adult) (pediatric): Secondary | ICD-10-CM | POA: Diagnosis not present

## 2015-03-10 DIAGNOSIS — I10 Essential (primary) hypertension: Secondary | ICD-10-CM

## 2015-03-10 NOTE — Patient Instructions (Signed)

## 2015-03-13 ENCOUNTER — Other Ambulatory Visit: Payer: Self-pay | Admitting: Cardiology

## 2015-03-13 ENCOUNTER — Other Ambulatory Visit: Payer: Self-pay | Admitting: Internal Medicine

## 2015-03-19 ENCOUNTER — Other Ambulatory Visit: Payer: Self-pay | Admitting: Internal Medicine

## 2015-03-21 ENCOUNTER — Other Ambulatory Visit: Payer: Self-pay | Admitting: Internal Medicine

## 2015-03-24 ENCOUNTER — Encounter: Payer: Self-pay | Admitting: Cardiology

## 2015-03-24 DIAGNOSIS — G4733 Obstructive sleep apnea (adult) (pediatric): Secondary | ICD-10-CM | POA: Diagnosis not present

## 2015-04-03 ENCOUNTER — Emergency Department (HOSPITAL_COMMUNITY)
Admission: EM | Admit: 2015-04-03 | Discharge: 2015-04-03 | Disposition: A | Discharge status: Home / Self Care | Payer: Commercial Managed Care - HMO | Attending: Emergency Medicine | Admitting: Emergency Medicine

## 2015-04-03 ENCOUNTER — Encounter (HOSPITAL_COMMUNITY): Payer: Self-pay | Admitting: *Deleted

## 2015-04-03 DIAGNOSIS — M25552 Pain in left hip: Secondary | ICD-10-CM | POA: Diagnosis not present

## 2015-04-03 DIAGNOSIS — R200 Anesthesia of skin: Secondary | ICD-10-CM | POA: Insufficient documentation

## 2015-04-03 DIAGNOSIS — K219 Gastro-esophageal reflux disease without esophagitis: Secondary | ICD-10-CM | POA: Diagnosis not present

## 2015-04-03 DIAGNOSIS — M545 Low back pain, unspecified: Secondary | ICD-10-CM

## 2015-04-03 DIAGNOSIS — I10 Essential (primary) hypertension: Secondary | ICD-10-CM | POA: Diagnosis not present

## 2015-04-03 DIAGNOSIS — Z880 Allergy status to penicillin: Secondary | ICD-10-CM | POA: Diagnosis not present

## 2015-04-03 DIAGNOSIS — Z8701 Personal history of pneumonia (recurrent): Secondary | ICD-10-CM | POA: Insufficient documentation

## 2015-04-03 DIAGNOSIS — F329 Major depressive disorder, single episode, unspecified: Secondary | ICD-10-CM | POA: Diagnosis not present

## 2015-04-03 DIAGNOSIS — I252 Old myocardial infarction: Secondary | ICD-10-CM | POA: Insufficient documentation

## 2015-04-03 DIAGNOSIS — Z8669 Personal history of other diseases of the nervous system and sense organs: Secondary | ICD-10-CM | POA: Insufficient documentation

## 2015-04-03 DIAGNOSIS — Z9861 Coronary angioplasty status: Secondary | ICD-10-CM | POA: Insufficient documentation

## 2015-04-03 DIAGNOSIS — Z79899 Other long term (current) drug therapy: Secondary | ICD-10-CM | POA: Diagnosis not present

## 2015-04-03 DIAGNOSIS — I2510 Atherosclerotic heart disease of native coronary artery without angina pectoris: Secondary | ICD-10-CM | POA: Insufficient documentation

## 2015-04-03 DIAGNOSIS — Z7982 Long term (current) use of aspirin: Secondary | ICD-10-CM | POA: Diagnosis not present

## 2015-04-03 DIAGNOSIS — E785 Hyperlipidemia, unspecified: Secondary | ICD-10-CM | POA: Insufficient documentation

## 2015-04-03 DIAGNOSIS — E669 Obesity, unspecified: Secondary | ICD-10-CM | POA: Insufficient documentation

## 2015-04-03 DIAGNOSIS — Z720 Tobacco use: Secondary | ICD-10-CM | POA: Diagnosis not present

## 2015-04-03 DIAGNOSIS — Z7902 Long term (current) use of antithrombotics/antiplatelets: Secondary | ICD-10-CM | POA: Insufficient documentation

## 2015-04-03 DIAGNOSIS — Z9889 Other specified postprocedural states: Secondary | ICD-10-CM | POA: Diagnosis not present

## 2015-04-03 DIAGNOSIS — E119 Type 2 diabetes mellitus without complications: Secondary | ICD-10-CM | POA: Insufficient documentation

## 2015-04-03 MED ORDER — IBUPROFEN 800 MG PO TABS: 800.0000 mg | ORAL_TABLET | Freq: Three times a day (TID) | ORAL | Status: DC
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
## 2015-04-03 NOTE — Discharge Instructions (Signed)
Take your medications as prescribed. You may also continue using a heating pad and take Tylenol as prescribed over-the-counter for pain relief. Follow up with your primary care provider in the next week. Please return to the Emergency Department if symptoms worsen or new onset of fever, numbness, tingling, loss of bowel or bladder, weakness.

## 2015-04-03 NOTE — ED Notes (Signed)
Awake. Verbally responsive. A/O x4. Resp even and unlabored. No audible adventitious breath sounds noted. ABC's intact.  

## 2015-04-03 NOTE — ED Notes (Signed)
Patient from home with intermittent chronic low back pain.  Current episode began around 1 week ago.  Patient states pain is bilateral low back, radiating to left hip.  Patient states he has numbness in left toes.  Patient has not been evaluated for this by orthopod or neuro.  Pain unresponsive to ibuprofen 400 mg q 6 hours.  Patient denies incontinence of urine or stool.

## 2015-04-07 ENCOUNTER — Ambulatory Visit: Payer: Commercial Managed Care - HMO | Admitting: Physician Assistant

## 2015-04-07 ENCOUNTER — Other Ambulatory Visit: Payer: Commercial Managed Care - HMO

## 2015-04-13 ENCOUNTER — Other Ambulatory Visit: Payer: Self-pay | Admitting: Internal Medicine

## 2015-04-13 ENCOUNTER — Ambulatory Visit (INDEPENDENT_AMBULATORY_CARE_PROVIDER_SITE_OTHER): Payer: Commercial Managed Care - HMO | Admitting: Internal Medicine

## 2015-04-13 ENCOUNTER — Encounter: Payer: Self-pay | Admitting: Internal Medicine

## 2015-04-13 VITALS — BP 134/86 | HR 75 | Temp 98.8°F | Ht 73.0 in | Wt 338.0 lb

## 2015-04-13 DIAGNOSIS — M722 Plantar fascial fibromatosis: Secondary | ICD-10-CM | POA: Insufficient documentation

## 2015-04-13 DIAGNOSIS — E785 Hyperlipidemia, unspecified: Secondary | ICD-10-CM | POA: Diagnosis not present

## 2015-04-13 DIAGNOSIS — M5416 Radiculopathy, lumbar region: Secondary | ICD-10-CM | POA: Diagnosis not present

## 2015-04-13 MED ORDER — KETOROLAC TROMETHAMINE 30 MG/ML IM SOLN
30.0000 mg | Freq: Once | INTRAMUSCULAR | Status: AC
  Administered 2015-04-13: 30 mg via INTRAMUSCULAR

## 2015-04-13 MED ORDER — PREDNISONE 10 MG PO TABS: ORAL_TABLET | ORAL | Status: DC

## 2015-04-13 MED ORDER — ROSUVASTATIN CALCIUM 40 MG PO TABS: 40.0000 mg | ORAL_TABLET | Freq: Every day | ORAL | Status: DC

## 2015-04-13 MED ORDER — OXYCODONE HCL 5 MG PO TABS: ORAL_TABLET | ORAL | Status: DC

## 2015-04-13 NOTE — Progress Notes (Signed)
Subjective:    Patient ID: [name] [name], male    DOB: 1955-06-15, 59 y.o.   MRN: [id number]  HPI  Here to f/u left lower back pain, seen at [facility] recent but now worsening. Pt continues to have recurring left LBP  X 2wks sharp, no, bowel or bladder change, fever, wt loss, gait change or falls., but now severe, assoc with LLE pain, numbness but no weakness he is aware.  Nothing hurts to touch.  Worst to twist at the waist such as gettting OOB, laying down and still makes better.  No fever, falls, or trauma. Pt denies chest pain, increased sob or doe, wheezing, orthopnea, PND, increased LE swelling, palpitations, dizziness or syncope.  Pt denies new neurological symptoms such as new headache, or facial or extremity weakness or numbness except for the above.   Pt denies polydipsia, polyuria, Also with bilateral feet pain to heels, worse to walk especially first in the AM, x months, mild to mod. Past Medical History  Diagnosis Date  . Dyslipidemia   . HTN (hypertension)   . CAD (coronary artery disease)     a. s/p PCI/BMS pRCA 2002; b. PCI pRCA & DES p/m RCA 2006; c. PCI/DES OM4 2007; d. PCI/CBA to RCA for ISR 10/2006; e.08/2012 STEMI/Cath/PCI: LM nl, LAD95-44m(3.0x20 Promus Prem DES), 95/95 ap LAD (1.0-1.528m, LCX 30p 95d (sm), LPL1 70-80p, patent stent, LPL2 nl, RCA 30p, 40/70 ISR, EF 35-40%.  . Ischemic cardiomyopathy     a. EF 40%; improved to normal;  b. 08/2012 Echo: EF 50-55%, mod LVH.  . Marland KitchenERD (gastroesophageal reflux disease)   . Tobacco abuse   . Obesity   . OSA (obstructive sleep apnea)     Does not use CPAP as of 05/2011  . Depression   . History of pneumonia   . Diabetes mellitus ([place]    a. A1c 8.8 08/2012->Metformin initiated. => b. A1c (9/14): 6.6  . Hyperlipidemia   . Myocardial infarction ([facility]  . Benign essential HTN 03/09/2015   Past Surgical History  Procedure Laterality Date  . Coronary stent placement  2009  . Coronary angioplasty with stent placement    . Left heart  catheterization with coronary angiogram N/A 08/31/2012    Procedure: LEFT HEART CATHETERIZATION WITH CORONARY ANGIOGRAM;  Surgeon: ChBurnell BlanksMD;  [facility];  Service: Cardiovascular;  Laterality: N/A;  . Percutaneous coronary stent intervention (pci-s) Right 08/31/2012    Procedure: PERCUTANEOUS CORONARY STENT INTERVENTION (PCI-S);  Surgeon: ChBurnell BlanksMD;  [facility];  Service: Cardiovascular;  Laterality: Right;  . Left heart catheterization with coronary angiogram N/A 11/16/2013    Procedure: LEFT HEART CATHETERIZATION WITH CORONARY ANGIOGRAM;  Surgeon: MiBlane OharaMD;  [facility];  Service: Cardiovascular;  Laterality: N/A;  . Percutaneous coronary stent intervention (pci-s)  11/16/2013    Procedure: PERCUTANEOUS CORONARY STENT INTERVENTION (PCI-S);  Surgeon: MiBlane OharaMD;  [facility];  Service: Cardiovascular;;  . Left heart catheterization with coronary angiogram N/A 03/15/2014    Procedure: LEFT HEART CATHETERIZATION WITH CORONARY ANGIOGRAM;  Surgeon: Peter M JoMartiniqueMD;  [facility];  Service: Cardiovascular;  Laterality: N/A;    reports that he has been smoking Cigarettes.  He has a 15 pack-year smoking history. He does not have any smokeless tobacco history on file. He reports that he does not drink alcohol or use illicit drugs. family history includes Cancer in his mother; Coronary artery disease  in an other family member; Depression in his mother; Hypertension in his mother; Other in his father. There is no history of Heart attack or Stroke. Allergies  Allergen Reactions  . Penicillins Other (See Comments)    Unknown childhood reaction   Current Outpatient Prescriptions on File Prior to Visit  Medication Sig Dispense Refill  . amLODipine (NORVASC) 10 MG tablet TAKE 1 TABLET BY MOUTH DAILY 90 tablet 0  . aspirin 81 MG tablet Take 81 mg by mouth daily.    . Blood Glucose Monitoring Suppl  (ACCU-CHEK AVIVA PLUS) W/DEVICE KIT Use as directed to check blood sugar.  Diagnosis code E11.9 1 kit 0  . carvedilol (COREG) 3.125 MG tablet Take 1 tablet (3.125 mg total) by mouth 2 (two) times daily with a meal. 60 tablet 6  . cloNIDine (CATAPRES) 0.2 MG tablet TAKE 1 TABLET BY MOUTH TWICE DAILY 60 tablet 11  . clopidogrel (PLAVIX) 75 MG tablet TAKE 1 TABLET BY MOUTH EVERY MORNING 90 tablet 0  . escitalopram (LEXAPRO) 10 MG tablet TAKE 1 TABLET BY MOUTH ONCE DAILY 90 tablet 2  . famotidine (PEPCID) 40 MG tablet Take 40 mg by mouth every 12 (twelve) hours.     . furosemide (LASIX) 80 MG tablet Take 80 mg by mouth 2 (two) times daily.  5  . glucose blood (ACCU-CHEK AVIVA) test strip Use as directed three times daily to check blood sugar.  Diagnosis code E11.9 300 each 3  . ibuprofen (ADVIL,MOTRIN) 800 MG tablet Take 1 tablet (800 mg total) by mouth 3 (three) times daily. 21 tablet 0  . isosorbide mononitrate (IMDUR) 30 MG 24 hr tablet Take 3 tablets (90 mg total) by mouth daily. 120 tablet 6  . Lancets (ACCU-CHEK MULTICLIX) lancets Use as directed three times daily to check blood sugar.  Diagnosis code E11.9 300 each 3  . losartan (COZAAR) 100 MG tablet Take 1 tablet (100 mg total) by mouth daily. 90 tablet 3  . metFORMIN (GLUCOPHAGE-XR) 500 MG 24 hr tablet TAKE 2 TABLETS BY MOUTH EVERY MORNING 180 tablet 0  . Multiple Vitamins-Minerals (MULTIVITAMIN WITH MINERALS) tablet Take 1 tablet by mouth daily. CENTRUM SILVER MENS  50 PLUS    . nitroGLYCERIN (NITROSTAT) 0.4 MG SL tablet Place 1 tablet (0.4 mg total) under the tongue every 5 (five) minutes as needed for chest pain. For chest pain 25 tablet 2  . potassium chloride SA (K-DUR,KLOR-CON) 20 MEQ tablet Take 2 tablets (40 mEq total) by mouth 2 (two) times daily. 270 tablet 5   No current facility-administered medications on file prior to visit.    Review of Systems  Constitutional: Negative for unusual diaphoresis or night sweats HENT: Negative  for ringing in ear or discharge Eyes: Negative for double vision or worsening visual disturbance.  Respiratory: Negative for choking and stridor.   Gastrointestinal: Negative for vomiting or other signifcant bowel change Genitourinary: Negative for hematuria or change in urine volume.  Musculoskeletal: Negative for other MSK pain or swelling Skin: Negative for color change and worsening wound.  Neurological: Negative for tremors and numbness other than noted  Psychiatric/Behavioral: Negative for decreased concentration or agitation other than above       Objective:   Physical Exam BP 134/86 mmHg  Pulse 75  Temp(Src) 98.8 F (37.1 C) (Oral)  Ht 6' 1" (1.854 m)  Wt 338 lb (153.316 kg)  BMI 44.60 kg/m2  SpO2 95% VS noted,  Constitutional: Pt appears in no significant distress HENT: Head: NCAT.  Right Ear: External ear normal.  Left Ear: External ear normal.  Eyes: . Pupils are equal, round, and reactive to light. Conjunctivae and EOM are normal Neck: Normal range of motion. Neck supple.  Cardiovascular: Normal rate and regular rhythm.   Pulmonary/Chest: Effort normal and breath sounds without rales or wheezing.  Abd:  Soft, NT, ND, + BS Spine nontender + tender over left SI joint area Neurological: Pt is alert. Not confused , motor 4+/5 LLE , 5/5 RLE, decresed sens to LT left, and diminshed patellar reflex Skin: Skin is warm. No rash, no LE edema Psychiatric: Pt behavior is normal. No agitation.      Assessment & Plan:

## 2015-04-13 NOTE — Patient Instructions (Signed)
You had the pain shot today (toradol)  Please take all new medication as prescribed - the pain medicaition (in hardcopy) and prednisone (sent to pharmacy)  Please take all new medication as prescribed - the crestor 40 mg (instead of taking the pravastatin - ok to stop the pravastatin)  Please continue all other medications as before, and refills have been done if requested.  Please have the pharmacy call with any other refills you may need.  Please keep your appointments with your specialists as you may have planned  You will be contacted regarding the referral for: MRI for the lower back, and Neurosurgury, as well as Dr Tamala Julian in this office for your feet

## 2015-04-13 NOTE — Progress Notes (Signed)
Pre visit review using our clinic review tool, if applicable. No additional management support is needed unless otherwise documented below in the visit note. 

## 2015-04-14 NOTE — Assessment & Plan Note (Signed)
Mild to mod,with neuro change, for pain control, predpac asd, refer NS, and MRI LS spine if approved per insurance,  to f/u any worsening symptoms or concerns

## 2015-04-14 NOTE — Assessment & Plan Note (Signed)
Ok for change pravastatin to crestor 40 for better control,  to f/u any worsening symptoms or concerns

## 2015-04-14 NOTE — Assessment & Plan Note (Signed)
Also with oral predpac as above, but also refer Dr Tamala Julian [facility]

## 2015-04-18 ENCOUNTER — Ambulatory Visit (INDEPENDENT_AMBULATORY_CARE_PROVIDER_SITE_OTHER): Payer: Commercial Managed Care - HMO | Admitting: Family Medicine

## 2015-04-18 ENCOUNTER — Other Ambulatory Visit (INDEPENDENT_AMBULATORY_CARE_PROVIDER_SITE_OTHER): Payer: Commercial Managed Care - HMO

## 2015-04-18 ENCOUNTER — Encounter: Payer: Self-pay | Admitting: Family Medicine

## 2015-04-18 ENCOUNTER — Other Ambulatory Visit: Payer: Self-pay | Admitting: Family Medicine

## 2015-04-18 VITALS — BP 128/78 | HR 70 | Ht 73.0 in | Wt 342.0 lb

## 2015-04-18 DIAGNOSIS — M5416 Radiculopathy, lumbar region: Secondary | ICD-10-CM | POA: Diagnosis not present

## 2015-04-18 DIAGNOSIS — M25579 Pain in unspecified ankle and joints of unspecified foot: Secondary | ICD-10-CM | POA: Diagnosis not present

## 2015-04-18 MED ORDER — GABAPENTIN 300 MG PO CAPS: 300.0000 mg | ORAL_CAPSULE | Freq: Every day | ORAL | Status: DC

## 2015-04-18 NOTE — Assessment & Plan Note (Signed)
I do believe the patient is having a left lumbar radiculopathy. Patient has made some mild improvement with the prednisone which tells me likely this is more inflammatory. There is a possibility for herniated this. Patient's previous x-rays from an outside facility only had mild to moderate degenerative disc disease. I do feel that advance imaging is worth it secondary to the radicular nature as well as some weakness of the deep tendon reflexes. Patient will finish with the prednisone and then do a 6 day course of anti-inflammatories. Patient started on gabapentin at night. Patient will come back after the MRI and we will discuss further follow-up. Patient knows which activities to do an which was to avoid. Patient no signs and symptoms and when to seek medical treatment as well.

## 2015-04-18 NOTE — Patient Instructions (Addendum)
Good to see you.  Ice 20 minutes 2 times daily. Usually after activity and before bed. Exercises 3 times a week.  After the prednisone start the duexis 3 times a day for another 6 days Gabapentin 300mg  at night We will get the [facility] and we will then go over the result Once you know when the MRI is make an appointment with me 1-2 days after.

## 2015-04-18 NOTE — Progress Notes (Signed)
[name] [address] [place], [place] [address] Phone: [phone] Subjective:    I'm seeing this patient by the request  of:  Cathlean Cower, MD   CC:  Low back pain with radiation down left leg [id number] [name] [name] is a 59 y.o. male coming in with complaint of  Low back pain. He is had this pain for quite some time and seems to be worsening. It is having pain that seems to be radiating down the posterior aspect of his left leg. Sometimes has weakness with it. Denies any significant numbness that is chronic but does have it intermittently. Patient denies any true injury. Patient has tried seen a chiropractor for this with no significant benefit. Patient has tried now prednisone which has been the only thing that has relieved some of the discomfort. Still though waking him up at night sometimes. Denies any fevers, chills, or any abnormal weight loss. Reflexes severity of the pain as 7 out of 10. Patient's primary care provider and x-rays were taken. Showed mild degenerative disc disease.     Past Medical History  Diagnosis Date  . Dyslipidemia   . HTN (hypertension)   . CAD (coronary artery disease)     a. s/p PCI/BMS pRCA 2002; b. PCI pRCA & DES p/m RCA 2006; c. PCI/DES OM4 2007; d. PCI/CBA to RCA for ISR 10/2006; e.08/2012 STEMI/Cath/PCI: LM nl, LAD95-24m (3.0x20 Promus Prem DES), 95/95 ap LAD (1.0-1.71mm), LCX 30p 95d (sm), LPL1 70-80p, patent stent, LPL2 nl, RCA 30p, 40/70 ISR, EF 35-40%.  . Ischemic cardiomyopathy     a. EF 40%; improved to normal;  b. 08/2012 Echo: EF 50-55%, mod LVH.  [name] GERD (gastroesophageal reflux disease)   . Tobacco abuse   . Obesity   . OSA (obstructive sleep apnea)     Does not use CPAP as of 05/2011  . Depression   . History of pneumonia   . Diabetes mellitus ([place])     a. A1c 8.8 08/2012->Metformin initiated. => b. A1c (9/14): 6.6  . Hyperlipidemia   . Myocardial infarction ([place])   . Benign essential HTN 03/09/2015    Past Surgical History  Procedure Laterality Date  . Coronary stent placement  2009  . Coronary angioplasty with stent placement    . Left heart catheterization with coronary angiogram N/A 08/31/2012    Procedure: LEFT HEART CATHETERIZATION WITH CORONARY ANGIOGRAM;  Surgeon: Burnell Blanks, MD;  Location: [facility] CATH LAB;  Service: Cardiovascular;  Laterality: N/A;  . Percutaneous coronary stent intervention (pci-s) Right 08/31/2012    Procedure: PERCUTANEOUS CORONARY STENT INTERVENTION (PCI-S);  Surgeon: Burnell Blanks, MD;  [facility];  Service: Cardiovascular;  Laterality: Right;  . Left heart catheterization with coronary angiogram N/A 11/16/2013    Procedure: LEFT HEART CATHETERIZATION WITH CORONARY ANGIOGRAM;  Surgeon: Blane Ohara, MD;  Location: [facility] LAB;  Service: Cardiovascular;  Laterality: N/A;  . Percutaneous coronary stent intervention (pci-s)  11/16/2013    Procedure: PERCUTANEOUS CORONARY STENT INTERVENTION (PCI-S);  Surgeon: Blane Ohara, MD;  Location: [facility] LAB;  Service: Cardiovascular;;  . Left heart catheterization with coronary angiogram N/A 03/15/2014    Procedure: LEFT HEART CATHETERIZATION WITH CORONARY ANGIOGRAM;  Surgeon: Peter M Martinique, MD;  Location: [facility] CATH LAB;  Service: Cardiovascular;  Laterality: N/A;   Social History  Substance Use Topics  . Smoking status: Current Every Day Smoker -- 0.50 packs/day for 30 years    Types: Cigarettes  .  Smokeless tobacco: None     Comment: trying to cut down  . Alcohol Use: No   Allergies  Allergen Reactions  . Penicillins Other (See Comments)    Unknown childhood reaction   Family History  Problem Relation Age of Onset  . Coronary artery disease    . Depression Mother   . Cancer Mother     ovarian  . Hypertension Mother     Died, 52  . Other Father     motor vehicle accident  . Heart attack Neg Hx   . Stroke Neg Hx    Allergies  Allergen Reactions  . Penicillins Other (See  Comments)    Unknown childhood reaction     Past medical history, social, surgical and family history all reviewed in electronic medical record.   Review of Systems: No headache, visual changes, nausea, vomiting, diarrhea, constipation, dizziness, abdominal pain, skin rash, fevers, chills, night sweats, weight loss, swollen lymph nodes, body aches, joint swelling, muscle aches, chest pain, shortness of breath, mood changes.   Objective Blood pressure 128/78, pulse 70, height 6\' 1"  (1.854 m), weight 342 lb (155.13 kg), SpO2 94 %.  General: No apparent distress alert and oriented x3 mood and affect normal, dressed appropriately.  HEENT: Pupils equal, extraocular movements intact  Respiratory: Patient's speak in full sentences and does not appear short of breath  Cardiovascular: No lower extremity edema, non tender, no erythema  Skin: Warm dry intact with no signs of infection or rash on extremities or on axial skeleton.  Abdomen: Soft nontender  Neuro: Cranial nerves II through XII are intact, neurovascularly intact in all extremities with 2+ DTRs and 2+ pulses.  Lymph: No lymphadenopathy of posterior or anterior cervical chain or axillae bilaterally.  Gait  Antalgic gait MSK:  Non tender with full range of motion and good stability and symmetric strength and tone of shoulders, elbows, wrist, hip, knee and ankles bilaterally.  Arthritic changes of multiple joints Back Exam:  Inspection: Unremarkable  Motion: Flexion 25 deg, Extension 15 deg, Side Bending to 35 deg bilaterally,  Rotation to 35 deg bilaterally  SLR laying:  Positive left side XSLR laying: Negative  Palpable tenderness: discomfort of the musculature of the L4-L5 on the left side FABER: negative. Sensory change: Gross sensation intact to all lumbar and sacral dermatomes.  Reflexes: 1+ a on left at patellar tendon compared to 2+ on the contralateral side, 1+ at achilles tendons, Babinski's downgoing.  Strength at foot  An  leg    shows 4 out of 5 strength of the quadriceps as well as plantarflexion on the left side compared to full strength on the contralateral side.    procedure note 44920; 15 minutes spent for Therapeutic exercises as stated in above notes.  This included exercises focusing on stretching, strengthening, with significant focus on eccentric aspects.Pelvic tilt/bracing instruction to focus on control of the pelvic girdle and lower abdominal muscles  Glute strengthening exercises, focusing on proper firing of the glutes without engaging the low back muscles Proper stretching techniques for maximum relief for the hamstrings, hip flexors, low back and some rotation where tolerated   Proper technique shown and discussed handout in great detail with ATC.  All questions were discussed and answered.     Impression and Recommendations:     This case required medical decision making of moderate complexity.

## 2015-04-18 NOTE — Progress Notes (Signed)
Pre visit review using our clinic review tool, if applicable. No additional management support is needed unless otherwise documented below in the visit note. 

## 2015-04-18 NOTE — Telephone Encounter (Signed)
Refill done.  

## 2015-04-20 NOTE — Progress Notes (Signed)
Cardiology Office Note   Date:  04/21/2015   ID:  [name] [name], DOB March 14, 1956, MRN [id number]   Patient Care Team: Biagio Borg, MD as PCP - General (Internal Medicine) Sherren Mocha, MD as Consulting Physician (Cardiology) Liliane Shi, PA-C as Physician Assistant (Cardiology) Sueanne Margarita, MD as Consulting Physician (Sleep Medicine)    Chief Complaint  Patient presents with  . Hospitalization Follow-up    Admx 9/16 with chest pain  . Coronary Artery Disease     History of Present Illness: [name] [name] is a 59 y.o. male prior patient of mine from [place] with a hx of CAD, s/p PCI to the RCA and PL1, cutting balloon angioplasty secondary to in-stent restenosis of the RCA in 5/08, ischemic cardiomyopathy with previous EF 40% (improved to normal in the past), diastolic CHF, HTN, HL, OSA.   Admitted 08/2012 with an anterior STEMI treated with a Promus DES to the LAD. He had diffuse residual disease and medical therapy was recommended. EF was 35-40%. Follow up demonstrated improved LVF with EF 50-55%.   Admitted 11/2013 with NSTEMI. Severe stenosis OM1 treated with PCI (2.75x15 mm Xience Alpine DES).  Admitted 03/2014 with NSTEMI. LHC demonstrated severe disease in the small distal LCx and OM2, moderate disease in the mid RCA and distal LAD. This was not changed significantly since his prior study and medical therapy was continued. He had BB reduced 2/2 bradycardia.   Last seen by Dr. [name] 4/16.  Admitted 9/16 with chest pain. D-dimer was negative. Troponin levels remained normal. Echocardiogram demonstrated normal LV function.  He was followed by cardiology and it was recommended that he undergo OP stress testing.   Returns for FU.  He is doing well.  Denies any further chest pain. Denies significant dyspnea.  No orthopnea, PND, edema. No syncope.     Studies/Reports Reviewed Today:  Echo 9/21/6 Inf HK, mild LVH, EF 55%, mild LAE, normal RVF, mild RAE, PASP 35  mmHg  LHC (10/15):  Proximal LAD ectatic, mid LAD stent patent, distal LAD 70% at the apex, proximal and mid circumflex diffuse ectasia, small OM1 with CTO, OM2 stent patent, distal OM2 90%, OM3 30-40%, very distal circumflex 90%, proximal to mid RCA stent patent, mid RCA stent 60-70% ISR, EF 50%, inferobasal akinesis  Echo (6/15):  Mild LVH. EF 50% to 55%. Wall motion was normal. Grade 1 diastolic dysfunction. Left atrium: The atrium was mildly dilated. Right ventricle: The cavity size was moderately dilated. Right atrium: The atrium was mildly to moderately dilated.  Myoview 9/12:  mod scar in base/mid inf and IL segments, slight peri-infarct ischemia - low risk (med Rx continued).  Carotid US (4/13): Bilateral: no ICA stenosis   Past Medical History  Diagnosis Date  . Dyslipidemia   . HTN (hypertension)   . CAD (coronary artery disease)     a. s/p PCI/BMS pRCA 2002; b. PCI pRCA & DES p/m RCA 2006; c. PCI/DES OM4 2007; d. PCI/CBA to RCA for ISR 10/2006; e.08/2012 STEMI/Cath/PCI: LM nl, LAD95-24m(3.0x20 Promus Prem DES), 95/95 ap LAD (1.0-1.563m, LCX 30p 95d (sm), LPL1 70-80p, patent stent, LPL2 nl, RCA 30p, 40/70 ISR, EF 35-40%.  . Ischemic cardiomyopathy     a. EF 40%; improved to normal;  b. 08/2012 Echo: EF 50-55%, mod LVH.  . Marland KitchenERD (gastroesophageal reflux disease)   . Tobacco abuse   . Obesity   . OSA (obstructive sleep apnea)     Does not use CPAP as of 05/2011  .  Depression   . History of pneumonia   . Diabetes mellitus ([place])     a. A1c 8.8 08/2012->Metformin initiated. => b. A1c (9/14): 6.6  . Hyperlipidemia   . Myocardial infarction ([place])   . Benign essential HTN 03/09/2015    Past Surgical History  Procedure Laterality Date  . Coronary stent placement  2009  . Coronary angioplasty with stent placement    . Left heart catheterization with coronary angiogram N/A 08/31/2012    Procedure: LEFT HEART CATHETERIZATION WITH CORONARY ANGIOGRAM;  Surgeon: Burnell Blanks, MD;  Location: [facility] CATH LAB;  Service: Cardiovascular;  Laterality: N/A;  . Percutaneous coronary stent intervention (pci-s) Right 08/31/2012    Procedure: PERCUTANEOUS CORONARY STENT INTERVENTION (PCI-S);  Surgeon: Burnell Blanks, MD;  Location: [facility] CATH LAB;  Service: Cardiovascular;  Laterality: Right;  . Left heart catheterization with coronary angiogram N/A 11/16/2013    Procedure: LEFT HEART CATHETERIZATION WITH CORONARY ANGIOGRAM;  Surgeon: Blane Ohara, MD;  Location: [facility] CATH LAB;  Service: Cardiovascular;  Laterality: N/A;  . Percutaneous coronary stent intervention (pci-s)  11/16/2013    Procedure: PERCUTANEOUS CORONARY STENT INTERVENTION (PCI-S);  Surgeon: Blane Ohara, MD;  Location: [facility] CATH LAB;  Service: Cardiovascular;;  . Left heart catheterization with coronary angiogram N/A 03/15/2014    Procedure: LEFT HEART CATHETERIZATION WITH CORONARY ANGIOGRAM;  Surgeon: Peter M Martinique, MD;  Location: [facility] CATH LAB;  Service: Cardiovascular;  Laterality: N/A;     Current Outpatient Prescriptions  Medication Sig Dispense Refill  . amLODipine (NORVASC) 10 MG tablet TAKE 1 TABLET BY MOUTH DAILY 90 tablet 0  . aspirin 81 MG tablet Take 81 mg by mouth daily.    . Blood Glucose Monitoring Suppl (ACCU-CHEK AVIVA PLUS) W/DEVICE KIT Use as directed to check blood sugar.  Diagnosis code E11.9 1 kit 0  . carvedilol (COREG) 3.125 MG tablet Take 1 tablet (3.125 mg total) by mouth 2 (two) times daily with a meal. 60 tablet 6  . cloNIDine (CATAPRES) 0.3 MG tablet Take 1 tablet (0.3 mg total) by mouth 2 (two) times daily. 60 tablet 11  . clopidogrel (PLAVIX) 75 MG tablet TAKE 1 TABLET BY MOUTH EVERY MORNING 90 tablet 0  . escitalopram (LEXAPRO) 10 MG tablet TAKE 1 TABLET BY MOUTH ONCE DAILY 90 tablet 2  . famotidine (PEPCID) 40 MG tablet Take 40 mg by mouth every 12 (twelve) hours.     . furosemide (LASIX) 80 MG tablet Take 80 mg by mouth 2 (two) times daily.  5  . gabapentin (NEURONTIN)  300 MG capsule TAKE 1 CAPSULE(300 MG) BY MOUTH AT BEDTIME 90 capsule 1  . glucose blood (ACCU-CHEK AVIVA) test strip Use as directed three times daily to check blood sugar.  Diagnosis code E11.9 300 each 3  . isosorbide mononitrate (IMDUR) 30 MG 24 hr tablet Take 3 tablets (90 mg total) by mouth daily. 120 tablet 6  . Lancets (ACCU-CHEK MULTICLIX) lancets Use as directed three times daily to check blood sugar.  Diagnosis code E11.9 300 each 3  . losartan (COZAAR) 100 MG tablet Take 1 tablet (100 mg total) by mouth daily. 90 tablet 3  . metFORMIN (GLUCOPHAGE-XR) 500 MG 24 hr tablet TAKE 2 TABLETS BY MOUTH EVERY MORNING 180 tablet 0  . Multiple Vitamins-Minerals (MULTIVITAMIN WITH MINERALS) tablet Take 1 tablet by mouth daily. CENTRUM SILVER MENS  50 PLUS    . nitroGLYCERIN (NITROSTAT) 0.4 MG SL tablet Place 1 tablet (0.4 mg total) under the tongue every  5 (five) minutes as needed for chest pain. For chest pain 25 tablet 2  . oxyCODONE (OXY IR/ROXICODONE) 5 MG immediate release tablet 1-2 every 6 hrs as needed for pain 60 tablet 0  . potassium chloride SA (K-DUR,KLOR-CON) 20 MEQ tablet Take 2 tablets (40 mEq total) by mouth 2 (two) times daily. 270 tablet 5  . predniSONE (DELTASONE) 10 MG tablet 3tab by mouth x3day,2tab x 3day,1tab x 3 days 18 tablet 0  . rosuvastatin (CRESTOR) 40 MG tablet Take 1 tablet (40 mg total) by mouth daily. 90 tablet 3   No current facility-administered medications for this visit.    Allergies:   Penicillins    Social History:   Social History   Social History  . Marital Status: Single    Spouse Name: N/A  . Number of Children: 0  . Years of Education: N/A   Occupational History  . retired    Social History Main Topics  . Smoking status: Current Every Day Smoker -- 0.50 packs/day for 30 years    Types: Cigarettes  . Smokeless tobacco: None     Comment: trying to cut down  . Alcohol Use: No  . Drug Use: No     Comment: remote marijuana use  . Sexual  Activity: Not Currently   Other Topics Concern  . None   Social History Narrative   Lives alone.  He has no children.   He retired early due to health problems.           Family History:   Family History  Problem Relation Age of Onset  . Coronary artery disease    . Depression Mother   . Cancer Mother     ovarian  . Hypertension Mother     Died, 42  . Other Father     motor vehicle accident  . Heart attack Neg Hx   . Stroke Neg Hx       ROS:   Please see the history of present illness.   Review of Systems  All other systems reviewed and are negative.     PHYSICAL EXAM: VS:  BP 148/80 mmHg  Pulse 76  Ht _0  (1.854 m)  Wt 341 lb 12.8 oz (155.039 kg)  BMI 45.10 kg/m2    Wt Readings from Last 3 Encounters:  04/21/15 341 lb 12.8 oz (155.039 kg)  04/18/15 342 lb (155.13 kg)  04/13/15 338 lb (153.316 kg)     GEN: Well nourished, well developed, in no acute distress HEENT: normal Neck: no JVD,  no masses Cardiac:  Normal S1/S2, RRR; no murmur ,  no rubs or gallops, no edema   Respiratory:  clear to auscultation bilaterally, no wheezing, rhonchi or rales. GI: soft, nontender, nondistended, + BS MS: no deformity or atrophy Skin: warm and dry  Neuro:  CNs II-XII intact, Strength and sensation are intact Psych: Normal affect   EKG:  EKG is not ordered today.  It demonstrates:   n/a   Recent Labs: 07/16/2014: Pro B Natriuretic peptide (BNP) 73.0 02/11/2015: BUN 14; Creatinine, Ser 1.05; Magnesium 2.1; Potassium 3.5; Sodium 139 02/12/2015: Hemoglobin 16.2; Platelets 206    Lipid Panel    Component Value Date/Time   CHOL 129 03/13/2014 0334   TRIG 88 03/13/2014 0334   HDL 44 03/13/2014 0334   CHOLHDL 2.9 03/13/2014 0334   VLDL 18 03/13/2014 0334   LDLCALC 67 03/13/2014 0334      ASSESSMENT AND PLAN:  1.Coronary artery disease: S/p multiple  PCI procedures in the setting of ACS/STEMI.  Last LHC in 10/15 with stable anatomy.  He was evaluated in 9/16  with CP and neg enzymes.  Cardiology in the hospital recommended OP nuclear stress test.  This was not done.  However, given his known anatomy, I am not sure a nuclear study would be useful.  It would undoubtedly be abnormal.  He typically presents with elevated CEs when he has progression of disease.  And, he has not had further chest symptoms since the hospital stay in 9/16.  Continue Amlodipine, ASA, beta blocker, nitrates, Plavix, statin.  2.Chronic diastolic congestive heart failure:  Volume stable. Continue current Rx.    3.HTN:  BP remains elevated.  We discussed the need to lose weight and stop smoking. Increase Clonidine to 0.3 mg bid.  Continue Norvasc, carvedilol, isosorbide, losartan.  Arrange FU BMET.  4.Hyperlipidemia: Continue statin.  Arrange FU Lipids and LFTs.  5. Tobacco abuse:  He knows that he needs to quit.   6. OSA (obstructive sleep apnea): Continue CPAP.  He inadvertently removes his mask in his sleep and need his test repeated to get his device covered by Medicare.  Will reorder Split Night.      Medication Changes: Current medicines are reviewed at length with the patient today.  Concerns regarding medicines are as outlined above.  The following changes have been made:   Discontinued Medications   IBUPROFEN (ADVIL,MOTRIN) 800 MG TABLET    Take 1 tablet (800 mg total) by mouth 3 (three) times daily.   Modified Medications   Modified Medication Previous Medication   CLONIDINE (CATAPRES) 0.3 MG TABLET cloNIDine (CATAPRES) 0.2 MG tablet      Take 1 tablet (0.3 mg total) by mouth 2 (two) times daily.    TAKE 1 TABLET BY MOUTH TWICE DAILY   New Prescriptions   No medications on file   Labs/ tests ordered today include:   Orders Placed This Encounter  Procedures  . Basic Metabolic Panel (BMET)  . Lipid Profile  . Hepatic function panel  . Split night study     Disposition:    FU with Dr. Sherren Mocha 4/17 as planned.      Signed, Versie Starks, MHS 04/21/2015 2:39 PM    [facility] [address], [place], [place]  [address] Phone: [phone]; Fax: [phone]

## 2015-04-21 ENCOUNTER — Ambulatory Visit (INDEPENDENT_AMBULATORY_CARE_PROVIDER_SITE_OTHER): Payer: Commercial Managed Care - HMO | Admitting: Physician Assistant

## 2015-04-21 ENCOUNTER — Encounter: Payer: Self-pay | Admitting: Physician Assistant

## 2015-04-21 ENCOUNTER — Other Ambulatory Visit: Payer: Commercial Managed Care - HMO | Admitting: *Deleted

## 2015-04-21 VITALS — BP 148/80 | HR 76 | Ht 73.0 in | Wt 341.8 lb

## 2015-04-21 DIAGNOSIS — G4733 Obstructive sleep apnea (adult) (pediatric): Secondary | ICD-10-CM

## 2015-04-21 DIAGNOSIS — Z720 Tobacco use: Secondary | ICD-10-CM

## 2015-04-21 DIAGNOSIS — I2510 Atherosclerotic heart disease of native coronary artery without angina pectoris: Secondary | ICD-10-CM | POA: Diagnosis not present

## 2015-04-21 DIAGNOSIS — I10 Essential (primary) hypertension: Secondary | ICD-10-CM | POA: Diagnosis not present

## 2015-04-21 DIAGNOSIS — E785 Hyperlipidemia, unspecified: Secondary | ICD-10-CM

## 2015-04-21 DIAGNOSIS — I5032 Chronic diastolic (congestive) heart failure: Secondary | ICD-10-CM | POA: Diagnosis not present

## 2015-04-21 MED ORDER — CLONIDINE HCL 0.3 MG PO TABS: 0.3000 mg | ORAL_TABLET | Freq: Two times a day (BID) | ORAL | Status: DC

## 2015-04-21 NOTE — Patient Instructions (Signed)
Medication Instructions:  INCREASE CLONIDINE TO 0.3 MG 1 TABLET THREE TIMES DAILY  Labwork: FASTING LIPID AND LIVER PANEL, BMET TO BE DONE IN 1 -2 WEEKS  Testing/Procedures: NONE  Follow-Up: DR. [name] 09/2015  Any Other Special Instructions Will Be Listed Below (If Applicable). NEED TO SCHEDULE SPLIT NIGHT SLEEP STUDY   If you need a refill on your cardiac medications before your next appointment, please call your pharmacy.

## 2015-04-24 DIAGNOSIS — G4733 Obstructive sleep apnea (adult) (pediatric): Secondary | ICD-10-CM | POA: Diagnosis not present

## 2015-04-26 ENCOUNTER — Other Ambulatory Visit (INDEPENDENT_AMBULATORY_CARE_PROVIDER_SITE_OTHER): Payer: Commercial Managed Care - HMO | Admitting: *Deleted

## 2015-04-26 DIAGNOSIS — I2510 Atherosclerotic heart disease of native coronary artery without angina pectoris: Secondary | ICD-10-CM

## 2015-05-03 ENCOUNTER — Other Ambulatory Visit (INDEPENDENT_AMBULATORY_CARE_PROVIDER_SITE_OTHER): Payer: Commercial Managed Care - HMO | Admitting: *Deleted

## 2015-05-03 DIAGNOSIS — I2510 Atherosclerotic heart disease of native coronary artery without angina pectoris: Secondary | ICD-10-CM | POA: Diagnosis not present

## 2015-05-03 DIAGNOSIS — E785 Hyperlipidemia, unspecified: Secondary | ICD-10-CM | POA: Diagnosis not present

## 2015-05-03 LAB — HEPATIC FUNCTION PANEL
ALBUMIN: 4.1 g/dL (ref 3.6–5.1)
ALT: 23 U/L (ref 9–46)
AST: 18 U/L (ref 10–35)
Alkaline Phosphatase: 73 U/L (ref 40–115)
BILIRUBIN DIRECT: 0.1 mg/dL (ref <=0.2)
Indirect Bilirubin: 0.3 mg/dL (ref 0.2–1.2)
TOTAL PROTEIN: 7.4 g/dL (ref 6.1–8.1)
Total Bilirubin: 0.4 mg/dL (ref 0.2–1.2)

## 2015-05-03 LAB — LIPID PANEL
CHOL/HDL RATIO: 2.6 ratio (ref <=5.0)
CHOLESTEROL: 121 mg/dL — AB (ref 125–200)
HDL: 46 mg/dL (ref >=40)
LDL Cholesterol: 64 mg/dL (ref <130)
Triglycerides: 57 mg/dL (ref <150)
VLDL: 11 mg/dL (ref <30)

## 2015-05-03 NOTE — Addendum Note (Signed)
Addended by: Eulis Foster on: 05/03/2015 09:23 AM   Modules accepted: Orders

## 2015-05-03 NOTE — Addendum Note (Signed)
Addended by: Eulis Foster on: 05/03/2015 09:24 AM   Modules accepted: Orders

## 2015-05-04 ENCOUNTER — Telehealth: Payer: Self-pay

## 2015-05-04 NOTE — Telephone Encounter (Signed)
-----   Message from Liliane Shi, [place] sent at 05/03/2015  5:57 PM EST ----- Lipids at goal LFTs ok Continue with current treatment plan. [name], PA-C   05/03/2015 5:57 PM

## 2015-05-04 NOTE — Telephone Encounter (Signed)
spoke with pt about recent lab results and pt verbalized understanding. pt agreed to continue on current treatment plan.

## 2015-05-11 ENCOUNTER — Telehealth: Payer: Self-pay | Admitting: Family Medicine

## 2015-05-11 NOTE — Telephone Encounter (Signed)
Patient is requesting to get a different medication for pain while waiting on MRI test.

## 2015-05-12 ENCOUNTER — Other Ambulatory Visit: Payer: Self-pay

## 2015-05-12 DIAGNOSIS — M5416 Radiculopathy, lumbar region: Secondary | ICD-10-CM

## 2015-05-12 MED ORDER — CYCLOBENZAPRINE HCL 10 MG PO TABS: 10.0000 mg | ORAL_TABLET | Freq: Three times a day (TID) | ORAL | Status: DC | PRN

## 2015-05-12 NOTE — Telephone Encounter (Signed)
If he wants to try a a muscle relaxer that would be fine.  Flexeril 10 mg tid PRN #30 no refills.

## 2015-05-13 NOTE — Telephone Encounter (Signed)
rx sent into pharmacy. Pt made aware.  

## 2015-05-14 ENCOUNTER — Ambulatory Visit
Admission: RE | Admit: 2015-05-14 | Discharge: 2015-05-14 | Disposition: A | Discharge status: Home / Self Care | Payer: Commercial Managed Care - HMO | Source: Ambulatory Visit | Attending: Internal Medicine | Admitting: Internal Medicine

## 2015-05-14 DIAGNOSIS — M5416 Radiculopathy, lumbar region: Secondary | ICD-10-CM

## 2015-05-14 DIAGNOSIS — M4806 Spinal stenosis, lumbar region: Secondary | ICD-10-CM | POA: Diagnosis not present

## 2015-05-16 ENCOUNTER — Encounter: Payer: Self-pay | Admitting: Internal Medicine

## 2015-05-17 ENCOUNTER — Ambulatory Visit (INDEPENDENT_AMBULATORY_CARE_PROVIDER_SITE_OTHER): Payer: Commercial Managed Care - HMO | Admitting: Family Medicine

## 2015-05-17 ENCOUNTER — Encounter: Payer: Self-pay | Admitting: Family Medicine

## 2015-05-17 VITALS — BP 148/94 | HR 80 | Ht 73.0 in | Wt 336.0 lb

## 2015-05-17 DIAGNOSIS — M5416 Radiculopathy, lumbar region: Secondary | ICD-10-CM

## 2015-05-17 MED ORDER — GABAPENTIN 100 MG PO CAPS: 100.0000 mg | ORAL_CAPSULE | Freq: Two times a day (BID) | ORAL | Status: DC

## 2015-05-17 MED ORDER — KETOROLAC TROMETHAMINE 60 MG/2ML IM SOLN
60.0000 mg | Freq: Once | INTRAMUSCULAR | Status: AC
Start: 2015-05-17 — End: 2015-05-17
  Administered 2015-05-17: 60 mg via INTRAMUSCULAR

## 2015-05-17 NOTE — Patient Instructions (Signed)
Good to see you You have spinal stenosis of your back and we will try an epidural injections Gabapentin 100mg  in AM and 100mg  in PM and the 300mg  at night Tylenol 325mg  3 times daily and take with the oxycodone to help See me again in 2 weeks after epidural and we will discuss what is next Happy holidays!

## 2015-05-17 NOTE — Progress Notes (Signed)
[name] [name] [name], [name] [name] [address] Phone: [phone] Subjective:    I'm seeing this patient by the request  of:  Cathlean Cower, MD   CC:  Low back pain with radiation down left leg [id number] [name] [name] is a 59 y.o. male coming in with complaint of  Low back pain. He is had this pain for quite some time and seems to be worsening. It is having pain that seems to be radiating down the posterior aspect of his left leg. Sometimes has weakness with it. Patient continued to worsen. Had failed all other conservative therapy. Was unable to do the home exercises regularly. Has started having difficulty even ambulating secondary to the pain. Patient was sent for advance imaging. Patient did have an MRI which was reviewed by me. Patient does have severe spinal stenosis at L4-L5 as well as some mild foraminal narrowing at multiple levels. Patient does have a right-sided L3 nerve root impingement but no radicular symptoms. Patient denies any bowel or bladder trouble. States that the pain is not being controlled with this scheduled oxycodone that is prescribed by his primary care physician. Becoming less and less active secondary to the pain.     Past Medical History  Diagnosis Date  . Dyslipidemia   . HTN (hypertension)   . CAD (coronary artery disease)     a. s/p PCI/BMS pRCA 2002; b. PCI pRCA & DES p/m RCA 2006; c. PCI/DES OM4 2007; d. PCI/CBA to RCA for ISR 10/2006; e.08/2012 STEMI/Cath/PCI: LM nl, LAD95-32m (3.0x20 Promus Prem DES), 95/95 ap LAD (1.0-1.67mm), LCX 30p 95d (sm), LPL1 70-80p, patent stent, LPL2 nl, RCA 30p, 40/70 ISR, EF 35-40%.  . Ischemic cardiomyopathy     a. EF 40%; improved to normal;  b. 08/2012 Echo: EF 50-55%, mod LVH.  [name] GERD (gastroesophageal reflux disease)   . Tobacco abuse   . Obesity   . OSA (obstructive sleep apnea)     Does not use CPAP as of 05/2011  . Depression   . History of pneumonia   . Diabetes mellitus ([place])      a. A1c 8.8 08/2012->Metformin initiated. => b. A1c (9/14): 6.6  . Hyperlipidemia   . Myocardial infarction ([place])   . Benign essential HTN 03/09/2015   Past Surgical History  Procedure Laterality Date  . Coronary stent placement  2009  . Coronary angioplasty with stent placement    . Left heart catheterization with coronary angiogram N/A 08/31/2012    Procedure: LEFT HEART CATHETERIZATION WITH CORONARY ANGIOGRAM;  Surgeon: Burnell Blanks, MD;  Location: [facility] CATH LAB;  Service: Cardiovascular;  Laterality: N/A;  . Percutaneous coronary stent intervention (pci-s) Right 08/31/2012    Procedure: PERCUTANEOUS CORONARY STENT INTERVENTION (PCI-S);  Surgeon: Burnell Blanks, MD;  Location: [facility] CATH LAB;  Service: Cardiovascular;  Laterality: Right;  . Left heart catheterization with coronary angiogram N/A 11/16/2013    Procedure: LEFT HEART CATHETERIZATION WITH CORONARY ANGIOGRAM;  Surgeon: Blane Ohara, MD;  Location: [facility] CATH LAB;  Service: Cardiovascular;  Laterality: N/A;  . Percutaneous coronary stent intervention (pci-s)  11/16/2013    Procedure: PERCUTANEOUS CORONARY STENT INTERVENTION (PCI-S);  Surgeon: Blane Ohara, MD;  Location: [facility] CATH LAB;  Service: Cardiovascular;;  . Left heart catheterization with coronary angiogram N/A 03/15/2014    Procedure: LEFT HEART CATHETERIZATION WITH CORONARY ANGIOGRAM;  Surgeon: Peter M Martinique, MD;  Location: [facility] CATH LAB;  Service: Cardiovascular;  Laterality: N/A;   Social History  Substance Use Topics  . Smoking status: Current Every Day Smoker -- 0.50 packs/day for 30 years    Types: Cigarettes  . Smokeless tobacco: None     Comment: trying to cut down  . Alcohol Use: No   Allergies  Allergen Reactions  . Penicillins Other (See Comments)    Unknown childhood reaction   Family History  Problem Relation Age of Onset  . Coronary artery disease    . Depression Mother   . Cancer Mother     ovarian  . Hypertension Mother     Died,  69  . Other Father     motor vehicle accident  . Heart attack Neg Hx   . Stroke Neg Hx    Allergies  Allergen Reactions  . Penicillins Other (See Comments)    Unknown childhood reaction     Past medical history, social, surgical and family history all reviewed in electronic medical record.   Review of Systems: No headache, visual changes, nausea, vomiting, diarrhea, constipation, dizziness, abdominal pain, skin rash, fevers, chills, night sweats, weight loss, swollen lymph nodes, body aches, joint swelling, muscle aches, chest pain, shortness of breath, mood changes.   Objective Blood pressure 148/94, pulse 80, height 6\' 1"  (1.854 m), weight 336 lb (152.409 kg), SpO2 96 %.  General: No apparent distress alert and oriented x3 mood and affect normal, dressed appropriately.  HEENT: Pupils equal, extraocular movements intact  Respiratory: Patient's speak in full sentences and does not appear short of breath  Cardiovascular: No lower extremity edema, non tender, no erythema  Skin: Warm dry intact with no signs of infection or rash on extremities or on axial skeleton.  Abdomen: Soft nontender  Neuro: Cranial nerves II through XII are intact, neurovascularly intact in all extremities with 2+ DTRs and 2+ pulses.  Lymph: No lymphadenopathy of posterior or anterior cervical chain or axillae bilaterally.  Gait  Antalgic gait MSK:  Non tender with full range of motion and good stability and symmetric strength and tone of shoulders, elbows, wrist, hip, knee and ankles bilaterally.  Arthritic changes of multiple joints Back Exam:  Inspection: Unremarkable  Motion: Flexion 25 deg, Extension 10 deg with exacerbation of radiculopathy, Side Bending to 35 deg bilaterally,  Rotation to 35 deg bilaterally  SLR laying:  Positive left side XSLR laying: Negative  Palpable tenderness: discomfort of the musculature of the L4-L5 on the left side FABER: negative. Sensory change: Gross sensation intact to  all lumbar and sacral dermatomes.  Reflexes: 1+ a on left at patellar tendon compared to 2+ on the contralateral side, 1+ at achilles tendons, Babinski's downgoing.  Strength at foot  An leg    shows 4 out of 5 strength of the quadriceps as well as plantarflexion on the left side compared to full strength on the contralateral side. Seems to be the same at this time.        Impression and Recommendations:     This case required medical decision making of moderate complexity.

## 2015-05-17 NOTE — Assessment & Plan Note (Signed)
Believe it is significantly to patient's spinal stenosis and was seen on MRI. We discussed different treatment options. Because the patient's other comorbidities I do think that has most likely way to respond will be an epidural steroid injection. Patient will be scheduled for this. Encourage patient to start doing some mild increase in activity. We did increase patient's gabapentin to 100 mg in a.m. and p.m. in continuing 300 mg at night. Discussed with safe medication over-the-counter would be Tylenol for him. Patient will get this low dose. Can take it with some oxycodone necessary. Patient will continue with icing regimen. I like to see him 2 weeks after the epidural to discuss further.  Spent  25 minutes with patient face-to-face and had greater than 50% of counseling including as described above in assessment and plan.

## 2015-05-17 NOTE — Progress Notes (Signed)
Pre visit review using our clinic review tool, if applicable. No additional management support is needed unless otherwise documented below in the visit note. 

## 2015-05-24 ENCOUNTER — Telehealth: Payer: Self-pay | Admitting: Family Medicine

## 2015-05-24 ENCOUNTER — Other Ambulatory Visit: Payer: Self-pay | Admitting: Internal Medicine

## 2015-05-24 DIAGNOSIS — G4733 Obstructive sleep apnea (adult) (pediatric): Secondary | ICD-10-CM | POA: Diagnosis not present

## 2015-05-24 MED ORDER — TIZANIDINE HCL 4 MG PO CAPS: 4.0000 mg | ORAL_CAPSULE | Freq: Three times a day (TID) | ORAL | Status: DC

## 2015-05-24 NOTE — Telephone Encounter (Signed)
Would need to stop flexeril OK with zanaflex 4mg  up to 3 times daily #30 if he wants.  Will not give any pain meds.  If worsening symtoms need to go to ER.

## 2015-05-24 NOTE — Telephone Encounter (Signed)
Refill done.  

## 2015-05-24 NOTE — Telephone Encounter (Signed)
Pt states cyclobenzaprine (FLEXERIL) 10 MG tablet [id number]  Is not working for him and is wondering if you would want to prescribe something else

## 2015-05-25 ENCOUNTER — Telehealth: Payer: Self-pay | Admitting: Internal Medicine

## 2015-05-25 MED ORDER — OXYCODONE HCL 5 MG PO TABS: ORAL_TABLET | ORAL | Status: DC

## 2015-05-25 NOTE — Telephone Encounter (Signed)
Please advise in PCP's absence, thanks! 

## 2015-05-25 NOTE — Telephone Encounter (Signed)
Pt informed, Rx in cabinet for pt pick up  

## 2015-05-25 NOTE — Telephone Encounter (Signed)
Pt requesting refill for oxyCODONE (OXY IR/ROXICODONE) 5 MG immediate release tablet [id number]

## 2015-05-25 NOTE — Telephone Encounter (Signed)
done

## 2015-05-27 ENCOUNTER — Telehealth: Payer: Self-pay | Admitting: Cardiovascular Disease

## 2015-05-27 NOTE — Telephone Encounter (Signed)
New message     Calling to check status on surgical clearance that was faxed to stop plavix for steroid injection.  Please call

## 2015-05-27 NOTE — Telephone Encounter (Signed)
I spoke with Chrys Racer and made her aware that I have not received a fax in regards to this patient.  She will refax request for injection.

## 2015-05-30 NOTE — Telephone Encounter (Signed)
Fax received from [facility]:  [name] [name] has been referred for an epidural lumbar steroid injection. [facility] is requesting permission for him to stop Plavix for 5 days.  The pt's appointment will be scheduled, following our response.

## 2015-05-31 NOTE — Telephone Encounter (Signed)
Ok to hold plavix x 5 days for injection. Resume plavix when safe from bleeding risk perspective. thx

## 2015-05-31 NOTE — Telephone Encounter (Signed)
Telephone encounter faxed to [phone].

## 2015-06-06 ENCOUNTER — Encounter (HOSPITAL_COMMUNITY): Payer: Self-pay | Admitting: Emergency Medicine

## 2015-06-06 ENCOUNTER — Emergency Department (HOSPITAL_COMMUNITY): Payer: Commercial Managed Care - HMO

## 2015-06-06 ENCOUNTER — Inpatient Hospital Stay (HOSPITAL_COMMUNITY)
Admission: EM | Admit: 2015-06-06 | Discharge: 2015-06-09 | DRG: 305 | Disposition: A | Discharge status: Home / Self Care | Payer: Commercial Managed Care - HMO | Attending: Cardiovascular Disease | Admitting: Cardiovascular Disease

## 2015-06-06 DIAGNOSIS — I161 Hypertensive emergency: Principal | ICD-10-CM | POA: Diagnosis present

## 2015-06-06 DIAGNOSIS — K219 Gastro-esophageal reflux disease without esophagitis: Secondary | ICD-10-CM | POA: Diagnosis present

## 2015-06-06 DIAGNOSIS — I160 Hypertensive urgency: Secondary | ICD-10-CM | POA: Diagnosis not present

## 2015-06-06 DIAGNOSIS — G8929 Other chronic pain: Secondary | ICD-10-CM | POA: Diagnosis present

## 2015-06-06 DIAGNOSIS — I10 Essential (primary) hypertension: Secondary | ICD-10-CM | POA: Diagnosis present

## 2015-06-06 DIAGNOSIS — F329 Major depressive disorder, single episode, unspecified: Secondary | ICD-10-CM | POA: Diagnosis present

## 2015-06-06 DIAGNOSIS — Z6841 Body Mass Index (BMI) 40.0 and over, adult: Secondary | ICD-10-CM | POA: Diagnosis not present

## 2015-06-06 DIAGNOSIS — Z720 Tobacco use: Secondary | ICD-10-CM | POA: Diagnosis present

## 2015-06-06 DIAGNOSIS — Z7982 Long term (current) use of aspirin: Secondary | ICD-10-CM

## 2015-06-06 DIAGNOSIS — I214 Non-ST elevation (NSTEMI) myocardial infarction: Secondary | ICD-10-CM

## 2015-06-06 DIAGNOSIS — F17210 Nicotine dependence, cigarettes, uncomplicated: Secondary | ICD-10-CM | POA: Diagnosis not present

## 2015-06-06 DIAGNOSIS — Z79899 Other long term (current) drug therapy: Secondary | ICD-10-CM | POA: Diagnosis not present

## 2015-06-06 DIAGNOSIS — G4733 Obstructive sleep apnea (adult) (pediatric): Secondary | ICD-10-CM | POA: Diagnosis not present

## 2015-06-06 DIAGNOSIS — Z9861 Coronary angioplasty status: Secondary | ICD-10-CM

## 2015-06-06 DIAGNOSIS — Z955 Presence of coronary angioplasty implant and graft: Secondary | ICD-10-CM

## 2015-06-06 DIAGNOSIS — I169 Hypertensive crisis, unspecified: Secondary | ICD-10-CM

## 2015-06-06 DIAGNOSIS — E66813 Obesity, class 3: Secondary | ICD-10-CM | POA: Diagnosis present

## 2015-06-06 DIAGNOSIS — I252 Old myocardial infarction: Secondary | ICD-10-CM | POA: Diagnosis not present

## 2015-06-06 DIAGNOSIS — M5126 Other intervertebral disc displacement, lumbar region: Secondary | ICD-10-CM | POA: Diagnosis present

## 2015-06-06 DIAGNOSIS — Z7984 Long term (current) use of oral hypoglycemic drugs: Secondary | ICD-10-CM | POA: Diagnosis not present

## 2015-06-06 DIAGNOSIS — I5032 Chronic diastolic (congestive) heart failure: Secondary | ICD-10-CM

## 2015-06-06 DIAGNOSIS — E119 Type 2 diabetes mellitus without complications: Secondary | ICD-10-CM | POA: Diagnosis present

## 2015-06-06 DIAGNOSIS — I2510 Atherosclerotic heart disease of native coronary artery without angina pectoris: Secondary | ICD-10-CM | POA: Diagnosis present

## 2015-06-06 DIAGNOSIS — Z8249 Family history of ischemic heart disease and other diseases of the circulatory system: Secondary | ICD-10-CM

## 2015-06-06 DIAGNOSIS — I255 Ischemic cardiomyopathy: Secondary | ICD-10-CM | POA: Diagnosis not present

## 2015-06-06 DIAGNOSIS — I248 Other forms of acute ischemic heart disease: Secondary | ICD-10-CM | POA: Diagnosis not present

## 2015-06-06 DIAGNOSIS — Z87891 Personal history of nicotine dependence: Secondary | ICD-10-CM | POA: Diagnosis present

## 2015-06-06 DIAGNOSIS — I25119 Atherosclerotic heart disease of native coronary artery with unspecified angina pectoris: Secondary | ICD-10-CM | POA: Diagnosis not present

## 2015-06-06 DIAGNOSIS — R079 Chest pain, unspecified: Secondary | ICD-10-CM | POA: Diagnosis not present

## 2015-06-06 DIAGNOSIS — E785 Hyperlipidemia, unspecified: Secondary | ICD-10-CM | POA: Diagnosis not present

## 2015-06-06 DIAGNOSIS — I119 Hypertensive heart disease without heart failure: Secondary | ICD-10-CM | POA: Diagnosis not present

## 2015-06-06 LAB — CBC
HCT: 48.8 % (ref 39.0–52.0)
Hemoglobin: 15.7 g/dL (ref 13.0–17.0)
MCH: 29.1 pg (ref 26.0–34.0)
MCHC: 32.2 g/dL (ref 30.0–36.0)
MCV: 90.5 fL (ref 78.0–100.0)
Platelets: 197 10*3/uL (ref 150–400)
RBC: 5.39 MIL/uL (ref 4.22–5.81)
RDW: 15.6 % — AB (ref 11.5–15.5)
WBC: 10.1 10*3/uL (ref 4.0–10.5)

## 2015-06-06 LAB — PROTIME-INR
INR: 0.88 (ref 0.00–1.49)
PROTHROMBIN TIME: 12.2 s (ref 11.6–15.2)

## 2015-06-06 LAB — COMPREHENSIVE METABOLIC PANEL
ALBUMIN: 3.8 g/dL (ref 3.5–5.0)
ALK PHOS: 77 U/L (ref 38–126)
ALT: 29 U/L (ref 17–63)
ANION GAP: 11 (ref 5–15)
AST: 28 U/L (ref 15–41)
BILIRUBIN TOTAL: 0.5 mg/dL (ref 0.3–1.2)
BUN: 11 mg/dL (ref 6–20)
CALCIUM: 9.9 mg/dL (ref 8.9–10.3)
CO2: 25 mmol/L (ref 22–32)
Chloride: 106 mmol/L (ref 101–111)
Creatinine, Ser: 1.11 mg/dL (ref 0.61–1.24)
GFR calc Af Amer: >60 mL/min (ref >60)
GLUCOSE: 228 mg/dL — AB (ref 65–99)
POTASSIUM: 3.7 mmol/L (ref 3.5–5.1)
Sodium: 142 mmol/L (ref 135–145)
TOTAL PROTEIN: 6.5 g/dL (ref 6.5–8.1)

## 2015-06-06 LAB — GLUCOSE, CAPILLARY: Glucose-Capillary: 150 mg/dL — ABNORMAL HIGH (ref 65–99)

## 2015-06-06 LAB — APTT: aPTT: 26 seconds (ref 24–37)

## 2015-06-06 LAB — TROPONIN I
TROPONIN I: 0.36 ng/mL — AB (ref <0.031)
Troponin I: 0.22 ng/mL — ABNORMAL HIGH (ref <0.031)

## 2015-06-06 MED ORDER — HEPARIN SODIUM (PORCINE) 5000 UNIT/ML IJ SOLN
4000.0000 [IU] | Freq: Once | INTRAMUSCULAR | Status: AC
  Administered 2015-06-06: 4000 [IU] via INTRAVENOUS
  Filled 2015-06-06: qty 1

## 2015-06-06 MED ORDER — NITROGLYCERIN 0.4 MG SL SUBL: 0.4000 mg | SUBLINGUAL_TABLET | SUBLINGUAL | Status: DC | PRN

## 2015-06-06 MED ORDER — FAMOTIDINE 20 MG PO TABS
40.0000 mg | ORAL_TABLET | Freq: Two times a day (BID) | ORAL | Status: DC
  Administered 2015-06-06 – 2015-06-09 (×6): 40 mg via ORAL
  Filled 2015-06-06 (×6): qty 2

## 2015-06-06 MED ORDER — ASPIRIN EC 81 MG PO TBEC
81.0000 mg | DELAYED_RELEASE_TABLET | Freq: Every day | ORAL | Status: DC
  Administered 2015-06-07 – 2015-06-09 (×3): 81 mg via ORAL
  Filled 2015-06-06 (×3): qty 1

## 2015-06-06 MED ORDER — TIZANIDINE HCL 2 MG PO TABS
4.0000 mg | ORAL_TABLET | Freq: Three times a day (TID) | ORAL | Status: DC
  Administered 2015-06-06 – 2015-06-09 (×8): 4 mg via ORAL
  Filled 2015-06-06 (×10): qty 2

## 2015-06-06 MED ORDER — GABAPENTIN 300 MG PO CAPS
300.0000 mg | ORAL_CAPSULE | Freq: Every day | ORAL | Status: DC
  Administered 2015-06-06 – 2015-06-08 (×3): 300 mg via ORAL
  Filled 2015-06-06 (×3): qty 1

## 2015-06-06 MED ORDER — ASPIRIN 81 MG PO CHEW
324.0000 mg | CHEWABLE_TABLET | Freq: Once | ORAL | Status: DC
Start: 2015-06-06 — End: 2015-06-09

## 2015-06-06 MED ORDER — AMLODIPINE BESYLATE 10 MG PO TABS
10.0000 mg | ORAL_TABLET | Freq: Every day | ORAL | Status: DC
  Administered 2015-06-07 – 2015-06-09 (×3): 10 mg via ORAL
  Filled 2015-06-06 (×3): qty 1

## 2015-06-06 MED ORDER — OXYCODONE HCL 5 MG PO TABS
5.0000 mg | ORAL_TABLET | Freq: Four times a day (QID) | ORAL | Status: DC | PRN
Start: 2015-06-06 — End: 2015-06-06
  Administered 2015-06-06: 5 mg via ORAL
  Filled 2015-06-06: qty 1

## 2015-06-06 MED ORDER — CLOPIDOGREL BISULFATE 75 MG PO TABS
75.0000 mg | ORAL_TABLET | Freq: Every day | ORAL | Status: DC
  Administered 2015-06-07 – 2015-06-09 (×3): 75 mg via ORAL
  Filled 2015-06-06 (×3): qty 1

## 2015-06-06 MED ORDER — CLONIDINE HCL 0.3 MG PO TABS
0.3000 mg | ORAL_TABLET | Freq: Two times a day (BID) | ORAL | Status: DC
  Administered 2015-06-06 – 2015-06-09 (×6): 0.3 mg via ORAL
  Filled 2015-06-06 (×6): qty 1

## 2015-06-06 MED ORDER — HEPARIN BOLUS VIA INFUSION: 4000.0000 [IU] | Freq: Once | INTRAVENOUS | Status: DC

## 2015-06-06 MED ORDER — OXYCODONE HCL 5 MG PO TABS
5.0000 mg | ORAL_TABLET | Freq: Four times a day (QID) | ORAL | Status: DC | PRN
  Administered 2015-06-06 – 2015-06-08 (×7): 10 mg via ORAL
  Filled 2015-06-06 (×7): qty 2

## 2015-06-06 MED ORDER — NITROGLYCERIN IN D5W 200-5 MCG/ML-% IV SOLN
5.0000 ug/min | Freq: Once | INTRAVENOUS | Status: AC
  Administered 2015-06-06: 40 ug/min via INTRAVENOUS

## 2015-06-06 MED ORDER — METFORMIN HCL ER 500 MG PO TB24
1000.0000 mg | ORAL_TABLET | Freq: Every day | ORAL | Status: DC
  Administered 2015-06-07 – 2015-06-09 (×3): 1000 mg via ORAL
  Filled 2015-06-06 (×4): qty 2

## 2015-06-06 MED ORDER — ACETAMINOPHEN 500 MG PO TABS
1000.0000 mg | ORAL_TABLET | Freq: Four times a day (QID) | ORAL | Status: DC | PRN
  Administered 2015-06-06: 1000 mg via ORAL
  Filled 2015-06-06: qty 2

## 2015-06-06 MED ORDER — CLOPIDOGREL BISULFATE 75 MG PO TABS
300.0000 mg | ORAL_TABLET | Freq: Once | ORAL | Status: AC
  Administered 2015-06-06: 300 mg via ORAL
  Filled 2015-06-06: qty 4

## 2015-06-06 MED ORDER — FUROSEMIDE 80 MG PO TABS
80.0000 mg | ORAL_TABLET | Freq: Two times a day (BID) | ORAL | Status: DC
  Administered 2015-06-06 – 2015-06-07 (×2): 80 mg via ORAL
  Filled 2015-06-06 (×2): qty 1

## 2015-06-06 MED ORDER — ESCITALOPRAM OXALATE 10 MG PO TABS
10.0000 mg | ORAL_TABLET | Freq: Every day | ORAL | Status: DC
  Administered 2015-06-06 – 2015-06-09 (×4): 10 mg via ORAL
  Filled 2015-06-06 (×4): qty 1

## 2015-06-06 MED ORDER — NITROGLYCERIN IN D5W 200-5 MCG/ML-% IV SOLN
5.0000 ug/min | Freq: Once | INTRAVENOUS | Status: AC
  Administered 2015-06-06: 30 ug/min via INTRAVENOUS
  Filled 2015-06-06: qty 250

## 2015-06-06 MED ORDER — LOSARTAN POTASSIUM 50 MG PO TABS
100.0000 mg | ORAL_TABLET | Freq: Every day | ORAL | Status: DC
  Administered 2015-06-07 – 2015-06-09 (×3): 100 mg via ORAL
  Filled 2015-06-06 (×3): qty 2

## 2015-06-06 MED ORDER — ROSUVASTATIN CALCIUM 10 MG PO TABS
40.0000 mg | ORAL_TABLET | Freq: Every day | ORAL | Status: DC
  Administered 2015-06-07 – 2015-06-08 (×2): 40 mg via ORAL
  Filled 2015-06-06 (×2): qty 4

## 2015-06-06 MED ORDER — CYCLOBENZAPRINE HCL 10 MG PO TABS
10.0000 mg | ORAL_TABLET | Freq: Three times a day (TID) | ORAL | Status: DC | PRN
  Administered 2015-06-06 – 2015-06-07 (×2): 10 mg via ORAL
  Filled 2015-06-06 (×2): qty 1

## 2015-06-06 MED ORDER — POTASSIUM CHLORIDE CRYS ER 20 MEQ PO TBCR
40.0000 meq | EXTENDED_RELEASE_TABLET | Freq: Two times a day (BID) | ORAL | Status: DC
  Administered 2015-06-06 – 2015-06-08 (×5): 40 meq via ORAL
  Filled 2015-06-06 (×5): qty 2

## 2015-06-06 MED ORDER — ISOSORBIDE MONONITRATE ER 60 MG PO TB24
90.0000 mg | ORAL_TABLET | Freq: Every day | ORAL | Status: DC
  Administered 2015-06-07 – 2015-06-09 (×3): 90 mg via ORAL
  Filled 2015-06-06 (×3): qty 1

## 2015-06-06 MED ORDER — CARVEDILOL 3.125 MG PO TABS
3.1250 mg | ORAL_TABLET | Freq: Two times a day (BID) | ORAL | Status: DC
  Administered 2015-06-06 – 2015-06-07 (×2): 3.125 mg via ORAL
  Filled 2015-06-06 (×2): qty 1

## 2015-06-06 NOTE — ED Notes (Signed)
Pt has been off his plavix for approx 2 days for an epidural for low back pain

## 2015-06-06 NOTE — ED Notes (Signed)
Patient alert and oriented. No c/o CP only back pain which is a chronic condition

## 2015-06-06 NOTE — Consult Note (Addendum)
Patient ID: [name] [name] MRN: [id number], DOB/AGE: Jun 03, 1956   Admit date: 06/06/2015   Primary Physician: Cathlean Cower, MD Primary Cardiologist: Dr. [name]  Pt. Profile:  59 y/o male with long h/o CAD, s/p multiple PCIs, presenting with chest pain  + abnormal troponin in the setting of hypertensive emergency with SBP > 200.   Problem List  Past Medical History  Diagnosis Date  . Dyslipidemia   . HTN (hypertension)   . CAD (coronary artery disease)     a. s/p PCI/BMS pRCA 2002; b. PCI pRCA & DES p/m RCA 2006; c. PCI/DES OM4 2007; d. PCI/CBA to RCA for ISR 10/2006; e.08/2012 STEMI/Cath/PCI: LM nl, LAD95-85m (3.0x20 Promus Prem DES), 95/95 ap LAD (1.0-1.73mm), LCX 30p 95d (sm), LPL1 70-80p, patent stent, LPL2 nl, RCA 30p, 40/70 ISR, EF 35-40%.  . Ischemic cardiomyopathy     a. EF 40%; improved to normal;  b. 08/2012 Echo: EF 50-55%, mod LVH.  [name] GERD (gastroesophageal reflux disease)   . Tobacco abuse   . Obesity   . OSA (obstructive sleep apnea)     Does not use CPAP as of 05/2011  . Depression   . History of pneumonia   . Diabetes mellitus ([place])     a. A1c 8.8 08/2012->Metformin initiated. => b. A1c (9/14): 6.6  . Hyperlipidemia   . Myocardial infarction ([place])   . Benign essential HTN 03/09/2015    Past Surgical History  Procedure Laterality Date  . Coronary stent placement  2009  . Coronary angioplasty with stent placement    . Left heart catheterization with coronary angiogram N/A 08/31/2012    Procedure: LEFT HEART CATHETERIZATION WITH CORONARY ANGIOGRAM;  Surgeon: Burnell Blanks, MD;  Location: [facility] CATH LAB;  Service: Cardiovascular;  Laterality: N/A;  . Percutaneous coronary stent intervention (pci-s) Right 08/31/2012    Procedure: PERCUTANEOUS CORONARY STENT INTERVENTION (PCI-S);  Surgeon: Burnell Blanks, MD;  Location: [facility] CATH LAB;  Service: Cardiovascular;  Laterality: Right;  . Left heart catheterization with coronary angiogram N/A 11/16/2013   Procedure: LEFT HEART CATHETERIZATION WITH CORONARY ANGIOGRAM;  Surgeon: Blane Ohara, MD;  Location: [facility] CATH LAB;  Service: Cardiovascular;  Laterality: N/A;  . Percutaneous coronary stent intervention (pci-s)  11/16/2013    Procedure: PERCUTANEOUS CORONARY STENT INTERVENTION (PCI-S);  Surgeon: Blane Ohara, MD;  Location: [facility] CATH LAB;  Service: Cardiovascular;;  . Left heart catheterization with coronary angiogram N/A 03/15/2014    Procedure: LEFT HEART CATHETERIZATION WITH CORONARY ANGIOGRAM;  Surgeon: Peter M Martinique, MD;  Location: [facility] CATH LAB;  Service: Cardiovascular;  Laterality: N/A;     Allergies  Allergies  Allergen Reactions  . Penicillins Other (See Comments)    Unknown childhood reaction    HPI  59 y/o male followed by Dr. [name], presenting to the [facility] ED with a complaint of chest pain and + troponin of 0.22, in the setting of hypertensive emergency with SBP > 200.   He has a PMH significant for CAD, s/p PCI to the RCA and PL1, cutting balloon angioplasty secondary to in-stent restenosis of the RCA in 5/08, ischemic cardiomyopathy with previous EF 40% (improved to normal in the past), diastolic CHF, HTN, HL, OSA.   Admitted 08/2012 with an anterior STEMI treated with a Promus DES to the LAD. He had diffuse residual disease and medical therapy was recommended. EF was 35-40%. Follow up demonstrated improved LVF with EF 50-55%.   Admitted 11/2013 with NSTEMI. Severe stenosis OM1 treated with PCI (2.75x15 mm  Xience Alpine DES).  Admitted 03/2014 with NSTEMI. LHC demonstrated severe disease in the small distal LCx and OM2, moderate disease in the mid RCA and distal LAD. This was not changed significantly since his prior study and medical therapy was continued. He had BB reduced 2/2 bradycardia.   Last seen by Dr. [name] 4/16. Admitted 9/16 with chest pain. D-dimer was negative. Troponin levels remained normal. Echocardiogram demonstrated normal LV function. He was followed  by cardiology and it was recommended that he undergo OP stress testing but never completed.   He notes the development of sudden onset of severe substernal chest pressure with associated dyspnea, very similar to his previous angina. Symptoms occurred at rest while at home. He took a sublingual nitroglycerin at home which relieved his discomfort. He later had recurrent chest discomfort prompting him to call EMS. He was transported to the [facility] ED and given 4 baby aspirin. He was noted to be severely hypertensive with systolic blood pressures greater than 200. Subsequently he was placed on IV nitroglycerin. His blood pressure is improving and he denies any recurrent chest discomfort while in the ED. He does report that he recently discontinued his Plavix 3 days ago in anticipation for spinal injections for chronic low back pain, that is scheduled for this coming Wednesday.   Troponin I level in the ED is 0.22. EKG shows sinus rhythm with chronic right bundle branch block.   Home Medications  Prior to Admission medications   Medication Sig Start Date End Date Taking? Authorizing Provider  amLODipine (NORVASC) 10 MG tablet TAKE 1 TABLET BY MOUTH DAILY 03/22/15  Yes Biagio Borg, MD  aspirin 81 MG tablet Take 81 mg by mouth daily.   Yes Historical Provider, MD  carvedilol (COREG) 3.125 MG tablet Take 1 tablet (3.125 mg total) by mouth 2 (two) times daily with a meal. 10/08/14  Yes Sherren Mocha, MD  cloNIDine (CATAPRES) 0.3 MG tablet Take 1 tablet (0.3 mg total) by mouth 2 (two) times daily. 04/21/15  Yes Scott T [name], PA-C  cyclobenzaprine (FLEXERIL) 10 MG tablet Take 1 tablet (10 mg total) by mouth 3 (three) times daily as needed for muscle spasms. 05/12/15  Yes Lyndal Pulley, DO  escitalopram (LEXAPRO) 10 MG tablet TAKE 1 TABLET BY MOUTH ONCE DAILY 10/01/14  Yes Biagio Borg, MD  famotidine (PEPCID) 40 MG tablet TAKE 1 TABLET BY MOUTH EVERY 12 HOURS 05/25/15  Yes Biagio Borg, MD  furosemide  (LASIX) 80 MG tablet Take 80 mg by mouth 2 (two) times daily. 01/28/15  Yes Historical Provider, MD  gabapentin (NEURONTIN) 300 MG capsule TAKE 1 CAPSULE(300 MG) BY MOUTH AT BEDTIME 04/18/15  Yes Lyndal Pulley, DO  isosorbide mononitrate (IMDUR) 30 MG 24 hr tablet Take 3 tablets (90 mg total) by mouth daily. 08/09/14  Yes Scott T [name], PA-C  losartan (COZAAR) 100 MG tablet Take 1 tablet (100 mg total) by mouth daily. 12/10/14  Yes Sherren Mocha, MD  metFORMIN (GLUCOPHAGE-XR) 500 MG 24 hr tablet TAKE 2 TABLETS BY MOUTH EVERY MORNING 03/21/15  Yes Biagio Borg, MD  Multiple Vitamins-Minerals (MULTIVITAMIN WITH MINERALS) tablet Take 1 tablet by mouth daily. CENTRUM SILVER MENS  50 PLUS   Yes Historical Provider, MD  nitroGLYCERIN (NITROSTAT) 0.4 MG SL tablet Place 1 tablet (0.4 mg total) under the tongue every 5 (five) minutes as needed for chest pain. For chest pain 07/29/14  Yes Liliane Shi, PA-C  oxyCODONE (OXY IR/ROXICODONE) 5 MG immediate  release tablet 1-2 every 6 hrs as needed for pain 05/25/15  Yes Janith Lima, MD  potassium chloride SA (K-DUR,KLOR-CON) 20 MEQ tablet Take 2 tablets (40 mEq total) by mouth 2 (two) times daily. 09/15/14  Yes Sueanne Margarita, MD  rosuvastatin (CRESTOR) 40 MG tablet Take 1 tablet (40 mg total) by mouth daily. 04/13/15  Yes Biagio Borg, MD  tiZANidine (ZANAFLEX) 4 MG capsule Take 1 capsule (4 mg total) by mouth 3 (three) times daily. 05/24/15  Yes Lyndal Pulley, DO  predniSONE (DELTASONE) 10 MG tablet 3tab by mouth x3day,2tab x 3day,1tab x 3 days 04/13/15   Biagio Borg, MD    Family History  Family History  Problem Relation Age of Onset  . Coronary artery disease    . Depression Mother   . Cancer Mother     ovarian  . Hypertension Mother     Died, 15  . Other Father     motor vehicle accident  . Heart attack Neg Hx   . Stroke Neg Hx     Social History  Social History   Social History  . Marital Status: Single    Spouse Name: N/A  . Number of  Children: 0  . Years of Education: N/A   Occupational History  . retired    Social History Main Topics  . Smoking status: Current Every Day Smoker -- 0.50 packs/day for 30 years    Types: Cigarettes  . Smokeless tobacco: Not on file     Comment: trying to cut down  . Alcohol Use: No  . Drug Use: No     Comment: remote marijuana use  . Sexual Activity: Not Currently   Other Topics Concern  . Not on file   Social History Narrative   Lives alone.  He has no children.   He retired early due to health problems.           Review of Systems General:  No chills, fever, night sweats or weight changes.  Cardiovascular:  +chest pain, + dyspnea on exertion, edema, no orthopnea, palpitations, paroxysmal nocturnal dyspnea. Dermatological: No rash, lesions/masses Respiratory: No cough, dyspnea Urologic: No hematuria, dysuria Abdominal:   No nausea, vomiting, diarrhea, bright red blood per rectum, melena, or hematemesis Neurologic:  No visual changes, wkns, changes in mental status. All other systems reviewed and are otherwise negative except as noted above.  Physical Exam  Blood pressure 192/106, pulse 74, temperature 99 F (37.2 C), temperature source Oral, resp. rate 18, height 6\' 1"  (1.854 m), weight 326 lb (147.873 kg), SpO2 96 %.  General: Pleasant, NAD, obese Psych: Normal affect. Neuro: Alert and oriented X 3. Moves all extremities spontaneously. HEENT: Normal  Neck: Supple without bruits or JVD. Lungs:  Resp regular and unlabored, CTA. Heart: RRR no s3, s4, or murmurs. Abdomen: Soft, non-tender, non-distended, BS + x 4. obese Extremities: No clubbing, cyanosis or edema. DP/PT/Radials 2+ and equal bilaterally.  Labs  Troponin (Point of Care Test) No results for input(s): TROPIPOC in the last 72 hours.  Recent Labs  06/06/15 1756  TROPONINI 0.22*   Lab Results  Component Value Date   WBC 10.1 06/06/2015   HGB 15.7 06/06/2015   HCT 48.8 06/06/2015   MCV 90.5  06/06/2015   PLT 197 06/06/2015    Recent Labs Lab 06/06/15 1756  NA 142  K 3.7  CL 106  CO2 25  BUN 11  CREATININE 1.11  CALCIUM 9.9  PROT 6.5  BILITOT 0.5  ALKPHOS 77  ALT 29  AST 28  GLUCOSE 228*   Lab Results  Component Value Date   CHOL 121* 05/03/2015   HDL 46 05/03/2015   LDLCALC 64 05/03/2015   TRIG 57 05/03/2015   Lab Results  Component Value Date   DDIMER 0.43 02/11/2015     Radiology/Studies  Mr Lumbar Spine Wo Contrast  05/14/2015  CLINICAL DATA:  Two month history of low back and left hip and buttock pain. EXAM: MRI LUMBAR SPINE WITHOUT CONTRAST TECHNIQUE: Multiplanar, multisequence MR imaging of the lumbar spine was performed. No intravenous contrast was administered. COMPARISON:  Radiographs 02/14/2008 FINDINGS: Normal alignment of the lumbar vertebral bodies. They demonstrate normal marrow signal. The intervertebral disc spaces are fairly well maintained. The last full intervertebral disc space is labeled L5-S1 and this correlates with the plain films. The conus medullaris terminates at the bottom of L1. No significant paraspinal or retroperitoneal findings. Small renal cysts are noted. Slightly prominent cisterna Chyle. L1-2:  No significant findings.  Mild facet disease. L2-3: Diffuse bulging annulus with mild flattening of the ventral thecal sac and mild left lateral recess stenosis. There is a shallow broad-based lateral foraminal and extra foraminal disc protrusion on the left which appears to contact and likely irritates the left L2 nerve root. Moderate facet disease. L3-4: Broad-based right foraminal and extra foraminal disc protrusion on the right contacting and displacing the right L3 nerve root. There is also a diffuse bulging annulus, short pedicles and facet disease contributing to mild spinal and bilateral lateral recess stenosis. L4-5: Diffuse bulging annulus and central disc protrusion in combination with short pedicles and advanced facet disease  contributing to moderate to moderately severe spinal and bilateral lateral recess stenosis. No significant foraminal stenosis. L5-S1: Advanced facet disease but no disc protrusions, spinal or foraminal stenosis. IMPRESSION: 1. Multilevel disc disease and facet disease. 2. Shallow broad-based lateral foraminal and extra foraminal disc protrusion on the left at L2-3 likely effecting the left L2 nerve root. 3. Broad-based right foraminal and extra foraminal disc protrusion at L3-4 with mass effect on the right L3 nerve root. There is also multifactorial mild spinal and bilateral lateral recess stenosis at this level. 4. Multifactorial moderate to moderately severe spinal and bilateral lateral recess stenosis at L4-5. Electronically Signed   By: Marijo Sanes M.D.   On: 05/14/2015 12:52   Dg Chest Portable 1 View  06/06/2015  CLINICAL DATA:  Chest pain EXAM: PORTABLE CHEST 1 VIEW COMPARISON:  02/11/2015 FINDINGS: Moderate cardiac enlargement. Bibasilar scar like densities are again noted. No pleural effusion or edema identified. No airspace consolidation noted. IMPRESSION: 1. Cardiac enlargement. 2. Bibasilar scarring. Electronically Signed   By: Kerby Moors M.D.   On: 06/06/2015 18:17    ECG  Sinus rhythm with chronic right bundle branch block    ASSESSMENT AND PLAN  1. Hypertensive Emergency: Patient's BP was severely elevated in the [id number] systolic on arrival. We Jeraldean Wechter continue IV nitroglycerin for blood pressure control. Bill Mcvey restart his home antihypertensives including amlodipine, carvedilol, clonidine, Imdur, losartan as well as Lasix. Continue to monitor closely. Rhemi Balbach order low sodium diet.   2. Chest pain: The patient has a history of CAD however his recent chest discomfort is likely secondary to severe uncontrolled hypertension. He is chest pain-free with IV nitroglycerin. We Givanni Staron continue to monitor overnight.   3. Abnormal troponin: Initial troponin in the ED is mildly abnormal at 0.22. This  is in the setting of hypertensive emergency.  Suspect his abnormal troponins likely secondary to demand ischemia from his hypertensive emergency. We Donnah Levert continue to cycle cardiac enzymes 3 to assess trend. For now, continue with plans for IV heparin. We Lettie Czarnecki discontinue IV heparin if no significant elevation in enzymes.  4. CAD: Patient has a known history of CAD, status post multiple PCI's in the past. We suspect his recent chest discomfort and abnormal troponin is likely secondary to demand ischemia from his hypertensive emergency. For now, we Cipriano Millikan plan to continue medical therapy. He has been off of Plavix for the last 3 days in anticipation of back injections. Given his recurrent pain and known CAD, we Nobie Alleyne place him back on Plavix. Kaytlynne Neace re-load with 300 mg tonight, followed by 75 mg daily. We'll reassess need for repeat ischemic eval based on enzyme trend.  5. Chronic LBP: Patient with recurrent CP after being off of Plavix for planned back injections. Given his CAD, we Emer Onnen need to restart Plavix and cancel plans for injection. Jaye Polidori order PRN pain meds.    Signed, Lyda Jester, PA-C 06/06/2015, 7:09 PM   I have seen and examined this patient with [place].  Agree with above, note added to reflect my findings.  On exam, regular rhythm, no murmurs, lungs clear.  Had chest pain while at home x2 which was similar to prior episodes of chest pain when he had his cardiac events.  His BP is elevated in the ER currently.  He has not been taking his plavix due to upcoming back injection.  He has been off this for the last 3-4 days.  The injection is scheduled for Wednesday.  His BP has been around 200 while in the ER without chest pain.  Was started on IV NTG by the ER staff.  Khaled Herda also restart his plavix as we do not currently have a trend on his troponins.  Should they remain flat, his heparin can remain off.  Miana Politte likely opt for medical management as he has severe CAD and this is likely due  to his BP.  Axten Pascucci restart his home BP meds today.  He does say that he has been compliant.    Caitlyn Buchanan M. Bingham Millette MD 06/06/2015 7:51 PM

## 2015-06-06 NOTE — ED Notes (Signed)
Pt here with c/o chest pain central pain , sob , pt received 324 asa and 1 nitro

## 2015-06-06 NOTE — ED Provider Notes (Signed)
CSN: [id number]     Arrival date & time 06/06/15  1703 History   First MD Initiated Contact with Patient 06/06/15 1705     Chief Complaint  Patient presents with  . Chest Pain     (Consider location/radiation/quality/duration/timing/severity/associated sxs/prior Treatment) HPI Comments: The pt is a 59 y/o male - hx of CAD / CHF - has nitro at home Developed Chest heaviness when ambulating today - improved but didn't go away with rest - went away with nitro and asa.  Then got up to walk for EMS and it came back with SOB,  No nausea or diaphoresis - CP free at this time.  Sx are intermittent, related to exertion, no fevers, chills, cough, swelling of legs.  Patient is a 59 y.o. male presenting with chest pain. The history is provided by the patient.  Chest Pain   Past Medical History  Diagnosis Date  . Dyslipidemia   . HTN (hypertension)   . CAD (coronary artery disease)     a. s/p PCI/BMS pRCA 2002; b. PCI pRCA & DES p/m RCA 2006; c. PCI/DES OM4 2007; d. PCI/CBA to RCA for ISR 10/2006; e.08/2012 STEMI/Cath/PCI: LM nl, LAD95-39m (3.0x20 Promus Prem DES), 95/95 ap LAD (1.0-1.26mm), LCX 30p 95d (sm), LPL1 70-80p, patent stent, LPL2 nl, RCA 30p, 40/70 ISR, EF 35-40%.  . Ischemic cardiomyopathy     a. EF 40%; improved to normal;  b. 08/2012 Echo: EF 50-55%, mod LVH.  [name] GERD (gastroesophageal reflux disease)   . Tobacco abuse   . Obesity   . OSA (obstructive sleep apnea)     Does not use CPAP as of 05/2011  . Depression   . History of pneumonia   . Diabetes mellitus ([place])     a. A1c 8.8 08/2012->Metformin initiated. => b. A1c (9/14): 6.6  . Hyperlipidemia   . Myocardial infarction ([place])   . Benign essential HTN 03/09/2015   Past Surgical History  Procedure Laterality Date  . Coronary stent placement  2009  . Coronary angioplasty with stent placement    . Left heart catheterization with coronary angiogram N/A 08/31/2012    Procedure: LEFT HEART CATHETERIZATION WITH CORONARY ANGIOGRAM;   Surgeon: Burnell Blanks, MD;  Location: [facility] CATH LAB;  Service: Cardiovascular;  Laterality: N/A;  . Percutaneous coronary stent intervention (pci-s) Right 08/31/2012    Procedure: PERCUTANEOUS CORONARY STENT INTERVENTION (PCI-S);  Surgeon: Burnell Blanks, MD;  Location: [facility] CATH LAB;  Service: Cardiovascular;  Laterality: Right;  . Left heart catheterization with coronary angiogram N/A 11/16/2013    Procedure: LEFT HEART CATHETERIZATION WITH CORONARY ANGIOGRAM;  Surgeon: Blane Ohara, MD;  Location: [facility] CATH LAB;  Service: Cardiovascular;  Laterality: N/A;  . Percutaneous coronary stent intervention (pci-s)  11/16/2013    Procedure: PERCUTANEOUS CORONARY STENT INTERVENTION (PCI-S);  Surgeon: Blane Ohara, MD;  Location: [facility] CATH LAB;  Service: Cardiovascular;;  . Left heart catheterization with coronary angiogram N/A 03/15/2014    Procedure: LEFT HEART CATHETERIZATION WITH CORONARY ANGIOGRAM;  Surgeon: Peter M Martinique, MD;  Location: [facility] CATH LAB;  Service: Cardiovascular;  Laterality: N/A;   Family History  Problem Relation Age of Onset  . Coronary artery disease    . Depression Mother   . Cancer Mother     ovarian  . Hypertension Mother     Died, 10  . Other Father     motor vehicle accident  . Heart attack Neg Hx   . Stroke Neg Hx    Social History  Substance Use Topics  . Smoking status: Current Every Day Smoker -- 0.50 packs/day for 30 years    Types: Cigarettes  . Smokeless tobacco: None     Comment: trying to cut down  . Alcohol Use: No    Review of Systems  Cardiovascular: Positive for chest pain.  All other systems reviewed and are negative.     Allergies  Penicillins  Home Medications   Prior to Admission medications   Medication Sig Start Date End Date Taking? Authorizing Provider  amLODipine (NORVASC) 10 MG tablet TAKE 1 TABLET BY MOUTH DAILY 03/22/15  Yes Biagio Borg, MD  aspirin 81 MG tablet Take 81 mg by mouth daily.   Yes Historical  Provider, MD  carvedilol (COREG) 3.125 MG tablet Take 1 tablet (3.125 mg total) by mouth 2 (two) times daily with a meal. 10/08/14  Yes Sherren Mocha, MD  cloNIDine (CATAPRES) 0.3 MG tablet Take 1 tablet (0.3 mg total) by mouth 2 (two) times daily. 04/21/15  Yes Scott T [name], PA-C  cyclobenzaprine (FLEXERIL) 10 MG tablet Take 1 tablet (10 mg total) by mouth 3 (three) times daily as needed for muscle spasms. 05/12/15  Yes Lyndal Pulley, DO  escitalopram (LEXAPRO) 10 MG tablet TAKE 1 TABLET BY MOUTH ONCE DAILY 10/01/14  Yes Biagio Borg, MD  famotidine (PEPCID) 40 MG tablet TAKE 1 TABLET BY MOUTH EVERY 12 HOURS 05/25/15  Yes Biagio Borg, MD  furosemide (LASIX) 80 MG tablet Take 80 mg by mouth 2 (two) times daily. 01/28/15  Yes Historical Provider, MD  gabapentin (NEURONTIN) 300 MG capsule TAKE 1 CAPSULE(300 MG) BY MOUTH AT BEDTIME 04/18/15  Yes Lyndal Pulley, DO  isosorbide mononitrate (IMDUR) 30 MG 24 hr tablet Take 3 tablets (90 mg total) by mouth daily. 08/09/14  Yes Scott T [name], PA-C  losartan (COZAAR) 100 MG tablet Take 1 tablet (100 mg total) by mouth daily. 12/10/14  Yes Sherren Mocha, MD  metFORMIN (GLUCOPHAGE-XR) 500 MG 24 hr tablet TAKE 2 TABLETS BY MOUTH EVERY MORNING 03/21/15  Yes Biagio Borg, MD  Multiple Vitamins-Minerals (MULTIVITAMIN WITH MINERALS) tablet Take 1 tablet by mouth daily. CENTRUM SILVER MENS  50 PLUS   Yes Historical Provider, MD  nitroGLYCERIN (NITROSTAT) 0.4 MG SL tablet Place 1 tablet (0.4 mg total) under the tongue every 5 (five) minutes as needed for chest pain. For chest pain 07/29/14  Yes Liliane Shi, PA-C  oxyCODONE (OXY IR/ROXICODONE) 5 MG immediate release tablet 1-2 every 6 hrs as needed for pain 05/25/15  Yes Janith Lima, MD  potassium chloride SA (K-DUR,KLOR-CON) 20 MEQ tablet Take 2 tablets (40 mEq total) by mouth 2 (two) times daily. 09/15/14  Yes Sueanne Margarita, MD  rosuvastatin (CRESTOR) 40 MG tablet Take 1 tablet (40 mg total) by mouth daily.  04/13/15  Yes Biagio Borg, MD  tiZANidine (ZANAFLEX) 4 MG capsule Take 1 capsule (4 mg total) by mouth 3 (three) times daily. 05/24/15  Yes Lyndal Pulley, DO  predniSONE (DELTASONE) 10 MG tablet 3tab by mouth x3day,2tab x 3day,1tab x 3 days 04/13/15   Biagio Borg, MD   BP 192/106 mmHg  Pulse 74  Temp(Src) 99 F (37.2 C) (Oral)  Resp 18  Ht 6\' 1"  (1.854 m)  Wt 326 lb (147.873 kg)  BMI 43.02 kg/m2  SpO2 96% Physical Exam  Constitutional: He appears well-developed and well-nourished. No distress.  HENT:  Head: Normocephalic and atraumatic.  Mouth/Throat: Oropharynx is clear and  moist. No oropharyngeal exudate.  Eyes: Conjunctivae and EOM are normal. Pupils are equal, round, and reactive to light. Right eye exhibits no discharge. Left eye exhibits no discharge. No scleral icterus.  Neck: Normal range of motion. Neck supple. No JVD present. No thyromegaly present.  Cardiovascular: Normal rate, regular rhythm, normal heart sounds and intact distal pulses.  Exam reveals no gallop and no friction rub.   No murmur heard. Pulmonary/Chest: Effort normal and breath sounds normal. No respiratory distress. He has no wheezes. He has no rales.  Abdominal: Soft. Bowel sounds are normal. He exhibits no distension and no mass. There is no tenderness.  Musculoskeletal: Normal range of motion. He exhibits no edema or tenderness.  Lymphadenopathy:    He has no cervical adenopathy.  Neurological: He is alert. Coordination normal.  Skin: Skin is warm and dry. No rash noted. No erythema.  Psychiatric: He has a normal mood and affect. His behavior is normal.  Nursing note and vitals reviewed.   ED Course  Procedures (including critical care time) Labs Review Labs Reviewed  CBC - Abnormal; Notable for the following:    RDW 15.6 (*)    All other components within normal limits  COMPREHENSIVE METABOLIC PANEL - Abnormal; Notable for the following:    Glucose, Bld 228 (*)    All other components within  normal limits  TROPONIN I - Abnormal; Notable for the following:    Troponin I 0.22 (*)    All other components within normal limits  APTT  PROTIME-INR    Imaging Review Dg Chest Portable 1 View  06/06/2015  CLINICAL DATA:  Chest pain EXAM: PORTABLE CHEST 1 VIEW COMPARISON:  02/11/2015 FINDINGS: Moderate cardiac enlargement. Bibasilar scar like densities are again noted. No pleural effusion or edema identified. No airspace consolidation noted. IMPRESSION: 1. Cardiac enlargement. 2. Bibasilar scarring. Electronically Signed   By: Kerby Moors M.D.   On: 06/06/2015 18:17   I have personally reviewed and evaluated these images and lab results as part of my medical decision-making.   EKG Interpretation   Date/Time:  Monday June 06 2015 17:09:51 EST Ventricular Rate:  78 PR Interval:  147 QRS Duration: 168 QT Interval:  435 QTC Calculation: 495 R Axis:   -99 Text Interpretation:  Sinus rhythm Atrial premature complexes Probable  left atrial enlargement Right bundle branch block since last tracing no  significant change Confirmed by Kailea Dannemiller  MD, Arieliz Latino ([id number]) on 06/06/2015  5:20:05 PM      MDM   Final diagnoses:  NSTEMI (non-ST elevated myocardial infarction) ([place])  Hypertensive crisis    The pt is well appearing, there is RBBB on ECG which is unchanged compared with old - his story is conerning for UA, labs, xray, planned consult to cardiology.  Troponin is elevated at 0.22, blood pressure is severely her elevated now over [id number] systolic  The patient was given medications including nitroglycerin drip, heparin, discussed with cardiology who will admit  Cardiac monitoring,  Repeat exam X multiple  CRITICAL CARE Performed by: Johnna Acosta Total critical care time: 35 minutes Critical care time was exclusive of separately billable procedures and treating other patients. Critical care was necessary to treat or prevent imminent or life-threatening  deterioration. Critical care was time spent personally by me on the following activities: development of treatment plan with patient and/or surrogate as well as nursing, discussions with consultants, evaluation of patient's response to treatment, examination of patient, obtaining history from patient or surrogate, ordering and performing treatments and  interventions, ordering and review of laboratory studies, ordering and review of radiographic studies, pulse oximetry and re-evaluation of patient's condition.    Noemi Chapel, MD 06/06/15 [phone]

## 2015-06-06 NOTE — ED Notes (Signed)
Pt placed on monitor upon arrival to room. Pt remains monitored by blood pressure, pulse ox, and 12 lead. Dr. Sabra Heck at bedside.

## 2015-06-07 ENCOUNTER — Encounter (HOSPITAL_COMMUNITY): Payer: Self-pay | Admitting: General Practice

## 2015-06-07 DIAGNOSIS — I161 Hypertensive emergency: Principal | ICD-10-CM

## 2015-06-07 DIAGNOSIS — E785 Hyperlipidemia, unspecified: Secondary | ICD-10-CM

## 2015-06-07 DIAGNOSIS — I248 Other forms of acute ischemic heart disease: Secondary | ICD-10-CM

## 2015-06-07 DIAGNOSIS — I160 Hypertensive urgency: Secondary | ICD-10-CM

## 2015-06-07 LAB — BASIC METABOLIC PANEL
ANION GAP: 9 (ref 5–15)
BUN: 9 mg/dL (ref 6–20)
CALCIUM: 9.3 mg/dL (ref 8.9–10.3)
CO2: 28 mmol/L (ref 22–32)
Chloride: 106 mmol/L (ref 101–111)
Creatinine, Ser: 0.99 mg/dL (ref 0.61–1.24)
GLUCOSE: 223 mg/dL — AB (ref 65–99)
POTASSIUM: 3.4 mmol/L — AB (ref 3.5–5.1)
Sodium: 143 mmol/L (ref 135–145)

## 2015-06-07 LAB — GLUCOSE, CAPILLARY
GLUCOSE-CAPILLARY: 125 mg/dL — AB (ref 65–99)
Glucose-Capillary: 153 mg/dL — ABNORMAL HIGH (ref 65–99)
Glucose-Capillary: 132 mg/dL — ABNORMAL HIGH (ref 65–99)
Glucose-Capillary: 191 mg/dL — ABNORMAL HIGH (ref 65–99)

## 2015-06-07 LAB — CBC
HEMATOCRIT: 44.3 % (ref 39.0–52.0)
Hemoglobin: 14.2 g/dL (ref 13.0–17.0)
MCH: 28.3 pg (ref 26.0–34.0)
MCHC: 32.1 g/dL (ref 30.0–36.0)
MCV: 88.2 fL (ref 78.0–100.0)
PLATELETS: 179 10*3/uL (ref 150–400)
RBC: 5.02 MIL/uL (ref 4.22–5.81)
RDW: 15.5 % (ref 11.5–15.5)
WBC: 9 10*3/uL (ref 4.0–10.5)

## 2015-06-07 LAB — TROPONIN I
TROPONIN I: 0.33 ng/mL — AB (ref <0.031)
Troponin I: 0.27 ng/mL — ABNORMAL HIGH (ref <0.031)

## 2015-06-07 MED ORDER — CARVEDILOL 6.25 MG PO TABS
6.2500 mg | ORAL_TABLET | Freq: Two times a day (BID) | ORAL | Status: DC
  Administered 2015-06-07 – 2015-06-09 (×4): 6.25 mg via ORAL
  Filled 2015-06-07 (×4): qty 1

## 2015-06-07 MED ORDER — CARVEDILOL 3.125 MG PO TABS
3.1250 mg | ORAL_TABLET | Freq: Once | ORAL | Status: AC
  Administered 2015-06-07: 3.125 mg via ORAL
  Filled 2015-06-07: qty 1

## 2015-06-07 MED ORDER — HEPARIN SODIUM (PORCINE) 5000 UNIT/ML IJ SOLN
5000.0000 [IU] | Freq: Three times a day (TID) | INTRAMUSCULAR | Status: DC
  Administered 2015-06-07 – 2015-06-09 (×6): 5000 [IU] via SUBCUTANEOUS
  Filled 2015-06-07 (×6): qty 1

## 2015-06-07 MED ORDER — FUROSEMIDE 10 MG/ML IJ SOLN
80.0000 mg | Freq: Two times a day (BID) | INTRAMUSCULAR | Status: DC
  Administered 2015-06-07 – 2015-06-08 (×2): 80 mg via INTRAVENOUS
  Filled 2015-06-07 (×2): qty 8

## 2015-06-07 MED ORDER — SPIRONOLACTONE 25 MG PO TABS
12.5000 mg | ORAL_TABLET | Freq: Every day | ORAL | Status: DC
  Administered 2015-06-07 – 2015-06-08 (×2): 12.5 mg via ORAL
  Filled 2015-06-07 (×2): qty 1

## 2015-06-07 NOTE — Progress Notes (Signed)
Utilization Review Completed.[name] [name] T12/27/2016  

## 2015-06-07 NOTE — Progress Notes (Signed)
Cardiologist: Dr. [name] Subjective:  No CP, no SOB. Low back pain from laying in bed Admits to salt intake  Objective:  Vital Signs in the last 24 hours: Temp:  [97.7 F (36.5 C)-99 F (37.2 C)] 98.1 F (36.7 C) (12/27 0504) Pulse Rate:  [58-78] 64 (12/27 0814) Resp:  [12-24] 20 (12/26 2137) BP: (123-194)/(68-119) 170/105 mmHg (12/27 0814) SpO2:  [90 %-98 %] 94 % (12/27 0504) Weight:  [321 lb 12.8 oz (145.968 kg)-327 lb 6.4 oz (148.508 kg)] 327 lb 6.4 oz (148.508 kg) (12/27 0508)  Intake/Output from previous day: 12/26 0701 - 12/27 0700 In: 480 [P.O.:480] Out: 900 [Urine:900]   Physical Exam: General: Well developed, well nourished, in no acute distress. Head:  Normocephalic and atraumatic. Lungs: Clear to auscultation and percussion. Heart: Normal S1 and S2.  No murmur, rubs or gallops.  Abdomen: soft, non-tender, positive bowel sounds. Obese Extremities: No clubbing or cyanosis. No edema. Neurologic: Alert and oriented x 3.    Lab Results:  Recent Labs  06/06/15 1756 06/07/15 0040  WBC 10.1 9.0  HGB 15.7 14.2  PLT 197 179    Recent Labs  06/06/15 1756 06/07/15 0040  NA 142 143  K 3.7 3.4*  CL 106 106  CO2 25 28  GLUCOSE 228* 223*  BUN 11 9  CREATININE 1.11 0.99    Recent Labs  06/07/15 0040 06/07/15 0544  TROPONINI 0.33* 0.27*   Hepatic Function Panel  Recent Labs  06/06/15 1756  PROT 6.5  ALBUMIN 3.8  AST 28  ALT 29  ALKPHOS 77  BILITOT 0.5     Imaging: Dg Chest Portable 1 View  06/06/2015  CLINICAL DATA:  Chest pain EXAM: PORTABLE CHEST 1 VIEW COMPARISON:  02/11/2015 FINDINGS: Moderate cardiac enlargement. Bibasilar scar like densities are again noted. No pleural effusion or edema identified. No airspace consolidation noted. IMPRESSION: 1. Cardiac enlargement. 2. Bibasilar scarring. Electronically Signed   By: Kerby Moors M.D.   On: 06/06/2015 18:17   Personally viewed.   Telemetry: No adverse rhythms Personally viewed.     EKG:   NSR RBBBPersonally viewed.  Cardiac Studies:  ECHO 02/11/15: - Left ventricle: Severe hypokinesis base inferior segment. The cavity size was normal. Wall thickness was increased in a pattern of mild LVH. The estimated ejection fraction was 55%. - Left atrium: The atrium was mildly dilated. - Right ventricle: The cavity size was normal. Systolic function was normal. - Right atrium: The atrium was mildly dilated. - Pulmonary arteries: PA peak pressure: 35 mm Hg (S).  Meds: Scheduled Meds: . amLODipine  10 mg Oral Daily  . aspirin  324 mg Oral Once  . aspirin EC  81 mg Oral Daily  . carvedilol  3.125 mg Oral Once  . carvedilol  6.25 mg Oral BID WC  . cloNIDine  0.3 mg Oral BID  . clopidogrel  75 mg Oral Daily  . escitalopram  10 mg Oral Daily  . famotidine  40 mg Oral Q12H  . furosemide  80 mg Oral BID  . gabapentin  300 mg Oral QHS  . heparin  5,000 Units Subcutaneous 3 times per day  . isosorbide mononitrate  90 mg Oral Daily  . losartan  100 mg Oral Daily  . metFORMIN  1,000 mg Oral Q breakfast  . potassium chloride SA  40 mEq Oral BID  . rosuvastatin  40 mg Oral q1800  . spironolactone  12.5 mg Oral Daily  . tiZANidine  4 mg Oral TID  Continuous Infusions:  PRN Meds:.acetaminophen, cyclobenzaprine, nitroGLYCERIN, oxyCODONE  Assessment/Plan:  Active Problems:   Hypertensive emergency   Hypertensive urgency  - troponin elevation associated with this, demand ischemia  - No CP  - Increased coreg to 6.25 BID  - started spironolactone 12.5mg  QD  - Watch BMET  - continue lasix but will change to IV  - replete KCL  - diet modification  Morbid obesity  - weight loss  Hyperlipidemia  - crestor  [name] [name] 06/07/2015, 10:09 AM

## 2015-06-07 NOTE — Care Management Note (Signed)
Case Management Note Marvetta Gibbons RN, BSN Unit 2W-Case Manager [phone] Covering 3W  Patient Details  Name: Sayan Gieser MRN: [id number] Date of Birth: 1956/02/14  Subjective/Objective:    Pt admitted with HTN emergency,                 Action/Plan: PTA pt lived at home, has cane that pt uses at times. Anticipate return home- CM to follow for d/c needs.  Expected Discharge Date:                  Expected Discharge Plan:  Home/Self Care  In-House Referral:     Discharge planning Services  CM Consult  Post Acute Care Choice:    Choice offered to:     DME Arranged:    DME Agency:     HH Arranged:    HH Agency:     Status of Service:  In process, will continue to follow  Medicare Important Message Given:    Date Medicare IM Given:    Medicare IM give by:    Date Additional Medicare IM Given:    Additional Medicare Important Message give by:     If discussed at [place] of Stay Meetings, dates discussed:    Additional Comments:  Dawayne Patricia, RN 06/07/2015, 9:53 AM

## 2015-06-07 NOTE — Plan of Care (Signed)
Problem: Pain Managment: Goal: General experience of comfort will improve Outcome: Progressing Educated patient on importance of keeping up with his meds and using the pain scale correctly.

## 2015-06-07 NOTE — Plan of Care (Signed)
Problem: Activity: Goal: Risk for activity intolerance will decrease Outcome: Progressing Helped patient with moving from the bed, laying down to sitting up and standing at bedside. Educated patient with techniques to help with movement and back pain.

## 2015-06-08 ENCOUNTER — Other Ambulatory Visit: Payer: Commercial Managed Care - HMO

## 2015-06-08 DIAGNOSIS — I25119 Atherosclerotic heart disease of native coronary artery with unspecified angina pectoris: Secondary | ICD-10-CM

## 2015-06-08 DIAGNOSIS — I119 Hypertensive heart disease without heart failure: Secondary | ICD-10-CM

## 2015-06-08 LAB — GLUCOSE, CAPILLARY
GLUCOSE-CAPILLARY: 128 mg/dL — AB (ref 65–99)
GLUCOSE-CAPILLARY: 124 mg/dL — AB (ref 65–99)
Glucose-Capillary: 125 mg/dL — ABNORMAL HIGH (ref 65–99)
Glucose-Capillary: 142 mg/dL — ABNORMAL HIGH (ref 65–99)

## 2015-06-08 MED ORDER — SPIRONOLACTONE 25 MG PO TABS
25.0000 mg | ORAL_TABLET | Freq: Every day | ORAL | Status: DC
  Administered 2015-06-09: 25 mg via ORAL
  Filled 2015-06-08: qty 1

## 2015-06-08 MED ORDER — FUROSEMIDE 80 MG PO TABS
80.0000 mg | ORAL_TABLET | Freq: Two times a day (BID) | ORAL | Status: DC
  Administered 2015-06-08 – 2015-06-09 (×2): 80 mg via ORAL
  Filled 2015-06-08 (×2): qty 1

## 2015-06-08 MED ORDER — SPIRONOLACTONE 25 MG PO TABS
12.5000 mg | ORAL_TABLET | Freq: Once | ORAL | Status: AC
  Administered 2015-06-08: 12.5 mg via ORAL
  Filled 2015-06-08: qty 1

## 2015-06-08 NOTE — Progress Notes (Signed)
Cardiologist: Dr. [name] Subjective:  No CP, no SOB. Low back pain from laying in bed Admits to salt intake  Objective:  Vital Signs in the last 24 hours: Temp:  [97.4 F (36.3 C)-98.3 F (36.8 C)] 97.8 F (36.6 C) (12/28 0425) Pulse Rate:  [63-99] 66 (12/28 0807) Resp:  [15-18] 18 (12/28 0425) BP: (106-171)/(65-108) 147/90 mmHg (12/28 0807) SpO2:  [98 %-100 %] 98 % (12/28 0425) Weight:  [339 lb 14.4 oz (154.178 kg)] 339 lb 14.4 oz (154.178 kg) (12/28 0425)  Intake/Output from previous day: 12/27 0701 - 12/28 0700 In: 900 [P.O.:900] Out: 2450 [Urine:2450]   Physical Exam: General: Well developed, well nourished, in no acute distress. Head:  Normocephalic and atraumatic. Lungs: Clear to auscultation and percussion. Heart: Normal S1 and S2.  No murmur, rubs or gallops.  Abdomen: soft, non-tender, positive bowel sounds. Obese Extremities: No clubbing or cyanosis. No edema. Neurologic: Alert and oriented x 3.    Lab Results:  Recent Labs  06/06/15 1756 06/07/15 0040  WBC 10.1 9.0  HGB 15.7 14.2  PLT 197 179    Recent Labs  06/06/15 1756 06/07/15 0040  NA 142 143  K 3.7 3.4*  CL 106 106  CO2 25 28  GLUCOSE 228* 223*  BUN 11 9  CREATININE 1.11 0.99    Recent Labs  06/07/15 0040 06/07/15 0544  TROPONINI 0.33* 0.27*   Hepatic Function Panel  Recent Labs  06/06/15 1756  PROT 6.5  ALBUMIN 3.8  AST 28  ALT 29  ALKPHOS 77  BILITOT 0.5     Imaging: Dg Chest Portable 1 View  06/06/2015  CLINICAL DATA:  Chest pain EXAM: PORTABLE CHEST 1 VIEW COMPARISON:  02/11/2015 FINDINGS: Moderate cardiac enlargement. Bibasilar scar like densities are again noted. No pleural effusion or edema identified. No airspace consolidation noted. IMPRESSION: 1. Cardiac enlargement. 2. Bibasilar scarring. Electronically Signed   By: Kerby Moors M.D.   On: 06/06/2015 18:17   Personally viewed.   Telemetry: No adverse rhythms Personally viewed.   EKG:   NSR  RBBBPersonally viewed.  Cardiac Studies:  ECHO 02/11/15: - Left ventricle: Severe hypokinesis base inferior segment. The cavity size was normal. Wall thickness was increased in a pattern of mild LVH. The estimated ejection fraction was 55%. - Left atrium: The atrium was mildly dilated. - Right ventricle: The cavity size was normal. Systolic function was normal. - Right atrium: The atrium was mildly dilated. - Pulmonary arteries: PA peak pressure: 35 mm Hg (S).  Meds: Scheduled Meds: . amLODipine  10 mg Oral Daily  . aspirin  324 mg Oral Once  . aspirin EC  81 mg Oral Daily  . carvedilol  6.25 mg Oral BID WC  . cloNIDine  0.3 mg Oral BID  . clopidogrel  75 mg Oral Daily  . escitalopram  10 mg Oral Daily  . famotidine  40 mg Oral Q12H  . furosemide  80 mg Intravenous BID  . gabapentin  300 mg Oral QHS  . heparin  5,000 Units Subcutaneous 3 times per day  . isosorbide mononitrate  90 mg Oral Daily  . losartan  100 mg Oral Daily  . metFORMIN  1,000 mg Oral Q breakfast  . potassium chloride SA  40 mEq Oral BID  . rosuvastatin  40 mg Oral q1800  . spironolactone  12.5 mg Oral Daily  . tiZANidine  4 mg Oral TID   Continuous Infusions:  PRN Meds:.acetaminophen, cyclobenzaprine, nitroGLYCERIN, oxyCODONE  Assessment/Plan:  Active  Problems:   Hypertensive emergency  Chest discomfort:   He is much better now that his BP is better.   I still think he needs a stress test but this can be done as OP since he is stable and doing much better.  Anticipate DC tomorrow . Ambulate today    Hypertensive urgency  - troponin elevation associated with this, demand ischemia  - No CP Continue other meds    -  coreg to 6.25 BID Increased spironolactone to 25 mg a day   - Watch BMET  - will change lasix back to po ( with the addition of the Aldactone 25 a day ).  We should consider changing to torsemide if he does not respond to lasix   - replete KCL  - diet modification  Morbid  obesity  - weight loss  Hyperlipidemia  - crestor  Thayer Headings 06/08/2015, 8:51 AM

## 2015-06-09 ENCOUNTER — Encounter (HOSPITAL_COMMUNITY): Payer: Self-pay | Admitting: Physician Assistant

## 2015-06-09 ENCOUNTER — Other Ambulatory Visit: Payer: Self-pay | Admitting: Physician Assistant

## 2015-06-09 DIAGNOSIS — R079 Chest pain, unspecified: Secondary | ICD-10-CM

## 2015-06-09 LAB — BASIC METABOLIC PANEL
Anion gap: 9 (ref 5–15)
BUN: 13 mg/dL (ref 6–20)
CHLORIDE: 103 mmol/L (ref 101–111)
CO2: 31 mmol/L (ref 22–32)
Calcium: 10 mg/dL (ref 8.9–10.3)
Creatinine, Ser: 1.03 mg/dL (ref 0.61–1.24)
GFR calc Af Amer: >60 mL/min (ref >60)
GFR calc non Af Amer: >60 mL/min (ref >60)
GLUCOSE: 147 mg/dL — AB (ref 65–99)
POTASSIUM: 4.2 mmol/L (ref 3.5–5.1)
Sodium: 143 mmol/L (ref 135–145)

## 2015-06-09 LAB — GLUCOSE, CAPILLARY
GLUCOSE-CAPILLARY: 126 mg/dL — AB (ref 65–99)
Glucose-Capillary: 110 mg/dL — ABNORMAL HIGH (ref 65–99)

## 2015-06-09 MED ORDER — CARVEDILOL 6.25 MG PO TABS: 6.2500 mg | ORAL_TABLET | Freq: Two times a day (BID) | ORAL | Status: DC

## 2015-06-09 MED ORDER — CLOPIDOGREL BISULFATE 75 MG PO TABS: 75.0000 mg | ORAL_TABLET | Freq: Every day | ORAL | Status: DC

## 2015-06-09 MED ORDER — SPIRONOLACTONE 25 MG PO TABS: 25.0000 mg | ORAL_TABLET | Freq: Every day | ORAL | Status: DC

## 2015-06-09 MED ORDER — POTASSIUM CHLORIDE CRYS ER 20 MEQ PO TBCR: 20.0000 meq | EXTENDED_RELEASE_TABLET | Freq: Two times a day (BID) | ORAL | Status: DC

## 2015-06-09 MED ORDER — POTASSIUM CHLORIDE CRYS ER 20 MEQ PO TBCR
20.0000 meq | EXTENDED_RELEASE_TABLET | Freq: Two times a day (BID) | ORAL | Status: DC
  Filled 2015-06-09: qty 1

## 2015-06-09 NOTE — Progress Notes (Signed)
Cardiologist: Dr. [name] Subjective:  No CP, no SOB. Low back pain from laying in bed Admits to salt intake  Objective:  Vital Signs in the last 24 hours: Temp:  [97.6 F (36.4 C)-98 F (36.7 C)] 97.6 F (36.4 C) (12/29 0303) Pulse Rate:  [52-63] 52 (12/29 0303) Resp:  [16-18] 17 (12/29 0303) BP: (130-162)/(84-93) 147/84 mmHg (12/29 0303) SpO2:  [97 %-98 %] 97 % (12/29 0303) Weight:  [338 lb 14.4 oz (153.724 kg)] 338 lb 14.4 oz (153.724 kg) (12/29 0303)  Intake/Output from previous day: 12/28 0701 - 12/29 0700 In: 1080 [P.O.:1080] Out: 2825 [Urine:2825]   Physical Exam: General: Well developed, well nourished, in no acute distress. Head:  Normocephalic and atraumatic. Lungs: Clear to auscultation and percussion. Heart: Normal S1 and S2.  No murmur, rubs or gallops.  Abdomen: soft, non-tender, positive bowel sounds. Obese Extremities: No clubbing or cyanosis. No edema. Neurologic: Alert and oriented x 3.    Lab Results:  Recent Labs  06/06/15 1756 06/07/15 0040  WBC 10.1 9.0  HGB 15.7 14.2  PLT 197 179    Recent Labs  06/06/15 1756 06/07/15 0040  NA 142 143  K 3.7 3.4*  CL 106 106  CO2 25 28  GLUCOSE 228* 223*  BUN 11 9  CREATININE 1.11 0.99    Recent Labs  06/07/15 0040 06/07/15 0544  TROPONINI 0.33* 0.27*   Hepatic Function Panel  Recent Labs  06/06/15 1756  PROT 6.5  ALBUMIN 3.8  AST 28  ALT 29  ALKPHOS 77  BILITOT 0.5     Imaging: No results found. Personally viewed.   Telemetry: No adverse rhythms Personally viewed.   EKG:   NSR RBBB   Cardiac Studies:  ECHO 02/11/15: - Left ventricle: Severe hypokinesis base inferior segment. The cavity size was normal. Wall thickness was increased in a pattern of mild LVH. The estimated ejection fraction was 55%. - Left atrium: The atrium was mildly dilated. - Right ventricle: The cavity size was normal. Systolic function was normal. - Right atrium: The atrium was mildly  dilated. - Pulmonary arteries: PA peak pressure: 35 mm Hg (S).  Meds: Scheduled Meds: . amLODipine  10 mg Oral Daily  . aspirin  324 mg Oral Once  . aspirin EC  81 mg Oral Daily  . carvedilol  6.25 mg Oral BID WC  . cloNIDine  0.3 mg Oral BID  . clopidogrel  75 mg Oral Daily  . escitalopram  10 mg Oral Daily  . famotidine  40 mg Oral Q12H  . furosemide  80 mg Oral BID  . gabapentin  300 mg Oral QHS  . heparin  5,000 Units Subcutaneous 3 times per day  . isosorbide mononitrate  90 mg Oral Daily  . losartan  100 mg Oral Daily  . metFORMIN  1,000 mg Oral Q breakfast  . potassium chloride SA  40 mEq Oral BID  . rosuvastatin  40 mg Oral q1800  . spironolactone  25 mg Oral Daily  . tiZANidine  4 mg Oral TID   Continuous Infusions:  PRN Meds:.acetaminophen, cyclobenzaprine, nitroGLYCERIN, oxyCODONE  Assessment/Plan:  Active Problems:   Hypertensive emergency  Chest discomfort:   He is much better now that his BP is better.   I still think he needs a stress test but this can be done as OP since he is stable and doing much better.  Anticipate DC  today .    Hypertensive urgency  - troponin elevation associated with  this, demand ischemia  - No CP Continue other meds    -  coreg to 6.25 BID Increased spironolactone to 25 mg a day   - Watch BMET  - will change lasix back to po ( with the addition of the Aldactone 25 a day ).  We should consider changing to torsemide if he does not respond to lasix   - replete KCL  - diet modification  Morbid obesity  - weight loss  Hyperlipidemia  - crestor   DC today  We have added Aldactone 25 a day . Will need a BMP on Monday at our office.  Follow up with PA in a week or so ( and will need another BMP to check potassium again at that time .   Nahser, Wonda Cheng 06/09/2015, 10:01 AM

## 2015-06-09 NOTE — Consult Note (Signed)
   [facility] Inpatient Consult   06/09/2015  Gennaro Shanafelt 11-07-1955 [id number]   Patient screened for [facility] Management services. Came to bedside to speak with him. However, he was already discharged.  Marthenia Rolling, MSN-Ed, RN,BSN [facility] Liaison [phone]

## 2015-06-09 NOTE — Care Management Important Message (Signed)
Important Message  Patient Details  Name: [name] [name] MRN: [id number] Date of Birth: [birth year]   Medicare Important Message Given:  Yes    Nathen May 06/09/2015, 12:38 PM

## 2015-06-09 NOTE — Discharge Summary (Signed)
Discharge Summary   Patient ID: [name] [name] MRN: [id number], DOB/AGE: 58-30-1957 58 y.o. Admit date: 06/06/2015 D/C date:     06/09/2015  Primary Cardiologist: Dr. [name]   Principal Problem:   Hypertensive emergency Active Problems:   Tobacco abuse   Obesity, Class III, BMI 40-49.9 (morbid obesity) ([place])   DM2 (diabetes mellitus, type 2) (HCC)   Coronary Artery Disease   Hyperlipidemia   OSA (obstructive sleep apnea)   Benign essential HTN    Admission Dates: 06/06/15-06/09/15 Discharge Diagnosis: chest pain and abnormal troponin in the setting of uncontrolled HTN    HPI: [name] [name] is a 59 y.o. male with a history of CAD s/p multiple stents, ischemic CM, obesity, DM, HLD, HTN, tobacco abuse, OSA not on CPAP, and GERD who presented to [facility] on 06/06/15 for chest pain and elevated troponin in the setting hypertensive urgency.   He has a PMH significant for CAD, s/p PCI to the RCA and PL1, cutting balloon angioplasty secondary to in-stent restenosis of the RCA in 5/08, ischemic cardiomyopathy with previous EF 40% (improved to normal in the past).  Admitted 08/2012 with an anterior STEMI treated with a Promus DES to the LAD. He had diffuse residual disease and medical therapy was recommended. EF was 35-40%. Follow up demonstrated improved LVF with EF 50-55%.   Admitted 11/2013 with NSTEMI. Severe stenosis OM1 treated with PCI (2.75x15 mm Xience Alpine DES).  Admitted 03/2014 with NSTEMI. LHC demonstrated severe disease in the small distal LCx and OM2, moderate disease in the mid RCA and distal LAD. This was not changed significantly since his prior study and medical therapy was continued. He had BB reduced 2/2 bradycardia.   Last seen by Dr. [name] 09/2014. Admitted 02/2015 with chest pain. D-dimer was negative. Troponin levels remained normal. Echocardiogram demonstrated normal LV function. He was followed by cardiology and it was recommended that he undergo OP stress testing  but never completed.   He presented to [facility] on 06/06/15 with sudden onset of severe substernal chest pressure with associated dyspnea, very similar to his previous angina. Symptoms occurred at rest while at home. He took a sublingual nitroglycerin at home which relieved his discomfort. He later had recurrent chest discomfort prompting him to call EMS. He was transported to the [facility] ED and given 4 baby aspirin. He was noted to be severely hypertensive with systolic blood pressures greater than 200. Subsequently he was placed on IV nitroglycerin. His blood pressure is improving and he denied any recurrent chest discomfort while in the ED. He did report that he recently discontinued his Plavix 3 days ago in anticipation for spinal injections for chronic low back pain, that is scheduled for the following Wednesday.   Troponin I level in the ED was 0.22. EKG shows sinus rhythm with chronic right bundle branch block. He was admitted for further w/up and management.   Hospital Course  CAD/Chest discomfort:chest pain resolved with control of his BP. Troponin was mildly elevated but flat c/w demand ischemia in the setting of HTN urgency. I have arranged a 2 day nuclear stress test ( he weighs 338lbs) as an outpatient on 1/4 and 06/15/14.  - he had stopped his plavix for a spinal injection that he missed while admitted. We will resume his plavix. He will talk to his PCP about pain medication for this as he does not think he will try to get the spinal injection.  - continue ASA, statin and BB.   Hypertensive urgency: SBP >  200. Initially managed with IV NTG and IV lasix.  - continued on amlodipine 10mg , clonidine 0.3mg  BID, imdur 30mg  daily, losartan 100mg  daily. Coreg increased to 6.25 BID and he was started spironolactone 12.5mg  QD, which was later increased to 25 mg a day. We will obtain an outpatient BMET on 06/14/14 when he returns for his stress test. K 4.2 today.  - He was started on IV Lasix but  changed back to po on the day of discharge. We should consider changing to torsemide in the future if he does not respond to lasix per Dr. Acie Fredrickson  Demand ischemia: Troponin 0.22--> 0.36--> 0.33--> 0.27. Troponin elevation felt to be 2/2 demand ischemia in the setting of HTN urgency  Morbid obesity - weight loss recommended   Hyperlipidemia - continue Crestor  DMT2: continue metformin   Ischemic CM-->EF normalized: last 2D ECHO in 02/2015 w/ EF 55%. No s/s CHF. Resumed on home dose of lasix 80mg  BID, but Kdur decreased from 40 mEq BID to 20 mEq BID as spironolactone 25mg  was added this admission. We will follow his BMET as above.   The patient has had an uncomplicated hospital course and is recovering well. He has been seen by Dr. Acie Fredrickson today and deemed ready for discharge home. All follow-up appointments have been scheduled. Smoking cessation was disscussed in length. Discharge medications are listed below.   Discharge Vitals: Blood pressure 147/84, pulse 52, temperature 97.6 F (36.4 C), temperature source Oral, resp. rate 17, height 6\' 1"  (1.854 m), weight 338 lb 14.4 oz (153.724 kg), SpO2 97 %.  Labs: Lab Results  Component Value Date   WBC 9.0 06/07/2015   HGB 14.2 06/07/2015   HCT 44.3 06/07/2015   MCV 88.2 06/07/2015   PLT 179 06/07/2015    Recent Labs Lab 06/06/15 1756  06/09/15 1055  NA 142  < > 143  K 3.7  < > 4.2  CL 106  < > 103  CO2 25  < > 31  BUN 11  < > 13  CREATININE 1.11  < > 1.03  CALCIUM 9.9  < > 10.0  PROT 6.5  --   --   BILITOT 0.5  --   --   ALKPHOS 77  --   --   ALT 29  --   --   AST 28  --   --   GLUCOSE 228*  < > 147*  < > = values in this interval not displayed.  Recent Labs  06/06/15 1756 06/06/15 2215 06/07/15 0040 06/07/15 0544  TROPONINI 0.22* 0.36* 0.33* 0.27*   Lab Results  Component Value Date   CHOL 121* 05/03/2015   HDL 46 05/03/2015   LDLCALC 64 05/03/2015   TRIG 57 05/03/2015   Lab Results  Component Value Date    DDIMER 0.43 02/11/2015    Diagnostic Studies/Procedures   Mr Lumbar Spine Wo Contrast  05/14/2015  CLINICAL DATA:  Two month history of low back and left hip and buttock pain. EXAM: MRI LUMBAR SPINE WITHOUT CONTRAST TECHNIQUE: Multiplanar, multisequence MR imaging of the lumbar spine was performed. No intravenous contrast was administered. COMPARISON:  Radiographs 02/14/2008 FINDINGS: Normal alignment of the lumbar vertebral bodies. They demonstrate normal marrow signal. The intervertebral disc spaces are fairly well maintained. The last full intervertebral disc space is labeled L5-S1 and this correlates with the plain films. The conus medullaris terminates at the bottom of L1. No significant paraspinal or retroperitoneal findings. Small renal cysts are noted. Slightly prominent cisterna  Chyle. L1-2:  No significant findings.  Mild facet disease. L2-3: Diffuse bulging annulus with mild flattening of the ventral thecal sac and mild left lateral recess stenosis. There is a shallow broad-based lateral foraminal and extra foraminal disc protrusion on the left which appears to contact and likely irritates the left L2 nerve root. Moderate facet disease. L3-4: Broad-based right foraminal and extra foraminal disc protrusion on the right contacting and displacing the right L3 nerve root. There is also a diffuse bulging annulus, short pedicles and facet disease contributing to mild spinal and bilateral lateral recess stenosis. L4-5: Diffuse bulging annulus and central disc protrusion in combination with short pedicles and advanced facet disease contributing to moderate to moderately severe spinal and bilateral lateral recess stenosis. No significant foraminal stenosis. L5-S1: Advanced facet disease but no disc protrusions, spinal or foraminal stenosis. IMPRESSION: 1. Multilevel disc disease and facet disease. 2. Shallow broad-based lateral foraminal and extra foraminal disc protrusion on the left at L2-3 likely  effecting the left L2 nerve root. 3. Broad-based right foraminal and extra foraminal disc protrusion at L3-4 with mass effect on the right L3 nerve root. There is also multifactorial mild spinal and bilateral lateral recess stenosis at this level. 4. Multifactorial moderate to moderately severe spinal and bilateral lateral recess stenosis at L4-5. Electronically Signed   By: Marijo Sanes M.D.   On: 05/14/2015 12:52   Dg Chest Portable 1 View  06/06/2015  CLINICAL DATA:  Chest pain EXAM: PORTABLE CHEST 1 VIEW COMPARISON:  02/11/2015 FINDINGS: Moderate cardiac enlargement. Bibasilar scar like densities are again noted. No pleural effusion or edema identified. No airspace consolidation noted. IMPRESSION: 1. Cardiac enlargement. 2. Bibasilar scarring. Electronically Signed   By: Kerby Moors M.D.   On: 06/06/2015 18:17    Discharge Medications     Medication List    STOP taking these medications        predniSONE 10 MG tablet  Commonly known as:  DELTASONE      TAKE these medications        amLODipine 10 MG tablet  Commonly known as:  NORVASC  TAKE 1 TABLET BY MOUTH DAILY     aspirin 81 MG tablet  Take 81 mg by mouth daily.     carvedilol 6.25 MG tablet  Commonly known as:  COREG  Take 1 tablet (6.25 mg total) by mouth 2 (two) times daily with a meal.     cloNIDine 0.3 MG tablet  Commonly known as:  CATAPRES  Take 1 tablet (0.3 mg total) by mouth 2 (two) times daily.     clopidogrel 75 MG tablet  Commonly known as:  PLAVIX  Take 1 tablet (75 mg total) by mouth daily.     cyclobenzaprine 10 MG tablet  Commonly known as:  FLEXERIL  Take 1 tablet (10 mg total) by mouth 3 (three) times daily as needed for muscle spasms.     escitalopram 10 MG tablet  Commonly known as:  LEXAPRO  TAKE 1 TABLET BY MOUTH ONCE DAILY     famotidine 40 MG tablet  Commonly known as:  PEPCID  TAKE 1 TABLET BY MOUTH EVERY 12 HOURS     furosemide 80 MG tablet  Commonly known as:  LASIX  Take 80  mg by mouth 2 (two) times daily.     gabapentin 300 MG capsule  Commonly known as:  NEURONTIN  TAKE 1 CAPSULE(300 MG) BY MOUTH AT BEDTIME     isosorbide mononitrate 30 MG 24 hr  tablet  Commonly known as:  IMDUR  Take 3 tablets (90 mg total) by mouth daily.     losartan 100 MG tablet  Commonly known as:  COZAAR  Take 1 tablet (100 mg total) by mouth daily.     metFORMIN 500 MG 24 hr tablet  Commonly known as:  GLUCOPHAGE-XR  TAKE 2 TABLETS BY MOUTH EVERY MORNING     multivitamin with minerals tablet  Take 1 tablet by mouth daily. CENTRUM SILVER MENS  50 PLUS     nitroGLYCERIN 0.4 MG SL tablet  Commonly known as:  NITROSTAT  Place 1 tablet (0.4 mg total) under the tongue every 5 (five) minutes as needed for chest pain. For chest pain     oxyCODONE 5 MG immediate release tablet  Commonly known as:  Oxy IR/ROXICODONE  1-2 every 6 hrs as needed for pain     potassium chloride SA 20 MEQ tablet  Commonly known as:  K-DUR,KLOR-CON  Take 1 tablet (20 mEq total) by mouth 2 (two) times daily.     rosuvastatin 40 MG tablet  Commonly known as:  CRESTOR  Take 1 tablet (40 mg total) by mouth daily.     spironolactone 25 MG tablet  Commonly known as:  ALDACTONE  Take 1 tablet (25 mg total) by mouth daily.     tiZANidine 4 MG capsule  Commonly known as:  ZANAFLEX  Take 1 capsule (4 mg total) by mouth 3 (three) times daily.        Disposition   The patient will be discharged in stable condition to home.  Follow-up Information    Follow up with Ermalinda Barrios, PA-C On 06/21/2015.   Specialty:  Cardiology   Why:  @ 11:45 am   Contact information:   [address] [place] [address] [phone]       Follow up with [facility] On 06/15/2015.   Why:  @ 12:30 for day 1 of your stress test. nothing to eat or drink after midnight the night before.  Please come a little early for blood work at the [facility])     [employer] information:   [place] [phone]      Follow up with [facility] On 06/16/2015.   Why:  @ 12:45 for the second part of your stress test    Contact information:   [place] [phone]        Duration of Discharge Encounter: Greater than 30 minutes including physician and PA time.  Mable Fill R PA-C 06/09/2015, 12:13 PM   Attending Note:   The patient was seen and examined.  Agree with assessment and plan as noted above.  Changes made to the above note as needed.  Pt has done well with the addition of Aldactone Will need  A BMP and office visit in a week    Thayer Headings, Brooke Bonito., MD, [facility] 06/09/2015, 9:44 PM [address]  [place] Pager [phone]

## 2015-06-10 ENCOUNTER — Telehealth: Payer: Self-pay | Admitting: Cardiovascular Disease

## 2015-06-10 NOTE — Telephone Encounter (Signed)
I spoke with the pt and reviewed all of his discharge medications.  I made him aware of his post-hospital appointments.

## 2015-06-10 NOTE — Telephone Encounter (Signed)
°  New Prob ° ° ° °Pt has some questions regarding his medications. Please call. °

## 2015-06-14 ENCOUNTER — Telehealth (HOSPITAL_COMMUNITY): Payer: Self-pay | Admitting: *Deleted

## 2015-06-14 NOTE — Telephone Encounter (Signed)
Patient given detailed instructions per Myocardial Perfusion Study Information Sheet for the test on 06/15/15 at 1230. Patient notified to arrive 15 minutes early and that it is imperative to arrive on time for appointment to keep from having the test rescheduled.  If you need to cancel or reschedule your appointment, please call the office within 24 hours of your appointment. Failure to do so may result in a cancellation of your appointment, and a $50 no show fee. Patient verbalized understanding.[name] [name]    

## 2015-06-15 ENCOUNTER — Ambulatory Visit (HOSPITAL_COMMUNITY): Payer: Commercial Managed Care - HMO | Attending: Cardiology

## 2015-06-15 ENCOUNTER — Encounter: Payer: Self-pay | Admitting: Internal Medicine

## 2015-06-15 ENCOUNTER — Other Ambulatory Visit (INDEPENDENT_AMBULATORY_CARE_PROVIDER_SITE_OTHER): Payer: Commercial Managed Care - HMO | Admitting: *Deleted

## 2015-06-15 ENCOUNTER — Encounter: Payer: Self-pay | Admitting: Cardiology

## 2015-06-15 DIAGNOSIS — R079 Chest pain, unspecified: Secondary | ICD-10-CM | POA: Insufficient documentation

## 2015-06-15 DIAGNOSIS — R9439 Abnormal result of other cardiovascular function study: Secondary | ICD-10-CM | POA: Insufficient documentation

## 2015-06-15 DIAGNOSIS — I517 Cardiomegaly: Secondary | ICD-10-CM | POA: Insufficient documentation

## 2015-06-15 DIAGNOSIS — I10 Essential (primary) hypertension: Secondary | ICD-10-CM | POA: Diagnosis not present

## 2015-06-15 LAB — BASIC METABOLIC PANEL
BUN: 22 mg/dL (ref 7–25)
CALCIUM: 9.5 mg/dL (ref 8.6–10.3)
CO2: 24 mmol/L (ref 20–31)
Chloride: 108 mmol/L (ref 98–110)
Creat: 1.04 mg/dL (ref 0.70–1.33)
Glucose, Bld: 123 mg/dL — ABNORMAL HIGH (ref 65–99)
Potassium: 3.9 mmol/L (ref 3.5–5.3)
SODIUM: 141 mmol/L (ref 135–146)

## 2015-06-15 MED ORDER — REGADENOSON 0.4 MG/5ML IV SOLN
0.4000 mg | Freq: Once | INTRAVENOUS | Status: AC
  Administered 2015-06-15: 0.4 mg via INTRAVENOUS

## 2015-06-15 MED ORDER — TECHNETIUM TC 99M SESTAMIBI GENERIC - CARDIOLITE
32.8000 | Freq: Once | INTRAVENOUS | Status: AC | PRN
  Administered 2015-06-15: 32.8 via INTRAVENOUS

## 2015-06-15 NOTE — Addendum Note (Signed)
Addended by: Eulis Foster on: 06/15/2015 12:00 PM   Modules accepted: Orders

## 2015-06-16 ENCOUNTER — Telehealth: Payer: Self-pay | Admitting: *Deleted

## 2015-06-16 ENCOUNTER — Ambulatory Visit (HOSPITAL_COMMUNITY): Payer: Commercial Managed Care - HMO | Attending: Cardiovascular Disease

## 2015-06-16 ENCOUNTER — Other Ambulatory Visit: Payer: Self-pay | Admitting: Internal Medicine

## 2015-06-16 LAB — MYOCARDIAL PERFUSION IMAGING
CHL CUP NUCLEAR SRS: 16
CHL CUP NUCLEAR SSS: 19
CSEPPHR: 87 {beats}/min
LVDIAVOL: 221 mL
LVSYSVOL: 144 mL
NUC STRESS TID: 1.15
RATE: 0.33
Rest HR: 73 {beats}/min
SDS: 3

## 2015-06-16 MED ORDER — TECHNETIUM TC 99M SESTAMIBI GENERIC - CARDIOLITE
32.3000 | Freq: Once | INTRAVENOUS | Status: AC | PRN
  Administered 2015-06-16: 32.3 via INTRAVENOUS

## 2015-06-16 NOTE — Telephone Encounter (Signed)
-----   Message from Eileen Stanford, PA-C sent at 06/16/2015  7:51 AM EST ----- Labs looks good. Continue same meds. He has f/u with michelle lenze next week

## 2015-06-16 NOTE — Telephone Encounter (Signed)
Per Nell Range, PA-C, called pt to let him know that his labs was good and just to continue his medications and keep his f/u appt with Estella Husk, PA-C 06/21/15.  Pt verbalized understanding.

## 2015-06-16 NOTE — Telephone Encounter (Signed)
-----   Message from Eileen Stanford, PA-C sent at 06/16/2015  4:25 PM EST ----- Heart pumping function weaker than expected. No ischemia. No changes. He should follow up with Estella Husk on 06/21/15 as previously scheduled.

## 2015-06-16 NOTE — Telephone Encounter (Signed)
Called pt, per [name] [name], to advise him that his EF% is lower than we thought it would be, and it is lower than the last stress test.  Pt was advised that there was no changes, but we would like for him to keep his f/u appt with Estella Husk, PA-C on 06/21/15.  Pt verbalized understanding.

## 2015-06-17 ENCOUNTER — Other Ambulatory Visit: Payer: Self-pay | Admitting: Internal Medicine

## 2015-06-21 ENCOUNTER — Encounter: Payer: Commercial Managed Care - HMO | Admitting: Physician Assistant

## 2015-06-22 ENCOUNTER — Encounter: Payer: Self-pay | Admitting: Physician Assistant

## 2015-06-23 ENCOUNTER — Other Ambulatory Visit: Payer: Self-pay | Admitting: Physician Assistant

## 2015-06-24 DIAGNOSIS — G4733 Obstructive sleep apnea (adult) (pediatric): Secondary | ICD-10-CM | POA: Diagnosis not present

## 2015-06-27 ENCOUNTER — Ambulatory Visit (INDEPENDENT_AMBULATORY_CARE_PROVIDER_SITE_OTHER): Payer: Commercial Managed Care - HMO | Admitting: Internal Medicine

## 2015-06-27 ENCOUNTER — Other Ambulatory Visit (INDEPENDENT_AMBULATORY_CARE_PROVIDER_SITE_OTHER): Payer: Commercial Managed Care - HMO

## 2015-06-27 ENCOUNTER — Encounter: Payer: Self-pay | Admitting: Internal Medicine

## 2015-06-27 VITALS — BP 122/78 | HR 62 | Temp 98.5°F | Resp 20 | Ht 73.0 in | Wt 343.0 lb

## 2015-06-27 DIAGNOSIS — G8929 Other chronic pain: Secondary | ICD-10-CM

## 2015-06-27 DIAGNOSIS — Z0000 Encounter for general adult medical examination without abnormal findings: Secondary | ICD-10-CM

## 2015-06-27 DIAGNOSIS — E1159 Type 2 diabetes mellitus with other circulatory complications: Secondary | ICD-10-CM

## 2015-06-27 DIAGNOSIS — Z23 Encounter for immunization: Secondary | ICD-10-CM

## 2015-06-27 DIAGNOSIS — I10 Essential (primary) hypertension: Secondary | ICD-10-CM

## 2015-06-27 DIAGNOSIS — Z0001 Encounter for general adult medical examination with abnormal findings: Secondary | ICD-10-CM | POA: Insufficient documentation

## 2015-06-27 DIAGNOSIS — M545 Low back pain, unspecified: Secondary | ICD-10-CM

## 2015-06-27 LAB — BASIC METABOLIC PANEL
BUN: 21 mg/dL (ref 6–23)
CALCIUM: 9.9 mg/dL (ref 8.4–10.5)
CO2: 26 mEq/L (ref 19–32)
CREATININE: 1.35 mg/dL (ref 0.40–1.50)
Chloride: 105 mEq/L (ref 96–112)
GFR: 69.72 mL/min (ref >60.00)
Glucose, Bld: 144 mg/dL — ABNORMAL HIGH (ref 70–99)
Potassium: 4.5 mEq/L (ref 3.5–5.1)
Sodium: 143 mEq/L (ref 135–145)

## 2015-06-27 LAB — LIPID PANEL
CHOL/HDL RATIO: 3
Cholesterol: 113 mg/dL (ref 0–200)
HDL: 36.2 mg/dL — AB (ref >39.00)
LDL Cholesterol: 54 mg/dL (ref 0–99)
NONHDL: 76.72
TRIGLYCERIDES: 113 mg/dL (ref 0.0–149.0)
VLDL: 22.6 mg/dL (ref 0.0–40.0)

## 2015-06-27 LAB — TSH: TSH: 1.31 u[IU]/mL (ref 0.35–4.50)

## 2015-06-27 LAB — HEPATIC FUNCTION PANEL
ALT: 22 U/L (ref 0–53)
AST: 18 U/L (ref 0–37)
Albumin: 4.2 g/dL (ref 3.5–5.2)
Alkaline Phosphatase: 80 U/L (ref 39–117)
BILIRUBIN DIRECT: 0.1 mg/dL (ref 0.0–0.3)
BILIRUBIN TOTAL: 0.3 mg/dL (ref 0.2–1.2)
TOTAL PROTEIN: 6.8 g/dL (ref 6.0–8.3)

## 2015-06-27 LAB — CBC WITH DIFFERENTIAL/PLATELET
BASOS ABS: 0 10*3/uL (ref 0.0–0.1)
BASOS PCT: 0.4 % (ref 0.0–3.0)
EOS ABS: 0.3 10*3/uL (ref 0.0–0.7)
Eosinophils Relative: 4 % (ref 0.0–5.0)
HEMATOCRIT: 45.4 % (ref 39.0–52.0)
HEMOGLOBIN: 14.7 g/dL (ref 13.0–17.0)
LYMPHS PCT: 50.3 % — AB (ref 12.0–46.0)
Lymphs Abs: 3.8 10*3/uL (ref 0.7–4.0)
MCHC: 32.4 g/dL (ref 30.0–36.0)
MCV: 87.4 fl (ref 78.0–100.0)
MONOS PCT: 10.8 % (ref 3.0–12.0)
Monocytes Absolute: 0.8 10*3/uL (ref 0.1–1.0)
NEUTROS ABS: 2.6 10*3/uL (ref 1.4–7.7)
Neutrophils Relative %: 34.5 % — ABNORMAL LOW (ref 43.0–77.0)
PLATELETS: 244 10*3/uL (ref 150.0–400.0)
RBC: 5.2 Mil/uL (ref 4.22–5.81)
RDW: 15.6 % — ABNORMAL HIGH (ref 11.5–15.5)
WBC: 7.5 10*3/uL (ref 4.0–10.5)

## 2015-06-27 LAB — URINALYSIS, ROUTINE W REFLEX MICROSCOPIC
BILIRUBIN URINE: NEGATIVE
KETONES UR: NEGATIVE
Leukocytes, UA: NEGATIVE
NITRITE: NEGATIVE
SPECIFIC GRAVITY, URINE: 1.02 (ref 1.000–1.030)
Total Protein, Urine: 30 — AB
URINE GLUCOSE: NEGATIVE
UROBILINOGEN UA: 0.2 (ref 0.0–1.0)
pH: 5.5 (ref 5.0–8.0)

## 2015-06-27 LAB — MICROALBUMIN / CREATININE URINE RATIO
Creatinine,U: 101.8 mg/dL
MICROALB UR: 33.2 mg/dL — AB (ref 0.0–1.9)
MICROALB/CREAT RATIO: 32.6 mg/g — AB (ref 0.0–30.0)

## 2015-06-27 LAB — PSA: PSA: 0.75 ng/mL (ref 0.10–4.00)

## 2015-06-27 LAB — HEMOGLOBIN A1C: HEMOGLOBIN A1C: 8 % — AB (ref 4.6–6.5)

## 2015-06-27 MED ORDER — TRAMADOL HCL 50 MG PO TABS: 50.0000 mg | ORAL_TABLET | Freq: Four times a day (QID) | ORAL | Status: DC | PRN

## 2015-06-27 NOTE — Assessment & Plan Note (Signed)
stable overall by history and exam, recent data reviewed with pt, and pt to continue medical treatment as before,  to f/u any worsening symptoms or concerns Lab Results  Component Value Date   HGBA1C 7.0* 02/11/2015

## 2015-06-27 NOTE — Progress Notes (Signed)
Pre visit review using our clinic review tool, if applicable. No additional management support is needed unless otherwise documented below in the visit note. 

## 2015-06-27 NOTE — Assessment & Plan Note (Signed)
[place] for tral tramadol prn, cont muscle relaxer prn, for pain clinic referral,  to f/u any worsening symptoms or concerns

## 2015-06-27 NOTE — Progress Notes (Signed)
Subjective:    Patient ID: [name] [name], male    DOB: Jul 14, 1955, 60 y.o.   MRN: [id number]  HPI  Here for wellness and f/u;  Overall doing ok;  Pt denies Chest pain, worsening SOB, DOE, wheezing, orthopnea, PND, worsening LE edema, palpitations, dizziness or syncope.  Pt denies neurological change such as new headache, facial or extremity weakness.  Pt denies polydipsia, polyuria, or low sugar symptoms. Pt states overall good compliance with treatment and medications, good tolerability, and has been trying to follow appropriate diet.  Pt denies worsening depressive symptoms, suicidal ideation or panic. No fever, night sweats, wt loss, loss of appetite, or other constitutional symptoms.  Pt states good ability with ADL's, has low fall risk, home safety reviewed and adequate, no other significant changes in hearing or vision, and only occasionally active with exercise, due to ongoing back pain.  Is very attentive to low sodium diet recently, and working on wt loss. BP Readings from Last 3 Encounters:  06/27/15 122/78  06/09/15 147/84  05/17/15 148/94   Was off plavix 2-3 days prior to HTN emergency, not eligible to stop in future for ESI, but Pt continues to have recurring LBP without change in severity, bowel or bladder change, fever, wt loss,  worsening LE pain/numbness/weakness, gait change or falls. Walks with cane.  Sees Dr Smith/sport med, with some good results except pain still not well controlled. Another issue is he lost his driver license in his wallet and cannot get controlled med without ID, in process of getting replacement drivers license.  Out of oxycodone, willing to try tramadol.  Recent MRI dec 2016 -  IMPRESSION: 1. Multilevel disc disease and facet disease. 2. Shallow broad-based lateral foraminal and extra foraminal disc protrusion on the left at L2-3 likely effecting the left L2 nerve root. 3. Broad-based right foraminal and extra foraminal disc protrusion at L3-4 with  mass effect on the right L3 nerve root. There is also multifactorial mild spinal and bilateral lateral recess stenosis at this level. 4. Multifactorial moderate to moderately severe spinal and bilateral lateral recess stenosis at L4-5.  Past Medical History  Diagnosis Date  . HTN (hypertension)   . CAD (coronary artery disease)     a. s/p PCI/BMS pRCA 2002; b. PCI pRCA & DES p/m RCA 2006; c. PCI/DES OM4 2007; d. PCI/CBA to RCA for ISR 10/2006; e.08/2012 STEMI/Cath/PCI: LM nl, LAD95-48m (3.0x20 Promus Prem DES), 95/95 ap LAD (1.0-1.25mm), LCX 30p 95d (sm), LPL1 70-80p, patent stent, LPL2 nl, RCA 30p, 40/70 ISR, EF 35-40%.  . Ischemic cardiomyopathy     a. EF 40%; improved to normal;  b. 08/2012 Echo: EF 50-55%, mod LVH.  [name] GERD (gastroesophageal reflux disease)   . Tobacco abuse   . Obesity   . OSA (obstructive sleep apnea)     Does not use CPAP as of 05/2011  . Depression   . History of pneumonia   . Diabetes mellitus ([place])     a. A1c 8.8 08/2012->Metformin initiated. => b. A1c (9/14): 6.6  . Hyperlipidemia   . Benign essential HTN 03/09/2015   Past Surgical History  Procedure Laterality Date  . Coronary stent placement  2009  . Coronary angioplasty with stent placement    . Left heart catheterization with coronary angiogram N/A 08/31/2012    Procedure: LEFT HEART CATHETERIZATION WITH CORONARY ANGIOGRAM;  Surgeon: Burnell Blanks, MD;  Location: [facility] CATH LAB;  Service: Cardiovascular;  Laterality: N/A;  . Percutaneous coronary stent intervention (pci-s) Right  08/31/2012    Procedure: PERCUTANEOUS CORONARY STENT INTERVENTION (PCI-S);  Surgeon: Burnell Blanks, MD;  Location: [facility] CATH LAB;  Service: Cardiovascular;  Laterality: Right;  . Left heart catheterization with coronary angiogram N/A 11/16/2013    Procedure: LEFT HEART CATHETERIZATION WITH CORONARY ANGIOGRAM;  Surgeon: Blane Ohara, MD;  Location: [facility] CATH LAB;  Service: Cardiovascular;  Laterality: N/A;  . Percutaneous  coronary stent intervention (pci-s)  11/16/2013    Procedure: PERCUTANEOUS CORONARY STENT INTERVENTION (PCI-S);  Surgeon: Blane Ohara, MD;  Location: [facility] CATH LAB;  Service: Cardiovascular;;  . Left heart catheterization with coronary angiogram N/A 03/15/2014    Procedure: LEFT HEART CATHETERIZATION WITH CORONARY ANGIOGRAM;  Surgeon: Peter M Martinique, MD;  Location: [facility] CATH LAB;  Service: Cardiovascular;  Laterality: N/A;    reports that he has been smoking Cigarettes.  He has a 15 pack-year smoking history. He has never used smokeless tobacco. He reports that he does not drink alcohol or use illicit drugs. family history includes Cancer in his mother; Depression in his mother; Hypertension in his mother; Other in his father. There is no history of Heart attack or Stroke. Allergies  Allergen Reactions  . Penicillins Other (See Comments)    Unknown childhood reaction   Current Outpatient Prescriptions on File Prior to Visit  Medication Sig Dispense Refill  . amLODipine (NORVASC) 10 MG tablet TAKE 1 TABLET BY MOUTH EVERY DAY 90 tablet 3  . aspirin 81 MG tablet Take 81 mg by mouth daily.    . carvedilol (COREG) 6.25 MG tablet Take 1 tablet (6.25 mg total) by mouth 2 (two) times daily with a meal. 60 tablet 6  . cloNIDine (CATAPRES) 0.3 MG tablet Take 1 tablet (0.3 mg total) by mouth 2 (two) times daily. 60 tablet 11  . clopidogrel (PLAVIX) 75 MG tablet Take 1 tablet (75 mg total) by mouth daily. 30 tablet 6  . cyclobenzaprine (FLEXERIL) 10 MG tablet Take 1 tablet (10 mg total) by mouth 3 (three) times daily as needed for muscle spasms. 30 tablet 0  . escitalopram (LEXAPRO) 10 MG tablet TAKE 1 TABLET BY MOUTH ONCE DAILY 90 tablet 2  . famotidine (PEPCID) 40 MG tablet TAKE 1 TABLET BY MOUTH EVERY 12 HOURS 60 tablet 10  . furosemide (LASIX) 80 MG tablet Take 80 mg by mouth 2 (two) times daily.  5  . gabapentin (NEURONTIN) 300 MG capsule TAKE 1 CAPSULE(300 MG) BY MOUTH AT BEDTIME 90 capsule 1  .  isosorbide mononitrate (IMDUR) 30 MG 24 hr tablet Take 3 tablets (90 mg total) by mouth daily. 120 tablet 6  . losartan (COZAAR) 100 MG tablet Take 1 tablet (100 mg total) by mouth daily. 90 tablet 3  . metFORMIN (GLUCOPHAGE-XR) 500 MG 24 hr tablet TAKE 2 TABLETS BY MOUTH EVERY MORNING 180 tablet 1  . Multiple Vitamins-Minerals (MULTIVITAMIN WITH MINERALS) tablet Take 1 tablet by mouth daily. CENTRUM SILVER MENS  50 PLUS    . nitroGLYCERIN (NITROSTAT) 0.4 MG SL tablet Place 1 tablet (0.4 mg total) under the tongue every 5 (five) minutes as needed for chest pain. For chest pain 25 tablet 2  . nitroGLYCERIN (NITROSTAT) 0.4 MG SL tablet PLACE 1 TABLET UNDER THE TONGUE EVERY 5 MINUTES AS NEEDED FOR CHEST PAIN 25 tablet 5  . oxyCODONE (OXY IR/ROXICODONE) 5 MG immediate release tablet 1-2 every 6 hrs as needed for pain 60 tablet 0  . potassium chloride SA (K-DUR,KLOR-CON) 20 MEQ tablet Take 1 tablet (20 mEq total)  by mouth 2 (two) times daily. 180 tablet 5  . rosuvastatin (CRESTOR) 40 MG tablet Take 1 tablet (40 mg total) by mouth daily. 90 tablet 3  . spironolactone (ALDACTONE) 25 MG tablet Take 1 tablet (25 mg total) by mouth daily. 30 tablet 6  . tiZANidine (ZANAFLEX) 4 MG capsule Take 1 capsule (4 mg total) by mouth 3 (three) times daily. 30 capsule 0   No current facility-administered medications on file prior to visit.     Review of Systems Constitutional: Negative for increased diaphoresis, other activity, appetite or siginficant weight change other than noted HENT: Negative for worsening hearing loss, ear pain, facial swelling, mouth sores and neck stiffness.   Eyes: Negative for other worsening pain, redness or visual disturbance.  Respiratory: Negative for shortness of breath and wheezing  Cardiovascular: Negative for chest pain and palpitations.  Gastrointestinal: Negative for diarrhea, blood in stool, abdominal distention or other pain Genitourinary: Negative for hematuria, flank pain or  change in urine volume.  Musculoskeletal: Negative for myalgias or other joint complaints.  Skin: Negative for color change and wound or drainage.  Neurological: Negative for syncope and numbness. other than noted Hematological: Negative for adenopathy. or other swelling Psychiatric/Behavioral: Negative for hallucinations, SI, self-injury, decreased concentration or other worsening agitation.      Objective:   Physical Exam BP 122/78 mmHg  Pulse 62  Temp(Src) 98.5 F (36.9 C) (Oral)  Resp 20  Ht 6\' 1"  (1.854 m)  Wt 343 lb (155.584 kg)  BMI 45.26 kg/m2  SpO2 95% VS noted,  Constitutional: Pt is oriented to person, place, and time. Appears well-developed and well-nourished, in no significant distress Head: Normocephalic and atraumatic.  Right Ear: External ear normal.  Left Ear: External ear normal.  Nose: Nose normal.  Mouth/Throat: Oropharynx is clear and moist.  Eyes: Conjunctivae and EOM are normal. Pupils are equal, round, and reactive to light.  Neck: Normal range of motion. Neck supple. No JVD present. No tracheal deviation present or significant neck LA or mass Cardiovascular: Normal rate, regular rhythm, normal heart sounds and intact distal pulses.   Pulmonary/Chest: Effort normal and breath sounds without rales or wheezing  Abdominal: Soft. Bowel sounds are normal. NT. No HSM  Musculoskeletal: Normal range of motion. Exhibits no edema.  Lymphadenopathy:  Has no cervical adenopathy.  Neurological: Pt is alert and oriented to person, place, and time. Pt has normal reflexes. No cranial nerve deficit. Motor grossly intact, has decreased sens to LLE to LT Spine: diffuse loewr lumbar tender thoughtout,  Skin: Skin is warm and dry. No rash noted.  Psychiatric:  Has normal mood and affect. Behavior is normal.   Lab Results  Component Value Date   WBC 9.0 06/07/2015   HGB 14.2 06/07/2015   HCT 44.3 06/07/2015   PLT 179 06/07/2015   GLUCOSE 123* 06/15/2015   CHOL 121*  05/03/2015   TRIG 57 05/03/2015   HDL 46 05/03/2015   LDLCALC 64 05/03/2015   ALT 29 06/06/2015   AST 28 06/06/2015   NA 141 06/15/2015   K 3.9 06/15/2015   CL 108 06/15/2015   CREATININE 1.04 06/15/2015   BUN 22 06/15/2015   CO2 24 06/15/2015   TSH 0.97 01/26/2014   PSA 0.75 01/26/2014   INR 0.88 06/06/2015   HGBA1C 7.0* 02/11/2015   MICROALBUR 16.4* 01/26/2014       Assessment & Plan:

## 2015-06-27 NOTE — Patient Instructions (Signed)
You had the Pneumovax shot today (pneumonia shot)  Please take all new medication as prescribed - the tramadol  Please continue all other medications as before, and refills have been done if requested.  Please have the pharmacy call with any other refills you may need.  Please continue your efforts at being more active, low cholesterol diet, and weight control.  You are otherwise up to date with prevention measures today.  Please keep your appointments with your specialists as you may have planned  You will be contacted regarding the referral for: pain clinic  Please go to the LAB in the Basement (turn left off the elevator) for the tests to be done today  You will be contacted by phone if any changes need to be made immediately.  Otherwise, you will receive a letter about your results with an explanation, but please check with MyChart first.  Please remember to sign up for MyChart if you have not done so, as this will be important to you in the future with finding out test results, communicating by private email, and scheduling acute appointments online when needed.  Please return in 6 months, or sooner if needed, with Lab testing done 3-5 days before

## 2015-06-27 NOTE — Addendum Note (Signed)
Addended by: Della Goo C on: 06/27/2015 10:01 AM   Modules accepted: Orders

## 2015-06-27 NOTE — Assessment & Plan Note (Signed)
stable overall by history and exam, recent data reviewed with pt, and pt to continue medical treatment as before,  to f/u any worsening symptoms or concerns BP Readings from Last 3 Encounters:  06/27/15 122/78  06/09/15 147/84  05/17/15 148/94

## 2015-06-28 ENCOUNTER — Encounter: Payer: Self-pay | Admitting: Physician Assistant

## 2015-06-28 ENCOUNTER — Other Ambulatory Visit: Payer: Self-pay | Admitting: Internal Medicine

## 2015-06-28 ENCOUNTER — Ambulatory Visit (INDEPENDENT_AMBULATORY_CARE_PROVIDER_SITE_OTHER): Payer: Commercial Managed Care - HMO | Admitting: Physician Assistant

## 2015-06-28 VITALS — BP 100/62 | HR 60 | Ht 73.0 in | Wt 345.8 lb

## 2015-06-28 DIAGNOSIS — I2510 Atherosclerotic heart disease of native coronary artery without angina pectoris: Secondary | ICD-10-CM

## 2015-06-28 DIAGNOSIS — I255 Ischemic cardiomyopathy: Secondary | ICD-10-CM

## 2015-06-28 DIAGNOSIS — I519 Heart disease, unspecified: Secondary | ICD-10-CM

## 2015-06-28 DIAGNOSIS — Z720 Tobacco use: Secondary | ICD-10-CM

## 2015-06-28 DIAGNOSIS — I10 Essential (primary) hypertension: Secondary | ICD-10-CM

## 2015-06-28 LAB — HEPATITIS C ANTIBODY: HCV AB: NEGATIVE

## 2015-06-28 MED ORDER — METFORMIN HCL ER 500 MG PO TB24: ORAL_TABLET | ORAL | Status: DC

## 2015-06-28 MED ORDER — FAMOTIDINE 40 MG PO TABS: 40.0000 mg | ORAL_TABLET | Freq: Two times a day (BID) | ORAL | Status: DC

## 2015-06-28 NOTE — Assessment & Plan Note (Signed)
Patient continues to smoke less than a pack of cigarettes daily. Had discussion with him about the importance of smoking cessation. He says he has the nicotine gum at home. He will consider the nicotine patch.

## 2015-06-28 NOTE — Assessment & Plan Note (Signed)
Last cath in 03/2014 showed diffuse CAD with severe disease in the small distal circumflex and OM 2 and moderate disease in the mid RCA and distal LAD. Recent nuclear stress test was without ischemia. No chest pain. Continue medical therapy.

## 2015-06-28 NOTE — Patient Instructions (Addendum)
Medication Instructions:  Your physician recommends that you continue on your current medications as directed. Please refer to the Current Medication list given to you today. We have sent a refill in for the Pepcid into your local pharmacy.   Labwork: None ordered  Testing/Procedures: Your physician has requested that you have an echocardiogram. Echocardiography is a painless test that uses sound waves to create images of your heart. It provides your doctor with information about the size and shape of your heart and how well your heart's chambers and valves are working. This procedure takes approximately one hour. There are no restrictions for this procedure.    Follow-Up: Your physician recommends that you schedule a follow-up appointment in: 2 Tea  Any Other Special Instructions Will Be Listed Below (If Applicable).  Echocardiogram An echocardiogram, or echocardiography, uses sound waves (ultrasound) to produce an image of your heart. The echocardiogram is simple, painless, obtained within a short period of time, and offers valuable information to your health care provider. The images from an echocardiogram can provide information such as:  Evidence of coronary artery disease (CAD).  Heart size.  Heart muscle function.  Heart valve function.  Aneurysm detection.  Evidence of a past heart attack.  Fluid buildup around the heart.  Heart muscle thickening.  Assess heart valve function. LET [facility] CARE PROVIDER KNOW ABOUT:  Any allergies you have.  All medicines you are taking, including vitamins, herbs, eye drops, creams, and over-the-counter medicines.  Previous problems you or members of your family have had with the use of anesthetics.  Any blood disorders you have.  Previous surgeries you have had.  Medical conditions you have.  Possibility of pregnancy, if this applies. BEFORE THE PROCEDURE  No special preparation is needed. Eat and  drink normally.  PROCEDURE   In order to produce an image of your heart, gel will be applied to your chest and a wand-like tool (transducer) will be moved over your chest. The gel will help transmit the sound waves from the transducer. The sound waves will harmlessly bounce off your heart to allow the heart images to be captured in real-time motion. These images will then be recorded.  You may need an IV to receive a medicine that improves the quality of the pictures. AFTER THE PROCEDURE You may return to your normal schedule including diet, activities, and medicines, unless your health care provider tells you otherwise.   This information is not intended to replace advice given to you by your health care provider. Make sure you discuss any questions you have with your health care provider.   Document Released: 05/25/2000 Document Revised: 06/18/2014 Document Reviewed: 02/02/2013 Elsevier Interactive Patient Education 2016 Else[employer] Low-Sodium Eating Plan Sodium raises blood pressure and causes water to be held in the body. Getting less sodium from food will help lower your blood pressure, reduce any swelling, and protect your heart, liver, and kidneys. We get sodium by adding salt (sodium chloride) to food. Most of our sodium comes from canned, boxed, and frozen foods. Restaurant foods, fast foods, and pizza are also very high in sodium. Even if you take medicine to lower your blood pressure or to reduce fluid in your body, getting less sodium from your food is important. WHAT IS MY PLAN? Most people should limit their sodium intake to 2,300 mg a day. Your health care provider recommends that you limit your sodium intake to 2 GRAMS  a day.  WHAT DO I NEED TO  KNOW ABOUT THIS EATING PLAN? For the low-sodium eating plan, you will follow these general guidelines:  Choose foods with a % Daily Value for sodium of less than 5% (as listed on the food label).   Use salt-free seasonings or herbs  instead of table salt or sea salt.   Check with your health care provider or pharmacist before using salt substitutes.   Eat fresh foods.  Eat more vegetables and fruits.  Limit canned vegetables. If you do use them, rinse them well to decrease the sodium.   Limit cheese to 1 oz (28 g) per day.   Eat lower-sodium products, often labeled as "lower sodium" or "no salt added."  Avoid foods that contain monosodium glutamate (MSG). MSG is sometimes added to [place] food and some canned foods.  Check food labels (Nutrition Facts labels) on foods to learn how much sodium is in one serving.  Eat more home-cooked food and less restaurant, buffet, and fast food.  When eating at a restaurant, ask that your food be prepared with less salt, or no salt if possible.  HOW DO I READ FOOD LABELS FOR SODIUM INFORMATION? The Nutrition Facts label lists the amount of sodium in one serving of the food. If you eat more than one serving, you must multiply the listed amount of sodium by the number of servings. Food labels may also identify foods as:  Sodium free--Less than 5 mg in a serving.  Very low sodium--35 mg or less in a serving.  Low sodium--140 mg or less in a serving.  Light in sodium--50% less sodium in a serving. For example, if a food that usually has 300 mg of sodium is changed to become light in sodium, it will have 150 mg of sodium.  Reduced sodium--25% less sodium in a serving. For example, if a food that usually has 400 mg of sodium is changed to reduced sodium, it will have 300 mg of sodium. WHAT FOODS CAN I EAT? Grains Low-sodium cereals, including oats, puffed wheat and rice, and shredded wheat cereals. Low-sodium crackers. Unsalted rice and pasta. Lower-sodium bread.  Vegetables Frozen or fresh vegetables. Low-sodium or reduced-sodium canned vegetables. Low-sodium or reduced-sodium tomato sauce and paste. Low-sodium or reduced-sodium tomato and vegetable juices.   Fruits Fresh, frozen, and canned fruit. Fruit juice.  Meat and Other Protein Products Low-sodium canned tuna and salmon. Fresh or frozen meat, poultry, seafood, and fish. Lamb. Unsalted nuts. Dried beans, peas, and lentils without added salt. Unsalted canned beans. Homemade soups without salt. Eggs.  Dairy Milk. Soy milk. Ricotta cheese. Low-sodium or reduced-sodium cheeses. Yogurt.  Condiments Fresh and dried herbs and spices. Salt-free seasonings. Onion and garlic powders. Low-sodium varieties of mustard and ketchup. Fresh or refrigerated horseradish. Lemon juice.  Fats and Oils Reduced-sodium salad dressings. Unsalted butter.  Other Unsalted popcorn and pretzels.  The items listed above may not be a complete list of recommended foods or beverages. Contact your dietitian for more options. WHAT FOODS ARE NOT RECOMMENDED? Grains Instant hot cereals. Bread stuffing, pancake, and biscuit mixes. Croutons. Seasoned rice or pasta mixes. Noodle soup cups. Boxed or frozen macaroni and cheese. Self-rising flour. Regular salted crackers. Vegetables Regular canned vegetables. Regular canned tomato sauce and paste. Regular tomato and vegetable juices. Frozen vegetables in sauces. Salted [place] fries. Olives. Angie Fava. Relishes. Sauerkraut. Salsa. Meat and Other Protein Products Salted, canned, smoked, spiced, or pickled meats, seafood, or fish. Bacon, ham, sausage, hot dogs, corned beef, chipped beef, and packaged luncheon meats. Salt pork. Jerky.  Pickled herring. Anchovies, regular canned tuna, and sardines. Salted nuts. Dairy Processed cheese and cheese spreads. Cheese curds. Blue cheese and cottage cheese. Buttermilk.  Condiments Onion and garlic salt, seasoned salt, table salt, and sea salt. Canned and packaged gravies. Worcestershire sauce. Tartar sauce. Barbecue sauce. Teriyaki sauce. Soy sauce, including reduced sodium. Steak sauce. Fish sauce. Oyster sauce. Cocktail sauce.  Horseradish that you find on the shelf. Regular ketchup and mustard. Meat flavorings and tenderizers. Bouillon cubes. Hot sauce. Tabasco sauce. Marinades. Taco seasonings. Relishes. Fats and Oils Regular salad dressings. Salted butter. Margarine. Ghee. Bacon fat.  Other Potato and tortilla chips. Corn chips and puffs. Salted popcorn and pretzels. Canned or dried soups. Pizza. Frozen entrees and pot pies.  The items listed above may not be a complete list of foods and beverages to avoid. Contact your dietitian for more information.   This information is not intended to replace advice given to you by your health care provider. Make sure you discuss any questions you have with your health care provider.   Document Released: 11/17/2001 Document Revised: 06/18/2014 Document Reviewed: 04/01/2013 Elsevier Interactive Patient Education 2016 [place] physician has requested that you regularly monitor and record your blood pressure readings and heart rate at home. Please use the same machine at the same time of day to check your readings and record them to bring to your follow-up visit.

## 2015-06-28 NOTE — Assessment & Plan Note (Signed)
Patient recently had a hospitalization with hypertensive emergency with chest pain and positive troponins. Blood pressure medications were adjusted and blood pressure is on the low side now. He has a cuff at home and I've asked him to keep track of his blood pressure and bring them back. He is watching his sodium intake closely.

## 2015-06-28 NOTE — Assessment & Plan Note (Signed)
EF 35% on recent nuclear stress test. Recommend 2-D echo to further verify. Follow-up with Dr. [name] in 2 months.

## 2015-06-28 NOTE — Progress Notes (Signed)
Cardiology Office Note   Date:  06/28/2015   ID:  [name] [name], DOB 1955/11/20, MRN [id number]  PCP:  Cathlean Cower, MD  Cardiologist: Dr. [name] Dr. Turner:for OSA  Chief Complaint: Fatigue    History of Present Illness: [name] [name] is a 60 y.o. male who presents for this hospital follow-up. He was admitted 12/26 through 12/29 with chest pain, positive troponins in the setting of a hypertensive emergency. He had stopped his Plavix 3 days prior to admission in anticipation of spinal injections for chronic low back pain.  Patient had nuclear stress test 06/16/15 EF 35% no ST segment deviation during stress, was an intermediate study with a large severe predominantly fixed inferior lateral defect consistent with prior infarct no significant ischemia.  Patient has history of CAD status post PCI to the RCA and PL 1, cutting balloon angioplasty secondary to in-stent restenosis of the RCA in 2008, ischemic cardiomyopathy with previous EF of 40%. He had a STEMI 08/2012 treated with DES to the LAD and had diffuse residual disease treated medically. EF was 35-40% with follow-up EF 50-55%. He had a NSTEMI treated with DES to the OM1, NSTEMI 03/2014 with LHC demonstrating severe disease in the small distal circumflex and OM 2, moderate disease in the mid RCA and distal LAD treated medically. Beta blocker decreased secondary to bradycardia. He was admitted with chest pain 02/2015 d-dimer negative troponins normal and echo showed normal LV function. Pressure was treated with increasing Coreg to 6.25 twice a day and spironolactone 25 mg daily.  Patient comes in today overall feeling better. His main complaint is being tired and fatigue. His blood pressure is on the low side today. He has not taken his blood pressure at home. He is watching his sodium closely but says food doesn't taste good anymore. He denies any further chest pain, palpitations, shortness of breath or edema. He continues to smoke less than 1 pack  per day.    Past Medical History  Diagnosis Date  . HTN (hypertension)   . CAD (coronary artery disease)     a. s/p PCI/BMS pRCA 2002; b. PCI pRCA & DES p/m RCA 2006; c. PCI/DES OM4 2007; d. PCI/CBA to RCA for ISR 10/2006; e.08/2012 STEMI/Cath/PCI: LM nl, LAD95-62m (3.0x20 Promus Prem DES), 95/95 ap LAD (1.0-1.81mm), LCX 30p 95d (sm), LPL1 70-80p, patent stent, LPL2 nl, RCA 30p, 40/70 ISR, EF 35-40%.  . Ischemic cardiomyopathy     a. EF 40%; improved to normal;  b. 08/2012 Echo: EF 50-55%, mod LVH.  [name] GERD (gastroesophageal reflux disease)   . Tobacco abuse   . Obesity   . OSA (obstructive sleep apnea)     Does not use CPAP as of 05/2011  . Depression   . History of pneumonia   . Diabetes mellitus ([place])     a. A1c 8.8 08/2012->Metformin initiated. => b. A1c (9/14): 6.6  . Hyperlipidemia   . Benign essential HTN 03/09/2015    Past Surgical History  Procedure Laterality Date  . Coronary stent placement  2009  . Coronary angioplasty with stent placement    . Left heart catheterization with coronary angiogram N/A 08/31/2012    Procedure: LEFT HEART CATHETERIZATION WITH CORONARY ANGIOGRAM;  Surgeon: Burnell Blanks, MD;  Location: [facility] CATH LAB;  Service: Cardiovascular;  Laterality: N/A;  . Percutaneous coronary stent intervention (pci-s) Right 08/31/2012    Procedure: PERCUTANEOUS CORONARY STENT INTERVENTION (PCI-S);  Surgeon: Burnell Blanks, MD;  Location: [facility] CATH LAB;  Service: Cardiovascular;  Laterality:  Right;  . Left heart catheterization with coronary angiogram N/A 11/16/2013    Procedure: LEFT HEART CATHETERIZATION WITH CORONARY ANGIOGRAM;  Surgeon: Blane Ohara, MD;  Location: [facility] CATH LAB;  Service: Cardiovascular;  Laterality: N/A;  . Percutaneous coronary stent intervention (pci-s)  11/16/2013    Procedure: PERCUTANEOUS CORONARY STENT INTERVENTION (PCI-S);  Surgeon: Blane Ohara, MD;  Location: [facility] CATH LAB;  Service: Cardiovascular;;  . Left heart catheterization  with coronary angiogram N/A 03/15/2014    Procedure: LEFT HEART CATHETERIZATION WITH CORONARY ANGIOGRAM;  Surgeon: Peter M Martinique, MD;  Location: [facility] CATH LAB;  Service: Cardiovascular;  Laterality: N/A;     Current Outpatient Prescriptions  Medication Sig Dispense Refill  . amLODipine (NORVASC) 10 MG tablet TAKE 1 TABLET BY MOUTH EVERY DAY 90 tablet 3  . aspirin 81 MG tablet Take 81 mg by mouth daily.    . carvedilol (COREG) 6.25 MG tablet Take 1 tablet (6.25 mg total) by mouth 2 (two) times daily with a meal. 60 tablet 6  . cloNIDine (CATAPRES) 0.3 MG tablet Take 1 tablet (0.3 mg total) by mouth 2 (two) times daily. 60 tablet 11  . clopidogrel (PLAVIX) 75 MG tablet Take 1 tablet (75 mg total) by mouth daily. 30 tablet 6  . escitalopram (LEXAPRO) 10 MG tablet TAKE 1 TABLET BY MOUTH ONCE DAILY 90 tablet 2  . famotidine (PEPCID) 40 MG tablet Take 1 tablet (40 mg total) by mouth every 12 (twelve) hours. 60 tablet 10  . furosemide (LASIX) 80 MG tablet Take 80 mg by mouth 2 (two) times daily.  5  . gabapentin (NEURONTIN) 300 MG capsule TAKE 1 CAPSULE(300 MG) BY MOUTH AT BEDTIME 90 capsule 1  . isosorbide mononitrate (IMDUR) 30 MG 24 hr tablet Take 3 tablets (90 mg total) by mouth daily. 120 tablet 6  . losartan (COZAAR) 100 MG tablet Take 1 tablet (100 mg total) by mouth daily. 90 tablet 3  . metFORMIN (GLUCOPHAGE) 500 MG tablet Take by mouth 2 (two) times daily with a meal.    . Multiple Vitamins-Minerals (MULTIVITAMIN WITH MINERALS) tablet Take 1 tablet by mouth daily. CENTRUM SILVER MENS  50 PLUS    . nitroGLYCERIN (NITROSTAT) 0.4 MG SL tablet Place 1 tablet (0.4 mg total) under the tongue every 5 (five) minutes as needed for chest pain. For chest pain 25 tablet 2  . potassium chloride SA (K-DUR,KLOR-CON) 20 MEQ tablet Take 1 tablet (20 mEq total) by mouth 2 (two) times daily. 180 tablet 5  . rosuvastatin (CRESTOR) 40 MG tablet Take 1 tablet (40 mg total) by mouth daily. 90 tablet 3  .  spironolactone (ALDACTONE) 25 MG tablet Take 1 tablet (25 mg total) by mouth daily. 30 tablet 6  . traMADol (ULTRAM) 50 MG tablet Take 1 tablet (50 mg total) by mouth every 6 (six) hours as needed. 120 tablet 2   No current facility-administered medications for this visit.    Allergies:   Penicillins    Social History:  The patient  reports that he has been smoking Cigarettes.  He has a 15 pack-year smoking history. He has never used smokeless tobacco. He reports that he does not drink alcohol or use illicit drugs.   Family History:  The patient's    family history includes Cancer in his mother; Depression in his mother; Hypertension in his mother; Other in his father. There is no history of Heart attack or Stroke.    ROS:  Please see the history of present  illness.   Otherwise, review of systems are positive for none.   All other systems are reviewed and negative.    PHYSICAL EXAM: VS:  BP 100/62 mmHg  Pulse 60  Ht 6\' 1"  (1.854 m)  Wt 345 lb 12.8 oz (156.854 kg)  BMI 45.63 kg/m2  SpO2 93% , BMI Body mass index is 45.63 kg/(m^2). GEN: Obese, in no acute distress Neck: no JVD, HJR, carotid bruits, or masses Cardiac: RRR; no murmurs,gallop, rubs, thrill or heave,  Respiratory:  clear to auscultation bilaterally, normal work of breathing GI: soft, nontender, nondistended, + BS MS: no deformity or atrophy Extremities: Trace of edema in the ankles otherwise without cyanosis, clubbing, good distal pulses bilaterally.  Skin: warm and dry, no rash Neuro:  Strength and sensation are intact    EKG:  EKG is not ordered today.    Recent Labs: 07/16/2014: Pro B Natriuretic peptide (BNP) 73.0 02/11/2015: Magnesium 2.1 06/27/2015: ALT 22; BUN 21; Creatinine, Ser 1.35; Hemoglobin 14.7; Platelets 244.0; Potassium 4.5; Sodium 143; TSH 1.31    Lipid Panel    Component Value Date/Time   CHOL 113 06/27/2015 1005   TRIG 113.0 06/27/2015 1005   HDL 36.20* 06/27/2015 1005   CHOLHDL 3  06/27/2015 1005   VLDL 22.6 06/27/2015 1005   LDLCALC 54 06/27/2015 1005      Wt Readings from Last 3 Encounters:  06/28/15 345 lb 12.8 oz (156.854 kg)  06/27/15 343 lb (155.584 kg)  06/15/15 338 lb (153.316 kg)      Other studies Reviewed: Additional studies/ records that were reviewed today include and review of the records demonstrates: Nuclear stress test 06/16/15 Study Highlights      Nuclear stress EF: 35%.  There was no ST segment deviation noted during stress.  This is an intermediate risk study.  Findings consistent with prior myocardial infarction.  The left ventricular ejection fraction is moderately decreased (30-44%).   Intermediate risk stress nuclear study with a large, severe, predominantly fixed inferior lateral defect consistent with prior infarct; no significant ischemia; EF 35 but visually appears better; akinesis of the basal and mid inferior lateral wall; mild LVE; suggest echo to better assess LV function. Study intermediate risk due to reduced LV function.      Coronary angiography: Coronary dominance: right  Left mainstem: Normal.  Left anterior descending (LAD): The proximal LAD is ectactic. There is a stent in the mid vessel that is widely patent. The very distal LAD is diffusely diseased up to 70% at the apex.    Left circumflex (LCx): The LCx has diffuse ecstasia in the proximal and mid vessel. The first OM is small and chronically occluded. The second OM is widely patent at prior stent sites. There is severe disease in the distal OM2 up to 90%.  OM 3 has mild disease to 30-40%. The distal LCx is a very small branch with 90% stenosis.  Right coronary artery (RCA): The RCA has a long stented segment in the proximal to mid vessel. There is diffuse in stent disease in the mid RCA up to 60-70%.    Left ventriculography: Left ventricular systolic function is abnormal, there is inferobasal akinesis. LVEF is estimated at 50%, there is no significant  mitral regurgitation   Final Conclusions:   1. Diffuse CAD. There is severe disease in the small distal LCx and OM2. There is moderate disease in the mid RCA and distal LAD. This has not changed significantly since his prior study. 2. Mild LV dysfunction.  Recommendations: Continue medical therapy with risk factor modification.  Peter Martinique, [place] 03/15/2014, 4:23 PM              2-D echo 9/2/16Study Conclusions  - Left ventricle: Severe hypokinesis base inferior segment. The   cavity size was normal. Wall thickness was increased in a pattern   of mild LVH. The estimated ejection fraction was 55%. - Left atrium: The atrium was mildly dilated. - Right ventricle: The cavity size was normal. Systolic function   was normal. - Right atrium: The atrium was mildly dilated. - Pulmonary arteries: PA peak pressure: 35 mm Hg (S).      ASSESSMENT AND PLAN:  Benign essential HTN Patient recently had a hospitalization with hypertensive emergency with chest pain and positive troponins. Blood pressure medications were adjusted and blood pressure is on the low side now. He has a cuff at home and I've asked him to keep track of his blood pressure and bring them back. He is watching his sodium intake closely.  Ischemic cardiomyopathy- EF 50-55% June 2015 EF 35% on recent nuclear stress test. Recommend 2-D echo to further verify. Follow-up with Dr. [name] in 2 months.  Coronary Artery Disease Last cath in 03/2014 showed diffuse CAD with severe disease in the small distal circumflex and OM 2 and moderate disease in the mid RCA and distal LAD. Recent nuclear stress test was without ischemia. No chest pain. Continue medical therapy.  Tobacco abuse Patient continues to smoke less than a pack of cigarettes daily. Had discussion with him about the importance of smoking cessation. He says he has the nicotine gum at home. He will consider the nicotine patch.    Sumner Boast, PA-C   06/28/2015 10:56 AM    [facility] [place], [place], [place]  [address] Phone: [phone]; Fax: [phone]

## 2015-06-29 ENCOUNTER — Other Ambulatory Visit: Payer: Self-pay | Admitting: Internal Medicine

## 2015-07-03 ENCOUNTER — Other Ambulatory Visit: Payer: Self-pay | Admitting: Physician Assistant

## 2015-07-06 ENCOUNTER — Telehealth: Payer: Self-pay | Admitting: Cardiovascular Disease

## 2015-07-06 NOTE — Telephone Encounter (Signed)
New message    Pt C/O swelling: STAT is pt has developed SOB within 24 hours  1. How long have you been experiencing swelling? X 2 days   2. Where is the swelling located? Both / more the foot   3.  Are you currently taking a "fluid pill"? Yes 80 mg twice a day    4.  Are you currently SOB? When moving around   5.  Have you traveled recently? No

## 2015-07-06 NOTE — Telephone Encounter (Addendum)
Patient st his feet have been "puffy" for 2 days. His shoes still fit. He blamed the swelling on going to a restaurant and eating baked chicken that was surprisingly salty.  Today, he started noticing mild SOB on exertion. He has no CP.  His BP monitor is broken and so is his scale. He has taken his medications as directed. Instructed patient to limit salt and elevate feet while sitting.  Reviewed with Lyda Jester, PA. Patient to take an extra half tablet of lasix in the AM if he is not feeling better. He understands to call back if symptoms have not improved after a couple days.

## 2015-07-07 ENCOUNTER — Other Ambulatory Visit: Payer: Self-pay

## 2015-07-07 ENCOUNTER — Ambulatory Visit (HOSPITAL_COMMUNITY): Payer: Commercial Managed Care - HMO | Attending: Cardiovascular Disease

## 2015-07-07 DIAGNOSIS — I517 Cardiomegaly: Secondary | ICD-10-CM | POA: Diagnosis not present

## 2015-07-07 DIAGNOSIS — E119 Type 2 diabetes mellitus without complications: Secondary | ICD-10-CM | POA: Diagnosis not present

## 2015-07-07 DIAGNOSIS — I10 Essential (primary) hypertension: Secondary | ICD-10-CM | POA: Insufficient documentation

## 2015-07-07 DIAGNOSIS — F17200 Nicotine dependence, unspecified, uncomplicated: Secondary | ICD-10-CM | POA: Diagnosis not present

## 2015-07-07 DIAGNOSIS — I519 Heart disease, unspecified: Secondary | ICD-10-CM | POA: Diagnosis not present

## 2015-07-07 DIAGNOSIS — R29898 Other symptoms and signs involving the musculoskeletal system: Secondary | ICD-10-CM | POA: Insufficient documentation

## 2015-07-07 DIAGNOSIS — E785 Hyperlipidemia, unspecified: Secondary | ICD-10-CM | POA: Diagnosis not present

## 2015-07-07 DIAGNOSIS — Z8249 Family history of ischemic heart disease and other diseases of the circulatory system: Secondary | ICD-10-CM | POA: Insufficient documentation

## 2015-07-11 ENCOUNTER — Encounter (HOSPITAL_BASED_OUTPATIENT_CLINIC_OR_DEPARTMENT_OTHER): Payer: Commercial Managed Care - HMO

## 2015-07-12 NOTE — Telephone Encounter (Signed)
Pt never picked up Oxycodone script written on 05/25/15. Voiding rx.[name]

## 2015-07-25 DIAGNOSIS — G4733 Obstructive sleep apnea (adult) (pediatric): Secondary | ICD-10-CM | POA: Diagnosis not present

## 2015-08-01 ENCOUNTER — Other Ambulatory Visit: Payer: Self-pay

## 2015-08-01 NOTE — Patient Outreach (Signed)
[facility]) Care Management  08/01/2015  Vedh Brito Sep 18, 1955 [id number]   Referral Date:  07/25/2015 Source:  [employer] Tier 4 Issue:  DM, CHF with 1 ED and 1 Admission  H/o PCP:  Dr. Biagio Borg H/o Admissions: 2 and ED: 1 over the past 6 months.   Outreach call #1 to patient.  Patient not reached.  RN CM left HIPAA compliant voice message with name and #. RN CM scheduled for next outreach call within 1 week.   Mariann Laster, RN, BSN, [facility] CCM  [employer] Management Coordinator [phone] Direct [phone] Cell [phone] Office [phone] Fax

## 2015-08-02 ENCOUNTER — Other Ambulatory Visit: Payer: Self-pay

## 2015-08-02 DIAGNOSIS — I5032 Chronic diastolic (congestive) heart failure: Secondary | ICD-10-CM

## 2015-08-02 NOTE — Patient Outreach (Signed)
[facility]) Care Management  08/02/2015  Deray Kimbro 1955-09-06 [id number]  Telephonic Screening   Providers: Primary MD: Dr. Cathlean Cower  -  last appt:  06/27/2015 -   next appt:  3 months Cardiologist:  Dr. Sherren Mocha Lyndal Pulley, [place], [phone] HH:  None  Insurance:  [place], [place] "Extra Help",  [facility] Medicaid [place] Access ID # [id number] N  [phone])  (Patient has not received a new card and is not sure if he is still active with Medicaid).   Social: Lives in his home alone.   Mobility: Ambulating with a cane and / or walker as needed due to current back pain issues and shortness.  Falls: 2 no injury.  Pain: yes; Lower back pain and down left leg but states damage is in the right leg per MRI results.  Transportation:  Self but car issues right now and a friend providing assistance $10.00 round trip but cab costing $10.00 one way.  Caregiver: x-room mate:  Samuel [place] -  Advanced Directive: None  SSD:  1,000.00 / month DME:  Cane, walker, CBG meter, home scales (needs a battery), BP cuff, CPap (06/2015) [facility],  eyeglasses.   THN conditions:  DM, CHF Other:  C/O Back out for about 2 months.  Refusing surgery and taking pain medication and h/o past epidural injections.  Pain management referral pending but states he has not heard any updates and not sure where things are with the referral process.  Admissions:  2    Last admission:  06/06/2015 - 06/09/2015 - Hypertensive emergency PMH:  Tobacco abuse, Obesity, Class III, BMI 40-49.9 (morbid obesity), DM2, Coronary Artery Disease, Hyperlipidemia, OSA (obstructive sleep apnea), Benign essential HTN ER visits: 1 DM BS checks 3-4 times a day.   BS readings 81-152 A1C: 8.0  Weight:  346 - patient not weighing daily.  States scales need a battery.   Medications:  Patient taking about 10 medications  Co-pay cost issues:  None  Flu Vaccine: 02/12/2015  Pneumonia  Vaccine: 2015 and 2017  Plan:  [place] RN CM Referral  - RN CM will follow up on Pain Management Referral   [place].  -[employer] Transportation Application needed.  Scientist, research (life sciences) information needed.  -Medicaid Transportation Resource information (if active status). -F/u on Medicaid status:  If not active; make a referral for new medicaid application.     RN CM notified [place] Management Assistant: agreed to services/case opened. RN CM sent successful outreach letter and  [facility] Introductory package. RN CM advised in next contact call within the next 2-3 weeks for initial assessment and care coordination services as needed.  RN CM advised to please notify MD of any changes in condition prior to scheduled appt's.   RN CM provided contact name and # [phone] or main office # [phone] and 24-hour nurse line # [phone].  RN CM confirmed patient is aware of 911 services for urgent emergency needs.  Mariann Laster, RN, BSN, [facility], CCM  [employer] Management Coordinator [phone] Direct [phone] Cell [phone] Office [phone] Fax

## 2015-08-09 NOTE — Patient Outreach (Signed)
[facility]) Care Management  08/09/2015  [name] [name] [birth year] [id number]   Request from Mariann Laster, RN to send patient a list of transportation resources for his county. Packet was sent 08/09/2015.  Morissa Obeirne L. Lyndle Pang, [place] Care Management Assistant

## 2015-08-09 NOTE — Addendum Note (Signed)
Addended by: Standley Brooking on: 08/09/2015 05:24 PM   Modules accepted: Orders

## 2015-08-09 NOTE — Patient Outreach (Signed)
[facility]) Care Management  08/09/2015  Dennise Duffin June 30, 1955 [id number]   [facility] SW referral sent  -[employer] Transportation Application needed.  Scientist, research (life sciences) information needed (provided by [facility] care Psychologist, prison and probation services).   -Medicaid Transportation Resource information (if active status). -F/u on Medicaid status: If not active; make a referral for new medicaid application.   Mariann Laster, RN, BSN, [facility], CCM  [employer] Management Coordinator [phone] Direct [phone] Cell [phone] Office [phone] Fax

## 2015-08-11 ENCOUNTER — Telehealth: Payer: Self-pay | Admitting: Cardiovascular Disease

## 2015-08-11 NOTE — Telephone Encounter (Signed)
New message     Pt c/o Shortness Of Breath: STAT if SOB developed within the last 24 hours or pt is noticeably SOB on the phone  1. Are you currently SOB (can you hear that pt is SOB on the phone)? no 2. How long have you been experiencing SOB? About 1 week but it seems to be getting worse 3. Are you SOB when sitting or when up moving around? Only when he moves around 4. Are you currently experiencing any other symptoms? Fatigue No ankle/feet swelling, bp this am 117/83 HR 54

## 2015-08-11 NOTE — Telephone Encounter (Signed)
Patient had an episode where he became SOB walking down his long hallway from kitchen to living room. He had to rest a little bit for it to ease up.  It felt worse today than normal, usually comes in the mornings, while at rest.  He has no swelling; no other SOB today.  Feels "good" now. His scale does not work, has not been weighing himself. Has eaten higher sodium foods last couple days.    Has no CP, and is aware that if SOB returns and persists or is accompanied by chest pain he should go to the ED for evaluation. He continues to smoke cigarettes.  Pt is aware I am routing message to Dr. [name] for any further recommendations and will call him back with any updates. Pt has sleep study on 3/7, appointment to see Dr. [name] in April.

## 2015-08-12 ENCOUNTER — Other Ambulatory Visit: Payer: Self-pay | Admitting: Family Medicine

## 2015-08-12 NOTE — Telephone Encounter (Signed)
No other recommendations. We can work him sooner if symptoms worsen but agree with plan for now. He needs to avoid salt and stop smoking, otherwise symptoms will continue to progress.

## 2015-08-16 ENCOUNTER — Other Ambulatory Visit: Payer: Self-pay

## 2015-08-16 ENCOUNTER — Ambulatory Visit (HOSPITAL_BASED_OUTPATIENT_CLINIC_OR_DEPARTMENT_OTHER): Payer: Commercial Managed Care - HMO | Attending: Cardiology

## 2015-08-16 DIAGNOSIS — R0683 Snoring: Secondary | ICD-10-CM | POA: Insufficient documentation

## 2015-08-16 DIAGNOSIS — Z79899 Other long term (current) drug therapy: Secondary | ICD-10-CM | POA: Diagnosis not present

## 2015-08-16 DIAGNOSIS — G4733 Obstructive sleep apnea (adult) (pediatric): Secondary | ICD-10-CM | POA: Diagnosis not present

## 2015-08-16 DIAGNOSIS — Z7984 Long term (current) use of oral hypoglycemic drugs: Secondary | ICD-10-CM | POA: Insufficient documentation

## 2015-08-16 DIAGNOSIS — I493 Ventricular premature depolarization: Secondary | ICD-10-CM | POA: Insufficient documentation

## 2015-08-16 DIAGNOSIS — Z7902 Long term (current) use of antithrombotics/antiplatelets: Secondary | ICD-10-CM | POA: Insufficient documentation

## 2015-08-16 NOTE — Patient Outreach (Signed)
[facility]) Care Management  08/16/2015  [name] [name] 02-12-1956 [id number]   Outreach call #1 to patient for Initial Assessment.  Patient not reached.  RN CM left HIPAA compliant voice message with name and number for call back.  RN CM scheduled for next contact call within one week.   Mariann Laster, RN, BSN, [facility] CCM  [employer] Management Coordinator [phone] Direct [phone] Cell [phone] Office [phone] Fax

## 2015-08-16 NOTE — Progress Notes (Signed)
This encounter was created in error - please disregard.

## 2015-08-18 ENCOUNTER — Other Ambulatory Visit: Payer: Self-pay

## 2015-08-18 VITALS — BP 117/46 | Ht 73.0 in | Wt 346.0 lb

## 2015-08-18 DIAGNOSIS — E1110 Type 2 diabetes mellitus with ketoacidosis without coma: Secondary | ICD-10-CM

## 2015-08-18 NOTE — Patient Outreach (Signed)
[facility]) Care Management  08/18/2015  [name] [name] 18-Jun-1955 [id number]   Initial Assessment   Providers: Primary MD: Dr. Cathlean Cower - last appt: 06/27/2015 - next appt: 3 months Cardiologist: Dr. Sherren Mocha  - Next appt 09/12/2015 Lyndal Pulley, [facility], [phone] HH: None  Insurance: [place], [place] "Extra Help", [facility] Medicaid [place] Access ID # [id number] N [phone]) (Patient has not received a new card and is not sure if he is still active with Medicaid).   Social: Lives in his home alone.  Mobility: Ambulating with a cane and / or walker as needed due to current back pain issues and shortness.  Falls: 2 no injury.  Pain: yes; Lower back pain and down left leg but states damage is in the right leg per MRI results.  Transportation: Self but car issues right now and a friend providing assistance $10.00 round trip but cab costing $10.00 one way.  [facility] mailed transportation resources but patient states he needs to go through his mail and unable to confirm if he received.  Caregiver: x-room mate: Samuel Germany  Advanced Directive: None.  Patient agreed to mailing of Advanced Directive Document.   Patient states he has never thought about a code status and is not ready to discuss this call.   SSD: 1,000.00 / month DME: Cane, walker, CBG meter, home scales (needs a battery) (Humana Scale), BP cuff, CPap (06/2015 [facility]), eyeglasses.   THN conditions: DM, CHF Other: C/O Back out for about 2 - 3 months. Refusing surgery and taking pain medication if needed. H/o past epidural injections. Pain management referral pending but states he has not heard any updates and not sure where things are with the referral process.  Admissions: 1 and ER visits: 1 over the past 6 months.  Last admission: 06/06/2015 - 06/09/2015 - Hypertensive emergency PMH: Tobacco abuse, Obesity, Class III, BMI 40-49.9 (morbid obesity),  DM2, Coronary Artery Disease, Hyperlipidemia, OSA (obstructive sleep apnea), Benign essential HTN DM BS checks 3-4 times a day. BS readings 81-152 A1C: 8.0  Weight: 346 - patient not weighing daily. States scales need a battery and unable to get the back off to replace.  States he has a friend who is going to help.   Medications:  Co-pay cost issues: None  Flu Vaccine: 02/12/2015  Pneumonia Vaccine: 2015 and 2017  Plan:  Referral Date:  07/25/2015 Screening:  08/02/2015 Telephonic RN CM services 08/02/2015 - 08/18/2015  [facility]  Community RN CM Referral  - Patient not weighing:  Issues with scales and replacing battery.  H/o patient not weighing on last Episode of THN services over the past year.  - Patient admits to needing DM education, diet instructions and improvement in BS readings.  - A1c 8.0 - Pain Management Referral - progress unknown by patient.  - Advanced Directive mailed to patient on 08/18/2015 for patient to review and start education process.   [facility] Administrative Assistant Referral sent 08/02/2015 (list of transportation resources for his county mailed out 08/09/2015 by [facility]).   -SCAT Transportation Application needed.  Scientist, research (life sciences) information needed.  -Medicaid Transportation Resource information (if active status). -F/u on Medicaid status: If not active; make a referral for new medicaid application.    RN CM advised in next contact call within the next 10 business days.  RN CM advised to please notify MD of any changes in condition prior to scheduled appt's.  RN CM provided contact name and # [phone] or main  office # [phone] and 24-hour nurse line # [phone].  RN CM confirmed patient is aware of 911 services for urgent emergency needs.  Mariann Laster, RN, BSN, [facility], CCM  [employer] Management Coordinator [phone] Direct [phone] Cell [phone] Office [phone] Fax

## 2015-08-19 NOTE — Patient Outreach (Addendum)
[facility]) Care Management  [place]  08/19/2015   Dearon Dema 1956-03-24 [id number]  Addendum note to 08/18/2015 assessment.    Current Medications:  Current Outpatient Prescriptions  Medication Sig Dispense Refill  . amLODipine (NORVASC) 10 MG tablet TAKE 1 TABLET BY MOUTH EVERY DAY 90 tablet 3  . aspirin 81 MG tablet Take 81 mg by mouth daily.    . carvedilol (COREG) 6.25 MG tablet Take 1 tablet (6.25 mg total) by mouth 2 (two) times daily with a meal. 60 tablet 6  . cloNIDine (CATAPRES) 0.3 MG tablet Take 1 tablet (0.3 mg total) by mouth 2 (two) times daily. 60 tablet 11  . clopidogrel (PLAVIX) 75 MG tablet Take 1 tablet (75 mg total) by mouth daily. 30 tablet 6  . escitalopram (LEXAPRO) 10 MG tablet TAKE 1 TABLET BY MOUTH EVERY DAY 90 tablet 1  . famotidine (PEPCID) 40 MG tablet Take 1 tablet (40 mg total) by mouth every 12 (twelve) hours. 60 tablet 10  . furosemide (LASIX) 80 MG tablet Take 80 mg by mouth 2 (two) times daily.  5  . gabapentin (NEURONTIN) 300 MG capsule TAKE 1 CAPSULE(300 MG) BY MOUTH AT BEDTIME 90 capsule 1  . gabapentin (NEURONTIN) 300 MG capsule TAKE 1 CAPSULE(300 MG) BY MOUTH AT BEDTIME 30 capsule 3  . isosorbide mononitrate (IMDUR) 30 MG 24 hr tablet Take 3 tablets (90 mg total) by mouth daily. 120 tablet 6  . losartan (COZAAR) 100 MG tablet Take 1 tablet (100 mg total) by mouth daily. 90 tablet 3  . metFORMIN (GLUCOPHAGE) 500 MG tablet Take by mouth 2 (two) times daily with a meal.    . Multiple Vitamins-Minerals (MULTIVITAMIN WITH MINERALS) tablet Take 1 tablet by mouth daily. CENTRUM SILVER MENS  50 PLUS    . nitroGLYCERIN (NITROSTAT) 0.4 MG SL tablet Place 1 tablet (0.4 mg total) under the tongue every 5 (five) minutes as needed for chest pain. For chest pain 25 tablet 2  . nitroGLYCERIN (NITROSTAT) 0.4 MG SL tablet PLACE 1 TABLET UNDER THE TONGUE EVERY 5 MINUTES AS NEEDED FOR CHEST PAIN 25 tablet 5  . potassium chloride SA  (K-DUR,KLOR-CON) 20 MEQ tablet Take 1 tablet (20 mEq total) by mouth 2 (two) times daily. 180 tablet 5  . rosuvastatin (CRESTOR) 40 MG tablet Take 1 tablet (40 mg total) by mouth daily. 90 tablet 3  . spironolactone (ALDACTONE) 25 MG tablet Take 1 tablet (25 mg total) by mouth daily. 30 tablet 6  . traMADol (ULTRAM) 50 MG tablet Take 1 tablet (50 mg total) by mouth every 6 (six) hours as needed. 120 tablet 2   No current facility-administered medications for this visit.    Functional Status:  In your present state of health, do you have any difficulty performing the following activities: 08/18/2015 06/06/2015  Hearing? N N  Vision? N N  Difficulty concentrating or making decisions? N N  Walking or climbing stairs? Y Y  Dressing or bathing? N N  Doing errands, shopping? N N  Preparing Food and eating ? N -  Using the Toilet? N -  In the past six months, have you accidently leaked urine? N -  Do you have problems with loss of bowel control? N -  Managing your Medications? N -  Managing your Finances? N -  Housekeeping or managing your Housekeeping? N -    Fall/Depression Screening: PHQ 2/9 Scores 08/18/2015 08/02/2015 10/11/2014 10/13/2013 06/26/2013  PHQ - 2 Score 0 0  5 1 1   PHQ- 9 Score - - 18 - -    Fall Risk  08/18/2015 10/13/2013 06/26/2013  Falls in the past year? Yes No No  Number falls in past yr: 2 or more - -  Injury with Fall? No - -  Risk Factor Category  High Fall Risk - -  Risk for fall due to : History of fall(s);Impaired mobility;Other (Comment) - -  Risk for fall due to (comments): Obesity - -  Follow up Falls evaluation completed;Education provided;Falls prevention discussed - -    Mariann Laster, RN, BSN, [facility], [place] Management Care Management Coordinator [phone] Direct [phone] Cell [phone] Office [phone] Fax

## 2015-08-19 NOTE — Telephone Encounter (Signed)
Left message on machine for pt to contact the office if he is still having symptoms.

## 2015-08-22 ENCOUNTER — Other Ambulatory Visit: Payer: Self-pay | Admitting: *Deleted

## 2015-08-22 ENCOUNTER — Encounter: Payer: Self-pay | Admitting: *Deleted

## 2015-08-22 DIAGNOSIS — G4733 Obstructive sleep apnea (adult) (pediatric): Secondary | ICD-10-CM | POA: Diagnosis not present

## 2015-08-22 NOTE — Patient Outreach (Signed)
[facility]) Care Management  08/22/2015  [name] [name] 1955/07/22 [id number]  CSW received a new referral on patient from Dannielle Huh, [facility] with [place] Management, reporting that patient would benefit from social work services and resources to assist with transportation to and from physician appointments.  CSW sent an In Basket message to Wellington Hampshire, [facility] with [place] Management, requesting that she mail patient a list of transportation resources.  These resources include all of the following:   SCAT Paramedic) Secondary school teacher (Under the [employer], members receive 12 free one-way trips or 6 round trips for medical appointments through a company called [employer].  The distance to the appointment must be within 25 miles or less of their location.  [employer]'s contact number is [phone]. Medicaid Transportation/Medicaid Taxi (Available to Adult Medicaid recipients through the [place]) Patient is also interested in having CSW check the status of his Adult Medicaid application through the [place]. CSW made an initial attempt to try and contact patient today to perform phone assessment, as well as assess and assist with social needs and services, without success.  A HIPAA complaint message was left for patient on voicemail.  CSW is currently awaiting a return call.  Nat Christen, BSW, MSW, LCSW  Licensed Education officer, environmental Health System  Mailing [place] [address], [place], [place] [address] Physical Address-[address], [place], [place] [address] Toll Free Main # [phone] Fax # [phone] Cell # [phone]  Fax # [phone]  [email or web address]   Humana  Discrimination is Against the [employer].  and its subsidiaries comply with applicable Federal civil rights laws and do not discriminate on the basis of race, color, national origin, age, disability, or sex. [place] do not exclude people or treat them differently because of race, color, national origin, age, disability, or sex.    Yahoo. and its subsidiaries provide:  . Free auxiliary aids and services, such as qualified sign language interpreters, video remote interpretation, and written information in other formats to people with disabilities when such auxiliary aids and services are necessary to ensure an equal opportunity to participate. . Free language services to people whose primary language is not English when those services are necessary to provide meaningful access, such as translated documents or oral interpretation.    If you need these services, call [phone] or if you use a TTY, call 711.   If you believe that Yahoo. and its subsidiaries have failed to provide these services or discriminated in another way on the basis of race, color, national origin, age, disability, or sex, you can file a Tourist information centre manager with:   Discrimination [place] [address]   If you need help filing a grievance, call [phone] or if you use a TTY, call 711.  You can also file a civil rights complaint with the U.S. Department of Health and Financial controller, Office for Civil Rights electronically through the Office for Civil Rights Complaint Portal, available at OnSiteLending.nl.jsf, or by mail or phone at:   [place]. Department of Health and Human Services  [address], [place]  Room [phone], [facility] Building  [place], [place]  [phone], [phone] (TDD)  Complaint forms are available at [email or web address]             Humana Multi-Language  Interpreter Services  English: ATTENTION: If you do not speak  English, language assistance services, free of charge, are available to you. Call [phone]  (TTY: [id number]).  Espaol (Spanish): ATENCIN: si habla espaol, tiene a su disposicin servicios gratuitos de asistencia lingstica. Llame al [phone] (TTY: R9478181).  ???? (Chinese): ?????????????????????????????? [phone] (TTY:711??  Ti?ng Vi?t (Vietnamese): CH : N?u b?n ni Ti?ng Vi?t, c cc d?ch v? h? tr? ngn ng? mi?n ph dnh cho b?n. G?i s? [phone] (TTY: [id number]).  ??? ([place]): ?? : ???? ????? ?? , ?? ?? ???? ??? ???? ? ???? . [phone] (TTY: 711)??? ??? ???? .  Tagalog (Tagalog - Filipino): PAUNAWA: Kung nagsasalita ka ng Tagalog, maaari kang gumamit ng mga serbisyo ng tulong sa wika nang walang bayad. [place] sa [phone] (TTY: [id number]).   Reunion): :      ,      .  [phone] (: [id number]).  Kreyl Ayisyen ([place]): ATANSYON: Si w pale Ethelene Hal, gen svis d pou lang ki disponib gratis pou ou. Rele [phone] (TTY: R9478181).  Fonnie Jarvis Marland KitchenPakistan): ATTENTION : Si vous parlez franais, des services d'aide linguistique vous sont proposs gratuitement. Appelez le [phone] (ATS : [id number]).  Polski (Polish): UWAGA: Jeeli mwisz po polsku, moesz skorzysta z bezpatnej pomocy jzykowej. Zadzwo pod numer [phone] (TTY: [id number]).  Portugus ([place]): ATENO: Se fala portugus, encontram-se disponveis servios lingusticos, grtis. Ligue para [phone] (TTY: [id number]).   Italiano ([place]): ATTENZIONE: In caso la lingua parlata sia l'italiano, sono disponibili servizi di assistenza linguistica gratuiti. Chiamare il numero [phone] (TTY: [id number]).  Dawayne Patricia ([place]): ACHTUNG: Wenn Sie Deutsch sprechen, stehen Ihnen kostenlos sprachliche Hilfsdienstleistungen zur [employer]. Rufnummer: [phone] (TTY: [id number]).   (Arabic): [phone]    .            : .)[id number] :   (  ??? ([place]): ??????????????????????????????????[phone] ?TTY?711?????????????????  ? (Farsi): [phone]  . ?   ? ?  ? ? ?~ ?  ?    : .??  (TTY: 711)  Din Bizaad ([place]): D77 baa ak0 n7n7zin: D77 saad bee y1n7[ti'go Risa Grill, saad bee 1k1'1n7da'1wo'd66', t'11 Pricilla Loveless n1 h0l=, koj8' h0d77lnih [phone] (TTY: [id number]).

## 2015-08-23 ENCOUNTER — Encounter: Payer: Self-pay | Admitting: *Deleted

## 2015-08-23 ENCOUNTER — Other Ambulatory Visit: Payer: Self-pay | Admitting: *Deleted

## 2015-08-23 NOTE — Patient Outreach (Signed)
[facility]) Care Management  08/23/2015  [name] [name] 09-18-1955 [id number]   CSW was able to make initial contact with patient today to perform phone assessment, as well as assess and assist with social work needs and services.  CSW introduced self, explained role and types of services provided through [place] Management ([place] Management).  CSW further explained to patient that CSW works with patient's RNCM, also with [place] Management, Dannielle Huh. CSW then explained the reason for the call, indicating that [name] [name] thought that patient would benefit from social work services and resources to assist with transportation resources to get to and from physician appointments. CSW obtained two HIPAA compliant identifiers from patient, which included patient's name and date of birth. Patient admitted that he received the packet of resource information that CSW mailed to his home.  Patient further admitted that he has had an opportunity to review it's contents and that he plans to utilize the [employer] until it runs out.  Patient is aware that he is an Adult Medicaid recipient and that he is also able to utilize his transportation benefit from Medicaid Transportation/Medicaid Taxi, both through the [place].  Patient was not interested in completing his application for SCAT [employer]), but agreed to hold onto it for future reference. CSW inquired as to how CSW could be of further assistance to patient at this time.  Patient denied being able to identify any additional social work needs at present.  CSW provided patient with CSW's contact information, encouraging patient to contact CSW directly if social work needs arise in the near future.  Patient voiced understanding and was agreeable to this plan. CSW will perform a case closure on patient, as all goals of treatment have  been met from social work standpoint and no additional social work needs have been identified at this time. CSW will notify patient's RNCM with [place] Management, Dannielle Huh of CSW's plans to close patient's case. CSW will fax a correspondence letter to patient's Primary Care Physician, [name] [name] to ensure that [name] [name] is aware of CSW's case closure plans.   CSW will submit a case closure request to Lurline Del, Care Management Assistant with [place] Management, in the form of an In [employer].  CSW will ensure that [name] [name] is aware of [name] [name], RNCM with [place] Management, continued involvement with patient's care.  Nat Christen, BSW, MSW, LCSW  Licensed Education officer, environmental Health System  Mailing [place] [address] [place], [place] [address] Physical Address-[address], [place], [place] [address] Toll Free Main # [phone] Fax # [phone] Cell # [phone]  Fax # [phone]  Di [email or web address]  Humana  Discrimination is Against the [employer]. and its subsidiaries comply with applicable Federal civil rights laws and do not discriminate on the basis of race, color, national origin, age, disability, or sex. [place] do not exclude people or treat them differently because of race, color, national origin, age, disability, or sex.    Yahoo. and its subsidiaries provide:  . Free auxiliary aids and services, such as qualified sign language interpreters, video remote interpretation, and written information in other formats to people with disabilities when such auxiliary aids and services are necessary to ensure an equal opportunity to participate. . Free language services to people  whose primary language is not English when those services are necessary to provide meaningful access, such as  translated documents or oral interpretation.    If you need these services, call [phone] or if you use a TTY, call 711.   If you believe that Yahoo. and its subsidiaries have failed to provide these services or discriminated in another way on the basis of race, color, national origin, age, disability, or sex, you [name] file a Tourist information centre manager with:   Discrimination [place] [address]   If you need help filing a grievance, call [phone] or if you use a TTY, call 711.  You [name] also file a civil rights complaint with the U.S. Department of Health and Financial controller, Office for Civil Rights electronically through the Office for Civil Rights Complaint Portal, available at OnSiteLending.nl.jsf, or by mail or phone at:   [place]. Department of Health and Human Services  [place], [place], [facility]  [place], [place]  [phone], [phone] (TDD)  Complaint forms are available at [email or web address]  [place]: ATTENTION: If you do not speak English, language assistance services, free of charge, are available to you. Call [phone]  (TTY: 700).  Espaol (Spanish): ATENCIN: si habla espaol, tiene a su disposicin servicios gratuitos de asistencia lingstica. Llame al [phone] (TTY: 163).  ???? (Chinese): ?????????????????????????????? [phone] (TTY:711??  Ti?ng Vi?t (Vietnamese): CH : N?u b?n ni Ti?ng Vi?t, c cc d?ch v? h? tr? ngn ng? mi?n ph dnh cho b?n. G?i s? [phone] (TTY: 233).  ??? ([place]): ?? : ???? ????? ?? , ?? ?? ???? ??? ???? ? ???? . [phone] (TTY: 711)??? ??? ???? .  Tagalog (Tagalog - Filipino): PAUNAWA: Kung nagsasalita ka ng Tagalog, maaari kang gumamit ng mga serbisyo ng tulong sa wika nang walang bayad. [place] sa [phone] (TTY: 342).   Reunion):  :      ,      .  [phone] (: 035).  Kreyl Ayisyen ([place]): ATANSYON: Si w pale Ethelene Hal, gen svis d pou lang ki disponib gratis pou ou. Rele [phone] (TTY: 646).  Fonnie Jarvis Marland KitchenPakistan): ATTENTION : Si vous parlez franais, des services d'aide linguistique vous sont proposs gratuitement. Appelez le [phone] (ATS : 003).  Polski (Polish): UWAGA: Jeeli mwisz po polsku, moesz skorzysta z bezpatnej pomocy jzykowej. Zadzwo pod numer [phone] (TTY: 503).  Portugus ([place]): ATENO: Se fala portugus, encontram-se disponveis servios lingusticos, grtis. Ligue para [phone] (TTY: 791).   Italiano ([place]): ATTENZIONE: In caso la lingua parlata sia l'italiano, sono disponibili servizi di assistenza linguistica gratuiti. Chiamare il numero [phone] (TTY: 655).  Dawayne Patricia ([place]): ACHTUNG: Wenn Sie Deutsch sprechen, stehen Ihnen kostenlos sprachliche Hilfsdienstleistungen zur [employer]. Rufnummer: [phone] (TTY: 544).   (Arabic): [phone]   .            : .)758 :   (  ??? ([place]): ??????????????????????????????????[phone] ?TTY?711?????????????????  ? (Farsi): [phone]  . ?   ? ?  ? ? ?~ ?  ?    : .??  (TTY: 711)  Din Bizaad ([place]): D77 baa ak0 n7n7zin: D77 saad bee y1n7[ti'go Risa Grill, saad bee 1k1'1n7da'1wo'd66', t'11 Pricilla Loveless n1 h0l=, koj8' h0d77lnih [phone] (TTY: 859).

## 2015-08-24 ENCOUNTER — Other Ambulatory Visit: Payer: Self-pay | Admitting: *Deleted

## 2015-08-24 NOTE — Patient Outreach (Signed)
[facility]) Care Management  08/24/2015  [name] [name] 08-02-1955 [id number]   Assessment: Care coordination call Referral received from Telephonic care management coordinator (C. Hutchinson) for Diabetes management (A1c= 8.0).  Call placed and spoke with patient briefly. Care management coordinator introduced self and explained purpose of the call. Patient's phone seemed malfunctioning and was able to speak with him on and off. Patient requested to be called back due to phone not working well and states "will try to figure out what is going on with this phone".  Plan: Will attempt to call patient back - schedule for next outreach call.  Donalee Gaumond A. Anastasio Wogan, BSN, RN-BC [place] Management Coordinator Cell: [phone]

## 2015-08-25 ENCOUNTER — Telehealth: Payer: Self-pay | Admitting: Cardiology

## 2015-08-25 NOTE — Sleep Study (Signed)
Patient Name: [name] [name], [name] [name] MRN: [id number] Study Date: 08/16/2015 Gender: Male D.O.B: February 24, 1956 Age (years): 49 Referring Provider: Fransico Him MD, ABSM Interpreting Physician: Fransico Him MD, ABSM  Height (inches): 72 Weight (lbs): 316 RPSGT: Madelon Lips BMI: 43 Height (inches): 72 Neck Size: 18.00  CLINICAL INFORMATION Sleep Study Type: Split Night CPAP Indication for sleep study: OSA Epworth Sleepiness Score: 8  SLEEP STUDY TECHNIQUE As per the AASM Manual for the Scoring of Sleep and Associated Events v2.3 (April 2016) with a hypopnea requiring 4% desaturations. The channels recorded and monitored were frontal, central and occipital EEG, electrooculogram (EOG), submentalis EMG (chin), nasal and oral airflow, thoracic and abdominal wall motion, anterior tibialis EMG, snore microphone, electrocardiogram, and pulse oximetry. Continuous positive airway pressure (CPAP) was initiated when the patient met split night criteria and was titrated according to treat sleep-disordered breathing.  MEDICATIONS Medications taken by the patient : AMLODIPINE, ASA, CLONIDINE, PLAVIX, CARVEDILOL, LEXAPRO, FAMOTIDINE, FUROSEMIDE, GABAPENTIN, CRESTOR, TRAMADOL, IMDUR, LOSARTAN, METFORMIN, KDUR. Medications administered by patient during sleep study : No sleep medicine administered.  RESPIRATORY PARAMETERS Diagnostic Total AHI (/hr): 11.7  RDI (/hr):18.3   OA Index (/hr): 1.2 CA Index (/hr): 0.6 REM AHI (/hr): 16.2  NREM AHI (/hr):11.0  Supine AHI (/hr):0.0  Non-supine AHI (/hr):11.73 Min O2 Sat (%):83.00  Mean O2 (%):90.60  Time below 88% (min):16.2    Titration Optimal Pressure (cm):11 AHI at Optimal Pressure (/hr):16.1 Min O2 at Optimal Pressure (%):93.0 Supine % at Optimal (%):100 Sleep % at Optimal (%):90    SLEEP ARCHITECTURE The recording time for the entire night was 382.8 minutes. During a baseline period of 253.8 minutes, the patient slept for 200.4 minutes in REM and  nonREM, yielding a reduced sleep efficiency of 79.0%. Sleep onset after lights out was 17.3 minutes with a prolonged REM latency of 197.0 minutes. The patient spent 12.47% of the night in stage N1 sleep, 74.56% in stage N2 sleep, 0.00% in stage N3 and 12.97% in REM. During the titration period of 123.7 minutes, the patient slept for 112.5 minutes in REM and nonREM, yielding a sleep efficiency of 90.9%. Sleep onset after CPAP initiation was 5.3 minutes with a REM latency of N/A minutes. The patient spent 26.22% of the night in stage N1 sleep, 73.78% in stage N2 sleep, 0.00% in stage N3 and 0.00% in REM.  CARDIAC DATA The 2 lead EKG demonstrated sinus rhythm. The mean heart rate was 56.72 beats per minute. Other EKG findings include: PVCs.  LEG MOVEMENT DATA The total Periodic Limb Movements of Sleep (PLMS) were 262. The PLMS index was 49.75 .  IMPRESSIONS - Mild obstructive sleep apnea occurred during the diagnostic portion of the study (AHI = 11.7 /hour). An optimal PAP pressure was selected for this patient ( 11 cm of water) - No significant central sleep apnea occurred during the diagnostic portion of the study (CAI = 0.6/hour). - The patient had minimal or no oxygen desaturation during the diagnostic portion of the study (Min O2 = 83.00%) - The patient snored with Moderate snoring volume during the diagnostic portion of the study. - EKG findings include PVCs  DIAGNOSIS - Obstructive Sleep Apnea (327.23 [G47.33 ICD-10])  RECOMMENDATIONS - Trial of CPAP therapy on 11 cm H2O with a Large size Resmed Nasal Mask Airfit N20 mask and heated humidification. - Avoid alcohol, sedatives and other CNS depressants that may worsen sleep apnea and disrupt normal sleep architecture. - Sleep hygiene should be reviewed to assess factors that may improve sleep  quality. - Weight management and regular exercise should be initiated or continued. - Return to Sleep Center for re-evaluation after 10 weeks of  therapy    [place], American Board of Sleep Medicine  ELECTRONICALLY SIGNED ON:  08/25/2015, 1:06 PM [place] PH: [phone]   FX: [phone] [place]

## 2015-08-25 NOTE — Telephone Encounter (Signed)
Please let patient know that they have significant sleep apnea and had successful CPAP titration and will be set up with CPAP unit.  Please let DME know that order is in EPIC.  Please set patient up for OV in 10 weeks 

## 2015-08-25 NOTE — Addendum Note (Signed)
Addended by: Fransico Him R on: 08/25/2015 01:11 PM   Modules accepted: Orders

## 2015-08-25 NOTE — Telephone Encounter (Signed)
Left message for patient to call back  

## 2015-08-25 NOTE — Telephone Encounter (Signed)
Patient informed of information. Stated verbal understanding.  He would like to use [facility] for his new DME.  Message sent to them that orders are in the system.   Once patient has started his CPAP, I will schedule 10 week followup

## 2015-08-26 ENCOUNTER — Other Ambulatory Visit: Payer: Self-pay | Admitting: *Deleted

## 2015-08-26 NOTE — Patient Outreach (Signed)
[place] [facility]) Care Management  08/26/2015  [name] [name] June 18, 1955 [id number]   Assessment: Care coordination follow-up call (first attempt) Call placed to speak with patient (as requested on initial contact) but unable to reach him. HIPAA compliant voice message left with name and contact number.  Plan: Will await for a return call. If unable to receive a call back, will schedule for the next outreach call.  Marquitta Persichetti A. Klyde Banka, BSN, RN-BC [place] Management Coordinator Cell: [phone]

## 2015-09-01 ENCOUNTER — Other Ambulatory Visit: Payer: Self-pay | Admitting: *Deleted

## 2015-09-01 NOTE — Patient Outreach (Signed)
[place] [facility]) Care Management  09/01/2015  [name] [name] [birth year] [id number]   Assessment: Care coordination call - second attempt Referral received from Telephonic care management coordinator (C. Hutchinson) for diabetes and heart failure management. Call placed and spoke with patient. Care management coordinator reintroduced self and re-explained purpose of the call.  Patient reports feeling "pretty good" at the moment. He lives alone and independently cares for himself as stated. He mentioned having a friend [name] [name]) whom he calls if he needs assistance and transportation to his appointments. Discussed and reinforced with patient regarding [employer] benefits for transportation to doctors' appointments. Patient encouraged to call ahead to set-up transportation schedule with [facility] in case he needs one.   Medications reviewed with patient and informs care management coordinator about chronic back pain management and use of pain medication that causes his constipation. He states that bowel regimen that he currently have "does not do any good". He also verbalized wanting to know if Lexapro could be increased since "it does not seem to work as it previously did". Encouraged patient to call primary care provider's office to notify of these issues and patient states he plans to call today and set-up an appointment to follow-up with primary care provider to discuss health concerns.  Care management coordinator discussed with patient regarding eating high fiber food like vegetables and fruits; drinking adequate amount of fluids and exercising will help in managing constipation.   Patient reports he has not been weighing self daily since his weighing scale needed battery replacement. He mentioned asking his friend to replace the battery for him. He states that he need to do a better job of checking blood sugar and blood pressure regularly and recording results. He also admitted to  non-compliance with diet restrictions.  Patient had expressed interest in attending diabetes class when mentioned to him. Contact information provided for Nutrition and Diabetes management services at [facility] for him to inquire class schedules he can attend to-- that he could arrange transportation to bring him in for the classes.   Patient denies any other issues or needs at this time. He agreed to home visit in the next 2 weeks.  Encouraged patient to call [facility] management coordinator or 24- hour nurse line if needed. Patient has contact information with him.   Plan: Initial home visit on 09/15/15. Will follow-up battery replacement for weighing scale. Provide [facility] calendar/ notebook to record weight, blood pressure and blood sugar readings. Will follow-up on plan to attend diabetes classes offered. Will follow-up appointment made with primary care provider to discuss health concerns.   Marigny Borre A. Thayer Embleton, BSN, RN-BC [place] Management Coordinator Cell: [phone]

## 2015-09-09 ENCOUNTER — Other Ambulatory Visit: Payer: Self-pay | Admitting: Family Medicine

## 2015-09-09 NOTE — Telephone Encounter (Signed)
Pre visit review using our clinic review tool, if applicable. No additional management support is needed unless otherwise documented below in the visit note. 

## 2015-09-12 ENCOUNTER — Ambulatory Visit (INDEPENDENT_AMBULATORY_CARE_PROVIDER_SITE_OTHER): Payer: Commercial Managed Care - HMO | Admitting: Cardiovascular Disease

## 2015-09-12 ENCOUNTER — Other Ambulatory Visit: Payer: Self-pay | Admitting: *Deleted

## 2015-09-12 ENCOUNTER — Encounter: Payer: Self-pay | Admitting: Cardiovascular Disease

## 2015-09-12 VITALS — BP 110/64 | HR 64 | Ht 73.0 in | Wt 322.0 lb

## 2015-09-12 DIAGNOSIS — I5042 Chronic combined systolic (congestive) and diastolic (congestive) heart failure: Secondary | ICD-10-CM | POA: Diagnosis not present

## 2015-09-12 DIAGNOSIS — I25758 Atherosclerosis of native coronary artery of transplanted heart with other forms of angina pectoris: Secondary | ICD-10-CM

## 2015-09-12 NOTE — Patient Instructions (Signed)
Medication Instructions:  Your physician recommends that you continue on your current medications as directed. Please refer to the Current Medication list given to you today.  Labwork: No new orders.   Testing/Procedures: No new orders.   Follow-Up: Your physician recommends that you schedule a follow-up appointment in: 3 MONTHS with Scott [name] PA-C  Any Other Special Instructions Will Be Listed Below (If Applicable).  If you need a refill on your cardiac medications before your next appointment, please call your pharmacy. 

## 2015-09-12 NOTE — Progress Notes (Signed)
Cardiology Office Note Date:  09/12/2015   ID:  [name] [name], DOB Sep 14, 1955, MRN [id number]  PCP:  Cathlean Cower, MD  Cardiologist:  Sherren Mocha, MD    Chief Complaint  Patient presents with  . Shortness of Breath     History of Present Illness: [name] [name] is a 60 y.o. male who presents for follow-up of coronary artery disease and congestive heart failure.  Patient had nuclear stress test 06/16/15 EF 35% no ST segment deviation during stress, was an intermediate study with a large severe predominantly fixed inferior lateral defect consistent with prior infarct no significant ischemia.  Patient has history of CAD status post PCI to the RCA and PL 1, cutting balloon angioplasty secondary to in-stent restenosis of the RCA in 2008, ischemic cardiomyopathy with previous EF of 40%. He had a STEMI 08/2012 treated with DES to the LAD and had diffuse residual disease treated medically. EF was 35-40% with follow-up EF 50-55%. He had a NSTEMI treated with DES to the OM1, NSTEMI 03/2014 with LHC demonstrating severe disease in the small distal circumflex and OM 2, moderate disease in the mid RCA and distal LAD treated medically.   The patient is doing well at present. He admits to occasional dizziness when he first stands up. Also with exertional dyspnea, but no significant change from the past. He has had no recent chest pain or pressure. He's lost 20 pounds trying to eat better. He continues to smoke. He reports good compliance with his medications.  Past Medical History  Diagnosis Date  . HTN (hypertension)   . CAD (coronary artery disease)     a. s/p PCI/BMS pRCA 2002; b. PCI pRCA & DES p/m RCA 2006; c. PCI/DES OM4 2007; d. PCI/CBA to RCA for ISR 10/2006; e.08/2012 STEMI/Cath/PCI: LM nl, LAD95-71m (3.0x20 Promus Prem DES), 95/95 ap LAD (1.0-1.41mm), LCX 30p 95d (sm), LPL1 70-80p, patent stent, LPL2 nl, RCA 30p, 40/70 ISR, EF 35-40%.  . Ischemic cardiomyopathy     a. EF 40%; improved to normal;   b. 08/2012 Echo: EF 50-55%, mod LVH.  [name] GERD (gastroesophageal reflux disease)   . Tobacco abuse   . Obesity   . OSA (obstructive sleep apnea)     Does not use CPAP as of 05/2011  . Depression   . History of pneumonia   . Diabetes mellitus ([place])     a. A1c 8.8 08/2012->Metformin initiated. => b. A1c (9/14): 6.6  . Hyperlipidemia   . Benign essential HTN 03/09/2015    Past Surgical History  Procedure Laterality Date  . Coronary stent placement  2009  . Coronary angioplasty with stent placement    . Left heart catheterization with coronary angiogram N/A 08/31/2012    Procedure: LEFT HEART CATHETERIZATION WITH CORONARY ANGIOGRAM;  Surgeon: Burnell Blanks, MD;  Location: [facility] CATH LAB;  Service: Cardiovascular;  Laterality: N/A;  . Percutaneous coronary stent intervention (pci-s) Right 08/31/2012    Procedure: PERCUTANEOUS CORONARY STENT INTERVENTION (PCI-S);  Surgeon: Burnell Blanks, MD;  Location: [facility] CATH LAB;  Service: Cardiovascular;  Laterality: Right;  . Left heart catheterization with coronary angiogram N/A 11/16/2013    Procedure: LEFT HEART CATHETERIZATION WITH CORONARY ANGIOGRAM;  Surgeon: Blane Ohara, MD;  Location[facility] CATH LAB;  Service: Cardiovascular;  Laterality: N/A;  . Percutaneous coronary stent intervention (pci-s)  11/16/2013    Procedure: PERCUTANEOUS CORONARY STENT INTERVENTION (PCI-S);  Surgeon: Blane Ohara, MD;  Location: [facility] CATH LAB;  Service: Cardiovascular;;  . Left heart catheterization  with coronary angiogram N/A 03/15/2014    Procedure: LEFT HEART CATHETERIZATION WITH CORONARY ANGIOGRAM;  Surgeon: Peter M Martinique, MD;  Location: [facility] CATH LAB;  Service: Cardiovascular;  Laterality: N/A;    Current Outpatient Prescriptions  Medication Sig Dispense Refill  . amLODipine (NORVASC) 10 MG tablet TAKE 1 TABLET BY MOUTH EVERY DAY 90 tablet 3  . aspirin 81 MG tablet Take 81 mg by mouth daily.    . carvedilol (COREG) 6.25 MG tablet Take 1 tablet (6.25 mg  total) by mouth 2 (two) times daily with a meal. 60 tablet 6  . cloNIDine (CATAPRES) 0.3 MG tablet Take 1 tablet (0.3 mg total) by mouth 2 (two) times daily. 60 tablet 11  . clopidogrel (PLAVIX) 75 MG tablet Take 1 tablet (75 mg total) by mouth daily. 30 tablet 6  . escitalopram (LEXAPRO) 10 MG tablet TAKE 1 TABLET BY MOUTH EVERY DAY 90 tablet 1  . famotidine (PEPCID) 40 MG tablet Take 1 tablet (40 mg total) by mouth every 12 (twelve) hours. 60 tablet 10  . furosemide (LASIX) 80 MG tablet Take 80 mg by mouth 2 (two) times daily.  5  . gabapentin (NEURONTIN) 300 MG capsule TAKE 1 CAPSULE(300 MG) BY MOUTH AT BEDTIME 30 capsule 3  . isosorbide mononitrate (IMDUR) 30 MG 24 hr tablet Take 3 tablets (90 mg total) by mouth daily. 120 tablet 6  . losartan (COZAAR) 100 MG tablet Take 1 tablet (100 mg total) by mouth daily. 90 tablet 3  . metFORMIN (GLUCOPHAGE) 500 MG tablet Take by mouth 2 (two) times daily with a meal.    . Multiple Vitamins-Minerals (MULTIVITAMIN WITH MINERALS) tablet Take 1 tablet by mouth daily. CENTRUM SILVER MENS  50 PLUS    . nitroGLYCERIN (NITROSTAT) 0.4 MG SL tablet PLACE 1 TABLET UNDER THE TONGUE EVERY 5 MINUTES AS NEEDED FOR CHEST PAIN 25 tablet 5  . potassium chloride SA (K-DUR,KLOR-CON) 20 MEQ tablet Take 1 tablet (20 mEq total) by mouth 2 (two) times daily. 180 tablet 5  . rosuvastatin (CRESTOR) 40 MG tablet Take 1 tablet (40 mg total) by mouth daily. 90 tablet 3  . spironolactone (ALDACTONE) 25 MG tablet Take 1 tablet (25 mg total) by mouth daily. 30 tablet 6  . traMADol (ULTRAM) 50 MG tablet Take 1 tablet (50 mg total) by mouth every 6 (six) hours as needed. 120 tablet 2   No current facility-administered medications for this visit.    Allergies:   Penicillins   Social History:  The patient  reports that he has been smoking Cigarettes.  He has a 15 pack-year smoking history. He has never used smokeless tobacco. He reports that he does not drink alcohol or use illicit  drugs.   Family History:  The patient's  family history includes Cancer in his mother; Depression in his mother; Hypertension in his mother; Other in his father. There is no history of Heart attack or Stroke.    ROS:  Please see the history of present illness.  Otherwise, review of systems is positive for balance problems, shortness of breath, dizziness, leg swelling.  All other systems are reviewed and negative.    PHYSICAL EXAM: VS:  BP 110/64 mmHg  Pulse 64  Ht 6\' 1"  (1.854 m)  Wt 322 lb (146.058 kg)  BMI 42.49 kg/m2 , BMI Body mass index is 42.49 kg/(m^2). GEN: Well nourished, well developed, pleasant obese male in no acute distress HEENT: normal Neck: no JVD, no masses. No carotid bruits Cardiac: RRR  without murmur or gallop                Respiratory:  clear to auscultation bilaterally, normal work of breathing GI: soft, nontender, nondistended, + BS MS: no deformity or atrophy Ext: no pretibial edema, pedal pulses 2+= bilaterally Skin: warm and dry, no rash Neuro:  Strength and sensation are intact Psych: euthymic mood, full affect  EKG:  EKG is not ordered today.  Recent Labs: 02/11/2015: Magnesium 2.1 06/27/2015: ALT 22; BUN 21; Creatinine, Ser 1.35; Hemoglobin 14.7; Platelets 244.0; Potassium 4.5; Sodium 143; TSH 1.31   Lipid Panel     Component Value Date/Time   CHOL 113 06/27/2015 1005   TRIG 113.0 06/27/2015 1005   HDL 36.20* 06/27/2015 1005   CHOLHDL 3 06/27/2015 1005   VLDL 22.6 06/27/2015 1005   LDLCALC 54 06/27/2015 1005      Wt Readings from Last 3 Encounters:  09/12/15 322 lb (146.058 kg)  08/18/15 346 lb (156.945 kg)  08/16/15 316 lb (143.337 kg)     Cardiac Studies Reviewed: Nuclear stress test 06/16/15 Study Highlights     Nuclear stress EF: 35%.  There was no ST segment deviation noted during stress.  This is an intermediate risk study.  Findings consistent with prior myocardial infarction.  The left ventricular ejection fraction is  moderately decreased (30-44%).  Intermediate risk stress nuclear study with a large, severe, predominantly fixed inferior lateral defect consistent with prior infarct; no significant ischemia; EF 35 but visually appears better; akinesis of the basal and mid inferior lateral wall; mild LVE; suggest echo to better assess LV function. Study intermediate risk due to reduced LV function.    Coronary angiography: Coronary dominance: right  Left mainstem: Normal.  Left anterior descending (LAD): The proximal LAD is ectactic. There is a stent in the mid vessel that is widely patent. The very distal LAD is diffusely diseased up to 70% at the apex.   Left circumflex (LCx): The LCx has diffuse ecstasia in the proximal and mid vessel. The first OM is small and chronically occluded. The second OM is widely patent at prior stent sites. There is severe disease in the distal OM2 up to 90%. OM 3 has mild disease to 30-40%. The distal LCx is a very small branch with 90% stenosis.  Right coronary artery (RCA): The RCA has a long stented segment in the proximal to mid vessel. There is diffuse in stent disease in the mid RCA up to 60-70%.   Left ventriculography: Left ventricular systolic function is abnormal, there is inferobasal akinesis. LVEF is estimated at 50%, there is no significant mitral regurgitation   Final Conclusions:  1. Diffuse CAD. There is severe disease in the small distal LCx and OM2. There is moderate disease in the mid RCA and distal LAD. This has not changed significantly since his prior study. 2. Mild LV dysfunction.  Recommendations: Continue medical therapy with risk factor modification.  Peter Martinique, [place] 03/15/2014, 4:23 PM          2-D echo 07/07/2015: Study Conclusions  - Left ventricle: The cavity size was normal. Wall thickness was  normal. Systolic function was mildly reduced. The estimated  ejection fraction was in the range of 45% to 50%.  Akinesis of the  basalinferior myocardium. - Left atrium: The atrium was mildly dilated. - Right atrium: The atrium was mildly dilated. - Pulmonary arteries: Systolic pressure was mildly increased. PA  peak pressure: 33 mm Hg (S).  ASSESSMENT AND PLAN: 1.  CAD, native vessel, without angina: The patient seems to be doing very well at present. His blood pressure is much better controlled and he reports good compliance with his medical program. He remains on dual antiplatelet therapy with aspirin and Plavix which should be continued long-term in an absence of bleeding problems.  2. Chronic combined systolic and diastolic heart failure, NYHA functional class II: Some limitation from exertional dyspnea. I suspect this is a combination of chronic heart failure and continued tobacco use. LVEF by most recent echo was improved. Continue current management which includes a beta blocker, ARB, and spironolactone.  3. Hypertension with CHF and CKD: well-controlled  4. Tobacco use: ongoing. Cessation counseling done.  5. Obesity: weight down about 20# from last month. He was encouraged to continue with efforts.   Current medicines are reviewed with the patient today.  The patient does not have concerns regarding medicines.  Labs/ tests ordered today include:  No orders of the defined types were placed in this encounter.    Disposition:   FU 3 months with [name], PA-C.   Deatra James, MD  09/12/2015 1:00 PM    [address], [place], [place]  [address] Phone: [phone]; Fax: [phone]

## 2015-09-15 ENCOUNTER — Other Ambulatory Visit: Payer: Self-pay | Admitting: *Deleted

## 2015-09-15 NOTE — Patient Outreach (Signed)
[place] [facility]) Care Management  09/15/2015  [name] [birth year] [id number]   Assessment: Initial home visit - Patient No Show Initial home visit scheduled for today at 9 am.Arrived at patient's home. Doorbell rang and knocked on patient's door and window several times, however, no answer at the door. Patient's neighbor in duplex states unsure where patient is. No noted admission to hospital or provider's appointment at this time. Telephone call placed to patient twice but no answer. HIPAA compliant voice message left with name and contact information. Care management coordinator left a note on patient's door requesting a call back from patient.  Care management coordinator will follow up with patient by phone to reschedule home visit.   Plan: Will await for patient's return call. If unable to receive a call back; will follow up with patient by phone to reschedule initial home visit.   Fidelis Loth A. Prosperity Darrough, BSN, RN-BC [place] Management Coordinator Cell: [phone]

## 2015-09-20 ENCOUNTER — Other Ambulatory Visit: Payer: Self-pay | Admitting: *Deleted

## 2015-09-20 NOTE — Patient Outreach (Signed)
[place] [facility]) Care Management  09/20/2015  Gregory Kollmann 1955-07-31 [id number]   Assessment: Care coordination call Unable to receive any call back from patient to messages left on his voice mailbox to reschedule previously scheduled initial home visit. Call placed and spoke with patient who was apologetic for not being at home for initial home visit scheduled on 09/15/15. He verbalized that he was at his sister's house and just got back yesterday.  He mentioned that he is now beginning to walk again using cane since he is not bothered much by his back pain. He informed care management coordinator that he was able to have battery replacement of his weighing scale and started checking weight. Patient reports that weight today and yesterday had been 313 pounds. Encouraged patient to continue monitoring and recording daily and notify provider for any weight gain 3 pounds overnight or 5 pounds in a week. Blood sugar this morning was 110 as stated.  He admits that he was not able to set-up an appointment with primary care provider as of yet but hoping to call and schedule one this week as stated. Reminded patient to schedule a follow-up visit before going out of town and he agreed. He reports being able to see Dr. [name] (cardiologist) last week with excellent report.  Patient denies any urgent needs at this time. He informed care management coordinator that he is leaving this Thursday or sooner to go out of town and will be out for the rest of next week. He requested to reschedule initial home visit on the week after next. Contact information is with patient and encouraged to call [facility] management coordinator or 24 hour nurse line if needed.   Plan: Initial home visit rescheduled on 10/03/15 per patient request.  Edwena Felty A. Tayley Mudrick, BSN, RN-BC [place] Management Coordinator Cell: [phone]

## 2015-09-22 DIAGNOSIS — G4733 Obstructive sleep apnea (adult) (pediatric): Secondary | ICD-10-CM | POA: Diagnosis not present

## 2015-09-26 ENCOUNTER — Other Ambulatory Visit: Payer: Self-pay | Admitting: Physician Assistant

## 2015-10-03 ENCOUNTER — Other Ambulatory Visit: Payer: Self-pay | Admitting: *Deleted

## 2015-10-03 DIAGNOSIS — G4733 Obstructive sleep apnea (adult) (pediatric): Secondary | ICD-10-CM | POA: Diagnosis not present

## 2015-10-03 NOTE — Patient Outreach (Signed)
[facility]) Care Management  10/03/2015  [name] [name] 1955-10-28 [id number]   Assessment: Rescheduled Routine Home Visit - Patient No Show  Arrived at patient's home for a rescheduled routine home visit today. Care management coordinator rang doorbell several times and knocked on patient's door several times as well but no answer at the door.  No noted admission to hospital or provider's appointment at this time. Care management coordinator placed a call to patient but no answer. HIPAA compliant voice message left with name and contact information. Note left on patient's door requesting patient to call back care management coordinator.    Plan: Will await for patient's return call. If unable to receive a call back, care management coordinator will attempt to contact patient to follow up by phone to reschedule home visit again.   Quintus Premo A. Ludger Bones, BSN, RN-BC [place] Management Coordinator Cell: [phone]

## 2015-10-04 ENCOUNTER — Other Ambulatory Visit: Payer: Self-pay | Admitting: *Deleted

## 2015-10-04 NOTE — Patient Outreach (Signed)
[name] [name]) Care Management  10/04/2015  [name] [name] June 25, 1955 [id number]   Assessment: Care coordination call Unable to receive a call back from patient to message left on his voicemail yesterday.  Call placed to speak with patient and attempt to reschedule routine home visit but unable to reach him. HIPAA compliant voice message left with name and contact number.   Call placed to patient's sister- [name] [name] (emergency contact) but phone number is disconnected or no longer in service and unable to leave a message. Call placed to emergency contact- [name] [name] but phone number is not a working number and unable to leave a message.  Plan: Will await for patient's return call. If unable to receive a call back, will schedule for next outreach call.  Will attempt to contact primary care provider's office to obtain alternative phone number to use inorder to contact patient.   [name] [name], BSN, RN-BC [place] Management Coordinator Cell: [phone]

## 2015-10-05 ENCOUNTER — Other Ambulatory Visit: Payer: Self-pay | Admitting: *Deleted

## 2015-10-05 NOTE — Patient Outreach (Signed)
[facility]) Care Management  10/05/2015  [name] [name] [birth year] [id number]   Assessment: Care coordination call # 2 Unable to receive any call back from patient to messages left on his answering machine.  Call placed to patient's primary care provider's office (spoke with [facility]) to obtain alternate contact numbers for patient since care management coordinator is having difficulty to reach him and his emergency contacts. Care management coordinator was told that the office has the same phone numbers as listed with [facility].  Notified office that sister's phone number had been disconnected or no longer in service. Nadyne Coombes states she will notify the nurse and Dr. [name] regarding [facility] care management coordinator's unsuccessful attempts to get in touch with patient.   Call placed to speak with patient but unable to reach him. HIPAA compliant voice message left with name and contact number.   Plan: Will await for patient's return call. If unable to receive a call back, will attempt to call again tomorrow.   Wayman Hoard A. Duayne Brideau, BSN, RN-BC [place] Management Coordinator Cell: [phone]

## 2015-10-06 ENCOUNTER — Other Ambulatory Visit: Payer: Self-pay | Admitting: Cardiovascular Disease

## 2015-10-06 ENCOUNTER — Other Ambulatory Visit: Payer: Self-pay | Admitting: *Deleted

## 2015-10-06 NOTE — Patient Outreach (Addendum)
[facility]) Care Management  10/06/2015  [name] [name] 1956-01-20 [id number]   Assessment: Care coordination #3 Unable to receive a call back from patient to messages left on his voicemail. Call placed today for follow-up and was able to speak with patient stating that he had been out of town.  Unable to set-up a reschedule date for the initial home visit since patient prefers if he "can be dropped off the program".  Patient was made aware that this program is voluntary and he is not obliged to participate.  Encouraged patient to call back if he is ready or to let his primary care provider refer him back to [facility] care management once he is ready and willing to participate with the program. Patient agreed to close case for now and notify primary care provider of case closure.  Plan: Will close case since patient prefers to be dropped of the program for now. Will notify primary care provider of case closure.   Maryalyce Sanjuan A. Tashay Bozich, BSN, RN-BC [place] Management Coordinator Cell: [phone]

## 2015-10-07 ENCOUNTER — Encounter: Payer: Self-pay | Admitting: *Deleted

## 2015-10-13 ENCOUNTER — Ambulatory Visit: Payer: Commercial Managed Care - HMO | Admitting: Physical Medicine & Rehabilitation

## 2015-10-22 DIAGNOSIS — G4733 Obstructive sleep apnea (adult) (pediatric): Secondary | ICD-10-CM | POA: Diagnosis not present

## 2015-10-28 ENCOUNTER — Telehealth: Payer: Self-pay | Admitting: Cardiovascular Disease

## 2015-10-28 NOTE — Telephone Encounter (Signed)
I spoke with the pt and made him aware that he should be taking Carvedilol 6.25mg  twice a day.  The pt is going to throw away the bottle of 3.125mg  tablets.  The pt also made me aware that in the last week he has had swelling in his feet and ankles.  The pt normally monitors his salt intake but last week the pt had visitors and he ate out frequently. The pt is taking his Furosemide as directed.  I advised him that he can take an extra Furosemide today and elevate his legs.  The pt will contact our office if he develops SOB, worsening swelling or weight gain. Pt agreed with plan.

## 2015-10-28 NOTE — Telephone Encounter (Signed)
New message      Pt c/o medication issue:  1. Name of Medication: carvedilol 2. How are you currently taking this medication (dosage and times per day)? 3.125mg  and 6.25mg  3. Are you having a reaction (difficulty breathing--STAT)?   4. What is your medication issue? Pt has presc bottles for both dosages.  Which one is he to be taking?

## 2015-12-12 ENCOUNTER — Other Ambulatory Visit: Payer: Self-pay | Admitting: Family Medicine

## 2015-12-14 NOTE — Progress Notes (Signed)
Cardiology Office Note:    Date:  12/14/2015   ID:  [name] [name], DOB [birth year], MRN [id number]  PCP:  Cathlean Cower, MD  Cardiologist:  Dr. Sherren Mocha / [name], PA-C   Sleep:  Dr. Fransico Him   Referring MD: Biagio Borg, MD   Chief Complaint  Patient presents with  . Follow-up    CAD, CHF    History of Present Illness:     [name] [name] is a 60 y.o. male with a hx of prior patient of mine from [place] with a hx of CAD, s/p PCI to the RCA and PL1, cutting balloon angioplasty secondary to in-stent restenosis of the RCA in 5/08, ischemic cardiomyopathy with previous EF 40% (improved to normal in the past), diastolic CHF, HTN, HL, OSA.   Admitted 08/2012 with an anterior STEMI treated with a Promus DES to the LAD. He had diffuse residual disease and medical therapy was recommended. EF was 35-40%. Follow up demonstrated improved LVF with EF 50-55%.   Admitted 11/2013 with NSTEMI. Severe stenosis OM1 treated with PCI (2.75x15 mm Xience Alpine DES).  Admitted 03/2014 with NSTEMI. LHC demonstrated severe disease in the small distal LCx and OM2, moderate disease in the mid RCA and distal LAD. This was not changed significantly since his prior study and medical therapy was continued. He had BB reduced 2/2 bradycardia.   Admitted 9/16 with chest pain. D-dimer was negative. Troponin levels remained normal. Echocardiogram demonstrated normal LV function.   Admission Dates: 06/06/15-06/09/15  Past Medical History  Diagnosis Date  . HTN (hypertension)   . CAD (coronary artery disease)     a. s/p PCI/BMS pRCA 2002; b. PCI pRCA & DES p/m RCA 2006; c. PCI/DES OM4 2007; d. PCI/CBA to RCA for ISR 10/2006; e.08/2012 STEMI/Cath/PCI: LM nl, LAD95-45m (3.0x20 Promus Prem DES), 95/95 ap LAD (1.0-1.63mm), LCX 30p 95d (sm), LPL1 70-80p, patent stent, LPL2 nl, RCA 30p, 40/70 ISR, EF 35-40%.  . Ischemic cardiomyopathy     a. EF 40%; improved to normal;  b. 08/2012 Echo: EF 50-55%, mod LVH.    [name] GERD (gastroesophageal reflux disease)   . Tobacco abuse   . Obesity   . OSA (obstructive sleep apnea)     Does not use CPAP as of 05/2011  . Depression   . History of pneumonia   . Diabetes mellitus ([place])     a. A1c 8.8 08/2012->Metformin initiated. => b. A1c (9/14): 6.6  . Hyperlipidemia   . Benign essential HTN 03/09/2015    Past Surgical History  Procedure Laterality Date  . Coronary stent placement  2009  . Coronary angioplasty with stent placement    . Left heart catheterization with coronary angiogram N/A 08/31/2012    Procedure: LEFT HEART CATHETERIZATION WITH CORONARY ANGIOGRAM;  Surgeon: Burnell Blanks, MD;  Location: [facility] CATH LAB;  Service: Cardiovascular;  Laterality: N/A;  . Percutaneous coronary stent intervention (pci-s) Right 08/31/2012    Procedure: PERCUTANEOUS CORONARY STENT INTERVENTION (PCI-S);  Surgeon: Burnell Blanks, MD;  Location: [facility] CATH LAB;  Service: Cardiovascular;  Laterality: Right;  . Left heart catheterization with coronary angiogram N/A 11/16/2013    Procedure: LEFT HEART CATHETERIZATION WITH CORONARY ANGIOGRAM;  Surgeon: Blane Ohara, MD;  Location: [facility] CATH LAB;  Service: Cardiovascular;  Laterality: N/A;  . Percutaneous coronary stent intervention (pci-s)  11/16/2013    Procedure: PERCUTANEOUS CORONARY STENT INTERVENTION (PCI-S);  Surgeon: Blane Ohara, MD;  Location: [facility] CATH LAB;  Service: Cardiovascular;;  . Left heart  catheterization with coronary angiogram N/A 03/15/2014    Procedure: LEFT HEART CATHETERIZATION WITH CORONARY ANGIOGRAM;  Surgeon: Peter M Martinique, MD;  Location: [facility] CATH LAB;  Service: Cardiovascular;  Laterality: N/A;    Current Medications: Outpatient Prescriptions Prior to Visit  Medication Sig Dispense Refill  . amLODipine (NORVASC) 10 MG tablet TAKE 1 TABLET BY MOUTH EVERY DAY 90 tablet 3  . aspirin 81 MG tablet Take 81 mg by mouth daily.    . carvedilol (COREG) 6.25 MG tablet Take 1 tablet (6.25 mg total)  by mouth 2 (two) times daily with a meal. 60 tablet 6  . cloNIDine (CATAPRES) 0.3 MG tablet Take 1 tablet (0.3 mg total) by mouth 2 (two) times daily. 60 tablet 11  . clopidogrel (PLAVIX) 75 MG tablet Take 1 tablet (75 mg total) by mouth daily. 30 tablet 6  . escitalopram (LEXAPRO) 10 MG tablet TAKE 1 TABLET BY MOUTH EVERY DAY 90 tablet 1  . famotidine (PEPCID) 40 MG tablet Take 1 tablet (40 mg total) by mouth every 12 (twelve) hours. 60 tablet 10  . furosemide (LASIX) 80 MG tablet Take 80 mg by mouth 2 (two) times daily.  5  . gabapentin (NEURONTIN) 300 MG capsule TAKE 1 CAPSULE(300 MG) BY MOUTH AT BEDTIME 30 capsule 3  . isosorbide mononitrate (IMDUR) 30 MG 24 hr tablet TAKE 3 TABLETS BY MOUTH DAILY 270 tablet 3  . losartan (COZAAR) 100 MG tablet Take 1 tablet (100 mg total) by mouth daily. 90 tablet 3  . metFORMIN (GLUCOPHAGE) 500 MG tablet Take by mouth 2 (two) times daily with a meal.    . Multiple Vitamins-Minerals (MULTIVITAMIN WITH MINERALS) tablet Take 1 tablet by mouth daily. CENTRUM SILVER MENS  50 PLUS    . nitroGLYCERIN (NITROSTAT) 0.4 MG SL tablet PLACE 1 TABLET UNDER THE TONGUE EVERY 5 MINUTES AS NEEDED FOR CHEST PAIN 25 tablet 5  . potassium chloride SA (K-DUR,KLOR-CON) 20 MEQ tablet Take 1 tablet (20 mEq total) by mouth 2 (two) times daily. 180 tablet 5  . rosuvastatin (CRESTOR) 40 MG tablet Take 1 tablet (40 mg total) by mouth daily. 90 tablet 3  . spironolactone (ALDACTONE) 25 MG tablet Take 1 tablet (25 mg total) by mouth daily. 30 tablet 6  . traMADol (ULTRAM) 50 MG tablet Take 1 tablet (50 mg total) by mouth every 6 (six) hours as needed. (Patient not taking: Reported on 09/20/2015) 120 tablet 2   No facility-administered medications prior to visit.      Allergies:   Penicillins   Social History   Social History  . Marital Status: Single    Spouse Name: N/A  . Number of Children: 0  . Years of Education: N/A   Occupational History  . retired    Social History  Main Topics  . Smoking status: Current Every Day Smoker -- 0.50 packs/day for 30 years    Types: Cigarettes  . Smokeless tobacco: Never Used     Comment: trying to cut down  . Alcohol Use: No  . Drug Use: No     Comment: remote marijuana use  . Sexual Activity: Not Currently   Other Topics Concern  . Not on file   Social History Narrative   Lives alone.  He has no children.   He retired early due to health problems.           Family History:  The patient's family history includes Cancer in his mother; Depression in his mother; Hypertension in his  mother; Other in his father. There is no history of Heart attack or Stroke.   ROS:   Please see the history of present illness.    ROS All other systems reviewed and are negative.   Physical Exam:    VS:  There were no vitals taken for this visit.   Physical Exam  Wt Readings from Last 3 Encounters:  09/12/15 322 lb (146.058 kg)  08/18/15 346 lb (156.945 kg)  08/16/15 316 lb (143.337 kg)      Studies/Labs Reviewed:     EKG:  EKG is  ordered today.  The ekg ordered today demonstrates   Recent Labs: 02/11/2015: Magnesium 2.1 06/27/2015: ALT 22; BUN 21; Creatinine, Ser 1.35; Hemoglobin 14.7; Platelets 244.0; Potassium 4.5; Sodium 143; TSH 1.31   Recent Lipid Panel    Component Value Date/Time   CHOL 113 06/27/2015 1005   TRIG 113.0 06/27/2015 1005   HDL 36.20* 06/27/2015 1005   CHOLHDL 3 06/27/2015 1005   VLDL 22.6 06/27/2015 1005   LDLCALC 54 06/27/2015 1005    Additional studies/ records that were reviewed today include:   Echo 07/07/15 - Left ventricle: The cavity size was normal. Wall thickness was  normal. Systolic function was mildly reduced. The estimated  ejection fraction was in the range of 45% to 50%. Akinesis of the  basalinferior myocardium. - Left atrium: The atrium was mildly dilated. - Right atrium: The atrium was mildly dilated. - Pulmonary arteries: Systolic pressure was mildly increased. PA   peak pressure: 33 mm Hg (S).  Myoview 06/16/15  Nuclear stress EF: 35%.  There was no ST segment deviation noted during stress.  This is an intermediate risk study.  Findings consistent with prior myocardial infarction.  The left ventricular ejection fraction is moderately decreased (30-44%). Intermediate risk stress nuclear study with a large, severe, predominantly fixed inferior lateral defect consistent with prior infarct; no significant ischemia; EF 35 but visually appears better; akinesis of the basal and mid inferior lateral wall; mild LVE; suggest echo to better assess LV function. Study intermediate risk due to reduced LV function.  Echo 9/21/6 Inf HK, mild LVH, EF 55%, mild LAE, normal RVF, mild RAE, PASP 35 mmHg  LHC (10/15):  Proximal LAD ectatic, mid LAD stent patent, distal LAD 70% at the apex, proximal and mid circumflex diffuse ectasia, small OM1 with CTO, OM2 stent patent, distal OM2 90%, OM3 30-40%, very distal circumflex 90%, proximal to mid RCA stent patent, mid RCA stent 60-70% ISR, EF 50%, inferobasal akinesis  Echo (6/15):  Mild LVH. EF 50% to 55%. Wall motion was normal. Grade 1 diastolic dysfunction. Left atrium: The atrium was mildly dilated. Right ventricle: The cavity size was moderately dilated. Right atrium: The atrium was mildly to moderately dilated.  Myoview 9/12:  mod scar in base/mid inf and IL segments, slight peri-infarct ischemia - low risk (med Rx continued).  Carotid US (4/13): Bilateral: no ICA stenosis   ASSESSMENT:     1. Coronary artery disease involving native coronary artery of native heart without angina pectoris   2. Chronic combined systolic and diastolic CHF (congestive heart failure) ([place])   3. Benign essential HTN   4. Hyperlipidemia     PLAN:     In order of problems listed above:  1.Coronary artery disease: S/p multiple PCI procedures in the setting of ACS/STEMI. Last LHC in 10/15 with stable anatomy. He was  evaluated in 9/16 with CP and neg enzymes. Cardiology in the hospital recommended OP nuclear stress  test. This was not done. However, given his known anatomy, I am not sure a nuclear study would be useful. It would undoubtedly be abnormal. He typically presents with elevated CEs when he has progression of disease. And, he has not had further chest symptoms since the hospital stay in 9/16.  Continue Amlodipine, ASA, beta blocker, nitrates, Plavix, statin.  2.Chronic combined systolic and diastolic congestive heart failure:  Volume stable. Continue current Rx.   3.HTN:  BP remains elevated. We discussed the need to lose weight and stop smoking. Increase Clonidine to 0.3 mg bid. Continue Norvasc, carvedilol, isosorbide, losartan. Arrange FU BMET.  4.Hyperlipidemia: Continue statin. Arrange FU Lipids and LFTs.  5.Tobacco abuse: He knows that he needs to quit.   6. OSA (obstructive sleep apnea): Continue CPAP. He inadvertently removes his mask in his sleep and need his test repeated to get his device covered by Medicare. Will reorder Split Night.    Medication Adjustments/Labs and Tests Ordered: Current medicines are reviewed at length with the patient today.  Concerns regarding medicines are outlined above.  Medication changes, Labs and Tests ordered today are outlined in the Patient Instructions noted below. There are no Patient Instructions on file for this visit. Signed, [name], PA-C  12/14/2015 6:09 PM    [facility] [address], [place], [place]  [address] Phone: [phone]; Fax: [phone]     This encounter was created in error - please disregard.

## 2015-12-15 ENCOUNTER — Emergency Department (HOSPITAL_COMMUNITY): Payer: Commercial Managed Care - HMO

## 2015-12-15 ENCOUNTER — Encounter (HOSPITAL_COMMUNITY): Payer: Self-pay | Admitting: *Deleted

## 2015-12-15 ENCOUNTER — Observation Stay (HOSPITAL_COMMUNITY)
Admission: EM | Admit: 2015-12-15 | Discharge: 2015-12-16 | Disposition: A | Discharge status: Home / Self Care | Payer: Commercial Managed Care - HMO | Attending: Internal Medicine | Admitting: Internal Medicine

## 2015-12-15 ENCOUNTER — Encounter: Payer: Commercial Managed Care - HMO | Admitting: Physician Assistant

## 2015-12-15 DIAGNOSIS — F17210 Nicotine dependence, cigarettes, uncomplicated: Secondary | ICD-10-CM | POA: Insufficient documentation

## 2015-12-15 DIAGNOSIS — G4733 Obstructive sleep apnea (adult) (pediatric): Secondary | ICD-10-CM | POA: Diagnosis present

## 2015-12-15 DIAGNOSIS — R079 Chest pain, unspecified: Secondary | ICD-10-CM | POA: Diagnosis not present

## 2015-12-15 DIAGNOSIS — Z955 Presence of coronary angioplasty implant and graft: Secondary | ICD-10-CM | POA: Diagnosis not present

## 2015-12-15 DIAGNOSIS — N179 Acute kidney failure, unspecified: Secondary | ICD-10-CM | POA: Diagnosis not present

## 2015-12-15 DIAGNOSIS — E119 Type 2 diabetes mellitus without complications: Secondary | ICD-10-CM

## 2015-12-15 DIAGNOSIS — Z7982 Long term (current) use of aspirin: Secondary | ICD-10-CM | POA: Insufficient documentation

## 2015-12-15 DIAGNOSIS — I5042 Chronic combined systolic (congestive) and diastolic (congestive) heart failure: Secondary | ICD-10-CM | POA: Diagnosis present

## 2015-12-15 DIAGNOSIS — Z79899 Other long term (current) drug therapy: Secondary | ICD-10-CM | POA: Insufficient documentation

## 2015-12-15 DIAGNOSIS — Z720 Tobacco use: Secondary | ICD-10-CM | POA: Diagnosis present

## 2015-12-15 DIAGNOSIS — G629 Polyneuropathy, unspecified: Secondary | ICD-10-CM

## 2015-12-15 DIAGNOSIS — R0789 Other chest pain: Secondary | ICD-10-CM | POA: Diagnosis present

## 2015-12-15 DIAGNOSIS — I2510 Atherosclerotic heart disease of native coronary artery without angina pectoris: Secondary | ICD-10-CM

## 2015-12-15 DIAGNOSIS — I10 Essential (primary) hypertension: Secondary | ICD-10-CM | POA: Diagnosis present

## 2015-12-15 DIAGNOSIS — R0600 Dyspnea, unspecified: Secondary | ICD-10-CM | POA: Diagnosis not present

## 2015-12-15 DIAGNOSIS — E785 Hyperlipidemia, unspecified: Secondary | ICD-10-CM | POA: Diagnosis present

## 2015-12-15 DIAGNOSIS — I255 Ischemic cardiomyopathy: Secondary | ICD-10-CM | POA: Diagnosis present

## 2015-12-15 DIAGNOSIS — E1159 Type 2 diabetes mellitus with other circulatory complications: Secondary | ICD-10-CM

## 2015-12-15 DIAGNOSIS — E66813 Obesity, class 3: Secondary | ICD-10-CM | POA: Diagnosis present

## 2015-12-15 DIAGNOSIS — R0602 Shortness of breath: Secondary | ICD-10-CM | POA: Diagnosis not present

## 2015-12-15 DIAGNOSIS — K219 Gastro-esophageal reflux disease without esophagitis: Secondary | ICD-10-CM | POA: Diagnosis present

## 2015-12-15 DIAGNOSIS — Z7984 Long term (current) use of oral hypoglycemic drugs: Secondary | ICD-10-CM | POA: Diagnosis not present

## 2015-12-15 DIAGNOSIS — R0609 Other forms of dyspnea: Secondary | ICD-10-CM

## 2015-12-15 DIAGNOSIS — Z9861 Coronary angioplasty status: Secondary | ICD-10-CM

## 2015-12-15 DIAGNOSIS — Z87891 Personal history of nicotine dependence: Secondary | ICD-10-CM | POA: Diagnosis present

## 2015-12-15 HISTORY — DX: Non-ST elevation (NSTEMI) myocardial infarction: I21.4

## 2015-12-15 LAB — CBC
HCT: 39.4 % (ref 39.0–52.0)
Hemoglobin: 12.7 g/dL — ABNORMAL LOW (ref 13.0–17.0)
MCH: 28.7 pg (ref 26.0–34.0)
MCHC: 32.2 g/dL (ref 30.0–36.0)
MCV: 89.1 fL (ref 78.0–100.0)
PLATELETS: 212 10*3/uL (ref 150–400)
RBC: 4.42 MIL/uL (ref 4.22–5.81)
RDW: 15.6 % — ABNORMAL HIGH (ref 11.5–15.5)
WBC: 7.6 10*3/uL (ref 4.0–10.5)

## 2015-12-15 LAB — GLUCOSE, CAPILLARY
GLUCOSE-CAPILLARY: 138 mg/dL — AB (ref 65–99)
Glucose-Capillary: 109 mg/dL — ABNORMAL HIGH (ref 65–99)
Glucose-Capillary: 116 mg/dL — ABNORMAL HIGH (ref 65–99)

## 2015-12-15 LAB — BASIC METABOLIC PANEL
Anion gap: 8 (ref 5–15)
Anion gap: 7 (ref 5–15)
BUN: 31 mg/dL — ABNORMAL HIGH (ref 6–20)
BUN: 30 mg/dL — AB (ref 6–20)
CALCIUM: 10.2 mg/dL (ref 8.9–10.3)
CALCIUM: 10 mg/dL (ref 8.9–10.3)
CO2: 26 mmol/L (ref 22–32)
CO2: 29 mmol/L (ref 22–32)
CREATININE: 2.83 mg/dL — AB (ref 0.61–1.24)
CREATININE: 2.81 mg/dL — AB (ref 0.61–1.24)
Chloride: 107 mmol/L (ref 101–111)
Chloride: 105 mmol/L (ref 101–111)
GFR calc Af Amer: 27 mL/min — ABNORMAL LOW (ref >60)
GFR, EST AFRICAN AMERICAN: 27 mL/min — AB (ref >60)
GFR, EST NON AFRICAN AMERICAN: 23 mL/min — AB (ref >60)
GFR, EST NON AFRICAN AMERICAN: 23 mL/min — AB (ref >60)
GLUCOSE: 109 mg/dL — AB (ref 65–99)
Glucose, Bld: 101 mg/dL — ABNORMAL HIGH (ref 65–99)
Potassium: 3.6 mmol/L (ref 3.5–5.1)
Potassium: 3.5 mmol/L (ref 3.5–5.1)
SODIUM: 141 mmol/L (ref 135–145)
SODIUM: 141 mmol/L (ref 135–145)

## 2015-12-15 LAB — TROPONIN I
Troponin I: 0.03 ng/mL (ref <0.03)
Troponin I: 0.04 ng/mL (ref <0.03)

## 2015-12-15 LAB — I-STAT TROPONIN, ED: TROPONIN I, POC: 0.03 ng/mL (ref 0.00–0.08)

## 2015-12-15 LAB — BRAIN NATRIURETIC PEPTIDE: B Natriuretic Peptide: 34.2 pg/mL (ref 0.0–100.0)

## 2015-12-15 MED ORDER — ISOSORBIDE MONONITRATE ER 60 MG PO TB24
90.0000 mg | ORAL_TABLET | Freq: Every day | ORAL | Status: DC
  Administered 2015-12-15 – 2015-12-16 (×2): 90 mg via ORAL
  Filled 2015-12-15 (×2): qty 1

## 2015-12-15 MED ORDER — INSULIN ASPART 100 UNIT/ML ~~LOC~~ SOLN
0.0000 [IU] | Freq: Every day | SUBCUTANEOUS | Status: DC
Start: 2015-12-15 — End: 2015-12-16

## 2015-12-15 MED ORDER — INSULIN ASPART 100 UNIT/ML ~~LOC~~ SOLN
0.0000 [IU] | Freq: Three times a day (TID) | SUBCUTANEOUS | Status: DC
  Administered 2015-12-15 – 2015-12-16 (×2): 2 [IU] via SUBCUTANEOUS

## 2015-12-15 MED ORDER — SODIUM CHLORIDE 0.9 % IV BOLUS (SEPSIS)
500.0000 mL | Freq: Once | INTRAVENOUS | Status: AC
  Administered 2015-12-15: 500 mL via INTRAVENOUS

## 2015-12-15 MED ORDER — SODIUM CHLORIDE 0.9 % IV BOLUS (SEPSIS): 500.0000 mL | Freq: Once | INTRAVENOUS | Status: DC

## 2015-12-15 MED ORDER — ASPIRIN EC 81 MG PO TBEC
81.0000 mg | DELAYED_RELEASE_TABLET | Freq: Every day | ORAL | Status: DC
  Administered 2015-12-15 – 2015-12-16 (×2): 81 mg via ORAL
  Filled 2015-12-15 (×2): qty 1

## 2015-12-15 MED ORDER — ENOXAPARIN SODIUM 30 MG/0.3ML ~~LOC~~ SOLN
30.0000 mg | Freq: Every day | SUBCUTANEOUS | Status: DC
  Administered 2015-12-15: 30 mg via SUBCUTANEOUS
  Filled 2015-12-15: qty 0.3

## 2015-12-15 MED ORDER — ROSUVASTATIN CALCIUM 10 MG PO TABS
40.0000 mg | ORAL_TABLET | Freq: Every day | ORAL | Status: DC
  Administered 2015-12-15: 40 mg via ORAL
  Filled 2015-12-15: qty 4

## 2015-12-15 MED ORDER — FAMOTIDINE 20 MG PO TABS
40.0000 mg | ORAL_TABLET | Freq: Two times a day (BID) | ORAL | Status: DC
Start: 2015-12-15 — End: 2015-12-16
  Administered 2015-12-15 – 2015-12-16 (×2): 40 mg via ORAL
  Filled 2015-12-15 (×2): qty 2

## 2015-12-15 MED ORDER — ESCITALOPRAM OXALATE 10 MG PO TABS
10.0000 mg | ORAL_TABLET | Freq: Every day | ORAL | Status: DC
  Administered 2015-12-15 – 2015-12-16 (×2): 10 mg via ORAL
  Filled 2015-12-15 (×2): qty 1

## 2015-12-15 MED ORDER — CARVEDILOL 6.25 MG PO TABS
6.2500 mg | ORAL_TABLET | Freq: Two times a day (BID) | ORAL | Status: DC
  Administered 2015-12-15 – 2015-12-16 (×2): 6.25 mg via ORAL
  Filled 2015-12-15 (×2): qty 1

## 2015-12-15 MED ORDER — GABAPENTIN 300 MG PO CAPS
300.0000 mg | ORAL_CAPSULE | Freq: Every day | ORAL | Status: DC
  Administered 2015-12-15: 300 mg via ORAL
  Filled 2015-12-15: qty 1

## 2015-12-15 MED ORDER — CLOPIDOGREL BISULFATE 75 MG PO TABS
75.0000 mg | ORAL_TABLET | Freq: Every day | ORAL | Status: DC
  Administered 2015-12-15 – 2015-12-16 (×2): 75 mg via ORAL
  Filled 2015-12-15 (×2): qty 1

## 2015-12-15 MED ORDER — ADULT MULTIVITAMIN W/MINERALS CH
1.0000 | ORAL_TABLET | Freq: Every day | ORAL | Status: DC
  Administered 2015-12-15 – 2015-12-16 (×2): 1 via ORAL
  Filled 2015-12-15 (×2): qty 1

## 2015-12-15 MED ORDER — ONDANSETRON HCL 4 MG/2ML IJ SOLN: 4.0000 mg | Freq: Four times a day (QID) | INTRAMUSCULAR | Status: DC | PRN

## 2015-12-15 MED ORDER — ACETAMINOPHEN 325 MG PO TABS: 650.0000 mg | ORAL_TABLET | ORAL | Status: DC | PRN

## 2015-12-15 MED ORDER — MULTI-VITAMIN/MINERALS PO TABS: 1.0000 | ORAL_TABLET | Freq: Every day | ORAL | Status: DC

## 2015-12-15 MED ORDER — ASPIRIN 81 MG PO TABS: 81.0000 mg | ORAL_TABLET | Freq: Every day | ORAL | Status: DC

## 2015-12-15 MED ORDER — GI COCKTAIL ~~LOC~~: 30.0000 mL | Freq: Four times a day (QID) | ORAL | Status: DC | PRN

## 2015-12-15 MED ORDER — DOCUSATE SODIUM 100 MG PO CAPS
100.0000 mg | ORAL_CAPSULE | Freq: Two times a day (BID) | ORAL | Status: DC
  Administered 2015-12-15 – 2015-12-16 (×2): 100 mg via ORAL
  Filled 2015-12-15 (×2): qty 1

## 2015-12-15 MED ORDER — POLYETHYLENE GLYCOL 3350 17 G PO PACK
17.0000 g | PACK | Freq: Every day | ORAL | Status: DC
  Administered 2015-12-15: 17 g via ORAL
  Filled 2015-12-15: qty 1

## 2015-12-15 NOTE — ED Notes (Signed)
IV team at bedside 

## 2015-12-15 NOTE — ED Notes (Signed)
Patient transported to X-ray 

## 2015-12-15 NOTE — ED Notes (Addendum)
2 RNs attempt IV-unsuccessful, IV team consulted

## 2015-12-15 NOTE — H&P (Signed)
History and Physical    Joaquim Tolen WUJ:[id number] DOB: 09/19/1955 DOA: 12/15/2015   PCP: Cathlean Cower, MD   Patient coming from/Resides with: Private residence/lives alone  Chief Complaint: Hervey Ard chest pain radiating to back  HPI: Eulon Allnutt is a 60 y.o. male with medical history significant for CAD with prior stents, ischemic cardiomyopathy with recovered EF based on echocardiogram January 2017, morbid obesity and sleep apnea, diabetes on metformin, dyslipidemia, and ongoing tobacco abuse. Patient presents to the ER 2/2 reports of sharp left-sided chest pain radiating to the left back lasting 5 minutes and was not typical of his previous ischemic chest pain when he had an MI (although he later told attending M.D. he thought it was typical of his prior MI). He had no other associated symptoms such as nausea diaphoresis or shortness of breath with the chest pain although he has had shortness of breath for about 2-3 days independent of recent chest pain. He is also reporting increased edema of the lower extremities but in addition reports dietary indiscretion especially over the Fourth of July holiday eating foods such as hot dogs and chips. He also endorses noncompliance with his CPAP, excessive daytime sleepiness and sitting most of the time in a recliner or with his feet dependent. He is currently chest pain-free.  ED Course:  Vital signs: PO temp 97.8-BP 114/60-pulse 58-respirations 20-room air saturations 90%-weight 306 pounds Two-view chest x-ray: No evidence of CHF or pneumonia Lab data: Sodium 141, potassium 3.6, BUN 31, creatinine 2.83, calcium 10.2, glucose 101, BNP 34.2, poc troponin 0.03, WBC 7600 no differential obtained, hemoglobin of 12.7, platelets 212,000 Medications and treatments: Normal saline lock placed  Review of Systems:  In addition to the HPI above,  No Fever-chills, myalgias or other constitutional symptoms No Headache, changes with Vision or hearing, new weakness,  tingling, numbness in any extremity No problems swallowing food or Liquids, or typical indigestion/reflux No Cough or palpitations, orthopnea  No N/V; no melena or hematochezia, no dark tarry stools, Bowel movements are not regular and the patient feels he may be constipated, No dysuria, hematuria or flank pain No new skin rashes, lesions, masses or bruises, No new joints pains-aches No recent weight gain or loss No polyuria, polydypsia or polyphagia,   Past Medical History  Diagnosis Date  . HTN (hypertension)   . CAD (coronary artery disease)     a. s/p PCI/BMS pRCA 2002; b. PCI pRCA & DES p/m RCA 2006; c. PCI/DES OM4 2007; d. PCI/CBA to RCA for ISR 10/2006; e.08/2012 STEMI/Cath/PCI: LM nl, LAD95-28m(3.0x20 Promus Prem DES), 95/95 ap LAD (1.0-1.522m, LCX 30p 95d (sm), LPL1 70-80p, patent stent, LPL2 nl, RCA 30p, 40/70 ISR, EF 35-40%.  . Ischemic cardiomyopathy     a. EF 40%; improved to normal;  b. 08/2012 Echo: EF 50-55%, mod LVH.  . Marland KitchenERD (gastroesophageal reflux disease)   . Tobacco abuse   . Obesity   . OSA (obstructive sleep apnea)     Does not use CPAP as of 05/2011  . Depression   . History of pneumonia   . [place]    a. A1c 8.8 08/2012->Metformin initiated. => b. A1c (9/14): 6.6  . Hyperlipidemia   . Benign essential HTN 03/09/2015    Past Surgical History  Procedure Laterality Date  . Coronary stent placement  2009  . Coronary angioplasty with stent placement    . Left heart catheterization with coronary angiogram N/A 08/31/2012    Procedure: LEFT HEART CATHETERIZATION WITH CORONARY  ANGIOGRAM;  Surgeon: Burnell Blanks, MD;  Location: [facility] CATH LAB;  Service: Cardiovascular;  Laterality: N/A;  . Percutaneous coronary stent intervention (pci-s) Right 08/31/2012    Procedure: PERCUTANEOUS CORONARY STENT INTERVENTION (PCI-S);  Surgeon: Burnell Blanks, MD;  Location: [facility] CATH LAB;  Service: Cardiovascular;  Laterality: Right;  . Left heart  catheterization with coronary angiogram N/A 11/16/2013    Procedure: LEFT HEART CATHETERIZATION WITH CORONARY ANGIOGRAM;  Surgeon: Blane Ohara, MD;  Location: [facility] CATH LAB;  Service: Cardiovascular;  Laterality: N/A;  . Percutaneous coronary stent intervention (pci-s)  11/16/2013    Procedure: PERCUTANEOUS CORONARY STENT INTERVENTION (PCI-S);  Surgeon: Blane Ohara, MD;  Location: [facility] CATH LAB;  Service: Cardiovascular;;  . Left heart catheterization with coronary angiogram N/A 03/15/2014    Procedure: LEFT HEART CATHETERIZATION WITH CORONARY ANGIOGRAM;  Surgeon: Peter M Martinique, MD;  Location: [facility] CATH LAB;  Service: Cardiovascular;  Laterality: N/A;    Social History   Social History  . Marital Status: Single    Spouse Name: N/A  . Number of Children: 0  . Years of Education: N/A   Occupational History  . retired    Social History Main Topics  . Smoking status: Current Every Day Smoker -- 1.0 packs/day for 30 years    Types: Cigarettes  . Smokeless tobacco: Never Used     Comment: trying to cut down  . Alcohol Use: No  . Drug Use: No     Comment: remote marijuana use  . Sexual Activity: Not Currently   Other Topics Concern  . Not on file   Social History Narrative   Lives alone.  He has no children.   He retired early due to health problems.          Mobility: Recently has gone walking with a cane secondary to low back pain Work history: Disabled   Allergies  Allergen Reactions  . Penicillins Other (See Comments)    Unknown childhood reaction    Family History  Problem Relation Age of Onset  . Coronary artery disease    . Depression Mother   . Cancer Mother     ovarian  . Hypertension Mother     Died, 99  . Other Father     motor vehicle accident  . Heart attack Neg Hx   . Stroke Neg Hx      Prior to Admission medications   Medication Sig Start Date End Date Taking? Authorizing Provider  amLODipine (NORVASC) 10 MG tablet TAKE 1 TABLET BY MOUTH EVERY  DAY 06/17/15   Biagio Borg, MD  aspirin 81 MG tablet Take 81 mg by mouth daily.    Historical Provider, MD  carvedilol (COREG) 6.25 MG tablet Take 1 tablet (6.25 mg total) by mouth 2 (two) times daily with a meal. 06/09/15   Eileen Stanford, PA-C  cloNIDine (CATAPRES) 0.3 MG tablet Take 1 tablet (0.3 mg total) by mouth 2 (two) times daily. 04/21/15   Liliane Shi, PA-C  clopidogrel (PLAVIX) 75 MG tablet Take 1 tablet (75 mg total) by mouth daily. 06/09/15   Eileen Stanford, PA-C  escitalopram (LEXAPRO) 10 MG tablet TAKE 1 TABLET BY MOUTH EVERY DAY 06/29/15   Biagio Borg, MD  famotidine (PEPCID) 40 MG tablet Take 1 tablet (40 mg total) by mouth every 12 (twelve) hours. 06/28/15   Imogene Burn, PA-C  furosemide (LASIX) 80 MG tablet Take 80 mg by mouth 2 (two) times daily. 01/28/15  Historical Provider, MD  gabapentin (NEURONTIN) 300 MG capsule TAKE 1 CAPSULE(300 MG) BY MOUTH AT BEDTIME 12/12/15   Lyndal Pulley, DO  isosorbide mononitrate (IMDUR) 30 MG 24 hr tablet TAKE 3 TABLETS BY MOUTH DAILY 09/27/15   Liliane Shi, PA-C  losartan (COZAAR) 100 MG tablet Take 1 tablet (100 mg total) by mouth daily. 12/10/14   Sherren Mocha, MD  metFORMIN (GLUCOPHAGE) 500 MG tablet Take by mouth 2 (two) times daily with a meal.    Historical Provider, MD  Multiple Vitamins-Minerals (MULTIVITAMIN WITH MINERALS) tablet Take 1 tablet by mouth daily. CENTRUM SILVER MENS  50 PLUS    Historical Provider, MD  nitroGLYCERIN (NITROSTAT) 0.4 MG SL tablet PLACE 1 TABLET UNDER THE TONGUE EVERY 5 MINUTES AS NEEDED FOR CHEST PAIN 07/04/15   Sherren Mocha, MD  potassium chloride SA (K-DUR,KLOR-CON) 20 MEQ tablet Take 1 tablet (20 mEq total) by mouth 2 (two) times daily. 06/09/15   Eileen Stanford, PA-C  rosuvastatin (CRESTOR) 40 MG tablet Take 1 tablet (40 mg total) by mouth daily. 04/13/15   Biagio Borg, MD  spironolactone (ALDACTONE) 25 MG tablet Take 1 tablet (25 mg total) by mouth daily. 06/09/15   Eileen Stanford,  PA-C  traMADol (ULTRAM) 50 MG tablet Take 1 tablet (50 mg total) by mouth every 6 (six) hours as needed. Patient not taking: Reported on 09/20/2015 06/27/15   Biagio Borg, MD    Physical Exam: Filed Vitals:   12/15/15 0730 12/15/15 0736 12/15/15 0950 12/15/15 0952  BP: 114/60 114/60    Pulse: 56 58 62 57  Temp:  97.8 F (36.6 C)    TempSrc:  Oral    Resp: '16 22 19 10  '$ Weight:  306 lb (138.8 kg)    SpO2: 100% 99% 98% 100%      Constitutional: NAD, calm, comfortable Eyes: PERRL, lids and conjunctivae normal ENMT: Mucous membranes are somewhat dry. Posterior pharynx clear of any exudate or lesions.Normal dentition.  Neck: normal, supple, no masses, no thyromegaly Respiratory: clear to auscultation bilaterally, no wheezing, no crackles. Normal respiratory effort. No accessory muscle use.  Cardiovascular: Regular rate and rhythm, no murmurs / rubs / gallops. Trace nonpitting bilateral lower extremity edema. 2+ pedal pulses. No carotid bruits.  Abdomen: no tenderness, no masses palpated. No hepatosplenomegaly. Bowel sounds positive.  Musculoskeletal: no clubbing / cyanosis. No joint deformity upper and lower extremities. Good ROM, no contractures. Normal muscle tone.  Skin: no rashes, lesions, ulcers. No induration Neurologic: CN 2-12 grossly intact. Sensation intact, DTR normal. Strength 5/5 x all 4 extremities.  Psychiatric: Normal judgment and insight. Alert and oriented x 3. Normal mood.    Labs on Admission: I have personally reviewed following labs and imaging studies  CBC:  Recent Labs Lab 12/15/15 0747  WBC 7.6  HGB 12.7*  HCT 39.4  MCV 89.1  PLT 678   Basic Metabolic Panel:  Recent Labs Lab 12/15/15 0747  NA 141  K 3.6  CL 107  CO2 26  GLUCOSE 101*  BUN 31*  CREATININE 2.83*  CALCIUM 10.2   GFR: Estimated Creatinine Clearance: 41.7 mL/min (by C-G formula based on Cr of 2.83). Liver Function Tests: No results for input(s): AST, ALT, ALKPHOS, BILITOT,  PROT, ALBUMIN in the last 168 hours. No results for input(s): LIPASE, AMYLASE in the last 168 hours. No results for input(s): AMMONIA in the last 168 hours. Coagulation Profile: No results for input(s): INR, PROTIME in the last 168 hours. Cardiac  Enzymes: No results for input(s): CKTOTAL, CKMB, CKMBINDEX, TROPONINI in the last 168 hours. BNP (last 3 results) No results for input(s): PROBNP in the last 8760 hours. HbA1C: No results for input(s): HGBA1C in the last 72 hours. CBG: No results for input(s): GLUCAP in the last 168 hours. Lipid Profile: No results for input(s): CHOL, HDL, LDLCALC, TRIG, CHOLHDL, LDLDIRECT in the last 72 hours. Thyroid Function Tests: No results for input(s): TSH, T4TOTAL, FREET4, T3FREE, THYROIDAB in the last 72 hours. Anemia Panel: No results for input(s): VITAMINB12, FOLATE, FERRITIN, TIBC, IRON, RETICCTPCT in the last 72 hours. Urine analysis:    Component Value Date/Time   COLORURINE YELLOW 06/27/2015 1005   APPEARANCEUR CLEAR 06/27/2015 1005   LABSPEC 1.020 06/27/2015 1005   PHURINE 5.5 06/27/2015 1005   GLUCOSEU NEGATIVE 06/27/2015 1005   GLUCOSEU NEGATIVE 02/11/2015 0701   HGBUR TRACE-INTACT* 06/27/2015 1005   HGBUR negative 08/23/2009 0000   BILIRUBINUR NEGATIVE 06/27/2015 1005   KETONESUR NEGATIVE 06/27/2015 1005   PROTEINUR 100* 02/11/2015 0701   UROBILINOGEN 0.2 06/27/2015 1005   NITRITE NEGATIVE 06/27/2015 1005   LEUKOCYTESUR NEGATIVE 06/27/2015 1005   Sepsis Labs: '@LABRCNTIP'$ (procalcitonin:4,lacticidven:4) )No results found for this or any previous visit (from the past 240 hour(s)).   Radiological Exams on Admission: Dg Chest 2 View  12/15/2015  CLINICAL DATA:  Shortness of breath and bilateral lower extremity edema for the past 2 days, chest pain this morning ; history of CHF, coronary artery disease and stent placement ; diabetes, current smoker. EXAM: CHEST  2 VIEW COMPARISON:  Portable chest x-ray of June 06, 2015 FINDINGS: The  lungs are adequately inflated. The interstitial markings are coarse. There is no alveolar infiltrate. There is no pleural effusion or pneumothorax. The heart is normal in size. The pulmonary vascularity is not engorged. The mediastinum is normal in width. The observed bony thorax is unremarkable. IMPRESSION: 1. No evidence of CHF nor pneumonia. 2. Coarse interstitial lung markings likely reflect the patient's smoking history. Scarring at the right lung base, stable. Electronically Signed   By: David  Martinique M.D.   On: 12/15/2015 08:15    EKG: (Independently reviewed) Sinus rhythm with bradycardic rate 53 bpm, chronic right bundle branch block, QTC 430 ms, no obvious ischemic changes and unchanged from previous EKG  Assessment/Plan Principal Problem:   Chest pain syndrome/ Coronary Artery Disease -Symptoms are quite atypical and not consistent with previous ischemic chest pain and are more suggestive of GI etiology -EKG initial troponin reassuring; continue to cycle troponins -Heart score 2 -Continue Imdur and carvedilol as blood pressure tolerates (see below) -Continue low-dose aspirin and Plavix as well as statin  Active Problems:    Acute kidney injury  -Baseline renal function: 21/1.35 -Current renal function: 31/2.83 -Suspect combination of diuretics and recent environmental water loss from sweating contributing to overall volume depletion -Hold Lasix and Aldactone-consider dose adjustment prior to discharge -Hold metformin and and Cozaar -Give normal saline bolus 500 mL 2 each over 4 hours and repeat the met after boluses complete and again in a.m. -Patient also suspects he is constipated and this too may be related to progressive fine depletion and ongoing use of diuretics-begin stool softener and MiraLAX    GASTROESOPHAGEAL REFLUX DISEASE -Chest pain symptoms as above appear to be more consistent with GI etiology -Continue preadmission PPI -prn GI cocktail    Ischemic  cardiomyopathy (EF 45-50% Jan 2017)/  Chronic combined systolic and diastolic CHF (congestive heart failure) -Patient complaining of shortness of breath and clinical  exam not consistent with heart failure noting chest x-ray unremarkable, lungs clear on exam, resting pulse oximetry normal, BNP normal -He does have some trace nonpitting lower extremity edema that is likely related to obesity, noncompliance with CPAP and recent dietary indiscretion -EF has recovered based on most recent echo and patient may have decreased diuretic needs and may need dose reduction in diuretics prior to discharge -Weight stable at 306 pounds -Current BP somewhat suboptimal     Obesity, Class III, BMI 40-49.9 (morbid obesity)  -Weight reduction strategies have been discussed with patient per his PCP -Patient's weight has dropped greater than 40 pounds since January 2017    DM2 (diabetes mellitus, type 2)  -Current CBGs appear controlled -Holding metformin in setting of acute kidney injury -Check CBGs and provide SSI -Check hemoglobin A1c     OSA (obstructive sleep apnea) -Noncompliant with CPAP at home -CPAP ordered while inpatient    Tobacco abuse -Patient admits to unfortunately increasing his tobacco use and is now gone up from one half pack per day to one pack per day -Counseled regarding tobacco cessation    Hyperlipidemia -Continue preadmission Crestor    Peripheral neuropathy  -Continue Neurontin at bedtime      DVT prophylaxis: Lovenox with dose adjusted for acute decreasing creatinine clearance Code Status: Full code  Family Communication: No family at bedside Disposition Plan: Anticipate discharge back to home environment once medically stable Consults called: None  Admission status: Observation/telemetry     ELLIS,ALLISON L. ANP-BC [facility] Pager [phone]   If 7PM-7AM, please contact night-coverage [email or web address] Password [email or web address]  12/15/2015, 10:03 AM

## 2015-12-15 NOTE — ED Notes (Addendum)
IV team unsuccessful-2nd IV RN to attempt.

## 2015-12-15 NOTE — ED Provider Notes (Signed)
CSN: [id number]     Arrival date & time 12/15/15  0724 History   First MD Initiated Contact with Patient 12/15/15 [phone]     Chief Complaint  Patient presents with  . Chest Pain     (Consider location/radiation/quality/duration/timing/severity/associated sxs/prior Treatment) HPI  Blood pressure 114/60, pulse 58, temperature 97.8 F (36.6 C), temperature source Oral, resp. rate 22, weight 138.8 kg, SpO2 99 %.  [name] [name] is a 60 y.o. male complaining of worsening shortness of breath and dyspnea on exertion onset 3-4 days ago with increasing bilateral lower extremity edema. Patient states he has been noncompliant with his diet secondary to the holidays specifically thinks that the hot dogs on the Fourth of July set off his CHF. He had an episode of chest pain radiating from the back to the front lasting approximately 5 minutes yesterday, resolved spontaneously. Patient denies cough, fever, chills, orthopnea, PND. Patient takes low-dose baby aspirin, was given full dose aspirin by EMS  Past Medical History  Diagnosis Date  . HTN (hypertension)   . CAD (coronary artery disease)     a. s/p PCI/BMS pRCA 2002; b. PCI pRCA & DES p/m RCA 2006; c. PCI/DES OM4 2007; d. PCI/CBA to RCA for ISR 10/2006; e.08/2012 STEMI/Cath/PCI: LM nl, LAD95-47m (3.0x20 Promus Prem DES), 95/95 ap LAD (1.0-1.27mm), LCX 30p 95d (sm), LPL1 70-80p, patent stent, LPL2 nl, RCA 30p, 40/70 ISR, EF 35-40%.  . Ischemic cardiomyopathy     a. EF 40%; improved to normal;  b. 08/2012 Echo: EF 50-55%, mod LVH.  [name] GERD (gastroesophageal reflux disease)   . Tobacco abuse   . Obesity   . OSA (obstructive sleep apnea)     Does not use CPAP as of 05/2011  . Depression   . History of pneumonia   . Diabetes mellitus ([place])     a. A1c 8.8 08/2012->Metformin initiated. => b. A1c (9/14): 6.6  . Hyperlipidemia   . Benign essential HTN 03/09/2015   Past Surgical History  Procedure Laterality Date  . Coronary stent placement  2009  .  Coronary angioplasty with stent placement    . Left heart catheterization with coronary angiogram N/A 08/31/2012    Procedure: LEFT HEART CATHETERIZATION WITH CORONARY ANGIOGRAM;  Surgeon: Burnell Blanks, MD;  Location: [facility] CATH LAB;  Service: Cardiovascular;  Laterality: N/A;  . Percutaneous coronary stent intervention (pci-s) Right 08/31/2012    Procedure: PERCUTANEOUS CORONARY STENT INTERVENTION (PCI-S);  Surgeon: Burnell Blanks, MD;  Location: [facility] CATH LAB;  Service: Cardiovascular;  Laterality: Right;  . Left heart catheterization with coronary angiogram N/A 11/16/2013    Procedure: LEFT HEART CATHETERIZATION WITH CORONARY ANGIOGRAM;  Surgeon: Blane Ohara, MD;  Location: [facility] CATH LAB;  Service: Cardiovascular;  Laterality: N/A;  . Percutaneous coronary stent intervention (pci-s)  11/16/2013    Procedure: PERCUTANEOUS CORONARY STENT INTERVENTION (PCI-S);  Surgeon: Blane Ohara, MD;  Location: [facility] CATH LAB;  Service: Cardiovascular;;  . Left heart catheterization with coronary angiogram N/A 03/15/2014    Procedure: LEFT HEART CATHETERIZATION WITH CORONARY ANGIOGRAM;  Surgeon: Peter M Martinique, MD;  Location: [facility] C[facility] LAB;  Service: Cardiovascular;  Laterality: N/A;   Family History  Problem Relation Age of Onset  . Coronary artery disease    . Depression Mother   . Cancer Mother     ovarian  . Hypertension Mother     Died, 39  . Other Father     motor vehicle accident  . Heart attack Neg Hx   .  Stroke Neg Hx    Social History  Substance Use Topics  . Smoking status: Current Every Day Smoker -- 0.50 packs/day for 30 years    Types: Cigarettes  . Smokeless tobacco: Never Used     Comment: trying to cut down  . Alcohol Use: No    Review of Systems  10 systems reviewed and found to be negative, except as noted in the HPI.   Allergies  Penicillins  Home Medications   Prior to Admission medications   Medication Sig Start Date End Date Taking? Authorizing Provider   amLODipine (NORVASC) 10 MG tablet TAKE 1 TABLET BY MOUTH EVERY DAY 06/17/15   Biagio Borg, MD  aspirin 81 MG tablet Take 81 mg by mouth daily.    Historical Provider, MD  carvedilol (COREG) 6.25 MG tablet Take 1 tablet (6.25 mg total) by mouth 2 (two) times daily with a meal. 06/09/15   Eileen Stanford, PA-C  cloNIDine (CATAPRES) 0.3 MG tablet Take 1 tablet (0.3 mg total) by mouth 2 (two) times daily. 04/21/15   Liliane Shi, PA-C  clopidogrel (PLAVIX) 75 MG tablet Take 1 tablet (75 mg total) by mouth daily. 06/09/15   Eileen Stanford, PA-C  escitalopram (LEXAPRO) 10 MG tablet TAKE 1 TABLET BY MOUTH EVERY DAY 06/29/15   Biagio Borg, MD  famotidine (PEPCID) 40 MG tablet Take 1 tablet (40 mg total) by mouth every 12 (twelve) hours. 06/28/15   Imogene Burn, PA-C  furosemide (LASIX) 80 MG tablet Take 80 mg by mouth 2 (two) times daily. 01/28/15   Historical Provider, MD  gabapentin (NEURONTIN) 300 MG capsule TAKE 1 CAPSULE(300 MG) BY MOUTH AT BEDTIME 12/12/15   Lyndal Pulley, DO  isosorbide mononitrate (IMDUR) 30 MG 24 hr tablet TAKE 3 TABLETS BY MOUTH DAILY 09/27/15   Liliane Shi, PA-C  losartan (COZAAR) 100 MG tablet Take 1 tablet (100 mg total) by mouth daily. 12/10/14   Sherren Mocha, MD  metFORMIN (GLUCOPHAGE) 500 MG tablet Take by mouth 2 (two) times daily with a meal.    Historical Provider, MD  Multiple Vitamins-Minerals (MULTIVITAMIN WITH MINERALS) tablet Take 1 tablet by mouth daily. CENTRUM SILVER MENS  50 PLUS    Historical Provider, MD  nitroGLYCERIN (NITROSTAT) 0.4 MG SL tablet PLACE 1 TABLET UNDER THE TONGUE EVERY 5 MINUTES AS NEEDED FOR CHEST PAIN 07/04/15   Sherren Mocha, MD  potassium chloride SA (K-DUR,KLOR-CON) 20 MEQ tablet Take 1 tablet (20 mEq total) by mouth 2 (two) times daily. 06/09/15   Eileen Stanford, PA-C  rosuvastatin (CRESTOR) 40 MG tablet Take 1 tablet (40 mg total) by mouth daily. 04/13/15   Biagio Borg, MD  spironolactone (ALDACTONE) 25 MG tablet Take 1  tablet (25 mg total) by mouth daily. 06/09/15   Eileen Stanford, PA-C  traMADol (ULTRAM) 50 MG tablet Take 1 tablet (50 mg total) by mouth every 6 (six) hours as needed. Patient not taking: Reported on 09/20/2015 06/27/15   Biagio Borg, MD   BP 114/60 mmHg  Pulse 58  Temp(Src) 97.8 F (36.6 C) (Oral)  Resp 22  Wt 138.8 kg  SpO2 99% Physical Exam  Constitutional: He is oriented to person, place, and time. He appears well-developed and well-nourished. No distress.  Obese   HENT:  Head: Normocephalic.  Mouth/Throat: Oropharynx is clear and moist.  Eyes: Conjunctivae are normal.  Neck: Normal range of motion. No JVD present. No tracheal deviation present.  Cardiovascular: Normal rate,  regular rhythm and intact distal pulses.   Radial pulse equal bilaterally  Pulmonary/Chest: Effort normal and breath sounds normal. No stridor. No respiratory distress. He has no wheezes. He has no rales. He exhibits no tenderness.  Abdominal: Soft. He exhibits no distension and no mass. There is no tenderness. There is no rebound and no guarding.  Musculoskeletal: Normal range of motion. He exhibits edema. He exhibits no tenderness.  1+ pitting edema bilaterally  No calf asymmetry, superficial collaterals, palpable cords,  Homans sign negative bilaterally.    Neurological: He is alert and oriented to person, place, and time.  Skin: Skin is warm. He is not diaphoretic.  Psychiatric: He has a normal mood and affect.  Nursing note and vitals reviewed.   ED Course  Procedures (including critical care time) Labs Review Labs Reviewed  BASIC METABOLIC PANEL  [place], ED    Imaging Review No results found. I have personally reviewed and evaluated these images and lab results as part of my medical decision-making.   EKG Interpretation   Date/Time:  Thursday December 15 2015 07:25:24 EDT Ventricular Rate:  53 PR Interval:    QRS Duration: 178 QT Interval:  457 QTC Calculation:  430 R Axis:   22 Text Interpretation:  Sinus rhythm Right bundle branch block No  significant change since last tracing Confirmed by Christy Gentles  MD, Elenore Rota  [phone]) on 12/15/2015 7:33:02 AM Also confirmed by Christy Gentles  MD, DONALD  [phone]), editor [place]  CCT, BEVERLY ([id number])  on 12/15/2015 7:45:31 AM      MDM   Final diagnoses:  AKI (acute kidney injury) ([place])  DOE (dyspnea on exertion)    Filed Vitals:   12/15/15 0730 12/15/15 0736  BP: 114/60 114/60  Pulse: 56 58  Temp:  97.8 F (36.6 C)  TempSrc:  Oral  Resp: 16 22  Weight:  138.8 kg  SpO2: 100% 99%    Medications  sodium chloride 0.9 % bolus 500 mL (not administered)    [name] [name] is 60 y.o. male presenting with Dyspnea on exertion and episode of chest pain. Patient does not appear to be clinically volume overloaded he has admitted to being noncompliant with his diet secondary to the holidays.  EKG is unchanged, chest x-ray without volume overload, Pro BNP and troponin are negative.   Creatinine is significantly elevated at 2.83 which is atypical for his baseline. Will need hydration with frequent rechecks of lung sounds given his history of CHF.  Case discussed with [facility] hospitalist NP Lissa Merlin who accepts admission.  Monico Blitz, PA-C 12/15/15 [place], PA-C 12/15/15 1541  Ripley Fraise, MD 12/16/15 [phone]

## 2015-12-15 NOTE — ED Notes (Signed)
Pt presents via [facility] from home with c/o chest pain when waking up this AM.  Reports pain as sharp under left breast radiating to left back, lasting approximately 5 mins.  Reports SOB x 2-3 days, increased edema to LE.  Hx: CHF, MI, stents.  Pt pain free on arrival.  Received 324 ASA by EMS.  Denies N/V/diaphoresis.  EKG unremarkable.  BP-120/60 P-60 O2-99 RA, R-16.  A x 4, NAD.

## 2015-12-15 NOTE — ED Provider Notes (Signed)
Patient seen/examined in the Emergency Department in conjunction with Midlevel Provider  Patient reports recent CP and shortness of breath.  He feels at baseline currenlty Exam : awake/alert, no distress, resting comfortably Plan: advised admission due to worsened renal failure as well as h/o CAD with recent chest pain    Ripley Fraise, MD 12/15/15 [phone]

## 2015-12-16 DIAGNOSIS — I5042 Chronic combined systolic (congestive) and diastolic (congestive) heart failure: Secondary | ICD-10-CM

## 2015-12-16 DIAGNOSIS — N179 Acute kidney failure, unspecified: Secondary | ICD-10-CM

## 2015-12-16 DIAGNOSIS — I10 Essential (primary) hypertension: Secondary | ICD-10-CM | POA: Diagnosis not present

## 2015-12-16 DIAGNOSIS — R0789 Other chest pain: Secondary | ICD-10-CM | POA: Diagnosis not present

## 2015-12-16 DIAGNOSIS — R079 Chest pain, unspecified: Secondary | ICD-10-CM | POA: Diagnosis not present

## 2015-12-16 DIAGNOSIS — Z720 Tobacco use: Secondary | ICD-10-CM

## 2015-12-16 LAB — COMPREHENSIVE METABOLIC PANEL
ALBUMIN: 3.4 g/dL — AB (ref 3.5–5.0)
ALK PHOS: 55 U/L (ref 38–126)
ALT: 17 U/L (ref 17–63)
ANION GAP: 5 (ref 5–15)
AST: 18 U/L (ref 15–41)
BILIRUBIN TOTAL: 0.2 mg/dL — AB (ref 0.3–1.2)
BUN: 27 mg/dL — AB (ref 6–20)
CALCIUM: 9.8 mg/dL (ref 8.9–10.3)
CO2: 31 mmol/L (ref 22–32)
Chloride: 106 mmol/L (ref 101–111)
Creatinine, Ser: 2.61 mg/dL — ABNORMAL HIGH (ref 0.61–1.24)
GFR calc Af Amer: 29 mL/min — ABNORMAL LOW (ref >60)
GFR calc non Af Amer: 25 mL/min — ABNORMAL LOW (ref >60)
GLUCOSE: 142 mg/dL — AB (ref 65–99)
POTASSIUM: 3.3 mmol/L — AB (ref 3.5–5.1)
SODIUM: 142 mmol/L (ref 135–145)
TOTAL PROTEIN: 6.5 g/dL (ref 6.5–8.1)

## 2015-12-16 LAB — CBC
HEMATOCRIT: 38.9 % — AB (ref 39.0–52.0)
HEMOGLOBIN: 12.7 g/dL — AB (ref 13.0–17.0)
MCH: 28.9 pg (ref 26.0–34.0)
MCHC: 32.6 g/dL (ref 30.0–36.0)
MCV: 88.4 fL (ref 78.0–100.0)
Platelets: 196 10*3/uL (ref 150–400)
RBC: 4.4 MIL/uL (ref 4.22–5.81)
RDW: 15.4 % (ref 11.5–15.5)
WBC: 7 10*3/uL (ref 4.0–10.5)

## 2015-12-16 LAB — HEMOGLOBIN A1C
Hgb A1c MFr Bld: 6 % — ABNORMAL HIGH (ref 4.8–5.6)
MEAN PLASMA GLUCOSE: 126 mg/dL

## 2015-12-16 LAB — GLUCOSE, CAPILLARY
Glucose-Capillary: 135 mg/dL — ABNORMAL HIGH (ref 65–99)
Glucose-Capillary: 102 mg/dL — ABNORMAL HIGH (ref 65–99)

## 2015-12-16 LAB — TROPONIN I: Troponin I: 0.04 ng/mL (ref <0.03)

## 2015-12-16 MED ORDER — GLIMEPIRIDE 1 MG PO TABS: 1.0000 mg | ORAL_TABLET | Freq: Every day | ORAL | Status: DC

## 2015-12-16 MED ORDER — ENOXAPARIN SODIUM 40 MG/0.4ML ~~LOC~~ SOLN
40.0000 mg | Freq: Every day | SUBCUTANEOUS | Status: DC
  Administered 2015-12-16: 40 mg via SUBCUTANEOUS
  Filled 2015-12-16: qty 0.4

## 2015-12-16 NOTE — Discharge Summary (Signed)
Physician Discharge Summary  [name] [name] DOB: Jun 27, 1955 DOA: 12/15/2015  PCP: Cathlean Cower, MD  Admit date: 12/15/2015 Discharge date: 12/16/2015  Time spent: 35 minutes  Recommendations for Outpatient Follow-up:  1. Please follow-up on renal function, he was found to have acute kidney injury likely related to diuretic therapy, on day of discharge had a creatinine of 2.6. Cardiology recommended holding diuretic therapy and following up in the office. 2. Follow up on blood pressures, his Cozaar, clonidine, amlodipine Lasix and spironolactone were stopped on discharge secondary to low blood pressures and/or acute kidney injury 3. Please follow-up on his volume status 4. Follow-up on blood sugars, due to acute renal failure his metformin was discontinued. For diabetes mellitus management he was discharged on glimepiride1 mg by mouth daily   Discharge Diagnoses:  Principal Problem:   Chest pain syndrome Active Problems:   GASTROESOPHAGEAL REFLUX DISEASE   Tobacco abuse   Ischemic cardiomyopathy- EF 50-55% June 2015   Obesity, Class III, BMI 40-49.9 (morbid obesity) (HCC)   DM2 (diabetes mellitus, type 2) (HCC)   Chronic combined systolic and diastolic CHF    CAD S/P multiple PCI   Hyperlipidemia   Peripheral neuropathy (HCC)   OSA (obstructive sleep apnea)   Acute kidney injury ([name])   HTN (hypertension)   Atypical chest pain   Discharge Condition: Stable/improved  Diet recommendation: Heart healthy  Filed Weights   12/15/15 0736 12/15/15 1137 12/16/15 0404  Weight: 138.8 kg (306 lb) 137.893 kg (304 lb) 139.39 kg (307 lb 4.8 oz)    History of present illness:  [name] [name] is a 60 y.o. male with medical history significant for CAD with prior stents, ischemic cardiomyopathy with recovered EF based on echocardiogram January 2017, morbid obesity and sleep apnea, diabetes on metformin, dyslipidemia, and ongoing tobacco abuse. Patient presents to the ER 2/2 reports of  sharp left-sided chest pain radiating to the left back lasting 5 minutes and was not typical of his previous ischemic chest pain when he had an MI (although he later told attending M.D. he thought it was typical of his prior MI). He had no other associated symptoms such as nausea diaphoresis or shortness of breath with the chest pain although he has had shortness of breath for about 2-3 days independent of recent chest pain. He is also reporting increased edema of the lower extremities but in addition reports dietary indiscretion especially over the Fourth of July holiday eating foods such as hot dogs and chips. He also endorses noncompliance with his CPAP, excessive daytime sleepiness and sitting most of the time in a recliner or with his feet dependent. He is currently chest pain-free.  Hospital Course:  [name] is a pleasant 60 year old gentleman with a past medical history of ischemic cardiomyopathy, history of coronary artery disease with percutaneous interventions, admitted to the medicine service and so 60,017 when he presented with atypical chest pain. Initial workup included a chest x-ray that did not reveal evidence of CHF or pneumonia. Labs revealed normal troponin. He was monitored overnight with continuous cardiac monitoring. Which troponins remaining stable and the 0.032 0.04 range. Labs did reveal the presence of acute kidney injury having creatinine of 2.81 on presentation. His Lasix, spironolactone and Cozaar were held. He was evaluated by cardiology on 12/16/2015. Chest pain was not felt to be of cardiac origin and further cardiac workup was not recommended at this time. Cardiology recommending holding diuretic therapy given development of acute renal failure and following up at the office with  [name] PAC. Cardiology felt he was stable for discharge from a cardiac standpoint.  Procedures:    Consultations:  Cardiology  Discharge Exam: Filed Vitals:   12/16/15 0404 12/16/15  0826  BP: 119/63   Pulse: 45 61  Temp: 97.5 F (36.4 C)   Resp: 18     General: Awake and alert, nontoxic-appearing no acute distress, has pain-free Cardiovascular: Regular rate and rhythm Respiratory: Normal respiratory efforts Abdomen: Soft nontender nondistended Extremities: 1+ pedal edema  Discharge Instructions   Discharge Instructions    (HEART FAILURE PATIENTS) Call MD:  Anytime you have any of the following symptoms: 1) 3 pound weight gain in 24 hours or 5 pounds in 1 week 2) shortness of breath, with or without a dry hacking cough 3) swelling in the hands, feet or stomach 4) if you have to sleep on extra pillows at night in order to breathe.    Complete by:  As directed      Call MD for:  difficulty breathing, headache or visual disturbances    Complete by:  As directed      Call MD for:  extreme fatigue    Complete by:  As directed      Call MD for:  hives    Complete by:  As directed      Call MD for:  persistant dizziness or light-headedness    Complete by:  As directed      Call MD for:  persistant nausea and vomiting    Complete by:  As directed      Call MD for:  redness, tenderness, or signs of infection (pain, swelling, redness, odor or green/yellow discharge around incision site)    Complete by:  As directed      Call MD for:  severe uncontrolled pain    Complete by:  As directed      Call MD for:  temperature >100.4    Complete by:  As directed      Call MD for:    Complete by:  As directed      Diet - low sodium heart healthy    Complete by:  As directed      Increase activity slowly    Complete by:  As directed           Current Discharge Medication List    START taking these medications   Details  glimepiride (AMARYL) 1 MG tablet Take 1 tablet (1 mg total) by mouth daily with breakfast. Qty: 30 tablet, Refills: 1      CONTINUE these medications which have NOT CHANGED   Details  aspirin 81 MG tablet Take 81 mg by mouth daily.    carvedilol  (COREG) 6.25 MG tablet Take 1 tablet (6.25 mg total) by mouth 2 (two) times daily with a meal. Qty: 60 tablet, Refills: 6    clopidogrel (PLAVIX) 75 MG tablet Take 1 tablet (75 mg total) by mouth daily. Qty: 30 tablet, Refills: 6    escitalopram (LEXAPRO) 10 MG tablet TAKE 1 TABLET BY MOUTH EVERY DAY Qty: 90 tablet, Refills: 1    famotidine (PEPCID) 40 MG tablet Take 1 tablet (40 mg total) by mouth every 12 (twelve) hours. Qty: 60 tablet, Refills: 10    isosorbide mononitrate (IMDUR) 30 MG 24 hr tablet TAKE 3 TABLETS BY MOUTH DAILY Qty: 270 tablet, Refills: 3    Multiple Vitamins-Minerals (MULTIVITAMIN WITH MINERALS) tablet Take 1 tablet by mouth daily. CENTRUM SILVER MENS  50 PLUS  rosuvastatin (CRESTOR) 40 MG tablet Take 1 tablet (40 mg total) by mouth daily. Qty: 90 tablet, Refills: 3    traMADol (ULTRAM) 50 MG tablet Take 1 tablet (50 mg total) by mouth every 6 (six) hours as needed. Qty: 120 tablet, Refills: 2    gabapentin (NEURONTIN) 300 MG capsule TAKE 1 CAPSULE(300 MG) BY MOUTH AT BEDTIME Qty: 30 capsule, Refills: 3    nitroGLYCERIN (NITROSTAT) 0.4 MG SL tablet PLACE 1 TABLET UNDER THE TONGUE EVERY 5 MINUTES AS NEEDED FOR CHEST PAIN Qty: 25 tablet, Refills: 5      STOP taking these medications     amLODipine (NORVASC) 10 MG tablet      cloNIDine (CATAPRES) 0.3 MG tablet      furosemide (LASIX) 80 MG tablet      losartan (COZAAR) 100 MG tablet      metFORMIN (GLUCOPHAGE-XR) 500 MG 24 hr tablet      potassium chloride SA (K-DUR,KLOR-CON) 20 MEQ tablet      spironolactone (ALDACTONE) 25 MG tablet        Allergies  Allergen Reactions  . Penicillins Other (See Comments)    Unknown childhood reaction   Follow-up Information    Follow up with Cathlean Cower, MD In 2 weeks.   Specialties:  Internal Medicine, Radiology   Contact information:   [place] [address] [phone]       Follow up with [name], PA-C In 1 week.    Specialties:  Cardiology, Physician Assistant   Contact information:   [address] [place] [address] [phone]        The results of significant diagnostics from this hospitalization (including imaging, microbiology, ancillary and laboratory) are listed below for reference.    Significant Diagnostic Studies: Dg Chest 2 View  12/15/2015  CLINICAL DATA:  Shortness of breath and bilateral lower extremity edema for the past 2 days, chest pain this morning ; history of CHF, coronary artery disease and stent placement ; diabetes, current smoker. EXAM: CHEST  2 VIEW COMPARISON:  Portable chest x-ray of June 06, 2015 FINDINGS: The lungs are adequately inflated. The interstitial markings are coarse. There is no alveolar infiltrate. There is no pleural effusion or pneumothorax. The heart is normal in size. The pulmonary vascularity is not engorged. The mediastinum is normal in width. The observed bony thorax is unremarkable. IMPRESSION: 1. No evidence of CHF nor pneumonia. 2. Coarse interstitial lung markings likely reflect the patient's smoking history. Scarring at the right lung base, stable. Electronically Signed   By: David  Martinique M.D.   On: 12/15/2015 08:15    Microbiology: No results found for this or any previous visit (from the past 240 hour(s)).   Labs: Basic Metabolic Panel:  Recent Labs Lab 12/15/15 0747 12/15/15 1957 12/16/15 0300  NA 141 141 142  K 3.6 3.5 3.3*  CL 107 105 106  CO2 26 29 31   GLUCOSE 101* 109* 142*  BUN 31* 30* 27*  CREATININE 2.83* 2.81* 2.61*  CALCIUM 10.2 10.0 9.8   Liver Function Tests:  Recent Labs Lab 12/16/15 0300  AST 18  ALT 17  ALKPHOS 55  BILITOT 0.2*  PROT 6.5  ALBUMIN 3.4*   No results for input(s): LIPASE, AMYLASE in the last 168 hours. No results for input(s): AMMONIA in the last 168 hours. CBC:  Recent Labs Lab 12/15/15 0747 12/16/15 0300  WBC 7.6 7.0  HGB 12.7* 12.7*  HCT 39.4 38.9*  MCV 89.1  88.4  PLT 212 196   Cardiac Enzymes:  Recent Labs Lab 12/15/15 1326 12/15/15 1957 12/16/15 0300  TROPONINI 0.03* 0.04* 0.04*   BNP: BNP (last 3 results)  Recent Labs  12/15/15 0747  BNP 34.2    ProBNP (last 3 results) No results for input(s): PROBNP in the last 8760 hours.  CBG:  Recent Labs Lab 12/15/15 1146 12/15/15 1656 12/15/15 2028 12/16/15 0728 12/16/15 1131  GLUCAP 138* 109* 116* 135* 102*       Signed:  Kelvin Cellar MD.  [facility] 12/16/2015, 12:44 PM

## 2015-12-16 NOTE — Consult Note (Signed)
Reason for Consult:   Chest pain  Requesting Physician: [facility] Primary Cardiologist Dr [name] S. [name]  HPI:   60 y/o male with a history of coronary artery disease: S/p multiple PCI procedures in the setting of ACS/STEMI. Last LHC in 10/15 with stable anatomy. He was evaluated in 9/16 with CP and neg enzymes.Myoview in Jan 2017 showed scar but no ischemia. Echo in Jan 2017 showed an EF of 40-45%.He says he has been doing well, no chest pain and B/P under good control. He told me he is compliant with his C-pap.  He typically presents with elevated CEs when he has progression of disease.           He presented to the ED 12/15/15 with complaints of sharp, localized Lt lateral chest wall pain and swelling in his legs. His Troponin are slightly elavted but flat and he also has acute renal insufficiency with a SCr of 2.83. In Jan 2017 his SCr was 1.0-1.3.  BNP is 34, no CHF on CXR. He sleeps in a chair at home secondary to back pain- no orthopnea.   PMHx:  Past Medical History  Diagnosis Date  . HTN (hypertension)   . CAD (coronary artery disease)     a. s/p PCI/BMS pRCA 2002; b. PCI pRCA & DES p/m RCA 2006; c. PCI/DES OM4 2007; d. PCI/CBA to RCA for ISR 10/2006; e.08/2012 STEMI/Cath/PCI: LM nl, LAD95-46m (3.0x20 Promus Prem DES), 95/95 ap LAD (1.0-1.90mm), LCX 30p 95d (sm), LPL1 70-80p, patent stent, LPL2 nl, RCA 30p, 40/70 ISR, EF 35-40%.  . Ischemic cardiomyopathy     a. EF 40%; improved to normal;  b. 08/2012 Echo: EF 50-55%, mod LVH.  [name] GERD (gastroesophageal reflux disease)   . Tobacco abuse   . Obesity   . OSA (obstructive sleep apnea)     Does not use CPAP as of 05/2011  . Depression   . History of pneumonia   . Diabetes mellitus ([place])     a. A1c 8.8 08/2012->Metformin initiated. => b. A1c (9/14): 6.6  . Hyperlipidemia   . Benign essential HTN 03/09/2015  . NSTEMI (non-ST elevated myocardial infarction) [facility])     Past Surgical History  Procedure Laterality  Date  . Coronary stent placement  2009  . Coronary angioplasty with stent placement    . Left heart catheterization with coronary angiogram N/A 08/31/2012    Procedure: LEFT HEART CATHETERIZATION WITH CORONARY ANGIOGRAM;  Surgeon: Burnell Blanks, MD;  Location: [facility] CATH LAB;  Service: Cardiovascular;  Laterality: N/A;  . Percutaneous coronary stent intervention (pci-s) Right 08/31/2012    Procedure: PERCUTANEOUS CORONARY STENT INTERVENTION (PCI-S);  Surgeon: Burnell Blanks, MD;  Location: [facility] CATH LAB;  Service: Cardiovascular;  Laterality: Right;  . Left heart catheterization with coronary angiogram N/A 11/16/2013    Procedure: LEFT HEART CATHETERIZATION WITH CORONARY ANGIOGRAM;  Surgeon: Blane Ohara, MD;  Location: [facility] CATH LAB;  Service: Cardiovascular;  Laterality: N/A;  . Percutaneous coronary stent intervention (pci-s)  11/16/2013    Procedure: PERCUTANEOUS CORONARY STENT INTERVENTION (PCI-S);  Surgeon: Blane Ohara, MD;  Location: [facility] CATH LAB;  Service: Cardiovascular;;  . Left heart catheterization with coronary angiogram N/A 03/15/2014    Procedure: LEFT HEART CATHETERIZATION WITH CORONARY ANGIOGRAM;  Surgeon: Peter M Martinique, MD;  Location: [facility] CATH LAB;  Service: Cardiovascular;  Laterality: N/A;    SOCHx:  reports that he has been smoking Cigarettes.  He has a 15 pack-year  smoking history. He has never used smokeless tobacco. He reports that he does not drink alcohol or use illicit drugs.  FAMHx: Family History  Problem Relation Age of Onset  . Coronary artery disease    . Depression Mother   . Cancer Mother     ovarian  . Hypertension Mother     Died, 42  . Other Father     motor vehicle accident  . Heart attack Neg Hx   . Stroke Neg Hx     ALLERGIES: Allergies  Allergen Reactions  . Penicillins Other (See Comments)    Unknown childhood reaction    ROS: Review of Systems: General: negative for chills, fever, night sweats or weight changes.    Cardiovascular: negative for dyspnea on exertion, edema, orthopnea, palpitations, paroxysmal nocturnal dyspnea or shortness of breath HEENT: negative for any visual disturbances, blindness, glaucoma Dermatological: negative for rash Respiratory: negative for cough, hemoptysis, or wheezing Urologic: negative for hematuria or dysuria Abdominal: negative for nausea, vomiting, diarrhea, bright red blood per rectum, melena, or hematemesis Neurologic: negative for visual changes, syncope, or dizziness Musculoskeletal: negative for back pain, joint pain, or swelling Psych: cooperative and appropriate All other systems reviewed and are otherwise negative except as noted above.   HOME MEDICATIONS: Prior to Admission medications   Medication Sig Start Date End Date Taking? Authorizing Provider  amLODipine (NORVASC) 10 MG tablet TAKE 1 TABLET BY MOUTH EVERY DAY 06/17/15  Yes Biagio Borg, MD  aspirin 81 MG tablet Take 81 mg by mouth daily.   Yes Historical Provider, MD  carvedilol (COREG) 6.25 MG tablet Take 1 tablet (6.25 mg total) by mouth 2 (two) times daily with a meal. 06/09/15  Yes Eileen Stanford, PA-C  cloNIDine (CATAPRES) 0.3 MG tablet Take 1 tablet (0.3 mg total) by mouth 2 (two) times daily. 04/21/15  Yes Liliane Shi, PA-C  clopidogrel (PLAVIX) 75 MG tablet Take 1 tablet (75 mg total) by mouth daily. 06/09/15  Yes Eileen Stanford, PA-C  escitalopram (LEXAPRO) 10 MG tablet TAKE 1 TABLET BY MOUTH EVERY DAY 06/29/15  Yes Biagio Borg, MD  famotidine (PEPCID) 40 MG tablet Take 1 tablet (40 mg total) by mouth every 12 (twelve) hours. 06/28/15  Yes Imogene Burn, PA-C  furosemide (LASIX) 80 MG tablet Take 80 mg by mouth 2 (two) times daily. 01/28/15  Yes Historical Provider, MD  gabapentin (NEURONTIN) 100 MG capsule Take 100 mg by mouth 2 (two) times daily.   Yes Historical Provider, MD  isosorbide mononitrate (IMDUR) 30 MG 24 hr tablet TAKE 3 TABLETS BY MOUTH DAILY 09/27/15  Yes Scott T  [name], PA-C  losartan (COZAAR) 100 MG tablet Take 1 tablet (100 mg total) by mouth daily. 12/10/14  Yes Sherren Mocha, MD  metFORMIN (GLUCOPHAGE-XR) 500 MG 24 hr tablet Take 1,000 mg by mouth daily with breakfast.   Yes Historical Provider, MD  Multiple Vitamins-Minerals (MULTIVITAMIN WITH MINERALS) tablet Take 1 tablet by mouth daily. CENTRUM SILVER MENS  50 PLUS   Yes Historical Provider, MD  potassium chloride SA (K-DUR,KLOR-CON) 20 MEQ tablet Take 1 tablet (20 mEq total) by mouth 2 (two) times daily. 06/09/15  Yes Eileen Stanford, PA-C  rosuvastatin (CRESTOR) 40 MG tablet Take 1 tablet (40 mg total) by mouth daily. 04/13/15  Yes Biagio Borg, MD  spironolactone (ALDACTONE) 25 MG tablet Take 1 tablet (25 mg total) by mouth daily. 06/09/15  Yes Eileen Stanford, PA-C  traMADol (ULTRAM) 50 MG tablet Take  1 tablet (50 mg total) by mouth every 6 (six) hours as needed. Patient taking differently: Take 50 mg by mouth every 4 (four) hours as needed (pain).  06/27/15  Yes Biagio Borg, MD  gabapentin (NEURONTIN) 300 MG capsule TAKE 1 CAPSULE(300 MG) BY MOUTH AT BEDTIME Patient not taking: Reported on 12/15/2015 12/12/15   Lyndal Pulley, DO  nitroGLYCERIN (NITROSTAT) 0.4 MG SL tablet PLACE 1 TABLET UNDER THE TONGUE EVERY 5 MINUTES AS NEEDED FOR CHEST PAIN 07/04/15   Sherren Mocha, MD    HOSPITAL MEDICATIONS: I have reviewed the patient's current medications.  VITALS: Blood pressure 119/63, pulse 45, temperature 97.5 F (36.4 C), temperature source Oral, resp. rate 18, height 6\' 1"  (1.854 m), weight 307 lb 4.8 oz (139.39 kg), SpO2 100 %.  PHYSICAL EXAM: General appearance: alert, cooperative, no distress and moderately obese Neck: no carotid bruit and no JVD Lungs: clear to auscultation bilaterally Heart: regular rate and rhythm Abdomen: obese, non tender Extremities: 1+ ankle edma bilaterally Pulses: 2+ and symmetric Skin: Skin color, texture, turgor normal. No rashes or lesions Neurologic:  Grossly normal  LABS: Results for orders placed or performed during the hospital encounter of 12/15/15 (from the past 24 hour(s))  Basic metabolic panel     Status: Abnormal   Collection Time: 12/15/15  7:47 AM  Result Value Ref Range   Sodium 141 135 - 145 mmol/L   Potassium 3.6 3.5 - 5.1 mmol/L   Chloride 107 101 - 111 mmol/L   CO2 26 22 - 32 mmol/L   Glucose, Bld 101 (H) 65 - 99 mg/dL   BUN 31 (H) 6 - 20 mg/dL   Creatinine, Ser 2.83 (H) 0.61 - 1.24 mg/dL   Calcium 10.2 8.9 - 10.3 mg/dL   GFR calc non Af Amer 23 (L) >60 mL/min   GFR calc Af Amer 27 (L) >60 mL/min   Anion gap 8 5 - 15  CBC     Status: Abnormal   Collection Time: 12/15/15  7:47 AM  Result Value Ref Range   WBC 7.6 4.0 - 10.5 K/uL   RBC 4.42 4.22 - 5.81 MIL/uL   Hemoglobin 12.7 (L) 13.0 - 17.0 g/dL   HCT 39.4 39.0 - 52.0 %   MCV 89.1 78.0 - 100.0 fL   MCH 28.7 26.0 - 34.0 pg   MCHC 32.2 30.0 - 36.0 g/dL   RDW 15.6 (H) 11.5 - 15.5 %   Platelets 212 150 - 400 K/uL  Brain natriuretic peptide     Status: None   Collection Time: 12/15/15  7:47 AM  Result Value Ref Range   B Natriuretic Peptide 34.2 0.0 - 100.0 pg/mL  I-stat troponin, ED     Status: None   Collection Time: 12/15/15  7:50 AM  Result Value Ref Range   Troponin i, poc 0.03 0.00 - 0.08 ng/mL   Comment 3          Hemoglobin A1c     Status: Abnormal   Collection Time: 12/15/15 10:10 AM  Result Value Ref Range   Hgb A1c MFr Bld 6.0 (H) 4.8 - 5.6 %   Mean Plasma Glucose 126 mg/dL  Glucose, capillary     Status: Abnormal   Collection Time: 12/15/15 11:46 AM  Result Value Ref Range   Glucose-Capillary 138 (H) 65 - 99 mg/dL   Comment 1 Notify RN    Comment 2 Document in Chart   Troponin I (q 6hr x 3)  Status: Abnormal   Collection Time: 12/15/15  1:26 PM  Result Value Ref Range   Troponin I 0.03 (HH) <0.03 ng/mL  Glucose, capillary     Status: Abnormal   Collection Time: 12/15/15  4:56 PM  Result Value Ref Range   Glucose-Capillary 109 (H)  65 - 99 mg/dL  Basic metabolic panel     Status: Abnormal   Collection Time: 12/15/15  7:57 PM  Result Value Ref Range   Sodium 141 135 - 145 mmol/L   Potassium 3.5 3.5 - 5.1 mmol/L   Chloride 105 101 - 111 mmol/L   CO2 29 22 - 32 mmol/L   Glucose, Bld 109 (H) 65 - 99 mg/dL   BUN 30 (H) 6 - 20 mg/dL   Creatinine, Ser 2.81 (H) 0.61 - 1.24 mg/dL   Calcium 10.0 8.9 - 10.3 mg/dL   GFR calc non Af Amer 23 (L) >60 mL/min   GFR calc Af Amer 27 (L) >60 mL/min   Anion gap 7 5 - 15  Troponin I (q 6hr x 3)     Status: Abnormal   Collection Time: 12/15/15  7:57 PM  Result Value Ref Range   Troponin I 0.04 (HH) <0.03 ng/mL  Glucose, capillary     Status: Abnormal   Collection Time: 12/15/15  8:28 PM  Result Value Ref Range   Glucose-Capillary 116 (H) 65 - 99 mg/dL  Troponin I (q 6hr x 3)     Status: Abnormal   Collection Time: 12/16/15  3:00 AM  Result Value Ref Range   Troponin I 0.04 (HH) <0.03 ng/mL  Comprehensive metabolic panel     Status: Abnormal   Collection Time: 12/16/15  3:00 AM  Result Value Ref Range   Sodium 142 135 - 145 mmol/L   Potassium 3.3 (L) 3.5 - 5.1 mmol/L   Chloride 106 101 - 111 mmol/L   CO2 31 22 - 32 mmol/L   Glucose, Bld 142 (H) 65 - 99 mg/dL   BUN 27 (H) 6 - 20 mg/dL   Creatinine, Ser 2.61 (H) 0.61 - 1.24 mg/dL   Calcium 9.8 8.9 - 10.3 mg/dL   Total Protein 6.5 6.5 - 8.1 g/dL   Albumin 3.4 (L) 3.5 - 5.0 g/dL   AST 18 15 - 41 U/L   ALT 17 17 - 63 U/L   Alkaline Phosphatase 55 38 - 126 U/L   Total Bilirubin 0.2 (L) 0.3 - 1.2 mg/dL   GFR calc non Af Amer 25 (L) >60 mL/min   GFR calc Af Amer 29 (L) >60 mL/min   Anion gap 5 5 - 15  CBC     Status: Abnormal   Collection Time: 12/16/15  3:00 AM  Result Value Ref Range   WBC 7.0 4.0 - 10.5 K/uL   RBC 4.40 4.22 - 5.81 MIL/uL   Hemoglobin 12.7 (L) 13.0 - 17.0 g/dL   HCT 38.9 (L) 39.0 - 52.0 %   MCV 88.4 78.0 - 100.0 fL   MCH 28.9 26.0 - 34.0 pg   MCHC 32.6 30.0 - 36.0 g/dL   RDW 15.4 11.5 - 15.5 %    Platelets 196 150 - 400 K/uL    EKG: NSR, SB, RBBB  IMAGING: Dg Chest 2 View  12/15/2015  CLINICAL DATA:  Shortness of breath and bilateral lower extremity edema for the past 2 days, chest pain this morning ; history of CHF, coronary artery disease and stent placement ; diabetes, current smoker. EXAM: CHEST  2  VIEW COMPARISON:  Portable chest x-ray of June 06, 2015 FINDINGS: The lungs are adequately inflated. The interstitial markings are coarse. There is no alveolar infiltrate. There is no pleural effusion or pneumothorax. The heart is normal in size. The pulmonary vascularity is not engorged. The mediastinum is normal in width. The observed bony thorax is unremarkable. IMPRESSION: 1. No evidence of CHF nor pneumonia. 2. Coarse interstitial lung markings likely reflect the patient's smoking history. Scarring at the right lung base, stable. Electronically Signed   By: David  Martinique M.D.   On: 12/15/2015 08:15    IMPRESSION: Principal Problem:   Chest pain syndrome Active Problems:   CAD S/P multiple PCI   Acute kidney injury ([place])   Ischemic cardiomyopathy- EF 50-55% June 2015   Obesity, Class III, BMI 40-49.9 (morbid obesity) ([place])   DM2 (diabetes mellitus, type 2) (H[facility])   Chronic combined systolic and diastolic CHF    HTN (hypertension)   Atypical chest pain   GASTROESOPHAGEAL REFLUX DISEASE   Tobacco abuse   Hyperlipidemia   Peripheral neuropathy ([facility])   OSA (obstructive sleep apnea)   RECOMMENDATION: Not sure he needs any further work up for ischemia and he does not appear to be in CHF.   ARB, Lasix, Aldactone, Glucophage on hold- follow renal function.  Time Spent Directly with Patient: 61 minutes  Kerin Ransom, [place] beeper 12/16/2015, 7:30 AM   Agree with note written by Kerin Ransom PAC  Pt with known CAD s/p mult interventions. Mod LV dysf, CRI, OSA and dietary indiscretion. Admitted with fleeting atyp CP and BLE edema. He admits to eating 2 hotdogs on  7/4. Enz neg, EKG w/o acute changes. Appears to have diuresed. Exam benign. Edema improved. No further w/u necessary at this time. Follow up with [name] [facility] after DC home. Thanks.   Quay Burow 12/16/2015 10:46 AM

## 2015-12-16 NOTE — Consult Note (Signed)
   [facility] CM Inpatient Consult   12/16/2015  [name] [name] [birth year] [id number]  Referral received for restart of services from inpatient RNCM.  Patient was assessed for [name] for community services. Patient was previously active with [facility].  Patient is a 60 year old gentleman with a past medical history of ischemic cardiomyopathy, history of coronary artery disease with percutaneous interventions and he presented with atypical chest pain.  Chest pain was not felt to be of cardiac origin and further cardiac workup was not recommended at this time. Cardiology recommending holding diuretic therapy given development of acute renal failure and following up at the office with [name] PAC. Cardiology felt he was stable for discharge from a cardiac standpoint.  Met with patient at bedside regarding being restarted with [facility] services. Consent form signed and folder with [place] [facility] information given.  Patient verbalizes ongoing needs for transportation in the community and a desire to become active and access his benefits for [employer] program.  He consents to care management for education.   Of note, [facility] Care Management services does not replace or interfere with any services that are arranged by inpatient case management or social work. For additional questions or referrals please contact:  Natividad Brood, RN BSN [facility] Liaison  [phone] business mobile phone Toll free office [phone]

## 2015-12-16 NOTE — Care Management Note (Addendum)
Case Management Note  Patient Details  Name: [name] [name] MRN: [id number] Date of Birth: 1955-06-29  Subjective/Objective:         AKI, DM, chest pain           Action/Plan: Discharge Planning: AVS reviewed:  NCM spoke to pt and states he is currently retired and receiving disability. States he lives at home alone. He depends of family and public transportation currently to ge to his appts. States he has tried to stop smoking in the past and has discussed with his PCP. Plans to discuss smoking cessation program with PCP. Pt states he has a sedentary lifestyle but plans to try to increase his activity. Has a cane at home. Can afford his medications. NCM explained importance of taking medication as prescribed. Pt was in the [facility] program, but states he did not feel he was giving the program an opportunity to work for him. He is interested in [facility]iaison with referral.   PCP - Cathlean Cower MD   Expected Discharge Date:  12/16/2015               Expected Discharge Plan:  Home/Self Care  In-House Referral:  NA  Discharge planning Services  CM Consult  Post Acute Care Choice:  NA Choice offered to:  NA  DME Arranged:  N/A DME Agency:  NA  HH Arranged:  NA HH Agency:  NA  Status of Service:  Completed, signed off  If discussed at [place] of Stay Meetings, dates discussed:    Additional Comments:  Erenest Rasher, RN 12/16/2015, 12:48 PM

## 2015-12-16 NOTE — Care Management Obs Status (Signed)
[place] NOTIFICATION   Patient Details  Name: [name] [name] MRN: [id number] Date of Birth: 1955/11/12   Medicare Observation Status Notification Given:  Yes    Erenest Rasher, RN 12/16/2015, 12:27 PM

## 2015-12-19 ENCOUNTER — Other Ambulatory Visit: Payer: Self-pay

## 2015-12-19 NOTE — Patient Outreach (Signed)
Unsuccessful attempt made to contact patient for transition of care. HIPPA compliant message left with this RNCM's contact information  Plan: Make another attempt to contact patient later this week.

## 2015-12-20 ENCOUNTER — Other Ambulatory Visit: Payer: Self-pay

## 2015-12-21 ENCOUNTER — Ambulatory Visit: Payer: Commercial Managed Care - HMO

## 2015-12-21 ENCOUNTER — Encounter: Payer: Commercial Managed Care - HMO | Admitting: Physical Medicine & Rehabilitation

## 2015-12-21 NOTE — Patient Outreach (Signed)
Initial telephone contact with patient for assessment of community care coordination needs. Patient identified himself by providing date of birth and address.  Patient was agreeable to this initial assessment. RNCM provided a brief description of [facility]'s Programs and disciplines involved.  Patient and RNCM collaborated to form his care management care plan.  Findings during this telephone contact: Patient is without a scale to weigh daily-[facility] will provided scale from Heart Failure Management Program.  Patient advised scale will be delivered to his address by [employer] or [employer].  Patient has knowledge deficit related to heart failure, management. Patient states he has been told (by someone he could not remember) his disability benefits would be discontinued within the next 6 months and he would be without an income.  RNCM and patient agreed to discuss further during our initial  home visit.    Patient lives alone and is independent with ADLS, IADLs. Patient has an automobile, however, the autombiles is in disrepair at this time.  Patient and RNCM agreed to home visit next week for HF Education

## 2015-12-23 ENCOUNTER — Telehealth: Payer: Self-pay | Admitting: Cardiovascular Disease

## 2015-12-23 NOTE — Telephone Encounter (Signed)
Received direct call from operator and spoke with pt. He reports he woke up with shortness of breath this AM. States it is not severe at this time but he feels it "coming on." He has been weighing daily. Weight today 303.4 lbs. 7/13-298.1 lbs, 7/12-297.0 lbs, 7/11-297.8 lbs States was recently in hospital and fluid medication stopped due to kidney function. He reports he has increased fluid intake. Drank 4 bottles of water yesterday (16 oz each). He states he was not told in hospital how much fluid he should drink daily. He always sleeps in a recliner. Feet are dependent. He reports lower legs are swollen. This is worse this morning. Trying to watch salt but did eat broccoli and cheese casserole yesterday. Will review with provider in office. Pt instructed to go to ED if symptoms worsen.

## 2015-12-23 NOTE — Telephone Encounter (Signed)
Reviewed with Dr. Marlou Porch. Pt should resume previous Lasix dose--80 mg by mouth twice daily and potassium dose-20 meq twice daily. Restrict fluids to 1.5 liters daily. Keep appt with [name], PA on 7/18  and 7/20 with Dr. [name]. I spoke with pt and gave him these instructions. He states he does not need new prescription sent in at this time.

## 2015-12-23 NOTE — Telephone Encounter (Signed)
Pt c/o swelling: STAT is pt has developed SOB within 24 hours  1. How long have you been experiencing swelling? This morning- 7/14  2. Where is the swelling located? Both feet up to knee   3.  Are you currently taking a "fluid pill"? Yes- stated that hosp has held   4.  Are you currently SOB? Yes   5.  Have you traveled recently? No

## 2015-12-25 ENCOUNTER — Other Ambulatory Visit: Payer: Self-pay | Admitting: Internal Medicine

## 2015-12-26 NOTE — Progress Notes (Signed)
Cardiology Office Note:    Date:  12/27/2015   ID:  [name] [name], DOB [birth year], MRN [id number]  PCP:  Cathlean Cower, MD  Cardiologist: Dr. Sherren Mocha / [name], PA-C  Sleep: Dr. Fransico Him   Referring MD: Biagio Borg, MD   Chief Complaint  Patient presents with  . Hospitalization Follow-up    admx with chest pain    History of Present Illness:    [name] [name] is a 60 y.o. male prior patient of mine from [place] with a hx of CAD, s/p PCI to the RCA and PL1, cutting balloon angioplasty secondary to in-stent restenosis of the RCA in 5/08, ischemic cardiomyopathy with previous EF 40% (improved to normal in the past), diastolic CHF, HTN, HL, OSA.   Admitted 08/2012 with an anterior STEMI treated with a Promus DES to the LAD. He had diffuse residual disease and medical therapy was recommended. EF was 35-40%. Follow up demonstrated improved LVF with EF 50-55%.   Admitted 11/2013 with NSTEMI. Severe stenosis OM1 treated with PCI (2.75x15 mm Xience Alpine DES).  Admitted 03/2014 with NSTEMI. LHC demonstrated severe disease in the small distal LCx and OM2, moderate disease in the mid RCA and distal LAD. This was not changed significantly since his prior study and medical therapy was continued. He had BB reduced 2/2 bradycardia.   Admitted 9/16 with chest pain. D-dimer was negative. Troponin levels remained normal. Echocardiogram demonstrated normal LV function.   Admitted 12/16 with chest pain and elevated troponin levels. Troponin levels were mildly elevated without clear trend. This occurred in the setting of hypertensive urgency. Therefore, this was felt to be demand ischemia. Outpatient stress testing was arranged and was intermediate risk with EF 35% and prior inferior lateral infarct but no ischemia. Follow-up echo in 1/17 demonstrated EF 45-50% with inferior akinesis. Medical therapy was continued for CAD.   Last seen by Dr. [name] 09/12/15.  Admitted 7/6-7/7 with  chest pain.  CEs were minimally elevated without clear trend (0.03, 0.04, 0.04).  His Creatinine was elevated at 2.81.  Furosemide, Spironolactone, Losartan were held.  DC Creatinine was 2.61.  Returns for FU.  He is here alone.  He called in last week with weight gain and increased dyspnea.  His Lasix was resumed.  He feels better.  Breathing is improved.  He denies orthopnea, PND. LE edema is stable.  He notes continued episodes of chest pain.  This is not like his prior angina. He denies exertional chest pain.  He notes some dysphagia, chest pain assoc with meals.  Of note, he continued taking Losartan after DC from the hospital.     Past Medical History  Diagnosis Date  . HTN (hypertension)   . CAD (coronary artery disease)     a. s/p PCI/BMS pRCA 2002; b. PCI pRCA & DES p/m RCA 2006; c. PCI/DES OM4 2007; d. PCI/CBA to RCA for ISR 10/2006; e.08/2012 STEMI/Cath/PCI:  LAD 95% >> PCI: 3.0x20 Promus Prem DES  //  f. NSTEMI 10/15 >> LHC: mLAD stent ok, dLAD 70, OM1 CTO, OM2 stent ok, dOM2 90, OM3 30-40, dLCx 90, p-mRCA stent ok, mRCA stent ok w/ 60-70 ISR, EF 50% >> med Rx  . Ischemic cardiomyopathy     a. EF 40%; improved to normal;  b. 08/2012 Echo: EF 50-55%, mod LVH.//  c. Echo 9/16: Inf HK, mild LVH, EF 55%, mild LAE, normal RVF, mild RAE, PASP 35 mmHg  //  d. Echo 1/17: EF 45-50%, inferior HK, mild BAE,  PASP 33 mmHg  . GERD (gastroesophageal reflux disease)   . Tobacco abuse   . Obesity   . OSA (obstructive sleep apnea)     Does not use CPAP as of 05/2011  . Depression   . History of pneumonia   . Diabetes mellitus ([place])     a. A1c 8.8 08/2012->Metformin initiated. => b. A1c (9/14): 6.6  . Hyperlipidemia   . NSTEMI (non-ST elevated myocardial infarction) ([place])   . Chronic combined systolic and diastolic CHF (congestive heart failure) ([place])     a. Myoview 1/17: EF 35%  //  b. Echo 1/17 EF 45-50%  . History of nuclear stress test     a. Myoview 1/17: EF 35%, fixed inferior lateral defect  consistent with infarct, no ischemia, intermediate risk    Past Surgical History  Procedure Laterality Date  . Coronary stent placement  2009  . Coronary angioplasty with stent placement    . Left heart catheterization with coronary angiogram N/A 08/31/2012    Procedure: LEFT HEART CATHETERIZATION WITH CORONARY ANGIOGRAM;  Surgeon: Burnell Blanks, MD;  Location: [facility] CATH LAB;  Service: Cardiovascular;  Laterality: N/A;  . Percutaneous coronary stent intervention (pci-s) Right 08/31/2012    Procedure: PERCUTANEOUS CORONARY STENT INTERVENTION (PCI-S);  Surgeon: Burnell Blanks, MD;  Location: [facility] CATH LAB;  Service: Cardiovascular;  Laterality: Right;  . Left heart catheterization with coronary angiogram N/A 11/16/2013    Procedure: LEFT HEART CATHETERIZATION WITH CORONARY ANGIOGRAM;  Surgeon: Blane Ohara, MD;  Location: [facility] CATH LAB;  Service: Cardiovascular;  Laterality: N/A;  . Percutaneous coronary stent intervention (pci-s)  11/16/2013    Procedure: PERCUTANEOUS CORONARY STENT INTERVENTION (PCI-S);  Surgeon: Blane Ohara, MD;  Location: [facility] CATH LAB;  Service: Cardiovascular;;  . Left heart catheterization with coronary angiogram N/A 03/15/2014    Procedure: LEFT HEART CATHETERIZATION WITH CORONARY ANGIOGRAM;  Surgeon: Peter M Martinique, MD;  Location: [facility] CATH LAB;  Service: Cardiovascular;  Laterality: N/A;    Current Medications: Outpatient Prescriptions Prior to Visit  Medication Sig Dispense Refill  . aspirin 81 MG tablet Take 81 mg by mouth daily.    . carvedilol (COREG) 6.25 MG tablet Take 1 tablet (6.25 mg total) by mouth 2 (two) times daily with a meal. 60 tablet 6  . clopidogrel (PLAVIX) 75 MG tablet Take 1 tablet (75 mg total) by mouth daily. 30 tablet 6  . escitalopram (LEXAPRO) 10 MG tablet TAKE 1 TABLET BY MOUTH EVERY DAY 90 tablet 1  . gabapentin (NEURONTIN) 300 MG capsule TAKE 1 CAPSULE(300 MG) BY MOUTH AT BEDTIME 30 capsule 3  . glimepiride (AMARYL) 1 MG tablet  Take 1 tablet (1 mg total) by mouth daily with breakfast. 30 tablet 1  . isosorbide mononitrate (IMDUR) 30 MG 24 hr tablet TAKE 3 TABLETS BY MOUTH DAILY 270 tablet 3  . Multiple Vitamins-Minerals (MULTIVITAMIN WITH MINERALS) tablet Take 1 tablet by mouth daily. CENTRUM SILVER MENS  50 PLUS    . nitroGLYCERIN (NITROSTAT) 0.4 MG SL tablet PLACE 1 TABLET UNDER THE TONGUE EVERY 5 MINUTES AS NEEDED FOR CHEST PAIN 25 tablet 5  . rosuvastatin (CRESTOR) 40 MG tablet Take 1 tablet (40 mg total) by mouth daily. 90 tablet 3  . famotidine (PEPCID) 40 MG tablet Take 1 tablet (40 mg total) by mouth every 12 (twelve) hours. 60 tablet 10  . traMADol (ULTRAM) 50 MG tablet Take 1 tablet (50 mg total) by mouth every 6 (six) hours as needed. (Patient not taking:  Reported on 12/27/2015) 120 tablet 2   No facility-administered medications prior to visit.      Allergies:   Penicillins   Social History   Social History  . Marital Status: Single    Spouse Name: N/A  . Number of Children: 0  . Years of Education: N/A   Occupational History  . retired    Social History Main Topics  . Smoking status: Current Every Day Smoker -- 0.50 packs/day for 30 years    Types: Cigarettes  . Smokeless tobacco: Never Used     Comment: trying to cut down  . Alcohol Use: No  . Drug Use: No     Comment: remote marijuana use  . Sexual Activity: Not Currently   Other Topics Concern  . None   Social History Narrative   Lives alone.  He has no children.   He retired early due to health problems.           Family History:  The patient's family history includes Cancer in his mother; Depression in his mother; Hypertension in his mother; Other in his father. There is no history of Heart attack or Stroke.   ROS:   Please see the history of present illness.    ROS All other systems reviewed and are negative.   Physical Exam:    VS:  BP 116/60 mmHg  Pulse 60  Ht 6\' 1"  (1.854 m)  Wt 300 lb 9.6 oz (136.351 kg)  BMI  39.67 kg/m2    Wt Readings from Last 3 Encounters:  12/27/15 300 lb 9.6 oz (136.351 kg)  12/16/15 307 lb 4.8 oz (139.39 kg)  09/12/15 322 lb (146.058 kg)    Physical Exam  Constitutional: He is oriented to person, place, and time. He appears well-developed and well-nourished. No distress.  HENT:  Head: Normocephalic and atraumatic.  Neck: No JVD present.  Cardiovascular: Normal rate, regular rhythm and normal heart sounds.   No murmur heard. Pulmonary/Chest: Effort normal and breath sounds normal. He has no wheezes. He has no rales.  Abdominal: Soft. There is no tenderness.  Musculoskeletal:  Trace - 1+ bilateral ankle edema  Neurological: He is alert and oriented to person, place, and time.  Skin: Skin is warm and dry.  Psychiatric: He has a normal mood and affect.    Studies/Labs Reviewed:    EKG:  EKG is  ordered today.  The ekg ordered today demonstrates NSR, HR 60, normal axis, RBBB, QTc 460 ms, PVC, no significant change when compared to prior tracings  Recent Labs: 02/11/2015: Magnesium 2.1 06/27/2015: TSH 1.31 12/15/2015: B Natriuretic Peptide 34.2 12/16/2015: ALT 17; BUN 27*; Creatinine, Ser 2.61*; Hemoglobin 12.7*; Platelets 196; Potassium 3.3*; Sodium 142   Recent Lipid Panel    Component Value Date/Time   CHOL 113 06/27/2015 1005   TRIG 113.0 06/27/2015 1005   HDL 36.20* 06/27/2015 1005   CHOLHDL 3 06/27/2015 1005   VLDL 22.6 06/27/2015 1005   LDLCALC 54 06/27/2015 1005    Additional studies/ records that were reviewed today include:   Echo 07/07/15 EF 45-50%, inferior HK, mild BAE, PASP 33 mmHg  Myoview 06/16/15 Intermediate risk stress nuclear study with a large, severe, predominantly fixed inferior lateral defect consistent with prior infarct; no significant ischemia; EF 35 but visually appears better; akinesis of the basal and mid inferior lateral wall; mild LVE; suggest echo to better assess LV function. Study intermediate risk due to reduced LV  function.  Echo 9/21/6 Inf HK, mild LVH,  EF 55%, mild LAE, normal RVF, mild RAE, PASP 35 mmHg  LHC (10/15):  Proximal LAD ectatic, mid LAD stent patent, distal LAD 70% at the apex, proximal and mid circumflex diffuse ectasia, small OM1 with CTO, OM2 stent patent, distal OM2 90%, OM3 30-40%, very distal circumflex 90%, proximal to mid RCA stent patent, mid RCA stent 60-70% ISR, EF 50%, inferobasal akinesis  Echo (6/15):  Mild LVH. EF 50% to 55%. Wall motion was normal. Grade 1 diastolic dysfunction. Left atrium: The atrium was mildly dilated. Right ventricle: The cavity size was moderately dilated. Right atrium: The atrium was mildly to moderately dilated.  Myoview 9/12:  mod scar in base/mid inf and IL segments, slight peri-infarct ischemia - low risk (med Rx continued).  Carotid US (4/13):  Bilateral: no ICA stenosis  ASSESSMENT:    1. Coronary artery disease involving native coronary artery of native heart without angina pectoris   2. Chronic combined systolic and diastolic CHF    3. Benign essential HTN   4. Hyperlipidemia   5. Tobacco abuse   6. Acute renal failure, unspecified acute renal failure type ([place])   7. Other chest pain    PLAN:    In order of problems listed above:  1.Coronary artery disease: S/p multiple PCI procedures in the setting of ACS/STEMI. Last LHC in 10/15 with stable anatomy. He was evaluated in 9/16 with CP and neg enzymes. He was admitted in 12/16 with chest pain and elevated troponin levels in the setting of hypertensive urgency. Elevated troponin levels were thought to be related to demand ischemia. His follow-up stress test demonstrated scar but no ischemia. It was called intermediate risk secondary to low EF. Follow-up echo with EF 45-50%. He was admitted again in 7/17 with atypical chest pain. Myocardial infarction was ruled out and medical therapy was continued.  He remains stable without recurrent angina. Continue beta blocker, nitrates,  aspirin, Plavix, rosuvastatin.  2.Chronic combined systolic and diastolic congestive heart failure:  Volume appears stable. He was recently taken off of ARB, diuretics, spironolactone secondary to acute renal failure. The patient is now back on his losartan and Lasix. His breathing and weight have improved since resuming his Lasix last week. Obtain follow-up BMET today. Continue ARB, beta blocker, nitrates.    3.HTN  BP controlled on current regimen.  4.Hyperlipidemia: Continue statin. LDL in 1/17 was 54.  5.Tobacco abuse: He is trying to quit.  He has started to use e-cigarettes.   6. ARF - He was noted to have Cr of 2.83 during recent admit. He did not hold Losartan as previously instructed. He is now back on Lasix.  He is no longer on Spironolactone or Metformin.  -  Repeat BMET today.  7. Chest pain - Atypical for ischemia.  Symptoms sound GI in nature.  DC Pepcid. Start Protonix 40 mg QD.  FU with PCP.    Medication Adjustments/Labs and Tests Ordered: Current medicines are reviewed at length with the patient today.  Concerns regarding medicines are outlined above.  Medication changes, Labs and Tests ordered today are outlined in the Patient Instructions noted below. Patient Instructions  Medication Instructions:  1. STOP GABAPENTIN 100 MG  2. STOP PEPCID 3. CONTINUE ON GABAPENTIN 300 MG AT BEDTIME; MAKE SURE TO FOLLOW UP WITH PRIMARY CARE ABOUT GABAPENTIN 4. START PROTONIX 40 MG DAILY Labwork: 1. TODAY BMET Testing/Procedures: NONE Follow-Up: [name], [facility] 04/05/16 @ 8:15 Any Other Special Instructions Will Be Listed Below (If Applicable). If you need a refill  on your cardiac medications before your next appointment, please call your pharmacy.   Signed, [name], PA-C  12/27/2015 4:08 PM    [facility] [place], [place], [place]  [address] Phone: [phone]; Fax: [phone]

## 2015-12-27 ENCOUNTER — Encounter: Payer: Self-pay | Admitting: Physician Assistant

## 2015-12-27 ENCOUNTER — Ambulatory Visit: Payer: Commercial Managed Care - HMO | Admitting: Physician Assistant

## 2015-12-27 ENCOUNTER — Ambulatory Visit (INDEPENDENT_AMBULATORY_CARE_PROVIDER_SITE_OTHER): Payer: Commercial Managed Care - HMO | Admitting: Physician Assistant

## 2015-12-27 VITALS — BP 116/60 | HR 60 | Ht 73.0 in | Wt 300.6 lb

## 2015-12-27 DIAGNOSIS — N179 Acute kidney failure, unspecified: Secondary | ICD-10-CM

## 2015-12-27 DIAGNOSIS — I10 Essential (primary) hypertension: Secondary | ICD-10-CM

## 2015-12-27 DIAGNOSIS — I5042 Chronic combined systolic (congestive) and diastolic (congestive) heart failure: Secondary | ICD-10-CM

## 2015-12-27 DIAGNOSIS — R0789 Other chest pain: Secondary | ICD-10-CM

## 2015-12-27 DIAGNOSIS — I2510 Atherosclerotic heart disease of native coronary artery without angina pectoris: Secondary | ICD-10-CM

## 2015-12-27 DIAGNOSIS — E785 Hyperlipidemia, unspecified: Secondary | ICD-10-CM | POA: Diagnosis not present

## 2015-12-27 DIAGNOSIS — Z720 Tobacco use: Secondary | ICD-10-CM

## 2015-12-27 MED ORDER — PANTOPRAZOLE SODIUM 40 MG PO TBEC: 40.0000 mg | DELAYED_RELEASE_TABLET | Freq: Every day | ORAL | Status: DC

## 2015-12-27 NOTE — Patient Instructions (Addendum)
Medication Instructions:  1. STOP GABAPENTIN 100 MG  2. STOP PEPCID 3. CONTINUE ON GABAPENTIN 300 MG AT BEDTIME; MAKE SURE TO FOLLOW UP WITH PRIMARY CARE ABOUT GABAPENTIN 4. START PROTONIX 40 MG DAILY Labwork: 1. TODAY BMET Testing/Procedures: NONE Follow-Up: [name], [facility] 04/05/16 @ 8:15 Any Other Special Instructions Will Be Listed Below (If Applicable). If you need a refill on your cardiac medications before your next appointment, please call your pharmacy.

## 2015-12-28 ENCOUNTER — Other Ambulatory Visit: Payer: Self-pay | Admitting: *Deleted

## 2015-12-28 DIAGNOSIS — I5042 Chronic combined systolic (congestive) and diastolic (congestive) heart failure: Secondary | ICD-10-CM

## 2015-12-28 LAB — BASIC METABOLIC PANEL
BUN: 16 mg/dL (ref 7–25)
CALCIUM: 9.6 mg/dL (ref 8.6–10.3)
CO2: 27 mmol/L (ref 20–31)
Chloride: 106 mmol/L (ref 98–110)
Creat: 1.75 mg/dL — ABNORMAL HIGH (ref 0.70–1.33)
GLUCOSE: 87 mg/dL (ref 65–99)
Potassium: 4.1 mmol/L (ref 3.5–5.3)
SODIUM: 142 mmol/L (ref 135–146)

## 2015-12-28 MED ORDER — LOSARTAN POTASSIUM 50 MG PO TABS: 50.0000 mg | ORAL_TABLET | Freq: Every day | ORAL | Status: DC

## 2015-12-28 MED ORDER — POTASSIUM CHLORIDE ER 10 MEQ PO TBCR: EXTENDED_RELEASE_TABLET | ORAL | Status: DC

## 2015-12-28 MED ORDER — FUROSEMIDE 40 MG PO TABS: ORAL_TABLET | ORAL | Status: DC

## 2015-12-29 ENCOUNTER — Ambulatory Visit (INDEPENDENT_AMBULATORY_CARE_PROVIDER_SITE_OTHER): Payer: Commercial Managed Care - HMO | Admitting: Internal Medicine

## 2015-12-29 ENCOUNTER — Ambulatory Visit: Payer: Self-pay

## 2015-12-29 ENCOUNTER — Other Ambulatory Visit: Payer: Self-pay | Admitting: Internal Medicine

## 2015-12-29 ENCOUNTER — Encounter: Payer: Self-pay | Admitting: Internal Medicine

## 2015-12-29 VITALS — BP 130/78 | HR 56 | Temp 98.4°F | Resp 20 | Wt 304.0 lb

## 2015-12-29 DIAGNOSIS — N183 Chronic kidney disease, stage 3 unspecified: Secondary | ICD-10-CM | POA: Insufficient documentation

## 2015-12-29 DIAGNOSIS — N1832 Chronic kidney disease, stage 3b: Secondary | ICD-10-CM | POA: Insufficient documentation

## 2015-12-29 DIAGNOSIS — E785 Hyperlipidemia, unspecified: Secondary | ICD-10-CM | POA: Diagnosis not present

## 2015-12-29 DIAGNOSIS — I10 Essential (primary) hypertension: Secondary | ICD-10-CM

## 2015-12-29 DIAGNOSIS — R6889 Other general symptoms and signs: Secondary | ICD-10-CM

## 2015-12-29 DIAGNOSIS — K5900 Constipation, unspecified: Secondary | ICD-10-CM | POA: Insufficient documentation

## 2015-12-29 DIAGNOSIS — N189 Chronic kidney disease, unspecified: Secondary | ICD-10-CM | POA: Insufficient documentation

## 2015-12-29 DIAGNOSIS — E1159 Type 2 diabetes mellitus with other circulatory complications: Secondary | ICD-10-CM

## 2015-12-29 DIAGNOSIS — Z0001 Encounter for general adult medical examination with abnormal findings: Secondary | ICD-10-CM

## 2015-12-29 MED ORDER — LACTULOSE 20 GM/30ML PO SOLN: ORAL | Status: DC

## 2015-12-29 NOTE — Progress Notes (Signed)
Subjective:    Patient ID: [name] [name], male    DOB: 1955/09/12, 60 y.o.   MRN: [id number]  HPI Here to f/u; overall doing ok,  Pt denies chest pain, increasing sob or doe, wheezing, orthopnea, PND, increased LE swelling, palpitations, dizziness or syncope.  Pt denies new neurological symptoms such as new headache, or facial or extremity weakness or numbness.  Pt denies polydipsia, polyuria, or low sugar episode.   Pt denies new neurological symptoms such as new headache, or facial or extremity weakness or numbness.   Pt states overall good compliance with meds, mostly trying to follow appropriate diet, with wt overall stable,  but little exercise however. Wt Readings from Last 3 Encounters:  12/29/15 304 lb (137.893 kg)  12/27/15 300 lb 9.6 oz (136.351 kg)  12/16/15 307 lb 4.8 oz (139.39 kg)   Metformin stopped due to renal txn, now on current meds  C/o constipation with the tramadol no BM for 1 wk.  Has tried miralax and stool softner in the past.   Had bmp per card 7/18 with improved renal fxn, so ok to decreased the lasix and K with f/u bmp requested for 1 wk. Past Medical History:  Diagnosis Date  . CAD (coronary artery disease)    a. s/p PCI/BMS pRCA 2002; b. PCI pRCA & DES p/m RCA 2006; c. PCI/DES OM4 2007; d. PCI/CBA to RCA for ISR 10/2006; e.08/2012 STEMI/Cath/PCI:  LAD 95% >> PCI: 3.0x20 Promus Prem DES  //  f. NSTEMI 10/15 >> LHC: mLAD stent ok, dLAD 70, OM1 CTO, OM2 stent ok, dOM2 90, OM3 30-40, dLCx 90, p-mRCA stent ok, mRCA stent ok w/ 60-70 ISR, EF 50% >> med Rx  . Chronic combined systolic and diastolic CHF (congestive heart failure) ([place])    a. Myoview 1/17: EF 35%  //  b. Echo 1/17 EF 45-50%  . Depression   . Diabetes mellitus ([place])    a. A1c 8.8 08/2012->Metformin initiated. => b. A1c (9/14): 6.6  . GERD (gastroesophageal reflux disease)   . History of nuclear stress test    a. Myoview 1/17: EF 35%, fixed inferior lateral defect consistent with infarct, no ischemia,  intermediate risk  . History of pneumonia   . HTN (hypertension)   . Hyperlipidemia   . Ischemic cardiomyopathy    a. EF 40%; improved to normal;  b. 08/2012 Echo: EF 50-55%, mod LVH.//  c. Echo 9/16: Inf HK, mild LVH, EF 55%, mild LAE, normal RVF, mild RAE, PASP 35 mmHg  //  d. Echo 1/17: EF 45-50%, inferior HK, mild BAE, PASP 33 mmHg  . NSTEMI (non-ST elevated myocardial infarction) ([place])   . Obesity   . OSA (obstructive sleep apnea)    Does not use CPAP as of 05/2011  . Tobacco abuse    Past Surgical History:  Procedure Laterality Date  . CORONARY ANGIOPLASTY WITH STENT PLACEMENT    . CORONARY STENT PLACEMENT  2009  . LEFT HEART CATHETERIZATION WITH CORONARY ANGIOGRAM N/A 08/31/2012   Procedure: LEFT HEART CATHETERIZATION WITH CORONARY ANGIOGRAM;  Surgeon: Burnell Blanks, MD;  Location: [facility] CATH LAB;  Service: Cardiovascular;  Laterality: N/A;  . LEFT HEART CATHETERIZATION WITH CORONARY ANGIOGRAM N/A 11/16/2013   Procedure: LEFT HEART CATHETERIZATION WITH CORONARY ANGIOGRAM;  Surgeon: Blane Ohara, MD;  Location: [facility] CATH LAB;  Service: Cardiovascular;  Laterality: N/A;  . LEFT HEART CATHETERIZATION WITH CORONARY ANGIOGRAM N/A 03/15/2014   Procedure: LEFT HEART CATHETERIZATION WITH CORONARY ANGIOGRAM;  Surgeon: Peter M Martinique,  MD;  Location: [facility] CATH LAB;  Service: Cardiovascular;  Laterality: N/A;  . PERCUTANEOUS CORONARY STENT INTERVENTION (PCI-S) Right 08/31/2012   Procedure: PERCUTANEOUS CORONARY STENT INTERVENTION (PCI-S);  Surgeon: Burnell Blanks, MD;  Location: [facility] CATH LAB;  Service: Cardiovascular;  Laterality: Right;  . PERCUTANEOUS CORONARY STENT INTERVENTION (PCI-S)  11/16/2013   Procedure: PERCUTANEOUS CORONARY STENT INTERVENTION (PCI-S);  Surgeon: Blane Ohara, MD;  Location: [facility] CATH LAB;  Service: Cardiovascular;;    reports that he has been smoking Cigarettes.  He has a 15.00 pack-year smoking history. He has never used smokeless tobacco. He reports that he  does not drink alcohol or use drugs. family history includes Cancer in his mother; Depression in his mother; Hypertension in his mother; Other in his father. Allergies  Allergen Reactions  . Penicillins Other (See Comments)    Unknown childhood reaction   Current Outpatient Prescriptions on File Prior to Visit  Medication Sig Dispense Refill  . amLODipine (NORVASC) 10 MG tablet Take 10 mg by mouth daily.     [name] aspirin 81 MG tablet Take 81 mg by mouth daily.    . carvedilol (COREG) 6.25 MG tablet Take 1 tablet (6.25 mg total) by mouth 2 (two) times daily with a meal. 60 tablet 6  . clopidogrel (PLAVIX) 75 MG tablet Take 1 tablet (75 mg total) by mouth daily. 30 tablet 6  . escitalopram (LEXAPRO) 10 MG tablet TAKE 1 TABLET BY MOUTH EVERY DAY 90 tablet 1  . furosemide (LASIX) 40 MG tablet Take 2 tablets by mouth every AM and 1 tablet every PM. May take extra tablet for weight gain greater than 3 lbs in one day 100 tablet 3  . gabapentin (NEURONTIN) 300 MG capsule TAKE 1 CAPSULE(300 MG) BY MOUTH AT BEDTIME 30 capsule 3  . glimepiride (AMARYL) 1 MG tablet Take 1 tablet (1 mg total) by mouth daily with breakfast. 30 tablet 1  . isosorbide mononitrate (IMDUR) 30 MG 24 hr tablet TAKE 3 TABLETS BY MOUTH DAILY 270 tablet 3  . losartan (COZAAR) 50 MG tablet Take 1 tablet (50 mg total) by mouth daily. 30 tablet 6  . Multiple Vitamins-Minerals (MULTIVITAMIN WITH MINERALS) tablet Take 1 tablet by mouth daily. CENTRUM SILVER MENS  50 PLUS    . nitroGLYCERIN (NITROSTAT) 0.4 MG SL tablet PLACE 1 TABLET UNDER THE TONGUE EVERY 5 MINUTES AS NEEDED FOR CHEST PAIN 25 tablet 5  . pantoprazole (PROTONIX) 40 MG tablet Take 1 tablet (40 mg total) by mouth daily. 30 tablet 6  . potassium chloride (K-DUR) 10 MEQ tablet Take 2 tablets by mouth every AM and 1 tablet every PM 90 tablet 3  . rosuvastatin (CRESTOR) 40 MG tablet Take 1 tablet (40 mg total) by mouth daily. 90 tablet 3  . traMADol (ULTRAM) 50 MG tablet Take by  mouth every 4 (four) hours as needed for moderate pain or severe pain.     No current facility-administered medications on file prior to visit.     Review of Systems  Constitutional: Negative for unusual diaphoresis or night sweats HENT: Negative for ear swelling or discharge Eyes: Negative for worsening visual haziness  Respiratory: Negative for choking and stridor.   Gastrointestinal: Negative for distension or worsening eructation Genitourinary: Negative for retention or change in urine volume.  Musculoskeletal: Negative for other MSK pain or swelling Skin: Negative for color change and worsening wound Neurological: Negative for tremors and numbness other than noted  Psychiatric/Behavioral: Negative for decreased concentration or agitation other  than above       Objective:   Physical Exam BP 130/78   Pulse (!) 56   Temp 98.4 F (36.9 C) (Oral)   Resp 20   Wt (!) 304 lb (137.9 kg)   SpO2 94%   BMI 40.11 kg/m   VS noted,  Constitutional: Pt appears in no apparent distress HENT: Head: NCAT.  Right Ear: External ear normal.  Left Ear: External ear normal.  Eyes: . Pupils are equal, round, and reactive to light. Conjunctivae and EOM are normal Neck: Normal range of motion. Neck supple.  Cardiovascular: Normal rate and regular rhythm.   Pulmonary/Chest: Effort normal and breath sounds without rales or wheezing.  Abd:  Soft, NT, ND, + BS Neurological: Pt is alert. Not confused , motor grossly intact Skin: Skin is warm. No rash, no LE edema Psychiatric: Pt behavior is normal. No agitation.     Assessment & Plan:

## 2015-12-29 NOTE — Progress Notes (Signed)
Pre visit review using our clinic review tool, if applicable. No additional management support is needed unless otherwise documented below in the visit note. 

## 2015-12-29 NOTE — Patient Instructions (Addendum)
Please take all new medication as prescribed - the laxative  Please start miralax every day - 17 mg  Please also take Colace 100 mg twice per day (unless you get too loose stools and have to cut back)  Please continue all other medications as before, and refills have been done if requested.  Please have the pharmacy call with any other refills you may need.  Please continue your efforts at being more active, low cholesterol diet, and weight control.  Please keep your appointments with your specialists as you may have planned  Please return to the [facility] lab on July 26 (or very soon after) for blood tests   You will be contacted by phone if any changes need to be made immediately.  Otherwise, you will receive a letter about your results with an explanation, but please check with MyChart first.  Please remember to sign up for MyChart if you have not done so, as this will be important to you in the future with finding out test results, communicating by private email, and scheduling acute appointments online when needed.  Please return in 6 months, or sooner if needed, with Lab testing done 3-5 days before

## 2015-12-30 ENCOUNTER — Other Ambulatory Visit: Payer: Self-pay

## 2015-12-30 NOTE — Patient Outreach (Signed)
[facility]) Care Management  12/30/2015  [name] [name]  Feb 04, 1956 [id number]  Attempted outreach to patient at [phone] without success. Left HIPAA compliant voicemail and provided RNCM contact information. Invited callback.  [place] R. Tuyet Bader, RN, BSN, [place] Management Coordinator [phone]

## 2016-01-01 ENCOUNTER — Other Ambulatory Visit: Payer: Self-pay | Admitting: Physician Assistant

## 2016-01-01 NOTE — Assessment & Plan Note (Signed)
stable overall by history and exam, recent data reviewed with pt, and pt to continue medical treatment as before,  to f/u any worsening symptoms or concerns Lab Results  Component Value Date   CREATININE 1.75 (H) 12/27/2015

## 2016-01-01 NOTE — Assessment & Plan Note (Signed)
[place] for lactulose prn but start with miralax/colace,  to f/u any worsening symptoms or concerns

## 2016-01-01 NOTE — Assessment & Plan Note (Signed)
stable overall by history and exam, recent data reviewed with pt, and pt to continue medical treatment as before,  to f/u any worsening symptoms or concerns BP Readings from Last 3 Encounters:  12/29/15 130/78  12/27/15 116/60  12/16/15 119/63

## 2016-01-01 NOTE — Assessment & Plan Note (Signed)
stable overall by history and exam, recent data reviewed with pt, and pt to continue medical treatment as before,  to f/u any worsening symptoms or concerns Lab Results  Component Value Date   HGBA1C 6.0 (H) 12/15/2015

## 2016-01-01 NOTE — Assessment & Plan Note (Signed)
stable overall by history and exam, recent data reviewed with pt, and pt to continue medical treatment as before,  to f/u any worsening symptoms or concerns Lab Results  Component Value Date   LDLCALC 54 06/27/2015

## 2016-01-02 ENCOUNTER — Other Ambulatory Visit: Payer: Self-pay

## 2016-01-03 NOTE — Patient Outreach (Signed)
[place] [facility]) Care Management [place] Telephone Outreach for Transition of Care   01/02/2016  [name] [name] 1956-04-03 [id number]  Successful outreach to patient at home number. Provided education about confidentiality and verified patient identity.  Patient reports he is doing ok. Denies any pain or any significant changes at present. Advised calling to schedule home visit and patient agreeable.  Home visit scheduled for this week. RNCM contact information provided.  Plan: RNCM will complete home visit this week, follow up for transition of care and assess for care coordination needs.  [place] R. Jacia Sickman, RN, BSN, [place] Management Coordinator [phone]

## 2016-01-04 ENCOUNTER — Other Ambulatory Visit (INDEPENDENT_AMBULATORY_CARE_PROVIDER_SITE_OTHER): Payer: Commercial Managed Care - HMO

## 2016-01-04 ENCOUNTER — Other Ambulatory Visit: Payer: Commercial Managed Care - HMO

## 2016-01-04 DIAGNOSIS — E1159 Type 2 diabetes mellitus with other circulatory complications: Secondary | ICD-10-CM | POA: Diagnosis not present

## 2016-01-04 LAB — HEPATIC FUNCTION PANEL
ALT: 14 U/L (ref 0–53)
AST: 14 U/L (ref 0–37)
Albumin: 4 g/dL (ref 3.5–5.2)
Alkaline Phosphatase: 56 U/L (ref 39–117)
BILIRUBIN DIRECT: 0.2 mg/dL (ref 0.0–0.3)
BILIRUBIN TOTAL: 0.4 mg/dL (ref 0.2–1.2)
Total Protein: 7 g/dL (ref 6.0–8.3)

## 2016-01-04 LAB — BASIC METABOLIC PANEL
BUN: 16 mg/dL (ref 6–23)
CALCIUM: 9.6 mg/dL (ref 8.4–10.5)
CHLORIDE: 104 meq/L (ref 96–112)
CO2: 32 mEq/L (ref 19–32)
CREATININE: 1.67 mg/dL — AB (ref 0.40–1.50)
GFR: 54.45 mL/min — AB (ref >60.00)
Glucose, Bld: 115 mg/dL — ABNORMAL HIGH (ref 70–99)
Potassium: 3.7 mEq/L (ref 3.5–5.1)
Sodium: 141 mEq/L (ref 135–145)

## 2016-01-04 LAB — LIPID PANEL
CHOL/HDL RATIO: 2
Cholesterol: 89 mg/dL (ref 0–200)
HDL: 38.3 mg/dL — AB (ref >39.00)
LDL CALC: 43 mg/dL (ref 0–99)
NONHDL: 50.6
Triglycerides: 40 mg/dL (ref 0.0–149.0)
VLDL: 8 mg/dL (ref 0.0–40.0)

## 2016-01-04 LAB — HEMOGLOBIN A1C: Hgb A1c MFr Bld: 6.4 % (ref 4.6–6.5)

## 2016-01-05 ENCOUNTER — Other Ambulatory Visit: Payer: Self-pay

## 2016-01-06 ENCOUNTER — Other Ambulatory Visit: Payer: Self-pay

## 2016-01-06 NOTE — Patient Outreach (Signed)
[place] [facility]) Care Management  01/05/2016  [name] [name] 12-04-1955 [id number]   Home visit completed with patient.  Subjective: Patient reports he is feeling better. Currently denies any pain, but does state that he has back pain when he is up moving around. Patient denies any shortness of breath, but does report some swelling in both legs up to his knees. Patient has all of his medications out for nurse to review, including his blood pressure cuff and glucometer. Patient reports that he is trying to quit smoking. Stated that he has not had a cigarette in 2 weeks. He reports he is using an e-cigarette to bridge him to quit smoking. Uses it approximately 3 times per day and it does have nicotine. Patient is hopeful he will be successful.  Patient is weighing daily first thing when he gets up and writing it down. Reported weight today was 279.8, weight on 7/26 was 293.0, and weight on 7/25 was 294.6.  Patient reports that he was started on Lactulose due to constipation. Patient stated that he had been constipated x2 weeks, but had a very large BM yesterday.  Objective: Per records review, patient had a recent hospitalization 7/7-12/16/2015. Patient presented with chest pain. Work up was negative for cardiac origin but did have acute kidney injury. His Lasix, spironolactone, amlodipine, clonidine, losartan, metformin, and potassium were discontinued.  Patient weight on 7/18 was 300 at cardiology follow up; 304 on 7/20 at PCP follow up Assessment: Patient is alert and oriented, sitting on front porch for [facility] visit. Patient currently denies any pain, shortness of breath or weight gain. Patient is weighing daily, but is not checking his blood sugars. Per review of his glucometer, he last checked his blood sugar on 12/19/2014. Last reading was 113 fasting. Educated patient on importance of checking blood sugar with diagnosis of diabetes. He stated he was not sure how often he was supposed to  check it. Stated that he thought it was once a day, but someone told him he should check it twice a day. RNCM encouraged to check at least once a day, but encouraged to follow doctor's instructions regarding how often he should check it.  Patient is weighing himself daily. Today's weight is 279.8, with weight yesterday 293, showing a weight loss of 13.2. Had patient reweigh, but there was no even surface outside (patient had an issue with fridge breaking down and stated his home has a bad odor and wanted to complete visit outside). Reweigh was 283.6 (9.4 lbs loss). Advised may not be an accurate weight today. Advised RNCM will call patient tomorrow to re-evaluate tomorrow's weight and if there is actual weight loss of  around 10 lbs, RNCM will contact his doctor.  Patient is agreeable.  Provided the following EMMI education today to patient: What is heart failure, heart failure: how to be salt smart, and heart failure - home monitoring. Patient verbalized understanding of education and denies any questions. Stated that he struggles with low salt due to salt being in so many foods. Educated patient on low salt foods and encouraged to eat as much fresh foods as possible. Patient stated that he had bought some frozen meatballs recently and looked at the salt in it. He stated that he thought it was okay to eat, but reports that he had significant swelling in his hands and feet after eating them. Educated patient to avoid eating processed foods. Patient verbalizes understanding.  Plan: Continue to provide heart failure education and low salt education.  Continue to follow for transition of care.  [place] R. Takara Sermons, RN, BSN, [place] Management Coordinator [phone]

## 2016-01-06 NOTE — Patient Outreach (Signed)
[place] [facility]) Care Management  01/06/16  Fitzgerald Hatten 11-10-1955 [id number]  Successful outreach to patient today to follow up regarding today's weight. Patient identity verified.  Patient stated he is feeling ok today. Denies any pain or shortness of breath. Denies any increase in swelling. Stated that he did weigh today, weight was 292. Patient also stated that he checked his fasting blood sugar this morning, FSBS 83. Patient stated that he is at his sister's today and he does not have a blood pressure cuff, so he has not had the opportunity to check. Patient has no other concerns at present. Encouraged to continue to monitor weight, blood sugar, and blood pressure. Encouraged to call doctor with any weight gain of 3 lbs in one day, 5 lbs in one week or any significant weight loss.   Plan: Will follow up via phone call next week.   [place] R. Sharma Lawrance, RN, BSN, [place] Management Coordinator [phone]

## 2016-01-11 ENCOUNTER — Encounter: Payer: Self-pay | Admitting: Physician Assistant

## 2016-01-13 ENCOUNTER — Other Ambulatory Visit: Payer: Self-pay

## 2016-01-13 NOTE — Patient Outreach (Signed)
[place] [facility]) Care Management  01/13/16  Shriyansh Podbielski 09/30/1955 [id number]  Successful outreach completed with patient. However, patient stated that he is currently not at home and requested that [facility] call back next week.  RNCM will outreach to patient next week to follow up.  [place] R. Ivor Kishi, RN, BSN, [place] Management Coordinator [phone]

## 2016-01-16 ENCOUNTER — Ambulatory Visit: Payer: Self-pay

## 2016-01-20 ENCOUNTER — Other Ambulatory Visit: Payer: Self-pay | Admitting: Internal Medicine

## 2016-01-20 ENCOUNTER — Other Ambulatory Visit: Payer: Self-pay

## 2016-01-20 NOTE — Telephone Encounter (Signed)
Refill sent to pharmacy.   

## 2016-01-20 NOTE — Patient Outreach (Signed)
[place] [facility]) Care Management 01/20/16  Friend Vanderpoel Apr 14, 1956 [id number]  Attempted outreach to patient without success. Left HIPAA compliant voicemail with RNCM contact information and invited callback.  [place] R. Jaydalyn Demattia, RN, BSN, [place] Management Coordinator [phone]

## 2016-01-20 NOTE — Telephone Encounter (Signed)
Done hardcopy to Corinne  

## 2016-01-23 ENCOUNTER — Ambulatory Visit: Payer: Commercial Managed Care - HMO

## 2016-01-27 ENCOUNTER — Other Ambulatory Visit: Payer: Self-pay

## 2016-01-27 NOTE — Patient Outreach (Signed)
[place] [facility]) Care Management  01/27/16  [name] [name] 1955-11-07 [id number]  Attempted outreach to patient. Patient initially answered phone and lost connection. RNCM attempted to reach patient again without success. Left HIPAA compliant voicemail with RNCM contact info and invited callback.  [place] R. Yafet Cline, RN, BSN, [place] Management Coordinator [phone]

## 2016-02-03 ENCOUNTER — Other Ambulatory Visit: Payer: Self-pay

## 2016-02-03 NOTE — Patient Outreach (Signed)
[facility]) Care Management  02/03/16  [name] [name] 1955-08-24 [id number]  Second attempt to outreach patient without success. Left HIPAA compliant voicemail with RNCM contact information and invited callback.  [place] R. Oswin Johal, RN, BSN, [place] Management Coordinator [phone].

## 2016-02-07 ENCOUNTER — Other Ambulatory Visit: Payer: Self-pay

## 2016-02-08 NOTE — Patient Outreach (Signed)
[place] [facility]) Care Management  02/08/16  [name] [name] 1955-09-21 [id number]   Successful outreach completed with patient. Patient identification verified. Patient reported that he has very busy recently and has not been checking his blood pressure or blood sugar. Stated that he really just has not been able to find the time. Patient reported that he is weighing himself and his weight has been good. Stated that he did not weigh today, but reports weight yesterday morning was 286.4. Patient reports that he has a friend who will be temporarily moving in with him, so he has been working in his home trying to make room for him.  Patient currently denies any pain or shortness of breath. Has no complaints or concerns.  Patient reported that he has not been eating good foods lately. Stated that he has had more junk food than normal due to being a little low on food. Patient stated that his money that he receives monthly has been cut back and that made it a little more challenging but he is working on getting it reinstated. Patient reports he is getting together his bank statements and paperwork and will get that in. RNCM advised can make a social work referral for assistance with community resources for food and patient declines at present. He reports that he will be getting paid at the end of this week and that his friend will be helping him until he gets his benefits reinstated. RNCM encouraged to contact RNCM if he changes his mind or finds that he needs additional assistance with resources and patient verbalized understanding and his appreciation for the offer.  Patient stated that he got a letter in the mail from his heart doctor rescheduling his appointment. Stated that it has been rescheduled a couple of times and he does not know why. RNCM advised he has an appointment scheduled with Dr. [name] on 10/26, but he thinks he is supposed to call to reschedule again. RNCM offered assistance  with reaching out to Dr. [name] to find out why his appointment keeps getting rescheduled and patient was agreeable.  Patient agreeable for home visit in next few weeks. RNCM will outreach to Dr. Kathleen Argue office to find out why his appointments keep getting changed as well.  [name], RN, BSN, [place] Management Coordinator [phone]

## 2016-02-12 ENCOUNTER — Other Ambulatory Visit: Payer: Self-pay | Admitting: Internal Medicine

## 2016-02-20 ENCOUNTER — Other Ambulatory Visit: Payer: Self-pay

## 2016-02-21 ENCOUNTER — Ambulatory Visit: Payer: Self-pay

## 2016-02-21 NOTE — Patient Outreach (Signed)
[place]) Care Management  02/20/2016  Deonne Corniel 12/11/1955 [id number]  Attempted to outreach patient without success. Left voicemail advising calling to reschedule appointment for Tuesday, September 12 to Thursday, September 14. RNCM contact information provided and patient encouraged to call back.   [place] R. Sharel Behne, RN, BSN, [place] Management Coordinator [phone]

## 2016-02-23 ENCOUNTER — Ambulatory Visit: Payer: Self-pay

## 2016-02-24 ENCOUNTER — Telehealth: Payer: Self-pay | Admitting: Internal Medicine

## 2016-02-24 DIAGNOSIS — G4733 Obstructive sleep apnea (adult) (pediatric): Secondary | ICD-10-CM | POA: Diagnosis not present

## 2016-02-24 MED ORDER — ESCITALOPRAM OXALATE 20 MG PO TABS: 20.0000 mg | ORAL_TABLET | Freq: Every day | ORAL | 3 refills | Status: DC

## 2016-02-24 NOTE — Telephone Encounter (Signed)
Ok for change lexapro to 20 mg per day  Done erx

## 2016-02-24 NOTE — Telephone Encounter (Signed)
[name] [name] called from [name] [name] stating [name] [name] request refill for escitalopram (LEXAPRO) 10 MG tablet , he has been taking 2 tab daily instead of 1 (he said we change it to 2). Please check and call them back because he is out.

## 2016-02-27 ENCOUNTER — Other Ambulatory Visit: Payer: Self-pay

## 2016-02-28 NOTE — Patient Outreach (Signed)
[place] [facility]) Care Management  02/27/2016  Abdirahim Kichline [birth year] [id number]  Attempted outreach to patient. Patient did answer the phone, but was unavailable for a call and requested a callback at another time. RNCM will attempt again later this week.  [place] R. Linde Wilensky, RN, BSN, [place] Management Coordinator [phone]

## 2016-03-01 ENCOUNTER — Other Ambulatory Visit: Payer: Self-pay | Admitting: Cardiovascular Disease

## 2016-03-02 ENCOUNTER — Other Ambulatory Visit: Payer: Self-pay

## 2016-03-02 NOTE — Patient Outreach (Signed)
[place] [facility]) Care Management  03/02/16  [name] [name] 11-Jan-1956 [id number]  Attempted to outreach patient without success. Left HIPAA compliant voicemail with RNCM contact information and invited callback.  [place] R. Reon Hunley, RN, BSN, [place] Management Coordinator [phone]

## 2016-03-08 ENCOUNTER — Other Ambulatory Visit: Payer: Self-pay | Admitting: Family Medicine

## 2016-03-08 NOTE — Telephone Encounter (Signed)
Refill done.  

## 2016-03-14 ENCOUNTER — Other Ambulatory Visit: Payer: Self-pay | Admitting: Internal Medicine

## 2016-03-16 ENCOUNTER — Other Ambulatory Visit: Payer: Self-pay

## 2016-03-16 NOTE — Patient Outreach (Signed)
[place] [facility]) Care Management  03/16/16  [name] [name] 1955/11/19 [id number]  Third and final outreach attempt completed without success on 03/16/2016. Left HIPAA compliant voicemail with RNCM contact information and requested callback.   Plan- RNCM will send outreach barrier letter and wait 10 business days before completing case closure if no return call has been received.  [place] R. Erasmo Vertz, RN, BSN, [place] Management Coordinator [phone]

## 2016-04-04 ENCOUNTER — Other Ambulatory Visit: Payer: Self-pay | Admitting: Family Medicine

## 2016-04-04 NOTE — Telephone Encounter (Signed)
Pt needs an OV for future refills. Refill denied.

## 2016-04-05 ENCOUNTER — Ambulatory Visit: Payer: Commercial Managed Care - HMO | Admitting: Physician Assistant

## 2016-04-08 ENCOUNTER — Other Ambulatory Visit: Payer: Self-pay | Admitting: Internal Medicine

## 2016-04-13 ENCOUNTER — Other Ambulatory Visit: Payer: Self-pay | Admitting: Internal Medicine

## 2016-04-20 ENCOUNTER — Other Ambulatory Visit: Payer: Self-pay | Admitting: Physician Assistant

## 2016-04-20 DIAGNOSIS — I2510 Atherosclerotic heart disease of native coronary artery without angina pectoris: Secondary | ICD-10-CM

## 2016-04-20 NOTE — Telephone Encounter (Signed)
The source prescription was discontinued on 12/16/2015 by Kelvin Cellar, MD for the following reason: Stop Taking at Discharge.

## 2016-04-30 ENCOUNTER — Other Ambulatory Visit: Payer: Self-pay | Admitting: Physician Assistant

## 2016-04-30 DIAGNOSIS — I2510 Atherosclerotic heart disease of native coronary artery without angina pectoris: Secondary | ICD-10-CM

## 2016-05-04 ENCOUNTER — Encounter: Payer: Self-pay | Admitting: Internal Medicine

## 2016-05-04 ENCOUNTER — Ambulatory Visit (INDEPENDENT_AMBULATORY_CARE_PROVIDER_SITE_OTHER): Payer: Commercial Managed Care - HMO | Admitting: Internal Medicine

## 2016-05-04 VITALS — BP 136/86 | HR 68 | Temp 98.0°F | Ht 72.0 in | Wt 284.4 lb

## 2016-05-04 DIAGNOSIS — I10 Essential (primary) hypertension: Secondary | ICD-10-CM | POA: Diagnosis not present

## 2016-05-04 DIAGNOSIS — E1159 Type 2 diabetes mellitus with other circulatory complications: Secondary | ICD-10-CM

## 2016-05-04 DIAGNOSIS — E785 Hyperlipidemia, unspecified: Secondary | ICD-10-CM | POA: Diagnosis not present

## 2016-05-04 DIAGNOSIS — M545 Low back pain: Secondary | ICD-10-CM

## 2016-05-04 DIAGNOSIS — G8929 Other chronic pain: Secondary | ICD-10-CM

## 2016-05-04 DIAGNOSIS — Z23 Encounter for immunization: Secondary | ICD-10-CM | POA: Diagnosis not present

## 2016-05-04 DIAGNOSIS — Z0001 Encounter for general adult medical examination with abnormal findings: Secondary | ICD-10-CM

## 2016-05-04 MED ORDER — LOSARTAN POTASSIUM 100 MG PO TABS: ORAL_TABLET | ORAL | 3 refills | Status: DC

## 2016-05-04 MED ORDER — TRAMADOL HCL 50 MG PO TABS: 50.0000 mg | ORAL_TABLET | Freq: Four times a day (QID) | ORAL | 2 refills | Status: DC | PRN

## 2016-05-04 MED ORDER — CLONIDINE HCL 0.3 MG PO TABS: 0.3000 mg | ORAL_TABLET | Freq: Two times a day (BID) | ORAL | 3 refills | Status: DC

## 2016-05-04 NOTE — Patient Instructions (Addendum)
You had the flu shot today  Your A1c was 5.8 today (normal)  OK to stop the losartan 50 mg (or you can use up the current bottle by taking 2 per day)  Please continue all other medications as before, and refills have been done if requested, including the losartan 100 mg, and the clonidine  0.3 mg twice per day  Please have the pharmacy call with any other refills you may need.  Please continue your efforts at being more active, low cholesterol diet, and weight control.  You are otherwise up to date with prevention measures today.  Please keep your appointments with your specialists as you may have planned  Please return in 3 months, or sooner if needed, with Lab testing done 3-5 days before

## 2016-05-04 NOTE — Progress Notes (Signed)
Pre visit review using our clinic review tool, if applicable. No additional management support is needed unless otherwise documented below in the visit note. 

## 2016-05-04 NOTE — Progress Notes (Signed)
Subjective:    Patient ID: [name] [name], male    DOB: Dec 10, 1955, 60 y.o.   MRN: [id number]  HPI  Here to f/u; overall doing ok,  Pt denies chest pain, increasing sob or doe, wheezing, orthopnea, PND, increased LE swelling, palpitations, dizziness or syncope.  Pt denies new neurological symptoms such as new headache, or facial or extremity weakness or numbness.  Pt denies polydipsia, polyuria, or low sugar episode.   Pt denies new neurological symptoms such as new headache, or facial or extremity weakness or numbness.   Pt states overall good compliance with meds, mostly trying to follow appropriate diet, with wt overall stable,  but little exercise however. Has lost 18 lbs since last the with better diet, except has had some indulging yesterday with thanksgiving.   Wt Readings from Last 3 Encounters:  02/08/16 286 lb 6.4 oz (129.9 kg)  01/05/16 293 lb (132.9 kg)  12/29/15 (!) 304 lb (137.9 kg)  Stopped smoking x 6 mo.    Pt continues to have recurring LBP without change in severity, bowel or bladder change, fever, wt loss,  worsening LE pain/numbness/weakness, gait change or falls, needs refill tramadol, often takes bid only, still trying to put off surgury.   Confused about losartan 50 vs 100; still taking losartan 50 b/c pharmacy keeps refilling it.  Has restarted taking the clonidine 0.3 mg as BP has been elevated at home, asks for refill  No other changes to history Past Medical History:  Diagnosis Date  . CAD (coronary artery disease)    a. s/p PCI/BMS pRCA 2002; b. PCI pRCA & DES p/m RCA 2006; c. PCI/DES OM4 2007; d. PCI/CBA to RCA for ISR 10/2006; e.08/2012 STEMI/Cath/PCI:  LAD 95% >> PCI: 3.0x20 Promus Prem DES  //  f. NSTEMI 10/15 >> LHC: mLAD stent ok, dLAD 70, OM1 CTO, OM2 stent ok, dOM2 90, OM3 30-40, dLCx 90, p-mRCA stent ok, mRCA stent ok w/ 60-70 ISR, EF 50% >> med Rx  . Chronic combined systolic and diastolic CHF (congestive heart failure) ([place])    a. Myoview 1/17: EF 35%  //   b. Echo 1/17 EF 45-50%  . Depression   . Diabetes mellitus ([place])    a. A1c 8.8 08/2012->Metformin initiated. => b. A1c (9/14): 6.6  . GERD (gastroesophageal reflux disease)   . History of nuclear stress test    a. Myoview 1/17: EF 35%, fixed inferior lateral defect consistent with infarct, no ischemia, intermediate risk  . History of pneumonia   . HTN (hypertension)   . Hyperlipidemia   . Ischemic cardiomyopathy    a. EF 40%; improved to normal;  b. 08/2012 Echo: EF 50-55%, mod LVH.//  c. Echo 9/16: Inf HK, mild LVH, EF 55%, mild LAE, normal RVF, mild RAE, PASP 35 mmHg  //  d. Echo 1/17: EF 45-50%, inferior HK, mild BAE, PASP 33 mmHg  . NSTEMI (non-ST elevated myocardial infarction) ([place])   . Obesity   . OSA (obstructive sleep apnea)    Does not use CPAP as of 05/2011  . Tobacco abuse    Past Surgical History:  Procedure Laterality Date  . CORONARY ANGIOPLASTY WITH STENT PLACEMENT    . CORONARY STENT PLACEMENT  2009  . LEFT HEART CATHETERIZATION WITH CORONARY ANGIOGRAM N/A 08/31/2012   Procedure: LEFT HEART CATHETERIZATION WITH CORONARY ANGIOGRAM;  Surgeon: Burnell Blanks, MD;  Location: [facility] CATH LAB;  Service: Cardiovascular;  Laterality: N/A;  . LEFT HEART CATHETERIZATION WITH CORONARY ANGIOGRAM N/A 11/16/2013  Procedure: LEFT HEART CATHETERIZATION WITH CORONARY ANGIOGRAM;  Surgeon: Blane Ohara, MD;  Location: [facility] C[facility] LAB;  Service: Cardiovascular;  Laterality: N/A;  . LEFT HEART CATHETERIZATION WITH CORONARY ANGIOGRAM N/A 03/15/2014   Procedure: LEFT HEART CATHETERIZATION WITH CORONARY ANGIOGRAM;  Surgeon: Peter M Martinique, MD;  Location: [facility] CATH LAB;  Service: Cardiovascular;  Laterality: N/A;  . PERCUTANEOUS CORONARY STENT INTERVENTION (PCI-S) Right 08/31/2012   Procedure: PERCUTANEOUS CORONARY STENT INTERVENTION (PCI-S);  Surgeon: Burnell Blanks, MD;  Location: [facility] CATH LAB;  Service: Cardiovascular;  Laterality: Right;  . PERCUTANEOUS CORONARY STENT INTERVENTION  (PCI-S)  11/16/2013   Procedure: PERCUTANEOUS CORONARY STENT INTERVENTION (PCI-S);  Surgeon: Blane Ohara, MD;  Location: [facility] CATH LAB;  Service: Cardiovascular;;    reports that he has been smoking Cigarettes.  He has a 15.00 pack-year smoking history. He has never used smokeless tobacco. He reports that he does not drink alcohol or use drugs. family history includes Cancer in his mother; Depression in his mother; Hypertension in his mother; Other in his father. Allergies  Allergen Reactions  . Penicillins Other (See Comments)    Unknown childhood reaction   Current Outpatient Prescriptions on File Prior to Visit  Medication Sig Dispense Refill  . amLODipine (NORVASC) 10 MG tablet Take 10 mg by mouth daily.     [name] aspirin 81 MG tablet Take 81 mg by mouth daily.    . carvedilol (COREG) 6.25 MG tablet TAKE 1 TABLET(6.25 MG) BY MOUTH TWICE DAILY WITH A MEAL 60 tablet 5  . clopidogrel (PLAVIX) 75 MG tablet TAKE 1 TABLET(75 MG) BY MOUTH DAILY 30 tablet 5  . escitalopram (LEXAPRO) 20 MG tablet Take 1 tablet (20 mg total) by mouth daily. 90 tablet 3  . furosemide (LASIX) 40 MG tablet Take 2 tablets by mouth every AM and 1 tablet every PM. May take extra tablet for weight gain greater than 3 lbs in one day 100 tablet 3  . furosemide (LASIX) 40 MG tablet TAKE 1 AND 1/2 TABLETS BY MOUTH TWICE DAILY 270 tablet 2  . gabapentin (NEURONTIN) 100 MG capsule TAKE 1 CAPSULE BY MOUTH TWICE DAILY. NEEDS OFFICE VISIT FOR MORE REFILLS 180 capsule 0  . gabapentin (NEURONTIN) 300 MG capsule TAKE 1 CAPSULE(300 MG) BY MOUTH AT BEDTIME 30 capsule 3  . glimepiride (AMARYL) 1 MG tablet TAKE 1 TABLET BY MOUTH EVERY DAY WITH BREAKFAST 30 tablet 0  . isosorbide mononitrate (IMDUR) 30 MG 24 hr tablet TAKE 3 TABLETS BY MOUTH DAILY 270 tablet 3  . lactulose (CHRONULAC) 10 GM/15ML solution TAKE 30 ML BY MOUTH TWICE DAILY AS NEEDED 6081 mL 1  . Multiple Vitamins-Minerals (MULTIVITAMIN WITH MINERALS) tablet Take 1 tablet by  mouth daily. CENTRUM SILVER MENS  50 PLUS    . nitroGLYCERIN (NITROSTAT) 0.4 MG SL tablet PLACE 1 TABLET UNDER THE TONGUE EVERY 5 MINUTES AS NEEDED FOR CHEST PAIN 25 tablet 5  . pantoprazole (PROTONIX) 40 MG tablet Take 1 tablet (40 mg total) by mouth daily. 30 tablet 6  . potassium chloride (K-DUR) 10 MEQ tablet Take 2 tablets by mouth every AM and 1 tablet every PM 90 tablet 3  . rosuvastatin (CRESTOR) 40 MG tablet Take 1 tablet (40 mg total) by mouth daily. Yearly physical is due in January must see Md for refills 90 tablet 0   No current facility-administered medications on file prior to visit.    Review of Systems  Constitutional: Negative for unusual diaphoresis or night sweats HENT: Negative for  ear swelling or discharge Eyes: Negative for worsening visual haziness  Respiratory: Negative for choking and stridor.   Gastrointestinal: Negative for distension or worsening eructation Genitourinary: Negative for retention or change in urine volume.  Musculoskeletal: Negative for other MSK pain or swelling Skin: Negative for color change and worsening wound Neurological: Negative for tremors and numbness other than noted  Psychiatric/Behavioral: Negative for decreased concentration or agitation other than above   All other system neg per pt    Objective:   Physical Exam BP 136/86   Pulse 68   Temp 98 F (36.7 C) (Oral)   Ht 6' (1.829 m)   Wt 284 lb 6.4 oz (129 kg)   SpO2 96%   BMI 38.57 kg/m  VS noted,  Constitutional: Pt appears in no apparent distress HENT: Head: NCAT.  Right Ear: External ear normal.  Left Ear: External ear normal.  Eyes: . Pupils are equal, round, and reactive to light. Conjunctivae and EOM are normal Neck: Normal range of motion. Neck supple.  Cardiovascular: Normal rate and regular rhythm.   Pulmonary/Chest: Effort normal and breath sounds without rales or wheezing.  Neurological: Pt is alert. Not confused , motor grossly intact Skin: Skin is warm. No  rash, no LE edema Psychiatric: Pt behavior is normal. No agitation.  Spine nontender, without swelling or rash  A1c today -  5.8    Assessment & Plan:

## 2016-05-05 NOTE — Assessment & Plan Note (Signed)
stable overall by history and exam, recent data reviewed with pt, and pt to continue medical treatment as before,  to f/u any worsening symptoms or concerns BP Readings from Last 3 Encounters:  05/04/16 136/86  01/05/16 110/82  12/29/15 130/78

## 2016-05-05 NOTE — Assessment & Plan Note (Signed)
stable overall by history and exam, recent data reviewed with pt, and pt to continue medical treatment as before,  to f/u any worsening symptoms or concerns Lab Results  Component Value Date   HGBA1C 6.4 01/04/2016

## 2016-05-05 NOTE — Assessment & Plan Note (Signed)
stable overall by history and exam, recent data reviewed with pt, and pt to continue medical treatment as before,  to f/u any worsening symptoms or concerns Lab Results  Component Value Date   WBC 7.0 12/16/2015   HGB 12.7 (L) 12/16/2015   HCT 38.9 (L) 12/16/2015   PLT 196 12/16/2015   GLUCOSE 115 (H) 01/04/2016   CHOL 89 01/04/2016   TRIG 40.0 01/04/2016   HDL 38.30 (L) 01/04/2016   LDLCALC 43 01/04/2016   ALT 14 01/04/2016   AST 14 01/04/2016   NA 141 01/04/2016   K 3.7 01/04/2016   CL 104 01/04/2016   CREATININE 1.67 (H) 01/04/2016   BUN 16 01/04/2016   CO2 32 01/04/2016   TSH 1.31 06/27/2015   PSA 0.75 06/27/2015   INR 0.88 06/06/2015   HGBA1C 6.4 01/04/2016   MICROALBUR 33.2 (H) 06/27/2015   For tramadol prn,  to f/u any worsening symptoms or concerns

## 2016-05-05 NOTE — Assessment & Plan Note (Signed)
stable overall by history and exam, recent data reviewed with pt, and pt to continue medical treatment as before,  to f/u any worsening symptoms or concerns Lab Results  Component Value Date   LDLCALC 43 01/04/2016

## 2016-05-12 ENCOUNTER — Other Ambulatory Visit: Payer: Self-pay | Admitting: Internal Medicine

## 2016-05-14 ENCOUNTER — Other Ambulatory Visit: Payer: Self-pay | Admitting: Internal Medicine

## 2016-05-17 ENCOUNTER — Other Ambulatory Visit: Payer: Self-pay

## 2016-05-17 NOTE — Patient Outreach (Signed)
   Unsuccessful attempt made to contact patient via telephone. HIPPA compliant message left for patient at number with this RNCM's contact information and purpose of call.  Plan: Discharge patient from caseload due to inability of previous case manager and this RNCM's failed attempts

## 2016-06-05 DIAGNOSIS — G4733 Obstructive sleep apnea (adult) (pediatric): Secondary | ICD-10-CM | POA: Diagnosis not present

## 2016-06-11 ENCOUNTER — Other Ambulatory Visit: Payer: Self-pay | Admitting: Physician Assistant

## 2016-06-11 ENCOUNTER — Other Ambulatory Visit: Payer: Self-pay | Admitting: Internal Medicine

## 2016-06-15 ENCOUNTER — Other Ambulatory Visit: Payer: Self-pay | Admitting: Internal Medicine

## 2016-06-18 ENCOUNTER — Other Ambulatory Visit: Payer: Self-pay

## 2016-06-18 NOTE — Patient Outreach (Signed)
      Unsuccessful attempt made to contact member to follow up with message received requesting information on medication assistance. HIPPA compliance message left with this patient with this RNCM's contact information.

## 2016-06-19 ENCOUNTER — Telehealth: Payer: Self-pay | Admitting: Internal Medicine

## 2016-06-19 NOTE — Telephone Encounter (Signed)
Spoke to patient answered his questions that was needed.

## 2016-06-19 NOTE — Telephone Encounter (Signed)
Pt request to speak to the assistant concern about medication. He stating that his insurance change now and he will not be able to afford some of his meds due to the cost. Please give him a call back.

## 2016-06-21 ENCOUNTER — Other Ambulatory Visit: Payer: Self-pay | Admitting: Internal Medicine

## 2016-06-26 ENCOUNTER — Telehealth: Payer: Self-pay | Admitting: *Deleted

## 2016-06-26 MED ORDER — AMLODIPINE BESYLATE 10 MG PO TABS: 10.0000 mg | ORAL_TABLET | Freq: Every day | ORAL | 2 refills | Status: DC

## 2016-06-26 MED ORDER — ROSUVASTATIN CALCIUM 40 MG PO TABS
40.0000 mg | ORAL_TABLET | Freq: Every day | ORAL | 2 refills | Status: DC
Start: 2016-06-26 — End: 2017-03-29

## 2016-06-26 MED ORDER — CLOPIDOGREL BISULFATE 75 MG PO TABS: ORAL_TABLET | ORAL | 2 refills | Status: DC

## 2016-06-26 MED ORDER — CLONIDINE HCL 0.3 MG PO TABS: 0.3000 mg | ORAL_TABLET | Freq: Two times a day (BID) | ORAL | 2 refills | Status: DC

## 2016-06-26 MED ORDER — ESCITALOPRAM OXALATE 20 MG PO TABS: 20.0000 mg | ORAL_TABLET | Freq: Every day | ORAL | 2 refills | Status: DC

## 2016-06-26 MED ORDER — LOSARTAN POTASSIUM 100 MG PO TABS: ORAL_TABLET | ORAL | 2 refills | Status: DC

## 2016-06-26 MED ORDER — POTASSIUM CHLORIDE ER 10 MEQ PO TBCR: EXTENDED_RELEASE_TABLET | ORAL | 2 refills | Status: DC

## 2016-06-26 MED ORDER — GLIMEPIRIDE 1 MG PO TABS: ORAL_TABLET | ORAL | 2 refills | Status: DC

## 2016-06-26 NOTE — Telephone Encounter (Signed)
Pt came to the office need refills sent to [facility]. Wanting to make sure the generic plavix would be covered. Inform pt we had received fax from [facility] too, will call them to see the copay for meds. Pt stated he was told the generic plavix would be $246, and he could not afford. Inform pt soon as I talk with Mcarthur Rossetti will call him to let him know status. Called [employer] spoke w/pharmacist Rob. Verified copay's on meds. Per Rob the only med he would have a copay for is the Potassium Chloride which would be $60.64 for 90 day. The only way he would pay $246 for the generic plavix if receive through local pharmacy. Gave verbal auth for all maintenance meds. Called pt friend (Halo) per pt request because his phone keep dropping calls. Gave him status on meds. rx's sent to [facility]...Johny Chess

## 2016-06-26 NOTE — Telephone Encounter (Signed)
Pt left msg on triage needing to speak w/nurse concerning his medications. Called pt back several times his phone keep dropping calls, and he stated he would just come to the office. He has about 3 medications that he is needing change due to insurance not covering...[name] [name]

## 2016-07-04 ENCOUNTER — Other Ambulatory Visit: Payer: Self-pay | Admitting: Cardiovascular Disease

## 2016-07-05 ENCOUNTER — Other Ambulatory Visit: Payer: Self-pay | Admitting: Cardiovascular Disease

## 2016-07-05 NOTE — Telephone Encounter (Signed)
I have tried to contact patient to verify if he actually wants this rx to be sent to his local pharmacy actually reviewing note below. I was not able to reach patient so I will refill this and if he does not want to pick it up the pharmacy can return it to stock.  Earnstine Regal, MA      06/26/16 5:05 PM  Note    Pt came to the office need refills sent to [facility]. Wanting to make sure the generic plavix would be covered. Inform pt we had received fax from [facility] too, will call them to see the copay for meds. Pt stated he was told the generic plavix would be $246, and he could not afford. Inform pt soon as I talk with Mcarthur Rossetti will call him to let him know status. Called [employer] spoke w/pharmacist Rob. Verified copay's on meds. Per Rob the only med he would have a copay for is the Potassium Chloride which would be $60.64 for 90 day. The only way he would pay $246 for the generic plavix if receive through local pharmacy. Gave verbal auth for all maintenance meds. Called pt friend (Halo) per pt request because his phone keep dropping calls. Gave him status on meds. rx's sent to [facility]...Johny Chess    Earnstine Regal, MA      06/26/16 10:31 AM  Note    Pt left msg on triage needing to speak w/nurse concerning his medications. Called pt back several times his phone keep dropping calls, and he stated he would just come to the office. He has about 3 medications that he is needing change due to insurance not covering...Johny Chess

## 2016-07-10 ENCOUNTER — Other Ambulatory Visit: Payer: Self-pay | Admitting: Internal Medicine

## 2016-07-10 ENCOUNTER — Telehealth: Payer: Self-pay

## 2016-07-10 MED ORDER — PANTOPRAZOLE SODIUM 40 MG PO TBEC: 40.0000 mg | DELAYED_RELEASE_TABLET | Freq: Every day | ORAL | 6 refills | Status: DC

## 2016-07-10 NOTE — Telephone Encounter (Signed)
Pantoprazole medication refill sent to [facility] per request

## 2016-09-05 ENCOUNTER — Ambulatory Visit (INDEPENDENT_AMBULATORY_CARE_PROVIDER_SITE_OTHER): Payer: Medicare HMO | Admitting: Internal Medicine

## 2016-09-05 ENCOUNTER — Encounter: Payer: Self-pay | Admitting: Internal Medicine

## 2016-09-05 VITALS — BP 130/80 | HR 95 | Temp 98.0°F | Ht 72.0 in | Wt 295.0 lb

## 2016-09-05 DIAGNOSIS — E1159 Type 2 diabetes mellitus with other circulatory complications: Secondary | ICD-10-CM | POA: Diagnosis not present

## 2016-09-05 DIAGNOSIS — M5416 Radiculopathy, lumbar region: Secondary | ICD-10-CM

## 2016-09-05 DIAGNOSIS — I10 Essential (primary) hypertension: Secondary | ICD-10-CM | POA: Diagnosis not present

## 2016-09-05 DIAGNOSIS — M48061 Spinal stenosis, lumbar region without neurogenic claudication: Secondary | ICD-10-CM | POA: Insufficient documentation

## 2016-09-05 DIAGNOSIS — G4733 Obstructive sleep apnea (adult) (pediatric): Secondary | ICD-10-CM | POA: Diagnosis not present

## 2016-09-05 DIAGNOSIS — M48062 Spinal stenosis, lumbar region with neurogenic claudication: Secondary | ICD-10-CM

## 2016-09-05 MED ORDER — GABAPENTIN 300 MG PO CAPS: ORAL_CAPSULE | ORAL | 5 refills | Status: DC

## 2016-09-05 MED ORDER — GABAPENTIN 100 MG PO CAPS: 100.0000 mg | ORAL_CAPSULE | Freq: Three times a day (TID) | ORAL | 0 refills | Status: DC

## 2016-09-05 MED ORDER — OXYCODONE HCL 5 MG PO TABS: 5.0000 mg | ORAL_TABLET | ORAL | 0 refills | Status: DC | PRN

## 2016-09-05 MED ORDER — PREDNISONE 10 MG PO TABS: ORAL_TABLET | ORAL | 0 refills | Status: DC

## 2016-09-05 NOTE — Progress Notes (Signed)
Subjective:    Patient ID: [name] [name], male    DOB: 1955/12/28, 61 y.o.   MRN: [id number]  HPI  Here with subacute on chronic lbp gradually worse x 6 wks, now severe, located both midline and left/right back but even worse on the left with neuritic pain radiating to the distal Left leg; has known severe lumbar spine dz after dec 2016 CT.  Pain now constant, mild at rest, mod to stand, but severe with walking and now limited to about 50 ft before having to sit, which can make better.  Nothing else makes better or worse.  No fever and Pt denies bowel or bladder change, fever, wt loss, or falls. Denies worsening reflux, abd pain, dysphagia, n/v, bowel change or blood.  .Denies urinary symptoms such as dysuria, frequency, urgency, flank pain, hematuria or n/v, fever, chills. Past Medical History:  Diagnosis Date  . CAD (coronary artery disease)    a. s/p PCI/BMS pRCA 2002; b. PCI pRCA & DES p/m RCA 2006; c. PCI/DES OM4 2007; d. PCI/CBA to RCA for ISR 10/2006; e.08/2012 STEMI/Cath/PCI:  LAD 95% >> PCI: 3.0x20 Promus Prem DES  //  f. NSTEMI 10/15 >> LHC: mLAD stent ok, dLAD 70, OM1 CTO, OM2 stent ok, dOM2 90, OM3 30-40, dLCx 90, p-mRCA stent ok, mRCA stent ok w/ 60-70 ISR, EF 50% >> med Rx  . Chronic combined systolic and diastolic CHF (congestive heart failure) ([place])    a. Myoview 1/17: EF 35%  //  b. Echo 1/17 EF 45-50%  . Depression   . Diabetes mellitus ([place])    a. A1c 8.8 08/2012->Metformin initiated. => b. A1c (9/14): 6.6  . GERD (gastroesophageal reflux disease)   . History of nuclear stress test    a. Myoview 1/17: EF 35%, fixed inferior lateral defect consistent with infarct, no ischemia, intermediate risk  . History of pneumonia   . HTN (hypertension)   . Hyperlipidemia   . Ischemic cardiomyopathy    a. EF 40%; improved to normal;  b. 08/2012 Echo: EF 50-55%, mod LVH.//  c. Echo 9/16: Inf HK, mild LVH, EF 55%, mild LAE, normal RVF, mild RAE, PASP 35 mmHg  //  d. Echo 1/17: EF 45-50%,  inferior HK, mild BAE, PASP 33 mmHg  . NSTEMI (non-ST elevated myocardial infarction) ([place])   . Obesity   . OSA (obstructive sleep apnea)    Does not use CPAP as of 05/2011  . Tobacco abuse    Past Surgical History:  Procedure Laterality Date  . CORONARY ANGIOPLASTY WITH STENT PLACEMENT    . CORONARY STENT PLACEMENT  2009  . LEFT HEART CATHETERIZATION WITH CORONARY ANGIOGRAM N/A 08/31/2012   Procedure: LEFT HEART CATHETERIZATION WITH CORONARY ANGIOGRAM;  Surgeon: Burnell Blanks, MD;  [facility] C[facility] LAB;  Service: Cardiovascular;  Laterality: N/A;  . LEFT HEART CATHETERIZATION WITH CORONARY ANGIOGRAM N/A 11/16/2013   Procedure: LEFT HEART CATHETERIZATION WITH CORONARY ANGIOGRAM;  Surgeon: Blane Ohara, MD;  Location: [facility] CATH LAB;  Service: Cardiovascular;  Laterality: N/A;  . LEFT HEART CATHETERIZATION WITH CORONARY ANGIOGRAM N/A 03/15/2014   Procedure: LEFT HEART CATHETERIZATION WITH CORONARY ANGIOGRAM;  Surgeon: Peter M Martinique, MD;  Location: [facility] CATH LAB;  Service: Cardiovascular;  Laterality: N/A;  . PERCUTANEOUS CORONARY STENT INTERVENTION (PCI-S) Right 08/31/2012   Procedure: PERCUTANEOUS CORONARY STENT INTERVENTION (PCI-S);  Surgeon: Burnell Blanks, MD;  Location: [facility] CATH LAB;  Service: Cardiovascular;  Laterality: Right;  . PERCUTANEOUS CORONARY STENT INTERVENTION (PCI-S)  11/16/2013  Procedure: PERCUTANEOUS CORONARY STENT INTERVENTION (PCI-S);  Surgeon: Blane Ohara, MD;  Location: [facility] CATH LAB;  Service: Cardiovascular;;    reports that he has been smoking Cigarettes.  He has a 15.00 pack-year smoking history. He has never used smokeless tobacco. He reports that he does not drink alcohol or use drugs. family history includes Cancer in his mother; Depression in his mother; Hypertension in his mother; Other in his father. Allergies  Allergen Reactions  . Penicillins Other (See Comments)    Unknown childhood reaction   Current Outpatient Prescriptions on File  Prior to Visit  Medication Sig Dispense Refill  . amLODipine (NORVASC) 10 MG tablet Take 1 tablet (10 mg total) by mouth daily. 90 tablet 2  . aspirin 81 MG tablet Take 81 mg by mouth daily.    . carvedilol (COREG) 6.25 MG tablet Take 1 tablet (6.25 mg total) by mouth 2 (two) times daily with a meal. *Please call and schedule an appointment* 60 tablet 4  . cloNIDine (CATAPRES) 0.3 MG tablet Take 1 tablet (0.3 mg total) by mouth 2 (two) times daily. 180 tablet 2  . clopidogrel (PLAVIX) 75 MG tablet Take 1 tablet (75 mg total) by mouth daily. *Please call and schedule an appointment* 30 tablet 3  . escitalopram (LEXAPRO) 20 MG tablet Take 1 tablet (20 mg total) by mouth daily. 90 tablet 2  . furosemide (LASIX) 40 MG tablet Take 2 tablets by mouth every AM and 1 tablet every PM. May take extra tablet for weight gain greater than 3 lbs in one day 100 tablet 3  . glimepiride (AMARYL) 1 MG tablet TAKE 1 TABLET BY MOUTH EVERY DAY WITH BREAKFAST 30 tablet 0  . isosorbide mononitrate (IMDUR) 30 MG 24 hr tablet TAKE 3 TABLETS BY MOUTH DAILY 270 tablet 3  . lactulose (CHRONULAC) 10 GM/15ML solution TAKE 30 ML BY MOUTH TWICE DAILY AS NEEDED 6081 mL 1  . losartan (COZAAR) 100 MG tablet TAKE 1 TABLET(100 MG) BY MOUTH DAILY 90 tablet 2  . Multiple Vitamins-Minerals (MULTIVITAMIN WITH MINERALS) tablet Take 1 tablet by mouth daily. CENTRUM SILVER MENS  50 PLUS    . pantoprazole (PROTONIX) 40 MG tablet Take 1 tablet (40 mg total) by mouth daily. 30 tablet 6  . potassium chloride (K-DUR) 10 MEQ tablet TAKE 2 TABLETS BY MOUTH EVERY MORNING AND 1 TABLET BY MOUTH EVERY EVENING 270 tablet 2  . rosuvastatin (CRESTOR) 40 MG tablet Take 1 tablet (40 mg total) by mouth daily. 90 tablet 2  . traMADol (ULTRAM) 50 MG tablet Take 1 tablet (50 mg total) by mouth every 6 (six) hours as needed. 120 tablet 2  . nitroGLYCERIN (NITROSTAT) 0.4 MG SL tablet PLACE 1 TABLET UNDER THE TONGUE EVERY 5 MINUTES AS NEEDED FOR CHEST PAIN  (Patient not taking: Reported on 09/05/2016) 25 tablet 5   No current facility-administered medications on file prior to visit.    Review of Systems  Constitutional: Negative for unusual diaphoresis or night sweats HENT: Negative for ear swelling or discharge Eyes: Negative for worsening visual haziness  Respiratory: Negative for choking and stridor.   Gastrointestinal: Negative for distension or worsening eructation Genitourinary: Negative for retention or change in urine volume.  Musculoskeletal: Negative for other MSK pain or swelling Skin: Negative for color change and worsening wound Neurological: Negative for tremors and numbness other than noted  Psychiatric/Behavioral: Negative for decreased concentration or agitation other than above   All other system neg per pt    Objective:  Physical Exam BP 130/80 (BP Location: Left Arm, Patient Position: Sitting, Cuff Size: Large)   Pulse 95   Temp 98 F (36.7 C) (Oral)   Ht 6' (1.829 m)   Wt 295 lb (133.8 kg)   SpO2 98%   BMI 40.01 kg/m  VS noted, not ill appearubg Constitutional: Pt appears in no apparent distress HENT: Head: NCAT.  Right Ear: External ear normal.  Left Ear: External ear normal.  Eyes: . Pupils are equal, round, and reactive to light. Conjunctivae and EOM are normal Neck: Normal range of motion. Neck supple.  Cardiovascular: Normal rate and regular rhythm.   Pulmonary/Chest: Effort normal and breath sounds without rales or wheezing.  Abd:  Soft, NT, ND, + BS Neurological: Pt is alert. Not confused , motor 5/5 intact except trace distal LLE weakness, cn 2-12 intact, spine nontender in midline, walks with cane Skin: Skin is warm. No rash, no LE edema Psychiatric: Pt behavior is normal. No agitation.  No other exam findings   MRI LUMBAR SPINE WITHOUT CONTRAST - summary 05/14/2015   FINDINGS: Normal alignment of the lumbar vertebral bodies. They demonstrate normal marrow signal. The intervertebral disc  spaces are fairly well maintained. The last full intervertebral disc space is labeled L5-S1 and this correlates with the plain films. The conus medullaris terminates at the bottom of L1.  No significant paraspinal or retroperitoneal findings. Small renal cysts are noted. Slightly prominent cisterna Chyle.  L1-2:  No significant findings.  Mild facet disease.  L2-3: Diffuse bulging annulus with mild flattening of the ventral thecal sac and mild left lateral recess stenosis. There is a shallow broad-based lateral foraminal and extra foraminal disc protrusion on the left which appears to contact and likely irritates the left L2 nerve root. Moderate facet disease.  L3-4: Broad-based right foraminal and extra foraminal disc protrusion on the right contacting and displacing the right L3 nerve root. There is also a diffuse bulging annulus, short pedicles and facet disease contributing to mild spinal and bilateral lateral recess stenosis.  L4-5: Diffuse bulging annulus and central disc protrusion in combination with short pedicles and advanced facet disease contributing to moderate to moderately severe spinal and bilateral lateral recess stenosis. No significant foraminal stenosis.  L5-S1: Advanced facet disease but no disc protrusions, spinal or foraminal stenosis.  IMPRESSION: 1. Multilevel disc disease and facet disease. 2. Shallow broad-based lateral foraminal and extra foraminal disc protrusion on the left at L2-3 likely effecting the left L2 nerve root. 3. Broad-based right foraminal and extra foraminal disc protrusion at L3-4 with mass effect on the right L3 nerve root. There is also multifactorial mild spinal and bilateral lateral recess stenosis at this level. 4. Multifactorial moderate to moderately severe spinal and bilateral lateral recess stenosis at L4-5.       Assessment & Plan:

## 2016-09-05 NOTE — Progress Notes (Signed)
Pre visit review using our clinic review tool, if applicable. No additional management support is needed unless otherwise documented below in the visit note. 

## 2016-09-05 NOTE — Patient Instructions (Signed)
Please take all new medication as prescribed - the gabapentin by starting 100 mg three times daily for 1 wk, THEN start the 300 mg three times daily  Please take all new medication as prescribed - the prednisone, as well as the oxycodone pain medication  Please start the Colace 100 mg up to twice per day as needed for constipation  You will be contacted regarding the referral for: orthopedic asap  Please continue all other medications as before, and refills have been done if requested.  Please have the pharmacy call with any other refills you may need.  Please keep your appointments with your specialists as you may have planned

## 2016-09-06 NOTE — Assessment & Plan Note (Addendum)
Also with worsening LLE radicular pain, ok to start gabapentin, also for predac asd trial

## 2016-09-06 NOTE — Assessment & Plan Note (Signed)
stable overall by history and exam, recent data reviewed with pt, and pt to continue medical treatment as before,  to f/u any worsening symptoms or concerns Lab Results  Component Value Date   HGBA1C 6.4 01/04/2016   Pt to call for cbg > 200 with tx

## 2016-09-06 NOTE — Assessment & Plan Note (Signed)
With worsening pain, for oxycodone prn asd, will hold on f/u MRI but will need surgical referral asap

## 2016-09-06 NOTE — Assessment & Plan Note (Signed)
stable overall by history and exam, recent data reviewed with pt, and pt to continue medical treatment as before,  to f/u any worsening symptoms or concerns BP Readings from Last 3 Encounters:  09/05/16 130/80  05/04/16 136/86  01/05/16 110/82

## 2016-09-08 ENCOUNTER — Other Ambulatory Visit: Payer: Self-pay | Admitting: Physician Assistant

## 2016-09-08 DIAGNOSIS — I5032 Chronic diastolic (congestive) heart failure: Secondary | ICD-10-CM

## 2016-09-10 ENCOUNTER — Other Ambulatory Visit: Payer: Self-pay | Admitting: Internal Medicine

## 2016-09-10 NOTE — Telephone Encounter (Signed)
Please forward to PCP to address potassium Rx.  Dr [name] sent in most recent Rx on 06/26/16.

## 2016-09-10 NOTE — Telephone Encounter (Signed)
Please advise on how patient should currently be taking this medication. It is not listed on last office visit 12/27/15, but there is a phone note from 12/22/16 that states that the patient should resume potassium at previous dose of 20 meq bid. Snapshot has it ordered as 20 meq in the AM and 10 meq in the PM which was by pcp. Thanks, MI

## 2016-09-19 ENCOUNTER — Other Ambulatory Visit: Payer: Self-pay | Admitting: Internal Medicine

## 2016-09-28 ENCOUNTER — Other Ambulatory Visit: Payer: Self-pay | Admitting: Internal Medicine

## 2016-09-28 ENCOUNTER — Other Ambulatory Visit: Payer: Self-pay | Admitting: Physician Assistant

## 2016-10-01 DIAGNOSIS — M5441 Lumbago with sciatica, right side: Secondary | ICD-10-CM | POA: Diagnosis not present

## 2016-10-04 ENCOUNTER — Other Ambulatory Visit: Payer: Self-pay | Admitting: Physician Assistant

## 2016-10-04 DIAGNOSIS — I2510 Atherosclerotic heart disease of native coronary artery without angina pectoris: Secondary | ICD-10-CM

## 2016-10-12 DIAGNOSIS — M5441 Lumbago with sciatica, right side: Secondary | ICD-10-CM | POA: Diagnosis not present

## 2016-10-16 DIAGNOSIS — M5441 Lumbago with sciatica, right side: Secondary | ICD-10-CM | POA: Diagnosis not present

## 2016-10-16 DIAGNOSIS — M5126 Other intervertebral disc displacement, lumbar region: Secondary | ICD-10-CM | POA: Diagnosis not present

## 2016-11-14 ENCOUNTER — Other Ambulatory Visit: Payer: Self-pay | Admitting: Internal Medicine

## 2016-12-11 ENCOUNTER — Other Ambulatory Visit: Payer: Self-pay | Admitting: Internal Medicine

## 2016-12-11 ENCOUNTER — Other Ambulatory Visit: Payer: Self-pay | Admitting: Cardiovascular Disease

## 2016-12-11 ENCOUNTER — Other Ambulatory Visit: Payer: Self-pay | Admitting: Physician Assistant

## 2016-12-11 DIAGNOSIS — I2510 Atherosclerotic heart disease of native coronary artery without angina pectoris: Secondary | ICD-10-CM

## 2016-12-13 ENCOUNTER — Other Ambulatory Visit: Payer: Self-pay | Admitting: Physician Assistant

## 2016-12-13 DIAGNOSIS — I2510 Atherosclerotic heart disease of native coronary artery without angina pectoris: Secondary | ICD-10-CM

## 2016-12-15 ENCOUNTER — Other Ambulatory Visit: Payer: Self-pay | Admitting: Physician Assistant

## 2016-12-15 DIAGNOSIS — I2510 Atherosclerotic heart disease of native coronary artery without angina pectoris: Secondary | ICD-10-CM

## 2016-12-18 ENCOUNTER — Encounter: Payer: Self-pay | Admitting: Internal Medicine

## 2016-12-18 ENCOUNTER — Ambulatory Visit (INDEPENDENT_AMBULATORY_CARE_PROVIDER_SITE_OTHER): Payer: Medicare HMO | Admitting: Internal Medicine

## 2016-12-18 VITALS — BP 128/84 | HR 65 | Ht 72.0 in | Wt 305.0 lb

## 2016-12-18 DIAGNOSIS — M545 Low back pain, unspecified: Secondary | ICD-10-CM

## 2016-12-18 DIAGNOSIS — I10 Essential (primary) hypertension: Secondary | ICD-10-CM | POA: Diagnosis not present

## 2016-12-18 DIAGNOSIS — R062 Wheezing: Secondary | ICD-10-CM | POA: Diagnosis not present

## 2016-12-18 DIAGNOSIS — J309 Allergic rhinitis, unspecified: Secondary | ICD-10-CM | POA: Diagnosis not present

## 2016-12-18 DIAGNOSIS — G8929 Other chronic pain: Secondary | ICD-10-CM | POA: Diagnosis not present

## 2016-12-18 MED ORDER — TRIAMCINOLONE ACETONIDE 55 MCG/ACT NA AERO: 2.0000 | INHALATION_SPRAY | Freq: Every day | NASAL | 12 refills | Status: DC

## 2016-12-18 MED ORDER — TRAMADOL HCL 50 MG PO TABS: 50.0000 mg | ORAL_TABLET | Freq: Four times a day (QID) | ORAL | 2 refills | Status: DC | PRN

## 2016-12-18 MED ORDER — ALBUTEROL SULFATE HFA 108 (90 BASE) MCG/ACT IN AERS: 2.0000 | INHALATION_SPRAY | Freq: Four times a day (QID) | RESPIRATORY_TRACT | 2 refills | Status: DC | PRN

## 2016-12-18 MED ORDER — NITROGLYCERIN 0.4 MG SL SUBL: SUBLINGUAL_TABLET | SUBLINGUAL | 5 refills | Status: DC

## 2016-12-18 MED ORDER — CETIRIZINE HCL 10 MG PO TABS: 10.0000 mg | ORAL_TABLET | Freq: Every day | ORAL | 11 refills | Status: DC

## 2016-12-18 MED ORDER — PREDNISONE 10 MG PO TABS: ORAL_TABLET | ORAL | 0 refills | Status: DC

## 2016-12-18 NOTE — Patient Instructions (Addendum)
You had the steroid shot today  Please take all new medication as prescribed - the prednisone, but also the zyrtec, nasacort, and Inhaler if  Needed  Please continue all other medications as before, and refills have been done if requested - the tramadol  Please have the pharmacy call with any other refills you may need.  Please continue your efforts at being more active, low cholesterol diabetic diet, and weight control.  Please keep your appointments with your specialists as you may have planned  Please return in 6 months, or sooner if needed

## 2016-12-20 ENCOUNTER — Other Ambulatory Visit: Payer: Self-pay | Admitting: Internal Medicine

## 2016-12-20 NOTE — Assessment & Plan Note (Signed)
stable overall by history and exam, for tramadol prn, and pt to continue medical treatment as before,  to f/u any worsening symptoms or concerns

## 2016-12-20 NOTE — Assessment & Plan Note (Signed)
stable overall by history and exam, recent data reviewed with pt, and pt to continue medical treatment as before,  to f/u any worsening symptoms or concerns BP Readings from Last 3 Encounters:  12/18/16 128/84  09/05/16 130/80  05/04/16 136/86

## 2016-12-20 NOTE — Progress Notes (Signed)
Subjective:    Patient ID: [name] [name], male    DOB: 1955/11/25, 61 y.o.   MRN: [id number]  HPI   Here to f/u with several wks  In fact about 2 mo ongoing nasal allergy symptoms with clearish congestion, itch and sneezing, without fever, pain, ST, cough, swelling, except for last 2-3 days has developed mild wheezing and sob as well.  Pt denies new neurological symptoms such as new headache, or facial or extremity weakness or numbness   Pt denies polydipsia, polyuria, or low sugar symptoms such as weakness or confusion improved with po intake.  Pt states overall good compliance with meds, trying to follow lower cholesterol, diabetic diet, but wt overall increased from 267 to 300. Pt continues to have recurring LBP without change in severity, bowel or bladder change, fever, wt loss,  worsening LE pain/numbness/weakness, gait change or falls, and asks for tramadol Past Medical History:  Diagnosis Date  . CAD (coronary artery disease)    a. s/p PCI/BMS pRCA 2002; b. PCI pRCA & DES p/m RCA 2006; c. PCI/DES OM4 2007; d. PCI/CBA to RCA for ISR 10/2006; e.08/2012 STEMI/Cath/PCI:  LAD 95% >> PCI: 3.0x20 Promus Prem DES  //  f. NSTEMI 10/15 >> LHC: mLAD stent ok, dLAD 70, OM1 CTO, OM2 stent ok, dOM2 90, OM3 30-40, dLCx 90, p-mRCA stent ok, mRCA stent ok w/ 60-70 ISR, EF 50% >> med Rx  . Chronic combined systolic and diastolic CHF (congestive heart failure) ([place])    a. Myoview 1/17: EF 35%  //  b. Echo 1/17 EF 45-50%  . Depression   . Diabetes mellitus ([place])    a. A1c 8.8 08/2012->Metformin initiated. => b. A1c (9/14): 6.6  . GERD (gastroesophageal reflux disease)   . History of nuclear stress test    a. Myoview 1/17: EF 35%, fixed inferior lateral defect consistent with infarct, no ischemia, intermediate risk  . History of pneumonia   . HTN (hypertension)   . Hyperlipidemia   . Ischemic cardiomyopathy    a. EF 40%; improved to normal;  b. 08/2012 Echo: EF 50-55%, mod LVH.//  c. Echo 9/16: Inf HK, mild  LVH, EF 55%, mild LAE, normal RVF, mild RAE, PASP 35 mmHg  //  d. Echo 1/17: EF 45-50%, inferior HK, mild BAE, PASP 33 mmHg  . NSTEMI (non-ST elevated myocardial infarction) ([place])   . Obesity   . OSA (obstructive sleep apnea)    Does not use CPAP as of 05/2011  . Tobacco abuse    Past Surgical History:  Procedure Laterality Date  . CORONARY ANGIOPLASTY WITH STENT PLACEMENT    . CORONARY STENT PLACEMENT  2009  . LEFT HEART CATHETERIZATION WITH CORONARY ANGIOGRAM N/A 08/31/2012   Procedure: LEFT HEART CATHETERIZATION WITH CORONARY ANGIOGRAM;  Surgeon: Burnell Blanks, MD;  Location: [facility] CATH LAB;  Service: Cardiovascular;  Laterality: N/A;  . LEFT HEART CATHETERIZATION WITH CORONARY ANGIOGRAM N/A 11/16/2013   Procedure: LEFT HEART CATHETERIZATION WITH CORONARY ANGIOGRAM;  Surgeon: Blane Ohara, MD;  Location: [facility] CATH LAB;  Service: Cardiovascular;  Laterality: N/A;  . LEFT HEART CATHETERIZATION WITH CORONARY ANGIOGRAM N/A 03/15/2014   Procedure: LEFT HEART CATHETERIZATION WITH CORONARY ANGIOGRAM;  Surgeon: Peter M Martinique, MD;  Location: [facility] CATH LAB;  Service: Cardiovascular;  Laterality: N/A;  . PERCUTANEOUS CORONARY STENT INTERVENTION (PCI-S) Right 08/31/2012   Procedure: PERCUTANEOUS CORONARY STENT INTERVENTION (PCI-S);  Surgeon: Burnell Blanks, MD;  Location: [facility] CATH LAB;  Service: Cardiovascular;  Laterality: Right;  . PERCUTANEOUS  CORONARY STENT INTERVENTION (PCI-S)  11/16/2013   Procedure: PERCUTANEOUS CORONARY STENT INTERVENTION (PCI-S);  Surgeon: Blane Ohara, MD;  Location: [facility] CATH LAB;  Service: Cardiovascular;;    reports that he has been smoking Cigarettes.  He has a 15.00 pack-year smoking history. He has never used smokeless tobacco. He reports that he does not drink alcohol or use drugs. family history includes Cancer in his mother; Coronary artery disease in his unknown relative; Depression in his mother; Hypertension in his mother; Other in his father. Allergies    Allergen Reactions  . Penicillins Other (See Comments)    Unknown childhood reaction   Current Outpatient Prescriptions on File Prior to Visit  Medication Sig Dispense Refill  . amLODipine (NORVASC) 10 MG tablet Take 1 tablet (10 mg total) by mouth daily. 90 tablet 1  . aspirin 81 MG tablet Take 81 mg by mouth daily.    . carvedilol (COREG) 6.25 MG tablet Take 1 tablet (6.25 mg total) by mouth 2 (two) times daily with a meal. *Please call and schedule an appointment* 60 tablet 4  . cloNIDine (CATAPRES) 0.3 MG tablet Take 1 tablet (0.3 mg total) by mouth 2 (two) times daily. 180 tablet 2  . clopidogrel (PLAVIX) 75 MG tablet Take 1 tablet (75 mg total) by mouth daily. *Please call and schedule an appointment* 30 tablet 3  . escitalopram (LEXAPRO) 20 MG tablet Take 1 tablet (20 mg total) by mouth daily. 90 tablet 2  . furosemide (LASIX) 40 MG tablet SEE NOTES 360 tablet 1  . gabapentin (NEURONTIN) 100 MG capsule TAKE 1 CAPSULE BY MOUTH THREE TIMES DAILY X 1 WEEK, THEN TAKE THE 300 MG DOSING 21 capsule 0  . gabapentin (NEURONTIN) 300 MG capsule TAKE 1 CAPSULE(300 MG) BY MOUTH three times daily 90 capsule 5  . glimepiride (AMARYL) 1 MG tablet TAKE 1 TABLET BY MOUTH EVERY DAY WITH BREAKFAST 30 tablet 0  . isosorbide mononitrate (IMDUR) 30 MG 24 hr tablet TAKE 3 TABLETS BY MOUTH DAILY 45 tablet 0  . lactulose (CHRONULAC) 10 GM/15ML solution TAKE 30 ML BY MOUTH TWICE DAILY AS NEEDED 6081 mL 1  . losartan (COZAAR) 100 MG tablet TAKE 1 TABLET(100 MG) BY MOUTH DAILY 90 tablet 2  . Multiple Vitamins-Minerals (MULTIVITAMIN WITH MINERALS) tablet Take 1 tablet by mouth daily. CENTRUM SILVER MENS  50 PLUS    . oxyCODONE (ROXICODONE) 5 MG immediate release tablet Take 1 tablet (5 mg total) by mouth every 4 (four) hours as needed for severe pain. 180 tablet 0  . pantoprazole (PROTONIX) 40 MG tablet TAKE 1 TABLET(40 MG) BY MOUTH DAILY 90 tablet 0  . rosuvastatin (CRESTOR) 40 MG tablet Take 1 tablet (40 mg  total) by mouth daily. 90 tablet 2   No current facility-administered medications on file prior to visit.    Review of Systems  Constitutional: Negative for other unusual diaphoresis or sweats HENT: Negative for ear discharge or swelling Eyes: Negative for other worsening visual disturbances Respiratory: Negative for stridor or other swelling  Gastrointestinal: Negative for worsening distension or other blood Genitourinary: Negative for retention or other urinary change Musculoskeletal: Negative for other MSK pain or swelling Skin: Negative for color change or other new lesions Neurological: Negative for worsening tremors and other numbness  Psychiatric/Behavioral: Negative for worsening agitation or other fatigue All other system neg per pt    Objective:   Physical Exam BP 128/84   Pulse 65   Ht 6' (1.829 m)   Annell Greening [name])  305 lb (138.3 kg)   SpO2 98%   BMI 41.37 kg/m  VS noted, not ill appearing Constitutional: Pt appears in NAD HENT: Head: NCAT.  Right Ear: External ear normal.  Left Ear: External ear normal.  Eyes: . Pupils are equal, round, and reactive to light. Conjunctivae and EOM are normal Nose: without d/c or deformity Bilat tm's with mild erythema.  Max sinus areas non tender.  Pharynx with mild erythema, no exudate Neck: Neck supple. Gross normal ROM Cardiovascular: Normal rate and regular rhythm.   Pulmonary/Chest: Effort normal and breath sounds without rales but with bilat scattered wheezing.  Spine: diffuse mild low midline tender without rash or swelling Neurological: Pt is alert. At baseline orientation, motor grossly intact Skin: Skin is warm. No rashes, other new lesions, no LE edema Psychiatric: Pt behavior is normal without agitation  No other exam findings     Assessment & Plan:

## 2016-12-20 NOTE — Assessment & Plan Note (Signed)
C/w mild new onset asthma type bronchospasm, no evidence for infection, for albuterol MDI prn, and steroid tx as above

## 2016-12-20 NOTE — Assessment & Plan Note (Addendum)
Mild to mod, for depomedrol IM 80, predpac asd, nasacort and zyrtec asd, to f/u any worsening symptoms or concerns

## 2016-12-27 ENCOUNTER — Other Ambulatory Visit: Payer: Self-pay | Admitting: Internal Medicine

## 2017-01-03 ENCOUNTER — Ambulatory Visit: Payer: Medicare HMO

## 2017-01-03 NOTE — Progress Notes (Deleted)
Pre visit review using our clinic review tool, if applicable. No additional management support is needed unless otherwise documented below in the visit note. 

## 2017-01-03 NOTE — Progress Notes (Deleted)
Subjective:   [name] [name] is a 61 y.o. male who presents for an Initial Medicare Annual Wellness Visit.  Review of Systems  No ROS.  Medicare Wellness Visit. Additional risk factors are reflected in the social history.    Sleep patterns: {SX; SLEEP PATTERNS:18802::"feels rested on waking","does not get up to void","gets up *** times nightly to void","sleeps *** hours nightly"}.   Home Safety/Smoke Alarms: Feels safe in home. Smoke alarms in place.  Living environment; residence and Firearm Safety: {Rehab home environment / accessibility:30080::"no firearms","firearms stored safely"}. Seat Belt Safety/Bike Helmet: Wears seat belt.   Counseling:   Eye Exam-  Dental-  Male:   CCS- Last 03/17/12, recall 10 years    PSA-  Lab Results  Component Value Date   PSA 0.75 06/27/2015   PSA 0.75 01/26/2014   PSA 1.02 06/29/2013      Objective:    There were no vitals filed for this visit. There is no height or weight on file to calculate BMI.  Current Medications (verified) Outpatient Encounter Prescriptions as of 01/03/2017  Medication Sig  . albuterol (PROVENTIL HFA;VENTOLIN HFA) 108 (90 Base) MCG/ACT inhaler Inhale 2 puffs into the lungs every 6 (six) hours as needed for wheezing or shortness of breath.  [name] amLODipine (NORVASC) 10 MG tablet Take 1 tablet (10 mg total) by mouth daily.  [name] aspirin 81 MG tablet Take 81 mg by mouth daily.  . carvedilol (COREG) 6.25 MG tablet Take 1 tablet (6.25 mg total) by mouth 2 (two) times daily with a meal. *Please call and schedule an appointment*  . cetirizine (ZYRTEC) 10 MG tablet Take 1 tablet (10 mg total) by mouth daily.  . cloNIDine (CATAPRES) 0.3 MG tablet Take 1 tablet (0.3 mg total) by mouth 2 (two) times daily.  . clopidogrel (PLAVIX) 75 MG tablet Take 1 tablet (75 mg total) by mouth daily. *Please call and schedule an appointment*  . escitalopram (LEXAPRO) 20 MG tablet Take 1 tablet (20 mg total) by mouth daily.  . furosemide (LASIX)  40 MG tablet SEE NOTES  . gabapentin (NEURONTIN) 100 MG capsule TAKE 1 CAPSULE BY MOUTH THREE TIMES DAILY X 1 WEEK, THEN TAKE THE 300 MG DOSING  . gabapentin (NEURONTIN) 300 MG capsule TAKE 1 CAPSULE(300 MG) BY MOUTH three times daily  . glimepiride (AMARYL) 1 MG tablet TAKE 1 TABLET BY MOUTH EVERY DAY WITH BREAKFAST  . isosorbide mononitrate (IMDUR) 30 MG 24 hr tablet TAKE 3 TABLETS BY MOUTH DAILY  . lactulose (CHRONULAC) 10 GM/15ML solution TAKE 30 ML BY MOUTH TWICE DAILY AS NEEDED  . losartan (COZAAR) 100 MG tablet TAKE 1 TABLET(100 MG) BY MOUTH DAILY  . Multiple Vitamins-Minerals (MULTIVITAMIN WITH MINERALS) tablet Take 1 tablet by mouth daily. CENTRUM SILVER MENS  50 PLUS  . nitroGLYCERIN (NITROSTAT) 0.4 MG SL tablet PLACE 1 TABLET UNDER THE TONGUE EVERY 5 MINUTES AS NEEDED FOR CHEST PAIN  . oxyCODONE (ROXICODONE) 5 MG immediate release tablet Take 1 tablet (5 mg total) by mouth every 4 (four) hours as needed for severe pain.  . pantoprazole (PROTONIX) 40 MG tablet TAKE 1 TABLET(40 MG) BY MOUTH DAILY  . potassium chloride (K-DUR) 10 MEQ tablet TAKE 2 TABLETS BY MOUTH EVERY MORNING AND 1 TABLET BY MOUTH EVERY EVENING  . predniSONE (DELTASONE) 10 MG tablet 3 tabs by mouth per day for 3 days,2tabs per day for 3 days,1tab per day for 3 days  . rosuvastatin (CRESTOR) 40 MG tablet Take 1 tablet (40 mg total) by  mouth daily.  . traMADol (ULTRAM) 50 MG tablet Take 1 tablet (50 mg total) by mouth every 6 (six) hours as needed.  . triamcinolone (NASACORT) 55 MCG/ACT AERO nasal inhaler Place 2 sprays into the nose daily.   No facility-administered encounter medications on file as of 01/03/2017.     Allergies (verified) Penicillins   History: Past Medical History:  Diagnosis Date  . CAD (coronary artery disease)    a. s/p PCI/BMS pRCA 2002; b. PCI pRCA & DES p/m RCA 2006; c. PCI/DES OM4 2007; d. PCI/CBA to RCA for ISR 10/2006; e.08/2012 STEMI/Cath/PCI:  LAD 95% >> PCI: 3.0x20 Promus Prem DES  //  f.  NSTEMI 10/15 >> LHC: mLAD stent ok, dLAD 70, OM1 CTO, OM2 stent ok, dOM2 90, OM3 30-40, dLCx 90, p-mRCA stent ok, mRCA stent ok w/ 60-70 ISR, EF 50% >> med Rx  . Chronic combined systolic and diastolic CHF (congestive heart failure) ([place])    a. Myoview 1/17: EF 35%  //  b. Echo 1/17 EF 45-50%  . Depression   . Diabetes mellitus ([place])    a. A1c 8.8 08/2012->Metformin initiated. => b. A1c (9/14): 6.6  . GERD (gastroesophageal reflux disease)   . History of nuclear stress test    a. Myoview 1/17: EF 35%, fixed inferior lateral defect consistent with infarct, no ischemia, intermediate risk  . History of pneumonia   . HTN (hypertension)   . Hyperlipidemia   . Ischemic cardiomyopathy    a. EF 40%; improved to normal;  b. 08/2012 Echo: EF 50-55%, mod LVH.//  c. Echo 9/16: Inf HK, mild LVH, EF 55%, mild LAE, normal RVF, mild RAE, PASP 35 mmHg  //  d. Echo 1/17: EF 45-50%, inferior HK, mild BAE, PASP 33 mmHg  . NSTEMI (non-ST elevated myocardial infarction) ([place])   . Obesity   . OSA (obstructive sleep apnea)    Does not use CPAP as of 05/2011  . Tobacco abuse    Past Surgical History:  Procedure Laterality Date  . CORONARY ANGIOPLASTY WITH STENT PLACEMENT    . CORONARY STENT PLACEMENT  2009  . LEFT HEART CATHETERIZATION WITH CORONARY ANGIOGRAM N/A 08/31/2012   Procedure: LEFT HEART CATHETERIZATION WITH CORONARY ANGIOGRAM;  Surgeon: Burnell Blanks, MD;  Location[facility] CATH LAB;  Service: Cardiovascular;  Laterality: N/A;  . LEFT HEART CATHETERIZATION WITH CORONARY ANGIOGRAM N/A 11/16/2013   Procedure: LEFT HEART CATHETERIZATION WITH CORONARY ANGIOGRAM;  Surgeon: Blane Ohara, MD;  Location: [facility] CATH LAB;  Service: Cardiovascular;  Laterality: N/A;  . LEFT HEART CATHETERIZATION WITH CORONARY ANGIOGRAM N/A 03/15/2014   Procedure: LEFT HEART CATHETERIZATION WITH CORONARY ANGIOGRAM;  Surgeon: Peter M Martinique, MD;  Location: [facility] CATH LAB;  Service: Cardiovascular;  Laterality: N/A;  . PERCUTANEOUS  CORONARY STENT INTERVENTION (PCI-S) Right 08/31/2012   Procedure: PERCUTANEOUS CORONARY STENT INTERVENTION (PCI-S);  Surgeon: Burnell Blanks, MD;  Location: [facility] CATH LAB;  Service: Cardiovascular;  Laterality: Right;  . PERCUTANEOUS CORONARY STENT INTERVENTION (PCI-S)  11/16/2013   Procedure: PERCUTANEOUS CORONARY STENT INTERVENTION (PCI-S);  Surgeon: Blane Ohara, MD;  Location: [facility] CATH LAB;  Service: Cardiovascular;;   Family History  Problem Relation Age of Onset  . Coronary artery disease Unknown   . Depression Mother   . Cancer Mother        ovarian  . Hypertension Mother        Died, 1  . Other Father        motor vehicle accident  . Heart attack  Neg Hx   . Stroke Neg Hx    Social History   Occupational History  . retired    Social History Main Topics  . Smoking status: Current Every Day Smoker    Packs/day: 0.50    Years: 30.00    Types: Cigarettes  . Smokeless tobacco: Never Used     Comment: trying to cut down  . Alcohol use No  . Drug use: No     Comment: remote marijuana use  . Sexual activity: Not Currently   Tobacco Counseling Ready to quit: Not Answered Counseling given: Not Answered   Activities of Daily Living No flowsheet data found.  Immunizations and Health Maintenance Immunization History  Administered Date(s) Administered  . Influenza Whole 04/12/2008, 04/12/2010, 02/10/2011  . Influenza, Seasonal, Injecte, Preservative Fre 03/11/2013  . Influenza,inj,Quad PF,36+ Mos 02/12/2015, 05/04/2016  . Pneumococcal Conjugate-13 06/26/2013  . Pneumococcal Polysaccharide-23 07/21/2009, 06/27/2015  . Td 03/24/2009   Health Maintenance Due  Topic Date Due  . OPHTHALMOLOGY EXAM  02/14/1967  . HIV Screening  02/14/1972  . FOOT EXAM  06/26/2014  . HEMOGLOBIN A1C  07/06/2016    Patient Care Team: Biagio Borg, MD as PCP - General (Internal Medicine) Sherren Mocha, MD as Consulting Physician (Cardiology) Sharmon Revere as Physician  Assistant (Cardiology) Sueanne Margarita, MD as Consulting Physician (Sleep Medicine)  Indicate any recent Medical Services you may have received from other than [facility] providers in the past year (date may be approximate).    Assessment:   This is a routine wellness examination for [facility]. Physical assessment deferred to PCP.   Hearing/Vision screen No exam data present  Dietary issues and exercise activities discussed:   Diet (meal preparation, eat out, water intake, caffeinated beverages, dairy products, fruits and vegetables): {Desc; diets:16563} Breakfast: Lunch:  Dinner:      Goals    . Reduce sodium intake          Case Manager discussed ways to reduce sodium intake with patient when cooking.  Suggestions:  Low sodium broth to cook vegetables.  Use Mrs. Dash to season and or marinate meats instead of seasoning salt and/or meat tenderizer.      Depression Screen PHQ 2/9 Scores 05/04/2016 12/20/2015 08/23/2015 08/18/2015  PHQ - 2 Score 0 5 0 0  PHQ- 9 Score - 14 - -    Fall Risk Fall Risk  05/04/2016 12/20/2015 08/23/2015 08/18/2015 10/13/2013  Falls in the past year? No No Yes Yes No  Number falls in past yr: - - 2 or more 2 or more -  Injury with Fall? - - Yes No -  Risk Factor Category  - - High Fall Risk High Fall Risk -  Risk for fall due to : - Impaired balance/gait;Impaired mobility;Medication side effect;Impaired vision History of fall(s);Impaired mobility History of fall(s);Impaired mobility;Other (Comment) -  Risk for fall due to (comments): - - - Obesity -  Follow up - - Education provided;Falls prevention discussed Falls evaluation completed;Education provided;Falls prevention discussed -    Cognitive Function:        Screening Tests Health Maintenance  Topic Date Due  . OPHTHALMOLOGY EXAM  02/14/1967  . HIV Screening  02/14/1972  . FOOT EXAM  06/26/2014  . HEMOGLOBIN A1C  07/06/2016  . INFLUENZA VACCINE  01/09/2017  . TETANUS/TDAP  03/25/2019  .  COLONOSCOPY  03/17/2022  . PNEUMOCOCCAL POLYSACCHARIDE VACCINE  Completed  . Hepatitis C Screening  Completed  Plan:     I have personally reviewed and noted the following in the patient's chart:   . Medical and social history . Use of alcohol, tobacco or illicit drugs  . Current medications and supplements . Functional ability and status . Nutritional status . Physical activity . Advanced directives . List of other physicians . Vitals . Screenings to include cognitive, depression, and falls . Referrals and appointments  In addition, I have reviewed and discussed with patient certain preventive protocols, quality metrics, and best practice recommendations. A written personalized care plan for preventive services as well as general preventive health recommendations were provided to patient.     Michiel Cowboy, RN   01/03/2017

## 2017-01-12 ENCOUNTER — Other Ambulatory Visit: Payer: Self-pay | Admitting: Physician Assistant

## 2017-01-12 DIAGNOSIS — I2510 Atherosclerotic heart disease of native coronary artery without angina pectoris: Secondary | ICD-10-CM

## 2017-01-18 ENCOUNTER — Other Ambulatory Visit: Payer: Self-pay | Admitting: Internal Medicine

## 2017-01-18 DIAGNOSIS — I2510 Atherosclerotic heart disease of native coronary artery without angina pectoris: Secondary | ICD-10-CM

## 2017-01-27 ENCOUNTER — Other Ambulatory Visit: Payer: Self-pay | Admitting: Internal Medicine

## 2017-02-02 ENCOUNTER — Other Ambulatory Visit: Payer: Self-pay | Admitting: Internal Medicine

## 2017-03-21 ENCOUNTER — Ambulatory Visit: Payer: Medicare HMO

## 2017-03-29 ENCOUNTER — Ambulatory Visit (INDEPENDENT_AMBULATORY_CARE_PROVIDER_SITE_OTHER): Payer: Medicare HMO | Admitting: *Deleted

## 2017-03-29 ENCOUNTER — Other Ambulatory Visit: Payer: Self-pay | Admitting: Internal Medicine

## 2017-03-29 ENCOUNTER — Telehealth: Payer: Self-pay | Admitting: *Deleted

## 2017-03-29 VITALS — BP 134/85 | HR 58 | Resp 20 | Ht 72.0 in | Wt 304.0 lb

## 2017-03-29 DIAGNOSIS — Z0000 Encounter for general adult medical examination without abnormal findings: Secondary | ICD-10-CM | POA: Diagnosis not present

## 2017-03-29 DIAGNOSIS — E1159 Type 2 diabetes mellitus with other circulatory complications: Secondary | ICD-10-CM | POA: Diagnosis not present

## 2017-03-29 DIAGNOSIS — Z23 Encounter for immunization: Secondary | ICD-10-CM | POA: Diagnosis not present

## 2017-03-29 MED ORDER — NITROGLYCERIN 0.4 MG SL SUBL: SUBLINGUAL_TABLET | SUBLINGUAL | 5 refills | Status: DC

## 2017-03-29 MED ORDER — ROSUVASTATIN CALCIUM 40 MG PO TABS: 40.0000 mg | ORAL_TABLET | Freq: Every day | ORAL | 2 refills | Status: DC

## 2017-03-29 NOTE — Telephone Encounter (Signed)
During AWV, patient stated that he needed medication refills for rosuvastatin and nitroglycerin.

## 2017-03-29 NOTE — Progress Notes (Addendum)
Subjective:   [name] [name] is a 61 y.o. male who presents for Medicare Annual/Subsequent preventive examination.  Review of Systems:  No ROS.  Medicare Wellness Visit. Additional risk factors are reflected in the social history.  Cardiac Risk Factors include: advanced age (>27men, >23 women);diabetes mellitus;dyslipidemia;hypertension;male gender;obesity (BMI >30kg/m2);sedentary lifestyle Sleep patterns: gets up 1-2 times nightly to void and sleeps 6-7 hours nightly. Wears C-PAP Home Safety/Smoke Alarms: Feels safe in home. Smoke alarms in place.  Living environment; residence and Firearm Safety: 1-story house/ trailer, no firearms. Lives alone, no needs for DME, limited support system Seat Belt Safety/Bike Helmet: Wears seat belt.     Objective:    Vitals: BP 134/85   Pulse (!) 58   Resp 20   Ht 6' (1.829 m)   Wt (!) 304 lb (137.9 kg)   SpO2 97%   BMI 41.23 kg/m   Body mass index is 41.23 kg/m.  Tobacco History  Smoking Status  . Current Every Day Smoker  . Packs/day: 0.50  . Years: 30.00  . Types: Cigarettes  Smokeless Tobacco  . Never Used    Comment: trying to cut down     Ready to quit: Not Answered Counseling given: Not Answered   Past Medical History:  Diagnosis Date  . CAD (coronary artery disease)    a. s/p PCI/BMS pRCA 2002; b. PCI pRCA & DES p/m RCA 2006; c. PCI/DES OM4 2007; d. PCI/CBA to RCA for ISR 10/2006; e.08/2012 STEMI/Cath/PCI:  LAD 95% >> PCI: 3.0x20 Promus Prem DES  //  f. NSTEMI 10/15 >> LHC: mLAD stent ok, dLAD 70, OM1 CTO, OM2 stent ok, dOM2 90, OM3 30-40, dLCx 90, p-mRCA stent ok, mRCA stent ok w/ 60-70 ISR, EF 50% >> med Rx  . Chronic combined systolic and diastolic CHF (congestive heart failure) ([place])    a. Myoview 1/17: EF 35%  //  b. Echo 1/17 EF 45-50%  . Depression   . Diabetes mellitus ([place])    a. A1c 8.8 08/2012->Metformin initiated. => b. A1c (9/14): 6.6  . GERD (gastroesophageal reflux disease)   . History of nuclear stress test     a. Myoview 1/17: EF 35%, fixed inferior lateral defect consistent with infarct, no ischemia, intermediate risk  . History of pneumonia   . HTN (hypertension)   . Hyperlipidemia   . Ischemic cardiomyopathy    a. EF 40%; improved to normal;  b. 08/2012 Echo: EF 50-55%, mod LVH.//  c. Echo 9/16: Inf HK, mild LVH, EF 55%, mild LAE, normal RVF, mild RAE, PASP 35 mmHg  //  d. Echo 1/17: EF 45-50%, inferior HK, mild BAE, PASP 33 mmHg  . NSTEMI (non-ST elevated myocardial infarction) ([place])   . Obesity   . OSA (obstructive sleep apnea)    Does not use CPAP as of 05/2011  . Tobacco abuse    Past Surgical History:  Procedure Laterality Date  . CORONARY ANGIOPLASTY WITH STENT PLACEMENT    . CORONARY STENT PLACEMENT  2009  . LEFT HEART CATHETERIZATION WITH CORONARY ANGIOGRAM N/A 08/31/2012   Procedure: LEFT HEART CATHETERIZATION WITH CORONARY ANGIOGRAM;  Surgeon: Burnell Blanks, MD;  Location: [facility] CATH LAB;  Service: Cardiovascular;  Laterality: N/A;  . LEFT HEART CATHETERIZATION WITH CORONARY ANGIOGRAM N/A 11/16/2013   Procedure: LEFT HEART CATHETERIZATION WITH CORONARY ANGIOGRAM;  Surgeon: Blane Ohara, MD;  Location: [facility] CATH LAB;  Service: Cardiovascular;  Laterality: N/A;  . LEFT HEART CATHETERIZATION WITH CORONARY ANGIOGRAM N/A 03/15/2014   Procedure: LEFT  HEART CATHETERIZATION WITH CORONARY ANGIOGRAM;  Surgeon: Peter M Martinique, MD;  Location: [facility] CATH LAB;  Service: Cardiovascular;  Laterality: N/A;  . PERCUTANEOUS CORONARY STENT INTERVENTION (PCI-S) Right 08/31/2012   Procedure: PERCUTANEOUS CORONARY STENT INTERVENTION (PCI-S);  Surgeon: Burnell Blanks, MD;  Location: [facility] CATH LAB;  Service: Cardiovascular;  Laterality: Right;  . PERCUTANEOUS CORONARY STENT INTERVENTION (PCI-S)  11/16/2013   Procedure: PERCUTANEOUS CORONARY STENT INTERVENTION (PCI-S);  Surgeon: Blane Ohara, MD;  Location: [facility] CATH LAB;  Service: Cardiovascular;;   Family History  Problem Relation Age of Onset    . Depression Mother   . Cancer Mother        ovarian  . Hypertension Mother        Died, 59  . Other Father        motor vehicle accident  . Coronary artery disease Unknown   . Heart attack Neg Hx   . Stroke Neg Hx    History  Sexual Activity  . Sexual activity: Not Currently    Outpatient Encounter Prescriptions as of 03/29/2017  Medication Sig  . albuterol (PROVENTIL HFA;VENTOLIN HFA) 108 (90 Base) MCG/ACT inhaler Inhale 2 puffs into the lungs every 6 (six) hours as needed for wheezing or shortness of breath.  [name] aspirin 81 MG tablet Take 81 mg by mouth daily.  . carvedilol (COREG) 6.25 MG tablet Take 1 tablet (6.25 mg total) by mouth 2 (two) times daily with a meal. *Please call and schedule an appointment*  . cetirizine (ZYRTEC) 10 MG tablet Take 1 tablet (10 mg total) by mouth daily.  . cloNIDine (CATAPRES) 0.3 MG tablet Take 1 tablet (0.3 mg total) by mouth 2 (two) times daily.  . clopidogrel (PLAVIX) 75 MG tablet Take 1 tablet (75 mg total) by mouth daily. *Please call and schedule an appointment*  . escitalopram (LEXAPRO) 20 MG tablet Take 1 tablet (20 mg total) by mouth daily.  . furosemide (LASIX) 40 MG tablet SEE NOTES  . gabapentin (NEURONTIN) 100 MG capsule TAKE 1 CAPSULE BY MOUTH THREE TIMES DAILY X 1 WEEK, THEN TAKE THE 300 MG DOSING  . gabapentin (NEURONTIN) 300 MG capsule TAKE 1 CAPSULE(300 MG) BY MOUTH three times daily  . glimepiride (AMARYL) 1 MG tablet TAKE 1 TABLET BY MOUTH EVERY DAY WITH BREAKFAST  . isosorbide mononitrate (IMDUR) 30 MG 24 hr tablet TAKE 3 TABLETS BY MOUTH DAILY  . isosorbide mononitrate (IMDUR) 30 MG 24 hr tablet TAKE 3 TABLETS BY MOUTH DAILY  . lactulose (CHRONULAC) 10 GM/15ML solution TAKE 30 ML BY MOUTH TWICE DAILY AS NEEDED  . losartan (COZAAR) 100 MG tablet TAKE 1 TABLET(100 MG) BY MOUTH DAILY  . Multiple Vitamins-Minerals (MULTIVITAMIN WITH MINERALS) tablet Take 1 tablet by mouth daily. CENTRUM SILVER MENS  50 PLUS  . nitroGLYCERIN  (NITROSTAT) 0.4 MG SL tablet PLACE 1 TABLET UNDER THE TONGUE EVERY 5 MINUTES AS NEEDED FOR CHEST PAIN  . pantoprazole (PROTONIX) 40 MG tablet TAKE 1 TABLET(40 MG) BY MOUTH DAILY  . potassium chloride (K-DUR) 10 MEQ tablet TAKE 2 TABLETS BY MOUTH EVERY MORNING AND 1 TABLET BY MOUTH EVERY EVENING  . rosuvastatin (CRESTOR) 40 MG tablet Take 1 tablet (40 mg total) by mouth daily.  . traMADol (ULTRAM) 50 MG tablet Take 1 tablet (50 mg total) by mouth every 6 (six) hours as needed.  . triamcinolone (NASACORT) 55 MCG/ACT AERO nasal inhaler Place 2 sprays into the nose daily.  . [DISCONTINUED] amLODipine (NORVASC) 10 MG tablet Take 1 tablet (10  mg total) by mouth daily.  . [DISCONTINUED] oxyCODONE (ROXICODONE) 5 MG immediate release tablet Take 1 tablet (5 mg total) by mouth every 4 (four) hours as needed for severe pain. (Patient not taking: Reported on 03/29/2017)  . [DISCONTINUED] predniSONE (DELTASONE) 10 MG tablet 3 tabs by mouth per day for 3 days,2tabs per day for 3 days,1tab per day for 3 days (Patient not taking: Reported on 03/29/2017)   No facility-administered encounter medications on file as of 03/29/2017.     Activities of Daily Living In your present state of health, do you have any difficulty performing the following activities: 03/29/2017  Hearing? N  Vision? N  Difficulty concentrating or making decisions? N  Walking or climbing stairs? N  Dressing or bathing? N  Doing errands, shopping? N  Preparing Food and eating ? N  Using the Toilet? N  In the past six months, have you accidently leaked urine? N  Do you have problems with loss of bowel control? N  Managing your Medications? N  Managing your Finances? N  Housekeeping or managing your Housekeeping? N  Some recent data might be hidden    Patient Care Team: Biagio Borg, MD as PCP - General (Internal Medicine) Sherren Mocha, MD as Consulting Physician (Cardiology) Sharmon Revere as Physician Assistant  (Cardiology) Sueanne Margarita, MD as Consulting Physician (Sleep Medicine)   Assessment:    Physical assessment deferred to PCP.  Exercise Activities and Dietary recommendations Current Exercise Habits: The patient does not participate in regular exercise at present (chair exercise pamphlets provided), Exercise limited by: orthopedic condition(s)  Diet (meal preparation, eat out, water intake, caffeinated beverages, dairy products, fruits and vegetables): in general, a "healthy" diet     Reviewed heart healthy and diabetic diet, encouraged patient to increase daily water intake. Diet education was provided via handout. Relevant patient education assigned to patient using Emmi.  Goals    . Reduce sodium intake          Case Manager discussed ways to reduce sodium intake with patient when cooking.  Suggestions:  Low sodium broth to cook vegetables.  Use Mrs. Dash to season and or marinate meats instead of seasoning salt and/or meat tenderizer.    . Start to eat healthy and exercise.          Start to read labels, do portion control, increase the amount of water I drink. Increase the amount I socialize, start to walk around my neighborhood. Check into going to the [facility].      Fall Risk Fall Risk  03/29/2017 05/04/2016 12/20/2015 08/23/2015 08/18/2015  Falls in the past year? No No No Yes Yes  Number falls in past yr: - - - 2 or more 2 or more  Injury with Fall? - - - Yes No  Risk Factor Category  - - - High Fall Risk High Fall Risk  Risk for fall due to : - - Impaired balance/gait;Impaired mobility;Medication side effect;Impaired vision History of fall(s);Impaired mobility History of fall(s);Impaired mobility;Other (Comment)  Risk for fall due to: Comment - - - - Obesity  Follow up - - - Education provided;Falls prevention discussed Falls evaluation completed;Education provided;Falls prevention discussed   Depression Screen PHQ 2/9 Scores 03/29/2017 05/04/2016 12/20/2015 08/23/2015    PHQ - 2 Score 4 0 5 0  PHQ- 9 Score 8 - 14 -    Cognitive Function MMSE - Mini Mental State Exam 03/29/2017  Orientation to time 5  Orientation to Place 5  Registration 3  Attention/ Calculation 5  Recall 2  Language- name 2 objects 2  Language- repeat 1  Language- follow 3 step command 3  Language- read & follow direction 1  Write a sentence 1  Copy design 1  Total score 29        Immunization History  Administered Date(s) Administered  . Influenza Whole 04/12/2008, 04/12/2010, 02/10/2011  . Influenza, Seasonal, Injecte, Preservative Fre 03/11/2013  . Influenza,inj,Quad PF,6+ Mos 02/12/2015, 05/04/2016, 03/29/2017  . Pneumococcal Conjugate-13 06/26/2013  . Pneumococcal Polysaccharide-23 07/21/2009, 06/27/2015  . Td 03/24/2009   Screening Tests Health Maintenance  Topic Date Due  . OPHTHALMOLOGY EXAM  02/14/1967  . HIV Screening  02/14/1972  . HEMOGLOBIN A1C  07/06/2016  . FOOT EXAM  03/29/2018  . TETANUS/TDAP  03/25/2019  . COLONOSCOPY  03/17/2022  . PNEUMOCOCCAL POLYSACCHARIDE VACCINE  Completed  . INFLUENZA VACCINE  Completed  . Hepatitis C Screening  Completed      Plan:  Continue doing brain stimulating activities (puzzles, reading, adult coloring books, staying active) to keep memory sharp.   Start to eat heart healthy diet (full of fruits, vegetables, whole grains, lean protein, water--limit salt, fat, and sugar intake) and increase physical activity as tolerated.  I have personally reviewed and noted the following in the patient's chart:   . Medical and social history . Use of alcohol, tobacco or illicit drugs  . Current medications and supplements . Functional ability and status . Nutritional status . Physical activity . Advanced directives . List of other physicians . Vitals . Screenings to include cognitive, depression, and falls . Referrals and appointments  In addition, I have reviewed and discussed with patient certain preventive  protocols, quality metrics, and best practice recommendations. A written personalized care plan for preventive services as well as general preventive health recommendations were provided to patient.     Michiel Cowboy, RN  03/29/2017  Medical screening examination/treatment/procedure(s) were performed by non-physician practitioner and as supervising physician I was immediately available for consultation/collaboration. I agree with above. Cathlean Cower, MD

## 2017-03-29 NOTE — Patient Instructions (Addendum)
Continue doing brain stimulating activities (puzzles, reading, adult coloring books, staying active) to keep memory sharp.   Start to eat heart healthy diet (full of fruits, vegetables, whole grains, lean protein, water--limit salt, fat, and sugar intake) and increase physical activity as tolerated.   [name] [name] , Thank you for taking time to come for your Medicare Wellness Visit. I appreciate your ongoing commitment to your health goals. Please review the following plan we discussed and let me know if I can assist you in the future.   These are the goals we discussed: Goals    . Reduce sodium intake          Case Manager discussed ways to reduce sodium intake with patient when cooking.  Suggestions:  Low sodium broth to cook vegetables.  Use Mrs. Dash to season and or marinate meats instead of seasoning salt and/or meat tenderizer.    . Start to eat healthy and exercise.          Start to read labels, do portion control, increase the amount of water I drink. Increase the amount I socialize       This is a list of the screening recommended for you and due dates:  Health Maintenance  Topic Date Due  . Eye exam for diabetics  02/14/1967  . HIV Screening  02/14/1972  . Complete foot exam   06/26/2014  . Hemoglobin A1C  07/06/2016  . Flu Shot  01/09/2017  . Tetanus Vaccine  03/25/2019  . Colon Cancer Screening  03/17/2022  . Pneumococcal vaccine  Completed  .  Hepatitis C: One time screening is recommended by Center for Disease Control  (CDC) for  adults born from [birth year] through 1964.   Completed   Influenza Virus Vaccine (Flucelvax) What is this medicine? INFLUENZA VIRUS VACCINE (in floo EN zuh VAHY ruhs vak SEEN) helps to reduce the risk of getting influenza also known as the flu. The vaccine only helps protect you against some strains of the flu. This medicine may be used for other purposes; ask your health care provider or pharmacist if you have questions. COMMON BRAND NAME(S):  FLUCELVAX What should I tell my health care provider before I take this medicine? They need to know if you have any of these conditions: -bleeding disorder like hemophilia -fever or infection -Guillain-Barre syndrome or other neurological problems -immune system problems -infection with the human immunodeficiency virus (HIV) or AIDS -low blood platelet counts -multiple sclerosis -an unusual or allergic reaction to influenza virus vaccine, other medicines, foods, dyes or preservatives -pregnant or trying to get pregnant -breast-feeding How should I use this medicine? This vaccine is for injection into a muscle. It is given by a health care professional. A copy of Vaccine Information Statements will be given before each vaccination. Read this sheet carefully each time. The sheet may change frequently. Talk to your pediatrician regarding the use of this medicine in children. Special care may be needed. Overdosage: If you think you've taken too much of this medicine contact a poison control center or emergency room at once. Overdosage: If you think you have taken too much of this medicine contact a poison control center or emergency room at once. NOTE: This medicine is only for you. Do not share this medicine with others. What if I miss a dose? This does not apply. What may interact with this medicine? -chemotherapy or radiation therapy -medicines that lower your immune system like etanercept, anakinra, infliximab, and adalimumab -medicines that treat or prevent  blood clots like warfarin -phenytoin -steroid medicines like prednisone or cortisone -theophylline -vaccines This list may not describe all possible interactions. Give your health care provider a list of all the medicines, herbs, non-prescription drugs, or dietary supplements you use. Also tell them if you smoke, drink alcohol, or use illegal drugs. Some items may interact with your medicine. What should I watch for while using  this medicine? Report any side effects that do not go away within 3 days to your doctor or health care professional. Call your health care provider if any unusual symptoms occur within 6 weeks of receiving this vaccine. You may still catch the flu, but the illness is not usually as bad. You cannot get the flu from the vaccine. The vaccine will not protect against colds or other illnesses that may cause fever. The vaccine is needed every year. What side effects may I notice from receiving this medicine? Side effects that you should report to your doctor or health care professional as soon as possible: -allergic reactions like skin rash, itching or hives, swelling of the face, lips, or tongue Side effects that usually do not require medical attention (Report these to your doctor or health care professional if they continue or are bothersome.): -fever -headache -muscle aches and pains -pain, tenderness, redness, or swelling at the injection site -tiredness This list may not describe all possible side effects. Call your doctor for medical advice about side effects. You may report side effects to FDA at 1-800-FDA-1088. Where should I keep my medicine? The vaccine will be given by a health care professional in a clinic, pharmacy, doctor's office, or other health care setting. You will not be given vaccine doses to store at home. NOTE: This sheet is a summary. It may not cover all possible information. If you have questions about this medicine, talk to your doctor, pharmacist, or health care provider.  2018 Elsevier/Gold Standard (2011-05-09 14:06:47)

## 2017-03-29 NOTE — Progress Notes (Signed)
Pre visit review using our clinic review tool, if applicable. No additional management support is needed unless otherwise documented below in the visit note. 

## 2017-03-29 NOTE — Telephone Encounter (Signed)
Ok to forward to shirron who handles the refills, thanks

## 2017-04-02 ENCOUNTER — Other Ambulatory Visit: Payer: Self-pay | Admitting: Physician Assistant

## 2017-04-03 ENCOUNTER — Other Ambulatory Visit: Payer: Self-pay | Admitting: Physician Assistant

## 2017-04-09 MED ORDER — ROSUVASTATIN CALCIUM 40 MG PO TABS: 40.0000 mg | ORAL_TABLET | Freq: Every day | ORAL | 2 refills | Status: DC

## 2017-04-09 NOTE — Addendum Note (Signed)
Addended by: Earnstine Regal on: 04/09/2017 09:52 AM   Modules accepted: Orders

## 2017-04-09 NOTE — Telephone Encounter (Signed)
Patient called stating that the pharmacy did receive the Nitroglycerin prescription but did not have the Rosuvastatin. Can this be called in again for him?

## 2017-04-09 NOTE — Telephone Encounter (Signed)
MD CMA missed and hit phone-in for the Rosuvastatin. Resent to pof...[name] [name]

## 2017-04-16 ENCOUNTER — Other Ambulatory Visit: Payer: Self-pay | Admitting: Physician Assistant

## 2017-04-16 NOTE — Telephone Encounter (Signed)
Please advise on refill request as patient has had multiple refills with notes to call and schedule an appointment but patient has failed to do so. Thanks, MI

## 2017-04-16 NOTE — Telephone Encounter (Signed)
I have lmtcb in regards to his refill for Protonix. Pt has not been seen since 12/27/15. Pt has cancelled f/u appts and lab appts. Pt needs an appt and needs to keep appt. Pt can also try getting Protonix from PCP.

## 2017-05-05 ENCOUNTER — Other Ambulatory Visit: Payer: Self-pay | Admitting: Physician Assistant

## 2017-05-05 DIAGNOSIS — I2510 Atherosclerotic heart disease of native coronary artery without angina pectoris: Secondary | ICD-10-CM

## 2017-05-09 ENCOUNTER — Other Ambulatory Visit: Payer: Self-pay | Admitting: Internal Medicine

## 2017-05-09 ENCOUNTER — Encounter: Payer: Self-pay | Admitting: Internal Medicine

## 2017-05-09 ENCOUNTER — Ambulatory Visit (INDEPENDENT_AMBULATORY_CARE_PROVIDER_SITE_OTHER): Payer: Medicare HMO | Admitting: Internal Medicine

## 2017-05-09 VITALS — BP 126/82 | HR 60 | Temp 97.7°F | Ht 72.0 in | Wt 300.0 lb

## 2017-05-09 DIAGNOSIS — E1159 Type 2 diabetes mellitus with other circulatory complications: Secondary | ICD-10-CM

## 2017-05-09 DIAGNOSIS — Z114 Encounter for screening for human immunodeficiency virus [HIV]: Secondary | ICD-10-CM | POA: Diagnosis not present

## 2017-05-09 DIAGNOSIS — I255 Ischemic cardiomyopathy: Secondary | ICD-10-CM

## 2017-05-09 DIAGNOSIS — I2510 Atherosclerotic heart disease of native coronary artery without angina pectoris: Secondary | ICD-10-CM

## 2017-05-09 DIAGNOSIS — Z0000 Encounter for general adult medical examination without abnormal findings: Secondary | ICD-10-CM | POA: Diagnosis not present

## 2017-05-09 MED ORDER — POTASSIUM CHLORIDE ER 10 MEQ PO TBCR: EXTENDED_RELEASE_TABLET | ORAL | 3 refills | Status: DC

## 2017-05-09 MED ORDER — CARVEDILOL 6.25 MG PO TABS: 6.2500 mg | ORAL_TABLET | Freq: Two times a day (BID) | ORAL | 3 refills | Status: DC

## 2017-05-09 MED ORDER — ESCITALOPRAM OXALATE 20 MG PO TABS: 20.0000 mg | ORAL_TABLET | Freq: Every day | ORAL | 3 refills | Status: DC

## 2017-05-09 MED ORDER — LOSARTAN POTASSIUM 100 MG PO TABS: ORAL_TABLET | ORAL | 3 refills | Status: DC

## 2017-05-09 MED ORDER — TRAMADOL HCL 50 MG PO TABS: 50.0000 mg | ORAL_TABLET | Freq: Four times a day (QID) | ORAL | 2 refills | Status: DC | PRN

## 2017-05-09 MED ORDER — GLIPIZIDE ER 2.5 MG PO TB24: 2.5000 mg | ORAL_TABLET | Freq: Every day | ORAL | 3 refills | Status: DC

## 2017-05-09 MED ORDER — TRIAMCINOLONE ACETONIDE 55 MCG/ACT NA AERO: 2.0000 | INHALATION_SPRAY | Freq: Every day | NASAL | 12 refills | Status: DC

## 2017-05-09 MED ORDER — FUROSEMIDE 40 MG PO TABS: ORAL_TABLET | ORAL | 1 refills | Status: DC

## 2017-05-09 MED ORDER — ROSUVASTATIN CALCIUM 40 MG PO TABS
40.0000 mg | ORAL_TABLET | Freq: Every day | ORAL | 3 refills | Status: DC
Start: 2017-05-09 — End: 2019-03-13

## 2017-05-09 MED ORDER — NITROGLYCERIN 0.4 MG SL SUBL: SUBLINGUAL_TABLET | SUBLINGUAL | 5 refills | Status: DC

## 2017-05-09 MED ORDER — ISOSORBIDE MONONITRATE ER 30 MG PO TB24: 90.0000 mg | ORAL_TABLET | Freq: Every day | ORAL | 3 refills | Status: DC

## 2017-05-09 MED ORDER — CLONIDINE HCL 0.3 MG PO TABS: 0.3000 mg | ORAL_TABLET | Freq: Two times a day (BID) | ORAL | 3 refills | Status: DC

## 2017-05-09 MED ORDER — PANTOPRAZOLE SODIUM 40 MG PO TBEC: 40.0000 mg | DELAYED_RELEASE_TABLET | Freq: Every day | ORAL | 0 refills | Status: DC

## 2017-05-09 MED ORDER — CLOPIDOGREL BISULFATE 75 MG PO TABS: 75.0000 mg | ORAL_TABLET | Freq: Every day | ORAL | 3 refills | Status: DC

## 2017-05-09 NOTE — Assessment & Plan Note (Signed)
Also for f/u with cardiology - dr [name]

## 2017-05-09 NOTE — Assessment & Plan Note (Signed)

## 2017-05-09 NOTE — Progress Notes (Signed)
Subjective:    Patient ID: [name] [name], male    DOB: 1955/08/05, 61 y.o.   MRN: [id number]  HPI  Here for wellness and f/u;  Overall doing ok;  Pt denies Chest pain, worsening SOB, DOE, wheezing, orthopnea, PND, worsening LE edema, palpitations, dizziness or syncope.  Pt denies neurological change such as new headache, facial or extremity weakness.  Pt denies polydipsia, polyuria, but has nearly daily low sugar symptoms, especially if not eating mid day.. Pt states overall good compliance with treatment and medications, good tolerability, and has been trying to follow appropriate diet.  Pt denies worsening depressive symptoms, suicidal ideation or panic. No fever, night sweats, wt loss, loss of appetite, or other constitutional symptoms.  Pt states good ability with ADL's, has low fall risk, home safety reviewed and adequate, no other significant changes in hearing or vision, and only occasionally active with exercise. No other interval hx  Has not seen cardiology in f/u due to cost Wt Readings from Last 3 Encounters:  05/09/17 300 lb (136.1 kg)  03/29/17 (!) 304 lb (137.9 kg)  12/18/16 (!) 305 lb (138.3 kg)   Past Medical History:  Diagnosis Date  . CAD (coronary artery disease)    a. s/p PCI/BMS pRCA 2002; b. PCI pRCA & DES p/m RCA 2006; c. PCI/DES OM4 2007; d. PCI/CBA to RCA for ISR 10/2006; e.08/2012 STEMI/Cath/PCI:  LAD 95% >> PCI: 3.0x20 Promus Prem DES  //  f. NSTEMI 10/15 >> LHC: mLAD stent ok, dLAD 70, OM1 CTO, OM2 stent ok, dOM2 90, OM3 30-40, dLCx 90, p-mRCA stent ok, mRCA stent ok w/ 60-70 ISR, EF 50% >> med Rx  . Chronic combined systolic and diastolic CHF (congestive heart failure) ([place])    a. Myoview 1/17: EF 35%  //  b. Echo 1/17 EF 45-50%  . Depression   . Diabetes mellitus ([place])    a. A1c 8.8 08/2012->Metformin initiated. => b. A1c (9/14): 6.6  . GERD (gastroesophageal reflux disease)   . History of nuclear stress test    a. Myoview 1/17: EF 35%, fixed inferior lateral defect  consistent with infarct, no ischemia, intermediate risk  . History of pneumonia   . HTN (hypertension)   . Hyperlipidemia   . Ischemic cardiomyopathy    a. EF 40%; improved to normal;  b. 08/2012 Echo: EF 50-55%, mod LVH.//  c. Echo 9/16: Inf HK, mild LVH, EF 55%, mild LAE, normal RVF, mild RAE, PASP 35 mmHg  //  d. Echo 1/17: EF 45-50%, inferior HK, mild BAE, PASP 33 mmHg  . NSTEMI (non-ST elevated myocardial infarction) ([place])   . Obesity   . OSA (obstructive sleep apnea)    Does not use CPAP as of 05/2011  . Tobacco abuse    Past Surgical History:  Procedure Laterality Date  . CORONARY ANGIOPLASTY WITH STENT PLACEMENT    . CORONARY STENT PLACEMENT  2009  . LEFT HEART CATHETERIZATION WITH CORONARY ANGIOGRAM N/A 08/31/2012   Procedure: LEFT HEART CATHETERIZATION WITH CORONARY ANGIOGRAM;  Surgeon: Burnell Blanks, MD;  Location: [facility] CATH LAB;  Service: Cardiovascular;  Laterality: N/A;  . LEFT HEART CATHETERIZATION WITH CORONARY ANGIOGRAM N/A 11/16/2013   Procedure: LEFT HEART CATHETERIZATION WITH CORONARY ANGIOGRAM;  Surgeon: Blane Ohara, MD;  Location: [facility] CATH LAB;  Service: Cardiovascular;  Laterality: N/A;  . LEFT HEART CATHETERIZATION WITH CORONARY ANGIOGRAM N/A 03/15/2014   Procedure: LEFT HEART CATHETERIZATION WITH CORONARY ANGIOGRAM;  Surgeon: Peter M Martinique, MD;  Location: [facility] CATH LAB;  Service: Cardiovascular;  Laterality: N/A;  . PERCUTANEOUS CORONARY STENT INTERVENTION (PCI-S) Right 08/31/2012   Procedure: PERCUTANEOUS CORONARY STENT INTERVENTION (PCI-S);  Surgeon: Burnell Blanks, MD;  Location: [facility] CATH LAB;  Service: Cardiovascular;  Laterality: Right;  . PERCUTANEOUS CORONARY STENT INTERVENTION (PCI-S)  11/16/2013   Procedure: PERCUTANEOUS CORONARY STENT INTERVENTION (PCI-S);  Surgeon: Blane Ohara, MD;  Location: [facility] CATH LAB;  Service: Cardiovascular;;    reports that he has been smoking cigarettes.  He has a 15.00 pack-year smoking history. he has never used  smokeless tobacco. He reports that he does not drink alcohol or use drugs. family history includes Cancer in his mother; Coronary artery disease in his unknown relative; Depression in his mother; Hypertension in his mother; Other in his father. Allergies  Allergen Reactions  . Penicillins Other (See Comments)    Unknown childhood reaction   Current Outpatient Medications on File Prior to Visit  Medication Sig Dispense Refill  . albuterol (PROVENTIL HFA;VENTOLIN HFA) 108 (90 Base) MCG/ACT inhaler Inhale 2 puffs into the lungs every 6 (six) hours as needed for wheezing or shortness of breath. 1 Inhaler 2  . aspirin 81 MG tablet Take 81 mg by mouth daily.    . cetirizine (ZYRTEC) 10 MG tablet Take 1 tablet (10 mg total) by mouth daily. 30 tablet 11  . gabapentin (NEURONTIN) 100 MG capsule TAKE 1 CAPSULE BY MOUTH THREE TIMES DAILY X 1 WEEK, THEN TAKE THE 300 MG DOSING 21 capsule 0  . gabapentin (NEURONTIN) 300 MG capsule TAKE 1 CAPSULE(300 MG) BY MOUTH three times daily 90 capsule 5  . lactulose (CHRONULAC) 10 GM/15ML solution TAKE 30 ML BY MOUTH TWICE DAILY AS NEEDED 6081 mL 1  . Multiple Vitamins-Minerals (MULTIVITAMIN WITH MINERALS) tablet Take 1 tablet by mouth daily. CENTRUM SILVER MENS  50 PLUS     No current facility-administered medications on file prior to visit.    Review of Systems Constitutional: Negative for other unusual diaphoresis, sweats, appetite or weight changes HENT: Negative for other worsening hearing loss, ear pain, facial swelling, mouth sores or neck stiffness.   Eyes: Negative for other worsening pain, redness or other visual disturbance.  Respiratory: Negative for other stridor or swelling Cardiovascular: Negative for other palpitations or other chest pain  Gastrointestinal: Negative for worsening diarrhea or loose stools, blood in stool, distention or other pain Genitourinary: Negative for hematuria, flank pain or other change in urine volume.  Musculoskeletal:  Negative for myalgias or other joint swelling.  Skin: Negative for other color change, or other wound or worsening drainage.  Neurological: Negative for other syncope or numbness. Hematological: Negative for other adenopathy or swelling Psychiatric/Behavioral: Negative for hallucinations, other worsening agitation, SI, self-injury, or new decreased concentration All other system neg per pt    Objective:   Physical Exam BP 126/82   Pulse 60   Temp 97.7 F (36.5 C) (Oral)   Ht 6' (1.829 m)   Wt 300 lb (136.1 kg)   SpO2 98%   BMI 40.69 kg/m  VS noted,  Constitutional: Pt is oriented to person, place, and time. Appears well-developed and well-nourished, in no significant distress and comfortable Head: Normocephalic and atraumatic  Eyes: Conjunctivae and EOM are normal. Pupils are equal, round, and reactive to light Right Ear: External ear normal without discharge Left Ear: External ear normal without discharge Nose: Nose without discharge or deformity Mouth/Throat: Oropharynx is without other ulcerations and moist  Neck: Normal range of motion. Neck supple. No JVD present. No  tracheal deviation present or significant neck LA or mass Cardiovascular: Normal rate, regular rhythm, normal heart sounds and intact distal pulses.   Pulmonary/Chest: WOB normal and breath sounds without rales or wheezing  Abdominal: Soft. Bowel sounds are normal. NT. No HSM  Musculoskeletal: Normal range of motion. Exhibits no edema Lymphadenopathy: Has no other cervical adenopathy.  Neurological: Pt is alert and oriented to person, place, and time. Pt has normal reflexes. No cranial nerve deficit. Motor grossly intact, Gait intact Skin: Skin is warm and dry. No rash noted or new ulcerations Psychiatric:  Has normal mood and affect. Behavior is normal without agitation No other exam findings Lab Results  Component Value Date   WBC 7.0 12/16/2015   HGB 12.7 (L) 12/16/2015   HCT 38.9 (L) 12/16/2015   PLT 196  12/16/2015   GLUCOSE 115 (H) 01/04/2016   CHOL 89 01/04/2016   TRIG 40.0 01/04/2016   HDL 38.30 (L) 01/04/2016   LDLCALC 43 01/04/2016   ALT 14 01/04/2016   AST 14 01/04/2016   NA 141 01/04/2016   K 3.7 01/04/2016   CL 104 01/04/2016   CREATININE 1.67 (H) 01/04/2016   BUN 16 01/04/2016   CO2 32 01/04/2016   TSH 1.31 06/27/2015   PSA 0.75 06/27/2015   INR 0.88 06/06/2015   HGBA1C 6.4 01/04/2016   MICROALBUR 33.2 (H) 06/27/2015       Assessment & Plan:

## 2017-05-09 NOTE — Assessment & Plan Note (Signed)
Mild overcontrolled, ok to change the glimeparide 1 mg to glipizide eR 2.5 qd and urged dietary compliance.  If has further low sugars, we may need to consider stopping.

## 2017-05-09 NOTE — Patient Instructions (Addendum)
You will be contacted regarding the referral for: Eye doctor ,and cardiology  OK to change the 1 mg glimeparide to the low dose glipizide ER 2.5 mg daily  Please call if you continue to have low sugars, as this will hurt your chances of further wt loss  Please continue all other medications as before, and refills have been done if requested.  Please have the pharmacy call with any other refills you may need.  Please continue your efforts at being more active, low cholesterol diet, and weight control.  You are otherwise up to date with prevention measures today.  Please keep your appointments with your specialists as you may have planned  Please go to the LAB in the Basement (turn left off the elevator) for the tests to be done today  You will be contacted by phone if any changes need to be made immediately.  Otherwise, you will receive a letter about your results with an explanation, but please check with MyChart first.  Please remember to sign up for MyChart if you have not done so, as this will be important to you in the future with finding out test results, communicating by private email, and scheduling acute appointments online when needed.  Please return in 6 months, or sooner if needed, with Lab testing done 3-5 days before

## 2017-05-10 ENCOUNTER — Ambulatory Visit: Payer: Medicare HMO | Admitting: Internal Medicine

## 2017-05-14 ENCOUNTER — Other Ambulatory Visit (INDEPENDENT_AMBULATORY_CARE_PROVIDER_SITE_OTHER): Payer: Medicare HMO

## 2017-05-14 DIAGNOSIS — E1159 Type 2 diabetes mellitus with other circulatory complications: Secondary | ICD-10-CM | POA: Diagnosis not present

## 2017-05-14 DIAGNOSIS — Z0000 Encounter for general adult medical examination without abnormal findings: Secondary | ICD-10-CM | POA: Diagnosis not present

## 2017-05-14 DIAGNOSIS — I255 Ischemic cardiomyopathy: Secondary | ICD-10-CM

## 2017-05-14 DIAGNOSIS — Z114 Encounter for screening for human immunodeficiency virus [HIV]: Secondary | ICD-10-CM | POA: Diagnosis not present

## 2017-05-14 LAB — BASIC METABOLIC PANEL
BUN: 15 mg/dL (ref 6–23)
CALCIUM: 9.5 mg/dL (ref 8.4–10.5)
CO2: 29 mEq/L (ref 19–32)
Chloride: 108 mEq/L (ref 96–112)
Creatinine, Ser: 1.34 mg/dL (ref 0.40–1.50)
GFR: 69.87 mL/min (ref >60.00)
GLUCOSE: 83 mg/dL (ref 70–99)
POTASSIUM: 3.7 meq/L (ref 3.5–5.1)
SODIUM: 144 meq/L (ref 135–145)

## 2017-05-14 LAB — CBC WITH DIFFERENTIAL/PLATELET
BASOS PCT: 0.7 % (ref 0.0–3.0)
Basophils Absolute: 0 10*3/uL (ref 0.0–0.1)
EOS PCT: 2.4 % (ref 0.0–5.0)
Eosinophils Absolute: 0.1 10*3/uL (ref 0.0–0.7)
HCT: 45.8 % (ref 39.0–52.0)
Hemoglobin: 14.8 g/dL (ref 13.0–17.0)
LYMPHS ABS: 3.2 10*3/uL (ref 0.7–4.0)
Lymphocytes Relative: 56.8 % — ABNORMAL HIGH (ref 12.0–46.0)
MCHC: 32.4 g/dL (ref 30.0–36.0)
MCV: 88.7 fl (ref 78.0–100.0)
MONO ABS: 0.6 10*3/uL (ref 0.1–1.0)
MONOS PCT: 11.6 % (ref 3.0–12.0)
NEUTROS ABS: 1.6 10*3/uL (ref 1.4–7.7)
NEUTROS PCT: 28.5 % — AB (ref 43.0–77.0)
PLATELETS: 183 10*3/uL (ref 150.0–400.0)
RBC: 5.16 Mil/uL (ref 4.22–5.81)
RDW: 15.9 % — AB (ref 11.5–15.5)
WBC: 5.6 10*3/uL (ref 4.0–10.5)

## 2017-05-14 LAB — URINALYSIS, ROUTINE W REFLEX MICROSCOPIC
BILIRUBIN URINE: NEGATIVE
Hgb urine dipstick: NEGATIVE
Ketones, ur: NEGATIVE
LEUKOCYTES UA: NEGATIVE
Nitrite: NEGATIVE
PH: 5.5 (ref 5.0–8.0)
RBC / HPF: NONE SEEN (ref 0–?)
Specific Gravity, Urine: >=1.03 — AB (ref 1.000–1.030)
Total Protein, Urine: 100 — AB
Urine Glucose: NEGATIVE
Urobilinogen, UA: 0.2 (ref 0.0–1.0)

## 2017-05-14 LAB — TSH: TSH: 1.67 u[IU]/mL (ref 0.35–4.50)

## 2017-05-14 LAB — PSA: PSA: 0.83 ng/mL (ref 0.10–4.00)

## 2017-05-14 LAB — LIPID PANEL
CHOLESTEROL: 112 mg/dL (ref 0–200)
HDL: 45.3 mg/dL (ref >39.00)
LDL CALC: 55 mg/dL (ref 0–99)
NONHDL: 67.06
Total CHOL/HDL Ratio: 2
Triglycerides: 61 mg/dL (ref 0.0–149.0)
VLDL: 12.2 mg/dL (ref 0.0–40.0)

## 2017-05-14 LAB — HEPATIC FUNCTION PANEL
ALK PHOS: 74 U/L (ref 39–117)
ALT: 12 U/L (ref 0–53)
AST: 15 U/L (ref 0–37)
Albumin: 4 g/dL (ref 3.5–5.2)
BILIRUBIN DIRECT: 0.1 mg/dL (ref 0.0–0.3)
Total Bilirubin: 0.4 mg/dL (ref 0.2–1.2)
Total Protein: 6.5 g/dL (ref 6.0–8.3)

## 2017-05-14 LAB — BRAIN NATRIURETIC PEPTIDE: Pro B Natriuretic peptide (BNP): 153 pg/mL — ABNORMAL HIGH (ref 0.0–100.0)

## 2017-05-14 LAB — MICROALBUMIN / CREATININE URINE RATIO
CREATININE, U: 194.7 mg/dL
MICROALB/CREAT RATIO: 25.2 mg/g (ref 0.0–30.0)
Microalb, Ur: 49 mg/dL — ABNORMAL HIGH (ref 0.0–1.9)

## 2017-05-14 LAB — HEMOGLOBIN A1C: HEMOGLOBIN A1C: 6.3 % (ref 4.6–6.5)

## 2017-05-15 LAB — HIV ANTIBODY (ROUTINE TESTING W REFLEX): HIV 1&2 Ab, 4th Generation: NONREACTIVE

## 2017-05-29 ENCOUNTER — Emergency Department (HOSPITAL_COMMUNITY): Payer: Medicare HMO

## 2017-05-29 ENCOUNTER — Encounter (HOSPITAL_COMMUNITY): Payer: Self-pay | Admitting: Emergency Medicine

## 2017-05-29 ENCOUNTER — Observation Stay (HOSPITAL_COMMUNITY)
Admission: EM | Admit: 2017-05-29 | Discharge: 2017-05-31 | Disposition: A | Discharge status: Home / Self Care | Payer: Medicare HMO | Attending: Internal Medicine | Admitting: Internal Medicine

## 2017-05-29 ENCOUNTER — Ambulatory Visit: Payer: Self-pay

## 2017-05-29 DIAGNOSIS — E876 Hypokalemia: Secondary | ICD-10-CM | POA: Diagnosis not present

## 2017-05-29 DIAGNOSIS — I2510 Atherosclerotic heart disease of native coronary artery without angina pectoris: Secondary | ICD-10-CM

## 2017-05-29 DIAGNOSIS — Z9861 Coronary angioplasty status: Secondary | ICD-10-CM | POA: Diagnosis not present

## 2017-05-29 DIAGNOSIS — N189 Chronic kidney disease, unspecified: Secondary | ICD-10-CM | POA: Diagnosis present

## 2017-05-29 DIAGNOSIS — R0602 Shortness of breath: Principal | ICD-10-CM | POA: Diagnosis present

## 2017-05-29 DIAGNOSIS — R0789 Other chest pain: Secondary | ICD-10-CM

## 2017-05-29 DIAGNOSIS — Z79899 Other long term (current) drug therapy: Secondary | ICD-10-CM | POA: Diagnosis not present

## 2017-05-29 DIAGNOSIS — Z87891 Personal history of nicotine dependence: Secondary | ICD-10-CM | POA: Diagnosis present

## 2017-05-29 DIAGNOSIS — N183 Chronic kidney disease, stage 3 unspecified: Secondary | ICD-10-CM | POA: Diagnosis present

## 2017-05-29 DIAGNOSIS — Z7902 Long term (current) use of antithrombotics/antiplatelets: Secondary | ICD-10-CM | POA: Diagnosis not present

## 2017-05-29 DIAGNOSIS — I10 Essential (primary) hypertension: Secondary | ICD-10-CM | POA: Diagnosis present

## 2017-05-29 DIAGNOSIS — R079 Chest pain, unspecified: Secondary | ICD-10-CM | POA: Diagnosis present

## 2017-05-29 DIAGNOSIS — G4733 Obstructive sleep apnea (adult) (pediatric): Secondary | ICD-10-CM | POA: Diagnosis present

## 2017-05-29 DIAGNOSIS — R0600 Dyspnea, unspecified: Secondary | ICD-10-CM | POA: Insufficient documentation

## 2017-05-29 DIAGNOSIS — E1122 Type 2 diabetes mellitus with diabetic chronic kidney disease: Secondary | ICD-10-CM | POA: Diagnosis not present

## 2017-05-29 DIAGNOSIS — I130 Hypertensive heart and chronic kidney disease with heart failure and stage 1 through stage 4 chronic kidney disease, or unspecified chronic kidney disease: Secondary | ICD-10-CM | POA: Diagnosis not present

## 2017-05-29 DIAGNOSIS — I5042 Chronic combined systolic (congestive) and diastolic (congestive) heart failure: Secondary | ICD-10-CM | POA: Diagnosis not present

## 2017-05-29 DIAGNOSIS — Z7982 Long term (current) use of aspirin: Secondary | ICD-10-CM | POA: Insufficient documentation

## 2017-05-29 DIAGNOSIS — N1832 Chronic kidney disease, stage 3b: Secondary | ICD-10-CM | POA: Diagnosis present

## 2017-05-29 DIAGNOSIS — Z7984 Long term (current) use of oral hypoglycemic drugs: Secondary | ICD-10-CM | POA: Insufficient documentation

## 2017-05-29 DIAGNOSIS — I252 Old myocardial infarction: Secondary | ICD-10-CM | POA: Diagnosis not present

## 2017-05-29 DIAGNOSIS — F17210 Nicotine dependence, cigarettes, uncomplicated: Secondary | ICD-10-CM | POA: Insufficient documentation

## 2017-05-29 DIAGNOSIS — Z720 Tobacco use: Secondary | ICD-10-CM | POA: Diagnosis present

## 2017-05-29 DIAGNOSIS — E119 Type 2 diabetes mellitus without complications: Secondary | ICD-10-CM

## 2017-05-29 DIAGNOSIS — E1159 Type 2 diabetes mellitus with other circulatory complications: Secondary | ICD-10-CM | POA: Diagnosis not present

## 2017-05-29 LAB — CBC
HCT: 46.2 % (ref 39.0–52.0)
HEMOGLOBIN: 15.4 g/dL (ref 13.0–17.0)
MCH: 29.1 pg (ref 26.0–34.0)
MCHC: 33.3 g/dL (ref 30.0–36.0)
MCV: 87.2 fL (ref 78.0–100.0)
Platelets: 194 10*3/uL (ref 150–400)
RBC: 5.3 MIL/uL (ref 4.22–5.81)
RDW: 15.8 % — AB (ref 11.5–15.5)
WBC: 6.4 10*3/uL (ref 4.0–10.5)

## 2017-05-29 LAB — I-STAT TROPONIN, ED
TROPONIN I, POC: 0.05 ng/mL (ref 0.00–0.08)
Troponin i, poc: 0.04 ng/mL (ref 0.00–0.08)

## 2017-05-29 LAB — BASIC METABOLIC PANEL
ANION GAP: 8 (ref 5–15)
BUN: 18 mg/dL (ref 6–20)
CALCIUM: 9.3 mg/dL (ref 8.9–10.3)
CO2: 21 mmol/L — AB (ref 22–32)
CREATININE: 1.49 mg/dL — AB (ref 0.61–1.24)
Chloride: 107 mmol/L (ref 101–111)
GFR calc Af Amer: 57 mL/min — ABNORMAL LOW (ref >60)
GFR, EST NON AFRICAN AMERICAN: 49 mL/min — AB (ref >60)
GLUCOSE: 148 mg/dL — AB (ref 65–99)
Potassium: 3.3 mmol/L — ABNORMAL LOW (ref 3.5–5.1)
Sodium: 136 mmol/L (ref 135–145)

## 2017-05-29 LAB — PROTIME-INR
INR: 1
PROTHROMBIN TIME: 13.1 s (ref 11.4–15.2)

## 2017-05-29 LAB — D-DIMER, QUANTITATIVE (NOT AT ARMC): D DIMER QUANT: 0.71 ug{FEU}/mL — AB (ref 0.00–0.50)

## 2017-05-29 LAB — APTT: aPTT: 30 seconds (ref 24–36)

## 2017-05-29 LAB — BRAIN NATRIURETIC PEPTIDE: B NATRIURETIC PEPTIDE 5: 75.2 pg/mL (ref 0.0–100.0)

## 2017-05-29 MED ORDER — IOPAMIDOL (ISOVUE-370) INJECTION 76%
INTRAVENOUS | Status: AC
  Administered 2017-05-29: 100 mL
  Filled 2017-05-29: qty 100

## 2017-05-29 MED ORDER — ASPIRIN 81 MG PO CHEW
324.0000 mg | CHEWABLE_TABLET | Freq: Once | ORAL | Status: AC
  Administered 2017-05-29: 324 mg via ORAL
  Filled 2017-05-29: qty 4

## 2017-05-29 NOTE — ED Notes (Signed)
Pt sat 99% on room air. Requested to be put back on [place] @ 2LPM.

## 2017-05-29 NOTE — ED Triage Notes (Signed)
Per EMS, pt from home with c/o shortness of breath and chest pain x 2 days. Hx COPD/CHF. EMS vitals: BP-140/90, HR-54, SpO2-98% room air.

## 2017-05-29 NOTE — ED Notes (Signed)
Patient transported to CT 

## 2017-05-29 NOTE — ED Provider Notes (Signed)
[facility] EMERGENCY DEPARTMENT Provider Note   CSN: [id number] Arrival date & time: 05/29/17  1508     History   Chief Complaint Chief Complaint  Patient presents with  . Shortness of Breath  . Chest Pain    HPI Newell Wafer is a 61 y.o. male.  HPI   Ahamed Hofland is a 61 y.o. male, with a history of CAD, MI, CHF, DM, HTN, hyperlipidemia, and GERD, presenting to the ED with shortness of breath over the past two days.  Had an episode of chest pressure while at rest today, right chest, radiating to right axilla, lasted for about 3 minutes, resolved and has intermittently recurred "in fleeting moments." Currently complaining of left chest pressure, 2/10, nonradiating. Then today also began to have increased shortness of breath and cough with lying flat.  States, "Now it feels like I am breathing through a straw." Does not feel like any of his previous MIs. Diarrhea started this morning.  Patient was put on 2L O2 with EMS, SPO2 98% on RA, SOB improved with it on, worsened when RN attempted to discontinue it, patient requested it be put back on.  Denies N/V, fever, peripheral edema, or any other complaints.      Past Medical History:  Diagnosis Date  . CAD (coronary artery disease)    a. s/p PCI/BMS pRCA 2002; b. PCI pRCA & DES p/m RCA 2006; c. PCI/DES OM4 2007; d. PCI/CBA to RCA for ISR 10/2006; e.08/2012 STEMI/Cath/PCI:  LAD 95% >> PCI: 3.0x20 Promus Prem DES  //  f. NSTEMI 10/15 >> LHC: mLAD stent ok, dLAD 70, OM1 CTO, OM2 stent ok, dOM2 90, OM3 30-40, dLCx 90, p-mRCA stent ok, mRCA stent ok w/ 60-70 ISR, EF 50% >> med Rx  . Chronic combined systolic and diastolic CHF (congestive heart failure) ([place])    a. Myoview 1/17: EF 35%  //  b. Echo 1/17 EF 45-50%  . Depression   . Diabetes mellitus ([place])    a. A1c 8.8 08/2012->Metformin initiated. => b. A1c (9/14): 6.6  . GERD (gastroesophageal reflux disease)   . History of nuclear stress test    a. Myoview  1/17: EF 35%, fixed inferior lateral defect consistent with infarct, no ischemia, intermediate risk  . History of pneumonia   . HTN (hypertension)   . Hyperlipidemia   . Ischemic cardiomyopathy    a. EF 40%; improved to normal;  b. 08/2012 Echo: EF 50-55%, mod LVH.//  c. Echo 9/16: Inf HK, mild LVH, EF 55%, mild LAE, normal RVF, mild RAE, PASP 35 mmHg  //  d. Echo 1/17: EF 45-50%, inferior HK, mild BAE, PASP 33 mmHg  . NSTEMI (non-ST elevated myocardial infarction) ([place])   . Obesity   . OSA (obstructive sleep apnea)    Does not use CPAP as of 05/2011  . Tobacco abuse     Patient Active Problem List   Diagnosis Date Noted  . Allergic rhinitis 12/18/2016  . Wheezing 12/18/2016  . Lumbar radiculitis 09/05/2016  . Lumbar spinal stenosis 09/05/2016  . CKD (chronic kidney disease), stage III ([place]) 12/29/2015  . Constipation 12/29/2015  . Chest pain syndrome 12/15/2015  . Acute kidney injury ([place]) 12/15/2015  . Atypical chest pain 12/15/2015  . Preventative health care 06/27/2015  . Chronic low back pain 06/27/2015  . Left lumbar radiculopathy 04/13/2015  . Bilateral plantar fasciitis 04/13/2015  . Benign essential HTN 03/09/2015  . OSA (obstructive sleep apnea) 03/27/2014  . Peripheral neuropathy ([place]) 12/29/2013  .  External hemorrhoid, thrombosed 12/03/2013  . Sinus bradycardia 11/15/2013  . NSTEMI (non-ST elevated myocardial infarction) ([place]) 11/14/2013  . Chronic combined systolic and diastolic CHF  31/54/0086  . CAD S/P multiple PCI   . Hyperlipidemia   . DM2 (diabetes mellitus, type 2) ([place]) 06/26/2013  . Gait disorder 06/26/2013  . Obesity, Class III, BMI 40-49.9 (morbid obesity) ([place]) 09/10/2012  . Tobacco abuse 09/02/2012  . Ischemic cardiomyopathy- EF 50-55% June 2015   . ST elevation myocardial infarction (STEMI) of anterior wall, initial episode of care ([place]) 09/01/2012  . HEMORRHOIDS 12/26/2009  . Acute prostatitis 12/26/2009  . TINNITUS 11/28/2009  . GANGLION CYST,  WRIST, RIGHT 11/28/2009  . COLONIC POLYPS 10/07/2009  . DEPRESSION 07/21/2009  . TINEA VERSICOLOR 08/30/2008  . GASTROESOPHAGEAL REFLUX DISEASE 01/27/2007  . PERCUTANEOUS TRANSLUMINAL CORONARY ANGIOPLASTY, HX OF 02/09/2006    Past Surgical History:  Procedure Laterality Date  . CORONARY ANGIOPLASTY WITH STENT PLACEMENT    . CORONARY STENT PLACEMENT  2009  . LEFT HEART CATHETERIZATION WITH CORONARY ANGIOGRAM N/A 08/31/2012   Procedure: LEFT HEART CATHETERIZATION WITH CORONARY ANGIOGRAM;  Surgeon: Burnell Blanks, MD;  Location: [facility] CATH LAB;  Service: Cardiovascular;  Laterality: N/A;  . LEFT HEART CATHETERIZATION WITH CORONARY ANGIOGRAM N/A 11/16/2013   Procedure: LEFT HEART CATHETERIZATION WITH CORONARY ANGIOGRAM;  Surgeon: Blane Ohara, MD;  Location: [facility] CATH LAB;  Service: Cardiovascular;  Laterality: N/A;  . LEFT HEART CATHETERIZATION WITH CORONARY ANGIOGRAM N/A 03/15/2014   Procedure: LEFT HEART CATHETERIZATION WITH CORONARY ANGIOGRAM;  Surgeon: Peter M Martinique, MD;  Location: [facility] CATH LAB;  Service: Cardiovascular;  Laterality: N/A;  . PERCUTANEOUS CORONARY STENT INTERVENTION (PCI-S) Right 08/31/2012   Procedure: PERCUTANEOUS CORONARY STENT INTERVENTION (PCI-S);  Surgeon: Burnell Blanks, MD;  Location: [facility] CATH LAB;  Service: Cardiovascular;  Laterality: Right;  . PERCUTANEOUS CORONARY STENT INTERVENTION (PCI-S)  11/16/2013   Procedure: PERCUTANEOUS CORONARY STENT INTERVENTION (PCI-S);  Surgeon: Blane Ohara, MD;  Location: [facility] CATH LAB;  Service: Cardiovascular;;       Home Medications    Prior to Admission medications   Medication Sig Start Date End Date Taking? Authorizing Provider  albuterol (PROVENTIL HFA;VENTOLIN HFA) 108 (90 Base) MCG/ACT inhaler Inhale 2 puffs into the lungs every 6 (six) hours as needed for wheezing or shortness of breath. 12/18/16  Yes Biagio Borg, MD  aspirin 81 MG tablet Take 81 mg by mouth daily.   Yes [provider]    carvedilol (COREG) 6.25 MG tablet Take 1 tablet (6.25 mg total) by mouth 2 (two) times daily with a meal. *Please call and schedule an appointment* 05/09/17  Yes Biagio Borg, MD  cetirizine (ZYRTEC) 10 MG tablet Take 1 tablet (10 mg total) by mouth daily. 12/18/16 12/18/17 Yes Biagio Borg, MD  cloNIDine (CATAPRES) 0.3 MG tablet Take 1 tablet (0.3 mg total) by mouth 2 (two) times daily. 05/09/17  Yes Biagio Borg, MD  clopidogrel (PLAVIX) 75 MG tablet Take 1 tablet (75 mg total) by mouth daily. *Please call and schedule an appointment* 05/09/17  Yes Biagio Borg, MD  escitalopram (LEXAPRO) 20 MG tablet Take 1 tablet (20 mg total) by mouth daily. 05/09/17  Yes Biagio Borg, MD  furosemide (LASIX) 40 MG tablet SEE NOTES Patient taking differently: Take 40-80 mg by mouth 2 (two) times daily. Take 80 mg in the morning , take 40 mg in the evening  Take an extra 40 mg tablet as needed for weight for gain  3 lbs or more 05/09/17  Yes Biagio Borg, MD  gabapentin (NEURONTIN) 300 MG capsule TAKE 1 CAPSULE(300 MG) BY MOUTH three times daily 09/05/16  Yes Biagio Borg, MD  glipiZIDE (GLUCOTROL XL) 2.5 MG 24 hr tablet Take 1 tablet (2.5 mg total) by mouth daily with breakfast. 05/09/17  Yes Biagio Borg, MD  isosorbide mononitrate (IMDUR) 30 MG 24 hr tablet Take 3 tablets (90 mg total) by mouth daily. 05/09/17  Yes Biagio Borg, MD  lactulose [facility]) 10 GM/15ML solution TAKE 30 ML BY MOUTH TWICE DAILY AS NEEDED 12/29/15  Yes Biagio Borg, MD  losartan (COZAAR) 100 MG tablet TAKE 1 TABLET(100 MG) BY MOUTH DAILY 05/09/17  Yes Biagio Borg, MD  Multiple Vitamins-Minerals (MULTIVITAMIN WITH MINERALS) tablet Take 1 tablet by mouth daily. CENTRUM SILVER MENS  50 PLUS   Yes [provider]  nitroGLYCERIN (NITROSTAT) 0.4 MG SL tablet PLACE 1 TABLET UNDER THE TONGUE EVERY 5 MINUTES AS NEEDED FOR CHEST PAIN 05/09/17  Yes Biagio Borg, MD  pantoprazole (PROTONIX) 40 MG tablet TAKE 1 TABLET(40 MG) BY  MOUTH DAILY 05/09/17  Yes Biagio Borg, MD  potassium chloride (K-DUR) 10 MEQ tablet TAKE 2 TABLETS BY MOUTH EVERY MORNING AND 1 TABLET BY MOUTH EVERY EVENING Patient taking differently: 20 mEq 2 (two) times daily. TAKE 2 TABLETS BY MOUTH EVERY MORNING AND 1 TABLET BY MOUTH EVERY EVENING 05/09/17  Yes Biagio Borg, MD  rosuvastatin (CRESTOR) 40 MG tablet Take 1 tablet (40 mg total) by mouth daily. 05/09/17  Yes Biagio Borg, MD  traMADol (ULTRAM) 50 MG tablet Take 1 tablet (50 mg total) by mouth every 6 (six) hours as needed. 05/09/17  Yes Biagio Borg, MD  triamcinolone (NASACORT) 55 MCG/ACT AERO nasal inhaler Place 2 sprays into the nose daily. Patient not taking: Reported on 05/29/2017 05/09/17   Biagio Borg, MD    Family History Family History  Problem Relation Age of Onset  . Depression Mother   . Cancer Mother        ovarian  . Hypertension Mother        Died, 66  . Other Father        motor vehicle accident  . Coronary artery disease Unknown   . Heart attack Neg Hx   . Stroke Neg Hx     Social History Social History   Tobacco Use  . Smoking status: Current Every Day Smoker    Packs/day: 0.50    Years: 30.00    Pack years: 15.00    Types: Cigarettes  . Smokeless tobacco: Never Used  . Tobacco comment: trying to cut down  Substance Use Topics  . Alcohol use: No  . Drug use: No    Comment: remote marijuana use     Allergies   Penicillins   Review of Systems Review of Systems  Constitutional: Negative for chills, diaphoresis and fever.  Respiratory: Positive for cough and shortness of breath.   Cardiovascular: Positive for chest pain. Negative for leg swelling.  Gastrointestinal: Positive for diarrhea. Negative for nausea and vomiting.  Neurological: Negative for dizziness and light-headedness.  All other systems reviewed and are negative.    Physical Exam Updated Vital Signs BP 138/89 (BP Location: Right Arm)   Pulse 60   Temp 98 F (36.7 C)  (Oral)   Resp 16   SpO2 99%   Physical Exam  Constitutional: He appears well-developed and well-nourished. No distress.  HENT:  Head: Normocephalic and atraumatic.  Eyes: Conjunctivae are normal.  Neck: Neck supple.  Cardiovascular: Normal rate, regular rhythm, normal heart sounds and intact distal pulses.  Pulmonary/Chest: Effort normal and breath sounds normal. No respiratory distress. He exhibits no tenderness.  Abdominal: Soft. There is no tenderness. There is no guarding.  Musculoskeletal: He exhibits no edema.  Lymphadenopathy:    He has no cervical adenopathy.  Neurological: He is alert.  Skin: Skin is warm and dry. He is not diaphoretic.  Psychiatric: He has a normal mood and affect. His behavior is normal.  Nursing note and vitals reviewed.    ED Treatments / Results  Labs (all labs ordered are listed, but only abnormal results are displayed) Labs Reviewed  BASIC METABOLIC PANEL - Abnormal; Notable for the following components:      Result Value   Potassium 3.3 (*)    CO2 21 (*)    Glucose, Bld 148 (*)    Creatinine, Ser 1.49 (*)    GFR calc non Af Amer 49 (*)    GFR calc Af Amer 57 (*)    All other components within normal limits  CBC - Abnormal; Notable for the following components:   RDW 15.8 (*)    All other components within normal limits  D-DIMER, QUANTITATIVE (NOT AT [facility]) - Abnormal; Notable for the following components:   D-Dimer, Quant 0.71 (*)    All other components within normal limits  PROTIME-INR  APTT  BRAIN NATRIURETIC PEPTIDE  I-STAT TROPONIN, ED  I-STAT TROPONIN, ED    EKG  EKG Interpretation  Date/Time:  Wednesday May 29 2017 15:16:52 EST Ventricular Rate:  74 PR Interval:  134 QRS Duration: 186 QT Interval:  474 QTC Calculation: 526 R Axis:   -93 Text Interpretation:  Sinus rhythm with sinus arrhythmia with frequent and consecutive Premature ventricular complexes Right bundle branch block Moderate voltage criteria for LVH,  may be normal variant Lateral infarct , age undetermined Inferior infarct , age undetermined Abnormal ECG Premature ventricular complexes Nonspecific ST and T wave abnormality Confirmed by Varney Biles [id number]) on 05/29/2017 3:21:01 PM       EKG Interpretation  Date/Time:  Wednesday May 29 2017 19:49:48 EST Ventricular Rate:  48 PR Interval:  134 QRS Duration: 184 QT Interval:  670 QTC Calculation: 599 R Axis:   -56 Text Interpretation:  Sinus bradycardia Right bundle branch block LVH by voltage When compared to prior, greater amplitude but similar morphology.  No STEMI Confirmed by Antony Blackbird [phone]) on 05/29/2017 7:57:04 PM       Radiology Dg Chest 2 View  Result Date: 05/29/2017 CLINICAL DATA:  Chest pain shortness of breath. EXAM: CHEST  2 VIEW COMPARISON:  12/15/2015 FINDINGS: The lungs are clear without focal pneumonia, edema, pneumothorax or pleural effusion. Linear chronic atelectasis or scarring at the right base is stable. Cardiopericardial silhouette is at upper limits of normal for size. The visualized bony structures of the thorax are intact. IMPRESSION: Stable.  No acute findings. Electronically Signed   By: Misty Stanley M.D.   On: 05/29/2017 15:59   Ct Angio Chest Pe W And/or Wo Contrast  Result Date: 05/29/2017 CLINICAL DATA:  Shortness of breath and chest pain EXAM: CT ANGIOGRAPHY CHEST WITH CONTRAST TECHNIQUE: Multidetector CT imaging of the chest was performed using the standard protocol during bolus administration of intravenous contrast. Multiplanar CT image reconstructions and MIPs were obtained to evaluate the vascular anatomy. CONTRAST:  159mL ISOVUE-370 IOPAMIDOL (ISOVUE-370) INJECTION 76% COMPARISON:  Chest  radiograph May 29, 2017 FINDINGS: Cardiovascular: There is no demonstrable pulmonary embolus. There is no appreciable thoracic aortic aneurysm. No dissection seen, although the contrast bolus is less than optimal for dissection assessment.  Visualized great vessels appear unremarkable. There are foci of thoracic aortic calcification. There are foci of coronary artery calcification. There is no appreciable pericardial effusion or thickening. Mediastinum/Nodes: Visualized thyroid appears normal. There is a right hilar lymph node measuring 1.4 x 1.1 cm. There is no other appreciable lymph node prominence. There is a small hiatal hernia. Lungs/Pleura: There is patchy bilateral lower lobe atelectatic change. There is also atelectatic change in the posterior aspect of the right upper lobe inferiorly. There is no frank edema or consolidation. There is no appreciable pleural effusion or pleural thickening. Upper Abdomen: There is reflux of contrast into the inferior vena cava and hepatic veins. Visualized upper abdominal structures otherwise appear unremarkable. Musculoskeletal: There are foci of degenerative change in the lower thoracic region. There are no blastic or lytic bone lesions. Review of the MIP images confirms the above findings. IMPRESSION: 1.  No demonstrable pulmonary embolus. 2. Areas of atelectatic change bilaterally. No frank edema or consolidation. 3. Single mildly enlarged right hilar lymph node. Suspect reactive etiology. 4. Aortic atherosclerosis. Foci of coronary artery calcification noted. 5. Reflux of contrast into the inferior vena cava and hepatic veins may be indicative of increased right heart pressure. Aortic Atherosclerosis (ICD10-I70.0). Electronically Signed   By: Lowella Grip III M.D.   On: 05/29/2017 21:58    Procedures Procedures (including critical care time)  Medications Ordered in ED Medications  iopamidol (ISOVUE-370) 76 % injection (100 mLs  Contrast Given 05/29/17 2133)  aspirin chewable tablet 324 mg (324 mg Oral Given 05/29/17 2223)     Initial Impression / Assessment and Plan / ED Course  I have reviewed the triage vital signs and the nursing notes.  Pertinent labs & imaging results that were  available during my care of the patient were reviewed by me and considered in my medical decision making (see chart for details).  Clinical Course as of May 29 2257  Wed May 29, 2017  2256 Spoke with Dr. Tamala Julian, hospitalist.  Agrees to admit the patient.  [SJ]    Clinical Course User Index [SJ] Hallelujah Wysong C, PA-C    Patient presents with shortness of breath and chest pressure.  Working diagnosis of PE versus ACS.  Patient made more comfortable with supplemental O2.  No PE noted on CT. HEART score of 5.  Admission for cardiac rule out.  Findings and plan of care discussed with Antony Blackbird, MD. Dr. Sherry Ruffing personally evaluated and examined this patient.   Vitals:   05/29/17 2030 05/29/17 2100 05/29/17 2200 05/29/17 2215  BP: 127/85 126/86 (!) 150/89 (!) 149/78  Pulse:   (!) 49   Resp: 17 (!) 22 16 13   Temp:      TempSrc:      SpO2: 98% 97% 98% 100%   Vitals:   05/29/17 2100 05/29/17 2200 05/29/17 2215 05/29/17 2230  BP: 126/86 (!) 150/89 (!) 149/78 130/79  Pulse:  (!) 49    Resp: (!) 22 16 13    Temp:      TempSrc:      SpO2: 97% 98% 100% 95%     Final Clinical Impressions(s) / ED Diagnoses   Final diagnoses:  Shortness of breath  Chest pressure    ED Discharge Orders    None  Lorayne Bender, PA-C 05/29/17 2258    Tegeler, Gwenyth Allegra, MD 05/30/17 [phone]

## 2017-05-29 NOTE — Telephone Encounter (Signed)
Pt. called with onset of shortness of breath yesterday.  Reported he feels short of breath at rest, and worsens with activity.  Rated 6/10 @ rest, and 7-8/10 with activity.  Has hx of CHF.  Stated he was weighing himself daily, but has backed-off on checking it regularly.  Denied swelling of feet/ LE's.  Stated he has increased his sodium intake recently, by eating a lot of cookies.  Questioned if he has any upper resp. Symptoms?  Stated he has had  2 day hx of dry cough and nasal congestion; denied fever or sore throat.  Had pt. check pulse rate; 68 bpm.  Per protocol for SOB, advised pt. to go to urgent care.   Pt. verb. Understanding; agrees with plan.   Reason for Disposition . [1] MILD difficulty breathing (e.g., minimal/no SOB at rest, SOB with walking, pulse <100) AND [2] NEW-onset or WORSE than normal  Answer Assessment - Initial Assessment Questions 1. RESPIRATORY STATUS: "Describe your breathing?" (e.g., wheezing, shortness of breath, unable to speak, severe coughing)      A little short of breath with rest and increases with walking 2. ONSET: "When did this breathing problem begin?"      yesterday 3. PATTERN "Does the difficult breathing come and go, or has it been constant since it started?"      Comes and goes 4. SEVERITY: "How bad is your breathing?" (e.g., mild, moderate, severe)    - MILD: No SOB at rest, mild SOB with walking, speaks normally in sentences, can lay down, no retractions, pulse < 100.    - MODERATE: SOB at rest, SOB with minimal exertion and prefers to sit, cannot lie down flat, speaks in phrases, mild retractions, audible wheezing, pulse 100-120.    - SEVERE: Very SOB at rest, speaks in single words, struggling to breathe, sitting hunched forward, retractions, pulse > 120      At rest 6/10; with activity 7-8/10 5. RECURRENT SYMPTOM: "Have you had difficulty breathing before?" If so, ask: "When was the last time?" and "What happened that time?"      Has hx of CHF; its  been awhile since SOB; almost a year ago.  6. CARDIAC HISTORY: "Do you have any history of heart disease?" (e.g., heart attack, angina, bypass surgery, angioplasty)      Hx. Of CHF, Heart attack 7. LUNG HISTORY: "Do you have any history of lung disease?"  (e.g., pulmonary embolus, asthma, emphysema)     No PE; hx of Asthma  8. CAUSE: "What do you think is causing the breathing problem?"      Unknown 9. OTHER SYMPTOMS: "Do you have any other symptoms? (e.g., dizziness, runny nose, cough, chest pain, fever)    Denies chest pain or dizziness; nasal congestion, cough started 2 days ago;  10. PREGNANCY: "Is there any chance you are pregnant?" "When was your last menstrual period?"       N/a 11. TRAVEL: "Have you traveled out of the country in the last month?" (e.g., travel history, exposures)       no  Protocols used: BREATHING DIFFICULTY-A-AH

## 2017-05-29 NOTE — H&P (Signed)
History and Physical    [name] [name] DDU:[id number] DOB: 1955/09/08 DOA: 05/29/2017  Referring MD/NP/PA: Arlean Hopping, PA-C PCP: Biagio Borg, MD  Patient coming from: Home via EMS  Chief Complaint: Shortness of breath and chest pressure  I have personally briefly reviewed patient's old medical records in [place]   HPI: [name] [name] is a 61 y.o. male with medical history significant of CAD with multiple prior stents, ischemic cardiomyopathy, systolic CHF last EF 23-76% in 06/2015, HTN, HLD, GERD morbid obesity, and OSA with intermittent use of CPAP; who presents with complaints of shortness of breath over the last 2 days and centralized chest pressure today.  Reported feeling as though he was breathing through a straw.  At baseline the patient is not on oxygen.  Laying flat seems to make symptoms worse and made him have a dry cough.  The chest pain did not radiate.  He initially thought symptoms were secondary to fluid.  He does not regularly check his weight, but denies having any lower extremity swelling as he is previously had in the past.  Patient utilizes albuterol inhaler with only mild temporary relief.   He reports similar chest pressure symptoms with previous MI.  Reports last having a cardiac catheterization by Dr. [name] some years ago.  En route with EMS patient received 324 mg of aspirin.  Patient denied having any symptoms of nausea, vomiting, diaphoresis, or lightheadedness.  ED Course: Upon admission into the emergency department patient was noted to afebrile, pulse anywhere from 46-64, respirations 13-22, blood pressure 122/80 150/89, O2 saturations 95-100% on room air.  Patient was never noted to be hypoxic, but reported improvement of breathing on being placed on 2 L nasal cannula oxygen.  Patient notes that chest pain symptoms completely resolved following utilizing the restroom.  Review of Systems  Constitutional: Negative for chills and fever.  HENT: Negative for  ear pain and tinnitus.   Eyes: Negative for photophobia and pain.  Respiratory: Positive for cough and shortness of breath.   Cardiovascular: Positive for chest pain and orthopnea. Negative for leg swelling.  Gastrointestinal: Negative for abdominal pain, nausea and vomiting.  Genitourinary: Negative for dysuria and frequency.  Musculoskeletal: Negative for falls and neck pain.  Skin: Negative for itching and rash.  Neurological: Negative for dizziness and focal weakness.  Psychiatric/Behavioral: Negative for memory loss and substance abuse.    Past Medical History:  Diagnosis Date  . CAD (coronary artery disease)    a. s/p PCI/BMS pRCA 2002; b. PCI pRCA & DES p/m RCA 2006; c. PCI/DES OM4 2007; d. PCI/CBA to RCA for ISR 10/2006; e.08/2012 STEMI/Cath/PCI:  LAD 95% >> PCI: 3.0x20 Promus Prem DES  //  f. NSTEMI 10/15 >> LHC: mLAD stent ok, dLAD 70, OM1 CTO, OM2 stent ok, dOM2 90, OM3 30-40, dLCx 90, p-mRCA stent ok, mRCA stent ok w/ 60-70 ISR, EF 50% >> med Rx  . Chronic combined systolic and diastolic CHF (congestive heart failure) ([place])    a. Myoview 1/17: EF 35%  //  b. Echo 1/17 EF 45-50%  . Depression   . Diabetes mellitus ([place])    a. A1c 8.8 08/2012->Metformin initiated. => b. A1c (9/14): 6.6  . GERD (gastroesophageal reflux disease)   . History of nuclear stress test    a. Myoview 1/17: EF 35%, fixed inferior lateral defect consistent with infarct, no ischemia, intermediate risk  . History of pneumonia   . HTN (hypertension)   . Hyperlipidemia   . Ischemic cardiomyopathy  a. EF 40%; improved to normal;  b. 08/2012 Echo: EF 50-55%, mod LVH.//  c. Echo 9/16: Inf HK, mild LVH, EF 55%, mild LAE, normal RVF, mild RAE, PASP 35 mmHg  //  d. Echo 1/17: EF 45-50%, inferior HK, mild BAE, PASP 33 mmHg  . NSTEMI (non-ST elevated myocardial infarction) ([place])   . Obesity   . OSA (obstructive sleep apnea)    Does not use CPAP as of 05/2011  . Tobacco abuse     Past Surgical History:    Procedure Laterality Date  . CORONARY ANGIOPLASTY WITH STENT PLACEMENT    . CORONARY STENT PLACEMENT  2009  . LEFT HEART CATHETERIZATION WITH CORONARY ANGIOGRAM N/A 08/31/2012   Procedure: LEFT HEART CATHETERIZATION WITH CORONARY ANGIOGRAM;  Surgeon: Burnell Blanks, MD;  Location: [facility] CATH LAB;  Service: Cardiovascular;  Laterality: N/A;  . LEFT HEART CATHETERIZATION WITH CORONARY ANGIOGRAM N/A 11/16/2013   Procedure: LEFT HEART CATHETERIZATION WITH CORONARY ANGIOGRAM;  Surgeon: Blane Ohara, MD;  Location: [facility] CATH LAB;  Service: Cardiovascular;  Laterality: N/A;  . LEFT HEART CATHETERIZATION WITH CORONARY ANGIOGRAM N/A 03/15/2014   Procedure: LEFT HEART CATHETERIZATION WITH CORONARY ANGIOGRAM;  Surgeon: Peter M Martinique, MD;  Location: [facility] CATH LAB;  Service: Cardiovascular;  Laterality: N/A;  . PERCUTANEOUS CORONARY STENT INTERVENTION (PCI-S) Right 08/31/2012   Procedure: PERCUTANEOUS CORONARY STENT INTERVENTION (PCI-S);  Surgeon: Burnell Blanks, MD;  Location[facility] CATH LAB;  Service: Cardiovascular;  Laterality: Right;  . PERCUTANEOUS CORONARY STENT INTERVENTION (PCI-S)  11/16/2013   Procedure: PERCUTANEOUS CORONARY STENT INTERVENTION (PCI-S);  Surgeon: Blane Ohara, MD;  Location: [facility] CATH LAB;  Service: Cardiovascular;;     reports that he has been smoking cigarettes.  He has a 15.00 pack-year smoking history. he has never used smokeless tobacco. He reports that he does not drink alcohol or use drugs.  Allergies  Allergen Reactions  . Penicillins Other (See Comments)    Unknown childhood reaction    Family History  Problem Relation Age of Onset  . Depression Mother   . Cancer Mother        ovarian  . Hypertension Mother        Died, 34  . Other Father        motor vehicle accident  . Coronary artery disease Unknown   . Heart attack Neg Hx   . Stroke Neg Hx     Prior to Admission medications   Medication Sig Start Date End Date Taking? Authorizing Provider   albuterol (PROVENTIL HFA;VENTOLIN HFA) 108 (90 Base) MCG/ACT inhaler Inhale 2 puffs into the lungs every 6 (six) hours as needed for wheezing or shortness of breath. 12/18/16  Yes Biagio Borg, MD  aspirin 81 MG tablet Take 81 mg by mouth daily.   Yes [provider]  carvedilol (COREG) 6.25 MG tablet Take 1 tablet (6.25 mg total) by mouth 2 (two) times daily with a meal. *Please call and schedule an appointment* 05/09/17  Yes Biagio Borg, MD  cetirizine (ZYRTEC) 10 MG tablet Take 1 tablet (10 mg total) by mouth daily. 12/18/16 12/18/17 Yes Biagio Borg, MD  cloNIDine (CATAPRES) 0.3 MG tablet Take 1 tablet (0.3 mg total) by mouth 2 (two) times daily. 05/09/17  Yes Biagio Borg, MD  clopidogrel (PLAVIX) 75 MG tablet Take 1 tablet (75 mg total) by mouth daily. *Please call and schedule an appointment* 05/09/17  Yes Biagio Borg, MD  escitalopram (LEXAPRO) 20 MG tablet Take 1 tablet (  20 mg total) by mouth daily. 05/09/17  Yes Biagio Borg, MD  furosemide (LASIX) 40 MG tablet SEE NOTES Patient taking differently: Take 40-80 mg by mouth 2 (two) times daily. Take 80 mg in the morning , take 40 mg in the evening  Take an extra 40 mg tablet as needed for weight for gain 3 lbs or more 05/09/17  Yes Biagio Borg, MD  gabapentin (NEURONTIN) 300 MG capsule TAKE 1 CAPSULE(300 MG) BY MOUTH three times daily 09/05/16  Yes Biagio Borg, MD  glipiZIDE (GLUCOTROL XL) 2.5 MG 24 hr tablet Take 1 tablet (2.5 mg total) by mouth daily with breakfast. 05/09/17  Yes Biagio Borg, MD  isosorbide mononitrate (IMDUR) 30 MG 24 hr tablet Take 3 tablets (90 mg total) by mouth daily. 05/09/17  Yes Biagio Borg, MD  lactulose [facility]) 10 GM/15ML solution TAKE 30 ML BY MOUTH TWICE DAILY AS NEEDED 12/29/15  Yes Biagio Borg, MD  losartan (COZAAR) 100 MG tablet TAKE 1 TABLET(100 MG) BY MOUTH DAILY 05/09/17  Yes Biagio Borg, MD  Multiple Vitamins-Minerals (MULTIVITAMIN WITH MINERALS) tablet Take 1 tablet by mouth  daily. CENTRUM SILVER MENS  50 PLUS   Yes [provider]  nitroGLYCERIN (NITROSTAT) 0.4 MG SL tablet PLACE 1 TABLET UNDER THE TONGUE EVERY 5 MINUTES AS NEEDED FOR CHEST PAIN 05/09/17  Yes Biagio Borg, MD  pantoprazole (PROTONIX) 40 MG tablet TAKE 1 TABLET(40 MG) BY MOUTH DAILY 05/09/17  Yes Biagio Borg, MD  potassium chloride (K-DUR) 10 MEQ tablet TAKE 2 TABLETS BY MOUTH EVERY MORNING AND 1 TABLET BY MOUTH EVERY EVENING Patient taking differently: 20 mEq 2 (two) times daily. TAKE 2 TABLETS BY MOUTH EVERY MORNING AND 1 TABLET BY MOUTH EVERY EVENING 05/09/17  Yes Biagio Borg, MD  rosuvastatin (CRESTOR) 40 MG tablet Take 1 tablet (40 mg total) by mouth daily. 05/09/17  Yes Biagio Borg, MD  traMADol (ULTRAM) 50 MG tablet Take 1 tablet (50 mg total) by mouth every 6 (six) hours as needed. 05/09/17  Yes Biagio Borg, MD  triamcinolone (NASACORT) 55 MCG/ACT AERO nasal inhaler Place 2 sprays into the nose daily. Patient not taking: Reported on 05/29/2017 05/09/17   Biagio Borg, MD    Physical Exam:  Constitutional: Obese male in NAD, calm, comfortable Vitals:   05/29/17 2100 05/29/17 2200 05/29/17 2215 05/29/17 2230  BP: 126/86 (!) 150/89 (!) 149/78 130/79  Pulse:  (!) 49    Resp: (!) 22 16 13    Temp:      TempSrc:      SpO2: 97% 98% 100% 95%   Eyes: PERRL, lids and conjunctivae normal ENMT: Mucous membranes are moist. Posterior pharynx clear of any exudate or lesions. Neck: normal, supple, no masses, no thyromegaly Respiratory: Decreased overall aeration, but no wheezes or rhonchi appreciated.  Patient currently on 2 L nasal cannula oxygen O2 saturations 99%. Cardiovascular: Bradycardic, no murmurs / rubs / gallops. No extremity edema. 2+ pedal pulses. No carotid bruits.  Abdomen: no tenderness, no masses palpated. No hepatosplenomegaly. Bowel sounds positive.  Musculoskeletal: no clubbing / cyanosis. No joint deformity upper and lower extremities. Good ROM, no  contractures. Normal muscle tone.  Skin: no rashes, lesions, ulcers. No induration Neurologic: CN 2-12 grossly intact. Sensation intact, DTR normal. Strength 5/5 in all 4.  Psychiatric: Normal judgment and insight. Alert and oriented x 3. Normal mood.     Labs on Admission: I have personally reviewed following labs  and imaging studies  CBC: Recent Labs  Lab 05/29/17 1529  WBC 6.4  HGB 15.4  HCT 46.2  MCV 87.2  PLT 101   Basic Metabolic Panel: Recent Labs  Lab 05/29/17 1529  NA 136  K 3.3*  CL 107  CO2 21*  GLUCOSE 148*  BUN 18  CREATININE 1.49*  CALCIUM 9.3   GFR: CrCl cannot be calculated (Unknown ideal weight.). Liver Function Tests: No results for input(s): AST, ALT, ALKPHOS, BILITOT, PROT, ALBUMIN in the last 168 hours. No results for input(s): LIPASE, AMYLASE in the last 168 hours. No results for input(s): AMMONIA in the last 168 hours. Coagulation Profile: Recent Labs  Lab 05/29/17 1953  INR 1.00   Cardiac Enzymes: No results for input(s): CKTOTAL, CKMB, CKMBINDEX, TROPONINI in the last 168 hours. BNP (last 3 results) Recent Labs    05/14/17 0818  PROBNP 153.0*   HbA1C: No results for input(s): HGBA1C in the last 72 hours. CBG: No results for input(s): GLUCAP in the last 168 hours. Lipid Profile: No results for input(s): CHOL, HDL, LDLCALC, TRIG, CHOLHDL, LDLDIRECT in the last 72 hours. Thyroid Function Tests: No results for input(s): TSH, T4TOTAL, FREET4, T3FREE, THYROIDAB in the last 72 hours. Anemia Panel: No results for input(s): VITAMINB12, FOLATE, FERRITIN, TIBC, IRON, RETICCTPCT in the last 72 hours. Urine analysis:    Component Value Date/Time   COLORURINE YELLOW 05/14/2017 0818   APPEARANCEUR CLEAR 05/14/2017 0818   LABSPEC >=1.030 (A) 05/14/2017 0818   PHURINE 5.5 05/14/2017 0818   GLUCOSEU NEGATIVE 05/14/2017 0818   HGBUR NEGATIVE 05/14/2017 0818   HGBUR negative 08/23/2009 0000   BILIRUBINUR NEGATIVE 05/14/2017 0818    KETONESUR NEGATIVE 05/14/2017 0818   PROTEINUR 100 (A) 02/11/2015 0701   UROBILINOGEN 0.2 05/14/2017 0818   NITRITE NEGATIVE 05/14/2017 0818   LEUKOCYTESUR NEGATIVE 05/14/2017 0818   Sepsis Labs: No results found for this or any previous visit (from the past 240 hour(s)).   Radiological Exams on Admission: Dg Chest 2 View  Result Date: 05/29/2017 CLINICAL DATA:  Chest pain shortness of breath. EXAM: CHEST  2 VIEW COMPARISON:  12/15/2015 FINDINGS: The lungs are clear without focal pneumonia, edema, pneumothorax or pleural effusion. Linear chronic atelectasis or scarring at the right base is stable. Cardiopericardial silhouette is at upper limits of normal for size. The visualized bony structures of the thorax are intact. IMPRESSION: Stable.  No acute findings. Electronically Signed   By: Misty Stanley M.D.   On: 05/29/2017 15:59   Ct Angio Chest Pe W And/or Wo Contrast  Result Date: 05/29/2017 CLINICAL DATA:  Shortness of breath and chest pain EXAM: CT ANGIOGRAPHY CHEST WITH CONTRAST TECHNIQUE: Multidetector CT imaging of the chest was performed using the standard protocol during bolus administration of intravenous contrast. Multiplanar CT image reconstructions and MIPs were obtained to evaluate the vascular anatomy. CONTRAST:  162mL ISOVUE-370 IOPAMIDOL (ISOVUE-370) INJECTION 76% COMPARISON:  Chest radiograph May 29, 2017 FINDINGS: Cardiovascular: There is no demonstrable pulmonary embolus. There is no appreciable thoracic aortic aneurysm. No dissection seen, although the contrast bolus is less than optimal for dissection assessment. Visualized great vessels appear unremarkable. There are foci of thoracic aortic calcification. There are foci of coronary artery calcification. There is no appreciable pericardial effusion or thickening. Mediastinum/Nodes: Visualized thyroid appears normal. There is a right hilar lymph node measuring 1.4 x 1.1 cm. There is no other appreciable lymph node  prominence. There is a small hiatal hernia. Lungs/Pleura: There is patchy bilateral lower lobe atelectatic change.  There is also atelectatic change in the posterior aspect of the right upper lobe inferiorly. There is no frank edema or consolidation. There is no appreciable pleural effusion or pleural thickening. Upper Abdomen: There is reflux of contrast into the inferior vena cava and hepatic veins. Visualized upper abdominal structures otherwise appear unremarkable. Musculoskeletal: There are foci of degenerative change in the lower thoracic region. There are no blastic or lytic bone lesions. Review of the MIP images confirms the above findings. IMPRESSION: 1.  No demonstrable pulmonary embolus. 2. Areas of atelectatic change bilaterally. No frank edema or consolidation. 3. Single mildly enlarged right hilar lymph node. Suspect reactive etiology. 4. Aortic atherosclerosis. Foci of coronary artery calcification noted. 5. Reflux of contrast into the inferior vena cava and hepatic veins may be indicative of increased right heart pressure. Aortic Atherosclerosis (ICD10-I70.0). Electronically Signed   By: Lowella Grip III M.D.   On: 05/29/2017 21:58    EKG: Independently reviewed.  Sinus bradycardia at 48 bpm  Assessment/Plan Chest pain: Patient presents with complaints of centralized chest pressure and shortness of breath.  Heart score =5. - Admit to telemetry bed - Trend cardiac enzymes - Check echocardiogram - Will need to consult cardiology in a.m. - Follow-up telemetry overnight given bradycardia  Shortness of breath/dyspnea: Patient presents with otherwise a clear chest x-ray.  Physical exam decreased overall aeration but no wheezes appreciated.  CT angiogram of the chest showed no significant signs of a pulmonary embolus but areas of atelectasis. - Continue oxygen for patient comfort - DuoNeb's prn sob/wheezing  Essential hypertension - Continue clonidine, carvedilol, and furosemide    Bradycardia - Follow-up telemetry heart rates noted to be in the 40s-50s.  Hypokalemia: Potassium 3.3 on admission - Give 20 mEq of potassium chloride IV - Continue to monitor and replace as needed  CAD s/p stents: Patient with history of multiple PCI's and stent placement. - Continue isosorbide mononitrate, aspirin, and Plavix   DM type II: Well controlled with oral medications of glipizide.  Last hemoglobin A1c noted to be 6.3 on 05/14/2017. - Hypoglycemic protocols - Hold glipizide - CBGs q. before meals and at bedtime with sensitive SSI   Chronic kidney disease stage III: Creatinine on admission is 1.49 which appears appears to be in a range that appears similar to previous. - Continue to monitor  Obstructive sleep apnea: Notes intermittent compliance with CPAP. - CPAP per patient  Tobacco abuse: Patient reports quitting approximately 1 year ago  DVT prophylaxis: Lovenox Code Status: full  Family Communication: None Disposition Plan: Possible discharge home in 1-2 days if cardiac workup negative Consults called: none  Admission status: observation  Norval Morton MD [facility] Pager [phone]   If 7PM-7AM, please contact night-coverage [email or web address] Password [email or web address]  05/29/2017, 11:07 PM

## 2017-05-29 NOTE — ED Notes (Signed)
EKG given to Dr. Nanavati. 

## 2017-05-30 ENCOUNTER — Other Ambulatory Visit: Payer: Self-pay

## 2017-05-30 ENCOUNTER — Encounter (HOSPITAL_COMMUNITY): Payer: Self-pay | Admitting: Emergency Medicine

## 2017-05-30 DIAGNOSIS — I10 Essential (primary) hypertension: Secondary | ICD-10-CM | POA: Diagnosis not present

## 2017-05-30 DIAGNOSIS — Z9861 Coronary angioplasty status: Secondary | ICD-10-CM | POA: Diagnosis not present

## 2017-05-30 DIAGNOSIS — R0789 Other chest pain: Secondary | ICD-10-CM | POA: Diagnosis not present

## 2017-05-30 DIAGNOSIS — R079 Chest pain, unspecified: Secondary | ICD-10-CM | POA: Diagnosis not present

## 2017-05-30 DIAGNOSIS — I2721 Secondary pulmonary arterial hypertension: Secondary | ICD-10-CM | POA: Diagnosis not present

## 2017-05-30 DIAGNOSIS — G4733 Obstructive sleep apnea (adult) (pediatric): Secondary | ICD-10-CM | POA: Diagnosis not present

## 2017-05-30 DIAGNOSIS — Z87891 Personal history of nicotine dependence: Secondary | ICD-10-CM | POA: Diagnosis not present

## 2017-05-30 DIAGNOSIS — R0609 Other forms of dyspnea: Secondary | ICD-10-CM

## 2017-05-30 DIAGNOSIS — E1159 Type 2 diabetes mellitus with other circulatory complications: Secondary | ICD-10-CM

## 2017-05-30 DIAGNOSIS — I2510 Atherosclerotic heart disease of native coronary artery without angina pectoris: Secondary | ICD-10-CM | POA: Diagnosis not present

## 2017-05-30 DIAGNOSIS — E876 Hypokalemia: Secondary | ICD-10-CM | POA: Diagnosis present

## 2017-05-30 LAB — LIPID PANEL
Cholesterol: 123 mg/dL (ref 0–200)
HDL: 44 mg/dL (ref >40)
LDL CALC: 68 mg/dL (ref 0–99)
Total CHOL/HDL Ratio: 2.8 RATIO
Triglycerides: 57 mg/dL (ref <150)
VLDL: 11 mg/dL (ref 0–40)

## 2017-05-30 LAB — CBC WITH DIFFERENTIAL/PLATELET
BASOS PCT: 0 %
Basophils Absolute: 0 10*3/uL (ref 0.0–0.1)
Eosinophils Absolute: 0.1 10*3/uL (ref 0.0–0.7)
Eosinophils Relative: 2 %
HEMATOCRIT: 45.2 % (ref 39.0–52.0)
Hemoglobin: 14.9 g/dL (ref 13.0–17.0)
Lymphocytes Relative: 61 %
Lymphs Abs: 4.5 10*3/uL — ABNORMAL HIGH (ref 0.7–4.0)
MCH: 28.9 pg (ref 26.0–34.0)
MCHC: 33 g/dL (ref 30.0–36.0)
MCV: 87.8 fL (ref 78.0–100.0)
MONO ABS: 0.7 10*3/uL (ref 0.1–1.0)
MONOS PCT: 10 %
Neutro Abs: 1.9 10*3/uL (ref 1.7–7.7)
Neutrophils Relative %: 27 %
Platelets: 192 10*3/uL (ref 150–400)
RBC: 5.15 MIL/uL (ref 4.22–5.81)
RDW: 15.8 % — AB (ref 11.5–15.5)
WBC: 7.2 10*3/uL (ref 4.0–10.5)

## 2017-05-30 LAB — BASIC METABOLIC PANEL
Anion gap: 7 (ref 5–15)
BUN: 27 mg/dL — AB (ref 6–20)
CALCIUM: 9.2 mg/dL (ref 8.9–10.3)
CO2: 27 mmol/L (ref 22–32)
CREATININE: 1.77 mg/dL — AB (ref 0.61–1.24)
Chloride: 107 mmol/L (ref 101–111)
GFR calc non Af Amer: 40 mL/min — ABNORMAL LOW (ref >60)
GFR, EST AFRICAN AMERICAN: 46 mL/min — AB (ref >60)
Glucose, Bld: 93 mg/dL (ref 65–99)
Potassium: 4.7 mmol/L (ref 3.5–5.1)
SODIUM: 141 mmol/L (ref 135–145)

## 2017-05-30 LAB — GLUCOSE, CAPILLARY
Glucose-Capillary: 114 mg/dL — ABNORMAL HIGH (ref 65–99)
Glucose-Capillary: 95 mg/dL (ref 65–99)

## 2017-05-30 LAB — TROPONIN I
TROPONIN I: 0.06 ng/mL — AB (ref <0.03)
Troponin I: 0.06 ng/mL (ref <0.03)
Troponin I: 0.05 ng/mL (ref <0.03)

## 2017-05-30 MED ORDER — ACETAMINOPHEN 325 MG PO TABS: 650.0000 mg | ORAL_TABLET | ORAL | Status: DC | PRN

## 2017-05-30 MED ORDER — CLONIDINE HCL 0.2 MG PO TABS
0.3000 mg | ORAL_TABLET | Freq: Two times a day (BID) | ORAL | Status: DC
  Administered 2017-05-30: 0.3 mg via ORAL
  Filled 2017-05-30: qty 1

## 2017-05-30 MED ORDER — CARVEDILOL 6.25 MG PO TABS
6.2500 mg | ORAL_TABLET | Freq: Two times a day (BID) | ORAL | Status: DC
  Administered 2017-05-30 – 2017-05-31 (×3): 6.25 mg via ORAL
  Filled 2017-05-30 (×3): qty 1

## 2017-05-30 MED ORDER — ONDANSETRON HCL 4 MG/2ML IJ SOLN: 4.0000 mg | Freq: Four times a day (QID) | INTRAMUSCULAR | Status: DC | PRN

## 2017-05-30 MED ORDER — INSULIN ASPART 100 UNIT/ML ~~LOC~~ SOLN: 0.0000 [IU] | Freq: Three times a day (TID) | SUBCUTANEOUS | Status: DC

## 2017-05-30 MED ORDER — MORPHINE SULFATE (PF) 4 MG/ML IV SOLN: 2.0000 mg | INTRAVENOUS | Status: DC | PRN

## 2017-05-30 MED ORDER — IPRATROPIUM-ALBUTEROL 0.5-2.5 (3) MG/3ML IN SOLN
3.0000 mL | Freq: Four times a day (QID) | RESPIRATORY_TRACT | Status: DC | PRN
Start: 2017-05-30 — End: 2017-05-31

## 2017-05-30 MED ORDER — ISOSORBIDE MONONITRATE ER 60 MG PO TB24
90.0000 mg | ORAL_TABLET | Freq: Every day | ORAL | Status: DC
  Administered 2017-05-30 – 2017-05-31 (×2): 90 mg via ORAL
  Filled 2017-05-30: qty 1
  Filled 2017-05-30: qty 3

## 2017-05-30 MED ORDER — ASPIRIN 81 MG PO CHEW
81.0000 mg | CHEWABLE_TABLET | Freq: Every day | ORAL | Status: DC
  Administered 2017-05-30 – 2017-05-31 (×2): 81 mg via ORAL
  Filled 2017-05-30 (×2): qty 1

## 2017-05-30 MED ORDER — FUROSEMIDE 80 MG PO TABS
80.0000 mg | ORAL_TABLET | Freq: Every day | ORAL | Status: DC
  Administered 2017-05-31: 80 mg via ORAL
  Filled 2017-05-30: qty 1

## 2017-05-30 MED ORDER — CLONIDINE HCL 0.1 MG PO TABS
0.1000 mg | ORAL_TABLET | Freq: Two times a day (BID) | ORAL | Status: DC
  Administered 2017-05-30 – 2017-05-31 (×2): 0.1 mg via ORAL
  Filled 2017-05-30 (×2): qty 1

## 2017-05-30 MED ORDER — LORATADINE 10 MG PO TABS
10.0000 mg | ORAL_TABLET | Freq: Every day | ORAL | Status: DC
  Administered 2017-05-30 – 2017-05-31 (×2): 10 mg via ORAL
  Filled 2017-05-30 (×2): qty 1

## 2017-05-30 MED ORDER — GABAPENTIN 300 MG PO CAPS
300.0000 mg | ORAL_CAPSULE | Freq: Three times a day (TID) | ORAL | Status: DC
  Administered 2017-05-30 – 2017-05-31 (×4): 300 mg via ORAL
  Filled 2017-05-30 (×4): qty 1

## 2017-05-30 MED ORDER — POTASSIUM CHLORIDE 10 MEQ/100ML IV SOLN
10.0000 meq | INTRAVENOUS | Status: AC
  Administered 2017-05-30 (×2): 10 meq via INTRAVENOUS
  Filled 2017-05-30 (×2): qty 100

## 2017-05-30 MED ORDER — NITROGLYCERIN 0.4 MG SL SUBL: 0.4000 mg | SUBLINGUAL_TABLET | SUBLINGUAL | Status: DC | PRN

## 2017-05-30 MED ORDER — CLOPIDOGREL BISULFATE 75 MG PO TABS
75.0000 mg | ORAL_TABLET | Freq: Every day | ORAL | Status: DC
Start: 2017-05-30 — End: 2017-05-31
  Administered 2017-05-30 – 2017-05-31 (×2): 75 mg via ORAL
  Filled 2017-05-30 (×2): qty 1

## 2017-05-30 MED ORDER — IPRATROPIUM-ALBUTEROL 0.5-2.5 (3) MG/3ML IN SOLN
3.0000 mL | RESPIRATORY_TRACT | Status: AC
  Administered 2017-05-30: 3 mL via RESPIRATORY_TRACT
  Filled 2017-05-30: qty 3

## 2017-05-30 MED ORDER — FUROSEMIDE 40 MG PO TABS
40.0000 mg | ORAL_TABLET | Freq: Every day | ORAL | Status: DC
  Administered 2017-05-30: 40 mg via ORAL
  Filled 2017-05-30: qty 1

## 2017-05-30 MED ORDER — GI COCKTAIL ~~LOC~~: 30.0000 mL | Freq: Four times a day (QID) | ORAL | Status: DC | PRN

## 2017-05-30 MED ORDER — ENOXAPARIN SODIUM 30 MG/0.3ML ~~LOC~~ SOLN
30.0000 mg | Freq: Every day | SUBCUTANEOUS | Status: DC
  Administered 2017-05-30 (×2): 30 mg via SUBCUTANEOUS
  Filled 2017-05-30 (×3): qty 0.3

## 2017-05-30 NOTE — ED Notes (Signed)
Tamala Julian, MD at bedside.

## 2017-05-30 NOTE — Progress Notes (Signed)
RT placed pt on CPAP with L full faced mask and a pressure of 7. Pt tolerating well. RT to cont to monitor.

## 2017-05-30 NOTE — ED Notes (Signed)
Critical troponin of 0.06 reported to Tamala Julian, MD

## 2017-05-30 NOTE — Consult Note (Addendum)
Cardiology Consultation:   Patient ID: [name] [name]; [id number]; [birth year]   Admit date: 05/29/2017 Date of Consult: 05/30/2017  Primary Care Provider: Biagio Borg, MD Primary Cardiologist: Dr. [name]    Patient Profile:   [name] [name] is a 61 y.o. male with a hx of CAD w multiple stents, ischemic cardiomyopathy, mild systolic CHF, HTN, DM, HLD who is being seen today for the evaluation of SOB and CP at the request of Dr. Daleen Bo.  History of Present Illness:   [name] [name] is a 61yo M with PMH CAD with multiple stents, ischemic cardiomyopathy, mild systolic CHF, HTN, DM, HLD, OSA with intermittent compliance with CPAP, presents with DOE x 2 days a/w centralized chest pressure x1 day. Patient was in his usual state of health until Tuesday 12/18, when he noticed DOE with ADLs.States the DOE felt similar to previous CHF exacerbations. States the DOE got progressively worse over the next 24 hours to the point that he felt like he was breathing through a straw when ambulating to the bathroom. He denies any LE edema. Has had some orthopnea. On Wednesday morning (12/19), patient reports feeling central chest pressure which did not radiate and was not associated with diaphoresis, lightheadedness, dizziness, N/V, palpitations. States he then had a watery BM which relieved some of the chest pressure. EMS was activated and patient received 324mg  ASA en route to ED. Since arriving to the ED, patient received a nebulizer and O2 via [place] which improved his symptoms. CP and SOB at rest have resolved. Has not ambulated in ED to assess DOE. Patient reports compliance with medications and diet. Has made lifestyle changes over the past year including smoking cessation and increase exercise regimen.   In the ED, patient VS afebrile, satting well on RA, with stable Bps, and bradycardia. CXR with clear lungs. Troponin 0.06>0.06>0.05. EKG with sinus bradycardia, RBBB, non-ischemic. BNP 75. CXR with clear lungs. CT PE  without PE. Patient received duo-nebulizer x1.    Past Medical History:  Diagnosis Date  . CAD (coronary artery disease)    a. s/p PCI/BMS pRCA 2002; b. PCI pRCA & DES p/m RCA 2006; c. PCI/DES OM4 2007; d. PCI/CBA to RCA for ISR 10/2006; e.08/2012 STEMI/Cath/PCI:  LAD 95% >> PCI: 3.0x20 Promus Prem DES  //  f. NSTEMI 10/15 >> LHC: mLAD stent ok, dLAD 70, OM1 CTO, OM2 stent ok, dOM2 90, OM3 30-40, dLCx 90, p-mRCA stent ok, mRCA stent ok w/ 60-70 ISR, EF 50% >> med Rx  . Chronic combined systolic and diastolic CHF (congestive heart failure) ([place])    a. Myoview 1/17: EF 35%  //  b. Echo 1/17 EF 45-50%  . Depression   . Diabetes mellitus ([place])    a. A1c 8.8 08/2012->Metformin initiated. => b. A1c (9/14): 6.6  . GERD (gastroesophageal reflux disease)   . History of nuclear stress test    a. Myoview 1/17: EF 35%, fixed inferior lateral defect consistent with infarct, no ischemia, intermediate risk  . History of pneumonia   . HTN (hypertension)   . Hyperlipidemia   . Ischemic cardiomyopathy    a. EF 40%; improved to normal;  b. 08/2012 Echo: EF 50-55%, mod LVH.//  c. Echo 9/16: Inf HK, mild LVH, EF 55%, mild LAE, normal RVF, mild RAE, PASP 35 mmHg  //  d. Echo 1/17: EF 45-50%, inferior HK, mild BAE, PASP 33 mmHg  . NSTEMI (non-ST elevated myocardial infarction) ([place])   . Obesity   . OSA (obstructive sleep apnea)  Does not use CPAP as of 05/2011  . Tobacco abuse     Past Surgical History:  Procedure Laterality Date  . CORONARY ANGIOPLASTY WITH STENT PLACEMENT    . CORONARY STENT PLACEMENT  2009  . LEFT HEART CATHETERIZATION WITH CORONARY ANGIOGRAM N/A 08/31/2012   Procedure: LEFT HEART CATHETERIZATION WITH CORONARY ANGIOGRAM;  Surgeon: Burnell Blanks, MD;  Location: [facility] CATH LAB;  Service: Cardiovascular;  Laterality: N/A;  . LEFT HEART CATHETERIZATION WITH CORONARY ANGIOGRAM N/A 11/16/2013   Procedure: LEFT HEART CATHETERIZATION WITH CORONARY ANGIOGRAM;  Surgeon: Blane Ohara, MD;   Location: [facility] CATH LAB;  Service: Cardiovascular;  Laterality: N/A;  . LEFT HEART CATHETERIZATION WITH CORONARY ANGIOGRAM N/A 03/15/2014   Procedure: LEFT HEART CATHETERIZATION WITH CORONARY ANGIOGRAM;  Surgeon: Peter M Martinique, MD;  Location: [facility] CATH LAB;  Service: Cardiovascular;  Laterality: N/A;  . PERCUTANEOUS CORONARY STENT INTERVENTION (PCI-S) Right 08/31/2012   Procedure: PERCUTANEOUS CORONARY STENT INTERVENTION (PCI-S);  Surgeon: Burnell Blanks, MD;  Location: [facility] CATH LAB;  Service: Cardiovascular;  Laterality: Right;  . PERCUTANEOUS CORONARY STENT INTERVENTION (PCI-S)  11/16/2013   Procedure: PERCUTANEOUS CORONARY STENT INTERVENTION (PCI-S);  Surgeon: Blane Ohara, MD;  Location: [facility] CATH LAB;  Service: Cardiovascular;;      Inpatient Medications: Scheduled Meds: . aspirin  81 mg Oral Daily  . carvedilol  6.25 mg Oral BID WC  . cloNIDine  0.1 mg Oral BID  . clopidogrel  75 mg Oral Daily  . enoxaparin (LOVENOX) injection  30 mg Subcutaneous QHS  . furosemide  40 mg Oral q1800  . furosemide  80 mg Oral Daily  . gabapentin  300 mg Oral TID  . isosorbide mononitrate  90 mg Oral Daily  . loratadine  10 mg Oral Daily   Continuous Infusions:  PRN Meds: acetaminophen, gi cocktail, ipratropium-albuterol, morphine injection, nitroGLYCERIN, ondansetron (ZOFRAN) IV  Allergies:    Allergies  Allergen Reactions  . Penicillins Other (See Comments)    Unknown childhood reaction    Social History:   Social History   Socioeconomic History  . Marital status: Single    Spouse name: Not on file  . Number of children: 0  . Years of education: Not on file  . Highest education level: Not on file  Social Needs  . Financial resource strain: Not on file  . Food insecurity - worry: Not on file  . Food insecurity - inability: Not on file  . Transportation needs - medical: Not on file  . Transportation needs - non-medical: Not on file  Occupational History  . Occupation: retired    Tobacco Use  . Smoking status: Current Every Day Smoker    Packs/day: 0.50    Years: 30.00    Pack years: 15.00    Types: Cigarettes  . Smokeless tobacco: Never Used  . Tobacco comment: trying to cut down  Substance and Sexual Activity  . Alcohol use: No  . Drug use: No    Comment: remote marijuana use  . Sexual activity: Not Currently  Other Topics Concern  . Not on file  Social History Narrative   Lives alone.  He has no children.   He retired early due to health problems.          Family History:    Family History  Problem Relation Age of Onset  . Depression Mother   . Cancer Mother        ovarian  . Hypertension Mother  Died, 62  . Other Father        motor vehicle accident  . Coronary artery disease Unknown   . Heart attack Neg Hx   . Stroke Neg Hx      ROS:  Please see the history of present illness.   All other ROS reviewed and negative.     Physical Exam/Data:   Vitals:   05/30/17 1315 05/30/17 1330 05/30/17 1400 05/30/17 1415  BP: (!) 155/94 (!) 154/89 (!) 170/95 (!) 163/99  Pulse: 61 (!) 47 (!) 55 (!) 56  Resp: 16 17 19 15   Temp:      TempSrc:      SpO2: 96% 96% 97% 97%  Weight:      Height:        Intake/Output Summary (Last 24 hours) at 05/30/2017 1520 Last data filed at 05/30/2017 0615 Gross per 24 hour  Intake 200 ml  Output -  Net 200 ml   Filed Weights   05/30/17 0737  Weight: (!) 310 lb (140.6 kg)   Body mass index is 40.9 kg/m.  General:  Well nourished, well developed, in no acute distress HEENT: normal Lymph: no adenopathy Neck: no JVD Vascular: No carotid bruits; FA pulses 2+ bilaterally without bruits  Cardiac:  normal S1, S2; brady with regular rhythm; no murmur  Lungs:  clear to auscultation bilaterally, no wheezing, rhonchi or rales  Abd: soft, nontender, no hepatomegaly  Ext: no LE edema Musculoskeletal:  No deformities, BUE and BLE strength normal and equal Skin: warm and dry  Neuro:  CNs 2-12 intact,  no focal abnormalities noted Psych:  Normal affect   EKG:  The EKG was personally reviewed and demonstrates:  Rate 48, Sinus brady, RBBB Telemetry:  Telemetry was personally reviewed and demonstrates:  Sinus brady with PVCs  Relevant CV Studies: Echo 06/2015 Study Conclusions  - Left ventricle: The cavity size was normal. Wall thickness was   normal. Systolic function was mildly reduced. The estimated   ejection fraction was in the range of 45% to 50%. Akinesis of the   basalinferior myocardium. - Left atrium: The atrium was mildly dilated. - Right atrium: The atrium was mildly dilated. - Pulmonary arteries: Systolic pressure was mildly increased. PA   peak pressure: 33 mm Hg (S).  Stress Test 06/2015  Nuclear stress EF: 35%.  There was no ST segment deviation noted during stress.  This is an intermediate risk study.  Findings consistent with prior myocardial infarction.  The left ventricular ejection fraction is moderately decreased (30-44%).   Intermediate risk stress nuclear study with a large, severe, predominantly fixed inferior lateral defect consistent with prior infarct; no significant ischemia; EF 35 but visually appears better; akinesis of the basal and mid inferior lateral wall; mild LVE; suggest echo to better assess LV function. Study intermediate risk due to reduced LV function.   Laboratory Data:  Chemistry Recent Labs  Lab 05/29/17 1529 05/30/17 0225  NA 136 141  K 3.3* 4.7  CL 107 107  CO2 21* 27  GLUCOSE 148* 93  BUN 18 27*  CREATININE 1.49* 1.77*  CALCIUM 9.3 9.2  GFRNONAA 49* 40*  GFRAA 57* 46*  ANIONGAP 8 7    No results for input(s): PROT, ALBUMIN, AST, ALT, ALKPHOS, BILITOT in the last 168 hours. Hematology Recent Labs  Lab 05/29/17 1529 05/30/17 0225  WBC 6.4 7.2  RBC 5.30 5.15  HGB 15.4 14.9  HCT 46.2 45.2  MCV 87.2 87.8  MCH 29.1 28.9  MCHC 33.3 33.0  RDW 15.8* 15.8*  PLT 194 192   Cardiac Enzymes Recent Labs  Lab  05/30/17 0225 05/30/17 0457 05/30/17 0812  TROPONINI 0.06* 0.06* 0.05*    Recent Labs  Lab 05/29/17 1534 05/29/17 2004  TROPIPOC 0.05 0.04    BNP Recent Labs  Lab 05/29/17 1529  BNP 75.2    DDimer  Recent Labs  Lab 05/29/17 2018  DDIMER 0.71*    Radiology/Studies:  Dg Chest 2 View  Result Date: 05/29/2017 CLINICAL DATA:  Chest pain shortness of breath. EXAM: CHEST  2 VIEW COMPARISON:  12/15/2015 FINDINGS: The lungs are clear without focal pneumonia, edema, pneumothorax or pleural effusion. Linear chronic atelectasis or scarring at the right base is stable. Cardiopericardial silhouette is at upper limits of normal for size. The visualized bony structures of the thorax are intact. IMPRESSION: Stable.  No acute findings. Electronically Signed   By: Misty Stanley M.D.   On: 05/29/2017 15:59   Ct Angio Chest Pe W And/or Wo Contrast  Result Date: 05/29/2017 CLINICAL DATA:  Shortness of breath and chest pain EXAM: CT ANGIOGRAPHY CHEST WITH CONTRAST TECHNIQUE: Multidetector CT imaging of the chest was performed using the standard protocol during bolus administration of intravenous contrast. Multiplanar CT image reconstructions and MIPs were obtained to evaluate the vascular anatomy. CONTRAST:  143mL ISOVUE-370 IOPAMIDOL (ISOVUE-370) INJECTION 76% COMPARISON:  Chest radiograph May 29, 2017 FINDINGS: Cardiovascular: There is no demonstrable pulmonary embolus. There is no appreciable thoracic aortic aneurysm. No dissection seen, although the contrast bolus is less than optimal for dissection assessment. Visualized great vessels appear unremarkable. There are foci of thoracic aortic calcification. There are foci of coronary artery calcification. There is no appreciable pericardial effusion or thickening. Mediastinum/Nodes: Visualized thyroid appears normal. There is a right hilar lymph node measuring 1.4 x 1.1 cm. There is no other appreciable lymph node prominence. There is a small hiatal  hernia. Lungs/Pleura: There is patchy bilateral lower lobe atelectatic change. There is also atelectatic change in the posterior aspect of the right upper lobe inferiorly. There is no frank edema or consolidation. There is no appreciable pleural effusion or pleural thickening. Upper Abdomen: There is reflux of contrast into the inferior vena cava and hepatic veins. Visualized upper abdominal structures otherwise appear unremarkable. Musculoskeletal: There are foci of degenerative change in the lower thoracic region. There are no blastic or lytic bone lesions. Review of the MIP images confirms the above findings. IMPRESSION: 1.  No demonstrable pulmonary embolus. 2. Areas of atelectatic change bilaterally. No frank edema or consolidation. 3. Single mildly enlarged right hilar lymph node. Suspect reactive etiology. 4. Aortic atherosclerosis. Foci of coronary artery calcification noted. 5. Reflux of contrast into the inferior vena cava and hepatic veins may be indicative of increased right heart pressure. Aortic Atherosclerosis (ICD10-I70.0). Electronically Signed   By: Lowella Grip III M.D.   On: 05/29/2017 21:58    Assessment and Plan:   1. CAD:  s/p multiple stents. CP improved with BM and nebulizers, unlikely ACS. - Troponin 0.006>0.006>0.005 - downtrended, no need to evaluate further - EKG non-ischemic, sinus brady with RBBB - unchanged from previous - Last stress test 06/2015 with intermediate risk - patient with known residual disease not amenable to stenting on last cardiac cath (03/2014)  - Would not pursue further inpatient ischemic work up. Can consider outpatient stress test after SOB improved - Continue ASA, Plavix, Crestor, Coreg  2. Systolic CHF - BNP 75, CXR with clear lungs - Overall does  not appear to be overloaded on exam - Continue home lasix, losartan, coreg, imdur  3. SOB/DOE: possible asthma vs undiagnosed COPD component - Endorses history of childhood asthma which he states  has flared up recently. Has used albuterol inhaler to help with SOB. Also endorses dry cough.  - Satting well on RA. Does not appear to be volume overloaded - Consider PFTs when symptoms improved  4. HTN - BP controlled - Continue home losartan, imdur, clonidine, coreg, lasix  5. HLD - Lipid panel: T cholesterol 123, HDL 44, LDL 68, triglycerides 57 - Continue home crestor  6. DM - Hgb A1C 05/14/17: 6.3 - Continue current outpatient regimen at discharge.  For questions or updates, please contact [place] Please consult [email or web address] for contact info under Cardiology/STEMI.   Signed, Abigail Butts, PA-C  05/30/2017 3:20 PM   I have seen and examined the patient along with Abigail Butts, PA-C.  I have reviewed the chart, notes and new data.  I agree with PA's note.  Key new complaints: Dominant complaint is shortness of breath which has resolved without specific cardiac therapy (no diuretics), better after bronchodilators.  Chest pressure occurred only when he tried to walk despite shortness of breath.  Not having any symptoms reminiscent of previous acute coronary events. Key examination changes: Obese, but otherwise normal cardiovascular exam, no edema, no JVD, no gallop.  Clear lungs on exam.  Note that the weight recorded today is an estimation, not a true measurement. Key new findings / data: Chest x-ray without evidence of fluid overload.  BNP 75.  Nominal abnormalities in cardiac troponin I" plateau" pattern, peak 0.06 not likely clinically meaningful.  Definitely not indication for acute coronary event.  PLAN: Suspect [name] [name]'s dyspnea is pulmonary in etiology.  An echocardiogram has been ordered and is appropriate, he did have possible orthopnea.  Hopefully will allow [place] to distinguish better between pulmonary and cardiac etiology of dyspnea.  No change in cardiac therapy recommended at this time.  Sanda Klein, MD, [place] [phone] 05/30/2017, 5:44 PM

## 2017-05-30 NOTE — ED Notes (Signed)
Rn paged Attending.

## 2017-05-30 NOTE — Plan of Care (Signed)
Pt does not c/o any CP. Will continue to monitor overnight and continue current plan of care.

## 2017-05-30 NOTE — ED Notes (Signed)
Admitting provider at bedside for evaluation.

## 2017-05-30 NOTE — ED Notes (Addendum)
Cardiology at bedside.  Dr. Sallyanne Kuster

## 2017-05-30 NOTE — ED Notes (Signed)
Discontinue 18 g in [facility] confirmed with Tamala Julian, MD due to current IV access status.

## 2017-05-30 NOTE — ED Notes (Signed)
Tamala Julian, MD authorized this RN to give catapres with diet status.

## 2017-05-30 NOTE — Progress Notes (Signed)
[facility] PROGRESS NOTE  [name] [name] WVP:[id number] DOB: April 01, 1956 DOA: 05/29/2017 PCP: Biagio Borg, MD  Brief summary   61 y.o. male with medical history significant of CAD with multiple prior stents, ischemic cardiomyopathy, systolic CHF last EF 54-62% in 06/2015, HTN, HLD, GERD morbid obesity, and OSA with intermittent use of CPAP; who presents with complaints of shortness of breath over the last 2 days and centralized chest pressure.  Reported feeling as though he was breathing through a straw.  En route with EMS patient received 324 mg of aspirin.    ED Course: Upon admission into the emergency department patient was noted to afebrile, pulse anywhere from 46-64, respirations 13-22, blood pressure 122/80 150/89, O2 saturations 95-100% on room air.  Patient was never noted to be hypoxic, but reported improvement of breathing on being placed on 2 L nasal cannula oxygen.  Patient notes that chest pain symptoms completely resolved following utilizing the restroom.    Assessment/Plan:  Chest pain: Patient presents with complaints of centralized chest pressure and shortness of breath.  Heart score =5. Monitor cardiac enzymes, obtain echocardiogram, consulted cardiology for stress test eval. Cont antiplatelet, statin, monitor for bradycardia on BB  CAD s/p stents: Patient with history of multiple PCI's and stent placement. - as above, continue isosorbide mononitrate, aspirin, and Plavix   Shortness of breath/dyspnea: CT angiogram of the chest showed no significant signs of a pulmonary embolus but areas of atelectasis. No wheezing on exam.  DuoNeb's prn sob/wheezing  Essential hypertension.  Continue  carvedilol, and furosemide. Decreased clonidine due to bradycardia   Bradycardia.  Follow-up telemetry heart rates noted to be in the 40s-50s.asymptomatic. decreased clonidine. Monitor   Hypokalemia: replaced   DM type II: Well controlled with oral medications of glipizide.   Last hemoglobin A1c noted to be 6.3 on 05/14/2017. - Hold glipizide. Monitor on ISS  Chronic kidney disease stage III: Creatinine on admission is 1.49 which appears appears to be in a range that appears similar to previous.  Continue to monitor  Obstructive sleep apnea: Notes intermittent compliance with CPAP.  CPAP per patient  Tobacco abuse: Patient reports quitting approximately 1 year ago    Code Status: full Family Communication: d/w patient, RN (indicate person spoken with, relationship, and if by phone, the number) Disposition Plan: home pend improvement    Consultants:  Cardiology   Procedures:  Pend echo   Antibiotics:  none (indicate start date, and stop date if known)  HPI/Subjective: Alert. No distress. Reports exertional dyspnea, chest tightness   Objective: Vitals:   05/30/17 1130 05/30/17 1206  BP: 136/73   Pulse: (!) 54 (!) 55  Resp: 14   Temp:    SpO2: 95% 100%    Intake/Output Summary (Last 24 hours) at 05/30/2017 1251 Last data filed at 05/30/2017 0615 Gross per 24 hour  Intake 200 ml  Output -  Net 200 ml   Filed Weights   05/30/17 0737  Weight: (!) 140.6 kg (310 lb)    Exam:   General:  No distress   Cardiovascular: s1,s2 rrr  Respiratory: no wheezing,. No crackles   Abdomen: soft. nt nd   Musculoskeletal: no no leg edema    Data Reviewed: Basic Metabolic Panel: Recent Labs  Lab 05/29/17 1529 05/30/17 0225  NA 136 141  K 3.3* 4.7  CL 107 107  CO2 21* 27  GLUCOSE 148* 93  BUN 18 27*  CREATININE 1.49* 1.77*  CALCIUM 9.3 9.2   Liver Function Tests: No  results for input(s): AST, ALT, ALKPHOS, BILITOT, PROT, ALBUMIN in the last 168 hours. No results for input(s): LIPASE, AMYLASE in the last 168 hours. No results for input(s): AMMONIA in the last 168 hours. CBC: Recent Labs  Lab 05/29/17 1529 05/30/17 0225  WBC 6.4 7.2  NEUTROABS  --  1.9  HGB 15.4 14.9  HCT 46.2 45.2  MCV 87.2 87.8  PLT 194 192   Cardiac  Enzymes: Recent Labs  Lab 05/30/17 0225 05/30/17 0457 05/30/17 0812  TROPONINI 0.06* 0.06* 0.05*   BNP (last 3 results) Recent Labs    05/29/17 1529  BNP 75.2    ProBNP (last 3 results) Recent Labs    05/14/17 0818  PROBNP 153.0*    CBG: No results for input(s): GLUCAP in the last 168 hours.  No results found for this or any previous visit (from the past 240 hour(s)).   Studies: Dg Chest 2 View  Result Date: 05/29/2017 CLINICAL DATA:  Chest pain shortness of breath. EXAM: CHEST  2 VIEW COMPARISON:  12/15/2015 FINDINGS: The lungs are clear without focal pneumonia, edema, pneumothorax or pleural effusion. Linear chronic atelectasis or scarring at the right base is stable. Cardiopericardial silhouette is at upper limits of normal for size. The visualized bony structures of the thorax are intact. IMPRESSION: Stable.  No acute findings. Electronically Signed   By: Misty Stanley M.D.   On: 05/29/2017 15:59   Ct Angio Chest Pe W And/or Wo Contrast  Result Date: 05/29/2017 CLINICAL DATA:  Shortness of breath and chest pain EXAM: CT ANGIOGRAPHY CHEST WITH CONTRAST TECHNIQUE: Multidetector CT imaging of the chest was performed using the standard protocol during bolus administration of intravenous contrast. Multiplanar CT image reconstructions and MIPs were obtained to evaluate the vascular anatomy. CONTRAST:  161mL ISOVUE-370 IOPAMIDOL (ISOVUE-370) INJECTION 76% COMPARISON:  Chest radiograph May 29, 2017 FINDINGS: Cardiovascular: There is no demonstrable pulmonary embolus. There is no appreciable thoracic aortic aneurysm. No dissection seen, although the contrast bolus is less than optimal for dissection assessment. Visualized great vessels appear unremarkable. There are foci of thoracic aortic calcification. There are foci of coronary artery calcification. There is no appreciable pericardial effusion or thickening. Mediastinum/Nodes: Visualized thyroid appears normal. There is a right  hilar lymph node measuring 1.4 x 1.1 cm. There is no other appreciable lymph node prominence. There is a small hiatal hernia. Lungs/Pleura: There is patchy bilateral lower lobe atelectatic change. There is also atelectatic change in the posterior aspect of the right upper lobe inferiorly. There is no frank edema or consolidation. There is no appreciable pleural effusion or pleural thickening. Upper Abdomen: There is reflux of contrast into the inferior vena cava and hepatic veins. Visualized upper abdominal structures otherwise appear unremarkable. Musculoskeletal: There are foci of degenerative change in the lower thoracic region. There are no blastic or lytic bone lesions. Review of the MIP images confirms the above findings. IMPRESSION: 1.  No demonstrable pulmonary embolus. 2. Areas of atelectatic change bilaterally. No frank edema or consolidation. 3. Single mildly enlarged right hilar lymph node. Suspect reactive etiology. 4. Aortic atherosclerosis. Foci of coronary artery calcification noted. 5. Reflux of contrast into the inferior vena cava and hepatic veins may be indicative of increased right heart pressure. Aortic Atherosclerosis (ICD10-I70.0). Electronically Signed   By: Lowella Grip III M.D.   On: 05/29/2017 21:58    Scheduled Meds: . aspirin  81 mg Oral Daily  . carvedilol  6.25 mg Oral BID WC  . cloNIDine  0.3 mg  Oral BID  . clopidogrel  75 mg Oral Daily  . enoxaparin (LOVENOX) injection  30 mg Subcutaneous QHS  . furosemide  40 mg Oral q1800  . furosemide  80 mg Oral Daily  . gabapentin  300 mg Oral TID  . isosorbide mononitrate  90 mg Oral Daily  . loratadine  10 mg Oral Daily   Continuous Infusions:  Principal Problem:   Chest pain Active Problems:   DYSPNEA   Tobacco abuse   DM2 (diabetes mellitus, type 2) (HCC)   CAD S/P multiple PCI   Essential hypertension   Hypokalemia    Time spent: >35 minutes     Kinnie Feil  [facility] Pager [phone]. If  7PM-7AM, please contact night-coverage at [email or web address], password [facility] 05/30/2017, 12:51 PM  LOS: 0 days

## 2017-05-31 ENCOUNTER — Observation Stay (HOSPITAL_BASED_OUTPATIENT_CLINIC_OR_DEPARTMENT_OTHER): Payer: Medicare HMO

## 2017-05-31 DIAGNOSIS — R079 Chest pain, unspecified: Secondary | ICD-10-CM

## 2017-05-31 DIAGNOSIS — Z87891 Personal history of nicotine dependence: Secondary | ICD-10-CM | POA: Diagnosis not present

## 2017-05-31 DIAGNOSIS — R0609 Other forms of dyspnea: Secondary | ICD-10-CM | POA: Diagnosis not present

## 2017-05-31 DIAGNOSIS — I10 Essential (primary) hypertension: Secondary | ICD-10-CM

## 2017-05-31 DIAGNOSIS — Z9861 Coronary angioplasty status: Secondary | ICD-10-CM

## 2017-05-31 DIAGNOSIS — I2510 Atherosclerotic heart disease of native coronary artery without angina pectoris: Secondary | ICD-10-CM | POA: Diagnosis not present

## 2017-05-31 DIAGNOSIS — I2721 Secondary pulmonary arterial hypertension: Secondary | ICD-10-CM | POA: Diagnosis not present

## 2017-05-31 DIAGNOSIS — G4733 Obstructive sleep apnea (adult) (pediatric): Secondary | ICD-10-CM

## 2017-05-31 DIAGNOSIS — E1159 Type 2 diabetes mellitus with other circulatory complications: Secondary | ICD-10-CM | POA: Diagnosis not present

## 2017-05-31 LAB — ECHOCARDIOGRAM COMPLETE
AVLVOTPG: 12 mmHg
Ao-asc: 35 cm
CHL CUP TV REG PEAK VELOCITY: 282 cm/s
EERAT: 12.27
EWDT: 306 ms
FS: 29 % (ref 28–44)
HEIGHTINCHES: 73 in
IV/PV OW: 0.97
LA ID, A-P, ES: 42 mm
LA vol A4C: 138 ml
LA vol index: 51.6 mL/m2
LA vol: 126 mL
LADIAMINDEX: 1.72 cm/m2
LEFT ATRIUM END SYS DIAM: 42 mm
LV E/e' medial: 12.27
LV SIMPSON'S DISK: 68
LV TDI E'LATERAL: 6.64
LV TDI E'MEDIAL: 6.31
LV dias vol index: 62 mL/m2
LV dias vol: 152 mL — AB (ref 62–150)
LV e' LATERAL: 6.64 cm/s
LV sys vol index: 20 mL/m2
LVEEAVG: 12.27
LVOT SV: 128 mL
LVOT VTI: 30.9 cm
LVOT area: 4.15 cm2
LVOT diameter: 23 mm
LVOT peak vel: 173 cm/s
LVSYSVOL: 49 mL (ref 21–61)
Lateral S' vel: 11 cm/s
MV Dec: 306
MV Peak grad: 3 mmHg
MVPKAVEL: 74.2 m/s
MVPKEVEL: 81.5 m/s
PW: 17.6 mm — AB (ref 0.6–1.1)
RV sys press: 47 mmHg
Stroke v: 103 ml
TAPSE: 23.1 mm
TR max vel: 282 cm/s
WEIGHTICAEL: 4400 [oz_av]

## 2017-05-31 LAB — BASIC METABOLIC PANEL
ANION GAP: 7 (ref 5–15)
BUN: 16 mg/dL (ref 6–20)
CALCIUM: 9.4 mg/dL (ref 8.9–10.3)
CO2: 24 mmol/L (ref 22–32)
Chloride: 108 mmol/L (ref 101–111)
Creatinine, Ser: 1.36 mg/dL — ABNORMAL HIGH (ref 0.61–1.24)
GFR, EST NON AFRICAN AMERICAN: 55 mL/min — AB (ref >60)
Glucose, Bld: 165 mg/dL — ABNORMAL HIGH (ref 65–99)
Potassium: 3.2 mmol/L — ABNORMAL LOW (ref 3.5–5.1)
Sodium: 139 mmol/L (ref 135–145)

## 2017-05-31 LAB — GLUCOSE, CAPILLARY
GLUCOSE-CAPILLARY: 112 mg/dL — AB (ref 65–99)
Glucose-Capillary: 76 mg/dL (ref 65–99)
Glucose-Capillary: 117 mg/dL — ABNORMAL HIGH (ref 65–99)

## 2017-05-31 MED ORDER — POTASSIUM CHLORIDE CRYS ER 20 MEQ PO TBCR
40.0000 meq | EXTENDED_RELEASE_TABLET | ORAL | Status: AC
  Administered 2017-05-31 (×2): 40 meq via ORAL
  Filled 2017-05-31 (×2): qty 2

## 2017-05-31 MED ORDER — CLONIDINE HCL 0.1 MG PO TABS: 0.1000 mg | ORAL_TABLET | Freq: Two times a day (BID) | ORAL | 0 refills | Status: DC

## 2017-05-31 MED ORDER — ENOXAPARIN SODIUM 40 MG/0.4ML ~~LOC~~ SOLN: 40.0000 mg | Freq: Every day | SUBCUTANEOUS | Status: DC

## 2017-05-31 NOTE — Discharge Summary (Addendum)
Physician Discharge Summary  [name] [name] GUY:[id number] DOB: 22-Oct-1955 DOA: 05/29/2017  PCP: No primary care provider on file.  Admit date: 05/29/2017 Discharge date: 05/31/2017  Admitted From: Home Disposition:  Home  Recommendations for Outpatient Follow-up:  1. Follow up with Cardiology 06/27/2017  2. Consider outpatient PFT  3. Improve compliance with CPAP   Discharge Condition: Stable CODE STATUS: Full  Diet recommendation: Heart healthy   Brief/Interim Summary: HPI by Dr. Fuller Plan: [name] [name] is a 61 y.o. male with medical history significant of CAD with multiple prior stents, ischemic cardiomyopathy, systolic CHF last EF 56-38% in 06/2015, HTN, HLD, GERD morbid obesity, and OSA with intermittent use of CPAP; who presents with complaints of shortness of breath over the last 2 days and centralized chest pressure today.  Reported feeling as though he was breathing through a straw.  At baseline the patient is not on oxygen.  Laying flat seems to make symptoms worse and made him have a dry cough.  The chest pain did not radiate.  He initially thought symptoms were secondary to fluid.  He does not regularly check his weight, but denies having any lower extremity swelling as he is previously had in the past.  Patient utilizes albuterol inhaler with only mild temporary relief.   He reports similar chest pressure symptoms with previous MI.  Reports last having a cardiac catheterization by Dr. Burt [name] some years ago.  En route with EMS patient received 324 mg of aspirin.  Patient denied having any symptoms of nausea, vomiting, diaphoresis, or lightheadedness.  ED Course: Upon admission into the emergency department patient was noted to afebrile, pulse anywhere from 46-64, respirations 13-22, blood pressure 122/80 150/89, O2 saturations 95-100% on room air.  Patient was never noted to be hypoxic, but reported improvement of breathing on being placed on 2 L nasal cannula oxygen.  Patient  notes that chest pain symptoms completely resolved following utilizing the restroom.  Interim: Patient was evaluated by cardiology. Troponin remained negative, EKG non-ischemic and unchanged from previous. They recommended echocardiogram (results below) and no change in cardiac therapy for now. On day of discharge, patient was doing well and feeling well. Denied any chest pain or shortness of breath. This is likely a pulmonary etiology; recommend PFT and compliance with CPAP.    Discharge Diagnoses:  Principal Problem:   Chest pain Active Problems:   DYSPNEA   Tobacco abuse   DM2 (diabetes mellitus, type 2) (HCC)   CAD S/P multiple PCI   OSA (obstructive sleep apnea)   Essential hypertension   CKD (chronic kidney disease), stage III (HCC)   Hypokalemia  Discharge Instructions  Discharge Instructions    (HEART FAILURE PATIENTS) Call MD:  Anytime you have any of the following symptoms: 1) 3 pound weight gain in 24 hours or 5 pounds in 1 week 2) shortness of breath, with or without a dry hacking cough 3) swelling in the hands, feet or stomach 4) if you have to sleep on extra pillows at night in order to breathe.   Complete by:  As directed    Call MD for:  difficulty breathing, headache or visual disturbances   Complete by:  As directed    Call MD for:  extreme fatigue   Complete by:  As directed    Call MD for:  hives   Complete by:  As directed    Call MD for:  persistant dizziness or light-headedness   Complete by:  As directed    Call MD  for:  persistant nausea and vomiting   Complete by:  As directed    Call MD for:  severe uncontrolled pain   Complete by:  As directed    Call MD for:  temperature >100.4   Complete by:  As directed    Diet - low sodium heart healthy   Complete by:  As directed    Discharge instructions   Complete by:  As directed    You were cared for by a hospitalist during your hospital stay. If you have any questions about your discharge medications or  the care you received while you were in the hospital after you are discharged, you can call the unit and asked to speak with the hospitalist on call if the hospitalist that took care of you is not available. Once you are discharged, your primary care physician will handle any further medical issues. Please note that NO REFILLS for any discharge medications will be authorized once you are discharged, as it is imperative that you return to your primary care physician (or establish a relationship with a primary care physician if you do not have one) for your aftercare needs so that they can reassess your need for medications and monitor your lab values.   Increase activity slowly   Complete by:  As directed      Allergies as of 05/31/2017      Reactions   Penicillins Other (See Comments)   Unknown childhood reaction      Medication List    TAKE these medications   albuterol 108 (90 Base) MCG/ACT inhaler Commonly known as:  PROVENTIL HFA;VENTOLIN HFA Inhale 2 puffs into the lungs every 6 (six) hours as needed for wheezing or shortness of breath.   aspirin 81 MG tablet Take 81 mg by mouth daily.   carvedilol 6.25 MG tablet Commonly known as:  COREG Take 1 tablet (6.25 mg total) by mouth 2 (two) times daily with a meal. *Please call and schedule an appointment*   cetirizine 10 MG tablet Commonly known as:  ZYRTEC Take 1 tablet (10 mg total) by mouth daily.   cloNIDine 0.1 MG tablet Commonly known as:  CATAPRES Take 1 tablet (0.1 mg total) by mouth 2 (two) times daily. What changed:    medication strength  how much to take   clopidogrel 75 MG tablet Commonly known as:  PLAVIX Take 1 tablet (75 mg total) by mouth daily. *Please call and schedule an appointment*   escitalopram 20 MG tablet Commonly known as:  LEXAPRO Take 1 tablet (20 mg total) by mouth daily.   furosemide 40 MG tablet Commonly known as:  LASIX SEE NOTES What changed:    how much to take  how to take  this  when to take this  additional instructions   gabapentin 300 MG capsule Commonly known as:  NEURONTIN TAKE 1 CAPSULE(300 MG) BY MOUTH three times daily   glipiZIDE 2.5 MG 24 hr tablet Commonly known as:  GLUCOTROL XL Take 1 tablet (2.5 mg total) by mouth daily with breakfast.   isosorbide mononitrate 30 MG 24 hr tablet Commonly known as:  IMDUR Take 3 tablets (90 mg total) by mouth daily.   lactulose 10 GM/15ML solution Commonly known as:  CHRONULAC TAKE 30 ML BY MOUTH TWICE DAILY AS NEEDED   losartan 100 MG tablet Commonly known as:  COZAAR TAKE 1 TABLET(100 MG) BY MOUTH DAILY   multivitamin with minerals tablet Take 1 tablet by mouth daily. CENTRUM SILVER MENS  50 PLUS   nitroGLYCERIN 0.4 MG SL tablet Commonly known as:  NITROSTAT PLACE 1 TABLET UNDER THE TONGUE EVERY 5 MINUTES AS NEEDED FOR CHEST PAIN   pantoprazole 40 MG tablet Commonly known as:  PROTONIX TAKE 1 TABLET(40 MG) BY MOUTH DAILY   potassium chloride 10 MEQ tablet Commonly known as:  K-DUR TAKE 2 TABLETS BY MOUTH EVERY MORNING AND 1 TABLET BY MOUTH EVERY EVENING What changed:    how much to take  when to take this  additional instructions   rosuvastatin 40 MG tablet Commonly known as:  CRESTOR Take 1 tablet (40 mg total) by mouth daily.   traMADol 50 MG tablet Commonly known as:  ULTRAM Take 1 tablet (50 mg total) by mouth every 6 (six) hours as needed.   triamcinolone 55 MCG/ACT Aero nasal inhaler Commonly known as:  NASACORT Place 2 sprays into the nose daily.      Follow-up Information    [place], [place], [place]. Go on 06/27/2017.   Specialty:  Cardiology Why:  @1 :30pm for hospital follow up Contact information: [address] [place] [address] [phone]          Allergies  Allergen Reactions  . Penicillins Other (See Comments)    Unknown childhood reaction    Consultations:  Cardiology    Procedures/Studies: Dg Chest 2 View  Result Date:  05/29/2017 CLINICAL DATA:  Chest pain shortness of breath. EXAM: CHEST  2 VIEW COMPARISON:  12/15/2015 FINDINGS: The lungs are clear without focal pneumonia, edema, pneumothorax or pleural effusion. Linear chronic atelectasis or scarring at the right base is stable. Cardiopericardial silhouette is at upper limits of normal for size. The visualized bony structures of the thorax are intact. IMPRESSION: Stable.  No acute findings. Electronically Signed   By: Misty Stanley M.D.   On: 05/29/2017 15:59   Ct Angio Chest Pe W And/or Wo Contrast  Result Date: 05/29/2017 CLINICAL DATA:  Shortness of breath and chest pain EXAM: CT ANGIOGRAPHY CHEST WITH CONTRAST TECHNIQUE: Multidetector CT imaging of the chest was performed using the standard protocol during bolus administration of intravenous contrast. Multiplanar CT image reconstructions and MIPs were obtained to evaluate the vascular anatomy. CONTRAST:  134mL ISOVUE-370 IOPAMIDOL (ISOVUE-370) INJECTION 76% COMPARISON:  Chest radiograph May 29, 2017 FINDINGS: Cardiovascular: There is no demonstrable pulmonary embolus. There is no appreciable thoracic aortic aneurysm. No dissection seen, although the contrast bolus is less than optimal for dissection assessment. Visualized great vessels appear unremarkable. There are foci of thoracic aortic calcification. There are foci of coronary artery calcification. There is no appreciable pericardial effusion or thickening. Mediastinum/Nodes: Visualized thyroid appears normal. There is a right hilar lymph node measuring 1.4 x 1.1 cm. There is no other appreciable lymph node prominence. There is a small hiatal hernia. Lungs/Pleura: There is patchy bilateral lower lobe atelectatic change. There is also atelectatic change in the posterior aspect of the right upper lobe inferiorly. There is no frank edema or consolidation. There is no appreciable pleural effusion or pleural thickening. Upper Abdomen: There is reflux of contrast  into the inferior vena cava and hepatic veins. Visualized upper abdominal structures otherwise appear unremarkable. Musculoskeletal: There are foci of degenerative change in the lower thoracic region. There are no blastic or lytic bone lesions. Review of the MIP images confirms the above findings. IMPRESSION: 1.  No demonstrable pulmonary embolus. 2. Areas of atelectatic change bilaterally. No frank edema or consolidation. 3. Single mildly enlarged right hilar lymph node. Suspect reactive  etiology. 4. Aortic atherosclerosis. Foci of coronary artery calcification noted. 5. Reflux of contrast into the inferior vena cava and hepatic veins may be indicative of increased right heart pressure. Aortic Atherosclerosis (ICD10-I70.0). Electronically Signed   By: Lowella Grip III M.D.   On: 05/29/2017 21:58    Echo Study Conclusions  - Left ventricle: The cavity size was normal. There was severe   concentric hypertrophy. Systolic function was normal. The   estimated ejection fraction was in the range of 55% to 60%. Wall   motion was normal; there were no regional wall motion   abnormalities. Doppler parameters are consistent with abnormal   left ventricular relaxation (grade 1 diastolic dysfunction). - Pulmonary arteries: Systolic pressure was mildly to moderately   increased. PA peak pressure: 47 mm Hg (S).     Discharge Exam: Vitals:   05/31/17 0915 05/31/17 1423  BP: (!) 148/84 (!) 147/84  Pulse:    Resp:    Temp:  98.4 F (36.9 C)  SpO2:  100%   Vitals:   05/30/17 2300 05/31/17 0500 05/31/17 0915 05/31/17 1423  BP:  (!) 151/94 (!) 148/84 (!) 147/84  Pulse: (!) 55 (!) 58    Resp: 20 20    Temp:  98.6 F (37 C)  98.4 F (36.9 C)  TempSrc:  Oral  Oral  SpO2: 97% 100%  100%  Weight:  124.7 kg (275 lb)    Height:        General: Pt is alert, awake, not in acute distress Cardiovascular: RRR, S1/S2 +, no rubs, no gallops Respiratory: CTA bilaterally, no wheezing, no  rhonchi Abdominal: Soft, NT, ND, bowel sounds + Extremities: no edema, no cyanosis    The results of significant diagnostics from this hospitalization (including imaging, microbiology, ancillary and laboratory) are listed below for reference.     Microbiology: No results found for this or any previous visit (from the past 240 hour(s)).   Labs: BNP (last 3 results) Recent Labs    05/29/17 1529  BNP 41.3   Basic Metabolic Panel: Recent Labs  Lab 05/29/17 1529 05/30/17 0225 05/31/17 1017  NA 136 141 139  K 3.3* 4.7 3.2*  CL 107 107 108  CO2 21* 27 24  GLUCOSE 148* 93 165*  BUN 18 27* 16  CREATININE 1.49* 1.77* 1.36*  CALCIUM 9.3 9.2 9.4   Liver Function Tests: No results for input(s): AST, ALT, ALKPHOS, BILITOT, PROT, ALBUMIN in the last 168 hours. No results for input(s): LIPASE, AMYLASE in the last 168 hours. No results for input(s): AMMONIA in the last 168 hours. CBC: Recent Labs  Lab 05/29/17 1529 05/30/17 0225  WBC 6.4 7.2  NEUTROABS  --  1.9  HGB 15.4 14.9  HCT 46.2 45.2  MCV 87.2 87.8  PLT 194 192   Cardiac Enzymes: Recent Labs  Lab 05/30/17 0225 05/30/17 0457 05/30/17 0812  TROPONINI 0.06* 0.06* 0.05*   BNP: Invalid input(s): POCBNP CBG: Recent Labs  Lab 05/30/17 1800 05/30/17 2056 05/31/17 0729 05/31/17 1114  GLUCAP 114* 95 76 117*   D-Dimer Recent Labs    05/29/17 2018  DDIMER 0.71*   Hgb A1c No results for input(s): HGBA1C in the last 72 hours. Lipid Profile Recent Labs    05/30/17 0225  CHOL 123  HDL 44  LDLCALC 68  TRIG 57  CHOLHDL 2.8   Thyroid function studies No results for input(s): TSH, T4TOTAL, T3FREE, THYROIDAB in the last 72 hours.  Invalid input(s): FREET3 Anemia work up No  results for input(s): VITAMINB12, FOLATE, FERRITIN, TIBC, IRON, RETICCTPCT in the last 72 hours. Urinalysis    Component Value Date/Time   COLORURINE YELLOW 05/14/2017 0818   APPEARANCEUR CLEAR 05/14/2017 0818   LABSPEC >=1.030 (A)  05/14/2017 0818   PHURINE 5.5 05/14/2017 0818   GLUCOSEU NEGATIVE 05/14/2017 0818   HGBUR NEGATIVE 05/14/2017 0818   HGBUR negative 08/23/2009 0000   BILIRUBINUR NEGATIVE 05/14/2017 0818   KETONESUR NEGATIVE 05/14/2017 0818   PROTEINUR 100 (A) 02/11/2015 0701   UROBILINOGEN 0.2 05/14/2017 0818   NITRITE NEGATIVE 05/14/2017 0818   LEUKOCYTESUR NEGATIVE 05/14/2017 0818   Sepsis Labs Invalid input(s): PROCALCITONIN,  WBC,  LACTICIDVEN Microbiology No results found for this or any previous visit (from the past 240 hour(s)).   Time coordinating discharge: 40 minutes  SIGNED:  Dessa Phi, DO [facility] Pager [phone]  If 7PM-7AM, please contact night-coverage [email or web address] Password [facility] 05/31/2017, 3:56 PM

## 2017-05-31 NOTE — Progress Notes (Signed)
Progress Note  Patient Name: [name] [name] Date of Encounter: 05/31/2017  Primary Cardiologist: Sherren Mocha, MD   Subjective   Feeling well. No chest pain, sob or palpitations.   Inpatient Medications    Scheduled Meds: . aspirin  81 mg Oral Daily  . carvedilol  6.25 mg Oral BID WC  . cloNIDine  0.1 mg Oral BID  . clopidogrel  75 mg Oral Daily  . enoxaparin (LOVENOX) injection  30 mg Subcutaneous QHS  . furosemide  40 mg Oral q1800  . furosemide  80 mg Oral Daily  . gabapentin  300 mg Oral TID  . insulin aspart  0-15 Units Subcutaneous TID WC  . isosorbide mononitrate  90 mg Oral Daily  . loratadine  10 mg Oral Daily   Continuous Infusions:  PRN Meds: acetaminophen, gi cocktail, ipratropium-albuterol, morphine injection, nitroGLYCERIN, ondansetron (ZOFRAN) IV   Vital Signs    Vitals:   05/30/17 1752 05/30/17 2016 05/30/17 2300 05/31/17 0500  BP: 127/90 123/78  (!) 151/94  Pulse:  (!) 46 (!) 55 (!) 58  Resp: 17  20 20   Temp:  97.9 F (36.6 C)  98.6 F (37 C)  TempSrc:  Oral  Oral  SpO2:  97% 97% 100%  Weight:    275 lb (124.7 kg)  Height:        Intake/Output Summary (Last 24 hours) at 05/31/2017 0903 Last data filed at 05/31/2017 0730 Gross per 24 hour  Intake 540 ml  Output 700 ml  Net -160 ml   Filed Weights   05/30/17 0737 05/31/17 0500  Weight: (!) 310 lb (140.6 kg) 275 lb (124.7 kg)    Telemetry    SR with PVCs - Personally Reviewed  ECG    N/A  Physical Exam   GEN: No acute distress.   Neck: No JVD Cardiac: RRR, no murmurs, rubs, or gallops.  Respiratory: Clear to auscultation bilaterally. GI: Soft, nontender, non-distended  MS: No edema; No deformity. Neuro:  Nonfocal  Psych: Normal affect   Labs    Chemistry Recent Labs  Lab 05/29/17 1529 05/30/17 0225  NA 136 141  K 3.3* 4.7  CL 107 107  CO2 21* 27  GLUCOSE 148* 93  BUN 18 27*  CREATININE 1.49* 1.77*  CALCIUM 9.3 9.2  GFRNONAA 49* 40*  GFRAA 57* 46*    ANIONGAP 8 7     Hematology Recent Labs  Lab 05/29/17 1529 05/30/17 0225  WBC 6.4 7.2  RBC 5.30 5.15  HGB 15.4 14.9  HCT 46.2 45.2  MCV 87.2 87.8  MCH 29.1 28.9  MCHC 33.3 33.0  RDW 15.8* 15.8*  PLT 194 192    Cardiac Enzymes Recent Labs  Lab 05/30/17 0225 05/30/17 0457 05/30/17 0812  TROPONINI 0.06* 0.06* 0.05*    Recent Labs  Lab 05/29/17 1534 05/29/17 2004  TROPIPOC 0.05 0.04     BNP Recent Labs  Lab 05/29/17 1529  BNP 75.2     DDimer  Recent Labs  Lab 05/29/17 2018  DDIMER 0.71*     Radiology    Dg Chest 2 View  Result Date: 05/29/2017 CLINICAL DATA:  Chest pain shortness of breath. EXAM: CHEST  2 VIEW COMPARISON:  12/15/2015 FINDINGS: The lungs are clear without focal pneumonia, edema, pneumothorax or pleural effusion. Linear chronic atelectasis or scarring at the right base is stable. Cardiopericardial silhouette is at upper limits of normal for size. The visualized bony structures of the thorax are intact. IMPRESSION: Stable.  No acute  findings. Electronically Signed   By: Misty Stanley M.D.   On: 05/29/2017 15:59   Ct Angio Chest Pe W And/or Wo Contrast  Result Date: 05/29/2017 CLINICAL DATA:  Shortness of breath and chest pain EXAM: CT ANGIOGRAPHY CHEST WITH CONTRAST TECHNIQUE: Multidetector CT imaging of the chest was performed using the standard protocol during bolus administration of intravenous contrast. Multiplanar CT image reconstructions and MIPs were obtained to evaluate the vascular anatomy. CONTRAST:  143mL ISOVUE-370 IOPAMIDOL (ISOVUE-370) INJECTION 76% COMPARISON:  Chest radiograph May 29, 2017 FINDINGS: Cardiovascular: There is no demonstrable pulmonary embolus. There is no appreciable thoracic aortic aneurysm. No dissection seen, although the contrast bolus is less than optimal for dissection assessment. Visualized great vessels appear unremarkable. There are foci of thoracic aortic calcification. There are foci of coronary  artery calcification. There is no appreciable pericardial effusion or thickening. Mediastinum/Nodes: Visualized thyroid appears normal. There is a right hilar lymph node measuring 1.4 x 1.1 cm. There is no other appreciable lymph node prominence. There is a small hiatal hernia. Lungs/Pleura: There is patchy bilateral lower lobe atelectatic change. There is also atelectatic change in the posterior aspect of the right upper lobe inferiorly. There is no frank edema or consolidation. There is no appreciable pleural effusion or pleural thickening. Upper Abdomen: There is reflux of contrast into the inferior vena cava and hepatic veins. Visualized upper abdominal structures otherwise appear unremarkable. Musculoskeletal: There are foci of degenerative change in the lower thoracic region. There are no blastic or lytic bone lesions. Review of the MIP images confirms the above findings. IMPRESSION: 1.  No demonstrable pulmonary embolus. 2. Areas of atelectatic change bilaterally. No frank edema or consolidation. 3. Single mildly enlarged right hilar lymph node. Suspect reactive etiology. 4. Aortic atherosclerosis. Foci of coronary artery calcification noted. 5. Reflux of contrast into the inferior vena cava and hepatic veins may be indicative of increased right heart pressure. Aortic Atherosclerosis (ICD10-I70.0). Electronically Signed   By: Lowella Grip III M.D.   On: 05/29/2017 21:58    Cardiac Studies   Pending echo  Patient Profile     61 y.o. male  with a hx of CAD w multiple stents, ischemic cardiomyopathy, mild systolic CHF, HTN, DM, and  HLD admitted for chest pain and shortness of breath.   Assessment & Plan    1. Chest pain with hx of CAD s/p multiple stent - troponin in not in ACE pattern. EKG without acute changes. No further work up.  - Continue current therapy.   2. Dyspnea - BNP 75. CXR clear. Euvolemic on exam. Pending echo to differentiate pulmonary vs cardiac etiology.  - ? Increased  R heart pressure by CT angio of chest.  Discharge later today after echo.   For questions or updates, please contact [place] Please consult [email or web address] for contact info under Cardiology/STEMI.      Signed, Leanor Kail, PA  05/31/2017, 9:03 AM    I have seen and examined the patient along with Leanor Kail, PA .  I have reviewed the chart, notes and new data.  I agree with PA/NP's note.  Key new complaints: feels better Key examination changes: no physical findings to suggest hyperovolemia Key new findings / data: Echo confirms presence of LVH and diastolic dysfunction, but there is no evidence of elevated filling pressures.   Mild to moderate pulmonary hypertension is seen, likely related to chronic lung disease and  obesity-sleep apnea.  Data points to a pulmonary cause for his episode of dyspnea.  No plan for additional cardiac evaluation or treatment at this time.  PLAN: Data points to a pulmonary cause for his episode of dyspnea.  No plan for additional cardiac evaluation or treatment at this time. Improve compliance with CPAP.  Sanda Klein, MD, [place] [phone] 05/31/2017, 3:49 PM

## 2017-05-31 NOTE — Progress Notes (Signed)
  Echocardiogram 2D Echocardiogram has been performed.  [name] [name] 05/31/2017, 2:22 PM

## 2017-05-31 NOTE — Progress Notes (Signed)
Discharge instructions reviewed with pt. All questions answered and pt expresses no concerns at this time. No change since previous assessment. PIV removed. Pt states he is visiting an inpatient before leaving the hospital. Pt discharged and escorted to [facility] by NT.

## 2017-06-10 ENCOUNTER — Encounter: Payer: Self-pay | Admitting: Internal Medicine

## 2017-06-12 ENCOUNTER — Other Ambulatory Visit: Payer: Self-pay | Admitting: Internal Medicine

## 2017-06-18 DIAGNOSIS — G4733 Obstructive sleep apnea (adult) (pediatric): Secondary | ICD-10-CM | POA: Diagnosis not present

## 2017-06-19 ENCOUNTER — Other Ambulatory Visit: Payer: Self-pay | Admitting: Cardiovascular Disease

## 2017-06-27 ENCOUNTER — Encounter: Payer: Self-pay | Admitting: Physician Assistant

## 2017-06-27 ENCOUNTER — Ambulatory Visit (INDEPENDENT_AMBULATORY_CARE_PROVIDER_SITE_OTHER): Payer: Medicare HMO | Admitting: Physician Assistant

## 2017-06-27 VITALS — BP 116/82 | HR 56 | Resp 16 | Ht 73.0 in | Wt 305.8 lb

## 2017-06-27 DIAGNOSIS — G4733 Obstructive sleep apnea (adult) (pediatric): Secondary | ICD-10-CM | POA: Diagnosis not present

## 2017-06-27 DIAGNOSIS — I5042 Chronic combined systolic (congestive) and diastolic (congestive) heart failure: Secondary | ICD-10-CM

## 2017-06-27 DIAGNOSIS — I10 Essential (primary) hypertension: Secondary | ICD-10-CM | POA: Diagnosis not present

## 2017-06-27 DIAGNOSIS — Z9989 Dependence on other enabling machines and devices: Secondary | ICD-10-CM | POA: Diagnosis not present

## 2017-06-27 DIAGNOSIS — I2510 Atherosclerotic heart disease of native coronary artery without angina pectoris: Secondary | ICD-10-CM | POA: Diagnosis not present

## 2017-06-27 DIAGNOSIS — E782 Mixed hyperlipidemia: Secondary | ICD-10-CM | POA: Diagnosis not present

## 2017-06-27 NOTE — Progress Notes (Signed)
Cardiology Office Note    Date:  06/27/2017   ID:  [name] [name], DOB Sep 07, 1955, MRN [id number]  PCP:  Biagio Borg, MD  Cardiologist:  Dr. [name] Sleep study: Dr. Radford Pax  Chief Complaint: Hospital follow up   History of Present Illness:   [name] [name] is a 61 y.o. male  hx of CAD w multiple stents, ischemic cardiomyopathy, mild systolic CHF, HTN, DM, HLD and OSA  with intermittent compliance with CPAP presents for hospital follow up.   Last seen by Dr. Radford Pax 08/2015.  Admitted 05/2017 for SOB/DOE. Felt possibly due to asthma vs undiagnosed COPD component. Reported improvement of breathing on being placed on 2 L nasal cannula oxygen. Recommend PFT and compliance with CPAP. Echo showed normal LVEF with grade 1 DD. mild elevated troponin not in ACE pattern. EKG without acute changes. No further work up.   Here today for follow up.  His sleep apnea has been resolved since compliant with CPAP.  Denies chest pain, orthopnea, PND, lower extremity edema, syncope, melena or blood in the stool or urine.  Compliant with low-sodium diet.  He used to weigh less than 300lb however gained weight recently.  Weight of 305 pounds today.  He is planning to exercise regularly.  Past Medical History:  Diagnosis Date  . CAD (coronary artery disease)    a. s/p PCI/BMS pRCA 2002; b. PCI pRCA & DES p/m RCA 2006; c. PCI/DES OM4 2007; d. PCI/CBA to RCA for ISR 10/2006; e.08/2012 STEMI/Cath/PCI:  LAD 95% >> PCI: 3.0x20 Promus Prem DES  //  f. NSTEMI 10/15 >> LHC: mLAD stent ok, dLAD 70, OM1 CTO, OM2 stent ok, dOM2 90, OM3 30-40, dLCx 90, p-mRCA stent ok, mRCA stent ok w/ 60-70 ISR, EF 50% >> med Rx  . Chronic combined systolic and diastolic CHF (congestive heart failure) ([place])    a. Myoview 1/17: EF 35%  //  b. Echo 1/17 EF 45-50%  . Depression   . Diabetes mellitus ([place])    a. A1c 8.8 08/2012->Metformin initiated. => b. A1c (9/14): 6.6  . GERD (gastroesophageal reflux disease)   . History of nuclear stress  test    a. Myoview 1/17: EF 35%, fixed inferior lateral defect consistent with infarct, no ischemia, intermediate risk  . History of pneumonia   . HTN (hypertension)   . Hyperlipidemia   . Ischemic cardiomyopathy    a. EF 40%; improved to normal;  b. 08/2012 Echo: EF 50-55%, mod LVH.//  c. Echo 9/16: Inf HK, mild LVH, EF 55%, mild LAE, normal RVF, mild RAE, PASP 35 mmHg  //  d. Echo 1/17: EF 45-50%, inferior HK, mild BAE, PASP 33 mmHg  . NSTEMI (non-ST elevated myocardial infarction) ([place])   . Obesity   . OSA (obstructive sleep apnea)    Does not use CPAP as of 05/2011  . Tobacco abuse     Past Surgical History:  Procedure Laterality Date  . CORONARY ANGIOPLASTY WITH STENT PLACEMENT    . CORONARY STENT PLACEMENT  2009  . LEFT HEART CATHETERIZATION WITH CORONARY ANGIOGRAM N/A 08/31/2012   Procedure: LEFT HEART CATHETERIZATION WITH CORONARY ANGIOGRAM;  Surgeon: Burnell Blanks, MD;  Location: [facility] CATH LAB;  Service: Cardiovascular;  Laterality: N/A;  . LEFT HEART CATHETERIZATION WITH CORONARY ANGIOGRAM N/A 11/16/2013   Procedure: LEFT HEART CATHETERIZATION WITH CORONARY ANGIOGRAM;  Surgeon: Blane Ohara, MD;  Location: [facility] CATH LAB;  Service: Cardiovascular;  Laterality: N/A;  . LEFT HEART CATHETERIZATION WITH CORONARY ANGIOGRAM N/A 03/15/2014  Procedure: LEFT HEART CATHETERIZATION WITH CORONARY ANGIOGRAM;  Surgeon: Peter M Martinique, MD;  Location: [facility] CATH LAB;  Service: Cardiovascular;  Laterality: N/A;  . PERCUTANEOUS CORONARY STENT INTERVENTION (PCI-S) Right 08/31/2012   Procedure: PERCUTANEOUS CORONARY STENT INTERVENTION (PCI-S);  Surgeon: Burnell Blanks, MD;  Location: [facility] CATH LAB;  Service: Cardiovascular;  Laterality: Right;  . PERCUTANEOUS CORONARY STENT INTERVENTION (PCI-S)  11/16/2013   Procedure: PERCUTANEOUS CORONARY STENT INTERVENTION (PCI-S);  Surgeon: Blane Ohara, MD;  Location: [facility] CATH LAB;  Service: Cardiovascular;;    Current Medications:  Prior to  Admission medications   Medication Sig Start Date End Date Taking? Authorizing Provider  albuterol (PROVENTIL HFA;VENTOLIN HFA) 108 (90 Base) MCG/ACT inhaler Inhale 2 puffs into the lungs every 6 (six) hours as needed for wheezing or shortness of breath. 12/18/16  Yes Biagio Borg, MD  aspirin 81 MG tablet Take 81 mg by mouth daily.   Yes [provider]  carvedilol (COREG) 6.25 MG tablet Take 1 tablet (6.25 mg total) by mouth 2 (two) times daily with a meal. *Please call and schedule an appointment* 05/09/17  Yes Biagio Borg, MD  cetirizine (ZYRTEC) 10 MG tablet Take 1 tablet (10 mg total) by mouth daily. 12/18/16 12/18/17 Yes Biagio Borg, MD  cloNIDine (CATAPRES) 0.1 MG tablet Take 1 tablet (0.1 mg total) by mouth 2 (two) times daily. 05/31/17  Yes Dessa Phi, DO  clopidogrel (PLAVIX) 75 MG tablet Take 1 tablet (75 mg total) by mouth daily. *Please call and schedule an appointment* 05/09/17  Yes Biagio Borg, MD  escitalopram (LEXAPRO) 20 MG tablet Take 1 tablet (20 mg total) by mouth daily. 05/09/17  Yes Biagio Borg, MD  furosemide (LASIX) 40 MG tablet SEE NOTES Patient taking differently: Take 40-80 mg by mouth 2 (two) times daily. Take 80 mg in the morning , take 40 mg in the evening  Take an extra 40 mg tablet as needed for weight for gain 3 lbs or more 05/09/17  Yes Biagio Borg, MD  gabapentin (NEURONTIN) 300 MG capsule TAKE 1 CAPSULE BY MOUTH THREE TIMES DAILY 06/12/17  Yes Biagio Borg, MD  glipiZIDE (GLUCOTROL XL) 2.5 MG 24 hr tablet Take 1 tablet (2.5 mg total) by mouth daily with breakfast. 05/09/17  Yes Biagio Borg, MD  isosorbide mononitrate (IMDUR) 30 MG 24 hr tablet Take 3 tablets (90 mg total) by mouth daily. 05/09/17  Yes Biagio Borg, MD  lactulose [facility]) 10 GM/15ML solution TAKE 30 ML BY MOUTH TWICE DAILY AS NEEDED 12/29/15  Yes Biagio Borg, MD  losartan (COZAAR) 100 MG tablet TAKE 1 TABLET(100 MG) BY MOUTH DAILY 05/09/17  Yes Biagio Borg, MD    Multiple Vitamins-Minerals (MULTIVITAMIN WITH MINERALS) tablet Take 1 tablet by mouth daily. CENTRUM SILVER MENS  50 PLUS   Yes [provider]  nitroGLYCERIN (NITROSTAT) 0.4 MG SL tablet PLACE 1 TABLET UNDER THE TONGUE EVERY 5 MINUTES AS NEEDED FOR CHEST PAIN 05/09/17  Yes Biagio Borg, MD  pantoprazole (PROTONIX) 40 MG tablet TAKE 1 TABLET(40 MG) BY MOUTH DAILY 05/09/17  Yes Biagio Borg, MD  potassium chloride (K-DUR) 10 MEQ tablet TAKE 2 TABLETS BY MOUTH EVERY MORNING AND 1 TABLET BY MOUTH EVERY EVENING Patient taking differently: 20 mEq 2 (two) times daily. TAKE 2 TABLETS BY MOUTH EVERY MORNING AND 1 TABLET BY MOUTH EVERY EVENING 05/09/17  Yes Biagio Borg, MD  rosuvastatin (CRESTOR) 40 MG tablet Take 1 tablet (  40 mg total) by mouth daily. 05/09/17  Yes Biagio Borg, MD  traMADol (ULTRAM) 50 MG tablet Take 1 tablet (50 mg total) by mouth every 6 (six) hours as needed. 05/09/17  Yes Biagio Borg, MD  triamcinolone (NASACORT) 55 MCG/ACT AERO nasal inhaler Place 2 sprays into the nose daily. 05/09/17  Yes Biagio Borg, MD    Allergies:   Penicillins   Social History   Socioeconomic History  . Marital status: Single    Spouse name: None  . Number of children: 0  . Years of education: None  . Highest education level: None  Social Needs  . Financial resource strain: None  . Food insecurity - worry: None  . Food insecurity - inability: None  . Transportation needs - medical: None  . Transportation needs - non-medical: None  Occupational History  . Occupation: retired  Tobacco Use  . Smoking status: Former Smoker    Packs/day: 0.50    Years: 30.00    Pack years: 15.00    Types: Cigarettes  . Smokeless tobacco: Never Used  . Tobacco comment: trying to cut down  Substance and Sexual Activity  . Alcohol use: No  . Drug use: No    Comment: remote marijuana use  . Sexual activity: Not Currently  Other Topics Concern  . None  Social History Narrative   Lives alone.   He has no children.   He retired early due to health problems.           Family History:  The patient's family history includes Cancer in his mother; Coronary artery disease in his unknown relative; Depression in his mother; Hypertension in his mother; Other in his father.   ROS:   Please see the history of present illness.    ROS All other systems reviewed and are negative.   PHYSICAL EXAM:   VS:  BP 116/82   Pulse (!) 56   Resp 16   Ht 6\' 1"  (1.854 m)   Wt (!) 305 lb 12.8 oz (138.7 kg)   SpO2 96%   BMI 40.35 kg/m    GEN: Well nourished, well developed, in no acute distress  HEENT: normal  Neck: no JVD, carotid bruits, or masses Cardiac: RRR; no murmurs, rubs, or gallops,no edema  Respiratory:  clear to auscultation bilaterally, normal work of breathing GI: soft, nontender, nondistended, + BS MS: no deformity or atrophy  Skin: warm and dry, no rash Neuro:  Alert and Oriented x 3, Strength and sensation are intact Psych: euthymic mood, full affect  Wt Readings from Last 3 Encounters:  06/27/17 (!) 305 lb 12.8 oz (138.7 kg)  05/31/17 275 lb (124.7 kg)  05/09/17 300 lb (136.1 kg)      Studies/Labs Reviewed:   EKG:  EKG is not ordered today.   Recent Labs: 05/14/2017: ALT 12; Pro B Natriuretic peptide (BNP) 153.0; TSH 1.67 05/29/2017: B Natriuretic Peptide 75.2 05/30/2017: Hemoglobin 14.9; Platelets 192 05/31/2017: BUN 16; Creatinine, Ser 1.36; Potassium 3.2; Sodium 139   Lipid Panel    Component Value Date/Time   CHOL 123 05/30/2017 0225   TRIG 57 05/30/2017 0225   HDL 44 05/30/2017 0225   CHOLHDL 2.8 05/30/2017 0225   VLDL 11 05/30/2017 0225   LDLCALC 68 05/30/2017 0225    Additional studies/ records that were reviewed today include:   Echocardiogram: 05/31/17 Study Conclusions  - Left ventricle: The cavity size was normal. There was severe   concentric hypertrophy. Systolic function was normal.  The   estimated ejection fraction was in the range of  55% to 60%. Wall   motion was normal; there were no regional wall motion   abnormalities. Doppler parameters are consistent with abnormal   left ventricular relaxation (grade 1 diastolic dysfunction). - Pulmonary arteries: Systolic pressure was mildly to moderately   increased. PA peak pressure: 47 mm Hg (S).  Stress Test 06/2015  Nuclear stress EF: 35%.  There was no ST segment deviation noted during stress.  This is an intermediate risk study.  Findings consistent with prior myocardial infarction.  The left ventricular ejection fraction is moderately decreased (30-44%).  Intermediate risk stress nuclear study with a large, severe, predominantly fixed inferior lateral defect consistent with prior infarct; no significant ischemia; EF 35 but visually appears better; akinesis of the basal and mid inferior lateral wall; mild LVE; suggest echo to better assess LV function. Study intermediate risk due to reduced LV function      ASSESSMENT & PLAN:    1. OSA - Now compliant. His dyspnea has been resolved.  Follow-up with Dr. Radford Pax.  2.  CAD status post multiple stent -No angina.  Continue Plavix, Imdur, Coreg and statin.  3.  Chronic diastolic heart failure Euvolemic.  Continue current dose of diuretics.  Continue Coreg and losartan.  4.  Hypertension -Blood pressure stable on current regimen.   5. Hyperlipidemia - Continue statin. 05/30/2017: Cholesterol 123; HDL 44; LDL Cholesterol 68; Triglycerides 57; VLDL 11    Medication Adjustments/Labs and Tests Ordered: Current medicines are reviewed at length with the patient today.  Concerns regarding medicines are outlined above.  Medication changes, Labs and Tests ordered today are listed in the Patient Instructions below. Patient Instructions  Medication Instructions:   Your physician recommends that you continue on your current medications as directed. Please refer to the Current Medication list given to you today.   If  you need a refill on your cardiac medications before your next appointment, please call your pharmacy.  Labwork: NONE ORDERED  TODAY    Testing/Procedures:  NONE ORDERED  TODAY    Follow-Up:  IN ONE TO TWO MONTHS WITH DR TURNER FOR CPAP     Your physician wants you to follow-up in:  IN  6  MONTHS WITH DR [name]  You will receive a reminder letter in the mail two months in advance. If you don't receive a letter, please call our office to schedule the follow-up appointment.      Any Other Special Instructions Will Be Listed Below (If Applicable).                                                                                                                                                      Jarrett Soho, PA  06/27/2017 1:51 PM    [facility]  [address], [place], [place]  [address] Phone: [phone]; Fax: [phone]

## 2017-06-27 NOTE — Patient Instructions (Addendum)
Medication Instructions:   Your physician recommends that you continue on your current medications as directed. Please refer to the Current Medication list given to you today.   If you need a refill on your cardiac medications before your next appointment, please call your pharmacy.  Labwork: NONE ORDERED  TODAY    Testing/Procedures:  NONE ORDERED  TODAY    Follow-Up:  IN ONE TO TWO MONTHS WITH DR TURNER FOR CPAP     Your physician wants you to follow-up in:  IN  6  MONTHS WITH DR [name]  You will receive a reminder letter in the mail two months in advance. If you don't receive a letter, please call our office to schedule the follow-up appointment.      Any Other Special Instructions Will Be Listed Below (If Applicable).

## 2017-07-07 ENCOUNTER — Other Ambulatory Visit: Payer: Self-pay | Admitting: Internal Medicine

## 2017-07-08 IMAGING — MR MR LUMBAR SPINE W/O CM
4 of 5 series · 25 of 48 positions shown · non-contrast
Comparison: Radiographs 02/14/2008

CLINICAL DATA: Two month history of low back and left hip and
buttock pain.

EXAM:
MRI LUMBAR SPINE WITHOUT CONTRAST
TECHNIQUE: Multiplanar, multisequence MR imaging of the lumbar spine was
performed. No intravenous contrast was administered.

[Series 3: T2 · sagittal · 4.0mm · 0.55mm/px · 6 of 14 slices shown (1 of 2)]
[im 1/14]
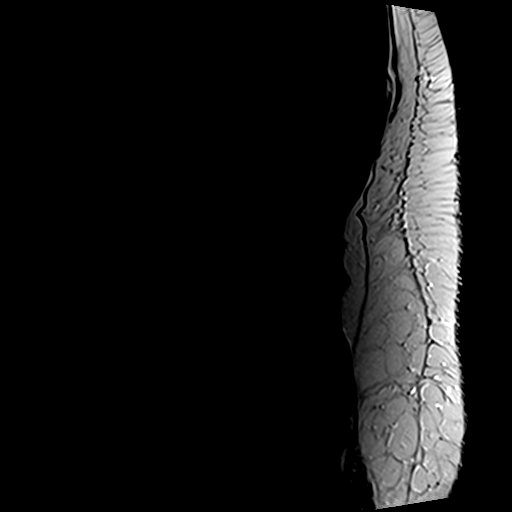
[im 3/14]
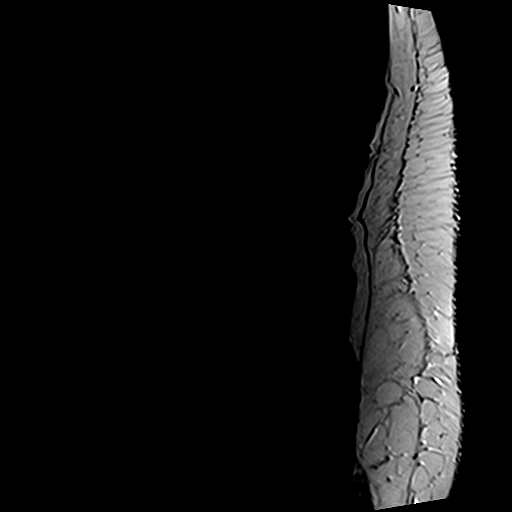
[im 6/14]
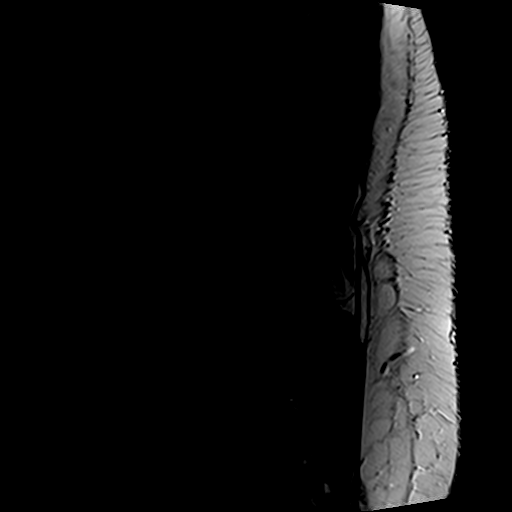
[im 8/14]
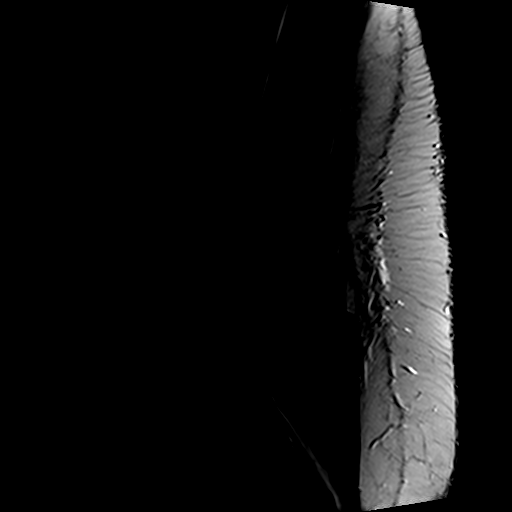
[im 11/14]
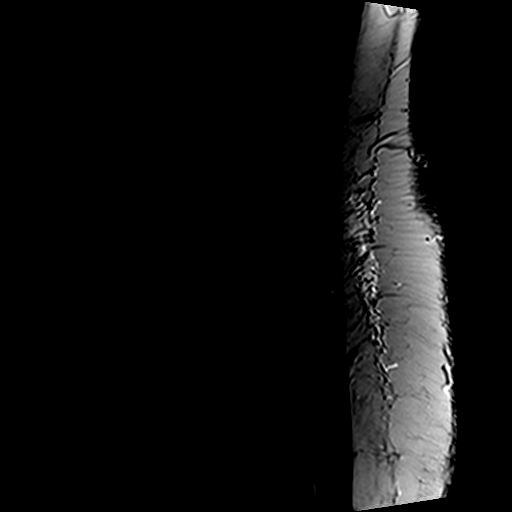
[im 14/14]
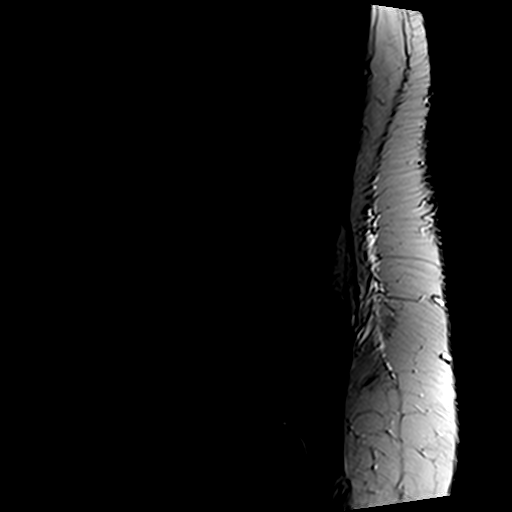

[Series 4: T1 · sagittal · 4.0mm · 0.55mm/px · 6 of 14 slices shown (1 of 2)]
[im 1/14]
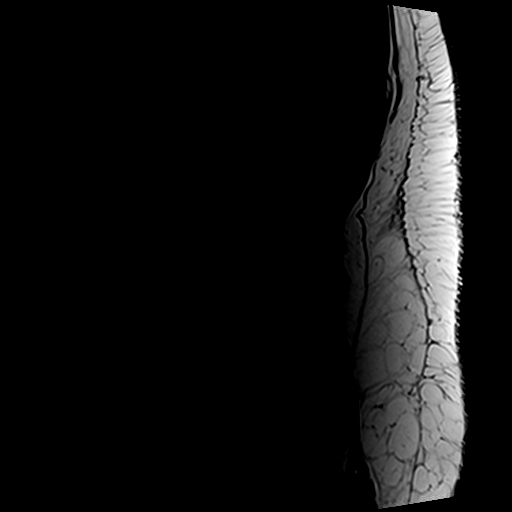
[im 3/14]
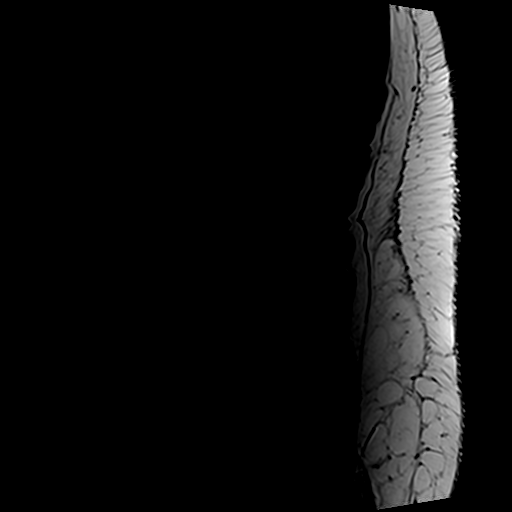
[im 6/14]
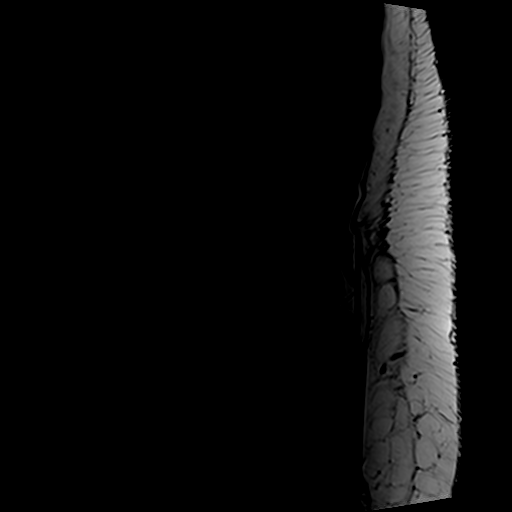
[im 8/14]
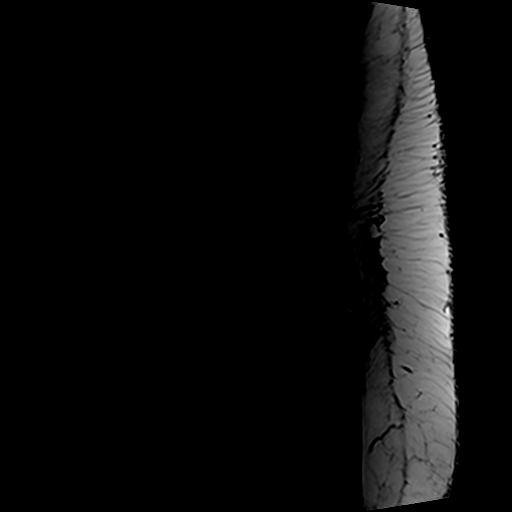
[im 11/14]
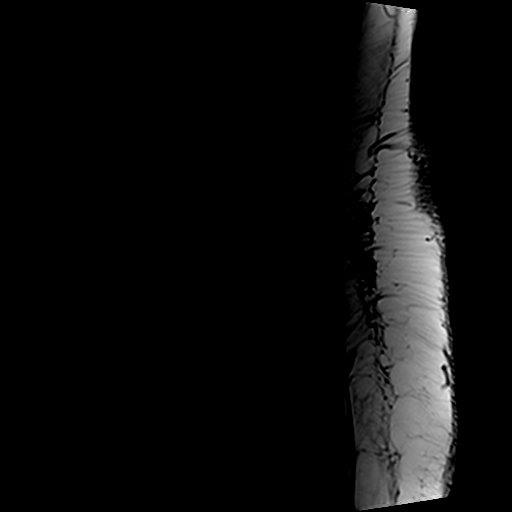
[im 14/14]
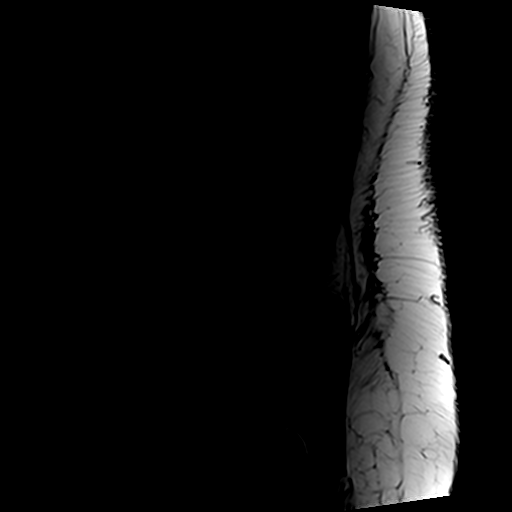

[Series 6: T2 · axial · 4.0mm · 0.78mm/px · z∈[-40,+163]mm · 9 of 37 slices shown (2 of 2)]
[im 1/37]
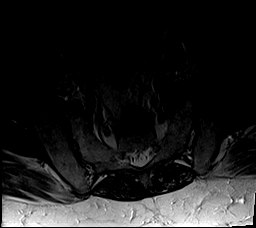
[im 6/37]
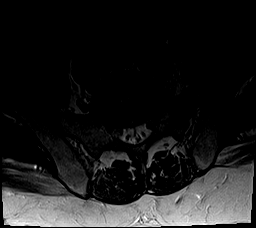
[im 11/37]
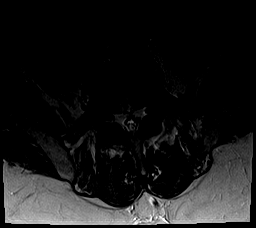
[im 16/37]
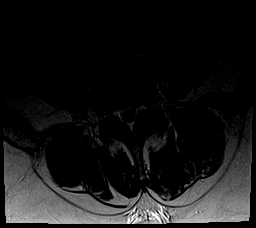
[im 19/37]
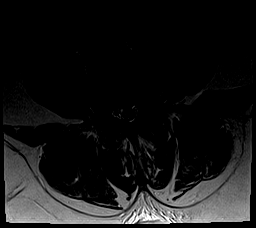
[im 21/37]
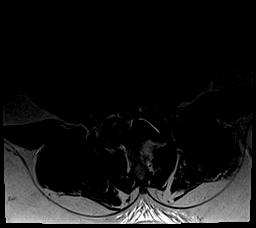
[im 26/37]
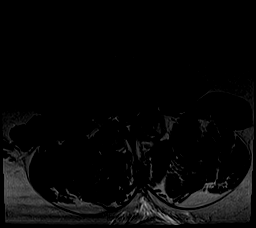
[im 31/37]
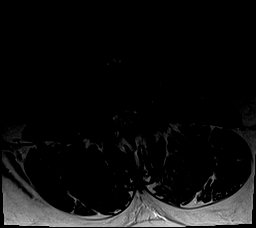
[im 37/37]
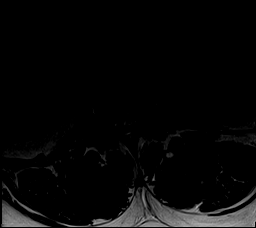

[Series 7: T1 · axial · 4.0mm · 0.39mm/px · z∈[-40,+132]mm · 4 of 37 slices shown (2 of 2)]
[im 1/37]
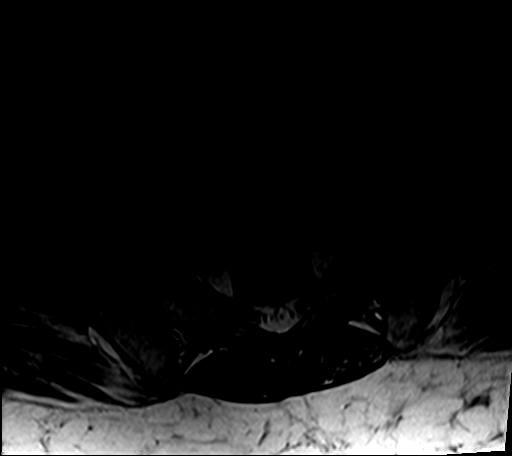
[im 6/37]
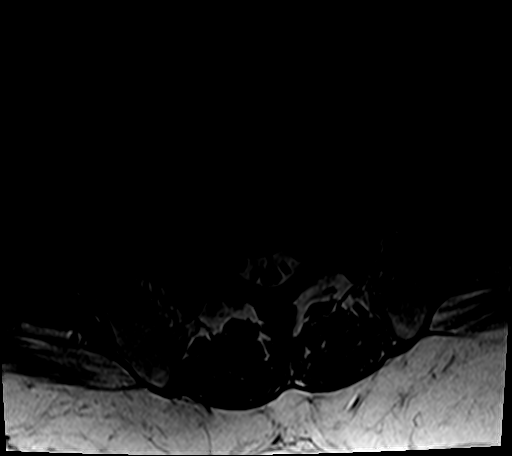
[im 19/37]
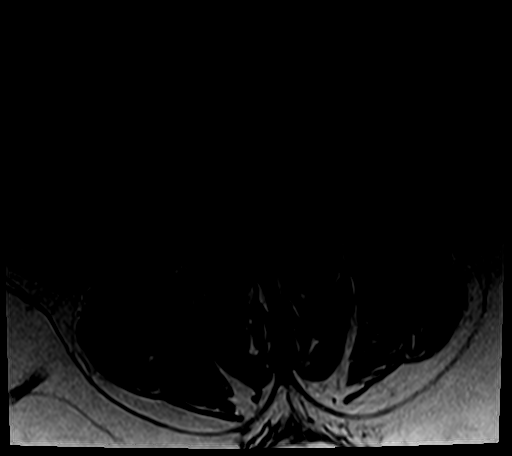
[im 31/37]
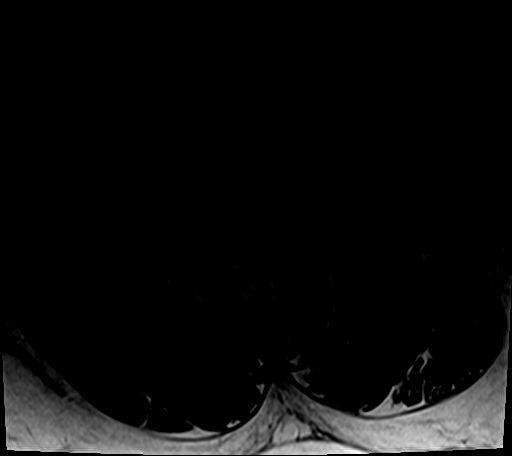

[25 of 48 positions shown; findings below may reference images not displayed]

FINDINGS: Normal alignment of the lumbar vertebral bodies. They demonstrate
normal marrow signal. The intervertebral disc spaces are fairly well
maintained. The last full intervertebral disc space is labeled L5-S1
and this correlates with the plain films. The conus medullaris
terminates at the bottom of L1.

No significant paraspinal or retroperitoneal findings. Small renal
cysts are noted. Slightly prominent cisterna Chyle.

L1-2:  No significant findings.  Mild facet disease.

L2-3: Diffuse bulging annulus with mild flattening of the ventral
thecal sac and mild left lateral recess stenosis. There is a shallow
broad-based lateral foraminal and extra foraminal disc protrusion on
the left which appears to contact and likely irritates the left L2
nerve root. Moderate facet disease.

L3-4: Broad-based right foraminal and extra foraminal disc
protrusion on the right contacting and displacing the right L3 nerve
root. There is also a diffuse bulging annulus, short pedicles and
facet disease contributing to mild spinal and bilateral lateral
recess stenosis.

L4-5: Diffuse bulging annulus and central disc protrusion in
combination with short pedicles and advanced facet disease
contributing to moderate to moderately severe spinal and bilateral
lateral recess stenosis. No significant foraminal stenosis.

L5-S1: Advanced facet disease but no disc protrusions, spinal or
foraminal stenosis.
IMPRESSION: 1. Multilevel disc disease and facet disease.
2. Shallow broad-based lateral foraminal and extra foraminal disc
protrusion on the left at L2-3 likely effecting the left L2 nerve
root.
3. Broad-based right foraminal and extra foraminal disc protrusion
at L3-4 with mass effect on the right L3 nerve root. There is also
multifactorial mild spinal and bilateral lateral recess stenosis at
this level.
4. Multifactorial moderate to moderately severe spinal and bilateral
lateral recess stenosis at L4-5.

## 2017-07-26 ENCOUNTER — Encounter: Payer: Self-pay | Admitting: Internal Medicine

## 2017-07-26 ENCOUNTER — Ambulatory Visit (INDEPENDENT_AMBULATORY_CARE_PROVIDER_SITE_OTHER): Payer: Medicare HMO | Admitting: Internal Medicine

## 2017-07-26 VITALS — BP 140/88 | HR 85 | Temp 98.6°F | Ht 73.0 in | Wt 308.0 lb

## 2017-07-26 DIAGNOSIS — E782 Mixed hyperlipidemia: Secondary | ICD-10-CM | POA: Diagnosis not present

## 2017-07-26 DIAGNOSIS — I10 Essential (primary) hypertension: Secondary | ICD-10-CM

## 2017-07-26 DIAGNOSIS — E1159 Type 2 diabetes mellitus with other circulatory complications: Secondary | ICD-10-CM

## 2017-07-26 MED ORDER — ALBUTEROL SULFATE HFA 108 (90 BASE) MCG/ACT IN AERS: 2.0000 | INHALATION_SPRAY | Freq: Four times a day (QID) | RESPIRATORY_TRACT | 11 refills | Status: DC | PRN

## 2017-07-26 NOTE — Patient Instructions (Addendum)
Please continue all other medications as before, and refills have been done if requested- the inhaler  Be sure to take today's list home to check your med bottles, and ask the pharmacy for any medications that you have run out of  Please have the pharmacy call with any other refills you may need.  Please continue your efforts at being more active, low cholesterol diet, and weight control.  Please keep your appointments with your specialists as you may have planned  You will be contacted regarding the referral for: podiatry for the feet  Please restart the CPAP and remember to use every night, because this helps the blood pressure and risk of stroke  Please return in 6 months, or sooner if needed

## 2017-07-26 NOTE — Progress Notes (Signed)
Subjective:    Patient ID: [name] [name], male    DOB: 10-20-1955, 62 y.o.   MRN: [id number]  HPI  Here to f/u recent hospd, Pt to cont current meds, diet and activity, wt control efforts, and check labs today  Pt denies new neurological symptoms such as new headache, or facial or extremity weakness or numbness, though does have ongoing neuropathy and asks for podiatry referral,  On review, pt is not sure he is taking the coreg.vs the clonidine and thinks he may have run out of one of them.     Pt denies polydipsia, polyuria, or low sugar symptoms such as weakness or confusion improved with po intake.  Pt states overall good compliance with meds, trying to follow lower cholesterol, diabetic diet, wt overall stable but little exercise however.  Admits to initial compliance with diet, meds and activity late last yr but then "fell off the wagon again"; has a friend with DM who also tend to eat whatever and too much,  Has gained wt again after some loss.  Has been more or less compliant with CPAP as well but now plans to do better Wt Readings from Last 3 Encounters:  07/26/17 (!) 308 lb (139.7 kg)  06/27/17 (!) 305 lb 12.8 oz (138.7 kg)  05/31/17 275 lb (124.7 kg)   Past Medical History:  Diagnosis Date  . CAD (coronary artery disease)    a. s/p PCI/BMS pRCA 2002; b. PCI pRCA & DES p/m RCA 2006; c. PCI/DES OM4 2007; d. PCI/CBA to RCA for ISR 10/2006; e.08/2012 STEMI/Cath/PCI:  LAD 95% >> PCI: 3.0x20 Promus Prem DES  //  f. NSTEMI 10/15 >> [facility]: mLAD stent ok, dLAD 70, OM1 CTO, OM2 stent ok, dOM2 90, OM3 30-40, dLCx 90, p-mRCA stent ok, mRCA stent ok w/ 60-70 ISR, EF 50% >> med Rx  . Chronic combined systolic and diastolic CHF (congestive heart failure) ([place])    a. Myoview 1/17: EF 35%  //  b. Echo 1/17 EF 45-50%  . Depression   . Diabetes mellitus ([place])    a. A1c 8.8 08/2012->Metformin initiated. => b. A1c (9/14): 6.6  . GERD (gastroesophageal reflux disease)   . History of nuclear stress test    a.  Myoview 1/17: EF 35%, fixed inferior lateral defect consistent with infarct, no ischemia, intermediate risk  . History of pneumonia   . HTN (hypertension)   . Hyperlipidemia   . Ischemic cardiomyopathy    a. EF 40%; improved to normal;  b. 08/2012 Echo: EF 50-55%, mod LVH.//  c. Echo 9/16: Inf HK, mild LVH, EF 55%, mild LAE, normal RVF, mild RAE, PASP 35 mmHg  //  d. Echo 1/17: EF 45-50%, inferior HK, mild BAE, PASP 33 mmHg  . NSTEMI (non-ST elevated myocardial infarction) ([place])   . Obesity   . OSA (obstructive sleep apnea)    Does not use CPAP as of 05/2011  . Tobacco abuse    Past Surgical History:  Procedure Laterality Date  . CORONARY ANGIOPLASTY WITH STENT PLACEMENT    . CORONARY STENT PLACEMENT  2009  . LEFT HEART CATHETERIZATION WITH CORONARY ANGIOGRAM N/A 08/31/2012   Procedure: LEFT HEART CATHETERIZATION WITH CORONARY ANGIOGRAM;  Surgeon: Burnell Blanks, MD;  Location: [facility] CATH LAB;  Service: Cardiovascular;  Laterality: N/A;  . LEFT HEART CATHETERIZATION WITH CORONARY ANGIOGRAM N/A 11/16/2013   Procedure: LEFT HEART CATHETERIZATION WITH CORONARY ANGIOGRAM;  Surgeon: Blane Ohara, MD;  Location: [facility] CATH LAB;  Service: Cardiovascular;  Laterality:  N/A;  . LEFT HEART CATHETERIZATION WITH CORONARY ANGIOGRAM N/A 03/15/2014   Procedure: LEFT HEART CATHETERIZATION WITH CORONARY ANGIOGRAM;  Surgeon: Peter M Martinique, MD;  Location: [facility] CATH LAB;  Service: Cardiovascular;  Laterality: N/A;  . PERCUTANEOUS CORONARY STENT INTERVENTION (PCI-S) Right 08/31/2012   Procedure: PERCUTANEOUS CORONARY STENT INTERVENTION (PCI-S);  Surgeon: Burnell Blanks, MD;  Location: [facility] CATH LAB;  Service: Cardiovascular;  Laterality: Right;  . PERCUTANEOUS CORONARY STENT INTERVENTION (PCI-S)  11/16/2013   Procedure: PERCUTANEOUS CORONARY STENT INTERVENTION (PCI-S);  Surgeon: Blane Ohara, MD;  Location: [facility] CATH LAB;  Service: Cardiovascular;;    reports that he has quit smoking. His smoking use  included cigarettes. He has a 15.00 pack-year smoking history. he has never used smokeless tobacco. He reports that he does not drink alcohol or use drugs. family history includes Cancer in his mother; Coronary artery disease in his unknown relative; Depression in his mother; Hypertension in his mother; Other in his father. Allergies  Allergen Reactions  . Penicillins Other (See Comments)    Unknown childhood reaction   Current Outpatient Medications on File Prior to Visit  Medication Sig Dispense Refill  . aspirin 81 MG tablet Take 81 mg by mouth daily.    . carvedilol (COREG) 6.25 MG tablet Take 1 tablet (6.25 mg total) by mouth 2 (two) times daily with a meal. *Please call and schedule an appointment* 180 tablet 3  . cetirizine (ZYRTEC) 10 MG tablet Take 1 tablet (10 mg total) by mouth daily. 30 tablet 11  . cloNIDine (CATAPRES) 0.1 MG tablet Take 1 tablet (0.1 mg total) by mouth 2 (two) times daily. 60 tablet 0  . clopidogrel (PLAVIX) 75 MG tablet Take 1 tablet (75 mg total) by mouth daily. *Please call and schedule an appointment* 90 tablet 3  . escitalopram (LEXAPRO) 20 MG tablet Take 1 tablet (20 mg total) by mouth daily. 90 tablet 3  . furosemide (LASIX) 40 MG tablet SEE NOTES (Patient taking differently: Take 40-80 mg by mouth 2 (two) times daily. Take 80 mg in the morning , take 40 mg in the evening  Take an extra 40 mg tablet as needed for weight for gain 3 lbs or more) 360 tablet 1  . gabapentin (NEURONTIN) 300 MG capsule TAKE 1 CAPSULE BY MOUTH THREE TIMES DAILY 90 capsule 0  . glimepiride (AMARYL) 1 MG tablet TAKE 1 TABLET BY MOUTH EVERY DAY WITH BREAKFAST 30 tablet 2  . glipiZIDE (GLUCOTROL XL) 2.5 MG 24 hr tablet Take 1 tablet (2.5 mg total) by mouth daily with breakfast. 90 tablet 3  . isosorbide mononitrate (IMDUR) 30 MG 24 hr tablet Take 3 tablets (90 mg total) by mouth daily. 270 tablet 3  . lactulose (CHRONULAC) 10 GM/15ML solution TAKE 30 ML BY MOUTH TWICE DAILY AS NEEDED  6081 mL 1  . losartan (COZAAR) 100 MG tablet TAKE 1 TABLET(100 MG) BY MOUTH DAILY 90 tablet 3  . Multiple Vitamins-Minerals (MULTIVITAMIN WITH MINERALS) tablet Take 1 tablet by mouth daily. CENTRUM SILVER MENS  50 PLUS    . nitroGLYCERIN (NITROSTAT) 0.4 MG SL tablet PLACE 1 TABLET UNDER THE TONGUE EVERY 5 MINUTES AS NEEDED FOR CHEST PAIN 25 tablet 5  . pantoprazole (PROTONIX) 40 MG tablet TAKE 1 TABLET(40 MG) BY MOUTH DAILY 90 tablet 3  . potassium chloride (K-DUR) 10 MEQ tablet TAKE 2 TABLETS BY MOUTH EVERY MORNING AND 1 TABLET BY MOUTH EVERY EVENING (Patient taking differently: 20 mEq 2 (two) times daily. TAKE 2 TABLETS  BY MOUTH EVERY MORNING AND 1 TABLET BY MOUTH EVERY EVENING) 90 tablet 3  . rosuvastatin (CRESTOR) 40 MG tablet Take 1 tablet (40 mg total) by mouth daily. 90 tablet 3  . traMADol (ULTRAM) 50 MG tablet Take 1 tablet (50 mg total) by mouth every 6 (six) hours as needed. 120 tablet 2  . triamcinolone (NASACORT) 55 MCG/ACT AERO nasal inhaler Place 2 sprays into the nose daily. 1 Inhaler 12   No current facility-administered medications on file prior to visit.    Review of Systems  Constitutional: Negative for other unusual diaphoresis or sweats HENT: Negative for ear discharge or swelling Eyes: Negative for other worsening visual disturbances Respiratory: Negative for stridor or other swelling  Gastrointestinal: Negative for worsening distension or other blood Genitourinary: Negative for retention or other urinary change Musculoskeletal: Negative for other MSK pain or swelling Skin: Negative for color change or other new lesions Neurological: Negative for worsening tremors and other numbness  Psychiatric/Behavioral: Negative for worsening agitation or other fatigue All other system neg per pt    Objective:   Physical Exam BP 140/88   Pulse 85   Temp 98.6 F (37 C) (Oral)   Ht 6\' 1"  (1.854 m)   Wt (!) 308 lb (139.7 kg)   SpO2 96%   BMI 40.64 kg/m  VS noted,    Constitutional: Pt appears in NAD HENT: Head: NCAT.  Right Ear: External ear normal.  Left Ear: External ear normal.  Eyes: . Pupils are equal, round, and reactive to light. Conjunctivae and EOM are normal Nose: without d/c or deformity Neck: Neck supple. Gross normal ROM Cardiovascular: Normal rate and regular rhythm.   Pulmonary/Chest: Effort normal and breath sounds without rales or wheezing.  Neurological: Pt is alert. At baseline orientation, motor grossly intact Skin: Skin is warm. No rashes, other new lesions, no LE edema Psychiatric: Pt behavior is normal without agitation  No other exam findings    Assessment & Plan:

## 2017-07-27 ENCOUNTER — Encounter: Payer: Self-pay | Admitting: Internal Medicine

## 2017-07-27 NOTE — Assessment & Plan Note (Signed)
stable overall by history and exam, recent data reviewed with pt, and pt to continue medical treatment as before,  to f/u any worsening symptoms or concerns Lab Results  Component Value Date   LDLCALC 68 05/30/2017

## 2017-07-27 NOTE — Assessment & Plan Note (Addendum)
High normal recently, o/w stable overall by history and exam, recent data reviewed with pt, and pt to continue medical treatment as before as he needs to make sure of his med use at home,  to f/u any worsening symptoms or concerns BP Readings from Last 3 Encounters:  07/26/17 140/88  06/27/17 116/82  05/31/17 (!) 147/84

## 2017-07-27 NOTE — Assessment & Plan Note (Addendum)
Lab Results  Component Value Date   HGBA1C 6.3 05/14/2017  had been improved recently but now with further wt gain and noncompliance with meds and diet, but plans to get back on track; ok for podiatry referral

## 2017-08-06 ENCOUNTER — Other Ambulatory Visit: Payer: Self-pay | Admitting: Internal Medicine

## 2017-08-12 ENCOUNTER — Ambulatory Visit: Payer: Self-pay

## 2017-08-12 ENCOUNTER — Ambulatory Visit (INDEPENDENT_AMBULATORY_CARE_PROVIDER_SITE_OTHER): Payer: Medicare HMO | Admitting: Podiatry

## 2017-08-12 DIAGNOSIS — L989 Disorder of the skin and subcutaneous tissue, unspecified: Secondary | ICD-10-CM | POA: Diagnosis not present

## 2017-08-12 DIAGNOSIS — M79676 Pain in unspecified toe(s): Secondary | ICD-10-CM

## 2017-08-12 DIAGNOSIS — E0843 Diabetes mellitus due to underlying condition with diabetic autonomic (poly)neuropathy: Secondary | ICD-10-CM | POA: Diagnosis not present

## 2017-08-12 DIAGNOSIS — B351 Tinea unguium: Secondary | ICD-10-CM

## 2017-08-14 NOTE — Progress Notes (Signed)
Subjective: Patient is a 62 y.o. male with PMHx of T2DM presenting to the office today as a new patient with a chief complaint of painful callus lesions to the bilateral feet that have been present for several years. Walking and bearing weight increases the pain. He has not done anything for treatment.   Patient also complains of elongated, thickened nails that cause pain while ambulating in shoes. He is unable to trim his own nails. Patient presents today for further treatment and evaluation.  Past Medical History:  Diagnosis Date  . CAD (coronary artery disease)    a. s/p PCI/BMS pRCA 2002; b. PCI pRCA & DES p/m RCA 2006; c. PCI/DES OM4 2007; d. PCI/CBA to RCA for ISR 10/2006; e.08/2012 STEMI/Cath/PCI:  LAD 95% >> PCI: 3.0x20 Promus Prem DES  //  f. NSTEMI 10/15 >> [facility]: mLAD stent ok, dLAD 70, OM1 CTO, OM2 stent ok, dOM2 90, OM3 30-40, dLCx 90, p-mRCA stent ok, mRCA stent ok w/ 60-70 ISR, EF 50% >> med Rx  . Chronic combined systolic and diastolic CHF (congestive heart failure) ([place])    a. Myoview 1/17: EF 35%  //  b. Echo 1/17 EF 45-50%  . Depression   . Diabetes mellitus ([place])    a. A1c 8.8 08/2012->Metformin initiated. => b. A1c (9/14): 6.6  . GERD (gastroesophageal reflux disease)   . History of nuclear stress test    a. Myoview 1/17: EF 35%, fixed inferior lateral defect consistent with infarct, no ischemia, intermediate risk  . History of pneumonia   . HTN (hypertension)   . Hyperlipidemia   . Ischemic cardiomyopathy    a. EF 40%; improved to normal;  b. 08/2012 Echo: EF 50-55%, mod LVH.//  c. Echo 9/16: Inf HK, mild LVH, EF 55%, mild LAE, normal RVF, mild RAE, PASP 35 mmHg  //  d. Echo 1/17: EF 45-50%, inferior HK, mild BAE, PASP 33 mmHg  . NSTEMI (non-ST elevated myocardial infarction) ([place])   . Obesity   . OSA (obstructive sleep apnea)    Does not use CPAP as of 05/2011  . Tobacco abuse     Objective:  Physical Exam General: Alert and oriented x3 in no acute  distress  Dermatology: Hyperkeratotic lesion present on the bilateral feet x 3. Pain on palpation with a central nucleated core noted. Skin is warm, dry and supple bilateral lower extremities. Negative for open lesions or macerations. Nails are tender, long, thickened and dystrophic with subungual debris, consistent with onychomycosis, 1-5 bilateral. No signs of infection noted.  Vascular: Palpable pedal pulses bilaterally. No edema or erythema noted. Capillary refill within normal limits.  Neurological: Epicritic and protective threshold grossly intact bilaterally.   Musculoskeletal Exam: Pain on palpation at the keratotic lesion noted. Range of motion within normal limits bilateral. Muscle strength 5/5 in all groups bilateral.  Assessment: 1. Onychodystrophic nails 1-5 bilateral with hyperkeratosis of nails.  2. Onychomycosis of nail due to dermatophyte bilateral 3. Pre-ulcerative callus lesions noted to the bilateral feet x 3   Plan of Care:  #1 Patient evaluated. #2 Excisional debridement of keratoic lesion using a chisel blade was performed without incident.  #3 Dressed with light dressing. #4 Mechanical debridement of nails 1-5 bilaterally performed using a nail nipper. Filed with dremel without incident.  #5 Patient is to return to the clinic in 3 months.   Edrick Kins, DPM [facility]  Dr. Edrick Kins, DPM    [address]  [place], [place] [address]                Office [phone]  Fax [phone]   

## 2017-08-15 ENCOUNTER — Ambulatory Visit: Payer: Self-pay | Admitting: Podiatry

## 2017-08-28 ENCOUNTER — Encounter: Payer: Self-pay | Admitting: Cardiology

## 2017-09-05 ENCOUNTER — Other Ambulatory Visit: Payer: Self-pay | Admitting: Internal Medicine

## 2017-09-05 ENCOUNTER — Ambulatory Visit: Payer: Medicare HMO | Admitting: Cardiology

## 2017-09-18 DIAGNOSIS — G4733 Obstructive sleep apnea (adult) (pediatric): Secondary | ICD-10-CM | POA: Diagnosis not present

## 2017-09-24 ENCOUNTER — Telehealth: Payer: Self-pay | Admitting: *Deleted

## 2017-09-24 NOTE — Telephone Encounter (Signed)
Patient has been made aware to AHI being within range at 3.7. Patient has been encouraged and made aware his compliance needs to improve. Left detailed message to call back to confirm patient received the message.

## 2017-09-24 NOTE — Telephone Encounter (Signed)
-----   Message from Sueanne Margarita, MD sent at 09/20/2017  4:27 PM EDT ----- Good AHI on PAP but needs to improve compliance

## 2017-10-25 ENCOUNTER — Other Ambulatory Visit: Payer: Self-pay | Admitting: Internal Medicine

## 2017-10-25 NOTE — Telephone Encounter (Signed)
Done erx 

## 2017-10-25 NOTE — Telephone Encounter (Signed)
08/31/2017 120# 

## 2017-10-31 ENCOUNTER — Ambulatory Visit: Payer: Medicare HMO | Admitting: Cardiology

## 2017-11-04 ENCOUNTER — Other Ambulatory Visit: Payer: Self-pay | Admitting: Internal Medicine

## 2017-11-06 ENCOUNTER — Ambulatory Visit: Payer: Medicare HMO | Admitting: Internal Medicine

## 2017-11-11 ENCOUNTER — Ambulatory Visit (INDEPENDENT_AMBULATORY_CARE_PROVIDER_SITE_OTHER): Payer: Medicare HMO | Admitting: Podiatry

## 2017-11-11 ENCOUNTER — Encounter: Payer: Self-pay | Admitting: Podiatry

## 2017-11-11 DIAGNOSIS — L989 Disorder of the skin and subcutaneous tissue, unspecified: Secondary | ICD-10-CM

## 2017-11-11 DIAGNOSIS — E0843 Diabetes mellitus due to underlying condition with diabetic autonomic (poly)neuropathy: Secondary | ICD-10-CM

## 2017-11-11 DIAGNOSIS — B351 Tinea unguium: Secondary | ICD-10-CM

## 2017-11-11 DIAGNOSIS — M79676 Pain in unspecified toe(s): Secondary | ICD-10-CM

## 2017-11-14 NOTE — Progress Notes (Signed)
Subjective: Patient is a 62 y.o. male with PMHx of T2DM presenting to the office today with a chief complaint of painful callus lesions to the bilateral feet that have been present for several years. Walking and bearing weight increases the pain. He has not done anything for treatment.   Patient also complains of elongated, thickened nails that cause pain while ambulating in shoes. He is unable to trim his own nails. Patient presents today for further treatment and evaluation.  Past Medical History:  Diagnosis Date  . CAD (coronary artery disease)    a. s/p PCI/BMS pRCA 2002; b. PCI pRCA & DES p/m RCA 2006; c. PCI/DES OM4 2007; d. PCI/CBA to RCA for ISR 10/2006; e.08/2012 STEMI/Cath/PCI:  LAD 95% >> PCI: 3.0x20 Promus Prem DES  //  f. NSTEMI 10/15 >> [facility]: mLAD stent ok, dLAD 70, OM1 CTO, OM2 stent ok, dOM2 90, OM3 30-40, dLCx 90, p-mRCA stent ok, mRCA stent ok w/ 60-70 ISR, EF 50% >> med Rx  . Chronic combined systolic and diastolic CHF (congestive heart failure) ([place])    a. Myoview 1/17: EF 35%  //  b. Echo 1/17 EF 45-50%  . Depression   . Diabetes mellitus ([place])    a. A1c 8.8 08/2012->Metformin initiated. => b. A1c (9/14): 6.6  . GERD (gastroesophageal reflux disease)   . History of nuclear stress test    a. Myoview 1/17: EF 35%, fixed inferior lateral defect consistent with infarct, no ischemia, intermediate risk  . History of pneumonia   . HTN (hypertension)   . Hyperlipidemia   . Ischemic cardiomyopathy    a. EF 40%; improved to normal;  b. 08/2012 Echo: EF 50-55%, mod LVH.//  c. Echo 9/16: Inf HK, mild LVH, EF 55%, mild LAE, normal RVF, mild RAE, PASP 35 mmHg  //  d. Echo 1/17: EF 45-50%, inferior HK, mild BAE, PASP 33 mmHg  . NSTEMI (non-ST elevated myocardial infarction) ([place])   . Obesity   . OSA (obstructive sleep apnea)    Does not use CPAP as of 05/2011  . Tobacco abuse     Objective:  Physical Exam General: Alert and oriented x3 in no acute distress  Dermatology:  Hyperkeratotic lesion present on the bilateral feet x 3. Pain on palpation with a central nucleated core noted. Skin is warm, dry and supple bilateral lower extremities. Negative for open lesions or macerations. Nails are tender, long, thickened and dystrophic with subungual debris, consistent with onychomycosis, 1-5 bilateral. No signs of infection noted.  Vascular: Palpable pedal pulses bilaterally. No edema or erythema noted. Capillary refill within normal limits.  Neurological: Epicritic and protective threshold grossly intact bilaterally.   Musculoskeletal Exam: Pain on palpation at the keratotic lesion noted. Range of motion within normal limits bilateral. Muscle strength 5/5 in all groups bilateral.  Assessment: 1. Onychodystrophic nails 1-5 bilateral with hyperkeratosis of nails.  2. Onychomycosis of nail due to dermatophyte bilateral 3. Pre-ulcerative callus lesions noted to the bilateral feet x 3   Plan of Care:  #1 Patient evaluated. #2 Excisional debridement of keratoic lesion using a chisel blade was performed without incident.  #3 Dressed with light dressing. #4 Mechanical debridement of nails 1-5 bilaterally performed using a nail nipper. Filed with dremel without incident.  #5 Patient is to return to the clinic in 3 months.   Edrick Kins, DPM [facility]  Dr. Edrick Kins, DPM    [address]  [place], [place] [address]                Office [phone]  Fax [phone]   

## 2018-01-03 ENCOUNTER — Other Ambulatory Visit: Payer: Self-pay | Admitting: Internal Medicine

## 2018-01-05 NOTE — Progress Notes (Deleted)
Cardiology Office Note:    Date:  01/05/2018   ID:  [name] [name], DOB Jun 14, 1955, MRN [id number]  PCP:  Biagio Borg, MD  Cardiologist:  Sherren Mocha, MD    Referring MD: Biagio Borg, MD   No chief complaint on file.   History of Present Illness:    [name] [name] is a 62 y.o. male with a hx of snoring, nonrestorative sleep and excessive daytime sleepiness.  He has morbid obesity.  PSG showed severe OSA with an AHI of 48/hr mainly occurring during NREM sleep and all occurred in the nonsupine position.  He underwent CPAP titration to 12cm H2O.  he is doing well with his CPAP device.  He tolerates the mask and feels the pressure is adequate.  Since going on CPAP he feels rested in the am and has no significant daytime sleepiness.  He denies any significant mouth or nasal dryness or nasal congestion.  He does not think that he snores.     Past Medical History:  Diagnosis Date  . CAD (coronary artery disease)    a. s/p PCI/BMS pRCA 2002; b. PCI pRCA & DES p/m RCA 2006; c. PCI/DES OM4 2007; d. PCI/CBA to RCA for ISR 10/2006; e.08/2012 STEMI/Cath/PCI:  LAD 95% >> PCI: 3.0x20 Promus Prem DES  //  f. NSTEMI 10/15 >> LHC: mLAD stent ok, dLAD 70, OM1 CTO, OM2 stent ok, dOM2 90, OM3 30-40, dLCx 90, p-mRCA stent ok, mRCA stent ok w/ 60-70 ISR, EF 50% >> med Rx  . Chronic combined systolic and diastolic CHF (congestive heart failure) ([place])    a. Myoview 1/17: EF 35%  //  b. Echo 1/17 EF 45-50%  . Depression   . Diabetes mellitus ([place])    a. A1c 8.8 08/2012->Metformin initiated. => b. A1c (9/14): 6.6  . GERD (gastroesophageal reflux disease)   . History of nuclear stress test    a. Myoview 1/17: EF 35%, fixed inferior lateral defect consistent with infarct, no ischemia, intermediate risk  . History of pneumonia   . HTN (hypertension)   . Hyperlipidemia   . Ischemic cardiomyopathy    a. EF 40%; improved to normal;  b. 08/2012 Echo: EF 50-55%, mod LVH.//  c. Echo 9/16: Inf HK, mild LVH, EF 55%,  mild LAE, normal RVF, mild RAE, PASP 35 mmHg  //  d. Echo 1/17: EF 45-50%, inferior HK, mild BAE, PASP 33 mmHg  . NSTEMI (non-ST elevated myocardial infarction) ([place])   . Obesity   . OSA (obstructive sleep apnea)    Does not use CPAP as of 05/2011  . Tobacco abuse     Past Surgical History:  Procedure Laterality Date  . CORONARY ANGIOPLASTY WITH STENT PLACEMENT    . CORONARY STENT PLACEMENT  2009  . LEFT HEART CATHETERIZATION WITH CORONARY ANGIOGRAM N/A 08/31/2012   Procedure: LEFT HEART CATHETERIZATION WITH CORONARY ANGIOGRAM;  Surgeon: Burnell Blanks, MD;  Location: [facility] CATH LAB;  Service: Cardiovascular;  Laterality: N/A;  . LEFT HEART CATHETERIZATION WITH CORONARY ANGIOGRAM N/A 11/16/2013   Procedure: LEFT HEART CATHETERIZATION WITH CORONARY ANGIOGRAM;  Surgeon: Blane Ohara, MD;  Location: [facility] CATH LAB;  Service: Cardiovascular;  Laterality: N/A;  . LEFT HEART CATHETERIZATION WITH CORONARY ANGIOGRAM N/A 03/15/2014   Procedure: LEFT HEART CATHETERIZATION WITH CORONARY ANGIOGRAM;  Surgeon: Peter M Martinique, MD;  Location: [facility] CATH LAB;  Service: Cardiovascular;  Laterality: N/A;  . PERCUTANEOUS CORONARY STENT INTERVENTION (PCI-S) Right 08/31/2012   Procedure: PERCUTANEOUS CORONARY STENT INTERVENTION (PCI-S);  Surgeon:  Burnell Blanks, MD;  Location: [facility] CATH LAB;  Service: Cardiovascular;  Laterality: Right;  . PERCUTANEOUS CORONARY STENT INTERVENTION (PCI-S)  11/16/2013   Procedure: PERCUTANEOUS CORONARY STENT INTERVENTION (PCI-S);  Surgeon: Blane Ohara, MD;  Location: [facility] CATH LAB;  Service: Cardiovascular;;    Current Medications: No outpatient medications have been marked as taking for the 01/06/18 encounter (Appointment) with [name] Margarita, MD.     Allergies:   Penicillins   Social History   Socioeconomic History  . Marital status: Single    Spouse name: Not on file  . Number of children: 0  . Years of education: Not on file  . Highest education level: Not on  file  Occupational History  . Occupation: retired  Scientific laboratory technician  . Financial resource strain: Not on file  . Food insecurity:    Worry: Not on file    Inability: Not on file  . Transportation needs:    Medical: Not on file    Non-medical: Not on file  Tobacco Use  . Smoking status: Former Smoker    Packs/day: 0.50    Years: 30.00    Pack years: 15.00    Types: Cigarettes  . Smokeless tobacco: Never Used  . Tobacco comment: trying to cut down  Substance and Sexual Activity  . Alcohol use: No  . Drug use: No    Comment: remote marijuana use  . Sexual activity: Not Currently  Lifestyle  . Physical activity:    Days per week: Not on file    Minutes per session: Not on file  . Stress: Not on file  Relationships  . Social connections:    Talks on phone: Not on file    Gets together: Not on file    Attends religious service: Not on file    Active member of club or organization: Not on file    Attends meetings of clubs or organizations: Not on file    Relationship status: Not on file  Other Topics Concern  . Not on file  Social History Narrative   Lives alone.  He has no children.   He retired early due to health problems.           Family History: The patient's family history includes Cancer in his mother; Coronary artery disease in his unknown relative; Depression in his mother; Hypertension in his mother; Other in his father. There is no history of Heart attack or Stroke.  ROS:   Please see the history of present illness.    ROS  All other systems reviewed and negative.   EKGs/Labs/Other Studies Reviewed:    The following studies were reviewed today: PAP downlaod  EKG:  EKG is not ordered today.   Recent Labs: 05/14/2017: ALT 12; Pro B Natriuretic peptide (BNP) 153.0; TSH 1.67 05/29/2017: B Natriuretic Peptide 75.2 05/30/2017: Hemoglobin 14.9; Platelets 192 05/31/2017: BUN 16; Creatinine, Ser 1.36; Potassium 3.2; Sodium 139   Recent Lipid Panel      Component Value Date/Time   CHOL 123 05/30/2017 0225   TRIG 57 05/30/2017 0225   HDL 44 05/30/2017 0225   CHOLHDL 2.8 05/30/2017 0225   VLDL 11 05/30/2017 0225   LDLCALC 68 05/30/2017 0225    Physical Exam:    VS:  There were no vitals taken for this visit.    Wt Readings from Last 3 Encounters:  07/26/17 (!) 308 lb (139.7 kg)  06/27/17 (!) 305 lb 12.8 oz (138.7 kg)  05/31/17 275 lb (124.7 kg)  GEN:  Well nourished, well developed in no acute distress HEENT: Normal NECK: No JVD; No carotid bruits LYMPHATICS: No lymphadenopathy CARDIAC: RRR, no murmurs, rubs, gallops RESPIRATORY:  Clear to auscultation without rales, wheezing or rhonchi  ABDOMEN: Soft, non-tender, non-distended MUSCULOSKELETAL:  No edema; No deformity  SKIN: Warm and dry NEUROLOGIC:  Alert and oriented x 3 PSYCHIATRIC:  Normal affect   ASSESSMENT:    1. OSA (obstructive sleep apnea)   2. Essential hypertension   3. Obesity, Class III, BMI 40-49.9 (morbid obesity) ([place])    PLAN:    In order of problems listed above:  1.  OSA - the patient is tolerating PAP therapy well without any problems. The PAP download was reviewed today and showed an AHI of ***/hr on *** cm H2O with ***% compliance in using more than 4 hours nightly.  The patient has been using and benefiting from PAP use and will continue to benefit from therapy.   2. HTN - BP is well controlled on exam today.  He will continue on losartan 100 mg daily, clonidine 0.1 mg twice daily, carvedilol 6.25 mill grams twice daily and Imdur 30 mill grams daily  3.  Obesity - I have encouraged him to get into a routine exercise program and cut back on carbs and portions.      Medication Adjustments/Labs and Tests Ordered: Current medicines are reviewed at length with the patient today.  Concerns regarding medicines are outlined above.  No orders of the defined types were placed in this encounter.  No orders of the defined types were placed in this  encounter.   Signed, Fransico Him, MD  01/05/2018 2:44 PM    [place]

## 2018-01-06 ENCOUNTER — Ambulatory Visit: Payer: Medicare HMO | Admitting: Cardiology

## 2018-01-16 ENCOUNTER — Telehealth: Payer: Self-pay | Admitting: *Deleted

## 2018-01-16 NOTE — Telephone Encounter (Signed)
-----   Message from Sueanne Margarita, MD sent at 01/10/2018  4:51 PM EDT ----- Good AHI on PAP but needs to improve compliance

## 2018-01-16 NOTE — Telephone Encounter (Signed)
Informed patient of compliance results and verbalized understanding was indicated. Patient is aware and agreeable to AHI being within range at 2.1. Patient is aware and agreeable to improving in compliance with machine usage.

## 2018-01-24 ENCOUNTER — Ambulatory Visit: Payer: Medicare HMO | Admitting: Internal Medicine

## 2018-01-24 DIAGNOSIS — Z0289 Encounter for other administrative examinations: Secondary | ICD-10-CM

## 2018-02-02 ENCOUNTER — Other Ambulatory Visit: Payer: Self-pay | Admitting: Internal Medicine

## 2018-02-12 ENCOUNTER — Ambulatory Visit (INDEPENDENT_AMBULATORY_CARE_PROVIDER_SITE_OTHER): Payer: Medicare HMO | Admitting: Podiatry

## 2018-02-12 ENCOUNTER — Encounter: Payer: Self-pay | Admitting: Podiatry

## 2018-02-12 DIAGNOSIS — E0843 Diabetes mellitus due to underlying condition with diabetic autonomic (poly)neuropathy: Secondary | ICD-10-CM | POA: Diagnosis not present

## 2018-02-12 DIAGNOSIS — B351 Tinea unguium: Secondary | ICD-10-CM

## 2018-02-12 DIAGNOSIS — M79676 Pain in unspecified toe(s): Secondary | ICD-10-CM

## 2018-02-12 DIAGNOSIS — L989 Disorder of the skin and subcutaneous tissue, unspecified: Secondary | ICD-10-CM | POA: Diagnosis not present

## 2018-02-12 DIAGNOSIS — M2041 Other hammer toe(s) (acquired), right foot: Secondary | ICD-10-CM

## 2018-02-12 DIAGNOSIS — M2042 Other hammer toe(s) (acquired), left foot: Secondary | ICD-10-CM

## 2018-02-17 NOTE — Progress Notes (Signed)
Subjective: Patient is a 62 y.o. male presenting to the office today with a chief complaint of painful callus lesions to the bilateral feet that have been present for several months. Walking and bearing weight increases the pain. He has not done anything for treatment at home.   Patient also complains of elongated, thickened nails that cause pain while ambulating in shoes. He is unable to trim his own nails. Patient presents today for further treatment and evaluation.  Past Medical History:  Diagnosis Date  . CAD (coronary artery disease)    a. s/p PCI/BMS pRCA 2002; b. PCI pRCA & DES p/m RCA 2006; c. PCI/DES OM4 2007; d. PCI/CBA to RCA for ISR 10/2006; e.08/2012 STEMI/Cath/PCI:  LAD 95% >> PCI: 3.0x20 Promus Prem DES  //  f. NSTEMI 10/15 >> LHC: mLAD stent ok, dLAD 70, OM1 CTO, OM2 stent ok, dOM2 90, OM3 30-40, dLCx 90, p-mRCA stent ok, mRCA stent ok w/ 60-70 ISR, EF 50% >> med Rx  . Chronic combined systolic and diastolic CHF (congestive heart failure) ([place])    a. Myoview 1/17: EF 35%  //  b. Echo 1/17 EF 45-50%  . Depression   . Diabetes mellitus ([place])    a. A1c 8.8 08/2012->Metformin initiated. => b. A1c (9/14): 6.6  . GERD (gastroesophageal reflux disease)   . History of nuclear stress test    a. Myoview 1/17: EF 35%, fixed inferior lateral defect consistent with infarct, no ischemia, intermediate risk  . History of pneumonia   . HTN (hypertension)   . Hyperlipidemia   . Ischemic cardiomyopathy    a. EF 40%; improved to normal;  b. 08/2012 Echo: EF 50-55%, mod LVH.//  c. Echo 9/16: Inf HK, mild LVH, EF 55%, mild LAE, normal RVF, mild RAE, PASP 35 mmHg  //  d. Echo 1/17: EF 45-50%, inferior HK, mild BAE, PASP 33 mmHg  . NSTEMI (non-ST elevated myocardial infarction) ([place])   . Obesity   . OSA (obstructive sleep apnea)    Does not use CPAP as of 05/2011  . Tobacco abuse     Objective:  Physical Exam General: Alert and oriented x3 in no acute distress  Dermatology: Hyperkeratotic  lesions present on the bilateral feet x 4. Pain on palpation with a central nucleated core noted. Skin is warm, dry and supple bilateral lower extremities. Negative for open lesions or macerations. Nails are tender, long, thickened and dystrophic with subungual debris, consistent with onychomycosis, 1-5 bilateral. No signs of infection noted.  Vascular: Palpable pedal pulses bilaterally. No edema or erythema noted. Capillary refill within normal limits.  Neurological: Epicritic and protective threshold diminished bilaterally.   Musculoskeletal Exam: Pain on palpation at the keratotic lesion noted. Range of motion within normal limits bilateral. Muscle strength 5/5 in all groups bilateral.  Assessment: 1. Onychodystrophic nails 1-5 bilateral with hyperkeratosis of nails.  2. Onychomycosis of nail due to dermatophyte bilateral 3. Pre-ulcerative callus lesions noted to the bilateral feet x 4   Plan of Care:  1. Patient evaluated. 2. Excisional debridement of keratoic lesion using a chisel blade was performed without incident.  3. Dressed with light dressing. 4. Mechanical debridement of nails 1-5 bilaterally performed using a nail nipper. Filed with dremel without incident.  5. Appointment with Liliane Channel for DM shoes and insoles.  6. Patient is to return to the clinic in 3 months.   Edrick Kins, DPM [facility]  Dr. Edrick Kins, DPM    [address]  [place], [place] [address]                Office [phone]  Fax [phone]

## 2018-02-20 ENCOUNTER — Ambulatory Visit: Payer: Medicare HMO | Admitting: Internal Medicine

## 2018-03-11 ENCOUNTER — Ambulatory Visit: Payer: Medicare HMO | Admitting: Internal Medicine

## 2018-03-11 DIAGNOSIS — Z0289 Encounter for other administrative examinations: Secondary | ICD-10-CM

## 2018-03-18 ENCOUNTER — Telehealth: Payer: Self-pay | Admitting: Internal Medicine

## 2018-03-18 ENCOUNTER — Ambulatory Visit: Payer: Medicare HMO | Admitting: Internal Medicine

## 2018-03-18 NOTE — Telephone Encounter (Signed)
Patient missed appointment today.  Patient has rescheduled to 11/5.  Patient would like his meds refilled until this time.  Patient uses [facility] on [address].

## 2018-03-19 ENCOUNTER — Other Ambulatory Visit: Payer: Medicare HMO | Admitting: Orthotics

## 2018-03-19 MED ORDER — CLOPIDOGREL BISULFATE 75 MG PO TABS: 75.0000 mg | ORAL_TABLET | Freq: Every day | ORAL | 0 refills | Status: DC

## 2018-03-19 MED ORDER — CARVEDILOL 6.25 MG PO TABS: 6.2500 mg | ORAL_TABLET | Freq: Two times a day (BID) | ORAL | 0 refills | Status: DC

## 2018-03-21 ENCOUNTER — Encounter: Payer: Self-pay | Admitting: Cardiology

## 2018-03-31 DIAGNOSIS — G4733 Obstructive sleep apnea (adult) (pediatric): Secondary | ICD-10-CM | POA: Diagnosis not present

## 2018-04-01 NOTE — Progress Notes (Deleted)
Subjective:   [name] [name] is a 62 y.o. male who presents for Medicare Annual/Subsequent preventive examination.  Review of Systems:  No ROS.  Medicare Wellness Visit. Additional risk factors are reflected in the social history.    Sleep patterns: {SX; SLEEP PATTERNS:18802::"feels rested on waking","does not get up to void","gets up *** times nightly to void","sleeps *** hours nightly"}.    Home Safety/Smoke Alarms: Feels safe in home. Smoke alarms in place.  Living environment; residence and Firearm Safety: {Rehab home environment / accessibility:30080::"no firearms","firearms stored safely"}. Seat Belt Safety/Bike Helmet: Wears seat belt.   PSA-  Lab Results  Component Value Date   PSA 0.83 05/14/2017   PSA 0.75 06/27/2015   PSA 0.75 01/26/2014       Objective:    Vitals: There were no vitals taken for this visit.  There is no height or weight on file to calculate BMI.  Advanced Directives 05/30/2017 05/29/2017 03/29/2017 12/20/2015 12/15/2015 08/23/2015 08/18/2015  Does Patient Have a Medical Advance Directive? No No No No No No No  Would patient like information on creating a medical advance directive? No - Patient declined No - Patient declined Yes (ED - Information included in AVS) No - patient declined information No - patient declined information No - patient declined information No - patient declined information  Pre-existing out of facility DNR order (yellow form or pink MOST form) - - - - - - -    Tobacco Social History   Tobacco Use  Smoking Status Former Smoker  . Packs/day: 0.50  . Years: 30.00  . Pack years: 15.00  . Types: Cigarettes  Smokeless Tobacco Never Used  Tobacco Comment   trying to cut down     Counseling given: Not Answered Comment: trying to cut down  Past Medical History:  Diagnosis Date  . CAD (coronary artery disease)    a. s/p PCI/BMS pRCA 2002; b. PCI pRCA & DES p/m RCA 2006; c. PCI/DES OM4 2007; d. PCI/CBA to RCA for ISR 10/2006;  e.08/2012 STEMI/Cath/PCI:  LAD 95% >> PCI: 3.0x20 Promus Prem DES  //  f. NSTEMI 10/15 >> LHC: mLAD stent ok, dLAD 70, OM1 CTO, OM2 stent ok, dOM2 90, OM3 30-40, dLCx 90, p-mRCA stent ok, mRCA stent ok w/ 60-70 ISR, EF 50% >> med Rx  . Chronic combined systolic and diastolic CHF (congestive heart failure) ([place])    a. Myoview 1/17: EF 35%  //  b. Echo 1/17 EF 45-50%  . Depression   . Diabetes mellitus ([place])    a. A1c 8.8 08/2012->Metformin initiated. => b. A1c (9/14): 6.6  . GERD (gastroesophageal reflux disease)   . History of nuclear stress test    a. Myoview 1/17: EF 35%, fixed inferior lateral defect consistent with infarct, no ischemia, intermediate risk  . History of pneumonia   . HTN (hypertension)   . Hyperlipidemia   . Ischemic cardiomyopathy    a. EF 40%; improved to normal;  b. 08/2012 Echo: EF 50-55%, mod LVH.//  c. Echo 9/16: Inf HK, mild LVH, EF 55%, mild LAE, normal RVF, mild RAE, PASP 35 mmHg  //  d. Echo 1/17: EF 45-50%, inferior HK, mild BAE, PASP 33 mmHg  . NSTEMI (non-ST elevated myocardial infarction) ([place])   . Obesity   . OSA (obstructive sleep apnea)    Does not use CPAP as of 05/2011  . Tobacco abuse    Past Surgical History:  Procedure Laterality Date  . CORONARY ANGIOPLASTY WITH STENT PLACEMENT    .  CORONARY STENT PLACEMENT  2009  . LEFT HEART CATHETERIZATION WITH CORONARY ANGIOGRAM N/A 08/31/2012   Procedure: LEFT HEART CATHETERIZATION WITH CORONARY ANGIOGRAM;  Surgeon: Burnell Blanks, MD;  Location: [facility] CATH LAB;  Service: Cardiovascular;  Laterality: N/A;  . LEFT HEART CATHETERIZATION WITH CORONARY ANGIOGRAM N/A 11/16/2013   Procedure: LEFT HEART CATHETERIZATION WITH CORONARY ANGIOGRAM;  Surgeon: Blane Ohara, MD;  Location: [facility] CATH LAB;  Service: Cardiovascular;  Laterality: N/A;  . LEFT HEART CATHETERIZATION WITH CORONARY ANGIOGRAM N/A 03/15/2014   Procedure: LEFT HEART CATHETERIZATION WITH CORONARY ANGIOGRAM;  Surgeon: Peter M Martinique, MD;  Location: [facility]  CATH LAB;  Service: Cardiovascular;  Laterality: N/A;  . PERCUTANEOUS CORONARY STENT INTERVENTION (PCI-S) Right 08/31/2012   Procedure: PERCUTANEOUS CORONARY STENT INTERVENTION (PCI-S);  Surgeon: Burnell Blanks, MD;  Location: [facility] CATH LAB;  Service: Cardiovascular;  Laterality: Right;  . PERCUTANEOUS CORONARY STENT INTERVENTION (PCI-S)  11/16/2013   Procedure: PERCUTANEOUS CORONARY STENT INTERVENTION (PCI-S);  Surgeon: Blane Ohara, MD;  Location: [facility] CATH LAB;  Service: Cardiovascular;;   Family History  Problem Relation Age of Onset  . Depression Mother   . Cancer Mother        ovarian  . Hypertension Mother        Died, 55  . Other Father        motor vehicle accident  . Coronary artery disease Unknown   . Heart attack Neg Hx   . Stroke Neg Hx    Social History   Socioeconomic History  . Marital status: Single    Spouse name: Not on file  . Number of children: 0  . Years of education: Not on file  . Highest education level: Not on file  Occupational History  . Occupation: retired  Scientific laboratory technician  . Financial resource strain: Not on file  . Food insecurity:    Worry: Not on file    Inability: Not on file  . Transportation needs:    Medical: Not on file    Non-medical: Not on file  Tobacco Use  . Smoking status: Former Smoker    Packs/day: 0.50    Years: 30.00    Pack years: 15.00    Types: Cigarettes  . Smokeless tobacco: Never Used  . Tobacco comment: trying to cut down  Substance and Sexual Activity  . Alcohol use: No  . Drug use: No    Comment: remote marijuana use  . Sexual activity: Not Currently  Lifestyle  . Physical activity:    Days per week: Not on file    Minutes per session: Not on file  . Stress: Not on file  Relationships  . Social connections:    Talks on phone: Not on file    Gets together: Not on file    Attends religious service: Not on file    Active member of club or organization: Not on file    Attends meetings of clubs or  organizations: Not on file    Relationship status: Not on file  Other Topics Concern  . Not on file  Social History Narrative   Lives alone.  He has no children.   He retired early due to health problems.          Outpatient Encounter Medications as of 04/02/2018  Medication Sig  . albuterol (PROVENTIL HFA;VENTOLIN HFA) 108 (90 Base) MCG/ACT inhaler Inhale 2 puffs into the lungs every 6 (six) hours as needed for wheezing or shortness of breath.  [name] aspirin 81 MG  tablet Take 81 mg by mouth daily.  . carvedilol (COREG) 6.25 MG tablet Take 1 tablet (6.25 mg total) by mouth 2 (two) times daily with a meal. *Please call and schedule an appointment*  . cetirizine (ZYRTEC) 10 MG tablet TAKE 1 TABLET(10 MG) BY MOUTH DAILY  . cloNIDine (CATAPRES) 0.1 MG tablet Take 1 tablet (0.1 mg total) by mouth 2 (two) times daily.  . clopidogrel (PLAVIX) 75 MG tablet Take 1 tablet (75 mg total) by mouth daily. *Please call and schedule an appointment*  . escitalopram (LEXAPRO) 20 MG tablet Take 1 tablet (20 mg total) by mouth daily.  . furosemide (LASIX) 40 MG tablet SEE NOTES (Patient taking differently: Take 40-80 mg by mouth 2 (two) times daily. Take 80 mg in the morning , take 40 mg in the evening  Take an extra 40 mg tablet as needed for weight for gain 3 lbs or more)  . gabapentin (NEURONTIN) 300 MG capsule TAKE 1 CAPSULE BY MOUTH THREE TIMES DAILY  . glimepiride (AMARYL) 1 MG tablet TAKE 1 TABLET BY MOUTH EVERY DAY WITH BREAKFAST  . glipiZIDE (GLUCOTROL XL) 2.5 MG 24 hr tablet Take 1 tablet (2.5 mg total) by mouth daily with breakfast.  . isosorbide mononitrate (IMDUR) 30 MG 24 hr tablet Take 3 tablets (90 mg total) by mouth daily.  [name] lactulose (CHRONULAC) 10 GM/15ML solution TAKE 30 ML BY MOUTH TWICE DAILY AS NEEDED  . losartan (COZAAR) 100 MG tablet TAKE 1 TABLET(100 MG) BY MOUTH DAILY  . Multiple Vitamins-Minerals (MULTIVITAMIN WITH MINERALS) tablet Take 1 tablet by mouth daily. CENTRUM SILVER MENS   50 PLUS  . nitroGLYCERIN (NITROSTAT) 0.4 MG SL tablet PLACE 1 TABLET UNDER THE TONGUE EVERY 5 MINUTES AS NEEDED FOR CHEST PAIN  . pantoprazole (PROTONIX) 40 MG tablet TAKE 1 TABLET(40 MG) BY MOUTH DAILY  . potassium chloride (K-DUR) 10 MEQ tablet TAKE 2 TABLETS BY MOUTH EVERY MORNING AND 1 TABLET BY MOUTH EVERY EVENING  . rosuvastatin (CRESTOR) 40 MG tablet Take 1 tablet (40 mg total) by mouth daily.  . rosuvastatin (CRESTOR) 40 MG tablet TAKE 1 TABLET(40 MG) BY MOUTH DAILY  . traMADol (ULTRAM) 50 MG tablet TAKE 1 TABLET(50 MG) BY MOUTH EVERY 6 HOURS AS NEEDED  . triamcinolone (NASACORT) 55 MCG/ACT AERO nasal inhaler Place 2 sprays into the nose daily.   No facility-administered encounter medications on file as of 04/02/2018.     Activities of Daily Living In your present state of health, do you have any difficulty performing the following activities: 05/30/2017  Hearing? N  Vision? N  Difficulty concentrating or making decisions? N  Walking or climbing stairs? N  Dressing or bathing? N  Doing errands, shopping? N  Some recent data might be hidden    Patient Care Team: Biagio Borg, MD as PCP - General (Internal Medicine) Sherren Mocha, MD as PCP - Cardiology (Cardiology) Sherren Mocha, MD as Consulting Physician (Cardiology) Sharmon Revere as Physician Assistant (Cardiology) Sueanne Margarita, MD as Consulting Physician (Sleep Medicine)   Assessment:   This is a routine wellness examination for [facility]. Physical assessment deferred to PCP.  Exercise Activities and Dietary recommendations   Diet (meal preparation, eat out, water intake, caffeinated beverages, dairy products, fruits and vegetables): {Desc; diets:16563}   Goals    . Reduce sodium intake     Case Manager discussed ways to reduce sodium intake with patient when cooking.  Suggestions:  Low sodium broth to cook vegetables.  Use Mrs.  Dash to season and or marinate meats instead of seasoning salt and/or  meat tenderizer.    . Start to eat healthy and exercise.     Start to read labels, do portion control, increase the amount of water I drink. Increase the amount I socialize, start to walk around my neighborhood. Check into going to the [facility].       Fall Risk Fall Risk  03/29/2017 05/04/2016 12/20/2015 08/23/2015 08/18/2015  Falls in the past year? No No No Yes Yes  Number falls in past yr: - - - 2 or more 2 or more  Injury with Fall? - - - Yes No  Risk Factor Category  - - - High Fall Risk High Fall Risk  Risk for fall due to : - - Impaired balance/gait;Impaired mobility;Medication side effect;Impaired vision History of fall(s);Impaired mobility History of fall(s);Impaired mobility;Other (Comment)  Risk for fall due to: Comment - - - - Obesity  Follow up - - - Education provided;Falls prevention discussed Falls evaluation completed;Education provided;Falls prevention discussed    Depression Screen PHQ 2/9 Scores 03/29/2017 05/04/2016 12/20/2015 08/23/2015  PHQ - 2 Score 4 0 5 0  PHQ- 9 Score 8 - 14 -    Cognitive Function MMSE - Mini Mental State Exam 03/29/2017  Orientation to time 5  Orientation to Place 5  Registration 3  Attention/ Calculation 5  Recall 2  Language- name 2 objects 2  Language- repeat 1  Language- follow 3 step command 3  Language- read & follow direction 1  Write a sentence 1  Copy design 1  Total score 29        Immunization History  Administered Date(s) Administered  . Influenza Whole 04/12/2008, 04/12/2010, 02/10/2011  . Influenza, Seasonal, Injecte, Preservative Fre 03/11/2013  . Influenza,inj,Quad PF,6+ Mos 02/12/2015, 05/04/2016, 03/29/2017  . Pneumococcal Conjugate-13 06/26/2013  . Pneumococcal Polysaccharide-23 07/21/2009, 06/27/2015  . Td 03/24/2009   Screening Tests Health Maintenance  Topic Date Due  . OPHTHALMOLOGY EXAM  02/14/1967  . HEMOGLOBIN A1C  11/12/2017  . INFLUENZA VACCINE  01/09/2018  . FOOT EXAM  03/29/2018  .  TETANUS/TDAP  03/25/2019  . COLONOSCOPY  03/17/2022  . PNEUMOCOCCAL POLYSACCHARIDE VACCINE AGE 38-64 HIGH RISK  Completed  . Hepatitis C Screening  Completed  . HIV Screening  Completed      Plan:    I have personally reviewed and noted the following in the patient's chart:   . Medical and social history . Use of alcohol, tobacco or illicit drugs  . Current medications and supplements . Functional ability and status . Nutritional status . Physical activity . Advanced directives . List of other physicians . Hospitalizations, surgeries, and ER visits in previous 12 monthsitals . Screenings to include cognitive, depression, and falls . Referrals and appointments  In addition, I have reviewed and discussed with patient certain preventive protocols, quality metrics, and best practice recommendations. A written personalized care plan for preventive services as well as general preventive health recommendations were provided to patient.     Michiel Cowboy, RN  04/01/2018

## 2018-04-02 ENCOUNTER — Ambulatory Visit: Payer: Medicare HMO

## 2018-04-03 ENCOUNTER — Other Ambulatory Visit: Payer: Self-pay | Admitting: Internal Medicine

## 2018-04-07 ENCOUNTER — Ambulatory Visit: Payer: Medicare HMO | Admitting: Cardiology

## 2018-04-08 ENCOUNTER — Encounter: Payer: Self-pay | Admitting: Cardiology

## 2018-04-15 ENCOUNTER — Ambulatory Visit: Payer: Self-pay | Admitting: *Deleted

## 2018-04-15 ENCOUNTER — Ambulatory Visit: Payer: Medicare HMO | Admitting: Internal Medicine

## 2018-04-15 DIAGNOSIS — Z0289 Encounter for other administrative examinations: Secondary | ICD-10-CM

## 2018-04-15 NOTE — Telephone Encounter (Signed)
No triage done as pt hung up.   Has a 3:20 appt with Dr. [name] today per agent.   He was wanting to cancel the appt.     Answer Assessment - Initial Assessment Questions 1. BLOOD PRESSURE: "What is the blood pressure?" "Did you take at least two measurements 5 minutes apart?"     No triage done.   Pt hung up. 2. ONSET: "When did you take your blood pressure?"     *No Answer* 3. HOW: "How did you obtain the blood pressure?" (e.g., visiting nurse, automatic home BP monitor)     *No Answer* 4. HISTORY: "Do you have a history of low blood pressure?" "What is your blood pressure normally?"     *No Answer* 5. MEDICATIONS: "Are you taking any medications for blood pressure?" If yes: "Have they been changed recently?"     *No Answer* 6. PULSE RATE: "Do you know what your pulse rate is?"      *No Answer* 7. OTHER SYMPTOMS: "Have you been sick recently?" "Have you had a recent injury?"     *No Answer* 8. PREGNANCY: "Is there any chance you are pregnant?" "When was your last menstrual period?"     *No Answer*  Protocols used: LOW BLOOD PRESSURE-A-AH

## 2018-04-17 DIAGNOSIS — K5900 Constipation, unspecified: Secondary | ICD-10-CM | POA: Diagnosis not present

## 2018-04-17 DIAGNOSIS — Z7901 Long term (current) use of anticoagulants: Secondary | ICD-10-CM | POA: Diagnosis not present

## 2018-04-17 DIAGNOSIS — R1310 Dysphagia, unspecified: Secondary | ICD-10-CM | POA: Diagnosis not present

## 2018-04-25 ENCOUNTER — Telehealth: Payer: Self-pay

## 2018-04-25 NOTE — Telephone Encounter (Signed)
LMOM for pt to call our office to schedule appt for surgical clearance.

## 2018-05-03 ENCOUNTER — Other Ambulatory Visit: Payer: Self-pay | Admitting: Internal Medicine

## 2018-05-03 DIAGNOSIS — I2510 Atherosclerotic heart disease of native coronary artery without angina pectoris: Secondary | ICD-10-CM

## 2018-05-05 ENCOUNTER — Ambulatory Visit (INDEPENDENT_AMBULATORY_CARE_PROVIDER_SITE_OTHER): Payer: Medicare HMO | Admitting: Internal Medicine

## 2018-05-05 ENCOUNTER — Encounter: Payer: Self-pay | Admitting: Internal Medicine

## 2018-05-05 VITALS — BP 146/88 | HR 74 | Temp 98.7°F | Ht 73.0 in | Wt 318.0 lb

## 2018-05-05 DIAGNOSIS — E1159 Type 2 diabetes mellitus with other circulatory complications: Secondary | ICD-10-CM | POA: Diagnosis not present

## 2018-05-05 DIAGNOSIS — I10 Essential (primary) hypertension: Secondary | ICD-10-CM

## 2018-05-05 DIAGNOSIS — I2510 Atherosclerotic heart disease of native coronary artery without angina pectoris: Secondary | ICD-10-CM | POA: Diagnosis not present

## 2018-05-05 DIAGNOSIS — G4733 Obstructive sleep apnea (adult) (pediatric): Secondary | ICD-10-CM

## 2018-05-05 DIAGNOSIS — M545 Low back pain: Secondary | ICD-10-CM

## 2018-05-05 DIAGNOSIS — G8929 Other chronic pain: Secondary | ICD-10-CM | POA: Diagnosis not present

## 2018-05-05 DIAGNOSIS — Z0000 Encounter for general adult medical examination without abnormal findings: Secondary | ICD-10-CM | POA: Diagnosis not present

## 2018-05-05 DIAGNOSIS — Z23 Encounter for immunization: Secondary | ICD-10-CM | POA: Diagnosis not present

## 2018-05-05 DIAGNOSIS — Z9861 Coronary angioplasty status: Secondary | ICD-10-CM | POA: Diagnosis not present

## 2018-05-05 MED ORDER — CARVEDILOL 12.5 MG PO TABS: 12.5000 mg | ORAL_TABLET | Freq: Two times a day (BID) | ORAL | 3 refills | Status: DC

## 2018-05-05 MED ORDER — CLONIDINE HCL 0.3 MG PO TABS: 0.3000 mg | ORAL_TABLET | Freq: Two times a day (BID) | ORAL | 3 refills | Status: DC

## 2018-05-05 MED ORDER — TRAMADOL HCL 50 MG PO TABS: ORAL_TABLET | ORAL | 5 refills | Status: DC

## 2018-05-05 MED ORDER — GABAPENTIN 300 MG PO CAPS: 300.0000 mg | ORAL_CAPSULE | Freq: Three times a day (TID) | ORAL | 1 refills | Status: DC

## 2018-05-05 NOTE — Assessment & Plan Note (Addendum)
Stable, for card referral to f/u  Note:  Total time for pt hx, exam, review of record with pt in the room, determination of diagnoses and plan for further eval and tx is > 40 min, with over 50% spent in coordination and counseling of patient including the differential dx, tx, further evaluation and other management of cad, osa, DM, HTN, chronic lbp

## 2018-05-05 NOTE — Assessment & Plan Note (Signed)
Also for pulm referral for f/u

## 2018-05-05 NOTE — Progress Notes (Signed)
Subjective:    Patient ID: [name] [name], male    DOB: 1955/10/09, 62 y.o.   MRN: [id number]  HPI  Here to f/u; overall doing ok,  Pt denies chest pain, increasing sob or doe, wheezing, orthopnea, PND, increased LE swelling, palpitations, dizziness or syncope.  Pt denies new neurological symptoms such as new headache, or facial or extremity weakness or numbness.  Pt denies polydipsia, polyuria, or low sugar episode.  Pt states overall good compliance with meds, mostly trying to follow appropriate diet, with wt overall stable,  but little exercise however.   Has some confusion over BP meds including the clonidine and coreg.  Due for f/u cards and pulm for cad and osa.  Asks for tramadol refill, Pt continues to have recurring LBP without change in severity, bowel or bladder change, fever, wt loss,  worsening LE pain/numbness/weakness, gait change or falls. Past Medical History:  Diagnosis Date  . CAD (coronary artery disease)    a. s/p PCI/BMS pRCA 2002; b. PCI pRCA & DES p/m RCA 2006; c. PCI/DES OM4 2007; d. PCI/CBA to RCA for ISR 10/2006; e.08/2012 STEMI/Cath/PCI:  LAD 95% >> PCI: 3.0x20 Promus Prem DES  //  f. NSTEMI 10/15 >> LHC: mLAD stent ok, dLAD 70, OM1 CTO, OM2 stent ok, dOM2 90, OM3 30-40, dLCx 90, p-mRCA stent ok, mRCA stent ok w/ 60-70 ISR, EF 50% >> med Rx  . Chronic combined systolic and diastolic CHF (congestive heart failure) ([place])    a. Myoview 1/17: EF 35%  //  b. Echo 1/17 EF 45-50%  . Depression   . Diabetes mellitus ([place])    a. A1c 8.8 08/2012->Metformin initiated. => b. A1c (9/14): 6.6  . GERD (gastroesophageal reflux disease)   . History of nuclear stress test    a. Myoview 1/17: EF 35%, fixed inferior lateral defect consistent with infarct, no ischemia, intermediate risk  . History of pneumonia   . HTN (hypertension)   . Hyperlipidemia   . Ischemic cardiomyopathy    a. EF 40%; improved to normal;  b. 08/2012 Echo: EF 50-55%, mod LVH.//  c. Echo 9/16: Inf HK, mild LVH, EF 55%,  mild LAE, normal RVF, mild RAE, PASP 35 mmHg  //  d. Echo 1/17: EF 45-50%, inferior HK, mild BAE, PASP 33 mmHg  . NSTEMI (non-ST elevated myocardial infarction) ([place])   . Obesity   . OSA (obstructive sleep apnea)    Does not use CPAP as of 05/2011  . Tobacco abuse    Past Surgical History:  Procedure Laterality Date  . CORONARY ANGIOPLASTY WITH STENT PLACEMENT    . CORONARY STENT PLACEMENT  2009  . LEFT HEART CATHETERIZATION WITH CORONARY ANGIOGRAM N/A 08/31/2012   Procedure: LEFT HEART CATHETERIZATION WITH CORONARY ANGIOGRAM;  Surgeon: Burnell Blanks, MD;  Location: [facility] CATH LAB;  Service: Cardiovascular;  Laterality: N/A;  . LEFT HEART CATHETERIZATION WITH CORONARY ANGIOGRAM N/A 11/16/2013   Procedure: LEFT HEART CATHETERIZATION WITH CORONARY ANGIOGRAM;  Surgeon: Blane Ohara, MD;  Location: [facility] CATH LAB;  Service: Cardiovascular;  Laterality: N/A;  . LEFT HEART CATHETERIZATION WITH CORONARY ANGIOGRAM N/A 03/15/2014   Procedure: LEFT HEART CATHETERIZATION WITH CORONARY ANGIOGRAM;  Surgeon: Peter M Martinique, MD;  Location: [facility] CATH LAB;  Service: Cardiovascular;  Laterality: N/A;  . PERCUTANEOUS CORONARY STENT INTERVENTION (PCI-S) Right 08/31/2012   Procedure: PERCUTANEOUS CORONARY STENT INTERVENTION (PCI-S);  Surgeon: Burnell Blanks, MD;  Location: [facility] CATH LAB;  Service: Cardiovascular;  Laterality: Right;  . PERCUTANEOUS CORONARY STENT  INTERVENTION (PCI-S)  11/16/2013   Procedure: PERCUTANEOUS CORONARY STENT INTERVENTION (PCI-S);  Surgeon: Blane Ohara, MD;  Location: [facility] CATH LAB;  Service: Cardiovascular;;    reports that he has quit smoking. His smoking use included cigarettes. He has a 15.00 pack-year smoking history. He has never used smokeless tobacco. He reports that he does not drink alcohol or use drugs. family history includes Cancer in his mother; Coronary artery disease in his unknown relative; Depression in his mother; Hypertension in his mother; Other in his  father. Allergies  Allergen Reactions  . Penicillins Other (See Comments)    Unknown childhood reaction   Current Outpatient Medications on File Prior to Visit  Medication Sig Dispense Refill  . albuterol (PROVENTIL HFA;VENTOLIN HFA) 108 (90 Base) MCG/ACT inhaler Inhale 2 puffs into the lungs every 6 (six) hours as needed for wheezing or shortness of breath. 1 Inhaler 11  . aspirin 81 MG tablet Take 81 mg by mouth daily.    . cetirizine (ZYRTEC) 10 MG tablet TAKE 1 TABLET(10 MG) BY MOUTH DAILY 30 tablet 2  . clopidogrel (PLAVIX) 75 MG tablet Take 1 tablet (75 mg total) by mouth daily. *Please call and schedule an appointment* 90 tablet 0  . escitalopram (LEXAPRO) 20 MG tablet TAKE 1 TABLET(20 MG) BY MOUTH DAILY 90 tablet 0  . furosemide (LASIX) 40 MG tablet SEE NOTES (Patient taking differently: Take 40-80 mg by mouth 2 (two) times daily. Take 80 mg in the morning , take 40 mg in the evening  Take an extra 40 mg tablet as needed for weight for gain 3 lbs or more) 360 tablet 1  . glipiZIDE (GLUCOTROL XL) 2.5 MG 24 hr tablet TAKE 1 TABLET(2.5 MG) BY MOUTH DAILY WITH BREAKFAST 90 tablet 0  . isosorbide mononitrate (IMDUR) 30 MG 24 hr tablet TAKE 3 TABLETS(90 MG) BY MOUTH DAILY 270 tablet 0  . lactulose (CHRONULAC) 10 GM/15ML solution TAKE 30 ML BY MOUTH TWICE DAILY AS NEEDED 6081 mL 1  . losartan (COZAAR) 100 MG tablet TAKE 1 TABLET(100 MG) BY MOUTH DAILY 90 tablet 0  . Multiple Vitamins-Minerals (MULTIVITAMIN WITH MINERALS) tablet Take 1 tablet by mouth daily. CENTRUM SILVER MENS  50 PLUS    . nitroGLYCERIN (NITROSTAT) 0.4 MG SL tablet PLACE 1 TABLET UNDER THE TONGUE EVERY 5 MINUTES AS NEEDED FOR CHEST PAIN 25 tablet 5  . pantoprazole (PROTONIX) 40 MG tablet TAKE 1 TABLET(40 MG) BY MOUTH DAILY 90 tablet 0  . potassium chloride (K-DUR) 10 MEQ tablet TAKE 2 TABLETS BY MOUTH EVERY MORNING AND 1 TABLET BY MOUTH EVERY EVENING 90 tablet 0  . rosuvastatin (CRESTOR) 40 MG tablet Take 1 tablet (40 mg  total) by mouth daily. 90 tablet 3  . triamcinolone (NASACORT) 55 MCG/ACT AERO nasal inhaler Place 2 sprays into the nose daily. 1 Inhaler 12   No current facility-administered medications on file prior to visit.    Review of Systems  Constitutional: Negative for other unusual diaphoresis or sweats HENT: Negative for ear discharge or swelling Eyes: Negative for other worsening visual disturbances Respiratory: Negative for stridor or other swelling  Gastrointestinal: Negative for worsening distension or other blood Genitourinary: Negative for retention or other urinary change Musculoskeletal: Negative for other MSK pain or swelling Skin: Negative for color change or other new lesions Neurological: Negative for worsening tremors and other numbness  Psychiatric/Behavioral: Negative for worsening agitation or other fatigue All other system neg per pt    Objective:   Physical Exam BP [name])  146/88   Pulse 74   Temp 98.7 F (37.1 C) (Oral)   Ht 6\' 1"  (1.854 m)   Wt (!) 318 lb (144.2 kg)   SpO2 93%   BMI 41.96 kg/m  VS noted,  Constitutional: Pt appears in NAD HENT: Head: NCAT.  Right Ear: External ear normal.  Left Ear: External ear normal.  Eyes: . Pupils are equal, round, and reactive to light. Conjunctivae and EOM are normal Nose: without d/c or deformity Neck: Neck supple. Gross normal ROM Cardiovascular: Normal rate and regular rhythm.   Pulmonary/Chest: Effort normal and breath sounds without rales or wheezing.  Abd:  Soft, NT, ND, + BS, no organomegaly Neurological: Pt is alert. At baseline orientation, motor grossly intact Skin: Skin is warm. No rashes, other new lesions, no LE edema Psychiatric: Pt behavior is normal without agitation  No other exam findings  Lab Results  Component Value Date   WBC 7.2 05/30/2017   HGB 14.9 05/30/2017   HCT 45.2 05/30/2017   PLT 192 05/30/2017   GLUCOSE 165 (H) 05/31/2017   CHOL 123 05/30/2017   TRIG 57 05/30/2017   HDL 44  05/30/2017   LDLCALC 68 05/30/2017   ALT 12 05/14/2017   AST 15 05/14/2017   NA 139 05/31/2017   K 3.2 (L) 05/31/2017   CL 108 05/31/2017   CREATININE 1.36 (H) 05/31/2017   BUN 16 05/31/2017   CO2 24 05/31/2017   TSH 1.67 05/14/2017   PSA 0.83 05/14/2017   INR 1.00 05/29/2017   HGBA1C 6.3 05/14/2017   MICROALBUR 49.0 (H) 05/14/2017      Assessment & Plan:

## 2018-05-05 NOTE — Assessment & Plan Note (Signed)
stable overall by history and exam, recent data reviewed with pt, and pt to continue medical treatment as before,  to f/u any worsening symptoms or concerns  

## 2018-05-05 NOTE — Assessment & Plan Note (Signed)
stable overall by history and exam, recent data reviewed with pt, and pt to continue medical treatment as before,  to f/u any worsening symptoms or concerns, for f/u lab 

## 2018-05-05 NOTE — Patient Instructions (Signed)
OK to continue the clonidine at 0.3 mg twice per day as you have  OK to increase the coreg (carvedilol) to 12.5 mg twice per day  Please continue all other medications as before, and refills have been done if requested - tramadol  Please have the pharmacy call with any other refills you may need.  Please continue your efforts at being more active, low cholesterol diet, and weight control.  Please keep your appointments with your specialists as you may have planned  You will be contacted regarding the referral for: cardiology and pulmonary  Please go to the LAB in the Basement (turn left off the elevator) for the tests to be done next Monday as you requested  You will be contacted by phone if any changes need to be made immediately.  Otherwise, you will receive a letter about your results with an explanation, but please check with MyChart first.  Please remember to sign up for MyChart if you have not done so, as this will be important to you in the future with finding out test results, communicating by private email, and scheduling acute appointments online when needed.  Please return in 6 months, or sooner if needed, with Lab testing done 3-5 days before

## 2018-05-05 NOTE — Assessment & Plan Note (Signed)
stable overall by history and exam, recent data reviewed with pt, and pt to continue medical treatment as before,  to f/u any worsening symptoms or concerns, for tramadol refill 

## 2018-05-12 ENCOUNTER — Other Ambulatory Visit (INDEPENDENT_AMBULATORY_CARE_PROVIDER_SITE_OTHER): Payer: Medicare HMO

## 2018-05-12 DIAGNOSIS — E1159 Type 2 diabetes mellitus with other circulatory complications: Secondary | ICD-10-CM

## 2018-05-12 LAB — HEPATIC FUNCTION PANEL
ALT: 17 U/L (ref 0–53)
AST: 16 U/L (ref 0–37)
Albumin: 4.1 g/dL (ref 3.5–5.2)
Alkaline Phosphatase: 73 U/L (ref 39–117)
BILIRUBIN DIRECT: 0.1 mg/dL (ref 0.0–0.3)
BILIRUBIN TOTAL: 0.6 mg/dL (ref 0.2–1.2)
TOTAL PROTEIN: 7.5 g/dL (ref 6.0–8.3)

## 2018-05-12 LAB — BASIC METABOLIC PANEL
BUN: 21 mg/dL (ref 6–23)
CHLORIDE: 107 meq/L (ref 96–112)
CO2: 27 meq/L (ref 19–32)
CREATININE: 1.65 mg/dL — AB (ref 0.40–1.50)
Calcium: 9.6 mg/dL (ref 8.4–10.5)
GFR: 54.77 mL/min — ABNORMAL LOW (ref >60.00)
GLUCOSE: 120 mg/dL — AB (ref 70–99)
Potassium: 3.9 mEq/L (ref 3.5–5.1)
Sodium: 144 mEq/L (ref 135–145)

## 2018-05-12 LAB — LIPID PANEL
CHOL/HDL RATIO: 3
CHOLESTEROL: 127 mg/dL (ref 0–200)
HDL: 42.5 mg/dL (ref >39.00)
LDL CALC: 73 mg/dL (ref 0–99)
NonHDL: 84.04
Triglycerides: 56 mg/dL (ref 0.0–149.0)
VLDL: 11.2 mg/dL (ref 0.0–40.0)

## 2018-05-12 LAB — HEMOGLOBIN A1C: HEMOGLOBIN A1C: 6.9 % — AB (ref 4.6–6.5)

## 2018-05-14 ENCOUNTER — Encounter: Payer: Self-pay | Admitting: Podiatry

## 2018-05-14 ENCOUNTER — Ambulatory Visit (INDEPENDENT_AMBULATORY_CARE_PROVIDER_SITE_OTHER): Payer: Medicare HMO | Admitting: Podiatry

## 2018-05-14 DIAGNOSIS — M79675 Pain in left toe(s): Secondary | ICD-10-CM

## 2018-05-14 DIAGNOSIS — B351 Tinea unguium: Secondary | ICD-10-CM | POA: Diagnosis not present

## 2018-05-14 DIAGNOSIS — E1142 Type 2 diabetes mellitus with diabetic polyneuropathy: Secondary | ICD-10-CM | POA: Diagnosis not present

## 2018-05-14 DIAGNOSIS — L84 Corns and callosities: Secondary | ICD-10-CM | POA: Diagnosis not present

## 2018-05-14 DIAGNOSIS — M79674 Pain in right toe(s): Secondary | ICD-10-CM

## 2018-05-14 NOTE — Patient Instructions (Signed)

## 2018-05-22 ENCOUNTER — Encounter: Payer: Self-pay | Admitting: Family Medicine

## 2018-05-22 ENCOUNTER — Ambulatory Visit (INDEPENDENT_AMBULATORY_CARE_PROVIDER_SITE_OTHER): Payer: Medicare HMO | Admitting: Family Medicine

## 2018-05-22 ENCOUNTER — Ambulatory Visit (INDEPENDENT_AMBULATORY_CARE_PROVIDER_SITE_OTHER)
Admission: RE | Admit: 2018-05-22 | Discharge: 2018-05-22 | Disposition: A | Discharge status: Home / Self Care | Payer: Medicare HMO | Source: Ambulatory Visit | Attending: Family Medicine | Admitting: Family Medicine

## 2018-05-22 VITALS — BP 160/110 | HR 74 | Ht 73.0 in | Wt 330.6 lb

## 2018-05-22 DIAGNOSIS — M4726 Other spondylosis with radiculopathy, lumbar region: Secondary | ICD-10-CM | POA: Diagnosis not present

## 2018-05-22 DIAGNOSIS — M25552 Pain in left hip: Secondary | ICD-10-CM

## 2018-05-22 DIAGNOSIS — M5416 Radiculopathy, lumbar region: Secondary | ICD-10-CM | POA: Diagnosis not present

## 2018-05-22 MED ORDER — DICLOFENAC SODIUM 1 % TD GEL: 2.0000 g | Freq: Four times a day (QID) | TRANSDERMAL | 11 refills | Status: DC

## 2018-05-22 NOTE — Assessment & Plan Note (Signed)
History of a left sided lumbar radiculopathy.  Used to actually respond fairly well to the injections.  Having some mild positive straight leg test but concern for decreased range of motion of the hip and possible arthritis.  X-rays pending.  New exercises given today.  Discussed icing regimen and home exercises.  Patient encouraged to continue the gabapentin.  Unable to do steroids secondary to patient's uncontrolled diabetes.  Encourage patient that this would likely help him though otherwise.  Patient declined formal physical therapy at this point.  Follow-up with me again in 4 weeks

## 2018-05-22 NOTE — Patient Instructions (Signed)
Good to see you  Xrays downstairs Vimovo 1 pill 2 times a day for 3 days Voltaren gel 2 times a day to side of hip and back  Exercises 3 times a week.  I will write you when I look at the xray  See me again in 4 weeks

## 2018-05-22 NOTE — Progress Notes (Signed)
[name] [address] [place], [place] [address] Phone: [phone] Subjective:    I'm seeing this patient by the request  of:  Biagio Borg, MD  I, Wendy Poet PT, LAT, ATC, am serving as scribe for [employer], DO  CC: Chronic low back pain  YQI:[id number]  Fernand Sorbello is a 62 y.o. male coming in with complaint of low back pain.  Pt states that he has been having low back pain x several years.  Pt states that his back pain flared up about 3-4 weeks ago when he was helping a friend move.  Pt states that he is having pain and N/T from his lower back into his L LE to the lower leg.  Pt states that he is having pain at night which is disturbing his sleep.  He is currently taking gabapentin and tramadol.  He has tried an Agricultural consultant but reports no improvement w/ these.  Pt had an L-spine MRI on 05/14/15.  Onset-  Location Duration-  Character- Aggravating factors- Reliving factors-  Therapies tried-  Severity-     Past Medical History:  Diagnosis Date  . CAD (coronary artery disease)    a. s/p PCI/BMS pRCA 2002; b. PCI pRCA & DES p/m RCA 2006; c. PCI/DES OM4 2007; d. PCI/CBA to RCA for ISR 10/2006; e.08/2012 STEMI/Cath/PCI:  LAD 95% >> PCI: 3.0x20 Promus Prem DES  //  f. NSTEMI 10/15 >> LHC: mLAD stent ok, dLAD 70, OM1 CTO, OM2 stent ok, dOM2 90, OM3 30-40, dLCx 90, p-mRCA stent ok, mRCA stent ok w/ 60-70 ISR, EF 50% >> med Rx  . Chronic combined systolic and diastolic CHF (congestive heart failure) ([place])    a. Myoview 1/17: EF 35%  //  b. Echo 1/17 EF 45-50%  . Depression   . Diabetes mellitus ([place])    a. A1c 8.8 08/2012->Metformin initiated. => b. A1c (9/14): 6.6  . GERD (gastroesophageal reflux disease)   . History of nuclear stress test    a. Myoview 1/17: EF 35%, fixed inferior lateral defect consistent with infarct, no ischemia, intermediate risk  . History of pneumonia   . HTN (hypertension)   . Hyperlipidemia   . Ischemic  cardiomyopathy    a. EF 40%; improved to normal;  b. 08/2012 Echo: EF 50-55%, mod LVH.//  c. Echo 9/16: Inf HK, mild LVH, EF 55%, mild LAE, normal RVF, mild RAE, PASP 35 mmHg  //  d. Echo 1/17: EF 45-50%, inferior HK, mild BAE, PASP 33 mmHg  . NSTEMI (non-ST elevated myocardial infarction) ([place])   . Obesity   . OSA (obstructive sleep apnea)    Does not use CPAP as of 05/2011  . Tobacco abuse    Past Surgical History:  Procedure Laterality Date  . CORONARY ANGIOPLASTY WITH STENT PLACEMENT    . CORONARY STENT PLACEMENT  2009  . LEFT HEART CATHETERIZATION WITH CORONARY ANGIOGRAM N/A 08/31/2012   Procedure: LEFT HEART CATHETERIZATION WITH CORONARY ANGIOGRAM;  Surgeon: Burnell Blanks, MD;  Location: [facility] CATH LAB;  Service: Cardiovascular;  Laterality: N/A;  . LEFT HEART CATHETERIZATION WITH CORONARY ANGIOGRAM N/A 11/16/2013   Procedure: LEFT HEART CATHETERIZATION WITH CORONARY ANGIOGRAM;  Surgeon: Blane Ohara, MD;  Location: [facility] CATH LAB;  Service: Cardiovascular;  Laterality: N/A;  . LEFT HEART CATHETERIZATION WITH CORONARY ANGIOGRAM N/A 03/15/2014   Procedure: LEFT HEART CATHETERIZATION WITH CORONARY ANGIOGRAM;  Surgeon: Peter M Martinique, MD;  Location: [facility] CATH LAB;  Service: Cardiovascular;  Laterality: N/A;  . PERCUTANEOUS CORONARY STENT INTERVENTION (PCI-S) Right 08/31/2012   Procedure: PERCUTANEOUS CORONARY STENT INTERVENTION (PCI-S);  Surgeon: Burnell Blanks, MD;  Location: [facility] CATH LAB;  Service: Cardiovascular;  Laterality: Right;  . PERCUTANEOUS CORONARY STENT INTERVENTION (PCI-S)  11/16/2013   Procedure: PERCUTANEOUS CORONARY STENT INTERVENTION (PCI-S);  Surgeon: Blane Ohara, MD;  Location: [facility] CATH LAB;  Service: Cardiovascular;;   Social History   Socioeconomic History  . Marital status: Single    Spouse name: Not on file  . Number of children: 0  . Years of education: Not on file  . Highest education level: Not on file  Occupational History  . Occupation: retired    Scientific laboratory technician  . Financial resource strain: Not on file  . Food insecurity:    Worry: Not on file    Inability: Not on file  . Transportation needs:    Medical: Not on file    Non-medical: Not on file  Tobacco Use  . Smoking status: Former Smoker    Packs/day: 0.50    Years: 30.00    Pack years: 15.00    Types: Cigarettes  . Smokeless tobacco: Never Used  . Tobacco comment: trying to cut down  Substance and Sexual Activity  . Alcohol use: No  . Drug use: No    Comment: remote marijuana use  . Sexual activity: Not Currently  Lifestyle  . Physical activity:    Days per week: Not on file    Minutes per session: Not on file  . Stress: Not on file  Relationships  . Social connections:    Talks on phone: Not on file    Gets together: Not on file    Attends religious service: Not on file    Active member of club or organization: Not on file    Attends meetings of clubs or organizations: Not on file    Relationship status: Not on file  Other Topics Concern  . Not on file  Social History Narrative   Lives alone.  He has no children.   He retired early due to health problems.         Allergies  Allergen Reactions  . Penicillins Other (See Comments)    Unknown childhood reaction   Family History  Problem Relation Age of Onset  . Depression Mother   . Cancer Mother        ovarian  . Hypertension Mother        Died, 30  . Other Father        motor vehicle accident  . Coronary artery disease Unknown   . Heart attack Neg Hx   . Stroke Neg Hx     Current Outpatient Medications (Endocrine & Metabolic):  .  glipiZIDE (GLUCOTROL XL) 2.5 MG 24 hr tablet, TAKE 1 TABLET(2.5 MG) BY MOUTH DAILY WITH BREAKFAST  Current Outpatient Medications (Cardiovascular):  .  carvedilol (COREG) 12.5 MG tablet, Take 1 tablet (12.5 mg total) by mouth 2 (two) times daily with a meal. .  cloNIDine (CATAPRES) 0.3 MG tablet, Take 1 tablet (0.3 mg total) by mouth 2 (two) times daily. .   furosemide (LASIX) 40 MG tablet, SEE NOTES (Patient taking differently: Take 40-80 mg by mouth 2 (two) times daily. Take 80 mg in the morning , take 40 mg in the evening  Take an extra 40 mg tablet as needed for weight for gain 3 lbs or more) .  isosorbide mononitrate (IMDUR) 30 MG 24 hr tablet, TAKE  3 TABLETS(90 MG) BY MOUTH DAILY .  losartan (COZAAR) 100 MG tablet, TAKE 1 TABLET(100 MG) BY MOUTH DAILY .  nitroGLYCERIN (NITROSTAT) 0.4 MG SL tablet, PLACE 1 TABLET UNDER THE TONGUE EVERY 5 MINUTES AS NEEDED FOR CHEST PAIN .  rosuvastatin (CRESTOR) 40 MG tablet, Take 1 tablet (40 mg total) by mouth daily.  Current Outpatient Medications (Respiratory):  .  albuterol (PROVENTIL HFA;VENTOLIN HFA) 108 (90 Base) MCG/ACT inhaler, Inhale 2 puffs into the lungs every 6 (six) hours as needed for wheezing or shortness of breath. .  cetirizine (ZYRTEC) 10 MG tablet, TAKE 1 TABLET(10 MG) BY MOUTH DAILY .  triamcinolone (NASACORT) 55 MCG/ACT AERO nasal inhaler, Place 2 sprays into the nose daily.  Current Outpatient Medications (Analgesics):  .  aspirin 81 MG tablet, Take 81 mg by mouth daily. .  traMADol (ULTRAM) 50 MG tablet, TAKE 1 TABLET(50 MG) BY MOUTH EVERY 6 HOURS AS NEEDED  Current Outpatient Medications (Hematological):  .  clopidogrel (PLAVIX) 75 MG tablet, Take 1 tablet (75 mg total) by mouth daily. *Please call and schedule an appointment*  Current Outpatient Medications (Other):  .  escitalopram (LEXAPRO) 20 MG tablet, TAKE 1 TABLET(20 MG) BY MOUTH DAILY .  gabapentin (NEURONTIN) 300 MG capsule, Take 1 capsule (300 mg total) by mouth 3 (three) times daily. [name]  lactulose (CHRONULAC) 10 GM/15ML solution, TAKE 30 ML BY MOUTH TWICE DAILY AS NEEDED .  Multiple Vitamins-Minerals (MULTIVITAMIN WITH MINERALS) tablet, Take 1 tablet by mouth daily. CENTRUM SILVER MENS  50 PLUS .  pantoprazole (PROTONIX) 40 MG tablet, TAKE 1 TABLET(40 MG) BY MOUTH DAILY .  potassium chloride (K-DUR) 10 MEQ tablet, TAKE 2  TABLETS BY MOUTH EVERY MORNING AND 1 TABLET BY MOUTH EVERY EVENING .  diclofenac sodium (VOLTAREN) 1 % GEL, Apply 2 g topically 4 (four) times daily. To affected joint.    Past medical history, social, surgical and family history all reviewed in electronic medical record.  No pertanent information unless stated regarding to the chief complaint.   Review of Systems:  No headache, visual changes, nausea, vomiting, diarrhea, constipation, dizziness, abdominal pain, skin rash, fevers, chills, night sweats, weight loss, swollen lymph nodes, body aches, joint swelling,, chest pain, shortness of breath, mood changes.  Positive muscle aches  Objective  Blood pressure (!) 160/110, pulse 74, height 6\' 1"  (1.854 m), weight (!) 330 lb 9.6 oz (150 kg), SpO2 94 %.   General: No apparent distress alert and oriented x3 mood and affect normal, dressed appropriately.  HEENT: Pupils equal, extraocular movements intact  Respiratory: Patient's speak in full sentences and does not appear short of breath  Cardiovascular: 1+ lower extremity edema, non tender, no erythema  Skin: Warm dry intact with no signs of infection or rash on extremities or on axial skeleton.  Abdomen: Soft nontender morbid obesity Neuro: Cranial nerves II through XII are intact, neurovascularly intact in all extremities with 2+ DTRs and 2+ pulses.  Lymph: No lymphadenopathy of posterior or anterior cervical chain or axillae bilaterally.  Gait antalgic MSK:  Non tender with full range of motion and good stability and symmetric strength and tone of shoulders, elbows, wrist,  knee and ankles bilaterally.  Left hip does have decreased range of motion in all planes.  Patient does have a mild positive straight leg test.  Patient has only 5 degrees of internal range of motion that does cause more groin pain.  Patient is mildly tender to palpation on the lateral aspect of the hip as  well as over the left sacroiliac joint.    [id number]; 15 additional  minutes spent for Therapeutic exercises as stated in above notes.  This included exercises focusing on stretching, strengthening, with significant focus on eccentric aspects.   Long term goals include an improvement in range of motion, strength, endurance as well as avoiding reinjury. Patient's frequency would include in 1-2 times a day, 3-5 times a week for a duration of 6-12 weeks. Hip strengthening exercises which included:  Pelvic tilt/bracing to help with proper recruitment of the lower abs and pelvic floor muscles  Glute strengthening to properly contract glutes without over-engaging low back and hamstrings - prone hip extension and glute bridge exercises Proper stretching techniques to increase effectiveness for the hip flexors, groin, quads, piriformic and low back when appropriate    Proper technique shown and discussed handout in great detail with ATC.  All questions were discussed and answered.      Impression and Recommendations:     This case required medical decision making of moderate complexity. The above documentation has been reviewed and is accurate and complete Lyndal Pulley, DO       Note: This dictation was prepared with Dragon dictation along with smaller phrase technology. Any transcriptional errors that result from this process are unintentional.

## 2018-06-11 ENCOUNTER — Encounter: Payer: Self-pay | Admitting: Podiatry

## 2018-06-11 NOTE — Progress Notes (Signed)
Subjective: [name] Amble presents with diabetes, diabetic neuropathy and cc of painful, discolored, thick toenails and painful calluses which interfere with activities of daily living. Pain is aggravated when wearing enclosed shoe gear. Pain is relieved with periodic professional debridement.  Biagio Borg, MD is his PCP and last dos was 05/05/2018.   Current Outpatient Medications:  .  albuterol (PROVENTIL HFA;VENTOLIN HFA) 108 (90 Base) MCG/ACT inhaler, Inhale 2 puffs into the lungs every 6 (six) hours as needed for wheezing or shortness of breath., Disp: 1 Inhaler, Rfl: 11 .  aspirin 81 MG tablet, Take 81 mg by mouth daily., Disp: , Rfl:  .  carvedilol (COREG) 12.5 MG tablet, Take 1 tablet (12.5 mg total) by mouth 2 (two) times daily with a meal., Disp: 180 tablet, Rfl: 3 .  cetirizine (ZYRTEC) 10 MG tablet, TAKE 1 TABLET(10 MG) BY MOUTH DAILY, Disp: 30 tablet, Rfl: 2 .  cloNIDine (CATAPRES) 0.3 MG tablet, Take 1 tablet (0.3 mg total) by mouth 2 (two) times daily., Disp: 180 tablet, Rfl: 3 .  clopidogrel (PLAVIX) 75 MG tablet, Take 1 tablet (75 mg total) by mouth daily. *Please call and schedule an appointment*, Disp: 90 tablet, Rfl: 0 .  escitalopram (LEXAPRO) 20 MG tablet, TAKE 1 TABLET(20 MG) BY MOUTH DAILY, Disp: 90 tablet, Rfl: 0 .  furosemide (LASIX) 40 MG tablet, SEE NOTES (Patient taking differently: Take 40-80 mg by mouth 2 (two) times daily. Take 80 mg in the morning , take 40 mg in the evening  Take an extra 40 mg tablet as needed for weight for gain 3 lbs or more), Disp: 360 tablet, Rfl: 1 .  gabapentin (NEURONTIN) 300 MG capsule, Take 1 capsule (300 mg total) by mouth 3 (three) times daily., Disp: 270 capsule, Rfl: 1 .  glipiZIDE (GLUCOTROL XL) 2.5 MG 24 hr tablet, TAKE 1 TABLET(2.5 MG) BY MOUTH DAILY WITH BREAKFAST, Disp: 90 tablet, Rfl: 0 .  isosorbide mononitrate (IMDUR) 30 MG 24 hr tablet, TAKE 3 TABLETS(90 MG) BY MOUTH DAILY, Disp: 270 tablet, Rfl: 0 .  lactulose (CHRONULAC)  10 GM/15ML solution, TAKE 30 ML BY MOUTH TWICE DAILY AS NEEDED, Disp: 6081 mL, Rfl: 1 .  losartan (COZAAR) 100 MG tablet, TAKE 1 TABLET(100 MG) BY MOUTH DAILY, Disp: 90 tablet, Rfl: 0 .  Multiple Vitamins-Minerals (MULTIVITAMIN WITH MINERALS) tablet, Take 1 tablet by mouth daily. CENTRUM SILVER MENS  50 PLUS, Disp: , Rfl:  .  nitroGLYCERIN (NITROSTAT) 0.4 MG SL tablet, PLACE 1 TABLET UNDER THE TONGUE EVERY 5 MINUTES AS NEEDED FOR CHEST PAIN, Disp: 25 tablet, Rfl: 5 .  pantoprazole (PROTONIX) 40 MG tablet, TAKE 1 TABLET(40 MG) BY MOUTH DAILY, Disp: 90 tablet, Rfl: 0 .  potassium chloride (K-DUR) 10 MEQ tablet, TAKE 2 TABLETS BY MOUTH EVERY MORNING AND 1 TABLET BY MOUTH EVERY EVENING, Disp: 90 tablet, Rfl: 0 .  rosuvastatin (CRESTOR) 40 MG tablet, Take 1 tablet (40 mg total) by mouth daily., Disp: 90 tablet, Rfl: 3 .  traMADol (ULTRAM) 50 MG tablet, TAKE 1 TABLET(50 MG) BY MOUTH EVERY 6 HOURS AS NEEDED, Disp: 120 tablet, Rfl: 5 .  triamcinolone (NASACORT) 55 MCG/ACT AERO nasal inhaler, Place 2 sprays into the nose daily., Disp: 1 Inhaler, Rfl: 12 .  diclofenac sodium (VOLTAREN) 1 % GEL, Apply 2 g topically 4 (four) times daily. To affected joint., Disp: 100 g, Rfl: 11  Allergies  Allergen Reactions  . Penicillins Other (See Comments)    Unknown childhood reaction    Vascular Examination:  Capillary refill time <3 seconds x 10 digits Dorsalis pedis and Posterior tibial pulses present b/l No digital hair x 10 digits Skin temperature WNL b/l  Dermatological Examination: Skin with normal turgor, texture and tone b/l  Toenails 1-5 b/l discolored, thick, dystrophic with subungual debris and pain with palpation to nailbeds due to thickness of nails. Cracked nailplate noted lateral aspect of left 4th digit. No erythema, no edema, no drainage.  Hyperkeratotic lesion submetatarsal head 2, 5 b/l and distal tip of left 2nd toe.  Musculoskeletal: Muscle strength 5/5 to all LE muscle  groups  Neurological: Sensation diminished with 10 gram monofilament.  Vibratory sensation diminished   Assessment: 1. Painful onychomycosis toenails 1-5 b/l Calluses b/l feet submetatarsal head 2, 5 b/l and distal tip of left 2nd toe. 2. NIDDM with Diabetic neuropathy  Plan: 1. Continue diabetic foot care principles.  2. Toenails 1-5 b/l were debrided in length and girth without iatrogenic bleeding. Cracked nailplate debrided and smoothed evenly with remaining attached nail. 3. Hyperkeratotic lesion pared with sterile chisel blade submetatarsal head 2, 5 b/l and distal tip of left 2nd toe. 4. Patient to continue soft, supportive shoe gear 5. Patient to report any pedal injuries to medical professional  6. Follow up 3 months. Patient/POA to call should there be a concern in the interim.

## 2018-06-23 NOTE — Progress Notes (Deleted)
[name] [address], [place] [address] Phone: [phone] Subjective:     CC: Back and hip pain follow-up  [id number]  [name] [name] is a 63 y.o. male coming in with complaint of ***  Onset-  Location Duration-  Character- Aggravating factors- Reliving factors-  Therapies tried-  Severity-   Patient did have back x-rays done.  Back x-rays show asymmetric facet arthropathy from L3-L5 x-rays of the left hip show mild osteoarthritic changes.  These were independently visualized by me  Past Medical History:  Diagnosis Date  . CAD (coronary artery disease)    a. s/p PCI/BMS pRCA 2002; b. PCI pRCA & DES p/m RCA 2006; c. PCI/DES OM4 2007; d. PCI/CBA to RCA for ISR 10/2006; e.08/2012 STEMI/Cath/PCI:  LAD 95% >> PCI: 3.0x20 Promus Prem DES  //  f. NSTEMI 10/15 >> LHC: mLAD stent ok, dLAD 70, OM1 CTO, OM2 stent ok, dOM2 90, OM3 30-40, dLCx 90, p-mRCA stent ok, mRCA stent ok w/ 60-70 ISR, EF 50% >> med Rx  . Chronic combined systolic and diastolic CHF (congestive heart failure) ([place])    a. Myoview 1/17: EF 35%  //  b. Echo 1/17 EF 45-50%  . Depression   . Diabetes mellitus ([place])    a. A1c 8.8 08/2012->Metformin initiated. => b. A1c (9/14): 6.6  . GERD (gastroesophageal reflux disease)   . History of nuclear stress test    a. Myoview 1/17: EF 35%, fixed inferior lateral defect consistent with infarct, no ischemia, intermediate risk  . History of pneumonia   . HTN (hypertension)   . Hyperlipidemia   . Ischemic cardiomyopathy    a. EF 40%; improved to normal;  b. 08/2012 Echo: EF 50-55%, mod LVH.//  c. Echo 9/16: Inf HK, mild LVH, EF 55%, mild LAE, normal RVF, mild RAE, PASP 35 mmHg  //  d. Echo 1/17: EF 45-50%, inferior HK, mild BAE, PASP 33 mmHg  . NSTEMI (non-ST elevated myocardial infarction) ([place])   . Obesity   . OSA (obstructive sleep apnea)    Does not use CPAP as of 05/2011  . Tobacco abuse    Past Surgical History:  Procedure  Laterality Date  . CORONARY ANGIOPLASTY WITH STENT PLACEMENT    . CORONARY STENT PLACEMENT  2009  . LEFT HEART CATHETERIZATION WITH CORONARY ANGIOGRAM N/A 08/31/2012   Procedure: LEFT HEART CATHETERIZATION WITH CORONARY ANGIOGRAM;  Surgeon: Burnell Blanks, MD;  Location[facility] C[facility] LAB;  Service: Cardiovascular;  Laterality: N/A;  . LEFT HEART CATHETERIZATION WITH CORONARY ANGIOGRAM N/A 11/16/2013   Procedure: LEFT HEART CATHETERIZATION WITH CORONARY ANGIOGRAM;  Surgeon: Blane Ohara, MD;  Location: [facility] CATH LAB;  Service: Cardiovascular;  Laterality: N/A;  . LEFT HEART CATHETERIZATION WITH CORONARY ANGIOGRAM N/A 03/15/2014   Procedure: LEFT HEART CATHETERIZATION WITH CORONARY ANGIOGRAM;  Surgeon: Peter M Martinique, MD;  Location: [facility] CATH LAB;  Service: Cardiovascular;  Laterality: N/A;  . PERCUTANEOUS CORONARY STENT INTERVENTION (PCI-S) Right 08/31/2012   Procedure: PERCUTANEOUS CORONARY STENT INTERVENTION (PCI-S);  Surgeon: Burnell Blanks, MD;  Location: [facility] CATH LAB;  Service: Cardiovascular;  Laterality: Right;  . PERCUTANEOUS CORONARY STENT INTERVENTION (PCI-S)  11/16/2013   Procedure: PERCUTANEOUS CORONARY STENT INTERVENTION (PCI-S);  Surgeon: Blane Ohara, MD;  Location: [facility] CATH LAB;  Service: Cardiovascular;;   Social History   Socioeconomic History  . Marital status: Single    Spouse name: Not on file  . Number of children: 0  . Years of education: Not  on file  . Highest education level: Not on file  Occupational History  . Occupation: retired  Scientific laboratory technician  . Financial resource strain: Not on file  . Food insecurity:    Worry: Not on file    Inability: Not on file  . Transportation needs:    Medical: Not on file    Non-medical: Not on file  Tobacco Use  . Smoking status: Former Smoker    Packs/day: 0.50    Years: 30.00    Pack years: 15.00    Types: Cigarettes  . Smokeless tobacco: Never Used  . Tobacco comment: trying to cut down  Substance and Sexual  Activity  . Alcohol use: No  . Drug use: No    Comment: remote marijuana use  . Sexual activity: Not Currently  Lifestyle  . Physical activity:    Days per week: Not on file    Minutes per session: Not on file  . Stress: Not on file  Relationships  . Social connections:    Talks on phone: Not on file    Gets together: Not on file    Attends religious service: Not on file    Active member of club or organization: Not on file    Attends meetings of clubs or organizations: Not on file    Relationship status: Not on file  Other Topics Concern  . Not on file  Social History Narrative   Lives alone.  He has no children.   He retired early due to health problems.         Allergies  Allergen Reactions  . Penicillins Other (See Comments)    Unknown childhood reaction   Family History  Problem Relation Age of Onset  . Depression Mother   . Cancer Mother        ovarian  . Hypertension Mother        Died, 31  . Other Father        motor vehicle accident  . Coronary artery disease Unknown   . Heart attack Neg Hx   . Stroke Neg Hx     Current Outpatient Medications (Endocrine & Metabolic):  .  glipiZIDE (GLUCOTROL XL) 2.5 MG 24 hr tablet, TAKE 1 TABLET(2.5 MG) BY MOUTH DAILY WITH BREAKFAST  Current Outpatient Medications (Cardiovascular):  .  carvedilol (COREG) 12.5 MG tablet, Take 1 tablet (12.5 mg total) by mouth 2 (two) times daily with a meal. .  cloNIDine (CATAPRES) 0.3 MG tablet, Take 1 tablet (0.3 mg total) by mouth 2 (two) times daily. .  furosemide (LASIX) 40 MG tablet, SEE NOTES (Patient taking differently: Take 40-80 mg by mouth 2 (two) times daily. Take 80 mg in the morning , take 40 mg in the evening  Take an extra 40 mg tablet as needed for weight for gain 3 lbs or more) .  isosorbide mononitrate (IMDUR) 30 MG 24 hr tablet, TAKE 3 TABLETS(90 MG) BY MOUTH DAILY .  losartan (COZAAR) 100 MG tablet, TAKE 1 TABLET(100 MG) BY MOUTH DAILY .  nitroGLYCERIN (NITROSTAT)  0.4 MG SL tablet, PLACE 1 TABLET UNDER THE TONGUE EVERY 5 MINUTES AS NEEDED FOR CHEST PAIN .  rosuvastatin (CRESTOR) 40 MG tablet, Take 1 tablet (40 mg total) by mouth daily.  Current Outpatient Medications (Respiratory):  .  albuterol (PROVENTIL HFA;VENTOLIN HFA) 108 (90 Base) MCG/ACT inhaler, Inhale 2 puffs into the lungs every 6 (six) hours as needed for wheezing or shortness of breath. .  cetirizine (ZYRTEC) 10 MG tablet, TAKE  1 TABLET(10 MG) BY MOUTH DAILY .  triamcinolone (NASACORT) 55 MCG/ACT AERO nasal inhaler, Place 2 sprays into the nose daily.  Current Outpatient Medications (Analgesics):  .  aspirin 81 MG tablet, Take 81 mg by mouth daily. .  traMADol (ULTRAM) 50 MG tablet, TAKE 1 TABLET(50 MG) BY MOUTH EVERY 6 HOURS AS NEEDED  Current Outpatient Medications (Hematological):  .  clopidogrel (PLAVIX) 75 MG tablet, Take 1 tablet (75 mg total) by mouth daily. *Please call and schedule an appointment*  Current Outpatient Medications (Other):  .  diclofenac sodium (VOLTAREN) 1 % GEL, Apply 2 g topically 4 (four) times daily. To affected joint. [name]  escitalopram (LEXAPRO) 20 MG tablet, TAKE 1 TABLET(20 MG) BY MOUTH DAILY .  gabapentin (NEURONTIN) 300 MG capsule, Take 1 capsule (300 mg total) by mouth 3 (three) times daily. [name]  lactulose (CHRONULAC) 10 GM/15ML solution, TAKE 30 ML BY MOUTH TWICE DAILY AS NEEDED .  Multiple Vitamins-Minerals (MULTIVITAMIN WITH MINERALS) tablet, Take 1 tablet by mouth daily. CENTRUM SILVER MENS  50 PLUS .  pantoprazole (PROTONIX) 40 MG tablet, TAKE 1 TABLET(40 MG) BY MOUTH DAILY .  potassium chloride (K-DUR) 10 MEQ tablet, TAKE 2 TABLETS BY MOUTH EVERY MORNING AND 1 TABLET BY MOUTH EVERY EVENING    Past medical history, social, surgical and family history all reviewed in electronic medical record.  No pertanent information unless stated regarding to the chief complaint.   Review of Systems:  No headache, visual changes, nausea, vomiting, diarrhea,  constipation, dizziness, abdominal pain, skin rash, fevers, chills, night sweats, weight loss, swollen lymph nodes, body aches, joint swelling, muscle aches, chest pain, shortness of breath, mood changes.   Objective  There were no vitals taken for this visit. Systems examined below as of    General: No apparent distress alert and oriented x3 mood and affect normal, dressed appropriately.  HEENT: Pupils equal, extraocular movements intact  Respiratory: Patient's speak in full sentences and does not appear short of breath  Cardiovascular: No lower extremity edema, non tender, no erythema  Skin: Warm dry intact with no signs of infection or rash on extremities or on axial skeleton.  Abdomen: Soft nontender  Neuro: Cranial nerves II through XII are intact, neurovascularly intact in all extremities with 2+ DTRs and 2+ pulses.  Lymph: No lymphadenopathy of posterior or anterior cervical chain or axillae bilaterally.  Gait normal with good balance and coordination.  MSK:  Non tender with full range of motion and good stability and symmetric strength and tone of shoulders, elbows, wrist, , knee and ankles bilaterally.  Back Exam:  Inspection: Unremarkable  Motion: Flexion 45 deg, Extension 45 deg, Side Bending to 45 deg bilaterally,  Rotation to 45 deg bilaterally  SLR laying: Negative  XSLR laying: Negative  Palpable tenderness: None. FABER: negative. Sensory change: Gross sensation intact to all lumbar and sacral dermatomes.  Reflexes: 2+ at both patellar tendons, 2+ at achilles tendons, Babinski's downgoing.  Strength at foot  Plantar-flexion: 5/5 Dorsi-flexion: 5/5 Eversion: 5/5 Inversion: 5/5  Leg strength  Quad: 5/5 Hamstring: 5/5 Hip flexor: 5/5 Hip abductors: 5/5  Gait unremarkable.   Impression and Recommendations:     This case required medical decision making of moderate complexity. The above documentation has been reviewed and is accurate and complete Lyndal Pulley,  DO       Note: This dictation was prepared with Dragon dictation along with smaller phrase technology. Any transcriptional errors that result from this process are unintentional.

## 2018-06-24 ENCOUNTER — Ambulatory Visit: Payer: Medicare HMO | Admitting: Family Medicine

## 2018-07-07 ENCOUNTER — Other Ambulatory Visit: Payer: Self-pay | Admitting: Internal Medicine

## 2018-07-08 ENCOUNTER — Encounter: Payer: Self-pay | Admitting: Internal Medicine

## 2018-07-17 ENCOUNTER — Other Ambulatory Visit: Payer: Self-pay | Admitting: Internal Medicine

## 2018-08-01 ENCOUNTER — Other Ambulatory Visit: Payer: Self-pay | Admitting: Internal Medicine

## 2018-08-01 DIAGNOSIS — I2510 Atherosclerotic heart disease of native coronary artery without angina pectoris: Secondary | ICD-10-CM

## 2018-08-13 ENCOUNTER — Ambulatory Visit: Payer: Medicare HMO | Admitting: Podiatry

## 2018-09-18 ENCOUNTER — Other Ambulatory Visit: Payer: Self-pay | Admitting: Internal Medicine

## 2018-10-25 ENCOUNTER — Other Ambulatory Visit: Payer: Self-pay | Admitting: Internal Medicine

## 2018-10-30 ENCOUNTER — Other Ambulatory Visit: Payer: Self-pay | Admitting: *Deleted

## 2018-10-30 MED ORDER — POTASSIUM CHLORIDE ER 10 MEQ PO TBCR: EXTENDED_RELEASE_TABLET | ORAL | 0 refills | Status: DC

## 2018-10-30 MED ORDER — CETIRIZINE HCL 10 MG PO TABS: ORAL_TABLET | ORAL | 2 refills | Status: DC

## 2018-11-04 ENCOUNTER — Other Ambulatory Visit: Payer: Self-pay | Admitting: Internal Medicine

## 2018-11-04 NOTE — Telephone Encounter (Signed)
Done erx 

## 2018-11-06 ENCOUNTER — Ambulatory Visit: Payer: Medicare HMO | Admitting: Internal Medicine

## 2018-11-26 ENCOUNTER — Other Ambulatory Visit: Payer: Self-pay

## 2018-11-26 ENCOUNTER — Ambulatory Visit (INDEPENDENT_AMBULATORY_CARE_PROVIDER_SITE_OTHER): Payer: Medicare HMO | Admitting: Internal Medicine

## 2018-11-26 ENCOUNTER — Encounter: Payer: Self-pay | Admitting: Internal Medicine

## 2018-11-26 DIAGNOSIS — S99922A Unspecified injury of left foot, initial encounter: Secondary | ICD-10-CM | POA: Diagnosis not present

## 2018-11-26 NOTE — Progress Notes (Signed)
Subjective:    Patient ID: [name] [name], male    DOB: May 20, 1956, 63 y.o.   MRN: [id number]  HPI The patient is here for an acute visit.   Foot injury:  He stepped on a large plug 3 days ago with his left foot and has a cut on the bottom of his left foot.  He has some pain.   He does have diabetic neuropathy in the left foot and was concerned about an infection.  He denies open wound or discharge.  He denies swelling or redness.   He denies any fever or chills.       Medications and allergies reviewed with patient and updated if appropriate.  Patient Active Problem List   Diagnosis Date Noted  . Chest pain 05/30/2017  . Hypokalemia 05/30/2017  . Allergic rhinitis 12/18/2016  . Lumbar radiculitis 09/05/2016  . Lumbar spinal stenosis 09/05/2016  . CKD (chronic kidney disease), stage III ([place]) 12/29/2015  . Chest pain syndrome 12/15/2015  . Acute kidney injury ([place]) 12/15/2015  . Atypical chest pain 12/15/2015  . Preventative health care 06/27/2015  . Chronic low back pain 06/27/2015  . Left lumbar radiculopathy 04/13/2015  . Bilateral plantar fasciitis 04/13/2015  . Essential hypertension 03/09/2015  . OSA (obstructive sleep apnea) 03/27/2014  . Peripheral neuropathy ([place]) 12/29/2013  . External hemorrhoid, thrombosed 12/03/2013  . Sinus bradycardia 11/15/2013  . NSTEMI (non-ST elevated myocardial infarction) ([place]) 11/14/2013  . Chronic combined systolic and diastolic CHF  91/47/8295  . CAD S/P multiple PCI   . Hyperlipidemia   . DM2 (diabetes mellitus, type 2) ([place]) 06/26/2013  . Gait disorder 06/26/2013  . Obesity, Class III, BMI 40-49.9 (morbid obesity) ([place]) 09/10/2012  . Tobacco abuse 09/02/2012  . Ischemic cardiomyopathy- EF 50-55% June 2015   . ST elevation myocardial infarction (STEMI) of anterior wall, initial episode of care ([place]) 09/01/2012  . HEMORRHOIDS 12/26/2009  . Acute prostatitis 12/26/2009  . GANGLION CYST, WRIST, RIGHT 11/28/2009  . COLONIC  POLYPS 10/07/2009  . DEPRESSION 07/21/2009  . DYSPNEA 03/24/2009  . GASTROESOPHAGEAL REFLUX DISEASE 01/27/2007  . PERCUTANEOUS TRANSLUMINAL CORONARY ANGIOPLASTY, HX OF 02/09/2006    Current Outpatient Medications on File Prior to Visit  Medication Sig Dispense Refill  . albuterol (PROVENTIL HFA;VENTOLIN HFA) 108 (90 Base) MCG/ACT inhaler Inhale 2 puffs into the lungs every 6 (six) hours as needed for wheezing or shortness of breath. 1 Inhaler 11  . aspirin 81 MG tablet Take 81 mg by mouth daily.    . carvedilol (COREG) 12.5 MG tablet Take 1 tablet (12.5 mg total) by mouth 2 (two) times daily with a meal. 180 tablet 3  . cetirizine (ZYRTEC) 10 MG tablet TAKE 1 TABLET(10 MG) BY MOUTH DAILY 30 tablet 2  . cloNIDine (CATAPRES) 0.3 MG tablet Take 1 tablet (0.3 mg total) by mouth 2 (two) times daily. 180 tablet 3  . clopidogrel (PLAVIX) 75 MG tablet TAKE 1 TABLET(75 MG) BY MOUTH DAILY 90 tablet 0  . diclofenac sodium (VOLTAREN) 1 % GEL Apply 2 g topically 4 (four) times daily. To affected joint. 100 g 11  . escitalopram (LEXAPRO) 20 MG tablet TAKE 1 TABLET(20 MG) BY MOUTH DAILY 90 tablet 0  . furosemide (LASIX) 40 MG tablet TAKE 2 TABLETS BY MOUTH EVERY MORNING AND 1 TABLET BY MOUTH EVERY EVENING. TAKE AN EXTRA TABLET AS NEEDED FOR WEIGHT GAIN OF 3LBS OR MORE 360 tablet 0  . gabapentin (NEURONTIN) 300 MG capsule Take 1 capsule (300 mg total)  by mouth 3 (three) times daily. 270 capsule 1  . glipiZIDE (GLUCOTROL XL) 2.5 MG 24 hr tablet TAKE 1 TABLET(2.5 MG) BY MOUTH DAILY WITH BREAKFAST 90 tablet 0  . isosorbide mononitrate (IMDUR) 30 MG 24 hr tablet TAKE 3 TABLETS(90 MG) BY MOUTH DAILY 270 tablet 0  . lactulose (CHRONULAC) 10 GM/15ML solution TAKE 30 ML BY MOUTH TWICE DAILY AS NEEDED 6081 mL 1  . losartan (COZAAR) 100 MG tablet TAKE 1 TABLET(100 MG) BY MOUTH DAILY 90 tablet 0  . Multiple Vitamins-Minerals (MULTIVITAMIN WITH MINERALS) tablet Take 1 tablet by mouth daily. CENTRUM SILVER MENS  50 PLUS     . nitroGLYCERIN (NITROSTAT) 0.4 MG SL tablet PLACE 1 TABLET UNDER THE TONGUE EVERY 5 MINUTES AS NEEDED FOR CHEST PAIN 25 tablet 5  . pantoprazole (PROTONIX) 40 MG tablet TAKE 1 TABLET(40 MG) BY MOUTH DAILY 90 tablet 0  . potassium chloride (K-DUR) 10 MEQ tablet TAKE 2 TABLETS BY MOUTH EVERY MORNING AND 1 TABLET BY MOUTH EVERY EVENING 90 tablet 0  . potassium chloride (K-DUR) 10 MEQ tablet TAKE 2 TABLETS BY MOUTH EVERY MORNING AND 1 TABLET BY MOUTH EVERY EVENING 90 tablet 0  . rosuvastatin (CRESTOR) 40 MG tablet Take 1 tablet (40 mg total) by mouth daily. 90 tablet 3  . traMADol (ULTRAM) 50 MG tablet TAKE 1 TABLET(50 MG) BY MOUTH EVERY 6 HOURS AS NEEDED 120 tablet 5  . triamcinolone (NASACORT) 55 MCG/ACT AERO nasal inhaler Place 2 sprays into the nose daily. 1 Inhaler 12   No current facility-administered medications on file prior to visit.     Past Medical History:  Diagnosis Date  . CAD (coronary artery disease)    a. s/p PCI/BMS pRCA 2002; b. PCI pRCA & DES p/m RCA 2006; c. PCI/DES OM4 2007; d. PCI/CBA to RCA for ISR 10/2006; e.08/2012 STEMI/Cath/PCI:  LAD 95% >> PCI: 3.0x20 Promus Prem DES  //  f. NSTEMI 10/15 >> LHC: mLAD stent ok, dLAD 70, OM1 CTO, OM2 stent ok, dOM2 90, OM3 30-40, dLCx 90, p-mRCA stent ok, mRCA stent ok w/ 60-70 ISR, EF 50% >> med Rx  . Chronic combined systolic and diastolic CHF (congestive heart failure) ([place])    a. Myoview 1/17: EF 35%  //  b. Echo 1/17 EF 45-50%  . Depression   . Diabetes mellitus ([place])    a. A1c 8.8 08/2012->Metformin initiated. => b. A1c (9/14): 6.6  . GERD (gastroesophageal reflux disease)   . History of nuclear stress test    a. Myoview 1/17: EF 35%, fixed inferior lateral defect consistent with infarct, no ischemia, intermediate risk  . History of pneumonia   . HTN (hypertension)   . Hyperlipidemia   . Ischemic cardiomyopathy    a. EF 40%; improved to normal;  b. 08/2012 Echo: EF 50-55%, mod LVH.//  c. Echo 9/16: Inf HK, mild LVH, EF 55%, mild  LAE, normal RVF, mild RAE, PASP 35 mmHg  //  d. Echo 1/17: EF 45-50%, inferior HK, mild BAE, PASP 33 mmHg  . NSTEMI (non-ST elevated myocardial infarction) ([place])   . Obesity   . OSA (obstructive sleep apnea)    Does not use CPAP as of 05/2011  . Tobacco abuse     Past Surgical History:  Procedure Laterality Date  . CORONARY ANGIOPLASTY WITH STENT PLACEMENT    . CORONARY STENT PLACEMENT  2009  . LEFT HEART CATHETERIZATION WITH CORONARY ANGIOGRAM N/A 08/31/2012   Procedure: LEFT HEART CATHETERIZATION WITH CORONARY ANGIOGRAM;  Surgeon: Harrell Gave  Santina Evans, MD;  Location: [facility] CATH LAB;  Service: Cardiovascular;  Laterality: N/A;  . LEFT HEART CATHETERIZATION WITH CORONARY ANGIOGRAM N/A 11/16/2013   Procedure: LEFT HEART CATHETERIZATION WITH CORONARY ANGIOGRAM;  Surgeon: Blane Ohara, MD;  Location: [facility] CATH LAB;  Service: Cardiovascular;  Laterality: N/A;  . LEFT HEART CATHETERIZATION WITH CORONARY ANGIOGRAM N/A 03/15/2014   Procedure: LEFT HEART CATHETERIZATION WITH CORONARY ANGIOGRAM;  Surgeon: Peter M Martinique, MD;  Location: [facility] CATH LAB;  Service: Cardiovascular;  Laterality: N/A;  . PERCUTANEOUS CORONARY STENT INTERVENTION (PCI-S) Right 08/31/2012   Procedure: PERCUTANEOUS CORONARY STENT INTERVENTION (PCI-S);  Surgeon: Burnell Blanks, MD;  Location: [facility] CATH LAB;  Service: Cardiovascular;  Laterality: Right;  . PERCUTANEOUS CORONARY STENT INTERVENTION (PCI-S)  11/16/2013   Procedure: PERCUTANEOUS CORONARY STENT INTERVENTION (PCI-S);  Surgeon: Blane Ohara, MD;  Location: [facility] CATH LAB;  Service: Cardiovascular;;    Social History   Socioeconomic History  . Marital status: Single    Spouse name: Not on file  . Number of children: 0  . Years of education: Not on file  . Highest education level: Not on file  Occupational History  . Occupation: retired  Scientific laboratory technician  . Financial resource strain: Not on file  . Food insecurity    Worry: Not on file    Inability: Not on file  .  Transportation needs    Medical: Not on file    Non-medical: Not on file  Tobacco Use  . Smoking status: Former Smoker    Packs/day: 0.50    Years: 30.00    Pack years: 15.00    Types: Cigarettes  . Smokeless tobacco: Never Used  . Tobacco comment: trying to cut down  Substance and Sexual Activity  . Alcohol use: No  . Drug use: No    Comment: remote marijuana use  . Sexual activity: Not Currently  Lifestyle  . Physical activity    Days per week: Not on file    Minutes per session: Not on file  . Stress: Not on file  Relationships  . Social Herbalist on phone: Not on file    Gets together: Not on file    Attends religious service: Not on file    Active member of club or organization: Not on file    Attends meetings of clubs or organizations: Not on file    Relationship status: Not on file  Other Topics Concern  . Not on file  Social History Narrative   Lives alone.  He has no children.   He retired early due to health problems.          Family History  Problem Relation Age of Onset  . Depression Mother   . Cancer Mother        ovarian  . Hypertension Mother        Died, 48  . Other Father        motor vehicle accident  . Coronary artery disease Unknown   . Heart attack Neg Hx   . Stroke Neg Hx     Review of Systems  Constitutional: Negative for chills and fever.  Skin: Positive for wound. Negative for color change.  Neurological: Positive for numbness.       Objective:   Vitals:   11/26/18 1510  BP: 124/80  Pulse: (!) 57  Resp: 18  Temp: 98.6 F (37 C)  SpO2: 94%   BP Readings from Last 3 Encounters:  11/26/18 124/80  05/22/18 (!) 160/110  05/05/18 (!) 146/88   Wt Readings from Last 3 Encounters:  11/26/18 (!) 326 lb (147.9 kg)  05/22/18 (!) 330 lb 9.6 oz (150 kg)  05/05/18 (!) 318 lb (144.2 kg)   Body mass index is 43.01 kg/m.   Physical Exam Constitutional:      General: He is not in acute distress.    Appearance:  Normal appearance. He is not ill-appearing.  Cardiovascular:     Pulses: Normal pulses.  Skin:    General: Skin is warm and dry.     Comments: V shaped cut plantar surface middle of left foot.  No surrounding redness or swelling. No bleeding or discharge.  Mild pain with palpation.  No fluctuance.  No induration.    Neurological:     Mental Status: He is alert.     Sensory: Sensory deficit (slighlty decreased sensation left foot) present.            Assessment & Plan:    See Problem List for Assessment and Plan of chronic medical problems.

## 2018-11-26 NOTE — Patient Instructions (Signed)
Your foot injury does not look infected, so we do not need to start an antibiotic now.  Keep a close eye on it and if you see any redness, swelling or discharge call immediately.

## 2018-11-26 NOTE — Assessment & Plan Note (Signed)
Occurred 3 days ago No evidence of infeciton For now just monitor - discussed what to monitor for Advised him to call with any questions or concerns or signs of infection Keep covered  Discussed checking his feet daily chronically given peripheral neuropathy

## 2018-12-01 ENCOUNTER — Other Ambulatory Visit: Payer: Self-pay

## 2018-12-01 MED ORDER — CLOPIDOGREL BISULFATE 75 MG PO TABS: ORAL_TABLET | ORAL | 1 refills | Status: DC

## 2018-12-03 ENCOUNTER — Telehealth: Payer: Self-pay | Admitting: Internal Medicine

## 2018-12-03 DIAGNOSIS — I2510 Atherosclerotic heart disease of native coronary artery without angina pectoris: Secondary | ICD-10-CM

## 2018-12-03 MED ORDER — ISOSORBIDE MONONITRATE ER 30 MG PO TB24: ORAL_TABLET | ORAL | 1 refills | Status: DC

## 2018-12-03 MED ORDER — CLOPIDOGREL BISULFATE 75 MG PO TABS: ORAL_TABLET | ORAL | 1 refills | Status: DC

## 2018-12-03 NOTE — Telephone Encounter (Signed)
Medication Refill - Medication: clopidogrel (PLAVIX) 75 MG tablet isosorbide mononitrate (IMDUR) 30 MG 24 hr tablet  Has the patient contacted their pharmacy? Yes - states that the pharmacy hasn't heard back from [place]  (Agent: If no, request that the patient contact the pharmacy for the refill.) (Agent: If yes, when and what did the pharmacy advise?)  Preferred Pharmacy (with phone number or street name):  [facility] [place], [place] - [address] [phone] (Phone) [phone] (Fax)     Agent: Please be advised that RX refills may take up to 3 business days. We ask that you follow-up with your pharmacy.

## 2018-12-08 ENCOUNTER — Other Ambulatory Visit: Payer: Self-pay

## 2018-12-08 MED ORDER — POTASSIUM CHLORIDE ER 10 MEQ PO TBCR: EXTENDED_RELEASE_TABLET | ORAL | 1 refills | Status: DC

## 2018-12-23 ENCOUNTER — Other Ambulatory Visit: Payer: Self-pay

## 2018-12-23 ENCOUNTER — Ambulatory Visit (INDEPENDENT_AMBULATORY_CARE_PROVIDER_SITE_OTHER): Payer: Medicare HMO | Admitting: Internal Medicine

## 2018-12-23 ENCOUNTER — Encounter: Payer: Self-pay | Admitting: Internal Medicine

## 2018-12-23 VITALS — BP 138/88 | HR 67 | Temp 98.6°F | Ht 73.0 in | Wt 326.0 lb

## 2018-12-23 DIAGNOSIS — E538 Deficiency of other specified B group vitamins: Secondary | ICD-10-CM | POA: Diagnosis not present

## 2018-12-23 DIAGNOSIS — F418 Other specified anxiety disorders: Secondary | ICD-10-CM

## 2018-12-23 DIAGNOSIS — E559 Vitamin D deficiency, unspecified: Secondary | ICD-10-CM

## 2018-12-23 DIAGNOSIS — E1159 Type 2 diabetes mellitus with other circulatory complications: Secondary | ICD-10-CM

## 2018-12-23 DIAGNOSIS — E611 Iron deficiency: Secondary | ICD-10-CM | POA: Diagnosis not present

## 2018-12-23 DIAGNOSIS — Z0000 Encounter for general adult medical examination without abnormal findings: Secondary | ICD-10-CM

## 2018-12-23 MED ORDER — BUPROPION HCL ER (XL) 300 MG PO TB24: 300.0000 mg | ORAL_TABLET | Freq: Every day | ORAL | 3 refills | Status: DC

## 2018-12-23 MED ORDER — PAROXETINE HCL 20 MG PO TABS: 20.0000 mg | ORAL_TABLET | Freq: Every day | ORAL | 3 refills | Status: DC

## 2018-12-23 NOTE — Assessment & Plan Note (Signed)
stable overall by history and exam, recent data reviewed with pt, and pt to continue medical treatment as before,  to f/u any worsening symptoms or concerns  

## 2018-12-23 NOTE — Assessment & Plan Note (Signed)

## 2018-12-23 NOTE — Assessment & Plan Note (Signed)
[place] for change to paxil 20 qd + wellbutrin asd,  to f/u any worsening symptoms or concerns

## 2018-12-23 NOTE — Progress Notes (Signed)
Subjective:    Patient ID: [name] [name], male    DOB: [birth year], 63 y.o.   MRN: [id number]  HPI  Here for wellness and f/u;  Overall doing ok;  Pt denies Chest pain, worsening SOB, DOE, wheezing, orthopnea, PND, worsening LE edema, palpitations, dizziness or syncope.  Pt denies neurological change such as new headache, facial or extremity weakness.  Pt denies polydipsia, polyuria, or low sugar symptoms. Pt states overall good compliance with treatment and medications, good tolerability, and has been trying to follow appropriate diet.  Pt denies worsening depressive symptoms, suicidal ideation or panic. No fever, night sweats, wt loss, loss of appetite, or other constitutional symptoms.  Pt states good ability with ADL's, has low fall risk, home safety reviewed and adequate, no other significant changes in hearing or vision, and only occasionally active with exercise.  Does c/o More anxiety recently with ongoing depression, maybe related to the pandemic worries.  Lost some wt with better diet.  Asking to change lexapro to paxil.   Wt Readings from Last 3 Encounters:  12/23/18 (!) 326 lb (147.9 kg)  11/26/18 (!) 326 lb (147.9 kg)  05/22/18 (!) 330 lb 9.6 oz (150 kg)   Past Medical History:  Diagnosis Date  . CAD (coronary artery disease)    a. s/p PCI/BMS pRCA 2002; b. PCI pRCA & DES p/m RCA 2006; c. PCI/DES OM4 2007; d. PCI/CBA to RCA for ISR 10/2006; e.08/2012 STEMI/Cath/PCI:  LAD 95% >> PCI: 3.0x20 Promus Prem DES  //  f. NSTEMI 10/15 >> LHC: mLAD stent ok, dLAD 70, OM1 CTO, OM2 stent ok, dOM2 90, OM3 30-40, dLCx 90, p-mRCA stent ok, mRCA stent ok w/ 60-70 ISR, EF 50% >> med Rx  . Chronic combined systolic and diastolic CHF (congestive heart failure) ([place])    a. Myoview 1/17: EF 35%  //  b. Echo 1/17 EF 45-50%  . Depression   . Diabetes mellitus ([place])    a. A1c 8.8 08/2012->Metformin initiated. => b. A1c (9/14): 6.6  . GERD (gastroesophageal reflux disease)   . History of nuclear stress test     a. Myoview 1/17: EF 35%, fixed inferior lateral defect consistent with infarct, no ischemia, intermediate risk  . History of pneumonia   . HTN (hypertension)   . Hyperlipidemia   . Ischemic cardiomyopathy    a. EF 40%; improved to normal;  b. 08/2012 Echo: EF 50-55%, mod LVH.//  c. Echo 9/16: Inf HK, mild LVH, EF 55%, mild LAE, normal RVF, mild RAE, PASP 35 mmHg  //  d. Echo 1/17: EF 45-50%, inferior HK, mild BAE, PASP 33 mmHg  . NSTEMI (non-ST elevated myocardial infarction) ([place])   . Obesity   . OSA (obstructive sleep apnea)    Does not use CPAP as of 05/2011  . Tobacco abuse    Past Surgical History:  Procedure Laterality Date  . CORONARY ANGIOPLASTY WITH STENT PLACEMENT    . CORONARY STENT PLACEMENT  2009  . LEFT HEART CATHETERIZATION WITH CORONARY ANGIOGRAM N/A 08/31/2012   Procedure: LEFT HEART CATHETERIZATION WITH CORONARY ANGIOGRAM;  Surgeon: Burnell Blanks, MD;  Location: [facility] CATH LAB;  Service: Cardiovascular;  Laterality: N/A;  . LEFT HEART CATHETERIZATION WITH CORONARY ANGIOGRAM N/A 11/16/2013   Procedure: LEFT HEART CATHETERIZATION WITH CORONARY ANGIOGRAM;  Surgeon: Blane Ohara, MD;  Location: [facility] CATH LAB;  Service: Cardiovascular;  Laterality: N/A;  . LEFT HEART CATHETERIZATION WITH CORONARY ANGIOGRAM N/A 03/15/2014   Procedure: LEFT HEART CATHETERIZATION WITH CORONARY ANGIOGRAM;  Surgeon: Peter M Martinique, MD;  Location: [facility] CATH LAB;  Service: Cardiovascular;  Laterality: N/A;  . PERCUTANEOUS CORONARY STENT INTERVENTION (PCI-S) Right 08/31/2012   Procedure: PERCUTANEOUS CORONARY STENT INTERVENTION (PCI-S);  Surgeon: Burnell Blanks, MD;  Location: [facility] CATH LAB;  Service: Cardiovascular;  Laterality: Right;  . PERCUTANEOUS CORONARY STENT INTERVENTION (PCI-S)  11/16/2013   Procedure: PERCUTANEOUS CORONARY STENT INTERVENTION (PCI-S);  Surgeon: Blane Ohara, MD;  Location: [facility] CATH LAB;  Service: Cardiovascular;;    reports that he has quit smoking. His smoking use  included cigarettes. He has a 15.00 pack-year smoking history. He has never used smokeless tobacco. He reports that he does not drink alcohol or use drugs. family history includes Cancer in his mother; Coronary artery disease in his unknown relative; Depression in his mother; Hypertension in his mother; Other in his father. Allergies  Allergen Reactions  . Penicillins Other (See Comments)    Unknown childhood reaction   Current Outpatient Medications on File Prior to Visit  Medication Sig Dispense Refill  . albuterol (PROVENTIL HFA;VENTOLIN HFA) 108 (90 Base) MCG/ACT inhaler Inhale 2 puffs into the lungs every 6 (six) hours as needed for wheezing or shortness of breath. 1 Inhaler 11  . aspirin 81 MG tablet Take 81 mg by mouth daily.    . carvedilol (COREG) 12.5 MG tablet Take 1 tablet (12.5 mg total) by mouth 2 (two) times daily with a meal. 180 tablet 3  . cetirizine (ZYRTEC) 10 MG tablet TAKE 1 TABLET(10 MG) BY MOUTH DAILY 30 tablet 2  . cloNIDine (CATAPRES) 0.3 MG tablet Take 1 tablet (0.3 mg total) by mouth 2 (two) times daily. 180 tablet 3  . clopidogrel (PLAVIX) 75 MG tablet TAKE 1 TABLET(75 MG) BY MOUTH DAILY 90 tablet 1  . diclofenac sodium (VOLTAREN) 1 % GEL Apply 2 g topically 4 (four) times daily. To affected joint. 100 g 11  . furosemide (LASIX) 40 MG tablet TAKE 2 TABLETS BY MOUTH EVERY MORNING AND 1 TABLET BY MOUTH EVERY EVENING. TAKE AN EXTRA TABLET AS NEEDED FOR WEIGHT GAIN OF 3LBS OR MORE 360 tablet 0  . gabapentin (NEURONTIN) 300 MG capsule Take 1 capsule (300 mg total) by mouth 3 (three) times daily. 270 capsule 1  . glipiZIDE (GLUCOTROL XL) 2.5 MG 24 hr tablet TAKE 1 TABLET(2.5 MG) BY MOUTH DAILY WITH BREAKFAST 90 tablet 0  . isosorbide mononitrate (IMDUR) 30 MG 24 hr tablet TAKE 3 TABLETS(90 MG) BY MOUTH DAILY 270 tablet 1  . lactulose (CHRONULAC) 10 GM/15ML solution TAKE 30 ML BY MOUTH TWICE DAILY AS NEEDED 6081 mL 1  . losartan (COZAAR) 100 MG tablet TAKE 1 TABLET(100 MG)  BY MOUTH DAILY 90 tablet 0  . Multiple Vitamins-Minerals (MULTIVITAMIN WITH MINERALS) tablet Take 1 tablet by mouth daily. CENTRUM SILVER MENS  50 PLUS    . nitroGLYCERIN (NITROSTAT) 0.4 MG SL tablet PLACE 1 TABLET UNDER THE TONGUE EVERY 5 MINUTES AS NEEDED FOR CHEST PAIN 25 tablet 5  . pantoprazole (PROTONIX) 40 MG tablet TAKE 1 TABLET(40 MG) BY MOUTH DAILY 90 tablet 0  . potassium chloride (K-DUR) 10 MEQ tablet TAKE 2 TABLETS BY MOUTH EVERY MORNING AND 1 TABLET BY MOUTH EVERY EVENING 90 tablet 0  . rosuvastatin (CRESTOR) 40 MG tablet Take 1 tablet (40 mg total) by mouth daily. 90 tablet 3  . traMADol (ULTRAM) 50 MG tablet TAKE 1 TABLET(50 MG) BY MOUTH EVERY 6 HOURS AS NEEDED 120 tablet 5  . triamcinolone (NASACORT) 55 MCG/ACT AERO  nasal inhaler Place 2 sprays into the nose daily. 1 Inhaler 12   No current facility-administered medications on file prior to visit.    Current Outpatient Medications on File Prior to Visit  Medication Sig Dispense Refill  . albuterol (PROVENTIL HFA;VENTOLIN HFA) 108 (90 Base) MCG/ACT inhaler Inhale 2 puffs into the lungs every 6 (six) hours as needed for wheezing or shortness of breath. 1 Inhaler 11  . aspirin 81 MG tablet Take 81 mg by mouth daily.    . carvedilol (COREG) 12.5 MG tablet Take 1 tablet (12.5 mg total) by mouth 2 (two) times daily with a meal. 180 tablet 3  . cetirizine (ZYRTEC) 10 MG tablet TAKE 1 TABLET(10 MG) BY MOUTH DAILY 30 tablet 2  . cloNIDine (CATAPRES) 0.3 MG tablet Take 1 tablet (0.3 mg total) by mouth 2 (two) times daily. 180 tablet 3  . clopidogrel (PLAVIX) 75 MG tablet TAKE 1 TABLET(75 MG) BY MOUTH DAILY 90 tablet 1  . diclofenac sodium (VOLTAREN) 1 % GEL Apply 2 g topically 4 (four) times daily. To affected joint. 100 g 11  . furosemide (LASIX) 40 MG tablet TAKE 2 TABLETS BY MOUTH EVERY MORNING AND 1 TABLET BY MOUTH EVERY EVENING. TAKE AN EXTRA TABLET AS NEEDED FOR WEIGHT GAIN OF 3LBS OR MORE 360 tablet 0  . gabapentin (NEURONTIN) 300  MG capsule Take 1 capsule (300 mg total) by mouth 3 (three) times daily. 270 capsule 1  . glipiZIDE (GLUCOTROL XL) 2.5 MG 24 hr tablet TAKE 1 TABLET(2.5 MG) BY MOUTH DAILY WITH BREAKFAST 90 tablet 0  . isosorbide mononitrate (IMDUR) 30 MG 24 hr tablet TAKE 3 TABLETS(90 MG) BY MOUTH DAILY 270 tablet 1  . lactulose (CHRONULAC) 10 GM/15ML solution TAKE 30 ML BY MOUTH TWICE DAILY AS NEEDED 6081 mL 1  . losartan (COZAAR) 100 MG tablet TAKE 1 TABLET(100 MG) BY MOUTH DAILY 90 tablet 0  . Multiple Vitamins-Minerals (MULTIVITAMIN WITH MINERALS) tablet Take 1 tablet by mouth daily. CENTRUM SILVER MENS  50 PLUS    . nitroGLYCERIN (NITROSTAT) 0.4 MG SL tablet PLACE 1 TABLET UNDER THE TONGUE EVERY 5 MINUTES AS NEEDED FOR CHEST PAIN 25 tablet 5  . pantoprazole (PROTONIX) 40 MG tablet TAKE 1 TABLET(40 MG) BY MOUTH DAILY 90 tablet 0  . potassium chloride (K-DUR) 10 MEQ tablet TAKE 2 TABLETS BY MOUTH EVERY MORNING AND 1 TABLET BY MOUTH EVERY EVENING 90 tablet 0  . rosuvastatin (CRESTOR) 40 MG tablet Take 1 tablet (40 mg total) by mouth daily. 90 tablet 3  . traMADol (ULTRAM) 50 MG tablet TAKE 1 TABLET(50 MG) BY MOUTH EVERY 6 HOURS AS NEEDED 120 tablet 5  . triamcinolone (NASACORT) 55 MCG/ACT AERO nasal inhaler Place 2 sprays into the nose daily. 1 Inhaler 12   No current facility-administered medications on file prior to visit.    Review of Systems Constitutional: Negative for other unusual diaphoresis, sweats, appetite or weight changes HENT: Negative for other worsening hearing loss, ear pain, facial swelling, mouth sores or neck stiffness.   Eyes: Negative for other worsening pain, redness or other visual disturbance.  Respiratory: Negative for other stridor or swelling Cardiovascular: Negative for other palpitations or other chest pain  Gastrointestinal: Negative for worsening diarrhea or loose stools, blood in stool, distention or other pain Genitourinary: Negative for hematuria, flank pain or other change  in urine volume.  Musculoskeletal: Negative for myalgias or other joint swelling.  Skin: Negative for other color change, or other wound or worsening drainage.  Neurological: Negative for other syncope or numbness. Hematological: Negative for other adenopathy or swelling Psychiatric/Behavioral: Negative for hallucinations, other worsening agitation, SI, self-injury, or new decreased concentration All other system neg per pt    Objective:   Physical Exam BP 138/88   Pulse 67   Temp 98.6 F (37 C) (Oral)   Ht 6\' 1"  (1.854 m)   Wt (!) 326 lb (147.9 kg)   SpO2 95%   BMI 43.01 kg/m  VS noted,  Constitutional: Pt is oriented to person, place, and time. Appears well-developed and well-nourished, in no significant distress and comfortable Head: Normocephalic and atraumatic  Eyes: Conjunctivae and EOM are normal. Pupils are equal, round, and reactive to light Right Ear: External ear normal without discharge Left Ear: External ear normal without discharge Nose: Nose without discharge or deformity Mouth/Throat: Oropharynx is without other ulcerations and moist  Neck: Normal range of motion. Neck supple. No JVD present. No tracheal deviation present or significant neck LA or mass Cardiovascular: Normal rate, regular rhythm, normal heart sounds and intact distal pulses.   Pulmonary/Chest: WOB normal and breath sounds without rales or wheezing  Abdominal: Soft. Bowel sounds are normal. NT. No HSM  Musculoskeletal: Normal range of motion. Exhibits no edema Lymphadenopathy: Has no other cervical adenopathy.  Neurological: Pt is alert and oriented to person, place, and time. Pt has normal reflexes. No cranial nerve deficit. Motor grossly intact, Gait intact Skin: Skin is warm and dry. No rash noted or new ulcerations Psychiatric:  Has normal mood and affect. Behavior is normal without agitation No other exam findings Lab Results  Component Value Date   WBC 7.2 05/30/2017   HGB 14.9 05/30/2017    HCT 45.2 05/30/2017   PLT 192 05/30/2017   GLUCOSE 120 (H) 05/12/2018   CHOL 127 05/12/2018   TRIG 56.0 05/12/2018   HDL 42.50 05/12/2018   LDLCALC 73 05/12/2018   ALT 17 05/12/2018   AST 16 05/12/2018   NA 144 05/12/2018   K 3.9 05/12/2018   CL 107 05/12/2018   CREATININE 1.65 (H) 05/12/2018   BUN 21 05/12/2018   CO2 27 05/12/2018   TSH 1.67 05/14/2017   PSA 0.83 05/14/2017   INR 1.00 05/29/2017   HGBA1C 6.9 (H) 05/12/2018   MICROALBUR 49.0 (H) 05/14/2017       Assessment & Plan:

## 2018-12-23 NOTE — Patient Instructions (Signed)
Ok to change the lexapro to the combination of paxil and wellbutrin  Please continue all other medications as before, and refills have been done if requested.  Please have the pharmacy call with any other refills you may need.  Please continue your efforts at being more active, low cholesterol diet, and weight control.  You are otherwise up to date with prevention measures today.  Please keep your appointments with your specialists as you may have planned  Please go to the LAB in the Basement (turn left off the elevator) for the tests to be done   You will be contacted by phone if any changes need to be made immediately.  Otherwise, you will receive a letter about your results with an explanation, but please check with MyChart first.  Please remember to sign up for MyChart if you have not done so, as this will be important to you in the future with finding out test results, communicating by private email, and scheduling acute appointments online when needed.  Please return in 6 months, or sooner if needed, with Lab testing done 3-5 days before

## 2018-12-24 ENCOUNTER — Other Ambulatory Visit (INDEPENDENT_AMBULATORY_CARE_PROVIDER_SITE_OTHER): Payer: Medicare HMO

## 2018-12-24 ENCOUNTER — Telehealth: Payer: Self-pay

## 2018-12-24 ENCOUNTER — Other Ambulatory Visit: Payer: Self-pay | Admitting: Internal Medicine

## 2018-12-24 DIAGNOSIS — E611 Iron deficiency: Secondary | ICD-10-CM

## 2018-12-24 DIAGNOSIS — E559 Vitamin D deficiency, unspecified: Secondary | ICD-10-CM

## 2018-12-24 DIAGNOSIS — Z0000 Encounter for general adult medical examination without abnormal findings: Secondary | ICD-10-CM

## 2018-12-24 DIAGNOSIS — E1159 Type 2 diabetes mellitus with other circulatory complications: Secondary | ICD-10-CM | POA: Diagnosis not present

## 2018-12-24 DIAGNOSIS — E538 Deficiency of other specified B group vitamins: Secondary | ICD-10-CM | POA: Diagnosis not present

## 2018-12-24 DIAGNOSIS — N183 Chronic kidney disease, stage 3 unspecified: Secondary | ICD-10-CM

## 2018-12-24 LAB — CBC WITH DIFFERENTIAL/PLATELET
Basophils Absolute: 0 10*3/uL (ref 0.0–0.1)
Basophils Relative: 0.7 % (ref 0.0–3.0)
Eosinophils Absolute: 0.2 10*3/uL (ref 0.0–0.7)
Eosinophils Relative: 3.9 % (ref 0.0–5.0)
HCT: 45.7 % (ref 39.0–52.0)
Hemoglobin: 14.8 g/dL (ref 13.0–17.0)
Lymphocytes Relative: 56.3 % — ABNORMAL HIGH (ref 12.0–46.0)
Lymphs Abs: 3.2 10*3/uL (ref 0.7–4.0)
MCHC: 32.3 g/dL (ref 30.0–36.0)
MCV: 88.6 fl (ref 78.0–100.0)
Monocytes Absolute: 0.7 10*3/uL (ref 0.1–1.0)
Monocytes Relative: 11.5 % (ref 3.0–12.0)
Neutro Abs: 1.6 10*3/uL (ref 1.4–7.7)
Neutrophils Relative %: 27.6 % — ABNORMAL LOW (ref 43.0–77.0)
Platelets: 195 10*3/uL (ref 150.0–400.0)
RBC: 5.16 Mil/uL (ref 4.22–5.81)
RDW: 16.2 % — ABNORMAL HIGH (ref 11.5–15.5)
WBC: 5.7 10*3/uL (ref 4.0–10.5)

## 2018-12-24 LAB — IBC PANEL
Iron: 79 ug/dL (ref 42–165)
Saturation Ratios: 27 % (ref 20.0–50.0)
Transferrin: 209 mg/dL — ABNORMAL LOW (ref 212.0–360.0)

## 2018-12-24 LAB — HEPATIC FUNCTION PANEL
ALT: 15 U/L (ref 0–53)
AST: 18 U/L (ref 0–37)
Albumin: 4.1 g/dL (ref 3.5–5.2)
Alkaline Phosphatase: 81 U/L (ref 39–117)
Bilirubin, Direct: 0.1 mg/dL (ref 0.0–0.3)
Total Bilirubin: 0.5 mg/dL (ref 0.2–1.2)
Total Protein: 6.8 g/dL (ref 6.0–8.3)

## 2018-12-24 LAB — BASIC METABOLIC PANEL
BUN: 15 mg/dL (ref 6–23)
CO2: 29 mEq/L (ref 19–32)
Calcium: 9.1 mg/dL (ref 8.4–10.5)
Chloride: 106 mEq/L (ref 96–112)
Creatinine, Ser: 1.75 mg/dL — ABNORMAL HIGH (ref 0.40–1.50)
GFR: 48.05 mL/min — ABNORMAL LOW (ref >60.00)
Glucose, Bld: 105 mg/dL — ABNORMAL HIGH (ref 70–99)
Potassium: 3.9 mEq/L (ref 3.5–5.1)
Sodium: 143 mEq/L (ref 135–145)

## 2018-12-24 LAB — LIPID PANEL
Cholesterol: 114 mg/dL (ref 0–200)
HDL: 49.7 mg/dL (ref >39.00)
LDL Cholesterol: 54 mg/dL (ref 0–99)
NonHDL: 64.65
Total CHOL/HDL Ratio: 2
Triglycerides: 55 mg/dL (ref 0.0–149.0)
VLDL: 11 mg/dL (ref 0.0–40.0)

## 2018-12-24 LAB — HEMOGLOBIN A1C: Hgb A1c MFr Bld: 7.1 % — ABNORMAL HIGH (ref 4.6–6.5)

## 2018-12-24 LAB — PSA: PSA: 1.28 ng/mL (ref 0.10–4.00)

## 2018-12-24 LAB — TSH: TSH: 1.47 u[IU]/mL (ref 0.35–4.50)

## 2018-12-24 LAB — VITAMIN D 25 HYDROXY (VIT D DEFICIENCY, FRACTURES): VITD: 7.64 ng/mL — ABNORMAL LOW (ref 30.00–100.00)

## 2018-12-24 LAB — VITAMIN B12: Vitamin B-12: 205 pg/mL — ABNORMAL LOW (ref 211–911)

## 2018-12-24 MED ORDER — VITAMIN D (ERGOCALCIFEROL) 1.25 MG (50000 UNIT) PO CAPS: 50000.0000 [IU] | ORAL_CAPSULE | ORAL | 0 refills | Status: DC

## 2018-12-24 NOTE — Telephone Encounter (Signed)
Called pt, LVM.   CRM created.  

## 2018-12-24 NOTE — Telephone Encounter (Signed)
-----   Message from Biagio Borg, MD sent at 12/24/2018 12:43 PM EDT ----- Left message on MyChart, pt to cont same tx except  The test results show that your current treatment is OK, except the Vitamin D level is low, the Vitamin B12 level is low, and the Kidney function remains moderately low.  Also the A1c is slightly higher, but acceptable for now.    We need to: 1)  Please take Vitamin D 50000 units weekly for 12 weeks, then plan to change to OTC Vitamin D3 at 2000 units per day, indefinitely. 2)  Start monthly B12 shots until next visit 3)  Refer to Nephrology for moderately slow kidneys to help keep them from getting worse

## 2019-01-31 ENCOUNTER — Other Ambulatory Visit: Payer: Self-pay

## 2019-01-31 ENCOUNTER — Encounter (HOSPITAL_COMMUNITY): Payer: Self-pay

## 2019-01-31 ENCOUNTER — Ambulatory Visit (HOSPITAL_COMMUNITY)
Admission: EM | Admit: 2019-01-31 | Discharge: 2019-01-31 | Disposition: A | Discharge status: Home / Self Care | Payer: Medicare HMO

## 2019-01-31 DIAGNOSIS — L84 Corns and callosities: Secondary | ICD-10-CM

## 2019-01-31 NOTE — Discharge Instructions (Signed)
Soak foot 2-3 times daily, dry foot well after May try moisturizing with socks at bedtime Avoid trauma to edge of callus Follow up with podiatry Follow up here if developing sings of infection- increased pain, redness, swelling or drainage

## 2019-01-31 NOTE — ED Provider Notes (Signed)
[facility]    CSN: [id number] Arrival date & time: 01/31/19  1108      History   Chief Complaint Chief Complaint  Patient presents with  . Wound Check    HPI [name] [name] is a 63 y.o. male history of CAD, DM type II, GERD, hypertension, hyperlipidemia, tobacco use, presenting today for evaluation of a callus.  Patient states that he has a callus to the bottom of his right foot.  He has had this for a while and often will follow-up with podiatry to have this "shaved".  He states that he was recently picking at it and saw the edges pulling up off the sides.  This prompted his evaluation today.  He has not had any pain associated with this.  Denies any drainage or change in color.  He has not followed up with podiatry in a while.  Notes that he has neuropathy that is more prominent in his left, mild and right.  HPI  Past Medical History:  Diagnosis Date  . CAD (coronary artery disease)    a. s/p PCI/BMS pRCA 2002; b. PCI pRCA & DES p/m RCA 2006; c. PCI/DES OM4 2007; d. PCI/CBA to RCA for ISR 10/2006; e.08/2012 STEMI/Cath/PCI:  LAD 95% >> PCI: 3.0x20 Promus Prem DES  //  f. NSTEMI 10/15 >> LHC: mLAD stent ok, dLAD 70, OM1 CTO, OM2 stent ok, dOM2 90, OM3 30-40, dLCx 90, p-mRCA stent ok, mRCA stent ok w/ 60-70 ISR, EF 50% >> med Rx  . Chronic combined systolic and diastolic CHF (congestive heart failure) ([place])    a. Myoview 1/17: EF 35%  //  b. Echo 1/17 EF 45-50%  . Depression   . Diabetes mellitus ([place])    a. A1c 8.8 08/2012->Metformin initiated. => b. A1c (9/14): 6.6  . GERD (gastroesophageal reflux disease)   . History of nuclear stress test    a. Myoview 1/17: EF 35%, fixed inferior lateral defect consistent with infarct, no ischemia, intermediate risk  . History of pneumonia   . HTN (hypertension)   . Hyperlipidemia   . Ischemic cardiomyopathy    a. EF 40%; improved to normal;  b. 08/2012 Echo: EF 50-55%, mod LVH.//  c. Echo 9/16: Inf HK, mild LVH, EF 55%, mild LAE,  normal RVF, mild RAE, PASP 35 mmHg  //  d. Echo 1/17: EF 45-50%, inferior HK, mild BAE, PASP 33 mmHg  . NSTEMI (non-ST elevated myocardial infarction) ([place])   . Obesity   . OSA (obstructive sleep apnea)    Does not use CPAP as of 05/2011  . Tobacco abuse     Patient Active Problem List   Diagnosis Date Noted  . Foot injury, left, initial encounter 11/26/2018  . Chest pain 05/30/2017  . Hypokalemia 05/30/2017  . Allergic rhinitis 12/18/2016  . Lumbar radiculitis 09/05/2016  . Lumbar spinal stenosis 09/05/2016  . CKD (chronic kidney disease), stage III ([place]) 12/29/2015  . Chest pain syndrome 12/15/2015  . Acute kidney injury ([place]) 12/15/2015  . Atypical chest pain 12/15/2015  . Preventative health care 06/27/2015  . Chronic low back pain 06/27/2015  . Left lumbar radiculopathy 04/13/2015  . Bilateral plantar fasciitis 04/13/2015  . Essential hypertension 03/09/2015  . OSA (obstructive sleep apnea) 03/27/2014  . Peripheral neuropathy ([place]) 12/29/2013  . External hemorrhoid, thrombosed 12/03/2013  . Sinus bradycardia 11/15/2013  . NSTEMI (non-ST elevated myocardial infarction) ([place]) 11/14/2013  . Chronic combined systolic and diastolic CHF  [id number]  . CAD S/P multiple PCI   .  Hyperlipidemia   . DM2 (diabetes mellitus, type 2) ([place]) 06/26/2013  . Gait disorder 06/26/2013  . Obesity, Class III, BMI 40-49.9 (morbid obesity) ([place]) 09/10/2012  . Tobacco abuse 09/02/2012  . Ischemic cardiomyopathy- EF 50-55% June 2015   . ST elevation myocardial infarction (STEMI) of anterior wall, initial episode of care ([place]) 09/01/2012  . HEMORRHOIDS 12/26/2009  . Acute prostatitis 12/26/2009  . GANGLION CYST, WRIST, RIGHT 11/28/2009  . COLONIC POLYPS 10/07/2009  . Anxiety with depression 07/21/2009  . DYSPNEA 03/24/2009  . GASTROESOPHAGEAL REFLUX DISEASE 01/27/2007  . PERCUTANEOUS TRANSLUMINAL CORONARY ANGIOPLASTY, HX OF 02/09/2006    Past Surgical History:  Procedure Laterality Date   . CORONARY ANGIOPLASTY WITH STENT PLACEMENT    . CORONARY STENT PLACEMENT  2009  . LEFT HEART CATHETERIZATION WITH CORONARY ANGIOGRAM N/A 08/31/2012   Procedure: LEFT HEART CATHETERIZATION WITH CORONARY ANGIOGRAM;  Surgeon: Burnell Blanks, MD;  Location: [facility] CATH LAB;  Service: Cardiovascular;  Laterality: N/A;  . LEFT HEART CATHETERIZATION WITH CORONARY ANGIOGRAM N/A 11/16/2013   Procedure: LEFT HEART CATHETERIZATION WITH CORONARY ANGIOGRAM;  Surgeon: Blane Ohara, MD;  Location: [facility] CATH LAB;  Service: Cardiovascular;  Laterality: N/A;  . LEFT HEART CATHETERIZATION WITH CORONARY ANGIOGRAM N/A 03/15/2014   Procedure: LEFT HEART CATHETERIZATION WITH CORONARY ANGIOGRAM;  Surgeon: Peter M Martinique, MD;  Location: [facility] CATH LAB;  Service: Cardiovascular;  Laterality: N/A;  . PERCUTANEOUS CORONARY STENT INTERVENTION (PCI-S) Right 08/31/2012   Procedure: PERCUTANEOUS CORONARY STENT INTERVENTION (PCI-S);  Surgeon: Burnell Blanks, MD;  Location[facility] CATH LAB;  Service: Cardiovascular;  Laterality: Right;  . PERCUTANEOUS CORONARY STENT INTERVENTION (PCI-S)  11/16/2013   Procedure: PERCUTANEOUS CORONARY STENT INTERVENTION (PCI-S);  Surgeon: Blane Ohara, MD;  Location: [facility] CATH LAB;  Service: Cardiovascular;;       Home Medications    Prior to Admission medications   Medication Sig Start Date End Date Taking? Authorizing Provider  albuterol (PROVENTIL HFA;VENTOLIN HFA) 108 (90 Base) MCG/ACT inhaler Inhale 2 puffs into the lungs every 6 (six) hours as needed for wheezing or shortness of breath. 07/26/17   Biagio Borg, MD  aspirin 81 MG tablet Take 81 mg by mouth daily.    [provider]  buPROPion (WELLBUTRIN XL) 300 MG 24 hr tablet Take 1 tablet (300 mg total) by mouth daily. 12/23/18   Biagio Borg, MD  carvedilol (COREG) 12.5 MG tablet Take 1 tablet (12.5 mg total) by mouth 2 (two) times daily with a meal. 05/05/18   Biagio Borg, MD  cetirizine (ZYRTEC) 10 MG tablet TAKE 1  TABLET(10 MG) BY MOUTH DAILY 10/30/18   Biagio Borg, MD  cloNIDine (CATAPRES) 0.3 MG tablet Take 1 tablet (0.3 mg total) by mouth 2 (two) times daily. 05/05/18   Biagio Borg, MD  clopidogrel (PLAVIX) 75 MG tablet TAKE 1 TABLET(75 MG) BY MOUTH DAILY 12/03/18   Biagio Borg, MD  diclofenac sodium (VOLTAREN) 1 % GEL Apply 2 g topically 4 (four) times daily. To affected joint. 05/22/18   Lyndal Pulley, DO  furosemide (LASIX) 40 MG tablet TAKE 2 TABLETS BY MOUTH EVERY MORNING AND 1 TABLET BY MOUTH EVERY EVENING. TAKE AN EXTRA TABLET AS NEEDED FOR WEIGHT GAIN OF 3LBS OR MORE 10/27/18   Biagio Borg, MD  gabapentin (NEURONTIN) 300 MG capsule Take 1 capsule (300 mg total) by mouth 3 (three) times daily. 05/05/18   Biagio Borg, MD  glipiZIDE (GLUCOTROL XL) 2.5 MG 24 hr tablet TAKE  1 TABLET(2.5 MG) BY MOUTH DAILY WITH BREAKFAST 07/17/18   Biagio Borg, MD  isosorbide mononitrate (IMDUR) 30 MG 24 hr tablet TAKE 3 TABLETS(90 MG) BY MOUTH DAILY 12/03/18   Biagio Borg, MD  lactulose [facility]) 10 GM/15ML solution TAKE 30 ML BY MOUTH TWICE DAILY AS NEEDED 12/29/15   Biagio Borg, MD  losartan (COZAAR) 100 MG tablet TAKE 1 TABLET(100 MG) BY MOUTH DAILY 05/05/18   Biagio Borg, MD  Multiple Vitamins-Minerals (MULTIVITAMIN WITH MINERALS) tablet Take 1 tablet by mouth daily. CENTRUM SILVER MENS  50 PLUS    [provider]  nitroGLYCERIN (NITROSTAT) 0.4 MG SL tablet PLACE 1 TABLET UNDER THE TONGUE EVERY 5 MINUTES AS NEEDED FOR CHEST PAIN 05/09/17   Biagio Borg, MD  pantoprazole (PROTONIX) 40 MG tablet TAKE 1 TABLET(40 MG) BY MOUTH DAILY 08/01/18   Biagio Borg, MD  PARoxetine (PAXIL) 20 MG tablet Take 1 tablet (20 mg total) by mouth daily. 12/23/18   Biagio Borg, MD  potassium chloride (K-DUR) 10 MEQ tablet TAKE 2 TABLETS BY MOUTH EVERY MORNING AND 1 TABLET BY MOUTH EVERY EVENING 10/30/18   Biagio Borg, MD  rosuvastatin (CRESTOR) 40 MG tablet Take 1 tablet (40 mg total) by mouth daily. 05/09/17    Biagio Borg, MD  traMADol (ULTRAM) 50 MG tablet TAKE 1 TABLET(50 MG) BY MOUTH EVERY 6 HOURS AS NEEDED 11/04/18   Biagio Borg, MD  triamcinolone (NASACORT) 55 MCG/ACT AERO nasal inhaler Place 2 sprays into the nose daily. 05/09/17   Biagio Borg, MD  Vitamin D, Ergocalciferol, (DRISDOL) 1.25 MG (50000 UT) CAPS capsule Take 1 capsule (50,000 Units total) by mouth every 7 (seven) days. 12/24/18   Biagio Borg, MD    Family History Family History  Problem Relation Age of Onset  . Depression Mother   . Cancer Mother        ovarian  . Hypertension Mother        Died, 68  . Other Father        motor vehicle accident  . Coronary artery disease Other   . Heart attack Neg Hx   . Stroke Neg Hx     Social History Social History   Tobacco Use  . Smoking status: Former Smoker    Packs/day: 0.50    Years: 30.00    Pack years: 15.00    Types: Cigarettes  . Smokeless tobacco: Never Used  . Tobacco comment: trying to cut down  Substance Use Topics  . Alcohol use: No  . Drug use: No    Comment: remote marijuana use     Allergies   Penicillins   Review of Systems Review of Systems  Constitutional: Negative for fatigue and fever.  Eyes: Negative for redness, itching and visual disturbance.  Respiratory: Negative for shortness of breath.   Cardiovascular: Negative for chest pain and leg swelling.  Gastrointestinal: Negative for nausea and vomiting.  Musculoskeletal: Negative for arthralgias and myalgias.  Skin: Positive for wound. Negative for color change and rash.  Neurological: Negative for dizziness, syncope, weakness, light-headedness and headaches.     Physical Exam Triage Vital Signs ED Triage Vitals  Enc Vitals Group     BP 01/31/19 1118 (!) 173/112     Pulse Rate 01/31/19 1118 88     Resp 01/31/19 1118 17     Temp 01/31/19 1118 98.9 F (37.2 C)     Temp Source 01/31/19 1118 Oral  SpO2 01/31/19 1118 96 %     Weight --      Height --      Head  Circumference --      Peak Flow --      Pain Score 01/31/19 1126 0     Pain Loc --      Pain Edu? --      Excl. in [place]? --    No data found.  Updated Vital Signs BP (!) 173/112 (BP Location: Left Arm) Comment: pt states he forgot to take meds this morning  Pulse 88   Temp 98.9 F (37.2 C) (Oral)   Resp 17   SpO2 96%   Visual Acuity Right Eye Distance:   Left Eye Distance:   Bilateral Distance:    Right Eye Near:   Left Eye Near:    Bilateral Near:     Physical Exam Vitals signs and nursing note reviewed.  Constitutional:      Appearance: He is well-developed.     Comments: No acute distress  HENT:     Head: Normocephalic and atraumatic.     Nose: Nose normal.  Eyes:     Conjunctiva/sclera: Conjunctivae normal.  Neck:     Musculoskeletal: Neck supple.  Cardiovascular:     Rate and Rhythm: Normal rate.  Pulmonary:     Effort: Pulmonary effort is normal. No respiratory distress.  Abdominal:     General: There is no distension.  Musculoskeletal: Normal range of motion.  Skin:    General: Skin is warm and dry.     Comments: Plantar surface of right foot with callus area over central ball of foot.  Edge is slightly able to be lifted, no discomfort with moving this.  No underlying raw exposed skin, no bleeding, no surrounding erythema or drainage  Neurological:     Mental Status: He is alert and oriented to person, place, and time.      UC Treatments / Results  Labs (all labs ordered are listed, but only abnormal results are displayed) Labs Reviewed - No data to display  EKG   Radiology No results found.  Procedures Procedures (including critical care time)  Medications Ordered in UC Medications - No data to display  Initial Impression / Assessment and Plan / UC Course  I have reviewed the triage vital signs and the nursing notes.  Pertinent labs & imaging results that were available during my care of the patient were reviewed by me and considered in  my medical decision making (see chart for details).     Patient appears to have callus that is slightly detached.  Does not appear infected.  No opened areas of skin at this time.  Advised patient to keep protected to avoid further injury/pulling at this area.  Discussed soaks and moisturizing measures.  Follow-up with podiatry.  Feel lesion does not warrant any medicines at this time.  Recommended close monitoring.  Discussed strict return precautions. Patient verbalized understanding and is agreeable with plan.  Final Clinical Impressions(s) / UC Diagnoses   Final diagnoses:  Foot callus     Discharge Instructions     Soak foot 2-3 times daily, dry foot well after May try moisturizing with socks at bedtime Avoid trauma to edge of callus Follow up with podiatry Follow up here if developing sings of infection- increased pain, redness, swelling or drainage    ED Prescriptions    None     Controlled Substance Prescriptions [place] Controlled Substance Registry consulted? Not  Applicable   Janith Lima, PA-C 01/31/19 1447

## 2019-01-31 NOTE — ED Triage Notes (Signed)
Pt present a wound on his right foot, pt states he picked a callus that is located on the bottom of his foot.

## 2019-02-03 ENCOUNTER — Ambulatory Visit (INDEPENDENT_AMBULATORY_CARE_PROVIDER_SITE_OTHER): Payer: Medicare HMO | Admitting: Podiatry

## 2019-02-03 ENCOUNTER — Other Ambulatory Visit: Payer: Self-pay

## 2019-02-03 ENCOUNTER — Encounter: Payer: Self-pay | Admitting: Podiatry

## 2019-02-03 DIAGNOSIS — M79673 Pain in unspecified foot: Secondary | ICD-10-CM

## 2019-02-03 DIAGNOSIS — L84 Corns and callosities: Secondary | ICD-10-CM | POA: Insufficient documentation

## 2019-02-03 DIAGNOSIS — Q828 Other specified congenital malformations of skin: Secondary | ICD-10-CM | POA: Diagnosis not present

## 2019-02-03 NOTE — Progress Notes (Signed)
This patient presents to the office for callus on his right forefoot.  He says he picked at this callus and started bleeding.  He says he talked to his doctor who sent him to urgent care.  The urgent care evaluated this patient  On 8/22 and provided no  treatment but told him to be seen at this office.  He presents to the office saying his callus was bleeding right forefoot.  Patient was seen by Dr.  Adah Perl in December 2019.    He returns to the office over 8 months later.  Patient has been diagnosed with diabetes with neuropathy.  Patient is also taking paxil.    He presents to the office evaluation of his painful callus.  General Appearance  Alert, conversant and in no acute stress.  Vascular  Dorsalis pedis and posterior tibial  pulses are palpable  bilaterally.  Capillary return is within normal limits  bilaterally. Temperature is within normal limits  bilaterally.  Neurologic  Senn-Weinstein monofilament wire test diminished.bilaterally. Muscle power within normal limits bilaterally.  Nails Thick disfigured discolored nails with subungual debris  from hallux to fifth toes bilaterally. No evidence of bacterial infection or drainage bilaterally.  Orthopedic  No limitations of motion  feet .  No crepitus or effusions noted.  No bony pathology or digital deformities noted.  Plantar flexed second metatarsal with mallet/hammer toe second  B/L.  Skin  normotropic skin with no porokeratosis noted bilaterally.  No signs of infections or ulcers noted.  Sub 2 callus left forefoot.  Pre-ulcerous callus right forefoot. No infection noted.  Callus left foot.  Pre ulcerous callus right foot.  Diabetes with neuropathy  IE.  Debride callus sub 2  B/l.  RTC 10 days for reevaluation.  Padding applied to right foot.  Patient to have nails addressed next visit.  Gardiner Barefoot DPM

## 2019-02-25 ENCOUNTER — Other Ambulatory Visit: Payer: Self-pay

## 2019-02-25 ENCOUNTER — Encounter: Payer: Self-pay | Admitting: Podiatry

## 2019-02-25 ENCOUNTER — Ambulatory Visit (INDEPENDENT_AMBULATORY_CARE_PROVIDER_SITE_OTHER): Payer: Medicare HMO | Admitting: Podiatry

## 2019-02-25 DIAGNOSIS — L97519 Non-pressure chronic ulcer of other part of right foot with unspecified severity: Secondary | ICD-10-CM | POA: Diagnosis not present

## 2019-02-25 DIAGNOSIS — L84 Corns and callosities: Secondary | ICD-10-CM

## 2019-02-25 DIAGNOSIS — M79674 Pain in right toe(s): Secondary | ICD-10-CM

## 2019-02-25 DIAGNOSIS — E1142 Type 2 diabetes mellitus with diabetic polyneuropathy: Secondary | ICD-10-CM | POA: Diagnosis not present

## 2019-02-25 DIAGNOSIS — B351 Tinea unguium: Secondary | ICD-10-CM

## 2019-02-25 DIAGNOSIS — M79675 Pain in left toe(s): Secondary | ICD-10-CM

## 2019-02-25 NOTE — Progress Notes (Signed)
This patient presents to the office for callus on his right forefoot.   He was diagnosed with a pre-ulcerous callus with drainage last visit 10 days ago.  He presents the office stating that the callus has not been painful since his last visit and there is been no drainage.  Patient is diabetic with neuropathy and is taking Paxil.  He presents the office today for an evaluation of the pre-ulcerous callus.  He also says that he has thick disfigured discolored nails on both feet.  He states his nails are painful walking wearing his shoes.  Patient is unable to self treat.  He presents the office today for an evaluation and treatment of these thick painful nails.  General Appearance  Alert, conversant  Vascular  Dorsalis pedis and posterior tibial  pulses are palpable  bilaterally.  Capillary return is within normal limits  bilaterally. Temperature is within normal limits  bilaterally.  Neurologic  Senn-Weinstein monofilament wire test diminished.bilaterally. Muscle power within normal limits bilaterally.  Nails Thick disfigured discolored nails with subungual debris  from hallux to fifth toes bilaterally. No evidence of bacterial infection or drainage bilaterally.  Orthopedic  No limitations of motion  feet .  No crepitus or effusions noted.  No bony pathology or digital deformities noted.  Plantar flexed second metatarsal with mallet/hammer toe second  B/L.Hammer toes 2-4  B/L.  Skin  normotropic skin with no porokeratosis noted bilaterally.  No signs of infections or ulcers noted.  Sub 2 callus left forefoot.  Pre-ulcerous callus right forefoot. No infection noted.  Callus left foot.  Pre ulcerous callus right foot.  Diabetes with neuropathy  IE.  Debride callus sub 2  B/l.  Discussed this condition with this patient.  He is to return to the office in 10 weeks  for preventative foot care services.  Patient also qualifies for diabetic shoes due to DPN and hammertoes both feet and an ulcer right  forefoot.  Needs to make an appointment with [facility] for measurement for diabetic shoes.   Gardiner Barefoot DPM

## 2019-03-02 ENCOUNTER — Other Ambulatory Visit: Payer: Medicare HMO | Admitting: Orthotics

## 2019-03-09 ENCOUNTER — Ambulatory Visit: Payer: Medicare HMO | Admitting: Orthotics

## 2019-03-09 ENCOUNTER — Other Ambulatory Visit: Payer: Self-pay

## 2019-03-09 DIAGNOSIS — M79675 Pain in left toe(s): Secondary | ICD-10-CM

## 2019-03-09 DIAGNOSIS — L84 Corns and callosities: Secondary | ICD-10-CM

## 2019-03-09 DIAGNOSIS — M2040 Other hammer toe(s) (acquired), unspecified foot: Secondary | ICD-10-CM

## 2019-03-09 DIAGNOSIS — B351 Tinea unguium: Secondary | ICD-10-CM

## 2019-03-09 NOTE — Progress Notes (Signed)

## 2019-03-13 ENCOUNTER — Other Ambulatory Visit: Payer: Self-pay | Admitting: Internal Medicine

## 2019-03-13 MED ORDER — ROSUVASTATIN CALCIUM 40 MG PO TABS: 40.0000 mg | ORAL_TABLET | Freq: Every day | ORAL | 3 refills | Status: DC

## 2019-03-13 MED ORDER — NITROGLYCERIN 0.4 MG SL SUBL: SUBLINGUAL_TABLET | SUBLINGUAL | 5 refills | Status: DC

## 2019-03-13 MED ORDER — GABAPENTIN 300 MG PO CAPS: 300.0000 mg | ORAL_CAPSULE | Freq: Three times a day (TID) | ORAL | 1 refills | Status: DC

## 2019-03-13 MED ORDER — KLOR-CON M10 10 MEQ PO TBCR: EXTENDED_RELEASE_TABLET | ORAL | 1 refills | Status: DC

## 2019-03-13 MED ORDER — PANTOPRAZOLE SODIUM 40 MG PO TBEC: DELAYED_RELEASE_TABLET | ORAL | 0 refills | Status: DC

## 2019-03-13 MED ORDER — GLIPIZIDE ER 2.5 MG PO TB24: ORAL_TABLET | ORAL | 0 refills | Status: DC

## 2019-03-13 MED ORDER — ALBUTEROL SULFATE HFA 108 (90 BASE) MCG/ACT IN AERS: 2.0000 | INHALATION_SPRAY | Freq: Four times a day (QID) | RESPIRATORY_TRACT | 2 refills | Status: DC | PRN

## 2019-03-13 MED ORDER — FUROSEMIDE 40 MG PO TABS: ORAL_TABLET | ORAL | 0 refills | Status: DC

## 2019-03-13 MED ORDER — TRIAMCINOLONE ACETONIDE 55 MCG/ACT NA AERO: 2.0000 | INHALATION_SPRAY | Freq: Every day | NASAL | 12 refills | Status: DC

## 2019-03-13 MED ORDER — CETIRIZINE HCL 10 MG PO TABS: ORAL_TABLET | ORAL | 2 refills | Status: DC

## 2019-03-13 NOTE — Telephone Encounter (Signed)
Requested medication (s) are due for refill today: yes  Requested medication (s) are on the active medication list: yes  Future visit scheduled: yes  Notes to clinic:  Review for refill Patient would also like Losartan if he is still suppose to take it   Requested Prescriptions  Pending Prescriptions Disp Refills   albuterol (VENTOLIN HFA) 108 (90 Base) MCG/ACT inhaler      Sig: Inhale 2 puffs into the lungs every 6 (six) hours as needed for wheezing or shortness of breath.     Pulmonology:  Beta Agonists Failed - 03/13/2019  9:56 AM      Failed - One inhaler should last at least one month. If the patient is requesting refills earlier, contact the patient to check for uncontrolled symptoms.      Passed - Valid encounter within last 12 months    Recent Outpatient Visits          2 months ago Preventative health care   [facility] -Georges Mouse, MD   3 months ago Foot injury, left, initial encounter   [place], MD   9 months ago Left hip pain   [place], [place], DO   10 months ago CAD S/P multiple PCI   [place] [facility] -Georges Mouse, MD   1 year ago Type 2 diabetes mellitus with other circulatory complication, without long-term current use of insulin [facility])   [place] [facility] -Georges Mouse, MD      Future Appointments            In 3 months [name], Hunt Oris, MD [place], [facility]            furosemide (LASIX) 40 MG tablet 360 tablet 0    Sig: TAKE 2 TABLETS BY MOUTH EVERY MORNING AND 1 TABLET BY MOUTH EVERY EVENING. TAKE AN EXTRA TABLET AS NEEDED FOR WEIGHT GAIN OF 3LBS OR MORE     Cardiovascular:  Diuretics - Loop Failed - 03/13/2019  9:56 AM      Failed - Cr in normal range and within 360 days    Creat  Date Value Ref Range Status  12/27/2015 1.75 (H) 0.70 - 1.33 mg/dL Final    Comment:      For  patients > or = 62 years of age: The upper reference limit for Creatinine is approximately 13% higher for people identified as African-American.      Creatinine, Ser  Date Value Ref Range Status  12/24/2018 1.75 (H) 0.40 - 1.50 mg/dL Final         Failed - Last BP in normal range    BP Readings from Last 1 Encounters:  01/31/19 (!) 173/112         Passed - K in normal range and within 360 days    Potassium  Date Value Ref Range Status  12/24/2018 3.9 3.5 - 5.1 mEq/L Final         Passed - Ca in normal range and within 360 days    Calcium  Date Value Ref Range Status  12/24/2018 9.1 8.4 - 10.5 mg/dL Final         Passed - Na in normal range and within 360 days    Sodium  Date Value Ref Range Status  12/24/2018 143 135 - 145 mEq/L Final         Passed -  Valid encounter within last 6 months    Recent Outpatient Visits          2 months ago Preventative health care   [name], James W, MD   3 months ago Foot injury, left, initial encounter   [place], MD   9 months ago Left hip pain   [place], [place], DO   10 months ago CAD S/P multiple PCI   [facility] -Georges Mouse, MD   1 year ago Type 2 diabetes mellitus with other circulatory complication, without long-term current use of insulin [facility])   [place], James W, MD      Future Appointments            In 3 months Biagio Borg, MD [place], [facility]            gabapentin (NEURONTIN) 300 MG capsule 270 capsule 1    Sig: Take 1 capsule (300 mg total) by mouth 3 (three) times daily.     Neurology: Anticonvulsants - gabapentin Passed - 03/13/2019  9:56 AM      Passed - Valid encounter within last 12 months    Recent Outpatient Visits          2 months ago Preventative health care   [place] [name], James W, MD   3 months ago Foot injury, left, initial encounter   [place], MD   9 months ago Left hip pain   [place], [place], DO   10 months ago CAD S/P multiple PCI   [facility] -Georges Mouse, MD   1 year ago Type 2 diabetes mellitus with other circulatory complication, without long-term current use of insulin [facility])   [place] [name], James W, MD      Future Appointments            In 3 months [name] Hunt Oris, MD [place], [facility]            KLOR-CON M10 10 MEQ tablet        Endocrinology:  Minerals - Potassium Supplementation Failed - 03/13/2019  9:56 AM      Failed - Cr in normal range and within 360 days    Creat  Date Value Ref Range Status  12/27/2015 1.75 (H) 0.70 - 1.33 mg/dL Final    Comment:      For patients > or = 62 years of age: The upper reference limit for Creatinine is approximately 13% higher for people identified as African-American.      Creatinine, Ser  Date Value Ref Range Status  12/24/2018 1.75 (H) 0.40 - 1.50 mg/dL Final         Passed - K in normal range and within 360 days    Potassium  Date Value Ref Range Status  12/24/2018 3.9 3.5 - 5.1 mEq/L Final         Passed - Valid encounter within last 12 months    Recent Outpatient Visits          2 months ago Preventative health care   [facility] -Georges Mouse, MD   3 months ago Foot injury, left, initial encounter   [facility] -Genene Churn, MD   9 months ago Left hip pain   [place], [place], DO   10 months ago CAD S/P multiple PCI   [facility] -Georges Mouse, MD   1 year ago Type 2 diabetes mellitus with other circulatory complication, without long-term current use of insulin [facility])   [place] [facility] -Georges Mouse, MD      Future Appointments            In 3 months [name], Hunt Oris, MD [place] [facility], [facility]            cetirizine (ZYRTEC) 10 MG tablet 30 tablet 2    Sig: TAKE 1 TABLET(10 MG) BY MOUTH DAILY     Ear, Nose, and Throat:  Antihistamines Passed - 03/13/2019  9:56 AM      Passed - Valid encounter within last 12 months    Recent Outpatient Visits          2 months ago Preventative health care   [place] [name], James W, MD   3 months ago Foot injury, left, initial encounter   [place], MD   9 months ago Left hip pain   [place], [place], DO   10 months ago CAD S/P multiple PCI   [facility] -Georges Mouse, MD   1 year ago Type 2 diabetes mellitus with other circulatory complication, without long-term current use of insulin [facility])   [place] [facility] -Georges Mouse, MD      Future Appointments            In 3 months Biagio Borg, MD [place], [facility]            glipiZIDE (GLUCOTROL XL) 2.5 MG 24 hr tablet 90 tablet 0     Endocrinology:  Diabetes - Sulfonylureas Passed - 03/13/2019  9:56 AM      Passed - HBA1C is between 0 and 7.9 and within 180 days    Hgb A1c MFr Bld  Date Value Ref Range Status  12/24/2018 7.1 (H) 4.6 - 6.5 % Final    Comment:    Glycemic Control Guidelines for People with Diabetes:Non Diabetic:  <6%Goal of Therapy: <7%Additional Action Suggested:  >8%          Passed - Valid encounter within last 6 months    Recent Outpatient Visits          2 months ago Preventative health care   [place], James W, MD   3 months ago Foot injury, left, initial encounter   [place], MD   9 months ago Left hip pain   [place], [place], DO   10 months ago CAD S/P multiple PCI   [facility] -Georges Mouse, MD   1 year ago Type 2 diabetes mellitus with other circulatory complication, without long-term current use of insulin [facility])   [place] [facility] -Georges Mouse, MD      Future Appointments            In 3 months [name], Hunt Oris, MD [place], [facility]  pantoprazole (PROTONIX) 40 MG tablet 90 tablet 0     Gastroenterology: Proton Pump Inhibitors Passed - 03/13/2019  9:56 AM      Passed - Valid encounter within last 12 months    Recent Outpatient Visits          2 months ago Preventative health care   [place] [name], James W, MD   3 months ago Foot injury, left, initial encounter   [place], MD   9 months ago Left hip pain   [place], [place], DO   10 months ago CAD S/P multiple PCI   [facility] -Georges Mouse, MD   1 year ago Type 2 diabetes mellitus with other circulatory complication, without long-term current use of insulin [facility])   [place], James W, MD      Future Appointments            In 3 months [name] Hunt Oris, MD [place] [facility], [facility]            nitroGLYCERIN (NITROSTAT) 0.4 MG SL tablet 25 tablet 5    Sig: PLACE 1 TABLET UNDER THE TONGUE EVERY 5 MINUTES AS NEEDED FOR CHEST PAIN     Cardiovascular:  Nitrates Failed - 03/13/2019  9:56 AM      Failed - Last BP in normal range    BP Readings from Last 1 Encounters:  01/31/19 (!) 173/112         Passed - Last Heart Rate in normal range    Pulse Readings from Last 1 Encounters:  01/31/19 88         Passed - Valid encounter within last 12 months    Recent Outpatient Visits          2 months ago Preventative health care   [facility]  -Georges Mouse, MD   3 months ago Foot injury, left, initial encounter   [place], MD   9 months ago Left hip pain   [place], [place], DO   10 months ago CAD S/P multiple PCI   [facility], James W, MD   1 year ago Type 2 diabetes mellitus with other circulatory complication, without long-term current use of insulin [facility])   [place], MD      Future Appointments            In 3 months [name], Hunt Oris, MD [place] [facility], [facility]            triamcinolone (NASACORT) 55 MCG/ACT AERO nasal inhaler 1 Inhaler 12    Sig: Place 2 sprays into the nose daily.     Ear, Nose, and Throat: Nasal Preparations - Corticosteroids Passed - 03/13/2019  9:56 AM      Passed - Valid encounter within last 12 months    Recent Outpatient Visits          2 months ago Preventative health care   [name], James W, MD   3 months ago Foot injury, left, initial encounter   [place], MD   9 months ago Left hip pain   [place], [place], DO  10 months ago CAD S/P multiple PCI   [place] [facility] -Georges Mouse, MD   1 year ago Type 2 diabetes mellitus with other circulatory complication, without long-term current use of insulin [facility])   [place], James W, MD      Future Appointments            In 3 months [name] Hunt Oris, MD [place], [facility]            rosuvastatin (CRESTOR) 40 MG tablet 90 tablet 3    Sig: Take 1 tablet (40 mg total) by mouth daily.     Cardiovascular:  Antilipid - Statins Passed - 03/13/2019  9:56 AM      Passed - Total Cholesterol in normal range and within 360 days    Cholesterol  Date Value Ref Range Status   12/24/2018 114 0 - 200 mg/dL Final    Comment:    ATP III Classification       Desirable:  < 200 mg/dL               Borderline High:  200 - 239 mg/dL          High:  > = 240 mg/dL         Passed - LDL in normal range and within 360 days    LDL Cholesterol  Date Value Ref Range Status  12/24/2018 54 0 - 99 mg/dL Final         Passed - HDL in normal range and within 360 days    HDL  Date Value Ref Range Status  12/24/2018 49.70 >39.00 mg/dL Final         Passed - Triglycerides in normal range and within 360 days    Triglycerides  Date Value Ref Range Status  12/24/2018 55.0 0.0 - 149.0 mg/dL Final    Comment:    Normal:  <150 mg/dLBorderline High:  150 - 199 mg/dL         Passed - Patient is not pregnant      Passed - Valid encounter within last 12 months    Recent Outpatient Visits          2 months ago Preventative health care   [facility] -Georges Mouse, MD   3 months ago Foot injury, left, initial encounter   [place], MD   9 months ago Left hip pain   [place], [place], DO   10 months ago CAD S/P multiple PCI   [place], James W, MD   1 year ago Type 2 diabetes mellitus with other circulatory complication, without long-term current use of insulin [facility])   [place] [facility] -Georges Mouse, MD      Future Appointments            In 3 months [name], Hunt Oris, MD [place], [facility]

## 2019-03-13 NOTE — Telephone Encounter (Signed)
Pt request refill   albuterol (PROVENTIL HFA;VENTOLIN HFA) 108 (90 Base) MCG/ACT inhaler cetirizine (ZYRTEC) 10 MG tablet furosemide (LASIX) 40 MG tablet gabapentin (NEURONTIN) 300 MG capsule glipiZIDE (GLUCOTROL XL) 2.5 MG 24 hr tablet potassium chloride (K-DUR) 10 MEQ tablet nitroGLYCERIN (NITROSTAT) 0.4 MG SL tablet pantoprazole (PROTONIX) 40 MG tablet rosuvastatin (CRESTOR) 40 MG tablet triamcinolone (NASACORT) 55 MCG/ACT AERO nasal inhaler  Pt wants to know is he still on losartan (COZAAR) 100 MG tablet.  If so please refill too.  Dr Tamala Julian reflls diclofenac sodium (VOLTAREN) 1 % GEL  Pt went through his med list with me one by one.  Pt states he called the pharmacy over a week ago and he is OUT of everything. Can you send to  [place] [id number] [place], [place] [phone] (Phone) [phone] (Fax)

## 2019-04-29 ENCOUNTER — Ambulatory Visit: Payer: Medicare HMO

## 2019-05-12 DIAGNOSIS — R0600 Dyspnea, unspecified: Secondary | ICD-10-CM | POA: Diagnosis not present

## 2019-05-12 DIAGNOSIS — R778 Other specified abnormalities of plasma proteins: Secondary | ICD-10-CM | POA: Diagnosis not present

## 2019-05-12 DIAGNOSIS — I110 Hypertensive heart disease with heart failure: Secondary | ICD-10-CM | POA: Diagnosis not present

## 2019-05-12 DIAGNOSIS — R0602 Shortness of breath: Secondary | ICD-10-CM | POA: Diagnosis not present

## 2019-05-12 DIAGNOSIS — R0789 Other chest pain: Secondary | ICD-10-CM | POA: Diagnosis not present

## 2019-05-12 DIAGNOSIS — Z79899 Other long term (current) drug therapy: Secondary | ICD-10-CM | POA: Diagnosis not present

## 2019-05-12 DIAGNOSIS — R000 Tachycardia, unspecified: Secondary | ICD-10-CM | POA: Diagnosis not present

## 2019-05-12 DIAGNOSIS — I5033 Acute on chronic diastolic (congestive) heart failure: Secondary | ICD-10-CM | POA: Diagnosis not present

## 2019-05-12 DIAGNOSIS — Z23 Encounter for immunization: Secondary | ICD-10-CM | POA: Diagnosis not present

## 2019-05-12 DIAGNOSIS — R079 Chest pain, unspecified: Secondary | ICD-10-CM | POA: Diagnosis not present

## 2019-05-12 DIAGNOSIS — N1831 Chronic kidney disease, stage 3a: Secondary | ICD-10-CM | POA: Diagnosis not present

## 2019-05-12 DIAGNOSIS — I2510 Atherosclerotic heart disease of native coronary artery without angina pectoris: Secondary | ICD-10-CM | POA: Diagnosis not present

## 2019-05-12 DIAGNOSIS — Z6841 Body Mass Index (BMI) 40.0 and over, adult: Secondary | ICD-10-CM | POA: Diagnosis not present

## 2019-05-12 DIAGNOSIS — I509 Heart failure, unspecified: Secondary | ICD-10-CM | POA: Diagnosis not present

## 2019-05-12 DIAGNOSIS — I491 Atrial premature depolarization: Secondary | ICD-10-CM | POA: Diagnosis not present

## 2019-05-12 DIAGNOSIS — I130 Hypertensive heart and chronic kidney disease with heart failure and stage 1 through stage 4 chronic kidney disease, or unspecified chronic kidney disease: Secondary | ICD-10-CM | POA: Diagnosis not present

## 2019-05-12 DIAGNOSIS — Z87891 Personal history of nicotine dependence: Secondary | ICD-10-CM | POA: Diagnosis not present

## 2019-05-12 DIAGNOSIS — I2582 Chronic total occlusion of coronary artery: Secondary | ICD-10-CM | POA: Diagnosis not present

## 2019-05-13 ENCOUNTER — Ambulatory Visit: Payer: Medicare HMO | Admitting: Podiatry

## 2019-05-13 DIAGNOSIS — R0600 Dyspnea, unspecified: Secondary | ICD-10-CM | POA: Diagnosis not present

## 2019-05-13 DIAGNOSIS — Z6841 Body Mass Index (BMI) 40.0 and over, adult: Secondary | ICD-10-CM | POA: Diagnosis not present

## 2019-05-13 DIAGNOSIS — I517 Cardiomegaly: Secondary | ICD-10-CM | POA: Diagnosis not present

## 2019-05-13 DIAGNOSIS — I2510 Atherosclerotic heart disease of native coronary artery without angina pectoris: Secondary | ICD-10-CM | POA: Insufficient documentation

## 2019-05-13 DIAGNOSIS — I110 Hypertensive heart disease with heart failure: Secondary | ICD-10-CM | POA: Diagnosis not present

## 2019-05-13 DIAGNOSIS — I491 Atrial premature depolarization: Secondary | ICD-10-CM | POA: Diagnosis not present

## 2019-05-13 DIAGNOSIS — R778 Other specified abnormalities of plasma proteins: Secondary | ICD-10-CM | POA: Diagnosis not present

## 2019-05-13 DIAGNOSIS — G4733 Obstructive sleep apnea (adult) (pediatric): Secondary | ICD-10-CM | POA: Diagnosis not present

## 2019-05-13 DIAGNOSIS — I4510 Unspecified right bundle-branch block: Secondary | ICD-10-CM | POA: Diagnosis not present

## 2019-05-13 DIAGNOSIS — R9431 Abnormal electrocardiogram [ECG] [EKG]: Secondary | ICD-10-CM | POA: Diagnosis not present

## 2019-05-13 DIAGNOSIS — I5032 Chronic diastolic (congestive) heart failure: Secondary | ICD-10-CM | POA: Insufficient documentation

## 2019-05-13 DIAGNOSIS — N1831 Chronic kidney disease, stage 3a: Secondary | ICD-10-CM | POA: Insufficient documentation

## 2019-05-14 DIAGNOSIS — N183 Chronic kidney disease, stage 3 unspecified: Secondary | ICD-10-CM | POA: Diagnosis not present

## 2019-05-14 DIAGNOSIS — I5022 Chronic systolic (congestive) heart failure: Secondary | ICD-10-CM | POA: Diagnosis not present

## 2019-05-14 DIAGNOSIS — T82855A Stenosis of coronary artery stent, initial encounter: Secondary | ICD-10-CM | POA: Diagnosis not present

## 2019-05-14 DIAGNOSIS — I2510 Atherosclerotic heart disease of native coronary artery without angina pectoris: Secondary | ICD-10-CM | POA: Diagnosis not present

## 2019-05-14 DIAGNOSIS — I110 Hypertensive heart disease with heart failure: Secondary | ICD-10-CM | POA: Diagnosis not present

## 2019-05-14 DIAGNOSIS — I2582 Chronic total occlusion of coronary artery: Secondary | ICD-10-CM | POA: Diagnosis not present

## 2019-05-15 ENCOUNTER — Ambulatory Visit: Payer: Medicare HMO | Admitting: Orthotics

## 2019-05-15 DIAGNOSIS — I509 Heart failure, unspecified: Secondary | ICD-10-CM | POA: Diagnosis not present

## 2019-05-15 DIAGNOSIS — I2582 Chronic total occlusion of coronary artery: Secondary | ICD-10-CM | POA: Diagnosis not present

## 2019-05-15 DIAGNOSIS — J449 Chronic obstructive pulmonary disease, unspecified: Secondary | ICD-10-CM | POA: Diagnosis not present

## 2019-05-15 DIAGNOSIS — G4733 Obstructive sleep apnea (adult) (pediatric): Secondary | ICD-10-CM | POA: Diagnosis not present

## 2019-05-15 DIAGNOSIS — F329 Major depressive disorder, single episode, unspecified: Secondary | ICD-10-CM | POA: Diagnosis not present

## 2019-05-15 DIAGNOSIS — N183 Chronic kidney disease, stage 3 unspecified: Secondary | ICD-10-CM | POA: Diagnosis not present

## 2019-05-15 DIAGNOSIS — I2510 Atherosclerotic heart disease of native coronary artery without angina pectoris: Secondary | ICD-10-CM | POA: Diagnosis not present

## 2019-05-15 MED ORDER — PCCA BIOPEPTIDE BASE EX CREA
300.00 | TOPICAL_CREAM | CUTANEOUS | Status: DC
Start: 2019-05-15 — End: 2019-05-15

## 2019-05-15 MED ORDER — ACCU-PRO PUMP SET/VENT MISC
2.50 | Status: DC
Start: ? — End: 2019-05-15

## 2019-05-15 MED ORDER — SUBDUE PLUS PO LIQD
75.00 | ORAL | Status: DC
Start: 2019-05-16 — End: 2019-05-15

## 2019-05-15 MED ORDER — Medication
30.00 | Status: DC
Start: 2019-05-15 — End: 2019-05-15

## 2019-05-15 MED ORDER — EQUATE NICOTINE 4 MG MT GUM
4.00 | CHEWING_GUM | OROMUCOSAL | Status: DC
Start: ? — End: 2019-05-15

## 2019-05-15 MED ORDER — QUINERVA 260 MG PO TABS
650.00 | ORAL_TABLET | ORAL | Status: DC
Start: ? — End: 2019-05-15

## 2019-05-15 MED ORDER — SODIUM CHLORIDE 0.9 % IV SOLN
INTRAVENOUS | Status: DC
Start: ? — End: 2019-05-15

## 2019-05-15 MED ORDER — ALOXI 0.25 MG/5ML IV SOLN
20.00 | INTRAVENOUS | Status: DC
Start: 2019-05-16 — End: 2019-05-15

## 2019-05-15 MED ORDER — ATORVASTATIN CALCIUM 40 MG PO TABS
80.00 | ORAL_TABLET | ORAL | Status: DC
Start: 2019-05-15 — End: 2019-05-15

## 2019-05-15 MED ORDER — ASSURE II CONTROL LEVEL 1 & 2 VI
12.50 | Status: DC
Start: 2019-05-15 — End: 2019-05-15

## 2019-05-15 MED ORDER — LURASIDONE HCL 40 MG PO TABS
100.00 | ORAL_TABLET | ORAL | Status: DC
Start: ? — End: 2019-05-15

## 2019-05-15 MED ORDER — BAYER WOMENS 81-300 MG PO TABS
40.00 | ORAL_TABLET | ORAL | Status: DC
Start: 2019-05-16 — End: 2019-05-15

## 2019-05-15 MED ORDER — SPINAL NEEDLE (REUSABLE) 22G X 3-1/2" MISC
20.00 | Status: DC
Start: 2019-05-15 — End: 2019-05-15

## 2019-05-15 MED ORDER — FERROSOFERRIC OXIDE
150.00 | Status: DC
Start: 2019-05-15 — End: 2019-05-15

## 2019-05-15 MED ORDER — BARO-CAT PO
ORAL | Status: DC
Start: ? — End: 2019-05-15

## 2019-05-15 MED ORDER — Medication
90.00 | Status: DC
Start: 2019-05-16 — End: 2019-05-15

## 2019-05-15 MED ORDER — Medication
0.30 | Status: DC
Start: 2019-05-15 — End: 2019-05-15

## 2019-05-15 MED ORDER — TRAMADOL HCL 50 MG PO TABS
50.00 | ORAL_TABLET | ORAL | Status: DC
Start: ? — End: 2019-05-15

## 2019-05-15 MED ORDER — Medication
20.00 | Status: DC
Start: 2019-05-15 — End: 2019-05-15

## 2019-05-15 MED ORDER — ASPIRIN 81 MG PO CHEW
81.00 | CHEWABLE_TABLET | ORAL | Status: DC
Start: 2019-05-16 — End: 2019-05-15

## 2019-05-18 ENCOUNTER — Telehealth: Payer: Self-pay | Admitting: *Deleted

## 2019-05-18 NOTE — Telephone Encounter (Signed)
Rec,d msg off patient pt presented 05/12/19 [facility] emergency w/ SOB sxs. On12/02/20 @ 4:04 AM Admitted for observation. On 05/15/19 @ 12:48 PM Discharged from [facility] observation to Hospital. Once pt has been d/c will follow=up with pt to make hosp f/u w/PCP.Marland KitchenJohny Chess

## 2019-05-25 ENCOUNTER — Ambulatory Visit (INDEPENDENT_AMBULATORY_CARE_PROVIDER_SITE_OTHER): Payer: Medicare HMO | Admitting: Orthotics

## 2019-05-25 ENCOUNTER — Other Ambulatory Visit: Payer: Self-pay

## 2019-05-25 DIAGNOSIS — L84 Corns and callosities: Secondary | ICD-10-CM

## 2019-05-25 DIAGNOSIS — M2041 Other hammer toe(s) (acquired), right foot: Secondary | ICD-10-CM | POA: Diagnosis not present

## 2019-05-25 DIAGNOSIS — M2040 Other hammer toe(s) (acquired), unspecified foot: Secondary | ICD-10-CM

## 2019-05-25 DIAGNOSIS — E1140 Type 2 diabetes mellitus with diabetic neuropathy, unspecified: Secondary | ICD-10-CM | POA: Diagnosis not present

## 2019-05-25 DIAGNOSIS — M2042 Other hammer toe(s) (acquired), left foot: Secondary | ICD-10-CM

## 2019-05-25 NOTE — Progress Notes (Signed)

## 2019-06-19 ENCOUNTER — Other Ambulatory Visit: Payer: Self-pay | Admitting: Internal Medicine

## 2019-06-19 NOTE — Telephone Encounter (Signed)
Done erx 

## 2019-06-22 ENCOUNTER — Encounter: Payer: Self-pay | Admitting: Internal Medicine

## 2019-06-22 ENCOUNTER — Ambulatory Visit (INDEPENDENT_AMBULATORY_CARE_PROVIDER_SITE_OTHER): Payer: Medicare HMO | Admitting: Internal Medicine

## 2019-06-22 DIAGNOSIS — E1159 Type 2 diabetes mellitus with other circulatory complications: Secondary | ICD-10-CM

## 2019-06-22 DIAGNOSIS — R0600 Dyspnea, unspecified: Secondary | ICD-10-CM

## 2019-06-22 DIAGNOSIS — R0602 Shortness of breath: Secondary | ICD-10-CM

## 2019-06-22 DIAGNOSIS — I5042 Chronic combined systolic (congestive) and diastolic (congestive) heart failure: Secondary | ICD-10-CM

## 2019-06-22 MED ORDER — ALBUTEROL SULFATE HFA 108 (90 BASE) MCG/ACT IN AERS: 2.0000 | INHALATION_SPRAY | Freq: Four times a day (QID) | RESPIRATORY_TRACT | 11 refills | Status: DC | PRN

## 2019-06-22 MED ORDER — FLUTICASONE-SALMETEROL 250-50 MCG/DOSE IN AEPB: 1.0000 | INHALATION_SPRAY | Freq: Two times a day (BID) | RESPIRATORY_TRACT | 11 refills | Status: DC

## 2019-06-22 MED ORDER — PREDNISONE 10 MG PO TABS: ORAL_TABLET | ORAL | 0 refills | Status: DC

## 2019-06-22 NOTE — Assessment & Plan Note (Signed)
see notes

## 2019-06-22 NOTE — Assessment & Plan Note (Signed)
stable overall by history and exam, recent data reviewed with pt, and pt to continue medical treatment as before,  to f/u any worsening symptoms or concerns  

## 2019-06-22 NOTE — Assessment & Plan Note (Signed)
Stable, f/u cards

## 2019-06-22 NOTE — Progress Notes (Signed)
Patient ID: [name] [name], male   DOB: [birth year], 64 y.o.   MRN: [id number]  Phone visit  Cumulative time during 7-day interval 18 min, there was not an associated office visit for this concern within a 7 day period.\  Verbal consent for services obtained from patient prior to services given.  Names of all persons present for services: Cathlean Cower, MD, patient  Chief complaint: dyspnea  History, background, results pertinent:  Here with c/o 2 mo intermittent sob, sort of like "beathing through a straw" sort of like the asthma he had as a child, and allergies have been worse as well recently with nasal congestion without fever or pain.  Pt denies chest pain, orthopnea, PND, increased LE swelling, palpitations, dizziness or syncope.  Last cardiology f/u was > 1 yr per pt.  Diuretic recently changed to torsemide and states it is not working as well as the previous but o/w cant be specific, has not gained wt or leg swelling.  Pt denies new neurological symptoms such as new headache, or facial or extremity weakness or numbness   Pt denies polydipsia, polyuria or fever Past Medical History:  Diagnosis Date  . CAD (coronary artery disease)    a. s/p PCI/BMS pRCA 2002; b. PCI pRCA & DES p/m RCA 2006; c. PCI/DES OM4 2007; d. PCI/CBA to RCA for ISR 10/2006; e.08/2012 STEMI/Cath/PCI:  LAD 95% >> PCI: 3.0x20 Promus Prem DES  //  f. NSTEMI 10/15 >> LHC: mLAD stent ok, dLAD 70, OM1 CTO, OM2 stent ok, dOM2 90, OM3 30-40, dLCx 90, p-mRCA stent ok, mRCA stent ok w/ 60-70 ISR, EF 50% >> med Rx  . Chronic combined systolic and diastolic CHF (congestive heart failure) ([place])    a. Myoview 1/17: EF 35%  //  b. Echo 1/17 EF 45-50%  . Depression   . Diabetes mellitus ([place])    a. A1c 8.8 08/2012->Metformin initiated. => b. A1c (9/14): 6.6  . GERD (gastroesophageal reflux disease)   . History of nuclear stress test    a. Myoview 1/17: EF 35%, fixed inferior lateral defect consistent with infarct, no ischemia, intermediate  risk  . History of pneumonia   . HTN (hypertension)   . Hyperlipidemia   . Ischemic cardiomyopathy    a. EF 40%; improved to normal;  b. 08/2012 Echo: EF 50-55%, mod LVH.//  c. Echo 9/16: Inf HK, mild LVH, EF 55%, mild LAE, normal RVF, mild RAE, PASP 35 mmHg  //  d. Echo 1/17: EF 45-50%, inferior HK, mild BAE, PASP 33 mmHg  . NSTEMI (non-ST elevated myocardial infarction) ([place])   . Obesity   . OSA (obstructive sleep apnea)    Does not use CPAP as of 05/2011  . Tobacco abuse    No results found for this or any previous visit (from the past 48 hour(s)). Current Outpatient Medications on File Prior to Visit  Medication Sig Dispense Refill  . aspirin 81 MG tablet Take 81 mg by mouth daily.    [name] buPROPion (WELLBUTRIN XL) 300 MG 24 hr tablet Take 1 tablet (300 mg total) by mouth daily. 90 tablet 3  . carvedilol (COREG) 12.5 MG tablet Take 1 tablet (12.5 mg total) by mouth 2 (two) times daily with a meal. 180 tablet 3  . cetirizine (ZYRTEC) 10 MG tablet TAKE 1 TABLET(10 MG) BY MOUTH DAILY 30 tablet 2  . cloNIDine (CATAPRES) 0.3 MG tablet Take 1 tablet (0.3 mg total) by mouth 2 (two) times daily. 180 tablet 3  . clopidogrel (  PLAVIX) 75 MG tablet TAKE 1 TABLET(75 MG) BY MOUTH DAILY 90 tablet 1  . diclofenac sodium (VOLTAREN) 1 % GEL Apply 2 g topically 4 (four) times daily. To affected joint. 100 g 11  . furosemide (LASIX) 40 MG tablet TAKE 2 TABLETS BY MOUTH EVERY MORNING AND 1 TABLET BY MOUTH EVERY EVENING. TAKE AN EXTRA TABLET AS NEEDED FOR WEIGHT GAIN OF 3LBS OR MORE 360 tablet 0  . gabapentin (NEURONTIN) 300 MG capsule Take 1 capsule (300 mg total) by mouth 3 (three) times daily. 270 capsule 1  . glipiZIDE (GLUCOTROL XL) 2.5 MG 24 hr tablet TAKE 1 TABLET(2.5 MG) BY MOUTH DAILY WITH BREAKFAST 90 tablet 0  . isosorbide mononitrate (IMDUR) 30 MG 24 hr tablet TAKE 3 TABLETS(90 MG) BY MOUTH DAILY 270 tablet 1  . KLOR-CON M10 10 MEQ tablet TK 2 TS PO QAM AND 1 T QPM 90 tablet 1  . lactulose  (CHRONULAC) 10 GM/15ML solution TAKE 30 ML BY MOUTH TWICE DAILY AS NEEDED 6081 mL 1  . losartan (COZAAR) 100 MG tablet TAKE 1 TABLET(100 MG) BY MOUTH DAILY 90 tablet 0  . Multiple Vitamins-Minerals (MULTIVITAMIN WITH MINERALS) tablet Take 1 tablet by mouth daily. CENTRUM SILVER MENS  50 PLUS    . nitroGLYCERIN (NITROSTAT) 0.4 MG SL tablet PLACE 1 TABLET UNDER THE TONGUE EVERY 5 MINUTES AS NEEDED FOR CHEST PAIN 25 tablet 5  . pantoprazole (PROTONIX) 40 MG tablet TAKE 1 TABLET(40 MG) BY MOUTH DAILY 90 tablet 0  . PARoxetine (PAXIL) 20 MG tablet Take 1 tablet (20 mg total) by mouth daily. 90 tablet 3  . potassium chloride (K-DUR) 10 MEQ tablet TAKE 2 TABLETS BY MOUTH EVERY MORNING AND 1 TABLET BY MOUTH EVERY EVENING 90 tablet 0  . rosuvastatin (CRESTOR) 40 MG tablet Take 1 tablet (40 mg total) by mouth daily. 90 tablet 3  . traMADol (ULTRAM) 50 MG tablet TAKE 1 TABLET(50 MG) BY MOUTH EVERY 6 HOURS AS NEEDED 120 tablet 5  . triamcinolone (NASACORT) 55 MCG/ACT AERO nasal inhaler Place 2 sprays into the nose daily. 1 Inhaler 12  . Vitamin D, Ergocalciferol, (DRISDOL) 1.25 MG (50000 UT) CAPS capsule Take 1 capsule (50,000 Units total) by mouth every 7 (seven) days. 12 capsule 0   No current facility-administered medications on file prior to visit.   Lab Results  Component Value Date   WBC 5.7 12/24/2018   HGB 14.8 12/24/2018   HCT 45.7 12/24/2018   PLT 195.0 12/24/2018   GLUCOSE 105 (H) 12/24/2018   CHOL 114 12/24/2018   TRIG 55.0 12/24/2018   HDL 49.70 12/24/2018   LDLCALC 54 12/24/2018   ALT 15 12/24/2018   AST 18 12/24/2018   NA 143 12/24/2018   K 3.9 12/24/2018   CL 106 12/24/2018   CREATININE 1.75 (H) 12/24/2018   BUN 15 12/24/2018   CO2 29 12/24/2018   TSH 1.47 12/24/2018   PSA 1.28 12/24/2018   INR 1.00 05/29/2017   HGBA1C 7.1 (H) 12/24/2018   MICROALBUR 49.0 (H) 05/14/2017   A/P/next steps:   Dyspnea - likely asthma, for prednisone taper, add generic advair, and cont  albuterol hfa prn  Cathlean Cower MD

## 2019-06-22 NOTE — Patient Instructions (Signed)
Please take all new medication as prescribed - the prednisone and steroid inhaler  Please continue all other medications as before, and refills have been done if requested - the albuterol inhaler  Please have the pharmacy call with any other refills you may need.  Please continue your efforts at being more active, low cholesterol diet, and weight control.  Please keep your appointments with your specialists as you may have planned

## 2019-06-26 ENCOUNTER — Other Ambulatory Visit: Payer: Self-pay | Admitting: Internal Medicine

## 2019-06-26 ENCOUNTER — Ambulatory Visit: Payer: Medicare HMO | Admitting: Internal Medicine

## 2019-06-30 ENCOUNTER — Ambulatory Visit: Payer: Medicare HMO | Admitting: Podiatry

## 2019-07-13 ENCOUNTER — Other Ambulatory Visit: Payer: Self-pay | Admitting: Internal Medicine

## 2019-07-13 DIAGNOSIS — I2510 Atherosclerotic heart disease of native coronary artery without angina pectoris: Secondary | ICD-10-CM

## 2019-08-25 ENCOUNTER — Emergency Department (HOSPITAL_COMMUNITY)
Admission: EM | Admit: 2019-08-25 | Discharge: 2019-08-26 | Disposition: A | Discharge status: Home / Self Care | Payer: Medicare HMO | Attending: Emergency Medicine | Admitting: Emergency Medicine

## 2019-08-25 ENCOUNTER — Encounter (HOSPITAL_COMMUNITY): Payer: Self-pay

## 2019-08-25 ENCOUNTER — Ambulatory Visit (INDEPENDENT_AMBULATORY_CARE_PROVIDER_SITE_OTHER)
Admission: EM | Admit: 2019-08-25 | Discharge: 2019-08-25 | Disposition: A | Discharge status: Home / Self Care | Payer: Medicare HMO | Source: Home / Self Care

## 2019-08-25 ENCOUNTER — Other Ambulatory Visit: Payer: Self-pay

## 2019-08-25 ENCOUNTER — Emergency Department (HOSPITAL_COMMUNITY): Payer: Medicare HMO

## 2019-08-25 ENCOUNTER — Encounter (HOSPITAL_COMMUNITY): Payer: Self-pay | Admitting: Emergency Medicine

## 2019-08-25 DIAGNOSIS — R079 Chest pain, unspecified: Secondary | ICD-10-CM

## 2019-08-25 DIAGNOSIS — I5043 Acute on chronic combined systolic (congestive) and diastolic (congestive) heart failure: Secondary | ICD-10-CM

## 2019-08-25 DIAGNOSIS — R42 Dizziness and giddiness: Secondary | ICD-10-CM | POA: Diagnosis not present

## 2019-08-25 DIAGNOSIS — Z9861 Coronary angioplasty status: Secondary | ICD-10-CM | POA: Diagnosis not present

## 2019-08-25 DIAGNOSIS — E1122 Type 2 diabetes mellitus with diabetic chronic kidney disease: Secondary | ICD-10-CM | POA: Diagnosis not present

## 2019-08-25 DIAGNOSIS — W19XXXA Unspecified fall, initial encounter: Secondary | ICD-10-CM

## 2019-08-25 DIAGNOSIS — I499 Cardiac arrhythmia, unspecified: Secondary | ICD-10-CM | POA: Diagnosis not present

## 2019-08-25 DIAGNOSIS — F329 Major depressive disorder, single episode, unspecified: Secondary | ICD-10-CM

## 2019-08-25 DIAGNOSIS — R0602 Shortness of breath: Secondary | ICD-10-CM | POA: Diagnosis not present

## 2019-08-25 DIAGNOSIS — R0789 Other chest pain: Secondary | ICD-10-CM | POA: Diagnosis not present

## 2019-08-25 DIAGNOSIS — N183 Chronic kidney disease, stage 3 unspecified: Secondary | ICD-10-CM | POA: Diagnosis not present

## 2019-08-25 DIAGNOSIS — R9431 Abnormal electrocardiogram [ECG] [EKG]: Secondary | ICD-10-CM

## 2019-08-25 DIAGNOSIS — Z7984 Long term (current) use of oral hypoglycemic drugs: Secondary | ICD-10-CM | POA: Insufficient documentation

## 2019-08-25 DIAGNOSIS — I130 Hypertensive heart and chronic kidney disease with heart failure and stage 1 through stage 4 chronic kidney disease, or unspecified chronic kidney disease: Secondary | ICD-10-CM | POA: Diagnosis not present

## 2019-08-25 DIAGNOSIS — I110 Hypertensive heart disease with heart failure: Secondary | ICD-10-CM | POA: Diagnosis not present

## 2019-08-25 DIAGNOSIS — F32A Depression, unspecified: Secondary | ICD-10-CM

## 2019-08-25 DIAGNOSIS — Z20822 Contact with and (suspected) exposure to covid-19: Secondary | ICD-10-CM | POA: Insufficient documentation

## 2019-08-25 DIAGNOSIS — I2510 Atherosclerotic heart disease of native coronary artery without angina pectoris: Secondary | ICD-10-CM | POA: Diagnosis not present

## 2019-08-25 DIAGNOSIS — Z79899 Other long term (current) drug therapy: Secondary | ICD-10-CM | POA: Insufficient documentation

## 2019-08-25 DIAGNOSIS — R296 Repeated falls: Secondary | ICD-10-CM | POA: Diagnosis not present

## 2019-08-25 LAB — BASIC METABOLIC PANEL
Anion gap: 10 (ref 5–15)
BUN: 16 mg/dL (ref 8–23)
CO2: 30 mmol/L (ref 22–32)
Calcium: 8.9 mg/dL (ref 8.9–10.3)
Chloride: 104 mmol/L (ref 98–111)
Creatinine, Ser: 1.43 mg/dL — ABNORMAL HIGH (ref 0.61–1.24)
GFR calc Af Amer: >60 mL/min (ref >60)
GFR calc non Af Amer: 52 mL/min — ABNORMAL LOW (ref >60)
Glucose, Bld: 124 mg/dL — ABNORMAL HIGH (ref 70–99)
Potassium: 3.2 mmol/L — ABNORMAL LOW (ref 3.5–5.1)
Sodium: 144 mmol/L (ref 135–145)

## 2019-08-25 LAB — BRAIN NATRIURETIC PEPTIDE: B Natriuretic Peptide: 119.8 pg/mL — ABNORMAL HIGH (ref 0.0–100.0)

## 2019-08-25 LAB — CBC
HCT: 49.9 % (ref 39.0–52.0)
Hemoglobin: 15.6 g/dL (ref 13.0–17.0)
MCH: 28.3 pg (ref 26.0–34.0)
MCHC: 31.3 g/dL (ref 30.0–36.0)
MCV: 90.4 fL (ref 80.0–100.0)
Platelets: 246 10*3/uL (ref 150–400)
RBC: 5.52 MIL/uL (ref 4.22–5.81)
RDW: 16.1 % — ABNORMAL HIGH (ref 11.5–15.5)
WBC: 7 10*3/uL (ref 4.0–10.5)
nRBC: 0 % (ref 0.0–0.2)

## 2019-08-25 LAB — POCT URINALYSIS DIP (DEVICE)
Bilirubin Urine: NEGATIVE
Glucose, UA: NEGATIVE mg/dL
Hgb urine dipstick: NEGATIVE
Ketones, ur: NEGATIVE mg/dL
Leukocytes,Ua: NEGATIVE
Nitrite: NEGATIVE
Protein, ur: NEGATIVE mg/dL
Specific Gravity, Urine: 1.01 (ref 1.005–1.030)
Urobilinogen, UA: 0.2 mg/dL (ref 0.0–1.0)
pH: 6 (ref 5.0–8.0)

## 2019-08-25 LAB — TROPONIN I (HIGH SENSITIVITY)
Troponin I (High Sensitivity): 73 ng/L — ABNORMAL HIGH (ref <18)
Troponin I (High Sensitivity): 57 ng/L — ABNORMAL HIGH (ref <18)

## 2019-08-25 MED ORDER — IOHEXOL 350 MG/ML SOLN
80.0000 mL | Freq: Once | INTRAVENOUS | Status: AC | PRN
  Administered 2019-08-25: 80 mL via INTRAVENOUS

## 2019-08-25 MED ORDER — POTASSIUM CHLORIDE CRYS ER 20 MEQ PO TBCR
40.0000 meq | EXTENDED_RELEASE_TABLET | Freq: Once | ORAL | Status: AC
  Administered 2019-08-25: 40 meq via ORAL
  Filled 2019-08-25: qty 2

## 2019-08-25 MED ORDER — FUROSEMIDE 10 MG/ML IJ SOLN
40.0000 mg | Freq: Once | INTRAMUSCULAR | Status: AC
  Administered 2019-08-25: 40 mg via INTRAVENOUS
  Filled 2019-08-25: qty 4

## 2019-08-25 NOTE — ED Provider Notes (Signed)
[facility]    CSN: [id number] Arrival date & time: 08/25/19  1353      History   Chief Complaint Chief Complaint  Patient presents with  . Shortness of Breath  . Fall    HPI [name] [name] is a 64 y.o. male.   Reports that he is having shortness of breath for about the last 2 weeks.  Reports that he has fallen "few" times, cannot recall the exact number.  Also denies that he has tripped or slipped when he fell, and he has been experiencing dizziness with his shortness of breath.  Reports that he feels like he is retaining fluid, he has had a change in his diuretics.  Reports that he used to take Lasix, and then it was changed to torsemide twice daily.  Reports that he has only been taking the torsemide once a day, because it keeps him up at night if he takes it the second time during the day.  Patient is also stopped taking his potassium, but is still taking diuretics.  Patient reports that he is waiting for losartan from his pharmacy.  Reports that he was afraid to take the torsemide at night, says that he has been falling, and he does not want to fall in the floor trying to go to the bathroom.  He has extensive medical history, including hypertension, diabetes, chronic combined systolic and diastolic heart failure, NSTEMI, stents placed, coronary artery disease, kidney issues.  Reports that he was referred to nephrology, but that he did not follow-up.  Reports that the thought of dialysis "scares him."  Also reports he has sleep apnea but does not wear his CPAP at night.  Denies fever, nausea, vomiting, diarrhea, rash, other symptoms.  ROS per HPI  The history is provided by the patient.    Past Medical History:  Diagnosis Date  . CAD (coronary artery disease)    a. s/p PCI/BMS pRCA 2002; b. PCI pRCA & DES p/m RCA 2006; c. PCI/DES OM4 2007; d. PCI/CBA to RCA for ISR 10/2006; e.08/2012 STEMI/Cath/PCI:  LAD 95% >> PCI: 3.0x20 Promus Prem DES  //  f. NSTEMI 10/15 >> LHC: mLAD  stent ok, dLAD 70, OM1 CTO, OM2 stent ok, dOM2 90, OM3 30-40, dLCx 90, p-mRCA stent ok, mRCA stent ok w/ 60-70 ISR, EF 50% >> med Rx  . Chronic combined systolic and diastolic CHF (congestive heart failure) ([place])    a. Myoview 1/17: EF 35%  //  b. Echo 1/17 EF 45-50%  . Depression   . Diabetes mellitus ([place])    a. A1c 8.8 08/2012->Metformin initiated. => b. A1c (9/14): 6.6  . GERD (gastroesophageal reflux disease)   . History of nuclear stress test    a. Myoview 1/17: EF 35%, fixed inferior lateral defect consistent with infarct, no ischemia, intermediate risk  . History of pneumonia   . HTN (hypertension)   . Hyperlipidemia   . Ischemic cardiomyopathy    a. EF 40%; improved to normal;  b. 08/2012 Echo: EF 50-55%, mod LVH.//  c. Echo 9/16: Inf HK, mild LVH, EF 55%, mild LAE, normal RVF, mild RAE, PASP 35 mmHg  //  d. Echo 1/17: EF 45-50%, inferior HK, mild BAE, PASP 33 mmHg  . NSTEMI (non-ST elevated myocardial infarction) ([place])   . Obesity   . OSA (obstructive sleep apnea)    Does not use CPAP as of 05/2011  . Tobacco abuse     Patient Active Problem List   Diagnosis Date Noted  .  Pre-ulcerative calluses 02/03/2019  . Foot injury, left, initial encounter 11/26/2018  . Chest pain 05/30/2017  . Hypokalemia 05/30/2017  . Allergic rhinitis 12/18/2016  . Lumbar radiculitis 09/05/2016  . Lumbar spinal stenosis 09/05/2016  . CKD (chronic kidney disease), stage III ([place]) 12/29/2015  . Chest pain syndrome 12/15/2015  . Acute kidney injury ([place]) 12/15/2015  . Atypical chest pain 12/15/2015  . Preventative health care 06/27/2015  . Chronic low back pain 06/27/2015  . Left lumbar radiculopathy 04/13/2015  . Bilateral plantar fasciitis 04/13/2015  . Essential hypertension 03/09/2015  . OSA (obstructive sleep apnea) 03/27/2014  . Peripheral neuropathy ([place]) 12/29/2013  . External hemorrhoid, thrombosed 12/03/2013  . Sinus bradycardia 11/15/2013  . NSTEMI (non-ST elevated myocardial  infarction) ([place]) 11/14/2013  . Chronic combined systolic and diastolic CHF  [id number]  . CAD S/P multiple PCI   . Hyperlipidemia   . DM2 (diabetes mellitus, type 2) ([place]) 06/26/2013  . Gait disorder 06/26/2013  . Obesity, Class III, BMI 40-49.9 (morbid obesity) ([place]) 09/10/2012  . Tobacco abuse 09/02/2012  . Ischemic cardiomyopathy- EF 50-55% June 2015   . ST elevation myocardial infarction (STEMI) of anterior wall, initial episode of care ([place]) 09/01/2012  . HEMORRHOIDS 12/26/2009  . Acute prostatitis 12/26/2009  . GANGLION CYST, WRIST, RIGHT 11/28/2009  . COLONIC POLYPS 10/07/2009  . Anxiety with depression 07/21/2009  . DYSPNEA 03/24/2009  . GASTROESOPHAGEAL REFLUX DISEASE 01/27/2007  . PERCUTANEOUS TRANSLUMINAL CORONARY ANGIOPLASTY, HX OF 02/09/2006    Past Surgical History:  Procedure Laterality Date  . CORONARY ANGIOPLASTY WITH STENT PLACEMENT    . CORONARY STENT PLACEMENT  2009  . LEFT HEART CATHETERIZATION WITH CORONARY ANGIOGRAM N/A 08/31/2012   Procedure: LEFT HEART CATHETERIZATION WITH CORONARY ANGIOGRAM;  Surgeon: Burnell Blanks, MD;  Location[facility] CATH LAB;  Service: Cardiovascular;  Laterality: N/A;  . LEFT HEART CATHETERIZATION WITH CORONARY ANGIOGRAM N/A 11/16/2013   Procedure: LEFT HEART CATHETERIZATION WITH CORONARY ANGIOGRAM;  Surgeon: Blane Ohara, MD;  Location: [facility] CATH LAB;  Service: Cardiovascular;  Laterality: N/A;  . LEFT HEART CATHETERIZATION WITH CORONARY ANGIOGRAM N/A 03/15/2014   Procedure: LEFT HEART CATHETERIZATION WITH CORONARY ANGIOGRAM;  Surgeon: Peter M Martinique, MD;  Location: [facility] CATH LAB;  Service: Cardiovascular;  Laterality: N/A;  . PERCUTANEOUS CORONARY STENT INTERVENTION (PCI-S) Right 08/31/2012   Procedure: PERCUTANEOUS CORONARY STENT INTERVENTION (PCI-S);  Surgeon: Burnell Blanks, MD;  Location: [facility] CATH LAB;  Service: Cardiovascular;  Laterality: Right;  . PERCUTANEOUS CORONARY STENT INTERVENTION (PCI-S)  11/16/2013   Procedure:  PERCUTANEOUS CORONARY STENT INTERVENTION (PCI-S);  Surgeon: Blane Ohara, MD;  Location: [facility] CATH LAB;  Service: Cardiovascular;;       Home Medications    Prior to Admission medications   Medication Sig Start Date End Date Taking? Authorizing Provider  albuterol (VENTOLIN HFA) 108 (90 Base) MCG/ACT inhaler Inhale 2 puffs into the lungs every 6 (six) hours as needed for wheezing or shortness of breath. 06/22/19  Yes Biagio Borg, MD  aspirin 81 MG tablet Take 81 mg by mouth daily.   Yes [provider]  buPROPion (WELLBUTRIN XL) 300 MG 24 hr tablet Take 1 tablet (300 mg total) by mouth daily. 12/23/18  Yes Biagio Borg, MD  carvedilol (COREG) 12.5 MG tablet Take 1 tablet (12.5 mg total) by mouth 2 (two) times daily with a meal. 05/05/18  Yes Biagio Borg, MD  cloNIDine (CATAPRES) 0.3 MG tablet Take 1 tablet (0.3 mg total) by mouth 2 (two) times daily. 05/05/18  Yes Biagio Borg, MD  clopidogrel (PLAVIX) 75 MG tablet TAKE 1 TABLET(75 MG) BY MOUTH DAILY 12/03/18  Yes Biagio Borg, MD  Fluticasone-Salmeterol [facility] INHUB) 250-50 MCG/DOSE AEPB Inhale 1 puff into the lungs 2 (two) times daily. 06/22/19  Yes Biagio Borg, MD  furosemide (LASIX) 40 MG tablet TAKE 2 TABLETS BY MOUTH EVERY MORNING AND 1 TABLET BY MOUTH EVERY EVENING. TAKE AN EXTRA TABLET AS NEEDED FOR WEIGHT GAIN OF 3LBS OR MORE 03/13/19  Yes Biagio Borg, MD  gabapentin (NEURONTIN) 300 MG capsule Take 1 capsule (300 mg total) by mouth 3 (three) times daily. 03/13/19  Yes Biagio Borg, MD  glipiZIDE (GLUCOTROL XL) 2.5 MG 24 hr tablet TAKE 1 TABLET(2.5 MG) BY MOUTH DAILY WITH BREAKFAST 06/26/19  Yes Biagio Borg, MD  isosorbide mononitrate (IMDUR) 30 MG 24 hr tablet TAKE 3 TABLETS(90 MG) BY MOUTH DAILY 07/13/19  Yes Biagio Borg, MD  KLOR-CON M10 10 MEQ tablet TK 2 TS PO QAM AND 1 T QPM 03/13/19  Yes Biagio Borg, MD  losartan (COZAAR) 100 MG tablet TAKE 1 TABLET(100 MG) BY MOUTH DAILY 05/05/18  Yes Biagio Borg, MD    pantoprazole (PROTONIX) 40 MG tablet TAKE 1 TABLET(40 MG) BY MOUTH DAILY 03/13/19  Yes Biagio Borg, MD  rosuvastatin (CRESTOR) 40 MG tablet Take 1 tablet (40 mg total) by mouth daily. 03/13/19  Yes Biagio Borg, MD  torsemide (DEMADEX) 20 MG tablet Take by mouth. 05/15/19  Yes [provider]  traMADol (ULTRAM) 50 MG tablet TAKE 1 TABLET(50 MG) BY MOUTH EVERY 6 HOURS AS NEEDED 06/19/19  Yes Biagio Borg, MD  cetirizine (ZYRTEC) 10 MG tablet TAKE 1 TABLET(10 MG) BY MOUTH DAILY 03/13/19   Biagio Borg, MD  diclofenac sodium (VOLTAREN) 1 % GEL Apply 2 g topically 4 (four) times daily. To affected joint. 05/22/18   Lyndal Pulley, DO  lactulose (CHRONULAC) 10 GM/15ML solution TAKE 30 ML BY MOUTH TWICE DAILY AS NEEDED 12/29/15   Biagio Borg, MD  Multiple Vitamins-Minerals (MULTIVITAMIN WITH MINERALS) tablet Take 1 tablet by mouth daily. CENTRUM SILVER MENS  50 PLUS    [provider]  nitroGLYCERIN (NITROSTAT) 0.4 MG SL tablet PLACE 1 TABLET UNDER THE TONGUE EVERY 5 MINUTES AS NEEDED FOR CHEST PAIN 03/13/19   Biagio Borg, MD  PARoxetine (PAXIL) 20 MG tablet Take 1 tablet (20 mg total) by mouth daily. 12/23/18   Biagio Borg, MD  potassium chloride (K-DUR) 10 MEQ tablet TAKE 2 TABLETS BY MOUTH EVERY MORNING AND 1 TABLET BY MOUTH EVERY EVENING 10/30/18   Biagio Borg, MD  predniSONE (DELTASONE) 10 MG tablet 3 tabs by mouth per day for 3 days,2tabs per day for 3 days,1tab per day for 3 days 06/22/19   Biagio Borg, MD  triamcinolone (NASACORT) 55 MCG/ACT AERO nasal inhaler Place 2 sprays into the nose daily. 03/13/19   Biagio Borg, MD  Vitamin D, Ergocalciferol, (DRISDOL) 1.25 MG (50000 UT) CAPS capsule Take 1 capsule (50,000 Units total) by mouth every 7 (seven) days. 12/24/18   Biagio Borg, MD    Family History Family History  Problem Relation Age of Onset  . Depression Mother   . Cancer Mother        ovarian  . Hypertension Mother        Died, 72  . Other Father         motor vehicle accident  .  Coronary artery disease Other   . Heart attack Neg Hx   . Stroke Neg Hx     Social History Social History   Tobacco Use  . Smoking status: Former Smoker    Packs/day: 0.50    Years: 30.00    Pack years: 15.00    Types: Cigarettes  . Smokeless tobacco: Never Used  . Tobacco comment: trying to cut down  Substance Use Topics  . Alcohol use: No  . Drug use: No    Comment: remote marijuana use     Allergies   Penicillin g and Penicillins   Review of Systems Review of Systems   Physical Exam Triage Vital Signs ED Triage Vitals  Enc Vitals Group     BP 08/25/19 1427 (!) 144/97     Pulse Rate 08/25/19 1427 71     Resp 08/25/19 1427 16     Temp 08/25/19 1427 98.1 F (36.7 C)     Temp Source 08/25/19 1427 Oral     SpO2 08/25/19 1427 97 %     Weight 08/25/19 1440 (!) 339 lb (153.8 kg)     Height --      Head Circumference --      Peak Flow --      Pain Score --      Pain Loc --      Pain Edu? --      Excl. in [place]? --    Orthostatic VS for the past 24 hrs:  BP- Lying Pulse- Lying BP- Sitting Pulse- Sitting BP- Standing at 0 minutes Pulse- Standing at 0 minutes  08/25/19 1520 154/74 69 144/70 77 136/72 66    Updated Vital Signs BP (!) 144/97 (BP Location: Left Arm)   Pulse 71   Temp 98.1 F (36.7 C) (Oral)   Resp 16   Wt (!) 339 lb (153.8 kg)   SpO2 97%   BMI 44.73 kg/m   Visual Acuity Right Eye Distance:   Left Eye Distance:   Bilateral Distance:    Right Eye Near:   Left Eye Near:    Bilateral Near:     Physical Exam Vitals and nursing note reviewed.  Constitutional:      General: He is not in acute distress.    Appearance: He is well-developed. He is obese. He is ill-appearing. He is not toxic-appearing or diaphoretic.     Interventions: He is not intubated. HENT:     Head: Normocephalic and atraumatic.     Mouth/Throat:     Mouth: Mucous membranes are moist.     Pharynx: No oropharyngeal exudate.  Eyes:      Extraocular Movements: Extraocular movements intact.     Conjunctiva/sclera: Conjunctivae normal.     Pupils: Pupils are equal, round, and reactive to light.  Neck:     Thyroid: No thyromegaly.     Vascular: No hepatojugular reflux.     Trachea: No tracheal deviation.  Cardiovascular:     Rate and Rhythm: Normal rate. Rhythm irregular.  No extrasystoles are present.    Pulses: Normal pulses. No decreased pulses.     Heart sounds: No murmur. No friction rub. No gallop.   Pulmonary:     Effort: Pulmonary effort is normal. No tachypnea, bradypnea, accessory muscle usage or respiratory distress. He is not intubated.     Breath sounds: No stridor. Examination of the right-upper field reveals decreased breath sounds. Examination of the left-upper field reveals decreased breath sounds. Decreased breath sounds present. No wheezing,  rhonchi or rales.  Chest:     Chest wall: No mass, deformity, tenderness, crepitus or edema. There is no dullness to percussion.  Abdominal:     Palpations: Abdomen is soft.     Tenderness: There is no abdominal tenderness.  Musculoskeletal:        General: Normal range of motion.     Cervical back: Normal range of motion and neck supple.     Right lower leg: No tenderness. No edema.     Left lower leg: No edema.  Lymphadenopathy:     Cervical: No cervical adenopathy.  Skin:    General: Skin is warm and dry.     Capillary Refill: Capillary refill takes less than 2 seconds.  Neurological:     General: No focal deficit present.     Mental Status: He is alert and oriented to person, place, and time.  Psychiatric:        Mood and Affect: Mood normal.        Behavior: Behavior normal.      UC Treatments / Results  Labs (all labs ordered are listed, but only abnormal results are displayed) Labs Reviewed  URINE CULTURE  POCT URINALYSIS DIP (DEVICE)    EKG   Radiology No results found.  Procedures Procedures (including critical care  time)  Medications Ordered in UC Medications - No data to display  Initial Impression / Assessment and Plan / UC Course  I have reviewed the triage vital signs and the nursing notes.  Pertinent labs & imaging results that were available during my care of the patient were reviewed by me and considered in my medical decision making (see chart for details).     Presents today with shortness of breath, dizziness, patient is visibly unbalanced today.  When he stands up to walk to the bathroom he is wobbling side to side.  Fatigue, weakness.  Abnormal EKG in office today, with irregular rate, first-degree heart block, PVC.  Instructed patient to go to the ER for further evaluation and treatment.  Wheeled down to the ER from this office. Final Clinical Impressions(s) / UC Diagnoses   Final diagnoses:  Fall, initial encounter  Shortness of breath  Acute on chronic combined systolic and diastolic heart failure (HCC)  Irregular heart rate  Nonspecific abnormal electrocardiogram (ECG) (EKG)  Irregular heart rhythm     Discharge Instructions     Go to the ER for further evaluation and treatment.    ED Prescriptions    None     PDMP not reviewed this encounter.   Faustino Congress, NP 08/25/19 1729

## 2019-08-25 NOTE — ED Provider Notes (Signed)
[facility] EMERGENCY DEPARTMENT Provider Note   CSN: [id number] Arrival date & time: 08/25/19  1547    History Chief Complaint  Patient presents with  . Dizziness  . Shortness of Breath    [name] [name] is a 64 y.o. male history congestive heart failure, multiple heart attacks, significant coronary artery disease, ischemic cardiomyopathy, hypertension, diabetes, obesity, presenting to the emergency department with shortness of breath for the past 2 weeks.  Reports specifically that he has had pain in his left lower shoulder blade for approximately 3 weeks.  He believes it was gradual onset 1 day.  The pain has been constant since then.  It is sometimes pleuritic.  Does not radiate or go away.  He denies that it feels like his prior heart attacks.  He does report to me that he feels more short of breath than usual.  Even sitting on the bed sometimes he feels short of breath.  He does not feel like he is putting on water and does not feel like it is his heart failure.  He has no history of blood clots.  He is no reported history of aneurysm.  He is not a smoker.  He has not been taking any of his medications for 2 weeks because he is dealing with depression.  HPI     Past Medical History:  Diagnosis Date  . CAD (coronary artery disease)    a. s/p PCI/BMS pRCA 2002; b. PCI pRCA & DES p/m RCA 2006; c. PCI/DES OM4 2007; d. PCI/CBA to RCA for ISR 10/2006; e.08/2012 STEMI/Cath/PCI:  LAD 95% >> PCI: 3.0x20 Promus Prem DES  //  f. NSTEMI 10/15 >> LHC: mLAD stent ok, dLAD 70, OM1 CTO, OM2 stent ok, dOM2 90, OM3 30-40, dLCx 90, p-mRCA stent ok, mRCA stent ok w/ 60-70 ISR, EF 50% >> med Rx  . Chronic combined systolic and diastolic CHF (congestive heart failure) ([place])    a. Myoview 1/17: EF 35%  //  b. Echo 1/17 EF 45-50%  . Depression   . Diabetes mellitus ([place])    a. A1c 8.8 08/2012->Metformin initiated. => b. A1c (9/14): 6.6  . GERD (gastroesophageal reflux disease)   .  History of nuclear stress test    a. Myoview 1/17: EF 35%, fixed inferior lateral defect consistent with infarct, no ischemia, intermediate risk  . History of pneumonia   . HTN (hypertension)   . Hyperlipidemia   . Ischemic cardiomyopathy    a. EF 40%; improved to normal;  b. 08/2012 Echo: EF 50-55%, mod LVH.//  c. Echo 9/16: Inf HK, mild LVH, EF 55%, mild LAE, normal RVF, mild RAE, PASP 35 mmHg  //  d. Echo 1/17: EF 45-50%, inferior HK, mild BAE, PASP 33 mmHg  . NSTEMI (non-ST elevated myocardial infarction) ([place])   . Obesity   . OSA (obstructive sleep apnea)    Does not use CPAP as of 05/2011  . Tobacco abuse     Patient Active Problem List   Diagnosis Date Noted  . Pre-ulcerative calluses 02/03/2019  . Foot injury, left, initial encounter 11/26/2018  . Chest pain 05/30/2017  . Hypokalemia 05/30/2017  . Allergic rhinitis 12/18/2016  . Lumbar radiculitis 09/05/2016  . Lumbar spinal stenosis 09/05/2016  . CKD (chronic kidney disease), stage III ([place]) 12/29/2015  . Chest pain syndrome 12/15/2015  . Acute kidney injury ([place]) 12/15/2015  . Atypical chest pain 12/15/2015  . Preventative health care 06/27/2015  . Chronic low back pain 06/27/2015  .  Left lumbar radiculopathy 04/13/2015  . Bilateral plantar fasciitis 04/13/2015  . Essential hypertension 03/09/2015  . OSA (obstructive sleep apnea) 03/27/2014  . Peripheral neuropathy ([place]) 12/29/2013  . External hemorrhoid, thrombosed 12/03/2013  . Sinus bradycardia 11/15/2013  . NSTEMI (non-ST elevated myocardial infarction) ([place]) 11/14/2013  . Chronic combined systolic and diastolic CHF  [id number]  . CAD S/P multiple PCI   . Hyperlipidemia   . DM2 (diabetes mellitus, type 2) ([place]) 06/26/2013  . Gait disorder 06/26/2013  . Obesity, Class III, BMI 40-49.9 (morbid obesity) ([place]) 09/10/2012  . Tobacco abuse 09/02/2012  . Ischemic cardiomyopathy- EF 50-55% June 2015   . ST elevation myocardial infarction (STEMI) of anterior wall,  initial episode of care ([place]) 09/01/2012  . HEMORRHOIDS 12/26/2009  . Acute prostatitis 12/26/2009  . GANGLION CYST, WRIST, RIGHT 11/28/2009  . COLONIC POLYPS 10/07/2009  . Anxiety with depression 07/21/2009  . DYSPNEA 03/24/2009  . GASTROESOPHAGEAL REFLUX DISEASE 01/27/2007  . PERCUTANEOUS TRANSLUMINAL CORONARY ANGIOPLASTY, HX OF 02/09/2006    Past Surgical History:  Procedure Laterality Date  . CORONARY ANGIOPLASTY WITH STENT PLACEMENT    . CORONARY STENT PLACEMENT  2009  . LEFT HEART CATHETERIZATION WITH CORONARY ANGIOGRAM N/A 08/31/2012   Procedure: LEFT HEART CATHETERIZATION WITH CORONARY ANGIOGRAM;  Surgeon: Burnell Blanks, MD;  Location: [facility] CATH LAB;  Service: Cardiovascular;  Laterality: N/A;  . LEFT HEART CATHETERIZATION WITH CORONARY ANGIOGRAM N/A 11/16/2013   Procedure: LEFT HEART CATHETERIZATION WITH CORONARY ANGIOGRAM;  Surgeon: Blane Ohara, MD;  Location: [facility] CATH LAB;  Service: Cardiovascular;  Laterality: N/A;  . LEFT HEART CATHETERIZATION WITH CORONARY ANGIOGRAM N/A 03/15/2014   Procedure: LEFT HEART CATHETERIZATION WITH CORONARY ANGIOGRAM;  Surgeon: Peter M Martinique, MD;  Location: [facility] CATH LAB;  Service: Cardiovascular;  Laterality: N/A;  . PERCUTANEOUS CORONARY STENT INTERVENTION (PCI-S) Right 08/31/2012   Procedure: PERCUTANEOUS CORONARY STENT INTERVENTION (PCI-S);  Surgeon: Burnell Blanks, MD;  Location: [facility] CATH LAB;  Service: Cardiovascular;  Laterality: Right;  . PERCUTANEOUS CORONARY STENT INTERVENTION (PCI-S)  11/16/2013   Procedure: PERCUTANEOUS CORONARY STENT INTERVENTION (PCI-S);  Surgeon: Blane Ohara, MD;  Location: [facility] CATH LAB;  Service: Cardiovascular;;       Family History  Problem Relation Age of Onset  . Depression Mother   . Cancer Mother        ovarian  . Hypertension Mother        Died, 2  . Other Father        motor vehicle accident  . Coronary artery disease Other   . Heart attack Neg Hx   . Stroke Neg Hx     Social  History   Tobacco Use  . Smoking status: Former Smoker    Packs/day: 0.50    Years: 30.00    Pack years: 15.00    Types: Cigarettes  . Smokeless tobacco: Never Used  . Tobacco comment: trying to cut down  Substance Use Topics  . Alcohol use: No  . Drug use: No    Comment: remote marijuana use    Home Medications Prior to Admission medications   Medication Sig Start Date End Date Taking? Authorizing Provider  albuterol (VENTOLIN HFA) 108 (90 Base) MCG/ACT inhaler Inhale 2 puffs into the lungs every 6 (six) hours as needed for wheezing or shortness of breath. 06/22/19  Yes Biagio Borg, MD  aspirin 81 MG tablet Take 81 mg by mouth daily.   Yes [provider]  carvedilol (COREG) 12.5 MG tablet Take 1 tablet (  12.5 mg total) by mouth 2 (two) times daily with a meal. 05/05/18  Yes Biagio Borg, MD  clopidogrel (PLAVIX) 75 MG tablet TAKE 1 TABLET(75 MG) BY MOUTH DAILY Patient taking differently: Take 75 mg by mouth daily.  12/03/18  Yes Biagio Borg, MD  Fluticasone-Salmeterol [facility] INHUB) 250-50 MCG/DOSE AEPB Inhale 1 puff into the lungs 2 (two) times daily. 06/22/19  Yes Biagio Borg, MD  gabapentin (NEURONTIN) 300 MG capsule Take 1 capsule (300 mg total) by mouth 3 (three) times daily. 03/13/19  Yes Biagio Borg, MD  glipiZIDE (GLUCOTROL XL) 2.5 MG 24 hr tablet TAKE 1 TABLET(2.5 MG) BY MOUTH DAILY WITH BREAKFAST Patient taking differently: Take 2.5 mg by mouth daily.  06/26/19  Yes Biagio Borg, MD  isosorbide mononitrate (IMDUR) 30 MG 24 hr tablet TAKE 3 TABLETS(90 MG) BY MOUTH DAILY Patient taking differently: Take 90 mg by mouth in the morning, at noon, and at bedtime.  07/13/19  Yes Biagio Borg, MD  nitroGLYCERIN (NITROSTAT) 0.4 MG SL tablet PLACE 1 TABLET UNDER THE TONGUE EVERY 5 MINUTES AS NEEDED FOR CHEST PAIN Patient taking differently: Place 0.4 mg under the tongue every 5 (five) minutes as needed for chest pain.  03/13/19  Yes Biagio Borg, MD  pantoprazole  (PROTONIX) 40 MG tablet TAKE 1 TABLET(40 MG) BY MOUTH DAILY Patient taking differently: Take 40 mg by mouth daily.  03/13/19  Yes Biagio Borg, MD  rosuvastatin (CRESTOR) 40 MG tablet Take 1 tablet (40 mg total) by mouth daily. 03/13/19  Yes Biagio Borg, MD  torsemide (DEMADEX) 20 MG tablet Take 20 mg by mouth 2 (two) times daily.  05/15/19  Yes [provider]  buPROPion (WELLBUTRIN XL) 300 MG 24 hr tablet Take 1 tablet (300 mg total) by mouth daily. Patient not taking: Reported on 08/25/2019 12/23/18   Biagio Borg, MD  cetirizine (ZYRTEC) 10 MG tablet TAKE 1 TABLET(10 MG) BY MOUTH DAILY Patient not taking: Reported on 08/25/2019 03/13/19   Biagio Borg, MD  cloNIDine (CATAPRES) 0.3 MG tablet Take 1 tablet (0.3 mg total) by mouth 2 (two) times daily. Patient not taking: Reported on 08/25/2019 05/05/18   Biagio Borg, MD  diclofenac sodium (VOLTAREN) 1 % GEL Apply 2 g topically 4 (four) times daily. To affected joint. Patient not taking: Reported on 08/25/2019 05/22/18   Lyndal Pulley, DO  furosemide (LASIX) 40 MG tablet TAKE 2 TABLETS BY MOUTH EVERY MORNING AND 1 TABLET BY MOUTH EVERY EVENING. TAKE AN EXTRA TABLET AS NEEDED FOR WEIGHT GAIN OF 3LBS OR MORE Patient not taking: Reported on 08/25/2019 03/13/19   Biagio Borg, MD  lactulose [facility]) 10 GM/15ML solution TAKE 30 ML BY MOUTH TWICE DAILY AS NEEDED Patient not taking: Reported on 08/25/2019 12/29/15   Biagio Borg, MD  losartan (COZAAR) 100 MG tablet TAKE 1 TABLET(100 MG) BY MOUTH DAILY Patient not taking: Reported on 08/25/2019 05/05/18   Biagio Borg, MD  PARoxetine (PAXIL) 20 MG tablet Take 1 tablet (20 mg total) by mouth daily. Patient not taking: Reported on 08/25/2019 12/23/18   Biagio Borg, MD  potassium chloride (K-DUR) 10 MEQ tablet TAKE 2 TABLETS BY MOUTH EVERY MORNING AND 1 TABLET BY MOUTH EVERY EVENING Patient not taking: Reported on 08/25/2019 10/30/18   Biagio Borg, MD  potassium chloride (KLOR-CON) 10 MEQ  tablet TAKE 2 TABLETS BY MOUTH EVERY MORNING AND 1 TABLET EVERY EVENING 08/26/19   [name],  Hunt Oris, MD  predniSONE (DELTASONE) 10 MG tablet 3 tabs by mouth per day for 3 days,2tabs per day for 3 days,1tab per day for 3 days Patient not taking: Reported on 08/25/2019 06/22/19   Biagio Borg, MD  traMADol (ULTRAM) 50 MG tablet TAKE 1 TABLET(50 MG) BY MOUTH EVERY 6 HOURS AS NEEDED Patient not taking: Reported on 08/25/2019 06/19/19   Biagio Borg, MD  triamcinolone (NASACORT) 55 MCG/ACT AERO nasal inhaler Place 2 sprays into the nose daily. Patient not taking: Reported on 08/25/2019 03/13/19   Biagio Borg, MD  Vitamin D, Ergocalciferol, (DRISDOL) 1.25 MG (50000 UT) CAPS capsule Take 1 capsule (50,000 Units total) by mouth every 7 (seven) days. Patient not taking: Reported on 08/25/2019 12/24/18   Biagio Borg, MD    Allergies    Penicillin g and Penicillins  Review of Systems   Review of Systems  Constitutional: Negative for chills and fever.  Respiratory: Positive for shortness of breath. Negative for cough.   Cardiovascular: Positive for palpitations and leg swelling. Negative for chest pain.  Gastrointestinal: Negative for abdominal pain, nausea and vomiting.  Musculoskeletal: Positive for back pain and myalgias.  Skin: Negative for color change and rash.  Neurological: Negative for syncope and headaches.  All other systems reviewed and are negative.   Physical Exam Updated Vital Signs BP 121/90 (BP Location: Right Arm)   Pulse 64   Temp 98 F (36.7 C) (Oral)   Resp 18   SpO2 95%   Physical Exam Vitals and nursing note reviewed.  Constitutional:      Appearance: He is obese.  HENT:     Head: Normocephalic and atraumatic.  Eyes:     Conjunctiva/sclera: Conjunctivae normal.  Cardiovascular:     Rate and Rhythm: Normal rate and regular rhythm.     Pulses: Normal pulses.     Heart sounds: No murmur.  Pulmonary:     Effort: Pulmonary effort is normal.     Breath sounds: Normal  breath sounds.     Comments: 98% O2 on room air, speaking comfortably in full sentences Fine crackles in lung bases Abdominal:     General: There is no distension or abdominal bruit.     Palpations: Abdomen is soft. There is no pulsatile mass.     Tenderness: There is no abdominal tenderness.  Musculoskeletal:     Cervical back: Neck supple.     Right lower leg: Edema present.     Left lower leg: Edema present.     Comments: Mild bilateral symmetrical pitting edema of the lower legs Left back pain not reproducible on exam  Skin:    General: Skin is warm and dry.  Neurological:     Mental Status: He is alert.  Psychiatric:        Mood and Affect: Mood normal.        Behavior: Behavior normal.     ED Results / Procedures / Treatments   Labs (all labs ordered are listed, but only abnormal results are displayed) Labs Reviewed  BASIC METABOLIC PANEL - Abnormal; Notable for the following components:      Result Value   Potassium 3.2 (*)    Glucose, Bld 124 (*)    Creatinine, Ser 1.43 (*)    GFR calc non Af Amer 52 (*)    All other components within normal limits  CBC - Abnormal; Notable for the following components:   RDW 16.1 (*)    All other components  within normal limits  BRAIN NATRIURETIC PEPTIDE - Abnormal; Notable for the following components:   B Natriuretic Peptide 119.8 (*)    All other components within normal limits  TROPONIN I (HIGH SENSITIVITY) - Abnormal; Notable for the following components:   Troponin I (High Sensitivity) 73 (*)    All other components within normal limits  TROPONIN I (HIGH SENSITIVITY) - Abnormal; Notable for the following components:   Troponin I (High Sensitivity) 57 (*)    All other components within normal limits  SARS CORONAVIRUS 2 (TAT 6-24 HRS)    EKG EKG Interpretation  Date/Time:  Tuesday August 25 2019 22:56:16 EDT Ventricular Rate:  68 PR Interval:    QRS Duration: 167 QT Interval:  487 QTC Calculation: 518 R  Axis:   -127 Text Interpretation: Sinus rhythm Atrial premature complexes Probable left atrial enlargement IVCD, consider atypical RBBB Inferior infarct, old Abnormal lateral Q waves No sig change from prior ecg Confirmed by Octaviano Glow [phone]) on 08/25/2019 11:07:33 PM Also confirmed by Octaviano Glow [phone]), editor Gean Quint [phone])  on 08/26/2019 8:36:53 AM   Radiology CT Angio Chest PE W and/or Wo Contrast  Result Date: 08/25/2019 CLINICAL DATA:  Shortness of breath, dry cough and dizziness with weakness for 2 weeks EXAM: CT ANGIOGRAPHY CHEST WITH CONTRAST TECHNIQUE: Multidetector CT imaging of the chest was performed using the standard protocol during bolus administration of intravenous contrast. Multiplanar CT image reconstructions and MIPs were obtained to evaluate the vascular anatomy. CONTRAST:  37mL OMNIPAQUE IOHEXOL 350 MG/ML SOLN COMPARISON:  CTA 05/29/2017 FINDINGS: Cardiovascular: Satisfactory opacification the pulmonary arteries to the segmental level. No pulmonary artery filling defects are identified. Central pulmonary arteries are normal caliber. Cardiomegaly with four-chamber enlargement is noted. Three-vessel coronary artery disease is noted with a stent in the LAD. Gas is noted in the right ventricle as well as in the brachiocephalic vein and superficial veins of the right upper extremity as well, likely iatrogenic and related to intravenous access. Atherosclerotic plaque within the normal caliber aorta. Normal 3 vessel branching of the aortic arch. Slight tortuosity of the right brachiocephalic vessels is noted. Mediastinum/Nodes: No mediastinal fluid or gas. Normal thyroid gland and thoracic inlet. No acute abnormality of the trachea. Small hiatal hernia. Thoracic esophagus is otherwise unremarkable. No worrisome mediastinal, hilar or axillary adenopathy. Lungs/Pleura: There are bandlike areas of subsegmental atelectasis and/or scarring most notably in the lingula and lung  bases. More mosaic attenuation likely reflects additional hypoventilatory change and/or air trapping in the setting of imaging during exhalation as demonstrated by the posterior bowing of the trachea, expected within angiographic technique. No consolidation, features of edema, pneumothorax, or effusion. No suspicious pulmonary nodules or masses. Upper Abdomen: No acute abnormalities present in the visualized portions of the upper abdomen. Musculoskeletal: Multilevel degenerative changes are present in the imaged portions of the spine. No acute osseous abnormality or suspicious osseous lesion. Review of the MIP images confirms the above findings. IMPRESSION: 1. No evidence of acute pulmonary artery filling defects to suggest pulmonary embolism. 2. No acute abnormality identified in the chest. 3. Cardiomegaly with four-chamber enlargement. Three-vessel coronary artery disease with a stent in the LAD. 4. Multiple foci of venous gas and air within the right ventricle, most commonly iatrogenic and related to intravenous access. 5. Aortic Atherosclerosis (ICD10-I70.0). Electronically Signed   By: Lovena Le M.D.   On: 08/25/2019 22:22    Procedures Procedures (including critical care time)  Medications Ordered in ED Medications  iohexol (OMNIPAQUE) 350 MG/ML  injection 80 mL (80 mLs Intravenous Contrast Given 08/25/19 2214)  furosemide (LASIX) injection 40 mg (40 mg Intravenous Given 08/25/19 2340)  potassium chloride SA (KLOR-CON) CR tablet 40 mEq (40 mEq Oral Given 08/25/19 2338)    ED Course  I have reviewed the triage vital signs and the nursing notes.  Pertinent labs & imaging results that were available during my care of the patient were reviewed by me and considered in my medical decision making (see chart for details).  64 year old male with a history of significant coronary artery disease with multiple MIs, stents, CHF on torsemide 20 mg BID - who hasn't been taking any of his medications for the  past 2 weeks due to bout of depression - presenting to the ED with worsening SOB, chest pain, and back pain.   He is not hypoxic, but he does have crackles in his lung bases and some mild pitting edema which may be consistent with mild CHF in the setting of his medical noncompliance.   Chest pain is very intermittent and he is not actively having in the ED.  His initial troponin was somewhat elevated but the second troponin was downtrending, with an ecg less suggestive of ACS at this time. Likely some demand ischemia from CHF. (Update: after initial patient assessment, I learned about a cardiac catheterization report from 3 months ago, on 05/18/2019 from [facility], which noted diffuse mild nonobstructive LAD and CIRC disease, with chronic 100% stenosis of the mid RCA.  Recommendations were for "medical therapy for CAD, repeat PCI would be associated with high restenosis risk and little LV function improvement since the IP wall is akinetic," per Dr. Alinda Money, MD.)  Given this finding, I re-iterated to the patient the importance of medical compliance, and strongly emphasized that he follows up with his own cardiologist.   I'll reach out to the [facility] group to help expedite his follow up.  Because of his shortness of breath and his sharp pleuritic pain located in the back, I felt it was reasonable to obtain a PE study.  This did not show any pulmonary embolism, but again noted severe triple-vessel coronary artery disease.  He has no signs or symptoms of pneumonia.  I do not believe this is COVID-19.    I have lower suspicion for aortic dissection given that the symptoms been ongoing for 3 weeks, and associated with shortness of breath.  There is no mention of aneurysm on his CT imaging.  There is no dissection flap or aneurysm was noted on CT imaging.  He does have a hx of CHF, but there is no evidence of significant pulmonary edema on CT scan.  BNP is only mildly elevated.  He is stable on room air here.   The patient has not been taking his torsemide at home for the past 2 weeks, so I advised him to resume his home diuresis.  His cardiologist can arrange for outpatient echocardiogram if they feel this is warranted.   Clinical Course as of Aug 26 1242  Tue Aug 25, 2019  2232 IMPRESSION: 1. No evidence of acute pulmonary artery filling defects to suggest pulmonary embolism. 2. No acute abnormality identified in the chest. 3. Cardiomegaly with four-chamber enlargement. Three-vessel coronary artery disease with a stent in the LAD. 4. Multiple foci of venous gas and air within the right ventricle, most commonly iatrogenic and related to intravenous access. 5. Aortic Atherosclerosis (ICD10-I70.0).     [MT]    Clinical Course User Index [MT]  Wyvonnia Dusky, MD    Final Clinical Impression(s) / ED Diagnoses Final diagnoses:  Chest pain, unspecified type  Depression, unspecified depression type    Rx / DC Orders ED Discharge Orders    None       Wyvonnia Dusky, MD 08/26/19 1244

## 2019-08-25 NOTE — ED Notes (Signed)
Patient is being discharged from the Urgent [place] and sent to the Emergency Department via wheelchair by staff. Per Faustino Congress patient is stable but in need of higher level of care due to weakness, SOB, dizziness. Patient is aware and verbalizes understanding of plan of care.  Vitals:   08/25/19 1427  BP: (!) 144/97  Pulse: 71  Resp: 16  Temp: 98.1 F (36.7 C)  SpO2: 97%

## 2019-08-25 NOTE — ED Notes (Signed)
Patient was visibly dizzy upon sitting up from laying position and upon standing from sitting position.  Patient had to be steadied while obtaining BP when standing.

## 2019-08-25 NOTE — ED Triage Notes (Signed)
Pt c/o SOB approx 2 weeks; also c/o frequent falls. Pt states he felt off-balance on Sunday and then fell while down a decline. Denies injury to head or LOC. Denies other neurological complaints I.e. no slurred speech, parasthesias, or change in sensation/vision. Denies CP.  States he feels he is retaining fluid. Does not monitor weight routinely. States hasn't taken wellbutrin for several weeks b/c he couldn't find it.  Has only been taking torsemide once in morning b/c night time dose causes him to urinate frequently throughout the night.  Also reports falling out of bed frequently 2/2 mattress.  Has been at least a week since last potassium dose. [place] is on order per pt.

## 2019-08-25 NOTE — ED Notes (Signed)
Pt transported to CT ?

## 2019-08-25 NOTE — Discharge Instructions (Addendum)
Go to the ER for further evaluation and treatment. 

## 2019-08-25 NOTE — ED Triage Notes (Signed)
Pt arrives to ED from [facility] with complaints of SOB and dizziness x2 weeks. Patient states he has had multiple falls lately and has been real weak. Patient states that he feels like he is retaining fluid. Patient denies any injury with recent falls.

## 2019-08-26 ENCOUNTER — Other Ambulatory Visit: Payer: Self-pay | Admitting: Internal Medicine

## 2019-08-26 LAB — SARS CORONAVIRUS 2 (TAT 6-24 HRS): SARS Coronavirus 2: NEGATIVE

## 2019-08-26 NOTE — Discharge Instructions (Addendum)
Your workup in the ER today did not show signs of a blood clot in your lungs, infection in your lungs, or active heart attack.  However, you are still at risk for heart disease.   It's extremely important that you follow up with your cardiologist in the office this week.  It's also very important that you take all of your home medications as prescribed.  I also included a resource guide with a list of psychiatry clinics to follow up on your depression issues.  *  Your caregiver has diagnosed you as having chest pain that is not specific for one problem, but does not require admission.  You are at low risk for an acute heart condition or other serious illness. Chest pain comes from many different causes.   It is very important that you speak to your provider in 1-2 days for this visit.  Your doctor may want you to be seen in the office for a follow up visit.  SEEK IMMEDIATE MEDICAL ATTENTION IF: You have severe chest pain, especially if the pain is crushing or pressure-like and spreads to the arms, back, neck, or jaw, or if you have sweating, nausea (feeling sick to your stomach), or shortness of breath. THIS IS AN EMERGENCY. Don't wait to see if the pain will go away. Get medical help at once. Call 911 or 0 (operator). DO NOT drive yourself to the hospital.   Your chest pain gets worse and does not go away with rest.  You have an attack of chest pain lasting longer than usual, despite rest and treatment with the medications your caregiver has prescribed.  You wake from sleep with chest pain or shortness of breath.  You feel dizzy or faint.  You have chest pain not typical of your usual pain for which you originally saw your caregiver.

## 2019-08-26 NOTE — ED Notes (Signed)
Discharge instructions reviewed with pt. Pt verbalized understanding.   

## 2019-09-07 ENCOUNTER — Ambulatory Visit: Payer: Medicare HMO | Admitting: Physician Assistant

## 2019-09-07 NOTE — Progress Notes (Deleted)
Cardiology Office Note:    Date:  09/07/2019   ID:  [name] [name], DOB 05-17-1956, MRN [id number]  PCP:  Biagio Borg, MD  Cardiologist:  Sherren Mocha, MD *** Electrophysiologist:  None   Referring MD: Biagio Borg, MD   Chief Complaint:  No chief complaint on file.    Patient Profile:    [name] [name] is a 64 y.o. male with:   Coronary artery disease   S/p PCI to RCA and PL1  S/p POBA to RCA in 10/2006 2/2 ISR  S/p Ant STEMI in 3/14 >> PCI: DES to LAD  Diffuse residual dz >> med Rx  S/p NSTEMI in 6/15 >> PCI:  DES to OM1  S/p NSTEMI in 10/15 >> severe small vessel dz (LCx, OM2) + mod dz in RCA, LAD >> Med Rx  Myoview 06/2015: inf infarct, no ischemia, EF 35, intermediate risk  Ischemic CM EF 40 >> improved to Normal  EF 35-40 at time of Ant STEMI in 3/14 >> improved to 50-55  Echocardiogram 06/2015: EF 45-50, inf HK, PASP 33  Chronic combined systolic and diastolic CHF  Hypertension   Hyperlipidemia   OSA  Prior CV studies:  Cardiac catheterization 05/18/2019 [facility]) LAD irregularities LCx irregularities RCA diffuse in-stent restenosis followed by 100 CTO  Echocardiogram 05/13/2019 [facility]) Moderate concentric LVH, inferior AK, posterior HK, EF 50-55  Echocardiogram 05/31/17 Severe conc LVH, EF 55-60, no RWMA, Gr 1 DD, PASP 47  Echo 07/07/15 EF 45-50%, inferior HK, mild BAE, PASP 33 mmHg  Myoview 06/16/15 Intermediate risk stress nuclear study with a large, severe, predominantly fixed inferior lateral defect consistent with prior infarct; no significant ischemia; EF 35 but visually appears better; akinesis of the basal and mid inferior lateral wall; mild LVE; suggest echo to better assess LV function. Study intermediate risk due to reduced LV function.  Echo 9/21/6 Inf HK, mild LVH, EF 55%, mild LAE, normal RVF, mild RAE, PASP 35 mmHg  LHC (10/15):  Proximal LAD ectatic, mid LAD stent patent, distal LAD 70% at the apex, proximal and mid  circumflex diffuse ectasia, small OM1 with CTO, OM2 stent patent, distal OM2 90%, OM3 30-40%, very distal circumflex 90%, proximal to mid RCA stent patent, mid RCA stent 60-70% ISR, EF 50%, inferobasal akinesis  Echo (6/15):  Mild LVH. EF 50% to 55%. Wall motion was normal. Grade 1 diastolic dysfunction. Left atrium: The atrium was mildly dilated. Right ventricle: The cavity size was moderately dilated. Right atrium: The atrium was mildly to moderately dilated.  Myoview 9/12:  mod scar in base/mid inf and IL segments, slight peri-infarct ischemia - low risk (med Rx continued).  Carotid US (4/13):  Bilateral: no ICA stenosis  History of Present Illness:    [name] [name] was last seen in clinic in January 2019 by Leanor Kail, PA-C.  Since then, he was admitted to [facility] in December 2020 with decompensated heart failure.  He underwent cardiac catheterization during that admission which demonstrated a chronically occluded RCA.  He had no significant disease in the LAD and LCx.  His echocardiogram demonstrated normal LV function.  Most recently, he was seen in the emergency room at [facility] 08/25/2019 for chest pain.  His high-sensitivity troponin was elevated without clear trend/significant delta (73 >> 57).  CT was negative for pulmonary embolism.  He had been off of his torsemide and he was asked to resume this.  The DICTATELATER SmartLink is not supported in this context. ***  Past Medical History:  Diagnosis Date  . CAD (coronary artery disease)    a. s/p PCI/BMS pRCA 2002; b. PCI pRCA & DES p/m RCA 2006; c. PCI/DES OM4 2007; d. PCI/CBA to RCA for ISR 10/2006; e.08/2012 STEMI/Cath/PCI:  LAD 95% >> PCI: 3.0x20 Promus Prem DES  //  f. NSTEMI 10/15 >> LHC: mLAD stent ok, dLAD 70, OM1 CTO, OM2 stent ok, dOM2 90, OM3 30-40, dLCx 90, p-mRCA stent ok, mRCA stent ok w/ 60-70 ISR, EF 50% >> med Rx  . Chronic combined systolic and diastolic CHF (congestive heart  failure) ([place])    a. Myoview 1/17: EF 35%  //  b. Echo 1/17 EF 45-50%  . Depression   . Diabetes mellitus ([place])    a. A1c 8.8 08/2012->Metformin initiated. => b. A1c (9/14): 6.6  . GERD (gastroesophageal reflux disease)   . History of nuclear stress test    a. Myoview 1/17: EF 35%, fixed inferior lateral defect consistent with infarct, no ischemia, intermediate risk  . History of pneumonia   . HTN (hypertension)   . Hyperlipidemia   . Ischemic cardiomyopathy    a. EF 40%; improved to normal;  b. 08/2012 Echo: EF 50-55%, mod LVH.//  c. Echo 9/16: Inf HK, mild LVH, EF 55%, mild LAE, normal RVF, mild RAE, PASP 35 mmHg  //  d. Echo 1/17: EF 45-50%, inferior HK, mild BAE, PASP 33 mmHg  . NSTEMI (non-ST elevated myocardial infarction) ([place])   . Obesity   . OSA (obstructive sleep apnea)    Does not use CPAP as of 05/2011  . Tobacco abuse     Current Medications: No outpatient medications have been marked as taking for the 09/07/19 encounter (Appointment) with [name] T, PA-C.     Allergies:   Penicillin g and Penicillins   Social History   Tobacco Use  . Smoking status: Former Smoker    Packs/day: 0.50    Years: 30.00    Pack years: 15.00    Types: Cigarettes  . Smokeless tobacco: Never Used  . Tobacco comment: trying to cut down  Substance Use Topics  . Alcohol use: No  . Drug use: No    Comment: remote marijuana use     Family Hx: The patient's family history includes Cancer in his mother; Coronary artery disease in an other family member; Depression in his mother; Hypertension in his mother; Other in his father. There is no history of Heart attack or Stroke.  ROS   EKGs/Labs/Other Test Reviewed:    EKG:  EKG is *** ordered today.  The ekg ordered today demonstrates ***  Recent Labs: 12/24/2018: ALT 15; TSH 1.47 08/25/2019: B Natriuretic Peptide 119.8; BUN 16; Creatinine, Ser 1.43; Hemoglobin 15.6; Platelets 246; Potassium 3.2; Sodium 144   Recent Lipid Panel Lab  Results  Component Value Date/Time   CHOL 114 12/24/2018 08:20 AM   TRIG 55.0 12/24/2018 08:20 AM   HDL 49.70 12/24/2018 08:20 AM   CHOLHDL 2 12/24/2018 08:20 AM   LDLCALC 54 12/24/2018 08:20 AM    Physical Exam:    VS:  There were no vitals taken for this visit.    Wt Readings from Last 3 Encounters:  08/25/19 (!) 339 lb (153.8 kg)  12/23/18 (!) 326 lb (147.9 kg)  11/26/18 (!) 326 lb (147.9 kg)     Physical Exam ***  ASSESSMENT & PLAN:    ***  Dispo:  No follow-ups on file.   Medication Adjustments/Labs and Tests Ordered: Current medicines  are reviewed at length with the patient today.  Concerns regarding medicines are outlined above.  Tests Ordered: No orders of the defined types were placed in this encounter.  Medication Changes: No orders of the defined types were placed in this encounter.   Signed, [name], PA-C  09/07/2019 8:10 AM    [facility] [address], [place], [place]  [address] Phone: [phone]; Fax: [phone]

## 2019-10-30 ENCOUNTER — Other Ambulatory Visit: Payer: Self-pay | Admitting: Internal Medicine

## 2019-10-30 NOTE — Telephone Encounter (Signed)
Please refill as per office routine med refill policy (all routine meds refilled for 3 mo or monthly per pt preference up to one year from last visit, then month to month grace period for 3 mo, then further med refills will have to be denied)  

## 2019-11-20 ENCOUNTER — Ambulatory Visit (INDEPENDENT_AMBULATORY_CARE_PROVIDER_SITE_OTHER): Payer: Medicare HMO | Admitting: Internal Medicine

## 2019-11-20 ENCOUNTER — Encounter: Payer: Self-pay | Admitting: Internal Medicine

## 2019-11-20 ENCOUNTER — Other Ambulatory Visit: Payer: Self-pay

## 2019-11-20 VITALS — BP 175/88 | HR 75 | Temp 98.5°F | Ht 73.0 in | Wt 336.0 lb

## 2019-11-20 DIAGNOSIS — E1159 Type 2 diabetes mellitus with other circulatory complications: Secondary | ICD-10-CM | POA: Diagnosis not present

## 2019-11-20 DIAGNOSIS — F418 Other specified anxiety disorders: Secondary | ICD-10-CM | POA: Diagnosis not present

## 2019-11-20 DIAGNOSIS — L299 Pruritus, unspecified: Secondary | ICD-10-CM | POA: Diagnosis not present

## 2019-11-20 DIAGNOSIS — I10 Essential (primary) hypertension: Secondary | ICD-10-CM

## 2019-11-20 DIAGNOSIS — I5032 Chronic diastolic (congestive) heart failure: Secondary | ICD-10-CM

## 2019-11-20 DIAGNOSIS — M545 Low back pain: Secondary | ICD-10-CM

## 2019-11-20 DIAGNOSIS — N183 Chronic kidney disease, stage 3 unspecified: Secondary | ICD-10-CM

## 2019-11-20 DIAGNOSIS — G8929 Other chronic pain: Secondary | ICD-10-CM

## 2019-11-20 DIAGNOSIS — E611 Iron deficiency: Secondary | ICD-10-CM

## 2019-11-20 DIAGNOSIS — E538 Deficiency of other specified B group vitamins: Secondary | ICD-10-CM | POA: Diagnosis not present

## 2019-11-20 DIAGNOSIS — E559 Vitamin D deficiency, unspecified: Secondary | ICD-10-CM

## 2019-11-20 DIAGNOSIS — E782 Mixed hyperlipidemia: Secondary | ICD-10-CM

## 2019-11-20 DIAGNOSIS — Z0000 Encounter for general adult medical examination without abnormal findings: Secondary | ICD-10-CM

## 2019-11-20 DIAGNOSIS — Z0001 Encounter for general adult medical examination with abnormal findings: Secondary | ICD-10-CM

## 2019-11-20 DIAGNOSIS — I2510 Atherosclerotic heart disease of native coronary artery without angina pectoris: Secondary | ICD-10-CM

## 2019-11-20 LAB — HEPATIC FUNCTION PANEL
ALT: 14 U/L (ref 0–53)
AST: 16 U/L (ref 0–37)
Albumin: 4 g/dL (ref 3.5–5.2)
Alkaline Phosphatase: 83 U/L (ref 39–117)
Bilirubin, Direct: 0.1 mg/dL (ref 0.0–0.3)
Total Bilirubin: 0.7 mg/dL (ref 0.2–1.2)
Total Protein: 6.7 g/dL (ref 6.0–8.3)

## 2019-11-20 LAB — CBC WITH DIFFERENTIAL/PLATELET
Basophils Absolute: 0 10*3/uL (ref 0.0–0.1)
Basophils Relative: 0.3 % (ref 0.0–3.0)
Eosinophils Absolute: 0.2 10*3/uL (ref 0.0–0.7)
Eosinophils Relative: 2.3 % (ref 0.0–5.0)
HCT: 47.1 % (ref 39.0–52.0)
Hemoglobin: 15.2 g/dL (ref 13.0–17.0)
Lymphocytes Relative: 52.2 % — ABNORMAL HIGH (ref 12.0–46.0)
Lymphs Abs: 3.5 10*3/uL (ref 0.7–4.0)
MCHC: 32.2 g/dL (ref 30.0–36.0)
MCV: 90.7 fl (ref 78.0–100.0)
Monocytes Absolute: 0.8 10*3/uL (ref 0.1–1.0)
Monocytes Relative: 11.4 % (ref 3.0–12.0)
Neutro Abs: 2.2 10*3/uL (ref 1.4–7.7)
Neutrophils Relative %: 33.8 % — ABNORMAL LOW (ref 43.0–77.0)
Platelets: 181 10*3/uL (ref 150.0–400.0)
RBC: 5.2 Mil/uL (ref 4.22–5.81)
RDW: 15.5 % (ref 11.5–15.5)
WBC: 6.6 10*3/uL (ref 4.0–10.5)

## 2019-11-20 LAB — IBC PANEL
Iron: 96 ug/dL (ref 42–165)
Saturation Ratios: 32.7 % (ref 20.0–50.0)
Transferrin: 210 mg/dL — ABNORMAL LOW (ref 212.0–360.0)

## 2019-11-20 LAB — BASIC METABOLIC PANEL
BUN: 14 mg/dL (ref 6–23)
CO2: 31 mEq/L (ref 19–32)
Calcium: 9.6 mg/dL (ref 8.4–10.5)
Chloride: 108 mEq/L (ref 96–112)
Creatinine, Ser: 1.53 mg/dL — ABNORMAL HIGH (ref 0.40–1.50)
GFR: 55.94 mL/min — ABNORMAL LOW (ref >60.00)
Glucose, Bld: 101 mg/dL — ABNORMAL HIGH (ref 70–99)
Potassium: 3.9 mEq/L (ref 3.5–5.1)
Sodium: 144 mEq/L (ref 135–145)

## 2019-11-20 LAB — LIPID PANEL
Cholesterol: 230 mg/dL — ABNORMAL HIGH (ref 0–200)
HDL: 49.9 mg/dL (ref >39.00)
LDL Cholesterol: 169 mg/dL — ABNORMAL HIGH (ref 0–99)
NonHDL: 180.17
Total CHOL/HDL Ratio: 5
Triglycerides: 55 mg/dL (ref 0.0–149.0)
VLDL: 11 mg/dL (ref 0.0–40.0)

## 2019-11-20 LAB — BRAIN NATRIURETIC PEPTIDE: Pro B Natriuretic peptide (BNP): 334 pg/mL — ABNORMAL HIGH (ref 0.0–100.0)

## 2019-11-20 LAB — VITAMIN B12: Vitamin B-12: 189 pg/mL — ABNORMAL LOW (ref 211–911)

## 2019-11-20 LAB — PSA: PSA: 1.49 ng/mL (ref 0.10–4.00)

## 2019-11-20 LAB — VITAMIN D 25 HYDROXY (VIT D DEFICIENCY, FRACTURES): VITD: 8.37 ng/mL — ABNORMAL LOW (ref 30.00–100.00)

## 2019-11-20 LAB — TSH: TSH: 1.44 u[IU]/mL (ref 0.35–4.50)

## 2019-11-20 MED ORDER — GABAPENTIN 300 MG PO CAPS: 300.0000 mg | ORAL_CAPSULE | Freq: Three times a day (TID) | ORAL | 1 refills | Status: DC

## 2019-11-20 MED ORDER — ROSUVASTATIN CALCIUM 40 MG PO TABS: 40.0000 mg | ORAL_TABLET | Freq: Every day | ORAL | 3 refills | Status: DC

## 2019-11-20 MED ORDER — ISOSORBIDE MONONITRATE ER 30 MG PO TB24: 90.0000 mg | ORAL_TABLET | Freq: Every day | ORAL | 3 refills | Status: DC

## 2019-11-20 MED ORDER — ALBUTEROL SULFATE HFA 108 (90 BASE) MCG/ACT IN AERS: 2.0000 | INHALATION_SPRAY | Freq: Four times a day (QID) | RESPIRATORY_TRACT | 3 refills | Status: DC | PRN

## 2019-11-20 MED ORDER — PANTOPRAZOLE SODIUM 40 MG PO TBEC: 40.0000 mg | DELAYED_RELEASE_TABLET | Freq: Every day | ORAL | 3 refills | Status: DC

## 2019-11-20 MED ORDER — TORSEMIDE 20 MG PO TABS: 20.0000 mg | ORAL_TABLET | Freq: Two times a day (BID) | ORAL | 3 refills | Status: DC

## 2019-11-20 MED ORDER — POTASSIUM CHLORIDE CRYS ER 10 MEQ PO TBCR: EXTENDED_RELEASE_TABLET | ORAL | 3 refills | Status: DC

## 2019-11-20 MED ORDER — CETIRIZINE HCL 10 MG PO TABS: ORAL_TABLET | ORAL | 3 refills | Status: DC

## 2019-11-20 MED ORDER — FLUTICASONE-SALMETEROL 250-50 MCG/DOSE IN AEPB: 1.0000 | INHALATION_SPRAY | Freq: Two times a day (BID) | RESPIRATORY_TRACT | 3 refills | Status: DC

## 2019-11-20 MED ORDER — GLIPIZIDE ER 2.5 MG PO TB24: 2.5000 mg | ORAL_TABLET | Freq: Every day | ORAL | 3 refills | Status: DC

## 2019-11-20 MED ORDER — CARVEDILOL 12.5 MG PO TABS: 12.5000 mg | ORAL_TABLET | Freq: Two times a day (BID) | ORAL | 3 refills | Status: DC

## 2019-11-20 MED ORDER — FLUTICASONE-SALMETEROL 250-50 MCG/DOSE IN AEPB
1.0000 | INHALATION_SPRAY | Freq: Two times a day (BID) | RESPIRATORY_TRACT | 3 refills | Status: DC
Start: 2019-11-20 — End: 2019-11-20

## 2019-11-20 MED ORDER — CLOPIDOGREL BISULFATE 75 MG PO TABS: 75.0000 mg | ORAL_TABLET | Freq: Every day | ORAL | 3 refills | Status: DC

## 2019-11-20 MED ORDER — NITROGLYCERIN 0.4 MG SL SUBL: SUBLINGUAL_TABLET | SUBLINGUAL | 5 refills | Status: DC

## 2019-11-20 MED ORDER — CARVEDILOL 12.5 MG PO TABS
12.5000 mg | ORAL_TABLET | Freq: Two times a day (BID) | ORAL | 3 refills | Status: DC
Start: 2019-11-20 — End: 2019-11-20

## 2019-11-20 NOTE — Progress Notes (Signed)
Subjective:    Patient ID: [name] [name], male    DOB: 1956-04-07, 64 y.o.   MRN: [id number]  HPI  Here for wellness and f/u;  Overall doing ok;  Pt denies Chest pain, worsening SOB, DOE, wheezing, orthopnea, PND, worsening LE edema, palpitations, dizziness or syncope.  Pt denies neurological change such as new headache, facial or extremity weakness.  Pt denies polydipsia, polyuria, or low sugar symptoms. Pt states overall good compliance with treatment and medications, good tolerability, and has been trying to follow appropriate diet.  Pt denies worsening depressive symptoms, suicidal ideation or panic. No fever, night sweats, wt loss, loss of appetite, or other constitutional symptoms.  Pt states good ability with ADL's, has low fall risk, home safety reviewed and adequate, no other significant changes in hearing or vision, and only occasionally active with exercise. ALso, due for cardiology f/u.  Has not taken BP meds in the past wk.  Due for multiple other DM related referrals including optho, podiatry, and nutrition, also psychiatry = admits to mod worsening depressive symptoms, but no suicidal ideation, or panic; has ongoing anxiety and intermittent compliance with medical treatment.  Also Pt continues to have recurring LBP without change in severity, bowel or bladder change, fever, wt loss,  worsening LE pain/numbness/weakness, gait change or falls.  Also with quite bothersome itching mostly to the extremities but no rash, thinks it may be due to renal failure since a doctor told him one time he had ckd4 and scared him Past Medical History:  Diagnosis Date  . CAD (coronary artery disease)    a. s/p PCI/BMS pRCA 2002; b. PCI pRCA & DES p/m RCA 2006; c. PCI/DES OM4 2007; d. PCI/CBA to RCA for ISR 10/2006; e.08/2012 STEMI/Cath/PCI:  LAD 95% >> PCI: 3.0x20 Promus Prem DES  //  f. NSTEMI 10/15 >> LHC: mLAD stent ok, dLAD 70, OM1 CTO, OM2 stent ok, dOM2 90, OM3 30-40, dLCx 90, p-mRCA stent ok, mRCA stent  ok w/ 60-70 ISR, EF 50% >> med Rx  . Chronic combined systolic and diastolic CHF (congestive heart failure) ([place])    a. Myoview 1/17: EF 35%  //  b. Echo 1/17 EF 45-50%  . Depression   . Diabetes mellitus ([place])    a. A1c 8.8 08/2012->Metformin initiated. => b. A1c (9/14): 6.6  . GERD (gastroesophageal reflux disease)   . History of nuclear stress test    a. Myoview 1/17: EF 35%, fixed inferior lateral defect consistent with infarct, no ischemia, intermediate risk  . History of pneumonia   . HTN (hypertension)   . Hyperlipidemia   . Ischemic cardiomyopathy    a. EF 40%; improved to normal;  b. 08/2012 Echo: EF 50-55%, mod LVH.//  c. Echo 9/16: Inf HK, mild LVH, EF 55%, mild LAE, normal RVF, mild RAE, PASP 35 mmHg  //  d. Echo 1/17: EF 45-50%, inferior HK, mild BAE, PASP 33 mmHg  . NSTEMI (non-ST elevated myocardial infarction) ([place])   . Obesity   . OSA (obstructive sleep apnea)    Does not use CPAP as of 05/2011  . Tobacco abuse    Past Surgical History:  Procedure Laterality Date  . CORONARY ANGIOPLASTY WITH STENT PLACEMENT    . CORONARY STENT PLACEMENT  2009  . LEFT HEART CATHETERIZATION WITH CORONARY ANGIOGRAM N/A 08/31/2012   Procedure: LEFT HEART CATHETERIZATION WITH CORONARY ANGIOGRAM;  Surgeon: Burnell Blanks, MD;  Location[facility] CATH LAB;  Service: Cardiovascular;  Laterality: N/A;  . LEFT HEART CATHETERIZATION  WITH CORONARY ANGIOGRAM N/A 11/16/2013   Procedure: LEFT HEART CATHETERIZATION WITH CORONARY ANGIOGRAM;  Surgeon: Blane Ohara, MD;  Location: [facility] CATH LAB;  Service: Cardiovascular;  Laterality: N/A;  . LEFT HEART CATHETERIZATION WITH CORONARY ANGIOGRAM N/A 03/15/2014   Procedure: LEFT HEART CATHETERIZATION WITH CORONARY ANGIOGRAM;  Surgeon: Peter M Martinique, MD;  Location: [facility] CATH LAB;  Service: Cardiovascular;  Laterality: N/A;  . PERCUTANEOUS CORONARY STENT INTERVENTION (PCI-S) Right 08/31/2012   Procedure: PERCUTANEOUS CORONARY STENT INTERVENTION (PCI-S);  Surgeon:  Burnell Blanks, MD;  Location: [facility] CATH LAB;  Service: Cardiovascular;  Laterality: Right;  . PERCUTANEOUS CORONARY STENT INTERVENTION (PCI-S)  11/16/2013   Procedure: PERCUTANEOUS CORONARY STENT INTERVENTION (PCI-S);  Surgeon: Blane Ohara, MD;  Location: [facility] CATH LAB;  Service: Cardiovascular;;    reports that he has quit smoking. His smoking use included cigarettes. He has a 15.00 pack-year smoking history. He has never used smokeless tobacco. He reports that he does not drink alcohol and does not use drugs. family history includes Cancer in his mother; Coronary artery disease in an other family member; Depression in his mother; Hypertension in his mother; Other in his father. Allergies  Allergen Reactions  . Penicillin G Other (See Comments)    Did it involve swelling of the face/tongue/throat, SOB, or low BP?  N/A Did it involve sudden or severe rash/hives, skin peeling, or any reaction on the inside of your mouth or nose? N/A Did you need to seek medical attention at a hospital or doctor's office? N/A When did it last happen?Child If all above answers are "NO", may proceed with cephalosporin use.  [name] Penicillins Other (See Comments)    Did it involve swelling of the face/tongue/throat, SOB, or low BP? N/A Did it involve sudden or severe rash/hives, skin peeling, or any reaction on the inside of your mouth or nose?N/A Did you need to seek medical attention at a hospital or doctor's office? N/A When did it last happen?Child If all above answers are "NO", may proceed with cephalosporin use.   Current Outpatient Medications on File Prior to Visit  Medication Sig Dispense Refill  . aspirin 81 MG tablet Take 81 mg by mouth daily.     No current facility-administered medications on file prior to visit.   Review of Systems All otherwise neg per pt    Objective:   Physical Exam BP (!) 175/88 (BP Location: Left Arm, Patient Position: Sitting, Cuff Size: Large)   Pulse 75    Temp 98.5 F (36.9 C) (Oral)   Ht 6\' 1"  (1.854 m)   Wt (!) 336 lb (152.4 kg)   SpO2 95%   BMI 44.33 kg/m  VS noted,  Constitutional: Pt appears in NAD HENT: Head: NCAT.  Right Ear: External ear normal.  Left Ear: External ear normal.  Eyes: . Pupils are equal, round, and reactive to light. Conjunctivae and EOM are normal Nose: without d/c or deformity Neck: Neck supple. Gross normal ROM Cardiovascular: Normal rate and regular rhythm.   Pulmonary/Chest: Effort normal and breath sounds without rales or wheezing.  Abd:  Soft, NT, ND, + BS, no organomegaly Neurological: Pt is alert. At baseline orientation, motor grossly intact Skin: Skin is warm. No rashes, other new lesions, trace to 1+ LE edema Psychiatric: Pt behavior is normal without agitation  All otherwise neg per pt Lab Results  Component Value Date   WBC 7.0 08/25/2019   HGB 15.6 08/25/2019   HCT 49.9 08/25/2019   PLT 246 08/25/2019  GLUCOSE 124 (H) 08/25/2019   CHOL 114 12/24/2018   TRIG 55.0 12/24/2018   HDL 49.70 12/24/2018   LDLCALC 54 12/24/2018   ALT 15 12/24/2018   AST 18 12/24/2018   NA 144 08/25/2019   K 3.2 (L) 08/25/2019   CL 104 08/25/2019   CREATININE 1.43 (H) 08/25/2019   BUN 16 08/25/2019   CO2 30 08/25/2019   TSH 1.47 12/24/2018   PSA 1.28 12/24/2018   INR 1.00 05/29/2017   HGBA1C 7.1 (H) 12/24/2018   MICROALBUR 49.0 (H) 05/14/2017        Assessment & Plan:

## 2019-11-20 NOTE — Patient Instructions (Signed)
Please restart all of your medications as prescribed  Please continue all other medications as before, and refills have been done if requested.  Please have the pharmacy call with any other refills you may need.  Please continue your efforts at being more active, low cholesterol diet, and weight control.  You are otherwise up to date with prevention measures today.  Please keep your appointments with your specialists as you may have planned  You will be contacted regarding the referral for:  Cardiology, diabetic education (diet), eye doctor, podiatry, and psychiatry  Please go to the LAB at the blood drawing area for the tests to be done  You will be contacted by phone if any changes need to be made immediately.  Otherwise, you will receive a letter about your results with an explanation, but please check with MyChart first.  Please remember to sign up for MyChart if you have not done so, as this will be important to you in the future with finding out test results, communicating by private email, and scheduling acute appointments online when needed.  Please make an Appointment to return in 3 months, or sooner if needed

## 2019-11-22 ENCOUNTER — Encounter: Payer: Self-pay | Admitting: Internal Medicine

## 2019-11-22 DIAGNOSIS — L299 Pruritus, unspecified: Secondary | ICD-10-CM | POA: Insufficient documentation

## 2019-11-22 NOTE — Assessment & Plan Note (Signed)
Worsening, no SI or hi, for psych referral

## 2019-11-22 NOTE — Assessment & Plan Note (Signed)
For podiatry, dm education, optho referrals, med restart,  to f/u any worsening symptoms or concerns

## 2019-11-22 NOTE — Assessment & Plan Note (Signed)
Uncontrolled, for med restart,  to f/u any worsening symptoms or concerns

## 2019-11-22 NOTE — Assessment & Plan Note (Signed)
stable overall by history and exam, recent data reviewed with pt, and pt to continue medical treatment as before,  to f/u any worsening symptoms or concerns  

## 2019-11-22 NOTE — Assessment & Plan Note (Signed)
For low chol dM diet, stable overall by history and exam, recent data reviewed with pt, and pt to continue medical treatment as before,  to f/u any worsening symptoms or concerns, for f.u. lab

## 2019-11-22 NOTE — Assessment & Plan Note (Addendum)
For med restart, refer cardiology f/u  I spent 31 minutes in addition to time for CPX wellness examination in preparing to see the patient by review of recent labs, imaging and procedures, obtaining and reviewing separately obtained history, communicating with the patient and family or caregiver, ordering medications, tests or procedures, and documenting clinical information in the EHR including the differential Dx, treatment, and any further evaluation and other management of chf, htn, dm, depression, chronic lbp, itching

## 2019-11-22 NOTE — Assessment & Plan Note (Addendum)
Mild, for bendaryl prn. No evidence this is related to ckd, reassured  I spent 31 minutes in addition to time for CPX wellness examination in preparing to see the patient by review of recent labs, imaging and procedures, obtaining and reviewing separately obtained history, communicating with the patient and family or caregiver, ordering medications, tests or procedures, and documenting clinical information in the EHR including the differential Dx, treatment, and any further evaluation and other management of itching, chronic lbp, chf, htn, dm, depression, ckd, hld

## 2019-11-23 LAB — HEMOGLOBIN A1C: Hgb A1c MFr Bld: 7.3 % — ABNORMAL HIGH (ref 4.6–6.5)

## 2019-11-25 ENCOUNTER — Encounter: Payer: Self-pay | Admitting: Internal Medicine

## 2019-11-25 ENCOUNTER — Telehealth: Payer: Self-pay | Admitting: Internal Medicine

## 2019-11-25 ENCOUNTER — Other Ambulatory Visit: Payer: Self-pay | Admitting: Internal Medicine

## 2019-11-25 MED ORDER — BLOOD GLUCOSE METER KIT: PACK | 0 refills | Status: DC

## 2019-11-25 MED ORDER — VITAMIN D (ERGOCALCIFEROL) 1.25 MG (50000 UNIT) PO CAPS: 50000.0000 [IU] | ORAL_CAPSULE | ORAL | 0 refills | Status: DC

## 2019-11-25 MED ORDER — VITAMIN B-12 1000 MCG PO TABS
1000.0000 ug | ORAL_TABLET | Freq: Every day | ORAL | 3 refills | Status: DC
Start: 2019-11-25 — End: 2022-09-17

## 2019-11-25 NOTE — Telephone Encounter (Signed)
New message:   Pt is calling and states that he needs a script sent to [facility] for them to approve him for a new Accu Check glucose meter. Please advise.

## 2019-11-25 NOTE — Telephone Encounter (Signed)
Script for new meter has been sent.

## 2019-12-25 ENCOUNTER — Encounter: Payer: Self-pay | Admitting: Podiatry

## 2019-12-25 ENCOUNTER — Other Ambulatory Visit: Payer: Self-pay

## 2019-12-25 ENCOUNTER — Ambulatory Visit (INDEPENDENT_AMBULATORY_CARE_PROVIDER_SITE_OTHER): Payer: Medicare HMO | Admitting: Podiatry

## 2019-12-25 DIAGNOSIS — L989 Disorder of the skin and subcutaneous tissue, unspecified: Secondary | ICD-10-CM | POA: Diagnosis not present

## 2019-12-25 DIAGNOSIS — B351 Tinea unguium: Secondary | ICD-10-CM

## 2019-12-25 DIAGNOSIS — M79675 Pain in left toe(s): Secondary | ICD-10-CM | POA: Diagnosis not present

## 2019-12-25 DIAGNOSIS — M2040 Other hammer toe(s) (acquired), unspecified foot: Secondary | ICD-10-CM | POA: Diagnosis not present

## 2019-12-25 DIAGNOSIS — M79674 Pain in right toe(s): Secondary | ICD-10-CM | POA: Diagnosis not present

## 2019-12-25 DIAGNOSIS — E1142 Type 2 diabetes mellitus with diabetic polyneuropathy: Secondary | ICD-10-CM | POA: Diagnosis not present

## 2019-12-25 DIAGNOSIS — L84 Corns and callosities: Secondary | ICD-10-CM | POA: Diagnosis not present

## 2019-12-25 NOTE — Progress Notes (Signed)
This patient returns to my office for at risk foot care.  This patient requires this care by a professional since this patient will be at risk due to having pre-ulcerative calluses, CKD and diabetic neuropathy and coagulation defect.  Patient is taking plavix.  This patient is unable to cut nails himself since the patient cannot reach his nails.These nails are painful walking and wearing shoes.  This patient presents for at risk foot care today.  Patient has not been seen in over 10 months.  General Appearance  Alert, conversant and in no acute stress.  Vascular  Dorsalis pedis and posterior tibial  pulses are palpable  bilaterally.  Capillary return is within normal limits  bilaterally. Temperature is within normal limits  bilaterally.  Neurologic  Senn-Weinstein monofilament wire test diminished   bilaterally. Muscle power within normal limits bilaterally.  Nails Thick disfigured discolored nails with subungual debris  from hallux to fifth toes bilaterally. No evidence of bacterial infection or drainage bilaterally.  Orthopedic  No limitations of motion  feet .  No crepitus or effusions noted.  No bony pathology or digital deformities noted.  Plantar flexed second metatarsal  B/L.  Skin  normotropic skin  noted bilaterally.  No signs of infections or ulcers noted.   Pre-ulcerous callus sub 2  B/L.  Onychomycosis  Pain in right toes  Pain in left toes  Porokeratosis/Pre-ulcerous callus  B/L.  Consent was obtained for treatment procedures.   Mechanical debridement of nails 1-5  bilaterally performed with a nail nipper.  Filed with dremel without incident. Porokeratosis debrided with # 15 blade.  Patient qualifies for diabetic shoes due to DPN, preulcerous callus  B/L and hammer toes 2-4  B/L. Patient to see Benjie Karvonen today.   Return office visit     3 months                Told patient to return for periodic foot care and evaluation due to potential at risk complications.   Gardiner Barefoot DPM

## 2019-12-28 ENCOUNTER — Other Ambulatory Visit: Payer: Self-pay

## 2019-12-28 ENCOUNTER — Encounter: Payer: Self-pay | Admitting: Physician Assistant

## 2019-12-28 DIAGNOSIS — E1159 Type 2 diabetes mellitus with other circulatory complications: Secondary | ICD-10-CM

## 2019-12-28 MED ORDER — BD SWABS SINGLE USE BUTTERFLY PADS: MEDICATED_PAD | 11 refills | Status: DC

## 2019-12-28 MED ORDER — BLOOD GLUCOSE METER KIT: PACK | 0 refills | Status: DC

## 2019-12-28 MED ORDER — TRUE METRIX LEVEL 1 LOW VI SOLN: 0 refills | Status: DC

## 2019-12-28 MED ORDER — LANCETS 33G MISC: 6 refills | Status: DC

## 2019-12-28 MED ORDER — TRUE METRIX BLOOD GLUCOSE TEST VI STRP: ORAL_STRIP | 12 refills | Status: DC

## 2019-12-28 NOTE — Progress Notes (Signed)
Cardiology Office Note:    Date:  12/29/2019   ID:  [name], DOB [birth year], MRN [id number]  PCP:  Biagio Borg, MD  Cardiologist:  Sherren Mocha, MD / [name], PA-C  Electrophysiologist:  None   Referring MD: Biagio Borg, MD   Chief Complaint:  Follow-up (CAD, CHF)    Patient Profile:    [name] [name] is a 64 y.o. male with:   Coronary artery disease   S/p PCI to RCA and PL1  S/p cutting POBA to RCA 2/2 ISR in 5/08  S/p ant STEMI in 3/14 >> PCI: DES to LAD  S/p NSTEMI in 6/15 >> PCI: DES to [place]  S/p NSTEMI 10/15 >> Med Rx  Myoview 1/17: no ischemia  Cath at [facility] 05/2019: RCA 100 CTO  HFrEF w/ return of normal LVF  Ischemic CM  Combined Systolic and Diastolic CHF  Echocardiogram 05/2019 ([facility]): EF 50-55  Hypertension   Hyperlipidemia   Chronic kidney disease  Diabetes mellitus   OSA   GERD  Tobacco abuse   Prior CV studies:   Cardiac catheterization 05/14/2019 ([facility]) LM normal LAD diff irregs LCx diffuse irregs RCA mid 100 (CTO)  Echocardiogram 05/13/2019 ([facility]) Mod conc LVH, inf AK and likely post HK, EF 50-55  Echocardiogram 05/31/17 Severe conc LVH, EF 55-60, no RWMA, Gr 1 DD, PASP 47  Echo 07/07/15 EF 45-50%, inferior HK, mild BAE, PASP 33 mmHg  Myoview 06/16/15 Intermediate risk stress nuclear study with a large, severe, predominantly fixed inferior lateral defect consistent with prior infarct; no significant ischemia; EF 35 but visually appears better; akinesis of the basal and mid inferior lateral wall; mild LVE; suggest echo to better assess LV function. Study intermediate risk due to reduced LV function.  Echo 9/21/6 Inf HK, mild LVH, EF 55%, mild LAE, normal RVF, mild RAE, PASP 35 mmHg  LHC (10/15):  Proximal LAD ectatic, mid LAD stent patent, distal LAD 70% at the apex, proximal and mid circumflex diffuse ectasia, small OM1 with CTO, OM2 stent patent, distal OM2 90%, OM3 30-40%, very distal circumflex 90%,  proximal to mid RCA stent patent, mid RCA stent 60-70% ISR, EF 50%, inferobasal akinesis  Echo (6/15):  Mild LVH. EF 50% to 55%. Wall motion was normal. Grade 1 diastolic dysfunction. Left atrium: The atrium was mildly dilated. Right ventricle: The cavity size was moderately dilated. Right atrium: The atrium was mildly to moderately dilated.  Myoview 9/12:  mod scar in base/mid inf and IL segments, slight peri-infarct ischemia - low risk (med Rx continued).  Carotid US (4/13):  Bilateral: no ICA stenosis   History of Present Illness:    Mr. Smigiel was last seen in clinic in 06/2017 by Leanor Kail, PA-C.  He was admitted to [facility] in 05/2019 with decompensated diastolic CHF with associated elevated Troponin.  He underwent cardiac catheterization which demonstrated a chronically occluded RCA and no significant disease elsewhere.  Med Rx was continued.  An echocardiogram demonstrated an EF of 50-55%.     He returns for follow up.   He is here alone.  He has been fatigued.  He saw primary care recently.  He has been started on vitamin D as well as B12.  He is starting to feel somewhat better.  He has occasional chest discomfort.  This is sharp and brief.  It only lasts seconds.  He has not with exertion and at rest.  He does not like his previous myocardial infarction  symptoms.  He has no associated symptoms.  His breathing is stable.  He does know when he is gaining excess fluid.  His breathing worsens.  He does try to weigh himself daily.  He sleeps on 2 pillows.  He has not had leg swelling or syncope.  Past Medical History:  Diagnosis Date  . CAD (coronary artery disease)    a. s/p BMS pRCA 2002; b. DES to pRCA & DES p/m RCA 2006; c. PCI/DES OM4 2007; d. PCI/CBA to RCA for ISR 10/2006; e.08/2012 STEMI/Cath/PCI:  LAD 95% >> PCI: Promus Prem DES  //  f. NSTEMI 10/15 >> LHC: mLAD stent ok, dLAD 70, OM1 CTO, OM2 stent ok, dOM2 90, OM3 30-40, dLCx 90, p-mRCA  stent ok, mRCA stent ok w/ 60-70 ISR, EF 50% >> med Rx // MV 1/17: no ischemia // Cath ([facility]) 05/2019: RCA 100 (CTO)   . Combined systolic and diastolic CHF    a. Myoview 1/17: EF 35%  //  b. Echo 1/17 EF 45-50% // Echo 05/2019: EF 50-55  . Depression   . Diabetes mellitus ([place])    a. A1c 8.8 08/2012->Metformin initiated. => b. A1c (9/14): 6.6  . Gastroesophageal reflux disease   . History of nuclear stress test    a. Myoview 1/17: EF 35%, fixed inferior lateral defect consistent with infarct, no ischemia, intermediate risk  . History of pneumonia   . HTN (hypertension)   . Hx of NSTEMI   . Hyperlipidemia   . Ischemic cardiomyopathy    a. EF 40%; improved to normal;  b. 08/2012 Echo: EF 50-55%, mod LVH.//  c. Echo 9/16: Inf HK, mild LVH, EF 55%, mild LAE, normal RVF, mild RAE, PASP 35 mmHg  //  d. Echo 1/17: EF 45-50%, inferior HK, mild BAE, PASP 33 mmHg  . Obesity   . OSA (obstructive sleep apnea)    Does not use CPAP as of 05/2011  . Tobacco abuse     Current Medications: Current Meds  Medication Sig  . albuterol (VENTOLIN HFA) 108 (90 Base) MCG/ACT inhaler Inhale 2 puffs into the lungs every 6 (six) hours as needed for wheezing or shortness of breath.  . Alcohol Swabs (B-D SINGLE USE SWABS BUTTERFLY) PADS Use up to 4 times a day to test blood sugars. Dx: E10.9, E11.9  . aspirin 81 MG tablet Take 81 mg by mouth daily.  . Blood Glucose Calibration (TRUE METRIX LEVEL 1) Low SOLN Use as needed. Dx: E10.9, E11.9  . blood glucose meter kit and supplies Dispense based on patient and insurance preference. Use up to four times daily as directed. (FOR ICD-10 E10.9, E11.9).  . carvedilol (COREG) 12.5 MG tablet Take 1.5 tablets (18.75 mg total) by mouth 2 (two) times daily with a meal.  . cetirizine (ZYRTEC) 10 MG tablet 1 tab by mouth daily  . clopidogrel (PLAVIX) 75 MG tablet Take 1 tablet (75 mg total) by mouth daily.  . Fluticasone-Salmeterol (ADVAIR DISKUS) 250-50 MCG/DOSE AEPB Inhale 1 puff  into the lungs 2 (two) times daily.  . furosemide (LASIX) 40 MG tablet Take 40 mg by mouth 2 (two) times daily.  [name] gabapentin (NEURONTIN) 300 MG capsule Take 1 capsule (300 mg total) by mouth 3 (three) times daily.  [name] glipiZIDE (GLUCOTROL XL) 2.5 MG 24 hr tablet Take 1 tablet (2.5 mg total) by mouth daily.  [name] glucose blood (TRUE METRIX BLOOD GLUCOSE TEST) test strip Use to test blood sugars up to 4 times a day. DX: E10.9,  E.11.9  . isosorbide mononitrate (IMDUR) 30 MG 24 hr tablet Take 3 tablets (90 mg total) by mouth daily.  . Lancets 33G MISC Use to test blood sugars up to 4 times a day. DX: E10.9, E11.9  . nitroGLYCERIN (NITROSTAT) 0.4 MG SL tablet PLACE 1 TABLET UNDER THE TONGUE EVERY 5 MINUTES AS NEEDED FOR CHEST PAIN  . pantoprazole (PROTONIX) 40 MG tablet Take 1 tablet (40 mg total) by mouth daily.  . potassium chloride (KLOR-CON) 10 MEQ tablet TAKE 2 TABLETS BY MOUTH EVERY MORNING AND 1 TABLET EVERY EVENING  . rosuvastatin (CRESTOR) 40 MG tablet Take 1 tablet (40 mg total) by mouth daily.  . traMADol (ULTRAM) 50 MG tablet Take 50 mg by mouth as needed for moderate pain.  . vitamin B-12 (CYANOCOBALAMIN) 1000 MCG tablet Take 1 tablet (1,000 mcg total) by mouth daily.  . Vitamin D, Ergocalciferol, (DRISDOL) 1.25 MG (50000 UNIT) CAPS capsule Take 1 capsule (50,000 Units total) by mouth every 7 (seven) days.  . [DISCONTINUED] carvedilol (COREG) 12.5 MG tablet Take 1 tablet (12.5 mg total) by mouth 2 (two) times daily with a meal.  . [DISCONTINUED] rosuvastatin (CRESTOR) 40 MG tablet Take 1 tablet (40 mg total) by mouth daily.     Allergies:   Penicillin g and Penicillins   Social History   Tobacco Use  . Smoking status: Former Smoker    Packs/day: 0.50    Years: 30.00    Pack years: 15.00    Types: Cigarettes  . Smokeless tobacco: Never Used  . Tobacco comment: trying to cut down  Vaping Use  . Vaping Use: Never used  Substance Use Topics  . Alcohol use: No  . Drug use: No     Comment: remote marijuana use     Family Hx: The patient's family history includes Cancer in his mother; Coronary artery disease in an other family member; Depression in his mother; Hypertension in his mother; Other in his father. There is no history of Heart attack or Stroke.  Review of Systems  Gastrointestinal: Negative for hematochezia and melena.  Genitourinary: Negative for hematuria.     EKGs/Labs/Other Test Reviewed:    EKG:  EKG is  ordered today.  The ekg ordered today demonstrates normal sinus rhythm, heart rate 87, left anterior fascicular block, right bundle branch block, LVH, PACs/PVCs (non-conducted), 1 couplet  Recent Labs: 08/25/2019: B Natriuretic Peptide 119.8 11/20/2019: ALT 14; BUN 14; Creatinine, Ser 1.53; Hemoglobin 15.2; Platelets 181.0; Potassium 3.9; Pro B Natriuretic peptide (BNP) 334.0; Sodium 144; TSH 1.44   Recent Lipid Panel Lab Results  Component Value Date/Time   CHOL 230 (H) 11/20/2019 12:38 PM   TRIG 55.0 11/20/2019 12:38 PM   HDL 49.90 11/20/2019 12:38 PM   CHOLHDL 5 11/20/2019 12:38 PM   LDLCALC 169 (H) 11/20/2019 12:38 PM    Physical Exam:    VS:  BP 124/90   Pulse 64   Ht _0  (1.854 m)   Wt (!) 325 lb (147.4 kg)   SpO2 96%   BMI 42.88 kg/m     Wt Readings from Last 3 Encounters:  12/29/19 (!) 325 lb (147.4 kg)  11/20/19 (!) 336 lb (152.4 kg)  08/25/19 (!) 339 lb (153.8 kg)     Constitutional:      Appearance: Healthy appearance. Not in distress.  Pulmonary:     Effort: Pulmonary effort is normal.     Breath sounds: No wheezing. No rales.  Cardiovascular:     Normal rate.  Regular rhythm. Normal S1. Normal S2.     Murmurs: There is no murmur.  Edema:    Peripheral edema absent.  Abdominal:     Palpations: Abdomen is soft.  Musculoskeletal:     Cervical back: Neck supple. Skin:    General: Skin is warm and dry.  Neurological:     General: No focal deficit present.     Mental Status: Alert and oriented to person, place  and time.     Cranial Nerves: Cranial nerves are intact.      ASSESSMENT & PLAN:    1. Coronary artery disease involving native coronary artery of native heart without angina pectoris Status post multiple PCI procedures in the setting of ACS and ST elevation myocardial infarctions.  His most recent cardiac catheterization was in the setting of decompensated heart failure.  This was done in [facility].  The study demonstrated a chronically occluded RCA.  There was no significant disease in the LAD and LCx.  He has had been having some atypical chest pain recently.  ECG today demonstrates ventricular ectopy.  He does note some palpitations.  Question if this is what he is feeling.  I will obtain a follow up echocardiogram to reassess his LVF and adjust his beta-blocker.  He knows to contact us if his symptoms should worsen.    -Continue aspirin, clopidogrel, carvedilol, isosorbide  -Increase carvedilol to 18.75 mg twice daily   -Obtain echocardiogram   -FU in 8 weeks    2. Chronic combined systolic and diastolic CHF  He primarily has diastolic heart failure.  His EF was reduced in the past but has improved back to normal.  EF was 50-55 by echocardiogram in December 2020 in [place].  We discussed the importance of limiting salt and daily weights.  Volume status appears stable.  Continue current dose of furosemide.  3. CKD (chronic kidney disease), stage II Recent creatinine 1.53.  If his blood pressure remains elevated, he may benefit from the addition of an ACE inhibitor or ARB.  4. Essential hypertension Blood pressure borderline.  As noted, I have increased his carvedilol to 18.75 mg twice daily.  Continue current dose of carvedilol.  Consider ACE inhibitor versus ARB at follow-up  5. Mixed hyperlipidemia Recent LDL 169.  He was off of statin therapy.  He does not have a prescription for rosuvastatin.  Start rosuvastatin 40 mg daily.  Obtain fasting CMET, lipids in 8 weeks.  6. PVCs He  has a lot of ectopy on his ECG.  QTc is not accurate on his ECG. On my calculation, his QTc is 475.  He has not had syncope or near syncope but has had palpitations.    -BMET, Mg2+ level today  -Echocardiogram to reassess LVF  -Zio x 3 days to rule out VT  -Increase Carvedilol to 18.75 mg twice daily    7. OSA He has not had his CPAP for a while. I will refer back to Dr. Radford Pax for management.     Dispo:  Return in about 8 weeks (around 02/23/2020) for Routine Follow Up, w/ [name], PA-C, in person.   Medication Adjustments/Labs and Tests Ordered: Current medicines are reviewed at length with the patient today.  Concerns regarding medicines are outlined above.  Tests Ordered: Orders Placed This Encounter  Procedures  . Basic metabolic panel  . Magnesium  . Comprehensive metabolic panel  . Lipid panel  . LONG TERM MONITOR (3-14 DAYS)  . EKG 12-Lead  . ECHOCARDIOGRAM  COMPLETE   Medication Changes: Meds ordered this encounter  Medications  . rosuvastatin (CRESTOR) 40 MG tablet    Sig: Take 1 tablet (40 mg total) by mouth daily.    Dispense:  90 tablet    Refill:  1  . carvedilol (COREG) 12.5 MG tablet    Sig: Take 1.5 tablets (18.75 mg total) by mouth 2 (two) times daily with a meal.    Dispense:  270 tablet    Refill:  1    Signed, [name], PA-C  12/29/2019 4:44 PM    [facility] [address], [place], [place]  [address] Phone: [phone]; Fax: [phone]

## 2019-12-29 ENCOUNTER — Encounter: Payer: Self-pay | Admitting: Physician Assistant

## 2019-12-29 ENCOUNTER — Ambulatory Visit (INDEPENDENT_AMBULATORY_CARE_PROVIDER_SITE_OTHER): Payer: Medicare HMO | Admitting: Physician Assistant

## 2019-12-29 ENCOUNTER — Other Ambulatory Visit: Payer: Self-pay

## 2019-12-29 VITALS — BP 124/90 | HR 64 | Ht 73.0 in | Wt 325.0 lb

## 2019-12-29 DIAGNOSIS — I493 Ventricular premature depolarization: Secondary | ICD-10-CM | POA: Diagnosis not present

## 2019-12-29 DIAGNOSIS — I10 Essential (primary) hypertension: Secondary | ICD-10-CM | POA: Diagnosis not present

## 2019-12-29 DIAGNOSIS — I2510 Atherosclerotic heart disease of native coronary artery without angina pectoris: Secondary | ICD-10-CM

## 2019-12-29 DIAGNOSIS — N182 Chronic kidney disease, stage 2 (mild): Secondary | ICD-10-CM | POA: Diagnosis not present

## 2019-12-29 DIAGNOSIS — E782 Mixed hyperlipidemia: Secondary | ICD-10-CM | POA: Diagnosis not present

## 2019-12-29 DIAGNOSIS — I5042 Chronic combined systolic (congestive) and diastolic (congestive) heart failure: Secondary | ICD-10-CM

## 2019-12-29 DIAGNOSIS — G4733 Obstructive sleep apnea (adult) (pediatric): Secondary | ICD-10-CM

## 2019-12-29 MED ORDER — CARVEDILOL 12.5 MG PO TABS: 18.7500 mg | ORAL_TABLET | Freq: Two times a day (BID) | ORAL | 1 refills | Status: DC

## 2019-12-29 MED ORDER — ROSUVASTATIN CALCIUM 40 MG PO TABS: 40.0000 mg | ORAL_TABLET | Freq: Every day | ORAL | 1 refills | Status: DC

## 2019-12-29 NOTE — Patient Instructions (Addendum)
Medication Instructions:  Your physician has recommended you make the following change in your medication:   1) Increase Carvedilol to 18.75MG , 1.5 tablets by mouth twice a day  *If you need a refill on your cardiac medications before your next appointment, please call your pharmacy*  Lab Work: You will have labs drawn today: BMET/Magnesium  Testing/Procedures: Your physician has requested that you have an echocardiogram. Echocardiography is a painless test that uses sound waves to create images of your heart. It provides your doctor with information about the size and shape of your heart and how well your heart's chambers and valves are working. This procedure takes approximately one hour. There are no restrictions for this procedure.  A zio monitor was ordered today. It will remain on for 3 days. You will then return monitor and event diary in provided box. It takes 1-2 weeks for report to be downloaded and returned to [place]. We will call you with the results. If monitor falls off or has orange flashing light, please call Zio for further instructions.   Follow-Up:  On 02/23/20 at 8:15AM with [name], PAC

## 2019-12-30 LAB — BASIC METABOLIC PANEL
BUN/Creatinine Ratio: 12 (ref 10–24)
BUN: 18 mg/dL (ref 8–27)
CO2: 26 mmol/L (ref 20–29)
Calcium: 9.8 mg/dL (ref 8.6–10.2)
Chloride: 101 mmol/L (ref 96–106)
Creatinine, Ser: 1.52 mg/dL — ABNORMAL HIGH (ref 0.76–1.27)
GFR calc Af Amer: 56 mL/min/{1.73_m2} — ABNORMAL LOW (ref >59)
GFR calc non Af Amer: 48 mL/min/{1.73_m2} — ABNORMAL LOW (ref >59)
Glucose: 98 mg/dL (ref 65–99)
Potassium: 3.3 mmol/L — ABNORMAL LOW (ref 3.5–5.2)
Sodium: 143 mmol/L (ref 134–144)

## 2019-12-30 LAB — MAGNESIUM: Magnesium: 2 mg/dL (ref 1.6–2.3)

## 2019-12-31 ENCOUNTER — Telehealth: Payer: Self-pay | Admitting: Physician Assistant

## 2019-12-31 DIAGNOSIS — E876 Hypokalemia: Secondary | ICD-10-CM

## 2019-12-31 DIAGNOSIS — I2510 Atherosclerotic heart disease of native coronary artery without angina pectoris: Secondary | ICD-10-CM

## 2019-12-31 DIAGNOSIS — I5042 Chronic combined systolic (congestive) and diastolic (congestive) heart failure: Secondary | ICD-10-CM

## 2019-12-31 MED ORDER — POTASSIUM CHLORIDE ER 10 MEQ PO TBCR
30.0000 meq | EXTENDED_RELEASE_TABLET | Freq: Two times a day (BID) | ORAL | 2 refills | Status: DC
Start: 2019-12-31 — End: 2020-02-23

## 2019-12-31 NOTE — Telephone Encounter (Signed)
Creatinine stable. Potassium low. Magnesium normal. He is currently taking K+ 20 mEq in the morning and 10 mEq in the evening PLAN:  -Increase K+ to 30 mEq twice daily -BMET 1 week [name], PA-C   12/30/2019 1:40 PM    I spoke with patient and reviewed lab results with him.  Will send prescription for increased potassium dose to [facility] on [address].  He will come in for lab work on 01/07/20.  He is aware monitor will be mailed to him and it takes a few days for it to arrive

## 2019-12-31 NOTE — Telephone Encounter (Signed)
Pt is returning call returning his test results and about the zio monitor that he hasn't received yet. Please call to discuss

## 2020-01-04 ENCOUNTER — Telehealth: Payer: Self-pay | Admitting: Physician Assistant

## 2020-01-04 NOTE — Telephone Encounter (Signed)
New Message:     Pt said the monitor he was wearing fell in some water. He contacted the company about this. They said they need another order so they can send him a new monitor.

## 2020-01-05 NOTE — Telephone Encounter (Signed)
Patient enrolled for [employer] to ship him a replacement monitor.

## 2020-01-07 ENCOUNTER — Other Ambulatory Visit: Payer: Self-pay

## 2020-01-07 ENCOUNTER — Other Ambulatory Visit: Payer: Self-pay | Admitting: *Deleted

## 2020-01-07 ENCOUNTER — Other Ambulatory Visit: Payer: Medicare HMO | Admitting: *Deleted

## 2020-01-07 DIAGNOSIS — I5042 Chronic combined systolic (congestive) and diastolic (congestive) heart failure: Secondary | ICD-10-CM

## 2020-01-07 DIAGNOSIS — I2510 Atherosclerotic heart disease of native coronary artery without angina pectoris: Secondary | ICD-10-CM | POA: Diagnosis not present

## 2020-01-07 DIAGNOSIS — E876 Hypokalemia: Secondary | ICD-10-CM | POA: Diagnosis not present

## 2020-01-07 LAB — BASIC METABOLIC PANEL
BUN/Creatinine Ratio: 11 (ref 10–24)
BUN: 17 mg/dL (ref 8–27)
CO2: 22 mmol/L (ref 20–29)
Calcium: 9.6 mg/dL (ref 8.6–10.2)
Chloride: 101 mmol/L (ref 96–106)
Creatinine, Ser: 1.56 mg/dL — ABNORMAL HIGH (ref 0.76–1.27)
GFR calc Af Amer: 54 mL/min/{1.73_m2} — ABNORMAL LOW (ref >59)
GFR calc non Af Amer: 47 mL/min/{1.73_m2} — ABNORMAL LOW (ref >59)
Glucose: 149 mg/dL — ABNORMAL HIGH (ref 65–99)
Potassium: 3.5 mmol/L (ref 3.5–5.2)
Sodium: 143 mmol/L (ref 134–144)

## 2020-01-07 LAB — SPECIMEN STATUS REPORT

## 2020-01-14 ENCOUNTER — Encounter: Payer: Self-pay | Admitting: Physician Assistant

## 2020-01-14 ENCOUNTER — Ambulatory Visit (HOSPITAL_COMMUNITY): Payer: Medicare HMO | Attending: Internal Medicine

## 2020-01-14 ENCOUNTER — Other Ambulatory Visit: Payer: Self-pay

## 2020-01-14 DIAGNOSIS — I5042 Chronic combined systolic (congestive) and diastolic (congestive) heart failure: Secondary | ICD-10-CM

## 2020-01-14 DIAGNOSIS — I2510 Atherosclerotic heart disease of native coronary artery without angina pectoris: Secondary | ICD-10-CM | POA: Diagnosis not present

## 2020-01-14 LAB — ECHOCARDIOGRAM COMPLETE
Area-P 1/2: 2.91 cm2
S' Lateral: 4.3 cm

## 2020-01-21 ENCOUNTER — Ambulatory Visit (INDEPENDENT_AMBULATORY_CARE_PROVIDER_SITE_OTHER): Payer: Medicare HMO

## 2020-01-21 DIAGNOSIS — I10 Essential (primary) hypertension: Secondary | ICD-10-CM | POA: Diagnosis not present

## 2020-01-21 DIAGNOSIS — I255 Ischemic cardiomyopathy: Secondary | ICD-10-CM | POA: Diagnosis not present

## 2020-01-21 DIAGNOSIS — E782 Mixed hyperlipidemia: Secondary | ICD-10-CM | POA: Diagnosis not present

## 2020-01-21 DIAGNOSIS — N182 Chronic kidney disease, stage 2 (mild): Secondary | ICD-10-CM | POA: Diagnosis not present

## 2020-01-21 DIAGNOSIS — I2510 Atherosclerotic heart disease of native coronary artery without angina pectoris: Secondary | ICD-10-CM

## 2020-01-21 DIAGNOSIS — I493 Ventricular premature depolarization: Secondary | ICD-10-CM

## 2020-01-21 DIAGNOSIS — I5042 Chronic combined systolic (congestive) and diastolic (congestive) heart failure: Secondary | ICD-10-CM | POA: Diagnosis not present

## 2020-01-27 ENCOUNTER — Other Ambulatory Visit: Payer: Self-pay

## 2020-01-27 ENCOUNTER — Ambulatory Visit: Payer: Medicare HMO | Admitting: Orthotics

## 2020-01-27 DIAGNOSIS — M2042 Other hammer toe(s) (acquired), left foot: Secondary | ICD-10-CM | POA: Diagnosis not present

## 2020-01-27 DIAGNOSIS — E1142 Type 2 diabetes mellitus with diabetic polyneuropathy: Secondary | ICD-10-CM | POA: Diagnosis not present

## 2020-01-27 DIAGNOSIS — M2041 Other hammer toe(s) (acquired), right foot: Secondary | ICD-10-CM | POA: Diagnosis not present

## 2020-02-08 ENCOUNTER — Ambulatory Visit: Payer: Medicare HMO | Admitting: Dietician

## 2020-02-10 ENCOUNTER — Other Ambulatory Visit: Payer: Self-pay | Admitting: Physician Assistant

## 2020-02-10 DIAGNOSIS — I10 Essential (primary) hypertension: Secondary | ICD-10-CM

## 2020-02-10 DIAGNOSIS — E782 Mixed hyperlipidemia: Secondary | ICD-10-CM

## 2020-02-10 DIAGNOSIS — I255 Ischemic cardiomyopathy: Secondary | ICD-10-CM | POA: Diagnosis not present

## 2020-02-10 DIAGNOSIS — I2510 Atherosclerotic heart disease of native coronary artery without angina pectoris: Secondary | ICD-10-CM

## 2020-02-10 DIAGNOSIS — I493 Ventricular premature depolarization: Secondary | ICD-10-CM

## 2020-02-10 DIAGNOSIS — N182 Chronic kidney disease, stage 2 (mild): Secondary | ICD-10-CM

## 2020-02-10 DIAGNOSIS — I5042 Chronic combined systolic (congestive) and diastolic (congestive) heart failure: Secondary | ICD-10-CM

## 2020-02-22 NOTE — Progress Notes (Signed)
Cardiology Office Note:    Date:  02/23/2020   ID:  [name] [name], DOB December 07, 1955, MRN [id number]  PCP:  Biagio Borg, MD  Cardiologist:  Sherren Mocha, MD / [name], PA-C  Electrophysiologist:  None   Referring MD: Biagio Borg, MD   Chief Complaint:  Follow-up (CAD, CHF, Palps)    Patient Profile:    [name] [name] is a 64 y.o. male with:   Coronary artery disease   S/p PCI to RCA and PL1  S/p cutting POBA to RCA 2/2 ISR in 5/08  S/p ant STEMI in 3/14 >> PCI: DES to LAD  S/p NSTEMI in 6/15 >> PCI: DES to [place]  S/p NSTEMI 10/15 >> Med Rx  Myoview 1/17: no ischemia  Cath [facility] 05/2019: RCA 100 CTO  HFrEF w/ return of normal LVF  Ischemic CM  Combined Systolic and Diastolic CHF  Echocardiogram 05/2019 ([facility]): EF 50-55  Hypertension   Hyperlipidemia   Chronic kidney disease  Diabetes mellitus   OSA   GERD  Tobacco abuse   Prior CV studies: Echocardiogram 01/14/20 EF 48, inf AK , post and inf HK, mod LVH, normal RVSF, RVSP 25.3, mild to mod LAE, trivial MR,   Cardiac catheterization 05/14/2019 ([facility]) LM normal LAD diff irregs LCx diffuse irregs RCA mid 100 (CTO)  Echocardiogram 05/13/2019 ([facility]) Mod conc LVH, inf AK and likely post HK, EF 50-55  Echocardiogram 05/31/17 Severe conc LVH, EF 55-60, no RWMA, Gr 1 DD, PASP 47  Echo 07/07/15 EF 45-50%, inferior HK, mild BAE, PASP 33 mmHg  Myoview 06/16/15 Intermediate risk stress nuclear study with a large, severe, predominantly fixed inferior lateral defect consistent with prior infarct; no significant ischemia; EF 35 but visually appears better; akinesis of the basal and mid inferior lateral wall; mild LVE; suggest echo to better assess LV function. Study intermediate risk due to reduced LV function.  Echo 9/21/6 Inf HK, mild LVH, EF 55%, mild LAE, normal RVF, mild RAE, PASP 35 mmHg  LHC (10/15):  Proximal LAD ectatic, mid LAD stent patent, distal LAD 70% at the apex, proximal and mid  circumflex diffuse ectasia, small OM1 with CTO, OM2 stent patent, distal OM2 90%, OM3 30-40%, very distal circumflex 90%, proximal to mid RCA stent patent, mid RCA stent 60-70% ISR, EF 50%, inferobasal akinesis  Echo (6/15):  Mild LVH. EF 50% to 55%. Wall motion was normal. Grade 1 diastolic dysfunction. Left atrium: The atrium was mildly dilated. Right ventricle: The cavity size was moderately dilated. Right atrium: The atrium was mildly to moderately dilated.  Myoview 9/12:  mod scar in base/mid inf and IL segments, slight peri-infarct ischemia - low risk (med Rx continued).  Carotid US (4/13):  Bilateral: no ICA stenosis   History of Present Illness:    Mr. Yearwood was last seen in 12/2019.  He was having symptoms of atypical chest pain and was noted to have a lot of PVCs on his ECG.  I increased his beta-blocker.  His K+ was low and this was replaced.  He underwent an echocardiogram which demonstrated low normal EF.  I also had him wear a 3 day Zio monitor.  The report has not been read yet but I did review his monitor.  This showed severe episodes of SVT with the highest HR > 200.  He also had 3 short NSVT episodes and 6.1% PVCs.   He returns for follow up. He is here alone. Since last seen, he is doing fairly well.  He is concerned about his weight and wants to change his diet.  He was to take 3 tabs of K+ twice daily but is only taking 2 tabs twice daily, by mistake.  He has noted less chest discomfort and palpitations since starting on the higher dose beta-blocker.  He gets short of breath with some activities.  He has not had orthopnea, leg swelling or syncope.    Past Medical History:  Diagnosis Date  . CAD (coronary artery disease)    a. s/p BMS pRCA 2002; b. DES to pRCA & DES p/m RCA 2006; c. PCI/DES OM4 2007; d. PCI/CBA to RCA for ISR 10/2006; e.08/2012 STEMI/Cath/PCI:  LAD 95% >> PCI: Promus Prem DES  //  f. NSTEMI 10/15 >> LHC: mLAD stent ok, dLAD 70, OM1 CTO, OM2 stent ok, dOM2  90, OM3 30-40, dLCx 90, p-mRCA stent ok, mRCA stent ok w/ 60-70 ISR, EF 50% >> med Rx // MV 1/17: no ischemia // Cath ([facility]) 05/2019: RCA 100 (CTO)   . Combined systolic and diastolic CHF    a. Myoview 1/17: EF 35%  //  b. Echo 1/17 EF 45-50% // Echo 05/2019: EF 50-55 // Echocardiogram 8/21: EF 50, inf AK, post and mid inf HK, mod LVH, RVSP 25.3, mild to mod LAE, trivial MR   . Depression   . Diabetes mellitus ([place])    a. A1c 8.8 08/2012->Metformin initiated. => b. A1c (9/14): 6.6  . Gastroesophageal reflux disease   . History of nuclear stress test    a. Myoview 1/17: EF 35%, fixed inferior lateral defect consistent with infarct, no ischemia, intermediate risk  . History of pneumonia   . HTN (hypertension)   . Hx of NSTEMI   . Hyperlipidemia   . Ischemic cardiomyopathy    a. EF 40%; improved to normal;  b. 08/2012 Echo: EF 50-55%, mod LVH.//  c. Echo 9/16: Inf HK, mild LVH, EF 55%, mild LAE, normal RVF, mild RAE, PASP 35 mmHg  //  d. Echo 1/17: EF 45-50%, inferior HK, mild BAE, PASP 33 mmHg  . Obesity   . OSA (obstructive sleep apnea)    Does not use CPAP as of 05/2011  . Tobacco abuse     Current Medications: Current Meds  Medication Sig  . albuterol (VENTOLIN HFA) 108 (90 Base) MCG/ACT inhaler Inhale 2 puffs into the lungs every 6 (six) hours as needed for wheezing or shortness of breath.  . Alcohol Swabs (B-D SINGLE USE SWABS BUTTERFLY) PADS Use up to 4 times a day to test blood sugars. Dx: E10.9, E11.9  . aspirin 81 MG tablet Take 81 mg by mouth daily.  . Blood Glucose Calibration (TRUE METRIX LEVEL 1) Low SOLN Use as needed. Dx: E10.9, E11.9  . blood glucose meter kit and supplies Dispense based on patient and insurance preference. Use up to four times daily as directed. (FOR ICD-10 E10.9, E11.9).  . carvedilol (COREG) 25 MG tablet Take 1 tablet (25 mg total) by mouth 2 (two) times daily with a meal.  . cetirizine (ZYRTEC) 10 MG tablet 1 tab by mouth daily  . clopidogrel (PLAVIX) 75  MG tablet Take 1 tablet (75 mg total) by mouth daily.  . furosemide (LASIX) 40 MG tablet Take 40 mg by mouth 2 (two) times daily.  [name] gabapentin (NEURONTIN) 300 MG capsule Take 1 capsule (300 mg total) by mouth 3 (three) times daily.  [name] glipiZIDE (GLUCOTROL XL) 2.5 MG 24 hr tablet Take 1 tablet (2.5 mg total) by  mouth daily.  [name] glucose blood (TRUE METRIX BLOOD GLUCOSE TEST) test strip Use to test blood sugars up to 4 times a day. DX: E10.9, E.11.9  . isosorbide mononitrate (IMDUR) 30 MG 24 hr tablet Take 3 tablets (90 mg total) by mouth daily.  . Lancets 33G MISC Use to test blood sugars up to 4 times a day. DX: E10.9, E11.9  . nitroGLYCERIN (NITROSTAT) 0.4 MG SL tablet PLACE 1 TABLET UNDER THE TONGUE EVERY 5 MINUTES AS NEEDED FOR CHEST PAIN  . pantoprazole (PROTONIX) 40 MG tablet Take 1 tablet (40 mg total) by mouth daily.  . potassium chloride (KLOR-CON) 10 MEQ tablet Take 2 tablets (20 mEq total) by mouth 2 (two) times daily.  . rosuvastatin (CRESTOR) 40 MG tablet Take 1 tablet (40 mg total) by mouth daily.  . traMADol (ULTRAM) 50 MG tablet Take 50 mg by mouth as needed for moderate pain.  . vitamin B-12 (CYANOCOBALAMIN) 1000 MCG tablet Take 1 tablet (1,000 mcg total) by mouth daily.  . Vitamin D, Ergocalciferol, (DRISDOL) 1.25 MG (50000 UNIT) CAPS capsule Take 1 capsule (50,000 Units total) by mouth every 7 (seven) days.  . [DISCONTINUED] carvedilol (COREG) 12.5 MG tablet Take 1.5 tablets (18.75 mg total) by mouth 2 (two) times daily with a meal.  . [DISCONTINUED] potassium chloride (KLOR-CON) 10 MEQ tablet Take 3 tablets (30 mEq total) by mouth 2 (two) times daily.     Allergies:   Penicillin g and Penicillins   Social History   Tobacco Use  . Smoking status: Former Smoker    Packs/day: 0.50    Years: 30.00    Pack years: 15.00    Types: Cigarettes  . Smokeless tobacco: Never Used  . Tobacco comment: trying to cut down  Vaping Use  . Vaping Use: Never used  Substance Use Topics    . Alcohol use: No  . Drug use: No    Comment: remote marijuana use     Family Hx: The patient's family history includes Cancer in his mother; Coronary artery disease in an other family member; Depression in his mother; Hypertension in his mother; Other in his father. There is no history of Heart attack or Stroke.  ROS  See HPI  EKGs/Labs/Other Test Reviewed:    EKG:  EKG is not ordered today.  The ekg ordered today demonstrates n/a  Recent Labs: 08/25/2019: B Natriuretic Peptide 119.8 11/20/2019: ALT 14; Hemoglobin 15.2; Platelets 181.0; Pro B Natriuretic peptide (BNP) 334.0; TSH 1.44 12/29/2019: Magnesium 2.0 01/07/2020: BUN 17; Creatinine, Ser 1.56; Potassium 3.5; Sodium 143   Recent Lipid Panel Lab Results  Component Value Date/Time   CHOL 230 (H) 11/20/2019 12:38 PM   TRIG 55.0 11/20/2019 12:38 PM   HDL 49.90 11/20/2019 12:38 PM   CHOLHDL 5 11/20/2019 12:38 PM   LDLCALC 169 (H) 11/20/2019 12:38 PM    Physical Exam:    VS:  BP (!) 140/92   Pulse 62   Ht _0  (1.854 m)   Wt (!) 329 lb (149.2 kg)   SpO2 95%   BMI 43.41 kg/m     Wt Readings from Last 3 Encounters:  02/23/20 (!) 329 lb (149.2 kg)  12/29/19 (!) 325 lb (147.4 kg)  11/20/19 (!) 336 lb (152.4 kg)     Constitutional:      Appearance: Healthy appearance. Not in distress.  Pulmonary:     Effort: Pulmonary effort is normal.     Breath sounds: No wheezing. No rales.  Cardiovascular:  Normal rate. Regular rhythm. Normal S1. Normal S2.     Murmurs: There is no murmur.  Edema:    Peripheral edema absent.  Abdominal:     General: There is no distension.     Palpations: Abdomen is soft.  Musculoskeletal:     Cervical back: Neck supple. Skin:    General: Skin is warm and dry.  Neurological:     General: No focal deficit present.     Mental Status: Alert and oriented to person, place and time.     Cranial Nerves: Cranial nerves are intact.      ASSESSMENT & PLAN:    1. SVT (supraventricular  tachycardia) ([place]) He had several episodes of SVT with HRs as high as 231.  The longest SVT was 17 seconds.  He has had less palpitations since I increased his beta-blocker.  I reviewed his case with Dr. Quentin Ore (EP).  The episodes of SVT are not that long.  Therefore, there may not be an option for ablation, etc.  I will increase his Carvedilol to 25 mg twice daily.  If he continues to have symptoms with this, I will refer him to EP.    2. PVC's (premature ventricular contractions) 3. Hypokalemia He had ~6% PVCs on his monitor and also had a few brief episodes of NSVT.  His EF is 50% by recent echocardiogram.  Continue beta-blocker.  Continue current dose of K+.  I will add Spironolactone 12.5 mg once daily to get his K+ to 4.  Obtain BMET Q week x 2.    4. Chronic combined systolic and diastolic CHF  Primarily diastolic CHF.  Recent echocardiogram with EF 50%.  Continue current dose of Carvedilol.  Add spironolactone.    5. Coronary artery disease involving native coronary artery of native heart without angina pectoris Status post multiple PCI procedures in the setting of ACS and ST elevation myocardial infarctions.  His most recent cardiac catheterization was in the setting of decompensated heart failure.  This was done in [facility]  The study demonstrated a chronically occluded RCA.  There was no significant disease in the LAD and LCx.  No anginal symptoms.  Continue aspirin, clopidogrel, carvedilol, isosorbide mononitrate, rosuvastatin.  6. Essential hypertension Blood pressure above goal.  Add spironolactone and increase carvedilol as noted.  7. Mixed hyperlipidemia Continue high intensity statin therapy.  8. CKD (chronic kidney disease), stage II Recent creatinine stable.  Obtain BMET weekly x2 after starting spironolactone.    Dispo:  Return in about 3 months (around 05/24/2020) for Routine Follow Up, w/ Dr. [name], or [name], PA-C, in person.   Medication Adjustments/Labs  and Tests Ordered: Current medicines are reviewed at length with the patient today.  Concerns regarding medicines are outlined above.  Tests Ordered: Orders Placed This Encounter  Procedures  . Basic metabolic panel  . Basic metabolic panel   Medication Changes: Meds ordered this encounter  Medications  . potassium chloride (KLOR-CON) 10 MEQ tablet    Sig: Take 2 tablets (20 mEq total) by mouth 2 (two) times daily.    Dispense:  360 tablet    Refill:  3  . spironolactone (ALDACTONE) 25 MG tablet    Sig: Take 0.5 tablets (12.5 mg total) by mouth daily.    Dispense:  45 tablet    Refill:  3  . carvedilol (COREG) 25 MG tablet    Sig: Take 1 tablet (25 mg total) by mouth 2 (two) times daily with a meal.  Dispense:  180 tablet    Refill:  3    Signed, [name], PA-C  02/23/2020 9:01 AM    [address], [place], [place]  [address] Phone: [phone]; Fax: [phone]

## 2020-02-23 ENCOUNTER — Other Ambulatory Visit: Payer: Self-pay

## 2020-02-23 ENCOUNTER — Ambulatory Visit: Payer: Medicare HMO | Admitting: Internal Medicine

## 2020-02-23 ENCOUNTER — Encounter: Payer: Self-pay | Admitting: Physician Assistant

## 2020-02-23 ENCOUNTER — Ambulatory Visit (INDEPENDENT_AMBULATORY_CARE_PROVIDER_SITE_OTHER): Payer: Medicare HMO | Admitting: Physician Assistant

## 2020-02-23 VITALS — BP 140/92 | HR 62 | Ht 73.0 in | Wt 329.0 lb

## 2020-02-23 DIAGNOSIS — I2510 Atherosclerotic heart disease of native coronary artery without angina pectoris: Secondary | ICD-10-CM

## 2020-02-23 DIAGNOSIS — E876 Hypokalemia: Secondary | ICD-10-CM

## 2020-02-23 DIAGNOSIS — I471 Supraventricular tachycardia: Secondary | ICD-10-CM

## 2020-02-23 DIAGNOSIS — N182 Chronic kidney disease, stage 2 (mild): Secondary | ICD-10-CM

## 2020-02-23 DIAGNOSIS — I10 Essential (primary) hypertension: Secondary | ICD-10-CM | POA: Diagnosis not present

## 2020-02-23 DIAGNOSIS — I493 Ventricular premature depolarization: Secondary | ICD-10-CM

## 2020-02-23 DIAGNOSIS — E782 Mixed hyperlipidemia: Secondary | ICD-10-CM | POA: Diagnosis not present

## 2020-02-23 DIAGNOSIS — I5042 Chronic combined systolic (congestive) and diastolic (congestive) heart failure: Secondary | ICD-10-CM

## 2020-02-23 MED ORDER — POTASSIUM CHLORIDE ER 10 MEQ PO TBCR: 20.0000 meq | EXTENDED_RELEASE_TABLET | Freq: Two times a day (BID) | ORAL | 3 refills | Status: DC

## 2020-02-23 MED ORDER — SPIRONOLACTONE 25 MG PO TABS: 12.5000 mg | ORAL_TABLET | Freq: Every day | ORAL | 3 refills | Status: DC

## 2020-02-23 MED ORDER — CARVEDILOL 25 MG PO TABS
25.0000 mg | ORAL_TABLET | Freq: Two times a day (BID) | ORAL | 3 refills | Status: DC
Start: 2020-02-23 — End: 2021-04-18

## 2020-02-23 NOTE — Patient Instructions (Addendum)
Medication Instructions:  Your physician has recommended you make the following change in your medication:   1) Start Spironolactone 25 mg, 0.5 tablet by mouth once a day 2) Keep Potassium at 2 tablets by mouth twice a day 3) Increase Coreg to 25 mg, 1 tablet by mouth twice a day  *If you need a refill on your cardiac medications before your next appointment, please call your pharmacy*  Lab Work: Your physician recommends that you return for lab work in 1 week on 03/01/20  Your physician recommends that you return for lab work in 2 weeks on 03/08/20; **The lab is open from 7:30AM-4:30PM** you can come anytime between these hours  If you have labs (blood work) drawn today and your tests are completely normal, you will receive your results only by: [name] MyChart Message (if you have MyChart) OR . A paper copy in the mail If you have any lab test that is abnormal or we need to change your treatment, we will call you to review the results.  Testing/Procedures: None ordered today  Follow-Up: On 05/27/20 at 8:15AM with [name], PA-C

## 2020-03-01 ENCOUNTER — Other Ambulatory Visit: Payer: Medicare HMO

## 2020-03-01 ENCOUNTER — Other Ambulatory Visit: Payer: Self-pay

## 2020-03-01 DIAGNOSIS — E782 Mixed hyperlipidemia: Secondary | ICD-10-CM | POA: Diagnosis not present

## 2020-03-01 DIAGNOSIS — I471 Supraventricular tachycardia, unspecified: Secondary | ICD-10-CM

## 2020-03-01 DIAGNOSIS — I5042 Chronic combined systolic (congestive) and diastolic (congestive) heart failure: Secondary | ICD-10-CM | POA: Diagnosis not present

## 2020-03-01 DIAGNOSIS — E876 Hypokalemia: Secondary | ICD-10-CM | POA: Diagnosis not present

## 2020-03-01 DIAGNOSIS — I2510 Atherosclerotic heart disease of native coronary artery without angina pectoris: Secondary | ICD-10-CM | POA: Diagnosis not present

## 2020-03-01 DIAGNOSIS — N182 Chronic kidney disease, stage 2 (mild): Secondary | ICD-10-CM | POA: Diagnosis not present

## 2020-03-01 DIAGNOSIS — I10 Essential (primary) hypertension: Secondary | ICD-10-CM | POA: Diagnosis not present

## 2020-03-01 DIAGNOSIS — I493 Ventricular premature depolarization: Secondary | ICD-10-CM | POA: Diagnosis not present

## 2020-03-01 LAB — BASIC METABOLIC PANEL
BUN/Creatinine Ratio: 9 — ABNORMAL LOW (ref 10–24)
BUN: 14 mg/dL (ref 8–27)
CO2: 25 mmol/L (ref 20–29)
Calcium: 9.3 mg/dL (ref 8.6–10.2)
Chloride: 106 mmol/L (ref 96–106)
Creatinine, Ser: 1.57 mg/dL — ABNORMAL HIGH (ref 0.76–1.27)
GFR calc Af Amer: 53 mL/min/{1.73_m2} — ABNORMAL LOW (ref >59)
GFR calc non Af Amer: 46 mL/min/{1.73_m2} — ABNORMAL LOW (ref >59)
Glucose: 150 mg/dL — ABNORMAL HIGH (ref 65–99)
Potassium: 5.1 mmol/L (ref 3.5–5.2)
Sodium: 141 mmol/L (ref 134–144)

## 2020-03-08 ENCOUNTER — Other Ambulatory Visit: Payer: Medicare HMO

## 2020-03-11 ENCOUNTER — Ambulatory Visit: Payer: Medicare HMO | Admitting: Internal Medicine

## 2020-03-22 ENCOUNTER — Ambulatory Visit (INDEPENDENT_AMBULATORY_CARE_PROVIDER_SITE_OTHER): Payer: Medicare HMO | Admitting: Internal Medicine

## 2020-03-22 ENCOUNTER — Encounter: Payer: Self-pay | Admitting: Internal Medicine

## 2020-03-22 ENCOUNTER — Other Ambulatory Visit: Payer: Self-pay

## 2020-03-22 VITALS — BP 138/90 | HR 54 | Temp 98.8°F | Ht 73.0 in | Wt 324.0 lb

## 2020-03-22 DIAGNOSIS — Z23 Encounter for immunization: Secondary | ICD-10-CM

## 2020-03-22 DIAGNOSIS — N183 Chronic kidney disease, stage 3 unspecified: Secondary | ICD-10-CM | POA: Diagnosis not present

## 2020-03-22 DIAGNOSIS — E559 Vitamin D deficiency, unspecified: Secondary | ICD-10-CM

## 2020-03-22 DIAGNOSIS — E1159 Type 2 diabetes mellitus with other circulatory complications: Secondary | ICD-10-CM | POA: Diagnosis not present

## 2020-03-22 DIAGNOSIS — I10 Essential (primary) hypertension: Secondary | ICD-10-CM

## 2020-03-22 DIAGNOSIS — E538 Deficiency of other specified B group vitamins: Secondary | ICD-10-CM

## 2020-03-22 DIAGNOSIS — E782 Mixed hyperlipidemia: Secondary | ICD-10-CM | POA: Diagnosis not present

## 2020-03-22 LAB — POCT GLYCOSYLATED HEMOGLOBIN (HGB A1C): Hemoglobin A1C: 6.8 % — AB (ref 4.0–5.6)

## 2020-03-22 NOTE — Progress Notes (Signed)
Subjective:    Patient ID: [name] [name], male    DOB: 08-10-1955, 64 y.o.   MRN: [id number]  HPI Here to f/u; overall doing ok,  Pt denies chest pain, increasing sob or doe, wheezing, orthopnea, PND, increased LE swelling, palpitations, dizziness or syncope.  Pt denies new neurological symptoms such as new headache, or facial or extremity weakness or numbness.  Pt denies polydipsia, polyuria, or low sugar episode.  Pt states overall good compliance with meds, mostly trying to follow appropriate diet, with wt overall stable,  but little exercise however.  Has stopped taking the b12 and vit d  No new complaints Past Medical History:  Diagnosis Date  . CAD (coronary artery disease)    a. s/p BMS pRCA 2002; b. DES to pRCA & DES p/m RCA 2006; c. PCI/DES OM4 2007; d. PCI/CBA to RCA for ISR 10/2006; e.08/2012 STEMI/Cath/PCI:  LAD 95% >> PCI: Promus Prem DES  //  f. NSTEMI 10/15 >> LHC: mLAD stent ok, dLAD 70, OM1 CTO, OM2 stent ok, dOM2 90, OM3 30-40, dLCx 90, p-mRCA stent ok, mRCA stent ok w/ 60-70 ISR, EF 50% >> med Rx // MV 1/17: no ischemia // Cath ([facility]) 05/2019: RCA 100 (CTO)   . Combined systolic and diastolic CHF    a. Myoview 1/17: EF 35%  //  b. Echo 1/17 EF 45-50% // Echo 05/2019: EF 50-55 // Echocardiogram 8/21: EF 50, inf AK, post and mid inf HK, mod LVH, RVSP 25.3, mild to mod LAE, trivial MR   . Depression   . Diabetes mellitus ([place])    a. A1c 8.8 08/2012->Metformin initiated. => b. A1c (9/14): 6.6  . Gastroesophageal reflux disease   . History of nuclear stress test    a. Myoview 1/17: EF 35%, fixed inferior lateral defect consistent with infarct, no ischemia, intermediate risk  . History of pneumonia   . HTN (hypertension)   . Hx of NSTEMI   . Hyperlipidemia   . Ischemic cardiomyopathy    a. EF 40%; improved to normal;  b. 08/2012 Echo: EF 50-55%, mod LVH.//  c. Echo 9/16: Inf HK, mild LVH, EF 55%, mild LAE, normal RVF, mild RAE, PASP 35 mmHg  //  d. Echo 1/17: EF 45-50%, inferior HK,  mild BAE, PASP 33 mmHg  . Obesity   . OSA (obstructive sleep apnea)    Does not use CPAP as of 05/2011  . Tobacco abuse    Past Surgical History:  Procedure Laterality Date  . CORONARY ANGIOPLASTY WITH STENT PLACEMENT    . CORONARY STENT PLACEMENT  2009  . LEFT HEART CATHETERIZATION WITH CORONARY ANGIOGRAM N/A 08/31/2012   Procedure: LEFT HEART CATHETERIZATION WITH CORONARY ANGIOGRAM;  Surgeon: Burnell Blanks, MD;  Location[facility] CATH LAB;  Service: Cardiovascular;  Laterality: N/A;  . LEFT HEART CATHETERIZATION WITH CORONARY ANGIOGRAM N/A 11/16/2013   Procedure: LEFT HEART CATHETERIZATION WITH CORONARY ANGIOGRAM;  Surgeon: Blane Ohara, MD;  Location: [facility] CATH LAB;  Service: Cardiovascular;  Laterality: N/A;  . LEFT HEART CATHETERIZATION WITH CORONARY ANGIOGRAM N/A 03/15/2014   Procedure: LEFT HEART CATHETERIZATION WITH CORONARY ANGIOGRAM;  Surgeon: Peter M Martinique, MD;  Location: [facility] CATH LAB;  Service: Cardiovascular;  Laterality: N/A;  . PERCUTANEOUS CORONARY STENT INTERVENTION (PCI-S) Right 08/31/2012   Procedure: PERCUTANEOUS CORONARY STENT INTERVENTION (PCI-S);  Surgeon: Burnell Blanks, MD;  Location: [facility] CATH LAB;  Service: Cardiovascular;  Laterality: Right;  . PERCUTANEOUS CORONARY STENT INTERVENTION (PCI-S)  11/16/2013   Procedure: PERCUTANEOUS CORONARY STENT  INTERVENTION (PCI-S);  Surgeon: Blane Ohara, MD;  Location: [facility] CATH LAB;  Service: Cardiovascular;;    reports that he has quit smoking. His smoking use included cigarettes. He has a 15.00 pack-year smoking history. He has never used smokeless tobacco. He reports that he does not drink alcohol and does not use drugs. family history includes Cancer in his mother; Coronary artery disease in an other family member; Depression in his mother; Hypertension in his mother; Other in his father. Allergies  Allergen Reactions  . Penicillin G Other (See Comments)    Did it involve swelling of the face/tongue/throat, SOB, or  low BP?  N/A Did it involve sudden or severe rash/hives, skin peeling, or any reaction on the inside of your mouth or nose? N/A Did you need to seek medical attention at a hospital or doctor's office? N/A When did it last happen?Child If all above answers are "NO", may proceed with cephalosporin use.  [name] Penicillins Other (See Comments)    Did it involve swelling of the face/tongue/throat, SOB, or low BP? N/A Did it involve sudden or severe rash/hives, skin peeling, or any reaction on the inside of your mouth or nose?N/A Did you need to seek medical attention at a hospital or doctor's office? N/A When did it last happen?Child If all above answers are "NO", may proceed with cephalosporin use.   Current Outpatient Medications on File Prior to Visit  Medication Sig Dispense Refill  . albuterol (VENTOLIN HFA) 108 (90 Base) MCG/ACT inhaler Inhale 2 puffs into the lungs every 6 (six) hours as needed for wheezing or shortness of breath. 54 g 3  . Alcohol Swabs (B-D SINGLE USE SWABS BUTTERFLY) PADS Use up to 4 times a day to test blood sugars. Dx: E10.9, E11.9 100 each 11  . aspirin 81 MG tablet Take 81 mg by mouth daily.    . Blood Glucose Calibration (TRUE METRIX LEVEL 1) Low SOLN Use as needed. Dx: E10.9, E11.9 1 each 0  . blood glucose meter kit and supplies Dispense based on patient and insurance preference. Use up to four times daily as directed. (FOR ICD-10 E10.9, E11.9). 1 each 0  . carvedilol (COREG) 25 MG tablet Take 1 tablet (25 mg total) by mouth 2 (two) times daily with a meal. 180 tablet 3  . cetirizine (ZYRTEC) 10 MG tablet 1 tab by mouth daily 90 tablet 3  . clopidogrel (PLAVIX) 75 MG tablet Take 1 tablet (75 mg total) by mouth daily. 90 tablet 3  . furosemide (LASIX) 40 MG tablet Take 40 mg by mouth 2 (two) times daily.    [name] gabapentin (NEURONTIN) 300 MG capsule Take 1 capsule (300 mg total) by mouth 3 (three) times daily. 270 capsule 1  . glipiZIDE (GLUCOTROL XL) 2.5 MG 24 hr  tablet Take 1 tablet (2.5 mg total) by mouth daily. 90 tablet 3  . glucose blood (TRUE METRIX BLOOD GLUCOSE TEST) test strip Use to test blood sugars up to 4 times a day. DX: E10.9, E.11.9 100 each 12  . isosorbide mononitrate (IMDUR) 30 MG 24 hr tablet Take 3 tablets (90 mg total) by mouth daily. 90 tablet 3  . Lancets 33G MISC Use to test blood sugars up to 4 times a day. DX: E10.9, E11.9 100 each 6  . nitroGLYCERIN (NITROSTAT) 0.4 MG SL tablet PLACE 1 TABLET UNDER THE TONGUE EVERY 5 MINUTES AS NEEDED FOR CHEST PAIN 25 tablet 5  . pantoprazole (PROTONIX) 40 MG tablet Take 1 tablet (40  mg total) by mouth daily. 90 tablet 3  . potassium chloride (KLOR-CON) 10 MEQ tablet Take 2 tablets (20 mEq total) by mouth 2 (two) times daily. 360 tablet 3  . rosuvastatin (CRESTOR) 40 MG tablet Take 1 tablet (40 mg total) by mouth daily. 90 tablet 1  . spironolactone (ALDACTONE) 25 MG tablet Take 0.5 tablets (12.5 mg total) by mouth daily. 45 tablet 3  . torsemide (DEMADEX) 20 MG tablet     . traMADol (ULTRAM) 50 MG tablet Take 50 mg by mouth as needed for moderate pain.    . vitamin B-12 (CYANOCOBALAMIN) 1000 MCG tablet Take 1 tablet (1,000 mcg total) by mouth daily. 90 tablet 3   No current facility-administered medications on file prior to visit.   Review of Systems All otherwise neg per pt    Objective:   Physical Exam BP 138/90 (BP Location: Left Arm, Patient Position: Sitting, Cuff Size: Large)   Pulse (!) 54   Temp 98.8 F (37.1 C) (Oral)   Ht _0  (1.854 m)   Wt (!) 324 lb (147 kg)   SpO2 98%   BMI 42.75 kg/m  VS noted,  Constitutional: Pt appears in NAD HENT: Head: NCAT.  Right Ear: External ear normal.  Left Ear: External ear normal.  Eyes: . Pupils are equal, round, and reactive to light. Conjunctivae and EOM are normal Nose: without d/c or deformity Neck: Neck supple. Gross normal ROM Cardiovascular: Normal rate and regular rhythm.   Pulmonary/Chest: Effort normal and breath  sounds without rales or wheezing.  Abd:  Soft, NT, ND, + BS, no organomegaly Neurological: Pt is alert. At baseline orientation, motor grossly intact Skin: Skin is warm. No rashes, other new lesions, no LE edema Psychiatric: Pt behavior is normal without agitation  All otherwise neg per pt  Lab Results  Component Value Date   WBC 6.6 11/20/2019   HGB 15.2 11/20/2019   HCT 47.1 11/20/2019   PLT 181.0 11/20/2019   GLUCOSE 150 (H) 03/01/2020   CHOL 230 (H) 11/20/2019   TRIG 55.0 11/20/2019   HDL 49.90 11/20/2019   LDLCALC 169 (H) 11/20/2019   ALT 14 11/20/2019   AST 16 11/20/2019   NA 141 03/01/2020   K 5.1 03/01/2020   CL 106 03/01/2020   CREATININE 1.57 (H) 03/01/2020   BUN 14 03/01/2020   CO2 25 03/01/2020   TSH 1.44 11/20/2019   PSA 1.49 11/20/2019   INR 1.00 05/29/2017   HGBA1C 6.8 (A) 03/22/2020   MICROALBUR 49.0 (H) 05/14/2017    Contains abnormal dataPOCT glycosylated hemoglobin (Hb A1C) Order: [id number] Status:  Final result Visible to patient:  Yes (not seen) Dx:  Type 2 diabetes mellitus with other c...  0 Result Notes   1 HM Topic  Ref Range & Units 13:31  (03/22/20) 4 mo ago  (11/20/19) 1 yr ago  (12/24/18) 1 yr ago  (05/12/18) 2 yr ago  (05/14/17) 4 yr ago  (01/04/16) 4 yr ago  (12/15/15)  Hemoglobin A1C 4.0 - 5.6 % 6.8Abnormal  7.3High R, CM  7.1High R, CM  6.9High R, CM  6.3 R, CM  6.4 R, CM  6.0High           Assessment & Plan:

## 2020-03-22 NOTE — Patient Instructions (Addendum)
You had the flu shot today  Your A1c was OK today  Please continue all other medications as before, and refills have been done if requested.  Please have the pharmacy call with any other refills you may need.  Please continue your efforts at being more active, low cholesterol diet, and weight control.  Please keep your appointments with your specialists as you may have planned  Please make an Appointment to return in 6 months, or sooner if needed

## 2020-04-01 ENCOUNTER — Ambulatory Visit: Payer: Medicaid Other | Admitting: Podiatry

## 2020-04-03 ENCOUNTER — Encounter: Payer: Self-pay | Admitting: Internal Medicine

## 2020-04-03 DIAGNOSIS — E538 Deficiency of other specified B group vitamins: Secondary | ICD-10-CM | POA: Insufficient documentation

## 2020-04-03 DIAGNOSIS — E559 Vitamin D deficiency, unspecified: Secondary | ICD-10-CM | POA: Insufficient documentation

## 2020-04-03 NOTE — Assessment & Plan Note (Signed)
To restart b12 1000 mcg qd

## 2020-04-03 NOTE — Assessment & Plan Note (Signed)
To restart vit d 2000 qd

## 2020-04-03 NOTE — Assessment & Plan Note (Addendum)
stable overall by history and exam, recent data reviewed with pt, and pt to continue medical treatment as before,  to f/u any worsening symptoms or concerns  I spent 31 minutes in preparing to see the patient by review of recent labs, imaging and procedures, obtaining and reviewing separately obtained history, communicating with the patient and family or caregiver, ordering medications, tests or procedures, and documenting clinical information in the EHR including the differential Dx, treatment, and any further evaluation and other management of dm, b12 and d deficiency, htn, hld, ckd

## 2020-04-03 NOTE — Assessment & Plan Note (Signed)
stable overall by history and exam, recent data reviewed with pt, and pt to continue medical treatment as before,  to f/u any worsening symptoms or concerns, to continue statin

## 2020-04-03 NOTE — Assessment & Plan Note (Signed)
stable overall by history and exam, recent data reviewed with pt, and pt to continue medical treatment as before,  to f/u any worsening symptoms or concerns  

## 2020-04-27 ENCOUNTER — Ambulatory Visit (INDEPENDENT_AMBULATORY_CARE_PROVIDER_SITE_OTHER): Payer: Medicare HMO | Admitting: Podiatry

## 2020-04-27 ENCOUNTER — Other Ambulatory Visit: Payer: Self-pay

## 2020-04-27 ENCOUNTER — Encounter: Payer: Self-pay | Admitting: Podiatry

## 2020-04-27 DIAGNOSIS — B351 Tinea unguium: Secondary | ICD-10-CM

## 2020-04-27 DIAGNOSIS — M79674 Pain in right toe(s): Secondary | ICD-10-CM | POA: Diagnosis not present

## 2020-04-27 DIAGNOSIS — M2040 Other hammer toe(s) (acquired), unspecified foot: Secondary | ICD-10-CM

## 2020-04-27 DIAGNOSIS — N179 Acute kidney failure, unspecified: Secondary | ICD-10-CM | POA: Diagnosis not present

## 2020-04-27 DIAGNOSIS — M79675 Pain in left toe(s): Secondary | ICD-10-CM

## 2020-04-27 DIAGNOSIS — L84 Corns and callosities: Secondary | ICD-10-CM | POA: Diagnosis not present

## 2020-04-27 DIAGNOSIS — E1142 Type 2 diabetes mellitus with diabetic polyneuropathy: Secondary | ICD-10-CM

## 2020-04-27 DIAGNOSIS — L989 Disorder of the skin and subcutaneous tissue, unspecified: Secondary | ICD-10-CM | POA: Diagnosis not present

## 2020-04-27 NOTE — Progress Notes (Signed)
This patient returns to my office for at risk foot care.  This patient requires this care by a professional since this patient will be at risk due to having pre-ulcerative calluses, CKD and diabetic neuropathy and coagulation defect.  Patient is taking plavix.  This patient is unable to cut nails himself since the patient cannot reach his nails.These nails are painful walking and wearing shoes.  This patient presents for at risk foot care today.  Patient has not been seen in over 10 months.  General Appearance  Alert, conversant and in no acute stress.  Vascular  Dorsalis pedis and posterior tibial  pulses are palpable  bilaterally.  Capillary return is within normal limits  bilaterally. Temperature is within normal limits  bilaterally.  Neurologic  Senn-Weinstein monofilament wire test diminished   bilaterally. Muscle power within normal limits bilaterally.  Nails Thick disfigured discolored nails with subungual debris  from hallux to fifth toes bilaterally. No evidence of bacterial infection or drainage bilaterally.  Orthopedic  No limitations of motion  feet .  No crepitus or effusions noted.  No bony pathology or digital deformities noted.  Plantar flexed second metatarsal  B/L.  Skin  normotropic skin  noted bilaterally.  No signs of infections or ulcers noted.   Pre-ulcerous callus sub 2  B/L.  Onychomycosis  Pain in right toes  Pain in left toes  Porokeratosis/Pre-ulcerous callus  B/L.  Consent was obtained for treatment procedures.   Mechanical debridement of nails 1-5  bilaterally performed with a nail nipper.  Filed with dremel without incident. Porokeratosis debrided with # 15 blade.    Return office visit     3 months                Told patient to return for periodic foot care and evaluation due to potential at risk complications.   Gardiner Barefoot DPM

## 2020-05-26 NOTE — Progress Notes (Signed)
Cardiology Office Note:    Date:  05/27/2020   ID:  [name] [name], DOB 1956-03-31, MRN [id number]  PCP:  [name] Borg, MD  [facility] Cardiologist:  [name] Mocha, MD  [facility] Electrophysiologist:  None   Referring MD: [name] Borg, MD   Chief Complaint:  Follow-up (CAD, CHF, PVCs/SVT)    Patient Profile:    [name] [name] is a 64 y.o. male with:   Coronary artery disease  ? S/p PCI to RCA and PL1 ? S/p cutting POBA to RCA 2/2 ISR in 5/08 ? S/p ant STEMI in 3/14 >> PCI: DES to LAD ? S/p NSTEMI in 6/15 >> PCI: DES to OM1 ? S/p NSTEMI 10/15 >> Med Rx ? Myoview 1/17: no ischemia ? Cath at [facility] 05/2019: RCA 100 CTO  Heart failure with preserved ejection fraction  ? Ischemic CM ? Echocardiogram 05/2019 [facility]): EF 50-55 ? Echocardiogram 8/21: EF 50, inf AK  Supraventricular tachycardia  Monitor 9/21: Longest 17 seconds  PVCs (monitor 9/21: 6% burden)  Hypertension   Hyperlipidemia   Chronic kidney disease  Diabetes mellitus   OSA   GERD  Tobacco abuse   Prior CV studies: ZIO monitor 02/2020 1. The basic rhythm is normal sinus with an average HR of 59 bpm with baseline IVCD/bundle branch block 2. No atrial fibrillation or flutter 3. No high-grade heart block or pathologic pauses 4. There are frequent PVC's (6% burden) and rare, nonsustained episodes of idioventricular rhythm. There are rare ventricular runs, longest 6 beats 5. There are occasional supraventricular beats (3.7% burden) and rare supraventricular runs, longest lasting 17 seconds   Echocardiogram 01/14/20 EF 50, inf AK , post and inf HK, mod LVH, normal RVSF, RVSP 25.3, mild to mod LAE, trivial MR,   Cardiac catheterization 05/14/2019 ([facility]) LM normal LAD diff irregs LCx diffuse irregs RCA mid 100 (CTO)  Echocardiogram 05/13/2019 ([facility]) Mod conc LVH, inf AK and likely post HK, EF 50-55  Echocardiogram 05/31/17 Severe conc LVH, EF 55-60, no RWMA, Gr 1 DD, PASP 47  Echo  07/07/15 EF 45-50%, inferior HK, mild BAE, PASP 33 mmHg  Myoview 06/16/15 Intermediate risk stress nuclear study with a large, severe, predominantly fixed inferior lateral defect consistent with prior infarct; no significant ischemia; EF 35 but visually appears better; akinesis of the basal and mid inferior lateral wall; mild LVE; suggest echo to better assess LV function. Study intermediate risk due to reduced LV function.  Echo 9/21/6 Inf HK, mild LVH, EF 55%, mild LAE, normal RVF, mild RAE, PASP 35 mmHg  LHC (10/15):  Proximal LAD ectatic, mid LAD stent patent, distal LAD 70% at the apex, proximal and mid circumflex diffuse ectasia, small OM1 with CTO, OM2 stent patent, distal OM2 90%, OM3 30-40%, very distal circumflex 90%, proximal to mid RCA stent patent, mid RCA stent 60-70% ISR, EF 50%, inferobasal akinesis  Echo (6/15):  Mild LVH. EF 50% to 55%. Wall motion was normal. Grade 1 diastolic dysfunction. Left atrium: The atrium was mildly dilated. Right ventricle: The cavity size was moderately dilated. Right atrium: The atrium was mildly to moderately dilated.  Myoview 9/12:  mod scar in base/mid inf and IL segments, slight peri-infarct ischemia - low risk (med Rx continued).  Carotid US (4/13): Bilateral: no ICA stenosis   History of Present Illness:    [name] [name] was last seen in September 2021.  His beta-blocker was increased in the setting of brief episodes of supraventricular tachycardia as well as 6% burden PVCs on  his cardiac monitor.  I also placed him on spironolactone to prevent hypokalemia.  He returns for follow-up.  He is here alone.  Since last seen, has been doing well.  He has not had further palpitations.  He has not had chest discomfort, syncope, leg edema.  He sleeps on 2 pillows chronically.  He has fairly chronic shortness of breath with exertion.  This is unchanged.   Past Medical History:  Diagnosis Date  . CAD (coronary artery disease)    a. s/p BMS  pRCA 2002; b. DES to pRCA & DES p/m RCA 2006; c. PCI/DES OM4 2007; d. PCI/CBA to RCA for ISR 10/2006; e.08/2012 STEMI/Cath/PCI:  LAD 95% >> PCI: Promus Prem DES  //  f. NSTEMI 10/15 >> LHC: mLAD stent ok, dLAD 70, OM1 CTO, OM2 stent ok, dOM2 90, OM3 30-40, dLCx 90, p-mRCA stent ok, mRCA stent ok w/ 60-70 ISR, EF 50% >> med Rx // MV 1/17: no ischemia // Cath ([facility]) 05/2019: RCA 100 (CTO)   . Combined systolic and diastolic CHF    a. Myoview 1/17: EF 35%  //  b. Echo 1/17 EF 45-50% // Echo 05/2019: EF 50-55 // Echocardiogram 8/21: EF 50, inf AK, post and mid inf HK, mod LVH, RVSP 25.3, mild to mod LAE, trivial MR   . Depression   . Diabetes mellitus ([place])    a. A1c 8.8 08/2012->Metformin initiated. => b. A1c (9/14): 6.6  . Gastroesophageal reflux disease   . History of nuclear stress test    a. Myoview 1/17: EF 35%, fixed inferior lateral defect consistent with infarct, no ischemia, intermediate risk  . History of pneumonia   . HTN (hypertension)   . Hx of NSTEMI   . Hyperlipidemia   . Ischemic cardiomyopathy    a. EF 40%; improved to normal;  b. 08/2012 Echo: EF 50-55%, mod LVH.//  c. Echo 9/16: Inf HK, mild LVH, EF 55%, mild LAE, normal RVF, mild RAE, PASP 35 mmHg  //  d. Echo 1/17: EF 45-50%, inferior HK, mild BAE, PASP 33 mmHg  . Obesity   . OSA (obstructive sleep apnea)    Does not use CPAP as of 05/2011  . Tobacco abuse     Current Medications: Current Meds  Medication Sig  . Alcohol Swabs (B-D SINGLE USE SWABS BUTTERFLY) PADS Use up to 4 times a day to test blood sugars. Dx: E10.9, E11.9  . aspirin 81 MG tablet Take 81 mg by mouth daily.  . Blood Glucose Calibration (TRUE METRIX LEVEL 1) Low SOLN Use as needed. Dx: E10.9, E11.9  . blood glucose meter kit and supplies Dispense based on patient and insurance preference. Use up to four times daily as directed. (FOR ICD-10 E10.9, E11.9).  . carvedilol (COREG) 25 MG tablet Take 1 tablet (25 mg total) by mouth 2 (two) times daily with a meal.   . clopidogrel (PLAVIX) 75 MG tablet Take 1 tablet (75 mg total) by mouth daily.  [name] gabapentin (NEURONTIN) 300 MG capsule Take 1 capsule (300 mg total) by mouth 3 (three) times daily.  [name] glipiZIDE (GLUCOTROL XL) 2.5 MG 24 hr tablet Take 1 tablet (2.5 mg total) by mouth daily.  [name] glucose blood (TRUE METRIX BLOOD GLUCOSE TEST) test strip Use to test blood sugars up to 4 times a day. DX: E10.9, E.11.9  . isosorbide mononitrate (IMDUR) 30 MG 24 hr tablet Take 3 tablets (90 mg total) by mouth daily.  . Lancets 33G MISC Use to test blood sugars  up to 4 times a day. DX: E10.9, E11.9  . Multiple Vitamins-Minerals (VITAMIN D3 COMPLETE PO) Take 1 tablet by mouth daily.  . nitroGLYCERIN (NITROSTAT) 0.4 MG SL tablet PLACE 1 TABLET UNDER THE TONGUE EVERY 5 MINUTES AS NEEDED FOR CHEST PAIN  . pantoprazole (PROTONIX) 40 MG tablet Take 1 tablet (40 mg total) by mouth daily.  . rosuvastatin (CRESTOR) 40 MG tablet Take 1 tablet (40 mg total) by mouth daily.  [name] spironolactone (ALDACTONE) 25 MG tablet Take 0.5 tablets (12.5 mg total) by mouth daily.  [name] torsemide (DEMADEX) 20 MG tablet   . vitamin B-12 (CYANOCOBALAMIN) 1000 MCG tablet Take 1 tablet (1,000 mcg total) by mouth daily.  . [DISCONTINUED] potassium chloride (KLOR-CON) 10 MEQ tablet Take 2 tablets (20 mEq total) by mouth 2 (two) times daily.     Allergies:   Penicillin g and Penicillins   Social History   Tobacco Use  . Smoking status: Former Smoker    Packs/day: 0.50    Years: 30.00    Pack years: 15.00    Types: Cigarettes  . Smokeless tobacco: Never Used  . Tobacco comment: trying to cut down  Vaping Use  . Vaping Use: Never used  Substance Use Topics  . Alcohol use: No  . Drug use: No    Comment: remote marijuana use     Family Hx: The patient's family history includes Cancer in his mother; Coronary artery disease in an other family member; Depression in his mother; Hypertension in his mother; Other in his father. There is no history  of Heart attack or Stroke.  ROS   EKGs/Labs/Other Test Reviewed:    EKG:  EKG is   ordered today.  The ekg ordered today demonstrates normal sinus rhythm, heart rate 61, normal axis, inferior Q waves, RBBB, nonspecific ST-T wave changes, QTC 459  Recent Labs: 08/25/2019: B Natriuretic Peptide 119.8 11/20/2019: ALT 14; Hemoglobin 15.2; Platelets 181.0; Pro B Natriuretic peptide (BNP) 334.0; TSH 1.44 12/29/2019: Magnesium 2.0 03/01/2020: BUN 14; Creatinine, Ser 1.57; Potassium 5.1; Sodium 141   Recent Lipid Panel Lab Results  Component Value Date/Time   CHOL 230 (H) 11/20/2019 12:38 PM   TRIG 55.0 11/20/2019 12:38 PM   HDL 49.90 11/20/2019 12:38 PM   CHOLHDL 5 11/20/2019 12:38 PM   LDLCALC 169 (H) 11/20/2019 12:38 PM      Risk Assessment/Calculations:      Physical Exam:    VS:  BP (!) 152/80   Pulse 60   Ht $R'6\' 1"'eo$  (1.854 m)   Wt (!) 336 lb (152.4 kg)   BMI 44.33 kg/m     Wt Readings from Last 3 Encounters:  05/27/20 (!) 336 lb (152.4 kg)  03/22/20 (!) 324 lb (147 kg)  02/23/20 (!) 329 lb (149.2 kg)     Constitutional:      Appearance: Healthy appearance. Not in distress.  Neck:     Vascular: JVD normal.  Pulmonary:     Effort: Pulmonary effort is normal.     Breath sounds: No wheezing. No rales.  Cardiovascular:     Normal rate. Regular rhythm. Normal S1. Normal S2.     Murmurs: There is no murmur.  Edema:    Peripheral edema absent.  Abdominal:     Palpations: Abdomen is soft. There is no hepatomegaly.  Skin:    General: Skin is warm and dry.  Neurological:     General: No focal deficit present.     Mental Status: Alert and oriented to  person, place and time.     Cranial Nerves: Cranial nerves are intact.       ASSESSMENT & PLAN:    1. SVT (supraventricular tachycardia) ([place]) 2. PVC's (premature ventricular contractions) His palpitations have resolved.  At last visit, he was on carvedilol 18.75 mg twice daily.  I increased this to 25 mg twice daily.   Unfortunately, he is back to taking 12.5 mg twice daily.  His blood pressure is uncontrolled.  Increase carvedilol back to 25 mg twice daily.  3. Coronary artery disease involving native coronary artery of native heart without angina pectoris Status post multiple PCI procedures in the setting of ACS and ST elevation myocardial infarctions.   Cardiac catheterization December 2020 with chronically occluded RCA and no significant obstructive disease in the LCx or LAD.  He is doing well without anginal symptoms.  Continue aspirin, clopidogrel, carvedilol, isosorbide, rosuvastatin.  4. Chronic heart failure with preserved ejection fraction (HCC) EF 50% by echocardiogram August 2021.  Overall, volume status is stable.  Continue current dose of torsemide, spironolactone.  Obtain follow-up CMET today.  5. Essential hypertension Blood pressure above target.  Continue current dose of isosorbide, torsemide.  Increase carvedilol back to 25 mg twice daily.  Follow-up with hypertension clinic in 2 to 3 weeks to reassess blood pressure and obtain EKG after increasing beta-blocker dose.  6. Mixed hyperlipidemia Continue current dose of rosuvastatin.  Obtain fasting CMET, lipids today        Dispo:  Return in about 6 months (around 11/25/2020) for Routine Follow Up, w/ Dr. [name], or [name], PA-C, in person.   Medication Adjustments/Labs and Tests Ordered: Current medicines are reviewed at length with the patient today.  Concerns regarding medicines are outlined above.  Tests Ordered: Orders Placed This Encounter  Procedures  . Comprehensive metabolic panel  . Lipid panel  . AMB REFERRAL TO ADVANCED HTN CLINIC  . EKG 12-Lead   Medication Changes: Meds ordered this encounter  Medications  . potassium chloride (KLOR-CON) 10 MEQ tablet    Sig: Take 2 tablets by mouth in the morning and 1 tablet by mouth in the evening    Dispense:  270 tablet    Refill:  3    Signed, [name], PA-C   05/27/2020 8:57 AM    [facility] [place], [place], [place]  [address] Phone: [phone]; Fax: [phone]

## 2020-05-27 ENCOUNTER — Encounter: Payer: Self-pay | Admitting: Physician Assistant

## 2020-05-27 ENCOUNTER — Ambulatory Visit (INDEPENDENT_AMBULATORY_CARE_PROVIDER_SITE_OTHER): Payer: Medicare HMO | Admitting: Physician Assistant

## 2020-05-27 ENCOUNTER — Other Ambulatory Visit: Payer: Self-pay

## 2020-05-27 VITALS — BP 152/80 | HR 60 | Ht 73.0 in | Wt 336.0 lb

## 2020-05-27 DIAGNOSIS — I493 Ventricular premature depolarization: Secondary | ICD-10-CM

## 2020-05-27 DIAGNOSIS — I2510 Atherosclerotic heart disease of native coronary artery without angina pectoris: Secondary | ICD-10-CM

## 2020-05-27 DIAGNOSIS — E782 Mixed hyperlipidemia: Secondary | ICD-10-CM

## 2020-05-27 DIAGNOSIS — I10 Essential (primary) hypertension: Secondary | ICD-10-CM

## 2020-05-27 DIAGNOSIS — I5032 Chronic diastolic (congestive) heart failure: Secondary | ICD-10-CM

## 2020-05-27 DIAGNOSIS — I471 Supraventricular tachycardia, unspecified: Secondary | ICD-10-CM

## 2020-05-27 LAB — LIPID PANEL
Chol/HDL Ratio: 2.2 ratio (ref 0.0–5.0)
Cholesterol, Total: 105 mg/dL (ref 100–199)
HDL: 48 mg/dL (ref >39)
LDL Chol Calc (NIH): 45 mg/dL (ref 0–99)
Triglycerides: 47 mg/dL (ref 0–149)
VLDL Cholesterol Cal: 12 mg/dL (ref 5–40)

## 2020-05-27 LAB — COMPREHENSIVE METABOLIC PANEL
ALT: 17 IU/L (ref 0–44)
AST: 19 IU/L (ref 0–40)
Albumin/Globulin Ratio: 1.6 (ref 1.2–2.2)
Albumin: 4 g/dL (ref 3.8–4.8)
Alkaline Phosphatase: 80 IU/L (ref 44–121)
BUN/Creatinine Ratio: 9 — ABNORMAL LOW (ref 10–24)
BUN: 13 mg/dL (ref 8–27)
Bilirubin Total: 0.2 mg/dL (ref 0.0–1.2)
CO2: 26 mmol/L (ref 20–29)
Calcium: 9.6 mg/dL (ref 8.6–10.2)
Chloride: 106 mmol/L (ref 96–106)
Creatinine, Ser: 1.48 mg/dL — ABNORMAL HIGH (ref 0.76–1.27)
GFR calc Af Amer: 57 mL/min/{1.73_m2} — ABNORMAL LOW (ref >59)
GFR calc non Af Amer: 50 mL/min/{1.73_m2} — ABNORMAL LOW (ref >59)
Globulin, Total: 2.5 g/dL (ref 1.5–4.5)
Glucose: 101 mg/dL — ABNORMAL HIGH (ref 65–99)
Potassium: 4.9 mmol/L (ref 3.5–5.2)
Sodium: 142 mmol/L (ref 134–144)
Total Protein: 6.5 g/dL (ref 6.0–8.5)

## 2020-05-27 MED ORDER — POTASSIUM CHLORIDE ER 10 MEQ PO TBCR: EXTENDED_RELEASE_TABLET | ORAL | 3 refills | Status: DC

## 2020-05-27 NOTE — Patient Instructions (Signed)
Medication Instructions:  Your physician has recommended you make the following change in your medication:   1) Increase Coreg to 25 mg, 1 tablet by mouth twice a day  *If you need a refill on your cardiac medications before your next appointment, please call your pharmacy*  Lab Work: You will have labs drawn today: CMET/Lipids  Testing/Procedures: None ordered today  Follow-Up: At [facility], you and your health needs are our priority.  As part of our continuing mission to provide you with exceptional heart care, we have created designated Provider Care Teams.  These Care Teams include your primary Cardiologist (physician) and Advanced Practice Providers (APPs -  Physician Assistants and Nurse Practitioners) who all work together to provide you with the care you need, when you need it.  Your next appointment:   6 month(s)  The format for your next appointment:   In Person  Provider:   You may see Sherren Mocha, MD or [name], PA-C

## 2020-05-30 ENCOUNTER — Other Ambulatory Visit: Payer: Self-pay | Admitting: Internal Medicine

## 2020-06-16 ENCOUNTER — Ambulatory Visit: Payer: Medicare HMO

## 2020-06-16 ENCOUNTER — Ambulatory Visit (INDEPENDENT_AMBULATORY_CARE_PROVIDER_SITE_OTHER): Payer: Medicare HMO

## 2020-06-16 DIAGNOSIS — I471 Supraventricular tachycardia: Secondary | ICD-10-CM

## 2020-06-16 DIAGNOSIS — I493 Ventricular premature depolarization: Secondary | ICD-10-CM | POA: Diagnosis not present

## 2020-06-20 ENCOUNTER — Other Ambulatory Visit: Payer: Self-pay | Admitting: Internal Medicine

## 2020-06-22 ENCOUNTER — Other Ambulatory Visit: Payer: Self-pay

## 2020-06-22 ENCOUNTER — Ambulatory Visit: Payer: Medicare HMO

## 2020-06-22 NOTE — Progress Notes (Deleted)
Patient ID: [name] [name]                 DOB: November 15, 1955                      MRN: [id number]     HPI: [name] [name] is a 65 y.o. male referred by [name] to HTN clinic. PMH is significant for CAD, STEMI, CHF (EF 50%), HTN, DM (A1c 6.8), SVT, PVCs, and history of tobacco use.  Current HTN meds:  Carvedilol 747m BID, isosorbide mononitrate 354mdaily, spironolactone 12.47m84maily, torsemide 74m50mily Previously tried: amlodpine 10mg39mnazapril 40mg,8mnidine 0.2mg, 040mg, fu82memide 40mg, 8046mlos547mn 100mg,  BP 68m:   Family History:   Social History:   Diet:   Exercise:   Home BP readings:   Wt Readings from Last 3 Encounters:  05/27/20 (!) 336 lb (152.4 kg)  03/22/20 (!) 324 lb (147 kg)  02/23/20 (!) 329 lb (149.2 kg)   BP Readings from Last 3 Encounters:  05/27/20 (!) 152/80  03/22/20 138/90  02/23/20 (!) 140/92   Pulse Readings from Last 3 Encounters:  05/27/20 60  03/22/20 (!) 54  02/23/20 62    Renal function: CrCl cannot be calculated (Patient's most recent lab result is older than the maximum 21 days allowed.).  Past Medical History:  Diagnosis Date  . CAD (coronary artery disease)    a. s/p BMS pRCA 2002; b. DES to pRCA & DES p/m RCA 2006; c. PCI/DES OM4 2007; d. PCI/CBA to RCA for ISR 10/2006; e.08/2012 STEMI/Cath/PCI:  LAD 95% >> PCI: Promus Prem DES  //  f. NSTEMI 10/15 >> LHC: mLAD stent ok, dLAD 70, OM1 CTO, OM2 stent ok, dOM2 90, OM3 30-40, dLCx 90, p-mRCA stent ok, mRCA stent ok w/ 60-70 ISR, EF 50% >> med Rx // MV 1/17: no ischemia // Cath ([facility]) 05/2019: RCA 100 (CTO)   . Combined systolic and diastolic CHF    a. Myoview 1/17: EF 35%  //  b. Echo 1/17 EF 45-50% // Echo 05/2019: EF 50-55 // Echocardiogram 8/21: EF 50, inf AK, post and mid inf HK, mod LVH, RVSP 25.3, mild to mod LAE, trivial MR   . Depression   . Diabetes mellitus (HCC)    a. [place] 8.8 08/2012->Metformin initiated. => b. A1c (9/14): 6.6  . Gastroesophageal reflux disease   .  History of nuclear stress test    a. Myoview 1/17: EF 35%, fixed inferior lateral defect consistent with infarct, no ischemia, intermediate risk  . History of pneumonia   . HTN (hypertension)   . Hx of NSTEMI   . Hyperlipidemia   . Ischemic cardiomyopathy    a. EF 40%; improved to normal;  b. 08/2012 Echo: EF 50-55%, mod LVH.//  c. Echo 9/16: Inf HK, mild LVH, EF 55%, mild LAE, normal RVF, mild RAE, PASP 35 mmHg  //  d. Echo 1/17: EF 45-50%, inferior HK, mild BAE, PASP 33 mmHg  . Obesity   . OSA (obstructive sleep apnea)    Does not use CPAP as of 05/2011  . Tobacco abuse     Current Outpatient Medications on File Prior to Visit  Medication Sig Dispense Refill  . Alcohol Swabs (B-D SINGLE USE SWABS BUTTERFLY) PADS Use up to 4 times a day to test blood sugars. Dx: E10.9, E11.9 100 each 11  . aspirin 81 MG tablet Take 81 mg by mouth daily.    . Blood Glucose Calibration (TRUE METRIX LEVEL  1) Low SOLN Use as needed. Dx: E10.9, E11.9 1 each 0  . blood glucose meter kit and supplies Dispense based on patient and insurance preference. Use up to four times daily as directed. (FOR ICD-10 E10.9, E11.9). 1 each 0  . carvedilol (COREG) 25 MG tablet Take 1 tablet (25 mg total) by mouth 2 (two) times daily with a meal. 180 tablet 3  . clopidogrel (PLAVIX) 75 MG tablet Take 1 tablet (75 mg total) by mouth daily. 90 tablet 3  . gabapentin (NEURONTIN) 300 MG capsule Take 1 capsule (300 mg total) by mouth 3 (three) times daily. 270 capsule 1  . glipiZIDE (GLUCOTROL XL) 2.5 MG 24 hr tablet Take 1 tablet (2.5 mg total) by mouth daily. 90 tablet 3  . glucose blood (TRUE METRIX BLOOD GLUCOSE TEST) test strip Use to test blood sugars up to 4 times a day. DX: E10.9, E.11.9 100 each 12  . isosorbide mononitrate (IMDUR) 30 MG 24 hr tablet Take 3 tablets (90 mg total) by mouth daily. 90 tablet 3  . Lancets 33G MISC Use to test blood sugars up to 4 times a day. DX: E10.9, E11.9 100 each 6  . Multiple  Vitamins-Minerals (VITAMIN D3 COMPLETE PO) Take 1 tablet by mouth daily.    . nitroGLYCERIN (NITROSTAT) 0.4 MG SL tablet PLACE 1 TABLET UNDER THE TONGUE EVERY 5 MINUTES AS NEEDED FOR CHEST PAIN 25 tablet 5  . pantoprazole (PROTONIX) 40 MG tablet Take 1 tablet (40 mg total) by mouth daily. 90 tablet 3  . potassium chloride (KLOR-CON) 10 MEQ tablet Take 2 tablets by mouth in the morning and 1 tablet by mouth in the evening 270 tablet 3  . rosuvastatin (CRESTOR) 40 MG tablet Take 1 tablet (40 mg total) by mouth daily. 90 tablet 1  . spironolactone (ALDACTONE) 25 MG tablet Take 0.5 tablets (12.5 mg total) by mouth daily. 45 tablet 3  . torsemide (DEMADEX) 20 MG tablet     . traMADol (ULTRAM) 50 MG tablet TAKE 1 TABLET(50 MG) BY MOUTH EVERY 6 HOURS AS NEEDED 120 tablet 0  . vitamin B-12 (CYANOCOBALAMIN) 1000 MCG tablet Take 1 tablet (1,000 mcg total) by mouth daily. 90 tablet 3   No current facility-administered medications on file prior to visit.    Allergies  Allergen Reactions  . Penicillin G Other (See Comments)    Did it involve swelling of the face/tongue/throat, SOB, or low BP?  N/A Did it involve sudden or severe rash/hives, skin peeling, or any reaction on the inside of your mouth or nose? N/A Did you need to seek medical attention at a hospital or doctor's office? N/A When did it last happen?Child If all above answers are "NO", may proceed with cephalosporin use.  [name] Penicillins Other (See Comments)    Did it involve swelling of the face/tongue/throat, SOB, or low BP? N/A Did it involve sudden or severe rash/hives, skin peeling, or any reaction on the inside of your mouth or nose?N/A Did you need to seek medical attention at a hospital or doctor's office? N/A When did it last happen?Child If all above answers are "NO", may proceed with cephalosporin use.     Assessment/Plan:  1. Hypertension -

## 2020-06-23 ENCOUNTER — Other Ambulatory Visit: Payer: Self-pay

## 2020-06-23 ENCOUNTER — Encounter: Payer: Self-pay | Admitting: Internal Medicine

## 2020-06-23 ENCOUNTER — Ambulatory Visit (INDEPENDENT_AMBULATORY_CARE_PROVIDER_SITE_OTHER): Payer: Medicare HMO | Admitting: Internal Medicine

## 2020-06-23 VITALS — BP 148/92 | HR 67 | Temp 98.6°F | Ht 73.0 in | Wt 321.0 lb

## 2020-06-23 DIAGNOSIS — I10 Essential (primary) hypertension: Secondary | ICD-10-CM

## 2020-06-23 DIAGNOSIS — N1831 Chronic kidney disease, stage 3a: Secondary | ICD-10-CM | POA: Diagnosis not present

## 2020-06-23 DIAGNOSIS — E1159 Type 2 diabetes mellitus with other circulatory complications: Secondary | ICD-10-CM | POA: Diagnosis not present

## 2020-06-23 DIAGNOSIS — E538 Deficiency of other specified B group vitamins: Secondary | ICD-10-CM | POA: Diagnosis not present

## 2020-06-23 DIAGNOSIS — E1122 Type 2 diabetes mellitus with diabetic chronic kidney disease: Secondary | ICD-10-CM | POA: Diagnosis not present

## 2020-06-23 DIAGNOSIS — Z0000 Encounter for general adult medical examination without abnormal findings: Secondary | ICD-10-CM

## 2020-06-23 DIAGNOSIS — E782 Mixed hyperlipidemia: Secondary | ICD-10-CM | POA: Diagnosis not present

## 2020-06-23 DIAGNOSIS — F418 Other specified anxiety disorders: Secondary | ICD-10-CM

## 2020-06-23 DIAGNOSIS — E559 Vitamin D deficiency, unspecified: Secondary | ICD-10-CM

## 2020-06-23 DIAGNOSIS — Z0001 Encounter for general adult medical examination with abnormal findings: Secondary | ICD-10-CM

## 2020-06-23 LAB — PSA: PSA: 1.07 ng/mL (ref 0.10–4.00)

## 2020-06-23 LAB — BASIC METABOLIC PANEL
BUN: 20 mg/dL (ref 6–23)
CO2: 28 mEq/L (ref 19–32)
Calcium: 10 mg/dL (ref 8.4–10.5)
Chloride: 108 mEq/L (ref 96–112)
Creatinine, Ser: 1.76 mg/dL — ABNORMAL HIGH (ref 0.40–1.50)
GFR: 40.69 mL/min — ABNORMAL LOW (ref >60.00)
Glucose, Bld: 80 mg/dL (ref 70–99)
Potassium: 5 mEq/L (ref 3.5–5.1)
Sodium: 143 mEq/L (ref 135–145)

## 2020-06-23 LAB — URINALYSIS, ROUTINE W REFLEX MICROSCOPIC
Bilirubin Urine: NEGATIVE
Hgb urine dipstick: NEGATIVE
Ketones, ur: NEGATIVE
Leukocytes,Ua: NEGATIVE
Nitrite: NEGATIVE
RBC / HPF: NONE SEEN (ref 0–?)
Specific Gravity, Urine: >=1.03 — AB (ref 1.000–1.030)
Total Protein, Urine: 100 — AB
Urine Glucose: NEGATIVE
Urobilinogen, UA: 1 (ref 0.0–1.0)
pH: 5.5 (ref 5.0–8.0)

## 2020-06-23 LAB — HEPATIC FUNCTION PANEL
ALT: 12 U/L (ref 0–53)
AST: 12 U/L (ref 0–37)
Albumin: 4.3 g/dL (ref 3.5–5.2)
Alkaline Phosphatase: 74 U/L (ref 39–117)
Bilirubin, Direct: 0.1 mg/dL (ref 0.0–0.3)
Total Bilirubin: 0.4 mg/dL (ref 0.2–1.2)
Total Protein: 7 g/dL (ref 6.0–8.3)

## 2020-06-23 LAB — CBC WITH DIFFERENTIAL/PLATELET
Basophils Absolute: 0 10*3/uL (ref 0.0–0.1)
Basophils Relative: 0.7 % (ref 0.0–3.0)
Eosinophils Absolute: 0.4 10*3/uL (ref 0.0–0.7)
Eosinophils Relative: 6.9 % — ABNORMAL HIGH (ref 0.0–5.0)
HCT: 46.7 % (ref 39.0–52.0)
Hemoglobin: 15.1 g/dL (ref 13.0–17.0)
Lymphocytes Relative: 45.8 % (ref 12.0–46.0)
Lymphs Abs: 2.5 10*3/uL (ref 0.7–4.0)
MCHC: 32.3 g/dL (ref 30.0–36.0)
MCV: 88.5 fl (ref 78.0–100.0)
Monocytes Absolute: 0.5 10*3/uL (ref 0.1–1.0)
Monocytes Relative: 9.3 % (ref 3.0–12.0)
Neutro Abs: 2.1 10*3/uL (ref 1.4–7.7)
Neutrophils Relative %: 37.3 % — ABNORMAL LOW (ref 43.0–77.0)
Platelets: 196 10*3/uL (ref 150.0–400.0)
RBC: 5.27 Mil/uL (ref 4.22–5.81)
RDW: 15.9 % — ABNORMAL HIGH (ref 11.5–15.5)
WBC: 5.6 10*3/uL (ref 4.0–10.5)

## 2020-06-23 LAB — MICROALBUMIN / CREATININE URINE RATIO
Creatinine,U: 209.4 mg/dL
Microalb Creat Ratio: 30.6 mg/g — ABNORMAL HIGH (ref 0.0–30.0)
Microalb, Ur: 64.1 mg/dL — ABNORMAL HIGH (ref 0.0–1.9)

## 2020-06-23 LAB — VITAMIN B12: Vitamin B-12: 555 pg/mL (ref 211–911)

## 2020-06-23 LAB — TSH: TSH: 0.86 u[IU]/mL (ref 0.35–4.50)

## 2020-06-23 LAB — HEMOGLOBIN A1C: Hgb A1c MFr Bld: 6.7 % — ABNORMAL HIGH (ref 4.6–6.5)

## 2020-06-23 LAB — LIPID PANEL
Cholesterol: 138 mg/dL (ref 0–200)
HDL: 44.7 mg/dL (ref >39.00)
LDL Cholesterol: 80 mg/dL (ref 0–99)
NonHDL: 93.73
Total CHOL/HDL Ratio: 3
Triglycerides: 71 mg/dL (ref 0.0–149.0)
VLDL: 14.2 mg/dL (ref 0.0–40.0)

## 2020-06-23 LAB — PHOSPHORUS: Phosphorus: 3.5 mg/dL (ref 2.3–4.6)

## 2020-06-23 LAB — VITAMIN D 25 HYDROXY (VIT D DEFICIENCY, FRACTURES): VITD: 41.96 ng/mL (ref 30.00–100.00)

## 2020-06-23 MED ORDER — PAROXETINE HCL 20 MG PO TABS: 20.0000 mg | ORAL_TABLET | Freq: Every day | ORAL | 3 refills | Status: DC

## 2020-06-23 NOTE — Patient Instructions (Signed)
Please take all new medication as prescribed - the paxil at 20 mg per day  Please continue all other medications as before, and refills have been done if requested.  Please have the pharmacy call with any other refills you may need.  Please continue your efforts at being more active, low cholesterol diet, and weight control.  You are otherwise up to date with prevention measures today.  Please keep your appointments with your specialists as you may have planned  Please go to the LAB at the blood drawing area for the tests to be done  You will be contacted by phone if any changes need to be made immediately.  Otherwise, you will receive a letter about your results with an explanation, but please check with MyChart first.  Please remember to sign up for MyChart if you have not done so, as this will be important to you in the future with finding out test results, communicating by private email, and scheduling acute appointments online when needed.  Please make an Appointment to return in 6 months, or sooner if needed

## 2020-06-23 NOTE — Progress Notes (Signed)
Established Patient Office Visit  Subjective:  Patient ID: [name] [name], male    DOB: 29-Nov-1955  Age: 65 y.o. MRN: [id number]        Chief Complaint:: wellness exam and Medication Management (Meds for DM seems to make him hypoglycemic; discuss need for paxil) and uncontrolled HTN and afib       HPI:  [name] [name] is a 65 y.o. male here for wellness exam      ; did not take BP meds this am in rush to get here but states usually good compliance,and BP at home always seem < 140/90 but not checked recently.  Has had 1 low sugar after did not eat with taking meds in AM, o/w  Pt denies polydipsia, polyuria, or low sugar symptoms such as weakness or confusion improved with po intake.  Pt states overall good compliance with meds, trying to follow lower cholesterol, diabetic diet, wt overall stable but little exercise however.   Pt denies chest pain, increased sob or doe, wheezing, orthopnea, PND, increased LE swelling, palpitations, dizziness or syncope.  Pt denies new neurological symptoms such as new headache, or facial or extremity weakness or numbness  Has had marked increase anxiety and mild depression without SI or HI recenlty due to ongoing social stressors it seems, asks for restart paxil as this worked well in past.  Lives alone, gets very nervous, can spend several days in bed at a time Wt Readings from Last 3 Encounters:  06/23/20 (!) 321 lb (145.6 kg)  05/27/20 (!) 336 lb (152.4 kg)  03/22/20 (!) 324 lb (147 kg)   BP Readings from Last 3 Encounters:  06/23/20 (!) 148/92  05/27/20 (!) 152/80  03/22/20 138/90         Past Medical History:  Diagnosis Date  . CAD (coronary artery disease)    a. s/p BMS pRCA 2002; b. DES to pRCA & DES p/m RCA 2006; c. PCI/DES OM4 2007; d. PCI/CBA to RCA for ISR 10/2006; e.08/2012 STEMI/Cath/PCI:  LAD 95% >> PCI: Promus Prem DES  //  f. NSTEMI 10/15 >> LHC: mLAD stent ok, dLAD 70, OM1 CTO, OM2 stent ok, dOM2 90, OM3 30-40, dLCx 90, p-mRCA stent ok, mRCA  stent ok w/ 60-70 ISR, EF 50% >> med Rx // MV 1/17: no ischemia // Cath ([facility]) 05/2019: RCA 100 (CTO)   . Combined systolic and diastolic CHF    a. Myoview 1/17: EF 35%  //  b. Echo 1/17 EF 45-50% // Echo 05/2019: EF 50-55 // Echocardiogram 8/21: EF 50, inf AK, post and mid inf HK, mod LVH, RVSP 25.3, mild to mod LAE, trivial MR   . Depression   . Diabetes mellitus ([place])    a. A1c 8.8 08/2012->Metformin initiated. => b. A1c (9/14): 6.6  . Gastroesophageal reflux disease   . History of nuclear stress test    a. Myoview 1/17: EF 35%, fixed inferior lateral defect consistent with infarct, no ischemia, intermediate risk  . History of pneumonia   . HTN (hypertension)   . Hx of NSTEMI   . Hyperlipidemia   . Ischemic cardiomyopathy    a. EF 40%; improved to normal;  b. 08/2012 Echo: EF 50-55%, mod LVH.//  c. Echo 9/16: Inf HK, mild LVH, EF 55%, mild LAE, normal RVF, mild RAE, PASP 35 mmHg  //  d. Echo 1/17: EF 45-50%, inferior HK, mild BAE, PASP 33 mmHg  . Obesity   . OSA (obstructive sleep apnea)    Does not use  CPAP as of 05/2011  . Tobacco abuse    Past Surgical History:  Procedure Laterality Date  . CORONARY ANGIOPLASTY WITH STENT PLACEMENT    . CORONARY STENT PLACEMENT  2009  . LEFT HEART CATHETERIZATION WITH CORONARY ANGIOGRAM N/A 08/31/2012   Procedure: LEFT HEART CATHETERIZATION WITH CORONARY ANGIOGRAM;  Surgeon: Burnell Blanks, MD;  Location: [facility] CATH LAB;  Service: Cardiovascular;  Laterality: N/A;  . LEFT HEART CATHETERIZATION WITH CORONARY ANGIOGRAM N/A 11/16/2013   Procedure: LEFT HEART CATHETERIZATION WITH CORONARY ANGIOGRAM;  Surgeon: Blane Ohara, MD;  Location: [facility] CATH LAB;  Service: Cardiovascular;  Laterality: N/A;  . LEFT HEART CATHETERIZATION WITH CORONARY ANGIOGRAM N/A 03/15/2014   Procedure: LEFT HEART CATHETERIZATION WITH CORONARY ANGIOGRAM;  Surgeon: Peter M Martinique, MD;  Location: [facility] CATH LAB;  Service: Cardiovascular;  Laterality: N/A;  . PERCUTANEOUS CORONARY  STENT INTERVENTION (PCI-S) Right 08/31/2012   Procedure: PERCUTANEOUS CORONARY STENT INTERVENTION (PCI-S);  Surgeon: Burnell Blanks, MD;  Location: [facility] CATH LAB;  Service: Cardiovascular;  Laterality: Right;  . PERCUTANEOUS CORONARY STENT INTERVENTION (PCI-S)  11/16/2013   Procedure: PERCUTANEOUS CORONARY STENT INTERVENTION (PCI-S);  Surgeon: Blane Ohara, MD;  Location: [facility] CATH LAB;  Service: Cardiovascular;;    reports that he has quit smoking. His smoking use included cigarettes. He has a 15.00 pack-year smoking history. He has never used smokeless tobacco. He reports that he does not drink alcohol and does not use drugs. family history includes Cancer in his mother; Coronary artery disease in an other family member; Depression in his mother; Hypertension in his mother; Other in his father. Allergies  Allergen Reactions  . Penicillin G Other (See Comments)    Did it involve swelling of the face/tongue/throat, SOB, or low BP?  N/A Did it involve sudden or severe rash/hives, skin peeling, or any reaction on the inside of your mouth or nose? N/A Did you need to seek medical attention at a hospital or doctor's office? N/A When did it last happen?Child If all above answers are "NO", may proceed with cephalosporin use.  [name] Penicillins Other (See Comments)    Did it involve swelling of the face/tongue/throat, SOB, or low BP? N/A Did it involve sudden or severe rash/hives, skin peeling, or any reaction on the inside of your mouth or nose?N/A Did you need to seek medical attention at a hospital or doctor's office? N/A When did it last happen?Child If all above answers are "NO", may proceed with cephalosporin use.   Current Outpatient Medications on File Prior to Visit  Medication Sig Dispense Refill  . Alcohol Swabs (B-D SINGLE USE SWABS BUTTERFLY) PADS Use up to 4 times a day to test blood sugars. Dx: E10.9, E11.9 100 each 11  . aspirin 81 MG tablet Take 81 mg by mouth daily.    .  Blood Glucose Calibration (TRUE METRIX LEVEL 1) Low SOLN Use as needed. Dx: E10.9, E11.9 1 each 0  . blood glucose meter kit and supplies Dispense based on patient and insurance preference. Use up to four times daily as directed. (FOR ICD-10 E10.9, E11.9). 1 each 0  . carvedilol (COREG) 25 MG tablet Take 1 tablet (25 mg total) by mouth 2 (two) times daily with a meal. 180 tablet 3  . gabapentin (NEURONTIN) 300 MG capsule Take 1 capsule (300 mg total) by mouth 3 (three) times daily. 270 capsule 1  . glucose blood (TRUE METRIX BLOOD GLUCOSE TEST) test strip Use to test blood sugars up to 4 times a day.  DX: E10.9, E.11.9 100 each 12  . isosorbide mononitrate (IMDUR) 30 MG 24 hr tablet Take 3 tablets (90 mg total) by mouth daily. 90 tablet 3  . Lancets 33G MISC Use to test blood sugars up to 4 times a day. DX: E10.9, E11.9 100 each 6  . Multiple Vitamins-Minerals (VITAMIN D3 COMPLETE PO) Take 1 tablet by mouth daily.    . nitroGLYCERIN (NITROSTAT) 0.4 MG SL tablet PLACE 1 TABLET UNDER THE TONGUE EVERY 5 MINUTES AS NEEDED FOR CHEST PAIN 25 tablet 5  . pantoprazole (PROTONIX) 40 MG tablet Take 1 tablet (40 mg total) by mouth daily. 90 tablet 3  . potassium chloride (KLOR-CON) 10 MEQ tablet Take 2 tablets by mouth in the morning and 1 tablet by mouth in the evening 270 tablet 3  . rosuvastatin (CRESTOR) 40 MG tablet Take 1 tablet (40 mg total) by mouth daily. 90 tablet 1  . spironolactone (ALDACTONE) 25 MG tablet Take 0.5 tablets (12.5 mg total) by mouth daily. 45 tablet 3  . torsemide (DEMADEX) 20 MG tablet     . traMADol (ULTRAM) 50 MG tablet TAKE 1 TABLET(50 MG) BY MOUTH EVERY 6 HOURS AS NEEDED 120 tablet 0  . vitamin B-12 (CYANOCOBALAMIN) 1000 MCG tablet Take 1 tablet (1,000 mcg total) by mouth daily. 90 tablet 3  . clopidogrel (PLAVIX) 75 MG tablet Take 1 tablet (75 mg total) by mouth daily. (Patient not taking: Reported on 06/23/2020) 90 tablet 3   No current facility-administered medications on file  prior to visit.        ROS:  All others reviewed and negative.  Objective        PE:  BP (!) 148/92 (BP Location: Left Arm, Patient Position: Sitting, Cuff Size: Large)   Pulse 67   Temp 98.6 F (37 C) (Oral)   Ht _0  (1.854 m)   Wt (!) 321 lb (145.6 kg)   SpO2 97%   BMI 42.35 kg/m                 Constitutional: Pt appears in NAD               HENT: Head: NCAT.                Right Ear: External ear normal.                 Left Ear: External ear normal.                Eyes: . Pupils are equal, round, and reactive to light. Conjunctivae and EOM are normal               Nose: without d/c or deformity               Neck: Neck supple. Gross normal ROM               Cardiovascular: Normal rate and regular rhythm.                 Pulmonary/Chest: Effort normal and breath sounds without rales or wheezing.                Abd:  Soft, NT, ND, + BS, no organomegaly               Neurological: Pt is alert. At baseline orientation, motor grossly intact               Skin: Skin is warm. No rashes, no  other new lesions, LE edema - trace bilat               Psychiatric: Pt behavior is normal without agitation   Assessment/Plan:  [name] [name] is a 65 y.o. Black or African American [2] male with  has a past medical history of CAD (coronary artery disease), Combined systolic and diastolic CHF, Depression, Diabetes mellitus ([place]), Gastroesophageal reflux disease, History of nuclear stress test, History of pneumonia, HTN (hypertension), Hx of NSTEMI, Hyperlipidemia, Ischemic cardiomyopathy, Obesity, OSA (obstructive sleep apnea), and Tobacco abuse.    Assessment Plan  See notes Labs/data reviewed for each problem:  Micro: none  Cardiac tracings I have personally interpreted today:  none  Pertinent Radiological findings (summarize): Jan 14 2020 echo with ef 50%, LAA; also CTA chest Aug 25, 2019  1. No evidence of acute pulmonary artery filling defects to suggest pulmonary embolism. 2. No  acute abnormality identified in the chest. 3. Cardiomegaly with four-chamber enlargement. Three-vessel coronary artery disease with a stent in the LAD. 4. Multiple foci of venous gas and air within the right ventricle, most commonly iatrogenic and related to intravenous access. 5. Aortic Atherosclerosis (ICD10-I70.0).     Health Maintenance Due  Topic Date Due  . OPHTHALMOLOGY EXAM  Never done    There are no preventive care reminders to display for this patient.  Lab Results  Component Value Date   TSH 0.86 06/23/2020   Lab Results  Component Value Date   WBC 5.6 06/23/2020   HGB 15.1 06/23/2020   HCT 46.7 06/23/2020   MCV 88.5 06/23/2020   PLT 196.0 06/23/2020   Lab Results  Component Value Date   NA 143 06/23/2020   K 5.0 06/23/2020   CO2 28 06/23/2020   GLUCOSE 80 06/23/2020   BUN 20 06/23/2020   CREATININE 1.76 (H) 06/23/2020   BILITOT 0.4 06/23/2020   ALKPHOS 74 06/23/2020   AST 12 06/23/2020   ALT 12 06/23/2020   PROT 7.0 06/23/2020   ALBUMIN 4.3 06/23/2020   CALCIUM 9.8 06/23/2020   CALCIUM 10.0 06/23/2020   ANIONGAP 10 08/25/2019   GFR 40.69 (L) 06/23/2020   Lab Results  Component Value Date   CHOL 138 06/23/2020   Lab Results  Component Value Date   HDL 44.70 06/23/2020   Lab Results  Component Value Date   LDLCALC 80 06/23/2020   Lab Results  Component Value Date   TRIG 71.0 06/23/2020   Lab Results  Component Value Date   CHOLHDL 3 06/23/2020   Lab Results  Component Value Date   HGBA1C 6.7 (H) 06/23/2020      Assessment & Plan:   Problem List Items Addressed This Visit      High   Encounter for well adult exam with abnormal findings - Primary    Overall doing well, age appropriate education and counseling updated, referrals for preventative services and immunizations addressed, dietary and smoking counseling addressed, most recent labs reviewed.  I have personally reviewed and have noted:  1) the patient's medical and  social history 2) The pt's use of alcohol, tobacco, and illicit drugs 3) The patient's current medications and supplements 4) Functional ability including ADL's, fall risk, home safety risk, hearing and visual impairment 5) Diet and physical activities 6) Evidence for depression or mood disorder 7) The patient's height, weight, and BMI have been recorded in the chart  I have made referrals, and provided counseling and education based on review of the above  Relevant Orders   PSA (Completed)     Medium   Vitamin D deficiency    Last vitamin D Lab Results  Component Value Date   VD25OH 41.96 06/23/2020   Stable, cont oral replacement       Relevant Orders   VITAMIN D 25 Hydroxy (Vit-D Deficiency, Fractures) (Completed)   Hyperlipidemia    Lab Results  Component Value Date   LDLCALC 80 06/23/2020   Stable, pt to continue current statin crestor 40      Essential hypertension    BP Readings from Last 3 Encounters:  06/23/20 (!) 148/92  05/27/20 (!) 152/80  03/22/20 138/90   Stable, pt to continue medical treatment coreg, aldactone, demadex      DM2 (diabetes mellitus, type 2) ([place])    Lab Results  Component Value Date   HGBA1C 6.7 (H) 06/23/2020   Stable, pt to continue current medical treatment - diet   Current Outpatient Medications (Cardiovascular):  .  carvedilol (COREG) 25 MG tablet, Take 1 tablet (25 mg total) by mouth 2 (two) times daily with a meal. .  isosorbide mononitrate (IMDUR) 30 MG 24 hr tablet, Take 3 tablets (90 mg total) by mouth daily. .  nitroGLYCERIN (NITROSTAT) 0.4 MG SL tablet, PLACE 1 TABLET UNDER THE TONGUE EVERY 5 MINUTES AS NEEDED FOR CHEST PAIN .  rosuvastatin (CRESTOR) 40 MG tablet, Take 1 tablet (40 mg total) by mouth daily. [name]  spironolactone (ALDACTONE) 25 MG tablet, Take 0.5 tablets (12.5 mg total) by mouth daily. [name]  torsemide (DEMADEX) 20 MG tablet,    Current Outpatient Medications (Analgesics):  .  aspirin 81 MG tablet,  Take 81 mg by mouth daily. .  traMADol (ULTRAM) 50 MG tablet, TAKE 1 TABLET(50 MG) BY MOUTH EVERY 6 HOURS AS NEEDED  Current Outpatient Medications (Hematological):  .  vitamin B-12 (CYANOCOBALAMIN) 1000 MCG tablet, Take 1 tablet (1,000 mcg total) by mouth daily. .  clopidogrel (PLAVIX) 75 MG tablet, Take 1 tablet (75 mg total) by mouth daily. (Patient not taking: Reported on 06/23/2020)  Current Outpatient Medications (Other):  [name]  Alcohol Swabs (B-D SINGLE USE SWABS BUTTERFLY) PADS, Use up to 4 times a day to test blood sugars. Dx: E10.9, E11.9 .  Blood Glucose Calibration (TRUE METRIX LEVEL 1) Low SOLN, Use as needed. Dx: E10.9, E11.9 .  blood glucose meter kit and supplies, Dispense based on patient and insurance preference. Use up to four times daily as directed. (FOR ICD-10 E10.9, E11.9). [name]  gabapentin (NEURONTIN) 300 MG capsule, Take 1 capsule (300 mg total) by mouth 3 (three) times daily. [name]  glucose blood (TRUE METRIX BLOOD GLUCOSE TEST) test strip, Use to test blood sugars up to 4 times a day. DX: E10.9, E.11.9 .  Lancets 33G MISC, Use to test blood sugars up to 4 times a day. DX: E10.9, E11.9 .  Multiple Vitamins-Minerals (VITAMIN D3 COMPLETE PO), Take 1 tablet by mouth daily. .  pantoprazole (PROTONIX) 40 MG tablet, Take 1 tablet (40 mg total) by mouth daily. [name]  PARoxetine (PAXIL) 20 MG tablet, Take 1 tablet (20 mg total) by mouth daily. .  potassium chloride (KLOR-CON) 10 MEQ tablet, Take 2 tablets by mouth in the morning and 1 tablet by mouth in the evening       Relevant Orders   Microalbumin / creatinine urine ratio (Completed)   Hemoglobin A1c (Completed)   Lipid panel (Completed)   Hepatic function panel (Completed)   CBC with Differential/Platelet (Completed)   TSH (  Completed)   Urinalysis, Routine w reflex microscopic (Completed)   Basic metabolic panel (Completed)   CKD (chronic kidney disease), stage III (HCC)    Lab Results  Component Value Date   CREATININE 1.76  (H) 06/23/2020   Stable overall, cont to avoid nephrotoxins       Relevant Orders   PTH, intact and calcium (Completed)   Phosphorus (Completed)   B12 deficiency    Lab Results  Component Value Date   VITAMINB12 555 06/23/2020   Stable, cont oral replacement - b12 1000 mcg qd       Relevant Orders   Vitamin B12 (Completed)   Anxiety with depression    Worsening, denies SI or HI, Ok for restart paxil 20 qd, pt plans to have counseling on his own and has started the process of getting care      Relevant Medications   PARoxetine (PAXIL) 20 MG tablet      Meds ordered this encounter  Medications  . PARoxetine (PAXIL) 20 MG tablet    Sig: Take 1 tablet (20 mg total) by mouth daily.    Dispense:  90 tablet    Refill:  3    Follow-up: Return in about 6 months (around 12/21/2020).   Cathlean Cower, MD 06/23/2020 8:11 AM [facility]

## 2020-06-24 LAB — PTH, INTACT AND CALCIUM
Calcium: 9.8 mg/dL (ref 8.6–10.3)
PTH: 29 pg/mL (ref 14–64)

## 2020-06-26 ENCOUNTER — Encounter: Payer: Self-pay | Admitting: Internal Medicine

## 2020-06-26 NOTE — Assessment & Plan Note (Signed)
Lab Results  Component Value Date   CREATININE 1.76 (H) 06/23/2020   Stable overall, cont to avoid nephrotoxins

## 2020-06-26 NOTE — Assessment & Plan Note (Signed)
Lab Results  Component Value Date   [id number] 121 06/23/2020   Stable, cont oral replacement - b12 1000 mcg qd

## 2020-06-26 NOTE — Assessment & Plan Note (Addendum)
Worsening, denies SI or HI, Ok for restart paxil 20 qd, pt plans to have counseling on his own and has started the process of getting care

## 2020-06-26 NOTE — Assessment & Plan Note (Signed)
Lab Results  Component Value Date   LDLCALC 80 06/23/2020   Stable, pt to continue current statin crestor 40

## 2020-06-26 NOTE — Assessment & Plan Note (Signed)
Lab Results  Component Value Date   HGBA1C 6.7 (H) 06/23/2020   Stable, pt to continue current medical treatment - diet   Current Outpatient Medications (Cardiovascular):  .  carvedilol (COREG) 25 MG tablet, Take 1 tablet (25 mg total) by mouth 2 (two) times daily with a meal. .  isosorbide mononitrate (IMDUR) 30 MG 24 hr tablet, Take 3 tablets (90 mg total) by mouth daily. .  nitroGLYCERIN (NITROSTAT) 0.4 MG SL tablet, PLACE 1 TABLET UNDER THE TONGUE EVERY 5 MINUTES AS NEEDED FOR CHEST PAIN .  rosuvastatin (CRESTOR) 40 MG tablet, Take 1 tablet (40 mg total) by mouth daily. [name]  spironolactone (ALDACTONE) 25 MG tablet, Take 0.5 tablets (12.5 mg total) by mouth daily. [name]  torsemide (DEMADEX) 20 MG tablet,    Current Outpatient Medications (Analgesics):  .  aspirin 81 MG tablet, Take 81 mg by mouth daily. .  traMADol (ULTRAM) 50 MG tablet, TAKE 1 TABLET(50 MG) BY MOUTH EVERY 6 HOURS AS NEEDED  Current Outpatient Medications (Hematological):  .  vitamin B-12 (CYANOCOBALAMIN) 1000 MCG tablet, Take 1 tablet (1,000 mcg total) by mouth daily. .  clopidogrel (PLAVIX) 75 MG tablet, Take 1 tablet (75 mg total) by mouth daily. (Patient not taking: Reported on 06/23/2020)  Current Outpatient Medications (Other):  [name]  Alcohol Swabs (B-D SINGLE USE SWABS BUTTERFLY) PADS, Use up to 4 times a day to test blood sugars. Dx: E10.9, E11.9 .  Blood Glucose Calibration (TRUE METRIX LEVEL 1) Low SOLN, Use as needed. Dx: E10.9, E11.9 .  blood glucose meter kit and supplies, Dispense based on patient and insurance preference. Use up to four times daily as directed. (FOR ICD-10 E10.9, E11.9). [name]  gabapentin (NEURONTIN) 300 MG capsule, Take 1 capsule (300 mg total) by mouth 3 (three) times daily. [name]  glucose blood (TRUE METRIX BLOOD GLUCOSE TEST) test strip, Use to test blood sugars up to 4 times a day. DX: E10.9, E.11.9 .  Lancets 33G MISC, Use to test blood sugars up to 4 times a day. DX: E10.9, E11.9 .  Multiple  Vitamins-Minerals (VITAMIN D3 COMPLETE PO), Take 1 tablet by mouth daily. .  pantoprazole (PROTONIX) 40 MG tablet, Take 1 tablet (40 mg total) by mouth daily. [name]  PARoxetine (PAXIL) 20 MG tablet, Take 1 tablet (20 mg total) by mouth daily. .  potassium chloride (KLOR-CON) 10 MEQ tablet, Take 2 tablets by mouth in the morning and 1 tablet by mouth in the evening

## 2020-06-26 NOTE — Assessment & Plan Note (Signed)
BP Readings from Last 3 Encounters:  06/23/20 (!) 148/92  05/27/20 (!) 152/80  03/22/20 138/90   Stable, pt to continue medical treatment coreg, aldactone, demadex

## 2020-06-26 NOTE — Assessment & Plan Note (Signed)

## 2020-06-26 NOTE — Assessment & Plan Note (Signed)
Last vitamin D Lab Results  Component Value Date   VD25OH 41.96 06/23/2020   Stable, cont oral replacement

## 2020-06-28 NOTE — Progress Notes (Unsigned)
Patient ID: [name] [name]                 DOB: 03-07-1956                      MRN: [id number]     HPI: [name] [name] is a 65 y.o. male referred of Dr. [name] by [name], PA  to HTN clinic. PMH is significant for CAD s/p PCI to RCA and PL1, STEMI in 08/2012 s/p DES to LAD, NSTEMI 11/2013 s/p DES to [place], NSTEMI 03/2014, HFpEF with EF 50% 01/2020, SVT, PVCs, HTN, HLD, CKD, T2DM, OSA, GERD, and tobacco abuse. ZIO monitor 02/2020 should NSR at 59 bpm, no afib/aflutter, no high-grade heart block, frequent PVCs (6% burden) and supraventricular beats (3.7% burden). He was last seen in cardiology clinic on 05/27/2020 by [name] for SVT follow-up after beta-blocker dose increase in the setting of supraventricular tachycardia. BP 152/80 mmHg during clinic.  During visit, carvedilol was increased to 25 mg BID and was continued on all other medications.  Home BP readings? Is pt checking? Diet and exercise? Current meds -- confirm Amlodipine looks like it was d/c after hospitalization in 2017, same with losartan and clonidine secondary to AKI/hypotension; losartan restarted but d/c June 2021? Scr elevated to 1.76 from December; dehydration? Ask about K supplement - 10 mEq morning and 20 mEq evening  Could restart amlodipine for more BP control Would not start ARB d/t K Other option is hydral  Lipids: nexletol - zetia/bempedoic acid   Current HTN/cardiac meds:  carvedilol 25 mg BID spironolactone 12.5 mg daily torsemide 20 mg daily Isosorbide mononitrate 90 mg daily  Previously tried:  amlodipine 10 mg daily benazepril 40 mg daily clonidine 0.2 mg BID losartan 100 mg daily  BP goal: <130/80 mmHg  Family History: The patient's family history includes Cancer in his mother; Coronary artery disease in an other family member; Depression in his mother; Hypertension in his mother; Other in his father. There is no history of Heart attack or Stroke.  Social History: Former smoker, 15  pack-years  Diet:   Exercise:   Home BP readings:   Labs: 06/23/2020: Na 143, K 5, Scr 1.76 05/27/2020: Na 142, K 4.9, Scr 1.48  Wt Readings from Last 3 Encounters:  06/23/20 (!) 321 lb (145.6 kg)  05/27/20 (!) 336 lb (152.4 kg)  03/22/20 (!) 324 lb (147 kg)   BP Readings from Last 3 Encounters:  06/23/20 (!) 148/92  05/27/20 (!) 152/80  03/22/20 138/90   Pulse Readings from Last 3 Encounters:  06/23/20 67  05/27/20 60  03/22/20 (!) 54    Renal function: Estimated Creatinine Clearance: 64.5 mL/min (A) (by C-G formula based on SCr of 1.76 mg/dL (H)).  Past Medical History:  Diagnosis Date  . CAD (coronary artery disease)    a. s/p BMS pRCA 2002; b. DES to pRCA & DES p/m RCA 2006; c. PCI/DES OM4 2007; d. PCI/CBA to RCA for ISR 10/2006; e.08/2012 STEMI/Cath/PCI:  LAD 95% >> PCI: Promus Prem DES  //  f. NSTEMI 10/15 >> LHC: mLAD stent ok, dLAD 70, OM1 CTO, OM2 stent ok, dOM2 90, OM3 30-40, dLCx 90, p-mRCA stent ok, mRCA stent ok w/ 60-70 ISR, EF 50% >> med Rx // MV 1/17: no ischemia // Cath ([facility]) 05/2019: RCA 100 (CTO)   . Combined systolic and diastolic CHF    a. Myoview 1/17: EF 35%  //  b. Echo 1/17 EF 45-50% // Echo 05/2019:  EF 50-55 // Echocardiogram 8/21: EF 50, inf AK, post and mid inf HK, mod LVH, RVSP 25.3, mild to mod LAE, trivial MR   . Depression   . Diabetes mellitus ([place])    a. A1c 8.8 08/2012->Metformin initiated. => b. A1c (9/14): 6.6  . Gastroesophageal reflux disease   . History of nuclear stress test    a. Myoview 1/17: EF 35%, fixed inferior lateral defect consistent with infarct, no ischemia, intermediate risk  . History of pneumonia   . HTN (hypertension)   . Hx of NSTEMI   . Hyperlipidemia   . Ischemic cardiomyopathy    a. EF 40%; improved to normal;  b. 08/2012 Echo: EF 50-55%, mod LVH.//  c. Echo 9/16: Inf HK, mild LVH, EF 55%, mild LAE, normal RVF, mild RAE, PASP 35 mmHg  //  d. Echo 1/17: EF 45-50%, inferior HK, mild BAE, PASP 33 mmHg  . Obesity   .  OSA (obstructive sleep apnea)    Does not use CPAP as of 05/2011  . Tobacco abuse     Current Outpatient Medications on File Prior to Visit  Medication Sig Dispense Refill  . Alcohol Swabs (B-D SINGLE USE SWABS BUTTERFLY) PADS Use up to 4 times a day to test blood sugars. Dx: E10.9, E11.9 100 each 11  . aspirin 81 MG tablet Take 81 mg by mouth daily.    . Blood Glucose Calibration (TRUE METRIX LEVEL 1) Low SOLN Use as needed. Dx: E10.9, E11.9 1 each 0  . blood glucose meter kit and supplies Dispense based on patient and insurance preference. Use up to four times daily as directed. (FOR ICD-10 E10.9, E11.9). 1 each 0  . carvedilol (COREG) 25 MG tablet Take 1 tablet (25 mg total) by mouth 2 (two) times daily with a meal. 180 tablet 3  . clopidogrel (PLAVIX) 75 MG tablet Take 1 tablet (75 mg total) by mouth daily. (Patient not taking: Reported on 06/23/2020) 90 tablet 3  . gabapentin (NEURONTIN) 300 MG capsule Take 1 capsule (300 mg total) by mouth 3 (three) times daily. 270 capsule 1  . glucose blood (TRUE METRIX BLOOD GLUCOSE TEST) test strip Use to test blood sugars up to 4 times a day. DX: E10.9, E.11.9 100 each 12  . isosorbide mononitrate (IMDUR) 30 MG 24 hr tablet Take 3 tablets (90 mg total) by mouth daily. 90 tablet 3  . Lancets 33G MISC Use to test blood sugars up to 4 times a day. DX: E10.9, E11.9 100 each 6  . Multiple Vitamins-Minerals (VITAMIN D3 COMPLETE PO) Take 1 tablet by mouth daily.    . nitroGLYCERIN (NITROSTAT) 0.4 MG SL tablet PLACE 1 TABLET UNDER THE TONGUE EVERY 5 MINUTES AS NEEDED FOR CHEST PAIN 25 tablet 5  . pantoprazole (PROTONIX) 40 MG tablet Take 1 tablet (40 mg total) by mouth daily. 90 tablet 3  . PARoxetine (PAXIL) 20 MG tablet Take 1 tablet (20 mg total) by mouth daily. 90 tablet 3  . potassium chloride (KLOR-CON) 10 MEQ tablet Take 2 tablets by mouth in the morning and 1 tablet by mouth in the evening 270 tablet 3  . rosuvastatin (CRESTOR) 40 MG tablet Take 1  tablet (40 mg total) by mouth daily. 90 tablet 1  . spironolactone (ALDACTONE) 25 MG tablet Take 0.5 tablets (12.5 mg total) by mouth daily. 45 tablet 3  . torsemide (DEMADEX) 20 MG tablet     . traMADol (ULTRAM) 50 MG tablet TAKE 1 TABLET(50 MG) BY MOUTH  EVERY 6 HOURS AS NEEDED 120 tablet 0  . vitamin B-12 (CYANOCOBALAMIN) 1000 MCG tablet Take 1 tablet (1,000 mcg total) by mouth daily. 90 tablet 3   No current facility-administered medications on file prior to visit.    Allergies  Allergen Reactions  . Penicillin G Other (See Comments)    Did it involve swelling of the face/tongue/throat, SOB, or low BP?  N/A Did it involve sudden or severe rash/hives, skin peeling, or any reaction on the inside of your mouth or nose? N/A Did you need to seek medical attention at a hospital or doctor's office? N/A When did it last happen?Child If all above answers are "NO", may proceed with cephalosporin use.  [name] Penicillins Other (See Comments)    Did it involve swelling of the face/tongue/throat, SOB, or low BP? N/A Did it involve sudden or severe rash/hives, skin peeling, or any reaction on the inside of your mouth or nose?N/A Did you need to seek medical attention at a hospital or doctor's office? N/A When did it last happen?Child If all above answers are "NO", may proceed with cephalosporin use.     Assessment/Plan:  1. Hypertension -

## 2020-06-29 ENCOUNTER — Ambulatory Visit: Payer: Medicare HMO

## 2020-06-29 NOTE — Progress Notes (Deleted)
Patient ID: [name] [name]                 DOB: [birth year]                      MRN: [id number]     HPI: [name] [name] is a 65 y.o. male referred of Dr. [name] by [name], PA  to HTN clinic. PMH is significant for CAD s/p PCI to RCA and PL1, STEMI in 08/2012 s/p DES to LAD, NSTEMI 11/2013 s/p DES to [place], NSTEMI 03/2014, HFpEF with EF 50% 01/2020, SVT, PVCs, HTN, HLD, CKD, T2DM, OSA, GERD, and tobacco abuse. ZIO monitor 02/2020 should NSR at 59 bpm, no afib/aflutter, no high-grade heart block, frequent PVCs (6% burden) and supraventricular beats (3.7% burden). He was last seen in cardiology clinic on 05/27/2020 by [name] for SVT follow-up after beta-blocker dose increase in the setting of supraventricular tachycardia. BP 152/80 mmHg during clinic.  During visit, carvedilol was increased to 25 mg BID and was continued on all other medications.  Home BP readings? Is pt checking? Diet and exercise? Current meds -- confirm Amlodipine looks like it was d/c after hospitalization in 2017, same with losartan and clonidine secondary to AKI/hypotension; losartan restarted but d/c June 2021? Scr elevated to 1.76 from December; dehydration? Ask about K supplement - 10 mEq morning and 20 mEq evening  Could restart amlodipine for more BP control Would not start ARB d/t K Other option is hydral  Lipids: add nexlizet, LDL is 80 on rosuvastatin 4m and goal is < 55 PA sent 1/19 and approved through 06/10/21, can give pt copay card   Current HTN/cardiac meds:  carvedilol 25 mg BID spironolactone 12.5 mg daily torsemide 20 mg daily Isosorbide mononitrate 90 mg daily  Previously tried:  amlodipine 10 mg daily benazepril 40 mg daily clonidine 0.2 mg BID losartan 100 mg daily  BP goal: <130/80 mmHg  Family History: The patient's family history includes Cancer in his mother; Coronary artery disease in an other family member; Depression in his mother; Hypertension in his mother; Other in his father.  There is no history of Heart attack or Stroke.  Social History: Former smoker, 15 pack-years  Diet:   Exercise:   Home BP readings:   Labs: 06/23/2020: Na 143, K 5, Scr 1.76 05/27/2020: Na 142, K 4.9, Scr 1.48  Wt Readings from Last 3 Encounters:  06/23/20 (!) 321 lb (145.6 kg)  05/27/20 (!) 336 lb (152.4 kg)  03/22/20 (!) 324 lb (147 kg)   BP Readings from Last 3 Encounters:  06/23/20 (!) 148/92  05/27/20 (!) 152/80  03/22/20 138/90   Pulse Readings from Last 3 Encounters:  06/23/20 67  05/27/20 60  03/22/20 (!) 54    Renal function: Estimated Creatinine Clearance: 64.5 mL/min (A) (by C-G formula based on SCr of 1.76 mg/dL (H)).  Past Medical History:  Diagnosis Date  . CAD (coronary artery disease)    a. s/p BMS pRCA 2002; b. DES to pRCA & DES p/m RCA 2006; c. PCI/DES OM4 2007; d. PCI/CBA to RCA for ISR 10/2006; e.08/2012 STEMI/Cath/PCI:  LAD 95% >> PCI: Promus Prem DES  //  f. NSTEMI 10/15 >> LHC: mLAD stent ok, dLAD 70, OM1 CTO, OM2 stent ok, dOM2 90, OM3 30-40, dLCx 90, p-mRCA stent ok, mRCA stent ok w/ 60-70 ISR, EF 50% >> med Rx // MV 1/17: no ischemia // Cath ([facility]) 05/2019: RCA 100 (CTO)   . Combined systolic and  diastolic CHF    a. Myoview 1/17: EF 35%  //  b. Echo 1/17 EF 45-50% // Echo 05/2019: EF 50-55 // Echocardiogram 8/21: EF 50, inf AK, post and mid inf HK, mod LVH, RVSP 25.3, mild to mod LAE, trivial MR   . Depression   . Diabetes mellitus ([place])    a. A1c 8.8 08/2012->Metformin initiated. => b. A1c (9/14): 6.6  . Gastroesophageal reflux disease   . History of nuclear stress test    a. Myoview 1/17: EF 35%, fixed inferior lateral defect consistent with infarct, no ischemia, intermediate risk  . History of pneumonia   . HTN (hypertension)   . Hx of NSTEMI   . Hyperlipidemia   . Ischemic cardiomyopathy    a. EF 40%; improved to normal;  b. 08/2012 Echo: EF 50-55%, mod LVH.//  c. Echo 9/16: Inf HK, mild LVH, EF 55%, mild LAE, normal RVF, mild RAE, PASP 35  mmHg  //  d. Echo 1/17: EF 45-50%, inferior HK, mild BAE, PASP 33 mmHg  . Obesity   . OSA (obstructive sleep apnea)    Does not use CPAP as of 05/2011  . Tobacco abuse     Current Outpatient Medications on File Prior to Visit  Medication Sig Dispense Refill  . Alcohol Swabs (B-D SINGLE USE SWABS BUTTERFLY) PADS Use up to 4 times a day to test blood sugars. Dx: E10.9, E11.9 100 each 11  . aspirin 81 MG tablet Take 81 mg by mouth daily.    . Blood Glucose Calibration (TRUE METRIX LEVEL 1) Low SOLN Use as needed. Dx: E10.9, E11.9 1 each 0  . blood glucose meter kit and supplies Dispense based on patient and insurance preference. Use up to four times daily as directed. (FOR ICD-10 E10.9, E11.9). 1 each 0  . carvedilol (COREG) 25 MG tablet Take 1 tablet (25 mg total) by mouth 2 (two) times daily with a meal. 180 tablet 3  . clopidogrel (PLAVIX) 75 MG tablet Take 1 tablet (75 mg total) by mouth daily. (Patient not taking: Reported on 06/23/2020) 90 tablet 3  . gabapentin (NEURONTIN) 300 MG capsule Take 1 capsule (300 mg total) by mouth 3 (three) times daily. 270 capsule 1  . glucose blood (TRUE METRIX BLOOD GLUCOSE TEST) test strip Use to test blood sugars up to 4 times a day. DX: E10.9, E.11.9 100 each 12  . isosorbide mononitrate (IMDUR) 30 MG 24 hr tablet Take 3 tablets (90 mg total) by mouth daily. 90 tablet 3  . Lancets 33G MISC Use to test blood sugars up to 4 times a day. DX: E10.9, E11.9 100 each 6  . Multiple Vitamins-Minerals (VITAMIN D3 COMPLETE PO) Take 1 tablet by mouth daily.    . nitroGLYCERIN (NITROSTAT) 0.4 MG SL tablet PLACE 1 TABLET UNDER THE TONGUE EVERY 5 MINUTES AS NEEDED FOR CHEST PAIN 25 tablet 5  . pantoprazole (PROTONIX) 40 MG tablet Take 1 tablet (40 mg total) by mouth daily. 90 tablet 3  . PARoxetine (PAXIL) 20 MG tablet Take 1 tablet (20 mg total) by mouth daily. 90 tablet 3  . potassium chloride (KLOR-CON) 10 MEQ tablet Take 2 tablets by mouth in the morning and 1 tablet  by mouth in the evening 270 tablet 3  . rosuvastatin (CRESTOR) 40 MG tablet Take 1 tablet (40 mg total) by mouth daily. 90 tablet 1  . spironolactone (ALDACTONE) 25 MG tablet Take 0.5 tablets (12.5 mg total) by mouth daily. 45 tablet 3  .  torsemide (DEMADEX) 20 MG tablet     . traMADol (ULTRAM) 50 MG tablet TAKE 1 TABLET(50 MG) BY MOUTH EVERY 6 HOURS AS NEEDED 120 tablet 0  . vitamin B-12 (CYANOCOBALAMIN) 1000 MCG tablet Take 1 tablet (1,000 mcg total) by mouth daily. 90 tablet 3   No current facility-administered medications on file prior to visit.    Allergies  Allergen Reactions  . Penicillin G Other (See Comments)    Did it involve swelling of the face/tongue/throat, SOB, or low BP?  N/A Did it involve sudden or severe rash/hives, skin peeling, or any reaction on the inside of your mouth or nose? N/A Did you need to seek medical attention at a hospital or doctor's office? N/A When did it last happen?Child If all above answers are "NO", may proceed with cephalosporin use.  [name] Penicillins Other (See Comments)    Did it involve swelling of the face/tongue/throat, SOB, or low BP? N/A Did it involve sudden or severe rash/hives, skin peeling, or any reaction on the inside of your mouth or nose?N/A Did you need to seek medical attention at a hospital or doctor's office? N/A When did it last happen?Child If all above answers are "NO", may proceed with cephalosporin use.     Assessment/Plan:  1. Hypertension -

## 2020-07-07 ENCOUNTER — Ambulatory Visit: Payer: Medicare HMO

## 2020-07-08 ENCOUNTER — Ambulatory Visit: Payer: Medicare HMO

## 2020-07-08 ENCOUNTER — Telehealth: Payer: Self-pay

## 2020-07-08 NOTE — Telephone Encounter (Signed)
Left message for the pt to call to reschedule his HTN Clinic and EKG with the triage nurse since he did not show up for his appt today.

## 2020-07-14 ENCOUNTER — Encounter: Payer: Self-pay | Admitting: General Practice

## 2020-08-01 ENCOUNTER — Ambulatory Visit: Payer: Medicare HMO

## 2020-08-03 ENCOUNTER — Encounter: Payer: Self-pay | Admitting: Podiatry

## 2020-08-03 ENCOUNTER — Ambulatory Visit (INDEPENDENT_AMBULATORY_CARE_PROVIDER_SITE_OTHER): Payer: Medicare HMO | Admitting: Podiatry

## 2020-08-03 ENCOUNTER — Other Ambulatory Visit: Payer: Self-pay

## 2020-08-03 DIAGNOSIS — E1142 Type 2 diabetes mellitus with diabetic polyneuropathy: Secondary | ICD-10-CM

## 2020-08-03 DIAGNOSIS — E1122 Type 2 diabetes mellitus with diabetic chronic kidney disease: Secondary | ICD-10-CM | POA: Diagnosis not present

## 2020-08-03 DIAGNOSIS — L84 Corns and callosities: Secondary | ICD-10-CM

## 2020-08-03 DIAGNOSIS — M79675 Pain in left toe(s): Secondary | ICD-10-CM

## 2020-08-03 DIAGNOSIS — N179 Acute kidney failure, unspecified: Secondary | ICD-10-CM

## 2020-08-03 DIAGNOSIS — M79674 Pain in right toe(s): Secondary | ICD-10-CM

## 2020-08-03 DIAGNOSIS — B351 Tinea unguium: Secondary | ICD-10-CM

## 2020-08-03 NOTE — Progress Notes (Signed)
This patient returns to my office for at risk foot care.  This patient requires this care by a professional since this patient will be at risk due to having pre-ulcerative calluses, CKD and diabetic neuropathy and coagulation defect.  Patient is taking plavix.  This patient is unable to cut nails himself since the patient cannot reach his nails.These nails are painful walking and wearing shoes.  This patient presents for at risk foot care today.  Patient has not been seen in over 10 months.  General Appearance  Alert, conversant and in no acute stress.  Vascular  Dorsalis pedis and posterior tibial  pulses are palpable  bilaterally.  Capillary return is within normal limits  bilaterally. Temperature is within normal limits  bilaterally.  Neurologic  Senn-Weinstein monofilament wire test diminished   bilaterally. Muscle power within normal limits bilaterally.  Nails Thick disfigured discolored nails with subungual debris  from hallux to fifth toes bilaterally. No evidence of bacterial infection or drainage bilaterally.  Orthopedic  No limitations of motion  feet .  No crepitus or effusions noted.  No bony pathology or digital deformities noted.  Plantar flexed second metatarsal  B/L.  Skin  normotropic skin  noted bilaterally.  No signs of infections or ulcers noted.   Pre-ulcerous callus sub 2  B/L.  Porokeratosis sub 5th  Left.  Onychomycosis  Pain in right toes  Pain in left toes  Porokeratosis/Pre-ulcerous callus  B/L.  Consent was obtained for treatment procedures.   Mechanical debridement of nails 1-5  bilaterally performed with a nail nipper.  Filed with dremel without incident. Porokeratosis debrided with # 15 blade.    Return office visit     3 months                Told patient to return for periodic foot care and evaluation due to potential at risk complications.   Gardiner Barefoot DPM

## 2020-08-29 ENCOUNTER — Other Ambulatory Visit: Payer: Self-pay

## 2020-08-29 ENCOUNTER — Telehealth: Payer: Self-pay | Admitting: Physician Assistant

## 2020-08-29 ENCOUNTER — Ambulatory Visit (INDEPENDENT_AMBULATORY_CARE_PROVIDER_SITE_OTHER): Payer: Medicare HMO | Admitting: Family

## 2020-08-29 ENCOUNTER — Encounter: Payer: Self-pay | Admitting: Family

## 2020-08-29 ENCOUNTER — Ambulatory Visit (INDEPENDENT_AMBULATORY_CARE_PROVIDER_SITE_OTHER): Payer: Medicare HMO

## 2020-08-29 VITALS — BP 200/100 | HR 72 | Temp 98.7°F | Ht 73.0 in | Wt 320.0 lb

## 2020-08-29 DIAGNOSIS — M25511 Pain in right shoulder: Secondary | ICD-10-CM

## 2020-08-29 DIAGNOSIS — I5032 Chronic diastolic (congestive) heart failure: Secondary | ICD-10-CM | POA: Diagnosis not present

## 2020-08-29 DIAGNOSIS — I10 Essential (primary) hypertension: Secondary | ICD-10-CM | POA: Diagnosis not present

## 2020-08-29 MED ORDER — PREDNISONE 20 MG PO TABS: 20.0000 mg | ORAL_TABLET | Freq: Every day | ORAL | 0 refills | Status: DC

## 2020-08-29 NOTE — Telephone Encounter (Signed)
Pt at PCP today and BP was 200/100.  Upon review of PCP note, pt had not taken his medications this morning.  Spoke with pt and he states that he has since taken his medication and is trying to locate his BP cuff now to check it.  Pt doesn't think he ever started the Torsemide and is unsure of who originally prescribed it.  Asked pt about symptoms this morning and he said recently he has noticed ringing in his ears and increased urination.  Denies swelling, HA. Blurred vision, SOB or CP.   Pt admits to increased salt in diet, including Spam for breakfast.  Advised pt to cut back on salt and continue current medications.  Advised to monitor BP 3-4 times over the next week, at least 2 hours after meds, and call with those readings next week.  Advised I will send to Dr. [name] for review and we would call back if he has any recommendations, otherwise we'll plan to talk to him when he calls with BPs.

## 2020-08-29 NOTE — Telephone Encounter (Signed)
New Message:    Pt wants to know if he should be taking Torsemide? His pressure today was 200/100 when he went to the doctor. His doctor told him to [place].  Pt c/o BP issue: STAT if pt c/o blurred vision, one-sided weakness or slurred speech  1. What are your last 5 BP readings? Only reading he had was today when he went today 200/100  2. Are you having any other symptoms (ex. Dizziness, headache, blurred vision, passed out)? No symptoms  3. What is your BP issue? Blood pressure  Is up

## 2020-08-29 NOTE — Progress Notes (Signed)
[name] [name] is a 65 y.o. male with the following history as recorded in EpicCare:  Patient Active Problem List   Diagnosis Date Noted  . Vitamin D deficiency 04/03/2020  . B12 deficiency 04/03/2020  . Itching 11/22/2019  . Chronic heart failure with preserved ejection fraction (HFpEF) ([place]) 05/13/2019  . Chronic coronary artery disease 05/13/2019  . Elevated troponin 05/13/2019  . Morbid obesity with BMI of 40.0-44.9, adult ([place]) 05/13/2019  . Pre-ulcerative calluses 02/03/2019  . Foot injury, left, initial encounter 11/26/2018  . Chest pain 05/30/2017  . Hypokalemia 05/30/2017  . Allergic rhinitis 12/18/2016  . Lumbar radiculitis 09/05/2016  . Lumbar spinal stenosis 09/05/2016  . CKD (chronic kidney disease), stage III ([place]) 12/29/2015  . Chest pain syndrome 12/15/2015  . Acute kidney injury ([place]) 12/15/2015  . Atypical chest pain 12/15/2015  . Encounter for well adult exam with abnormal findings 06/27/2015  . Chronic low back pain 06/27/2015  . Left lumbar radiculopathy 04/13/2015  . Bilateral plantar fasciitis 04/13/2015  . Essential hypertension 03/09/2015  . OSA (obstructive sleep apnea) 03/27/2014  . Peripheral neuropathy ([place]) 12/29/2013  . External hemorrhoid, thrombosed 12/03/2013  . Sinus bradycardia 11/15/2013  . NSTEMI (non-ST elevated myocardial infarction) ([place]) 11/14/2013  . Chronic combined systolic and diastolic CHF  80/08/4915  . CAD S/P multiple PCI   . Hyperlipidemia   . DM2 (diabetes mellitus, type 2) ([place]) 06/26/2013  . Gait disorder 06/26/2013  . Obesity, Class III, BMI 40-49.9 (morbid obesity) ([place]) 09/10/2012  . Tobacco abuse 09/02/2012  . Ischemic cardiomyopathy- EF 50-55% June 2015   . ST elevation myocardial infarction (STEMI) of anterior wall, initial episode of care ([place]) 09/01/2012  . HEMORRHOIDS 12/26/2009  . Acute prostatitis 12/26/2009  . GANGLION CYST, WRIST, RIGHT 11/28/2009  . COLONIC POLYPS 10/07/2009  . Anxiety with depression  07/21/2009  . DYSPNEA 03/24/2009  . GASTROESOPHAGEAL REFLUX DISEASE 01/27/2007  . PERCUTANEOUS TRANSLUMINAL CORONARY ANGIOPLASTY, HX OF 02/09/2006    Current Outpatient Medications  Medication Sig Dispense Refill  . Alcohol Swabs (B-D SINGLE USE SWABS BUTTERFLY) PADS Use up to 4 times a day to test blood sugars. Dx: E10.9, E11.9 100 each 11  . aspirin 81 MG tablet Take 81 mg by mouth daily.    . Blood Glucose Calibration (TRUE METRIX LEVEL 1) Low SOLN Use as needed. Dx: E10.9, E11.9 1 each 0  . blood glucose meter kit and supplies Dispense based on patient and insurance preference. Use up to four times daily as directed. (FOR ICD-10 E10.9, E11.9). 1 each 0  . carvedilol (COREG) 25 MG tablet Take 1 tablet (25 mg total) by mouth 2 (two) times daily with a meal. 180 tablet 3  . clopidogrel (PLAVIX) 75 MG tablet Take 1 tablet (75 mg total) by mouth daily. 90 tablet 3  . gabapentin (NEURONTIN) 300 MG capsule Take 1 capsule (300 mg total) by mouth 3 (three) times daily. 270 capsule 1  . glucose blood (TRUE METRIX BLOOD GLUCOSE TEST) test strip Use to test blood sugars up to 4 times a day. DX: E10.9, E.11.9 100 each 12  . isosorbide mononitrate (IMDUR) 30 MG 24 hr tablet Take 3 tablets (90 mg total) by mouth daily. 90 tablet 3  . Lancets 33G MISC Use to test blood sugars up to 4 times a day. DX: E10.9, E11.9 100 each 6  . Multiple Vitamins-Minerals (VITAMIN D3 COMPLETE PO) Take 1 tablet by mouth daily.    . nitroGLYCERIN (NITROSTAT) 0.4 MG SL tablet PLACE 1 TABLET  UNDER THE TONGUE EVERY 5 MINUTES AS NEEDED FOR CHEST PAIN 25 tablet 5  . pantoprazole (PROTONIX) 40 MG tablet Take 1 tablet (40 mg total) by mouth daily. 90 tablet 3  . PARoxetine (PAXIL) 20 MG tablet Take 1 tablet (20 mg total) by mouth daily. 90 tablet 3  . potassium chloride (KLOR-CON) 10 MEQ tablet Take 2 tablets by mouth in the morning and 1 tablet by mouth in the evening 270 tablet 3  . predniSONE (DELTASONE) 20 MG tablet Take 1 tablet  (20 mg total) by mouth daily with breakfast. 5 tablet 0  . rosuvastatin (CRESTOR) 40 MG tablet Take 1 tablet (40 mg total) by mouth daily. 90 tablet 1  . spironolactone (ALDACTONE) 25 MG tablet Take 0.5 tablets (12.5 mg total) by mouth daily. 45 tablet 3  . torsemide (DEMADEX) 20 MG tablet     . traMADol (ULTRAM) 50 MG tablet TAKE 1 TABLET(50 MG) BY MOUTH EVERY 6 HOURS AS NEEDED 120 tablet 0  . vitamin B-12 (CYANOCOBALAMIN) 1000 MCG tablet Take 1 tablet (1,000 mcg total) by mouth daily. 90 tablet 3   No current facility-administered medications for this visit.    Allergies: Penicillin g and Penicillins  Past Medical History:  Diagnosis Date  . CAD (coronary artery disease)    a. s/p BMS pRCA 2002; b. DES to pRCA & DES p/m RCA 2006; c. PCI/DES OM4 2007; d. PCI/CBA to RCA for ISR 10/2006; e.08/2012 STEMI/Cath/PCI:  LAD 95% >> PCI: Promus Prem DES  //  f. NSTEMI 10/15 >> LHC: mLAD stent ok, dLAD 70, OM1 CTO, OM2 stent ok, dOM2 90, OM3 30-40, dLCx 90, p-mRCA stent ok, mRCA stent ok w/ 60-70 ISR, EF 50% >> med Rx // MV 1/17: no ischemia // Cath ([facility]) 05/2019: RCA 100 (CTO)   . Combined systolic and diastolic CHF    a. Myoview 1/17: EF 35%  //  b. Echo 1/17 EF 45-50% // Echo 05/2019: EF 50-55 // Echocardiogram 8/21: EF 50, inf AK, post and mid inf HK, mod LVH, RVSP 25.3, mild to mod LAE, trivial MR   . Depression   . Diabetes mellitus ([place])    a. A1c 8.8 08/2012->Metformin initiated. => b. A1c (9/14): 6.6  . Gastroesophageal reflux disease   . History of nuclear stress test    a. Myoview 1/17: EF 35%, fixed inferior lateral defect consistent with infarct, no ischemia, intermediate risk  . History of pneumonia   . HTN (hypertension)   . Hx of NSTEMI   . Hyperlipidemia   . Ischemic cardiomyopathy    a. EF 40%; improved to normal;  b. 08/2012 Echo: EF 50-55%, mod LVH.//  c. Echo 9/16: Inf HK, mild LVH, EF 55%, mild LAE, normal RVF, mild RAE, PASP 35 mmHg  //  d. Echo 1/17: EF 45-50%, inferior HK, mild  BAE, PASP 33 mmHg  . Obesity   . OSA (obstructive sleep apnea)    Does not use CPAP as of 05/2011  . Tobacco abuse     Past Surgical History:  Procedure Laterality Date  . CORONARY ANGIOPLASTY WITH STENT PLACEMENT    . CORONARY STENT PLACEMENT  2009  . LEFT HEART CATHETERIZATION WITH CORONARY ANGIOGRAM N/A 08/31/2012   Procedure: LEFT HEART CATHETERIZATION WITH CORONARY ANGIOGRAM;  Surgeon: Burnell Blanks, MD;  Location: [facility] CATH LAB;  Service: Cardiovascular;  Laterality: N/A;  . LEFT HEART CATHETERIZATION WITH CORONARY ANGIOGRAM N/A 11/16/2013   Procedure: LEFT HEART CATHETERIZATION WITH CORONARY ANGIOGRAM;  Surgeon: Legrand Como  Ree Kida, MD;  Location: [facility] CATH LAB;  Service: Cardiovascular;  Laterality: N/A;  . LEFT HEART CATHETERIZATION WITH CORONARY ANGIOGRAM N/A 03/15/2014   Procedure: LEFT HEART CATHETERIZATION WITH CORONARY ANGIOGRAM;  Surgeon: Peter M Martinique, MD;  Location: [facility] CATH LAB;  Service: Cardiovascular;  Laterality: N/A;  . PERCUTANEOUS CORONARY STENT INTERVENTION (PCI-S) Right 08/31/2012   Procedure: PERCUTANEOUS CORONARY STENT INTERVENTION (PCI-S);  Surgeon: Burnell Blanks, MD;  [facility] LAB;  Service: Cardiovascular;  Laterality: Right;  . PERCUTANEOUS CORONARY STENT INTERVENTION (PCI-S)  11/16/2013   Procedure: PERCUTANEOUS CORONARY STENT INTERVENTION (PCI-S);  Surgeon: Blane Ohara, MD;  Location: [facility] CATH LAB;  Service: Cardiovascular;;    Family History  Problem Relation Age of Onset  . Depression Mother   . Cancer Mother        ovarian  . Hypertension Mother        Died, 83  . Other Father        motor vehicle accident  . Coronary artery disease Other   . Heart attack Neg Hx   . Stroke Neg Hx     Social History   Tobacco Use  . Smoking status: Former Smoker    Packs/day: 0.50    Years: 30.00    Pack years: 15.00    Types: Cigarettes  . Smokeless tobacco: Never Used  . Tobacco comment: trying to cut down  Substance Use Topics  .  Alcohol use: No    Subjective:  1 week history of right shoulder pain- limited range of motion but does feel that it has been getting better but still painful; has been taking Ibuprofen and Tylenol; symptoms seemed to start after lifting a car battery;  Also very surprised to see that his blood pressure is so elevated today; notes he has not taken his medication this morning; "having a bad morning." Does mention that he has been more short of breath in the past 1-2 months; is confused about what medications he is actually supposed to be taking;      Objective:  Vitals:   08/29/20 0827 08/29/20 0849  BP: (!) 190/110 (!) 200/100  Pulse: 72   Temp: 98.7 F (37.1 C)   TempSrc: Oral   SpO2: 96%   Weight: (!) 320 lb (145.2 kg)   Height: _0  (1.854 m)     General: Well developed, well nourished, in no acute distress  Skin : Warm and dry.  Head: Normocephalic and atraumatic  Eyes: Sclera and conjunctiva clear; pupils round and reactive to light; extraocular movements intact  Ears: External normal; canals clear; tympanic membranes normal  Oropharynx: Pink, supple. No suspicious lesions  Neck: Supple without thyromegaly, adenopathy  Lungs: Respirations unlabored;  CVS exam: normal rate and regular rhythm.  Neurologic: Alert and oriented; speech intact; face symmetrical; moves all extremities well; CNII-XII intact without focal deficit   Assessment:  1. Acute pain of right shoulder   2. Essential hypertension   3. Chronic heart failure with preserved ejection fraction (HFpEF) ([place])     Plan:  1. Update right shoulder X-ray; Rx for Prednisone 20 mg qd x 5 days; instructed to stop taking Ibuprofen; follow-up to be determined- may need to see sports medicine if symptoms persist; 2. & 3. Extensive medication review done with patient today- he has not taken his medications this morning and notes he has not been on Toresemide "for a while." He is asked to call his cardiologist to get his  medications clarified; explained to  him that notes indicate the should be taking 4 different medications for his blood pressure;   Time spent with medication review/ treatment plan 30 minutes  No follow-ups on file.  Orders Placed This Encounter  Procedures  . DG Shoulder Right    Standing Status:   Future    Standing Expiration Date:   08/29/2021    Order Specific Question:   Reason for Exam (SYMPTOM  OR DIAGNOSIS REQUIRED)    Answer:   right shoulder xray    Order Specific Question:   Preferred imaging location?    Answer:   [facility]    Requested Prescriptions   Signed Prescriptions Disp Refills  . predniSONE (DELTASONE) 20 MG tablet 5 tablet 0    Sig: Take 1 tablet (20 mg total) by mouth daily with breakfast.

## 2020-08-29 NOTE — Patient Instructions (Addendum)
Please call cardiology to get your medications clarified- [phone]  According to records, it looks like you should be taking:  1) Carvedilol (coreg) 25 mg twice a day;  2) Isosorbide Mononitrate 30 mg 3 tablets daily;  3) Spironolactone (Aldactone) 25 mg Take 1/2 tablet daily; 4) Torsemide ( Demadex) 20 mg daily ( THIS IS THE ONE IN QUESTION)

## 2020-08-29 NOTE — Telephone Encounter (Signed)
Agree with plan as outlined. thanks

## 2020-09-08 ENCOUNTER — Other Ambulatory Visit: Payer: Self-pay

## 2020-09-08 ENCOUNTER — Telehealth: Payer: Self-pay

## 2020-09-08 DIAGNOSIS — E1159 Type 2 diabetes mellitus with other circulatory complications: Secondary | ICD-10-CM

## 2020-09-08 MED ORDER — TRUE METRIX BLOOD GLUCOSE TEST VI STRP: ORAL_STRIP | 12 refills | Status: DC

## 2020-09-08 MED ORDER — LANCETS 33G MISC: 6 refills | Status: DC

## 2020-09-08 MED ORDER — BD SWABS SINGLE USE BUTTERFLY PADS: MEDICATED_PAD | 11 refills | Status: DC

## 2020-09-09 ENCOUNTER — Ambulatory Visit (INDEPENDENT_AMBULATORY_CARE_PROVIDER_SITE_OTHER): Payer: Medicare HMO

## 2020-09-09 ENCOUNTER — Other Ambulatory Visit: Payer: Self-pay | Admitting: Internal Medicine

## 2020-09-09 ENCOUNTER — Other Ambulatory Visit: Payer: Self-pay

## 2020-09-09 VITALS — BP 150/80 | HR 64 | Temp 98.0°F | Ht 73.0 in | Wt 315.8 lb

## 2020-09-09 DIAGNOSIS — Z0000 Encounter for general adult medical examination without abnormal findings: Secondary | ICD-10-CM

## 2020-09-09 MED ORDER — TRUE METRIX LEVEL 1 LOW VI SOLN: 0 refills | Status: DC

## 2020-09-09 NOTE — Patient Instructions (Signed)
[name] [name] , Thank you for taking time to come for your Medicare Wellness Visit. I appreciate your ongoing commitment to your health goals. Please review the following plan we discussed and let me know if I can assist you in the future.   Screening recommendations/referrals: Colonoscopy: 03/17/2012; due every 10 years Recommended yearly ophthalmology/optometry visit for glaucoma screening and checkup Recommended yearly dental visit for hygiene and checkup  Vaccinations: Influenza vaccine: 03/22/2020 Pneumococcal vaccine: 06/26/2013, 06/27/2015 Tdap vaccine: 03/24/2009; due every 10 years Shingles vaccine: 04/02/2017   Covid-19: 11/10/2019, 11/03/2019, 06/12/2020  Advanced directives: Advance directive discussed with you today. Even though you declined this today please call our office should you change your mind and we can give you the proper paperwork for you to fill out.  Conditions/risks identified: Yes; Reviewed health maintenance screenings with patient today and relevant education, vaccines, and/or referrals were provided. Please continue to do your personal lifestyle choices by: daily care of teeth and gums, regular physical activity (goal should be 5 days a week for 30 minutes), eat a healthy diet, avoid tobacco and drug use, limiting any alcohol intake, taking a low-dose aspirin (if not allergic or have been advised by your provider otherwise) and taking vitamins and minerals as recommended by your provider. Continue doing brain stimulating activities (puzzles, reading, adult coloring books, staying active) to keep memory sharp. Continue to eat heart healthy diet (full of fruits, vegetables, whole grains, lean protein, water--limit salt, fat, and sugar intake) and increase physical activity as tolerated.  Next appointment: Please schedule your next Medicare Wellness Visit with your Nurse Health Advisor in 1 year by calling [phone].  Preventive Care 40-64 Years, Male Preventive care refers  to lifestyle choices and visits with your health care provider that can promote health and wellness. What does preventive care include?  A yearly physical exam. This is also called an annual well check.  Dental exams once or twice a year.  Routine eye exams. Ask your health care provider how often you should have your eyes checked.  Personal lifestyle choices, including:  Daily care of your teeth and gums.  Regular physical activity.  Eating a healthy diet.  Avoiding tobacco and drug use.  Limiting alcohol use.  Practicing safe sex.  Taking low-dose aspirin every day starting at age 11. What happens during an annual well check? The services and screenings done by your health care provider during your annual well check will depend on your age, overall health, lifestyle risk factors, and family history of disease. Counseling  Your health care provider may ask you questions about your:  Alcohol use.  Tobacco use.  Drug use.  Emotional well-being.  Home and relationship well-being.  Sexual activity.  Eating habits.  Work and work Statistician. Screening  You may have the following tests or measurements:  Height, weight, and BMI.  Blood pressure.  Lipid and cholesterol levels. These may be checked every 5 years, or more frequently if you are over 36 years old.  Skin check.  Lung cancer screening. You may have this screening every year starting at age 52 if you have a 30-pack-year history of smoking and currently smoke or have quit within the past 15 years.  Fecal occult blood test (FOBT) of the stool. You may have this test every year starting at age 50.  Flexible sigmoidoscopy or colonoscopy. You may have a sigmoidoscopy every 5 years or a colonoscopy every 10 years starting at age 71.  Prostate cancer screening. Recommendations will vary depending  on your family history and other risks.  Hepatitis C blood test.  Hepatitis B blood test.  Sexually  transmitted disease (STD) testing.  Diabetes screening. This is done by checking your blood sugar (glucose) after you have not eaten for a while (fasting). You may have this done every 1-3 years. Discuss your test results, treatment options, and if necessary, the need for more tests with your health care provider. Vaccines  Your health care provider may recommend certain vaccines, such as:  Influenza vaccine. This is recommended every year.  Tetanus, diphtheria, and acellular pertussis (Tdap, Td) vaccine. You may need a Td booster every 10 years.  Zoster vaccine. You may need this after age 37.  Pneumococcal 13-valent conjugate (PCV13) vaccine. You may need this if you have certain conditions and have not been vaccinated.  Pneumococcal polysaccharide (PPSV23) vaccine. You may need one or two doses if you smoke cigarettes or if you have certain conditions. Talk to your health care provider about which screenings and vaccines you need and how often you need them. This information is not intended to replace advice given to you by your health care provider. Make sure you discuss any questions you have with your health care provider. Document Released: 06/24/2015 Document Revised: 02/15/2016 Document Reviewed: 03/29/2015 Elsevier Interactive Patient Education  2017 [employer] Prevention in the Home Falls can cause injuries. They can happen to people of all ages. There are many things you can do to make your home safe and to help prevent falls. What can I do on the outside of my home?  Regularly fix the edges of walkways and driveways and fix any cracks.  Remove anything that might make you trip as you walk through a door, such as a raised step or threshold.  Trim any bushes or trees on the path to your home.  Use bright outdoor lighting.  Clear any walking paths of anything that might make someone trip, such as rocks or tools.  Regularly check to see if handrails are loose or  broken. Make sure that both sides of any steps have handrails.  Any raised decks and porches should have guardrails on the edges.  Have any leaves, snow, or ice cleared regularly.  Use sand or salt on walking paths during winter.  Clean up any spills in your garage right away. This includes oil or grease spills. What can I do in the bathroom?  Use night lights.  Install grab bars by the toilet and in the tub and shower. Do not use towel bars as grab bars.  Use non-skid mats or decals in the tub or shower.  If you need to sit down in the shower, use a plastic, non-slip stool.  Keep the floor dry. Clean up any water that spills on the floor as soon as it happens.  Remove soap buildup in the tub or shower regularly.  Attach bath mats securely with double-sided non-slip rug tape.  Do not have throw rugs and other things on the floor that can make you trip. What can I do in the bedroom?  Use night lights.  Make sure that you have a light by your bed that is easy to reach.  Do not use any sheets or blankets that are too big for your bed. They should not hang down onto the floor.  Have a firm chair that has side arms. You can use this for support while you get dressed.  Do not have throw rugs and other  things on the floor that can make you trip. What can I do in the kitchen?  Clean up any spills right away.  Avoid walking on wet floors.  Keep items that you use a lot in easy-to-reach places.  If you need to reach something above you, use a strong step stool that has a grab bar.  Keep electrical cords out of the way.  Do not use floor polish or wax that makes floors slippery. If you must use wax, use non-skid floor wax.  Do not have throw rugs and other things on the floor that can make you trip. What can I do with my stairs?  Do not leave any items on the stairs.  Make sure that there are handrails on both sides of the stairs and use them. Fix handrails that are  broken or loose. Make sure that handrails are as long as the stairways.  Check any carpeting to make sure that it is firmly attached to the stairs. Fix any carpet that is loose or worn.  Avoid having throw rugs at the top or bottom of the stairs. If you do have throw rugs, attach them to the floor with carpet tape.  Make sure that you have a light switch at the top of the stairs and the bottom of the stairs. If you do not have them, ask someone to add them for you. What else can I do to help prevent falls?  Wear shoes that:  Do not have high heels.  Have rubber bottoms.  Are comfortable and fit you well.  Are closed at the toe. Do not wear sandals.  If you use a stepladder:  Make sure that it is fully opened. Do not climb a closed stepladder.  Make sure that both sides of the stepladder are locked into place.  Ask someone to hold it for you, if possible.  Clearly mark and make sure that you can see:  Any grab bars or handrails.  First and last steps.  Where the edge of each step is.  Use tools that help you move around (mobility aids) if they are needed. These include:  Canes.  Walkers.  Scooters.  Crutches.  Turn on the lights when you go into a dark area. Replace any light bulbs as soon as they burn out.  Set up your furniture so you have a clear path. Avoid moving your furniture around.  If any of your floors are uneven, fix them.  If there are any pets around you, be aware of where they are.  Review your medicines with your doctor. Some medicines can make you feel dizzy. This can increase your chance of falling. Ask your doctor what other things that you can do to help prevent falls. This information is not intended to replace advice given to you by your health care provider. Make sure you discuss any questions you have with your health care provider. Document Released: 03/24/2009 Document Revised: 11/03/2015 Document Reviewed: 07/02/2014 Elsevier  Interactive Patient Education  2017 [employer].

## 2020-09-09 NOTE — Telephone Encounter (Signed)
[facility] called and said that they have not received orders for the True Metrix Meter and the True Metrix controlled solution level one. Please advise    [phone]

## 2020-09-09 NOTE — Addendum Note (Signed)
Addended by: Biagio Borg on: 09/09/2020 09:33 PM   Modules accepted: Orders

## 2020-09-09 NOTE — Progress Notes (Signed)
Subjective:   [name] [name] is a 65 y.o. male who presents for Medicare Annual/Subsequent preventive examination.  Review of Systems    No ROS. Medicare Wellness Visit. Additional risk factors are reflected in social history. Cardiac Risk Factors include: advanced age (>6men, >26 women);diabetes mellitus;dyslipidemia;family history of premature cardiovascular disease;hypertension;male gender;obesity (BMI >30kg/m2)     Objective:    Today's Vitals   09/09/20 1107  BP: (!) 150/80  Pulse: 64  Temp: 98 F (36.7 C)  SpO2: 92%  Weight: (!) 315 lb 12.8 oz (143.2 kg)  Height: $Remove'6\' 1"'[id number]$  (1.854 m)   Body mass index is 41.66 kg/m.  Advanced Directives 09/09/2020 08/25/2019 05/30/2017 05/29/2017 03/29/2017 12/20/2015 12/15/2015  Does Patient Have a Medical Advance Directive? No No No No No No No  Would patient like information on creating a medical advance directive? No - Patient declined No - Patient declined No - Patient declined No - Patient declined Yes (ED - Information included in AVS) No - patient declined information No - patient declined information  Pre-existing out of facility DNR order (yellow form or pink MOST form) - - - - - - -    Current Medications (verified) Outpatient Encounter Medications as of 09/09/2020  Medication Sig  . Alcohol Swabs (B-D SINGLE USE SWABS BUTTERFLY) PADS Use up to 4 times a day to test blood sugars. Dx: E10.9, E11.9  . aspirin 81 MG tablet Take 81 mg by mouth daily.  . Blood Glucose Calibration (TRUE METRIX LEVEL 1) Low SOLN Use as needed. Dx: E10.9, E11.9  . blood glucose meter kit and supplies Dispense based on patient and insurance preference. Use up to four times daily as directed. (FOR ICD-10 E10.9, E11.9).  . carvedilol (COREG) 25 MG tablet Take 1 tablet (25 mg total) by mouth 2 (two) times daily with a meal.  . clopidogrel (PLAVIX) 75 MG tablet Take 1 tablet (75 mg total) by mouth daily.  [name] gabapentin (NEURONTIN) 300 MG capsule Take 1 capsule  (300 mg total) by mouth 3 (three) times daily.  [name] glucose blood (TRUE METRIX BLOOD GLUCOSE TEST) test strip Use to test blood sugars up to 4 times a day. DX: E10.9, E.11.9  . isosorbide mononitrate (IMDUR) 30 MG 24 hr tablet Take 3 tablets (90 mg total) by mouth daily.  . Lancets 33G MISC Use to test blood sugars up to 4 times a day. DX: E10.9, E11.9  . Multiple Vitamins-Minerals (VITAMIN D3 COMPLETE PO) Take 1 tablet by mouth daily.  . nitroGLYCERIN (NITROSTAT) 0.4 MG SL tablet PLACE 1 TABLET UNDER THE TONGUE EVERY 5 MINUTES AS NEEDED FOR CHEST PAIN  . pantoprazole (PROTONIX) 40 MG tablet Take 1 tablet (40 mg total) by mouth daily.  [name] PARoxetine (PAXIL) 20 MG tablet Take 1 tablet (20 mg total) by mouth daily.  . potassium chloride (KLOR-CON) 10 MEQ tablet Take 2 tablets by mouth in the morning and 1 tablet by mouth in the evening  . predniSONE (DELTASONE) 20 MG tablet Take 1 tablet (20 mg total) by mouth daily with breakfast.  . rosuvastatin (CRESTOR) 40 MG tablet Take 1 tablet (40 mg total) by mouth daily.  [name] spironolactone (ALDACTONE) 25 MG tablet Take 0.5 tablets (12.5 mg total) by mouth daily.  . traMADol (ULTRAM) 50 MG tablet TAKE 1 TABLET(50 MG) BY MOUTH EVERY 6 HOURS AS NEEDED  . vitamin B-12 (CYANOCOBALAMIN) 1000 MCG tablet Take 1 tablet (1,000 mcg total) by mouth daily.   No facility-administered encounter medications on file  as of 09/09/2020.    Allergies (verified) Penicillin g and Penicillins   History: Past Medical History:  Diagnosis Date  . CAD (coronary artery disease)    a. s/p BMS pRCA 2002; b. DES to pRCA & DES p/m RCA 2006; c. PCI/DES OM4 2007; d. PCI/CBA to RCA for ISR 10/2006; e.08/2012 STEMI/Cath/PCI:  LAD 95% >> PCI: Promus Prem DES  //  f. NSTEMI 10/15 >> LHC: mLAD stent ok, dLAD 70, OM1 CTO, OM2 stent ok, dOM2 90, OM3 30-40, dLCx 90, p-mRCA stent ok, mRCA stent ok w/ 60-70 ISR, EF 50% >> med Rx // MV 1/17: no ischemia // Cath ([facility]) 05/2019: RCA 100 (CTO)   . Combined  systolic and diastolic CHF    a. Myoview 1/17: EF 35%  //  b. Echo 1/17 EF 45-50% // Echo 05/2019: EF 50-55 // Echocardiogram 8/21: EF 50, inf AK, post and mid inf HK, mod LVH, RVSP 25.3, mild to mod LAE, trivial MR   . Depression   . Diabetes mellitus (HCC)    a. A1c 8.8 08/2012->Metformin initiated. => b. A1c (9/14): 6.6  . Gastroesophageal reflux disease   . History of nuclear stress test    a. Myoview 1/17: EF 35%, fixed inferior lateral defect consistent with infarct, no ischemia, intermediate risk  . History of pneumonia   . HTN (hypertension)   . Hx of NSTEMI   . Hyperlipidemia   . Ischemic cardiomyopathy    a. EF 40%; improved to normal;  b. 08/2012 Echo: EF 50-55%, mod LVH.//  c. Echo 9/16: Inf HK, mild LVH, EF 55%, mild LAE, normal RVF, mild RAE, PASP 35 mmHg  //  d. Echo 1/17: EF 45-50%, inferior HK, mild BAE, PASP 33 mmHg  . Obesity   . OSA (obstructive sleep apnea)    Does not use CPAP as of 05/2011  . Tobacco abuse    Past Surgical History:  Procedure Laterality Date  . CORONARY ANGIOPLASTY WITH STENT PLACEMENT    . CORONARY STENT PLACEMENT  2009  . LEFT HEART CATHETERIZATION WITH CORONARY ANGIOGRAM N/A 08/31/2012   Procedure: LEFT HEART CATHETERIZATION WITH CORONARY ANGIOGRAM;  Surgeon: Kathleene Hazel, MD;  Location: [facility] CATH LAB;  Service: Cardiovascular;  Laterality: N/A;  . LEFT HEART CATHETERIZATION WITH CORONARY ANGIOGRAM N/A 11/16/2013   Procedure: LEFT HEART CATHETERIZATION WITH CORONARY ANGIOGRAM;  Surgeon: Micheline Chapman, MD;  Location: [facility] CATH LAB;  Service: Cardiovascular;  Laterality: N/A;  . LEFT HEART CATHETERIZATION WITH CORONARY ANGIOGRAM N/A 03/15/2014   Procedure: LEFT HEART CATHETERIZATION WITH CORONARY ANGIOGRAM;  Surgeon: Peter M Swaziland, MD;  Location: [facility] CATH LAB;  Service: Cardiovascular;  Laterality: N/A;  . PERCUTANEOUS CORONARY STENT INTERVENTION (PCI-S) Right 08/31/2012   Procedure: PERCUTANEOUS CORONARY STENT INTERVENTION (PCI-S);  Surgeon:  Kathleene Hazel, MD;  Location: [facility] CATH LAB;  Service: Cardiovascular;  Laterality: Right;  . PERCUTANEOUS CORONARY STENT INTERVENTION (PCI-S)  11/16/2013   Procedure: PERCUTANEOUS CORONARY STENT INTERVENTION (PCI-S);  Surgeon: Micheline Chapman, MD;  Location: [facility] CATH LAB;  Service: Cardiovascular;;   Family History  Problem Relation Age of Onset  . Depression Mother   . Cancer Mother        ovarian  . Hypertension Mother        Died, 64  . Other Father        motor vehicle accident  . Coronary artery disease Other   . Heart attack Neg Hx   . Stroke Neg Hx    Social History  Socioeconomic History  . Marital status: Single    Spouse name: Not on file  . Number of children: 0  . Years of education: Not on file  . Highest education level: Not on file  Occupational History  . Occupation: retired  Tobacco Use  . Smoking status: Former Smoker    Packs/day: 0.50    Years: 30.00    Pack years: 15.00    Types: Cigarettes  . Smokeless tobacco: Never Used  . Tobacco comment: trying to cut down  Vaping Use  . Vaping Use: Never used  Substance and Sexual Activity  . Alcohol use: No  . Drug use: No    Comment: remote marijuana use  . Sexual activity: Not Currently  Other Topics Concern  . Not on file  Social History Narrative   Lives alone.  He has no children.   He retired early due to health problems.         Social Determinants of Health   Financial Resource Strain: Low Risk   . Difficulty of Paying Living Expenses: Not hard at all  Food Insecurity: No Food Insecurity  . Worried About Charity fundraiser in the Last Year: Never true  . Ran Out of Food in the Last Year: Never true  Transportation Needs: No Transportation Needs  . Lack of Transportation (Medical): No  . Lack of Transportation (Non-Medical): No  Physical Activity: Inactive  . Days of Exercise per Week: 0 days  . Minutes of Exercise per Session: 0 min  Stress: No Stress Concern Present  .  Feeling of Stress : Not at all  Social Connections: Not on file    Tobacco Counseling Counseling given: Not Answered Comment: trying to cut down   Clinical Intake:  Pre-visit preparation completed: Yes  Pain : No/denies pain     BMI - recorded: 41.66 Nutritional Status: BMI > 30  Obese Nutritional Risks: None Diabetes: Yes CBG done?: No Did pt. bring in CBG monitor from home?: No  How often do you need to have someone help you when you read instructions, pamphlets, or other written materials from your doctor or pharmacy?: 1 - Never What is the last grade level you completed in school?: 11th grade  Diabetic? yes  Interpreter Needed?: No  Information entered by :: Lisette Abu, LPN   Activities of Daily Living In your present state of health, do you have any difficulty performing the following activities: 09/09/2020 03/22/2020  Hearing? Y N  Vision? Y N  Difficulty concentrating or making decisions? Y N  Walking or climbing stairs? Y N  Dressing or bathing? N N  Doing errands, shopping? N N  Preparing Food and eating ? N -  Using the Toilet? N -  In the past six months, have you accidently leaked urine? Y -  Do you have problems with loss of bowel control? N -  Managing your Medications? N -  Managing your Finances? N -  Housekeeping or managing your Housekeeping? N -  Some recent data might be hidden    Patient Care Team: Biagio Borg, MD as PCP - General (Internal Medicine) Sherren Mocha, MD as PCP - Cardiology (Cardiology) Sherren Mocha, MD as Consulting Physician (Cardiology) Sharmon Revere as Physician Assistant (Cardiology) Sueanne Margarita, MD as Consulting Physician (Sleep Medicine)  Indicate any recent Medical Services you may have received from other than [facility] providers in the past year (date may be approximate).     Assessment:  This is a routine wellness examination for [facility].  Hearing/Vision screen No exam data  present  Dietary issues and exercise activities discussed: Current Exercise Habits: The patient does not participate in regular exercise at present, Exercise limited by: cardiac condition(s);respiratory conditions(s);psychological condition(s);orthopedic condition(s)  Goals    . Reduce sodium intake     Case Manager discussed ways to reduce sodium intake with patient when cooking.  Suggestions:  Low sodium broth to cook vegetables.  Use Mrs. Dash to season and or marinate meats instead of seasoning salt and/or meat tenderizer.    . Start to eat healthy and exercise.     Start to read labels, do portion control, increase the amount of water I drink. Increase the amount I socialize, start to walk around my neighborhood. Check into going to the [facility].      Depression Screen PHQ 2/9 Scores 09/09/2020 06/23/2020 06/23/2020 03/22/2020 11/20/2019 11/20/2019 12/23/2018  PHQ - 2 Score 2 2 0 $R'2 2 2 2  'ex$ PHQ- 9 Score - 13 - 2 - 7 -    Fall Risk Fall Risk  09/09/2020 08/29/2020 06/23/2020 03/22/2020 11/20/2019  Falls in the past year? 0 0 0 0 0  Number falls in past yr: 0 0 - 0 -  Injury with Fall? 0 0 - 0 -  Risk Factor Category  - - - - -  Risk for fall due to : No Fall Risks - - No Fall Risks -  Risk for fall due to: Comment - - - - -  Follow up Falls evaluation completed Falls evaluation completed - Falls evaluation completed -    FALL RISK PREVENTION PERTAINING TO THE HOME:  Any stairs in or around the home? No  If so, are there any without handrails? No  Home free of loose throw rugs in walkways, pet beds, electrical cords, etc? Yes  Adequate lighting in your home to reduce risk of falls? Yes   ASSISTIVE DEVICES UTILIZED TO PREVENT FALLS:  Life alert? No  Use of a cane, walker or w/c? Yes  Grab bars in the bathroom? No  Shower chair or bench in shower? No  Elevated toilet seat or a handicapped toilet? No   TIMED UP AND GO:  Was the test performed? No .  Length of time to ambulate 10  feet: 0 sec.   Gait steady and fast with assistive device  Cognitive Function: Normal cognitive status assessed by direct observation by this Nurse Health Advisor. No abnormalities found.   MMSE - Mini Mental State Exam 03/29/2017  Orientation to time 5  Orientation to Place 5  Registration 3  Attention/ Calculation 5  Recall 2  Language- name 2 objects 2  Language- repeat 1  Language- follow 3 step command 3  Language- read & follow direction 1  Write a sentence 1  Copy design 1  Total score 29        Immunizations Immunization History  Administered Date(s) Administered  . Influenza Whole 04/12/2008, 04/12/2010, 02/10/2011  . Influenza, Seasonal, Injecte, Preservative Fre 03/11/2013  . Influenza,inj,Quad PF,6+ Mos 02/12/2015, 05/04/2016, 03/29/2017, 05/05/2018, 05/15/2019, 03/22/2020  . Moderna Sars-Covid-2 Vaccination 11/10/2019, 12/07/2019, 05/16/2020  . PFIZER(Purple Top)SARS-COV-2 Vaccination 11/10/2019  . Pneumococcal Conjugate-13 06/26/2013  . Pneumococcal Polysaccharide-23 07/21/2009, 06/27/2015  . Td 03/24/2009  . Zoster Recombinat (Shingrix) 04/02/2017    TDAP status: Due, Education has been provided regarding the importance of this vaccine. Advised may receive this vaccine at local pharmacy or Health Dept. Aware to provide a  copy of the vaccination record if obtained from local pharmacy or Health Dept. Verbalized acceptance and understanding.  Flu Vaccine status: Up to date  Pneumococcal vaccine status: Up to date  Covid-19 vaccine status: Completed vaccines  Qualifies for Shingles Vaccine? Yes   Zostavax completed No   Shingrix Completed?: No.    Education has been provided regarding the importance of this vaccine. Patient has been advised to call insurance company to determine out of pocket expense if they have not yet received this vaccine. Advised may also receive vaccine at local pharmacy or Health Dept. Verbalized acceptance and  understanding.  Screening Tests Health Maintenance  Topic Date Due  . OPHTHALMOLOGY EXAM  Never done  . TETANUS/TDAP  11/19/2020 (Originally 03/25/2019)  . HEMOGLOBIN A1C  12/21/2020  . INFLUENZA VACCINE  01/09/2021  . URINE MICROALBUMIN  06/23/2021  . FOOT EXAM  08/03/2021  . COLONOSCOPY (Pts 45-71yrs Insurance coverage will need to be confirmed)  03/17/2022  . PNEUMOCOCCAL POLYSACCHARIDE VACCINE AGE 25-64 HIGH RISK  Completed  . COVID-19 Vaccine  Completed  . Hepatitis C Screening  Completed  . HIV Screening  Completed  . HPV VACCINES  Aged Out    Health Maintenance  Health Maintenance Due  Topic Date Due  . OPHTHALMOLOGY EXAM  Never done    Colorectal cancer screening: Type of screening: Colonoscopy. Completed 03/17/2012. Repeat every 10 years  Lung Cancer Screening: (Low Dose CT Chest recommended if Age 41-80 years, 30 pack-year currently smoking OR have quit w/in 15years.) does qualify.   Lung Cancer Screening Referral: no  Additional Screening:  Hepatitis C Screening: does qualify; Completed yes  Vision Screening: Recommended annual ophthalmology exams for early detection of glaucoma and other disorders of the eye. Is the patient up to date with their annual eye exam?  No  Who is the provider or what is the name of the office in which the patient attends annual eye exams? N/A If pt is not established with a provider, would they like to be referred to a provider to establish care? Yes .   Dental Screening: Recommended annual dental exams for proper oral hygiene  Community Resource Referral / Chronic Care Management: CRR required this visit?  Yes   CCM required this visit?  No      Plan:     I have personally reviewed and noted the following in the patient's chart:   . Medical and social history . Use of alcohol, tobacco or illicit drugs  . Current medications and supplements . Functional ability and status . Nutritional status . Physical  activity . Advanced directives . List of other physicians . Hospitalizations, surgeries, and ER visits in previous 12 months . Vitals . Screenings to include cognitive, depression, and falls . Referrals and appointments  In addition, I have reviewed and discussed with patient certain preventive protocols, quality metrics, and best practice recommendations. A written personalized care plan for preventive services as well as general preventive health recommendations were provided to patient.     Sheral Flow, LPN   07/15/8248   Nurse Notes:  Medications reviewed with patient; no opioid use noted. Referrals placed for: Audiology, due to decreased hearing; Colonoscopy, due to black, tarry stools, constipation and hemorrhoids; Eye Exam, due to diabetes and blurred vision.

## 2020-09-13 ENCOUNTER — Ambulatory Visit: Payer: Medicare HMO | Admitting: Audiologist

## 2020-09-13 NOTE — Progress Notes (Deleted)
Cardiology Office Note:    Date:  09/13/2020   ID:  [name] [name], DOB Jul 06, 1955, MRN [id number]  PCP:  Biagio Borg, MD   [place]  Cardiologist:  Sherren Mocha, MD *** Advanced Practice Provider:  Liliane Shi, PA-C Electrophysiologist:  None  {Press F2 to show EP APP, CHF, sleep or structural heart MD               :[id number]}  { Click here to update then REFRESH NOTE - MD (PCP) or APP (Team Member)  Change PCP Type for MD, Specialty for APP is either Cardiology or Clinical Cardiac Electrophysiology  :[id number]}   Referring MD: Biagio Borg, MD   Chief Complaint:  No chief complaint on file.    Patient Profile:   {Link to SnapShot    :1}  [name] [name] is a 65 y.o. male with:   Coronary artery disease  ? S/p PCI to RCA and PL1 ? S/p cutting POBA to RCA 2/2 ISR in 5/08 ? S/p ant STEMI in 3/14 >> PCI: DES to LAD ? S/p NSTEMI in 6/15 >> PCI: DES to OM1 ? S/p NSTEMI 10/15 >> Med Rx ? Myoview 1/17: no ischemia ? Cath at [facility] 05/2019: RCA 100 CTO  Heart failure with preserved ejection fraction  ? Ischemic CM ? Echocardiogram 05/2019 [facility]): EF 50-55 ? Echocardiogram 8/21: EF 50, inf AK  Supraventricular tachycardia ? Monitor 9/21: Longest 17 seconds >> beta-blocker increased  PVCs (monitor 9/21: 6% burden)  Hypertension   Hyperlipidemia   Chronic kidney disease  Diabetes mellitus   OSA   GERD  Tobacco abuse   Prior CV studies: ZIO monitor 02/2020 1. The basic rhythm is normal sinus with an average HR of 59 bpm with baseline IVCD/bundle branch block 2. No atrial fibrillation or flutter 3. No high-grade heart block or pathologic pauses 4. There are frequent PVC's (6% burden) and rare, nonsustained episodes of idioventricular rhythm. There are rare ventricular runs, longest 6 beats 5. There are occasional supraventricular beats (3.7% burden) and rare supraventricular runs, longest lasting 17 seconds    Echocardiogram8/5/21 EF 50, inf AK , post and inf HK, mod LVH, normal RVSF, RVSP 25.3, mild to mod LAE, trivial MR,  Cardiac catheterization 05/14/2019 ([facility]) LM normal LAD diff irregs LCx diffuse irregs RCA mid 100 (CTO)  Echocardiogram 05/13/2019 ([facility]) Mod conc LVH, inf AK and likely post HK, EF 50-55  Echocardiogram 05/31/17 Severe conc LVH, EF 55-60, no RWMA, Gr 1 DD, PASP 47  Echo 07/07/15 EF 45-50%, inferior HK, mild BAE, PASP 33 mmHg  Myoview 06/16/15 Intermediate risk stress nuclear study with a large, severe, predominantly fixed inferior lateral defect consistent with prior infarct; no significant ischemia; EF 35 but visually appears better; akinesis of the basal and mid inferior lateral wall; mild LVE; suggest echo to better assess LV function. Study intermediate risk due to reduced LV function.  Echo 9/21/6 Inf HK, mild LVH, EF 55%, mild LAE, normal RVF, mild RAE, PASP 35 mmHg  LHC (10/15):  Proximal LAD ectatic, mid LAD stent patent, distal LAD 70% at the apex, proximal and mid circumflex diffuse ectasia, small OM1 with CTO, OM2 stent patent, distal OM2 90%, OM3 30-40%, very distal circumflex 90%, proximal to mid RCA stent patent, mid RCA stent 60-70% ISR, EF 50%, inferobasal akinesis  Echo (6/15):  Mild LVH. EF 50% to 55%. Wall motion was normal. Grade 1 diastolic dysfunction. Left atrium: The atrium was mildly dilated. Right ventricle:  The cavity size was moderately dilated. Right atrium: The atrium was mildly to moderately dilated.  Myoview 9/12:  mod scar in base/mid inf and IL segments, slight peri-infarct ischemia - low risk (med Rx continued).  Carotid US (4/13): Bilateral: no ICA stenosis No results found for this or any previous visit from the past 3650 days.       History of Present Illness:    [name] [name] was last seen in 05/2020.  He returns for f/u.  ***         Past Medical History:  Diagnosis Date  . CAD (coronary artery disease)     a. s/p BMS pRCA 2002; b. DES to pRCA & DES p/m RCA 2006; c. PCI/DES OM4 2007; d. PCI/CBA to RCA for ISR 10/2006; e.08/2012 STEMI/Cath/PCI:  LAD 95% >> PCI: Promus Prem DES  //  f. NSTEMI 10/15 >> LHC: mLAD stent ok, dLAD 70, OM1 CTO, OM2 stent ok, dOM2 90, OM3 30-40, dLCx 90, p-mRCA stent ok, mRCA stent ok w/ 60-70 ISR, EF 50% >> med Rx // MV 1/17: no ischemia // Cath ([facility]) 05/2019: RCA 100 (CTO)   . Combined systolic and diastolic CHF    a. Myoview 1/17: EF 35%  //  b. Echo 1/17 EF 45-50% // Echo 05/2019: EF 50-55 // Echocardiogram 8/21: EF 50, inf AK, post and mid inf HK, mod LVH, RVSP 25.3, mild to mod LAE, trivial MR   . Depression   . Diabetes mellitus ([place])    a. A1c 8.8 08/2012->Metformin initiated. => b. A1c (9/14): 6.6  . Gastroesophageal reflux disease   . History of nuclear stress test    a. Myoview 1/17: EF 35%, fixed inferior lateral defect consistent with infarct, no ischemia, intermediate risk  . History of pneumonia   . HTN (hypertension)   . Hx of NSTEMI   . Hyperlipidemia   . Ischemic cardiomyopathy    a. EF 40%; improved to normal;  b. 08/2012 Echo: EF 50-55%, mod LVH.//  c. Echo 9/16: Inf HK, mild LVH, EF 55%, mild LAE, normal RVF, mild RAE, PASP 35 mmHg  //  d. Echo 1/17: EF 45-50%, inferior HK, mild BAE, PASP 33 mmHg  . Obesity   . OSA (obstructive sleep apnea)    Does not use CPAP as of 05/2011  . Tobacco abuse     Current Medications: No outpatient medications have been marked as taking for the 09/14/20 encounter (Appointment) with [name] T, PA-C.     Allergies:   Penicillin g and Penicillins   Social History   Tobacco Use  . Smoking status: Former Smoker    Packs/day: 0.50    Years: 30.00    Pack years: 15.00    Types: Cigarettes  . Smokeless tobacco: Never Used  . Tobacco comment: trying to cut down  Vaping Use  . Vaping Use: Never used  Substance Use Topics  . Alcohol use: No  . Drug use: No    Comment: remote marijuana use     Family  Hx: The patient's family history includes Cancer in his mother; Coronary artery disease in an other family member; Depression in his mother; Hypertension in his mother; Other in his father. There is no history of Heart attack or Stroke.  ROS   EKGs/Labs/Other Test Reviewed:    EKG:  EKG is *** ordered today.  The ekg ordered today demonstrates ***  Recent Labs: 11/20/2019: Pro B Natriuretic peptide (BNP) 334.0 12/29/2019: Magnesium 2.0 06/23/2020: ALT 12; BUN  20; Creatinine, Ser 1.76; Hemoglobin 15.1; Platelets 196.0; Potassium 5.0; Sodium 143; TSH 0.86   Recent Lipid Panel Lab Results  Component Value Date/Time   CHOL 138 06/23/2020 08:49 AM   CHOL 105 05/27/2020 08:57 AM   TRIG 71.0 06/23/2020 08:49 AM   HDL 44.70 06/23/2020 08:49 AM   HDL 48 05/27/2020 08:57 AM   CHOLHDL 3 06/23/2020 08:49 AM   LDLCALC 80 06/23/2020 08:49 AM   LDLCALC 45 05/27/2020 08:57 AM      Risk Assessment/Calculations:   {Does this patient have ATRIAL FIBRILLATION?:[phone]}  Physical Exam:    VS:  There were no vitals taken for this visit.    Wt Readings from Last 3 Encounters:  09/09/20 (!) 315 lb 12.8 oz (143.2 kg)  08/29/20 (!) 320 lb (145.2 kg)  06/23/20 (!) 321 lb (145.6 kg)     Physical Exam ***     ASSESSMENT & PLAN:    *** 1. SVT (supraventricular tachycardia) ([place]) 2. PVC's (premature ventricular contractions) His palpitations have resolved.  At last visit, he was on carvedilol 18.75 mg twice daily.  I increased this to 25 mg twice daily.  Unfortunately, he is back to taking 12.5 mg twice daily.  His blood pressure is uncontrolled.  Increase carvedilol back to 25 mg twice daily.  3. Coronary artery disease involving native coronary artery of native heart without angina pectoris Status post multiple PCI procedures in the setting of ACS and ST elevation myocardial infarctions.   Cardiac catheterization December 2020 with chronically occluded RCA and no significant obstructive  disease in the LCx or LAD.  He is doing well without anginal symptoms.  Continue aspirin, clopidogrel, carvedilol, isosorbide, rosuvastatin.  4. Chronic heart failure with preserved ejection fraction (HCC) EF 50% by echocardiogram August 2021.  Overall, volume status is stable.  Continue current dose of torsemide, spironolactone.  Obtain follow-up CMET today.  5. Essential hypertension Blood pressure above target.  Continue current dose of isosorbide, torsemide.  Increase carvedilol back to 25 mg twice daily.  Follow-up with hypertension clinic in 2 to 3 weeks to reassess blood pressure and obtain EKG after increasing beta-blocker dose.  6. Mixed hyperlipidemia Continue current dose of rosuvastatin.  Obtain fasting CMET, lipids today {Are you ordering a CV Procedure (e.g. stress test, cath, DCCV, TEE, etc)?   Press F2        :[id number]}    Dispo:  No follow-ups on file.   Medication Adjustments/Labs and Tests Ordered: Current medicines are reviewed at length with the patient today.  Concerns regarding medicines are outlined above.  Tests Ordered: No orders of the defined types were placed in this encounter.  Medication Changes: No orders of the defined types were placed in this encounter.   Signed, [name], PA-C  09/13/2020 2:23 PM    [facility] [address], [place], [place]  [address] Phone: [phone]; Fax: [phone]

## 2020-09-14 ENCOUNTER — Ambulatory Visit: Payer: Medicare HMO | Admitting: Physician Assistant

## 2020-09-14 DIAGNOSIS — N182 Chronic kidney disease, stage 2 (mild): Secondary | ICD-10-CM

## 2020-09-14 DIAGNOSIS — E782 Mixed hyperlipidemia: Secondary | ICD-10-CM

## 2020-09-14 DIAGNOSIS — I10 Essential (primary) hypertension: Secondary | ICD-10-CM

## 2020-09-14 DIAGNOSIS — I471 Supraventricular tachycardia: Secondary | ICD-10-CM

## 2020-09-14 DIAGNOSIS — I2510 Atherosclerotic heart disease of native coronary artery without angina pectoris: Secondary | ICD-10-CM

## 2020-09-14 DIAGNOSIS — I5032 Chronic diastolic (congestive) heart failure: Secondary | ICD-10-CM

## 2020-09-19 ENCOUNTER — Other Ambulatory Visit: Payer: Self-pay | Admitting: Internal Medicine

## 2020-09-30 ENCOUNTER — Telehealth: Payer: Self-pay | Admitting: Internal Medicine

## 2020-09-30 ENCOUNTER — Other Ambulatory Visit: Payer: Self-pay

## 2020-09-30 DIAGNOSIS — E1159 Type 2 diabetes mellitus with other circulatory complications: Secondary | ICD-10-CM

## 2020-09-30 MED ORDER — BLOOD GLUCOSE METER KIT: PACK | 0 refills | Status: DC

## 2020-09-30 MED ORDER — BD SWABS SINGLE USE BUTTERFLY PADS: MEDICATED_PAD | 11 refills | Status: AC

## 2020-09-30 NOTE — Telephone Encounter (Signed)
1.Medication Requested:blood glucose meter kit and supplies  Alcohol Swabs (B-D SINGLE USE SWABS BUTTERFLY) PADS     2. Pharmacy (Name, [place], [facility]): [facility] - [place], [place]  3. On Med List: yes   4. Last Visit with PCP: 06-23-20  5. Next visit date with PCP: 12-26-20   Agent: Please be advised that RX refills may take up to 3 business days. We ask that you follow-up with your pharmacy.

## 2020-10-11 ENCOUNTER — Ambulatory Visit: Payer: Medicare HMO | Admitting: Audiologist

## 2020-10-24 ENCOUNTER — Telehealth: Payer: Self-pay | Admitting: Internal Medicine

## 2020-10-24 NOTE — Telephone Encounter (Signed)
Certainly sounds like an error  I will let PCCs know

## 2020-10-24 NOTE — Telephone Encounter (Signed)
[facility] has called saying that there was supposed to be a referral put in for the patient to be seen by them, the referral has not been put in but unfortunately the patients insurance is NOT accepted by them.   [place]- [phone]

## 2020-10-25 ENCOUNTER — Other Ambulatory Visit: Payer: Self-pay | Admitting: Internal Medicine

## 2020-10-25 ENCOUNTER — Ambulatory Visit: Payer: Medicare HMO | Admitting: Audiologist

## 2020-10-25 DIAGNOSIS — I2510 Atherosclerotic heart disease of native coronary artery without angina pectoris: Secondary | ICD-10-CM

## 2020-10-25 NOTE — Telephone Encounter (Signed)
Please refill as per office routine med refill policy (all routine meds refilled for 3 mo or monthly per pt preference up to one year from last visit, then month to month grace period for 3 mo, then further med refills will have to be denied)  

## 2020-11-02 ENCOUNTER — Other Ambulatory Visit: Payer: Self-pay

## 2020-11-02 ENCOUNTER — Ambulatory Visit (INDEPENDENT_AMBULATORY_CARE_PROVIDER_SITE_OTHER): Payer: Medicare HMO | Admitting: Podiatry

## 2020-11-02 ENCOUNTER — Encounter: Payer: Self-pay | Admitting: Podiatry

## 2020-11-02 DIAGNOSIS — E1142 Type 2 diabetes mellitus with diabetic polyneuropathy: Secondary | ICD-10-CM

## 2020-11-02 DIAGNOSIS — M79675 Pain in left toe(s): Secondary | ICD-10-CM

## 2020-11-02 DIAGNOSIS — L84 Corns and callosities: Secondary | ICD-10-CM

## 2020-11-02 DIAGNOSIS — M79674 Pain in right toe(s): Secondary | ICD-10-CM | POA: Diagnosis not present

## 2020-11-02 DIAGNOSIS — B351 Tinea unguium: Secondary | ICD-10-CM

## 2020-11-02 NOTE — Progress Notes (Addendum)
This patient returns to my office for at risk foot care.  This patient requires this care by a professional since this patient will be at risk due to having pre-ulcerative calluses, CKD and diabetic neuropathy and coagulation defect.  Patient is taking plavix.  This patient is unable to cut nails himself since the patient cannot reach his nails.These nails are painful walking and wearing shoes.  This patient presents for at risk foot care today.  Patient has not been seen in over 10 months.  General Appearance  Alert, conversant and in no acute stress.  Vascular  Dorsalis pedis and posterior tibial  pulses are  Weakly  palpable  bilaterally.  Capillary return is within normal limits  bilaterally. Temperature is within normal limits  bilaterally.  Neurologic  Senn-Weinstein monofilament wire test diminished   bilaterally. Muscle power within normal limits bilaterally.  Nails Thick disfigured discolored nails with subungual debris  from hallux to fifth toes bilaterally. No evidence of bacterial infection or drainage bilaterally.  Orthopedic  No limitations of motion  feet .  No crepitus or effusions noted.  No bony pathology or digital deformities noted.  Plantar flexed second metatarsal  B/L.  Skin  normotropic skin  noted bilaterally.  No signs of infections or ulcers noted.   Pre-ulcerous callus sub 2  B/L.  Porokeratosis sub 5th  Left.  Onychomycosis  Pain in right toes  Pain in left toes  Porokeratosis/Pre-ulcerous callus  B/L.  Consent was obtained for treatment procedures.   Mechanical debridement of nails 1-5  bilaterally performed with a nail nipper.  Filed with dremel without incident. Porokeratosis debrided with # 15 blade.    Return office visit     3 months                Told patient to return for periodic foot care and evaluation due to potential at risk complications.   Gardiner Barefoot DPM

## 2020-11-15 ENCOUNTER — Ambulatory Visit: Payer: Medicare HMO | Attending: Internal Medicine | Admitting: Audiologist

## 2020-11-15 ENCOUNTER — Other Ambulatory Visit: Payer: Self-pay

## 2020-11-15 DIAGNOSIS — H903 Sensorineural hearing loss, bilateral: Secondary | ICD-10-CM | POA: Diagnosis not present

## 2020-11-15 NOTE — Procedures (Signed)
  Outpatient Audiology and [place], [place]  [address] [phone]  AUDIOLOGICAL  EVALUATION  NAME: [name] [name]     DOB:   [birth year]      MRN: [id number]                                                                                     DATE: 11/15/2020     REFERENT: Biagio Borg, MD STATUS: Outpatient DIAGNOSIS: Bilateral Sensorineural Hearing Loss   History: [name] [name] was seen for an audiological evaluation.  [name] [name] is receiving a hearing evaluation due to concerns for asking people to repeat themselves often. [name] [name] has difficulty hearing in background noise, crowds, and when people are at a distance. [name] [name] also struggles to understand children. This difficulty began gradually. No pain or pressure reported in either ear. Tinnitus present in both ears sounding like a soft buzz, this does not bother him. 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   Medical history positive for diabetes with neuropathy which is a risk factor for hearing loss. No other relevant case history reported.   Evaluation:   Otoscopy showed a clear view of the tympanic membranes, bilaterally  Tympanometry results were consistent with normal middle ear function.   Audiometric testing was completed using conventional audiometry with insert transducer. Speech Recognition Thresholds were consistent with pure tone averages. Word Recognition was excellent at an elevated level. Pure tone thresholds show normal sloping to moderately severe sensorineural hearing loss in both ears. Test results are consistent with symmetric prebyscusis.   Results:  The test results were reviewed with [name] [name], [name] [name]  was counseled on the nature and degree of his hearing loss. [name] [name] was provided with several copies of his audiogram that illustrate his degree of hearing loss in both ears. His hearing loss is in the high frequencies preventing [name] [name] from hearing high  frequency consonants such as /s/ /sh/ /f/ /t/ and /th/. These sounds help differentiate the words [name] [name]  hears. Without these sounds, speech is muffled and unclear unless someone is face to face within 5 feet without a mask. [name] [name] is an excellent hearing aid candidate. With hearing aids the clarity of speech will improve and [name] [name] will not have to work so hard to hear. [name] [name] was provided with a list of audiologists that dispense hearing aids.    Recommendations: 1. Amplification is necessary for both ears. Hearing aids can be purchased from a variety of locations. See provided list for locations in the [place] area.  a. Recommend annual hearing tests due to diabetes to monitor progression of hearing loss with [place] or the audiologist from whom [name] [name] purchases hearing aids.  2. Referral put in for a [employer] Mobile phone. They will contact [name] [name] to set up this free service for people with hearing loss.    Alfonse Alpers  Audiologist, Au.D., CCC-A 11/15/2020  10:51 AM  Cc: Biagio Borg, MD

## 2020-12-05 ENCOUNTER — Other Ambulatory Visit: Payer: Self-pay | Admitting: Internal Medicine

## 2020-12-05 NOTE — Telephone Encounter (Signed)
Please refill as per office routine med refill policy (all routine meds refilled for 3 mo or monthly per pt preference up to one year from last visit, then month to month grace period for 3 mo, then further med refills will have to be denied)  

## 2020-12-06 ENCOUNTER — Other Ambulatory Visit: Payer: Self-pay | Admitting: Internal Medicine

## 2020-12-06 NOTE — Telephone Encounter (Signed)
Please refill as per office routine med refill policy (all routine meds refilled for 3 mo or monthly per pt preference up to one year from last visit, then month to month grace period for 3 mo, then further med refills will have to be denied)  

## 2020-12-26 ENCOUNTER — Encounter: Payer: Self-pay | Admitting: Internal Medicine

## 2020-12-26 ENCOUNTER — Ambulatory Visit (INDEPENDENT_AMBULATORY_CARE_PROVIDER_SITE_OTHER): Payer: Medicare HMO | Admitting: Internal Medicine

## 2020-12-26 ENCOUNTER — Other Ambulatory Visit: Payer: Self-pay

## 2020-12-26 ENCOUNTER — Ambulatory Visit (INDEPENDENT_AMBULATORY_CARE_PROVIDER_SITE_OTHER): Payer: Medicare HMO

## 2020-12-26 VITALS — BP 122/90 | HR 73 | Temp 98.6°F | Ht 73.0 in | Wt 315.6 lb

## 2020-12-26 DIAGNOSIS — I517 Cardiomegaly: Secondary | ICD-10-CM | POA: Diagnosis not present

## 2020-12-26 DIAGNOSIS — R079 Chest pain, unspecified: Secondary | ICD-10-CM | POA: Diagnosis not present

## 2020-12-26 DIAGNOSIS — I700 Atherosclerosis of aorta: Secondary | ICD-10-CM | POA: Diagnosis not present

## 2020-12-26 DIAGNOSIS — R059 Cough, unspecified: Secondary | ICD-10-CM

## 2020-12-26 DIAGNOSIS — E782 Mixed hyperlipidemia: Secondary | ICD-10-CM | POA: Diagnosis not present

## 2020-12-26 DIAGNOSIS — N1831 Chronic kidney disease, stage 3a: Secondary | ICD-10-CM | POA: Diagnosis not present

## 2020-12-26 DIAGNOSIS — E1159 Type 2 diabetes mellitus with other circulatory complications: Secondary | ICD-10-CM

## 2020-12-26 LAB — BASIC METABOLIC PANEL
BUN: 16 mg/dL (ref 6–23)
CO2: 31 mEq/L (ref 19–32)
Calcium: 9.8 mg/dL (ref 8.4–10.5)
Chloride: 102 mEq/L (ref 96–112)
Creatinine, Ser: 1.56 mg/dL — ABNORMAL HIGH (ref 0.40–1.50)
GFR: 46.86 mL/min — ABNORMAL LOW (ref >60.00)
Glucose, Bld: 317 mg/dL — ABNORMAL HIGH (ref 70–99)
Potassium: 4 mEq/L (ref 3.5–5.1)
Sodium: 141 mEq/L (ref 135–145)

## 2020-12-26 LAB — LIPID PANEL
Cholesterol: 230 mg/dL — ABNORMAL HIGH (ref 0–200)
HDL: 40.9 mg/dL (ref >39.00)
LDL Cholesterol: 172 mg/dL — ABNORMAL HIGH (ref 0–99)
NonHDL: 189.28
Total CHOL/HDL Ratio: 6
Triglycerides: 86 mg/dL (ref 0.0–149.0)
VLDL: 17.2 mg/dL (ref 0.0–40.0)

## 2020-12-26 LAB — HEPATIC FUNCTION PANEL
ALT: 13 U/L (ref 0–53)
AST: 12 U/L (ref 0–37)
Albumin: 4 g/dL (ref 3.5–5.2)
Alkaline Phosphatase: 88 U/L (ref 39–117)
Bilirubin, Direct: 0.1 mg/dL (ref 0.0–0.3)
Total Bilirubin: 0.6 mg/dL (ref 0.2–1.2)
Total Protein: 7.4 g/dL (ref 6.0–8.3)

## 2020-12-26 LAB — HEMOGLOBIN A1C: Hgb A1c MFr Bld: 12.1 % — ABNORMAL HIGH (ref 4.6–6.5)

## 2020-12-26 MED ORDER — TRUE METRIX LEVEL 1 LOW VI SOLN: 0 refills | Status: DC

## 2020-12-26 MED ORDER — TRUE METRIX BLOOD GLUCOSE TEST VI STRP
ORAL_STRIP | 12 refills | Status: DC
Start: 2020-12-26 — End: 2021-02-17

## 2020-12-26 NOTE — Assessment & Plan Note (Signed)
Etiology unclear, for cxr,  to f/u any worsening symptoms or concerns 

## 2020-12-26 NOTE — Assessment & Plan Note (Signed)
Lab Results  Component Value Date   CREATININE 1.56 (H) 12/26/2020   Stable overall, cont to avoid nephrotoxins

## 2020-12-26 NOTE — Assessment & Plan Note (Signed)
Pt to continue low chol diet, exercise, and crestor

## 2020-12-26 NOTE — Assessment & Plan Note (Signed)
Lab Results  Component Value Date   [place] 80 06/23/2020   Uncontrolled, goal ldl < 70, pt to continue current statin crestor 40 for now as declines add or change, will work on diet

## 2020-12-26 NOTE — Progress Notes (Signed)
Patient ID: [name] [name], male   DOB: 01-Nov-1955, 65 y.o.   MRN: [id number]        Chief Complaint: follow up HTN, HLD and DM       HPI:  [name] is a 65 y.o. male here with above, incidentally had covid URI symptoms last wk, did not test, now seems to be over it after a lady friend of his was sick as well.  Not checking sugars bc needs new glucometer.  Admits to less good diet in the past few months but plans to do better.  Has ongoing mild depressive symptoms, but no suicidal ideation, or panic;  does not need further med or counseling for depression per pt.   Pt denies polydipsia, polyuria, or new focal neuro s/s.  Pt denies chest pain, increased sob or doe, wheezing, orthopnea, PND, increased LE swelling, palpitations, dizziness or syncope.     Wt Readings from Last 3 Encounters:  12/26/20 (!) 315 lb 9.6 oz (143.2 kg)  09/09/20 (!) 315 lb 12.8 oz (143.2 kg)  08/29/20 (!) 320 lb (145.2 kg)   BP Readings from Last 3 Encounters:  12/26/20 122/90  09/09/20 (!) 150/80  08/29/20 (!) 200/100         Past Medical History:  Diagnosis Date   CAD (coronary artery disease)    a. s/p BMS pRCA 2002; b. DES to pRCA & DES p/m RCA 2006; c. PCI/DES OM4 2007; d. PCI/CBA to RCA for ISR 10/2006; e.08/2012 STEMI/Cath/PCI:  LAD 95% >> PCI: Promus Prem DES  //  f. NSTEMI 10/15 >> LHC: mLAD stent ok, dLAD 70, OM1 CTO, OM2 stent ok, dOM2 90, OM3 30-40, dLCx 90, p-mRCA stent ok, mRCA stent ok w/ 60-70 ISR, EF 50% >> med Rx // MV 1/17: no ischemia // Cath ([facility]) 05/2019: RCA 100 (CTO)    Combined systolic and diastolic CHF    a. Myoview 1/17: EF 35%  //  b. Echo 1/17 EF 45-50% // Echo 05/2019: EF 50-55 // Echocardiogram 8/21: EF 50, inf AK, post and mid inf HK, mod LVH, RVSP 25.3, mild to mod LAE, trivial MR    Depression    Diabetes mellitus ([place])    a. A1c 8.8 08/2012->Metformin initiated. => b. A1c (9/14): 6.6   Gastroesophageal reflux disease    History of nuclear stress test    a. Myoview 1/17: EF 35%,  fixed inferior lateral defect consistent with infarct, no ischemia, intermediate risk   History of pneumonia    HTN (hypertension)    Hx of NSTEMI    Hyperlipidemia    Ischemic cardiomyopathy    a. EF 40%; improved to normal;  b. 08/2012 Echo: EF 50-55%, mod LVH.//  c. Echo 9/16: Inf HK, mild LVH, EF 55%, mild LAE, normal RVF, mild RAE, PASP 35 mmHg  //  d. Echo 1/17: EF 45-50%, inferior HK, mild BAE, PASP 33 mmHg   Obesity    OSA (obstructive sleep apnea)    Does not use CPAP as of 05/2011   Tobacco abuse    Past Surgical History:  Procedure Laterality Date   CORONARY ANGIOPLASTY WITH STENT PLACEMENT     CORONARY STENT PLACEMENT  2009   LEFT HEART CATHETERIZATION WITH CORONARY ANGIOGRAM N/A 08/31/2012   Procedure: LEFT HEART CATHETERIZATION WITH CORONARY ANGIOGRAM;  Surgeon: Burnell Blanks, MD;  Location: [facility] CATH LAB;  Service: Cardiovascular;  Laterality: N/A;   LEFT HEART CATHETERIZATION WITH CORONARY ANGIOGRAM N/A 11/16/2013   Procedure: LEFT HEART CATHETERIZATION WITH  CORONARY ANGIOGRAM;  Surgeon: Micheline Chapman, MD;  Location: [facility] CATH LAB;  Service: Cardiovascular;  Laterality: N/A;   LEFT HEART CATHETERIZATION WITH CORONARY ANGIOGRAM N/A 03/15/2014   Procedure: LEFT HEART CATHETERIZATION WITH CORONARY ANGIOGRAM;  Surgeon: Peter M Swaziland, MD;  Location: [facility] CATH LAB;  Service: Cardiovascular;  Laterality: N/A;   PERCUTANEOUS CORONARY STENT INTERVENTION (PCI-S) Right 08/31/2012   Procedure: PERCUTANEOUS CORONARY STENT INTERVENTION (PCI-S);  Surgeon: Kathleene Hazel, MD;  Location: [facility] CATH LAB;  Service: Cardiovascular;  Laterality: Right;   PERCUTANEOUS CORONARY STENT INTERVENTION (PCI-S)  11/16/2013   Procedure: PERCUTANEOUS CORONARY STENT INTERVENTION (PCI-S);  Surgeon: Micheline Chapman, MD;  Location: [facility] CATH LAB;  Service: Cardiovascular;;    reports that he has quit smoking. His smoking use included cigarettes. He has a 15.00 pack-year smoking history. He has never used  smokeless tobacco. He reports that he does not drink alcohol and does not use drugs. family history includes Cancer in his mother; Coronary artery disease in an other family member; Depression in his mother; Hypertension in his mother; Other in his father. Allergies  Allergen Reactions   Penicillin G Other (See Comments)    Did it involve swelling of the face/tongue/throat, SOB, or low BP?  N/A Did it involve sudden or severe rash/hives, skin peeling, or any reaction on the inside of your mouth or nose? N/A Did you need to seek medical attention at a hospital or doctor's office? N/A When did it last happen? Child If all above answers are "NO", may proceed with cephalosporin use.   Penicillins Other (See Comments)    Did it involve swelling of the face/tongue/throat, SOB, or low BP? N/A Did it involve sudden or severe rash/hives, skin peeling, or any reaction on the inside of your mouth or nose?N/A Did you need to seek medical attention at a hospital or doctor's office? N/A When did it last happen? Child      If all above answers are "NO", may proceed with cephalosporin use.   Current Outpatient Medications on File Prior to Visit  Medication Sig Dispense Refill   Alcohol Swabs (B-D SINGLE USE SWABS BUTTERFLY) PADS Use up to 4 times a day to test blood sugars. Dx: E10.9, E11.9 100 each 11   aspirin 81 MG tablet Take 81 mg by mouth daily.     blood glucose meter kit and supplies Dispense based on patient and insurance preference. Use up to four times daily as directed. (FOR ICD-10 E10.9, E11.9). 1 each 0   carvedilol (COREG) 25 MG tablet Take 1 tablet (25 mg total) by mouth 2 (two) times daily with a meal. 180 tablet 3   clopidogrel (PLAVIX) 75 MG tablet Take 1 tablet (75 mg total) by mouth daily. 90 tablet 3   gabapentin (NEURONTIN) 300 MG capsule Take 1 capsule (300 mg total) by mouth 3 (three) times daily. 270 capsule 1   isosorbide mononitrate (IMDUR) 30 MG 24 hr tablet TAKE 3 TABLETS(90  MG) BY MOUTH DAILY 270 tablet 1   Lancets 33G MISC Use to test blood sugars up to 4 times a day. DX: E10.9, E11.9 100 each 6   Multiple Vitamins-Minerals (VITAMIN D3 COMPLETE PO) Take 1 tablet by mouth daily.     nitroGLYCERIN (NITROSTAT) 0.4 MG SL tablet PLACE 1 TABLET UNDER THE TONGUE EVERY 5 MINUTES AS NEEDED FOR CHEST PAIN 25 tablet 5   pantoprazole (PROTONIX) 40 MG tablet Take 1 tablet (40 mg total) by mouth daily. 90 tablet 3  PARoxetine (PAXIL) 20 MG tablet Take 1 tablet (20 mg total) by mouth daily. 90 tablet 3   potassium chloride (KLOR-CON) 10 MEQ tablet Take 2 tablets by mouth in the morning and 1 tablet by mouth in the evening 270 tablet 3   predniSONE (DELTASONE) 20 MG tablet Take 1 tablet (20 mg total) by mouth daily with breakfast. 5 tablet 0   rosuvastatin (CRESTOR) 40 MG tablet Take 1 tablet (40 mg total) by mouth daily. 90 tablet 1   spironolactone (ALDACTONE) 25 MG tablet Take 0.5 tablets (12.5 mg total) by mouth daily. 45 tablet 3   torsemide (DEMADEX) 20 MG tablet TAKE 1 TABLET(20 MG) BY MOUTH TWICE DAILY 180 tablet 1   traMADol (ULTRAM) 50 MG tablet TAKE 1 TABLET(50 MG) BY MOUTH EVERY 6 HOURS AS NEEDED 120 tablet 0   vitamin B-12 (CYANOCOBALAMIN) 1000 MCG tablet Take 1 tablet (1,000 mcg total) by mouth daily. 90 tablet 3   No current facility-administered medications on file prior to visit.        ROS:  All others reviewed and negative.  Objective        PE:  BP 122/90 (BP Location: Left Arm, Patient Position: Sitting, Cuff Size: Large)   Pulse 73   Temp 98.6 F (37 C) (Oral)   Ht $R'6\' 1"'QF$  (1.854 m)   Wt (!) 315 lb 9.6 oz (143.2 kg)   SpO2 98%   BMI 41.64 kg/m                 Constitutional: Pt appears in NAD               HENT: Head: NCAT.                Right Ear: External ear normal.                 Left Ear: External ear normal.                Eyes: . Pupils are equal, round, and reactive to light. Conjunctivae and EOM are normal               Nose: without  d/c or deformity               Neck: Neck supple. Gross normal ROM               Cardiovascular: Normal rate and regular rhythm.                 Pulmonary/Chest: Effort normal and breath sounds without rales or wheezing.                Abd:  Soft, NT, ND, + BS, no organomegaly               Neurological: Pt is alert. At baseline orientation, motor grossly intact               Skin: Skin is warm. No rashes, no other new lesions, LE edema - none               Psychiatric: Pt behavior is normal without agitation   Micro: none  Cardiac tracings I have personally interpreted today:  none  Pertinent Radiological findings (summarize): none   Lab Results  Component Value Date   WBC 5.6 06/23/2020   HGB 15.1 06/23/2020   HCT 46.7 06/23/2020   PLT 196.0 06/23/2020   GLUCOSE 317 (H) 12/26/2020   CHOL 230 (H) 12/26/2020  TRIG 86.0 12/26/2020   HDL 40.90 12/26/2020   LDLCALC 172 (H) 12/26/2020   ALT 13 12/26/2020   AST 12 12/26/2020   NA 141 12/26/2020   K 4.0 12/26/2020   CL 102 12/26/2020   CREATININE 1.56 (H) 12/26/2020   BUN 16 12/26/2020   CO2 31 12/26/2020   TSH 0.86 06/23/2020   PSA 1.07 06/23/2020   INR 1.00 05/29/2017   HGBA1C 12.1 Repeated and verified X2. (H) 12/26/2020   MICROALBUR 64.1 (H) 06/23/2020   Assessment/Plan:  Tully Mcinturff is a 65 y.o. Black or African American [2] male with  has a past medical history of CAD (coronary artery disease), Combined systolic and diastolic CHF, Depression, Diabetes mellitus ([place]), Gastroesophageal reflux disease, History of nuclear stress test, History of pneumonia, HTN (hypertension), Hx of NSTEMI, Hyperlipidemia, Ischemic cardiomyopathy, Obesity, OSA (obstructive sleep apnea), and Tobacco abuse.  Hyperlipidemia Lab Results  Component Value Date   [place] 80 06/23/2020   Uncontrolled, goal ldl < 70, pt to continue current statin crestor 55 for now as declines add or change, will work on diet   DM2 (diabetes mellitus, type 2)  ([place])  Stable by hx,, pt to continue current medical treatment  - diet   CKD (chronic kidney disease), stage III ([place]) Lab Results  Component Value Date   CREATININE 1.56 (H) 12/26/2020   Stable overall, cont to avoid nephrotoxins   Aortic atherosclerosis ([facility]) Pt to continue low chol diet, exercise, and crestor  Cough Etiology unclear, for cxr,  to f/u any worsening symptoms or concerns  Followup: Return in about 6 months (around 06/28/2021).  Cathlean Cower, MD 12/26/2020 10:52 PM [facility]

## 2020-12-26 NOTE — Assessment & Plan Note (Signed)
  Stable by hx,, pt to continue current medical treatment  - diet

## 2020-12-26 NOTE — Patient Instructions (Signed)
Your glucometer machine prescription was sent to the pharmacy  Please continue all other medications as before, and refills have been done if requested.  Please have the pharmacy call with any other refills you may need.  Please continue your efforts at being more active, low cholesterol diet, and weight control.  You are otherwise up to date with prevention measures today.  Please keep your appointments with your specialists as you may have planned  Please go to the XRAY Department in the first floor for the x-ray testing  Please go to the LAB at the blood drawing area for the tests to be done  You will be contacted by phone if any changes need to be made immediately.  Otherwise, you will receive a letter about your results with an explanation, but please check with MyChart first.  Please remember to sign up for MyChart if you have not done so, as this will be important to you in the future with finding out test results, communicating by private email, and scheduling acute appointments online when needed.  Please make an Appointment to return in 6 months, or sooner if needed

## 2021-01-09 DIAGNOSIS — Z8673 Personal history of transient ischemic attack (TIA), and cerebral infarction without residual deficits: Secondary | ICD-10-CM

## 2021-01-09 HISTORY — DX: Personal history of transient ischemic attack (TIA), and cerebral infarction without residual deficits: Z86.73

## 2021-01-12 ENCOUNTER — Other Ambulatory Visit: Payer: Self-pay

## 2021-01-12 MED ORDER — TRUE METRIX LEVEL 1 LOW VI SOLN: 0 refills | Status: DC

## 2021-01-13 ENCOUNTER — Other Ambulatory Visit: Payer: Self-pay

## 2021-01-13 DIAGNOSIS — E1059 Type 1 diabetes mellitus with other circulatory complications: Secondary | ICD-10-CM

## 2021-01-13 NOTE — Patient Outreach (Signed)
[facility]) Care Management  01/13/2021  Laney Tufo 15-May-1956 [id number]  Incoming call regarding EMMI Join unsuccessful letter. CMA verified patients information per HIPAA compliance. Patient unsure exactly what needs he has at the time, but feels it would be a good start to have CM follow him until he can keep up with his medications and appointments.  CMA will send referral to [facility] embedded team member for follow up. Patient informed to allow 7-10 days for outreach.  Ina Homes [facility] Management Assistant [phone]

## 2021-01-16 ENCOUNTER — Telehealth: Payer: Self-pay | Admitting: *Deleted

## 2021-01-16 NOTE — Chronic Care Management (AMB) (Signed)
  Chronic Care Management   Note  01/16/2021 Name: [name] [name] MRN: [id number] DOB: [birth year]  [name] [name] is a 65 y.o. year old male who is a primary care patient of Biagio Borg, MD. I reached out to [name] by phone today in response to a referral sent by [name] [name] PCP Biagio Borg, MD     [name] [name] was given information about Chronic Care Management services today including:  CCM service includes personalized support from designated clinical staff supervised by his physician, including individualized plan of care and coordination with other care providers 24/7 contact phone numbers for assistance for urgent and routine care needs. Service will only be billed when office clinical staff spend 20 minutes or more in a month to coordinate care. Only one practitioner may furnish and bill the service in a calendar month. The patient may stop CCM services at any time (effective at the end of the month) by phone call to the office staff. The patient will be responsible for cost sharing (co-pay) of up to 20% of the service fee (after annual deductible is met).  Patient agreed to services and verbal consent obtained.   Follow up plan: Telephone appointment with care management team member scheduled for: 01/25/2021  Julian Hy, [facility]  Direct Dial: [phone]

## 2021-01-17 NOTE — Progress Notes (Signed)
Cardiology Office Note:    Date:  01/18/2021   ID:  [name] [name], DOB [birth year], MRN [id number]  PCP:  [name] Borg, MD   [name] [name] Providers Cardiologist:  [name] Mocha, MD Cardiology APP:  [name] [name]      Referring MD: [name] Borg, MD   Chief Complaint:  Follow-up (CAD, CHF)    Patient Profile:    [name] [name] is a 65 y.o. male with:  Coronary artery disease S/p PCI to RCA and PL1 S/p cutting POBA to RCA 2/2 ISR in 5/08 S/p ant STEMI in 3/14 >> PCI: DES to LAD S/p NSTEMI in 6/15 >> PCI: DES to [place] S/p NSTEMI 10/15 >> Med Rx Myoview 1/17: no ischemia Cath at [facility] 05/2019: RCA 100 CTO Heart failure with preserved ejection fraction  Ischemic CM Echocardiogram 05/2019 ([facility]): EF 50-55 Echocardiogram 8/21: EF 50, inf AK Supraventricular tachycardia Monitor 9/21: Longest 17 seconds beta-blocker increased  PVCs (monitor 9/21: 6% burden) beta-blocker Rx  Hypertension Hyperlipidemia Chronic kidney disease Diabetes mellitus OSA GERD Tobacco abuse Aortic atherosclerosis      Prior CV studies: ZIO monitor 02/2020 The basic rhythm is normal sinus with an average HR of 59 bpm with baseline IVCD/bundle branch block No atrial fibrillation or flutter No high-grade heart block or pathologic pauses There are frequent PVC's (6% burden) and rare, nonsustained episodes of idioventricular rhythm. There are rare ventricular runs, longest 6 beats There are occasional supraventricular beats (3.7% burden) and rare supraventricular runs, longest lasting 17 seconds   Echocardiogram 01/14/20 EF 50, inf AK , post and inf HK, mod LVH, normal RVSF, RVSP 25.3, mild to mod LAE, trivial MR,    Cardiac catheterization 05/14/2019 ([facility]) LM normal LAD diff irregs LCx diffuse irregs RCA mid 100 (CTO)   Echocardiogram 05/13/2019 ([facility]) Mod conc LVH, inf AK and likely post HK, EF 50-55   Myoview 06/16/15 Intermediate risk stress nuclear study with a large, severe,  predominantly fixed inferior lateral defect consistent with prior infarct; no significant ischemia; EF 35 but visually appears better; akinesis of the basal and mid inferior lateral wall; mild LVE; suggest echo to better assess LV function. Study intermediate risk due to reduced LV function.    Carotid US (4/13):  Bilateral: no ICA stenosis    History of Present Illness: [name] [name] was last seen in 12/21.  He returns for f/u.  Of note, he did see Dr. Jenny [name] recently with a cough.  A CXR showed some vascular congestion.  He is here alone.  He notes that he had a diet rich in salt last night.  Today he feels more short of breath with exertion which is unusual for him.  He also feels "heavy".  He denies chest pain.  He denies any chest discomfort reminiscent of his previous angina.  He has not had syncope.  He does admit to two-pillow orthopnea.  He has not had significant leg edema.  He does admit to missing medications at times.  Sometimes he remembers late in the day and takes it at that time.        Past Medical History:  Diagnosis Date   CAD (coronary artery disease)    a. s/p BMS pRCA 2002; b. DES to pRCA & DES p/m RCA 2006; c. PCI/DES OM4 2007; d. PCI/CBA to RCA for ISR 10/2006; e.08/2012 STEMI/Cath/PCI:  LAD 95% >> PCI: Promus Prem DES  //  f. NSTEMI 10/15 >> LHC: mLAD stent ok, dLAD 70, OM1 CTO, OM2 stent  ok, dOM2 90, OM3 30-40, dLCx 90, p-mRCA stent ok, mRCA stent ok w/ 60-70 ISR, EF 50% >> med Rx // MV 1/17: no ischemia // Cath ([facility]) 05/2019: RCA 100 (CTO)    Combined systolic and diastolic CHF    a. Myoview 1/17: EF 35%  //  b. Echo 1/17 EF 45-50% // Echo 05/2019: EF 50-55 // Echocardiogram 8/21: EF 50, inf AK, post and mid inf HK, mod LVH, RVSP 25.3, mild to mod LAE, trivial MR    Depression    Diabetes mellitus ([place])    a. A1c 8.8 08/2012->Metformin initiated. => b. A1c (9/14): 6.6   Gastroesophageal reflux disease    History of nuclear stress test    a. Myoview 1/17: EF 35%, fixed inferior  lateral defect consistent with infarct, no ischemia, intermediate risk   History of pneumonia    HTN (hypertension)    Hx of NSTEMI    Hyperlipidemia    Ischemic cardiomyopathy    a. EF 40%; improved to normal;  b. 08/2012 Echo: EF 50-55%, mod LVH.//  c. Echo 9/16: Inf HK, mild LVH, EF 55%, mild LAE, normal RVF, mild RAE, PASP 35 mmHg  //  d. Echo 1/17: EF 45-50%, inferior HK, mild BAE, PASP 33 mmHg   Obesity    OSA (obstructive sleep apnea)    Does not use CPAP as of 05/2011   Tobacco abuse     Current Medications: Current Meds  Medication Sig   Alcohol Swabs (B-D SINGLE USE SWABS BUTTERFLY) PADS Use up to 4 times a day to test blood sugars. Dx: E10.9, E11.9   aspirin 81 MG tablet Take 81 mg by mouth daily.   Blood Glucose Calibration (TRUE METRIX LEVEL 1) Low SOLN Use as needed. Dx: E10.9, E11.9   blood glucose meter kit and supplies Dispense based on patient and insurance preference. Use up to four times daily as directed. (FOR ICD-10 E10.9, E11.9).   carvedilol (COREG) 25 MG tablet Take 1 tablet (25 mg total) by mouth 2 (two) times daily with a meal.   clopidogrel (PLAVIX) 75 MG tablet Take 1 tablet (75 mg total) by mouth daily.   empagliflozin (JARDIANCE) 10 MG TABS tablet Take 1 tablet (10 mg total) by mouth daily before breakfast.   gabapentin (NEURONTIN) 300 MG capsule Take 300 mg by mouth 2 (two) times daily.   glucose blood (TRUE METRIX BLOOD GLUCOSE TEST) test strip Use to test blood sugars up to 4 times a day. DX: E10.9, E.11.9   isosorbide mononitrate (IMDUR) 30 MG 24 hr tablet TAKE 3 TABLETS(90 MG) BY MOUTH DAILY   Lancets 33G MISC Use to test blood sugars up to 4 times a day. DX: E10.9, E11.9   Multiple Vitamins-Minerals (VITAMIN D3 COMPLETE PO) Take 1 tablet by mouth daily.   nitroGLYCERIN (NITROSTAT) 0.4 MG SL tablet PLACE 1 TABLET UNDER THE TONGUE EVERY 5 MINUTES AS NEEDED FOR CHEST PAIN   pantoprazole (PROTONIX) 40 MG tablet Take 1 tablet (40 mg total) by mouth daily.    PARoxetine (PAXIL) 20 MG tablet Take 1 tablet (20 mg total) by mouth daily.   potassium chloride (KLOR-CON) 10 MEQ tablet Take 2 tablets by mouth in the morning and 1 tablet by mouth in the evening   rosuvastatin (CRESTOR) 40 MG tablet Take 1 tablet (40 mg total) by mouth daily.   spironolactone (ALDACTONE) 25 MG tablet Take 0.5 tablets (12.5 mg total) by mouth daily.   torsemide (DEMADEX) 20 MG tablet Take 1.5 tablets (  30 mg total) by mouth 2 (two) times daily.   traMADol (ULTRAM) 50 MG tablet TAKE 1 TABLET(50 MG) BY MOUTH EVERY 6 HOURS AS NEEDED   vitamin B-12 (CYANOCOBALAMIN) 1000 MCG tablet Take 1 tablet (1,000 mcg total) by mouth daily.   [DISCONTINUED] torsemide (DEMADEX) 20 MG tablet TAKE 1 TABLET(20 MG) BY MOUTH TWICE DAILY     Allergies:   Penicillin g and Penicillins   Social History   Tobacco Use   Smoking status: Former    Packs/day: 0.50    Years: 30.00    Pack years: 15.00    Types: Cigarettes   Smokeless tobacco: Never   Tobacco comments:    trying to cut down  Vaping Use   Vaping Use: Never used  Substance Use Topics   Alcohol use: No   Drug use: No    Comment: remote marijuana use     Family Hx: The patient's family history includes Cancer in his mother; Coronary artery disease in an other family member; Depression in his mother; Hypertension in his mother; Other in his father. There is no history of Heart attack or Stroke.  Review of Systems  Respiratory:  Negative for wheezing.   Gastrointestinal:  Negative for hematochezia and melena.  Genitourinary:  Negative for hematuria.    EKGs/Labs/Other Test Reviewed:    EKG:  EKG is   ordered today.  The ekg ordered today demonstrates NSR, HR 62, left axis deviation, right bundle branch block, nonspecific ST-T wave changes, QTC 497, similar to old EKGs.  Recent Labs: 06/23/2020: Hemoglobin 15.1; Platelets 196.0; TSH 0.86 12/26/2020: ALT 13; BUN 16; Creatinine, Ser 1.56; Potassium 4.0; Sodium 141   Recent  Lipid Panel Lab Results  Component Value Date/Time   CHOL 230 (H) 12/26/2020 08:55 AM   CHOL 105 05/27/2020 08:57 AM   TRIG 86.0 12/26/2020 08:55 AM   HDL 40.90 12/26/2020 08:55 AM   HDL 48 05/27/2020 08:57 AM   LDLCALC 172 (H) 12/26/2020 08:55 AM   LDLCALC 45 05/27/2020 08:57 AM      Risk Assessment/Calculations:      Physical Exam:    VS:  BP 102/80   Pulse 64   Ht _0  (1.854 m)   Wt (!) 318 lb 12.8 oz (144.6 kg)   SpO2 96%   BMI 42.06 kg/m     Wt Readings from Last 3 Encounters:  01/18/21 (!) 318 lb 12.8 oz (144.6 kg)  12/26/20 (!) 315 lb 9.6 oz (143.2 kg)  09/09/20 (!) 315 lb 12.8 oz (143.2 kg)     Constitutional:      Appearance: Healthy appearance. Not in distress.  Neck:     Vascular: JVD normal.  Pulmonary:     Effort: Pulmonary effort is normal.     Breath sounds: No wheezing. Rales (Coarse BS vs Rales at the bases) present.  Cardiovascular:     Normal rate. Regular rhythm. Normal S1. Normal S2.      Murmurs: There is no murmur.     S3 Gallop.   Edema:    Peripheral edema absent.  Abdominal:     General: There is distension.     Palpations: Abdomen is soft.  Skin:    General: Skin is warm and dry.  Neurological:     General: No focal deficit present.     Mental Status: Alert and oriented to person, place and time.     Cranial Nerves: Cranial nerves are intact.     ASSESSMENT & PLAN:  1. Acute on chronic heart failure with preserved ejection fraction (HCC) EF 50 by echocardiogram 8/21.  He is NYHA IIb.  He seems to be volume overloaded.  This was likely 2/2 a high salt load last night.  I will increase his Torsemide for diuresis and optimize his GDMT better with the addition of an SGLT2i.  We discussed the risks and benefits of SGLT2i in pt's with HFpEF.  We discussed the potential for GU infections.    -Continue current dose of Spironolactone  -Increase Torsemide to 40 mg in A and 20 mg in P x 2 days  -After 2 days, reduce Torsemide to 30 mg  once daily   -Start Empagliflozin 10 mg once daily   -BMET, BNP today  -BMET in 1 week  -F/u with me or Dr. [name] in 4-6 weeks.   -Low Na diet  2. Coronary artery disease involving native coronary artery of native heart without angina pectoris Status post multiple PCI procedures in the setting of ACS and ST elevation myocardial infarctions.   Cardiac catheterization December 2020 with chronically occluded RCA and no significant obstructive disease in the LCx or LAD.  He has been doing well.  He feels "heavy" today with his shortness of breath.  This seems to be likely due to volume excess. His EKG does not show any significant changes compared with multiple prior EKGs.  Continue current dose of aspirin, clopidogrel, carvedilol, isosorbide, rosuvastatin.  3. SVT (supraventricular tachycardia) ([place]) 4. PVC's (premature ventricular contractions) Overall controlled on beta-blocker therapy.  5. CKD (chronic kidney disease), stage II Obtain BMET today and again in 1 week with initiation of SGLT2 inhibitor.  6. Essential hypertension Blood pressure much better controlled.  Continue current therapy as outlined.  7. Mixed hyperlipidemia Recent LDL significantly elevated.  He does admit to missing medications at times.  We will renew his rosuvastatin.  At follow-up, we will try to arrange repeat fasting lipids.      Dispo:  Return in about 4 weeks (around 02/15/2021) for Routine Follow Up, w/ Dr. [name], or [name], PA-C.   Medication Adjustments/Labs and Tests Ordered: Current medicines are reviewed at length with the patient today.  Concerns regarding medicines are outlined above.  Tests Ordered: Orders Placed This Encounter  Procedures   Basic metabolic panel   Pro b natriuretic peptide (BNP)   Basic metabolic panel   EKG 65-LDJT   Medication Changes: Meds ordered this encounter  Medications   empagliflozin (JARDIANCE) 10 MG TABS tablet    Sig: Take 1 tablet (10 mg total) by  mouth daily before breakfast.    Dispense:  30 tablet    Refill:  11   torsemide (DEMADEX) 20 MG tablet    Sig: Take 1.5 tablets (30 mg total) by mouth 2 (two) times daily.    Dispense:  270 tablet    Refill:  3    Signed, [name], PA-C  01/18/2021 1:18 PM    [facility] [address], [place], [place]  [address] Phone: [phone]; Fax: [phone]

## 2021-01-18 ENCOUNTER — Encounter: Payer: Self-pay | Admitting: Physician Assistant

## 2021-01-18 ENCOUNTER — Ambulatory Visit (INDEPENDENT_AMBULATORY_CARE_PROVIDER_SITE_OTHER): Payer: Medicare HMO | Admitting: Physician Assistant

## 2021-01-18 ENCOUNTER — Other Ambulatory Visit: Payer: Self-pay

## 2021-01-18 VITALS — BP 102/80 | HR 64 | Ht 73.0 in | Wt 318.8 lb

## 2021-01-18 DIAGNOSIS — I10 Essential (primary) hypertension: Secondary | ICD-10-CM | POA: Diagnosis not present

## 2021-01-18 DIAGNOSIS — N182 Chronic kidney disease, stage 2 (mild): Secondary | ICD-10-CM

## 2021-01-18 DIAGNOSIS — E782 Mixed hyperlipidemia: Secondary | ICD-10-CM

## 2021-01-18 DIAGNOSIS — I493 Ventricular premature depolarization: Secondary | ICD-10-CM | POA: Diagnosis not present

## 2021-01-18 DIAGNOSIS — I5032 Chronic diastolic (congestive) heart failure: Secondary | ICD-10-CM

## 2021-01-18 DIAGNOSIS — I5033 Acute on chronic diastolic (congestive) heart failure: Secondary | ICD-10-CM

## 2021-01-18 DIAGNOSIS — I2510 Atherosclerotic heart disease of native coronary artery without angina pectoris: Secondary | ICD-10-CM | POA: Diagnosis not present

## 2021-01-18 DIAGNOSIS — R0602 Shortness of breath: Secondary | ICD-10-CM | POA: Diagnosis not present

## 2021-01-18 DIAGNOSIS — I471 Supraventricular tachycardia: Secondary | ICD-10-CM | POA: Diagnosis not present

## 2021-01-18 MED ORDER — EMPAGLIFLOZIN 10 MG PO TABS
10.0000 mg | ORAL_TABLET | Freq: Every day | ORAL | 11 refills | Status: DC
Start: 2021-01-18 — End: 2021-07-12

## 2021-01-18 MED ORDER — TORSEMIDE 20 MG PO TABS: 30.0000 mg | ORAL_TABLET | Freq: Two times a day (BID) | ORAL | 3 refills | Status: DC

## 2021-01-18 MED ORDER — TORSEMIDE 20 MG PO TABS: 30.0000 mg | ORAL_TABLET | Freq: Every day | ORAL | 3 refills | Status: DC

## 2021-01-18 NOTE — Addendum Note (Signed)
Addended by: Dollene Primrose on: 01/18/2021 01:34 PM   Modules accepted: Orders

## 2021-01-18 NOTE — Patient Instructions (Addendum)
Medication Instructions:   Take your Torsemide '40mg'$  (2 tabs) in the morning and '20mg'$  (1 tab) in the evening for today and tomorrow. Then drop down to '30mg'$  (1 1/2 tab) every day.   Begin Jardiance, '10mg'$  tablet, once daily  Labwork: BMP and BNP today  BMP next week  Testing/Procedures: None  Follow-Up: Your physician recommends that you schedule a follow-up appointment:  August 18- anytime between 7:30am and 4:30pm for lab work (BMP)  Sept 23 at 11:15am with [name], PA  Any Other Special Instructions Will Be Listed Below (If Applicable).  Check your blood pressures every day, 2 hours after taking your morning blood pressure medications. Report back to the office through [place] or telephone call with your measurements. Be sure to write them down everyday :)  Taking a daily weight can be a great way to determine if you are retaining fluid. Take and record your weight every morning, after you use the bathroom. Report weight gains greater than 2lbs in a day or 5lbs in a week.    If you need a refill on your cardiac medications before your next appointment, please call your pharmacy.  Low-Sodium Eating Plan Sodium, which is an element that makes up salt, helps you maintain a healthy balance of fluids in your body. Too much sodium can increase your bloodpressure and cause fluid and waste to be held in your body. Your health care provider or dietitian may recommend following this plan if you have high blood pressure (hypertension), kidney disease, liver disease, or heart failure. Eating less sodium can help lower your blood pressure, reduce swelling, and protect your heart, liver, andkidneys. What are tips for following this plan? Reading food labels The Nutrition Facts label lists the amount of sodium in one serving of the food. If you eat more than one serving, you must multiply the listed amount of sodium by the number of servings. Choose foods with less than 140 mg of sodium per  serving. Avoid foods with 300 mg of sodium or more per serving. Shopping  Look for lower-sodium products, often labeled as "low-sodium" or "no salt added." Always check the sodium content, even if foods are labeled as "unsalted" or "no salt added." Buy fresh foods. Avoid canned foods and pre-made or frozen meals. Avoid canned, cured, or processed meats. Buy breads that have less than 80 mg of sodium per slice.  Cooking  Eat more home-cooked food and less restaurant, buffet, and fast food. Avoid adding salt when cooking. Use salt-free seasonings or herbs instead of table salt or sea salt. Check with your health care provider or pharmacist before using salt substitutes. Cook with plant-based oils, such as canola, sunflower, or olive oil.  Meal planning When eating at a restaurant, ask that your food be prepared with less salt or no salt, if possible. Avoid dishes labeled as brined, pickled, cured, smoked, or made with soy sauce, miso, or teriyaki sauce. Avoid foods that contain MSG (monosodium glutamate). MSG is sometimes added to [place] food, bouillon, and some canned foods. Make meals that can be grilled, baked, poached, roasted, or steamed. These are generally made with less sodium. General information Most people on this plan should limit their sodium intake to 1,500-2,000 mg (milligrams) of sodium each day. What foods should I eat? Fruits Fresh, frozen, or canned fruit. Fruit juice. Vegetables Fresh or frozen vegetables. "No salt added" canned vegetables. "No salt added"tomato sauce and paste. Low-sodium or reduced-sodium tomato and vegetable juice. Grains Low-sodium cereals, including oats,  puffed wheat and rice, and shredded wheat. Low-sodium crackers. Unsalted rice. Unsalted pasta. Low-sodium bread.Whole-grain breads and whole-grain pasta. Meats and other proteins Fresh or frozen (no salt added) meat, poultry, seafood, and fish. Low-sodium canned tuna and salmon. Unsalted nuts.  Dried peas, beans, and lentils withoutadded salt. Unsalted canned beans. Eggs. Unsalted nut butters. Dairy Milk. Soy milk. Cheese that is naturally low in sodium, such as ricotta cheese, fresh mozzarella, or Swiss cheese. Low-sodium or reduced-sodium cheese. Creamcheese. Yogurt. Seasonings and condiments Fresh and dried herbs and spices. Salt-free seasonings. Low-sodium mustard and ketchup. Sodium-free salad dressing. Sodium-free light mayonnaise. Fresh orrefrigerated horseradish. Lemon juice. Vinegar. Other foods Homemade, reduced-sodium, or low-sodium soups. Unsalted popcorn and pretzels.Low-salt or salt-free chips. The items listed above may not be a complete list of foods and beverages you can eat. Contact a dietitian for more information. What foods should I avoid? Vegetables Sauerkraut, pickled vegetables, and relishes. Olives. [place] fries. Onion rings. Regular canned vegetables (not low-sodium or reduced-sodium). Regular canned tomato sauce and paste (not low-sodium or reduced-sodium). Regular tomato and vegetable juice (not low-sodium or reduced-sodium). Frozenvegetables in sauces. Grains Instant hot cereals. Bread stuffing, pancake, and biscuit mixes. Croutons. Seasoned rice or pasta mixes. Noodle soup cups. Boxed or frozen macaroni andcheese. Regular salted crackers. Self-rising flour. Meats and other proteins Meat or fish that is salted, canned, smoked, spiced, or pickled. Precooked or cured meat, such as sausages or meat loaves. [place] Salines. Ham. Pepperoni. Hot dogs. Corned beef. Chipped beef. Salt pork. Jerky. Pickled herring. Anchovies andsardines. Regular canned tuna. Salted nuts. Dairy Processed cheese and cheese spreads. Hard cheeses. Cheese curds. Blue cheese.Feta cheese. String cheese. Regular cottage cheese. Buttermilk. Canned milk. Fats and oils Salted butter. Regular margarine. Ghee. Bacon fat. Seasonings and condiments Onion salt, garlic salt, seasoned salt, table salt, and sea  salt. Canned and packaged gravies. Worcestershire sauce. Tartar sauce. Barbecue sauce. Teriyaki sauce. Soy sauce, including reduced-sodium. Steak sauce. Fish sauce. Oyster sauce. Cocktail sauce. Horseradish that you find on the shelf. Regular ketchup and mustard. Meat flavorings and tenderizers. Bouillon cubes. Hot sauce. Pre-made or packaged marinades. Pre-made or packaged taco seasonings. Relishes.Regular salad dressings. Salsa. Other foods Salted popcorn and pretzels. Corn chips and puffs. Potato and tortilla chips.Canned or dried soups. Pizza. Frozen entrees and pot pies. The items listed above may not be a complete list of foods and beverages you should avoid. Contact a dietitian for more information. Summary Eating less sodium can help lower your blood pressure, reduce swelling, and protect your heart, liver, and kidneys. Most people on this plan should limit their sodium intake to 1,500-2,000 mg (milligrams) of sodium each day. Canned, boxed, and frozen foods are high in sodium. Restaurant foods, fast foods, and pizza are also very high in sodium. You also get sodium by adding salt to food. Try to cook at home, eat more fresh fruits and vegetables, and eat less fast food and canned, processed, or prepared foods. This information is not intended to replace advice given to you by your health care provider. Make sure you discuss any questions you have with your healthcare provider. Document Revised: 07/03/2019 Document Reviewed: 04/29/2019 Elsevier Patient Education  2022 [employer].

## 2021-01-19 LAB — PRO B NATRIURETIC PEPTIDE: NT-Pro BNP: 243 pg/mL — ABNORMAL HIGH (ref 0–210)

## 2021-01-19 LAB — BASIC METABOLIC PANEL
BUN/Creatinine Ratio: 10 (ref 10–24)
BUN: 15 mg/dL (ref 8–27)
CO2: 26 mmol/L (ref 20–29)
Calcium: 9.7 mg/dL (ref 8.6–10.2)
Chloride: 102 mmol/L (ref 96–106)
Creatinine, Ser: 1.55 mg/dL — ABNORMAL HIGH (ref 0.76–1.27)
Glucose: 195 mg/dL — ABNORMAL HIGH (ref 65–99)
Potassium: 4.1 mmol/L (ref 3.5–5.2)
Sodium: 144 mmol/L (ref 134–144)
eGFR: 50 mL/min/{1.73_m2} — ABNORMAL LOW (ref >59)

## 2021-01-20 ENCOUNTER — Other Ambulatory Visit: Payer: Self-pay | Admitting: Internal Medicine

## 2021-01-20 ENCOUNTER — Other Ambulatory Visit: Payer: Self-pay | Admitting: Physician Assistant

## 2021-01-24 ENCOUNTER — Ambulatory Visit (INDEPENDENT_AMBULATORY_CARE_PROVIDER_SITE_OTHER): Payer: Medicare HMO | Admitting: Podiatry

## 2021-01-24 ENCOUNTER — Other Ambulatory Visit: Payer: Self-pay

## 2021-01-24 ENCOUNTER — Encounter: Payer: Self-pay | Admitting: Podiatry

## 2021-01-24 DIAGNOSIS — M79674 Pain in right toe(s): Secondary | ICD-10-CM | POA: Diagnosis not present

## 2021-01-24 DIAGNOSIS — M79675 Pain in left toe(s): Secondary | ICD-10-CM

## 2021-01-24 DIAGNOSIS — E1142 Type 2 diabetes mellitus with diabetic polyneuropathy: Secondary | ICD-10-CM

## 2021-01-24 DIAGNOSIS — B351 Tinea unguium: Secondary | ICD-10-CM | POA: Diagnosis not present

## 2021-01-24 DIAGNOSIS — N179 Acute kidney failure, unspecified: Secondary | ICD-10-CM

## 2021-01-24 DIAGNOSIS — L989 Disorder of the skin and subcutaneous tissue, unspecified: Secondary | ICD-10-CM

## 2021-01-24 NOTE — Progress Notes (Signed)
This patient returns to my office for at risk foot care.  This patient requires this care by a professional since this patient will be at risk due to having pre-ulcerative calluses, CKD and diabetic neuropathy and coagulation defect.  Patient is taking plavix.  This patient is unable to cut nails himself since the patient cannot reach his nails.These nails are painful walking and wearing shoes.  This patient presents for at risk foot care today.    General Appearance  Alert, conversant and in no acute stress.  Vascular  Dorsalis pedis and posterior tibial  pulses are  weakly  palpable  bilaterally.  Capillary return is within normal limits  bilaterally. Temperature is within normal limits  bilaterally.  Neurologic  Senn-Weinstein monofilament wire test diminished   bilaterally. Muscle power within normal limits bilaterally.  Nails Thick disfigured discolored nails with subungual debris  from hallux to fifth toes bilaterally. No evidence of bacterial infection or drainage bilaterally.  Orthopedic  No limitations of motion  feet .  No crepitus or effusions noted.  No bony pathology or digital deformities noted.  Plantar flexed second metatarsal  B/L.  Skin  normotropic skin  noted bilaterally.  No signs of infections or ulcers noted.   Pre-ulcerous callus sub 2  B/L.  Porokeratosis sub 5th  Left.  Onychomycosis  Pain in right toes  Pain in left toes  Porokeratosis/Pre-ulcerous callus  B/L.  Consent was obtained for treatment procedures.   Mechanical debridement of nails 1-5  bilaterally performed with a nail nipper.  Filed with dremel without incident. Porokeratosis debrided with # 15 blade.    Return office visit     3 months                Told patient to return for periodic foot care and evaluation due to potential at risk complications.   Chyan Carnero DPM  

## 2021-01-25 ENCOUNTER — Ambulatory Visit (INDEPENDENT_AMBULATORY_CARE_PROVIDER_SITE_OTHER): Payer: Medicare HMO | Admitting: *Deleted

## 2021-01-25 DIAGNOSIS — E1159 Type 2 diabetes mellitus with other circulatory complications: Secondary | ICD-10-CM

## 2021-01-25 DIAGNOSIS — I5042 Chronic combined systolic (congestive) and diastolic (congestive) heart failure: Secondary | ICD-10-CM | POA: Diagnosis not present

## 2021-01-25 NOTE — Patient Instructions (Signed)
Visit Information   [name] [name], it was nice talking with you today.   Please read over the attached information, and start now to monitor and write down on paper your blood sugars first thing in the morning before eating and then again later in the day 2-hours after eating; please also check your daily weights at home and write these down on paper also-- we will review all of these during each of our phone call appointments   I look forward to talking to you again for an update on Thursday, February 09, 2021 at 9:00 am- please be listening out for my call that day.  I will call as close to 9:00 am as possible.   If you need to cancel or re-schedule our telephone visit, please call [phone] and one of our care guides will be happy to assist you.   I look forward to hearing about your progress.   Please don't hesitate to contact me if I can be of assistance to you before our next scheduled telephone appointment.   [name], RN, BSN, [facility] RN Care Coordination- [place] [phone]: direct office [phone]: mobile   PATIENT GOALS:   Goals Addressed             This Visit's Progress    Patient Self-Care Activities   On track    Timeframe:  Long-Range Goal Priority:  High Start Date:      01/24/21                       Expected End Date:   01/24/22                    Patient will self administer medications as prescribed Patient will attend all scheduled provider appointments Patient will call pharmacy for medication refills Patient will call provider office for new concerns or questions Patient will begin checking and writing down his blood sugars first thing in the morning before eating and then again during the day 2 hours after eating a regular meal Patient will begin monitoring and writing down his weights at home every day         Hypoglycemia Hypoglycemia occurs when the level of sugar (glucose) in the blood is too low.  Hypoglycemia can happen in people who have or do not have diabetes. It can develop quickly, and it can be a medical emergency. For most people, a blood glucose level below 70 mg/dL (3.9 mmol/L) is consideredhypoglycemia. Glucose is a type of sugar that provides the body's main source of energy. Certain hormones (insulin and glucagon) control the level of glucose in the blood. Insulin lowers blood glucose, and glucagon raises blood glucose. Hypoglycemia can result from having too much insulin in the bloodstream, or from not eating enough food that contains glucose. You may also have reactive hypoglycemia, which happens within 4 hoursafter eating a meal. What are the causes? Hypoglycemia occurs most often in people who have diabetes and may be caused by: Diabetes medicine. Not eating enough, or not eating often enough. Increased physical activity. Drinking alcohol on an empty stomach. If you do not have diabetes, hypoglycemia may be caused by: A tumor in the pancreas. Not eating enough, or not eating for long periods at a time (fasting). A severe infection or illness. Problems after having bariatric surgery. Organ failure, such as kidney or liver failure. Certain medicines. What increases the risk? Hypoglycemia is more likely to  develop in people who: Have diabetes and take medicines to lower blood glucose. Abuse alcohol. Have a severe illness. What are the signs or symptoms? Symptoms vary depending on whether the condition is mild, moderate, or severe. Mild hypoglycemia Hunger. Sweating and feeling clammy. Dizziness or feeling light-headed. Sleepiness or restless sleep. Nausea. Increased heart rate. Headache. Blurry vision. Mood changes, such as irritability or anxiety. Tingling or numbness around the mouth, lips, or tongue. Moderate hypoglycemia Confusion and poor judgment. Behavior changes. Weakness. Irregular heartbeat. A change in coordination. Severe hypoglycemia Severe  hypoglycemia is a medical emergency. It can cause: Fainting. Seizures. Loss of consciousness (coma). Death. How is this diagnosed? Hypoglycemia is diagnosed with a blood test to measure your blood glucose level. This blood test is done while you are having symptoms. Your health careprovider may also do a physical exam and review your medical history. How is this treated? This condition can be treated by immediately eating or drinking something that contains sugar with 15 grams of fast-acting carbohydrate, such as: 4 oz (120 mL) of fruit juice. 4 oz (120 mL) of regular soda (not diet soda). Several pieces of hard candy. Check food labels to find out how many pieces to eat for 15 grams. 1 Tbsp (15 mL) of sugar or honey. 4 glucose tablets. 1 tube of glucose gel. Treating hypoglycemia if you have diabetes If you are alert and able to swallow safely, follow the 15:15 rule: Take 15 grams of a fast-acting carbohydrate. Talk with your health care provider about how much you should take. Options for getting 15 grams of fast-acting carbohydrate include: Glucose tablets (take 4 tablets). Several pieces of hard candy. Check food labels to find out how many pieces to eat for 15 grams. 4 oz (120 mL) of fruit juice. 4 oz (120 mL) of regular soda (not diet soda). 1 Tbsp (15 mL) of sugar or honey. 1 tube of glucose gel. Check your blood glucose 15 minutes after you take the carbohydrate. If the repeat blood glucose level is still at or below 70 mg/dL (3.9 mmol/L), take 15 grams of a carbohydrate again. If your blood glucose level does not increase above 70 mg/dL (3.9 mmol/L) after 3 tries, seek emergency medical care. After your blood glucose level returns to normal, eat a meal or a snack within 1 hour.  Treating severe hypoglycemia Severe hypoglycemia is when your blood glucose level is below 54 mg/dL (3 mmol/L). Severe hypoglycemia is a medical emergency. Get medical help right away. If you have  severe hypoglycemia and you cannot eat or drink, you will need to be given glucagon. A family member or close friend should learn how to check your blood glucose and how to give you glucagon. Ask your health care providerif you need to have an emergency glucagon kit available. Severe hypoglycemia may need to be treated in a hospital. The treatment may include getting glucose through an IV. You may also need treatment for thecause of your hypoglycemia. Follow these instructions at home:  General instructions Take over-the-counter and prescription medicines only as told by your health care provider. Monitor your blood glucose as told by your health care provider. If you drink alcohol: Limit how much you have to: 0-1 drink a day for women who are not pregnant. 0-2 drinks a day for men. Know how much alcohol is in your drink. In the [place] one drink equals one 12 oz bottle of beer (355 mL), one 5 oz glass of wine (148 mL), or one  1 oz glass of hard liquor (44 mL). Be sure to eat food along with drinking alcohol. Be aware that alcohol is absorbed quickly and may have lingering effects that may result in hypoglycemia later. Be sure to do ongoing glucose monitoring. Keep all follow-up visits. This is important. If you have diabetes: Always have a fast-acting carbohydrate (15 grams) option with you to treat low blood glucose. Follow your diabetes management plan as directed by your health care provider. Make sure you: Know the symptoms of hypoglycemia. It is important to treat it right away to prevent it from becoming severe. Check your blood glucose as often as told. Always check before and after exercise. Always check your blood glucose before you drive a motorized vehicle. Take your medicines as told. Follow your meal plan. Eat on time, and do not skip meals. Share your diabetes management plan with people in your workplace, school, and household. Carry a medical alert card or wear medical alert  jewelry. Where to find more information American Diabetes Association: www.diabetes.org Contact a health care provider if: You have problems keeping your blood glucose in your target range. You have frequent episodes of hypoglycemia. Get help right away if: You continue to have hypoglycemia symptoms after eating or drinking something that contains 15 grams of fast-acting carbohydrate, and you cannot get your blood glucose above 70 mg/dL (3.9 mmol/L) while following the 15:15 rule. Your blood glucose is below 54 mg/dL (3 mmol/L). You have a seizure. You faint. These symptoms may represent a serious problem that is an emergency. Do not wait to see if the symptoms will go away. Get medical help right away. Call your local emergency services (911 in the [place] Do not drive yourself to the hospital. Summary Hypoglycemia occurs when the level of sugar (glucose) in the blood is too low. Hypoglycemia can happen in people who have or do not have diabetes. It can develop quickly, and it can be a medical emergency. Make sure you know the symptoms of hypoglycemia and how to treat it. Always have a fast-acting carbohydrate option with you to treat low blood sugar. This information is not intended to replace advice given to you by your health care provider. Make sure you discuss any questions you have with your healthcare provider. Document Revised: 04/28/2020 Document Reviewed: 04/28/2020 Elsevier Patient Education  2022 [employer].  Heart Failure Action Plan A heart failure action plan helps you understand what to do when you have symptoms of heart failure. Your action plan is a color-coded plan that lists the symptoms to watch for and indicates what actions to take. If you have symptoms in the red zone, you need medical care right away. If you have symptoms in the yellow zone, you are having problems. If you have symptoms in the green zone, you are doing well. Follow the plan that was created by  you and your health care provider. Reviewyour plan each time you visit your health care provider. Red zone These signs and symptoms mean you should get medical help right away: You have trouble breathing when resting. You have a dry cough that is getting worse. You have swelling or pain in your legs or abdomen that is getting worse. You suddenly gain more than 2-3 lb (0.9-1.4 kg) in 24 hours, or more than 5 lb (2.3 kg) in a week. This amount may be more or less depending on your condition. You have trouble staying awake or you feel confused. You have chest pain. You do not  have an appetite. You pass out. You have worsening sadness or depression. If you have any of these symptoms, call your local emergency services (911 in the [place] right away. Do not drive yourself to the hospital. Yellow zone These signs and symptoms mean your condition may be getting worse and you should make some changes: You have trouble breathing when you are active, or you need to sleep with your head raised on extra pillows to help you breathe. You have swelling in your legs or abdomen. You gain 2-3 lb (0.9-1.4 kg) in 24 hours, or 5 lb (2.3 kg) in a week. This amount may be more or less depending on your condition. You get tired easily. You have trouble sleeping. You have a dry cough. If you have any of these symptoms: Contact your health care provider within the next day. Your health care provider may adjust your medicines. Green zone These signs mean you are doing well and can continue what you are doing: You do not have shortness of breath. You have very little swelling or no new swelling. Your weight is stable (no gain or loss). You have a normal activity level. You do not have chest pain or any other new symptoms. Follow these instructions at home: Take over-the-counter and prescription medicines only as told by your health care provider. Weigh yourself daily. Your target weight is __________ lb  (__________ kg). Call your health care provider if you gain more than __________ lb (__________ kg) in 24 hours, or more than __________ lb (__________ kg) in a week. Health care provider name: _____________________________________________________ Health care provider phone number: _____________________________________________________ Eat a heart-healthy diet. Work with a diet and nutrition specialist (dietitian) to create an eating plan that is best for you. Keep all follow-up visits. This is important. Where to find more information American Heart Association: www.heart.org Summary A heart failure action plan helps you understand what to do when you have symptoms of heart failure. Follow the action plan that was created by you and your health care provider. Get help right away if you have any symptoms in the red zone. This information is not intended to replace advice given to you by your health care provider. Make sure you discuss any questions you have with your healthcare provider. Document Revised: 01/11/2020 Document Reviewed: 01/11/2020 Elsevier Patient Education  2022 [employer].  Consent to CCM Services: [name] [name] was given information about Chronic Care Management services including:  CCM service includes personalized support from designated clinical staff supervised by his physician, including individualized plan of care and coordination with other care providers 24/7 contact phone numbers for assistance for urgent and routine care needs. Service will only be billed when office clinical staff spend 20 minutes or more in a month to coordinate care. Only one practitioner may furnish and bill the service in a calendar month. The patient may stop CCM services at any time (effective at the end of the month) by phone call to the office staff. The patient will be responsible for cost sharing (co-pay) of up to 20% of the service fee (after annual deductible is met).  Patient agreed to  services and verbal consent obtained.   The patient verbalized understanding of instructions, educational materials, and care plan provided today and agreed to receive a mailed copy of patient instructions, educational materials, and care plan.  Telephone follow up appointment with care management team member scheduled for:  Thursday, February 09, 2021 at 9:00 am The patient has been provided with  contact information for the care management team and has been advised to call with any health related questions or concerns.   [name], RN, BSN, [facility] RN Care Coordination- [place] [phone]: direct office [phone]: mobile    CLINICAL CARE PLAN: Patient Care Plan: RN Care Manager Plan of Care     Problem Identified: Chronic Disease Management Needs   Priority: High     Long-Range Goal: Development of plan of care for long term chronic disease management   Start Date: 01/25/2021  Expected End Date: 01/25/2022  Priority: High  Note:   Current Barriers:  Chronic Disease Management support and education needs related to CHF and DMII Literacy barriers- patient reports needs assistance understanding health care advice Has not obtained glucometer/ scales from insurance provider: confirmed patient has plan in place to obtain these items  RNCM Clinical Goal(s):  Patient will demonstrate improved adherence to prescribed treatment plan for CHF and DMII as evidenced by daily monitoring and recording of CBG  adherence to ADA/ carb modified diet adherence to prescribed medication regimen contacting provider for new or worsened symptoms or questions monitoring and recording daily weights at home    through collaboration with RN Care manager, provider, and care team.   Interventions: 1:1 collaboration with primary care provider regarding development and update of comprehensive plan of care as evidenced by provider attestation and  co-signature Inter-disciplinary care team collaboration (see longitudinal plan of care) Evaluation of current treatment plan related to  self management and patient's adherence to plan as established by provider  Heart Failure Interventions:  (Status: New goal.) Basic overview and discussion of pathophysiology of Heart Failure reviewed; Assessed need for readable accurate scales in home; Advised patient to weigh each morning after emptying bladder; Discussed importance of daily weight and advised patient to weigh and record daily; Discussed the importance of keeping all appointments with provider; Advised patient to discuss fluid restriction for individual needs in setting of CKDIII with provider; Screening for signs and symptoms of depression related to chronic disease state;  Assessed social determinant of health barriers;   Diabetes:  (Status: New goal.) Lab Results  Component Value Date   HGBA1C 12.1 Repeated and verified X2. (H) 12/26/2020  Assessed patient's understanding of A1c goal: <6.5% Provided education to patient about basic DM disease process; Counseled on importance of regular laboratory monitoring as prescribed;        Discussed plans with patient for ongoing care management follow up and provided patient with direct contact information for care management team;      Provided patient with written educational materials related to hypo and hyperglycemia and importance of correct treatment;       Reviewed scheduled/upcoming provider appointments including: 01/26/21- labs/ cardiology; 03/03/21 cardiology provider; 07/03/21 PCP;         Advised patient, providing education and rationale, to check cbg twice a day- fasting and 2-hours post-prandial and record        Assessed patient's current dietary habits and provided verbal education around strategies to begin incorporating in small steps to make a part of regular routine Reviewed patient's individual A1-C trends over last year;  discussed recent elevation in A1-C from 6.7 in January to 12.1 (12/26/20 PCP office visit) Confirmed patient has personal plan in place to obtain newly prescribed glucometer and begin monitoring/ recording blood sugars at home Reviewed with patient best times to monitor blood sugars at home to assess medication/ diet effectiveness; explained rationale for  timing of blood sugar monitoring at home Confirmed patient has obtained and is taking newly prescribed jardiance- reports he was supplied with coupons by cardiology provider, can afford "for now" -- possibility of pharmacy referral in future discussed with patient  Patient Goals/Self-Care Activities: Patient will self administer medications as prescribed Patient will attend all scheduled provider appointments Patient will call pharmacy for medication refills Patient will call provider office for new concerns or questions Patient will begin checking and writing down his blood sugars first thing in the morning before eating and then again during the day 2 hours after eating a regular meal Patient will begin monitoring and writing down his weights at home every day

## 2021-01-25 NOTE — Chronic Care Management (AMB) (Signed)
Chronic Care Management   CCM RN Visit Note  01/25/2021 Name: [name] [name] MRN: [id number] DOB: 1956-01-17  Subjective: [name] [name] is a 65 y.o. year old male who is a primary care patient of Biagio Borg, MD. The care management team was consulted for assistance with disease management and care coordination needs.    Engaged with patient by telephone for initial visit in response to provider referral for case management and/or care coordination services.   Consent to Services:  The patient was given information about Chronic Care Management services, agreed to services, and gave verbal consent 01/16/21 prior to initiation of services.  Please see initial visit note for detailed documentation.  Patient agreed to services and verbal consent obtained.   Assessment: Review of patient past medical history, allergies, medications, health status, including review of consultants reports, laboratory and other test data, was performed as part of comprehensive evaluation and provision of chronic care management services.   SDOH (Social Determinants of Health) assessments and interventions performed:  SDOH Interventions    Flowsheet Row Most Recent Value  SDOH Interventions   Food Insecurity Interventions Other (Comment)  [currently uses food banks through [employer] and [employer],  plans to apply soon for food stamps- knows resources]  Housing Interventions Intervention Not Indicated  Transportation Interventions Intervention Not Indicated  [Drives Self]       CCM Care Plan Allergies  Allergen Reactions   Penicillin G Other (See Comments)    Did it involve swelling of the face/tongue/throat, SOB, or low BP?  N/A Did it involve sudden or severe rash/hives, skin peeling, or any reaction on the inside of your mouth or nose? N/A Did you need to seek medical attention at a hospital or doctor's office? N/A When did it last happen? Child If all above answers are "NO", may proceed with  cephalosporin use.   Penicillins Other (See Comments)    Did it involve swelling of the face/tongue/throat, SOB, or low BP? N/A Did it involve sudden or severe rash/hives, skin peeling, or any reaction on the inside of your mouth or nose?N/A Did you need to seek medical attention at a hospital or doctor's office? N/A When did it last happen? Child      If all above answers are "NO", may proceed with cephalosporin use.   Outpatient Encounter Medications as of 01/25/2021  Medication Sig   Alcohol Swabs (B-D SINGLE USE SWABS BUTTERFLY) PADS Use up to 4 times a day to test blood sugars. Dx: E10.9, E11.9   aspirin 81 MG tablet Take 81 mg by mouth daily.   Blood Glucose Calibration (TRUE METRIX LEVEL 1) Low SOLN Use as needed. Dx: E10.9, E11.9   blood glucose meter kit and supplies Dispense based on patient and insurance preference. Use up to four times daily as directed. (FOR ICD-10 E10.9, E11.9).   carvedilol (COREG) 25 MG tablet Take 1 tablet (25 mg total) by mouth 2 (two) times daily with a meal.   clopidogrel (PLAVIX) 75 MG tablet Take 1 tablet (75 mg total) by mouth daily.   empagliflozin (JARDIANCE) 10 MG TABS tablet Take 1 tablet (10 mg total) by mouth daily before breakfast.   gabapentin (NEURONTIN) 300 MG capsule Take 300 mg by mouth 2 (two) times daily.   glucose blood (TRUE METRIX BLOOD GLUCOSE TEST) test strip Use to test blood sugars up to 4 times a day. DX: E10.9, E.11.9   isosorbide mononitrate (IMDUR) 30 MG 24 hr tablet TAKE 3 TABLETS(90 MG) BY  MOUTH DAILY   Lancets 33G MISC Use to test blood sugars up to 4 times a day. DX: E10.9, E11.9   Multiple Vitamins-Minerals (VITAMIN D3 COMPLETE PO) Take 1 tablet by mouth daily.   nitroGLYCERIN (NITROSTAT) 0.4 MG SL tablet PLACE 1 TABLET UNDER THE TONGUE EVERY 5 MINUTES AS NEEDED FOR CHEST PAIN   pantoprazole (PROTONIX) 40 MG tablet Take 1 tablet (40 mg total) by mouth daily.   PARoxetine (PAXIL) 20 MG tablet Take 1 tablet (20 mg total) by  mouth daily.   potassium chloride (KLOR-CON) 10 MEQ tablet Take 2 tablets by mouth in the morning and 1 tablet by mouth in the evening   rosuvastatin (CRESTOR) 40 MG tablet TAKE 1 TABLET(40 MG) BY MOUTH DAILY   spironolactone (ALDACTONE) 25 MG tablet Take 0.5 tablets (12.5 mg total) by mouth daily.   torsemide (DEMADEX) 20 MG tablet Take 1.5 tablets (30 mg total) by mouth daily.   traMADol (ULTRAM) 50 MG tablet TAKE 1 TABLET(50 MG) BY MOUTH EVERY 6 HOURS AS NEEDED   vitamin B-12 (CYANOCOBALAMIN) 1000 MCG tablet Take 1 tablet (1,000 mcg total) by mouth daily.   No facility-administered encounter medications on file as of 01/25/2021.   Patient Active Problem List   Diagnosis Date Noted   Aortic atherosclerosis ([place]) 12/26/2020   Cough 12/26/2020   Vitamin D deficiency 04/03/2020   B12 deficiency 04/03/2020   Itching 11/22/2019   Chronic heart failure with preserved ejection fraction (HFpEF) ([place]) 05/13/2019   Chronic coronary artery disease 05/13/2019   Elevated troponin 05/13/2019   Morbid obesity with BMI of 40.0-44.9, adult ([place]) 05/13/2019   Pre-ulcerative calluses 02/03/2019   Foot injury, left, initial encounter 11/26/2018   Chest pain 05/30/2017   Hypokalemia 05/30/2017   Allergic rhinitis 12/18/2016   Lumbar radiculitis 09/05/2016   Lumbar spinal stenosis 09/05/2016   CKD (chronic kidney disease), stage III ([place]) 12/29/2015   Chest pain syndrome 12/15/2015   Acute kidney injury ([place]) 12/15/2015   Atypical chest pain 12/15/2015   Encounter for well adult exam with abnormal findings 06/27/2015   Chronic low back pain 06/27/2015   Left lumbar radiculopathy 04/13/2015   Bilateral plantar fasciitis 04/13/2015   Essential hypertension 03/09/2015   OSA (obstructive sleep apnea) 03/27/2014   Peripheral neuropathy ([place]) 12/29/2013   External hemorrhoid, thrombosed 12/03/2013   Sinus bradycardia 11/15/2013   NSTEMI (non-ST elevated myocardial infarction) ([place]) 11/14/2013    Chronic combined systolic and diastolic CHF  66/11/43   CAD S/P multiple PCI    Hyperlipidemia    DM2 (diabetes mellitus, type 2) ([place]) 06/26/2013   Gait disorder 06/26/2013   Obesity, Class III, BMI 40-49.9 (morbid obesity) ([place]) 09/10/2012   Tobacco abuse 09/02/2012   Ischemic cardiomyopathy- EF 50-55% June 2015    ST elevation myocardial infarction (STEMI) of anterior wall, initial episode of care ([place]) 09/01/2012   HEMORRHOIDS 12/26/2009   Acute prostatitis 12/26/2009   GANGLION CYST, WRIST, RIGHT 11/28/2009   COLONIC POLYPS 10/07/2009   Anxiety with depression 07/21/2009   DYSPNEA 03/24/2009   GASTROESOPHAGEAL REFLUX DISEASE 01/27/2007   PERCUTANEOUS TRANSLUMINAL CORONARY ANGIOPLASTY, HX OF 02/09/2006   Conditions to be addressed/monitored:  CHF and DMII  Care Plan : RN Care Manager Plan of Care  Updates made by Knox Royalty, RN since 01/25/2021 12:00 AM     Problem: Chronic Disease Management Needs   Priority: High     Long-Range Goal: Development of plan of care for long term chronic disease management   Start  Date: 01/25/2021  Expected End Date: 01/25/2022  Priority: High  Note:   Current Barriers:  Chronic Disease Management support and education needs related to CHF and DMII Literacy barriers- patient reports needs assistance understanding health care advice Has not obtained glucometer/ scales from insurance provider: confirmed patient has plan in place to obtain these items  RNCM Clinical Goal(s):  Patient will demonstrate improved adherence to prescribed treatment plan for CHF and DMII as evidenced by daily monitoring and recording of CBG  adherence to ADA/ carb modified diet adherence to prescribed medication regimen contacting provider for new or worsened symptoms or questions monitoring and recording daily weights at home    through collaboration with RN Care manager, provider, and care team.   Interventions: 1:1 collaboration with primary care provider  regarding development and update of comprehensive plan of care as evidenced by provider attestation and co-signature Inter-disciplinary care team collaboration (see longitudinal plan of care) Evaluation of current treatment plan related to  self management and patient's adherence to plan as established by provider  Heart Failure Interventions:  (Status: New goal.) Basic overview and discussion of pathophysiology of Heart Failure reviewed; Assessed need for readable accurate scales in home; Advised patient to weigh each morning after emptying bladder; Discussed importance of daily weight and advised patient to weigh and record daily; Discussed the importance of keeping all appointments with provider; Advised patient to discuss fluid restriction for individual needs in setting of CKDIII with provider; Screening for signs and symptoms of depression related to chronic disease state;  Assessed social determinant of health barriers;   Diabetes:  (Status: New goal.) Lab Results  Component Value Date   HGBA1C 12.1 Repeated and verified X2. (H) 12/26/2020  Assessed patient's understanding of A1c goal: <6.5% Provided education to patient about basic DM disease process; Counseled on importance of regular laboratory monitoring as prescribed;        Discussed plans with patient for ongoing care management follow up and provided patient with direct contact information for care management team;      Provided patient with written educational materials related to hypo and hyperglycemia and importance of correct treatment;       Reviewed scheduled/upcoming provider appointments including: 01/26/21- labs/ cardiology; 03/03/21 cardiology provider; 07/03/21 PCP;         Advised patient, providing education and rationale, to check cbg twice a day- fasting and 2-hours post-prandial and record        Assessed patient's current dietary habits and provided verbal education around strategies to begin incorporating in  small steps to make a part of regular routine Reviewed patient's individual A1-C trends over last year; discussed recent elevation in A1-C from 6.7 in January to 12.1 (12/26/20 PCP office visit) Confirmed patient has personal plan in place to obtain newly prescribed glucometer and begin monitoring/ recording blood sugars at home Reviewed with patient best times to monitor blood sugars at home to assess medication/ diet effectiveness; explained rationale for timing of blood sugar monitoring at home Confirmed patient has obtained and is taking newly prescribed jardiance- reports he was supplied with coupons by cardiology provider, can afford "for now" -- possibility of pharmacy referral in future discussed with patient  Patient Goals/Self-Care Activities: Patient will self administer medications as prescribed Patient will attend all scheduled provider appointments Patient will call pharmacy for medication refills Patient will call provider office for new concerns or questions Patient will begin checking and writing down his blood sugars first thing in the morning before eating and  then again during the day 2 hours after eating a regular meal Patient will begin monitoring and writing down his weights at home every day      Plan: Telephone follow up appointment with care management team member scheduled for:  Thursday, February 09, 2021 at 9:00 am The patient has been provided with contact information for the care management team and has been advised to call with any health related questions or concerns.   [name], RN, BSN, [facility] RN Care Coordination- [place] [phone]: direct office [phone]: mobile

## 2021-01-26 ENCOUNTER — Other Ambulatory Visit: Payer: Medicare HMO

## 2021-01-26 ENCOUNTER — Other Ambulatory Visit: Payer: Self-pay

## 2021-01-26 DIAGNOSIS — N182 Chronic kidney disease, stage 2 (mild): Secondary | ICD-10-CM | POA: Diagnosis not present

## 2021-01-26 DIAGNOSIS — I10 Essential (primary) hypertension: Secondary | ICD-10-CM | POA: Diagnosis not present

## 2021-01-26 DIAGNOSIS — R0602 Shortness of breath: Secondary | ICD-10-CM | POA: Diagnosis not present

## 2021-01-26 LAB — BASIC METABOLIC PANEL
BUN/Creatinine Ratio: 10 (ref 10–24)
BUN: 14 mg/dL (ref 8–27)
CO2: 24 mmol/L (ref 20–29)
Calcium: 9.8 mg/dL (ref 8.6–10.2)
Chloride: 101 mmol/L (ref 96–106)
Creatinine, Ser: 1.46 mg/dL — ABNORMAL HIGH (ref 0.76–1.27)
Glucose: 142 mg/dL — ABNORMAL HIGH (ref 65–99)
Potassium: 4.2 mmol/L (ref 3.5–5.2)
Sodium: 141 mmol/L (ref 134–144)
eGFR: 54 mL/min/{1.73_m2} — ABNORMAL LOW (ref >59)

## 2021-01-30 ENCOUNTER — Telehealth: Payer: Self-pay | Admitting: Internal Medicine

## 2021-01-30 NOTE — Telephone Encounter (Signed)
1.Medication Requested: gabapentin (NEURONTIN) 300 MG capsule  2. Pharmacy (Name, [place], [facility]): [facility] [phone] - [place], [place] - [address] Phone:  [phone]  Fax:  [phone]     3. On Med List: Y  4. Last Visit with PCP: 12/26/2020  5. Next visit date with PCP: 07/03/2021   Agent: Please be advised that RX refills may take up to 3 business days. We ask that you follow-up with your pharmacy.

## 2021-01-31 ENCOUNTER — Other Ambulatory Visit: Payer: Self-pay | Admitting: Internal Medicine

## 2021-01-31 NOTE — Telephone Encounter (Signed)
Medication refilled to the pharmacy requested

## 2021-02-02 ENCOUNTER — Telehealth: Payer: Self-pay

## 2021-02-02 ENCOUNTER — Emergency Department (HOSPITAL_COMMUNITY): Payer: Medicare HMO

## 2021-02-02 ENCOUNTER — Encounter (HOSPITAL_COMMUNITY): Payer: Self-pay

## 2021-02-02 ENCOUNTER — Emergency Department (HOSPITAL_COMMUNITY)
Admission: EM | Admit: 2021-02-02 | Discharge: 2021-02-02 | Disposition: A | Discharge status: Home / Self Care | Payer: Medicare HMO | Attending: Emergency Medicine | Admitting: Emergency Medicine

## 2021-02-02 ENCOUNTER — Other Ambulatory Visit: Payer: Self-pay

## 2021-02-02 DIAGNOSIS — I110 Hypertensive heart disease with heart failure: Secondary | ICD-10-CM | POA: Diagnosis not present

## 2021-02-02 DIAGNOSIS — R069 Unspecified abnormalities of breathing: Secondary | ICD-10-CM | POA: Diagnosis not present

## 2021-02-02 DIAGNOSIS — R0602 Shortness of breath: Secondary | ICD-10-CM | POA: Diagnosis present

## 2021-02-02 DIAGNOSIS — I5023 Acute on chronic systolic (congestive) heart failure: Secondary | ICD-10-CM | POA: Insufficient documentation

## 2021-02-02 DIAGNOSIS — I10 Essential (primary) hypertension: Secondary | ICD-10-CM | POA: Diagnosis not present

## 2021-02-02 DIAGNOSIS — N183 Chronic kidney disease, stage 3 unspecified: Secondary | ICD-10-CM | POA: Diagnosis not present

## 2021-02-02 DIAGNOSIS — Z7982 Long term (current) use of aspirin: Secondary | ICD-10-CM | POA: Insufficient documentation

## 2021-02-02 DIAGNOSIS — I517 Cardiomegaly: Secondary | ICD-10-CM | POA: Diagnosis not present

## 2021-02-02 DIAGNOSIS — I509 Heart failure, unspecified: Secondary | ICD-10-CM

## 2021-02-02 DIAGNOSIS — J811 Chronic pulmonary edema: Secondary | ICD-10-CM | POA: Diagnosis not present

## 2021-02-02 DIAGNOSIS — Z87891 Personal history of nicotine dependence: Secondary | ICD-10-CM | POA: Insufficient documentation

## 2021-02-02 DIAGNOSIS — R0600 Dyspnea, unspecified: Secondary | ICD-10-CM | POA: Diagnosis not present

## 2021-02-02 DIAGNOSIS — E119 Type 2 diabetes mellitus without complications: Secondary | ICD-10-CM | POA: Insufficient documentation

## 2021-02-02 DIAGNOSIS — I2510 Atherosclerotic heart disease of native coronary artery without angina pectoris: Secondary | ICD-10-CM | POA: Insufficient documentation

## 2021-02-02 DIAGNOSIS — I130 Hypertensive heart and chronic kidney disease with heart failure and stage 1 through stage 4 chronic kidney disease, or unspecified chronic kidney disease: Secondary | ICD-10-CM | POA: Diagnosis not present

## 2021-02-02 LAB — COMPREHENSIVE METABOLIC PANEL
ALT: 21 U/L (ref 0–44)
AST: 26 U/L (ref 15–41)
Albumin: 3.7 g/dL (ref 3.5–5.0)
Alkaline Phosphatase: 61 U/L (ref 38–126)
Anion gap: 10 (ref 5–15)
BUN: 24 mg/dL — ABNORMAL HIGH (ref 8–23)
CO2: 24 mmol/L (ref 22–32)
Calcium: 9.4 mg/dL (ref 8.9–10.3)
Chloride: 105 mmol/L (ref 98–111)
Creatinine, Ser: 1.35 mg/dL — ABNORMAL HIGH (ref 0.61–1.24)
GFR, Estimated: 59 mL/min — ABNORMAL LOW (ref >60)
Glucose, Bld: 154 mg/dL — ABNORMAL HIGH (ref 70–99)
Potassium: 4 mmol/L (ref 3.5–5.1)
Sodium: 139 mmol/L (ref 135–145)
Total Bilirubin: 0.8 mg/dL (ref 0.3–1.2)
Total Protein: 7 g/dL (ref 6.5–8.1)

## 2021-02-02 LAB — CBC WITH DIFFERENTIAL/PLATELET
Abs Immature Granulocytes: 0.01 10*3/uL (ref 0.00–0.07)
Basophils Absolute: 0 10*3/uL (ref 0.0–0.1)
Basophils Relative: 0 %
Eosinophils Absolute: 0.1 10*3/uL (ref 0.0–0.5)
Eosinophils Relative: 1 %
HCT: 48.3 % (ref 39.0–52.0)
Hemoglobin: 15.9 g/dL (ref 13.0–17.0)
Immature Granulocytes: 0 %
Lymphocytes Relative: 32 %
Lymphs Abs: 2.4 10*3/uL (ref 0.7–4.0)
MCH: 29.6 pg (ref 26.0–34.0)
MCHC: 32.9 g/dL (ref 30.0–36.0)
MCV: 89.8 fL (ref 80.0–100.0)
Monocytes Absolute: 0.7 10*3/uL (ref 0.1–1.0)
Monocytes Relative: 10 %
Neutro Abs: 4.3 10*3/uL (ref 1.7–7.7)
Neutrophils Relative %: 57 %
Platelets: 229 10*3/uL (ref 150–400)
RBC: 5.38 MIL/uL (ref 4.22–5.81)
RDW: 14.5 % (ref 11.5–15.5)
WBC: 7.6 10*3/uL (ref 4.0–10.5)
nRBC: 0 % (ref 0.0–0.2)

## 2021-02-02 LAB — TROPONIN I (HIGH SENSITIVITY): Troponin I (High Sensitivity): 42 ng/L — ABNORMAL HIGH (ref <18)

## 2021-02-02 LAB — BRAIN NATRIURETIC PEPTIDE: B Natriuretic Peptide: 178.9 pg/mL — ABNORMAL HIGH (ref 0.0–100.0)

## 2021-02-02 MED ORDER — HYDROCORTISONE 1 % EX CREA: TOPICAL_CREAM | CUTANEOUS | 0 refills | Status: DC

## 2021-02-02 MED ORDER — PANTOPRAZOLE SODIUM 40 MG PO TBEC: 40.0000 mg | DELAYED_RELEASE_TABLET | Freq: Every day | ORAL | 3 refills | Status: DC

## 2021-02-02 MED ORDER — CLOPIDOGREL BISULFATE 75 MG PO TABS: 75.0000 mg | ORAL_TABLET | Freq: Every day | ORAL | 3 refills | Status: DC

## 2021-02-02 NOTE — Addendum Note (Signed)
Addended by: Biagio Borg on: 02/02/2021 01:16 PM   Modules accepted: Orders

## 2021-02-02 NOTE — Discharge Instructions (Addendum)
If you develop new or worsening shortness of breath, chest pain, fever, leg swelling, or any other new/concerning symptoms then return to the ER for evaluation.

## 2021-02-02 NOTE — ED Provider Notes (Signed)
[facility] EMERGENCY DEPARTMENT Provider Note   CSN: [id number] Arrival date & time: 02/02/21  5784     History Chief Complaint  Patient presents with   Shortness of Breath    [name] [name] is a 65 y.o. male.  HPI 65 year old male presents with shortness of breath.  This started around 1 AM.  He states that last night he thinks he might of drank too much water and may be some salt with soda.  Patient states that he got short of breath when he tried to go have a bowel movement at 1 AM and has been dealing with a hemorrhoid.  Ever since that has been short of breath until EMS gave 5 mg albuterol.  He states that shortness of breath seems to have resolved.  Prior to me seeing him he walked to the bathroom without issues.  He denies any leg swelling, cough, fever, chest pain.  Past Medical History:  Diagnosis Date   CAD (coronary artery disease)    a. s/p BMS pRCA 2002; b. DES to pRCA & DES p/m RCA 2006; c. PCI/DES OM4 2007; d. PCI/CBA to RCA for ISR 10/2006; e.08/2012 STEMI/Cath/PCI:  LAD 95% >> PCI: Promus Prem DES  //  f. NSTEMI 10/15 >> LHC: mLAD stent ok, dLAD 70, OM1 CTO, OM2 stent ok, dOM2 90, OM3 30-40, dLCx 90, p-mRCA stent ok, mRCA stent ok w/ 60-70 ISR, EF 50% >> med Rx // MV 1/17: no ischemia // Cath ([facility]) 05/2019: RCA 100 (CTO)    Combined systolic and diastolic CHF    a. Myoview 1/17: EF 35%  //  b. Echo 1/17 EF 45-50% // Echo 05/2019: EF 50-55 // Echocardiogram 8/21: EF 50, inf AK, post and mid inf HK, mod LVH, RVSP 25.3, mild to mod LAE, trivial MR    Depression    Diabetes mellitus ([place])    a. A1c 8.8 08/2012->Metformin initiated. => b. A1c (9/14): 6.6   Gastroesophageal reflux disease    History of nuclear stress test    a. Myoview 1/17: EF 35%, fixed inferior lateral defect consistent with infarct, no ischemia, intermediate risk   History of pneumonia    HTN (hypertension)    Hx of NSTEMI    Hyperlipidemia    Ischemic cardiomyopathy    a. EF 40%;  improved to normal;  b. 08/2012 Echo: EF 50-55%, mod LVH.//  c. Echo 9/16: Inf HK, mild LVH, EF 55%, mild LAE, normal RVF, mild RAE, PASP 35 mmHg  //  d. Echo 1/17: EF 45-50%, inferior HK, mild BAE, PASP 33 mmHg   Obesity    OSA (obstructive sleep apnea)    Does not use CPAP as of 05/2011   Tobacco abuse     Patient Active Problem List   Diagnosis Date Noted   Aortic atherosclerosis ([place]) 12/26/2020   Cough 12/26/2020   Vitamin D deficiency 04/03/2020   B12 deficiency 04/03/2020   Itching 11/22/2019   Chronic heart failure with preserved ejection fraction (HFpEF) ([place]) 05/13/2019   Chronic coronary artery disease 05/13/2019   Elevated troponin 05/13/2019   Morbid obesity with BMI of 40.0-44.9, adult ([place]) 05/13/2019   Pre-ulcerative calluses 02/03/2019   Foot injury, left, initial encounter 11/26/2018   Chest pain 05/30/2017   Hypokalemia 05/30/2017   Allergic rhinitis 12/18/2016   Lumbar radiculitis 09/05/2016   Lumbar spinal stenosis 09/05/2016   CKD (chronic kidney disease), stage III ([place]) 12/29/2015   Chest pain syndrome 12/15/2015   Acute kidney injury ([place])  12/15/2015   Atypical chest pain 12/15/2015   Encounter for well adult exam with abnormal findings 06/27/2015   Chronic low back pain 06/27/2015   Left lumbar radiculopathy 04/13/2015   Bilateral plantar fasciitis 04/13/2015   Essential hypertension 03/09/2015   OSA (obstructive sleep apnea) 03/27/2014   Peripheral neuropathy ([place]) 12/29/2013   External hemorrhoid, thrombosed 12/03/2013   Sinus bradycardia 11/15/2013   NSTEMI (non-ST elevated myocardial infarction) ([place]) 11/14/2013   Chronic combined systolic and diastolic CHF  38/46/6599   CAD S/P multiple PCI    Hyperlipidemia    DM2 (diabetes mellitus, type 2) ([place]) 06/26/2013   Gait disorder 06/26/2013   Obesity, Class III, BMI 40-49.9 (morbid obesity) ([place]) 09/10/2012   Tobacco abuse 09/02/2012   Ischemic cardiomyopathy- EF 50-55% June 2015    ST elevation  myocardial infarction (STEMI) of anterior wall, initial episode of care ([place]) 09/01/2012   HEMORRHOIDS 12/26/2009   Acute prostatitis 12/26/2009   GANGLION CYST, WRIST, RIGHT 11/28/2009   COLONIC POLYPS 10/07/2009   Anxiety with depression 07/21/2009   DYSPNEA 03/24/2009   GASTROESOPHAGEAL REFLUX DISEASE 01/27/2007   PERCUTANEOUS TRANSLUMINAL CORONARY ANGIOPLASTY, HX OF 02/09/2006    Past Surgical History:  Procedure Laterality Date   CORONARY ANGIOPLASTY WITH STENT PLACEMENT     CORONARY STENT PLACEMENT  2009   LEFT HEART CATHETERIZATION WITH CORONARY ANGIOGRAM N/A 08/31/2012   Procedure: LEFT HEART CATHETERIZATION WITH CORONARY ANGIOGRAM;  Surgeon: Burnell Blanks, MD;  Location: [facility] CATH LAB;  Service: Cardiovascular;  Laterality: N/A;   LEFT HEART CATHETERIZATION WITH CORONARY ANGIOGRAM N/A 11/16/2013   Procedure: LEFT HEART CATHETERIZATION WITH CORONARY ANGIOGRAM;  Surgeon: Blane Ohara, MD;  Location: [facility] CATH LAB;  Service: Cardiovascular;  Laterality: N/A;   LEFT HEART CATHETERIZATION WITH CORONARY ANGIOGRAM N/A 03/15/2014   Procedure: LEFT HEART CATHETERIZATION WITH CORONARY ANGIOGRAM;  Surgeon: Peter M Martinique, MD;  Location: [facility] CATH LAB;  Service: Cardiovascular;  Laterality: N/A;   PERCUTANEOUS CORONARY STENT INTERVENTION (PCI-S) Right 08/31/2012   Procedure: PERCUTANEOUS CORONARY STENT INTERVENTION (PCI-S);  Surgeon: Burnell Blanks, MD;  Location: [facility] CATH LAB;  Service: Cardiovascular;  Laterality: Right;   PERCUTANEOUS CORONARY STENT INTERVENTION (PCI-S)  11/16/2013   Procedure: PERCUTANEOUS CORONARY STENT INTERVENTION (PCI-S);  Surgeon: Blane Ohara, MD;  Location: [facility] CATH LAB;  Service: Cardiovascular;;       Family History  Problem Relation Age of Onset   Depression Mother    Cancer Mother        ovarian   Hypertension Mother        Died, 64   Other Father        motor vehicle accident   Coronary artery disease Other    Heart attack Neg Hx     Stroke Neg Hx     Social History   Tobacco Use   Smoking status: Former    Packs/day: 0.50    Years: 30.00    Pack years: 15.00    Types: Cigarettes   Smokeless tobacco: Never   Tobacco comments:    trying to cut down  Vaping Use   Vaping Use: Never used  Substance Use Topics   Alcohol use: No   Drug use: No    Comment: remote marijuana use    Home Medications Prior to Admission medications   Medication Sig Start Date End Date Taking? Authorizing Provider  hydrocortisone cream 1 % Apply to affected area 2 times daily 02/02/21  Yes Sherwood Gambler, MD  Alcohol Swabs (B-D SINGLE  USE SWABS BUTTERFLY) PADS Use up to 4 times a day to test blood sugars. Dx: E10.9, E11.9 09/30/20   Biagio Borg, MD  aspirin 81 MG tablet Take 81 mg by mouth daily.    [provider]  Blood Glucose Calibration (TRUE METRIX LEVEL 1) Low SOLN Use as needed. Dx: E10.9, E11.9 01/12/21   Biagio Borg, MD  blood glucose meter kit and supplies Dispense based on patient and insurance preference. Use up to four times daily as directed. (FOR ICD-10 E10.9, E11.9). 09/30/20   Biagio Borg, MD  carvedilol (COREG) 25 MG tablet Take 1 tablet (25 mg total) by mouth 2 (two) times daily with a meal. 02/23/20   [name] T, PA-C  clopidogrel (PLAVIX) 75 MG tablet Take 1 tablet (75 mg total) by mouth daily. 11/20/19   Biagio Borg, MD  empagliflozin (JARDIANCE) 10 MG TABS tablet Take 1 tablet (10 mg total) by mouth daily before breakfast. 01/18/21   [name] T, PA-C  gabapentin (NEURONTIN) 300 MG capsule TAKE 1 CAPSULE(300 MG) BY MOUTH THREE TIMES DAILY 01/31/21   Biagio Borg, MD  glucose blood (TRUE METRIX BLOOD GLUCOSE TEST) test strip Use to test blood sugars up to 4 times a day. DX: E10.9, E.11.9 12/26/20   Biagio Borg, MD  isosorbide mononitrate (IMDUR) 30 MG 24 hr tablet TAKE 3 TABLETS(90 MG) BY MOUTH DAILY 11/01/20   Biagio Borg, MD  Lancets 33G MISC Use to test blood sugars up to 4 times a day. DX:  E10.9, E11.9 09/08/20   Biagio Borg, MD  Multiple Vitamins-Minerals (VITAMIN D3 COMPLETE PO) Take 1 tablet by mouth daily.    [provider]  nitroGLYCERIN (NITROSTAT) 0.4 MG SL tablet PLACE 1 TABLET UNDER THE TONGUE EVERY 5 MINUTES AS NEEDED FOR CHEST PAIN 11/20/19   Biagio Borg, MD  pantoprazole (PROTONIX) 40 MG tablet Take 1 tablet (40 mg total) by mouth daily. 11/20/19   Biagio Borg, MD  PARoxetine (PAXIL) 20 MG tablet Take 1 tablet (20 mg total) by mouth daily. 06/23/20   Biagio Borg, MD  potassium chloride (KLOR-CON) 10 MEQ tablet Take 2 tablets by mouth in the morning and 1 tablet by mouth in the evening 05/27/20   [name] T, PA-C  rosuvastatin (CRESTOR) 40 MG tablet TAKE 1 TABLET(40 MG) BY MOUTH DAILY 01/20/21   [name] T, PA-C  spironolactone (ALDACTONE) 25 MG tablet Take 0.5 tablets (12.5 mg total) by mouth daily. 02/23/20   [name] T, PA-C  torsemide (DEMADEX) 20 MG tablet Take 1.5 tablets (30 mg total) by mouth daily. 01/18/21   [name] T, PA-C  traMADol (ULTRAM) 50 MG tablet TAKE 1 TABLET(50 MG) BY MOUTH EVERY 6 HOURS AS NEEDED 01/20/21   Biagio Borg, MD  vitamin B-12 (CYANOCOBALAMIN) 1000 MCG tablet Take 1 tablet (1,000 mcg total) by mouth daily. 11/25/19   Biagio Borg, MD    Allergies    Penicillin g and Penicillins  Review of Systems   Review of Systems  Constitutional:  Negative for fever.  Respiratory:  Positive for shortness of breath. Negative for cough.   Cardiovascular:  Negative for chest pain and leg swelling.  All other systems reviewed and are negative.  Physical Exam Updated Vital Signs BP (!) 153/87   Pulse 60   Temp 97.7 F (36.5 C) (Oral)   Resp 18   Ht _0  (1.854 m)   Wt (!) 144.2  kg   SpO2 96%   BMI 41.96 kg/m   Physical Exam Vitals and nursing note reviewed.  Constitutional:      General: He is not in acute distress.    Appearance: He is well-developed. He is obese. He is not ill-appearing or diaphoretic.   HENT:     Head: Normocephalic and atraumatic.     Right Ear: External ear normal.     Left Ear: External ear normal.     Nose: Nose normal.  Eyes:     General:        Right eye: No discharge.        Left eye: No discharge.  Cardiovascular:     Rate and Rhythm: Normal rate and regular rhythm.     Heart sounds: Normal heart sounds.  Pulmonary:     Effort: Pulmonary effort is normal.     Breath sounds: Normal breath sounds. No wheezing, rhonchi or rales.  Abdominal:     General: There is no distension.  Genitourinary:    Comments: Deferred by patient Musculoskeletal:     Cervical back: Neck supple.     Right lower leg: No edema.     Left lower leg: No edema.  Skin:    General: Skin is warm and dry.  Neurological:     Mental Status: He is alert.  Psychiatric:        Mood and Affect: Mood is not anxious.    ED Results / Procedures / Treatments   Labs (all labs ordered are listed, but only abnormal results are displayed) Labs Reviewed - No data to display  EKG EKG Interpretation  Date/Time:  Thursday February 02 2021 08:27:03 EDT Ventricular Rate:  61 PR Interval:  158 QRS Duration: 176 QT Interval:  493 QTC Calculation: 497 R Axis:   -77 Text Interpretation: Sinus rhythm Right bundle branch block no significant change since 2021 Confirmed by Sherwood Gambler [phone]) on 02/02/2021 8:28:56 AM  Radiology No results found.  Procedures Procedures   Medications Ordered in ED Medications - No data to display  ED Course  I have reviewed the triage vital signs and the nursing notes.  Pertinent labs & imaging results that were available during my care of the patient were reviewed by me and considered in my medical decision making (see chart for details).    MDM Rules/Calculators/A&P                           Patient feels like his symptoms have significantly improved since the albuterol given by EMS.  Overall his clinical picture is probably some mild CHF given he  drank more than he thought yesterday and may be some increased salt load.  However he feels well enough for discharge now.  He does not want to wait on second troponin, which is reasonable and it is unlikely that he is having ACS as a cause of his shortness of breath this morning.  Troponin is mildly elevated but similar with prior symptoms.  Otherwise, he does endorse having hemorrhoid that is uncomfortable and defers GU exam but would like Preparation H prescription.  He otherwise feels ready to go and was given return precautions. Final Clinical Impression(s) / ED Diagnoses Final diagnoses:  Acute on chronic congestive heart failure, unspecified heart failure type ([place])    Rx / DC Orders ED Discharge Orders          Ordered    hydrocortisone cream 1 %  02/02/21 [place], MD 02/02/21 1044

## 2021-02-02 NOTE — Telephone Encounter (Signed)
Refill request faxed from [facility] on [address]. Last OV 12/26/20, medication not addressed. Medication have not been refilled since 11/20/19. Will send to PCP for approval.

## 2021-02-02 NOTE — ED Notes (Signed)
Pt returned from X-ray.  

## 2021-02-02 NOTE — ED Notes (Signed)
Patient transported to X-ray 

## 2021-02-02 NOTE — ED Triage Notes (Signed)
Pt BIB [facility] from home c/o [facility] that started a few hours ago. Pt does have a hx of heart failure and took his lasix this morning but SHOB has since gotten worse. Pt states it is worse with movement. Pt was ambulatory from the bridge to the room. Pt was given '5mg'$  of albuterol en route.

## 2021-02-03 ENCOUNTER — Ambulatory Visit: Payer: Medicare HMO | Admitting: Podiatry

## 2021-02-03 ENCOUNTER — Other Ambulatory Visit: Payer: Self-pay | Admitting: *Deleted

## 2021-02-03 ENCOUNTER — Telehealth: Payer: Self-pay | Admitting: Physician Assistant

## 2021-02-03 DIAGNOSIS — I5033 Acute on chronic diastolic (congestive) heart failure: Secondary | ICD-10-CM

## 2021-02-03 MED ORDER — POTASSIUM CHLORIDE ER 10 MEQ PO TBCR: 20.0000 meq | EXTENDED_RELEASE_TABLET | Freq: Two times a day (BID) | ORAL | 3 refills | Status: DC

## 2021-02-03 MED ORDER — TORSEMIDE 20 MG PO TABS: 20.0000 mg | ORAL_TABLET | Freq: Two times a day (BID) | ORAL | 3 refills | Status: DC

## 2021-02-03 NOTE — Telephone Encounter (Signed)
Reviewed ED notes from last night. BNP was mildly elevated and CXR showed evidence of congestion. His shortness of breath is related to congestive heart failure (not Jardiance).  Current meds according to his chart:  Torsemide 30 mg once daily K+ 20 mEq in A and 10 mEq in P  PLAN: Low Na diet Increase Torsemide to 30 mg twice daily x 2 days After 2 days, reduce Torsemide to 20 mg twice daily  Increase K+ to 20 mEq twice daily  BMET 1 week. [name], PA-C    02/03/2021 8:49 AM

## 2021-02-03 NOTE — Telephone Encounter (Signed)
S/w pt is aware of recommendations.  Medication list updated. Orders placed in system for bmet and linked, appt made.

## 2021-02-03 NOTE — Telephone Encounter (Signed)
Pt c/o medication issue:  1. Name of Medication: empagliflozin (JARDIANCE) 10 MG TABS tablet  2. How are you currently taking this medication (dosage and times per day)? 1 tablet daily   3. Are you having a reaction (difficulty breathing--STAT)? no  4. What is your medication issue? Patient states he has been on the medication for 5 days and yesterday he had to call the ambulance for SOB. Patient was not audibly SOB on the phone. He states he was feeling fine until yesterday so he is not sure if it is the medication or if he needs to up his fluid pill. He states he is also a little tired.

## 2021-02-05 ENCOUNTER — Observation Stay (HOSPITAL_COMMUNITY)
Admission: EM | Admit: 2021-02-05 | Discharge: 2021-02-06 | Disposition: A | Discharge status: Home / Self Care | Payer: Medicare HMO | Attending: Emergency Medicine | Admitting: Emergency Medicine

## 2021-02-05 ENCOUNTER — Observation Stay (HOSPITAL_COMMUNITY): Payer: Medicare HMO

## 2021-02-05 ENCOUNTER — Emergency Department (HOSPITAL_COMMUNITY): Payer: Medicare HMO

## 2021-02-05 ENCOUNTER — Encounter (HOSPITAL_COMMUNITY): Payer: Self-pay

## 2021-02-05 ENCOUNTER — Other Ambulatory Visit: Payer: Self-pay

## 2021-02-05 DIAGNOSIS — E1122 Type 2 diabetes mellitus with diabetic chronic kidney disease: Secondary | ICD-10-CM | POA: Diagnosis not present

## 2021-02-05 DIAGNOSIS — Z87891 Personal history of nicotine dependence: Secondary | ICD-10-CM | POA: Insufficient documentation

## 2021-02-05 DIAGNOSIS — I2510 Atherosclerotic heart disease of native coronary artery without angina pectoris: Secondary | ICD-10-CM | POA: Diagnosis not present

## 2021-02-05 DIAGNOSIS — Z20822 Contact with and (suspected) exposure to covid-19: Secondary | ICD-10-CM | POA: Insufficient documentation

## 2021-02-05 DIAGNOSIS — I5032 Chronic diastolic (congestive) heart failure: Secondary | ICD-10-CM | POA: Diagnosis not present

## 2021-02-05 DIAGNOSIS — G6289 Other specified polyneuropathies: Secondary | ICD-10-CM | POA: Diagnosis not present

## 2021-02-05 DIAGNOSIS — R0902 Hypoxemia: Secondary | ICD-10-CM | POA: Diagnosis not present

## 2021-02-05 DIAGNOSIS — K219 Gastro-esophageal reflux disease without esophagitis: Secondary | ICD-10-CM | POA: Diagnosis present

## 2021-02-05 DIAGNOSIS — M5416 Radiculopathy, lumbar region: Secondary | ICD-10-CM

## 2021-02-05 DIAGNOSIS — E785 Hyperlipidemia, unspecified: Secondary | ICD-10-CM | POA: Diagnosis present

## 2021-02-05 DIAGNOSIS — E876 Hypokalemia: Secondary | ICD-10-CM | POA: Diagnosis not present

## 2021-02-05 DIAGNOSIS — N189 Chronic kidney disease, unspecified: Secondary | ICD-10-CM | POA: Diagnosis present

## 2021-02-05 DIAGNOSIS — R001 Bradycardia, unspecified: Secondary | ICD-10-CM | POA: Diagnosis not present

## 2021-02-05 DIAGNOSIS — G459 Transient cerebral ischemic attack, unspecified: Secondary | ICD-10-CM | POA: Diagnosis not present

## 2021-02-05 DIAGNOSIS — E119 Type 2 diabetes mellitus without complications: Secondary | ICD-10-CM

## 2021-02-05 DIAGNOSIS — Z79899 Other long term (current) drug therapy: Secondary | ICD-10-CM | POA: Diagnosis not present

## 2021-02-05 DIAGNOSIS — R42 Dizziness and giddiness: Secondary | ICD-10-CM

## 2021-02-05 DIAGNOSIS — G319 Degenerative disease of nervous system, unspecified: Secondary | ICD-10-CM | POA: Diagnosis not present

## 2021-02-05 DIAGNOSIS — Z794 Long term (current) use of insulin: Secondary | ICD-10-CM | POA: Insufficient documentation

## 2021-02-05 DIAGNOSIS — E1159 Type 2 diabetes mellitus with other circulatory complications: Secondary | ICD-10-CM

## 2021-02-05 DIAGNOSIS — N1831 Chronic kidney disease, stage 3a: Secondary | ICD-10-CM | POA: Insufficient documentation

## 2021-02-05 DIAGNOSIS — Z9861 Coronary angioplasty status: Secondary | ICD-10-CM

## 2021-02-05 DIAGNOSIS — I5040 Unspecified combined systolic (congestive) and diastolic (congestive) heart failure: Secondary | ICD-10-CM | POA: Insufficient documentation

## 2021-02-05 DIAGNOSIS — Z8673 Personal history of transient ischemic attack (TIA), and cerebral infarction without residual deficits: Secondary | ICD-10-CM | POA: Diagnosis present

## 2021-02-05 DIAGNOSIS — Y900 Blood alcohol level of less than 20 mg/100 ml: Secondary | ICD-10-CM | POA: Insufficient documentation

## 2021-02-05 DIAGNOSIS — R0602 Shortness of breath: Secondary | ICD-10-CM | POA: Diagnosis not present

## 2021-02-05 DIAGNOSIS — I130 Hypertensive heart and chronic kidney disease with heart failure and stage 1 through stage 4 chronic kidney disease, or unspecified chronic kidney disease: Secondary | ICD-10-CM | POA: Insufficient documentation

## 2021-02-05 DIAGNOSIS — E1165 Type 2 diabetes mellitus with hyperglycemia: Secondary | ICD-10-CM | POA: Diagnosis not present

## 2021-02-05 DIAGNOSIS — Z7901 Long term (current) use of anticoagulants: Secondary | ICD-10-CM | POA: Insufficient documentation

## 2021-02-05 DIAGNOSIS — N183 Chronic kidney disease, stage 3 unspecified: Secondary | ICD-10-CM | POA: Diagnosis present

## 2021-02-05 DIAGNOSIS — N1832 Chronic kidney disease, stage 3b: Secondary | ICD-10-CM | POA: Diagnosis present

## 2021-02-05 DIAGNOSIS — R000 Tachycardia, unspecified: Secondary | ICD-10-CM | POA: Diagnosis not present

## 2021-02-05 DIAGNOSIS — E7849 Other hyperlipidemia: Secondary | ICD-10-CM | POA: Diagnosis not present

## 2021-02-05 DIAGNOSIS — R531 Weakness: Secondary | ICD-10-CM | POA: Diagnosis not present

## 2021-02-05 DIAGNOSIS — I4510 Unspecified right bundle-branch block: Secondary | ICD-10-CM | POA: Diagnosis not present

## 2021-02-05 DIAGNOSIS — G629 Polyneuropathy, unspecified: Secondary | ICD-10-CM

## 2021-02-05 LAB — COMPREHENSIVE METABOLIC PANEL
ALT: 22 U/L (ref 0–44)
AST: 20 U/L (ref 15–41)
Albumin: 3.9 g/dL (ref 3.5–5.0)
Alkaline Phosphatase: 59 U/L (ref 38–126)
Anion gap: 10 (ref 5–15)
BUN: 22 mg/dL (ref 8–23)
CO2: 25 mmol/L (ref 22–32)
Calcium: 9.7 mg/dL (ref 8.9–10.3)
Chloride: 103 mmol/L (ref 98–111)
Creatinine, Ser: 1.61 mg/dL — ABNORMAL HIGH (ref 0.61–1.24)
GFR, Estimated: 48 mL/min — ABNORMAL LOW (ref >60)
Glucose, Bld: 137 mg/dL — ABNORMAL HIGH (ref 70–99)
Potassium: 3.7 mmol/L (ref 3.5–5.1)
Sodium: 138 mmol/L (ref 135–145)
Total Bilirubin: 0.9 mg/dL (ref 0.3–1.2)
Total Protein: 7.6 g/dL (ref 6.5–8.1)

## 2021-02-05 LAB — DIFFERENTIAL
Abs Immature Granulocytes: 0.02 10*3/uL (ref 0.00–0.07)
Basophils Absolute: 0 10*3/uL (ref 0.0–0.1)
Basophils Relative: 1 %
Eosinophils Absolute: 0.2 10*3/uL (ref 0.0–0.5)
Eosinophils Relative: 2 %
Immature Granulocytes: 0 %
Lymphocytes Relative: 48 %
Lymphs Abs: 3.4 10*3/uL (ref 0.7–4.0)
Monocytes Absolute: 0.7 10*3/uL (ref 0.1–1.0)
Monocytes Relative: 10 %
Neutro Abs: 2.8 10*3/uL (ref 1.7–7.7)
Neutrophils Relative %: 39 %

## 2021-02-05 LAB — RESP PANEL BY RT-PCR (FLU A&B, COVID) ARPGX2
Influenza A by PCR: NEGATIVE
Influenza B by PCR: NEGATIVE
SARS Coronavirus 2 by RT PCR: NEGATIVE

## 2021-02-05 LAB — URINALYSIS, ROUTINE W REFLEX MICROSCOPIC
Bacteria, UA: NONE SEEN
Bilirubin Urine: NEGATIVE
Glucose, UA: >=500 mg/dL — AB
Hgb urine dipstick: NEGATIVE
Ketones, ur: NEGATIVE mg/dL
Leukocytes,Ua: NEGATIVE
Nitrite: NEGATIVE
Protein, ur: NEGATIVE mg/dL
Specific Gravity, Urine: 1.018 (ref 1.005–1.030)
pH: 5 (ref 5.0–8.0)

## 2021-02-05 LAB — I-STAT CHEM 8, ED
BUN: 29 mg/dL — ABNORMAL HIGH (ref 8–23)
Calcium, Ion: 0.95 mmol/L — ABNORMAL LOW (ref 1.15–1.40)
Chloride: 108 mmol/L (ref 98–111)
Creatinine, Ser: 1.5 mg/dL — ABNORMAL HIGH (ref 0.61–1.24)
Glucose, Bld: 136 mg/dL — ABNORMAL HIGH (ref 70–99)
HCT: 53 % — ABNORMAL HIGH (ref 39.0–52.0)
Hemoglobin: 18 g/dL — ABNORMAL HIGH (ref 13.0–17.0)
Potassium: 4.1 mmol/L (ref 3.5–5.1)
Sodium: 139 mmol/L (ref 135–145)
TCO2: 27 mmol/L (ref 22–32)

## 2021-02-05 LAB — RAPID URINE DRUG SCREEN, HOSP PERFORMED
Amphetamines: NOT DETECTED
Barbiturates: NOT DETECTED
Benzodiazepines: NOT DETECTED
Cocaine: NOT DETECTED
Opiates: NOT DETECTED
Tetrahydrocannabinol: NOT DETECTED

## 2021-02-05 LAB — ETHANOL: Alcohol, Ethyl (B): <10 mg/dL (ref <10)

## 2021-02-05 LAB — TROPONIN I (HIGH SENSITIVITY)
Troponin I (High Sensitivity): 40 ng/L — ABNORMAL HIGH (ref <18)
Troponin I (High Sensitivity): 37 ng/L — ABNORMAL HIGH (ref <18)

## 2021-02-05 LAB — CBC
HCT: 52.1 % — ABNORMAL HIGH (ref 39.0–52.0)
Hemoglobin: 16.7 g/dL (ref 13.0–17.0)
MCH: 29.2 pg (ref 26.0–34.0)
MCHC: 32.1 g/dL (ref 30.0–36.0)
MCV: 91.1 fL (ref 80.0–100.0)
Platelets: 244 10*3/uL (ref 150–400)
RBC: 5.72 MIL/uL (ref 4.22–5.81)
RDW: 14.5 % (ref 11.5–15.5)
WBC: 7.1 10*3/uL (ref 4.0–10.5)
nRBC: 0 % (ref 0.0–0.2)

## 2021-02-05 LAB — PROTIME-INR
INR: 1 (ref 0.8–1.2)
Prothrombin Time: 12.7 seconds (ref 11.4–15.2)

## 2021-02-05 LAB — HIV ANTIBODY (ROUTINE TESTING W REFLEX): HIV Screen 4th Generation wRfx: NONREACTIVE

## 2021-02-05 LAB — APTT: aPTT: 30 seconds (ref 24–36)

## 2021-02-05 MED ORDER — STROKE: EARLY STAGES OF RECOVERY BOOK
Freq: Once | Status: AC
Start: 2021-02-05 — End: 2021-02-06

## 2021-02-05 MED ORDER — ASPIRIN 81 MG PO CHEW
81.0000 mg | CHEWABLE_TABLET | Freq: Every day | ORAL | Status: DC
  Administered 2021-02-06: 81 mg via ORAL
  Filled 2021-02-05: qty 1

## 2021-02-05 MED ORDER — SENNOSIDES-DOCUSATE SODIUM 8.6-50 MG PO TABS: 1.0000 | ORAL_TABLET | Freq: Every evening | ORAL | Status: DC | PRN

## 2021-02-05 MED ORDER — CARVEDILOL 12.5 MG PO TABS
25.0000 mg | ORAL_TABLET | Freq: Two times a day (BID) | ORAL | Status: DC
  Administered 2021-02-06 (×2): 25 mg via ORAL
  Filled 2021-02-05: qty 2
  Filled 2021-02-05: qty 8

## 2021-02-05 MED ORDER — ROSUVASTATIN CALCIUM 20 MG PO TABS
40.0000 mg | ORAL_TABLET | Freq: Every day | ORAL | Status: DC
  Administered 2021-02-06: 40 mg via ORAL
  Filled 2021-02-05: qty 2

## 2021-02-05 MED ORDER — ACETAMINOPHEN 325 MG PO TABS: 650.0000 mg | ORAL_TABLET | ORAL | Status: DC | PRN

## 2021-02-05 MED ORDER — VITAMIN B-12 1000 MCG PO TABS
1000.0000 ug | ORAL_TABLET | Freq: Every day | ORAL | Status: DC
  Administered 2021-02-06: 1000 ug via ORAL
  Filled 2021-02-05: qty 1

## 2021-02-05 MED ORDER — SODIUM CHLORIDE 0.9 % IV SOLN: INTRAVENOUS | Status: AC

## 2021-02-05 MED ORDER — ENOXAPARIN SODIUM 80 MG/0.8ML IJ SOSY
70.0000 mg | PREFILLED_SYRINGE | INTRAMUSCULAR | Status: DC
  Administered 2021-02-05: 70 mg via SUBCUTANEOUS
  Filled 2021-02-05: qty 0.7

## 2021-02-05 MED ORDER — DOCUSATE SODIUM 50 MG PO CAPS
50.0000 mg | ORAL_CAPSULE | Freq: Every day | ORAL | Status: DC
  Administered 2021-02-05: 50 mg via ORAL
  Filled 2021-02-05 (×2): qty 1

## 2021-02-05 MED ORDER — EMPAGLIFLOZIN 10 MG PO TABS
10.0000 mg | ORAL_TABLET | Freq: Every day | ORAL | Status: DC
  Administered 2021-02-06: 10 mg via ORAL
  Filled 2021-02-05 (×3): qty 1

## 2021-02-05 MED ORDER — SPIRONOLACTONE 12.5 MG HALF TABLET
12.5000 mg | ORAL_TABLET | Freq: Every day | ORAL | Status: DC
  Filled 2021-02-05: qty 1

## 2021-02-05 MED ORDER — ACETAMINOPHEN 650 MG RE SUPP: 650.0000 mg | RECTAL | Status: DC | PRN

## 2021-02-05 MED ORDER — TORSEMIDE 20 MG PO TABS
20.0000 mg | ORAL_TABLET | Freq: Two times a day (BID) | ORAL | Status: DC
  Administered 2021-02-06: 20 mg via ORAL
  Filled 2021-02-05: qty 1

## 2021-02-05 MED ORDER — PAROXETINE HCL 20 MG PO TABS
20.0000 mg | ORAL_TABLET | Freq: Every day | ORAL | Status: DC
  Administered 2021-02-06: 20 mg via ORAL
  Filled 2021-02-05 (×2): qty 1

## 2021-02-05 MED ORDER — ACETAMINOPHEN 160 MG/5ML PO SOLN: 650.0000 mg | ORAL | Status: DC | PRN

## 2021-02-05 MED ORDER — CLOPIDOGREL BISULFATE 75 MG PO TABS
75.0000 mg | ORAL_TABLET | Freq: Every day | ORAL | Status: DC
  Administered 2021-02-06: 75 mg via ORAL
  Filled 2021-02-05 (×2): qty 1

## 2021-02-05 MED ORDER — NITROGLYCERIN 0.4 MG SL SUBL: 0.4000 mg | SUBLINGUAL_TABLET | SUBLINGUAL | Status: DC | PRN

## 2021-02-05 MED ORDER — WITCH HAZEL-GLYCERIN EX PADS
MEDICATED_PAD | CUTANEOUS | Status: DC | PRN
  Administered 2021-02-06: 1 via TOPICAL
  Filled 2021-02-05 (×2): qty 100

## 2021-02-05 MED ORDER — ENOXAPARIN SODIUM 40 MG/0.4ML IJ SOSY: 40.0000 mg | PREFILLED_SYRINGE | INTRAMUSCULAR | Status: DC

## 2021-02-05 MED ORDER — IOHEXOL 350 MG/ML SOLN
50.0000 mL | Freq: Once | INTRAVENOUS | Status: AC | PRN
  Administered 2021-02-05: 50 mL via INTRAVENOUS

## 2021-02-05 MED ORDER — INSULIN ASPART 100 UNIT/ML IJ SOLN
0.0000 [IU] | Freq: Three times a day (TID) | INTRAMUSCULAR | Status: DC
  Administered 2021-02-06: 3 [IU] via SUBCUTANEOUS
  Administered 2021-02-06: 2 [IU] via SUBCUTANEOUS

## 2021-02-05 MED ORDER — PANTOPRAZOLE SODIUM 40 MG PO TBEC
40.0000 mg | DELAYED_RELEASE_TABLET | Freq: Every day | ORAL | Status: DC
  Administered 2021-02-06: 40 mg via ORAL
  Filled 2021-02-05: qty 1

## 2021-02-05 MED ORDER — ISOSORBIDE MONONITRATE ER 60 MG PO TB24
90.0000 mg | ORAL_TABLET | Freq: Every day | ORAL | Status: DC
  Administered 2021-02-06: 90 mg via ORAL
  Filled 2021-02-05: qty 3

## 2021-02-05 MED ORDER — TRAMADOL HCL 50 MG PO TABS: 50.0000 mg | ORAL_TABLET | Freq: Four times a day (QID) | ORAL | Status: DC | PRN

## 2021-02-05 MED ORDER — GABAPENTIN 300 MG PO CAPS
300.0000 mg | ORAL_CAPSULE | Freq: Three times a day (TID) | ORAL | Status: DC
  Administered 2021-02-05 – 2021-02-06 (×3): 300 mg via ORAL
  Filled 2021-02-05 (×3): qty 1

## 2021-02-05 NOTE — ED Notes (Signed)
Pt transported to MRI 

## 2021-02-05 NOTE — ED Provider Notes (Signed)
Emergency Medicine Provider Triage Evaluation Note  Ario Dirks , a 65 y.o. male  was evaluated in triage.  Pt complains of dizziness.  Fell while going to bed last night, last known well 9 PM.  When he woke up this morning at 5 AM he noticed that he felt dizzy, he describes a woozy sensation associated with generalized weakness.  He denies similar in the past.  Reports symptoms have gradually improved throughout the day.  Patient reports that his dizziness earlier was associated with difficulty with word finding and speaking which have resolved.  Of note patient reports he was seen in this ER for shortness of breath 3 days ago and had his diuretics increased.  Review of Systems  Positive: Dizziness, generalized weakness Negative: Fever, chills, headache, neck stiffness, chest pain/shortness of breath, abdominal pain, vomiting, diarrhea  Physical Exam  BP (!) 162/80 (BP Location: Right Arm)   Pulse 66   Temp 98.9 F (37.2 C) (Oral)   Resp 20   SpO2 99%  Gen:   Awake, no distress   Resp:  Normal effort  MSK:   Moves extremities without difficulty  Other:  Speech is clear and goal oriented, follows commands Major Cranial nerves without deficit, no facial droop Normal strength in upper and lower extremities bilaterally including dorsiflexion and plantar flexion, strong and equal grip strength Sensation normal to light and sharp touch Moves extremities without ataxia, coordination intact Normal finger to nose and rapid alternating movements Neg romberg, no pronator drift Normal gait Normal heel-shin and balance   Medical Decision Making  Medically screening exam initiated at 10:24 AM.  Appropriate orders placed.  Zackory Czarnowski was informed that the remainder of the evaluation will be completed by another provider, this initial triage assessment does not replace that evaluation, and the importance of remaining in the ED until their evaluation is complete.  65 year old male presented  for dizziness which is improving.  Also reports some difficulty with word finding and speech earlier today which has also improved.  Last known well was 9 PM last night.  Does not meet code stroke criteria, VAN negative.  Will order CT head as well as CVA labs.  Note: Portions of this report may have been transcribed using voice recognition software. Every effort was made to ensure accuracy; however, inadvertent computerized transcription errors may still be present.    Deliah Boston, PA-C 02/05/21 1027    Lorelle Gibbs, DO 02/06/21 1548

## 2021-02-05 NOTE — ED Notes (Signed)
Called pt x3 for MSE form to be signed, no response.

## 2021-02-05 NOTE — ED Notes (Signed)
Pt returned from MRI. Pt given [place] sandwich and water per RN.

## 2021-02-05 NOTE — Consult Note (Signed)
NEURO HOSPITALIST CONSULT NOTE   Requestig physician: Dr. Billy Fischer  Reason for Consult: Transient aphasia in conjunction with dizziness  History obtained from:   Patient and Chart     HPI:                                                                                                                                          [name] [name] is an 65 y.o. male with a PMHx of anxiety, depression, B12 deficiency, CAD status post stenting, CHF, CKD 3, DM, HTN, prostatitis, hemorrhoids, GERD, HLD, lumbar spinal stenosis, obesity, OSA, tobacco abuse and neuropathy who presented to the ED on Sunday morning with complaints of generalized weakness, dizziness with ambulation, unsteady gait and garbled speech. LKN was 9 PM on Saturday, after which he fell while going to bed. He woke up at 5 AM on Sunday and felt dizzy; he describes this as a woozy sensation along with generalized weakness. He felt as though he was listing to the left when ambulating. When he called someone on the telephone, he experienced word-finding difficulty but was able to understand what was said to him. His symptoms gradually improved during the day.   In the ED, his Cr was elevated at 1.61, somewhat higher than his baseline of 1.4. Covid negative. UDS negative. MRI showed no acute abnormality, but did reveal a small chronic left cerebellar ischemic infarction. The patient was surprised by the MRI result, stating that prior to the MRI, he was not aware of having had a stroke in the past.    Past Medical History:  Diagnosis Date   CAD (coronary artery disease)    a. s/p BMS pRCA 2002; b. DES to [place] p/m RCA 2006; c. PCI/DES OM4 2007; d. PCI/CBA to RCA for ISR 10/2006; e.08/2012 STEMI/Cath/PCI:  LAD 95% >> PCI: Promus Prem DES  //  f. NSTEMI 10/15 >> LHC: mLAD stent ok, dLAD 70, OM1 CTO, OM2 stent ok, dOM2 90, OM3 30-40, dLCx 90, p-mRCA stent ok, mRCA stent ok w/ 60-70 ISR, EF 50% >> med Rx // MV 1/17: no ischemia  // Cath ([facility]) 05/2019: RCA 100 (CTO)    Combined systolic and diastolic CHF    a. Myoview 1/17: EF 35%  //  b. Echo 1/17 EF 45-50% // Echo 05/2019: EF 50-55 // Echocardiogram 8/21: EF 50, inf AK, post and mid inf HK, mod LVH, RVSP 25.3, mild to mod LAE, trivial MR    Depression    Diabetes mellitus ([place])    a. A1c 8.8 08/2012->Metformin initiated. => b. A1c (9/14): 6.6   Gastroesophageal reflux disease    History of nuclear stress test    a. Myoview 1/17: EF 35%, fixed inferior lateral defect consistent with infarct, no ischemia, intermediate risk  History of pneumonia    HTN (hypertension)    Hx of NSTEMI    Hyperlipidemia    Ischemic cardiomyopathy    a. EF 40%; improved to normal;  b. 08/2012 Echo: EF 50-55%, mod LVH.//  c. Echo 9/16: Inf HK, mild LVH, EF 55%, mild LAE, normal RVF, mild RAE, PASP 35 mmHg  //  d. Echo 1/17: EF 45-50%, inferior HK, mild BAE, PASP 33 mmHg   Obesity    OSA (obstructive sleep apnea)    Does not use CPAP as of 05/2011   Tobacco abuse     Past Surgical History:  Procedure Laterality Date   CORONARY ANGIOPLASTY WITH STENT PLACEMENT     CORONARY STENT PLACEMENT  2009   LEFT HEART CATHETERIZATION WITH CORONARY ANGIOGRAM N/A 08/31/2012   Procedure: LEFT HEART CATHETERIZATION WITH CORONARY ANGIOGRAM;  Surgeon: Burnell Blanks, MD;  Location: [facility] CATH LAB;  Service: Cardiovascular;  Laterality: N/A;   LEFT HEART CATHETERIZATION WITH CORONARY ANGIOGRAM N/A 11/16/2013   Procedure: LEFT HEART CATHETERIZATION WITH CORONARY ANGIOGRAM;  Surgeon: Blane Ohara, MD;  Location: [facility] CATH LAB;  Service: Cardiovascular;  Laterality: N/A;   LEFT HEART CATHETERIZATION WITH CORONARY ANGIOGRAM N/A 03/15/2014   Procedure: LEFT HEART CATHETERIZATION WITH CORONARY ANGIOGRAM;  Surgeon: Peter M Martinique, MD;  Location: [facility] CATH LAB;  Service: Cardiovascular;  Laterality: N/A;   PERCUTANEOUS CORONARY STENT INTERVENTION (PCI-S) Right 08/31/2012   Procedure: PERCUTANEOUS CORONARY STENT  INTERVENTION (PCI-S);  Surgeon: Burnell Blanks, MD;  Location: [facility] CATH LAB;  Service: Cardiovascular;  Laterality: Right;   PERCUTANEOUS CORONARY STENT INTERVENTION (PCI-S)  11/16/2013   Procedure: PERCUTANEOUS CORONARY STENT INTERVENTION (PCI-S);  Surgeon: Blane Ohara, MD;  Location: [facility] CATH LAB;  Service: Cardiovascular;;    Family History  Problem Relation Age of Onset   Depression Mother    Cancer Mother        ovarian   Hypertension Mother        Died, 82   Other Father        motor vehicle accident   Coronary artery disease Other    Heart attack Neg Hx    Stroke Neg Hx           Social History:  reports that he has quit smoking. His smoking use included cigarettes. He has a 15.00 pack-year smoking history. He has never used smokeless tobacco. He reports that he does not drink alcohol and does not use drugs.  Allergies  Allergen Reactions   Penicillin G Other (See Comments)    Did it involve swelling of the face/tongue/throat, SOB, or low BP?  N/A Did it involve sudden or severe rash/hives, skin peeling, or any reaction on the inside of your mouth or nose? N/A Did you need to seek medical attention at a hospital or doctor's office? N/A When did it last happen? Child If all above answers are "NO", may proceed with cephalosporin use.   Penicillins Other (See Comments)    Did it involve swelling of the face/tongue/throat, SOB, or low BP? N/A Did it involve sudden or severe rash/hives, skin peeling, or any reaction on the inside of your mouth or nose?N/A Did you need to seek medical attention at a hospital or doctor's office? N/A When did it last happen? Child      If all above answers are "NO", may proceed with cephalosporin use.    MEDICATIONS:  No current facility-administered medications on file prior to encounter.   Current Outpatient Medications  on File Prior to Encounter  Medication Sig Dispense Refill   Alcohol Swabs (B-D SINGLE USE SWABS BUTTERFLY) PADS Use up to 4 times a day to test blood sugars. Dx: E10.9, E11.9 100 each 11   aspirin 81 MG tablet Take 81 mg by mouth daily.     Blood Glucose Calibration (TRUE METRIX LEVEL 1) Low SOLN Use as needed. Dx: E10.9, E11.9 1 each 0   blood glucose meter kit and supplies Dispense based on patient and insurance preference. Use up to four times daily as directed. (FOR ICD-10 E10.9, E11.9). 1 each 0   carvedilol (COREG) 25 MG tablet Take 1 tablet (25 mg total) by mouth 2 (two) times daily with a meal. 180 tablet 3   clopidogrel (PLAVIX) 75 MG tablet Take 1 tablet (75 mg total) by mouth daily. 90 tablet 3   empagliflozin (JARDIANCE) 10 MG TABS tablet Take 1 tablet (10 mg total) by mouth daily before breakfast. 30 tablet 11   gabapentin (NEURONTIN) 300 MG capsule TAKE 1 CAPSULE(300 MG) BY MOUTH THREE TIMES DAILY 270 capsule 1   glucose blood (TRUE METRIX BLOOD GLUCOSE TEST) test strip Use to test blood sugars up to 4 times a day. DX: E10.9, E.11.9 100 each 12   hydrocortisone cream 1 % Apply to affected area 2 times daily 15 g 0   isosorbide mononitrate (IMDUR) 30 MG 24 hr tablet TAKE 3 TABLETS(90 MG) BY MOUTH DAILY 270 tablet 1   Lancets 33G MISC Use to test blood sugars up to 4 times a day. DX: E10.9, E11.9 100 each 6   Multiple Vitamins-Minerals (VITAMIN D3 COMPLETE PO) Take 1 tablet by mouth daily.     nitroGLYCERIN (NITROSTAT) 0.4 MG SL tablet PLACE 1 TABLET UNDER THE TONGUE EVERY 5 MINUTES AS NEEDED FOR CHEST PAIN 25 tablet 5   pantoprazole (PROTONIX) 40 MG tablet Take 1 tablet (40 mg total) by mouth daily. 90 tablet 3   PARoxetine (PAXIL) 20 MG tablet Take 1 tablet (20 mg total) by mouth daily. 90 tablet 3   potassium chloride (KLOR-CON) 10 MEQ tablet Take 2 tablets (20 mEq total) by mouth 2 (two) times daily. 360 tablet 3   rosuvastatin (CRESTOR) 40 MG tablet TAKE 1 TABLET(40 MG) BY MOUTH  DAILY 90 tablet 3   spironolactone (ALDACTONE) 25 MG tablet Take 0.5 tablets (12.5 mg total) by mouth daily. 45 tablet 3   torsemide (DEMADEX) 20 MG tablet Take 1 tablet (20 mg total) by mouth 2 (two) times daily. 270 tablet 3   traMADol (ULTRAM) 50 MG tablet TAKE 1 TABLET(50 MG) BY MOUTH EVERY 6 HOURS AS NEEDED 120 tablet 0   vitamin B-12 (CYANOCOBALAMIN) 1000 MCG tablet Take 1 tablet (1,000 mcg total) by mouth daily. 90 tablet 3     Scheduled:   stroke: mapping our early stages of recovery book   Does not apply Once   [START ON 02/06/2021] aspirin  81 mg Oral Daily   enoxaparin (LOVENOX) injection  70 mg Subcutaneous Q24H   [START ON 02/06/2021] pantoprazole  40 mg Oral Daily   [START ON 02/06/2021] rosuvastatin  40 mg Oral Daily   Continuous:  sodium chloride       ROS:  Denies abdominal pain, SOB or CP. Had a cough yesterday. No seizure activity. No facial weakness. No arm or leg weakness. No ataxia. No fevers or chills. Other ROS as per HPI.    Blood pressure (!) 130/106, pulse 81, temperature 98.9 F (37.2 C), temperature source Oral, resp. rate 18, SpO2 100 %.   General Examination:                                                                                                       Physical Exam  HEENT-  [place]/AT  Lungs- Respirations unlabored Extremities- No edema  Neurological Examination Mental Status: Awake and alert. Fully oriented. Speech fluent without evidence of aphasia. No word finding difficulty noted. Comprehension intact; able to follow all commands without difficulty. No dysarthria.  Cranial Nerves: II: Temporal visual fields intact with no extinction to DSS. PERRL.   III,IV, VI: EOMI. No diplopia. No nystagmus. No ptosis.  V,VII: Smile symmetric, facial temp sensation equal bilaterally VIII: Hearing intact to conversation IX,X:  No hypophonia XI: Symmetric XII: Midline tongue extension Motor: RUE 5/5 LUE 5/5 RLE 5/5 LLE 4/5 hip flexion, otherwise 5/5 No pronator drift Sensory: Temp and light touch intact throughout, bilaterally. No extinction to DSS.  Deep Tendon Reflexes: 2+ and symmetric bilateral brachioradialis, biceps and patellae Cerebellar: No ataxia with FNF bilaterally.  Gait: Deferred   Lab Results: Basic Metabolic Panel: Recent Labs  Lab 02/02/21 0827 02/05/21 1023 02/05/21 1059  NA 139 138 139  K 4.0 3.7 4.1  CL 105 103 108  CO2 24 25  --   GLUCOSE 154* 137* 136*  BUN 24* 22 29*  CREATININE 1.35* 1.61* 1.50*  CALCIUM 9.4 9.7  --     CBC: Recent Labs  Lab 02/02/21 0827 02/05/21 1023 02/05/21 1059  WBC 7.6 7.1  --   NEUTROABS 4.3 2.8  --   HGB 15.9 16.7 18.0*  HCT 48.3 52.1* 53.0*  MCV 89.8 91.1  --   PLT 229 244  --     Cardiac Enzymes: No results for input(s): CKTOTAL, CKMB, CKMBINDEX, TROPONINI in the last 168 hours.  Lipid Panel: No results for input(s): CHOL, TRIG, HDL, CHOLHDL, VLDL, LDLCALC in the last 168 hours.  Imaging: DG Chest 1 View  Result Date: 02/05/2021 CLINICAL DATA:  Dizziness, shortness of breath EXAM: CHEST  1 VIEW COMPARISON:  Prior chest x-ray 02/02/2021 FINDINGS: Stable cardiac and mediastinal contours. Trace linear atelectasis versus scarring in the right middle lobe. Mild vascular congestion without edema. No focal airspace infiltrate, pleural effusion or pneumothorax. Stable mild bronchitic changes. No lytic or blastic osseous lesion. IMPRESSION: No active disease. Electronically Signed   By: Jacqulynn Cadet M.D.   On: 02/05/2021 11:33   CT HEAD WO CONTRAST  Result Date: 02/05/2021 CLINICAL DATA:  Dizziness with ambulation, generalized weakness. EXAM: CT HEAD WITHOUT CONTRAST TECHNIQUE: Contiguous axial images were obtained from the base of the skull through the vertex without intravenous contrast. COMPARISON:  Head CT dated 09/15/2011.  FINDINGS: Brain: Ventricles are within normal limits in size and configuration. Mild chronic small  vessel ischemic changes within the bilateral periventricular and subcortical white matter regions. No mass, hemorrhage, edema or other evidence of acute parenchymal abnormality. No extra-axial hemorrhage. Vascular: Chronic calcified atherosclerotic changes of the large vessels at the skull base. No unexpected hyperdense vessel. Skull: Normal. Negative for fracture or focal lesion. Sinuses/Orbits: No acute finding. Other: None. IMPRESSION: 1. No acute findings. No intracranial mass, hemorrhage or edema. 2. Mild chronic small vessel ischemic changes in the white matter. Electronically Signed   By: Franki Cabot M.D.   On: 02/05/2021 11:15   MR BRAIN WO CONTRAST  Result Date: 02/05/2021 CLINICAL DATA:  Vertigo, central. Dizziness. Fall last night. Generalized weakness. Resolved speech disturbance. EXAM: MRI HEAD WITHOUT CONTRAST TECHNIQUE: Multiplanar, multiecho pulse sequences of the brain and surrounding structures were obtained without intravenous contrast. COMPARISON:  Head CT 02/05/2021 FINDINGS: Brain: There is no evidence of an acute infarct, intracranial hemorrhage, mass, midline shift, or extra-axial fluid collection. T2 hyperintensities in the cerebral white matter bilaterally are nonspecific but compatible with moderately age advanced chronic small vessel ischemic disease. There is a small chronic left cerebellar infarct. Mild cerebral atrophy is within normal limits for age. Vascular: Major intracranial vascular flow voids are preserved. Skull and upper cervical spine: Unremarkable bone marrow signal. Sinuses/Orbits: Unremarkable orbits. Mild mucosal thickening in the ethmoid and maxillary sinuses. Clear mastoid air cells. Other: None. IMPRESSION: 1. No acute intracranial abnormality. 2. Moderate chronic small vessel ischemic disease. 3. Small chronic left cerebellar infarct. Electronically Signed   By:  Logan Bores M.D.   On: 02/05/2021 18:33     Assessment: 65 year old male presenting with transient speech deficit in conjunction with dizziness, listing to the left while ambulating and diffuse weakness.  1. Exam is essentially unremarkable.  2. MRI brain is negative for acute abnormality. Moderate chronic small vessel ischemic changes and a small chronic left cerebellar infarction are noted.  3. His presentation is suggestive of TIA. Peripheral vestibulopathy is possible explanation for the dizziness and gait unsteadiness, but would not explain his dysphasia.  4. Stroke risk factors: CAD, CHF, CKD, DM, HTN, HLD, obesity, OSA, tobacco abuse and evidence for prior stroke on MRI.  5. Poorly controlled DM. HgA1C is 10.6 6. Lipid panel unremarkable except for low HDL  Recommendations: 1. CTA of head and neck.  2. TTE.  3. Cardiac telemetry 4. PT/OT/Speech 5. Continue DAPT (ASA and Plavix) 6. Continue his statin 7. Permissive HTN x 24 hours.  8. Management of his CKD with possible component of AKI.  9. Glycemic control.  10. Smoking cessation counseling.   Addendum:  CTA of head and neck: Normal.    Electronically signed: Dr. Kerney Elbe 02/05/2021, 7:31 PM

## 2021-02-05 NOTE — ED Provider Notes (Signed)
[facility] EMERGENCY DEPARTMENT Provider Note   CSN: [id number] Arrival date & time: 02/05/21  6063     History No chief complaint on file.   Salah Nakamura is a 65 y.o. male.  HPI     65yo male with history of CAD, combined systolic and diastolic CHF, DM, htn, hlpd, OSA, presents with concern for episode of dizziness and word finding difficulties.  LNW at 9PM.  Woke at 5AM feeling off balance, dizziness, room spinning.  Had difficulty ambulating and felt he was going to the left.  Not like any dizziness he has had before. Diuretics increased days ago but not experiencing any lightheadedness.  With the room spinning, off balance sensation he also noted word finding difficulties.  These lasted for about 2.5 hr, maybe a little less. Improved prior to transport to ED. Now feels slightly off but not having any word finding difficulities or significant dizziness. No chest pain or dyspnea. Denies numbness, focal weakness, visual changes or facial droop.      Past Medical History:  Diagnosis Date   CAD (coronary artery disease)    a. s/p BMS pRCA 2002; b. DES to pRCA & DES p/m RCA 2006; c. PCI/DES OM4 2007; d. PCI/CBA to RCA for ISR 10/2006; e.08/2012 STEMI/Cath/PCI:  LAD 95% >> PCI: Promus Prem DES  //  f. NSTEMI 10/15 >> LHC: mLAD stent ok, dLAD 70, OM1 CTO, OM2 stent ok, dOM2 90, OM3 30-40, dLCx 90, p-mRCA stent ok, mRCA stent ok w/ 60-70 ISR, EF 50% >> med Rx // MV 1/17: no ischemia // Cath ([facility]) 05/2019: RCA 100 (CTO)    Combined systolic and diastolic CHF    a. Myoview 1/17: EF 35%  //  b. Echo 1/17 EF 45-50% // Echo 05/2019: EF 50-55 // Echocardiogram 8/21: EF 50, inf AK, post and mid inf HK, mod LVH, RVSP 25.3, mild to mod LAE, trivial MR    Depression    Diabetes mellitus ([place])    a. A1c 8.8 08/2012->Metformin initiated. => b. A1c (9/14): 6.6   Gastroesophageal reflux disease    History of nuclear stress test    a. Myoview 1/17: EF 35%, fixed inferior lateral  defect consistent with infarct, no ischemia, intermediate risk   History of pneumonia    HTN (hypertension)    Hx of NSTEMI    Hyperlipidemia    Ischemic cardiomyopathy    a. EF 40%; improved to normal;  b. 08/2012 Echo: EF 50-55%, mod LVH.//  c. Echo 9/16: Inf HK, mild LVH, EF 55%, mild LAE, normal RVF, mild RAE, PASP 35 mmHg  //  d. Echo 1/17: EF 45-50%, inferior HK, mild BAE, PASP 33 mmHg   Obesity    OSA (obstructive sleep apnea)    Does not use CPAP as of 05/2011   Tobacco abuse     Patient Active Problem List   Diagnosis Date Noted   TIA (transient ischemic attack) 02/05/2021   Aortic atherosclerosis ([place]) 12/26/2020   Cough 12/26/2020   Vitamin D deficiency 04/03/2020   B12 deficiency 04/03/2020   Itching 11/22/2019   Chronic heart failure with preserved ejection fraction (HFpEF) ([place]) 05/13/2019   Chronic coronary artery disease 05/13/2019   Elevated troponin 05/13/2019   Morbid obesity with BMI of 40.0-44.9, adult ([place]) 05/13/2019   Pre-ulcerative calluses 02/03/2019   Foot injury, left, initial encounter 11/26/2018   Chest pain 05/30/2017   Hypokalemia 05/30/2017   Allergic rhinitis 12/18/2016   Lumbar radiculitis 09/05/2016   Lumbar  spinal stenosis 09/05/2016   CKD (chronic kidney disease), stage III ([place]) 12/29/2015   Chest pain syndrome 12/15/2015   Acute kidney injury ([place]) 12/15/2015   Atypical chest pain 12/15/2015   Encounter for well adult exam with abnormal findings 06/27/2015   Chronic low back pain 06/27/2015   Left lumbar radiculopathy 04/13/2015   Bilateral plantar fasciitis 04/13/2015   Essential hypertension 03/09/2015   OSA (obstructive sleep apnea) 03/27/2014   Peripheral neuropathy ([place]) 12/29/2013   External hemorrhoid, thrombosed 12/03/2013   Sinus bradycardia 11/15/2013   NSTEMI (non-ST elevated myocardial infarction) ([place]) 11/14/2013   Chronic combined systolic and diastolic CHF  93/79/0240   CAD S/P multiple PCI    Hyperlipidemia    DM2  (diabetes mellitus, type 2) ([place]) 06/26/2013   Gait disorder 06/26/2013   Obesity, Class III, BMI 40-49.9 (morbid obesity) ([place]) 09/10/2012   Tobacco abuse 09/02/2012   Ischemic cardiomyopathy- EF 50-55% June 2015    ST elevation myocardial infarction (STEMI) of anterior wall, initial episode of care ([place]) 09/01/2012   HEMORRHOIDS 12/26/2009   Acute prostatitis 12/26/2009   GANGLION CYST, WRIST, RIGHT 11/28/2009   COLONIC POLYPS 10/07/2009   Anxiety with depression 07/21/2009   DYSPNEA 03/24/2009   GASTROESOPHAGEAL REFLUX DISEASE 01/27/2007   PERCUTANEOUS TRANSLUMINAL CORONARY ANGIOPLASTY, HX OF 02/09/2006    Past Surgical History:  Procedure Laterality Date   CORONARY ANGIOPLASTY WITH STENT PLACEMENT     CORONARY STENT PLACEMENT  2009   LEFT HEART CATHETERIZATION WITH CORONARY ANGIOGRAM N/A 08/31/2012   Procedure: LEFT HEART CATHETERIZATION WITH CORONARY ANGIOGRAM;  Surgeon: Burnell Blanks, MD;  Location: [facility] CATH LAB;  Service: Cardiovascular;  Laterality: N/A;   LEFT HEART CATHETERIZATION WITH CORONARY ANGIOGRAM N/A 11/16/2013   Procedure: LEFT HEART CATHETERIZATION WITH CORONARY ANGIOGRAM;  Surgeon: Blane Ohara, MD;  Location: [facility] CATH LAB;  Service: Cardiovascular;  Laterality: N/A;   LEFT HEART CATHETERIZATION WITH CORONARY ANGIOGRAM N/A 03/15/2014   Procedure: LEFT HEART CATHETERIZATION WITH CORONARY ANGIOGRAM;  Surgeon: Peter M Martinique, MD;  Location: [facility] CATH LAB;  Service: Cardiovascular;  Laterality: N/A;   PERCUTANEOUS CORONARY STENT INTERVENTION (PCI-S) Right 08/31/2012   Procedure: PERCUTANEOUS CORONARY STENT INTERVENTION (PCI-S);  Surgeon: Burnell Blanks, MD;  Location: [facility] CATH LAB;  Service: Cardiovascular;  Laterality: Right;   PERCUTANEOUS CORONARY STENT INTERVENTION (PCI-S)  11/16/2013   Procedure: PERCUTANEOUS CORONARY STENT INTERVENTION (PCI-S);  Surgeon: Blane Ohara, MD;  Location: [facility] CATH LAB;  Service: Cardiovascular;;       Family History   Problem Relation Age of Onset   Depression Mother    Cancer Mother        ovarian   Hypertension Mother        Died, 29   Other Father        motor vehicle accident   Coronary artery disease Other    Heart attack Neg Hx    Stroke Neg Hx     Social History   Tobacco Use   Smoking status: Former    Packs/day: 0.50    Years: 30.00    Pack years: 15.00    Types: Cigarettes   Smokeless tobacco: Never   Tobacco comments:    trying to cut down  Vaping Use   Vaping Use: Never used  Substance Use Topics   Alcohol use: No   Drug use: No    Comment: remote marijuana use    Home Medications Prior to Admission medications   Medication Sig Start Date End Date Taking?  Authorizing Provider  Alcohol Swabs (B-D SINGLE USE SWABS BUTTERFLY) PADS Use up to 4 times a day to test blood sugars. Dx: E10.9, E11.9 09/30/20   Biagio Borg, MD  aspirin 81 MG tablet Take 81 mg by mouth daily.    [provider]  Blood Glucose Calibration (TRUE METRIX LEVEL 1) Low SOLN Use as needed. Dx: E10.9, E11.9 01/12/21   Biagio Borg, MD  blood glucose meter kit and supplies Dispense based on patient and insurance preference. Use up to four times daily as directed. (FOR ICD-10 E10.9, E11.9). 09/30/20   Biagio Borg, MD  carvedilol (COREG) 25 MG tablet Take 1 tablet (25 mg total) by mouth 2 (two) times daily with a meal. 02/23/20   [name] T, PA-C  clopidogrel (PLAVIX) 75 MG tablet Take 1 tablet (75 mg total) by mouth daily. 02/02/21   Biagio Borg, MD  empagliflozin (JARDIANCE) 10 MG TABS tablet Take 1 tablet (10 mg total) by mouth daily before breakfast. 01/18/21   [name] T, PA-C  gabapentin (NEURONTIN) 300 MG capsule TAKE 1 CAPSULE(300 MG) BY MOUTH THREE TIMES DAILY 01/31/21   Biagio Borg, MD  glucose blood (TRUE METRIX BLOOD GLUCOSE TEST) test strip Use to test blood sugars up to 4 times a day. DX: E10.9, E.11.9 12/26/20   Biagio Borg, MD  hydrocortisone cream 1 % Apply to affected area  2 times daily 02/02/21   Sherwood Gambler, MD  isosorbide mononitrate (IMDUR) 30 MG 24 hr tablet TAKE 3 TABLETS(90 MG) BY MOUTH DAILY 11/01/20   Biagio Borg, MD  Lancets 33G MISC Use to test blood sugars up to 4 times a day. DX: E10.9, E11.9 09/08/20   Biagio Borg, MD  Multiple Vitamins-Minerals (VITAMIN D3 COMPLETE PO) Take 1 tablet by mouth daily.    [provider]  nitroGLYCERIN (NITROSTAT) 0.4 MG SL tablet PLACE 1 TABLET UNDER THE TONGUE EVERY 5 MINUTES AS NEEDED FOR CHEST PAIN 11/20/19   Biagio Borg, MD  pantoprazole (PROTONIX) 40 MG tablet Take 1 tablet (40 mg total) by mouth daily. 02/02/21   Biagio Borg, MD  PARoxetine (PAXIL) 20 MG tablet Take 1 tablet (20 mg total) by mouth daily. 06/23/20   Biagio Borg, MD  potassium chloride (KLOR-CON) 10 MEQ tablet Take 2 tablets (20 mEq total) by mouth 2 (two) times daily. 02/03/21 02/03/22  [name] T, PA-C  rosuvastatin (CRESTOR) 40 MG tablet TAKE 1 TABLET(40 MG) BY MOUTH DAILY 01/20/21   [name] T, PA-C  spironolactone (ALDACTONE) 25 MG tablet Take 0.5 tablets (12.5 mg total) by mouth daily. 02/23/20   [name] T, PA-C  torsemide (DEMADEX) 20 MG tablet Take 1 tablet (20 mg total) by mouth 2 (two) times daily. 02/03/21 02/03/22  [name] T, PA-C  traMADol (ULTRAM) 50 MG tablet TAKE 1 TABLET(50 MG) BY MOUTH EVERY 6 HOURS AS NEEDED 01/20/21   Biagio Borg, MD  vitamin B-12 (CYANOCOBALAMIN) 1000 MCG tablet Take 1 tablet (1,000 mcg total) by mouth daily. 11/25/19   Biagio Borg, MD    Allergies    Penicillin g and Penicillins  Review of Systems   Review of Systems  Constitutional:  Negative for fever.  HENT:  Negative for sore throat.   Eyes:  Negative for visual disturbance.  Respiratory:  Negative for shortness of breath.   Cardiovascular:  Negative for chest pain.  Gastrointestinal:  Negative for abdominal pain, nausea and vomiting.  Genitourinary:  Negative for difficulty urinating.  Musculoskeletal:  Negative  for back pain and neck stiffness.  Skin:  Negative for rash.  Neurological:  Positive for dizziness and speech difficulty. Negative for syncope, weakness, numbness and headaches.   Physical Exam Updated Vital Signs BP (!) 124/91   Pulse 65   Temp 97.9 F (36.6 C) (Oral)   Resp 17   Ht $R'6\' 1"'Zp$  (1.854 m)   Wt (!) 144.2 kg   SpO2 95%   BMI 41.96 kg/m   Physical Exam Constitutional:      General: He is not in acute distress.    Appearance: Normal appearance. He is not ill-appearing.  HENT:     Head: Normocephalic and atraumatic.  Eyes:     General: No visual field deficit.    Extraocular Movements: Extraocular movements intact.     Conjunctiva/sclera: Conjunctivae normal.     Pupils: Pupils are equal, round, and reactive to light.  Cardiovascular:     Rate and Rhythm: Normal rate and regular rhythm.     Pulses: Normal pulses.  Pulmonary:     Effort: Pulmonary effort is normal. No respiratory distress.  Musculoskeletal:        General: No swelling or tenderness.     Cervical back: Normal range of motion.  Skin:    General: Skin is warm and dry.     Findings: No erythema or rash.  Neurological:     General: No focal deficit present.     Mental Status: He is alert and oriented to person, place, and time.     GCS: GCS eye subscore is 4. GCS verbal subscore is 5. GCS motor subscore is 6.     Cranial Nerves: No cranial nerve deficit, dysarthria or facial asymmetry.     Sensory: No sensory deficit.     Motor: No weakness or tremor.     Coordination: Coordination normal. Finger-Nose-Finger Test normal.     Gait: Gait normal.    ED Results / Procedures / Treatments   Labs (all labs ordered are listed, but only abnormal results are displayed) Labs Reviewed  CBC - Abnormal; Notable for the following components:      Result Value   HCT 52.1 (*)    All other components within normal limits  COMPREHENSIVE METABOLIC PANEL - Abnormal; Notable for the following components:    Glucose, Bld 137 (*)    Creatinine, Ser 1.61 (*)    GFR, Estimated 48 (*)    All other components within normal limits  URINALYSIS, ROUTINE W REFLEX MICROSCOPIC - Abnormal; Notable for the following components:   Glucose, UA >=500 (*)    All other components within normal limits  HEMOGLOBIN A1C - Abnormal; Notable for the following components:   Hgb A1c MFr Bld 10.6 (*)    All other components within normal limits  LIPID PANEL - Abnormal; Notable for the following components:   HDL 38 (*)    All other components within normal limits  BASIC METABOLIC PANEL - Abnormal; Notable for the following components:   Potassium 3.4 (*)    Glucose, Bld 156 (*)    Creatinine, Ser 1.42 (*)    GFR, Estimated 56 (*)    All other components within normal limits  I-STAT CHEM 8, ED - Abnormal; Notable for the following components:   BUN 29 (*)    Creatinine, Ser 1.50 (*)    Glucose, Bld 136 (*)    Calcium, Ion 0.95 (*)    Hemoglobin 18.0 (*)  HCT 53.0 (*)    All other components within normal limits  CBG MONITORING, ED - Abnormal; Notable for the following components:   Glucose-Capillary 161 (*)    All other components within normal limits  TROPONIN I (HIGH SENSITIVITY) - Abnormal; Notable for the following components:   Troponin I (High Sensitivity) 40 (*)    All other components within normal limits  TROPONIN I (HIGH SENSITIVITY) - Abnormal; Notable for the following components:   Troponin I (High Sensitivity) 37 (*)    All other components within normal limits  RESP PANEL BY RT-PCR (FLU A&B, COVID) ARPGX2  ETHANOL  PROTIME-INR  APTT  DIFFERENTIAL  RAPID URINE DRUG SCREEN, HOSP PERFORMED  HIV ANTIBODY (ROUTINE TESTING W REFLEX)  CBC  VITAMIN B12    EKG EKG Interpretation  Date/Time:  Sunday February 05 2021 14:23:20 EDT Ventricular Rate:  61 PR Interval:  132 QRS Duration: 175 QT Interval:  471 QTC Calculation: 475 R Axis:   266 Text Interpretation: Sinus rhythm Right bundle  branch block When compared with ECG of EARLIER SAME DATE No significant change was found Confirmed by Delora Fuel ([id number]) on 02/05/2021 11:30:10 PM  Radiology CT ANGIO HEAD W OR WO CONTRAST  Result Date: 02/05/2021 CLINICAL DATA:  Transient ischemic attack EXAM: CT ANGIOGRAPHY HEAD AND NECK TECHNIQUE: Multidetector CT imaging of the head and neck was performed using the standard protocol during bolus administration of intravenous contrast. Multiplanar CT image reconstructions and MIPs were obtained to evaluate the vascular anatomy. Carotid stenosis measurements (when applicable) are obtained utilizing NASCET criteria, using the distal internal carotid diameter as the denominator. CONTRAST:  76mL OMNIPAQUE IOHEXOL 350 MG/ML SOLN COMPARISON:  None. FINDINGS: CTA NECK FINDINGS SKELETON: There is no bony spinal canal stenosis. No lytic or blastic lesion. OTHER NECK: Normal pharynx, larynx and major salivary glands. No cervical lymphadenopathy. Unremarkable thyroid gland. UPPER CHEST: No pneumothorax or pleural effusion. No nodules or masses. AORTIC ARCH: There is no calcific atherosclerosis of the aortic arch. There is no aneurysm, dissection or hemodynamically significant stenosis of the visualized portion of the aorta. Conventional 3 vessel aortic branching pattern. The visualized proximal subclavian arteries are widely patent. RIGHT CAROTID SYSTEM: Normal without aneurysm, dissection or stenosis. LEFT CAROTID SYSTEM: Normal without aneurysm, dissection or stenosis. VERTEBRAL ARTERIES: Left dominant configuration. Both origins are clearly patent. There is no dissection, occlusion or flow-limiting stenosis to the skull base (V1-V3 segments). CTA HEAD FINDINGS POSTERIOR CIRCULATION: --Vertebral arteries: Normal V4 segments. --Inferior cerebellar arteries: Normal. --Basilar artery: Normal. --Superior cerebellar arteries: Normal. --Posterior cerebral arteries (PCA): Normal. ANTERIOR CIRCULATION: --Intracranial  internal carotid arteries: Normal. --Anterior cerebral arteries (ACA): Normal. Both A1 segments are present. Patent anterior communicating artery (a-comm). --Middle cerebral arteries (MCA): Normal. VENOUS SINUSES: As permitted by contrast timing, patent. ANATOMIC VARIANTS: None Review of the MIP images confirms the above findings. IMPRESSION: Normal CTA of the head and neck. Electronically Signed   By: Ulyses Jarred M.D.   On: 02/05/2021 21:58   DG Chest 1 View  Result Date: 02/05/2021 CLINICAL DATA:  Dizziness, shortness of breath EXAM: CHEST  1 VIEW COMPARISON:  Prior chest x-ray 02/02/2021 FINDINGS: Stable cardiac and mediastinal contours. Trace linear atelectasis versus scarring in the right middle lobe. Mild vascular congestion without edema. No focal airspace infiltrate, pleural effusion or pneumothorax. Stable mild bronchitic changes. No lytic or blastic osseous lesion. IMPRESSION: No active disease. Electronically Signed   By: Jacqulynn Cadet M.D.   On: 02/05/2021 11:33   CT HEAD  WO CONTRAST  Result Date: 02/05/2021 CLINICAL DATA:  Dizziness with ambulation, generalized weakness. EXAM: CT HEAD WITHOUT CONTRAST TECHNIQUE: Contiguous axial images were obtained from the base of the skull through the vertex without intravenous contrast. COMPARISON:  Head CT dated 09/15/2011. FINDINGS: Brain: Ventricles are within normal limits in size and configuration. Mild chronic small vessel ischemic changes within the bilateral periventricular and subcortical white matter regions. No mass, hemorrhage, edema or other evidence of acute parenchymal abnormality. No extra-axial hemorrhage. Vascular: Chronic calcified atherosclerotic changes of the large vessels at the skull base. No unexpected hyperdense vessel. Skull: Normal. Negative for fracture or focal lesion. Sinuses/Orbits: No acute finding. Other: None. IMPRESSION: 1. No acute findings. No intracranial mass, hemorrhage or edema. 2. Mild chronic small vessel  ischemic changes in the white matter. Electronically Signed   By: Franki Cabot M.D.   On: 02/05/2021 11:15   CT ANGIO NECK W OR WO CONTRAST  Result Date: 02/05/2021 CLINICAL DATA:  Transient ischemic attack EXAM: CT ANGIOGRAPHY HEAD AND NECK TECHNIQUE: Multidetector CT imaging of the head and neck was performed using the standard protocol during bolus administration of intravenous contrast. Multiplanar CT image reconstructions and MIPs were obtained to evaluate the vascular anatomy. Carotid stenosis measurements (when applicable) are obtained utilizing NASCET criteria, using the distal internal carotid diameter as the denominator. CONTRAST:  74mL OMNIPAQUE IOHEXOL 350 MG/ML SOLN COMPARISON:  None. FINDINGS: CTA NECK FINDINGS SKELETON: There is no bony spinal canal stenosis. No lytic or blastic lesion. OTHER NECK: Normal pharynx, larynx and major salivary glands. No cervical lymphadenopathy. Unremarkable thyroid gland. UPPER CHEST: No pneumothorax or pleural effusion. No nodules or masses. AORTIC ARCH: There is no calcific atherosclerosis of the aortic arch. There is no aneurysm, dissection or hemodynamically significant stenosis of the visualized portion of the aorta. Conventional 3 vessel aortic branching pattern. The visualized proximal subclavian arteries are widely patent. RIGHT CAROTID SYSTEM: Normal without aneurysm, dissection or stenosis. LEFT CAROTID SYSTEM: Normal without aneurysm, dissection or stenosis. VERTEBRAL ARTERIES: Left dominant configuration. Both origins are clearly patent. There is no dissection, occlusion or flow-limiting stenosis to the skull base (V1-V3 segments). CTA HEAD FINDINGS POSTERIOR CIRCULATION: --Vertebral arteries: Normal V4 segments. --Inferior cerebellar arteries: Normal. --Basilar artery: Normal. --Superior cerebellar arteries: Normal. --Posterior cerebral arteries (PCA): Normal. ANTERIOR CIRCULATION: --Intracranial internal carotid arteries: Normal. --Anterior cerebral  arteries (ACA): Normal. Both A1 segments are present. Patent anterior communicating artery (a-comm). --Middle cerebral arteries (MCA): Normal. VENOUS SINUSES: As permitted by contrast timing, patent. ANATOMIC VARIANTS: None Review of the MIP images confirms the above findings. IMPRESSION: Normal CTA of the head and neck. Electronically Signed   By: Ulyses Jarred M.D.   On: 02/05/2021 21:58   MR BRAIN WO CONTRAST  Result Date: 02/05/2021 CLINICAL DATA:  Vertigo, central. Dizziness. Fall last night. Generalized weakness. Resolved speech disturbance. EXAM: MRI HEAD WITHOUT CONTRAST TECHNIQUE: Multiplanar, multiecho pulse sequences of the brain and surrounding structures were obtained without intravenous contrast. COMPARISON:  Head CT 02/05/2021 FINDINGS: Brain: There is no evidence of an acute infarct, intracranial hemorrhage, mass, midline shift, or extra-axial fluid collection. T2 hyperintensities in the cerebral white matter bilaterally are nonspecific but compatible with moderately age advanced chronic small vessel ischemic disease. There is a small chronic left cerebellar infarct. Mild cerebral atrophy is within normal limits for age. Vascular: Major intracranial vascular flow voids are preserved. Skull and upper cervical spine: Unremarkable bone marrow signal. Sinuses/Orbits: Unremarkable orbits. Mild mucosal thickening in the ethmoid and maxillary sinuses. Clear mastoid  air cells. Other: None. IMPRESSION: 1. No acute intracranial abnormality. 2. Moderate chronic small vessel ischemic disease. 3. Small chronic left cerebellar infarct. Electronically Signed   By: Logan Bores M.D.   On: 02/05/2021 18:33    Procedures Procedures   Medications Ordered in ED Medications  aspirin chewable tablet 81 mg (81 mg Oral Given 02/06/21 0930)  rosuvastatin (CRESTOR) tablet 40 mg (40 mg Oral Given 02/06/21 0930)  pantoprazole (PROTONIX) EC tablet 40 mg (40 mg Oral Given 02/06/21 0930)  0.9 %  sodium chloride infusion  (0 mLs Intravenous Stopped 02/06/21 0644)  acetaminophen (TYLENOL) tablet 650 mg (has no administration in time range)    Or  acetaminophen (TYLENOL) 160 MG/5ML solution 650 mg (has no administration in time range)    Or  acetaminophen (TYLENOL) suppository 650 mg (has no administration in time range)  senna-docusate (Senokot-S) tablet 1 tablet (has no administration in time range)  enoxaparin (LOVENOX) injection 70 mg (70 mg Subcutaneous Given 02/05/21 2217)  witch hazel-glycerin (TUCKS) pad (1 application Topical Given 02/06/21 0023)  carvedilol (COREG) tablet 25 mg (25 mg Oral Given 02/06/21 0824)  clopidogrel (PLAVIX) tablet 75 mg (75 mg Oral Given 02/06/21 0930)  empagliflozin (JARDIANCE) tablet 10 mg (has no administration in time range)  gabapentin (NEURONTIN) capsule 300 mg (300 mg Oral Given 02/06/21 0930)  isosorbide mononitrate (IMDUR) 24 hr tablet 90 mg (90 mg Oral Given 02/06/21 0930)  nitroGLYCERIN (NITROSTAT) SL tablet 0.4 mg (has no administration in time range)  PARoxetine (PAXIL) tablet 20 mg (20 mg Oral Given 02/06/21 0930)  traMADol (ULTRAM) tablet 50 mg (has no administration in time range)  vitamin B-12 (CYANOCOBALAMIN) tablet 1,000 mcg (1,000 mcg Oral Given 02/06/21 0930)  insulin aspart (novoLOG) injection 0-9 Units (2 Units Subcutaneous Given 02/06/21 0821)  docusate sodium (COLACE) capsule 100 mg (100 mg Oral Given 02/06/21 0930)  potassium chloride SA (KLOR-CON) CR tablet 20 mEq (has no administration in time range)   stroke: mapping our early stages of recovery book ( Does not apply Given 02/06/21 0829)  iohexol (OMNIPAQUE) 350 MG/ML injection 50 mL (50 mLs Intravenous Contrast Given 02/05/21 2147)    ED Course  I have reviewed the triage vital signs and the nursing notes.  Pertinent labs & imaging results that were available during my care of the patient were reviewed by me and considered in my medical decision making (see chart for details).    MDM  Rules/Calculators/A&P                            65yo male with history of CAD, combined systolic and diastolic CHF, DM, htn, hlpd, OSA, presents with concern for episode of dizziness and word finding difficulties.  Recently had diuretics increased however history is not consistent with near-syncope, and that would not explain aphasia symptoms.  Cr slightly increased from previous. No anemia CT head without acute abnormalities. MRI shows chronic left cerebellar infarct.   Given multiple risk factors and history concerning for TIA, consulted Dr. Cheral Marker of Neurology and will admit to hospitalist for further evaluation.     Final Clinical Impression(s) / ED Diagnoses Final diagnoses:  TIA (transient ischemic attack)    Rx / DC Orders ED Discharge Orders     None        Gareth Morgan, MD 02/06/21 [phone]

## 2021-02-05 NOTE — ED Notes (Signed)
Neurology at pt bedside

## 2021-02-05 NOTE — ED Notes (Signed)
Pt given sandwich bag & soda.

## 2021-02-05 NOTE — H&P (Signed)
History and Physical   [name] [name] MGN:[id number] DOB: 1955-10-20 DOA: 02/05/2021  PCP: Biagio Borg, MD   Patient coming from: Home  Chief Complaint: Dizziness, weakness, word finding difficulty  HPI: [name] [name] is a 65 y.o. male with medical history significant of prostatitis, anxiety, depression, B12 deficiency, plantar fasciitis, CAD status post stenting x45, CHF, CKD 3, diabetes, hypertension, hemorrhoids, GERD, hyperlipidemia, lumbar spinal stenosis, obesity, OSA, neuropathy presenting following episodes of dizziness with weakness and word finding difficulty.  Patient states that he fell while going to bed last night and before that he was last completely normal at 9 PM.  He states that when he woke around 5 AM this morning he felt dizzy and he also reported a woozy sensation and generalized weakness.  He feels that the symptoms gradually improved throughout the day.  He additionally reports that when he was having his symptoms earlier today he noticed he was having some word finding difficulties which has also improved.  He denies fevers, chills, chest pain, shortness of breath, abdominal pain, constipation, diarrhea, nausea, vomiting.  ED Course: Vital signs in the ED were stable.  Lab work-up showed CMP significant for creatinine 1.61 which is near his baseline of 1.4.  Glucose 137.  CBC within normal limits.  PT, PTT, INR within normal limits.  Troponin flat at 40 and then 37 on repeat.  Respiratory panel for flu and COVID-negative.  Ethanol level negative.  UDS negative, urinalysis within normal limits.  Chest x-ray showed no acute abnormality.  CT head showed no acute abnormality.  MR brain showed no acute abnormality but did demonstrate small chronic left cerebellar infarct.  Neurology was consulted in the ED and recommend admission for TIA work-up.  Review of Systems: As per HPI otherwise all other systems reviewed and are negative.  Past Medical History:  Diagnosis Date    CAD (coronary artery disease)    a. s/p BMS pRCA 2002; b. DES to pRCA & DES p/m RCA 2006; c. PCI/DES OM4 2007; d. PCI/CBA to RCA for ISR 10/2006; e.08/2012 STEMI/Cath/PCI:  LAD 95% >> PCI: Promus Prem DES  //  f. NSTEMI 10/15 >> LHC: mLAD stent ok, dLAD 70, OM1 CTO, OM2 stent ok, dOM2 90, OM3 30-40, dLCx 90, p-mRCA stent ok, mRCA stent ok w/ 60-70 ISR, EF 50% >> med Rx // MV 1/17: no ischemia // Cath ([facility]) 05/2019: RCA 100 (CTO)    Combined systolic and diastolic CHF    a. Myoview 1/17: EF 35%  //  b. Echo 1/17 EF 45-50% // Echo 05/2019: EF 50-55 // Echocardiogram 8/21: EF 50, inf AK, post and mid inf HK, mod LVH, RVSP 25.3, mild to mod LAE, trivial MR    Depression    Diabetes mellitus ([place])    a. A1c 8.8 08/2012->Metformin initiated. => b. A1c (9/14): 6.6   Gastroesophageal reflux disease    History of nuclear stress test    a. Myoview 1/17: EF 35%, fixed inferior lateral defect consistent with infarct, no ischemia, intermediate risk   History of pneumonia    HTN (hypertension)    Hx of NSTEMI    Hyperlipidemia    Ischemic cardiomyopathy    a. EF 40%; improved to normal;  b. 08/2012 Echo: EF 50-55%, mod LVH.//  c. Echo 9/16: Inf HK, mild LVH, EF 55%, mild LAE, normal RVF, mild RAE, PASP 35 mmHg  //  d. Echo 1/17: EF 45-50%, inferior HK, mild BAE, PASP 33 mmHg   Obesity  OSA (obstructive sleep apnea)    Does not use CPAP as of 05/2011   Tobacco abuse     Past Surgical History:  Procedure Laterality Date   CORONARY ANGIOPLASTY WITH STENT PLACEMENT     CORONARY STENT PLACEMENT  2009   LEFT HEART CATHETERIZATION WITH CORONARY ANGIOGRAM N/A 08/31/2012   Procedure: LEFT HEART CATHETERIZATION WITH CORONARY ANGIOGRAM;  Surgeon: Burnell Blanks, MD;  Location: [facility] CATH LAB;  Service: Cardiovascular;  Laterality: N/A;   LEFT HEART CATHETERIZATION WITH CORONARY ANGIOGRAM N/A 11/16/2013   Procedure: LEFT HEART CATHETERIZATION WITH CORONARY ANGIOGRAM;  Surgeon: Blane Ohara, MD;  Location: [facility]  CATH LAB;  Service: Cardiovascular;  Laterality: N/A;   LEFT HEART CATHETERIZATION WITH CORONARY ANGIOGRAM N/A 03/15/2014   Procedure: LEFT HEART CATHETERIZATION WITH CORONARY ANGIOGRAM;  Surgeon: Peter M Martinique, MD;  Location: [facility] CATH LAB;  Service: Cardiovascular;  Laterality: N/A;   PERCUTANEOUS CORONARY STENT INTERVENTION (PCI-S) Right 08/31/2012   Procedure: PERCUTANEOUS CORONARY STENT INTERVENTION (PCI-S);  Surgeon: Burnell Blanks, MD;  Location: [facility] CATH LAB;  Service: Cardiovascular;  Laterality: Right;   PERCUTANEOUS CORONARY STENT INTERVENTION (PCI-S)  11/16/2013   Procedure: PERCUTANEOUS CORONARY STENT INTERVENTION (PCI-S);  Surgeon: Blane Ohara, MD;  Location: [facility] CATH LAB;  Service: Cardiovascular;;    Social History  reports that he has quit smoking. His smoking use included cigarettes. He has a 15.00 pack-year smoking history. He has never used smokeless tobacco. He reports that he does not drink alcohol and does not use drugs.  Allergies  Allergen Reactions   Penicillin G Other (See Comments)    Did it involve swelling of the face/tongue/throat, SOB, or low BP?  N/A Did it involve sudden or severe rash/hives, skin peeling, or any reaction on the inside of your mouth or nose? N/A Did you need to seek medical attention at a hospital or doctor's office? N/A When did it last happen? Child If all above answers are "NO", may proceed with cephalosporin use.   Penicillins Other (See Comments)    Did it involve swelling of the face/tongue/throat, SOB, or low BP? N/A Did it involve sudden or severe rash/hives, skin peeling, or any reaction on the inside of your mouth or nose?N/A Did you need to seek medical attention at a hospital or doctor's office? N/A When did it last happen? Child      If all above answers are "NO", may proceed with cephalosporin use.    Family History  Problem Relation Age of Onset   Depression Mother    Cancer Mother        ovarian   Hypertension  Mother        Died, 31   Other Father        motor vehicle accident   Coronary artery disease Other    Heart attack Neg Hx    Stroke Neg Hx   Reviewed on admission  Prior to Admission medications   Medication Sig Start Date End Date Taking? Authorizing Provider  Alcohol Swabs (B-D SINGLE USE SWABS BUTTERFLY) PADS Use up to 4 times a day to test blood sugars. Dx: E10.9, E11.9 09/30/20   Biagio Borg, MD  aspirin 81 MG tablet Take 81 mg by mouth daily.    [provider]  Blood Glucose Calibration (TRUE METRIX LEVEL 1) Low SOLN Use as needed. Dx: E10.9, E11.9 01/12/21   Biagio Borg, MD  blood glucose meter kit and supplies Dispense based on patient and insurance preference. Use  up to four times daily as directed. (FOR ICD-10 E10.9, E11.9). 09/30/20   Biagio Borg, MD  carvedilol (COREG) 25 MG tablet Take 1 tablet (25 mg total) by mouth 2 (two) times daily with a meal. 02/23/20   [name] T, PA-C  clopidogrel (PLAVIX) 75 MG tablet Take 1 tablet (75 mg total) by mouth daily. 02/02/21   Biagio Borg, MD  empagliflozin (JARDIANCE) 10 MG TABS tablet Take 1 tablet (10 mg total) by mouth daily before breakfast. 01/18/21   [name] T, PA-C  gabapentin (NEURONTIN) 300 MG capsule TAKE 1 CAPSULE(300 MG) BY MOUTH THREE TIMES DAILY 01/31/21   Biagio Borg, MD  glucose blood (TRUE METRIX BLOOD GLUCOSE TEST) test strip Use to test blood sugars up to 4 times a day. DX: E10.9, E.11.9 12/26/20   Biagio Borg, MD  hydrocortisone cream 1 % Apply to affected area 2 times daily 02/02/21   Sherwood Gambler, MD  isosorbide mononitrate (IMDUR) 30 MG 24 hr tablet TAKE 3 TABLETS(90 MG) BY MOUTH DAILY 11/01/20   Biagio Borg, MD  Lancets 33G MISC Use to test blood sugars up to 4 times a day. DX: E10.9, E11.9 09/08/20   Biagio Borg, MD  Multiple Vitamins-Minerals (VITAMIN D3 COMPLETE PO) Take 1 tablet by mouth daily.    [provider]  nitroGLYCERIN (NITROSTAT) 0.4 MG SL tablet PLACE 1 TABLET  UNDER THE TONGUE EVERY 5 MINUTES AS NEEDED FOR CHEST PAIN 11/20/19   Biagio Borg, MD  pantoprazole (PROTONIX) 40 MG tablet Take 1 tablet (40 mg total) by mouth daily. 02/02/21   Biagio Borg, MD  PARoxetine (PAXIL) 20 MG tablet Take 1 tablet (20 mg total) by mouth daily. 06/23/20   Biagio Borg, MD  potassium chloride (KLOR-CON) 10 MEQ tablet Take 2 tablets (20 mEq total) by mouth 2 (two) times daily. 02/03/21 02/03/22  [name] T, PA-C  rosuvastatin (CRESTOR) 40 MG tablet TAKE 1 TABLET(40 MG) BY MOUTH DAILY 01/20/21   [name] T, PA-C  spironolactone (ALDACTONE) 25 MG tablet Take 0.5 tablets (12.5 mg total) by mouth daily. 02/23/20   [name] T, PA-C  torsemide (DEMADEX) 20 MG tablet Take 1 tablet (20 mg total) by mouth 2 (two) times daily. 02/03/21 02/03/22  [name] T, PA-C  traMADol (ULTRAM) 50 MG tablet TAKE 1 TABLET(50 MG) BY MOUTH EVERY 6 HOURS AS NEEDED 01/20/21   Biagio Borg, MD  vitamin B-12 (CYANOCOBALAMIN) 1000 MCG tablet Take 1 tablet (1,000 mcg total) by mouth daily. 11/25/19   Biagio Borg, MD    Physical Exam: Vitals:   02/05/21 1630 02/05/21 1730 02/05/21 1817 02/05/21 1918  BP: (!) 141/98 (!) 162/91 (!) 132/103 (!) 130/106  Pulse: 62 60 82 81  Resp: _0 Temp:      TempSrc:      SpO2: 98% 100% 100% 100%   Physical Exam Constitutional:      General: He is not in acute distress.    Appearance: Normal appearance. He is obese.  HENT:     Head: Normocephalic and atraumatic.     Mouth/Throat:     Mouth: Mucous membranes are moist.     Pharynx: Oropharynx is clear.  Eyes:     Extraocular Movements: Extraocular movements intact.     Pupils: Pupils are equal, round, and reactive to light.  Cardiovascular:     Rate and Rhythm: Normal rate and regular rhythm.  Pulses: Normal pulses.     Heart sounds: Normal heart sounds.  Pulmonary:     Effort: Pulmonary effort is normal. No respiratory distress.     Breath sounds: Normal breath sounds.   Abdominal:     General: Bowel sounds are normal. There is no distension.     Palpations: Abdomen is soft.     Tenderness: There is no abdominal tenderness.  Musculoskeletal:        General: No swelling or deformity.  Skin:    General: Skin is warm and dry.  Neurological:     Comments: Mental Status: Patient is awake, alert, oriented x3  No signs of aphasia or neglect Cranial Nerves: II: Pupils equal, round, and reactive to light.   III,IV, VI: EOMI without ptosis or diploplia.  V: Facial sensation is symmetric to light touch. VII: Facial movement is symmetric.  VIII: hearing is intact to voice X: Uvula elevates symmetrically XI: Shoulder shrug is symmetric. XII: tongue is midline without atrophy or fasciculations.  Motor: Good effort thorughout, at Least 5/5 bilateral UE, 5/5 bilateral lower extremitiy  Sensory: Sensation is grossly intact bilateral UEs & LEs Cerebellar: Finger-Nose intact bilalat      Labs on Admission: I have personally reviewed following labs and imaging studies  CBC: Recent Labs  Lab 02/02/21 0827 02/05/21 1023 02/05/21 1059  WBC 7.6 7.1  --   NEUTROABS 4.3 2.8  --   HGB 15.9 16.7 18.0*  HCT 48.3 52.1* 53.0*  MCV 89.8 91.1  --   PLT 229 244  --     Basic Metabolic Panel: Recent Labs  Lab 02/02/21 0827 02/05/21 1023 02/05/21 1059  NA 139 138 139  K 4.0 3.7 4.1  CL 105 103 108  CO2 24 25  --   GLUCOSE 154* 137* 136*  BUN 24* 22 29*  CREATININE 1.35* 1.61* 1.50*  CALCIUM 9.4 9.7  --     GFR: Estimated Creatinine Clearance: 75.3 mL/min (A) (by C-G formula based on SCr of 1.5 mg/dL (H)).  Liver Function Tests: Recent Labs  Lab 02/02/21 0827 02/05/21 1023  AST 26 20  ALT 21 22  ALKPHOS 61 59  BILITOT 0.8 0.9  PROT 7.0 7.6  ALBUMIN 3.7 3.9    Urine analysis:    Component Value Date/Time   COLORURINE YELLOW 02/05/2021 1929   APPEARANCEUR CLEAR 02/05/2021 1929   LABSPEC 1.018 02/05/2021 1929   PHURINE 5.0 02/05/2021 1929    GLUCOSEU >=500 (A) 02/05/2021 1929   GLUCOSEU NEGATIVE 06/23/2020 [place] 02/05/2021 1929   HGBUR negative 08/23/2009 0000   BILIRUBINUR NEGATIVE 02/05/2021 1929   KETONESUR NEGATIVE 02/05/2021 1929   PROTEINUR NEGATIVE 02/05/2021 1929   UROBILINOGEN 1.0 06/23/2020 0849   NITRITE NEGATIVE 02/05/2021 1929   LEUKOCYTESUR NEGATIVE 02/05/2021 1929    Radiological Exams on Admission: DG Chest 1 View  Result Date: 02/05/2021 CLINICAL DATA:  Dizziness, shortness of breath EXAM: CHEST  1 VIEW COMPARISON:  Prior chest x-ray 02/02/2021 FINDINGS: Stable cardiac and mediastinal contours. Trace linear atelectasis versus scarring in the right middle lobe. Mild vascular congestion without edema. No focal airspace infiltrate, pleural effusion or pneumothorax. Stable mild bronchitic changes. No lytic or blastic osseous lesion. IMPRESSION: No active disease. Electronically Signed   By: Jacqulynn Cadet M.D.   On: 02/05/2021 11:33   CT HEAD WO CONTRAST  Result Date: 02/05/2021 CLINICAL DATA:  Dizziness with ambulation, generalized weakness. EXAM: CT HEAD WITHOUT CONTRAST TECHNIQUE: Contiguous axial images were obtained  from the base of the skull through the vertex without intravenous contrast. COMPARISON:  Head CT dated 09/15/2011. FINDINGS: Brain: Ventricles are within normal limits in size and configuration. Mild chronic small vessel ischemic changes within the bilateral periventricular and subcortical white matter regions. No mass, hemorrhage, edema or other evidence of acute parenchymal abnormality. No extra-axial hemorrhage. Vascular: Chronic calcified atherosclerotic changes of the large vessels at the skull base. No unexpected hyperdense vessel. Skull: Normal. Negative for fracture or focal lesion. Sinuses/Orbits: No acute finding. Other: None. IMPRESSION: 1. No acute findings. No intracranial mass, hemorrhage or edema. 2. Mild chronic small vessel ischemic changes in the white matter.  Electronically Signed   By: Franki Cabot M.D.   On: 02/05/2021 11:15   MR BRAIN WO CONTRAST  Result Date: 02/05/2021 CLINICAL DATA:  Vertigo, central. Dizziness. Fall last night. Generalized weakness. Resolved speech disturbance. EXAM: MRI HEAD WITHOUT CONTRAST TECHNIQUE: Multiplanar, multiecho pulse sequences of the brain and surrounding structures were obtained without intravenous contrast. COMPARISON:  Head CT 02/05/2021 FINDINGS: Brain: There is no evidence of an acute infarct, intracranial hemorrhage, mass, midline shift, or extra-axial fluid collection. T2 hyperintensities in the cerebral white matter bilaterally are nonspecific but compatible with moderately age advanced chronic small vessel ischemic disease. There is a small chronic left cerebellar infarct. Mild cerebral atrophy is within normal limits for age. Vascular: Major intracranial vascular flow voids are preserved. Skull and upper cervical spine: Unremarkable bone marrow signal. Sinuses/Orbits: Unremarkable orbits. Mild mucosal thickening in the ethmoid and maxillary sinuses. Clear mastoid air cells. Other: None. IMPRESSION: 1. No acute intracranial abnormality. 2. Moderate chronic small vessel ischemic disease. 3. Small chronic left cerebellar infarct. Electronically Signed   By: Logan Bores M.D.   On: 02/05/2021 18:33    EKG: Independently reviewed.  Sinus rhythm at 65 bpm.  Right bundle branch block.  Similar to previous.  Assessment/Plan Principal Problem:   TIA (transient ischemic attack) Active Problems:   GASTROESOPHAGEAL REFLUX DISEASE   DM2 (diabetes mellitus, type 2) (HCC)   CAD S/P multiple PCI   Hyperlipidemia   Peripheral neuropathy (HCC)   Left lumbar radiculopathy   CKD (chronic kidney disease), stage III (HCC)   Chronic heart failure with preserved ejection fraction (HFpEF) (HCC)  TIA > Patient presented after episodes of dizziness and general weakness with word finding difficulty resolved. > He has no  current infarct on CT head or MRI brain.  Does have small chronic left cerebellar infarct which he did not know about.  Also with multiple risk factors including hypertension, hyperlipidemia, other arterial disease. > Neurology is consulted in the ED and are following. - Neurology consult - Continue home aspirin statin  - Echocardiogram  - CTA head & neck  - A1C  - Lipid panel  - Tele monitoring  - SLP eval - PT/OT  CAD > Status post multiple stenting (x4-5) - Continue home aspirin, Plavix, statin, Coreg, Imdur  CHF > Last echo a year ago with EF 50% and normal diastolic function.  Normal RV function as well. - Continue home torsemide, Jardiance, Imdur, Coreg, spironolactone  CKD 3A > Creatinine stable at 1.61 from baseline of around 1.4 in the ED. > We will give some gentle fluids given this mild elevation in his creatinine and that he will be receiving contrast for his CT head and neck. - Avoid nephrotoxic agents - Trend renal function electrolytes  Diabetes - Continuing Jardiance for dual indication of this and CHF - SSI   Lumbar  spinal stenosis Neuropathy - Continue home tramadol and gabapentin  GERD - Continue home PPI  Hypertension - Continuing home torsemide, Imdur, Coreg, spironolactone as above.  HLD - Continue on statin  DVT prophylaxis: Lovenox  Code Status:   Full  Family Communication:  None on admission  Disposition Plan:   Patient is from:  Home  Anticipated DC to:  Home  Anticipated DC date:  1 to 2 days  Anticipated DC barriers: None  Consults called:  Neurology, consulted by EDP.   Admission status:  Observation, telemetry   Severity of Illness: The appropriate patient status for this patient is OBSERVATION. Observation status is judged to be reasonable and necessary in order to provide the required intensity of service to ensure the patient's safety. The patient's presenting symptoms, physical exam findings, and initial radiographic and  laboratory data in the context of their medical condition is felt to place them at decreased risk for further clinical deterioration. Furthermore, it is anticipated that the patient will be medically stable for discharge from the hospital within 2 midnights of admission. The following factors support the patient status of observation.   " The patient's presenting symptoms include weakness, dizziness, word finding difficulty that has improved. " The physical exam findings include stable physical exam. " The initial radiographic and laboratory data are Lab work-up showed CMP significant for creatinine 1.61 which is near his baseline of 1.4.  Glucose 137.  CBC within normal limits.  PT, PTT, INR within normal limits.  Troponin flat at 40 and then 37 on repeat.  Respiratory panel for flu and COVID-negative.  Ethanol level negative.  UDS negative, urinalysis within normal limits.  Chest x-ray showed no acute abnormality.  CT head showed no acute abnormality.  MR brain showed no acute abnormality but did demonstrate small chronic left cerebellar infarct.   Marcelyn Bruins MD [facility]  How to contact the [facility] Attending or Consulting provider [place] or covering provider during after hours [place], for this patient?   Check the care team in [facility] and look for a) attending/consulting [facility] provider listed and b) the [facility] team listed Log into [email or web address] and use [place]'s universal password to access. If you do not have the password, please contact the hospital operator. Locate the [facility] provider you are looking for under [facility] and page to a number that you can be directly reached. If you still have difficulty reaching the provider, please page the [facility] (Director on Call) for the Hospitalists listed on amion for assistance.  02/05/2021, 8:49 PM

## 2021-02-05 NOTE — ED Triage Notes (Signed)
Pt bib ems for dizziness with ambulation, and generalized weakness, recent changes in his diuretic meds.  Pt a.ox4, 12 lead unremarkable.  Denies n/v

## 2021-02-06 ENCOUNTER — Observation Stay (HOSPITAL_BASED_OUTPATIENT_CLINIC_OR_DEPARTMENT_OTHER): Payer: Medicare HMO

## 2021-02-06 DIAGNOSIS — I509 Heart failure, unspecified: Secondary | ICD-10-CM | POA: Diagnosis not present

## 2021-02-06 DIAGNOSIS — G459 Transient cerebral ischemic attack, unspecified: Secondary | ICD-10-CM

## 2021-02-06 DIAGNOSIS — R7989 Other specified abnormal findings of blood chemistry: Secondary | ICD-10-CM

## 2021-02-06 LAB — ECHOCARDIOGRAM COMPLETE
AR max vel: 3.86 cm2
AV Area VTI: 3.64 cm2
AV Area mean vel: 3.6 cm2
AV Mean grad: 6 mmHg
AV Peak grad: 10.1 mmHg
Ao pk vel: 1.59 m/s
Area-P 1/2: 1.78 cm2
Calc EF: 51.2 %
Height: 73 in
S' Lateral: 4 cm
Single Plane A2C EF: 44.2 %
Single Plane A4C EF: 55.8 %
Weight: 5088 [oz_av]

## 2021-02-06 LAB — LIPID PANEL
Cholesterol: 140 mg/dL (ref 0–200)
HDL: 38 mg/dL — ABNORMAL LOW
LDL Cholesterol: 85 mg/dL (ref 0–99)
Total CHOL/HDL Ratio: 3.7 ratio
Triglycerides: 84 mg/dL
VLDL: 17 mg/dL (ref 0–40)

## 2021-02-06 LAB — CBC
HCT: 50.3 % (ref 39.0–52.0)
Hemoglobin: 16.1 g/dL (ref 13.0–17.0)
MCH: 29 pg (ref 26.0–34.0)
MCHC: 32 g/dL (ref 30.0–36.0)
MCV: 90.5 fL (ref 80.0–100.0)
Platelets: 265 10*3/uL (ref 150–400)
RBC: 5.56 MIL/uL (ref 4.22–5.81)
RDW: 14.6 % (ref 11.5–15.5)
WBC: 6.2 10*3/uL (ref 4.0–10.5)
nRBC: 0 % (ref 0.0–0.2)

## 2021-02-06 LAB — CBG MONITORING, ED: Glucose-Capillary: 161 mg/dL — ABNORMAL HIGH (ref 70–99)

## 2021-02-06 LAB — BASIC METABOLIC PANEL
Anion gap: 10 (ref 5–15)
BUN: 23 mg/dL (ref 8–23)
CO2: 24 mmol/L (ref 22–32)
Calcium: 9.3 mg/dL (ref 8.9–10.3)
Chloride: 105 mmol/L (ref 98–111)
Creatinine, Ser: 1.42 mg/dL — ABNORMAL HIGH (ref 0.61–1.24)
GFR, Estimated: 56 mL/min — ABNORMAL LOW (ref >60)
Glucose, Bld: 156 mg/dL — ABNORMAL HIGH (ref 70–99)
Potassium: 3.4 mmol/L — ABNORMAL LOW (ref 3.5–5.1)
Sodium: 139 mmol/L (ref 135–145)

## 2021-02-06 LAB — HEMOGLOBIN A1C
Hgb A1c MFr Bld: 10.6 % — ABNORMAL HIGH (ref 4.8–5.6)
Mean Plasma Glucose: 257.52 mg/dL

## 2021-02-06 LAB — GLUCOSE, CAPILLARY
Glucose-Capillary: 223 mg/dL — ABNORMAL HIGH (ref 70–99)
Glucose-Capillary: 146 mg/dL — ABNORMAL HIGH (ref 70–99)

## 2021-02-06 LAB — VITAMIN B12: Vitamin B-12: 308 pg/mL (ref 180–914)

## 2021-02-06 MED ORDER — ATORVASTATIN CALCIUM 80 MG PO TABS: 80.0000 mg | ORAL_TABLET | Freq: Every day | ORAL | Status: DC

## 2021-02-06 MED ORDER — CLOPIDOGREL BISULFATE 75 MG PO TABS
225.0000 mg | ORAL_TABLET | Freq: Once | ORAL | Status: AC
  Administered 2021-02-06: 225 mg via ORAL
  Filled 2021-02-06: qty 3

## 2021-02-06 MED ORDER — POTASSIUM CHLORIDE CRYS ER 20 MEQ PO TBCR
20.0000 meq | EXTENDED_RELEASE_TABLET | Freq: Once | ORAL | Status: AC
  Administered 2021-02-06: 20 meq via ORAL
  Filled 2021-02-06: qty 1

## 2021-02-06 MED ORDER — ASPIRIN 81 MG PO TABS: 81.0000 mg | ORAL_TABLET | Freq: Every day | ORAL | 3 refills | Status: DC

## 2021-02-06 MED ORDER — GLIPIZIDE 5 MG PO TABS: 5.0000 mg | ORAL_TABLET | Freq: Two times a day (BID) | ORAL | 11 refills | Status: DC

## 2021-02-06 MED ORDER — DOCUSATE SODIUM 100 MG PO CAPS
100.0000 mg | ORAL_CAPSULE | Freq: Every day | ORAL | Status: DC
  Administered 2021-02-06: 100 mg via ORAL
  Filled 2021-02-06: qty 1

## 2021-02-06 MED ORDER — ATORVASTATIN CALCIUM 80 MG PO TABS: 80.0000 mg | ORAL_TABLET | Freq: Every day | ORAL | 3 refills | Status: DC

## 2021-02-06 MED ORDER — SODIUM CHLORIDE 0.9 % IV SOLN: INTRAVENOUS | Status: DC

## 2021-02-06 MED ORDER — CLOPIDOGREL BISULFATE 75 MG PO TABS: 75.0000 mg | ORAL_TABLET | Freq: Every day | ORAL | 3 refills | Status: DC

## 2021-02-06 NOTE — Consult Note (Signed)
Cardiology Consultation:   Patient ID: [name] [name] MRN: [id number]; DOB: 1955/09/15  Admit date: 02/05/2021 Date of Consult: 02/06/2021  PCP:  Biagio Borg, MD   [facility] Providers Cardiologist:  Sherren Mocha, MD  Cardiology APP:  Liliane Shi, PA-C  {    Patient Profile:   [name] [name] is a 65 y.o. male with a hx of CAD s/p multiple stent, chronic heart failure with preserved LV function, hypertension, hyperlipidemia, diabetes mellitus, CKD III and obstructive sleep apnea who is being seen 02/06/2021 for the evaluation of CHF at the request of Dr. Tyrell Antonio.  Hx of Coronary artery disease S/p PCI to RCA and PL1 S/p cutting POBA to RCA 2/2 ISR in 5/08 S/p ant STEMI in 3/14 >> PCI: DES to LAD S/p NSTEMI in 6/15 >> PCI: DES to [place] S/p NSTEMI 10/15 >> Med Rx Myoview 1/17: no ischemia Cath at [facility] 05/2019: RCA 100 CTO  Hx of Heart failure with preserved ejection fraction  Ischemic CM Echocardiogram 05/2019 ([facility]): EF 50-55 Echocardiogram 8/21: EF 50, inf AK  ZIO monitor September 2021 showed sinus rhythm.  No A. fib or atrial flutter.  6% burden of PVC.  17 seconds of SVT.   Most recently seen by [name].  Noted in acute heart failure secondary to dietary noncompliance of salt.  Increased torsemide temporarily.  History of Present Illness:   Mr. Mershon was in usual state of health when going to bed on Saturday night at 9 PM.  He woke up around 5 AM on Sunday morning with generalized weakness and dizziness.  He had some word finding difficulty.  Symptoms somewhat improved but persisted and came to ER yesterday evening for further evaluation.  CT of head without acute abnormality.  MRI of brain without acute finding but showed chronic left cerebellar infract.  Seen by neurology and admitted for TIA work-up.  COVID negative.  Alcohol level normal. Hs-troponin 40>>37. (Was 25 on 8/25 when seen in ER for CHF exacerbation).  Creatinine 1.42.  Potassium 3.4.  Echocardiogram  showed mildly reduced LV function at 45 to [id number], grade 1 diastolic dysfunction, moderate LVH, normal RV function.  Neurology is asked for assessment of CHF and mildly elevated troponin.  Echo 02/06/2021  1. Left ventricular ejection fraction, by estimation, is 45 to 50%. The  left ventricle has mildly decreased function. The left ventricle  demonstrates global hypokinesis. There is moderate left ventricular  hypertrophy. Left ventricular diastolic  parameters are consistent with Grade I diastolic dysfunction (impaired  relaxation).   2. Right ventricular systolic function is normal. The right ventricular  size is normal. There is normal pulmonary artery systolic pressure. The  estimated right ventricular systolic pressure is 0000000 mmHg.   3. Left atrial size was mildly dilated.   4. The mitral valve is normal in structure. No evidence of mitral valve  regurgitation. No evidence of mitral stenosis.   5. The aortic valve is tricuspid. Aortic valve regurgitation is trivial.  No aortic stenosis is present.   6. Aortic dilatation noted. There is mild dilatation of the aortic root,  measuring 37 mm.   7. The inferior vena cava is normal in size with greater than 50%  respiratory variability, suggesting right atrial pressure of 3 mmHg.   Past Medical History:  Diagnosis Date   CAD (coronary artery disease)    a. s/p BMS pRCA 2002; b. DES to pRCA & DES p/m RCA 2006; c. PCI/DES OM4 2007; d. PCI/CBA to RCA for ISR  10/2006; e.08/2012 STEMI/Cath/PCI:  LAD 95% >> PCI: Promus Prem DES  //  f. NSTEMI 10/15 >> LHC: mLAD stent ok, dLAD 70, OM1 CTO, OM2 stent ok, dOM2 90, OM3 30-40, dLCx 90, p-mRCA stent ok, mRCA stent ok w/ 60-70 ISR, EF 50% >> med Rx // MV 1/17: no ischemia // Cath ([facility]) 05/2019: RCA 100 (CTO)    Combined systolic and diastolic CHF    a. Myoview 1/17: EF 35%  //  b. Echo 1/17 EF 45-50% // Echo 05/2019: EF 50-55 // Echocardiogram 8/21: EF 50, inf AK, post and mid inf HK, mod LVH, RVSP 25.3,  mild to mod LAE, trivial MR    Depression    Diabetes mellitus ([place])    a. A1c 8.8 08/2012->Metformin initiated. => b. A1c (9/14): 6.6   Gastroesophageal reflux disease    History of nuclear stress test    a. Myoview 1/17: EF 35%, fixed inferior lateral defect consistent with infarct, no ischemia, intermediate risk   History of pneumonia    HTN (hypertension)    Hx of NSTEMI    Hyperlipidemia    Ischemic cardiomyopathy    a. EF 40%; improved to normal;  b. 08/2012 Echo: EF 50-55%, mod LVH.//  c. Echo 9/16: Inf HK, mild LVH, EF 55%, mild LAE, normal RVF, mild RAE, PASP 35 mmHg  //  d. Echo 1/17: EF 45-50%, inferior HK, mild BAE, PASP 33 mmHg   Obesity    OSA (obstructive sleep apnea)    Does not use CPAP as of 05/2011   Tobacco abuse     Past Surgical History:  Procedure Laterality Date   CORONARY ANGIOPLASTY WITH STENT PLACEMENT     CORONARY STENT PLACEMENT  2009   LEFT HEART CATHETERIZATION WITH CORONARY ANGIOGRAM N/A 08/31/2012   Procedure: LEFT HEART CATHETERIZATION WITH CORONARY ANGIOGRAM;  Surgeon: Burnell Blanks, MD;  Location: [facility] CATH LAB;  Service: Cardiovascular;  Laterality: N/A;   LEFT HEART CATHETERIZATION WITH CORONARY ANGIOGRAM N/A 11/16/2013   Procedure: LEFT HEART CATHETERIZATION WITH CORONARY ANGIOGRAM;  Surgeon: Blane Ohara, MD;  Location: [facility] CATH LAB;  Service: Cardiovascular;  Laterality: N/A;   LEFT HEART CATHETERIZATION WITH CORONARY ANGIOGRAM N/A 03/15/2014   Procedure: LEFT HEART CATHETERIZATION WITH CORONARY ANGIOGRAM;  Surgeon: Peter M Martinique, MD;  Location: [facility] CATH LAB;  Service: Cardiovascular;  Laterality: N/A;   PERCUTANEOUS CORONARY STENT INTERVENTION (PCI-S) Right 08/31/2012   Procedure: PERCUTANEOUS CORONARY STENT INTERVENTION (PCI-S);  Surgeon: Burnell Blanks, MD;  Location: [facility] CATH LAB;  Service: Cardiovascular;  Laterality: Right;   PERCUTANEOUS CORONARY STENT INTERVENTION (PCI-S)  11/16/2013   Procedure: PERCUTANEOUS CORONARY STENT  INTERVENTION (PCI-S);  Surgeon: Blane Ohara, MD;  Location: [facility] CATH LAB;  Service: Cardiovascular;;     Inpatient Medications: Scheduled Meds:  aspirin  81 mg Oral Daily   [START ON 02/07/2021] atorvastatin  80 mg Oral QHS   carvedilol  25 mg Oral BID WC   clopidogrel  225 mg Oral Once   clopidogrel  75 mg Oral Daily   docusate sodium  100 mg Oral Daily   empagliflozin  10 mg Oral QAC breakfast   enoxaparin (LOVENOX) injection  70 mg Subcutaneous Q24H   gabapentin  300 mg Oral TID   insulin aspart  0-9 Units Subcutaneous TID WC   isosorbide mononitrate  90 mg Oral Daily   pantoprazole  40 mg Oral Daily   PARoxetine  20 mg Oral Daily   vitamin B-12  1,000 mcg Oral Daily  Continuous Infusions:  sodium chloride     PRN Meds: acetaminophen **OR** acetaminophen (TYLENOL) oral liquid 160 mg/5 mL **OR** acetaminophen, nitroGLYCERIN, senna-docusate, traMADol, witch hazel-glycerin  Allergies:    Allergies  Allergen Reactions   Penicillin G Other (See Comments)    Did it involve swelling of the face/tongue/throat, SOB, or low BP?  N/A Did it involve sudden or severe rash/hives, skin peeling, or any reaction on the inside of your mouth or nose? N/A Did you need to seek medical attention at a hospital or doctor's office? N/A When did it last happen? Child If all above answers are "NO", may proceed with cephalosporin use.   Penicillins Other (See Comments)    Did it involve swelling of the face/tongue/throat, SOB, or low BP? N/A Did it involve sudden or severe rash/hives, skin peeling, or any reaction on the inside of your mouth or nose?N/A Did you need to seek medical attention at a hospital or doctor's office? N/A When did it last happen? Child      If all above answers are "NO", may proceed with cephalosporin use.    Social History:   Social History   Socioeconomic History   Marital status: Single    Spouse name: Not on file   Number of children: 0   Years of education:  Not on file   Highest education level: Not on file  Occupational History   Occupation: retired  Tobacco Use   Smoking status: Former    Packs/day: 0.50    Years: 30.00    Pack years: 15.00    Types: Cigarettes   Smokeless tobacco: Never   Tobacco comments:    trying to cut down  Vaping Use   Vaping Use: Never used  Substance and Sexual Activity   Alcohol use: No   Drug use: No    Comment: remote marijuana use   Sexual activity: Not Currently  Other Topics Concern   Not on file  Social History Narrative   Lives alone.  He has no children.   He retired early due to health problems.         Social Determinants of Health   Financial Resource Strain: Low Risk    Difficulty of Paying Living Expenses: Not hard at all  Food Insecurity: Food Insecurity Present   Worried About Charity fundraiser in the Last Year: Sometimes true   Arboriculturist in the Last Year: Sometimes true  Transportation Needs: No Transportation Needs   Lack of Transportation (Medical): No   Lack of Transportation (Non-Medical): No  Physical Activity: Inactive   Days of Exercise per Week: 0 days   Minutes of Exercise per Session: 0 min  Stress: No Stress Concern Present   Feeling of Stress : Not at all  Social Connections: Not on file  Intimate Partner Violence: Not on file    Family History:    Family History  Problem Relation Age of Onset   Depression Mother    Cancer Mother        ovarian   Hypertension Mother        Died, 25   Other Father        motor vehicle accident   Coronary artery disease Other    Heart attack Neg Hx    Stroke Neg Hx      ROS:  Please see the history of present illness.  All other ROS reviewed and negative.     Physical Exam/Data:   Vitals:  02/06/21 1032 02/06/21 1045 02/06/21 1133 02/06/21 1513  BP:  101/66 (!) 104/92 122/69  Pulse:  61 63 (!) 58  Resp:  (!) '26 18 18  '$ Temp: 98.5 F (36.9 C)  97.8 F (36.6 C) 97.9 F (36.6 C)  TempSrc: Oral  Oral  Oral  SpO2:  93% 94% 97%  Weight:      Height:        Intake/Output Summary (Last 24 hours) at 02/06/2021 1623 Last data filed at 02/06/2021 1200 Gross per 24 hour  Intake 240 ml  Output --  Net 240 ml   Last 3 Weights 02/06/2021 02/02/2021 01/18/2021  Weight (lbs) 318 lb 318 lb 318 lb 12.8 oz  Weight (kg) 144.244 kg 144.244 kg 144.607 kg     Body mass index is 41.96 kg/m.  General:  Well nourished, well developed, in no acute distress HEENT: normal Lymph: no adenopathy Neck: no JVD Endocrine:  No thryomegaly Vascular: No carotid bruits; FA pulses 2+ bilaterally without bruits  Cardiac:  normal S1, S2; RRR; no murmur  Lungs:  clear to auscultation bilaterally, no wheezing, rhonchi or rales  Abd: soft, nontender, no hepatomegaly  Ext: no edema Musculoskeletal:  No deformities, BUE and BLE strength normal and equal Skin: warm and dry  Neuro:  CNs 2-12 intact, no focal abnormalities noted Psych:  Normal affect   EKG:  The EKG was personally reviewed and demonstrates: Rhythm, chronic right bundle branch block Telemetry:  Telemetry was personally reviewed and demonstrates: Sinus rhythm  Relevant CV Studies:  Stress test 06/2015 Nuclear stress EF: 35%. There was no ST segment deviation noted during stress. This is an intermediate risk study. Findings consistent with prior myocardial infarction. The left ventricular ejection fraction is moderately decreased (30-44%).   Intermediate risk stress nuclear study with a large, severe, predominantly fixed inferior lateral defect consistent with prior infarct; no significant ischemia; EF 35 but visually appears better; akinesis of the basal and mid inferior lateral wall; mild LVE; suggest echo to better assess LV function. Study intermediate risk due to reduced LV function.    Laboratory Data:  High Sensitivity Troponin:   Recent Labs  Lab 02/02/21 0827 02/05/21 1023 02/05/21 1225  TROPONINIHS 42* 40* 37*     Chemistry Recent  Labs  Lab 02/02/21 0827 02/05/21 1023 02/05/21 1059 02/06/21 0638  NA 139 138 139 139  K 4.0 3.7 4.1 3.4*  CL 105 103 108 105  CO2 24 25  --  24  GLUCOSE 154* 137* 136* 156*  BUN 24* 22 29* 23  CREATININE 1.35* 1.61* 1.50* 1.42*  CALCIUM 9.4 9.7  --  9.3  GFRNONAA 59* 48*  --  56*  ANIONGAP 10 10  --  10    Recent Labs  Lab 02/02/21 0827 02/05/21 1023  PROT 7.0 7.6  ALBUMIN 3.7 3.9  AST 26 20  ALT 21 22  ALKPHOS 61 59  BILITOT 0.8 0.9   Hematology Recent Labs  Lab 02/02/21 0827 02/05/21 1023 02/05/21 1059 02/06/21 0638  WBC 7.6 7.1  --  6.2  RBC 5.38 5.72  --  5.56  HGB 15.9 16.7 18.0* 16.1  HCT 48.3 52.1* 53.0* 50.3  MCV 89.8 91.1  --  90.5  MCH 29.6 29.2  --  29.0  MCHC 32.9 32.1  --  32.0  RDW 14.5 14.5  --  14.6  PLT 229 244  --  265   BNP Recent Labs  Lab 02/02/21 0827  BNP 178.9*  DDimer No results for input(s): DDIMER in the last 168 hours.   Radiology/Studies:  CT ANGIO HEAD W OR WO CONTRAST  Result Date: 02/05/2021 CLINICAL DATA:  Transient ischemic attack EXAM: CT ANGIOGRAPHY HEAD AND NECK TECHNIQUE: Multidetector CT imaging of the head and neck was performed using the standard protocol during bolus administration of intravenous contrast. Multiplanar CT image reconstructions and MIPs were obtained to evaluate the vascular anatomy. Carotid stenosis measurements (when applicable) are obtained utilizing NASCET criteria, using the distal internal carotid diameter as the denominator. CONTRAST:  28m OMNIPAQUE IOHEXOL 350 MG/ML SOLN COMPARISON:  None. FINDINGS: CTA NECK FINDINGS SKELETON: There is no bony spinal canal stenosis. No lytic or blastic lesion. OTHER NECK: Normal pharynx, larynx and major salivary glands. No cervical lymphadenopathy. Unremarkable thyroid gland. UPPER CHEST: No pneumothorax or pleural effusion. No nodules or masses. AORTIC ARCH: There is no calcific atherosclerosis of the aortic arch. There is no aneurysm, dissection or  hemodynamically significant stenosis of the visualized portion of the aorta. Conventional 3 vessel aortic branching pattern. The visualized proximal subclavian arteries are widely patent. RIGHT CAROTID SYSTEM: Normal without aneurysm, dissection or stenosis. LEFT CAROTID SYSTEM: Normal without aneurysm, dissection or stenosis. VERTEBRAL ARTERIES: Left dominant configuration. Both origins are clearly patent. There is no dissection, occlusion or flow-limiting stenosis to the skull base (V1-V3 segments). CTA HEAD FINDINGS POSTERIOR CIRCULATION: --Vertebral arteries: Normal V4 segments. --Inferior cerebellar arteries: Normal. --Basilar artery: Normal. --Superior cerebellar arteries: Normal. --Posterior cerebral arteries (PCA): Normal. ANTERIOR CIRCULATION: --Intracranial internal carotid arteries: Normal. --Anterior cerebral arteries (ACA): Normal. Both A1 segments are present. Patent anterior communicating artery (a-comm). --Middle cerebral arteries (MCA): Normal. VENOUS SINUSES: As permitted by contrast timing, patent. ANATOMIC VARIANTS: None Review of the MIP images confirms the above findings. IMPRESSION: Normal CTA of the head and neck. Electronically Signed   By: KUlyses JarredM.D.   On: 02/05/2021 21:58   DG Chest 1 View  Result Date: 02/05/2021 CLINICAL DATA:  Dizziness, shortness of breath EXAM: CHEST  1 VIEW COMPARISON:  Prior chest x-ray 02/02/2021 FINDINGS: Stable cardiac and mediastinal contours. Trace linear atelectasis versus scarring in the right middle lobe. Mild vascular congestion without edema. No focal airspace infiltrate, pleural effusion or pneumothorax. Stable mild bronchitic changes. No lytic or blastic osseous lesion. IMPRESSION: No active disease. Electronically Signed   By: HJacqulynn CadetM.D.   On: 02/05/2021 11:33   CT HEAD WO CONTRAST  Result Date: 02/05/2021 CLINICAL DATA:  Dizziness with ambulation, generalized weakness. EXAM: CT HEAD WITHOUT CONTRAST TECHNIQUE: Contiguous  axial images were obtained from the base of the skull through the vertex without intravenous contrast. COMPARISON:  Head CT dated 09/15/2011. FINDINGS: Brain: Ventricles are within normal limits in size and configuration. Mild chronic small vessel ischemic changes within the bilateral periventricular and subcortical white matter regions. No mass, hemorrhage, edema or other evidence of acute parenchymal abnormality. No extra-axial hemorrhage. Vascular: Chronic calcified atherosclerotic changes of the large vessels at the skull base. No unexpected hyperdense vessel. Skull: Normal. Negative for fracture or focal lesion. Sinuses/Orbits: No acute finding. Other: None. IMPRESSION: 1. No acute findings. No intracranial mass, hemorrhage or edema. 2. Mild chronic small vessel ischemic changes in the white matter. Electronically Signed   By: SFranki CabotM.D.   On: 02/05/2021 11:15   CT ANGIO NECK W OR WO CONTRAST  Result Date: 02/05/2021 CLINICAL DATA:  Transient ischemic attack EXAM: CT ANGIOGRAPHY HEAD AND NECK TECHNIQUE: Multidetector CT imaging of the head and neck was performed  using the standard protocol during bolus administration of intravenous contrast. Multiplanar CT image reconstructions and MIPs were obtained to evaluate the vascular anatomy. Carotid stenosis measurements (when applicable) are obtained utilizing NASCET criteria, using the distal internal carotid diameter as the denominator. CONTRAST:  66m OMNIPAQUE IOHEXOL 350 MG/ML SOLN COMPARISON:  None. FINDINGS: CTA NECK FINDINGS SKELETON: There is no bony spinal canal stenosis. No lytic or blastic lesion. OTHER NECK: Normal pharynx, larynx and major salivary glands. No cervical lymphadenopathy. Unremarkable thyroid gland. UPPER CHEST: No pneumothorax or pleural effusion. No nodules or masses. AORTIC ARCH: There is no calcific atherosclerosis of the aortic arch. There is no aneurysm, dissection or hemodynamically significant stenosis of the visualized  portion of the aorta. Conventional 3 vessel aortic branching pattern. The visualized proximal subclavian arteries are widely patent. RIGHT CAROTID SYSTEM: Normal without aneurysm, dissection or stenosis. LEFT CAROTID SYSTEM: Normal without aneurysm, dissection or stenosis. VERTEBRAL ARTERIES: Left dominant configuration. Both origins are clearly patent. There is no dissection, occlusion or flow-limiting stenosis to the skull base (V1-V3 segments). CTA HEAD FINDINGS POSTERIOR CIRCULATION: --Vertebral arteries: Normal V4 segments. --Inferior cerebellar arteries: Normal. --Basilar artery: Normal. --Superior cerebellar arteries: Normal. --Posterior cerebral arteries (PCA): Normal. ANTERIOR CIRCULATION: --Intracranial internal carotid arteries: Normal. --Anterior cerebral arteries (ACA): Normal. Both A1 segments are present. Patent anterior communicating artery (a-comm). --Middle cerebral arteries (MCA): Normal. VENOUS SINUSES: As permitted by contrast timing, patent. ANATOMIC VARIANTS: None Review of the MIP images confirms the above findings. IMPRESSION: Normal CTA of the head and neck. Electronically Signed   By: KUlyses JarredM.D.   On: 02/05/2021 21:58   MR BRAIN WO CONTRAST  Result Date: 02/05/2021 CLINICAL DATA:  Vertigo, central. Dizziness. Fall last night. Generalized weakness. Resolved speech disturbance. EXAM: MRI HEAD WITHOUT CONTRAST TECHNIQUE: Multiplanar, multiecho pulse sequences of the brain and surrounding structures were obtained without intravenous contrast. COMPARISON:  Head CT 02/05/2021 FINDINGS: Brain: There is no evidence of an acute infarct, intracranial hemorrhage, mass, midline shift, or extra-axial fluid collection. T2 hyperintensities in the cerebral white matter bilaterally are nonspecific but compatible with moderately age advanced chronic small vessel ischemic disease. There is a small chronic left cerebellar infarct. Mild cerebral atrophy is within normal limits for age. Vascular:  Major intracranial vascular flow voids are preserved. Skull and upper cervical spine: Unremarkable bone marrow signal. Sinuses/Orbits: Unremarkable orbits. Mild mucosal thickening in the ethmoid and maxillary sinuses. Clear mastoid air cells. Other: None. IMPRESSION: 1. No acute intracranial abnormality. 2. Moderate chronic small vessel ischemic disease. 3. Small chronic left cerebellar infarct. Electronically Signed   By: ALogan BoresM.D.   On: 02/05/2021 18:33   ECHOCARDIOGRAM COMPLETE  Result Date: 02/06/2021    ECHOCARDIOGRAM REPORT   Patient Name:   SJAILINHOOD Date of Exam: 02/06/2021 Medical Rec #:  [id number]     Height:       73.0 in Accession #:    [id number]    Weight:       318.0 lb Date of Birth:  9January 04, 1957      BSA:          2.620 m Patient Age:    65years       BP:           130/92 mmHg Patient Gender: M              HR:           82 bpm. Exam Location:  Inpatient Procedure: 2D Echo, Cardiac  Doppler and Color Doppler Indications:    CVA  History:        Patient has prior history of Echocardiogram examinations, most                 recent 01/14/2020. CHF, CAD and Previous Myocardial Infarction;                 Risk Factors:Hypertension, Diabetes, Dyslipidemia and Former                 Smoker. 05/14/2019 left heart cath.  Sonographer:    Furman Referring Phys: [id number] [place]  1. Left ventricular ejection fraction, by estimation, is 45 to 50%. The left ventricle has mildly decreased function. The left ventricle demonstrates global hypokinesis. There is moderate left ventricular hypertrophy. Left ventricular diastolic parameters are consistent with Grade I diastolic dysfunction (impaired relaxation).  2. Right ventricular systolic function is normal. The right ventricular size is normal. There is normal pulmonary artery systolic pressure. The estimated right ventricular systolic pressure is 0000000 mmHg.  3. Left atrial size was mildly dilated.  4. The mitral valve  is normal in structure. No evidence of mitral valve regurgitation. No evidence of mitral stenosis.  5. The aortic valve is tricuspid. Aortic valve regurgitation is trivial. No aortic stenosis is present.  6. Aortic dilatation noted. There is mild dilatation of the aortic root, measuring 37 mm.  7. The inferior vena cava is normal in size with greater than 50% respiratory variability, suggesting right atrial pressure of 3 mmHg. FINDINGS  Left Ventricle: Left ventricular ejection fraction, by estimation, is 45 to 50%. The left ventricle has mildly decreased function. The left ventricle demonstrates global hypokinesis. The left ventricular internal cavity size was normal in size. There is  moderate left ventricular hypertrophy. Left ventricular diastolic parameters are consistent with Grade I diastolic dysfunction (impaired relaxation). Right Ventricle: The right ventricular size is normal. No increase in right ventricular wall thickness. Right ventricular systolic function is normal. There is normal pulmonary artery systolic pressure. The tricuspid regurgitant velocity is 1.74 m/s, and  with an assumed right atrial pressure of 3 mmHg, the estimated right ventricular systolic pressure is 0000000 mmHg. Left Atrium: Left atrial size was mildly dilated. Right Atrium: Right atrial size was normal in size. Pericardium: There is no evidence of pericardial effusion. Mitral Valve: The mitral valve is normal in structure. There is mild calcification of the mitral valve leaflet(s). Mild mitral annular calcification. No evidence of mitral valve regurgitation. No evidence of mitral valve stenosis. Tricuspid Valve: The tricuspid valve is normal in structure. Tricuspid valve regurgitation is trivial. Aortic Valve: The aortic valve is tricuspid. Aortic valve regurgitation is trivial. No aortic stenosis is present. Aortic valve mean gradient measures 6.0 mmHg. Aortic valve peak gradient measures 10.1 mmHg. Aortic valve area, by VTI  measures 3.64 cm. Pulmonic Valve: The pulmonic valve was normal in structure. Pulmonic valve regurgitation is not visualized. Aorta: Aortic dilatation noted. There is mild dilatation of the aortic root, measuring 37 mm. Venous: The inferior vena cava is normal in size with greater than 50% respiratory variability, suggesting right atrial pressure of 3 mmHg. IAS/Shunts: No atrial level shunt detected by color flow Doppler.  LEFT VENTRICLE PLAX 2D LVIDd:         4.40 cm      Diastology LVIDs:         4.00 cm      LV e' medial:    3.94 cm/s  LV PW:         2.30 cm      LV E/e' medial:  11.8 LV IVS:        1.70 cm      LV e' lateral:   7.62 cm/s LVOT diam:     2.50 cm      LV E/e' lateral: 6.1 LV SV:         105 LV SV Index:   40 LVOT Area:     4.91 cm  LV Volumes (MOD) LV vol d, MOD A2C: 106.0 ml LV vol d, MOD A4C: 174.0 ml LV vol s, MOD A2C: 59.2 ml LV vol s, MOD A4C: 76.9 ml LV SV MOD A2C:     46.8 ml LV SV MOD A4C:     174.0 ml LV SV MOD BP:      71.3 ml RIGHT VENTRICLE RV Basal diam:  4.10 cm RV Mid diam:    2.40 cm RV S prime:     11.50 cm/s TAPSE (M-mode): 2.9 cm LEFT ATRIUM             Index       RIGHT ATRIUM           Index LA Vol (A2C):   64.7 ml 24.69 ml/m RA Area:     17.90 cm LA Vol (A4C):   77.8 ml 29.69 ml/m RA Volume:   46.50 ml  17.75 ml/m LA Biplane Vol: 71.9 ml 27.44 ml/m  AORTIC VALVE                    PULMONIC VALVE AV Area (Vmax):    3.86 cm     PV Vmax:          1.00 m/s AV Area (Vmean):   3.60 cm     PV Vmean:         71.800 cm/s AV Area (VTI):     3.64 cm     PV VTI:           0.226 m AV Vmax:           159.00 cm/s  PV Peak grad:     4.0 mmHg AV Vmean:          112.000 cm/s PV Mean grad:     2.0 mmHg AV VTI:            0.287 m      PR End Diast Vel: 4.24 msec AV Peak Grad:      10.1 mmHg AV Mean Grad:      6.0 mmHg LVOT Vmax:         125.00 cm/s LVOT Vmean:        82.100 cm/s LVOT VTI:          0.213 m LVOT/AV VTI ratio: 0.74  AORTA Ao Root diam: 3.70 cm Ao Asc diam:  3.20 cm MITRAL  VALVE               TRICUSPID VALVE MV Area (PHT): 1.78 cm    TR Peak grad:   12.1 mmHg MV Decel Time: 426 msec    TR Vmax:        174.00 cm/s MV E velocity: 46.30 cm/s MV A velocity: 56.60 cm/s  SHUNTS MV E/A ratio:  0.82        Systemic VTI:  0.21 m  Systemic Diam: 2.50 cm Dalton McleanMD Electronically signed by Franki Monte Signature Date/Time: 02/06/2021/3:21:26 PM    Final      Assessment and Plan:   Chronic heart failure with preserved ejection fraction - Echocardiogram showed mildly reduced LV function at 45 to 50% , grade 1 diastolic dysfunction, moderate LVH, normal RV function.   -His EF was 50% by echocardiogram in August 2021 -Mild difference is likely readers variability -Patient is recently treated for volume exacerbation but currently euvolemic by exam.  Chest x-ray without evidence of pulmonary edema. -I have at length discussion regarding low-sodium diet and daily weight -Continue home carvedilol 25 mg twice daily, Jardiance 10 mg daily, spironolactone 12.5 mg daily and torsemide 20 mg twice daily.  Continue home dose of potassium supplement.  2.  Elevated troponin with history of CAD - Hs-troponin 40>>37. (Was 78 on 8/25 when seen in ER for CHF exacerbation). -Not in ACS pattern -Patient denies chest pain -No need for ischemic work-up -Continue home aspirin, Plavix, Imdur, statin and beta-blocker  3. CKD IIIa - Baseline Scr 1.3-1.5 - Scr of 1.42 today   4. Hypokalemia - Supplement given   5. TIA - Per primary   Dr. Audie Box to see.   Risk Assessment/Risk Scores:   New [employer] Heart Association (NYHA) Functional Class NYHA Class II      For questions or updates, please contact [facility] Please consult [email or web address] for contact info under    Jarrett Soho, [place]  02/06/2021 4:23 PM

## 2021-02-06 NOTE — Plan of Care (Signed)
Independently ambulates in room, and indep with adls

## 2021-02-06 NOTE — ED Notes (Signed)
Attempted to call report to floor [phone]

## 2021-02-06 NOTE — ED Notes (Signed)
Report given to Arva Chafe, RN of [phone]

## 2021-02-06 NOTE — Discharge Summary (Signed)
Physician Discharge Summary  [name] [name] ZJQ:[id number] DOB: 1956-02-09 DOA: 02/05/2021  PCP: Biagio Borg, MD  Admit date: 02/05/2021 Discharge date: 02/06/2021  Admitted From: Home Disposition:  Home   Recommendations for Outpatient Follow-up:  Follow up with PCP in 1-2 weeks Please obtain BMP/CBC in one week Needs follow up in BP, Diabetes. Risk factors modification.  Continue with aspirin Plavix   Home Health: None  Discharge Condition:Stable.  CODE STATUS: Full Code Diet recommendation: Heart Healthy / Carb Modified   Brief/Interim Summary: 65 year old with past medical history significant for prostatitis, anxiety, depression, B12 deficiency, Planter fasciitis, CAD status post stenting, CHF, CKD stage III, diabetes, hypertension, OSA who presents complaining of episode of dizziness with weakness and word finding difficulty.  His neurological symptoms has improved on presentation.   MRI of the brain was negative for acute stroke.  Patient was admitted for further evaluation of TIA.  1-TIA: -Patient presents with episode of dizziness, generalized weakness and word finding difficulty at.  Symptom has resolved. -CT head no acute abnormality.  MRI of the brain showed a small chronic left cerebellar infarct, no acute stroke. -Neurology consulted and following.  Awaiting recommendation -Continue with aspirin and statin --CTA head and neck: Normal CTA of head and neck. Hemoglobin A1c: 10.6 -LDL: 85 -Awaiting PT, OT evaluation. -orthostatic vitals negative -B 12 ;308 -Echo: EF 45 % -Neurology recommended DAPT (aspirin and plavix)  for  21  days, then plavix alone, but if patient needs aspirin for CAD ok to continue aspirin as well.    2-CAD status post multiple stents; Continue with aspirin, Plavix, statin, Coreg and Imdur. Patient ECHO with diffuse hypokinesis, previously focal hypokinesis. Ef 45 % -50% previously 50%. He report some chest pain while back. He has not been taking  aspirin. Troponin mildly elevated. I have consulted cardiologist .no further inpatient work up,  Continue with aspirin and plavix.   3-Chronic diastolic heart failure Blood pressure soft, hold torsemide and spironolactone Plan to resume diuretics tomorrow.  He reports that his  torsemide dose was increased on Friday and Saturday due to episode of SOB HF days  prior.   He was also a started on Jardiance. Compensated.  ECHO showed EF 45 %, cardiology consulted, plan to follow up out patient.    4-Diabetes type 2, uncontrolled, Hyperglycemia Continue with a sliding scale, continue with Jardiance We discuss about using glipizide at discharge as well. A1c: 10     5-CKD stage IIIa;  Creatinine  baseline 1.4--1.5 Received IV fluids in the setting of receiving contrast for his CT head and neck Hold diuretics. Plan to resume diuretic tomorrow.  Cr increase to 1.6.  down to 1.4.      Lumbar spinal stenosis, neuropathy: Continue with tramadol and gabapentin GERD: Continue with PPI   Hypertension: Adjust blood pressure medications   Hyperlipidemia: Continue with the statins Hypokalemia: Replete orally Class III obesity; life style modification.    Estimated body mass index is 41.96 kg/m as calculated from the following:   Height as of this encounter: 6' 1" (1.854 m).   Weight as of this encounter: 144.2 kg.      Discharge Diagnoses:  Principal Problem:   TIA (transient ischemic attack) Active Problems:   GASTROESOPHAGEAL REFLUX DISEASE   DM2 (diabetes mellitus, type 2) (HCC)   CAD S/P multiple PCI   Hyperlipidemia   Peripheral neuropathy (HCC)   Left lumbar radiculopathy   CKD (chronic kidney disease), stage III (HCC)   Chronic heart failure  with preserved ejection fraction (HFpEF) [facility])    Discharge Instructions  Discharge Instructions     Diet - low sodium heart healthy   Complete by: As directed    Increase activity slowly   Complete by: As directed        Allergies as of 02/06/2021       Reactions   Penicillin G Other (See Comments)   Did it involve swelling of the face/tongue/throat, SOB, or low BP?  N/A Did it involve sudden or severe rash/hives, skin peeling, or any reaction on the inside of your mouth or nose? N/A Did you need to seek medical attention at a hospital or doctor's office? N/A When did it last happen? Child If all above answers are "NO", may proceed with cephalosporin use.   Penicillins Other (See Comments)   Did it involve swelling of the face/tongue/throat, SOB, or low BP? N/A Did it involve sudden or severe rash/hives, skin peeling, or any reaction on the inside of your mouth or nose?N/A Did you need to seek medical attention at a hospital or doctor's office? N/A When did it last happen? Child      If all above answers are "NO", may proceed with cephalosporin use.        Medication List     STOP taking these medications    rosuvastatin 40 MG tablet Commonly known as: CRESTOR       TAKE these medications    aspirin 81 MG tablet Take 1 tablet (81 mg total) by mouth daily.   atorvastatin 80 MG tablet Commonly known as: LIPITOR Take 1 tablet (80 mg total) by mouth at bedtime. Start taking on: February 07, 2021   B-D SINGLE USE SWABS BUTTERFLY Pads Use up to 4 times a day to test blood sugars. Dx: E10.9, E11.9   blood glucose meter kit and supplies Dispense based on patient and insurance preference. Use up to four times daily as directed. (FOR ICD-10 E10.9, E11.9).   carvedilol 25 MG tablet Commonly known as: COREG Take 1 tablet (25 mg total) by mouth 2 (two) times daily with a meal.   cetirizine 10 MG tablet Commonly known as: ZYRTEC Take 10 mg by mouth daily.   clopidogrel 75 MG tablet Commonly known as: PLAVIX Take 1 tablet (75 mg total) by mouth daily.   empagliflozin 10 MG Tabs tablet Commonly known as: Jardiance Take 1 tablet (10 mg total) by mouth daily before breakfast.   gabapentin  300 MG capsule Commonly known as: NEURONTIN TAKE 1 CAPSULE(300 MG) BY MOUTH THREE TIMES DAILY What changed: See the new instructions.   glipiZIDE 5 MG tablet Commonly known as: GLUCOTROL Take 1 tablet (5 mg total) by mouth 2 (two) times daily.   hydrocortisone cream 1 % Apply to affected area 2 times daily   isosorbide mononitrate 30 MG 24 hr tablet Commonly known as: IMDUR TAKE 3 TABLETS(90 MG) BY MOUTH DAILY What changed: See the new instructions.   Lancets 33G Misc Use to test blood sugars up to 4 times a day. DX: E10.9, E11.9   nitroGLYCERIN 0.4 MG SL tablet Commonly known as: NITROSTAT PLACE 1 TABLET UNDER THE TONGUE EVERY 5 MINUTES AS NEEDED FOR CHEST PAIN What changed:  how much to take how to take this when to take this reasons to take this additional instructions   pantoprazole 40 MG tablet Commonly known as: PROTONIX Take 1 tablet (40 mg total) by mouth daily.   PARoxetine 20 MG tablet Commonly known as:  Paxil Take 1 tablet (20 mg total) by mouth daily.   potassium chloride 10 MEQ tablet Commonly known as: KLOR-CON Take 2 tablets (20 mEq total) by mouth 2 (two) times daily. What changed:  how much to take additional instructions   spironolactone 25 MG tablet Commonly known as: ALDACTONE Take 0.5 tablets (12.5 mg total) by mouth daily.   torsemide 20 MG tablet Commonly known as: DEMADEX Take 1 tablet (20 mg total) by mouth 2 (two) times daily.   traMADol 50 MG tablet Commonly known as: ULTRAM TAKE 1 TABLET(50 MG) BY MOUTH EVERY 6 HOURS AS NEEDED What changed: See the new instructions.   True Metrix Blood Glucose Test test strip Generic drug: glucose blood Use to test blood sugars up to 4 times a day. DX: E10.9, E.11.9   True Metrix Level 1 Low Soln Use as needed. Dx: E10.9, E11.9   vitamin B-12 1000 MCG tablet Commonly known as: CYANOCOBALAMIN Take 1 tablet (1,000 mcg total) by mouth daily.        Allergies  Allergen Reactions    Penicillin G Other (See Comments)    Did it involve swelling of the face/tongue/throat, SOB, or low BP?  N/A Did it involve sudden or severe rash/hives, skin peeling, or any reaction on the inside of your mouth or nose? N/A Did you need to seek medical attention at a hospital or doctor's office? N/A When did it last happen? Child If all above answers are "NO", may proceed with cephalosporin use.   Penicillins Other (See Comments)    Did it involve swelling of the face/tongue/throat, SOB, or low BP? N/A Did it involve sudden or severe rash/hives, skin peeling, or any reaction on the inside of your mouth or nose?N/A Did you need to seek medical attention at a hospital or doctor's office? N/A When did it last happen? Child      If all above answers are "NO", may proceed with cephalosporin use.    Consultations: Cardiology Neurology   Procedures/Studies: CT ANGIO HEAD W OR WO CONTRAST  Result Date: 02/05/2021 CLINICAL DATA:  Transient ischemic attack EXAM: CT ANGIOGRAPHY HEAD AND NECK TECHNIQUE: Multidetector CT imaging of the head and neck was performed using the standard protocol during bolus administration of intravenous contrast. Multiplanar CT image reconstructions and MIPs were obtained to evaluate the vascular anatomy. Carotid stenosis measurements (when applicable) are obtained utilizing NASCET criteria, using the distal internal carotid diameter as the denominator. CONTRAST:  34m OMNIPAQUE IOHEXOL 350 MG/ML SOLN COMPARISON:  None. FINDINGS: CTA NECK FINDINGS SKELETON: There is no bony spinal canal stenosis. No lytic or blastic lesion. OTHER NECK: Normal pharynx, larynx and major salivary glands. No cervical lymphadenopathy. Unremarkable thyroid gland. UPPER CHEST: No pneumothorax or pleural effusion. No nodules or masses. AORTIC ARCH: There is no calcific atherosclerosis of the aortic arch. There is no aneurysm, dissection or hemodynamically significant stenosis of the visualized portion  of the aorta. Conventional 3 vessel aortic branching pattern. The visualized proximal subclavian arteries are widely patent. RIGHT CAROTID SYSTEM: Normal without aneurysm, dissection or stenosis. LEFT CAROTID SYSTEM: Normal without aneurysm, dissection or stenosis. VERTEBRAL ARTERIES: Left dominant configuration. Both origins are clearly patent. There is no dissection, occlusion or flow-limiting stenosis to the skull base (V1-V3 segments). CTA HEAD FINDINGS POSTERIOR CIRCULATION: --Vertebral arteries: Normal V4 segments. --Inferior cerebellar arteries: Normal. --Basilar artery: Normal. --Superior cerebellar arteries: Normal. --Posterior cerebral arteries (PCA): Normal. ANTERIOR CIRCULATION: --Intracranial internal carotid arteries: Normal. --Anterior cerebral arteries (ACA): Normal. Both A1 segments are  present. Patent anterior communicating artery (a-comm). --Middle cerebral arteries (MCA): Normal. VENOUS SINUSES: As permitted by contrast timing, patent. ANATOMIC VARIANTS: None Review of the MIP images confirms the above findings. IMPRESSION: Normal CTA of the head and neck. Electronically Signed   By: Ulyses Jarred M.D.   On: 02/05/2021 21:58   DG Chest 1 View  Result Date: 02/05/2021 CLINICAL DATA:  Dizziness, shortness of breath EXAM: CHEST  1 VIEW COMPARISON:  Prior chest x-ray 02/02/2021 FINDINGS: Stable cardiac and mediastinal contours. Trace linear atelectasis versus scarring in the right middle lobe. Mild vascular congestion without edema. No focal airspace infiltrate, pleural effusion or pneumothorax. Stable mild bronchitic changes. No lytic or blastic osseous lesion. IMPRESSION: No active disease. Electronically Signed   By: Jacqulynn Cadet M.D.   On: 02/05/2021 11:33   DG Chest 2 View  Result Date: 02/02/2021 CLINICAL DATA:  Dyspnea EXAM: CHEST - 2 VIEW COMPARISON:  12/26/2020 FINDINGS: Borderline cardiomegaly. Mild central pulmonary vascular congestion. Bibasilar airspace opacity. IMPRESSION:  Mild cardiomegaly and pulmonary vascular congestion. Bibasilar airspace opacities, better seen on the lateral view may be due to pulmonary edema or pneumonia. Electronically Signed   By: Miachel Roux M.D.   On: 02/02/2021 09:06   CT HEAD WO CONTRAST  Result Date: 02/05/2021 CLINICAL DATA:  Dizziness with ambulation, generalized weakness. EXAM: CT HEAD WITHOUT CONTRAST TECHNIQUE: Contiguous axial images were obtained from the base of the skull through the vertex without intravenous contrast. COMPARISON:  Head CT dated 09/15/2011. FINDINGS: Brain: Ventricles are within normal limits in size and configuration. Mild chronic small vessel ischemic changes within the bilateral periventricular and subcortical white matter regions. No mass, hemorrhage, edema or other evidence of acute parenchymal abnormality. No extra-axial hemorrhage. Vascular: Chronic calcified atherosclerotic changes of the large vessels at the skull base. No unexpected hyperdense vessel. Skull: Normal. Negative for fracture or focal lesion. Sinuses/Orbits: No acute finding. Other: None. IMPRESSION: 1. No acute findings. No intracranial mass, hemorrhage or edema. 2. Mild chronic small vessel ischemic changes in the white matter. Electronically Signed   By: Franki Cabot M.D.   On: 02/05/2021 11:15   CT ANGIO NECK W OR WO CONTRAST  Result Date: 02/05/2021 CLINICAL DATA:  Transient ischemic attack EXAM: CT ANGIOGRAPHY HEAD AND NECK TECHNIQUE: Multidetector CT imaging of the head and neck was performed using the standard protocol during bolus administration of intravenous contrast. Multiplanar CT image reconstructions and MIPs were obtained to evaluate the vascular anatomy. Carotid stenosis measurements (when applicable) are obtained utilizing NASCET criteria, using the distal internal carotid diameter as the denominator. CONTRAST:  50m OMNIPAQUE IOHEXOL 350 MG/ML SOLN COMPARISON:  None. FINDINGS: CTA NECK FINDINGS SKELETON: There is no bony spinal  canal stenosis. No lytic or blastic lesion. OTHER NECK: Normal pharynx, larynx and major salivary glands. No cervical lymphadenopathy. Unremarkable thyroid gland. UPPER CHEST: No pneumothorax or pleural effusion. No nodules or masses. AORTIC ARCH: There is no calcific atherosclerosis of the aortic arch. There is no aneurysm, dissection or hemodynamically significant stenosis of the visualized portion of the aorta. Conventional 3 vessel aortic branching pattern. The visualized proximal subclavian arteries are widely patent. RIGHT CAROTID SYSTEM: Normal without aneurysm, dissection or stenosis. LEFT CAROTID SYSTEM: Normal without aneurysm, dissection or stenosis. VERTEBRAL ARTERIES: Left dominant configuration. Both origins are clearly patent. There is no dissection, occlusion or flow-limiting stenosis to the skull base (V1-V3 segments). CTA HEAD FINDINGS POSTERIOR CIRCULATION: --Vertebral arteries: Normal V4 segments. --Inferior cerebellar arteries: Normal. --Basilar artery: Normal. --Superior cerebellar arteries:  Normal. --Posterior cerebral arteries (PCA): Normal. ANTERIOR CIRCULATION: --Intracranial internal carotid arteries: Normal. --Anterior cerebral arteries (ACA): Normal. Both A1 segments are present. Patent anterior communicating artery (a-comm). --Middle cerebral arteries (MCA): Normal. VENOUS SINUSES: As permitted by contrast timing, patent. ANATOMIC VARIANTS: None Review of the MIP images confirms the above findings. IMPRESSION: Normal CTA of the head and neck. Electronically Signed   By: Ulyses Jarred M.D.   On: 02/05/2021 21:58   MR BRAIN WO CONTRAST  Result Date: 02/05/2021 CLINICAL DATA:  Vertigo, central. Dizziness. Fall last night. Generalized weakness. Resolved speech disturbance. EXAM: MRI HEAD WITHOUT CONTRAST TECHNIQUE: Multiplanar, multiecho pulse sequences of the brain and surrounding structures were obtained without intravenous contrast. COMPARISON:  Head CT 02/05/2021 FINDINGS: Brain:  There is no evidence of an acute infarct, intracranial hemorrhage, mass, midline shift, or extra-axial fluid collection. T2 hyperintensities in the cerebral white matter bilaterally are nonspecific but compatible with moderately age advanced chronic small vessel ischemic disease. There is a small chronic left cerebellar infarct. Mild cerebral atrophy is within normal limits for age. Vascular: Major intracranial vascular flow voids are preserved. Skull and upper cervical spine: Unremarkable bone marrow signal. Sinuses/Orbits: Unremarkable orbits. Mild mucosal thickening in the ethmoid and maxillary sinuses. Clear mastoid air cells. Other: None. IMPRESSION: 1. No acute intracranial abnormality. 2. Moderate chronic small vessel ischemic disease. 3. Small chronic left cerebellar infarct. Electronically Signed   By: Logan Bores M.D.   On: 02/05/2021 18:33   ECHOCARDIOGRAM COMPLETE  Result Date: 02/06/2021    ECHOCARDIOGRAM REPORT   Patient Name:   FYNN Wolgamott Date of Exam: 02/06/2021 Medical Rec #:  [id number]      Height:       73.0 in Accession #:    [id number]     Weight:       318.0 lb Date of Birth:  28-Dec-1955       BSA:          2.620 m Patient Age:    65 years       BP:           130/92 mmHg Patient Gender: M              HR:           82 bpm. Exam Location:  Inpatient Procedure: 2D Echo, Cardiac Doppler and Color Doppler Indications:    CVA  History:        Patient has prior history of Echocardiogram examinations, most                 recent 01/14/2020. CHF, CAD and Previous Myocardial Infarction;                 Risk Factors:Hypertension, Diabetes, Dyslipidemia and Former                 Smoker. 05/14/2019 left heart cath.  Sonographer:    [place] Referring Phys: [id number] Marvin  1. Left ventricular ejection fraction, by estimation, is 45 to 50%. The left ventricle has mildly decreased function. The left ventricle demonstrates global hypokinesis. There is moderate left  ventricular hypertrophy. Left ventricular diastolic parameters are consistent with Grade I diastolic dysfunction (impaired relaxation).  2. Right ventricular systolic function is normal. The right ventricular size is normal. There is normal pulmonary artery systolic pressure. The estimated right ventricular systolic pressure is 69.4 mmHg.  3. Left atrial size was mildly dilated.  4. The mitral valve is normal in structure.  No evidence of mitral valve regurgitation. No evidence of mitral stenosis.  5. The aortic valve is tricuspid. Aortic valve regurgitation is trivial. No aortic stenosis is present.  6. Aortic dilatation noted. There is mild dilatation of the aortic root, measuring 37 mm.  7. The inferior vena cava is normal in size with greater than 50% respiratory variability, suggesting right atrial pressure of 3 mmHg. FINDINGS  Left Ventricle: Left ventricular ejection fraction, by estimation, is 45 to 50%. The left ventricle has mildly decreased function. The left ventricle demonstrates global hypokinesis. The left ventricular internal cavity size was normal in size. There is  moderate left ventricular hypertrophy. Left ventricular diastolic parameters are consistent with Grade I diastolic dysfunction (impaired relaxation). Right Ventricle: The right ventricular size is normal. No increase in right ventricular wall thickness. Right ventricular systolic function is normal. There is normal pulmonary artery systolic pressure. The tricuspid regurgitant velocity is 1.74 m/s, and  with an assumed right atrial pressure of 3 mmHg, the estimated right ventricular systolic pressure is 22.9 mmHg. Left Atrium: Left atrial size was mildly dilated. Right Atrium: Right atrial size was normal in size. Pericardium: There is no evidence of pericardial effusion. Mitral Valve: The mitral valve is normal in structure. There is mild calcification of the mitral valve leaflet(s). Mild mitral annular calcification. No evidence of  mitral valve regurgitation. No evidence of mitral valve stenosis. Tricuspid Valve: The tricuspid valve is normal in structure. Tricuspid valve regurgitation is trivial. Aortic Valve: The aortic valve is tricuspid. Aortic valve regurgitation is trivial. No aortic stenosis is present. Aortic valve mean gradient measures 6.0 mmHg. Aortic valve peak gradient measures 10.1 mmHg. Aortic valve area, by VTI measures 3.64 cm. Pulmonic Valve: The pulmonic valve was normal in structure. Pulmonic valve regurgitation is not visualized. Aorta: Aortic dilatation noted. There is mild dilatation of the aortic root, measuring 37 mm. Venous: The inferior vena cava is normal in size with greater than 50% respiratory variability, suggesting right atrial pressure of 3 mmHg. IAS/Shunts: No atrial level shunt detected by color flow Doppler.  LEFT VENTRICLE PLAX 2D LVIDd:         4.40 cm      Diastology LVIDs:         4.00 cm      LV e' medial:    3.94 cm/s LV PW:         2.30 cm      LV E/e' medial:  11.8 LV IVS:        1.70 cm      LV e' lateral:   7.62 cm/s LVOT diam:     2.50 cm      LV E/e' lateral: 6.1 LV SV:         105 LV SV Index:   40 LVOT Area:     4.91 cm  LV Volumes (MOD) LV vol d, MOD A2C: 106.0 ml LV vol d, MOD A4C: 174.0 ml LV vol s, MOD A2C: 59.2 ml LV vol s, MOD A4C: 76.9 ml LV SV MOD A2C:     46.8 ml LV SV MOD A4C:     174.0 ml LV SV MOD BP:      71.3 ml RIGHT VENTRICLE RV Basal diam:  4.10 cm RV Mid diam:    2.40 cm RV S prime:     11.50 cm/s TAPSE (M-mode): 2.9 cm LEFT ATRIUM             Index  RIGHT ATRIUM           Index LA Vol (A2C):   64.7 ml 24.69 ml/m RA Area:     17.90 cm LA Vol (A4C):   77.8 ml 29.69 ml/m RA Volume:   46.50 ml  17.75 ml/m LA Biplane Vol: 71.9 ml 27.44 ml/m  AORTIC VALVE                    PULMONIC VALVE AV Area (Vmax):    3.86 cm     PV Vmax:          1.00 m/s AV Area (Vmean):   3.60 cm     PV Vmean:         71.800 cm/s AV Area (VTI):     3.64 cm     PV VTI:           0.226 m AV  Vmax:           159.00 cm/s  PV Peak grad:     4.0 mmHg AV Vmean:          112.000 cm/s PV Mean grad:     2.0 mmHg AV VTI:            0.287 m      PR End Diast Vel: 4.24 msec AV Peak Grad:      10.1 mmHg AV Mean Grad:      6.0 mmHg LVOT Vmax:         125.00 cm/s LVOT Vmean:        82.100 cm/s LVOT VTI:          0.213 m LVOT/AV VTI ratio: 0.74  AORTA Ao Root diam: 3.70 cm Ao Asc diam:  3.20 cm MITRAL VALVE               TRICUSPID VALVE MV Area (PHT): 1.78 cm    TR Peak grad:   12.1 mmHg MV Decel Time: 426 msec    TR Vmax:        174.00 cm/s MV E velocity: 46.30 cm/s MV A velocity: 56.60 cm/s  SHUNTS MV E/A ratio:  0.82        Systemic VTI:  0.21 m                            Systemic Diam: 2.50 cm Dalton McleanMD Electronically signed by Franki Monte Signature Date/Time: 02/06/2021/3:21:26 PM    Final      Subjective: Dizziness has resolved. He denies chest pain.   Discharge Exam: Vitals:   02/06/21 1133 02/06/21 1513  BP: (!) 104/92 122/69  Pulse: 63 (!) 58  Resp: 18 18  Temp: 97.8 F (36.6 C) 97.9 F (36.6 C)  SpO2: 94% 97%     General: Pt is alert, awake, not in acute distress Cardiovascular: RRR, S1/S2 +, no rubs, no gallops Respiratory: CTA bilaterally, no wheezing, no rhonchi Abdominal: Soft, NT, ND, bowel sounds + Extremities: no edema, no cyanosis    The results of significant diagnostics from this hospitalization (including imaging, microbiology, ancillary and laboratory) are listed below for reference.     Microbiology: Recent Results (from the past 240 hour(s))  Resp Panel by RT-PCR (Flu A&B, Covid) Nasopharyngeal Swab     Status: None   Collection Time: 02/05/21 10:30 AM   Specimen: Nasopharyngeal Swab; Nasopharyngeal(NP) swabs in vial transport medium  Result Value Ref Range Status   SARS Coronavirus 2 by RT  PCR NEGATIVE NEGATIVE Final    Comment: (NOTE) SARS-CoV-2 target nucleic acids are NOT DETECTED.  The SARS-CoV-2 RNA is generally detectable in upper  respiratory specimens during the acute phase of infection. The lowest concentration of SARS-CoV-2 viral copies this assay can detect is 138 copies/mL. A negative result does not preclude SARS-Cov-2 infection and should not be used as the sole basis for treatment or other patient management decisions. A negative result may occur with  improper specimen collection/handling, submission of specimen other than nasopharyngeal swab, presence of viral mutation(s) within the areas targeted by this assay, and inadequate number of viral copies(<138 copies/mL). A negative result must be combined with clinical observations, patient history, and epidemiological information. The expected result is Negative.  Fact Sheet for Patients:  EntrepreneurPulse.com.au  Fact Sheet for Healthcare Providers:  IncredibleEmployment.be  This test is no t yet approved or cleared by the [place] FDA and  has been authorized for detection and/or diagnosis of SARS-CoV-2 by FDA under an Emergency Use Authorization (EUA). This EUA will remain  in effect (meaning this test can be used) for the duration of the COVID-19 declaration under Section 564(b)(1) of the Act, 21 U.S.C.section 360bbb-3(b)(1), unless the authorization is terminated  or revoked sooner.       Influenza A by PCR NEGATIVE NEGATIVE Final   Influenza B by PCR NEGATIVE NEGATIVE Final    Comment: (NOTE) The Xpert Xpress SARS-CoV-2/FLU/RSV plus assay is intended as an aid in the diagnosis of influenza from Nasopharyngeal swab specimens and should not be used as a sole basis for treatment. Nasal washings and aspirates are unacceptable for Xpert Xpress SARS-CoV-2/FLU/RSV testing.  Fact Sheet for Patients: EntrepreneurPulse.com.au  Fact Sheet for Healthcare Providers: IncredibleEmployment.be  This test is not yet approved or cleared by the [place] FDA and has been  authorized for detection and/or diagnosis of SARS-CoV-2 by FDA under an Emergency Use Authorization (EUA). This EUA will remain in effect (meaning this test can be used) for the duration of the COVID-19 declaration under Section 564(b)(1) of the Act, 21 U.S.C. section 360bbb-3(b)(1), unless the authorization is terminated or revoked.  Performed at [facility], [place] [address], [place], [place] [address]      Labs: BNP (last 3 results) Recent Labs    02/02/21 0827  BNP 149.7*   Basic Metabolic Panel: Recent Labs  Lab 02/02/21 0827 02/05/21 1023 02/05/21 1059 02/06/21 0638  NA 139 138 139 139  K 4.0 3.7 4.1 3.4*  CL 105 103 108 105  CO2 24 25  --  24  GLUCOSE 154* 137* 136* 156*  BUN 24* 22 29* 23  CREATININE 1.35* 1.61* 1.50* 1.42*  CALCIUM 9.4 9.7  --  9.3   Liver Function Tests: Recent Labs  Lab 02/02/21 0827 02/05/21 1023  AST 26 20  ALT 21 22  ALKPHOS 61 59  BILITOT 0.8 0.9  PROT 7.0 7.6  ALBUMIN 3.7 3.9   No results for input(s): LIPASE, AMYLASE in the last 168 hours. No results for input(s): AMMONIA in the last 168 hours. CBC: Recent Labs  Lab 02/02/21 0827 02/05/21 1023 02/05/21 1059 02/06/21 0638  WBC 7.6 7.1  --  6.2  NEUTROABS 4.3 2.8  --   --   HGB 15.9 16.7 18.0* 16.1  HCT 48.3 52.1* 53.0* 50.3  MCV 89.8 91.1  --  90.5  PLT 229 244  --  265   Cardiac Enzymes: No results for input(s): CKTOTAL, CKMB, CKMBINDEX, TROPONINI in the  last 168 hours. BNP: Invalid input(s): POCBNP CBG: Recent Labs  Lab 02/06/21 0821 02/06/21 1131 02/06/21 1648  GLUCAP 161* 223* 146*   D-Dimer No results for input(s): DDIMER in the last 72 hours. Hgb A1c Recent Labs    02/06/21 0323  HGBA1C 10.6*   Lipid Profile Recent Labs    02/06/21 0323  CHOL 140  HDL 38*  LDLCALC 85  TRIG 84  CHOLHDL 3.7   Thyroid function studies No results for input(s): TSH, T4TOTAL, T3FREE, THYROIDAB in the last 72 hours.  Invalid input(s): FREET3 Anemia  work up Recent Labs    02/06/21 0638  VITAMINB12 308   Urinalysis    Component Value Date/Time   COLORURINE YELLOW 02/05/2021 1929   APPEARANCEUR CLEAR 02/05/2021 1929   LABSPEC 1.018 02/05/2021 1929   PHURINE 5.0 02/05/2021 1929   GLUCOSEU >=500 (A) 02/05/2021 1929   GLUCOSEU NEGATIVE 06/23/2020 0849   HGBUR NEGATIVE 02/05/2021 1929   HGBUR negative 08/23/2009 0000   BILIRUBINUR NEGATIVE 02/05/2021 1929   KETONESUR NEGATIVE 02/05/2021 1929   PROTEINUR NEGATIVE 02/05/2021 1929   UROBILINOGEN 1.0 06/23/2020 0849   NITRITE NEGATIVE 02/05/2021 1929   LEUKOCYTESUR NEGATIVE 02/05/2021 1929   Sepsis Labs Invalid input(s): PROCALCITONIN,  WBC,  LACTICIDVEN Microbiology Recent Results (from the past 240 hour(s))  Resp Panel by RT-PCR (Flu A&B, Covid) Nasopharyngeal Swab     Status: None   Collection Time: 02/05/21 10:30 AM   Specimen: Nasopharyngeal Swab; Nasopharyngeal(NP) swabs in vial transport medium  Result Value Ref Range Status   SARS Coronavirus 2 by RT PCR NEGATIVE NEGATIVE Final    Comment: (NOTE) SARS-CoV-2 target nucleic acids are NOT DETECTED.  The SARS-CoV-2 RNA is generally detectable in upper respiratory specimens during the acute phase of infection. The lowest concentration of SARS-CoV-2 viral copies this assay can detect is 138 copies/mL. A negative result does not preclude SARS-Cov-2 infection and should not be used as the sole basis for treatment or other patient management decisions. A negative result may occur with  improper specimen collection/handling, submission of specimen other than nasopharyngeal swab, presence of viral mutation(s) within the areas targeted by this assay, and inadequate number of viral copies(<138 copies/mL). A negative result must be combined with clinical observations, patient history, and epidemiological information. The expected result is Negative.  Fact Sheet for Patients:  EntrepreneurPulse.com.au  Fact  Sheet for Healthcare Providers:  IncredibleEmployment.be  This test is no t yet approved or cleared by the [place] FDA and  has been authorized for detection and/or diagnosis of SARS-CoV-2 by FDA under an Emergency Use Authorization (EUA). This EUA will remain  in effect (meaning this test can be used) for the duration of the COVID-19 declaration under Section 564(b)(1) of the Act, 21 U.S.C.section 360bbb-3(b)(1), unless the authorization is terminated  or revoked sooner.       Influenza A by PCR NEGATIVE NEGATIVE Final   Influenza B by PCR NEGATIVE NEGATIVE Final    Comment: (NOTE) The Xpert Xpress SARS-CoV-2/FLU/RSV plus assay is intended as an aid in the diagnosis of influenza from Nasopharyngeal swab specimens and should not be used as a sole basis for treatment. Nasal washings and aspirates are unacceptable for Xpert Xpress SARS-CoV-2/FLU/RSV testing.  Fact Sheet for Patients: EntrepreneurPulse.com.au  Fact Sheet for Healthcare Providers: IncredibleEmployment.be  This test is not yet approved or cleared by the [place] FDA and has been authorized for detection and/or diagnosis of SARS-CoV-2 by FDA under an Emergency Use Authorization (EUA). This EUA will  remain in effect (meaning this test can be used) for the duration of the COVID-19 declaration under Section 564(b)(1) of the Act, 21 U.S.C. section 360bbb-3(b)(1), unless the authorization is terminated or revoked.  Performed at [facility], [place] [address], [place], [place] [address]      Time coordinating discharge: 40 minutes  SIGNED:   Elmarie Shiley, MD  [facility]

## 2021-02-06 NOTE — Progress Notes (Signed)
Physical Therapy Evaluation & Discharge Patient Details Name: [name] [name] MRN: [id number] DOB: [birth year] Today's Date: 02/06/2021   History of Present Illness  Pt is a 65yo male presenting [facility] ED on 8/28 with complaints of weakness, trouble word-finding, and a fall; likely secondary to a TIA. Imaging showed small chronic L cerebellar infarcts, however, no acute infarct. PMH: DM, CAD, CKD3, CHF, HTN, NSTEMI.  Clinical Impression  Pt presents with the impairments above and problems listed below. Supervision to min guard for all mobility tasks for safety, no physical assist required. Pt completed dynamic gait tasks, scoring 21, indicating low fall risk. Educated pt on BE FAST stroke signs and provided handout. Reinforced the importance of diet and exercise in general cardiovascular health and encouraged light walking program and AROM exercises upon discharge.  No further skilled PT needs; will sign off. Should needs change, please re-consult.    Follow Up Recommendations No PT follow up    Equipment Recommendations  None recommended by PT    Recommendations for Other Services       Precautions / Restrictions Precautions Precautions: None Restrictions Weight Bearing Restrictions: No      Mobility  Bed Mobility               General bed mobility comments: Pt standing at sink upon entry.    Transfers Overall transfer level: Needs assistance Equipment used: None Transfers: Sit to/from Stand Sit to Stand: Supervision         General transfer comment: Pt supervision for safety only  Ambulation/Gait Ambulation/Gait assistance: Supervision;Min guard Gait Distance (Feet): 300 Feet Assistive device: None Gait Pattern/deviations: WFL(Within Functional Limits) Gait velocity: decreased   General Gait Details: Pt ambulated with step-through pattern with no gait deviations noted. Completed dynamic gait index without marked impairment. Min guard to supervision for safety  only.  Stairs Stairs: Yes Stairs assistance: Supervision Stair Management: One rail Right;One rail Left;Alternating pattern;Forwards Number of Stairs: 6 General stair comments: Pt performed stair navigation well with supervision for safety; reported feeling some fatigue but otherwise asymptomatic.  Wheelchair Mobility    Modified Rankin (Stroke Patients Only)       Balance Overall balance assessment: Modified Independent                               Standardized Balance Assessment Standardized Balance Assessment : Dynamic Gait Index   Dynamic Gait Index Level Surface: Normal Change in Gait Speed: Normal Gait with Horizontal Head Turns: Normal Gait with Vertical Head Turns: Normal Gait and Pivot Turn: Mild Impairment Step Over Obstacle: Normal Step Around Obstacles: Mild Impairment Steps: Mild Impairment Total Score: 21       Pertinent Vitals/Pain Pain Assessment: No/denies pain    Home Living Family/patient expects to be discharged to:: Private residence Living Arrangements: Alone   Type of Home: House Home Access: Stairs to enter Entrance Stairs-Rails: Can reach both;Left;Right Entrance Stairs-Number of Steps: 6 Home Layout: One level Home Equipment: Cane - single point      Prior Function Level of Independence: Independent with assistive device(s)         Comments: Pt uses cane for ambulation for security.     Hand Dominance        Extremity/Trunk Assessment   Upper Extremity Assessment Upper Extremity Assessment: Overall WFL for tasks assessed RUE Deficits / Details: Strength grossly 5/5 RUE Sensation: WNL RUE Coordination: WNL LUE Deficits / Details: Strength grossly 5/5  LUE Sensation: WNL LUE Coordination: WNL    Lower Extremity Assessment Lower Extremity Assessment: RLE deficits/detail;LLE deficits/detail RLE Deficits / Details: Strength grossly 5/5 RLE Sensation: history of peripheral neuropathy RLE Coordination:  WNL LLE Deficits / Details: Strength grossly 5/5 LLE Sensation: history of peripheral neuropathy (Pt reports reduced sensation and neuropathy L > R) LLE Coordination: WNL    Cervical / Trunk Assessment Cervical / Trunk Assessment: Normal  Communication   Communication: No difficulties  Cognition Arousal/Alertness: Awake/alert Behavior During Therapy: WFL for tasks assessed/performed Overall Cognitive Status: No family/caregiver present to determine baseline cognitive functioning                                 General Comments: Pt AOx4, generally pleasant and jocular.      General Comments General comments (skin integrity, edema, etc.): Pt reports he had recent fall on the stairs secondary to poor footwear. Educated pt on importance of proper footwear especially outside.    Exercises     Assessment/Plan    PT Assessment Patent does not need any further PT services  PT Problem List         PT Treatment Interventions      PT Goals (Current goals can be found in the Care Plan section)  Acute Rehab PT Goals Patient Stated Goal: To improve health PT Goal Formulation: With patient Time For Goal Achievement: 02/20/21 Potential to Achieve Goals: Good    Frequency     Barriers to discharge        Co-evaluation               AM-PAC PT "6 Clicks" Mobility  Outcome Measure Help needed turning from your back to your side while in a flat bed without using bedrails?: None Help needed moving from lying on your back to sitting on the side of a flat bed without using bedrails?: None Help needed moving to and from a bed to a chair (including a wheelchair)?: A Little Help needed standing up from a chair using your arms (e.g., wheelchair or bedside chair)?: A Little Help needed to walk in hospital room?: A Little Help needed climbing 3-5 steps with a railing? : A Little 6 Click Score: 20    End of Session Equipment Utilized During Treatment: Gait  belt Activity Tolerance: Patient tolerated treatment well Patient left: in chair;with call bell/phone within reach Nurse Communication: Mobility status PT Visit Diagnosis: History of falling (Z91.81);Other symptoms and signs involving the nervous system (R29.898)    Time: [id number] PT Time Calculation (min) (ACUTE ONLY): 25 min   Charges:   PT Evaluation $PT Eval Low Complexity: 1 Low PT Treatments $Gait Training: 8-22 mins        Dawayne Cirri, SPT Dawayne Cirri 02/06/2021, 3:57 PM

## 2021-02-06 NOTE — Progress Notes (Signed)
OT Cancellation Note  Patient Details Name: [name] [name] MRN: [id number] DOB: [birth year]   Cancelled Treatment:    Reason Eval/Treat Not Completed: OT screened, no needs identified, will sign off  [place], OT/L   Acute OT Clinical Specialist Acute Rehabilitation Services Pager [phone] Office [phone]  02/06/2021, 2:05 PM

## 2021-02-06 NOTE — Progress Notes (Addendum)
PROGRESS NOTE    [name] [name]  [id number] DOB: 24-May-1956 DOA: 02/05/2021 PCP: Biagio Borg, MD   Brief Narrative: 65 year old with past medical history significant for prostatitis, anxiety, depression, B12 deficiency, Planter fasciitis, CAD status post stenting, CHF, CKD stage III, diabetes, hypertension, OSA who presents complaining of episode of dizziness with weakness and word finding difficulty.  His neurological symptoms has improved on presentation.  MRI of the brain was negative for acute stroke.  Patient was admitted for further evaluation of TIA.   Assessment & Plan:   Principal Problem:   TIA (transient ischemic attack) Active Problems:   GASTROESOPHAGEAL REFLUX DISEASE   DM2 (diabetes mellitus, type 2) (HCC)   CAD S/P multiple PCI   Hyperlipidemia   Peripheral neuropathy (HCC)   Left lumbar radiculopathy   CKD (chronic kidney disease), stage III (HCC)   Chronic heart failure with preserved ejection fraction (HFpEF) ([place])  1-TIA: -Patient presents with episode of dizziness, generalized weakness and word finding difficulty at.  Symptom has resolved. -CT head no acute abnormality.  MRI of the brain showed a small chronic left cerebellar infarct, no acute stroke. -Neurology consulted and following.  Awaiting recommendation -Continue with aspirin and statin --CTA head and neck: Normal CTA of head and neck. Hemoglobin A1c: 10.6 -LDL: 85 -Awaiting PT, OT evaluation. -We will also check for orthostatic vitals. -B 12 ;308 -Echo: Pending   2-CAD status post multiple stents; Continue with aspirin, Plavix, statin, Coreg and Imdur. Patient ECHO with diffuse hypokinesis, previously focal hypokinesis. Ef 45 % -50% previously 50%. He report some chest pain while back. He has not been taking aspirin. Troponin mildly elevated. I have consulted cardiologist .  3-Chronic diastolic heart failure Blood pressure soft, hold torsemide and spironolactone Plan to resume diuretics  as an outpatient He reports that his  torsemide dose was increased on Friday and Saturday.  He was also a started on Jardiance.   4-Diabetes type 2, uncontrolled, Hyperglycemia Continue with a sliding scale, continue with Jardiance We discuss about using glipizide at discharge as well. A1c: 10   5-CKD stage IIIa;  Creatinine  baseline 1.4--1.5 Received IV fluids in the setting of receiving contrast for his CT head and neck Hold diuretics. Cr increase to 1.6.    Lumbar spinal stenosis, neuropathy: Continue with tramadol and gabapentin GERD: Continue with PPI  Hypertension: Adjust blood pressure medications  Hyperlipidemia: Continue with the statins Hypokalemia: Replete orally   Estimated body mass index is 41.96 kg/m as calculated from the following:   Height as of this encounter: '6\' 1"'$  (1.854 m).   Weight as of this encounter: 144.2 kg.   DVT prophylaxis: Lovenox Code Status: Full code Family Communication: Care discussed with patient Disposition Plan:  Status is: Observation  The patient remains OBS appropriate and will d/c before 2 midnights.  Dispo: The patient is from: Home              Anticipated d/c is to: Home              Patient currently is not medically stable to d/c. Awaiting cardiology eval   Difficult to place patient No        Consultants:  Neurology  Procedures:  Echo Antimicrobials:    Subjective: He is alert and conversant, he reports resolution of dizziness, resolution of word finding difficulties.  He reports that his diuretic dose was increased on Friday for 2 days.  He was also started on Jardiance.   Objective: Vitals:  02/06/21 1025 02/06/21 1032 02/06/21 1045 02/06/21 1133  BP: (!) 116/92  101/66 (!) 104/92  Pulse: 61  61 63  Resp: 20  (!) 26 18  Temp:  98.5 F (36.9 C)  97.8 F (36.6 C)  TempSrc:  Oral  Oral  SpO2: 95%  93% 94%  Weight:      Height:       No intake or output data in the 24 hours ending 02/06/21  1344 Filed Weights   02/06/21 0614  Weight: (!) 144.2 kg    Examination:  General exam: Appears calm and comfortable  Respiratory system: Clear to auscultation. Respiratory effort normal. Cardiovascular system: S1 & S2 heard, RRR. No JVD, murmurs, rubs, gallops or clicks. No pedal edema. Gastrointestinal system: Abdomen is nondistended, soft and nontender. No organomegaly or masses felt. Normal bowel sounds heard. Central nervous system: Alert and oriented. No focal neurological deficits. Extremities: Symmetric 5 x 5 power. Skin: No rashes, lesions or ulcers    Data Reviewed: I have personally reviewed following labs and imaging studies  CBC: Recent Labs  Lab 02/02/21 0827 02/05/21 1023 02/05/21 1059 02/06/21 0638  WBC 7.6 7.1  --  6.2  NEUTROABS 4.3 2.8  --   --   HGB 15.9 16.7 18.0* 16.1  HCT 48.3 52.1* 53.0* 50.3  MCV 89.8 91.1  --  90.5  PLT 229 244  --  99991111   Basic Metabolic Panel: Recent Labs  Lab 02/02/21 0827 02/05/21 1023 02/05/21 1059 02/06/21 0638  NA 139 138 139 139  K 4.0 3.7 4.1 3.4*  CL 105 103 108 105  CO2 24 25  --  24  GLUCOSE 154* 137* 136* 156*  BUN 24* 22 29* 23  CREATININE 1.35* 1.61* 1.50* 1.42*  CALCIUM 9.4 9.7  --  9.3   GFR: Estimated Creatinine Clearance: 79.5 mL/min (A) (by C-G formula based on SCr of 1.42 mg/dL (H)). Liver Function Tests: Recent Labs  Lab 02/02/21 0827 02/05/21 1023  AST 26 20  ALT 21 22  ALKPHOS 61 59  BILITOT 0.8 0.9  PROT 7.0 7.6  ALBUMIN 3.7 3.9   No results for input(s): LIPASE, AMYLASE in the last 168 hours. No results for input(s): AMMONIA in the last 168 hours. Coagulation Profile: Recent Labs  Lab 02/05/21 1023  INR 1.0   Cardiac Enzymes: No results for input(s): CKTOTAL, CKMB, CKMBINDEX, TROPONINI in the last 168 hours. BNP (last 3 results) Recent Labs    01/18/21 1120  PROBNP 243*   HbA1C: Recent Labs    02/06/21 0323  HGBA1C 10.6*   CBG: Recent Labs  Lab 02/06/21 0821  02/06/21 1131  GLUCAP 161* 223*   Lipid Profile: Recent Labs    02/06/21 0323  CHOL 140  HDL 38*  LDLCALC 85  TRIG 84  CHOLHDL 3.7   Thyroid Function Tests: No results for input(s): TSH, T4TOTAL, FREET4, T3FREE, THYROIDAB in the last 72 hours. Anemia Panel: Recent Labs    02/06/21 0638  VITAMINB12 308   Sepsis Labs: No results for input(s): PROCALCITON, LATICACIDVEN in the last 168 hours.  Recent Results (from the past 240 hour(s))  Resp Panel by RT-PCR (Flu A&B, Covid) Nasopharyngeal Swab     Status: None   Collection Time: 02/05/21 10:30 AM   Specimen: Nasopharyngeal Swab; Nasopharyngeal(NP) swabs in vial transport medium  Result Value Ref Range Status   SARS Coronavirus 2 by RT PCR NEGATIVE NEGATIVE Final    Comment: (NOTE) SARS-CoV-2 target nucleic acids are NOT  DETECTED.  The SARS-CoV-2 RNA is generally detectable in upper respiratory specimens during the acute phase of infection. The lowest concentration of SARS-CoV-2 viral copies this assay can detect is 138 copies/mL. A negative result does not preclude SARS-Cov-2 infection and should not be used as the sole basis for treatment or other patient management decisions. A negative result may occur with  improper specimen collection/handling, submission of specimen other than nasopharyngeal swab, presence of viral mutation(s) within the areas targeted by this assay, and inadequate number of viral copies(<138 copies/mL). A negative result must be combined with clinical observations, patient history, and epidemiological information. The expected result is Negative.  Fact Sheet for Patients:  EntrepreneurPulse.com.au  Fact Sheet for Healthcare Providers:  IncredibleEmployment.be  This test is no t yet approved or cleared by the [place] FDA and  has been authorized for detection and/or diagnosis of SARS-CoV-2 by FDA under an Emergency Use Authorization (EUA). This EUA will  remain  in effect (meaning this test can be used) for the duration of the COVID-19 declaration under Section 564(b)(1) of the Act, 21 U.S.C.section 360bbb-3(b)(1), unless the authorization is terminated  or revoked sooner.       Influenza A by PCR NEGATIVE NEGATIVE Final   Influenza B by PCR NEGATIVE NEGATIVE Final    Comment: (NOTE) The Xpert Xpress SARS-CoV-2/FLU/RSV plus assay is intended as an aid in the diagnosis of influenza from Nasopharyngeal swab specimens and should not be used as a sole basis for treatment. Nasal washings and aspirates are unacceptable for Xpert Xpress SARS-CoV-2/FLU/RSV testing.  Fact Sheet for Patients: EntrepreneurPulse.com.au  Fact Sheet for Healthcare Providers: IncredibleEmployment.be  This test is not yet approved or cleared by the [place] FDA and has been authorized for detection and/or diagnosis of SARS-CoV-2 by FDA under an Emergency Use Authorization (EUA). This EUA will remain in effect (meaning this test can be used) for the duration of the COVID-19 declaration under Section 564(b)(1) of the Act, 21 U.S.C. section 360bbb-3(b)(1), unless the authorization is terminated or revoked.  Performed at [facility], [place] [address] [place], [place] [address]          Radiology Studies: CT ANGIO HEAD W OR WO CONTRAST  Result Date: 02/05/2021 CLINICAL DATA:  Transient ischemic attack EXAM: CT ANGIOGRAPHY HEAD AND NECK TECHNIQUE: Multidetector CT imaging of the head and neck was performed using the standard protocol during bolus administration of intravenous contrast. Multiplanar CT image reconstructions and MIPs were obtained to evaluate the vascular anatomy. Carotid stenosis measurements (when applicable) are obtained utilizing NASCET criteria, using the distal internal carotid diameter as the denominator. CONTRAST:  76m OMNIPAQUE IOHEXOL 350 MG/ML SOLN COMPARISON:  None. FINDINGS: CTA NECK  FINDINGS SKELETON: There is no bony spinal canal stenosis. No lytic or blastic lesion. OTHER NECK: Normal pharynx, larynx and major salivary glands. No cervical lymphadenopathy. Unremarkable thyroid gland. UPPER CHEST: No pneumothorax or pleural effusion. No nodules or masses. AORTIC ARCH: There is no calcific atherosclerosis of the aortic arch. There is no aneurysm, dissection or hemodynamically significant stenosis of the visualized portion of the aorta. Conventional 3 vessel aortic branching pattern. The visualized proximal subclavian arteries are widely patent. RIGHT CAROTID SYSTEM: Normal without aneurysm, dissection or stenosis. LEFT CAROTID SYSTEM: Normal without aneurysm, dissection or stenosis. VERTEBRAL ARTERIES: Left dominant configuration. Both origins are clearly patent. There is no dissection, occlusion or flow-limiting stenosis to the skull base (V1-V3 segments). CTA HEAD FINDINGS POSTERIOR CIRCULATION: --Vertebral arteries: Normal V4 segments. --Inferior cerebellar arteries:  Normal. --Basilar artery: Normal. --Superior cerebellar arteries: Normal. --Posterior cerebral arteries (PCA): Normal. ANTERIOR CIRCULATION: --Intracranial internal carotid arteries: Normal. --Anterior cerebral arteries (ACA): Normal. Both A1 segments are present. Patent anterior communicating artery (a-comm). --Middle cerebral arteries (MCA): Normal. VENOUS SINUSES: As permitted by contrast timing, patent. ANATOMIC VARIANTS: None Review of the MIP images confirms the above findings. IMPRESSION: Normal CTA of the head and neck. Electronically Signed   By: Ulyses Jarred M.D.   On: 02/05/2021 21:58   DG Chest 1 View  Result Date: 02/05/2021 CLINICAL DATA:  Dizziness, shortness of breath EXAM: CHEST  1 VIEW COMPARISON:  Prior chest x-ray 02/02/2021 FINDINGS: Stable cardiac and mediastinal contours. Trace linear atelectasis versus scarring in the right middle lobe. Mild vascular congestion without edema. No focal airspace  infiltrate, pleural effusion or pneumothorax. Stable mild bronchitic changes. No lytic or blastic osseous lesion. IMPRESSION: No active disease. Electronically Signed   By: Jacqulynn Cadet M.D.   On: 02/05/2021 11:33   CT HEAD WO CONTRAST  Result Date: 02/05/2021 CLINICAL DATA:  Dizziness with ambulation, generalized weakness. EXAM: CT HEAD WITHOUT CONTRAST TECHNIQUE: Contiguous axial images were obtained from the base of the skull through the vertex without intravenous contrast. COMPARISON:  Head CT dated 09/15/2011. FINDINGS: Brain: Ventricles are within normal limits in size and configuration. Mild chronic small vessel ischemic changes within the bilateral periventricular and subcortical white matter regions. No mass, hemorrhage, edema or other evidence of acute parenchymal abnormality. No extra-axial hemorrhage. Vascular: Chronic calcified atherosclerotic changes of the large vessels at the skull base. No unexpected hyperdense vessel. Skull: Normal. Negative for fracture or focal lesion. Sinuses/Orbits: No acute finding. Other: None. IMPRESSION: 1. No acute findings. No intracranial mass, hemorrhage or edema. 2. Mild chronic small vessel ischemic changes in the white matter. Electronically Signed   By: Franki Cabot M.D.   On: 02/05/2021 11:15   CT ANGIO NECK W OR WO CONTRAST  Result Date: 02/05/2021 CLINICAL DATA:  Transient ischemic attack EXAM: CT ANGIOGRAPHY HEAD AND NECK TECHNIQUE: Multidetector CT imaging of the head and neck was performed using the standard protocol during bolus administration of intravenous contrast. Multiplanar CT image reconstructions and MIPs were obtained to evaluate the vascular anatomy. Carotid stenosis measurements (when applicable) are obtained utilizing NASCET criteria, using the distal internal carotid diameter as the denominator. CONTRAST:  51m OMNIPAQUE IOHEXOL 350 MG/ML SOLN COMPARISON:  None. FINDINGS: CTA NECK FINDINGS SKELETON: There is no bony spinal canal  stenosis. No lytic or blastic lesion. OTHER NECK: Normal pharynx, larynx and major salivary glands. No cervical lymphadenopathy. Unremarkable thyroid gland. UPPER CHEST: No pneumothorax or pleural effusion. No nodules or masses. AORTIC ARCH: There is no calcific atherosclerosis of the aortic arch. There is no aneurysm, dissection or hemodynamically significant stenosis of the visualized portion of the aorta. Conventional 3 vessel aortic branching pattern. The visualized proximal subclavian arteries are widely patent. RIGHT CAROTID SYSTEM: Normal without aneurysm, dissection or stenosis. LEFT CAROTID SYSTEM: Normal without aneurysm, dissection or stenosis. VERTEBRAL ARTERIES: Left dominant configuration. Both origins are clearly patent. There is no dissection, occlusion or flow-limiting stenosis to the skull base (V1-V3 segments). CTA HEAD FINDINGS POSTERIOR CIRCULATION: --Vertebral arteries: Normal V4 segments. --Inferior cerebellar arteries: Normal. --Basilar artery: Normal. --Superior cerebellar arteries: Normal. --Posterior cerebral arteries (PCA): Normal. ANTERIOR CIRCULATION: --Intracranial internal carotid arteries: Normal. --Anterior cerebral arteries (ACA): Normal. Both A1 segments are present. Patent anterior communicating artery (a-comm). --Middle cerebral arteries (MCA): Normal. VENOUS SINUSES: As permitted by contrast timing, patent. ANATOMIC  VARIANTS: None Review of the MIP images confirms the above findings. IMPRESSION: Normal CTA of the head and neck. Electronically Signed   By: Ulyses Jarred M.D.   On: 02/05/2021 21:58   MR BRAIN WO CONTRAST  Result Date: 02/05/2021 CLINICAL DATA:  Vertigo, central. Dizziness. Fall last night. Generalized weakness. Resolved speech disturbance. EXAM: MRI HEAD WITHOUT CONTRAST TECHNIQUE: Multiplanar, multiecho pulse sequences of the brain and surrounding structures were obtained without intravenous contrast. COMPARISON:  Head CT 02/05/2021 FINDINGS: Brain: There is  no evidence of an acute infarct, intracranial hemorrhage, mass, midline shift, or extra-axial fluid collection. T2 hyperintensities in the cerebral white matter bilaterally are nonspecific but compatible with moderately age advanced chronic small vessel ischemic disease. There is a small chronic left cerebellar infarct. Mild cerebral atrophy is within normal limits for age. Vascular: Major intracranial vascular flow voids are preserved. Skull and upper cervical spine: Unremarkable bone marrow signal. Sinuses/Orbits: Unremarkable orbits. Mild mucosal thickening in the ethmoid and maxillary sinuses. Clear mastoid air cells. Other: None. IMPRESSION: 1. No acute intracranial abnormality. 2. Moderate chronic small vessel ischemic disease. 3. Small chronic left cerebellar infarct. Electronically Signed   By: Logan Bores M.D.   On: 02/05/2021 18:33        Scheduled Meds:  aspirin  81 mg Oral Daily   carvedilol  25 mg Oral BID WC   clopidogrel  75 mg Oral Daily   docusate sodium  100 mg Oral Daily   empagliflozin  10 mg Oral QAC breakfast   enoxaparin (LOVENOX) injection  70 mg Subcutaneous Q24H   gabapentin  300 mg Oral TID   insulin aspart  0-9 Units Subcutaneous TID WC   isosorbide mononitrate  90 mg Oral Daily   pantoprazole  40 mg Oral Daily   PARoxetine  20 mg Oral Daily   rosuvastatin  40 mg Oral Daily   vitamin B-12  1,000 mcg Oral Daily   Continuous Infusions:   LOS: 0 days    Time spent: 35 minutes    Seham Gardenhire A Jaye Saal, MD [facility]   If 7PM-7AM, please contact night-coverage [email or web address]  02/06/2021, 1:44 PM

## 2021-02-06 NOTE — Progress Notes (Addendum)
STROKE TEAM PROGRESS NOTE    Interval History  No acute events overnight, patient is sitting up in bed, no family at bedside.   States that he noted sx yesterday at 530 AM- difficulty ambulating and speaking. He began to feel better last night and is back at his baseline.   Educated about transient ischemic attack. States that he takes Plavix at home given his history of CAD but has not been taking it consistently. Educated about medication timing to help him take it more consistently.   Denies smoking and alcohol use. States that he has OSA but does not have a CPAP mask as of now.   Pertinent Lab Work and Imaging    02/05/21 CT Head WO IV Contrast 1. No acute findings. No intracranial mass, hemorrhage or edema. 2. Mild chronic small vessel ischemic changes in the white matter.  02/05/21 CT Angio Head and Neck W WO IV Contrast Normal CTA of the head and neck.  02/05/21 MRI Brain WO IV Contrast 1. No acute intracranial abnormality. 2. Moderate chronic small vessel ischemic disease. 3. Small chronic left cerebellar infarct.  02/06/21 Echocardiogram Complete  Done, results pending   Physical Examination   Constitutional: Calm, appropriate for condition  Cardiovascular: Normal RR Respiratory: No increased WOB   Mental status: AAOx3( able to answer LOC questions with prompting), following commands  Speech: Fluent, repetition and naming intact  Cranial nerves: EOMI, VFF, Face symmetric, Tongue midline  Motor: Normal bulk and tone. No drift. Strength 5/5 throughout  Sensory: Intact to light touch throughout  Coordination: Intact FNF  Gait: Deferred  NIHSS: 2 (missed age and month)   Assessment and Plan   [name] is a 65 y.o. male w/pmh of  anxiety, depression, B12 deficiency, CAD status post stenting, CHF, CKD 3, DM, HTN, prostatitis, hemorrhoids, GERD, HLD, lumbar spinal stenosis, obesity, OSA, tobacco abuse and neuropathy who presents with generalized weakness,  dizziness, unsteady gait and garbled speech. He was not a candidate for IVTPA or thrombectomy given lack of LVO/low NIHSS.   #Transient Ischemic Attack  Patient presented with the symptoms described above. At this time, stroke work up is complete. MRI Brain revealed no acute stroke. CTA Head and Neck showed no significant stenosis. Echo is pending. Stroke labs were completed including Lipid panel w/LDL 172 and Hemoglobin A1C 10.6. Stroke risk factors include hyperlipidemia, DMII and OSA. For stroke reduction he was started on DAPT for 21 days followed by Plavix monotherapy. Suspect that presenting sx were in the setting of a TIA.  - DAPT for 21 days followed by Plavix monotherapy for stroke prevention- if he needs to continue both Aspirin and Plavix from a Cardiac standpoint given history of multiple PCI in the setting of ACS then that is reasonable  - Atorvastatin 80 mg for stroke prevention  - No need for permissive hypertension given lack of acute stroke  - Referral placed for stroke follow up   #Hyperlipidemia From a stroke prevention stand point, the LDL goal is < 70. His LDL is 172, home Rosuvastatin transitioned to Atorvastatin 80 for better LDL control  #DMII Uncontrolled  Hemoglobin A1C this admission noted to be 10.6. Goal is < 7 from a stroke reduction standpoint. Recommend outpatient management of diabetes by PCP. SSI management while inpatient.  Stroke work up is complete. Neurology will sign off at this time. From a stroke standpoint he can discharge after being evaluated by therapies-PT and OT.   Hospital day # [place],  NP  [facility] Nurse Practitioner Patient seen and discussed with attending physician Dr. Carilyn Goodpasture MD NOTE :  I have personally obtained history,examined this patient, reviewed notes, independently viewed imaging studies, participated in medical decision making and plan of care.ROS completed by me personally and pertinent positives  fully documented  I have made any additions or clarifications directly to the above note. Agree with note above.  Patient presented with sudden onset of dizziness and gait ataxia likely due to posterior circulation TIA and brain imaging is negative for acute stroke.  Recommend continue dual antiplatelet therapy as he may have underlying cardiac indication for that long-term and aggressive risk factor modification.  Patient counseled to be compliant with using his CPAP.  Check echocardiogram results.  Greater than 50% time during the 35-minute visit was spent on counseling and coordination of care and discussion with care team.  Discussed with Dr. Justice Rocher.  Follow-up as an outpatient in the stroke clinic in 8 weeks  Antony Contras, [place] Pager: [phone] 02/06/2021 5:40 PM   To contact Stroke Continuity provider, please refer to [email or web address] After hours, contact General Neurology

## 2021-02-06 NOTE — Progress Notes (Signed)
*  PRELIMINARY RESULTS* Echocardiogram 2D Echocardiogram has been performed.  Luisa Hart RDCS 02/06/2021, 12:54 PM

## 2021-02-06 NOTE — ED Notes (Signed)
Attempted to call report to floor [phone]

## 2021-02-06 NOTE — ED Notes (Signed)
Pt still on bedside commode. Tucks pad provided for hemorrhoid relief.

## 2021-02-07 ENCOUNTER — Other Ambulatory Visit: Payer: Medicare HMO

## 2021-02-09 ENCOUNTER — Ambulatory Visit (INDEPENDENT_AMBULATORY_CARE_PROVIDER_SITE_OTHER): Payer: Medicare HMO | Admitting: *Deleted

## 2021-02-09 DIAGNOSIS — E1159 Type 2 diabetes mellitus with other circulatory complications: Secondary | ICD-10-CM

## 2021-02-09 DIAGNOSIS — I5042 Chronic combined systolic (congestive) and diastolic (congestive) heart failure: Secondary | ICD-10-CM | POA: Diagnosis not present

## 2021-02-09 NOTE — Patient Instructions (Addendum)
Visit Information  [name] [name], it was nice talking with you today.   Please read over the attached information, and start monitoring your blood sugars and weights at home every day as we have been discussing: please write your blood sugars and weights at home down on paper: we will review these during each of our phone call appointments   I look forward to talking to you again for an update on Wednesday, March 01, 2021 at 9:00 am- please be listening out for my call that day.  I will call as close to 9:00 am as possible.   If you need to cancel or re-schedule our telephone visit, please call [phone] and one of our care guides will be happy to assist you.   I look forward to hearing about your progress.   Please don't hesitate to contact me if I can be of assistance to you before our next scheduled telephone appointment.   [name], RN, BSN, [facility] RN Care Coordination- [place] [phone]: direct office [phone]: mobile   PATIENT GOALS:  Goals Addressed             This Visit's Progress    Patient Self-Care Activities   On track    Timeframe:  Long-Range Goal Priority:  High Start Date:      01/24/21                       Expected End Date:   01/24/22                    Patient will self administer medications as prescribed Patient will attend all scheduled provider appointments Patient will call pharmacy for medication refills Patient will call provider office for new concerns or questions Patient will begin checking and writing down his blood sugars first thing in the morning before eating and then again during the day 2 hours after eating a regular meal Patient will begin monitoring and writing down his weights at home every day         Blood Glucose Monitoring, Adult Monitoring your blood sugar (glucose) is an important part of managing your diabetes. Blood glucose monitoring involves checking your blood glucose  as often as directed and keeping a log or record of your results over time. Checking your blood glucose regularly and keeping a blood glucose log can: Help you and your health care provider adjust your diabetes management plan as needed, including your medicines or insulin. Help you understand how food, exercise, illnesses, and medicines affect your blood glucose. Let you know what your blood glucose is at any time. You can quickly find out if you have low blood glucose (hypoglycemia) or high blood glucose (hyperglycemia). Your health care provider will set individualized treatment goals for you. Your goals will be based on your age, other medical conditions you have, and how you respond to diabetes treatment. Generally, the goal of treatment is to maintain the following blood glucose levels: Before meals (preprandial): 80-130 mg/dL (4.4-7.2 mmol/L). After meals (postprandial): below 180 mg/dL (10 mmol/L). A1C level: less than 7%. Supplies needed: Blood glucose meter. Test strips for your meter. Each meter has its own strips. You must use the strips that came with your meter. A needle to prick your finger (lancet). Do not use a lancet more than one time. A device that holds the lancet (lancing device). A journal or log book to write down your results. How  to check your blood glucose Checking your blood glucose  Wash your hands for at least 20 seconds with soap and water. Prick the side of your finger (not the tip) with the lancet. Do not use the same finger consecutively. Gently rub the finger until a small drop of blood appears. Follow instructions that come with your meter for inserting the test strip, applying blood to the strip, and using your blood glucose meter. Write down your result and any notes in your log. Using alternative sites Some meters allow you to use areas of your body other than your finger (alternative sites) to test your blood. The most common alternative sites are the  forearm, the thigh, and the palm of your hand. Alternative sites may not be as accurate as the fingers because blood flow is slower in those areas. This means that the result you get may be delayed, and it may be different from the result that you would get from your finger. Use the finger only, and do not use alternative sites, if: You think you have hypoglycemia. You sometimes do not know that your blood glucose is getting low (hypoglycemia unawareness). General tips and recommendations Blood glucose log  Every time you check your blood glucose, write down your result. Also write down any notes about things that may be affecting your blood glucose, such as your diet and exercise for the day. This information can help you and your health care provider: Look for patterns in your blood glucose over time. Adjust your diabetes management plan as needed. Check if your meter allows you to download your records to a computer or if there is an app for the meter. Most glucose meters store a record of glucose readings in the meter. If you have type 1 diabetes: Check your blood glucose 4 or more times a day if you are on intensive insulin therapy with multiple daily injections (MDI) or if you are using an insulin pump. Check your blood glucose: Before every meal and snack. Before bedtime. Also check your blood glucose: If you have symptoms of hypoglycemia. After treating low blood glucose. Before doing activities that create a risk for injury, like driving or using machinery. Before and after exercise. Two hours after a meal. Occasionally between 2:00 a.m. and 3:00 a.m., as directed. You may need to check your blood glucose more often, 6-10 times per day, if: You have diabetes that is not well controlled. You are ill. You have a history of severe hypoglycemia. You have hypoglycemia unawareness. If you have type 2 diabetes: Check your blood glucose 2 or more times a day if you take insulin or other  diabetes medicines. Check your blood glucose 4 or more times a day if you are on intensive insulin therapy. Occasionally, you may also need to check your glucose between 2:00 a.m. and 3:00 a.m., as directed. Also check your blood glucose: Before and after exercise. Before doing activities that create a risk for injury, like driving or using machinery. You may need to check your blood glucose more often if: Your medicine is being adjusted. Your diabetes is not well controlled. You are ill. General tips Make sure you always have your supplies with you. After you use a few boxes of test strips, adjust (calibrate) your blood glucose meter by following instructions that came with your meter. If you have questions or need help, all blood glucose meters have a 24-hour hotline phone number available that you can call. Also contact your health care  provider with questions or concerns you may have. Where to find more information The American Diabetes Association: www.diabetes.org The Association of Diabetes Care & Education Specialists: www.diabeteseducator.org Contact a health care provider if: Your blood glucose is at or above 240 mg/dL (13.3 mmol/L) for 2 days in a row. You have been sick or have had a fever for 2 days or longer, and you are not getting better. You have any of the following problems for more than 6 hours: You cannot eat or drink. You have nausea or vomiting. You have diarrhea. Get help right away if: Your blood glucose is lower than 54 mg/dL (3 mmol/L). You become confused, or you have trouble thinking clearly. You have difficulty breathing. You have moderate or large ketone levels in your urine. These symptoms may represent a serious problem that is an emergency. Do not wait to see if the symptoms will go away. Get medical help right away. Call your local emergency services (911 in the [place] Do not drive yourself to the hospital. Summary Monitoring your blood glucose is an  important part of managing your diabetes. Blood glucose monitoring involves checking your blood glucose as often as directed and keeping a log or record of your results over time. Your health care provider will set individualized treatment goals for you. Your goals will be based on your age, other medical conditions you have, and how you respond to diabetes treatment. Every time you check your blood glucose, write down your result. Also, write down any notes about things that may be affecting your blood glucose, such as your diet and exercise for the day. This information is not intended to replace advice given to you by your health care provider. Make sure you discuss any questions you have with your health care provider. Document Revised: 02/24/2020 Document Reviewed: 02/24/2020 Elsevier Patient Education  2022 [place].   Patient verbalizes understanding of instructions provided today and agrees to view in [place].  Telephone follow up appointment with care management team member scheduled for:  Wednesday, March 01, 2021 at 9:00 am The patient has been provided with contact information for the care management team and has been advised to call with any health related questions or concerns  [name], RN, BSN, [place] [phone]: direct office [phone]: mobile

## 2021-02-09 NOTE — Chronic Care Management (AMB) (Signed)
Chronic Care Management   CCM RN Visit Note  02/09/2021 Name: [name] [name] MRN: [id number] DOB: 04-Jan-1956  Subjective: [name] [name] is a 65 y.o. year old male who is a primary care patient of Biagio Borg, MD. The care management team was consulted for assistance with disease management and care coordination needs.    Engaged with patient by telephone for follow up visit in response to provider referral for case management and/or care coordination services.   Consent to Services:  The patient was given information about Chronic Care Management services, agreed to services, and gave verbal consent prior to initiation of services.  Please see initial visit note for detailed documentation.  Patient agreed to services and verbal consent obtained.   Assessment: Review of patient past medical history, allergies, medications, health status, including review of consultants reports, laboratory and other test data, was performed as part of comprehensive evaluation and provision of chronic care management services.    CCM Care Plan Allergies  Allergen Reactions   Penicillin G Other (See Comments)    Did it involve swelling of the face/tongue/throat, SOB, or low BP?  N/A Did it involve sudden or severe rash/hives, skin peeling, or any reaction on the inside of your mouth or nose? N/A Did you need to seek medical attention at a hospital or doctor's office? N/A When did it last happen? Child If all above answers are "NO", may proceed with cephalosporin use.   Penicillins Other (See Comments)    Did it involve swelling of the face/tongue/throat, SOB, or low BP? N/A Did it involve sudden or severe rash/hives, skin peeling, or any reaction on the inside of your mouth or nose?N/A Did you need to seek medical attention at a hospital or doctor's office? N/A When did it last happen? Child      If all above answers are "NO", may proceed with cephalosporin use.   Outpatient Encounter Medications as of  02/09/2021  Medication Sig Note   aspirin 81 MG tablet Take 1 tablet (81 mg total) by mouth daily.    atorvastatin (LIPITOR) 80 MG tablet Take 1 tablet (80 mg total) by mouth at bedtime.    carvedilol (COREG) 25 MG tablet Take 1 tablet (25 mg total) by mouth 2 (two) times daily with a meal.    cetirizine (ZYRTEC) 10 MG tablet Take 10 mg by mouth daily.    clopidogrel (PLAVIX) 75 MG tablet Take 1 tablet (75 mg total) by mouth daily.    empagliflozin (JARDIANCE) 10 MG TABS tablet Take 1 tablet (10 mg total) by mouth daily before breakfast.    gabapentin (NEURONTIN) 300 MG capsule TAKE 1 CAPSULE(300 MG) BY MOUTH THREE TIMES DAILY (Patient taking differently: Take 300 mg by mouth 3 (three) times daily.)    glipiZIDE (GLUCOTROL) 5 MG tablet Take 1 tablet (5 mg total) by mouth 2 (two) times daily.    isosorbide mononitrate (IMDUR) 30 MG 24 hr tablet TAKE 3 TABLETS(90 MG) BY MOUTH DAILY (Patient taking differently: Take 90 mg by mouth daily.)    nitroGLYCERIN (NITROSTAT) 0.4 MG SL tablet PLACE 1 TABLET UNDER THE TONGUE EVERY 5 MINUTES AS NEEDED FOR CHEST PAIN (Patient taking differently: Place 0.4 mg under the tongue every 5 (five) minutes as needed for chest pain.) 02/09/2021: 02/09/21- reports has not filled yet- has some older bottles at home; advised to get new fresh refill    pantoprazole (PROTONIX) 40 MG tablet Take 1 tablet (40 mg total) by mouth daily.  PARoxetine (PAXIL) 20 MG tablet Take 1 tablet (20 mg total) by mouth daily. 02/09/2021: 02/09/22: reports needs refill, to call pharmacy   potassium chloride (KLOR-CON) 10 MEQ tablet Take 2 tablets (20 mEq total) by mouth 2 (two) times daily. (Patient taking differently: Take 10-20 mEq by mouth 2 (two) times daily. 20 mEq in the AM and 10 mEq in the afternoon.)    spironolactone (ALDACTONE) 25 MG tablet Take 0.5 tablets (12.5 mg total) by mouth daily.    torsemide (DEMADEX) 20 MG tablet Take 1 tablet (20 mg total) by mouth 2 (two) times daily.     traMADol (ULTRAM) 50 MG tablet TAKE 1 TABLET(50 MG) BY MOUTH EVERY 6 HOURS AS NEEDED (Patient taking differently: Take 50 mg by mouth every 6 (six) hours as needed for moderate pain.)    vitamin B-12 (CYANOCOBALAMIN) 1000 MCG tablet Take 1 tablet (1,000 mcg total) by mouth daily.    Alcohol Swabs (B-D SINGLE USE SWABS BUTTERFLY) PADS Use up to 4 times a day to test blood sugars. Dx: E10.9, E11.9    Blood Glucose Calibration (TRUE METRIX LEVEL 1) Low SOLN Use as needed. Dx: E10.9, E11.9    blood glucose meter kit and supplies Dispense based on patient and insurance preference. Use up to four times daily as directed. (FOR ICD-10 E10.9, E11.9). 02/09/2021: 02/09/21: has not yet obtained- encouraged to obtain soon   glucose blood (TRUE METRIX BLOOD GLUCOSE TEST) test strip Use to test blood sugars up to 4 times a day. DX: E10.9, E.11.9    hydrocortisone cream 1 % Apply to affected area 2 times daily (Patient not taking: No sig reported)    Lancets 33G MISC Use to test blood sugars up to 4 times a day. DX: E10.9, E11.9    No facility-administered encounter medications on file as of 02/09/2021.   Patient Active Problem List   Diagnosis Date Noted   TIA (transient ischemic attack) 02/05/2021   Aortic atherosclerosis ([place]) 12/26/2020   Cough 12/26/2020   Vitamin D deficiency 04/03/2020   B12 deficiency 04/03/2020   Itching 11/22/2019   Chronic heart failure with preserved ejection fraction (HFpEF) ([place]) 05/13/2019   Chronic coronary artery disease 05/13/2019   Elevated troponin 05/13/2019   Morbid obesity with BMI of 40.0-44.9, adult ([place]) 05/13/2019   Pre-ulcerative calluses 02/03/2019   Foot injury, left, initial encounter 11/26/2018   Chest pain 05/30/2017   Hypokalemia 05/30/2017   Allergic rhinitis 12/18/2016   Lumbar radiculitis 09/05/2016   Lumbar spinal stenosis 09/05/2016   CKD (chronic kidney disease), stage III ([place]) 12/29/2015   Chest pain syndrome 12/15/2015   Acute kidney injury  ([place]) 12/15/2015   Atypical chest pain 12/15/2015   Encounter for well adult exam with abnormal findings 06/27/2015   Chronic low back pain 06/27/2015   Left lumbar radiculopathy 04/13/2015   Bilateral plantar fasciitis 04/13/2015   Essential hypertension 03/09/2015   OSA (obstructive sleep apnea) 03/27/2014   Peripheral neuropathy ([place]) 12/29/2013   External hemorrhoid, thrombosed 12/03/2013   Sinus bradycardia 11/15/2013   NSTEMI (non-ST elevated myocardial infarction) ([place]) 11/14/2013   Chronic combined systolic and diastolic CHF  99/35/7017   CAD S/P multiple PCI    Hyperlipidemia    DM2 (diabetes mellitus, type 2) ([place]) 06/26/2013   Gait disorder 06/26/2013   Obesity, Class III, BMI 40-49.9 (morbid obesity) ([place]) 09/10/2012   Tobacco abuse 09/02/2012   Ischemic cardiomyopathy- EF 50-55% June 2015    ST elevation myocardial infarction (STEMI) of anterior wall, initial  episode of care ([place]) 09/01/2012   HEMORRHOIDS 12/26/2009   Acute prostatitis 12/26/2009   GANGLION CYST, WRIST, RIGHT 11/28/2009   COLONIC POLYPS 10/07/2009   Anxiety with depression 07/21/2009   DYSPNEA 03/24/2009   GASTROESOPHAGEAL REFLUX DISEASE 01/27/2007   PERCUTANEOUS TRANSLUMINAL CORONARY ANGIOPLASTY, HX OF 02/09/2006   Conditions to be addressed/monitored:  CHF and DMII  Care Plan : RN Care Manager Plan of Care  Updates made by Knox Royalty, RN since 02/09/2021 12:00 AM     Problem: Chronic Disease Management Needs   Priority: High     Long-Range Goal: Development of plan of care for long term chronic disease management   Start Date: 01/25/2021  Expected End Date: 01/25/2022  Priority: High  Note:   Current Barriers:  Chronic Disease Management support and education needs related to CHF and DMII Literacy barriers- patient reports needs assistance understanding health care advice Has not obtained glucometer/ scales from insurance provider: confirmed patient has plan in place to obtain these  items: 02/09/21: reports still waiting on items to arrive Recent hospitalization (observation) for TIA August 28-29, 2022  RNCM Clinical Goal(s):  Patient will demonstrate improved adherence to prescribed treatment plan for CHF and DMII as evidenced by daily monitoring and recording of CBG  adherence to ADA/ carb modified diet adherence to prescribed medication regimen contacting provider for new or worsened symptoms or questions monitoring and recording daily weights at home    through collaboration with RN Care manager, provider, and care team.   Interventions: 1:1 collaboration with primary care provider regarding development and update of comprehensive plan of care as evidenced by provider attestation and co-signature Inter-disciplinary care team collaboration (see longitudinal plan of care) Evaluation of current treatment plan related to  self management and patient's adherence to plan as established by provider  Heart Failure Interventions:  (Status: Goal on track: YES.) Basic overview and discussion of pathophysiology of Heart Failure reviewed; Assessed need for readable accurate scales in home; Advised patient to weigh each morning after emptying bladder; Discussed importance of daily weight and advised patient to weigh and record daily; Discussed the importance of keeping all appointments with provider; Advised patient to discuss fluid restriction for individual needs in setting of CKDIII with provider; Reviewed with patient recent hospitalization, post-discharge hospital instructions: patient verbalizes good understanding of same Reviewed medications with patient: no concerns, discrepancies, or issues identified: patient confirms he has stopped taking rosuvastatin and has started Atorvastatin as instructed; confirms he has obtained and has started taking glipizide as prescribed; medication list in EHR updated accordingly Provided verbal education/ guidelines/ cautions around safe use of  SL NTG at home Reiterated previously provided education around rationale/ importance of daily weight management and recording at home: he has still not yet received scales from insurance company- considering going out to buy his own; re-reviewed weight gain guidelines and corresponding action plan for same Confirmed patient has plans to attend 03/03/21 cardiology provider office visit as scheduled  Diabetes:  (Status: New goal.) Lab Results  Component Value Date   HGBA1C 12.1 Repeated and verified X2. (H) 12/26/2020  Assessed patient's understanding of A1c goal: <6.5% Provided education to patient about basic DM disease process; Reviewed medications with patient and discussed importance of medication adherence;        Counseled on importance of regular laboratory monitoring as prescribed;        Discussed plans with patient for ongoing care management follow up and provided patient with direct contact information for care management  team;      Advised patient, providing education and rationale, to check cbg twice a day- fasting and 2-hours post-prandial and record        Patient has not yet received necessary supplies for blood sugar monitoring at home- states he has contacted his insurance company pharmacy mail order to obtain- I noted it appears that this Rx has also been placed with local pharmacy Oncologist) post-hospital discharge; patient will check status with local pharmacy Re-reviewed with patient best times to monitor blood sugars at home to assess medication/ diet effectiveness; explained rationale for timing of blood sugar monitoring at home Confirmed patient has continued taking recently prescribed jardiance- reports he was supplied with coupons by cardiology provider, can afford "for now" -- possibility of pharmacy referral in future discussed with patient CCM RN CM Initial assessment completed  Patient Goals/Self-Care Activities: Patient will self administer medications as  prescribed Patient will attend all scheduled provider appointments Patient will call pharmacy for medication refills Patient will call provider office for new concerns or questions Patient will begin checking and writing down his blood sugars first thing in the morning before eating and then again during the day 2 hours after eating a regular meal Patient will begin monitoring and writing down his weights at home every day      Plan: Telephone follow up appointment with care management team member scheduled for:  Wednesday, March 01, 2021 at 9:00 am The patient has been provided with contact information for the care management team and has been advised to call with any health related questions or concerns.   [name], RN, BSN, [facility] RN Care Coordination- [place] [phone]: direct office [phone]: mobile

## 2021-02-14 ENCOUNTER — Other Ambulatory Visit: Payer: Self-pay | Admitting: Internal Medicine

## 2021-02-16 ENCOUNTER — Telehealth: Payer: Self-pay | Admitting: Internal Medicine

## 2021-02-16 DIAGNOSIS — E1159 Type 2 diabetes mellitus with other circulatory complications: Secondary | ICD-10-CM

## 2021-02-16 NOTE — Telephone Encounter (Signed)
   Patient requesting order for TRUE METRIX GLUCOMETER be sent to [facility]

## 2021-02-17 MED ORDER — TRUE METRIX BLOOD GLUCOSE TEST VI STRP: ORAL_STRIP | 12 refills | Status: DC

## 2021-02-17 MED ORDER — LANCETS 33G MISC: 6 refills | Status: DC

## 2021-03-01 ENCOUNTER — Ambulatory Visit: Payer: Medicare HMO | Admitting: *Deleted

## 2021-03-01 DIAGNOSIS — E1159 Type 2 diabetes mellitus with other circulatory complications: Secondary | ICD-10-CM

## 2021-03-01 DIAGNOSIS — I5042 Chronic combined systolic (congestive) and diastolic (congestive) heart failure: Secondary | ICD-10-CM

## 2021-03-01 NOTE — Chronic Care Management (AMB) (Signed)
Chronic Care Management   CCM RN Visit Note  03/01/2021 Name: [name] [name] MRN: [id number] DOB: 06/01/1956  Subjective: [name] [name] is a 65 y.o. year old male who is a primary care patient of Biagio Borg, MD. The care management team was consulted for assistance with disease management and care coordination needs.    Engaged with patient by telephone for follow up visit in response to provider referral for case management and/or care coordination services.   Consent to Services:  The patient was given information about Chronic Care Management services, agreed to services, and gave verbal consent prior to initiation of services.  Please see initial visit note for detailed documentation.  Patient agreed to services and verbal consent obtained.   Assessment: Review of patient past medical history, allergies, medications, health status, including review of consultants reports, laboratory and other test data, was performed as part of comprehensive evaluation and provision of chronic care management services.   CCM Care Plan Allergies  Allergen Reactions   Penicillin G Other (See Comments)    Did it involve swelling of the face/tongue/throat, SOB, or low BP?  N/A Did it involve sudden or severe rash/hives, skin peeling, or any reaction on the inside of your mouth or nose? N/A Did you need to seek medical attention at a hospital or doctor's office? N/A When did it last happen? Child If all above answers are "NO", may proceed with cephalosporin use.   Penicillins Other (See Comments)    Did it involve swelling of the face/tongue/throat, SOB, or low BP? N/A Did it involve sudden or severe rash/hives, skin peeling, or any reaction on the inside of your mouth or nose?N/A Did you need to seek medical attention at a hospital or doctor's office? N/A When did it last happen? Child      If all above answers are "NO", may proceed with cephalosporin use.   Outpatient Encounter Medications as of  03/01/2021  Medication Sig Note   Alcohol Swabs (B-D SINGLE USE SWABS BUTTERFLY) PADS Use up to 4 times a day to test blood sugars. Dx: E10.9, E11.9    aspirin 81 MG tablet Take 1 tablet (81 mg total) by mouth daily.    atorvastatin (LIPITOR) 80 MG tablet Take 1 tablet (80 mg total) by mouth at bedtime.    Blood Glucose Calibration (TRUE METRIX LEVEL 1) Low SOLN Use as needed. Dx: E10.9, E11.9    blood glucose meter kit and supplies Dispense based on patient and insurance preference. Use up to four times daily as directed. (FOR ICD-10 E10.9, E11.9). 02/09/2021: 02/09/21: has not yet obtained- encouraged to obtain soon   carvedilol (COREG) 25 MG tablet Take 1 tablet (25 mg total) by mouth 2 (two) times daily with a meal.    cetirizine (ZYRTEC) 10 MG tablet TAKE 1 TABLET BY MOUTH DAILY    clopidogrel (PLAVIX) 75 MG tablet Take 1 tablet (75 mg total) by mouth daily.    empagliflozin (JARDIANCE) 10 MG TABS tablet Take 1 tablet (10 mg total) by mouth daily before breakfast.    gabapentin (NEURONTIN) 300 MG capsule TAKE 1 CAPSULE(300 MG) BY MOUTH THREE TIMES DAILY (Patient taking differently: Take 300 mg by mouth 3 (three) times daily.)    glipiZIDE (GLUCOTROL) 5 MG tablet Take 1 tablet (5 mg total) by mouth 2 (two) times daily.    glucose blood (TRUE METRIX BLOOD GLUCOSE TEST) test strip Use to test blood sugars up to 4 times a day. DX: E10.9, E.11.9  hydrocortisone cream 1 % Apply to affected area 2 times daily (Patient not taking: No sig reported)    isosorbide mononitrate (IMDUR) 30 MG 24 hr tablet TAKE 3 TABLETS(90 MG) BY MOUTH DAILY (Patient taking differently: Take 90 mg by mouth daily.)    Lancets 33G MISC Use to test blood sugars up to 4 times a day. DX: E10.9, E11.9    nitroGLYCERIN (NITROSTAT) 0.4 MG SL tablet PLACE 1 TABLET UNDER THE TONGUE EVERY 5 MINUTES AS NEEDED FOR CHEST PAIN    pantoprazole (PROTONIX) 40 MG tablet Take 1 tablet (40 mg total) by mouth daily.    PARoxetine (PAXIL) 20 MG  tablet Take 1 tablet (20 mg total) by mouth daily. 02/09/2021: 02/09/22: reports needs refill, to call pharmacy   potassium chloride (KLOR-CON) 10 MEQ tablet Take 2 tablets (20 mEq total) by mouth 2 (two) times daily. (Patient taking differently: Take 10-20 mEq by mouth 2 (two) times daily. 20 mEq in the AM and 10 mEq in the afternoon.)    spironolactone (ALDACTONE) 25 MG tablet Take 0.5 tablets (12.5 mg total) by mouth daily.    torsemide (DEMADEX) 20 MG tablet Take 1 tablet (20 mg total) by mouth 2 (two) times daily.    traMADol (ULTRAM) 50 MG tablet TAKE 1 TABLET(50 MG) BY MOUTH EVERY 6 HOURS AS NEEDED (Patient taking differently: Take 50 mg by mouth every 6 (six) hours as needed for moderate pain.)    vitamin B-12 (CYANOCOBALAMIN) 1000 MCG tablet Take 1 tablet (1,000 mcg total) by mouth daily.    No facility-administered encounter medications on file as of 03/01/2021.   Patient Active Problem List   Diagnosis Date Noted   TIA (transient ischemic attack) 02/05/2021   Aortic atherosclerosis ([place]) 12/26/2020   Cough 12/26/2020   Vitamin D deficiency 04/03/2020   B12 deficiency 04/03/2020   Itching 11/22/2019   Chronic heart failure with preserved ejection fraction (HFpEF) ([place]) 05/13/2019   Chronic coronary artery disease 05/13/2019   Elevated troponin 05/13/2019   Morbid obesity with BMI of 40.0-44.9, adult ([place]) 05/13/2019   Pre-ulcerative calluses 02/03/2019   Foot injury, left, initial encounter 11/26/2018   Chest pain 05/30/2017   Hypokalemia 05/30/2017   Allergic rhinitis 12/18/2016   Lumbar radiculitis 09/05/2016   Lumbar spinal stenosis 09/05/2016   CKD (chronic kidney disease), stage III ([place]) 12/29/2015   Chest pain syndrome 12/15/2015   Acute kidney injury ([place]) 12/15/2015   Atypical chest pain 12/15/2015   Encounter for well adult exam with abnormal findings 06/27/2015   Chronic low back pain 06/27/2015   Left lumbar radiculopathy 04/13/2015   Bilateral plantar fasciitis  04/13/2015   Essential hypertension 03/09/2015   OSA (obstructive sleep apnea) 03/27/2014   Peripheral neuropathy ([place]) 12/29/2013   External hemorrhoid, thrombosed 12/03/2013   Sinus bradycardia 11/15/2013   NSTEMI (non-ST elevated myocardial infarction) ([place]) 11/14/2013   Chronic combined systolic and diastolic CHF  60/73/7106   CAD S/P multiple PCI    Hyperlipidemia    DM2 (diabetes mellitus, type 2) ([place]) 06/26/2013   Gait disorder 06/26/2013   Obesity, Class III, BMI 40-49.9 (morbid obesity) ([place]) 09/10/2012   Tobacco abuse 09/02/2012   Ischemic cardiomyopathy- EF 50-55% June 2015    ST elevation myocardial infarction (STEMI) of anterior wall, initial episode of care ([place]) 09/01/2012   HEMORRHOIDS 12/26/2009   Acute prostatitis 12/26/2009   GANGLION CYST, WRIST, RIGHT 11/28/2009   COLONIC POLYPS 10/07/2009   Anxiety with depression 07/21/2009   DYSPNEA 03/24/2009   GASTROESOPHAGEAL  REFLUX DISEASE 01/27/2007   PERCUTANEOUS TRANSLUMINAL CORONARY ANGIOPLASTY, HX OF 02/09/2006   Conditions to be addressed/monitored:  CHF and DMII  Care Plan : RN Care Manager Plan of Care  Updates made by Knox Royalty, RN since 03/01/2021 12:00 AM     Problem: Chronic Disease Management Needs   Priority: High     Long-Range Goal: Development of plan of care for long term chronic disease management   Start Date: 01/25/2021  Expected End Date: 01/25/2022  Priority: High  Note:   Current Barriers:  Chronic Disease Management support and education needs related to CHF and DMII Literacy barriers- patient reports needs assistance understanding health care advice Has not obtained glucometer/ scales from insurance provider: confirmed patient has plan in place to obtain these items: 02/09/21: reports still waiting on items to arrive; 03/01/21: patient reports has called insurance company and PCP office to facilitate obtaining glucometer- has not heard back from either-- will place CCM Pharmacy  referral- see notes in blue text below; reports has still not obtained new scales (reports scales he had received previously from insurance company are not accurate Recent hospitalization (observation) for TIA August 28-29, 2022  RNCM Clinical Goal(s):  Patient will demonstrate improved adherence to prescribed treatment plan for CHF and DMII as evidenced by daily monitoring and recording of CBG  adherence to ADA/ carb modified diet adherence to prescribed medication regimen contacting provider for new or worsened symptoms or questions monitoring and recording daily weights at home    through collaboration with RN Care manager, provider, and care team.   Interventions: 1:1 collaboration with primary care provider regarding development and update of comprehensive plan of care as evidenced by provider attestation and co-signature Inter-disciplinary care team collaboration (see longitudinal plan of care) Evaluation of current treatment plan related to  self management and patient's adherence to plan as established by provider  Heart Failure Interventions:  (Status: Goal on track: YES.) Basic overview and discussion of pathophysiology of Heart Failure reviewed; Assessed need for readable accurate scales in home; Advised patient to weigh each morning after emptying bladder; Discussed importance of daily weight and advised patient to weigh and record daily; Discussed the importance of keeping all appointments with provider; Patient reports he has been unable to buy new scales, as he had previously reported he would do- states he has "had a lot going on" Reviewed previously provided education around signs/ symptoms yellow zone for CHF: he denies all; states no concerns with current clinical condition/ breathing status/ LE swelling Confirms that he has obtained weekly pill box and has filled several times; states he has been successful in using pill box, but is challenged by weekly self-filling; states he  becomes confused: he had previously declined [facility] referral due to his belief that he could self- manage medications: I encouraged him to reconsider, and today, he is agreeable; explained that [place] [place] team may be able to assist him in switching pharmacies if necessary, assisting with medication management in general, obtaining the glucometer he needs to begin blood sugar monitoring at home, and possible obtaining compliance packs; encouraged patient to listen out for call from [facility] team Confirmed patient has plans to attend 03/03/21 cardiology provider office visit as scheduled  Diabetes:  (Status: Goal on track: YES.) Lab Results  Component Value Date   HGBA1C 12.1 Repeated and verified X2. (H) 12/26/2020  Provided education to patient about basic DM disease process; Discussed plans with patient for ongoing care management follow up and  provided patient with direct contact information for care management team;      Advised patient, providing education and rationale, to check cbg twice a day- fasting and 2-hours post-prandial and record        Referral made to pharmacy team for assistance with obtaining prescription for glucometer, general medication management at home;       Patient again reports has not yet received necessary supplies for blood sugar monitoring at home- states he has contacted his insurance company pharmacy to obtain as well as PCP office on 02/16/21 after he reported the outpatient pharmacy the prescription was sent to told him that he would have a "high co-pay"-- reports has not heard back from insurance company nor PCP office- I encouraged him to re-contact his insurance provider and updated patient that I would reach out to PCP/ make [facility] referral Reports he had one incident since our last outreach on 02/09/21 when he was out shopping and "started feeling really sick, weak, and just bad;" reports he believes this may have been a low blood sugar episode, as  he ate some trail mix that another customer provided to him, and he "immediately felt much better;" encouraged patient to try and keep snacks on him at all times in case another episode occurs Re-reviewed with patient best times to monitor blood sugars at home to assess medication/ diet effectiveness; explained rationale for timing of blood sugar monitoring at home Confirmed patient has continued taking recently prescribed jardiance- again reports he was supplied with coupons by cardiology provider, can afford "for now" -- CCM pharmacy referral placed to assist in determining patient's eligibility for PAP, as well as general medication management  Patient Goals/Self-Care Activities: Patient will self administer medications as prescribed Patient will attend all scheduled provider appointments Patient will call pharmacy for medication refills Patient will call provider office for new concerns or questions Once a glucometer (blood sugar meter) is obtained, patient will begin checking and writing down on paper blood sugars first thing in the morning before eating and then again during the day 2 hours after eating a regular meal Once scales are obtained, patient will begin monitoring and writing down on paper weights at home every day Patient will obtained a flu vaccine for the 2022-2023 flu season- please take steps to obtain your flu vaccine in October 2022         Plan: Telephone follow up appointment with care management team member scheduled for:  Tuesday, April 04, 2021 at 9:00 am The patient has been provided with contact information for the care management team and has been advised to call with any health related questions or concerns.   [name], RN, BSN, [facility] RN Care Coordination- [place] [phone]: direct office [phone]: mobile

## 2021-03-01 NOTE — Patient Instructions (Signed)
Visit Information  [name] [name], it was nice talking with you today.   Please read over the attached information, and listen out for a phone call from the Pharmacy team at Dr. Gwynn Burly office: they may be able to assist you in managing your medications at home, obtaining the blood sugar meter that you need to begin monitoring your blood sugars at home, and apply for patient assistance with your jardiance prescription   I look forward to talking to you again for an update on Tuesday, April 04, 2021 at 9:00 am- please be listening out for my call that day.  I will call as close to 9:00 am as possible.   If you need to cancel or re-schedule our telephone visit, please call [phone] and one of our care guides will be happy to assist you.   I look forward to hearing about your progress.   Please don't hesitate to contact me if I can be of assistance to you before our next scheduled telephone appointment.   [name], RN, BSN, [facility] RN Care Coordination- [place] [phone]: direct office [phone]: mobile   PATIENT GOALS:  Goals Addressed             This Visit's Progress    Patient Self-Care Activities   On track    Timeframe:  Long-Range Goal Priority:  High Start Date:      01/24/21                       Expected End Date:   01/24/22                    Patient will self administer medications as prescribed Patient will attend all scheduled provider appointments Patient will call pharmacy for medication refills Patient will call provider office for new concerns or questions Once a glucometer (blood sugar meter) is obtained, patient will begin checking and writing down on paper blood sugars first thing in the morning before eating and then again during the day 2 hours after eating a regular meal Once scales are obtained, patient will begin monitoring and writing down on paper weights at home every day Patient will obtained a flu  vaccine for the 2022-2023 flu season- please take steps to obtain your flu vaccine in October 2022         Influenza (Flu) Vaccine (Inactivated or Recombinant): What You Need to Know 1. Why get vaccinated? Influenza vaccine can prevent influenza (flu). Flu is a contagious disease that spreads around the [place] every year, usually between October and May. Anyone can get the flu, but it is more dangerous for some people. Infants and young children, people 81 years and older, pregnant people, and people with certain health conditions or a weakened immune system are at greatest risk of flu complications. Pneumonia, bronchitis, sinus infections, and ear infections are examples of flu-related complications. If you have a medical condition, such as heart disease, cancer, or diabetes, flu can make it worse. Flu can cause fever and chills, sore throat, muscle aches, fatigue, cough, headache, and runny or stuffy nose. Some people may have vomiting and diarrhea, though this is more common in children than adults. In an average year, thousands of people in the [place] die from flu, and many more are hospitalized. Flu vaccine prevents millions of illnesses and flu-related visits to the doctor each year. 2. Influenza vaccines CDC recommends everyone 6 months and older  get vaccinated every flu season. Children 6 months through 47 years of age may need 2 doses during a single flu season. Everyone else needs only 1 dose each flu season. It takes about 2 weeks for protection to develop after vaccination. There are many flu viruses, and they are always changing. Each year a new flu vaccine is made to protect against the influenza viruses believed to be likely to cause disease in the upcoming flu season. Even when the vaccine doesn't exactly match these viruses, it may still provide some protection. Influenza vaccine does not cause flu. Influenza vaccine may be given at the same time as other vaccines. 3.  Talk with your health care provider Tell your vaccination provider if the person getting the vaccine: Has had an allergic reaction after a previous dose of influenza vaccine, or has any severe, life-threatening allergies Has ever had Guillain-Barr Syndrome (also called "GBS") In some cases, your health care provider may decide to postpone influenza vaccination until a future visit. Influenza vaccine can be administered at any time during pregnancy. People who are or will be pregnant during influenza season should receive inactivated influenza vaccine. People with minor illnesses, such as a cold, may be vaccinated. People who are moderately or severely ill should usually wait until they recover before getting influenza vaccine. Your health care provider can give you more information. 4. Risks of a vaccine reaction Soreness, redness, and swelling where the shot is given, fever, muscle aches, and headache can happen after influenza vaccination. There may be a very small increased risk of Guillain-Barr Syndrome (GBS) after inactivated influenza vaccine (the flu shot). Young children who get the flu shot along with pneumococcal vaccine (PCV13) and/or DTaP vaccine at the same time might be slightly more likely to have a seizure caused by fever. Tell your health care provider if a child who is getting flu vaccine has ever had a seizure. People sometimes faint after medical procedures, including vaccination. Tell your provider if you feel dizzy or have vision changes or ringing in the ears. As with any medicine, there is a very remote chance of a vaccine causing a severe allergic reaction, other serious injury, or death. 5. What if there is a serious problem? An allergic reaction could occur after the vaccinated person leaves the clinic. If you see signs of a severe allergic reaction (hives, swelling of the face and throat, difficulty breathing, a fast heartbeat, dizziness, or weakness), call 9-1-1 and get  the person to the nearest hospital. For other signs that concern you, call your health care provider. Adverse reactions should be reported to the Vaccine Adverse Event Reporting System (VAERS). Your health care provider will usually file this report, or you can do it yourself. Visit the VAERS website at www.vaers.SamedayNews.es or call 562-415-3960. VAERS is only for reporting reactions, and VAERS staff members do not give medical advice. 6. The National Vaccine Injury Compensation Program The [employer] Vaccine Injury Compensation Program (VICP) is a federal program that was created to compensate people who may have been injured by certain vaccines. Claims regarding alleged injury or death due to vaccination have a time limit for filing, which may be as short as two years. Visit the VICP website at GoldCloset.com.ee or call (650)363-2433 to learn about the program and about filing a claim. 7. How can I learn more? Ask your health care provider. Call your local or state health department. Visit the website of the Food and Drug Administration (FDA) for vaccine package inserts and additional  information at TraderRating.uy. Contact the Centers for Disease Control and Prevention (CDC): Call (640) 235-1200 (1-800-CDC-INFO) or Visit CDC's website at https://gibson.com/. Vaccine Information Statement Inactivated Influenza Vaccine (01/15/2020) This information is not intended to replace advice given to you by your health care provider. Make sure you discuss any questions you have with your health care provider. Document Revised: 03/03/2020 Document Reviewed: 03/03/2020 Elsevier Patient Education  2022 [place].  Patient verbalizes understanding of instructions provided today and agrees to view in MyChart Telephone follow up appointment with care management team member scheduled for:  Tuesday, April 04, 2021 at 9:00 am The patient has been provided with contact  information for the care management team and has been advised to call with any health related questions or concerns  [name], RN, BSN, [place] [facility] [phone]: direct office [phone]: mobile

## 2021-03-02 NOTE — Progress Notes (Signed)
Cardiology Office Note:    Date:  03/03/2021   ID:  [name] [name], DOB 09/19/1955, MRN [id number]  PCP:  Biagio Borg, MD   [place] Providers Cardiologist:  Sherren Mocha, MD Cardiology APP:  Sharmon [place]     Referring MD: Biagio Borg, MD   Chief Complaint:  F/u CAD, CHF; Recent admit for TIA    Patient Profile:   [name] [name] is a 65 y.o. male with:  Coronary artery disease S/p PCI to RCA and PL1 S/p cutting POBA to RCA 2/2 ISR in 5/08 S/p ant STEMI in 3/14 >> PCI: DES to LAD S/p NSTEMI in 6/15 >> PCI: DES to [place] S/p NSTEMI 10/15 >> Med Rx Myoview 1/17: no ischemia Cath at [facility] 05/2019: RCA 100 CTO Heart failure with preserved ejection fraction  Ischemic CM Echocardiogram 05/2019 ([facility]): EF 50-55 Echocardiogram 8/21: EF 50, inf AK Supraventricular tachycardia Monitor 9/21: Longest 17 seconds beta-blocker increased  PVCs (monitor 9/21: 6% burden) beta-blocker Rx  Hypertension Hyperlipidemia Chronic kidney disease Diabetes mellitus OSA GERD Tobacco abuse Aortic atherosclerosis      Prior CV studies: ECHOCARDIOGRAM 02/06/21 EF 45-50, global HK, moderate LVH, G1 DD, RVSP 15.1, mild LAE, trivial AI, dilated aortic root (37 mm)  ZIO monitor 02/2020 The basic rhythm is normal sinus with an average HR of 59 bpm with baseline IVCD/bundle branch block No atrial fibrillation or flutter No high-grade heart block or pathologic pauses There are frequent PVC's (6% burden) and rare, nonsustained episodes of idioventricular rhythm. There are rare ventricular runs, longest 6 beats There are occasional supraventricular beats (3.7% burden) and rare supraventricular runs, longest lasting 17 seconds   Echocardiogram 01/14/20 EF 50, inf AK , post and inf HK, mod LVH, normal RVSF, RVSP 25.3, mild to mod LAE, trivial MR,    Cardiac catheterization 05/14/2019 ([facility]) LM normal LAD diff irregs LCx diffuse irregs RCA mid 100 (CTO)   Echocardiogram 05/13/2019  ([facility]) Mod conc LVH, inf AK and likely post HK, EF 50-55   Myoview 06/16/15 Intermediate risk stress nuclear study with a large, severe, predominantly fixed inferior lateral defect consistent with prior infarct; no significant ischemia; EF 35 but visually appears better; akinesis of the basal and mid inferior lateral wall; mild LVE; suggest echo to better assess LV function. Study intermediate risk due to reduced LV function.    Carotid US (4/13):  Bilateral: no ICA stenosis   History of Present Illness: [name] [name] was last seen in 8/22.  He was volume overloaded and I adjusted his diuretics and added SGLT2 inhibitor with empagliflozin.  He went to the emergency room several days later with recurrent symptoms of volume excess.  There was noted vascular congestion on chest x-ray.  We adjusted his diuretics further.  He was then admitted 8/28-8/29 with episodes of dizziness with weakness and word finding difficulty.   MRI was negative for acute stroke.  CT was negative for bleed.  Head & neck CTA was negative for significant stenosis.  Neurology recommended dual antiplatelet therapy.  He was also seen by cardiology and no further recommendations were made.  He returns for follow-up.  He is here alone.  Since his admission for the TIA, he has been tired and has been more emotional.  He has not had chest pain.  Overall, his breathing is improved since he started on empagliflozin.  He still gets short of breath with some activities.  He sleeps on 3 pillows.  He has not had lower extremity  edema, syncope.    Past Medical History:  Diagnosis Date   CAD (coronary artery disease)    a. s/p BMS pRCA 2002; b. DES to pRCA & DES p/m RCA 2006; c. PCI/DES OM4 2007; d. PCI/CBA to RCA for ISR 10/2006; e.08/2012 STEMI/Cath/PCI:  LAD 95% >> PCI: Promus Prem DES  //  f. NSTEMI 10/15 >> LHC: mLAD stent ok, dLAD 70, OM1 CTO, OM2 stent ok, dOM2 90, OM3 30-40, dLCx 90, p-mRCA stent ok, mRCA stent ok w/ 60-70 ISR, EF 50% >> med  Rx // MV 1/17: no ischemia // Cath ([facility]) 05/2019: RCA 100 (CTO)    Combined systolic and diastolic CHF    a. Myoview 1/17: EF 35%  //  b. Echo 1/17 EF 45-50% // Echo 05/2019: EF 50-55 // Echocardiogram 8/21: EF 50, inf AK, post and mid inf HK, mod LVH, RVSP 25.3, mild to mod LAE, trivial MR    Depression    Diabetes mellitus ([place])    a. A1c 8.8 08/2012->Metformin initiated. => b. A1c (9/14): 6.6   Gastroesophageal reflux disease    History of nuclear stress test    a. Myoview 1/17: EF 35%, fixed inferior lateral defect consistent with infarct, no ischemia, intermediate risk   History of pneumonia    HTN (hypertension)    Hx of NSTEMI    Hyperlipidemia    Ischemic cardiomyopathy    a. EF 40%; improved to normal;  b. 08/2012 Echo: EF 50-55%, mod LVH.//  c. Echo 9/16: Inf HK, mild LVH, EF 55%, mild LAE, normal RVF, mild RAE, PASP 35 mmHg  //  d. Echo 1/17: EF 45-50%, inferior HK, mild BAE, PASP 33 mmHg   Obesity    OSA (obstructive sleep apnea)    Does not use CPAP as of 05/2011   Tobacco abuse    Current Medications: Current Meds  Medication Sig   Alcohol Swabs (B-D SINGLE USE SWABS BUTTERFLY) PADS Use up to 4 times a day to test blood sugars. Dx: E10.9, E11.9   aspirin 81 MG tablet Take 1 tablet (81 mg total) by mouth daily.   atorvastatin (LIPITOR) 80 MG tablet Take 1 tablet (80 mg total) by mouth at bedtime.   Blood Glucose Calibration (TRUE METRIX LEVEL 1) Low SOLN Use as needed. Dx: E10.9, E11.9   blood glucose meter kit and supplies Dispense based on patient and insurance preference. Use up to four times daily as directed. (FOR ICD-10 E10.9, E11.9).   carvedilol (COREG) 25 MG tablet Take 1 tablet (25 mg total) by mouth 2 (two) times daily with a meal.   cetirizine (ZYRTEC) 10 MG tablet TAKE 1 TABLET BY MOUTH DAILY   clopidogrel (PLAVIX) 75 MG tablet Take 1 tablet (75 mg total) by mouth daily.   empagliflozin (JARDIANCE) 10 MG TABS tablet Take 1 tablet (10 mg total) by mouth daily  before breakfast.   gabapentin (NEURONTIN) 300 MG capsule TAKE 1 CAPSULE(300 MG) BY MOUTH THREE TIMES DAILY   glipiZIDE (GLUCOTROL) 5 MG tablet Take 1 tablet (5 mg total) by mouth 2 (two) times daily.   glucose blood (TRUE METRIX BLOOD GLUCOSE TEST) test strip Use to test blood sugars up to 4 times a day. DX: E10.9, E.11.9   hydrocortisone cream 1 % Apply to affected area 2 times daily   isosorbide mononitrate (IMDUR) 30 MG 24 hr tablet TAKE 3 TABLETS(90 MG) BY MOUTH DAILY   Lancets 33G MISC Use to test blood sugars up to 4 times a day. DX:  E10.9, E11.9   losartan (COZAAR) 50 MG tablet Take 1 tablet (50 mg total) by mouth daily.   nitroGLYCERIN (NITROSTAT) 0.4 MG SL tablet PLACE 1 TABLET UNDER THE TONGUE EVERY 5 MINUTES AS NEEDED FOR CHEST PAIN   pantoprazole (PROTONIX) 40 MG tablet Take 1 tablet (40 mg total) by mouth daily.   PARoxetine (PAXIL) 20 MG tablet Take 1 tablet (20 mg total) by mouth daily.   potassium chloride (KLOR-CON) 10 MEQ tablet Take 2 tablets (20 mEq total) by mouth 2 (two) times daily.   spironolactone (ALDACTONE) 25 MG tablet Take 0.5 tablets (12.5 mg total) by mouth daily.   torsemide (DEMADEX) 20 MG tablet Take 1 tablet (20 mg total) by mouth 2 (two) times daily.   traMADol (ULTRAM) 50 MG tablet TAKE 1 TABLET(50 MG) BY MOUTH EVERY 6 HOURS AS NEEDED   vitamin B-12 (CYANOCOBALAMIN) 1000 MCG tablet Take 1 tablet (1,000 mcg total) by mouth daily.    Allergies:   Penicillin g and Penicillins   Social History   Tobacco Use   Smoking status: Former    Packs/day: 0.50    Years: 30.00    Pack years: 15.00    Types: Cigarettes   Smokeless tobacco: Never   Tobacco comments:    trying to cut down  Vaping Use   Vaping Use: Never used  Substance Use Topics   Alcohol use: No   Drug use: No    Comment: remote marijuana use    Family Hx: The patient's family history includes Cancer in his mother; Coronary artery disease in an other family member; Depression in his mother;  Hypertension in his mother; Other in his father. There is no history of Heart attack or Stroke.  Review of Systems  Psychiatric/Behavioral:         +Emotional    EKGs/Labs/Other Test Reviewed:    EKG:  EKG is  ordered today.  The ekg ordered today demonstrates sinus bradycardia, HR 55, leftward axis, right bundle branch block, QTC 476, no change from prior tracing  Recent Labs: 06/23/2020: TSH 0.86 01/18/2021: NT-Pro BNP 243 02/02/2021: B Natriuretic Peptide 178.9 02/05/2021: ALT 22 02/06/2021: BUN 23; Creatinine, Ser 1.42; Hemoglobin 16.1; Platelets 265; Potassium 3.4; Sodium 139   Recent Lipid Panel Lab Results  Component Value Date/Time   CHOL 140 02/06/2021 03:23 AM   CHOL 105 05/27/2020 08:57 AM   TRIG 84 02/06/2021 03:23 AM   HDL 38 (L) 02/06/2021 03:23 AM   HDL 48 05/27/2020 08:57 AM   LDLCALC 85 02/06/2021 03:23 AM   LDLCALC 45 05/27/2020 08:57 AM     Risk Assessment/Calculations:          Physical Exam:    VS:  BP (!) 180/100   Pulse (!) 55   Ht $R'6\' 1"'Zj$  (1.854 m)   Wt (!) 314 lb 9.6 oz (142.7 kg)   SpO2 96%   BMI 41.51 kg/m     Wt Readings from Last 3 Encounters:  03/03/21 (!) 314 lb 9.6 oz (142.7 kg)  02/06/21 (!) 318 lb (144.2 kg)  02/02/21 (!) 318 lb (144.2 kg)    Constitutional:      Appearance: Healthy appearance. Not in distress.  Neck:     Vascular: JVD normal.  Pulmonary:     Effort: Pulmonary effort is normal.     Breath sounds: No wheezing. No rales.  Cardiovascular:     Normal rate. Regular rhythm. Normal S1. Normal S2.      Murmurs: There is no  murmur.  Edema:    Peripheral edema absent.  Abdominal:     Palpations: Abdomen is soft. There is no hepatomegaly.  Skin:    General: Skin is warm and dry.  Neurological:     General: No focal deficit present.     Mental Status: Alert and oriented to person, place and time.     Cranial Nerves: Cranial nerves are intact.       ASSESSMENT & PLAN:   1. Coronary artery disease involving native  coronary artery of native heart without angina pectoris Status post multiple PCI procedures in the setting of ACS and ST elevation myocardial infarctions.   Cardiac catheterization December 2020 with chronically occluded RCA and no significant obstructive disease in the LCx or LAD.   He has not had any chest discomfort to suggest angina.  Continue aspirin 81 mg daily, atorvastatin 80 mg daily, clopidogrel 75 mg daily, carvedilol 25 mg twice daily, isosorbide mononitrate 90 mg daily.  2. Chronic heart failure with preserved ejection fraction (HCC) EF 45-50 by echocardiogram done last month when admitted for TIA.  Volume status overall appears stable.  He is NYHA IIb.  Continue empagliflozin 10 mg daily, torsemide 20 mg twice daily, spironolactone 12.5 mg daily.  3. PVC's (premature ventricular contractions) Continue carvedilol 25 mg twice daily.  4. SVT (supraventricular tachycardia) (HCC) Continue carvedilol 25 mg twice daily.    5. CKD (chronic kidney disease), stage II Recent creatinine stable.  His blood pressure is uncontrolled.  I will place him on losartan 50 mg daily.  He will need close follow-up on renal function.  I will repeat a BMET again in 2 weeks.  6. Essential hypertension As noted, his blood pressure is uncontrolled.  Repeat creatinine was 164/96.  He notes he did smoke a cigarette prior to coming in today.  Start losartan 50 mg daily.  Obtain follow-up BMET in 2 weeks.  Continue carvedilol 20 mg twice daily, isosorbide 90 mg daily, spironolactone 12.5 mg daily.  7. Mixed hyperlipidemia Continue atorvastatin 80 mg daily.  8. History of TIA (transient ischemic attack) I will make sure he has follow-up with neurology.  Dr. Audie Box saw him for cardiology in the hospital and mentioned getting an event monitor.  I will place him on a ZIO AT for 14 days.      Dispo:  Return in about 3 months (around 06/02/2021) for Routine Follow Up, w/ [name], PA-C.   Medication  Adjustments/Labs and Tests Ordered: Current medicines are reviewed at length with the patient today.  Concerns regarding medicines are outlined above.  Tests Ordered: Orders Placed This Encounter  Procedures   Basic Metabolic Panel (BMET)   LONG TERM MONITOR-LIVE TELEMETRY (3-14 DAYS)   EKG 12-Lead    Medication Changes: Meds ordered this encounter  Medications   losartan (COZAAR) 50 MG tablet    Sig: Take 1 tablet (50 mg total) by mouth daily.    Dispense:  90 tablet    Refill:  3    Signed, [name], PA-C  03/03/2021 12:46 PM    [facility] [address], [place], [place]  [address] Phone: [phone]; Fax: [phone]

## 2021-03-03 ENCOUNTER — Other Ambulatory Visit: Payer: Self-pay

## 2021-03-03 ENCOUNTER — Encounter: Payer: Self-pay | Admitting: Physician Assistant

## 2021-03-03 ENCOUNTER — Ambulatory Visit (INDEPENDENT_AMBULATORY_CARE_PROVIDER_SITE_OTHER): Payer: Medicare HMO | Admitting: Physician Assistant

## 2021-03-03 ENCOUNTER — Ambulatory Visit (INDEPENDENT_AMBULATORY_CARE_PROVIDER_SITE_OTHER): Payer: Medicare HMO

## 2021-03-03 ENCOUNTER — Telehealth: Payer: Self-pay | Admitting: Lab

## 2021-03-03 VITALS — BP 180/100 | HR 55 | Ht 73.0 in | Wt 314.6 lb

## 2021-03-03 DIAGNOSIS — I471 Supraventricular tachycardia: Secondary | ICD-10-CM | POA: Diagnosis not present

## 2021-03-03 DIAGNOSIS — I10 Essential (primary) hypertension: Secondary | ICD-10-CM | POA: Diagnosis not present

## 2021-03-03 DIAGNOSIS — I493 Ventricular premature depolarization: Secondary | ICD-10-CM

## 2021-03-03 DIAGNOSIS — I2510 Atherosclerotic heart disease of native coronary artery without angina pectoris: Secondary | ICD-10-CM | POA: Diagnosis not present

## 2021-03-03 DIAGNOSIS — N182 Chronic kidney disease, stage 2 (mild): Secondary | ICD-10-CM | POA: Diagnosis not present

## 2021-03-03 DIAGNOSIS — Z8673 Personal history of transient ischemic attack (TIA), and cerebral infarction without residual deficits: Secondary | ICD-10-CM | POA: Diagnosis not present

## 2021-03-03 DIAGNOSIS — E782 Mixed hyperlipidemia: Secondary | ICD-10-CM | POA: Diagnosis not present

## 2021-03-03 DIAGNOSIS — I5032 Chronic diastolic (congestive) heart failure: Secondary | ICD-10-CM | POA: Diagnosis not present

## 2021-03-03 DIAGNOSIS — Z79899 Other long term (current) drug therapy: Secondary | ICD-10-CM | POA: Diagnosis not present

## 2021-03-03 MED ORDER — LOSARTAN POTASSIUM 50 MG PO TABS: 50.0000 mg | ORAL_TABLET | Freq: Every day | ORAL | 3 refills | Status: DC

## 2021-03-03 NOTE — Patient Instructions (Signed)
Medication Instructions:  Start Losartan 50 mg daily   *If you need a refill on your cardiac medications before your next appointment, please call your pharmacy*   Lab Work:   BMET in 2 weeks   If you have labs (blood work) drawn today and your tests are completely normal, you will receive your results only by: Loop (if you have MyChart) OR A paper copy in the mail If you have any lab test that is abnormal or we need to change your treatment, we will call you to review the results.   Testing/Procedures: ZIO AT Long term monitor-Live Telemetry  Your physician has requested you wear a ZIO patch monitor for 14 days.  This is a single patch monitor. [employer] supplies one patch monitor per enrollment. Additional  stickers are not available.  Please do not apply patch if you will be having a Nuclear Stress Test, Echocardiogram, Cardiac CT, MRI,  or Chest Xray during the period you would be wearing the monitor. The patch cannot be worn during  these tests. You cannot remove and re-apply the ZIO AT patch monitor.  Your ZIO patch monitor will be mailed 3 day USPS to your address on file. It may take 3-5 days to  receive your monitor after you have been enrolled.  Once you have received your monitor, please review the enclosed instructions. Your monitor has  already been registered assigning a specific monitor serial # to you.   Billing and Patient Assistance Program information  [name] [name] has been supplied with any insurance information on record for billing. [employer] offers a sliding scale Patient Assistance Program for patients without insurance, or whose  insurance does not completely cover the cost of the ZIO patch monitor. You must apply for the  Patient Assistance Program to qualify for the discounted rate. To apply, call [employer] at [phone],  select option 4, select option 2 , ask to apply for the Patient Assistance Program, (you can request an  interpreter if needed).  [employer] will ask your household income and how many people are in your  household. [employer] will quote your out-of-pocket cost based on this information. They will also be able  to set up a 12 month interest free payment plan if needed.  Applying the monitor   Shave hair from upper left chest.  Hold the abrader disc by orange tab. Rub the abrader in 40 strokes over left upper chest as indicated in  your monitor instructions.  Clean area with 4 enclosed alcohol pads. Use all pads to ensure the area is cleaned thoroughly. Let  dry.  Apply patch as indicated in monitor instructions. Patch will be placed under collarbone on left side of  chest with arrow pointing upward.  Rub patch adhesive wings for 2 minutes. Remove the white label marked "1". Remove the white label  marked "2". Rub patch adhesive wings for 2 additional minutes.  While looking in a mirror, press and release button in center of patch. A small green light will flash 3-4  times. This will be your only indicator that the monitor has been turned on.  Do not shower for the first 24 hours. You may shower after the first 24 hours.  Press the button if you feel a symptom. You will hear a small click. Record Date, Time and Symptom in  the Patient Log.   Starting the Gateway  In your kit there is a Hydrographic surveyor box the size of a cellphone. This is Airline pilot. It  transmits all your  recorded data to [employer]. This box must always stay within 10 feet of you. Open the box and push the *  button. There will be a light that blinks orange and then green a few times. When the light stops  blinking, the Gateway is connected to the ZIO patch. Call [employer] at [phone] to confirm your monitor is transmitting.  Returning your monitor  Remove your patch and place it inside the [place]. In the lower half of the Gateway there is a white  bag with prepaid postage on it. Place Gateway in bag and seal. Mail package back to [place] as soon as   possible. Your physician should have your final report approximately 7 days after you have mailed back  your monitor. Call [place] at [phone] if you have questions regarding your ZIO AT  patch monitor. Call them immediately if you see an orange light blinking on your monitor.  If your monitor falls off in less than 4 days, contact our Monitor department at [phone]. If your  monitor becomes loose or falls off after 4 days call [employer] at [phone] for suggestions on  securing your monitor    Follow-Up: At [facility], you and your health needs are our priority.  As part of our continuing mission to provide you with exceptional heart care, we have created designated Provider Care Teams.  These Care Teams include your primary Cardiologist (physician) and Advanced Practice Providers (APPs -  Physician Assistants and Nurse Practitioners) who all work together to provide you with the care you need, when you need it.  We recommend signing up for the patient portal called "MyChart".  Sign up information is provided on this After Visit Summary.  MyChart is used to connect with patients for Virtual Visits (Telemedicine).  Patients are able to view lab/test results, encounter notes, upcoming appointments, etc.  Non-urgent messages can be sent to your provider as well.   To learn more about what you can do with MyChart, go to [email or web address].    Your next appointment:   3 month(s)  The format for your next appointment:   In Person  Provider:   You will see one of the following Advanced Practice Providers on your designated Care Team:   [name], [place]    Other Instructions  Need follow up appt with Dr. [name] for TIA (Saw Dr. [name] in the hospital)

## 2021-03-03 NOTE — Chronic Care Management (AMB) (Signed)
  Chronic Care Management   Note  03/03/2021 Name: Jayesh Marbach MRN: [id number] DOB: July 30, 1955  Mikah Poss is a 65 y.o. year old male who is a primary care patient of Biagio Borg, MD. I reached out to [name] by phone today in response to a referral sent by Mr. [name] Driver's PCP, Biagio Borg, MD.   Mr. Shipper was given information about Chronic Care Management services today including:  CCM service includes personalized support from designated clinical staff supervised by his physician, including individualized plan of care and coordination with other care providers 24/7 contact phone numbers for assistance for urgent and routine care needs. Service will only be billed when office clinical staff spend 20 minutes or more in a month to coordinate care. Only one practitioner may furnish and bill the service in a calendar month. The patient may stop CCM services at any time (effective at the end of the month) by phone call to the office staff.   Patient agreed to services and verbal consent obtained.   Follow up plan: [place]

## 2021-03-03 NOTE — Progress Notes (Unsigned)
Enrolled patient for a 14 day Zio AT monitor to be mailed to patients home   Dr. [name] to read

## 2021-03-08 DIAGNOSIS — I493 Ventricular premature depolarization: Secondary | ICD-10-CM

## 2021-03-08 DIAGNOSIS — Z8673 Personal history of transient ischemic attack (TIA), and cerebral infarction without residual deficits: Secondary | ICD-10-CM | POA: Diagnosis not present

## 2021-03-08 DIAGNOSIS — I471 Supraventricular tachycardia: Secondary | ICD-10-CM

## 2021-03-09 ENCOUNTER — Telehealth: Payer: Self-pay | Admitting: Physician Assistant

## 2021-03-09 DIAGNOSIS — I471 Supraventricular tachycardia: Secondary | ICD-10-CM | POA: Diagnosis not present

## 2021-03-09 DIAGNOSIS — Z8673 Personal history of transient ischemic attack (TIA), and cerebral infarction without residual deficits: Secondary | ICD-10-CM | POA: Diagnosis not present

## 2021-03-09 DIAGNOSIS — I493 Ventricular premature depolarization: Secondary | ICD-10-CM | POA: Diagnosis not present

## 2021-03-09 NOTE — Telephone Encounter (Signed)
[name] [name] with [employer] calling to discuss clinical information for an authorization for the heart monitor. She states the call back is time sensitive. Phone: [phone] tracking number: [id number]

## 2021-03-09 NOTE — Telephone Encounter (Signed)
Asking why monitor ordered:   History of TIA (transient ischemic attack), looking to rule out Afib as cause.   Asking if heart monitor was worn in hospital:   Telemetry: Sinus rhythm in the 70s  Verbal auth number: [id number] Sept 23 to Oct 23

## 2021-03-10 DIAGNOSIS — I5042 Chronic combined systolic (congestive) and diastolic (congestive) heart failure: Secondary | ICD-10-CM | POA: Diagnosis not present

## 2021-03-10 DIAGNOSIS — E1159 Type 2 diabetes mellitus with other circulatory complications: Secondary | ICD-10-CM | POA: Diagnosis not present

## 2021-03-17 ENCOUNTER — Other Ambulatory Visit: Payer: Medicare HMO | Admitting: *Deleted

## 2021-03-17 ENCOUNTER — Other Ambulatory Visit: Payer: Self-pay

## 2021-03-17 DIAGNOSIS — N182 Chronic kidney disease, stage 2 (mild): Secondary | ICD-10-CM

## 2021-03-17 DIAGNOSIS — Z79899 Other long term (current) drug therapy: Secondary | ICD-10-CM

## 2021-03-17 DIAGNOSIS — I10 Essential (primary) hypertension: Secondary | ICD-10-CM

## 2021-03-17 LAB — BASIC METABOLIC PANEL
BUN/Creatinine Ratio: 15 (ref 10–24)
BUN: 23 mg/dL (ref 8–27)
CO2: 27 mmol/L (ref 20–29)
Calcium: 9.9 mg/dL (ref 8.6–10.2)
Chloride: 104 mmol/L (ref 96–106)
Creatinine, Ser: 1.57 mg/dL — ABNORMAL HIGH (ref 0.76–1.27)
Glucose: 132 mg/dL — ABNORMAL HIGH (ref 70–99)
Potassium: 4 mmol/L (ref 3.5–5.2)
Sodium: 143 mmol/L (ref 134–144)
eGFR: 49 mL/min/{1.73_m2} — ABNORMAL LOW (ref >59)

## 2021-04-04 ENCOUNTER — Ambulatory Visit (INDEPENDENT_AMBULATORY_CARE_PROVIDER_SITE_OTHER): Payer: Medicare HMO | Admitting: *Deleted

## 2021-04-04 DIAGNOSIS — E1159 Type 2 diabetes mellitus with other circulatory complications: Secondary | ICD-10-CM

## 2021-04-04 DIAGNOSIS — I5042 Chronic combined systolic (congestive) and diastolic (congestive) heart failure: Secondary | ICD-10-CM

## 2021-04-04 NOTE — Patient Instructions (Addendum)
Visit Information  [name] [name], it was nice talking with you today.   Please read over the attached information, and take steps to obtain your flu vaccine if you have not already done so As we discussed today, I have asked the cardiology PA Nicki Reaper) to follow up with you about the neurology (stroke) doctor referral that he placed on March 03, 2021 __________________________________________________________________________________________  Your upcoming appointments are listed below, and are as follows:  -- In-person visit with Kerney Elbe at the [facility] on Monday, April 24, 2021 at 9:00 am: please be at the office at 8:45 am and bring your medications, your pill box, and any questions you have with you.  Tell the pharmacist you still need a blood sugar monitor for checking your blood sugars at home   -- Wednesday, May 03, 2021: podiatry (foot doctor) appointment at 9:30 am  -- Wednesday, May 24, 2021: cardiology (heart/ blood pressure doctor) appointment at 10:55 am  -- Monday, May 29, 2021: Telephone call at 9:00 am- from [place], Nurse Care manager  -- Monday, July 03, 2020: Primary Care/ Dr. [name]-- in-person office visit at the [facility] at 8:00 am- please be at the office at 7:45 am   _________________________________________________________________________________________  I look forward to talking to you again for an update by telephone on Monday, May 29, 2021 at 9:00 am- please be listening out for my call that day.  I will call as close to 9:00 am / pm as possible.   If you need to cancel or re-schedule our telephone visit, please call [phone] and one of our care guides will be happy to assist you.  I look forward to hearing about your progress.   Please don't hesitate to contact me if I can be of assistance to you before our next scheduled telephone appointment.   [name], RN, BSN, [facility] RN  Care Coordination- [place] [phone]: direct office [phone]: mobile   PATIENT GOALS:  Goals Addressed             This Visit's Progress    Patient Self-Care Activities   On track    Timeframe:  Long-Range Goal Priority:  High Start Date:      01/24/21                       Expected End Date:   01/24/22                    Patient will self administer medications as prescribed Patient will attend all scheduled provider appointments Patient will call pharmacy for medication refills Patient will call provider office for new concerns or questions Once a glucometer (blood sugar meter) is obtained, patient will begin checking and writing down on paper blood sugars first thing in the morning before eating and then again during the day 2 hours after eating a regular meal Once scales are obtained, patient will begin monitoring and writing down on paper weights at home every day Patient will obtain a flu vaccine for the 2022-2023 flu season- please take steps to obtain your flu vaccine if you have not already done so Patient will attend [facility] in-person office visit on Monday, April 24, 2021 at 9:00-- please be at the office at 8:45 am         Influenza, Adult Influenza is also called "the flu." It is an infection in the lungs, nose, and throat (respiratory  tract). It spreads easily from person to person (is contagious). The flu causes symptoms that are like a cold, along with high fever and body aches. What are the causes? This condition is caused by the influenza virus. You can get the virus by: Breathing in droplets that are in the air after a person infected with the flu coughed or sneezed. Touching something that has the virus on it and then touching your mouth, nose, or eyes. What increases the risk? Certain things may make you more likely to get the flu. These include: Not washing your hands often. Having close contact with many people during cold and  flu season. Touching your mouth, eyes, or nose without first washing your hands. Not getting a flu shot every year. You may have a higher risk for the flu, and serious problems, such as a lung infection (pneumonia), if you: Are older than 65. Are pregnant. Have a weakened disease-fighting system (immune system) because of a disease or because you are taking certain medicines. Have a long-term (chronic) condition, such as: Heart, kidney, or lung disease. Diabetes. Asthma. Have a liver disorder. Are very overweight (morbidly obese). Have anemia. What are the signs or symptoms? Symptoms usually begin suddenly and last 4-14 days. They may include: Fever and chills. Headaches, body aches, or muscle aches. Sore throat. Cough. Runny or stuffy (congested) nose. Feeling discomfort in your chest. Not wanting to eat as much as normal. Feeling weak or tired. Feeling dizzy. Feeling sick to your stomach or throwing up. How is this treated? If the flu is found early, you can be treated with antiviral medicine. This can help to reduce how bad the illness is and how long it lasts. This may be given by mouth or through an IV tube. Taking care of yourself at home can help your symptoms get better. Your doctor may want you to: Take over-the-counter medicines. Drink plenty of fluids. The flu often goes away on its own. If you have very bad symptoms or other problems, you may be treated in a hospital. Follow these instructions at home:   Activity Rest as needed. Get plenty of sleep. Stay home from work or school as told by your doctor. Do not leave home until you do not have a fever for 24 hours without taking medicine. Leave home only to go to your doctor. Eating and drinking Take an ORS (oral rehydration solution). This is a drink that is sold at pharmacies and stores. Drink enough fluid to keep your pee pale yellow. Drink clear fluids in small amounts as you are able. Clear fluids  include: Water. Ice chips. Fruit juice mixed with water. Low-calorie sports drinks. Eat bland foods that are easy to digest. Eat small amounts as you are able. These foods include: Bananas. Applesauce. Rice. Lean meats. Toast. Crackers. Do not eat or drink: Fluids that have a lot of sugar or caffeine. Alcohol. Spicy or fatty foods. General instructions Take over-the-counter and prescription medicines only as told by your doctor. Use a cool mist humidifier to add moisture to the air in your home. This can make it easier for you to breathe. When using a cool mist humidifier, clean it daily. Empty water and replace with clean water. Cover your mouth and nose when you cough or sneeze. Wash your hands with soap and water often and for at least 20 seconds. This is also important after you cough or sneeze. If you cannot use soap and water, use alcohol-based hand sanitizer. Keep all follow-up  visits. How is this prevented?  Get a flu shot every year. You may get the flu shot in late summer, fall, or winter. Ask your doctor when you should get your flu shot. Avoid contact with people who are sick during fall and winter. This is cold and flu season. Contact a doctor if: You get new symptoms. You have: Chest pain. Watery poop (diarrhea). A fever. Your cough gets worse. You start to have more mucus. You feel sick to your stomach. You throw up. Get help right away if you: Have shortness of breath. Have trouble breathing. Have skin or nails that turn a bluish color. Have very bad pain or stiffness in your neck. Get a sudden headache. Get sudden pain in your face or ear. Cannot eat or drink without throwing up. These symptoms may represent a serious problem that is an emergency. Get medical help right away. Call your local emergency services (911 in the [place] Do not wait to see if the symptoms will go away. Do not drive yourself to the hospital. Summary Influenza is also called  "the flu." It is an infection in the lungs, nose, and throat. It spreads easily from person to person. Take over-the-counter and prescription medicines only as told by your doctor. Getting a flu shot every year is the best way to not get the flu. This information is not intended to replace advice given to you by your health care provider. Make sure you discuss any questions you have with your health care provider. Document Revised: 01/15/2020 Document Reviewed: 01/15/2020 Elsevier Patient Education  [place].   The patient verbalized understanding of instructions, educational materials, and care plan provided today and agreed to receive a mailed copy of patient instructions, educational materials, and care plan Telephone follow up appointment with care management team member scheduled for:  Monday, May 29, 2021 at 9:00 am The patient has been provided with contact information for the care management team and has been advised to call with any health related questions or concerns   [name], RN, BSN, [place] [phone]: direct office [phone]: mobile

## 2021-04-04 NOTE — Chronic Care Management (AMB) (Signed)
Chronic Care Management   CCM RN Visit Note  04/04/2021 Name: [name] [name] MRN: [id number] DOB: [birth year]  Subjective: [name] [name] is a 65 y.o. year old male who is a primary care patient of Biagio Borg, MD. The care management team was consulted for assistance with disease management and care coordination needs.    Engaged with patient by telephone for follow up visit in response to provider referral for case management and/or care coordination services.   Consent to Services:  The patient was given information about Chronic Care Management services, agreed to services, and gave verbal consent prior to initiation of services.  Please see initial visit note for detailed documentation.  Patient agreed to services and verbal consent obtained.   Assessment: Review of patient past medical history, allergies, medications, health status, including review of consultants reports, laboratory and other test data, was performed as part of comprehensive evaluation and provision of chronic care management services.   CCM Care Plan Allergies  Allergen Reactions   Penicillin G Other (See Comments)    Did it involve swelling of the face/tongue/throat, SOB, or low BP?  N/A Did it involve sudden or severe rash/hives, skin peeling, or any reaction on the inside of your mouth or nose? N/A Did you need to seek medical attention at a hospital or doctor's office? N/A When did it last happen? Child If all above answers are "NO", may proceed with cephalosporin use.   Penicillins Other (See Comments)    Did it involve swelling of the face/tongue/throat, SOB, or low BP? N/A Did it involve sudden or severe rash/hives, skin peeling, or any reaction on the inside of your mouth or nose?N/A Did you need to seek medical attention at a hospital or doctor's office? N/A When did it last happen? Child      If all above answers are "NO", may proceed with cephalosporin use.   Outpatient Encounter Medications as of  04/04/2021  Medication Sig   Alcohol Swabs (B-D SINGLE USE SWABS BUTTERFLY) PADS Use up to 4 times a day to test blood sugars. Dx: E10.9, E11.9   aspirin 81 MG tablet Take 1 tablet (81 mg total) by mouth daily.   atorvastatin (LIPITOR) 80 MG tablet Take 1 tablet (80 mg total) by mouth at bedtime.   Blood Glucose Calibration (TRUE METRIX LEVEL 1) Low SOLN Use as needed. Dx: E10.9, E11.9   blood glucose meter kit and supplies Dispense based on patient and insurance preference. Use up to four times daily as directed. (FOR ICD-10 E10.9, E11.9).   carvedilol (COREG) 25 MG tablet Take 1 tablet (25 mg total) by mouth 2 (two) times daily with a meal.   cetirizine (ZYRTEC) 10 MG tablet TAKE 1 TABLET BY MOUTH DAILY   clopidogrel (PLAVIX) 75 MG tablet Take 1 tablet (75 mg total) by mouth daily.   empagliflozin (JARDIANCE) 10 MG TABS tablet Take 1 tablet (10 mg total) by mouth daily before breakfast.   gabapentin (NEURONTIN) 300 MG capsule TAKE 1 CAPSULE(300 MG) BY MOUTH THREE TIMES DAILY   glipiZIDE (GLUCOTROL) 5 MG tablet Take 1 tablet (5 mg total) by mouth 2 (two) times daily.   glucose blood (TRUE METRIX BLOOD GLUCOSE TEST) test strip Use to test blood sugars up to 4 times a day. DX: E10.9, E.11.9   hydrocortisone cream 1 % Apply to affected area 2 times daily   isosorbide mononitrate (IMDUR) 30 MG 24 hr tablet TAKE 3 TABLETS(90 MG) BY MOUTH DAILY   Lancets 33G MISC  Use to test blood sugars up to 4 times a day. DX: E10.9, E11.9   losartan (COZAAR) 50 MG tablet Take 1 tablet (50 mg total) by mouth daily.   nitroGLYCERIN (NITROSTAT) 0.4 MG SL tablet PLACE 1 TABLET UNDER THE TONGUE EVERY 5 MINUTES AS NEEDED FOR CHEST PAIN   pantoprazole (PROTONIX) 40 MG tablet Take 1 tablet (40 mg total) by mouth daily.   PARoxetine (PAXIL) 20 MG tablet Take 1 tablet (20 mg total) by mouth daily.   potassium chloride (KLOR-CON) 10 MEQ tablet Take 2 tablets (20 mEq total) by mouth 2 (two) times daily.   spironolactone  (ALDACTONE) 25 MG tablet Take 0.5 tablets (12.5 mg total) by mouth daily.   torsemide (DEMADEX) 20 MG tablet Take 1 tablet (20 mg total) by mouth 2 (two) times daily.   traMADol (ULTRAM) 50 MG tablet TAKE 1 TABLET(50 MG) BY MOUTH EVERY 6 HOURS AS NEEDED   vitamin B-12 (CYANOCOBALAMIN) 1000 MCG tablet Take 1 tablet (1,000 mcg total) by mouth daily.   No facility-administered encounter medications on file as of 04/04/2021.   Patient Active Problem List   Diagnosis Date Noted   TIA (transient ischemic attack) 02/05/2021   Aortic atherosclerosis ([place]) 12/26/2020   Cough 12/26/2020   Vitamin D deficiency 04/03/2020   B12 deficiency 04/03/2020   Itching 11/22/2019   Chronic heart failure with preserved ejection fraction (HFpEF) ([place]) 05/13/2019   Chronic coronary artery disease 05/13/2019   Elevated troponin 05/13/2019   Morbid obesity with BMI of 40.0-44.9, adult ([place]) 05/13/2019   Pre-ulcerative calluses 02/03/2019   Foot injury, left, initial encounter 11/26/2018   Chest pain 05/30/2017   Hypokalemia 05/30/2017   Allergic rhinitis 12/18/2016   Lumbar radiculitis 09/05/2016   Lumbar spinal stenosis 09/05/2016   CKD (chronic kidney disease), stage III ([place]) 12/29/2015   Chest pain syndrome 12/15/2015   Acute kidney injury ([place]) 12/15/2015   Atypical chest pain 12/15/2015   Encounter for well adult exam with abnormal findings 06/27/2015   Chronic low back pain 06/27/2015   Left lumbar radiculopathy 04/13/2015   Bilateral plantar fasciitis 04/13/2015   Essential hypertension 03/09/2015   OSA (obstructive sleep apnea) 03/27/2014   Peripheral neuropathy ([place]) 12/29/2013   External hemorrhoid, thrombosed 12/03/2013   Sinus bradycardia 11/15/2013   NSTEMI (non-ST elevated myocardial infarction) ([place]) 11/14/2013   Chronic combined systolic and diastolic CHF  44/81/8563   CAD S/P multiple PCI    Hyperlipidemia    DM2 (diabetes mellitus, type 2) ([place]) 06/26/2013   Gait disorder  06/26/2013   Obesity, Class III, BMI 40-49.9 (morbid obesity) ([place]) 09/10/2012   Tobacco abuse 09/02/2012   Ischemic cardiomyopathy- EF 50-55% June 2015    ST elevation myocardial infarction (STEMI) of anterior wall, initial episode of care ([place]) 09/01/2012   HEMORRHOIDS 12/26/2009   Acute prostatitis 12/26/2009   GANGLION CYST, WRIST, RIGHT 11/28/2009   COLONIC POLYPS 10/07/2009   Anxiety with depression 07/21/2009   DYSPNEA 03/24/2009   GASTROESOPHAGEAL REFLUX DISEASE 01/27/2007   PERCUTANEOUS TRANSLUMINAL CORONARY ANGIOPLASTY, HX OF 02/09/2006   Conditions to be addressed/monitored:  CHF and DMII  Care Plan : RN Care Manager Plan of Care  Updates made by Knox Royalty, RN since 04/04/2021 12:00 AM     Problem: Chronic Disease Management Needs   Priority: High     Long-Range Goal: Development of plan of care for long term chronic disease management   Start Date: 01/25/2021  Expected End Date: 01/25/2022  Priority: High  Note:  Current Barriers:  Chronic Disease Management support and education needs related to CHF and DMII Literacy barriers- patient reports needs assistance understanding health care advice Has not obtained glucometer/ scales from insurance provider: confirmed patient has plan in place to obtain these items: 02/09/21: reports still waiting on items to arrive; 03/01/21: patient reports has called insurance company and PCP office to facilitate obtaining glucometer- has not heard back from either-- will place [facility] referral- see notes in blue text below; reports has still not obtained new scales (reports scales he had received previously from insurance company are not accurate Recent hospitalization (observation) for TIA August 28-29, 2022  RNCM Clinical Goal(s):  Patient will demonstrate improved adherence to prescribed treatment plan for CHF and DMII as evidenced by daily monitoring and recording of CBG  adherence to ADA/ carb modified diet adherence to  prescribed medication regimen contacting provider for new or worsened symptoms or questions monitoring and recording daily weights at home    through collaboration with RN Care manager, provider, and care team.   Interventions: 1:1 collaboration with primary care provider regarding development and update of comprehensive plan of care as evidenced by provider attestation and co-signature Inter-disciplinary care team collaboration (see longitudinal plan of care) Evaluation of current treatment plan related to  self management and patient's adherence to plan as established by provider  Heart Failure Interventions:  (Status: Goal on track: YES.) Assessed need for readable accurate scales in home; Discussed importance of daily weight and advised patient to weigh and record daily; Reviewed role of diuretics in prevention of fluid overload and management of heart failure; Discussed the importance of keeping all appointments with provider; Advised patient to discuss his medications with [facility] Pharmacist at upcoming 04/24/21 in person appointment: he has questions about the diuretic aspects of all of his medications with provider; Patient reports he has still been unable to buy new scales, nor has he been provided scales from insurance provider: education provided around monitoring fluid status through signs/ symptoms at home: he denies lower extremity swelling, reports baseline shortness of breath with activity; he is out walking during our conversation and is in no apparent/ obvious distress while walking/ talking Reviewed recent cardiology appointment- confirms he has started taking newly prescribed medication for HTN (losartan); he questions whether neurology referral was placed at time of cardiology appointment- I noted through review of office visit notes, that it appears the referral was made, but I am unable to confirm through referral review: will place care coordination note to cardiology provider to  follow up Confirms he has continues managing filling pill box at home: he remains challenged, but states it is "somewhat better" and feels he is filling pill box correctly  Diabetes:  (Status: Goal on track: YES.) Lab Results  Component Value Date   HGBA1C 12.1 Repeated and verified X2. (H) 12/26/2020  Discussed plans with patient for ongoing care management follow up and provided patient with direct contact information for care management team;      Patient again reports has not yet received necessary supplies for blood sugar monitoring at home- states he has "not heard a word" from insurance company/ PCP office; discussed with patient his upcoming in-person appointment with [facility] Pharmacist: Monday April 24, 2021: instructed patient to be at appointment at 8:45 am, to bring his medications, and any questions he has about his medications to appointment; he reports today that he has continues using weekly pill box, although it continues to challenge him- he states "it's getting  a little better" Reports no recent episode of signs/ symptoms hypoglycemia, as reported at time of CCM RN CM 03/01/21 outreach: we reviewed signs/ symptoms hypoglycemia along with corresponding action plan; patient will need ongoing support and reinforcement of same Reviewed historical A1-C values with patient: 6.7 (06/23/20)----> 12.1 (12/26/20)---> 10.6 (02/06/21); provided general education around correlation of A1-C values to average blood sugars at home Reviewed all upcoming provider appointments with patient: 05/03/21- podiatry; 04/24/21- [facility]: office visit; 05/24/21- cardiology; 07/03/21- PCP Confirmed no new/ recent falls- patient continues to use cane prn  Patient Goals/Self-Care Activities: Patient will self administer medications as prescribed Patient will attend all scheduled provider appointments Patient will call pharmacy for medication refills Patient will call provider office for new concerns or  questions Once a glucometer (blood sugar meter) is obtained, patient will begin checking and writing down on paper blood sugars first thing in the morning before eating and then again during the day 2 hours after eating a regular meal Once scales are obtained, patient will begin monitoring and writing down on paper weights at home every day Patient will obtain a flu vaccine for the 2022-2023 flu season- please take steps to obtain your flu vaccine if you have not already done so Patient will attend [facility] in-person office visit on Monday, April 24, 2021 at 9:00-- please be at the office at 8:45 am          Plan: Telephone follow up appointment with care management team member scheduled for:  Monday, May 29, 2021 at 9:00 am The patient has been provided with contact information for the care management team and has been advised to call with any health related questions or concerns  [name], RN, BSN, [place] [phone]: direct office [phone]: mobile

## 2021-04-05 ENCOUNTER — Other Ambulatory Visit: Payer: Self-pay | Admitting: *Deleted

## 2021-04-05 DIAGNOSIS — Z8673 Personal history of transient ischemic attack (TIA), and cerebral infarction without residual deficits: Secondary | ICD-10-CM

## 2021-04-10 DIAGNOSIS — I5042 Chronic combined systolic (congestive) and diastolic (congestive) heart failure: Secondary | ICD-10-CM | POA: Diagnosis not present

## 2021-04-10 DIAGNOSIS — E1159 Type 2 diabetes mellitus with other circulatory complications: Secondary | ICD-10-CM | POA: Diagnosis not present

## 2021-04-17 ENCOUNTER — Other Ambulatory Visit: Payer: Self-pay | Admitting: Physician Assistant

## 2021-04-19 ENCOUNTER — Telehealth: Payer: Self-pay | Admitting: Pharmacist

## 2021-04-19 NOTE — Progress Notes (Signed)
Chronic Care Management Pharmacy Assistant   Name: [name] [name]  MRN: [id number] DOB: Feb 01, 1956  Reason for Encounter: Initial Visit Appointment: [place] 04/24/21 @ 9 am  Recent office visits:  03/10/21 [name] (PCP) - Chronic combined systolic (congestive) and diastolic (congestive) heart failure. No med changes.  12/26/20 [name] (PCP) - Cough. No med changes.  Recent consult visits:  03/03/21 [name] (Cardiology) - Coronary artery disease involving native coronary artery of native heart without angina pectoris. Start Losartan Pot 50 mg. EKG 12-lead.  01/18/21 [name] (Cardiology) - Acute on chronic heart failure with preserved ejection fraction. Start Jardiance 10 mg. Decrease Gabapentin 300 mg to 2x daily. Increase Torsemide to 30 mg. D/c Prednisone 20 mg. EKG 12-lead.  11/15/20 [name] (Audiology) - Sensorineural hearing loss, bilateral.  11/02/20 [name] (Podiatry) - Pain due to onychomycosis of toenails of both feet. No med changes.    Hospital visits:  Medication Reconciliation was completed by comparing discharge summary, patient's EMR and Pharmacy list, and upon discussion with patient.  Admitted to the hospital on 02/05/21 due to TIA. Discharge date was 8/29. Discharged from [facility]:?? START taking: atorvastatin (LIPITOR) Start taking on: February 07, 2021 glipiZIDE (GLUCOTROL)  Medications Discontinued at [facility] Discharge: STOP taking: rosuvastatin 40 MG tablet (CRESTOR)  Medications that remain the same after Hospital Discharge:??  -All other medications will remain the same.    02/02/21 [name] ED) Acute on chronic congestive heart failure, unspecified heart failure type. Start Hydrocortisone 1%.  Medications: Outpatient Encounter Medications as of 04/19/2021  Medication Sig   carvedilol (COREG) 25 MG tablet TAKE 1 TABLET(25 MG) BY MOUTH TWICE DAILY WITH A MEAL   Alcohol Swabs (B-D SINGLE USE SWABS BUTTERFLY)  PADS Use up to 4 times a day to test blood sugars. Dx: E10.9, E11.9   aspirin 81 MG tablet Take 1 tablet (81 mg total) by mouth daily.   atorvastatin (LIPITOR) 80 MG tablet Take 1 tablet (80 mg total) by mouth at bedtime.   Blood Glucose Calibration (TRUE METRIX LEVEL 1) Low SOLN Use as needed. Dx: E10.9, E11.9   blood glucose meter kit and supplies Dispense based on patient and insurance preference. Use up to four times daily as directed. (FOR ICD-10 E10.9, E11.9).   cetirizine (ZYRTEC) 10 MG tablet TAKE 1 TABLET BY MOUTH DAILY   clopidogrel (PLAVIX) 75 MG tablet Take 1 tablet (75 mg total) by mouth daily.   empagliflozin (JARDIANCE) 10 MG TABS tablet Take 1 tablet (10 mg total) by mouth daily before breakfast.   gabapentin (NEURONTIN) 300 MG capsule TAKE 1 CAPSULE(300 MG) BY MOUTH THREE TIMES DAILY   glipiZIDE (GLUCOTROL) 5 MG tablet Take 1 tablet (5 mg total) by mouth 2 (two) times daily.   glucose blood (TRUE METRIX BLOOD GLUCOSE TEST) test strip Use to test blood sugars up to 4 times a day. DX: E10.9, E.11.9   hydrocortisone cream 1 % Apply to affected area 2 times daily   isosorbide mononitrate (IMDUR) 30 MG 24 hr tablet TAKE 3 TABLETS(90 MG) BY MOUTH DAILY   Lancets 33G MISC Use to test blood sugars up to 4 times a day. DX: E10.9, E11.9   losartan (COZAAR) 50 MG tablet Take 1 tablet (50 mg total) by mouth daily.   nitroGLYCERIN (NITROSTAT) 0.4 MG SL tablet PLACE 1 TABLET UNDER THE TONGUE EVERY 5 MINUTES AS NEEDED FOR CHEST PAIN   pantoprazole (PROTONIX) 40 MG tablet Take 1 tablet (40 mg  total) by mouth daily.   PARoxetine (PAXIL) 20 MG tablet Take 1 tablet (20 mg total) by mouth daily.   potassium chloride (KLOR-CON) 10 MEQ tablet Take 2 tablets (20 mEq total) by mouth 2 (two) times daily.   spironolactone (ALDACTONE) 25 MG tablet Take 0.5 tablets (12.5 mg total) by mouth daily.   torsemide (DEMADEX) 20 MG tablet Take 1 tablet (20 mg total) by mouth 2 (two) times daily.   traMADol  (ULTRAM) 50 MG tablet TAKE 1 TABLET(50 MG) BY MOUTH EVERY 6 HOURS AS NEEDED   vitamin B-12 (CYANOCOBALAMIN) 1000 MCG tablet Take 1 tablet (1,000 mcg total) by mouth daily.   No facility-administered encounter medications on file as of 04/19/2021.   Reviewed chart for medication changes ahead of medication coordination call.  No OVs, Consults, or hospital visits since last care coordination call/Pharmacist visit. (If appropriate, list visit date, provider name)  No medication changes indicated OR if recent visit, treatment plan here.  BP Readings from Last 3 Encounters:  03/03/21 (!) 180/100  02/06/21 122/69  02/02/21 (!) 132/101    Lab Results  Component Value Date   HGBA1C 10.6 (H) 02/06/2021     Care Gaps Colonoscopy - Appt.03/17/22 Diabetic Foot Exam - 08/03/20 Mammogram - NA Ophthalmology - NA Dexa Scan - NA Annual Well Visit - NA Micro albumin - NA Hemoglobin A1c - 10.6 - 02/06/21  Star Rating Drugs: Atorvastatin - filled 02/06/21 90d Jardiance - filled 03/23/21 30d Glipizide - filled 02/06/21 90d Losartan - filled 03/03/21 [place], [place] Clinical Pharmacists Assistant [phone]

## 2021-04-21 ENCOUNTER — Other Ambulatory Visit: Payer: Self-pay | Admitting: Internal Medicine

## 2021-04-24 ENCOUNTER — Ambulatory Visit: Payer: Medicare HMO

## 2021-04-24 ENCOUNTER — Encounter: Payer: Self-pay | Admitting: Physician Assistant

## 2021-04-24 NOTE — Progress Notes (Unsigned)
Chronic Care Management Pharmacy Note  04/24/2021 Name:  [name] [name] MRN:  [id number] DOB:  02/14/1956  Summary: ***  Recommendations/Changes made from today's visit: ***  Plan: ***  Subjective: [name] [name] is an 65 y.o. year old male who is a primary patient of [name], Hunt Oris, MD.  The CCM team was consulted for assistance with disease management and care coordination needs.    Engaged with patient face to face for initial visit in response to provider referral for pharmacy case management and/or care coordination services.   Consent to Services:  The patient was given the following information about Chronic Care Management services today, agreed to services, and gave verbal consent: 1. CCM service includes personalized support from designated clinical staff supervised by the primary care provider, including individualized plan of care and coordination with other care providers 2. 24/7 contact phone numbers for assistance for urgent and routine care needs. 3. Service will only be billed when office clinical staff spend 20 minutes or more in a month to coordinate care. 4. Only one practitioner may furnish and bill the service in a calendar month. 5.The patient may stop CCM services at any time (effective at the end of the month) by phone call to the office staff. 6. The patient will be responsible for cost sharing (co-pay) of up to 20% of the service fee (after annual deductible is met). Patient agreed to services and consent obtained.  Patient Care Team: Biagio Borg, MD as PCP - General (Internal Medicine) Sherren Mocha, MD as PCP - Cardiology (Cardiology) Sherren Mocha, MD as Consulting Physician (Cardiology) Sharmon Revere as Physician Assistant (Cardiology) Sueanne Margarita, MD as Consulting Physician (Sleep Medicine) Knox Royalty, RN as Case Manager Baxter Gonzalez, Darnelle Maffucci, [facility] as Pharmacist (Pharmacist)  Recent office visits:  03/10/21 [name] (PCP) -notes not  available    12/26/20 [name] (PCP) - Cough. No med changes.   Recent consult visits:  03/03/21 [name] (Cardiology) - Coronary artery disease involving native coronary artery of native heart without angina pectoris. Start Losartan Pot 50 mg. EKG 12-lead.  01/24/21 Mayer (Podiatry) - mechanical debridement of nails  -f/u in 3 months   01/18/21 [name] (Cardiology) - Acute on chronic heart failure with preserved ejection fraction. Start Jardiance 10 mg. Decrease Gabapentin 300 mg to 2x daily. Increase Torsemide to 30 mg. D/c Prednisone 20 mg. EKG 12-lead.   11/15/20 Black (Audiology) - Sensorineural hearing loss, bilateral.   11/02/20 [name] (Podiatry) - Pain due to onychomycosis of toenails of both feet. No med changes.      Hospital visits:  Medication Reconciliation was completed by comparing discharge summary, patient's EMR and Pharmacy list, and upon discussion with patient.   Admitted to the hospital on 02/05/21 due to TIA. Discharge date was 02/06/21. Discharged from [facility]?Medications Started at [facility] Discharge:?? START taking: atorvastatin (LIPITOR) Start taking on: February 07, 2021 glipiZIDE (GLUCOTROL)   Medications Discontinued at [facility] Discharge: STOP taking: rosuvastatin 40 MG tablet (CRESTOR)   Medications that remain the same after Hospital Discharge:??  -All other medications will remain the same.     02/02/21 [name] ED) Acute on chronic congestive heart failure, unspecified heart failure type / hemorrhoid. Start Hydrocortisone 1% cream.  Objective:  Lab Results  Component Value Date   CREATININE 1.57 (H) 03/17/2021   BUN 23 03/17/2021   GFR 46.86 (L) 12/26/2020   GFRNONAA 56 (L) 02/06/2021   GFRAA 57 (L) 05/27/2020  NA 143 03/17/2021   K 4.0 03/17/2021   CALCIUM 9.9 03/17/2021   CO2 27 03/17/2021   GLUCOSE 132 (H) 03/17/2021    Lab Results  Component Value Date/Time   HGBA1C 10.6 (H) 02/06/2021 03:23 AM   HGBA1C 12.1 Repeated  and verified X2. (H) 12/26/2020 08:55 AM   GFR 46.86 (L) 12/26/2020 08:55 AM   GFR 40.69 (L) 06/23/2020 08:49 AM   MICROALBUR 64.1 (H) 06/23/2020 08:49 AM   MICROALBUR 49.0 (H) 05/14/2017 08:18 AM    Last diabetic Eye exam:  No results found for: HMDIABEYEEXA  Last diabetic Foot exam:  No results found for: HMDIABFOOTEX   Lab Results  Component Value Date   CHOL 140 02/06/2021   HDL 38 (L) 02/06/2021   LDLCALC 85 02/06/2021   TRIG 84 02/06/2021   CHOLHDL 3.7 02/06/2021    Hepatic Function Latest Ref Rng & Units 02/05/2021 02/02/2021 12/26/2020  Total Protein 6.5 - 8.1 g/dL 7.6 7.0 7.4  Albumin 3.5 - 5.0 g/dL 3.9 3.7 4.0  AST 15 - 41 U/L _0 ALT 0 - 44 U/L _1 Alk Phosphatase 38 - 126 U/L 59 61 88  Total Bilirubin 0.3 - 1.2 mg/dL 0.9 0.8 0.6  Bilirubin, Direct 0.0 - 0.3 mg/dL - - 0.1    Lab Results  Component Value Date/Time   TSH 0.86 06/23/2020 08:49 AM   TSH 1.44 11/20/2019 12:38 PM    CBC Latest Ref Rng & Units 02/06/2021 02/05/2021 02/05/2021  WBC 4.0 - 10.5 K/uL 6.2 - 7.1  Hemoglobin 13.0 - 17.0 g/dL 16.1 18.0(H) 16.7  Hematocrit 39.0 - 52.0 % 50.3 53.0(H) 52.1(H)  Platelets 150 - 400 K/uL 265 - 244    Lab Results  Component Value Date/Time   VD25OH 41.96 06/23/2020 08:49 AM   VD25OH 8.37 (L) 11/20/2019 12:38 PM    Clinical ASCVD: Yes  The ASCVD Risk score (Arnett DK, et al., 2019) failed to calculate for the following reasons:   The patient has a prior MI or stroke diagnosis    Depression screen [facility] 2/9 01/25/2021 12/26/2020 09/09/2020  Decreased Interest 0 0 1  Down, Depressed, Hopeless 1 0 1  PHQ - 2 Score 1 0 2  Altered sleeping - 1 -  Tired, decreased energy - 1 -  Change in appetite - 0 -  Feeling bad or failure about yourself  - 0 -  Trouble concentrating - 0 -  Moving slowly or fidgety/restless - 0 -  Suicidal thoughts - 0 -  PHQ-9 Score - 2 -  Difficult doing work/chores - Not difficult at all -  Some recent data might be hidden     Social History   Tobacco Use  Smoking Status Former   Packs/day: 0.50   Years: 30.00   Pack years: 15.00   Types: Cigarettes  Smokeless Tobacco Never  Tobacco Comments   trying to cut down   BP Readings from Last 3 Encounters:  03/03/21 (!) 180/100  02/06/21 122/69  02/02/21 (!) 132/101   Pulse Readings from Last 3 Encounters:  03/03/21 (!) 55  02/06/21 (!) 58  02/02/21 (!) 113   Wt Readings from Last 3 Encounters:  03/03/21 (!) 314 lb 9.6 oz (142.7 kg)  02/06/21 (!) 318 lb (144.2 kg)  02/02/21 (!) 318 lb (144.2 kg)   BMI Readings from Last 3 Encounters:  03/03/21 41.51 kg/m  02/06/21 41.96 kg/m  02/02/21 41.96 kg/m    Assessment/Interventions: Review of patient past  medical history, allergies, medications, health status, including review of consultants reports, laboratory and other test data, was performed as part of comprehensive evaluation and provision of chronic care management services.   SDOH:  (Social Determinants of Health) assessments and interventions performed: Yes  SDOH Screenings   Alcohol Screen: Low Risk    Last Alcohol Screening Score (AUDIT): 0  Depression (PHQ2-9): Low Risk    PHQ-2 Score: 1  Financial Resource Strain: Low Risk    Difficulty of Paying Living Expenses: Not hard at all  Food Insecurity: Food Insecurity Present   Worried About Charity fundraiser in the Last Year: Sometimes true   Arboriculturist in the Last Year: Sometimes true  Housing: [place] Risk Score: 0  Physical Activity: Inactive   Days of Exercise per Week: 0 days   Minutes of Exercise per Session: 0 min  Social Connections: Not on file  Stress: No Stress Concern Present   Feeling of Stress : Not at all  Tobacco Use: Medium Risk   Smoking Tobacco Use: Former   Smokeless Tobacco Use: Never   Passive Exposure: Not on file  Transportation Needs: No Transportation Needs   Lack of Transportation (Medical): No   Lack of Transportation  (Non-Medical): No    CCM Care Plan  Allergies  Allergen Reactions   Penicillin G Other (See Comments)    Did it involve swelling of the face/tongue/throat, SOB, or low BP?  N/A Did it involve sudden or severe rash/hives, skin peeling, or any reaction on the inside of your mouth or nose? N/A Did you need to seek medical attention at a hospital or doctor's office? N/A When did it last happen? Child If all above answers are "NO", may proceed with cephalosporin use.   Penicillins Other (See Comments)    Did it involve swelling of the face/tongue/throat, SOB, or low BP? N/A Did it involve sudden or severe rash/hives, skin peeling, or any reaction on the inside of your mouth or nose?N/A Did you need to seek medical attention at a hospital or doctor's office? N/A When did it last happen? Child      If all above answers are "NO", may proceed with cephalosporin use.    Medications Reviewed Today     Reviewed by Knox Royalty, RN (Registered Nurse) on 04/04/21 at [place] List Status: <None>   Medication Order Taking? Sig Documenting Provider Last Dose Status Informant  Alcohol Swabs (B-D SINGLE USE SWABS BUTTERFLY) PADS [id number] No Use up to 4 times a day to test blood sugars. Dx: E10.9, E11.9 Biagio Borg, MD Taking Active Self  aspirin 81 MG tablet [id number] No Take 1 tablet (81 mg total) by mouth daily. Regalado, Belkys A, MD Taking Active   atorvastatin (LIPITOR) 80 MG tablet [id number] No Take 1 tablet (80 mg total) by mouth at bedtime. Regalado, Belkys A, MD Taking Active   Blood Glucose Calibration (TRUE METRIX LEVEL 1) Low SOLN [id number] No Use as needed. Dx: E10.9, E11.9 Biagio Borg, MD Taking Active Self  blood glucose meter kit and supplies [id number] No Dispense based on patient and insurance preference. Use up to four times daily as directed. (FOR ICD-10 E10.9, E11.9). Biagio Borg, MD Taking Active Self           Med Note Olena Heckle, [facility]   Fri Mar 03, 2021 11:20 AM)     carvedilol (COREG) 25 MG tablet [id number] No Take 1 tablet (  25 mg total) by mouth 2 (two) times daily with a meal. [name] T, PA-C Taking Active Self  cetirizine (ZYRTEC) 10 MG tablet [id number] No TAKE 1 TABLET BY MOUTH DAILY Biagio Borg, MD Taking Active   clopidogrel (PLAVIX) 75 MG tablet [id number] No Take 1 tablet (75 mg total) by mouth daily. Regalado, Belkys A, MD Taking Active   empagliflozin (JARDIANCE) 10 MG TABS tablet [id number] No Take 1 tablet (10 mg total) by mouth daily before breakfast. [name] T, PA-C Taking Active Self  gabapentin (NEURONTIN) 300 MG capsule [id number] No TAKE 1 CAPSULE(300 MG) BY MOUTH THREE TIMES DAILY Biagio Borg, MD Taking Active   glipiZIDE (GLUCOTROL) 5 MG tablet [id number] No Take 1 tablet (5 mg total) by mouth 2 (two) times daily. Regalado, Belkys A, MD Taking Active   glucose blood (TRUE METRIX BLOOD GLUCOSE TEST) test strip [id number] No Use to test blood sugars up to 4 times a day. DX: E10.9, E.11.9 Biagio Borg, MD Taking Active   hydrocortisone cream 1 % [id number] No Apply to affected area 2 times daily Sherwood Gambler, MD Taking Active Self  isosorbide mononitrate (IMDUR) 30 MG 24 hr tablet [id number] No TAKE 3 TABLETS(90 MG) BY MOUTH DAILY Biagio Borg, MD Taking Active   Lancets 33G MISC [id number] No Use to test blood sugars up to 4 times a day. DX: E10.9, E11.9 Biagio Borg, MD Taking Active   losartan (COZAAR) 50 MG tablet [id number]  Take 1 tablet (50 mg total) by mouth daily. [name] T, PA-C  Active   nitroGLYCERIN (NITROSTAT) 0.4 MG SL tablet [id number] No PLACE 1 TABLET UNDER THE TONGUE EVERY 5 MINUTES AS NEEDED FOR CHEST PAIN Biagio Borg, MD Taking Active   pantoprazole (PROTONIX) 40 MG tablet [id number] No Take 1 tablet (40 mg total) by mouth daily. Biagio Borg, MD Taking Active Self  PARoxetine (PAXIL) 20 MG tablet [id number] No Take 1 tablet (20 mg total) by mouth daily. Biagio Borg, MD Taking Active Self           Med  Note Olena Heckle, [facility]   Fri Mar 03, 2021 11:21 AM)    potassium chloride (KLOR-CON) 10 MEQ tablet [id number] No Take 2 tablets (20 mEq total) by mouth 2 (two) times daily. [name] T, PA-C Taking Active Self  spironolactone (ALDACTONE) 25 MG tablet [id number] No Take 0.5 tablets (12.5 mg total) by mouth daily. [name] T, PA-C Taking Active Self  torsemide (DEMADEX) 20 MG tablet [id number] No Take 1 tablet (20 mg total) by mouth 2 (two) times daily. [name] T, PA-C Taking Active Self  traMADol (ULTRAM) 50 MG tablet [id number] No TAKE 1 TABLET(50 MG) BY MOUTH EVERY 6 HOURS AS NEEDED Biagio Borg, MD Taking Active Self  vitamin B-12 (CYANOCOBALAMIN) 1000 MCG tablet [id number] No Take 1 tablet (1,000 mcg total) by mouth daily. Biagio Borg, MD Taking Active Self  Med List Note Eartha Inch, [place] 12/15/15 1042): CPAP            Patient Active Problem List   Diagnosis Date Noted   TIA (transient ischemic attack) 02/05/2021   Aortic atherosclerosis ([place]) 12/26/2020   Cough 12/26/2020   Vitamin D deficiency 04/03/2020   B12 deficiency 04/03/2020   Itching 11/22/2019   Chronic heart failure with preserved ejection fraction (HFpEF) ([place]) 05/13/2019   Chronic coronary artery disease 05/13/2019   Elevated troponin 05/13/2019   Morbid obesity with BMI of 40.0-44.9, adult (  [place]) 05/13/2019   Pre-ulcerative calluses 02/03/2019   Foot injury, left, initial encounter 11/26/2018   Chest pain 05/30/2017   Hypokalemia 05/30/2017   Allergic rhinitis 12/18/2016   Lumbar radiculitis 09/05/2016   Lumbar spinal stenosis 09/05/2016   CKD (chronic kidney disease), stage III ([place]) 12/29/2015   Chest pain syndrome 12/15/2015   Acute kidney injury ([place]) 12/15/2015   Atypical chest pain 12/15/2015   Encounter for well adult exam with abnormal findings 06/27/2015   Chronic low back pain 06/27/2015   Left lumbar radiculopathy 04/13/2015   Bilateral plantar fasciitis 04/13/2015   Essential  hypertension 03/09/2015   OSA (obstructive sleep apnea) 03/27/2014   Peripheral neuropathy ([place]) 12/29/2013   External hemorrhoid, thrombosed 12/03/2013   Sinus bradycardia 11/15/2013   NSTEMI (non-ST elevated myocardial infarction) ([place]) 11/14/2013   Chronic combined systolic and diastolic CHF  27/51/7001   CAD S/P multiple PCI    Hyperlipidemia    DM2 (diabetes mellitus, type 2) ([place]) 06/26/2013   Gait disorder 06/26/2013   Obesity, Class III, BMI 40-49.9 (morbid obesity) ([place]) 09/10/2012   Tobacco abuse 09/02/2012   Ischemic cardiomyopathy- EF 50-55% June 2015    ST elevation myocardial infarction (STEMI) of anterior wall, initial episode of care ([place]) 09/01/2012   HEMORRHOIDS 12/26/2009   Acute prostatitis 12/26/2009   GANGLION CYST, WRIST, RIGHT 11/28/2009   COLONIC POLYPS 10/07/2009   Anxiety with depression 07/21/2009   DYSPNEA 03/24/2009   GASTROESOPHAGEAL REFLUX DISEASE 01/27/2007   PERCUTANEOUS TRANSLUMINAL CORONARY ANGIOPLASTY, HX OF 02/09/2006    Immunization History  Administered Date(s) Administered   Influenza Whole 04/12/2008, 04/12/2010, 02/10/2011   Influenza, Seasonal, Injecte, Preservative Fre 03/11/2013   Influenza,inj,Quad PF,6+ Mos 02/12/2015, 05/04/2016, 03/29/2017, 05/05/2018, 05/15/2019, 03/22/2020   Moderna Sars-Covid-2 Vaccination 11/10/2019, 12/07/2019, 05/16/2020   PFIZER(Purple Top)SARS-COV-2 Vaccination 11/10/2019   Pneumococcal Conjugate-13 06/26/2013   Pneumococcal Polysaccharide-23 07/21/2009, 06/27/2015   Td 03/24/2009   Zoster Recombinat (Shingrix) 04/02/2017    Conditions to be addressed/monitored:  {USCCMDZASSESSMENTOPTIONS:23563}  There are no care plans that you recently modified to display for this patient.     Medication Assistance: {MEDASSISTANCEINFO:25044}  Compliance/Adherence/Medication fill history: Care Gaps: ***  Patient's preferred pharmacy is:  [id number] [address], [place] [address] [place] [address] Phone: [phone] Fax: [phone]  [place], [address] [place] [address] Phone: [phone] Fax: [phone]   Uses pill box? {Yes or If no, why not?:20788} Pt endorses ***% compliance  Care Plan and Follow Up Patient Decision:  {FOLLOWUP:24991}  Plan: {CM FOLLOW UP TDSK:87681}  ***  Current Barriers:  {pharmacybarriers:24917}  Pharmacist Clinical Goal(s):  Patient will {PHARMACYGOALCHOICES:24921} through collaboration with PharmD and provider.   Interventions: 1:1 collaboration with Biagio Borg, MD regarding development and update of comprehensive plan of care as evidenced by provider attestation and co-signature Inter-disciplinary care team collaboration (see longitudinal plan of care) Comprehensive medication review performed; medication list updated in electronic medical record  Hyperlipidemia/Coronary Artery Disease / previous CVA: (LDL goal < 70) -Not ideally controlled - has not been checked since switching to atorvastatin  Lab Results  Component Value Date   [place] 85 02/06/2021  -Current treatment: Atorvastatin 31m - 1 tablet daily  Clopidogrel 721m- 1 tablet daily  ASA 8162m 1 tablet daily  Nitroglycerin 0.4mg76m1 tablet every 5 minutes as needed for chest pain -Medications  previously tried: pravastatin, rosuvastatin   -Current dietary patterns: *** -Current exercise habits: *** -Educated on {CCM HLD Counseling:25126} -{CCMPHARMDINTERVENTION:25122}  Diabetes (A1c goal <7%) -Uncontrolled Lab Results  Component Value Date   HGBA1C 10.6 (H) 02/06/2021  -Current medications: Glipizide 28m - 1 tablet twice daily  Jardiance 137m 1 tablet daily  -Medications previously tried: glipizide XL, glimepiride, metformin, metformin XR  -Current home glucose readings fasting glucose: *** post prandial  glucose: *** -{ACTIONS;DENIES/REPORTS:21021675::"Denies"} hypoglycemic/hyperglycemic symptoms -Current meal patterns:  breakfast: ***  lunch: ***  dinner: *** snacks: *** drinks: *** -Current exercise: *** -Educated on {CCM DM COUNSELING:25123} -Counseled to check feet daily and get yearly eye exams -{CCMPHARMDINTERVENTION:25122}  Heart Failure (Goal: manage symptoms and prevent exacerbations)  / Hypertension (BP goal <140/90) -{US controlled/uncontrolled:25276} -Last ejection fraction: 45-50% (Date: 02/06/2021) -HF type: Diastolic -NYHA Class: II (slight limitation of activity) -Current treatment: Carvedilol 2551m 1 tablet twice daily  Losartan 10m103m1 tablet daily  Torsemide 20mg37m tablet twice daily  Potassium Chloride 10mEq26m tablets twice daily  Jardiance 10mg -101mablet daily  Isosorbide mononitrate 30mg -31mlets daily  Spironolactone 25mg - 132mablet daily  -Medications previously tried: amlodipine, benazepril, clonidine, furosemide,   -Current home BP/HR readings: *** -Current dietary habits: *** -Current exercise habits: *** -Educated on {CCM HF Counseling:25125} -{CCMPHARMDINTERVENTION:25122}  Chronic Kidney Disease (Goal: Prevention of disease progression) -{US controlled/uncontrolled:25276} -Last eGFR: 49 mL/min  -Current treatment  *** -Medications previously tried: ***  -{CCMPHARMDINTERVENTION:25122}   Depression (Goal: Promotion / maintenance of positive moode) -{US controlled/uncontrolled:25276} -Current treatment: Paroxetine 20mg - 1 79met daily  -Medications previously tried/failed: bupropion, citalopram, escitalopram -PHQ9: *** -GAD7: *** -Connected with *** for mental health support -Educated on {CCM mental health counseling:25127} -{CCMPHARMDINTERVENTION:25122}  Chronic Pain / Peripheral neuropath / Lumbar radiculitis (Goal: Pain Management ) -{US controlled/uncontrolled:25276} -Current treatment  Tramadol 10mg - 1 t79mt every 6  hours as needed  Gabapentin 300mg - 1 ca60me 3 times daily  -Medications previously tried: APAP, norco, IBU, oxycodone  -{CCMPHARMDINTERVENTION:25122}  Health Maintenance -Vaccine gaps: *** -Current therapy:  Pantoprazole 40mg - 1 tab49mdaily  Hydrocortisone 1% cream - applied twice daily  Vitamin B12 1000mcg -1 tabl60maily  Cetirizine 10mg - 1 table62mily  -Educated on {ccm supplement counseling:25128} -{CCM Patient satisfied:25129} -{CCMPHARMDINTERVENTION:25122}  Patient Goals/Self-Care Activities Patient will:  - {pharmacypatientgoals:24919}  Follow Up Plan: {CM FOLLOW UP PLAN:22241}   CRCBU:38453}t prep  26 minutes

## 2021-05-01 ENCOUNTER — Other Ambulatory Visit: Payer: Self-pay | Admitting: Physician Assistant

## 2021-05-02 ENCOUNTER — Telehealth: Payer: Self-pay

## 2021-05-02 NOTE — Chronic Care Management (AMB) (Signed)
Chronic Care Management Pharmacy Assistant   Name: [name] [name]  MRN: [id number] DOB: 1955-11-28  [name] [name] is an 65 y.o. year old male who presents for his initial CCM visit with the clinical pharmacist.  Reason for Encounter: Initial Visit    Recent office visits:  12/26/20 Biagio Borg, MD-PCP ([place]ough) Labs ordered, no med changes  Recent consult visits:  03/03/21 [name] T, PA-C-Cardiology (Coronary artery disease involving native coronary artery of native heart without angina pectoris) med changes:Start Losartan 50 mg daily   01/24/21 Gardiner Barefoot, DPM-Podiatry (Pain due to onychomycosis of toenails of both feet) no med changes  01/18/21 [name] T, PA-C-Cardiology (Acute on chronic heart failure with preserved ejection fraction) orders: labs, bmp, bnp, med changes:Begin Jardiance, 10mg  tablet, once daily  11/02/20 Gardiner Barefoot, DPM-Podiatry (Pain due to onychomycosis of toenails of both feet) No orders or med changes  Hospital visits:  Medication Reconciliation was completed by comparing discharge summary, patient's EMR and Pharmacy list, and upon discussion with patient.  Admitted to the hospital on 02/05/21 due to TIA. Discharge date was 02/06/21. Discharged from [place]?Medications Started at [facility] Discharge:?? -started atorvastatin 80 mg   Medications Discontinued at Hospital Discharge: -Stopped rosuvastatin 40 mg   Medications that remain the same after Hospital Discharge:??  -All other medications will remain the same.    Medications: Outpatient Encounter Medications as of 05/02/2021  Medication Sig   carvedilol (COREG) 25 MG tablet TAKE 1 TABLET(25 MG) BY MOUTH TWICE DAILY WITH A MEAL   Alcohol Swabs (B-D SINGLE USE SWABS BUTTERFLY) PADS Use up to 4 times a day to test blood sugars. Dx: E10.9, E11.9   aspirin 81 MG tablet Take 1 tablet (81 mg total) by mouth daily.   atorvastatin (LIPITOR) 80 MG tablet Take 1  tablet (80 mg total) by mouth at bedtime.   Blood Glucose Calibration (TRUE METRIX LEVEL 1) Low SOLN Use as needed. Dx: E10.9, E11.9   blood glucose meter kit and supplies Dispense based on patient and insurance preference. Use up to four times daily as directed. (FOR ICD-10 E10.9, E11.9).   cetirizine (ZYRTEC) 10 MG tablet TAKE 1 TABLET BY MOUTH DAILY   clopidogrel (PLAVIX) 75 MG tablet Take 1 tablet (75 mg total) by mouth daily.   empagliflozin (JARDIANCE) 10 MG TABS tablet Take 1 tablet (10 mg total) by mouth daily before breakfast.   gabapentin (NEURONTIN) 300 MG capsule TAKE 1 CAPSULE(300 MG) BY MOUTH THREE TIMES DAILY   glipiZIDE (GLUCOTROL) 5 MG tablet Take 1 tablet (5 mg total) by mouth 2 (two) times daily.   glucose blood (TRUE METRIX BLOOD GLUCOSE TEST) test strip Use to test blood sugars up to 4 times a day. DX: E10.9, E.11.9   hydrocortisone cream 1 % Apply to affected area 2 times daily   isosorbide mononitrate (IMDUR) 30 MG 24 hr tablet TAKE 3 TABLETS(90 MG) BY MOUTH DAILY   Lancets 33G MISC Use to test blood sugars up to 4 times a day. DX: E10.9, E11.9   losartan (COZAAR) 50 MG tablet Take 1 tablet (50 mg total) by mouth daily.   nitroGLYCERIN (NITROSTAT) 0.4 MG SL tablet PLACE 1 TABLET UNDER THE TONGUE EVERY 5 MINUTES AS NEEDED FOR CHEST PAIN   pantoprazole (PROTONIX) 40 MG tablet Take 1 tablet (40 mg total) by mouth daily.   PARoxetine (PAXIL) 20 MG tablet Take 1 tablet (20 mg total) by mouth daily.   potassium chloride (  KLOR-CON) 10 MEQ tablet Take 2 tablets (20 mEq total) by mouth 2 (two) times daily.   spironolactone (ALDACTONE) 25 MG tablet Take 0.5 tablets (12.5 mg total) by mouth daily.   torsemide (DEMADEX) 20 MG tablet Take 1 tablet (20 mg total) by mouth 2 (two) times daily.   traMADol (ULTRAM) 50 MG tablet TAKE 1 TABLET(50 MG) BY MOUTH EVERY 6 HOURS AS NEEDED   vitamin B-12 (CYANOCOBALAMIN) 1000 MCG tablet Take 1 tablet (1,000 mcg total) by mouth daily.   No  facility-administered encounter medications on file as of 05/02/2021.   Medication List: carvedilol (COREG) 25 MG tablet-last fill 04/18/21 90 ds Alcohol Swabs (B-D SINGLE USE SWABS BUTTERFLY) PADS-last fill 11/14/20 90 ds aspirin 81 MG tablet-last fill 02/06/21 atorvastatin (LIPITOR) 80 MG tablet-last fill 02/06/21 90 ds Blood Glucose Calibration (TRUE METRIX LEVEL 1) Low SOLN-last fill 01/12/21 30 ds blood glucose meter kit and supplies cetirizine (ZYRTEC) 10 MG tablet-last fill 09/19/20 90 ds clopidogrel (PLAVIX) 75 MG tablet-last fill 02/02/21 90 ds empagliflozin (JARDIANCE) 10 MG TABS tablet-last fill 03/23/21 90 ds gabapentin (NEURONTIN) 300 MG capsule-last fill 01/31/21 ds glipiZIDE (GLUCOTROL) 5 MG tablet-last filled 02/06/21 90 ds glucose blood (TRUE METRIX BLOOD GLUCOSE TEST) test strip hydrocortisone cream 1 % isosorbide mononitrate (IMDUR) 30 MG 24 hr tablet-last fill 02/06/21 90 ds Lancets 33G MISC-last fill 02/20/21 90 ds losartan (COZAAR) 50 MG tablet-last fill 03/03/21 90 ds nitroGLYCERIN (NITROSTAT) 0.4 MG SL tablet-last fill 02/23/21 30 ds pantoprazole (PROTONIX) 40 MG tablet-last fill 02/02/21 90 ds PARoxetine (PAXIL) 20 MG tablet-last fill 02/23/21 ds potassium chloride (KLOR-CON) 10 MEQ tablet-last fill 02/03/21 90 ds spironolactone (ALDACTONE) 25 MG tablet-last fill 01/16/21 90 ds torsemide (DEMADEX) 20 MG tablet-last fill 02/12/21 90 ds traMADol (ULTRAM) 50 MG tablet-last fill 01/20/21 30 ds vitamin B-12 (CYANOCOBALAMIN) 1000 MCG tablet  Care Gaps: Colonoscopy-03/23/16 Diabetic Foot Exam-08/03/20 Ophthalmology-NA Dexa Scan - NA Annual Well Visit - NA Micro albumin-06/23/20 Hemoglobin A1c8/29/22  Star Rating Drugs: atorvastatin (LIPITOR) 80 MG tablet-last fill 02/06/21 90 ds losartan (COZAAR) 50 MG tablet-last fill 03/03/21 90 ds  Ethelene Hal Clinical Pharmacist Assistant [phone]

## 2021-05-03 ENCOUNTER — Ambulatory Visit: Payer: Medicare HMO | Admitting: Podiatry

## 2021-05-08 ENCOUNTER — Other Ambulatory Visit: Payer: Self-pay

## 2021-05-08 ENCOUNTER — Ambulatory Visit (INDEPENDENT_AMBULATORY_CARE_PROVIDER_SITE_OTHER): Payer: Medicare HMO

## 2021-05-08 DIAGNOSIS — N1831 Chronic kidney disease, stage 3a: Secondary | ICD-10-CM

## 2021-05-08 DIAGNOSIS — E782 Mixed hyperlipidemia: Secondary | ICD-10-CM

## 2021-05-08 DIAGNOSIS — E1159 Type 2 diabetes mellitus with other circulatory complications: Secondary | ICD-10-CM

## 2021-05-08 DIAGNOSIS — I5042 Chronic combined systolic (congestive) and diastolic (congestive) heart failure: Secondary | ICD-10-CM

## 2021-05-08 NOTE — Progress Notes (Addendum)
Chronic Care Management Pharmacy Note  05/08/2021 Name:  [name] [name] MRN:  [id number] DOB:  1955-11-12  Summary: -Patient notes that recently he has not been checking BP / BG / Weight, reports that he has a scale and a BP cuff, does not currently have a working BG machine  -Because he has not been able to check his blood sugars he has not been taking glipizide due to feelings of hypoglycemia he was experiencing when taking - of note patient reports to intermittent fasting recently, typically only eating in the early afternoon / evening  -Denies any issues since starting on atorvastatin, will be due for updated LDL with next PCP / cardiology visit   Recommendations/Changes made from today's visit: -Recommended for patient to start monitoring BP, BG, and weights daily, reviewed with patient BP, BG goals, along with monitoring parameters regarding weight -Advised for patient to moderate carb intake, recommended for patient to stop drinking regular sodas, recommended water / diet soda intake  -Encouraged patient to increase activity as he is able to help better control LDL, BG, and BP -Patient has follow up with cardiology next month, and will establish with neurology next month as well   Subjective: [name] [name] is an 65 y.o. year old male who is a primary patient of [name], Hunt Oris, MD.  The CCM team was consulted for assistance with disease management and care coordination needs.    Engaged with patient face to face for initial visit in response to provider referral for pharmacy case management and/or care coordination services.   Consent to Services:  The patient was given the following information about Chronic Care Management services today, agreed to services, and gave verbal consent: 1. CCM service includes personalized support from designated clinical staff supervised by the primary care provider, including individualized plan of care and coordination with other care providers 2. 24/7  contact phone numbers for assistance for urgent and routine care needs. 3. Service will only be billed when office clinical staff spend 20 minutes or more in a month to coordinate care. 4. Only one practitioner may furnish and bill the service in a calendar month. 5.The patient may stop CCM services at any time (effective at the end of the month) by phone call to the office staff. 6. The patient will be responsible for cost sharing (co-pay) of up to 20% of the service fee (after annual deductible is met). Patient agreed to services and consent obtained.  Patient Care Team: Biagio Borg, MD as PCP - General (Internal Medicine) Sherren Mocha, MD as PCP - Cardiology (Cardiology) Sherren Mocha, MD as Consulting Physician (Cardiology) Sharmon Revere as Physician Assistant (Cardiology) Sueanne Margarita, MD as Consulting Physician (Sleep Medicine) Knox Royalty, RN as Case Manager Aeisha Minarik, Darnelle Maffucci, [facility] as Pharmacist (Pharmacist)  Recent office visits:  03/10/21 [name] (PCP) -notes not available    12/26/20 [name] (PCP) - Cough. No med changes.   Recent consult visits:  03/03/21 [name] (Cardiology) - Coronary artery disease involving native coronary artery of native heart without angina pectoris. Start Losartan Pot 50 mg. EKG 12-lead.  01/24/21 Mayer (Podiatry) - mechanical debridement of nails  -f/u in 3 months   01/18/21 [name] (Cardiology) - Acute on chronic heart failure with preserved ejection fraction. Start Jardiance 10 mg. Decrease Gabapentin 300 mg to 2x daily. Increase Torsemide to 30 mg. D/c Prednisone 20 mg. EKG 12-lead.   11/15/20 Black (Audiology) - Sensorineural hearing loss, bilateral.   11/02/20 [name] (Podiatry) -  Pain due to onychomycosis of toenails of both feet. No med changes.      Hospital visits:  Medication Reconciliation was completed by comparing discharge summary, patient's EMR and Pharmacy list, and upon discussion with patient.   Admitted to the hospital on  02/05/21 due to TIA. Discharge date was 02/06/21. Discharged from [facility]?Medications Started at [facility] Discharge:?? START taking: atorvastatin (LIPITOR) Start taking on: February 07, 2021 glipiZIDE (GLUCOTROL)   Medications Discontinued at [facility] Discharge: STOP taking: rosuvastatin 40 MG tablet (CRESTOR)   Medications that remain the same after Hospital Discharge:??  -All other medications will remain the same.     02/02/21 [name] [facility] ED) Acute on chronic congestive heart failure, unspecified heart failure type / hemorrhoid. Start Hydrocortisone 1% cream.  Objective:  Lab Results  Component Value Date   CREATININE 1.57 (H) 03/17/2021   BUN 23 03/17/2021   GFR 46.86 (L) 12/26/2020   GFRNONAA 56 (L) 02/06/2021   GFRAA 57 (L) 05/27/2020   NA 143 03/17/2021   K 4.0 03/17/2021   CALCIUM 9.9 03/17/2021   CO2 27 03/17/2021   GLUCOSE 132 (H) 03/17/2021    Lab Results  Component Value Date/Time   HGBA1C 10.6 (H) 02/06/2021 03:23 AM   HGBA1C 12.1 Repeated and verified X2. (H) 12/26/2020 08:55 AM   GFR 46.86 (L) 12/26/2020 08:55 AM   GFR 40.69 (L) 06/23/2020 08:49 AM   MICROALBUR 64.1 (H) 06/23/2020 08:49 AM   MICROALBUR 49.0 (H) 05/14/2017 08:18 AM    Last diabetic Eye exam:  No results found for: HMDIABEYEEXA  Last diabetic Foot exam:  No results found for: HMDIABFOOTEX   Lab Results  Component Value Date   CHOL 140 02/06/2021   HDL 38 (L) 02/06/2021   LDLCALC 85 02/06/2021   TRIG 84 02/06/2021   CHOLHDL 3.7 02/06/2021    Hepatic Function Latest Ref Rng & Units 02/05/2021 02/02/2021 12/26/2020  Total Protein 6.5 - 8.1 g/dL 7.6 7.0 7.4  Albumin 3.5 - 5.0 g/dL 3.9 3.7 4.0  AST 15 - 41 U/L $Remo'20 26 12  'cUuEV$ ALT 0 - 44 U/L $Remo'22 21 13  'KDVdS$ Alk Phosphatase 38 - 126 U/L 59 61 88  Total Bilirubin 0.3 - 1.2 mg/dL 0.9 0.8 0.6  Bilirubin, Direct 0.0 - 0.3 mg/dL - - 0.1    Lab Results  Component Value Date/Time   TSH 0.86 06/23/2020 08:49 AM   TSH 1.44  11/20/2019 12:38 PM    CBC Latest Ref Rng & Units 02/06/2021 02/05/2021 02/05/2021  WBC 4.0 - 10.5 K/uL 6.2 - 7.1  Hemoglobin 13.0 - 17.0 g/dL 16.1 18.0(H) 16.7  Hematocrit 39.0 - 52.0 % 50.3 53.0(H) 52.1(H)  Platelets 150 - 400 K/uL 265 - 244    Lab Results  Component Value Date/Time   VD25OH 41.96 06/23/2020 08:49 AM   VD25OH 8.37 (L) 11/20/2019 12:38 PM    Clinical ASCVD: Yes  The ASCVD Risk score (Arnett DK, et al., 2019) failed to calculate for the following reasons:   The patient has a prior MI or stroke diagnosis    Depression screen [facility] 2/9 01/25/2021 12/26/2020 09/09/2020  Decreased Interest 0 0 1  Down, Depressed, Hopeless 1 0 1  PHQ - 2 Score 1 0 2  Altered sleeping - 1 -  Tired, decreased energy - 1 -  Change in appetite - 0 -  Feeling bad or failure about yourself  - 0 -  Trouble concentrating - 0 -  Moving slowly  or fidgety/restless - 0 -  Suicidal thoughts - 0 -  PHQ-9 Score - 2 -  Difficult doing work/chores - Not difficult at all -  Some recent data might be hidden    Social History   Tobacco Use  Smoking Status Former   Packs/day: 0.50   Years: 30.00   Pack years: 15.00   Types: Cigarettes  Smokeless Tobacco Never  Tobacco Comments   trying to cut down   BP Readings from Last 3 Encounters:  03/03/21 (!) 180/100  02/06/21 122/69  02/02/21 (!) 132/101   Pulse Readings from Last 3 Encounters:  03/03/21 (!) 55  02/06/21 (!) 58  02/02/21 (!) 113   Wt Readings from Last 3 Encounters:  03/03/21 (!) 314 lb 9.6 oz (142.7 kg)  02/06/21 (!) 318 lb (144.2 kg)  02/02/21 (!) 318 lb (144.2 kg)   BMI Readings from Last 3 Encounters:  03/03/21 41.51 kg/m  02/06/21 41.96 kg/m  02/02/21 41.96 kg/m    Assessment/Interventions: Review of patient past medical history, allergies, medications, health status, including review of consultants reports, laboratory and other test data, was performed as part of comprehensive evaluation and provision of chronic care  management services.   SDOH:  (Social Determinants of Health) assessments and interventions performed: Yes  SDOH Screenings   Alcohol Screen: Low Risk    Last Alcohol Screening Score (AUDIT): 0  Depression (PHQ2-9): Low Risk    PHQ-2 Score: 1  Financial Resource Strain: Low Risk    Difficulty of Paying Living Expenses: Not hard at all  Food Insecurity: Food Insecurity Present   Worried About Programme researcher, broadcasting/film/video in the Last Year: Sometimes true   Barista in the Last Year: Sometimes true  Housing: Low Risk    Last Housing Risk Score: 0  Physical Activity: Inactive   Days of Exercise per Week: 0 days   Minutes of Exercise per Session: 0 min  Social Connections: Not on file  Stress: No Stress Concern Present   Feeling of Stress : Not at all  Tobacco Use: Medium Risk   Smoking Tobacco Use: Former   Smokeless Tobacco Use: Never   Passive Exposure: Not on file  Transportation Needs: No Transportation Needs   Lack of Transportation (Medical): No   Lack of Transportation (Non-Medical): No    CCM Care Plan  Allergies  Allergen Reactions   Penicillin G Other (See Comments)    Did it involve swelling of the face/tongue/throat, SOB, or low BP?  N/A Did it involve sudden or severe rash/hives, skin peeling, or any reaction on the inside of your mouth or nose? N/A Did you need to seek medical attention at a hospital or doctor's office? N/A When did it last happen? Child If all above answers are "NO", may proceed with cephalosporin use.   Penicillins Other (See Comments)    Did it involve swelling of the face/tongue/throat, SOB, or low BP? N/A Did it involve sudden or severe rash/hives, skin peeling, or any reaction on the inside of your mouth or nose?N/A Did you need to seek medical attention at a hospital or doctor's office? N/A When did it last happen? Child      If all above answers are "NO", may proceed with cephalosporin use.    Medications Reviewed Today     Reviewed  by Ellin Saba, [facility] (Pharmacist) on 05/08/21 at 1122  Med List Status: <None>   Medication Order Taking? Sig Documenting Provider Last Dose Status Informant  Alcohol Swabs (B-D SINGLE USE SWABS BUTTERFLY) PADS [id number] Yes Use up to 4 times a day to test blood sugars. Dx: E10.9, E11.9 Biagio Borg, MD Taking Active Self  aspirin 81 MG tablet [id number] Yes Take 1 tablet (81 mg total) by mouth daily. Regalado, Belkys A, MD Taking Active   atorvastatin (LIPITOR) 80 MG tablet [id number] Yes Take 1 tablet (80 mg total) by mouth at bedtime. Regalado, Belkys A, MD Taking Active   Blood Glucose Calibration (TRUE METRIX LEVEL 1) Low SOLN [id number] Yes Use as needed. Dx: E10.9, E11.9 Biagio Borg, MD Taking Active Self  blood glucose meter kit and supplies [id number] Yes Dispense based on patient and insurance preference. Use up to four times daily as directed. (FOR ICD-10 E10.9, E11.9). Biagio Borg, MD Taking Active Self           Med Note Olena Heckle, [facility]   Fri Mar 03, 2021 11:20 AM)    carvedilol (COREG) 25 MG tablet [id number] Yes TAKE 1 TABLET(25 MG) BY MOUTH TWICE DAILY WITH A MEAL Sherren Mocha, MD Taking Active   cetirizine (ZYRTEC) 10 MG tablet [id number] No TAKE 1 TABLET BY MOUTH DAILY  Patient not taking: Reported on 05/08/2021   Biagio Borg, MD Not Taking Active   clopidogrel (PLAVIX) 75 MG tablet [id number] Yes Take 1 tablet (75 mg total) by mouth daily. Regalado, Cassie Freer, MD Taking Active   empagliflozin (JARDIANCE) 10 MG TABS tablet [id number] Yes Take 1 tablet (10 mg total) by mouth daily before breakfast. [name] T, PA-C Taking Active Self  gabapentin (NEURONTIN) 300 MG capsule [id number] Yes TAKE 1 CAPSULE(300 MG) BY MOUTH THREE TIMES DAILY  Patient taking differently: 300 mg 2 (two) times daily.   Biagio Borg, MD Taking Active   glipiZIDE (GLUCOTROL) 5 MG tablet [id number] Yes Take 1 tablet (5 mg total) by mouth 2 (two) times daily. Regalado, Belkys A, MD Taking Active    glucose blood (TRUE METRIX BLOOD GLUCOSE TEST) test strip [id number] Yes Use to test blood sugars up to 4 times a day. DX: E10.9, E.11.9 Biagio Borg, MD Taking Active   hydrocortisone cream 1 % [id number] Yes Apply to affected area 2 times daily Sherwood Gambler, MD Taking Active Self  isosorbide mononitrate (IMDUR) 30 MG 24 hr tablet [id number] Yes TAKE 3 TABLETS(90 MG) BY MOUTH DAILY Biagio Borg, MD Taking Active   Lancets 33G MISC [id number] Yes Use to test blood sugars up to 4 times a day. DX: E10.9, E11.9 Biagio Borg, MD Taking Active   losartan (COZAAR) 50 MG tablet [id number] Yes Take 1 tablet (50 mg total) by mouth daily. [name] T, PA-C Taking Active   nitroGLYCERIN (NITROSTAT) 0.4 MG SL tablet [id number] Yes PLACE 1 TABLET UNDER THE TONGUE EVERY 5 MINUTES AS NEEDED FOR CHEST PAIN Biagio Borg, MD Taking Active   pantoprazole (PROTONIX) 40 MG tablet [id number] Yes Take 1 tablet (40 mg total) by mouth daily. Biagio Borg, MD Taking Active Self  PARoxetine (PAXIL) 20 MG tablet [id number] Yes Take 1 tablet (20 mg total) by mouth daily. Biagio Borg, MD Taking Active Self           Med Note Olena Heckle, [facility]   Fri Mar 03, 2021 11:21 AM)    potassium chloride (KLOR-CON) 10 MEQ tablet [id number] Yes Take 2 tablets (20 mEq total) by mouth 2 (two) times daily. [name] T, PA-C Taking Active Self  spironolactone (ALDACTONE) 25 MG tablet [id number]  Yes TAKE 1/2 TABLET(12.5 MG) BY MOUTH DAILY Sherren Mocha, MD Taking Active   torsemide (DEMADEX) 20 MG tablet [id number] Yes Take 1 tablet (20 mg total) by mouth 2 (two) times daily. [name] T, PA-C Taking Active Self  traMADol (ULTRAM) 50 MG tablet [id number] Yes TAKE 1 TABLET(50 MG) BY MOUTH EVERY 6 HOURS AS NEEDED  Patient taking differently: 50 mg. TAKE 1 TABLET(50 MG) BY MOUTH EVERY 6 HOURS AS NEEDED - uses up to 2 tablets daily   Biagio Borg, MD Taking Active   vitamin B-12 (CYANOCOBALAMIN) 1000 MCG tablet [id number]  Take 1 tablet  (1,000 mcg total) by mouth daily. Biagio Borg, MD  Active Self  Med List Note Eartha Inch, [place] 12/15/15 1042): CPAP            Patient Active Problem List   Diagnosis Date Noted   TIA (transient ischemic attack) 02/05/2021   History of stroke 01/2021   Aortic atherosclerosis ([place]) 12/26/2020   Cough 12/26/2020   Vitamin D deficiency 04/03/2020   B12 deficiency 04/03/2020   Itching 11/22/2019   Chronic heart failure with preserved ejection fraction (HFpEF) ([place]) 05/13/2019   Chronic coronary artery disease 05/13/2019   Elevated troponin 05/13/2019   Morbid obesity with BMI of 40.0-44.9, adult ([place]) 05/13/2019   Pre-ulcerative calluses 02/03/2019   Foot injury, left, initial encounter 11/26/2018   Chest pain 05/30/2017   Hypokalemia 05/30/2017   Allergic rhinitis 12/18/2016   Lumbar radiculitis 09/05/2016   Lumbar spinal stenosis 09/05/2016   CKD (chronic kidney disease), stage III ([place]) 12/29/2015   Chest pain syndrome 12/15/2015   Acute kidney injury ([place]) 12/15/2015   Atypical chest pain 12/15/2015   Encounter for well adult exam with abnormal findings 06/27/2015   Chronic low back pain 06/27/2015   Left lumbar radiculopathy 04/13/2015   Bilateral plantar fasciitis 04/13/2015   Essential hypertension 03/09/2015   OSA (obstructive sleep apnea) 03/27/2014   Peripheral neuropathy ([place]) 12/29/2013   External hemorrhoid, thrombosed 12/03/2013   Sinus bradycardia 11/15/2013   NSTEMI (non-ST elevated myocardial infarction) ([place]) 11/14/2013   Chronic combined systolic and diastolic CHF  68/34/1962   CAD S/P multiple PCI    Hyperlipidemia    DM2 (diabetes mellitus, type 2) ([place]) 06/26/2013   Gait disorder 06/26/2013   Obesity, Class III, BMI 40-49.9 (morbid obesity) ([place]) 09/10/2012   Tobacco abuse 09/02/2012   Ischemic cardiomyopathy- EF 50-55% June 2015    ST elevation myocardial infarction (STEMI) of anterior wall, initial episode of care ([place]) 09/01/2012    HEMORRHOIDS 12/26/2009   Acute prostatitis 12/26/2009   GANGLION CYST, WRIST, RIGHT 11/28/2009   COLONIC POLYPS 10/07/2009   Anxiety with depression 07/21/2009   DYSPNEA 03/24/2009   GASTROESOPHAGEAL REFLUX DISEASE 01/27/2007   PERCUTANEOUS TRANSLUMINAL CORONARY ANGIOPLASTY, HX OF 02/09/2006    Immunization History  Administered Date(s) Administered   Influenza Whole 04/12/2008, 04/12/2010, 02/10/2011   Influenza, Seasonal, Injecte, Preservative Fre 03/11/2013   Influenza,inj,Quad PF,6+ Mos 02/12/2015, 05/04/2016, 03/29/2017, 05/05/2018, 05/15/2019, 03/22/2020   Moderna Sars-Covid-2 Vaccination 11/10/2019, 12/07/2019, 05/16/2020   PFIZER(Purple Top)SARS-COV-2 Vaccination 11/10/2019   Pneumococcal Conjugate-13 06/26/2013   Pneumococcal Polysaccharide-23 07/21/2009, 06/27/2015   Td 03/24/2009   Zoster Recombinat (Shingrix) 04/02/2017    Conditions to be addressed/monitored:  Hypertension, Hyperlipidemia, Diabetes, Heart Failure, Coronary Artery Disease, Chronic Kidney Disease, Depression, and Chronic Pain  Care Plan : [place]  Updates made by Tomasa Blase, [place] since 05/08/2021 12:00 AM     Problem: CHL  AMB "PATIENT-SPECIFIC PROBLEM"      Long-Range Goal: Disease Management   Start Date: 05/08/2021  Expected End Date: 05/08/2022  This Visit's Progress: On track  Priority: High  Note:   Current Barriers:  Unable to independently monitor therapeutic efficacy Does not adhere to prescribed medication regimen  Pharmacist Clinical Goal(s):  Patient will achieve adherence to monitoring guidelines and medication adherence to achieve therapeutic efficacy achieve improvement in blood pressure and blood sugar control as evidenced by BP / BG logs through collaboration with PharmD and provider.   Interventions: 1:1 collaboration with Biagio Borg, MD regarding development and update of comprehensive plan of care as evidenced by provider attestation and  co-signature Inter-disciplinary care team collaboration (see longitudinal plan of care) Comprehensive medication review performed; medication list updated in electronic medical record  Hyperlipidemia/Coronary Artery Disease / previous CVA: (LDL goal < 70) -Not ideally controlled - has not been checked since switching to atorvastatin  Lab Results  Component Value Date   [place] [address] 02/06/2021  -Current treatment: Atorvastatin 80mg  - 1 tablet daily  Clopidogrel 75mg  - 1 tablet daily  ASA 81mg  - 1 tablet daily  Nitroglycerin 0.4mg  - 1 tablet every 5 minutes as needed for chest pain -Medications previously tried: pravastatin, rosuvastatin   -Current dietary patterns: notes that he avoids salting foods, but does admit to eating salty foods at times  -Current exercise habits: nothing at this time  -Educated on Cholesterol goals;  Benefits of statin for ASCVD risk reduction; Importance of limiting foods high in cholesterol; Exercise goal of 150 minutes per week; -Counseled on diet and exercise extensively Recommended to continue current medication  Diabetes (A1c goal <7%) -Uncontrolled Lab Results  Component Value Date   HGBA1C 10.6 (H) 02/06/2021  -Current medications: Glipizide 5mg  - 1 tablet twice daily  Jardiance 10mg - 1 tablet daily  -Medications previously tried: glipizide XL, glimepiride, metformin, metformin XR  -Current home glucose readings fasting glucose: has not been able to check recently because he does not have a meter  -Reports hypoglycemic/hyperglycemic symptoms -Current meal patterns:  breakfast: egg, sausage - does not typically   lunch: small sandwich if he eats anything at all  dinner: hamburger, fries  snacks: chips drinks: Pepsi -  typically drinking 2 x 16oz daily  -Current exercise: nothing at this time  -Educated on A1c and blood sugar goals; Complications of diabetes including kidney damage, retinal damage, and cardiovascular disease; Exercise goal of  150 minutes per week; Prevention and management of hypoglycemic episodes; Benefits of routine self-monitoring of blood sugar; Carbohydrate counting and/or plate method -Counseled to check feet daily and get yearly eye exams -Counseled on diet and exercise extensively Recommended to continue current medication -Will reach out to PCP for new BG meter to be sent to his mailorder pharmacy so he can begin checking BG again   Heart Failure (Goal: manage symptoms and prevent exacerbations)  / Hypertension (BP goal <140/90) -Controlled -Last ejection fraction: 45-50% (Date: 02/06/2021) -HF type: Diastolic -NYHA Class: II (slight limitation of activity) -Current treatment: Carvedilol 25mg  - 1 tablet twice daily  Losartan 50mg  - 1 tablet daily  Torsemide 20mg  - 1 tablet twice daily  Potassium Chloride 77mEq - 2 tablets twice daily  Jardiance 10mg  - 1 tablet daily  Isosorbide mononitrate 30mg  -3 tablets daily  Spironolactone 25mg  - 1/2 tablet daily  -Medications previously tried: amlodipine, benazepril, clonidine, furosemide,   -Current home BP/HR readings: unknown at this time  -Current dietary habits: reports that he does not  add salt to his foods but will each foods that can be high in sodium at times  -Current exercise habits: nothing at this time  -Educated on Benefits of medications for managing symptoms and prolonging life Importance of weighing daily; if you gain more than 3 pounds in one day or 5 pounds in one week, reach out to PCP/ Cardiology office Proper diuretic administration and potassium supplementation Importance of blood pressure control -Counseled on diet and exercise extensively Recommended to continue current medication  Chronic Kidney Disease (Goal: Prevention of disease progression) -stable  -Last eGFR: 49 mL/min  -Current treatment  Avoidance of nephrotoxic agents, gain control of BP / BG to prevent further damage  -Medications previously tried: n/a  -Recommended  to continue current medication   Depression (Goal: Promotion / maintenance of positive moode) -Controlled -Current treatment: Paroxetine 20mg  - 1 tablet daily  -Medications previously tried/failed: bupropion, citalopram, escitalopram -Educated on Benefits of medication for symptom control Benefits of cognitive-behavioral therapy with or without medication -Recommended to continue current medication  Chronic Pain / Peripheral neuropath / Lumbar radiculitis (Goal: Pain Management ) -Controlled -Current treatment  Tramadol 50mg  - 1 tablet every 6 hours as needed  Gabapentin 300mg  - 1 capsule 2 times daily  -Medications previously tried: APAP, norco, IBU, oxycodone  -Recommended to continue current medication  Health Maintenance -Vaccine gaps: Shingles, COVID booster, Influenza, Tdap -Current therapy:  Pantoprazole 40mg  - 1 tablet daily  Hydrocortisone 1% cream - applied twice daily  Vitamin B12 1028mcg -1 tablet daily  Cetirizine 10mg  - 1 tablet daily  -Educated on Cost vs benefit of each product must be carefully weighed by individual consumer -Patient is satisfied with current therapy and denies issues -Recommended for patient to complete COVID booster, Tdap, and influenza vaccine with next trip to pharmacy  - Shingles to be covered by insurance after the new year   Patient Goals/Self-Care Activities Patient will:  - take medications as prescribed as evidenced by patient report and record review check glucose daily, document, and provide at future appointments check blood pressure daily, document, and provide at future appointments weigh daily, and contact provider if weight gain of >3lbs in a day, or 5lbs in a week target a minimum of 150 minutes of moderate intensity exercise weekly engage in dietary modifications by reducing sodium intake / moderation of carb intake  Follow Up Plan: Telephone follow up appointment with care management team member scheduled for: 2 months The  patient has been provided with contact information for the care management team and has been advised to call with any health related questions or concerns.         Medication Assistance: None required.  Patient affirms current coverage meets needs.  Care Gaps: COVID booster, Influenza vaccine, Tdap, Shingles vaccine   Patient's preferred pharmacy is:  [id number] [place], [place] - [address] [place] [address] Phone: [phone] Fax: [phone]  [place], [place] [address] Phone: [phone] Fax: [phone]   Uses pill box? No - has pill boxes, has not bee using recently, is agreeable to restart  Pt endorses 70-75% compliance  Care Plan and Follow Up Patient Decision:  Patient agrees to Care Plan and Follow-up.  Plan: Telephone follow up appointment with care management team member scheduled for:  2 months The patient has been provided with contact  information for the care management team and has been advised to call with any health related questions or concerns.   Tomasa Blase, PharmD Clinical Pharmacist, [place]

## 2021-05-08 NOTE — Patient Instructions (Signed)
Visit Information   Following is a copy of your full care plan:  Care Plan : [place]  Updates made by Tomasa Blase, RPH since 05/08/2021 12:00 AM     Problem: CHL AMB "PATIENT-SPECIFIC PROBLEM"      Long-Range Goal: Disease Management   Start Date: 05/08/2021  Expected End Date: 05/08/2022  This Visit's Progress: On track  Priority: High  Note:   Current Barriers:  Unable to independently monitor therapeutic efficacy Does not adhere to prescribed medication regimen  Pharmacist Clinical Goal(s):  Patient will achieve adherence to monitoring guidelines and medication adherence to achieve therapeutic efficacy achieve improvement in blood pressure and blood sugar control as evidenced by BP / BG logs through collaboration with PharmD and provider.   Interventions: 1:1 collaboration with Biagio Borg, MD regarding development and update of comprehensive plan of care as evidenced by provider attestation and co-signature Inter-disciplinary care team collaboration (see longitudinal plan of care) Comprehensive medication review performed; medication list updated in electronic medical record  Hyperlipidemia/Coronary Artery Disease / previous CVA: (LDL goal < 70) -Not ideally controlled - has not been checked since switching to atorvastatin  Lab Results  Component Value Date   Bergoo 85 02/06/2021  -Current treatment: Atorvastatin 80mg  - 1 tablet daily  Clopidogrel 75mg  - 1 tablet daily  ASA 81mg  - 1 tablet daily  Nitroglycerin 0.4mg  - 1 tablet every 5 minutes as needed for chest pain -Medications previously tried: pravastatin, rosuvastatin   -Current dietary patterns: notes that he avoids salting foods, but does admit to eating salty foods at times  -Current exercise habits: nothing at this time  -Educated on Cholesterol goals;  Benefits of statin for ASCVD risk reduction; Importance of limiting foods high in cholesterol; Exercise goal of 150 minutes per  week; -Counseled on diet and exercise extensively Recommended to continue current medication  Diabetes (A1c goal <7%) -Uncontrolled Lab Results  Component Value Date   HGBA1C 10.6 (H) 02/06/2021  -Current medications: Glipizide 5mg  - 1 tablet twice daily  Jardiance 10mg - 1 tablet daily  -Medications previously tried: glipizide XL, glimepiride, metformin, metformin XR  -Current home glucose readings fasting glucose: has not been able to check recently because he does not have a meter  -Reports hypoglycemic/hyperglycemic symptoms -Current meal patterns:  breakfast: egg, sausage - does not typically   lunch: small sandwich if he eats anything at all  dinner: hamburger, fries  snacks: chips drinks: [employer]eps[employer] -  typically drinking 2 x 16oz daily  -Current exercise: nothing at this time  -Educated on A1c and blood sugar goals; Complications of diabetes including kidney damage, retinal damage, and cardiovascular disease; Exercise goal of 150 minutes per week; Prevention and management of hypoglycemic episodes; Benefits of routine self-monitoring of blood sugar; Carbohydrate counting and/or plate method -Counseled to check feet daily and get yearly eye exams -Counseled on diet and exercise extensively Recommended to continue current medication -Will reach out to PCP for new BG meter to be sent to his mailorder pharmacy so he can begin checking BG again   Heart Failure (Goal: manage symptoms and prevent exacerbations)  / Hypertension (BP goal <140/90) -Controlled -Last ejection fraction: 45-50% (Date: 02/06/2021) -HF type: Diastolic -NYHA Class: II (slight limitation of activity) -Current treatment: Carvedilol 25mg  - 1 tablet twice daily  Losartan 50mg  - 1 tablet daily  Torsemide 20mg  - 1 tablet twice daily  Potassium Chloride 61mEq - 2 tablets twice daily  Jardiance 10mg  - 1 tablet daily  Isosorbide mononitrate  30mg  -3 tablets daily  Spironolactone 25mg  - 1/2 tablet daily   -Medications previously tried: amlodipine, benazepril, clonidine, furosemide,   -Current home BP/HR readings: unknown at this time  -Current dietary habits: reports that he does not add salt to his foods but will each foods that can be high in sodium at times  -Current exercise habits: nothing at this time  -Educated on Benefits of medications for managing symptoms and prolonging life Importance of weighing daily; if you gain more than 3 pounds in one day or 5 pounds in one week, reach out to PCP/ Cardiology office Proper diuretic administration and potassium supplementation Importance of blood pressure control -Counseled on diet and exercise extensively Recommended to continue current medication  Chronic Kidney Disease (Goal: Prevention of disease progression) -stable  -Last eGFR: 49 mL/min  -Current treatment  Avoidance of nephrotoxic agents, gain control of BP / BG to prevent further damage  -Medications previously tried: n/a  -Recommended to continue current medication   Depression (Goal: Promotion / maintenance of positive moode) -Controlled -Current treatment: Paroxetine 20mg  - 1 tablet daily  -Medications previously tried/failed: bupropion, citalopram, escitalopram -Educated on Benefits of medication for symptom control Benefits of cognitive-behavioral therapy with or without medication -Recommended to continue current medication  Chronic Pain / Peripheral neuropath / Lumbar radiculitis (Goal: Pain Management ) -Controlled -Current treatment  Tramadol 50mg  - 1 tablet every 6 hours as needed  Gabapentin 300mg  - 1 capsule 2 times daily  -Medications previously tried: APAP, norco, IBU, oxycodone  -Recommended to continue current medication  Health Maintenance -Vaccine gaps: Shingles, COVID booster, Influenza, Tdap -Current therapy:  Pantoprazole 40mg  - 1 tablet daily  Hydrocortisone 1% cream - applied twice daily  Vitamin B12 1029mcg -1 tablet daily  Cetirizine 10mg  -  1 tablet daily  -Educated on Cost vs benefit of each product must be carefully weighed by individual consumer -Patient is satisfied with current therapy and denies issues -Recommended for patient to complete COVID booster, Tdap, and influenza vaccine with next trip to pharmacy  - Shingles to be covered by insurance after the new year   Patient Goals/Self-Care Activities Patient will:  - take medications as prescribed as evidenced by patient report and record review check glucose daily, document, and provide at future appointments check blood pressure daily, document, and provide at future appointments weigh daily, and contact provider if weight gain of >3lbs in a day, or 5lbs in a week target a minimum of 150 minutes of moderate intensity exercise weekly engage in dietary modifications by reducing sodium intake / moderation of carb intake  Follow Up Plan: Telephone follow up appointment with care management team member scheduled for: 2 months The patient has been provided with contact information for the care management team and has been advised to call with any health related questions or concerns.      Consent to CCM Services: Mr. Massi was given information about Chronic Care Management services including:  CCM service includes personalized support from designated clinical staff supervised by his physician, including individualized plan of care and coordination with other care providers 24/7 contact phone numbers for assistance for urgent and routine care needs. Service will only be billed when office clinical staff spend 20 minutes or more in a month to coordinate care. Only one practitioner may furnish and bill the service in a calendar month. The patient may stop CCM services at any time (effective at the end of the month) by phone call to the office staff. The patient  will be responsible for cost sharing (co-pay) of up to 20% of the service fee (after annual deductible is  met).  Patient agreed to services and verbal consent obtained.   Plan: Telephone follow up appointment with care management team member scheduled for:  2 months The patient has been provided with contact information for the care management team and has been advised to call with any health related questions or concerns.   Tomasa Blase, PharmD Clinical Pharmacist, [facility]   Please call the care guide team at [phone] if you need to cancel or reschedule your appointment.   Patient verbalizes understanding of instructions provided today and agrees to view in [place].

## 2021-05-10 DIAGNOSIS — Z7984 Long term (current) use of oral hypoglycemic drugs: Secondary | ICD-10-CM

## 2021-05-10 DIAGNOSIS — I5042 Chronic combined systolic (congestive) and diastolic (congestive) heart failure: Secondary | ICD-10-CM

## 2021-05-10 DIAGNOSIS — N1831 Chronic kidney disease, stage 3a: Secondary | ICD-10-CM | POA: Diagnosis not present

## 2021-05-10 DIAGNOSIS — E782 Mixed hyperlipidemia: Secondary | ICD-10-CM

## 2021-05-10 DIAGNOSIS — E1159 Type 2 diabetes mellitus with other circulatory complications: Secondary | ICD-10-CM

## 2021-05-10 DIAGNOSIS — E1122 Type 2 diabetes mellitus with diabetic chronic kidney disease: Secondary | ICD-10-CM

## 2021-05-23 ENCOUNTER — Inpatient Hospital Stay: Payer: Medicare HMO | Admitting: Neurology

## 2021-05-23 ENCOUNTER — Other Ambulatory Visit: Payer: Self-pay | Admitting: Internal Medicine

## 2021-05-23 DIAGNOSIS — I2510 Atherosclerotic heart disease of native coronary artery without angina pectoris: Secondary | ICD-10-CM

## 2021-05-23 NOTE — Telephone Encounter (Signed)
Please refill as per office routine med refill policy (all routine meds to be refilled for 3 mo or monthly (per pt preference) up to one year from last visit, then month to month grace period for 3 mo, then further med refills will have to be denied) ? ?

## 2021-05-24 ENCOUNTER — Ambulatory Visit: Payer: Medicare HMO | Admitting: Physician Assistant

## 2021-05-29 ENCOUNTER — Ambulatory Visit (INDEPENDENT_AMBULATORY_CARE_PROVIDER_SITE_OTHER): Payer: Medicare HMO | Admitting: *Deleted

## 2021-05-29 DIAGNOSIS — E1159 Type 2 diabetes mellitus with other circulatory complications: Secondary | ICD-10-CM

## 2021-05-29 DIAGNOSIS — I5042 Chronic combined systolic (congestive) and diastolic (congestive) heart failure: Secondary | ICD-10-CM

## 2021-05-29 NOTE — Chronic Care Management (AMB) (Signed)
Chronic Care Management   CCM RN Visit Note  05/29/2021 Name: [name] [name] MRN: [id number] DOB: 09-11-1955  Subjective: [name] [name] is a 65 y.o. year old male who is a primary care patient of Biagio Borg, MD. The care management team was consulted for assistance with disease management and care coordination needs.    Engaged with patient by telephone for follow up visit in response to provider referral for case management and/or care coordination services.   Consent to Services:  The patient was given information about Chronic Care Management services, agreed to services, and gave verbal consent prior to initiation of services.  Please see initial visit note for detailed documentation.  Patient agreed to services and verbal consent obtained.   Assessment: Review of patient past medical history, allergies, medications, health status, including review of consultants reports, laboratory and other test data, was performed as part of comprehensive evaluation and provision of chronic care management services.   SDOH (Social Determinants of Health) assessments and interventions performed:  SDOH Interventions    Flowsheet Row Most Recent Value  SDOH Interventions   Food Insecurity Interventions --  [Community resource care guide referral placed]  Housing Interventions Intervention Not Indicated  [continues living alone in apartment]  Transportation Interventions Intervention Not Indicated  [repots his vehicle is not operational- currently getting rides with friends,  denies transportation needs- hoping to get vehicle repaired "soon"]      CCM Care Plan Allergies  Allergen Reactions   Penicillin G Other (See Comments)    Did it involve swelling of the face/tongue/throat, SOB, or low BP?  N/A Did it involve sudden or severe rash/hives, skin peeling, or any reaction on the inside of your mouth or nose? N/A Did you need to seek medical attention at a hospital or doctor's office?  N/A When did it last happen? Child If all above answers are NO, may proceed with cephalosporin use.   Penicillins Other (See Comments)    Did it involve swelling of the face/tongue/throat, SOB, or low BP? N/A Did it involve sudden or severe rash/hives, skin peeling, or any reaction on the inside of your mouth or nose?N/A Did you need to seek medical attention at a hospital or doctor's office? N/A When did it last happen? Child      If all above answers are NO, may proceed with cephalosporin use.   Outpatient Encounter Medications as of 05/29/2021  Medication Sig   isosorbide mononitrate (IMDUR) 30 MG 24 hr tablet TAKE 3 TABLETS(90 MG) BY MOUTH DAILY   Alcohol Swabs (B-D SINGLE USE SWABS BUTTERFLY) PADS Use up to 4 times a day to test blood sugars. Dx: E10.9, E11.9   aspirin 81 MG tablet Take 1 tablet (81 mg total) by mouth daily.   atorvastatin (LIPITOR) 80 MG tablet Take 1 tablet (80 mg total) by mouth at bedtime.   Blood Glucose Calibration (TRUE METRIX LEVEL 1) Low SOLN Use as needed. Dx: E10.9, E11.9   blood glucose meter kit and supplies Dispense based on patient and insurance preference. Use up to four times daily as directed. (FOR ICD-10 E10.9, E11.9).   carvedilol (COREG) 25 MG tablet TAKE 1 TABLET(25 MG) BY MOUTH TWICE DAILY WITH A MEAL   cetirizine (ZYRTEC) 10 MG tablet TAKE 1 TABLET BY MOUTH DAILY (Patient not taking: Reported on 05/08/2021)   clopidogrel (PLAVIX) 75 MG tablet Take 1 tablet (75 mg total) by mouth daily.   empagliflozin (JARDIANCE) 10 MG TABS tablet Take 1 tablet (10 mg total)  by mouth daily before breakfast.   gabapentin (NEURONTIN) 300 MG capsule TAKE 1 CAPSULE(300 MG) BY MOUTH THREE TIMES DAILY (Patient taking differently: 300 mg 2 (two) times daily.)   glipiZIDE (GLUCOTROL) 5 MG tablet Take 1 tablet (5 mg total) by mouth 2 (two) times daily.   glucose blood (TRUE METRIX BLOOD GLUCOSE TEST) test strip Use to test blood sugars up to 4 times a day. DX: E10.9,  E.11.9   hydrocortisone cream 1 % Apply to affected area 2 times daily   Lancets 33G MISC Use to test blood sugars up to 4 times a day. DX: E10.9, E11.9   losartan (COZAAR) 50 MG tablet Take 1 tablet (50 mg total) by mouth daily.   nitroGLYCERIN (NITROSTAT) 0.4 MG SL tablet PLACE 1 TABLET UNDER THE TONGUE EVERY 5 MINUTES AS NEEDED FOR CHEST PAIN   pantoprazole (PROTONIX) 40 MG tablet Take 1 tablet (40 mg total) by mouth daily.   PARoxetine (PAXIL) 20 MG tablet Take 1 tablet (20 mg total) by mouth daily.   potassium chloride (KLOR-CON) 10 MEQ tablet Take 2 tablets (20 mEq total) by mouth 2 (two) times daily.   spironolactone (ALDACTONE) 25 MG tablet TAKE 1/2 TABLET(12.5 MG) BY MOUTH DAILY   torsemide (DEMADEX) 20 MG tablet Take 1 tablet (20 mg total) by mouth 2 (two) times daily.   traMADol (ULTRAM) 50 MG tablet TAKE 1 TABLET(50 MG) BY MOUTH EVERY 6 HOURS AS NEEDED (Patient taking differently: 50 mg. TAKE 1 TABLET(50 MG) BY MOUTH EVERY 6 HOURS AS NEEDED - uses up to 2 tablets daily)   vitamin B-12 (CYANOCOBALAMIN) 1000 MCG tablet Take 1 tablet (1,000 mcg total) by mouth daily.   No facility-administered encounter medications on file as of 05/29/2021.   Patient Active Problem List   Diagnosis Date Noted   TIA (transient ischemic attack) 02/05/2021   History of stroke 01/2021   Aortic atherosclerosis (HCC) 12/26/2020   Cough 12/26/2020   Vitamin D deficiency 04/03/2020   B12 deficiency 04/03/2020   Itching 11/22/2019   Chronic heart failure with preserved ejection fraction (HFpEF) (HCC) 05/13/2019   Chronic coronary artery disease 05/13/2019   Elevated troponin 05/13/2019   Morbid obesity with BMI of 40.0-44.9, adult (HCC) 05/13/2019   Pre-ulcerative calluses 02/03/2019   Foot injury, left, initial encounter 11/26/2018   Chest pain 05/30/2017   Hypokalemia 05/30/2017   Allergic rhinitis 12/18/2016   Lumbar radiculitis 09/05/2016   Lumbar spinal stenosis 09/05/2016   CKD (chronic  kidney disease), stage III (HCC) 12/29/2015   Chest pain syndrome 12/15/2015   Acute kidney injury (HCC) 12/15/2015   Atypical chest pain 12/15/2015   Encounter for well adult exam with abnormal findings 06/27/2015   Chronic low back pain 06/27/2015   Left lumbar radiculopathy 04/13/2015   Bilateral plantar fasciitis 04/13/2015   Essential hypertension 03/09/2015   OSA (obstructive sleep apnea) 03/27/2014   Peripheral neuropathy (HCC) 12/29/2013   External hemorrhoid, thrombosed 12/03/2013   Sinus bradycardia 11/15/2013   NSTEMI (non-ST elevated myocardial infarction) (HCC) 11/14/2013   Chronic combined systolic and diastolic CHF  11/14/2013   CAD S/P multiple PCI    Hyperlipidemia    DM2 (diabetes mellitus, type 2) (HCC) 06/26/2013   Gait disorder 06/26/2013   Obesity, Class III, BMI 40-49.9 (morbid obesity) (HCC) 09/10/2012   Tobacco abuse 09/02/2012   Ischemic cardiomyopathy- EF 50-55% June 2015    ST elevation myocardial infarction (STEMI) of anterior wall, initial episode of care (HCC) 09/01/2012   HEMORRHOIDS 12/26/2009  Acute prostatitis 12/26/2009   GANGLION CYST, WRIST, RIGHT 11/28/2009   COLONIC POLYPS 10/07/2009   Anxiety with depression 07/21/2009   DYSPNEA 03/24/2009   GASTROESOPHAGEAL REFLUX DISEASE 01/27/2007   PERCUTANEOUS TRANSLUMINAL CORONARY ANGIOPLASTY, HX OF 02/09/2006   Conditions to be addressed/monitored:  CHF and DMII  Care Plan : RN Care Manager Plan of Care  Updates made by Knox Royalty, RN since 05/29/2021 12:00 AM     Problem: Chronic Disease Management Needs   Priority: High     Long-Range Goal: Development of plan of care for long term chronic disease management   Start Date: 01/25/2021  Expected End Date: 01/25/2022  Priority: High  Note:   Current Barriers:  Chronic Disease Management support and education needs related to CHF and DMII Literacy barriers- patient reports needs assistance understanding health care advice 05/29/21--  Has still not obtained glucometer/ scales from insurance provider: confirmed patient is working with [place] team for prescription needs: he reports he is "waiting" to receive glucometer after recent CCM pharmacy visit 05/08/21 at which time, CCM pharmacist requested new order/ prescription for glucometer Recent hospitalization (observation) for TIA August 28-29, 2022  RNCM Clinical Goal(s):  Patient will demonstrate improved adherence to prescribed treatment plan for CHF and DMII as evidenced by daily monitoring and recording of CBG  adherence to ADA/ carb modified diet adherence to prescribed medication regimen contacting provider for new or worsened symptoms or questions monitoring and recording daily weights at home    through collaboration with RN Care manager, provider, and care team.   Interventions: 1:1 collaboration with primary care provider regarding development and update of comprehensive plan of care as evidenced by provider attestation and co-signature Inter-disciplinary care team collaboration (see longitudinal plan of care) Evaluation of current treatment plan related to  self management and patient's adherence to plan as established by provider SDOH/ depression screening updated: food insecurity persists; patient requests community resource care guide referral: placed  Heart Failure Interventions:  (Status: Goal on Track (progressing): YES.) Assessed need for readable accurate scales in home Discussed importance of daily weight and advised patient to weigh and record daily Discussed the importance of keeping all appointments with provider Screening for signs and symptoms of depression related to chronic disease state  Assessed social determinant of health barriers Patient again reports has still been unable to buy new scales, nor has he been provided scales from insurance provider: we reviewed signs/ symptoms CHF yellow zone: he denies all today; reinforced previously provided  education around signs/ symptoms and corresponding action plan: he requires ongoing reinforcement and support Denies recent issues around breathing status: continues walking regularly, reports tolerating well Confirmed patient now has scheduled post- recent hospital neurology provider office visit; confirms has plans to attend as scheduled Reviewed recent CCM pharmacy visit: reports no concerns/ issues problems with medications today, "just need that blood sugar monitor"  Diabetes:  (Status: Goal on Track (progressing): YES.) Lab Results  Component Value Date   HGBA1C 10.6 (H) 02/06/2021  Discussed plans with patient for ongoing care management follow up and provided patient with direct contact information for care management team;      Reviewed scheduled/upcoming provider appointments including: 05/31/21- podiatrist; 06/21/20- cardiology provider; 1/23/223- PCP office visit; 07/04/21- CCM Pharmacy visit; 07/19/21- neurology provider (new patient) visit;         Referral made to community resources care guide team for assistance with ongoing food insecurity;      Patient again reports has not yet received  necessary supplies for blood sugar monitoring at home- states he has "not heard a word" from insurance company after CCM Pharmacist also attempted to obtain new prescription for same Reports no recent episode of signs/ symptoms hypoglycemia, we again reviewed signs/ symptoms hypoglycemia along with corresponding action plan; patient will need ongoing support and reinforcement of same Confirmed "trying" to follow prescribed diet for HTN/ HLD/ DMII: reinforced dietary strategies for adherence Confirmed no new/ recent falls- patient continues to use cane prn-- positive reinforcement provided with encouragement to continue efforts Confirmed patient has still not received flu vaccine for 2022-23 flu/ winter season-- he was encouraged to obtain flu vaccine promptly; verbalized agreement  Patient  Goals/Self-Care Activities: As evidenced by review of EHR, collaboration with care team, and patient reporting during CCM RN CM outreach,  Patient [name] [name] will: Take medications as prescribed and continue to work with the Pharmacy team at Dr. Quay Burow' office Attend all scheduled provider appointments Call pharmacy for medication refills Call provider office for new concerns or questions Once a glucometer (blood sugar meter) is obtained, patient will begin checking and writing down on paper blood sugars first thing in the morning before eating and then again during the day 2 hours after eating a regular meal- please keep working with your insurance company and the Pharmacy team to make sure you have a prescription for the blood sugar meter Once scales are obtained, patient will begin monitoring and writing down on paper weights at home every day Obtain a flu vaccine for the 2022-2023 flu season- please take steps to obtain your flu vaccine if you have not already done so Continue to take precautions to prevent falls Continue to do your best to follow a heart healthy, low salt, low carbohydrate/ low sugar diet        Plan: Telephone follow up appointment with care management team member scheduled for:  Friday, July 21, 2021 at 9:00 am The patient has been provided with contact information for the care management team and has been advised to call with any health related questions or concerns  [name], RN, BSN, [place] [phone]: direct office

## 2021-05-29 NOTE — Patient Instructions (Signed)
Visit [place], thank you for taking time to talk with me today. Please don't hesitate to contact me if I can be of assistance to you before our next scheduled telephone appointment.  Below are the goals we discussed today:  Patient Self-Care Activities: Patient [name] [name] will: Take medications as prescribed and continue to work with the Pharmacy team at Dr. Quay Burow' office Attend all scheduled provider appointments Call pharmacy for medication refills Call provider office for new concerns or questions Once a glucometer (blood sugar meter) is obtained, patient will begin checking and writing down on paper blood sugars first thing in the morning before eating and then again during the day 2 hours after eating a regular meal- please keep working with your insurance company and the Pharmacy team to make sure you have a prescription for the blood sugar meter Once scales are obtained, patient will begin monitoring and writing down on paper weights at home every day Obtain a flu vaccine for the 2022-2023 flu season- please take steps to obtain your flu vaccine if you have not already done so Continue to take precautions to prevent falls Continue to do your best to follow a heart healthy, low salt, low carbohydrate/ low sugar diet  Our next scheduled telephone follow up visit/ appointment with care management team member is scheduled on:  Friday, July 21, 2021 at 9:00 am  If you need to cancel or re-schedule our visit, please call [phone] and our care guide team will be happy to assist you.   I look forward to hearing about your progress.   [name], RN, BSN, [place] [phone]: direct office  If you are experiencing a Mental Health or [place] or need someone to talk to, please  call the Suicide and Crisis Lifeline: 988 call the [place] National Suicide Prevention Lifeline: 414-214-2514  or TTY: 260-023-4153 TTY 805-340-4909) to talk to a trained counselor call 1-800-273-TALK (toll free, 24 hour hotline) go to [facility] [address] [place] [phone]) call 911   The patient verbalized understanding of instructions, educational materials, and care plan provided today and agreed to receive a mailed copy of patient instructions, educational materials, and care plan.   Preventing Influenza, Adult Influenza, also known as the flu, is an infection caused by a virus. The flu mainly affects the nose, throat, and lungs (respiratory system). This infection causes common cold symptoms, as well as a high fever and body aches. The flu spreads easily from person to person (is contagious). The flu is most common from December through March. This period of time is called flu season. You can catch the flu virus by: Breathing in droplets from an infected person's cough or sneeze. Touching something that recently got the virus on it (is contaminated) and then touching your mouth, nose, or eyes. How can this condition affect me? If you get the flu, your friends, family, and co-workers are also at risk of getting it because the flu spreads easily to others. Having the flu can lead to complications, such as pneumonia, ear infection, and sinus infection. The flu also can be life-threatening, especially for babies, people older than age 52, and people who have serious long-term diseases. You can protect yourself and other people by: Keeping your body's defense system (immune system) strong. Getting a flu shot, or influenza vaccination, each year. Practicing good health habits. What can increase my risk? The following factors  may make you more likely to get the flu: Not washing or sanitizing your hands often. Having close contact with many people during cold and flu season. Touching your mouth, eyes, or nose without first washing or sanitizing your  hands. Not getting a flu shot each year. Working in health care. You may be at risk for more serious flu symptoms if: You are older than age 40. You are pregnant. Your immune system is weak. You have a condition that makes the flu worse or life-threatening. You have severe obesity. What actions can I take to prevent this? Lifestyle You can lower your risk of getting the flu by keeping your immune system in good shape. Do this by: Eating a healthy diet. Drinking plenty of fluids. Drink enough fluid to keep your urine pale yellow. Getting enough sleep. Exercising regularly. Do not use any products that contain nicotine or tobacco. These products include cigarettes, chewing tobacco, and vaping devices, such as e-cigarettes. If you need help quitting, ask your health care provider. Medicines Getting a flu shot every year can lower your risk of getting the flu. This is the best way to prevent the flu. A flu shot is recommended for everyone age 60 months or older. It is best to get a flu shot in the fall, as soon as it is available. But getting a flu shot during winter or spring instead is still a good idea. Flu season can last into early spring. Preventing the flu through vaccination means getting a new flu shot every year. This is because the flu virus changes slightly (mutates) from one year to the next. The flu shot does not completely protect you from all flu virus mutations. If you get the flu after you get the shot, the shot can make your illness milder and go away sooner. It also can prevent dangerous complications of the flu. If you are pregnant, you can and should get a flu shot. Check with your health care provider before getting a flu shot if you have had a reaction to the shot in the past or if you are allergic to eggs. Sometimes the vaccine is available as a nasal spray. In some years, the nasal spray has not been as effective against the flu virus. Check with your health care provider  if you have questions about this. Most people recover from the flu by resting at home and drinking plenty of fluids. However, a prescription antiviral medicine may reduce your flu symptoms and may make your flu go away sooner. This medicine must be started within a few days of getting flu symptoms. Antiviral medicine may be prescribed for people who are at risk for more serious flu symptoms. Talk with your health care provider about whether you need an antiviral medicine.  General information   You can also lower your risk of getting the flu by practicing good health habits. This is especially important during flu season. Avoid contact with people who are sick with flu or cold symptoms. Wash your hands with soap and water often and for at least 20 seconds. If soap and water are not available, use alcohol-based hand sanitizer. Avoid touching your hands to your face, especially when you have not washed your hands recently. Use a cleanser that kills germs (a disinfectant) to clean surfaces at home and at work that may have the flu virus on them. If you do get the flu, avoid spreading it to others by: Staying home until your symptoms, including your fever, have  been gone for at least 24 hours. Covering your mouth and nose when you cough or sneeze. Avoiding close contact with others, especially babies and older people.  Where to find more information Centers for Disease Control and Prevention: http://www.wolf.info/ American Academy of Family Physicians: www.familydoctor.org Contact a health care provider if: You have the flu and you develop new symptoms. You have diarrhea. You have a fever. Your cough gets worse, or you produce more mucus. Get help right away if you: Have trouble breathing. Have chest pain. These symptoms may represent a serious problem that is an emergency. Do not wait to see if the symptoms will go away. Get medical help right away. Call your local emergency services (911 in the  [place] Do not drive yourself to the hospital. Summary The best way to prevent the flu is to get a flu shot (influenza vaccination) every year in the fall. Even if you get the flu after you have received the yearly flu shot, your flu may be milder and go away sooner because of your flu shot. If you get the flu, antiviral medicines that are started within a few days of flu symptoms may reduce your symptoms and reduce how long the flu lasts. You can also help prevent the flu by practicing good health habits. This information is not intended to replace advice given to you by your health care provider. Make sure you discuss any questions you have with your health care provider. Document Revised: 01/15/2020 Document Reviewed: 01/15/2020 Elsevier Patient Education  [place].  Influenza (Flu) Vaccine (Inactivated or Recombinant): What You Need to Know 1. Why get vaccinated? Influenza vaccine can prevent influenza (flu). Flu is a contagious disease that spreads around the [place] every year, usually between October and May. Anyone can get the flu, but it is more dangerous for some people. Infants and young children, people 64 years and older, pregnant people, and people with certain health conditions or a weakened immune system are at greatest risk of flu complications. Pneumonia, bronchitis, sinus infections, and ear infections are examples of flu-related complications. If you have a medical condition, such as heart disease, cancer, or diabetes, flu can make it worse. Flu can cause fever and chills, sore throat, muscle aches, fatigue, cough, headache, and runny or stuffy nose. Some people may have vomiting and diarrhea, though this is more common in children than adults. In an average year, thousands of people in the [place] die from flu, and many more are hospitalized. Flu vaccine prevents millions of illnesses and flu-related visits to the doctor each year. 2. Influenza vaccines CDC  recommends everyone 6 months and older get vaccinated every flu season. Children 6 months through 34 years of age may need 2 doses during a single flu season. Everyone else needs only 1 dose each flu season. It takes about 2 weeks for protection to develop after vaccination. There are many flu viruses, and they are always changing. Each year a new flu vaccine is made to protect against the influenza viruses believed to be likely to cause disease in the upcoming flu season. Even when the vaccine doesn't exactly match these viruses, it may still provide some protection. Influenza vaccine does not cause flu. Influenza vaccine may be given at the same time as other vaccines. 3. Talk with your health care provider Tell your vaccination provider if the person getting the vaccine: Has had an allergic reaction after a previous dose of influenza vaccine, or has any severe, life-threatening allergies  Has ever had Guillain-Barr Syndrome (also called "GBS") In some cases, your health care provider may decide to postpone influenza vaccination until a future visit. Influenza vaccine can be administered at any time during pregnancy. People who are or will be pregnant during influenza season should receive inactivated influenza vaccine. People with minor illnesses, such as a cold, may be vaccinated. People who are moderately or severely ill should usually wait until they recover before getting influenza vaccine. Your health care provider can give you more information. 4. Risks of a vaccine reaction Soreness, redness, and swelling where the shot is given, fever, muscle aches, and headache can happen after influenza vaccination. There may be a very small increased risk of Guillain-Barr Syndrome (GBS) after inactivated influenza vaccine (the flu shot). Young children who get the flu shot along with pneumococcal vaccine (PCV13) and/or DTaP vaccine at the same time might be slightly more likely to have a seizure caused  by fever. Tell your health care provider if a child who is getting flu vaccine has ever had a seizure. People sometimes faint after medical procedures, including vaccination. Tell your provider if you feel dizzy or have vision changes or ringing in the ears. As with any medicine, there is a very remote chance of a vaccine causing a severe allergic reaction, other serious injury, or death. 5. What if there is a serious problem? An allergic reaction could occur after the vaccinated person leaves the clinic. If you see signs of a severe allergic reaction (hives, swelling of the face and throat, difficulty breathing, a fast heartbeat, dizziness, or weakness), call 9-1-1 and get the person to the nearest hospital. For other signs that concern you, call your health care provider. Adverse reactions should be reported to the Vaccine Adverse Event Reporting System (VAERS). Your health care provider will usually file this report, or you can do it yourself. Visit the VAERS website at www.vaers.SamedayNews.es or call 838-086-4053. VAERS is only for reporting reactions, and VAERS staff members do not give medical advice. 6. The National Vaccine Injury Compensation Program The [employer] Vaccine Injury Compensation Program (VICP) is a federal program that was created to compensate people who may have been injured by certain vaccines. Claims regarding alleged injury or death due to vaccination have a time limit for filing, which may be as short as two years. Visit the VICP website at GoldCloset.com.ee or call 239-368-8259 to learn about the program and about filing a claim. 7. How can I learn more? Ask your health care provider. Call your local or state health department. Visit the website of the Food and Drug Administration (FDA) for vaccine package inserts and additional information at TraderRating.uy. Contact the Centers for Disease Control and Prevention (CDC): Call  502-041-8104 (1-800-CDC-INFO) or Visit CDC's website at https://gibson.com/. Vaccine Information Statement Inactivated Influenza Vaccine (01/15/2020) This information is not intended to replace advice given to you by your health care provider. Make sure you discuss any questions you have with your health care provider. Document Revised: 02/16/2021 Document Reviewed: 02/16/2021 Elsevier Patient Education  2022 [employer].

## 2021-05-31 ENCOUNTER — Ambulatory Visit: Payer: Medicare HMO | Admitting: Podiatry

## 2021-06-10 DIAGNOSIS — E1159 Type 2 diabetes mellitus with other circulatory complications: Secondary | ICD-10-CM | POA: Diagnosis not present

## 2021-06-10 DIAGNOSIS — I5042 Chronic combined systolic (congestive) and diastolic (congestive) heart failure: Secondary | ICD-10-CM | POA: Diagnosis not present

## 2021-06-15 NOTE — Progress Notes (Deleted)
Cardiology Office Note    Date:  06/15/2021   ID:  [name] [name], DOB 12-Nov-1955, MRN [id number]   PCP:  Biagio Borg, MD   [place]  Cardiologist:  Sherren Mocha, MD *** Advanced Practice Provider:  Liliane Shi, PA-C Electrophysiologist:  None   [id number]}   No chief complaint on file.   History of Present Illness:  [name] [name] is a 66 y.o. male  with history of CADCoronary artery disease S/p PCI to RCA and PL1, S/p cutting POBA to RCA 2/2 ISR in 5/08, S/p ant STEMI in 3/14 >> PCI: DES to LAD, S/p NSTEMI in 6/15 >> PCI: DES to OM1, S/p NSTEMI 10/15 >> Med Rx, Myoview 1/17: no ischemia, Cath at [facility] 05/2019: RCA 100 CTO,, Ischemic CM,Supraventricular tachycardia, PVCs (monitor 9/21: 6% burden), Hypertension,Hyperlipidemia, Chronic kidney disease, Diabetes mellitus,OSA, GERD, Tobacco abuse, TIA 01/2021. Echo LVEF 45-50% global HK grade 1DD, Zio 03/2021 no Afib, PVC burden low.   Last saw [name] 03/03/21 and BP uncontrolled. Losartan started. Zio ordered and results below.    Past Medical History:  Diagnosis Date   CAD (coronary artery disease)    a. s/p BMS pRCA 2002; b. DES to pRCA & DES p/m RCA 2006; c. PCI/DES OM4 2007; d. PCI/CBA to RCA for ISR 10/2006; e.08/2012 STEMI/Cath/PCI:  LAD 95% >> PCI: Promus Prem DES  //  f. NSTEMI 10/15 >> LHC: mLAD stent ok, dLAD 70, OM1 CTO, OM2 stent ok, dOM2 90, OM3 30-40, dLCx 90, p-mRCA stent ok, mRCA stent ok w/ 60-70 ISR, EF 50% >> med Rx // MV 1/17: no ischemia // Cath ([facility]) 05/2019: RCA 100 (CTO)    Combined systolic and diastolic CHF    a. Myoview 1/17: EF 35%  //  b. Echo 1/17 EF 45-50% // Echo 05/2019: EF 50-55 // Echocardiogram 8/21: EF 50, inf AK, post and mid inf HK, mod LVH, RVSP 25.3, mild to mod LAE, trivial MR    Depression    Diabetes mellitus ([place])    a. A1c 8.8 08/2012->Metformin initiated. => b. A1c (9/14): 6.6   Gastroesophageal reflux disease    History of nuclear stress test    a.  Myoview 1/17: EF 35%, fixed inferior lateral defect consistent with infarct, no ischemia, intermediate risk   History of pneumonia    History of stroke 01/2021   Zio Monitor 11/22: NSR, avg HR 61; PVCs (1.9%); no AFib, no high grade HB, no ventricular arrhythmias   HTN (hypertension)    Hx of NSTEMI    Hyperlipidemia    Ischemic cardiomyopathy    a. EF 40%; improved to normal;  b. 08/2012 Echo: EF 50-55%, mod LVH.//  c. Echo 9/16: Inf HK, mild LVH, EF 55%, mild LAE, normal RVF, mild RAE, PASP 35 mmHg  //  d. Echo 1/17: EF 45-50%, inferior HK, mild BAE, PASP 33 mmHg   Obesity    OSA (obstructive sleep apnea)    Does not use CPAP as of 05/2011   Tobacco abuse     Past Surgical History:  Procedure Laterality Date   CORONARY ANGIOPLASTY WITH STENT PLACEMENT     CORONARY STENT PLACEMENT  2009   LEFT HEART CATHETERIZATION WITH CORONARY ANGIOGRAM N/A 08/31/2012   Procedure: LEFT HEART CATHETERIZATION WITH CORONARY ANGIOGRAM;  Surgeon: Burnell Blanks, MD;  Location: [facility] CATH LAB;  Service: Cardiovascular;  Laterality: N/A;   LEFT HEART CATHETERIZATION WITH CORONARY ANGIOGRAM N/A 11/16/2013   Procedure: LEFT HEART  CATHETERIZATION WITH CORONARY ANGIOGRAM;  Surgeon: Blane Ohara, MD;  Location: [facility] CATH LAB;  Service: Cardiovascular;  Laterality: N/A;   LEFT HEART CATHETERIZATION WITH CORONARY ANGIOGRAM N/A 03/15/2014   Procedure: LEFT HEART CATHETERIZATION WITH CORONARY ANGIOGRAM;  Surgeon: Peter M Martinique, MD;  Location: [facility] CATH LAB;  Service: Cardiovascular;  Laterality: N/A;   PERCUTANEOUS CORONARY STENT INTERVENTION (PCI-S) Right 08/31/2012   Procedure: PERCUTANEOUS CORONARY STENT INTERVENTION (PCI-S);  Surgeon: Burnell Blanks, MD;  Location: [facility] CATH LAB;  Service: Cardiovascular;  Laterality: Right;   PERCUTANEOUS CORONARY STENT INTERVENTION (PCI-S)  11/16/2013   Procedure: PERCUTANEOUS CORONARY STENT INTERVENTION (PCI-S);  Surgeon: Blane Ohara, MD;  Location: [facility] CATH LAB;   Service: Cardiovascular;;    Current Medications: No outpatient medications have been marked as taking for the 06/21/21 encounter (Appointment) with Imogene Burn, PA-C.     Allergies:   Penicillin g and Penicillins   Social History   Socioeconomic History   Marital status: Single    Spouse name: Not on file   Number of children: 0   Years of education: Not on file   Highest education level: Not on file  Occupational History   Occupation: retired  Tobacco Use   Smoking status: Former    Packs/day: 0.50    Years: 30.00    Pack years: 15.00    Types: Cigarettes   Smokeless tobacco: Never   Tobacco comments:    trying to cut down  Vaping Use   Vaping Use: Never used  Substance and Sexual Activity   Alcohol use: No   Drug use: No    Comment: remote marijuana use   Sexual activity: Not Currently  Other Topics Concern   Not on file  Social History Narrative   Lives alone.  He has no children.   He retired early due to health problems.         Social Determinants of Health   Financial Resource Strain: Low Risk    Difficulty of Paying Living Expenses: Not hard at all  Food Insecurity: Food Insecurity Present   Worried About Charity fundraiser in the Last Year: Sometimes true   Arboriculturist in the Last Year: Sometimes true  Transportation Needs: No Transportation Needs   Lack of Transportation (Medical): No   Lack of Transportation (Non-Medical): No  Physical Activity: Inactive   Days of Exercise per Week: 0 days   Minutes of Exercise per Session: 0 min  Stress: No Stress Concern Present   Feeling of Stress : Not at all  Social Connections: Not on file     Family History:  The patient's ***family history includes Cancer in his mother; Coronary artery disease in an other family member; Depression in his mother; Hypertension in his mother; Other in his father.   ROS:   Please see the history of present illness.    ROS All other systems reviewed and are  negative.   PHYSICAL EXAM:   VS:  There were no vitals taken for this visit.  Physical Exam  GEN: Well nourished, well developed, in no acute distress  HEENT: normal  Neck: no JVD, carotid bruits, or masses Cardiac:RRR; no murmurs, rubs, or gallops  Respiratory:  clear to auscultation bilaterally, normal work of breathing GI: soft, nontender, nondistended, + BS Ext: without cyanosis, clubbing, or edema, Good distal pulses bilaterally MS: no deformity or atrophy  Skin: warm and dry, no rash Neuro:  Alert and Oriented x 3, Strength and  sensation are intact Psych: euthymic mood, full affect  Wt Readings from Last 3 Encounters:  03/03/21 (!) 314 lb 9.6 oz (142.7 kg)  02/06/21 (!) 318 lb (144.2 kg)  02/02/21 (!) 318 lb (144.2 kg)      Studies/Labs Reviewed:   EKG:  EKG is*** ordered today.  The ekg ordered today demonstrates ***  Recent Labs: 06/23/2020: TSH 0.86 01/18/2021: NT-Pro BNP 243 02/02/2021: B Natriuretic Peptide 178.9 02/05/2021: ALT 22 02/06/2021: Hemoglobin 16.1; Platelets 265 03/17/2021: BUN 23; Creatinine, Ser 1.57; Potassium 4.0; Sodium 143   Lipid Panel    Component Value Date/Time   CHOL 140 02/06/2021 0323   CHOL 105 05/27/2020 0857   TRIG 84 02/06/2021 0323   HDL 38 (L) 02/06/2021 0323   HDL 48 05/27/2020 0857   CHOLHDL 3.7 02/06/2021 0323   VLDL 17 02/06/2021 0323   LDLCALC 85 02/06/2021 0323   LDLCALC 45 05/27/2020 0857    Additional studies/ records that were reviewed today include:   ZIO 03/2021 Patch Wear Time:  6 days and 11 hours (2022-09-28T07:54:04-399 to 2022-10-04T19:01:57-0400)   Patient had a min HR of 43 bpm, max HR of 171 bpm, and avg HR of 61 bpm. Predominant underlying rhythm was Sinus Rhythm. Bundle Branch Block/IVCD was present. 3 Supraventricular Tachycardia runs occurred, the run with the fastest interval lasting 5 beats  with a max rate of 171 bpm, the longest lasting 5 beats with an avg rate of 95 bpm. True duration of  Supraventricular Tachycardia difficult to ascertain due to artifact. Isolated SVEs were occasional (1.1%, 1215), SVE Couplets were rare (<1.0%, 218),  and SVE Triplets were rare (<1.0%, 8). Isolated VEs were occasional (1.9%, 2097), VE Couplets were occasional (1.2%, 675), and no VE Triplets were present. Difficulty discerning atrial activity making definitive diagnosis difficult to ascertain. Inverted  QRS complexes possibly due to inverted placement of device.   Summary: The basic rhythm is normal sinus with an average heart rate of 61 bpm.  There are occasional PVCs with an overall burden of 1.9%.  There are no prolonged ventricular arrhythmias.  There is no atrial fibrillation or flutter identified.  There is no high-grade AV block noted.ECHOCARDIOGRAM 02/06/21 EF 45-50, global HK, moderate LVH, G1 DD, RVSP 15.1, mild LAE, trivial AI, dilated aortic root (37 mm)   ZIO monitor 02/2020 The basic rhythm is normal sinus with an average HR of 59 bpm with baseline IVCD/bundle branch block No atrial fibrillation or flutter No high-grade heart block or pathologic pauses There are frequent PVC's (6% burden) and rare, nonsustained episodes of idioventricular rhythm. There are rare ventricular runs, longest 6 beats There are occasional supraventricular beats (3.7% burden) and rare supraventricular runs, longest lasting 17 seconds   Echocardiogram 01/14/20 EF 50, inf AK , post and inf HK, mod LVH, normal RVSF, RVSP 25.3, mild to mod LAE, trivial MR,    Cardiac catheterization 05/14/2019 ([facility]) LM normal LAD diff irregs LCx diffuse irregs RCA mid 100 (CTO)   Echocardiogram 05/13/2019 ([facility]) Mod conc LVH, inf AK and likely post HK, EF 50-55   Myoview 06/16/15 Intermediate risk stress nuclear study with a large, severe, predominantly fixed inferior lateral defect consistent with prior infarct; no significant ischemia; EF 35 but visually appears better; akinesis of the basal and mid inferior lateral wall; mild  LVE; suggest echo to better assess LV function. Study intermediate risk due to reduced LV function.    Carotid US (4/13):  Bilateral: no ICA stenosis  Risk Assessment/Calculations:   {Does this patient have ATRIAL FIBRILLATION?:[id number]}     ASSESSMENT:    No diagnosis found.   PLAN:  In order of problems listed above:  1. Coronary artery disease involving native coronary artery of native heart without angina pectoris Status post multiple PCI procedures in the setting of ACS and ST elevation myocardial infarctions.   Cardiac catheterization December 2020 with chronically occluded RCA and no significant obstructive disease in the LCx or LAD.   He has not had any chest discomfort to suggest angina.  Continue aspirin 81 mg daily, atorvastatin 80 mg daily, clopidogrel 75 mg daily, carvedilol 25 mg twice daily, isosorbide mononitrate 90 mg daily.   2. Chronic heart failure with preserved ejection fraction (HCC) EF 45-50 by echocardiogram done last month when admitted for TIA.  Volume status overall appears stable.  He is NYHA IIb.  Continue empagliflozin 10 mg daily, torsemide 20 mg twice daily, spironolactone 12.5 mg daily.   3. PVC's (premature ventricular contractions) Continue carvedilol 25 mg twice daily.   4. SVT (supraventricular tachycardia) (HCC) Continue carvedilol 25 mg twice daily.     5. CKD (chronic kidney disease), stage II Recent creatinine stable.  His blood pressure is uncontrolled.  I will place him on losartan 50 mg daily.  He will need close follow-up on renal function.  I will repeat a BMET again in 2 weeks.   6. Essential hypertension    7. Mixed hyperlipidemia Continue atorvastatin 80 mg daily.   8. History of TIA (transient ischemic attack)         Shared Decision Making/Informed Consent   {Are you ordering a CV Procedure (e.g. stress test, cath, DCCV, TEE, etc)?   Press F2        :[id number]}    Medication Adjustments/Labs and Tests  Ordered: Current medicines are reviewed at length with the patient today.  Concerns regarding medicines are outlined above.  Medication changes, Labs and Tests ordered today are listed in the Patient Instructions below. There are no Patient Instructions on file for this visit.   Sumner Boast, PA-C  06/15/2021 8:32 AM    [facility] [place], [place], [place]  [address] Phone: [phone]; Fax: [phone]

## 2021-06-21 ENCOUNTER — Ambulatory Visit: Payer: Medicare HMO | Admitting: Physician Assistant

## 2021-06-26 ENCOUNTER — Other Ambulatory Visit: Payer: Self-pay

## 2021-06-26 ENCOUNTER — Telehealth: Payer: Self-pay

## 2021-06-26 ENCOUNTER — Ambulatory Visit (INDEPENDENT_AMBULATORY_CARE_PROVIDER_SITE_OTHER): Payer: Commercial Managed Care - HMO | Admitting: Podiatry

## 2021-06-26 ENCOUNTER — Encounter: Payer: Self-pay | Admitting: Podiatry

## 2021-06-26 DIAGNOSIS — L84 Corns and callosities: Secondary | ICD-10-CM

## 2021-06-26 DIAGNOSIS — E1159 Type 2 diabetes mellitus with other circulatory complications: Secondary | ICD-10-CM

## 2021-06-26 DIAGNOSIS — E1142 Type 2 diabetes mellitus with diabetic polyneuropathy: Secondary | ICD-10-CM | POA: Diagnosis not present

## 2021-06-26 DIAGNOSIS — B351 Tinea unguium: Secondary | ICD-10-CM | POA: Diagnosis not present

## 2021-06-26 DIAGNOSIS — N179 Acute kidney failure, unspecified: Secondary | ICD-10-CM

## 2021-06-26 DIAGNOSIS — M79675 Pain in left toe(s): Secondary | ICD-10-CM | POA: Diagnosis not present

## 2021-06-26 DIAGNOSIS — N1831 Chronic kidney disease, stage 3a: Secondary | ICD-10-CM

## 2021-06-26 DIAGNOSIS — M79674 Pain in right toe(s): Secondary | ICD-10-CM | POA: Diagnosis not present

## 2021-06-26 NOTE — Progress Notes (Signed)
This patient returns to my office for at risk foot care.  This patient requires this care by a professional since this patient will be at risk due to having pre-ulcerative calluses, CKD and diabetic neuropathy and coagulation defect.  Patient is taking plavix.  This patient is unable to cut nails himself since the patient cannot reach his nails.These nails are painful walking and wearing shoes.  This patient presents for at risk foot care today.    General Appearance  Alert, conversant and in no acute stress.  Vascular  Dorsalis pedis and posterior tibial  pulses are  weakly  palpable  bilaterally.  Capillary return is within normal limits  bilaterally. Temperature is within normal limits  bilaterally.  Neurologic  Senn-Weinstein monofilament wire test diminished   bilaterally. Muscle power within normal limits bilaterally.  Nails Thick disfigured discolored nails with subungual debris  from hallux to fifth toes bilaterally. No evidence of bacterial infection or drainage bilaterally.  Orthopedic  No limitations of motion  feet .  No crepitus or effusions noted.  No bony pathology or digital deformities noted.  Plantar flexed second metatarsal  B/L.  Skin  normotropic skin  noted bilaterally.  No signs of infections or ulcers noted.   Pre-ulcerous callus sub 2  B/L.  Porokeratosis sub 5th  Left.  Onychomycosis  Pain in right toes  Pain in left toes  Porokeratosis/Pre-ulcerous callus  B/L.  Consent was obtained for treatment procedures.   Mechanical debridement of nails 1-5  bilaterally performed with a nail nipper.  Filed with dremel without incident. Porokeratosis debrided with # 15 blade.    Return office visit     3 months                Told patient to return for periodic foot care and evaluation due to potential at risk complications.   Lakesa Coste DPM  

## 2021-06-26 NOTE — Telephone Encounter (Signed)
° °  Telephone encounter was:  Successful.  06/26/2021 Name: [name] [name] MRN: [id number] DOB: 18-Nov-1955  [name] [name] is a 66 y.o. year old male who is a primary care patient of [name], Hunt Oris, MD . The community resource team was consulted for assistance with Transportation Needs  and [place] guide performed the following interventions:  Pt unable to speak at this time as he is getting ready for his appt today at 4p and is waiting on his ride. Pt and I agreed tomorrow would be better to speak about resources.  Follow Up Plan:  Care guide will follow up with patient by phone over the next day.  [place] management  [place], [place]  Main Phone: [phone]   E-mail: [email or web address]  Website: [email or web address]

## 2021-06-28 ENCOUNTER — Telehealth: Payer: Self-pay

## 2021-06-28 NOTE — Telephone Encounter (Addendum)
° ° °  Telephone encounter was:  Successful.  06/28/2021 Name: Kole Hilyard MRN: [id number] DOB: 06-03-1956  Castle Lamons is a 66 y.o. year old male who is a primary care patient of [name], Hunt Oris, MD . The community resource team was consulted for assistance with Transportation Needs   Care guide performed the following interventions:  Conference call with pt and [facility] Medicare (New plan) to arrange transportation for pt. Ride confirmation: [id number]. Pt has been educated on how to schedule rides and when. He does not have any further questions or concerns at this time.  Follow Up Plan:  No further follow up planned at this time. The patient has been provided with needed resources. Pt will call [phone] to call in all rides. Access [place] application and [employer] will be sent out by mail as well.  [facility] management  [place], [place]  Main Phone: [phone]   E-mail: [email or web address]  Website: [email or web address]

## 2021-06-29 DIAGNOSIS — R69 Illness, unspecified: Secondary | ICD-10-CM | POA: Diagnosis not present

## 2021-06-30 ENCOUNTER — Other Ambulatory Visit: Payer: Self-pay | Admitting: Physician Assistant

## 2021-07-03 ENCOUNTER — Ambulatory Visit: Payer: Medicare HMO | Admitting: Internal Medicine

## 2021-07-03 DIAGNOSIS — R69 Illness, unspecified: Secondary | ICD-10-CM | POA: Diagnosis not present

## 2021-07-04 ENCOUNTER — Ambulatory Visit (INDEPENDENT_AMBULATORY_CARE_PROVIDER_SITE_OTHER): Payer: Medicare Other

## 2021-07-04 ENCOUNTER — Other Ambulatory Visit: Payer: Self-pay | Admitting: Internal Medicine

## 2021-07-04 DIAGNOSIS — E1159 Type 2 diabetes mellitus with other circulatory complications: Secondary | ICD-10-CM

## 2021-07-04 DIAGNOSIS — I5042 Chronic combined systolic (congestive) and diastolic (congestive) heart failure: Secondary | ICD-10-CM

## 2021-07-04 DIAGNOSIS — N1831 Chronic kidney disease, stage 3a: Secondary | ICD-10-CM

## 2021-07-04 MED ORDER — BLOOD GLUCOSE METER KIT: PACK | 0 refills | Status: DC

## 2021-07-04 NOTE — Patient Instructions (Signed)
Visit Information  Following are the goals we discussed today:   Track and Manage My Blood Sugars - DM   Timeframe:  Long-Range Goal Priority:  High Start Date: 05/08/2021                            Expected End Date:  05/08/2022                     Follow Up Date 09/05/2021   - check blood sugar at prescribed times - check blood sugar if I feel it is too high or too low - enter blood sugar readings and medication or insulin into daily log - take the blood sugar log to all doctor visits - take the blood sugar meter to all doctor visits    Why is this important?   Checking your blood sugar at home helps to keep it from getting very high or very low.  Writing the results in a diary or log helps the doctor know how to care for you.  Your blood sugar log should have the time, date and the results.  Also, write down the amount of insulin or other medicine that you take.  Other information, like what you ate, exercise done and how you were feeling, will also be helpful.    Track and Manage My Blood Pressures and Heart Rate   Timeframe:  Long-Range Goal Priority:  High Start Date:   05/08/2021                          Expected End Date:  05/08/2022                     Follow Up Date 09/05/2021   - check blood pressure daily - choose a place to take my blood pressure (home, clinic or office, retail store) - write blood pressure results in a log or diary    Why is this important?   You won't feel high blood pressure, but it can still hurt your blood vessels.  High blood pressure can cause heart or kidney problems. It can also cause a stroke.  Making lifestyle changes like losing a little weight or eating less salt will help.  Checking your blood pressure at home and at different times of the day can help to control blood pressure.  If the doctor prescribes medicine remember to take it the way the doctor ordered.  Call the office if you cannot afford the medicine or if there are  questions about it.    Plan: Telephone follow up appointment with care management team member scheduled for:  2 months  The patient has been provided with contact information for the care management team and has been advised to call with any health related questions or concerns.   Tomasa Blase, PharmD Clinical Pharmacist, [facility]   Please call the care guide team at [phone] if you need to cancel or reschedule your appointment.   Patient verbalizes understanding of instructions and care plan provided today and agrees to view in [place]. Active MyChart status confirmed with patient.

## 2021-07-04 NOTE — Progress Notes (Signed)
Chronic Care Management Pharmacy Note  07/04/2021 Name:  [name] [name] MRN:  [id number] DOB:  1955/08/30  Summary: -Patient reports that he has not received BG meter - unable to check BG - has not yet started glipizide as he is unsure as to where his BG is running  -Has not yet purchased BP cuff / no longer has a scale - plans to purchase next month    Recommendations/Changes made from today's visit: -Recommended for patient to start monitoring BP, BG, and weights daily, reviewed with patient BP, BG goals, along with monitoring parameters regarding weight -Patient has recently switched insurances to [employer] - now is provided transportation this is appointments, reviewed time and date of upcoming appointments with patient   Subjective: [name] [name] is an 66 y.o. year old male who is a primary patient of [name], Hunt Oris, MD.  The CCM team was consulted for assistance with disease management and care coordination needs.    Engaged with patient by telephone for follow up visit in response to provider referral for pharmacy case management and/or care coordination services.   Consent to Services:  The patient was given the following information about Chronic Care Management services today, agreed to services, and gave verbal consent: 1. CCM service includes personalized support from designated clinical staff supervised by the primary care provider, including individualized plan of care and coordination with other care providers 2. 24/7 contact phone numbers for assistance for urgent and routine care needs. 3. Service will only be billed when office clinical staff spend 20 minutes or more in a month to coordinate care. 4. Only one practitioner may furnish and bill the service in a calendar month. 5.The patient may stop CCM services at any time (effective at the end of the month) by phone call to the office staff. 6. The patient will be responsible for cost sharing (co-pay) of up to 20% of  the service fee (after annual deductible is met). Patient agreed to services and consent obtained.  Patient Care Team: Biagio Borg, MD as PCP - General (Internal Medicine) Sherren Mocha, MD as PCP - Cardiology (Cardiology) Sherren Mocha, MD as Consulting Physician (Cardiology) Sharmon Revere as Physician Assistant (Cardiology) Sueanne Margarita, MD as Consulting Physician (Sleep Medicine) Knox Royalty, RN as Case Manager Amanie Mcculley, Darnelle Maffucci, [facility] as Pharmacist (Pharmacist)  Recent office visits:  None since last visit    Recent consult visits:  06/26/2021 - Dr. [name] - Podiatry - follow up in 3 months    Hospital visits:  None since last visit   Objective:  Lab Results  Component Value Date   CREATININE 1.57 (H) 03/17/2021   BUN 23 03/17/2021   GFR 46.86 (L) 12/26/2020   GFRNONAA 56 (L) 02/06/2021   GFRAA 57 (L) 05/27/2020   NA 143 03/17/2021   K 4.0 03/17/2021   CALCIUM 9.9 03/17/2021   CO2 27 03/17/2021   GLUCOSE 132 (H) 03/17/2021    Lab Results  Component Value Date/Time   HGBA1C 10.6 (H) 02/06/2021 03:23 AM   HGBA1C 12.1 Repeated and verified X2. (H) 12/26/2020 08:55 AM   GFR 46.86 (L) 12/26/2020 08:55 AM   GFR 40.69 (L) 06/23/2020 08:49 AM   MICROALBUR 64.1 (H) 06/23/2020 08:49 AM   MICROALBUR 49.0 (H) 05/14/2017 08:18 AM    Last diabetic Eye exam:  No results found for: HMDIABEYEEXA  Last diabetic Foot exam:  No results found for: HMDIABFOOTEX   Lab Results  Component Value Date  CHOL 140 02/06/2021   HDL 38 (L) 02/06/2021   LDLCALC 85 02/06/2021   TRIG 84 02/06/2021   CHOLHDL 3.7 02/06/2021    Hepatic Function Latest Ref Rng & Units 02/05/2021 02/02/2021 12/26/2020  Total Protein 6.5 - 8.1 g/dL 7.6 7.0 7.4  Albumin 3.5 - 5.0 g/dL 3.9 3.7 4.0  AST 15 - 41 U/L _0 ALT 0 - 44 U/L _1 Alk Phosphatase 38 - 126 U/L 59 61 88  Total Bilirubin 0.3 - 1.2 mg/dL 0.9 0.8 0.6  Bilirubin, Direct 0.0 - 0.3 mg/dL - - 0.1    Lab Results   Component Value Date/Time   TSH 0.86 06/23/2020 08:49 AM   TSH 1.44 11/20/2019 12:38 PM    CBC Latest Ref Rng & Units 02/06/2021 02/05/2021 02/05/2021  WBC 4.0 - 10.5 K/uL 6.2 - 7.1  Hemoglobin 13.0 - 17.0 g/dL 16.1 18.0(H) 16.7  Hematocrit 39.0 - 52.0 % 50.3 53.0(H) 52.1(H)  Platelets 150 - 400 K/uL 265 - 244    Lab Results  Component Value Date/Time   VD25OH 41.96 06/23/2020 08:49 AM   VD25OH 8.37 (L) 11/20/2019 12:38 PM    Clinical ASCVD: Yes  The ASCVD Risk score (Arnett DK, et al., 2019) failed to calculate for the following reasons:   The patient has a prior MI or stroke diagnosis    Depression screen [facility] 2/9 05/29/2021 01/25/2021 12/26/2020  Decreased Interest 0 0 0  Down, Depressed, Hopeless 0 1 0  PHQ - 2 Score 0 1 0  Altered sleeping - - 1  Tired, decreased energy - - 1  Change in appetite - - 0  Feeling bad or failure about yourself  - - 0  Trouble concentrating - - 0  Moving slowly or fidgety/restless - - 0  Suicidal thoughts - - 0  PHQ-9 Score - - 2  Difficult doing work/chores - - Not difficult at all  Some recent data might be hidden    Social History   Tobacco Use  Smoking Status Former   Packs/day: 0.50   Years: 30.00   Pack years: 15.00   Types: Cigarettes  Smokeless Tobacco Never  Tobacco Comments   trying to cut down   BP Readings from Last 3 Encounters:  03/03/21 (!) 180/100  02/06/21 122/69  02/02/21 (!) 132/101   Pulse Readings from Last 3 Encounters:  03/03/21 (!) 55  02/06/21 (!) 58  02/02/21 (!) 113   Wt Readings from Last 3 Encounters:  03/03/21 (!) 314 lb 9.6 oz (142.7 kg)  02/06/21 (!) 318 lb (144.2 kg)  02/02/21 (!) 318 lb (144.2 kg)   BMI Readings from Last 3 Encounters:  03/03/21 41.51 kg/m  02/06/21 41.96 kg/m  02/02/21 41.96 kg/m    Assessment/Interventions: Review of patient past medical history, allergies, medications, health status, including review of consultants reports, laboratory and other test data, was  performed as part of comprehensive evaluation and provision of chronic care management services.   SDOH:  (Social Determinants of Health) assessments and interventions performed: Yes  SDOH Screenings   Alcohol Screen: Low Risk    Last Alcohol Screening Score (AUDIT): 0  Depression (PHQ2-9): Low Risk    PHQ-2 Score: 0  Financial Resource Strain: Low Risk    Difficulty of Paying Living Expenses: Not hard at all  Food Insecurity: Food Insecurity Present   Worried About Charity fundraiser in the Last Year: Sometimes true   Ran Out of Food in the Last  Year: Sometimes true  Housing: Low Risk    Last Housing Risk Score: 0  Physical Activity: Inactive   Days of Exercise per Week: 0 days   Minutes of Exercise per Session: 0 min  Social Connections: Not on file  Stress: No Stress Concern Present   Feeling of Stress : Not at all  Tobacco Use: Medium Risk   Smoking Tobacco Use: Former   Smokeless Tobacco Use: Never   Passive Exposure: Not on file  Transportation Needs: No Transportation Needs   Lack of Transportation (Medical): No   Lack of Transportation (Non-Medical): No    CCM Care Plan  Allergies  Allergen Reactions   Penicillin G Other (See Comments)    Did it involve swelling of the face/tongue/throat, SOB, or low BP?  N/A Did it involve sudden or severe rash/hives, skin peeling, or any reaction on the inside of your mouth or nose? N/A Did you need to seek medical attention at a hospital or doctor's office? N/A When did it last happen? Child If all above answers are NO, may proceed with cephalosporin use.   Penicillins Other (See Comments)    Did it involve swelling of the face/tongue/throat, SOB, or low BP? N/A Did it involve sudden or severe rash/hives, skin peeling, or any reaction on the inside of your mouth or nose?N/A Did you need to seek medical attention at a hospital or doctor's office? N/A When did it last happen? Child      If all above answers are NO, may  proceed with cephalosporin use.    Medications Reviewed Today     Reviewed by Tomasa Blase, [facility] (Pharmacist) on 07/04/21 at 1314  Med List Status: <None>   Medication Order Taking? Sig Documenting Provider Last Dose Status Informant  Alcohol Swabs (B-D SINGLE USE SWABS BUTTERFLY) PADS [id number]  Use up to 4 times a day to test blood sugars. Dx: E10.9, E11.9 Biagio Borg, MD  Active Self  aspirin 81 MG EC tablet [id number]  Take 81 mg by mouth daily. [provider]  Active   aspirin 81 MG tablet [id number]  Take 1 tablet (81 mg total) by mouth daily. Regalado, Belkys A, MD  Active   atorvastatin (LIPITOR) 80 MG tablet [id number]  Take 1 tablet (80 mg total) by mouth at bedtime. Regalado, Belkys A, MD  Active   Blood Glucose Calibration (TRUE METRIX LEVEL 1) Low SOLN [id number]  Use as needed. Dx: E10.9, E11.9 Biagio Borg, MD  Active Self  blood glucose meter kit and supplies [id number]  Dispense based on patient and insurance preference. Use up to four times daily as directed. (FOR ICD-10 E10.9, E11.9). Biagio Borg, MD  Active Self           Med Note Olena Heckle, [facility]   Fri Mar 03, 2021 11:20 AM)    carvedilol (COREG) 25 MG tablet [id number]  TAKE 1 TABLET(25 MG) BY MOUTH TWICE DAILY WITH A MEAL Sherren Mocha, MD  Active   cetirizine (ZYRTEC) 10 MG tablet [id number] No TAKE 1 TABLET BY MOUTH DAILY  Patient not taking: Reported on 05/08/2021   Biagio Borg, MD Not Taking Active   clopidogrel (PLAVIX) 75 MG tablet [id number]  Take 1 tablet (75 mg total) by mouth daily. Regalado, Belkys A, MD  Active   empagliflozin (JARDIANCE) 10 MG TABS tablet [id number]  Take 1 tablet (10 mg total) by mouth daily before breakfast. [name] T, PA-C  Active Self  gabapentin (NEURONTIN) 300 MG  capsule [id number]  TAKE 1 CAPSULE(300 MG) BY MOUTH THREE TIMES DAILY  Patient taking differently: 300 mg 2 (two) times daily.   Biagio Borg, MD  Active   glipiZIDE (GLUCOTROL) 5 MG tablet [id number] No  Take 1 tablet (5 mg total) by mouth 2 (two) times daily.  Patient not taking: Reported on 07/04/2021   Niel Hummer A, MD Not Taking Active   glucose blood (TRUE METRIX BLOOD GLUCOSE TEST) test strip [id number]  Use to test blood sugars up to 4 times a day. DX: E10.9, E.11.9 Biagio Borg, MD  Active   hydrocortisone cream 1 % [id number]  Apply to affected area 2 times daily Sherwood Gambler, MD  Active Self  isosorbide mononitrate (IMDUR) 30 MG 24 hr tablet [id number]  TAKE 3 TABLETS(90 MG) BY MOUTH DAILY Biagio Borg, MD  Active   Lancets 33G MISC [id number]  Use to test blood sugars up to 4 times a day. DX: E10.9, E11.9 Biagio Borg, MD  Active   losartan (COZAAR) 50 MG tablet [id number]  Take 1 tablet (50 mg total) by mouth daily. [name] T, PA-C  Active   nitroGLYCERIN (NITROSTAT) 0.4 MG SL tablet [id number]  PLACE 1 TABLET UNDER THE TONGUE EVERY 5 MINUTES AS NEEDED FOR CHEST PAIN Biagio Borg, MD  Active   pantoprazole (PROTONIX) 40 MG tablet [id number]  Take 1 tablet (40 mg total) by mouth daily. Biagio Borg, MD  Active Self  PARoxetine (PAXIL) 20 MG tablet [id number]  Take 1 tablet (20 mg total) by mouth daily. Biagio Borg, MD  Active Self           Med Note Olena Heckle, [facility]   Ludwig Clarks Mar 03, 2021 11:21 AM)    potassium chloride (KLOR-CON) 10 MEQ tablet [id number]  TAKE 2 TABLETS(20 MEQ) BY MOUTH TWICE DAILY Liliane Shi, PA-C  Active   rosuvastatin (CRESTOR) 40 MG tablet [id number]   [provider]  Active   spironolactone (ALDACTONE) 25 MG tablet [id number]  TAKE 1/2 TABLET(12.5 MG) BY MOUTH DAILY Sherren Mocha, MD  Active   torsemide (DEMADEX) 20 MG tablet [id number]  Take 1 tablet (20 mg total) by mouth 2 (two) times daily. [name] T, PA-C  Active Self  traMADol (ULTRAM) 50 MG tablet [id number]  TAKE 1 TABLET(50 MG) BY MOUTH EVERY 6 HOURS AS NEEDED  Patient taking differently: 50 mg. TAKE 1 TABLET(50 MG) BY MOUTH EVERY 6 HOURS AS NEEDED - uses up to 2 tablets daily    Biagio Borg, MD  Active   vitamin B-12 (CYANOCOBALAMIN) 1000 MCG tablet [id number]  Take 1 tablet (1,000 mcg total) by mouth daily. Biagio Borg, MD  Active Self  Med List Note Eartha Inch, [place] 12/15/15 1042): CPAP            Patient Active Problem List   Diagnosis Date Noted   TIA (transient ischemic attack) 02/05/2021   History of stroke 01/2021   Aortic atherosclerosis ([place]) 12/26/2020   Cough 12/26/2020   Vitamin D deficiency 04/03/2020   B12 deficiency 04/03/2020   Itching 11/22/2019   Chronic heart failure with preserved ejection fraction (HFpEF) ([place]) 05/13/2019   Chronic coronary artery disease 05/13/2019   Elevated troponin 05/13/2019   Morbid obesity with BMI of 40.0-44.9, adult ([place]) 05/13/2019   Pre-ulcerative calluses 02/03/2019   Foot injury, left, initial encounter 11/26/2018   Chest pain 05/30/2017   Hypokalemia 05/30/2017   Allergic rhinitis 12/18/2016   Lumbar radiculitis  09/05/2016   Lumbar spinal stenosis 09/05/2016   CKD (chronic kidney disease), stage III ([place]) 12/29/2015   Chest pain syndrome 12/15/2015   Acute kidney injury ([place]) 12/15/2015   Atypical chest pain 12/15/2015   Encounter for well adult exam with abnormal findings 06/27/2015   Chronic low back pain 06/27/2015   Left lumbar radiculopathy 04/13/2015   Bilateral plantar fasciitis 04/13/2015   Essential hypertension 03/09/2015   OSA (obstructive sleep apnea) 03/27/2014   Peripheral neuropathy ([place]) 12/29/2013   External hemorrhoid, thrombosed 12/03/2013   Sinus bradycardia 11/15/2013   NSTEMI (non-ST elevated myocardial infarction) ([place]) 11/14/2013   Chronic combined systolic and diastolic CHF  00/17/4944   CAD S/P multiple PCI    Hyperlipidemia    DM2 (diabetes mellitus, type 2) ([place]) 06/26/2013   Gait disorder 06/26/2013   Obesity, Class III, BMI 40-49.9 (morbid obesity) ([place]) 09/10/2012   Tobacco abuse 09/02/2012   Ischemic cardiomyopathy- EF 50-55% June 2015    ST  elevation myocardial infarction (STEMI) of anterior wall, initial episode of care ([place]) 09/01/2012   HEMORRHOIDS 12/26/2009   Acute prostatitis 12/26/2009   GANGLION CYST, WRIST, RIGHT 11/28/2009   COLONIC POLYPS 10/07/2009   Anxiety with depression 07/21/2009   DYSPNEA 03/24/2009   GASTROESOPHAGEAL REFLUX DISEASE 01/27/2007   PERCUTANEOUS TRANSLUMINAL CORONARY ANGIOPLASTY, HX OF 02/09/2006    Immunization History  Administered Date(s) Administered   Influenza Whole 04/12/2008, 04/12/2010, 02/10/2011   Influenza, Seasonal, Injecte, Preservative Fre 03/11/2013   Influenza,inj,Quad PF,6+ Mos 02/12/2015, 05/04/2016, 03/29/2017, 05/05/2018, 05/15/2019, 03/22/2020   Moderna Sars-Covid-2 Vaccination 11/10/2019, 12/07/2019, 05/16/2020   PFIZER(Purple Top)SARS-COV-2 Vaccination 11/10/2019   Pneumococcal Conjugate-13 06/26/2013   Pneumococcal Polysaccharide-23 07/21/2009, 06/27/2015   Td 03/24/2009   Zoster Recombinat (Shingrix) 04/02/2017    Conditions to be addressed/monitored:  Hypertension, Hyperlipidemia, Diabetes, Heart Failure, Coronary Artery Disease, Chronic Kidney Disease, Depression, and Chronic Pain  Care Plan : CCM Pharmacy Care Plan  Updates made by Tomasa Blase, RPH since 07/04/2021 12:00 AM     Problem: Hypertension, Hyperlipidemia, Diabetes, Heart Failure, Coronary Artery Disease, Chronic Kidney Disease, Depression, and Chronic Pain   Priority: High  Onset Date: 05/08/2021     Long-Range Goal: Disease Management   Start Date: 05/08/2021  Expected End Date: 05/08/2022  This Visit's Progress: Not on track  Recent Progress: On track  Priority: High  Note:   Current Barriers:  Unable to independently monitor therapeutic efficacy Does not adhere to prescribed medication regimen  Pharmacist Clinical Goal(s):  Patient will achieve adherence to monitoring guidelines and medication adherence to achieve therapeutic efficacy achieve improvement in blood pressure and  blood sugar control as evidenced by BP / BG logs through collaboration with PharmD and provider.   Interventions: 1:1 collaboration with Biagio Borg, MD regarding development and update of comprehensive plan of care as evidenced by provider attestation and co-signature Inter-disciplinary care team collaboration (see longitudinal plan of care) Comprehensive medication review performed; medication list updated in electronic medical record  Hyperlipidemia/Coronary Artery Disease / previous CVA: (LDL goal < 70) -Unknown - has not been rechecked since starting atorvastatin   Lab Results  Component Value Date   [place] 85 02/06/2021  -Current treatment: Atorvastatin 28m - 1 tablet daily  Clopidogrel 739m- 1 tablet daily  ASA 8145m 1 tablet daily  Nitroglycerin 0.4mg36m1 tablet every 5 minutes as needed for chest pain -Medications previously tried: pravastatin, rosuvastatin   -Current dietary patterns: notes that he avoids salting foods, but does admit to eating  salty foods at times  -Current exercise habits: nothing at this time  -Educated on Cholesterol goals;  Benefits of statin for ASCVD risk reduction; Importance of limiting foods high in cholesterol; Exercise goal of 150 minutes per week; -Counseled on diet and exercise extensively Recommended to continue current medication  Diabetes (A1c goal <7%) -Uncontrolled Lab Results  Component Value Date   HGBA1C 10.6 (H) 02/06/2021  -Current medications: Glipizide 5m - 1 tablet twice daily - not taking at this time  Jardiance 116m 1 tablet daily  -Medications previously tried: glipizide XL, glimepiride, metformin, metformin XR  -Current home glucose readings fasting glucose: has not been able to check recently because he does not have a meter  -Reports hypoglycemic/hyperglycemic symptoms -Current meal patterns:  breakfast: egg, sausage - does not typically   lunch: small sandwich if he eats anything at all  dinner: hamburger,  fries  snacks: chips drinks: Pepsi -  typically drinking 2 x 16oz daily  -Current exercise: nothing at this time  -Educated on A1c and blood sugar goals; Complications of diabetes including kidney damage, retinal damage, and cardiovascular disease; Exercise goal of 150 minutes per week; Prevention and management of hypoglycemic episodes; Benefits of routine self-monitoring of blood sugar; Carbohydrate counting and/or plate method -Counseled to check feet daily and get yearly eye exams -Counseled on diet and exercise extensively Recommended to continue current medication -Will reach out to PCP for new BG meter to be sent to pharmacy - patient to follow up with PCP in 1 week - patient not agreeable to take glipizide until current level of BG control is known   Heart Failure (Goal: manage symptoms and prevent exacerbations)  / Hypertension (BP goal <140/90) -Controlled -Last ejection fraction: 45-50% (Date: 02/06/2021) -HF type: Diastolic -NYHA Class: II (slight limitation of activity) -Current treatment: Carvedilol 2580m 1 tablet twice daily  Losartan 40m35m1 tablet daily  Torsemide 20mg6m tablet twice daily  Potassium Chloride 10mEq67m tablets twice daily  Jardiance 10mg -58mablet daily  Isosorbide mononitrate 30mg -351mlets daily  Spironolactone 25mg - 119mablet daily  -Medications previously tried: amlodipine, benazepril, clonidine, furosemide,   -Current home BP/HR readings: unknown at this time  -Current dietary habits: reports that he does not add salt to his foods but will each foods that can be high in sodium at times  -Current exercise habits: nothing at this time  -Educated on Benefits of medications for managing symptoms and prolonging life Importance of weighing daily; if you gain more than 3 pounds in one day or 5 pounds in one week, reach out to PCP/ Cardiology office Proper diuretic administration and potassium supplementation Importance of blood pressure  control -Counseled on diet and exercise extensively Recommended to continue current medication  Chronic Kidney Disease (Goal: Prevention of disease progression) -stable  -Last eGFR: 49 mL/min  -Current treatment  Avoidance of nephrotoxic agents, gain control of BP / BG to prevent further damage  -Medications previously tried: n/a  -Recommended to continue current medication   Depression (Goal: Promotion / maintenance of positive moode) -Controlled -Current treatment: Paroxetine 20mg - 1 33met daily  -Medications previously tried/failed: bupropion, citalopram, escitalopram -Educated on Benefits of medication for symptom control Benefits of cognitive-behavioral therapy with or without medication -Recommended to continue current medication  Chronic Pain / Peripheral neuropath / Lumbar radiculitis (Goal: Pain Management ) -Controlled -Current treatment  Tramadol 40mg - 1 t29mt every 6 hours as needed  Gabapentin 300mg - 1 ca19me  2 times daily  -Medications previously tried: APAP, norco, IBU, oxycodone  -Recommended to continue current medication  Health Maintenance -Vaccine gaps: Shingles, COVID booster, Influenza, Tdap -Current therapy:  Pantoprazole 9m - 1 tablet daily  Hydrocortisone 1% cream - applied twice daily  Vitamin B12 1009m -1 tablet daily  Cetirizine 1049m 1 tablet daily  -Educated on Cost vs benefit of each product must be carefully weighed by individual consumer -Patient is satisfied with current therapy and denies issues -Recommended for patient to complete COVID booster, Tdap, and influenza vaccine with next trip to pharmacy  - Shingles to be covered by insurance as of 2023  Patient Goals/Self-Care Activities Patient will:  - take medications as prescribed as evidenced by patient report and record review check glucose daily, document, and provide at future appointments check blood pressure daily, document, and provide at future appointments weigh  daily, and contact provider if weight gain of >3lbs in a day, or 5lbs in a week target a minimum of 150 minutes of moderate intensity exercise weekly engage in dietary modifications by reducing sodium intake / moderation of carb intake  Follow Up Plan: Telephone follow up appointment with care management team member scheduled for: 2 months The patient has been provided with contact information for the care management team and has been advised to call with any health related questions or concerns.          Medication Assistance: None required.  Patient affirms current coverage meets needs.  Care Gaps: COVID booster, Influenza vaccine, Tdap, Shingles vaccine   [id number] [place] [id number]: [phone]: [phone] [place]one: [phone]: [phone] pill box? No - has pill boxes, has not bee using recently, is agreeable to restart  Pt endorses 70-75% compliance  Care Plan and Follow Up Patient Decision:  Patient agrees to Care Plan and Follow-up.  Plan: Telephone follow up appointment with care management team member scheduled for:  2 months The patient has been provided with contact information for the care management team and has been advised to call with any health related questions or concerns.   DanTomasa BlaseharmD Clinical Pharmacist, [place]

## 2021-07-04 NOTE — Progress Notes (Addendum)
Already done sept 22 and several times before that if I recall will do one more time but that will be it for this calendar year

## 2021-07-11 ENCOUNTER — Other Ambulatory Visit: Payer: Self-pay | Admitting: Internal Medicine

## 2021-07-11 DIAGNOSIS — E785 Hyperlipidemia, unspecified: Secondary | ICD-10-CM

## 2021-07-11 DIAGNOSIS — E1122 Type 2 diabetes mellitus with diabetic chronic kidney disease: Secondary | ICD-10-CM | POA: Diagnosis not present

## 2021-07-11 DIAGNOSIS — N1831 Chronic kidney disease, stage 3a: Secondary | ICD-10-CM

## 2021-07-11 DIAGNOSIS — E1140 Type 2 diabetes mellitus with diabetic neuropathy, unspecified: Secondary | ICD-10-CM

## 2021-07-11 DIAGNOSIS — Z7984 Long term (current) use of oral hypoglycemic drugs: Secondary | ICD-10-CM | POA: Diagnosis not present

## 2021-07-11 DIAGNOSIS — I2510 Atherosclerotic heart disease of native coronary artery without angina pectoris: Secondary | ICD-10-CM

## 2021-07-11 DIAGNOSIS — I5030 Unspecified diastolic (congestive) heart failure: Secondary | ICD-10-CM | POA: Diagnosis not present

## 2021-07-11 DIAGNOSIS — E1159 Type 2 diabetes mellitus with other circulatory complications: Secondary | ICD-10-CM

## 2021-07-11 DIAGNOSIS — I130 Hypertensive heart and chronic kidney disease with heart failure and stage 1 through stage 4 chronic kidney disease, or unspecified chronic kidney disease: Secondary | ICD-10-CM | POA: Diagnosis not present

## 2021-07-12 ENCOUNTER — Ambulatory Visit (INDEPENDENT_AMBULATORY_CARE_PROVIDER_SITE_OTHER): Payer: Medicare Other | Admitting: Internal Medicine

## 2021-07-12 ENCOUNTER — Encounter: Payer: Self-pay | Admitting: Internal Medicine

## 2021-07-12 ENCOUNTER — Other Ambulatory Visit: Payer: Self-pay

## 2021-07-12 ENCOUNTER — Other Ambulatory Visit: Payer: Self-pay | Admitting: Internal Medicine

## 2021-07-12 VITALS — BP 142/88 | HR 52 | Temp 98.3°F | Ht 73.0 in | Wt 326.0 lb

## 2021-07-12 DIAGNOSIS — I10 Essential (primary) hypertension: Secondary | ICD-10-CM | POA: Diagnosis not present

## 2021-07-12 DIAGNOSIS — Z0001 Encounter for general adult medical examination with abnormal findings: Secondary | ICD-10-CM | POA: Diagnosis not present

## 2021-07-12 DIAGNOSIS — E7849 Other hyperlipidemia: Secondary | ICD-10-CM | POA: Diagnosis not present

## 2021-07-12 DIAGNOSIS — Z23 Encounter for immunization: Secondary | ICD-10-CM | POA: Diagnosis not present

## 2021-07-12 DIAGNOSIS — E559 Vitamin D deficiency, unspecified: Secondary | ICD-10-CM

## 2021-07-12 DIAGNOSIS — E1159 Type 2 diabetes mellitus with other circulatory complications: Secondary | ICD-10-CM | POA: Diagnosis not present

## 2021-07-12 DIAGNOSIS — N1831 Chronic kidney disease, stage 3a: Secondary | ICD-10-CM | POA: Diagnosis not present

## 2021-07-12 DIAGNOSIS — Z87891 Personal history of nicotine dependence: Secondary | ICD-10-CM

## 2021-07-12 DIAGNOSIS — E538 Deficiency of other specified B group vitamins: Secondary | ICD-10-CM

## 2021-07-12 LAB — CBC WITH DIFFERENTIAL/PLATELET
Basophils Absolute: 0 10*3/uL (ref 0.0–0.1)
Basophils Relative: 0.4 % (ref 0.0–3.0)
Eosinophils Absolute: 0.3 10*3/uL (ref 0.0–0.7)
Eosinophils Relative: 3.7 % (ref 0.0–5.0)
HCT: 47.5 % (ref 39.0–52.0)
Hemoglobin: 15.2 g/dL (ref 13.0–17.0)
Lymphocytes Relative: 48.3 % — ABNORMAL HIGH (ref 12.0–46.0)
Lymphs Abs: 3.6 10*3/uL (ref 0.7–4.0)
MCHC: 32 g/dL (ref 30.0–36.0)
MCV: 88.4 fl (ref 78.0–100.0)
Monocytes Absolute: 0.8 10*3/uL (ref 0.1–1.0)
Monocytes Relative: 11 % (ref 3.0–12.0)
Neutro Abs: 2.7 10*3/uL (ref 1.4–7.7)
Neutrophils Relative %: 36.6 % — ABNORMAL LOW (ref 43.0–77.0)
Platelets: 190 10*3/uL (ref 150.0–400.0)
RBC: 5.37 Mil/uL (ref 4.22–5.81)
RDW: 16.2 % — ABNORMAL HIGH (ref 11.5–15.5)
WBC: 7.5 10*3/uL (ref 4.0–10.5)

## 2021-07-12 LAB — BASIC METABOLIC PANEL
BUN: 27 mg/dL — ABNORMAL HIGH (ref 6–23)
CO2: 32 mEq/L (ref 19–32)
Calcium: 9.7 mg/dL (ref 8.4–10.5)
Chloride: 106 mEq/L (ref 96–112)
Creatinine, Ser: 1.81 mg/dL — ABNORMAL HIGH (ref 0.40–1.50)
GFR: 39.05 mL/min — ABNORMAL LOW (ref >60.00)
Glucose, Bld: 90 mg/dL (ref 70–99)
Potassium: 4.5 mEq/L (ref 3.5–5.1)
Sodium: 145 mEq/L (ref 135–145)

## 2021-07-12 LAB — HEMOGLOBIN A1C: Hgb A1c MFr Bld: 7.8 % — ABNORMAL HIGH (ref 4.6–6.5)

## 2021-07-12 LAB — HEPATIC FUNCTION PANEL
ALT: 13 U/L (ref 0–53)
AST: 17 U/L (ref 0–37)
Albumin: 4.3 g/dL (ref 3.5–5.2)
Alkaline Phosphatase: 92 U/L (ref 39–117)
Bilirubin, Direct: 0.1 mg/dL (ref 0.0–0.3)
Total Bilirubin: 0.5 mg/dL (ref 0.2–1.2)
Total Protein: 7.4 g/dL (ref 6.0–8.3)

## 2021-07-12 LAB — TSH: TSH: 1.83 u[IU]/mL (ref 0.35–5.50)

## 2021-07-12 LAB — PSA: PSA: 1 ng/mL (ref 0.10–4.00)

## 2021-07-12 LAB — VITAMIN B12: Vitamin B-12: 333 pg/mL (ref 211–911)

## 2021-07-12 LAB — LIPID PANEL
Cholesterol: 186 mg/dL (ref 0–200)
HDL: 47.4 mg/dL (ref >39.00)
LDL Cholesterol: 118 mg/dL — ABNORMAL HIGH (ref 0–99)
NonHDL: 138.92
Total CHOL/HDL Ratio: 4
Triglycerides: 105 mg/dL (ref 0.0–149.0)
VLDL: 21 mg/dL (ref 0.0–40.0)

## 2021-07-12 LAB — VITAMIN D 25 HYDROXY (VIT D DEFICIENCY, FRACTURES): VITD: 33.85 ng/mL (ref 30.00–100.00)

## 2021-07-12 MED ORDER — EMPAGLIFLOZIN 25 MG PO TABS: 25.0000 mg | ORAL_TABLET | Freq: Every day | ORAL | 3 refills | Status: DC

## 2021-07-12 MED ORDER — LOSARTAN POTASSIUM 100 MG PO TABS: 100.0000 mg | ORAL_TABLET | Freq: Every day | ORAL | 3 refills | Status: DC

## 2021-07-12 MED ORDER — OZEMPIC (0.25 OR 0.5 MG/DOSE) 2 MG/1.5ML ~~LOC~~ SOPN: 0.5000 mg | PEN_INJECTOR | SUBCUTANEOUS | 3 refills | Status: DC

## 2021-07-12 NOTE — Progress Notes (Signed)
Patient ID: [name] [name], male   DOB: 04-20-1956, 66 y.o.   MRN: [id number]         Chief Complaint:: wellness exam and htn, dm, obesity, hld, ckd       HPI:  [name] [name] is a 66 y.o. male here for wellness exam; due for optho exam, declines covid booster, shingrix, tdap, o/w up to date                        Also Pt denies chest pain, increased sob or doe, wheezing, orthopnea, PND, increased LE swelling, palpitations, dizziness or syncope.   Pt denies polydipsia, polyuria, or new focal neuro s/s.   Pt denies fever, wt loss, night sweats, loss of appetite, or other constitutional symptoms  Depression much improved with med tx as of last visit.  Denies worsening depressive symptoms, suicidal ideation, or panic.  Tolerating statin well.  Hard to lost wt, infact has gained several lbs.  Wt Readings from Last 3 Encounters:  07/12/21 (!) 326 lb (147.9 kg)  03/03/21 (!) 314 lb 9.6 oz (142.7 kg)  02/06/21 (!) 318 lb (144.2 kg)   BP Readings from Last 3 Encounters:  07/12/21 (!) 142/88  03/03/21 (!) 180/100  02/06/21 122/69   Immunization History  Administered Date(s) Administered   Influenza Whole 04/12/2008, 04/12/2010, 02/10/2011   Influenza, Seasonal, Injecte, Preservative Fre 03/11/2013   Influenza,inj,Quad PF,6+ Mos 02/12/2015, 05/04/2016, 03/29/2017, 05/05/2018, 05/15/2019, 03/22/2020, 07/12/2021   Moderna Sars-Covid-2 Vaccination 11/10/2019, 12/07/2019, 05/16/2020   PFIZER(Purple Top)SARS-COV-2 Vaccination 11/10/2019   Pneumococcal Conjugate-13 06/26/2013   Pneumococcal Polysaccharide-23 07/21/2009, 06/27/2015   Td 03/24/2009   Zoster Recombinat (Shingrix) 04/02/2017   Health Maintenance Due  Topic Date Due   OPHTHALMOLOGY EXAM  Never done      Past Medical History:  Diagnosis Date   CAD (coronary artery disease)    a. s/p BMS pRCA 2002; b. DES to pRCA & DES p/m RCA 2006; c. PCI/DES OM4 2007; d. PCI/CBA to RCA for ISR 10/2006; e.08/2012 STEMI/Cath/PCI:  LAD 95% >> PCI: Promus  Prem DES  //  f. NSTEMI 10/15 >> LHC: mLAD stent ok, dLAD 70, OM1 CTO, OM2 stent ok, dOM2 90, OM3 30-40, dLCx 90, p-mRCA stent ok, mRCA stent ok w/ 60-70 ISR, EF 50% >> med Rx // MV 1/17: no ischemia // Cath ([facility]) 05/2019: RCA 100 (CTO)    Combined systolic and diastolic CHF    a. Myoview 1/17: EF 35%  //  b. Echo 1/17 EF 45-50% // Echo 05/2019: EF 50-55 // Echocardiogram 8/21: EF 50, inf AK, post and mid inf HK, mod LVH, RVSP 25.3, mild to mod LAE, trivial MR    Depression    Diabetes mellitus ([place])    a. A1c 8.8 08/2012->Metformin initiated. => b. A1c (9/14): 6.6   Gastroesophageal reflux disease    History of nuclear stress test    a. Myoview 1/17: EF 35%, fixed inferior lateral defect consistent with infarct, no ischemia, intermediate risk   History of pneumonia    History of stroke 01/2021   Zio Monitor 11/22: NSR, avg HR 61; PVCs (1.9%); no AFib, no high grade HB, no ventricular arrhythmias   HTN (hypertension)    Hx of NSTEMI    Hyperlipidemia    Ischemic cardiomyopathy    a. EF 40%; improved to normal;  b. 08/2012 Echo: EF 50-55%, mod LVH.//  c. Echo 9/16: Inf HK, mild LVH, EF 55%, mild LAE, normal RVF, mild RAE, PASP  35 mmHg  //  d. Echo 1/17: EF 45-50%, inferior HK, mild BAE, PASP 33 mmHg   Obesity    OSA (obstructive sleep apnea)    Does not use CPAP as of 05/2011   Tobacco abuse    Past Surgical History:  Procedure Laterality Date   CORONARY ANGIOPLASTY WITH STENT PLACEMENT     CORONARY STENT PLACEMENT  2009   LEFT HEART CATHETERIZATION WITH CORONARY ANGIOGRAM N/A 08/31/2012   Procedure: LEFT HEART CATHETERIZATION WITH CORONARY ANGIOGRAM;  Surgeon: Burnell Blanks, MD;  Location: [facility] CATH LAB;  Service: Cardiovascular;  Laterality: N/A;   LEFT HEART CATHETERIZATION WITH CORONARY ANGIOGRAM N/A 11/16/2013   Procedure: LEFT HEART CATHETERIZATION WITH CORONARY ANGIOGRAM;  Surgeon: Blane Ohara, MD;  Location: [facility] CATH LAB;  Service: Cardiovascular;  Laterality: N/A;   LEFT  HEART CATHETERIZATION WITH CORONARY ANGIOGRAM N/A 03/15/2014   Procedure: LEFT HEART CATHETERIZATION WITH CORONARY ANGIOGRAM;  Surgeon: Peter M Martinique, MD;  Location: [facility] CATH LAB;  Service: Cardiovascular;  Laterality: N/A;   PERCUTANEOUS CORONARY STENT INTERVENTION (PCI-S) Right 08/31/2012   Procedure: PERCUTANEOUS CORONARY STENT INTERVENTION (PCI-S);  Surgeon: Burnell Blanks, MD;  Location: [facility] CATH LAB;  Service: Cardiovascular;  Laterality: Right;   PERCUTANEOUS CORONARY STENT INTERVENTION (PCI-S)  11/16/2013   Procedure: PERCUTANEOUS CORONARY STENT INTERVENTION (PCI-S);  Surgeon: Blane Ohara, MD;  Location: [facility] CATH LAB;  Service: Cardiovascular;;    reports that he has quit smoking. His smoking use included cigarettes. He has a 15.00 pack-year smoking history. He has never used smokeless tobacco. He reports that he does not drink alcohol and does not use drugs. family history includes Cancer in his mother; Coronary artery disease in an other family member; Depression in his mother; Hypertension in his mother; Other in his father. Allergies  Allergen Reactions   Penicillin G Other (See Comments)    Did it involve swelling of the face/tongue/throat, SOB, or low BP?  N/A Did it involve sudden or severe rash/hives, skin peeling, or any reaction on the inside of your mouth or nose? N/A Did you need to seek medical attention at a hospital or doctor's office? N/A When did it last happen? Child If all above answers are NO, may proceed with cephalosporin use.   Penicillins Other (See Comments)    Did it involve swelling of the face/tongue/throat, SOB, or low BP? N/A Did it involve sudden or severe rash/hives, skin peeling, or any reaction on the inside of your mouth or nose?N/A Did you need to seek medical attention at a hospital or doctor's office? N/A When did it last happen? Child      If all above answers are NO, may proceed with cephalosporin use.   Current Outpatient Medications  on File Prior to Visit  Medication Sig Dispense Refill   Alcohol Swabs (B-D SINGLE USE SWABS BUTTERFLY) PADS Use up to 4 times a day to test blood sugars. Dx: E10.9, E11.9 100 each 11   aspirin 81 MG EC tablet Take 81 mg by mouth daily.     atorvastatin (LIPITOR) 80 MG tablet Take 1 tablet (80 mg total) by mouth at bedtime. 30 tablet 3   Blood Glucose Calibration (TRUE METRIX LEVEL 1) Low SOLN Use as needed. Dx: E10.9, E11.9 1 each 0   blood glucose meter kit and supplies Dispense per insurance preference. Use as directed once daily. (FOR ICD-10  E11.9). 1 each 0   carvedilol (COREG) 25 MG tablet TAKE 1 TABLET(25 MG) BY MOUTH TWICE  DAILY WITH A MEAL 180 tablet 1   cetirizine (ZYRTEC) 10 MG tablet TAKE 1 TABLET BY MOUTH DAILY 90 tablet 3   clopidogrel (PLAVIX) 75 MG tablet Take 1 tablet (75 mg total) by mouth daily. 90 tablet 3   gabapentin (NEURONTIN) 300 MG capsule TAKE 1 CAPSULE(300 MG) BY MOUTH THREE TIMES DAILY (Patient taking differently: 300 mg 2 (two) times daily.) 270 capsule 1   glipiZIDE (GLUCOTROL) 5 MG tablet Take 1 tablet (5 mg total) by mouth 2 (two) times daily. 60 tablet 11   glucose blood (TRUE METRIX BLOOD GLUCOSE TEST) test strip Use to test blood sugars up to 4 times a day. DX: E10.9, E.11.9 100 each 12   hydrocortisone cream 1 % Apply to affected area 2 times daily 15 g 0   isosorbide mononitrate (IMDUR) 30 MG 24 hr tablet TAKE 3 TABLETS(90 MG) BY MOUTH DAILY 270 tablet 0   Lancets 33G MISC Use to test blood sugars up to 4 times a day. DX: E10.9, E11.9 100 each 6   Multiple Vitamin (MULTIVITAMIN) tablet Take 1 tablet by mouth daily.     nitroGLYCERIN (NITROSTAT) 0.4 MG SL tablet PLACE 1 TABLET UNDER THE TONGUE EVERY 5 MINUTES AS NEEDED FOR CHEST PAIN 25 tablet 5   Nutritional Supplements (VITAMIN D BOOSTER PO) Take by mouth.     pantoprazole (PROTONIX) 40 MG tablet Take 1 tablet (40 mg total) by mouth daily. 90 tablet 3   PARoxetine (PAXIL) 20 MG tablet TAKE 1 TABLET(20 MG)  BY MOUTH DAILY 90 tablet 1   potassium chloride (KLOR-CON) 10 MEQ tablet TAKE 2 TABLETS(20 MEQ) BY MOUTH TWICE DAILY 360 tablet 2   spironolactone (ALDACTONE) 25 MG tablet TAKE 1/2 TABLET(12.5 MG) BY MOUTH DAILY 45 tablet 3   torsemide (DEMADEX) 20 MG tablet Take 1 tablet (20 mg total) by mouth 2 (two) times daily. 270 tablet 3   traMADol (ULTRAM) 50 MG tablet TAKE 1 TABLET(50 MG) BY MOUTH EVERY 6 HOURS AS NEEDED (Patient taking differently: 50 mg. TAKE 1 TABLET(50 MG) BY MOUTH EVERY 6 HOURS AS NEEDED - uses up to 2 tablets daily) 120 tablet 0   vitamin B-12 (CYANOCOBALAMIN) 1000 MCG tablet Take 1 tablet (1,000 mcg total) by mouth daily. 90 tablet 3   No current facility-administered medications on file prior to visit.        ROS:  All others reviewed and negative.  Objective        PE:  BP (!) 142/88 (BP Location: Right Arm, Patient Position: Sitting, Cuff Size: Large)    Pulse (!) 52    Temp 98.3 F (36.8 C) (Oral)    Ht $R'6\' 1"'es$  (1.854 m)    Wt (!) 326 lb (147.9 kg)    SpO2 93%    BMI 43.01 kg/m                 Constitutional: Pt appears in NAD               HENT: Head: NCAT.                Right Ear: External ear normal.                 Left Ear: External ear normal.                Eyes: . Pupils are equal, round, and reactive to light. Conjunctivae and EOM are normal  Nose: without d/c or deformity               Neck: Neck supple. Gross normal ROM               Cardiovascular: Normal rate and regular rhythm.                 Pulmonary/Chest: Effort normal and breath sounds without rales or wheezing.                Abd:  Soft, NT, ND, + BS, no organomegaly               Neurological: Pt is alert. At baseline orientation, motor grossly intact               Skin: Skin is warm. No rashes, no other new lesions, LE edema - none               Psychiatric: Pt behavior is normal without agitation   Micro: none  Cardiac tracings I have personally interpreted today:   none  Pertinent Radiological findings (summarize): none   Lab Results  Component Value Date   WBC 7.5 07/12/2021   HGB 15.2 07/12/2021   HCT 47.5 07/12/2021   PLT 190.0 07/12/2021   GLUCOSE 90 07/12/2021   CHOL 186 07/12/2021   TRIG 105.0 07/12/2021   HDL 47.40 07/12/2021   LDLCALC 118 (H) 07/12/2021   ALT 13 07/12/2021   AST 17 07/12/2021   NA 145 07/12/2021   K 4.5 07/12/2021   CL 106 07/12/2021   CREATININE 1.81 (H) 07/12/2021   BUN 27 (H) 07/12/2021   CO2 32 07/12/2021   TSH 1.83 07/12/2021   PSA 1.00 07/12/2021   INR 1.0 02/05/2021   HGBA1C 7.8 (H) 07/12/2021   MICROALBUR 64.1 (H) 06/23/2020   Assessment/Plan:  [name] [name] is a 66 y.o. Black or African American [2] male with  has a past medical history of CAD (coronary artery disease), Combined systolic and diastolic CHF, Depression, Diabetes mellitus ([place]), Gastroesophageal reflux disease, History of nuclear stress test, History of pneumonia, History of stroke (01/2021), HTN (hypertension), Hx of NSTEMI, Hyperlipidemia, Ischemic cardiomyopathy, Obesity, OSA (obstructive sleep apnea), and Tobacco abuse.  Encounter for well adult exam with abnormal findings Age and sex appropriate education and counseling updated with regular exercise and diet Referrals for preventative services - due for optho -  for referral Immunizations addressed - declines covid booster, shingrix, tdap Smoking counseling  - none needed Evidence for depression or other mood disorder - much improved to continue current med paxil Most recent labs reviewed. I have personally reviewed and have noted: 1) the patient's medical and social history 2) The patient's current medications and supplements 3) The patient's height, weight, and BMI have been recorded in the chart   B12 deficiency Lab Results  Component Value Date   VITAMINB12 333 07/12/2021   Stable, cont oral replacement - b12 1000 mcg qd   CKD (chronic kidney disease), stage III  ([place]) Lab Results  Component Value Date   CREATININE 1.81 (H) 07/12/2021   Stable overall, cont to avoid nephrotoxins   DM2 (diabetes mellitus, type 2) ([place]) Lab Results  Component Value Date   HGBA1C 7.8 (H) 07/12/2021   uncontrlled,, pt to cont medical treatment glipizidde and add ozempic, DM diet, also for optho referral   Essential hypertension BP Readings from Last 3 Encounters:  07/12/21 (!) 142/88  03/03/21 (!) 180/100  02/06/21 122/69   uncontrolled, pt  to increase losartan 100  mg  Hyperlipidemia Lab Results  Component Value Date   LDLCALC 118 (H) 07/12/2021   Uncontrolled,goal ldl < 70,  pt to resume lipitor 80 and encouraged compliance   Vitamin D deficiency Last vitamin D Lab Results  Component Value Date   VD25OH 33.85 07/12/2021   Low, to start oral replacement   Former smoker Pt encouraged contd abstinence  Followup: Return in about 3 months (around 10/09/2021).  Cathlean Cower, MD 07/16/2021 5:16 PM [facility]

## 2021-07-12 NOTE — Patient Instructions (Addendum)
You will be contacted regarding the referral for: eye doctor  Ok to increase the losartan to 100 mg per day  Please take all new medication as prescribed - the ozempic for sugar AND for weight loss (appetite suppressant)  Please continue all other medications as before, and refills have been done if requested.  Please have the pharmacy call with any other refills you may need.  Please continue your efforts at being more active, low cholesterol diet, and weight control.  You are otherwise up to date with prevention measures today.  Please keep your appointments with your specialists as you may have planned  Please go to the LAB at the blood drawing area for the tests to be done  You will be contacted by phone if any changes need to be made immediately.  Otherwise, you will receive a letter about your results with an explanation, but please check with MyChart first.  Please remember to sign up for MyChart if you have not done so, as this will be important to you in the future with finding out test results, communicating by private email, and scheduling acute appointments online when needed.  Please make an Appointment to return in 3 months, or sooner if needed

## 2021-07-12 NOTE — Progress Notes (Signed)
wq 

## 2021-07-16 ENCOUNTER — Encounter: Payer: Self-pay | Admitting: Internal Medicine

## 2021-07-16 NOTE — Assessment & Plan Note (Signed)
Age and sex appropriate education and counseling updated with regular exercise and diet Referrals for preventative services - due for optho -  for referral Immunizations addressed - declines covid booster, shingrix, tdap Smoking counseling  - none needed Evidence for depression or other mood disorder - much improved to continue current med paxil Most recent labs reviewed. I have personally reviewed and have noted: 1) the patient's medical and social history 2) The patient's current medications and supplements 3) The patient's height, weight, and BMI have been recorded in the chart

## 2021-07-16 NOTE — Assessment & Plan Note (Signed)
Last vitamin D Lab Results  Component Value Date   VD25OH 33.85 07/12/2021   Low, to start oral replacement

## 2021-07-16 NOTE — Assessment & Plan Note (Signed)
BP Readings from Last 3 Encounters:  07/12/21 (!) 142/88  03/03/21 (!) 180/100  02/06/21 122/69   uncontrolled, pt to increase losartan 100  mg

## 2021-07-16 NOTE — Assessment & Plan Note (Signed)
Lab Results  Component Value Date   LDLCALC 118 (H) 07/12/2021   Uncontrolled,goal ldl < 70,  pt to resume lipitor 80 and encouraged compliance

## 2021-07-16 NOTE — Assessment & Plan Note (Signed)
Pt encouraged contd abstinence

## 2021-07-16 NOTE — Assessment & Plan Note (Signed)
Lab Results  Component Value Date   VITAMINB12 333 07/12/2021   Stable, cont oral replacement - b12 1000 mcg qd

## 2021-07-16 NOTE — Assessment & Plan Note (Addendum)
Lab Results  Component Value Date   HGBA1C 7.8 (H) 07/12/2021   uncontrlled,, pt to cont medical treatment glipizidde and add ozempic, DM diet, also for optho referral

## 2021-07-16 NOTE — Assessment & Plan Note (Signed)
Lab Results  Component Value Date   CREATININE 1.81 (H) 07/12/2021   Stable overall, cont to avoid nephrotoxins

## 2021-07-19 ENCOUNTER — Inpatient Hospital Stay: Payer: Medicare HMO | Admitting: Neurology

## 2021-07-21 ENCOUNTER — Telehealth: Payer: Self-pay | Admitting: *Deleted

## 2021-07-21 ENCOUNTER — Telehealth: Payer: Medicare HMO

## 2021-07-21 ENCOUNTER — Encounter: Payer: Self-pay | Admitting: *Deleted

## 2021-07-21 NOTE — Telephone Encounter (Signed)
°  Chronic Care Management   Follow Up Note   07/21/2021 Name: [name] [name] MRN: [id number] DOB: [birth year]  Referred by: Biagio Borg, MD Reason for referral : Chronic Care Management (CCM RN CM Follow Up Telephone Visit DMII; CHF; Unsuccessful outreach)  An unsuccessful telephone outreach was attempted today. The patient was referred to the case management team for assistance with care management and care coordination.   Attempted scheduled call x 2-- with both attempts, phone rang without physical or voicemail pick up- call ended up spontaneously ending/ cutting off/ disconnecting  Follow Up Plan:  Will place request with scheduling care guide to contact patient to re-schedule today's missed CCM RN follow up telephone appointment if I do not hear back from patient by end of day  [name], RN, BSN, [place] [phone]: direct office

## 2021-07-24 ENCOUNTER — Telehealth: Payer: Self-pay | Admitting: Internal Medicine

## 2021-07-24 ENCOUNTER — Other Ambulatory Visit: Payer: Self-pay

## 2021-07-24 MED ORDER — ATORVASTATIN CALCIUM 80 MG PO TABS: 80.0000 mg | ORAL_TABLET | Freq: Every day | ORAL | 2 refills | Status: DC

## 2021-07-24 MED ORDER — EMPAGLIFLOZIN 25 MG PO TABS: 25.0000 mg | ORAL_TABLET | Freq: Every day | ORAL | 3 refills | Status: DC

## 2021-07-24 MED ORDER — LOSARTAN POTASSIUM 100 MG PO TABS: 100.0000 mg | ORAL_TABLET | Freq: Every day | ORAL | 3 refills | Status: DC

## 2021-07-24 NOTE — Telephone Encounter (Signed)
Spoke with a reprasentative with [employer]. Cholesterol medication has not been prescribed by Dr. [name]. All other medications sent to pharmacy.

## 2021-07-24 NOTE — Telephone Encounter (Signed)
Sheria Lang from [employer] [phone] called.  Patient would like to take advantage of their benefits that give them a discount for getting 100 days of their medications.  Patients would like 100  days of the following medications  Jardiance Losartain Rosuvastatin  Please send these to [facility]  on [address] in [place]  ** if 100 day supply can not be given please do 99 day supply

## 2021-07-24 NOTE — Telephone Encounter (Signed)
[place] with me for this request - to taylor please

## 2021-08-07 ENCOUNTER — Telehealth: Payer: Self-pay | Admitting: *Deleted

## 2021-08-07 NOTE — Chronic Care Management (AMB) (Signed)
°  Care Management   Note  08/07/2021 Name: [name] MRN: [id number] DOB: Oct 16, 1955  [name] [name] is a 66 y.o. year old male who is a primary care patient of Biagio Borg, MD and is actively engaged with the care management team. I reached out to [facility] by phone today to assist with re-scheduling a follow up visit with the RN Case Manager  Follow up plan: Unsuccessful telephone outreach attempt made. The care management team will reach out to the patient again over the next 7 days. If patient returns call to provider office, please advise to call [place] at [phone].  [place] Management  Direct Dial: [phone]

## 2021-08-08 NOTE — Chronic Care Management (AMB) (Signed)
°  Care Management   Note  08/08/2021 Name: [name] [name] MRN: [id number] DOB: Jul 05, 1955  [name] [name] is a 66 y.o. year old male who is a primary care patient of Biagio Borg, MD and is actively engaged with the care management team. I reached out to [name] by phone today to assist with re-scheduling a follow up visit with the RN Case Manager  Follow up plan: Telephone appointment with care management team member scheduled for:08/23/21  [place] Management  Direct Dial: [phone]

## 2021-08-15 ENCOUNTER — Inpatient Hospital Stay: Payer: Medicaid Other | Admitting: Neurology

## 2021-08-23 ENCOUNTER — Telehealth: Payer: Self-pay | Admitting: *Deleted

## 2021-08-23 ENCOUNTER — Telehealth: Payer: Medicare Other

## 2021-08-23 ENCOUNTER — Encounter: Payer: Self-pay | Admitting: *Deleted

## 2021-08-23 NOTE — Telephone Encounter (Signed)
?  Chronic Care Management  ? ?Follow Up Note ? ? ?08/23/2021 ?Name: Deigo Alonso MRN: [id number] DOB: 12-09-1955 ? ?Referred by: Biagio Borg, MD ?Reason for referral : Chronic Care Management (CCM RN CM Follow Up Telephone Outreach- unsuccessful second attempt) ? ?A second unsuccessful telephone outreach was attempted today. The patient was referred to the case management team for assistance with care management and care coordination.  ? ?Received automated outgoing message stating that patient's voice mail box is full; unable to leave message requesting call-back ? ?Follow Up Plan:  ? ?Will place request with scheduling care guide to contact patient to re-schedule today's missed CCM RN follow up telephone appointment if I do not hear back from patient by end of day ? ?[name], RN, BSN, CCRN Alumnus ?[place] ?[phone]: direct office ? ? ?

## 2021-08-26 ENCOUNTER — Other Ambulatory Visit: Payer: Self-pay | Admitting: Internal Medicine

## 2021-08-26 DIAGNOSIS — I2510 Atherosclerotic heart disease of native coronary artery without angina pectoris: Secondary | ICD-10-CM

## 2021-08-31 ENCOUNTER — Ambulatory Visit (INDEPENDENT_AMBULATORY_CARE_PROVIDER_SITE_OTHER): Payer: Medicare Other | Admitting: *Deleted

## 2021-08-31 DIAGNOSIS — I10 Essential (primary) hypertension: Secondary | ICD-10-CM

## 2021-08-31 DIAGNOSIS — E1159 Type 2 diabetes mellitus with other circulatory complications: Secondary | ICD-10-CM

## 2021-08-31 DIAGNOSIS — I5042 Chronic combined systolic (congestive) and diastolic (congestive) heart failure: Secondary | ICD-10-CM

## 2021-08-31 NOTE — Chronic Care Management (AMB) (Signed)
?Chronic Care Management  ? ?CCM RN Visit Note ? ?08/31/2021 ?Name: [name] [name] MRN: [id number] DOB: [birth year] ? ?Subjective: ?[name] [name] is a 66 y.o. year old male who is a primary care patient of Biagio Borg, MD. The care management team was consulted for assistance with disease management and care coordination needs.   ? ?Engaged with patient by telephone for follow up visit in response to provider referral for case management and/or care coordination services.  ? ?Consent to Services:  ?The patient was given information about Chronic Care Management services, agreed to services, and gave verbal consent prior to initiation of services.  Please see initial visit note for detailed documentation.  ?Patient agreed to services and verbal consent obtained.  ? ?Assessment: Review of patient past medical history, allergies, medications, health status, including review of consultants reports, laboratory and other test data, was performed as part of comprehensive evaluation and provision of chronic care management services.  ? ?SDOH (Social Determinants of Health) assessments and interventions performed:  ?SDOH Interventions   ? ?Flowsheet Row Most Recent Value  ?SDOH Interventions   ?Food Insecurity Interventions Intervention Not Indicated  [confirmed patient spoke with [facility] team and has been provided resources]  ?Housing Interventions Intervention Not Indicated  [continues living alone]  ?Transportation Interventions Intervention Not Indicated  [reports today he is using his insurance benefit for transportation to provider office visits,  declines need for additional resources today]  ? ?  ?CCM Care Plan ? ?Allergies  ?Allergen Reactions  ? Penicillin G Other (See Comments)  ?  Did it involve swelling of the face/tongue/throat, SOB, or low BP?  N/A ?Did it involve sudden or severe rash/hives, skin peeling, or any reaction on the inside of your mouth or nose? N/A ?Did you need to seek  medical attention at a hospital or doctor's office? N/A ?When did it last happen? Child ?If all above answers are ?NO?, may proceed with cephalosporin use.  ? Penicillins Other (See Comments)  ?  Did it involve swelling of the face/tongue/throat, SOB, or low BP? N/A ?Did it involve sudden or severe rash/hives, skin peeling, or any reaction on the inside of your mouth or nose?N/A ?Did you need to seek medical attention at a hospital or doctor's office? N/A ?When did it last happen? Child      ?If all above answers are ?NO?, may proceed with cephalosporin use.  ? ?Outpatient Encounter Medications as of 08/31/2021  ?Medication Sig Note  ? aspirin 81 MG EC tablet Take 81 mg by mouth daily.   ? atorvastatin (LIPITOR) 80 MG tablet Take 1 tablet (80 mg total) by mouth at bedtime.   ? empagliflozin (JARDIANCE) 25 MG TABS tablet Take 1 tablet (25 mg total) by mouth daily before breakfast.   ? losartan (COZAAR) 100 MG tablet Take 1 tablet (100 mg total) by mouth daily.   ? potassium chloride (KLOR-CON) 10 MEQ tablet TAKE 2 TABLETS(20 MEQ) BY MOUTH TWICE DAILY   ? spironolactone (ALDACTONE) 25 MG tablet TAKE 1/2 TABLET(12.5 MG) BY MOUTH DAILY   ? torsemide (DEMADEX) 20 MG tablet Take 1 tablet (20 mg total) by mouth 2 (two) times daily.   ? Alcohol Swabs (B-D SINGLE USE SWABS BUTTERFLY) PADS Use up to 4 times a day to test blood sugars. Dx: E10.9, E11.9   ? Blood Glucose Calibration (TRUE METRIX LEVEL 1) Low SOLN Use as needed. Dx: E10.9, E11.9   ? blood glucose meter kit and supplies Dispense per insurance  preference. Use as directed once daily. (FOR ICD-10  E11.9).   ? carvedilol (COREG) 25 MG tablet TAKE 1 TABLET(25 MG) BY MOUTH TWICE DAILY WITH A MEAL   ? cetirizine (ZYRTEC) 10 MG tablet TAKE 1 TABLET BY MOUTH DAILY   ? clopidogrel (PLAVIX) 75 MG tablet Take 1 tablet (75 mg total) by mouth daily.   ? gabapentin (NEURONTIN) 300 MG capsule TAKE 1 CAPSULE(300 MG) BY MOUTH THREE TIMES DAILY (Patient taking differently: 300 mg 2  (two) times daily.)   ? glipiZIDE (GLUCOTROL) 5 MG tablet Take 1 tablet (5 mg total) by mouth 2 (two) times daily. (Patient not taking: Reported on 08/31/2021) 08/31/2021: 08/31/21- Reports stopped taking due to frequent low blood sugars; will make CCM Pharmacist aware of same  ? glucose blood (TRUE METRIX BLOOD GLUCOSE TEST) test strip Use to test blood sugars up to 4 times a day. DX: E10.9, E.11.9   ? hydrocortisone cream 1 % Apply to affected area 2 times daily   ? isosorbide mononitrate (IMDUR) 30 MG 24 hr tablet TAKE 3 TABLETS(90 MG) BY MOUTH DAILY   ? Lancets 33G MISC Use to test blood sugars up to 4 times a day. DX: E10.9, E11.9   ? Multiple Vitamin (MULTIVITAMIN) tablet Take 1 tablet by mouth daily.   ? nitroGLYCERIN (NITROSTAT) 0.4 MG SL tablet PLACE 1 TABLET UNDER THE TONGUE EVERY 5 MINUTES AS NEEDED FOR CHEST PAIN   ? Nutritional Supplements (VITAMIN D BOOSTER PO) Take by mouth.   ? pantoprazole (PROTONIX) 40 MG tablet Take 1 tablet (40 mg total) by mouth daily.   ? PARoxetine (PAXIL) 20 MG tablet TAKE 1 TABLET(20 MG) BY MOUTH DAILY   ? Semaglutide,0.25 or 0.5MG/DOS, (OZEMPIC, 0.25 OR 0.5 MG/DOSE,) 2 MG/1.5ML SOPN Inject 0.5 mg into the skin once a week. (Patient not taking: Reported on 08/31/2021) 08/31/2021: 08/31/21- patient reports has not been taking- states "never prescribed"- CCM Pharmacist made aware  ? traMADol (ULTRAM) 50 MG tablet TAKE 1 TABLET(50 MG) BY MOUTH EVERY 6 HOURS AS NEEDED   ? vitamin B-12 (CYANOCOBALAMIN) 1000 MCG tablet Take 1 tablet (1,000 mcg total) by mouth daily.   ? ?No facility-administered encounter medications on file as of 08/31/2021.  ? ?Patient Active Problem List  ? Diagnosis Date Noted  ? TIA (transient ischemic attack) 02/05/2021  ? History of stroke 01/2021  ? Aortic atherosclerosis ([place]) 12/26/2020  ? Cough 12/26/2020  ? Vitamin D deficiency 04/03/2020  ? B12 deficiency 04/03/2020  ? Itching 11/22/2019  ? Chronic heart failure with preserved ejection fraction (HFpEF) ([place])  05/13/2019  ? Chronic coronary artery disease 05/13/2019  ? Elevated troponin 05/13/2019  ? Morbid obesity with BMI of 40.0-44.9, adult ([place]) 05/13/2019  ? Pre-ulcerative calluses 02/03/2019  ? Foot injury, left, initial encounter 11/26/2018  ? Chest pain 05/30/2017  ? Hypokalemia 05/30/2017  ? Allergic rhinitis 12/18/2016  ? Lumbar radiculitis 09/05/2016  ? Lumbar spinal stenosis 09/05/2016  ? CKD (chronic kidney disease), stage III ([place]) 12/29/2015  ? Chest pain syndrome 12/15/2015  ? Acute kidney injury ([place]) 12/15/2015  ? Atypical chest pain 12/15/2015  ? Encounter for well adult exam with abnormal findings 06/27/2015  ? Chronic low back pain 06/27/2015  ? Left lumbar radiculopathy 04/13/2015  ? Bilateral plantar fasciitis 04/13/2015  ? Essential hypertension 03/09/2015  ? OSA (obstructive sleep apnea) 03/27/2014  ? Peripheral neuropathy ([place]) 12/29/2013  ? External hemorrhoid, thrombosed 12/03/2013  ? Sinus bradycardia 11/15/2013  ? NSTEMI (non-ST elevated myocardial infarction) ([place]) 11/14/2013  ?  Chronic combined systolic and diastolic CHF  59/47/0761  ? CAD S/P multiple PCI   ? Hyperlipidemia   ? DM2 (diabetes mellitus, type 2) ([place]) 06/26/2013  ? Gait disorder 06/26/2013  ? Obesity, Class III, BMI 40-49.9 (morbid obesity) ([place]) 09/10/2012  ? Former smoker 09/02/2012  ? Ischemic cardiomyopathy- EF 50-55% June 2015   ? ST elevation myocardial infarction (STEMI) of anterior wall, initial episode of care [facility]) 09/01/2012  ? HEMORRHOIDS 12/26/2009  ? Acute prostatitis 12/26/2009  ? GANGLION CYST, WRIST, RIGHT 11/28/2009  ? COLONIC POLYPS 10/07/2009  ? Anxiety with depression 07/21/2009  ? DYSPNEA 03/24/2009  ? GASTROESOPHAGEAL REFLUX DISEASE 01/27/2007  ? PERCUTANEOUS TRANSLUMINAL CORONARY ANGIOPLASTY, HX OF 02/09/2006  ? ?Conditions to be addressed/monitored:  CHF and DMII ? ?Care Plan : RN Care Manager Plan of Care  ?Updates made by Knox Royalty, RN since 08/31/2021 12:00 AM  ?  ? ?Problem: Chronic Disease  Management Needs   ?Priority: High  ?  ? ?Long-Range Goal: Ongoing adherence to established plan of care for long term chronic disease management   ?Start Date: 01/25/2021  ?Expected End Date: 01/25/2022  ?P

## 2021-08-31 NOTE — Patient Instructions (Signed)
Visit Information ? ?Handsome, thank you for taking time to talk with me today. Please don't hesitate to contact me if I can be of assistance to you before our next scheduled telephone appointment ? ?Below are the goals we discussed today:  ?Patient Self-Care Activities: ?Patient Zylen will: ?Take medications as prescribed and continue to work with the Pharmacy team at Dr. Gwynn Burly office ?Attend all scheduled provider appointments ?Call pharmacy for medication refills ?Call provider office for new concerns or questions ?Great job checking your blood sugars at home! Keep up the great work! Be sure to write down the blood sugars from home on paper ?Check blood sugars first thing in the morning before eating: the goal for these blood sugars is between 100-120 ?Check blood sugars later during the day 2 hours after eating a regular meal: the goal for these blood sugars is between 160-180 ?Continue to take precautions to prevent falls- use your cane as needed ?Continue to do your best to follow a heart healthy, low salt, low carbohydrate/ low sugar diet ?  ?Our next scheduled telephone follow up visit/ appointment is scheduled on: Tuesday, September 19, 2021 at 11:30 am- This is a PHONE [facility] appointment ? ?If you need to cancel or re-schedule our visit, please call [phone] and our care guide team will be happy to assist you. ?  ?I look forward to hearing about your progress. ?  ?[name], RN, BSN, CCRN Alumnus ?[facility] ?([phone]: direct office ? ?If you are experiencing a Mental Health or [place] or need someone to talk to, please  ?call the Suicide and Crisis Lifeline: 988 ?call the [place] National Suicide Prevention Lifeline: 713-356-9280 or TTY: (262) 014-7970 TTY (805)669-4128) to talk to a trained counselor ?call 1-800-273-TALK (toll free, 24 hour hotline) ?go to [facility] [address] [place]  [phone]) ?call 911  ? ?The patient verbalized understanding of instructions, educational materials, and care plan provided today and agreed to receive a mailed copy of patient instructions, educational materials, and care plan  ? ?Hypoglycemia-- LOW BLOOD SUGAR ?Hypoglycemia occurs when the level of sugar (glucose) in the blood is too low. Hypoglycemia can happen in people who have or do not have diabetes. It can develop quickly, and it can be a medical emergency. For most people, a blood glucose level below 70 mg/dL (3.9 mmol/L) is considered hypoglycemia. ?Glucose is a type of sugar that provides the body's main source of energy. Certain hormones (insulin and glucagon) control the level of glucose in the blood. Insulin lowers blood glucose, and glucagon raises blood glucose. Hypoglycemia can result from having too much insulin in the bloodstream, or from not eating enough food that contains glucose. You may also have reactive hypoglycemia, which happens within 4 hours after eating a meal. ?What are the causes? ?Hypoglycemia occurs most often in people who have diabetes and may be caused by: ?Diabetes medicine. ?Not eating enough, or not eating often enough. ?Increased physical activity. ?Drinking alcohol on an empty stomach. ?If you do not have diabetes, hypoglycemia may be caused by: ?A tumor in the pancreas. ?Not eating enough, or not eating for long periods at a time (fasting). ?A severe infection or illness. ?Problems after having bariatric surgery. ?Organ failure, such as kidney or liver failure. ?Certain medicines. ?What increases the risk? ?Hypoglycemia is more likely to develop in people who: ?Have diabetes and take medicines to lower blood glucose. ?Abuse alcohol. ?Have a  severe illness. ?What are the signs or symptoms? ?Symptoms vary depending on whether the condition is mild, moderate, or severe. ?Mild hypoglycemia ?Hunger. ?Sweating and feeling clammy. ?Dizziness or feeling  light-headed. ?Sleepiness or restless sleep. ?Nausea. ?Increased heart rate. ?Headache. ?Blurry vision. ?Mood changes, such as irritability or anxiety. ?Tingling or numbness around the mouth, lips, or tongue. ?Moderate hypoglycemia ?Confusion and poor judgment. ?Behavior changes. ?Weakness. ?Irregular heartbeat. ?A change in coordination. ?Severe hypoglycemia ?Severe hypoglycemia is a medical emergency. It can cause: ?Fainting. ?Seizures. ?Loss of consciousness (coma). ?Death. ?How is this diagnosed? ?Hypoglycemia is diagnosed with a blood test to measure your blood glucose level. This blood test is done while you are having symptoms. Your health care provider may also do a physical exam and review your medical history. ?How is this treated? ?This condition can be treated by immediately eating or drinking something that contains sugar with 15 grams of fast-acting carbohydrate, such as: ?4 oz (120 mL) of fruit juice. ?4 oz (120 mL) of regular soda (not diet soda). ?Several pieces of hard candy. Check food labels to find out how many pieces to eat for 15 grams. ?1 Tbsp (15 mL) of sugar or honey. ?4 glucose tablets. ?1 tube of glucose gel. ?Treating hypoglycemia if you have diabetes ?If you are alert and able to swallow safely, follow the 15:15 rule: ?Take 15 grams of a fast-acting carbohydrate. Talk with your health care provider about how much you should take. Options for getting 15 grams of fast-acting carbohydrate include: ?Glucose tablets (take 4 tablets). ?Several pieces of hard candy. Check food labels to find out how many pieces to eat for 15 grams. ?4 oz (120 mL) of fruit juice. ?4 oz (120 mL) of regular soda (not diet soda). ?1 Tbsp (15 mL) of sugar or honey. ?1 tube of glucose gel. ?Check your blood glucose 15 minutes after you take the carbohydrate. ?If the repeat blood glucose level is still at or below 70 mg/dL (3.9 mmol/L), take 15 grams of a carbohydrate again. ?If your blood glucose level does not  increase above 70 mg/dL (3.9 mmol/L) after 3 tries, seek emergency medical care. ?After your blood glucose level returns to normal, eat a meal or a snack within 1 hour. ? ?Treating severe hypoglycemia ?Severe hypoglycemia is when your blood glucose level is below 54 mg/dL (3 mmol/L). Severe hypoglycemia is a medical emergency. Get medical help right away. ?If you have severe hypoglycemia and you cannot eat or drink, you will need to be given glucagon. A family member or close friend should learn how to check your blood glucose and how to give you glucagon. Ask your health care provider if you need to have an emergency glucagon kit available. ?Severe hypoglycemia may need to be treated in a hospital. The treatment may include getting glucose through an IV. You may also need treatment for the cause of your hypoglycemia. ?Follow these instructions at home: ?General instructions ?Take over-the-counter and prescription medicines only as told by your health care provider. ?Monitor your blood glucose as told by your health care provider. ?If you drink alcohol: ?Limit how much you have to: ?0-1 drink a day for women who are not pregnant. ?0-2 drinks a day for men. ?Know how much alcohol is in your drink. In the [place] one drink equals one 12 oz bottle of beer (355 mL), one 5 oz glass of wine (148 mL), or one 1? oz glass of hard liquor (44 mL). ?Be sure to eat food along with  drinking alcohol. ?Be aware that alcohol is absorbed quickly and may have lingering effects that may result in hypoglycemia later. Be sure to do ongoing glucose monitoring. ?Keep all follow-up visits. This is important. ?If you have diabetes: ?Always have a fast-acting carbohydrate (15 grams) option with you to treat low blood glucose. ?Follow your diabetes management plan as directed by your health care provider. Make sure you: ?Know the symptoms of hypoglycemia. It is important to treat it right away to prevent it from becoming severe. ?Check your  blood glucose as often as told. Always check before and after exercise. ?Always check your blood glucose before you drive a motorized vehicle. ?Take your medicines as told. ?Follow your meal plan. Eat on time, and do not skip mea

## 2021-09-03 DIAGNOSIS — I493 Ventricular premature depolarization: Secondary | ICD-10-CM | POA: Insufficient documentation

## 2021-09-03 DIAGNOSIS — I471 Supraventricular tachycardia: Secondary | ICD-10-CM | POA: Insufficient documentation

## 2021-09-03 NOTE — Progress Notes (Deleted)
?Cardiology Office Note:   ? ?Date:  09/03/2021  ? ?ID:  [name] [name], DOB August 14, 1955, MRN [id number] ? ?PCP:  Biagio Borg, MD  ?[facility] Providers ?Cardiologist:  Sherren Mocha, MD ?Cardiology APP:  Liliane Shi, PA-C { ?Click to update primary MD,subspecialty MD or APP then REFRESH:1}  *** ?Referring MD: Biagio Borg, MD  ? ?Chief Complaint:  No chief complaint on file. ?{Click here for Visit Info    :1}  ? ?Patient Profile: ?Coronary artery disease ?S/p PCI to RCA and PL1 ?S/p cutting POBA to RCA 2/2 ISR in 5/08 ?S/p ant STEMI in 3/14 >> PCI: DES to LAD ?S/p NSTEMI in 6/15 >> PCI: DES to OM1 ?S/p NSTEMI 10/15 >> Med Rx ?Myoview 1/17: no ischemia ?Cath at [facility] 05/2019: RCA 100 CTO ?Heart failure with preserved ejection fraction  ?Ischemic CM ?Echocardiogram 05/2019 [facility]): EF 50-55 ?Echocardiogram 8/21: EF 50, inf AK ?Supraventricular tachycardia ?Monitor 9/21: Longest 17 seconds ?beta-blocker increased  ?PVCs (monitor 9/21: 6% burden) ?beta-blocker Rx  ?Hypertension ?Hyperlipidemia ?Chronic kidney disease ?Diabetes mellitus ?Hx of TIA in Aug 2022 ?OSA ?GERD ?Tobacco abuse ?Aortic atherosclerosis  ? ?Prior CV Studies: ?Zio Monitor 03/2021 ?The basic rhythm is normal sinus with an average heart rate of 61 bpm.  There are occasional PVCs with an overall burden of 1.9%.  There are no prolonged ventricular arrhythmias.  There is no atrial fibrillation or flutter identified.  There is no high-grade AV block noted. ? ?ECHOCARDIOGRAM 02/06/21 ?EF 45-50, global HK, moderate LVH, G1 DD, RVSP 15.1, mild LAE, trivial AI, dilated aortic root (37 mm) ?  ?ZIO monitor 02/2020 ?The basic rhythm is normal sinus with an average HR of 59 bpm with baseline IVCD/bundle branch block ?No atrial fibrillation or flutter ?No high-grade heart block or pathologic pauses ?There are frequent PVC's (6% burden) and rare, nonsustained episodes of idioventricular rhythm. There are rare ventricular runs, longest 6 beats ?There are  occasional supraventricular beats (3.7% burden) and rare supraventricular runs, longest lasting 17 seconds ?  ?Echocardiogram 01/14/20 ?EF 50, inf AK , post and inf HK, mod LVH, normal RVSF, RVSP 25.3, mild to mod LAE, trivial MR,  ?  ?Cardiac catheterization 05/14/2019 ([facility]) ?LM normal ?LAD diff irregs ?LCx diffuse irregs ?RCA mid 100 (CTO) ?  ?Echocardiogram 05/13/2019 ([facility]) ?Mod conc LVH, inf AK and likely post HK, EF 50-55 ?  ?Myoview 06/16/15 ?Intermediate risk stress nuclear study with a large, severe, predominantly fixed inferior lateral defect consistent with prior infarct; no significant ischemia; EF 35 but visually appears better; akinesis of the basal and mid inferior lateral wall; mild LVE; suggest echo to better assess LV function. Study intermediate risk due to reduced LV function. ?   ?Carotid US (4/13):  ?Bilateral: no ICA stenosis ?{Select studies to display:26339}  ? ?History of Present Illness:   ?[name] [name] is a 66 y.o. male with the above problem list.  He ***     ?   ?Past Medical History:  ?Diagnosis Date  ? CAD (coronary artery disease)   ? a. s/p BMS pRCA 2002; b. DES to pRCA & DES p/m RCA 2006; c. PCI/DES OM4 2007; d. PCI/CBA to RCA for ISR 10/2006; e.08/2012 STEMI/Cath/PCI:  LAD 95% >> PCI: Promus Prem DES  //  f. NSTEMI 10/15 >> LHC: mLAD stent ok, dLAD 70, OM1 CTO, OM2 stent ok, dOM2 90, OM3 30-40, dLCx 90, p-mRCA stent ok, mRCA stent ok w/ 60-70 ISR, EF 50% >> med Rx // MV 1/17: no  ischemia // Cath ([facility]) 05/2019: RCA 100 (CTO)   ? Combined systolic and diastolic CHF   ? a. Myoview 1/17: EF 35%  //  b. Echo 1/17 EF 45-50% // Echo 05/2019: EF 50-55 // Echocardiogram 8/21: EF 50, inf AK, post and mid inf HK, mod LVH, RVSP 25.3, mild to mod LAE, trivial MR   ? Depression   ? Diabetes mellitus ([place])   ? a. A1c 8.8 08/2012->Metformin initiated. => b. A1c (9/14): 6.6  ? Gastroesophageal reflux disease   ? History of nuclear stress test   ? a. Myoview 1/17: EF 35%, fixed inferior lateral defect  consistent with infarct, no ischemia, intermediate risk  ? History of pneumonia   ? History of stroke 01/2021  ? Zio Monitor 11/22: NSR, avg HR 61; PVCs (1.9%); no AFib, no high grade HB, no ventricular arrhythmias  ? HTN (hypertension)   ? Hx of NSTEMI   ? Hyperlipidemia   ? Ischemic cardiomyopathy   ? a. EF 40%; improved to normal;  b. 08/2012 Echo: EF 50-55%, mod LVH.//  c. Echo 9/16: Inf HK, mild LVH, EF 55%, mild LAE, normal RVF, mild RAE, PASP 35 mmHg  //  d. Echo 1/17: EF 45-50%, inferior HK, mild BAE, PASP 33 mmHg  ? Obesity   ? OSA (obstructive sleep apnea)   ? Does not use CPAP as of 05/2011  ? Tobacco abuse   ? ?Current Medications: ?No outpatient medications have been marked as taking for the 09/04/21 encounter (Appointment) with Liliane Shi, PA-C.  ?  ?Allergies:   Penicillin g and Penicillins  ? ?Social History  ? ?Tobacco Use  ? Smoking status: Former  ?  Packs/day: 0.50  ?  Years: 30.00  ?  Pack years: 15.00  ?  Types: Cigarettes  ? Smokeless tobacco: Never  ? Tobacco comments:  ?  trying to cut down  ?Vaping Use  ? Vaping Use: Never used  ?Substance Use Topics  ? Alcohol use: No  ? Drug use: No  ?  Comment: remote marijuana use  ?  ?Family Hx: ?The patient's family history includes Cancer in his mother; Coronary artery disease in an other family member; Depression in his mother; Hypertension in his mother; Other in his father. There is no history of Heart attack or Stroke. ? ?ROS  ? ?EKGs/Labs/Other Test Reviewed:   ? ?EKG:  EKG is *** ordered today.  The ekg ordered today demonstrates *** ? ?Recent Labs: ?01/18/2021: NT-Pro BNP 243 ?02/02/2021: B Natriuretic Peptide 178.9 ?07/12/2021: ALT 13; BUN 27; Creatinine, Ser 1.81; Hemoglobin 15.2; Platelets 190.0; Potassium 4.5; Sodium 145; TSH 1.83  ? ?Recent Lipid Panel ?Recent Labs  ?  07/12/21 ?1058  ?CHOL 186  ?TRIG 105.0  ?HDL 47.40  ?VLDL 21.0  ?LDLCALC 118*  ?  ? ?Risk Assessment/Calculations:   ?{Does this patient have ATRIAL  FIBRILLATION?:[id number]} ?    ?Physical Exam:   ? ?VS:  There were no vitals taken for this visit.   ? ?Wt Readings from Last 3 Encounters:  ?07/12/21 (!) 326 lb (147.9 kg)  ?03/03/21 (!) 314 lb 9.6 oz (142.7 kg)  ?02/06/21 (!) 318 lb (144.2 kg)  ?  ?Physical Exam *** ?    ?ASSESSMENT & PLAN:   ?No problem-specific Assessment & Plan notes found for this encounter. ?*** ?1. Coronary artery disease involving native coronary artery of native heart without angina pectoris ?Status post multiple PCI procedures in the setting of ACS and ST elevation myocardial infarctions.  Cardiac catheterization December 2020 with chronically occluded RCA and no significant obstructive disease in the LCx or LAD.   He has not had any chest discomfort to suggest angina.  Continue aspirin 81 mg daily, atorvastatin 80 mg daily, clopidogrel 75 mg daily, carvedilol 25 mg twice daily, isosorbide mononitrate 90 mg daily. ?  ?2. Chronic heart failure with preserved ejection fraction ([place]) ?EF 45-50 by echocardiogram done last month when admitted for TIA.  Volume status overall appears stable.  He is NYHA IIb.  Continue empagliflozin 10 mg daily, torsemide 20 mg twice daily, spironolactone 12.5 mg daily. ?  ?3. PVC's (premature ventricular contractions) ?Continue carvedilol 25 mg twice daily. ?  ?4. SVT (supraventricular tachycardia) ([place]) ?Continue carvedilol 25 mg twice daily.   ?  ?5. CKD (chronic kidney disease), stage II ?Recent creatinine stable.  His blood pressure is uncontrolled.  I will place him on losartan 50 mg daily.  He will need close follow-up on renal function.  I will repeat a BMET again in 2 weeks. ?  ?6. Essential hypertension ?As noted, his blood pressure is uncontrolled.  Repeat creatinine was 164/96.  He notes he did smoke a cigarette prior to coming in today.  Start losartan 50 mg daily.  Obtain follow-up BMET in 2 weeks.  Continue carvedilol 20 mg twice daily, isosorbide 90 mg daily, spironolactone 12.5 mg daily. ?  ?7.  Mixed hyperlipidemia ?Continue atorvastatin 80 mg daily. ?  ?8. History of TIA (transient ischemic attack) ?I will make sure he has follow-up with neurology.  Dr. Audie Box saw him for cardiology in the hospital and mentioned getting an ev

## 2021-09-04 ENCOUNTER — Ambulatory Visit: Payer: Medicare HMO | Admitting: Physician Assistant

## 2021-09-04 DIAGNOSIS — Z8673 Personal history of transient ischemic attack (TIA), and cerebral infarction without residual deficits: Secondary | ICD-10-CM

## 2021-09-04 DIAGNOSIS — I2510 Atherosclerotic heart disease of native coronary artery without angina pectoris: Secondary | ICD-10-CM

## 2021-09-04 DIAGNOSIS — I471 Supraventricular tachycardia: Secondary | ICD-10-CM

## 2021-09-04 DIAGNOSIS — I493 Ventricular premature depolarization: Secondary | ICD-10-CM

## 2021-09-04 DIAGNOSIS — I10 Essential (primary) hypertension: Secondary | ICD-10-CM

## 2021-09-04 DIAGNOSIS — E782 Mixed hyperlipidemia: Secondary | ICD-10-CM

## 2021-09-04 DIAGNOSIS — I700 Atherosclerosis of aorta: Secondary | ICD-10-CM

## 2021-09-04 DIAGNOSIS — I5032 Chronic diastolic (congestive) heart failure: Secondary | ICD-10-CM

## 2021-09-05 ENCOUNTER — Ambulatory Visit: Payer: Commercial Managed Care - HMO

## 2021-09-05 DIAGNOSIS — I700 Atherosclerosis of aorta: Secondary | ICD-10-CM

## 2021-09-05 DIAGNOSIS — E1159 Type 2 diabetes mellitus with other circulatory complications: Secondary | ICD-10-CM

## 2021-09-05 DIAGNOSIS — I10 Essential (primary) hypertension: Secondary | ICD-10-CM

## 2021-09-05 DIAGNOSIS — I5042 Chronic combined systolic (congestive) and diastolic (congestive) heart failure: Secondary | ICD-10-CM

## 2021-09-05 DIAGNOSIS — E7849 Other hyperlipidemia: Secondary | ICD-10-CM

## 2021-09-05 NOTE — Patient Instructions (Signed)
Visit Information ? ?Following are the goals we discussed today:  ? ?Track and Manage My Blood Sugars  ? ?Timeframe:  Long-Range Goal ?Priority:  High ?Start Date: 05/08/2021                            ?Expected End Date:  05/08/2022                    ? ?Follow Up Date 10/2021 ?  ?- check blood sugar at prescribed times ?- check blood sugar if I feel it is too high or too low ?- enter blood sugar readings and medication or insulin into daily log ?- take the blood sugar log to all doctor visits ?- take the blood sugar meter to all doctor visits  ?  ?Why is this important?   ?Checking your blood sugar at home helps to keep it from getting very high or very low.  ?Writing the results in a diary or log helps the doctor know how to care for you.  ?Your blood sugar log should have the time, date and the results.  ?Also, write down the amount of insulin or other medicine that you take.  ?Other information, like what you ate, exercise done and how you were feeling, will also be helpful.   ? ?Track and Manage My Blood Pressures  ? ?Timeframe:  Long-Range Goal ?Priority:  High ?Start Date:   05/08/2021                          ?Expected End Date:  05/08/2022                    ? ?Follow Up Date 10/2021 ?  ?- check blood pressure daily ?- choose a place to take my blood pressure (home, clinic or office, retail store) ?- write blood pressure results in a log or diary  ?  ?Why is this important?   ?You won't feel high blood pressure, but it can still hurt your blood vessels.  ?High blood pressure can cause heart or kidney problems. It can also cause a stroke.  ?Making lifestyle changes like losing a little weight or eating less salt will help.  ?Checking your blood pressure at home and at different times of the day can help to control blood pressure.  ?If the doctor prescribes medicine remember to take it the way the doctor ordered.  ?Call the office if you cannot afford the medicine or if there are questions about it. ? ?Plan:  Telephone follow up appointment with care management team member scheduled for:  2 months ?The patient has been provided with contact information for the care management team and has been advised to call with any health related questions or concerns.  ? ?Tomasa Blase, PharmD ?Clinical Pharmacist, [place]  ? ?Please call the care guide team at [phone] if you need to cancel or reschedule your appointment.  ? ?Patient verbalizes understanding of instructions and care plan provided today and agrees to view in [place]. Active MyChart status confirmed with patient.   ? ?

## 2021-09-05 NOTE — Progress Notes (Signed)
? ?Chronic Care Management ?Pharmacy Note ? ?09/05/2021 ?Name:  [name] [name] MRN:  [id number] DOB:  02/05/1956 ? ?Summary: ?-Per patient, has not yet started ozempic, states he didn't know it had been prescribed - stopped taking glipizide due to issues with hypoglycemia ?-Has not checked BP since latest increase in losartan ?-Appears that adherence remains a challenge for patient, has a weekly pill box that he will use at times, but can become overwhelming for patient and misses doses of medications  ? ?Recommendations/Changes made from today's visit: ?-Recommended for patient to start monitoring BP, BG, and weights daily, reviewed with patient BP, BG goals, along with monitoring parameters regarding weight ?-Recommended for patient to transfer prescriptions to a pharmacy that can provider adherence packaging, decided that best options for patient would be [facility]  or [facility] - will reach out with any issues transferring medication ?-Patient to continue hold of glipizide, agreeable to start ozempic (plans to start at 0.25mg  weekly x 4 weeks, then increase to 0.5mg  weekly) ? ?Subjective: ?[name] [name] is an 66 y.o. year old male who is a primary patient of [name], Hunt Oris, MD.  The CCM team was consulted for assistance with disease management and care coordination needs.   ? ?Engaged with patient by telephone for follow up visit in response to provider referral for pharmacy case management and/or care coordination services.  ? ?Consent to Services:  ?The patient was given the following information about Chronic Care Management services today, agreed to services, and gave verbal consent: 1. CCM service includes personalized support from designated clinical staff supervised by the primary care provider, including individualized plan of care and coordination with other care providers 2. 24/7 contact phone numbers for assistance for urgent and routine care needs. 3. Service will only be billed when  office clinical staff spend 20 minutes or more in a month to coordinate care. 4. Only one practitioner may furnish and bill the service in a calendar month. 5.The patient may stop CCM services at any time (effective at the end of the month) by phone call to the office staff. 6. The patient will be responsible for cost sharing (co-pay) of up to 20% of the service fee (after annual deductible is met). Patient agreed to services and consent obtained. ? ?Patient Care Team: ?Biagio Borg, MD as PCP - General (Internal Medicine) ?Sherren Mocha, MD as PCP - Cardiology (Cardiology) ?Sherren Mocha, MD as Consulting Physician (Cardiology) ?Sharmon Revere as Physician Assistant (Cardiology) ?Sueanne Margarita, MD as Consulting Physician (Sleep Medicine) ?Knox Royalty, RN as Case Manager ?Tomasa Blase, [facility] as Pharmacist (Pharmacist) ? ?Recent office visits:  ?07/12/2021 - Dr. [name]  - resume atorvastatin 80mg  daily, start ozempic and vitamin d ?  ?Recent consult visits:  ?06/26/2021 - Dr. [name] - Podiatry - follow up in 3 months  ?  ?Hospital visits:  ?None since last visit  ? ?Objective: ? ?Lab Results  ?Component Value Date  ? CREATININE 1.81 (H) 07/12/2021  ? BUN 27 (H) 07/12/2021  ? GFR 39.05 (L) 07/12/2021  ? GFRNONAA 56 (L) 02/06/2021  ? GFRAA 57 (L) 05/27/2020  ? NA 145 07/12/2021  ? K 4.5 07/12/2021  ? CALCIUM 9.7 07/12/2021  ? CO2 32 07/12/2021  ? GLUCOSE 90 07/12/2021  ? ? ?Lab Results  ?Component Value Date/Time  ? HGBA1C 7.8 (H) 07/12/2021 10:58 AM  ? HGBA1C 10.6 (H) 02/06/2021 03:23 AM  ? GFR 39.05 (L) 07/12/2021 10:58 AM  ? GFR 46.86 (  L) 12/26/2020 08:55 AM  ? MICROALBUR 64.1 (H) 06/23/2020 08:49 AM  ? MICROALBUR 49.0 (H) 05/14/2017 08:18 AM  ?  ?Last diabetic Eye exam:  ?No results found for: HMDIABEYEEXA  ?Last diabetic Foot exam:  ?No results found for: HMDIABFOOTEX  ? ?Lab Results  ?Component Value Date  ? CHOL 186 07/12/2021  ? HDL 47.40 07/12/2021  ? LDLCALC 118 (H) 07/12/2021  ? TRIG 105.0  07/12/2021  ? CHOLHDL 4 07/12/2021  ? ? ? ?  Latest Ref Rng & Units 07/12/2021  ? 10:58 AM 02/05/2021  ? 10:23 AM 02/02/2021  ?  8:27 AM  ?Hepatic Function  ?Total Protein 6.0 - 8.3 g/dL 7.4   7.6   7.0    ?Albumin 3.5 - 5.2 g/dL 4.3   3.9   3.7    ?AST 0 - 37 U/L $Remo'17   20   26    'qQoVS$ ?ALT 0 - 53 U/L $Remo'13   22   21    'krxsp$ ?Alk Phosphatase 39 - 117 U/L 92   59   61    ?Total Bilirubin 0.2 - 1.2 mg/dL 0.5   0.9   0.8    ?Bilirubin, Direct 0.0 - 0.3 mg/dL 0.1      ? ? ?Lab Results  ?Component Value Date/Time  ? TSH 1.83 07/12/2021 10:58 AM  ? TSH 0.86 06/23/2020 08:49 AM  ? ? ? ?  Latest Ref Rng & Units 07/12/2021  ? 10:58 AM 02/06/2021  ?  6:38 AM 02/05/2021  ? 10:59 AM  ?CBC  ?WBC 4.0 - 10.5 K/uL 7.5   6.2     ?Hemoglobin 13.0 - 17.0 g/dL 15.2   16.1   18.0    ?Hematocrit 39.0 - 52.0 % 47.5   50.3   53.0    ?Platelets 150.0 - 400.0 K/uL 190.0   265     ? ? ?Lab Results  ?Component Value Date/Time  ? VD25OH 33.85 07/12/2021 10:58 AM  ? VD25OH 41.96 06/23/2020 08:49 AM  ? ? ?Clinical ASCVD: Yes  ?The ASCVD Risk score (Arnett DK, et al., 2019) failed to calculate for the following reasons: ?  The patient has a prior MI or stroke diagnosis   ? ? ?  07/12/2021  ? 10:33 AM 05/29/2021  ?  9:00 AM 01/25/2021  ?  9:00 AM  ?Depression screen PHQ 2/9  ?Decreased Interest 0 0 0  ?Down, Depressed, Hopeless 0 0 1  ?PHQ - 2 Score 0 0 1  ?  ?Social History  ? ?Tobacco Use  ?Smoking Status Former  ? Packs/day: 0.50  ? Years: 30.00  ? Pack years: 15.00  ? Types: Cigarettes  ?Smokeless Tobacco Never  ?Tobacco Comments  ? trying to cut down  ? ?BP Readings from Last 3 Encounters:  ?07/12/21 (!) 142/88  ?03/03/21 (!) 180/100  ?02/06/21 122/69  ? ?Pulse Readings from Last 3 Encounters:  ?07/12/21 (!) 52  ?03/03/21 (!) 55  ?02/06/21 (!) 58  ? ?Wt Readings from Last 3 Encounters:  ?07/12/21 (!) 326 lb (147.9 kg)  ?03/03/21 (!) 314 lb 9.6 oz (142.7 kg)  ?02/06/21 (!) 318 lb (144.2 kg)  ? ?BMI Readings from Last 3 Encounters:  ?07/12/21 43.01 kg/m?  ?03/03/21  41.51 kg/m?  ?02/06/21 41.96 kg/m?  ? ? ?Assessment/Interventions: Review of patient past medical history, allergies, medications, health status, including review of consultants reports, laboratory and other test data, was performed as part of comprehensive evaluation and provision of chronic care management services.  ? ?  SDOH:  (Social Determinants of Health) assessments and interventions performed: Yes ? ?SDOH Screenings  ? ?Alcohol Screen: Low Risk   ? Last Alcohol Screening Score (AUDIT): 0  ?Depression (PHQ2-9): Low Risk   ? PHQ-2 Score: 0  ?Financial Resource Strain: Low Risk   ? Difficulty of Paying Living Expenses: Not hard at all  ?Food Insecurity: No Food Insecurity  ? Worried About Charity fundraiser in the Last Year: Never true  ? Ran Out of Food in the Last Year: Never true  ?Housing: Low Risk   ? Last Housing Risk Score: 0  ?Physical Activity: Inactive  ? Days of Exercise per Week: 0 days  ? Minutes of Exercise per Session: 0 min  ?Social Connections: Not on file  ?Stress: No Stress Concern Present  ? Feeling of Stress : Not at all  ?Tobacco Use: Medium Risk  ? Smoking Tobacco Use: Former  ? Smokeless Tobacco Use: Never  ? Passive Exposure: Not on file  ?Transportation Needs: No Transportation Needs  ? Lack of Transportation (Medical): No  ? Lack of Transportation (Non-Medical): No  ? ? ?CCM Care Plan ? ?Allergies  ?Allergen Reactions  ? Penicillin G Other (See Comments)  ?  Did it involve swelling of the face/tongue/throat, SOB, or low BP?  N/A ?Did it involve sudden or severe rash/hives, skin peeling, or any reaction on the inside of your mouth or nose? N/A ?Did you need to seek medical attention at a hospital or doctor's office? N/A ?When did it last happen? Child ?If all above answers are ?NO?, may proceed with cephalosporin use.  ? Penicillins Other (See Comments)  ?  Did it involve swelling of the face/tongue/throat, SOB, or low BP? N/A ?Did it involve sudden or severe rash/hives, skin peeling,  or any reaction on the inside of your mouth or nose?N/A ?Did you need to seek medical attention at a hospital or doctor's office? N/A ?When did it last happen? Child      ?If all above answers are ?NO?, may

## 2021-09-06 ENCOUNTER — Ambulatory Visit: Payer: Medicare HMO | Admitting: Physician Assistant

## 2021-09-08 DIAGNOSIS — E1159 Type 2 diabetes mellitus with other circulatory complications: Secondary | ICD-10-CM

## 2021-09-08 DIAGNOSIS — I5042 Chronic combined systolic (congestive) and diastolic (congestive) heart failure: Secondary | ICD-10-CM

## 2021-09-08 DIAGNOSIS — I10 Essential (primary) hypertension: Secondary | ICD-10-CM | POA: Diagnosis not present

## 2021-09-08 DIAGNOSIS — E7849 Other hyperlipidemia: Secondary | ICD-10-CM

## 2021-09-13 ENCOUNTER — Ambulatory Visit (INDEPENDENT_AMBULATORY_CARE_PROVIDER_SITE_OTHER): Payer: Medicare Other | Admitting: Internal Medicine

## 2021-09-13 VITALS — BP 140/88 | HR 67 | Temp 98.1°F | Ht 73.0 in | Wt 334.5 lb

## 2021-09-13 DIAGNOSIS — G8929 Other chronic pain: Secondary | ICD-10-CM

## 2021-09-13 DIAGNOSIS — E7849 Other hyperlipidemia: Secondary | ICD-10-CM | POA: Diagnosis not present

## 2021-09-13 DIAGNOSIS — E1159 Type 2 diabetes mellitus with other circulatory complications: Secondary | ICD-10-CM | POA: Diagnosis not present

## 2021-09-13 DIAGNOSIS — I10 Essential (primary) hypertension: Secondary | ICD-10-CM | POA: Diagnosis not present

## 2021-09-13 DIAGNOSIS — M545 Low back pain, unspecified: Secondary | ICD-10-CM | POA: Diagnosis not present

## 2021-09-13 DIAGNOSIS — J4520 Mild intermittent asthma, uncomplicated: Secondary | ICD-10-CM

## 2021-09-13 DIAGNOSIS — E559 Vitamin D deficiency, unspecified: Secondary | ICD-10-CM | POA: Diagnosis not present

## 2021-09-13 DIAGNOSIS — N1831 Chronic kidney disease, stage 3a: Secondary | ICD-10-CM | POA: Diagnosis not present

## 2021-09-13 MED ORDER — TRAMADOL HCL 50 MG PO TABS: ORAL_TABLET | ORAL | 2 refills | Status: DC

## 2021-09-13 MED ORDER — ALBUTEROL SULFATE HFA 108 (90 BASE) MCG/ACT IN AERS: 2.0000 | INHALATION_SPRAY | Freq: Four times a day (QID) | RESPIRATORY_TRACT | 11 refills | Status: DC | PRN

## 2021-09-13 NOTE — Patient Instructions (Signed)
Please take all new medication as prescribed - the ozempic as you just started ? ?Please continue all other medications as before, including to restart the 1/2 fluid pill as you mentioned, and the tramadol for pain ? ?You will be contacted regarding the referral for: Sports medicine for the lower back ? ?Please have the pharmacy call with any other refills you may need. ? ?Please continue your efforts at being more active, low cholesterol diet, and weight control. ? ?Please keep your appointments with your specialists as you may have planned ? ?Please make an Appointment to return in 3 months, or sooner if needed, also with Labs that can be done a few days early at the [facility]-  ? ? ? ? ?

## 2021-09-13 NOTE — Progress Notes (Signed)
? ?    Chief Complaint: follow up HTN, HLD and hyperglycemia, and obesity, ckd, asthma, lbp ? ?     HPI:  [name] [name] is a 66 y.o. male here with c/o 3 days onset worsening lower back pain, mod to occasionally severe, right > left, sharp, but no worsening LE pain, numb or weakness; worse to stand up, better to sit, nothing else makes better or worse.  Pt denies chest pain, orthopnea, PND, increased LE swelling, palpitations, dizziness or syncope, but has mild intermittent sob/wheezing in the past 2 wks with allergy season ramping up, asks for albuterol hfa prn .  Also not taking aldactone recently.  Also was prescribed ozempic last visit, but did not take, then decided to start 3 days ago when noticed his wt continues to rise with excess calories.   Pt denies polydipsia, polyuria, or new focal neuro s/s.  Denies worsening depressive symptoms, suicidal ideation, or panic ?      ?Wt Readings from Last 3 Encounters:  ?09/13/21 (!) 334 lb 8 oz (151.7 kg)  ?07/12/21 (!) 326 lb (147.9 kg)  ?03/03/21 (!) 314 lb 9.6 oz (142.7 kg)  ? ?BP Readings from Last 3 Encounters:  ?09/13/21 140/88  ?07/12/21 (!) 142/88  ?03/03/21 (!) 180/100  ? ?      ?Past Medical History:  ?Diagnosis Date  ? CAD (coronary artery disease)   ? a. s/p BMS pRCA 2002; b. DES to pRCA & DES p/m RCA 2006; c. PCI/DES OM4 2007; d. PCI/CBA to RCA for ISR 10/2006; e.08/2012 STEMI/Cath/PCI:  LAD 95% >> PCI: Promus Prem DES  //  f. NSTEMI 10/15 >> LHC: mLAD stent ok, dLAD 70, OM1 CTO, OM2 stent ok, dOM2 90, OM3 30-40, dLCx 90, p-mRCA stent ok, mRCA stent ok w/ 60-70 ISR, EF 50% >> med Rx // MV 1/17: no ischemia // Cath ([facility]) 05/2019: RCA 100 (CTO)   ? Combined systolic and diastolic CHF   ? a. Myoview 1/17: EF 35%  //  b. Echo 1/17 EF 45-50% // Echo 05/2019: EF 50-55 // Echocardiogram 8/21: EF 50, inf AK, post and mid inf HK, mod LVH, RVSP 25.3, mild to mod LAE, trivial MR   ? Depression   ? Diabetes mellitus ([place])   ? a. A1c 8.8 08/2012->Metformin initiated. =>  b. A1c (9/14): 6.6  ? Gastroesophageal reflux disease   ? History of nuclear stress test   ? a. Myoview 1/17: EF 35%, fixed inferior lateral defect consistent with infarct, no ischemia, intermediate risk  ? History of pneumonia   ? History of stroke 01/2021  ? Zio Monitor 11/22: NSR, avg HR 61; PVCs (1.9%); no AFib, no high grade HB, no ventricular arrhythmias  ? HTN (hypertension)   ? Hx of NSTEMI   ? Hyperlipidemia   ? Ischemic cardiomyopathy   ? a. EF 40%; improved to normal;  b. 08/2012 Echo: EF 50-55%, mod LVH.//  c. Echo 9/16: Inf HK, mild LVH, EF 55%, mild LAE, normal RVF, mild RAE, PASP 35 mmHg  //  d. Echo 1/17: EF 45-50%, inferior HK, mild BAE, PASP 33 mmHg  ? Obesity   ? OSA (obstructive sleep apnea)   ? Does not use CPAP as of 05/2011  ? Tobacco abuse   ? ?Past Surgical History:  ?Procedure Laterality Date  ? CORONARY ANGIOPLASTY WITH STENT PLACEMENT    ? CORONARY STENT PLACEMENT  2009  ? LEFT HEART CATHETERIZATION WITH CORONARY ANGIOGRAM N/A 08/31/2012  ? Procedure: LEFT HEART CATHETERIZATION  WITH CORONARY ANGIOGRAM;  Surgeon: Burnell Blanks, MD;  Location: [facility] CATH LAB;  Service: Cardiovascular;  Laterality: N/A;  ? LEFT HEART CATHETERIZATION WITH CORONARY ANGIOGRAM N/A 11/16/2013  ? Procedure: LEFT HEART CATHETERIZATION WITH CORONARY ANGIOGRAM;  Surgeon: Blane Ohara, MD;  Location: [facility] CATH LAB;  Service: Cardiovascular;  Laterality: N/A;  ? LEFT HEART CATHETERIZATION WITH CORONARY ANGIOGRAM N/A 03/15/2014  ? Procedure: LEFT HEART CATHETERIZATION WITH CORONARY ANGIOGRAM;  Surgeon: Peter M Martinique, MD;  Location: [facility] CATH LAB;  Service: Cardiovascular;  Laterality: N/A;  ? PERCUTANEOUS CORONARY STENT INTERVENTION (PCI-S) Right 08/31/2012  ? Procedure: PERCUTANEOUS CORONARY STENT INTERVENTION (PCI-S);  Surgeon: Burnell Blanks, MD;  Location: [facility] CATH LAB;  Service: Cardiovascular;  Laterality: Right;  ? PERCUTANEOUS CORONARY STENT INTERVENTION (PCI-S)  11/16/2013  ? Procedure: PERCUTANEOUS  CORONARY STENT INTERVENTION (PCI-S);  Surgeon: Blane Ohara, MD;  Location: [facility] CATH LAB;  Service: Cardiovascular;;  ? ? reports that he has quit smoking. His smoking use included cigarettes. He has a 15.00 pack-year smoking history. He has never used smokeless tobacco. He reports that he does not drink alcohol and does not use drugs. ?family history includes Cancer in his mother; Coronary artery disease in an other family member; Depression in his mother; Hypertension in his mother; Other in his father. ?Allergies  ?Allergen Reactions  ? Penicillin G Other (See Comments)  ?  Did it involve swelling of the face/tongue/throat, SOB, or low BP?  N/A ?Did it involve sudden or severe rash/hives, skin peeling, or any reaction on the inside of your mouth or nose? N/A ?Did you need to seek medical attention at a hospital or doctor's office? N/A ?When did it last happen? Child ?If all above answers are ?NO?, may proceed with cephalosporin use.  ? Penicillins Other (See Comments)  ?  Did it involve swelling of the face/tongue/throat, SOB, or low BP? N/A ?Did it involve sudden or severe rash/hives, skin peeling, or any reaction on the inside of your mouth or nose?N/A ?Did you need to seek medical attention at a hospital or doctor's office? N/A ?When did it last happen? Child      ?If all above answers are ?NO?, may proceed with cephalosporin use.  ? ?Current Outpatient Medications on File Prior to Visit  ?Medication Sig Dispense Refill  ? Alcohol Swabs (B-D SINGLE USE SWABS BUTTERFLY) PADS Use up to 4 times a day to test blood sugars. Dx: E10.9, E11.9 100 each 11  ? aspirin 81 MG EC tablet Take 81 mg by mouth daily.    ? atorvastatin (LIPITOR) 80 MG tablet Take 1 tablet (80 mg total) by mouth at bedtime. 90 tablet 2  ? Blood Glucose Calibration (TRUE METRIX LEVEL 1) Low SOLN Use as needed. Dx: E10.9, E11.9 1 each 0  ? blood glucose meter kit and supplies Dispense per insurance preference. Use as directed once daily. (FOR  ICD-10  E11.9). 1 each 0  ? carvedilol (COREG) 25 MG tablet TAKE 1 TABLET(25 MG) BY MOUTH TWICE DAILY WITH A MEAL 180 tablet 1  ? cetirizine (ZYRTEC) 10 MG tablet TAKE 1 TABLET BY MOUTH DAILY 90 tablet 3  ? cholecalciferol (VITAMIN D3) 25 MCG (1000 UNIT) tablet Take 1,000 Units by mouth daily.    ? clopidogrel (PLAVIX) 75 MG tablet Take 1 tablet (75 mg total) by mouth daily. 90 tablet 3  ? empagliflozin (JARDIANCE) 25 MG TABS tablet Take 1 tablet (25 mg total) by mouth daily before breakfast. 100 tablet 3  ?  gabapentin (NEURONTIN) 300 MG capsule TAKE 1 CAPSULE(300 MG) BY MOUTH THREE TIMES DAILY (Patient taking differently: 300 mg daily.) 270 capsule 1  ? glucose blood (TRUE METRIX BLOOD GLUCOSE TEST) test strip Use to test blood sugars up to 4 times a day. DX: E10.9, E.11.9 100 each 12  ? isosorbide mononitrate (IMDUR) 30 MG 24 hr tablet TAKE 3 TABLETS(90 MG) BY MOUTH DAILY 270 tablet 3  ? Lancets 33G MISC Use to test blood sugars up to 4 times a day. DX: E10.9, E11.9 100 each 6  ? losartan (COZAAR) 100 MG tablet Take 1 tablet (100 mg total) by mouth daily. 100 tablet 3  ? Multiple Vitamin (MULTIVITAMIN) tablet Take 1 tablet by mouth daily.    ? nitroGLYCERIN (NITROSTAT) 0.4 MG SL tablet PLACE 1 TABLET UNDER THE TONGUE EVERY 5 MINUTES AS NEEDED FOR CHEST PAIN 25 tablet 5  ? pantoprazole (PROTONIX) 40 MG tablet Take 1 tablet (40 mg total) by mouth daily. 90 tablet 3  ? PARoxetine (PAXIL) 20 MG tablet TAKE 1 TABLET(20 MG) BY MOUTH DAILY 90 tablet 1  ? potassium chloride (KLOR-CON) 10 MEQ tablet TAKE 2 TABLETS(20 MEQ) BY MOUTH TWICE DAILY 360 tablet 2  ? spironolactone (ALDACTONE) 25 MG tablet TAKE 1/2 TABLET(12.5 MG) BY MOUTH DAILY 45 tablet 3  ? torsemide (DEMADEX) 20 MG tablet Take 1 tablet (20 mg total) by mouth 2 (two) times daily. 270 tablet 3  ? vitamin B-12 (CYANOCOBALAMIN) 1000 MCG tablet Take 1 tablet (1,000 mcg total) by mouth daily. 90 tablet 3  ? glipiZIDE (GLUCOTROL) 5 MG tablet Take 1 tablet (5 mg total)  by mouth 2 (two) times daily. (Patient not taking: Reported on 08/31/2021) 60 tablet 11  ? hydrocortisone cream 1 % Apply to affected area 2 times daily (Patient not taking: Reported on 09/05/2021) 15 g 0

## 2021-09-17 DIAGNOSIS — J45909 Unspecified asthma, uncomplicated: Secondary | ICD-10-CM | POA: Insufficient documentation

## 2021-09-17 NOTE — Assessment & Plan Note (Signed)
Lab Results  ?Component Value Date  ? HGBA1C 7.8 (H) 07/12/2021  ? ?Mild uncontrolled, pt to continue current medical treatment jardiance, glipizide, and just started ozempic 3 days ago ? ?

## 2021-09-17 NOTE — Assessment & Plan Note (Signed)
Mild worsening last few wks  - for restart albuterol hfa prn ?

## 2021-09-17 NOTE — Assessment & Plan Note (Signed)
Last vitamin D ?Lab Results  ?Component Value Date  ? VD25OH 33.85 07/12/2021  ? ?Low, to start oral replacement ? ?

## 2021-09-17 NOTE — Assessment & Plan Note (Signed)
Lab Results  ?Component Value Date  ? [place] 118 (H) 07/12/2021  ? ?uncontrolled, pt to continue current statin lipitor 80 with better compliance, low chol DM diet ? ?

## 2021-09-17 NOTE — Assessment & Plan Note (Signed)
BP Readings from Last 3 Encounters:  ?09/13/21 140/88  ?07/12/21 (!) 142/88  ?03/03/21 (!) 180/100  ? ?Uncontrolled, not taking aldactone but willing to restart, pt to continue medical treatment coreg, losartan, aldactone ? ?

## 2021-09-17 NOTE — Assessment & Plan Note (Signed)
Lab Results  ?Component Value Date  ? CREATININE 1.81 (H) 07/12/2021  ? ?Stable overall, cont to avoid nephrotoxins ? ?

## 2021-09-17 NOTE — Assessment & Plan Note (Signed)
?   Worsening with recent wt gain, pt reqeusts referral sport medicine ?

## 2021-09-18 NOTE — Progress Notes (Deleted)
? ? [name] [name] ?[facility] ?[place] ?Phone: [phone] ?  ?Assessment and Plan:   ?  ?There are no diagnoses linked to this encounter.  ?*** ?  ?Pertinent previous records reviewed include *** ?  ?Follow Up: ***  ? ?  ?Subjective:   ?I, Pincus Badder, am serving as a Education administrator for [employer] ? ?Chief Complaint: midline low back pain  ? ?HPI:  ?09/19/2021 ?Patient is a 66 year old male complaining of low back pain. Patient states ? ?Relevant Historical Information: *** ? ?Additional pertinent review of systems negative. ? ? ?Current Outpatient Medications:  ?  albuterol (VENTOLIN HFA) 108 (90 Base) MCG/ACT inhaler, Inhale 2 puffs into the lungs every 6 (six) hours as needed for wheezing or shortness of breath., Disp: 8 g, Rfl: 11 ?  Alcohol Swabs (B-D SINGLE USE SWABS BUTTERFLY) PADS, Use up to 4 times a day to test blood sugars. Dx: E10.9, E11.9, Disp: 100 each, Rfl: 11 ?  aspirin 81 MG EC tablet, Take 81 mg by mouth daily., Disp: , Rfl:  ?  atorvastatin (LIPITOR) 80 MG tablet, Take 1 tablet (80 mg total) by mouth at bedtime., Disp: 90 tablet, Rfl: 2 ?  Blood Glucose Calibration (TRUE METRIX LEVEL 1) Low SOLN, Use as needed. Dx: E10.9, E11.9, Disp: 1 each, Rfl: 0 ?  blood glucose meter kit and supplies, Dispense per insurance preference. Use as directed once daily. (FOR ICD-10  E11.9)., Disp: 1 each, Rfl: 0 ?  carvedilol (COREG) 25 MG tablet, TAKE 1 TABLET(25 MG) BY MOUTH TWICE DAILY WITH A MEAL, Disp: 180 tablet, Rfl: 1 ?  cetirizine (ZYRTEC) 10 MG tablet, TAKE 1 TABLET BY MOUTH DAILY, Disp: 90 tablet, Rfl: 3 ?  cholecalciferol (VITAMIN D3) 25 MCG (1000 UNIT) tablet, Take 1,000 Units by mouth daily., Disp: , Rfl:  ?  clopidogrel (PLAVIX) 75 MG tablet, Take 1 tablet (75 mg total) by mouth daily., Disp: 90 tablet, Rfl: 3 ?  empagliflozin (JARDIANCE) 25 MG TABS tablet, Take 1 tablet (25 mg total) by mouth daily before breakfast., Disp:  100 tablet, Rfl: 3 ?  gabapentin (NEURONTIN) 300 MG capsule, TAKE 1 CAPSULE(300 MG) BY MOUTH THREE TIMES DAILY (Patient taking differently: 300 mg daily.), Disp: 270 capsule, Rfl: 1 ?  glipiZIDE (GLUCOTROL) 5 MG tablet, Take 1 tablet (5 mg total) by mouth 2 (two) times daily. (Patient not taking: Reported on 08/31/2021), Disp: 60 tablet, Rfl: 11 ?  glucose blood (TRUE METRIX BLOOD GLUCOSE TEST) test strip, Use to test blood sugars up to 4 times a day. DX: E10.9, E.11.9, Disp: 100 each, Rfl: 12 ?  hydrocortisone cream 1 %, Apply to affected area 2 times daily (Patient not taking: Reported on 09/05/2021), Disp: 15 g, Rfl: 0 ?  isosorbide mononitrate (IMDUR) 30 MG 24 hr tablet, TAKE 3 TABLETS(90 MG) BY MOUTH DAILY, Disp: 270 tablet, Rfl: 3 ?  Lancets 33G MISC, Use to test blood sugars up to 4 times a day. DX: E10.9, E11.9, Disp: 100 each, Rfl: 6 ?  losartan (COZAAR) 100 MG tablet, Take 1 tablet (100 mg total) by mouth daily., Disp: 100 tablet, Rfl: 3 ?  Multiple Vitamin (MULTIVITAMIN) tablet, Take 1 tablet by mouth daily., Disp: , Rfl:  ?  nitroGLYCERIN (NITROSTAT) 0.4 MG SL tablet, PLACE 1 TABLET UNDER THE TONGUE EVERY 5 MINUTES AS NEEDED FOR CHEST PAIN, Disp: 25 tablet, Rfl: 5 ?  pantoprazole (PROTONIX) 40 MG tablet, Take 1 tablet (  40 mg total) by mouth daily., Disp: 90 tablet, Rfl: 3 ?  PARoxetine (PAXIL) 20 MG tablet, TAKE 1 TABLET(20 MG) BY MOUTH DAILY, Disp: 90 tablet, Rfl: 1 ?  potassium chloride (KLOR-CON) 10 MEQ tablet, TAKE 2 TABLETS(20 MEQ) BY MOUTH TWICE DAILY, Disp: 360 tablet, Rfl: 2 ?  Semaglutide,0.25 or 0.5MG/DOS, (OZEMPIC, 0.25 OR 0.5 MG/DOSE,) 2 MG/1.5ML SOPN, Inject 0.5 mg into the skin once a week. (Patient not taking: Reported on 08/31/2021), Disp: 4.5 mL, Rfl: 3 ?  spironolactone (ALDACTONE) 25 MG tablet, TAKE 1/2 TABLET(12.5 MG) BY MOUTH DAILY, Disp: 45 tablet, Rfl: 3 ?  torsemide (DEMADEX) 20 MG tablet, Take 1 tablet (20 mg total) by mouth 2 (two) times daily., Disp: 270 tablet, Rfl: 3 ?  traMADol  (ULTRAM) 50 MG tablet, 1 tab by mouth four times per day as needed, Disp: 120 tablet, Rfl: 2 ?  vitamin B-12 (CYANOCOBALAMIN) 1000 MCG tablet, Take 1 tablet (1,000 mcg total) by mouth daily., Disp: 90 tablet, Rfl: 3  ? ?Objective:   ?  ?There were no vitals filed for this visit.  ?  ?There is no height or weight on file to calculate BMI.  ?  ?Physical Exam:   ? ?*** ? ? ?Electronically signed by:  ?[name] [name] ?[facility] ?12:17 PM 09/18/21 ?

## 2021-09-19 ENCOUNTER — Ambulatory Visit (INDEPENDENT_AMBULATORY_CARE_PROVIDER_SITE_OTHER): Payer: Medicare Other | Admitting: *Deleted

## 2021-09-19 ENCOUNTER — Ambulatory Visit: Payer: Medicare Other | Admitting: Sports Medicine

## 2021-09-19 DIAGNOSIS — I5042 Chronic combined systolic (congestive) and diastolic (congestive) heart failure: Secondary | ICD-10-CM

## 2021-09-19 DIAGNOSIS — E1159 Type 2 diabetes mellitus with other circulatory complications: Secondary | ICD-10-CM

## 2021-09-19 NOTE — Patient Instructions (Addendum)
Visit Information ? ?[name], thank you for taking time to talk with me today. Please don't hesitate to contact me if I can be of assistance to you before our next scheduled telephone appointment ? ?As we discussed today-- I have asked your cardiology (heart doctor) provider to provide with you a set of scales when you go to your appointment on Friday, Oct 27, 2021 at 11:20 am-- please attend this appointment as scheduled and ask about the scales that they have in the office to provide to their patients-- please do not forget this ? ?Below are the goals we discussed today:  ?Patient Self-Care Activities: ?Patient [name] [name] will: ?Take medications as prescribed and continue to work with the Pharmacy team at Dr. Gwynn Burly office ?Attend all scheduled provider appointments ?Call pharmacy for medication refills ?Call provider office for new concerns or questions ?Great job checking your blood sugars at home! Keep up the great work! Be sure to write down the blood sugars from home on paper ?Check blood sugars first thing in the morning before eating: the goal for these blood sugars is between 100-120 ?Check blood sugars later during the day 2 hours after eating a regular meal: the goal for these blood sugars is between 160-180 ?Continue to take precautions to prevent falls- use your cane as needed ?Continue to do your best to follow a heart healthy, low salt, low carbohydrate/ low sugar diet ?Please update your personal calendars to reflect your doctor appointments ? ?Our next scheduled telephone follow up visit/ appointment is scheduled on: Wednesday, November 15, 2021 at 11:30 am- This is a PHONE CALL appointment ? ?If you need to cancel or re-schedule our visit, please call [phone] and our care guide team will be happy to assist you. ?  ?I look forward to hearing about your progress. ?  ?[name], RN, BSN, CCRN Alumnus ?[place] ?[phone]: direct office ? ?If  you are experiencing a Mental Health or [place] or need someone to talk to, please  ?call the Suicide and Crisis Lifeline: 988 ?call the [place] National Suicide Prevention Lifeline: (240) 690-1528 or TTY: 608-031-3198 TTY (807) 088-6743) to talk to a trained counselor ?call 1-800-273-TALK (toll free, 24 hour hotline) ?go to [facility] [address] [place] [phone]) ?call 911  ? ?The patient verbalized understanding of instructions, educational materials, and care plan provided today and agreed to receive a mailed copy of patient instructions, educational materials, and care plan ? ?Heart Failure, Self-Care ?Heart failure is a serious condition. The following information explains the things you need to do to take care of yourself after a heart failure diagnosis. You may be asked to change your diet, take certain medicines, and make other lifestyle changes in order to stay as healthy as possible. Your health care provider may also give you more specific instructions. If you have problems or questions, contact your health care provider. ?What are the risks? ?Having heart failure puts you at higher risk for certain problems. These problems can get worse if you do not take good care of yourself. Problems may include: ?Damage to the kidneys, liver, or lungs. ?Malnutrition. ?Abnormal heart rhythms. ?Blood clotting issues that could cause a stroke. ?Supplies needed: ?Scale for monitoring weight. ?Blood pressure monitor. ?Notebook. ?Medicines. ?How to care for yourself when you have heart failure ?Medicines ?Take over-the-counter and prescription medicines only as told by your health care provider. Medicines reduce the workload of your heart, slow  the progression of heart failure, and improve symptoms. Take your medicines every day. ?Do not stop taking your medicine unless your health care provider tells you to do so. ?Do not skip any dose of medicine. ?Refill  your prescriptions before you run out of medicine. ?Talk with your health care provider if you cannot afford your medicines. ?Eating and drinking ? ?Eat heart-healthy foods. Talk with a dietitian to make an eating plan that is right for you. ?Limit salt (sodium) if told by your health care provider. Sodium restriction may reduce symptoms of heart failure. Ask a dietitian to recommend heart-healthy seasonings. ?Use healthy cooking methods instead of frying. Healthy methods include roasting, grilling, broiling, baking, poaching, steaming, and stir-frying. ?Choose foods that contain no trans fat and are low in saturated fat and cholesterol. Healthy choices include fresh or frozen fruits and vegetables, fish, lean meats, legumes, fat-free or low-fat dairy products, and whole-grain or high-fiber foods. ?Limit your fluid intake, if directed by your health care provider. Fluid restriction may reduce symptoms of heart failure. ?Alcohol use ?Do not drink alcohol if: ?Your health care provider tells you not to drink. ?Your heart was damaged by alcohol, or you have severe heart failure. ?You are pregnant, may be pregnant, or are planning to become pregnant. ?If you drink alcohol: ?Limit how much you have to: ?0-1 drink a day for women. ?0-2 drinks a day for men. ?Know how much alcohol is in your drink. In the [place] one drink equals one 12 oz bottle of beer (355 mL), one 5 oz glass of wine (148 mL), or one 1? oz glass of hard liquor (44 mL). ?Lifestyle ? ?Do not use any products that contain nicotine or tobacco. These products include cigarettes, chewing tobacco, and vaping devices, such as e-cigarettes. If you need help quitting, ask your health care provider. ?Do not use nicotine gum or patches before talking to your health care provider. ?Do not use illegal drugs. ?Work with your health care provider to safely reach the right body weight. ?Do physical activity if told by your health care provider. Talk to your health care  provider before you begin an exercise if: ?You are an older adult. ?You have severe heart failure. ?Learn to manage stress. If you need help to do this, ask your health care provider. ?Participate in or seek physical rehabilitation as needed to keep or improve your independence and quality of life. ?Participate in a cardiac rehabilitation program, which is a treatment program to improve your health and well-being through exercise training, education, and counseling. ?Plan rest periods when you get tired. ?Monitoring important information ? ?Weigh yourself every day. This will help you to notice if too much fluid is building up in your body. ?Weigh yourself every morning after you urinate and before you eat breakfast. ?Wear the same amount of clothing each time you weigh yourself. ?Record your daily weight. Provide your health care provider with your weight record. ?Monitor and record your pulse and blood pressure as told by your health care provider. ?Dealing with extreme temperatures ?If the weather is extremely hot: ?Avoid vigorous physical activity. ?Use air conditioning or fans, or find a cooler location. ?Avoid caffeine and alcohol. ?Wear loose-fitting, lightweight, and light-colored clothing. ?If the weather is extremely cold: ?Avoid vigorous activity. ?Layer your clothes. ?Wear mittens or gloves, a hat, and a face covering when you go outside. ?Avoid alcohol. ?Follow these instructions at home: ?Stay up to date with vaccines. Pneumococcal and flu (influenza) vaccines are especially important  in preventing infections of the airways. ?Keep all follow-up visits. This is important. ?Contact a health care provider if you: ?Gain 2-3 lb (1-1.4 kg) in 24 hours or 5 lb (2.3 kg) in a week. ?Have increasing shortness of breath. ?Are unable to participate in your usual physical activities. ?Get tired easily. ?Cough more than normal, especially with physical activity. ?Lose your appetite or feel nauseous. ?Have any  swelling or more swelling in areas such as your hands, feet, ankles, or abdomen. ?Are unable to sleep because it is hard to breathe. ?Feel like your heart is beating quickly (palpitations). ?Become dizzy or lig

## 2021-09-19 NOTE — Chronic Care Management (AMB) (Signed)
?Chronic Care Management  ? ?CCM RN Visit Note ? ?09/19/2021 ?Name: [name] [name] MRN: [id number] DOB: 1955/11/20 ? ?Subjective: ?[name] [name] is a 66 y.o. year old male who is a primary care patient of Biagio Borg, MD. The care management team was consulted for assistance with disease management and care coordination needs.   ? ?Engaged with patient by telephone for follow up visit in response to provider referral for case management and/or care coordination services.  ? ?Consent to Services:  ?The patient was given information about Chronic Care Management services, agreed to services, and gave verbal consent prior to initiation of services.  Please see initial visit note for detailed documentation.  ?Patient agreed to services and verbal consent obtained.  ? ?Assessment: Review of patient past medical history, allergies, medications, health status, including review of consultants reports, laboratory and other test data, was performed as part of comprehensive evaluation and provision of chronic care management services.  ?CCM Care Plan ?Allergies  ?Allergen Reactions  ? Penicillin G Other (See Comments)  ?  Did it involve swelling of the face/tongue/throat, SOB, or low BP?  N/A ?Did it involve sudden or severe rash/hives, skin peeling, or any reaction on the inside of your mouth or nose? N/A ?Did you need to seek medical attention at a hospital or doctor's office? N/A ?When did it last happen? Child ?If all above answers are ?NO?, may proceed with cephalosporin use.  ? Penicillins Other (See Comments)  ?  Did it involve swelling of the face/tongue/throat, SOB, or low BP? N/A ?Did it involve sudden or severe rash/hives, skin peeling, or any reaction on the inside of your mouth or nose?N/A ?Did you need to seek medical attention at a hospital or doctor's office? N/A ?When did it last happen? Child      ?If all above answers are ?NO?, may proceed with cephalosporin use.  ? ?Outpatient Encounter Medications as of  09/19/2021  ?Medication Sig  ? albuterol (VENTOLIN HFA) 108 (90 Base) MCG/ACT inhaler Inhale 2 puffs into the lungs every 6 (six) hours as needed for wheezing or shortness of breath.  ? Alcohol Swabs (B-D SINGLE USE SWABS BUTTERFLY) PADS Use up to 4 times a day to test blood sugars. Dx: E10.9, E11.9  ? aspirin 81 MG EC tablet Take 81 mg by mouth daily.  ? atorvastatin (LIPITOR) 80 MG tablet Take 1 tablet (80 mg total) by mouth at bedtime.  ? Blood Glucose Calibration (TRUE METRIX LEVEL 1) Low SOLN Use as needed. Dx: E10.9, E11.9  ? blood glucose meter kit and supplies Dispense per insurance preference. Use as directed once daily. (FOR ICD-10  E11.9).  ? carvedilol (COREG) 25 MG tablet TAKE 1 TABLET(25 MG) BY MOUTH TWICE DAILY WITH A MEAL  ? cetirizine (ZYRTEC) 10 MG tablet TAKE 1 TABLET BY MOUTH DAILY  ? cholecalciferol (VITAMIN D3) 25 MCG (1000 UNIT) tablet Take 1,000 Units by mouth daily.  ? clopidogrel (PLAVIX) 75 MG tablet Take 1 tablet (75 mg total) by mouth daily.  ? empagliflozin (JARDIANCE) 25 MG TABS tablet Take 1 tablet (25 mg total) by mouth daily before breakfast.  ? gabapentin (NEURONTIN) 300 MG capsule TAKE 1 CAPSULE(300 MG) BY MOUTH THREE TIMES DAILY (Patient taking differently: 300 mg daily.)  ? glipiZIDE (GLUCOTROL) 5 MG tablet Take 1 tablet (5 mg total) by mouth 2 (two) times daily. (Patient not taking: Reported on 08/31/2021)  ? glucose blood (TRUE METRIX BLOOD GLUCOSE TEST) test strip Use to test blood sugars up  to 4 times a day. DX: E10.9, E.11.9  ? hydrocortisone cream 1 % Apply to affected area 2 times daily (Patient not taking: Reported on 09/05/2021)  ? isosorbide mononitrate (IMDUR) 30 MG 24 hr tablet TAKE 3 TABLETS(90 MG) BY MOUTH DAILY  ? Lancets 33G MISC Use to test blood sugars up to 4 times a day. DX: E10.9, E11.9  ? losartan (COZAAR) 100 MG tablet Take 1 tablet (100 mg total) by mouth daily.  ? Multiple Vitamin (MULTIVITAMIN) tablet Take 1 tablet by mouth daily.  ? nitroGLYCERIN  (NITROSTAT) 0.4 MG SL tablet PLACE 1 TABLET UNDER THE TONGUE EVERY 5 MINUTES AS NEEDED FOR CHEST PAIN  ? pantoprazole (PROTONIX) 40 MG tablet Take 1 tablet (40 mg total) by mouth daily.  ? PARoxetine (PAXIL) 20 MG tablet TAKE 1 TABLET(20 MG) BY MOUTH DAILY  ? potassium chloride (KLOR-CON) 10 MEQ tablet TAKE 2 TABLETS(20 MEQ) BY MOUTH TWICE DAILY  ? Semaglutide,0.25 or 0.5MG/DOS, (OZEMPIC, 0.25 OR 0.5 MG/DOSE,) 2 MG/1.5ML SOPN Inject 0.5 mg into the skin once a week. (Patient not taking: Reported on 08/31/2021)  ? spironolactone (ALDACTONE) 25 MG tablet TAKE 1/2 TABLET(12.5 MG) BY MOUTH DAILY  ? torsemide (DEMADEX) 20 MG tablet Take 1 tablet (20 mg total) by mouth 2 (two) times daily.  ? traMADol (ULTRAM) 50 MG tablet 1 tab by mouth four times per day as needed  ? vitamin B-12 (CYANOCOBALAMIN) 1000 MCG tablet Take 1 tablet (1,000 mcg total) by mouth daily.  ? ?No facility-administered encounter medications on file as of 09/19/2021.  ? ?Patient Active Problem List  ? Diagnosis Date Noted  ? Asthma 09/17/2021  ? PVC's (premature ventricular contractions) 09/03/2021  ? SVT (supraventricular tachycardia) ([place]) 09/03/2021  ? History of TIA (transient ischemic attack) 02/05/2021  ? History of stroke 01/2021  ? Aortic atherosclerosis ([place]) 12/26/2020  ? Cough 12/26/2020  ? Vitamin D deficiency 04/03/2020  ? B12 deficiency 04/03/2020  ? Itching 11/22/2019  ? Chronic heart failure with preserved ejection fraction (HFpEF) ([place]) 05/13/2019  ? Elevated troponin 05/13/2019  ? Morbid obesity with BMI of 40.0-44.9, adult ([place]) 05/13/2019  ? Pre-ulcerative calluses 02/03/2019  ? Foot injury, left, initial encounter 11/26/2018  ? Chest pain 05/30/2017  ? Hypokalemia 05/30/2017  ? Allergic rhinitis 12/18/2016  ? Lumbar radiculitis 09/05/2016  ? Lumbar spinal stenosis 09/05/2016  ? CKD (chronic kidney disease), stage III ([place]) 12/29/2015  ? Chest pain syndrome 12/15/2015  ? Acute kidney injury ([place]) 12/15/2015  ? Atypical chest pain  12/15/2015  ? Encounter for well adult exam with abnormal findings 06/27/2015  ? Chronic low back pain 06/27/2015  ? Left lumbar radiculopathy 04/13/2015  ? Bilateral plantar fasciitis 04/13/2015  ? Essential hypertension 03/09/2015  ? OSA (obstructive sleep apnea) 03/27/2014  ? Peripheral neuropathy ([place]) 12/29/2013  ? External hemorrhoid, thrombosed 12/03/2013  ? Sinus bradycardia 11/15/2013  ? Coronary artery disease involving native coronary artery of native heart without angina pectoris   ? Hyperlipidemia   ? DM2 (diabetes mellitus, type 2) ([place]) 06/26/2013  ? Gait disorder 06/26/2013  ? Obesity, Class III, BMI 40-49.9 (morbid obesity) ([place]) 09/10/2012  ? Former smoker 09/02/2012  ? Ischemic cardiomyopathy- EF 50-55% June 2015   ? Old MI (myocardial infarction) 09/01/2012  ? HEMORRHOIDS 12/26/2009  ? Acute prostatitis 12/26/2009  ? GANGLION CYST, WRIST, RIGHT 11/28/2009  ? COLONIC POLYPS 10/07/2009  ? Anxiety with depression 07/21/2009  ? DYSPNEA 03/24/2009  ? GASTROESOPHAGEAL REFLUX DISEASE 01/27/2007  ? PERCUTANEOUS TRANSLUMINAL CORONARY ANGIOPLASTY, HX  OF 02/09/2006  ? ?Conditions to be addressed/monitored:  CHF and DMII ? ?Care Plan : RN Care Manager Plan of Care  ?Updates made by Knox Royalty, RN since 09/19/2021 12:00 AM  ?  ? ?Problem: Chronic Disease Management Needs   ?Priority: High  ?  ? ?Long-Range Goal: Ongoing adherence to established plan of care for long term chronic disease management   ?Start Date: 01/25/2021  ?Expected End Date: 01/25/2022  ?Priority: High  ?Note:   ?Current Barriers:  ?Chronic Disease Management support and education needs related to CHF and DMII ?Literacy barriers- patient reports needs assistance understanding health care advice ?08/31/21: confirms he has a friend who will assist with reading post-office visit instructions- will continue to mail AVS ?05/29/21-- Has still not obtained glucometer/ scales from insurance provider: confirmed patient is working with [facility]  team for prescription needs: he reports he is "waiting" to receive glucometer after recent CCM pharmacy visit 05/08/21 at which time, CCM pharmacist requested new order/ prescription for glucometer ?08/31/21: report

## 2021-09-20 ENCOUNTER — Ambulatory Visit (INDEPENDENT_AMBULATORY_CARE_PROVIDER_SITE_OTHER): Payer: Medicare Other

## 2021-09-20 DIAGNOSIS — Z0000 Encounter for general adult medical examination without abnormal findings: Secondary | ICD-10-CM

## 2021-09-20 NOTE — Progress Notes (Signed)
?I connected with [name] [name] today by telephone and verified that I am speaking with the correct person using two identifiers. ?Location patient: home ?Location provider: work ?Persons participating in the virtual visit: patient, provider. ?  ?I discussed the limitations, risks, security and privacy concerns of performing an evaluation and management service by telephone and the availability of in person appointments. I also discussed with the patient that there may be a patient responsible charge related to this service. The patient expressed understanding and verbally consented to this telephonic visit.  ?  ?Interactive audio and video telecommunications were attempted between this provider and patient, however failed, due to patient having technical difficulties OR patient did not have access to video capability.  We continued and completed visit with audio only. ? ?Some vital signs may be absent or patient reported.  ? ?Time Spent with patient on telephone encounter: 40 minutes ? ?Subjective:  ? [name] [name] is a 66 y.o. male who presents for Medicare Annual/Subsequent preventive examination. ? ?Review of Systems    ? ?Cardiac Risk Factors include: advanced age (>65men, >64 women);diabetes mellitus;dyslipidemia;family history of premature cardiovascular disease;hypertension;male gender;obesity (BMI >30kg/m2);sedentary lifestyle ? ?   ?Objective:  ?  ?Today's Vitals  ? 09/20/21 1625  ?PainSc: 8   ? ?There is no height or weight on file to calculate BMI. ? ? ?  09/20/2021  ?  4:06 PM 02/05/2021  ? 10:05 AM 09/09/2020  ? 11:55 AM 08/25/2019  ?  4:07 PM 05/30/2017  ?  7:54 PM 05/29/2017  ?  3:29 PM 03/29/2017  ?  8:42 AM  ?Advanced Directives  ?Does Patient Have a Medical Advance Directive? No No No No No No No  ?Would patient like information on creating a medical advance directive? No - Patient declined No - Patient declined No - Patient declined No - Patient declined No - Patient declined No - Patient declined  Yes (ED - Information included in AVS)  ? ? ?Current Medications (verified) ?Outpatient Encounter Medications as of 09/20/2021  ?Medication Sig  ? albuterol (VENTOLIN HFA) 108 (90 Base) MCG/ACT inhaler Inhale 2 puffs into the lungs every 6 (six) hours as needed for wheezing or shortness of breath.  ? Alcohol Swabs (B-D SINGLE USE SWABS BUTTERFLY) PADS Use up to 4 times a day to test blood sugars. Dx: E10.9, E11.9  ? aspirin 81 MG EC tablet Take 81 mg by mouth daily.  ? atorvastatin (LIPITOR) 80 MG tablet Take 1 tablet (80 mg total) by mouth at bedtime.  ? Blood Glucose Calibration (TRUE METRIX LEVEL 1) Low SOLN Use as needed. Dx: E10.9, E11.9  ? blood glucose meter kit and supplies Dispense per insurance preference. Use as directed once daily. (FOR ICD-10  E11.9).  ? carvedilol (COREG) 25 MG tablet TAKE 1 TABLET(25 MG) BY MOUTH TWICE DAILY WITH A MEAL  ? cetirizine (ZYRTEC) 10 MG tablet TAKE 1 TABLET BY MOUTH DAILY  ? cholecalciferol (VITAMIN D3) 25 MCG (1000 UNIT) tablet Take 1,000 Units by mouth daily.  ? clopidogrel (PLAVIX) 75 MG tablet Take 1 tablet (75 mg total) by mouth daily.  ? empagliflozin (JARDIANCE) 25 MG TABS tablet Take 1 tablet (25 mg total) by mouth daily before breakfast.  ? gabapentin (NEURONTIN) 300 MG capsule TAKE 1 CAPSULE(300 MG) BY MOUTH THREE TIMES DAILY (Patient taking differently: 300 mg daily.)  ? glipiZIDE (GLUCOTROL) 5 MG tablet Take 1 tablet (5 mg total) by mouth 2 (two) times daily. (Patient not taking: Reported on 08/31/2021)  ?  glucose blood (TRUE METRIX BLOOD GLUCOSE TEST) test strip Use to test blood sugars up to 4 times a day. DX: E10.9, E.11.9  ? hydrocortisone cream 1 % Apply to affected area 2 times daily (Patient not taking: Reported on 09/05/2021)  ? isosorbide mononitrate (IMDUR) 30 MG 24 hr tablet TAKE 3 TABLETS(90 MG) BY MOUTH DAILY  ? Lancets 33G MISC Use to test blood sugars up to 4 times a day. DX: E10.9, E11.9  ? losartan (COZAAR) 100 MG tablet Take 1 tablet (100 mg  total) by mouth daily.  ? Multiple Vitamin (MULTIVITAMIN) tablet Take 1 tablet by mouth daily.  ? nitroGLYCERIN (NITROSTAT) 0.4 MG SL tablet PLACE 1 TABLET UNDER THE TONGUE EVERY 5 MINUTES AS NEEDED FOR CHEST PAIN  ? pantoprazole (PROTONIX) 40 MG tablet Take 1 tablet (40 mg total) by mouth daily.  ? PARoxetine (PAXIL) 20 MG tablet TAKE 1 TABLET(20 MG) BY MOUTH DAILY  ? potassium chloride (KLOR-CON) 10 MEQ tablet TAKE 2 TABLETS(20 MEQ) BY MOUTH TWICE DAILY  ? Semaglutide,0.25 or 0.5MG /DOS, (OZEMPIC, 0.25 OR 0.5 MG/DOSE,) 2 MG/1.5ML SOPN Inject 0.5 mg into the skin once a week. (Patient not taking: Reported on 08/31/2021)  ? spironolactone (ALDACTONE) 25 MG tablet TAKE 1/2 TABLET(12.5 MG) BY MOUTH DAILY  ? torsemide (DEMADEX) 20 MG tablet Take 1 tablet (20 mg total) by mouth 2 (two) times daily.  ? traMADol (ULTRAM) 50 MG tablet 1 tab by mouth four times per day as needed  ? vitamin B-12 (CYANOCOBALAMIN) 1000 MCG tablet Take 1 tablet (1,000 mcg total) by mouth daily.  ? ?No facility-administered encounter medications on file as of 09/20/2021.  ? ? ?Allergies (verified) ?Penicillin g and Penicillins  ? ?History: ?Past Medical History:  ?Diagnosis Date  ? CAD (coronary artery disease)   ? a. s/p BMS pRCA 2002; b. DES to pRCA & DES p/m RCA 2006; c. PCI/DES OM4 2007; d. PCI/CBA to RCA for ISR 10/2006; e.08/2012 STEMI/Cath/PCI:  LAD 95% >> PCI: Promus Prem DES  //  f. NSTEMI 10/15 >> LHC: mLAD stent ok, dLAD 70, OM1 CTO, OM2 stent ok, dOM2 90, OM3 30-40, dLCx 90, p-mRCA stent ok, mRCA stent ok w/ 60-70 ISR, EF 50% >> med Rx // MV 1/17: no ischemia // Cath ([facility]) 05/2019: RCA 100 (CTO)   ? Combined systolic and diastolic CHF   ? a. Myoview 1/17: EF 35%  //  b. Echo 1/17 EF 45-50% // Echo 05/2019: EF 50-55 // Echocardiogram 8/21: EF 50, inf AK, post and mid inf HK, mod LVH, RVSP 25.3, mild to mod LAE, trivial MR   ? Depression   ? Diabetes mellitus ([place])   ? a. A1c 8.8 08/2012->Metformin initiated. => b. A1c (9/14): 6.6  ?  Gastroesophageal reflux disease   ? History of nuclear stress test   ? a. Myoview 1/17: EF 35%, fixed inferior lateral defect consistent with infarct, no ischemia, intermediate risk  ? History of pneumonia   ? History of stroke 01/2021  ? Zio Monitor 11/22: NSR, avg HR 61; PVCs (1.9%); no AFib, no high grade HB, no ventricular arrhythmias  ? HTN (hypertension)   ? Hx of NSTEMI   ? Hyperlipidemia   ? Ischemic cardiomyopathy   ? a. EF 40%; improved to normal;  b. 08/2012 Echo: EF 50-55%, mod LVH.//  c. Echo 9/16: Inf HK, mild LVH, EF 55%, mild LAE, normal RVF, mild RAE, PASP 35 mmHg  //  d. Echo 1/17: EF 45-50%, inferior HK, mild BAE, PASP  33 mmHg  ? Obesity   ? OSA (obstructive sleep apnea)   ? Does not use CPAP as of 05/2011  ? Tobacco abuse   ? ?Past Surgical History:  ?Procedure Laterality Date  ? CORONARY ANGIOPLASTY WITH STENT PLACEMENT    ? CORONARY STENT PLACEMENT  2009  ? LEFT HEART CATHETERIZATION WITH CORONARY ANGIOGRAM N/A 08/31/2012  ? Procedure: LEFT HEART CATHETERIZATION WITH CORONARY ANGIOGRAM;  Surgeon: Burnell Blanks, MD;  Location: [facility] CATH LAB;  Service: Cardiovascular;  Laterality: N/A;  ? LEFT HEART CATHETERIZATION WITH CORONARY ANGIOGRAM N/A 11/16/2013  ? Procedure: LEFT HEART CATHETERIZATION WITH CORONARY ANGIOGRAM;  Surgeon: Blane Ohara, MD;  Location: [facility] CATH LAB;  Service: Cardiovascular;  Laterality: N/A;  ? LEFT HEART CATHETERIZATION WITH CORONARY ANGIOGRAM N/A 03/15/2014  ? Procedure: LEFT HEART CATHETERIZATION WITH CORONARY ANGIOGRAM;  Surgeon: Peter M Martinique, MD;  Location: [facility] CATH LAB;  Service: Cardiovascular;  Laterality: N/A;  ? PERCUTANEOUS CORONARY STENT INTERVENTION (PCI-S) Right 08/31/2012  ? Procedure: PERCUTANEOUS CORONARY STENT INTERVENTION (PCI-S);  Surgeon: Burnell Blanks, MD;  Location: [facility] CATH LAB;  Service: Cardiovascular;  Laterality: Right;  ? PERCUTANEOUS CORONARY STENT INTERVENTION (PCI-S)  11/16/2013  ? Procedure: PERCUTANEOUS CORONARY STENT INTERVENTION  (PCI-S);  Surgeon: Blane Ohara, MD;  Location: [facility] CATH LAB;  Service: Cardiovascular;;  ? ?Family History  ?Problem Relation Age of Onset  ? Depression Mother   ? Cancer Mother   ?     ovarian  ? Hypertens

## 2021-09-20 NOTE — Patient Instructions (Signed)
[name] , ?Thank you for taking time to come for your Medicare Wellness Visit. I appreciate your ongoing commitment to your health goals. Please review the following plan we discussed and let me know if I can assist you in the future.  ? ?Screening recommendations/referrals: ?Colonoscopy: 03/17/2012; due 03/17/2022 ?Recommended yearly ophthalmology/optometry visit for glaucoma screening and checkup ?Recommended yearly dental visit for hygiene and checkup ? ?Vaccinations: ?Influenza vaccine: 07/12/2021; due every Fall season ?Pneumococcal vaccine: 06/26/2013, 06/27/2015 ?Tdap vaccine: overdue ?Shingles vaccine: never done; can check with local [facility]   ?Covid-19: 11/03/2019, 12/01/2019, 06/20/2020 ? ?Advanced directives: No ? ?Conditions/risks identified: Yes ? ?Next appointment: Please schedule your next Medicare Wellness Visit with your Nurse Health Advisor in 1 year by calling [phone]. ? ?Preventive Care 40-64 Years, Male ?Preventive care refers to lifestyle choices and visits with your health care provider that can promote health and wellness. ?What does preventive care include? ?A yearly physical exam. This is also called an annual well check. ?Dental exams once or twice a year. ?Routine eye exams. Ask your health care provider how often you should have your eyes checked. ?Personal lifestyle choices, including: ?Daily care of your teeth and gums. ?Regular physical activity. ?Eating a healthy diet. ?Avoiding tobacco and drug use. ?Limiting alcohol use. ?Practicing safe sex. ?Taking low-dose aspirin every day starting at age 36. ?What happens during an annual well check? ?The services and screenings done by your health care provider during your annual well check will depend on your age, overall health, lifestyle risk factors, and family history of disease. ?Counseling  ?Your health care provider may ask you questions about your: ?Alcohol use. ?Tobacco use. ?Drug use. ?Emotional well-being. ?Home and  relationship well-being. ?Sexual activity. ?Eating habits. ?Work and work Statistician. ?Screening  ?You may have the following tests or measurements: ?Height, weight, and BMI. ?Blood pressure. ?Lipid and cholesterol levels. These may be checked every 5 years, or more frequently if you are over 81 years old. ?Skin check. ?Lung cancer screening. You may have this screening every year starting at age 3 if you have a 30-pack-year history of smoking and currently smoke or have quit within the past 15 years. ?Fecal occult blood test (FOBT) of the stool. You may have this test every year starting at age 56. ?Flexible sigmoidoscopy or colonoscopy. You may have a sigmoidoscopy every 5 years or a colonoscopy every 10 years starting at age 26. ?Prostate cancer screening. Recommendations will vary depending on your family history and other risks. ?Hepatitis C blood test. ?Hepatitis B blood test. ?Sexually transmitted disease (STD) testing. ?Diabetes screening. This is done by checking your blood sugar (glucose) after you have not eaten for a while (fasting). You may have this done every 1-3 years. ?Discuss your test results, treatment options, and if necessary, the need for more tests with your health care provider. ?Vaccines  ?Your health care provider may recommend certain vaccines, such as: ?Influenza vaccine. This is recommended every year. ?Tetanus, diphtheria, and acellular pertussis (Tdap, Td) vaccine. You may need a Td booster every 10 years. ?Zoster vaccine. You may need this after age 64. ?Pneumococcal 13-valent conjugate (PCV13) vaccine. You may need this if you have certain conditions and have not been vaccinated. ?Pneumococcal polysaccharide (PPSV23) vaccine. You may need one or two doses if you smoke cigarettes or if you have certain conditions. ?Talk to your health care provider about which screenings and vaccines you need and how often you need them. ?This information is not intended to replace  advice given to  you by your health care provider. Make sure you discuss any questions you have with your health care provider. ?Document Released: 06/24/2015 Document Revised: 02/15/2016 Document Reviewed: 03/29/2015 ?Elsevier Interactive Patient Education ? 2017 [employer]. ? ?Fall Prevention in the Home ?Falls can cause injuries. They can happen to people of all ages. There are many things you can do to make your home safe and to help prevent falls. ?What can I do on the outside of my home? ?Regularly fix the edges of walkways and driveways and fix any cracks. ?Remove anything that might make you trip as you walk through a door, such as a raised step or threshold. ?Trim any bushes or trees on the path to your home. ?Use bright outdoor lighting. ?Clear any walking paths of anything that might make someone trip, such as rocks or tools. ?Regularly check to see if handrails are loose or broken. Make sure that both sides of any steps have handrails. ?Any raised decks and porches should have guardrails on the edges. ?Have any leaves, snow, or ice cleared regularly. ?Use sand or salt on walking paths during winter. ?Clean up any spills in your garage right away. This includes oil or grease spills. ?What can I do in the bathroom? ?Use night lights. ?Install grab bars by the toilet and in the tub and shower. Do not use towel bars as grab bars. ?Use non-skid mats or decals in the tub or shower. ?If you need to sit down in the shower, use a plastic, non-slip stool. ?Keep the floor dry. Clean up any water that spills on the floor as soon as it happens. ?Remove soap buildup in the tub or shower regularly. ?Attach bath mats securely with double-sided non-slip rug tape. ?Do not have throw rugs and other things on the floor that can make you trip. ?What can I do in the bedroom? ?Use night lights. ?Make sure that you have a light by your bed that is easy to reach. ?Do not use any sheets or blankets that are too big for your bed. They should not  hang down onto the floor. ?Have a firm chair that has side arms. You can use this for support while you get dressed. ?Do not have throw rugs and other things on the floor that can make you trip. ?What can I do in the kitchen? ?Clean up any spills right away. ?Avoid walking on wet floors. ?Keep items that you use a lot in easy-to-reach places. ?If you need to reach something above you, use a strong step stool that has a grab bar. ?Keep electrical cords out of the way. ?Do not use floor polish or wax that makes floors slippery. If you must use wax, use non-skid floor wax. ?Do not have throw rugs and other things on the floor that can make you trip. ?What can I do with my stairs? ?Do not leave any items on the stairs. ?Make sure that there are handrails on both sides of the stairs and use them. Fix handrails that are broken or loose. Make sure that handrails are as long as the stairways. ?Check any carpeting to make sure that it is firmly attached to the stairs. Fix any carpet that is loose or worn. ?Avoid having throw rugs at the top or bottom of the stairs. If you do have throw rugs, attach them to the floor with carpet tape. ?Make sure that you have a light switch at the top of the stairs and the bottom  of the stairs. If you do not have them, ask someone to add them for you. ?What else can I do to help prevent falls? ?Wear shoes that: ?Do not have high heels. ?Have rubber bottoms. ?Are comfortable and fit you well. ?Are closed at the toe. Do not wear sandals. ?If you use a stepladder: ?Make sure that it is fully opened. Do not climb a closed stepladder. ?Make sure that both sides of the stepladder are locked into place. ?Ask someone to hold it for you, if possible. ?Clearly mark and make sure that you can see: ?Any grab bars or handrails. ?First and last steps. ?Where the edge of each step is. ?Use tools that help you move around (mobility aids) if they are needed. These  include: ?Canes. ?Walkers. ?Scooters. ?Crutches. ?Turn on the lights when you go into a dark area. Replace any light bulbs as soon as they burn out. ?Set up your furniture so you have a clear path. Avoid moving your furniture around. ?If any of your flo

## 2021-09-21 NOTE — Progress Notes (Deleted)
? ? [name] [name] D.Merril Abbe ?[facility] ?[place] ?Phone: [phone] ?  ?Assessment and Plan:   ?  ?There are no diagnoses linked to this encounter.  ?*** ?  ?Pertinent previous records reviewed include *** ?  ?Follow Up: ***  ? ?  ?Subjective:   ?I, [name] [name], am serving as a Education administrator for [employer] ? ?Chief Complaint: back pain  ? ?HPI:  ? ?09/22/2021 ?Patient is a 66 year old male complaining of back pain. Patient states ? ?Relevant Historical Information: *** ? ?Additional pertinent review of systems negative. ? ? ?Current Outpatient Medications:  ?  albuterol (VENTOLIN HFA) 108 (90 Base) MCG/ACT inhaler, Inhale 2 puffs into the lungs every 6 (six) hours as needed for wheezing or shortness of breath., Disp: 8 g, Rfl: 11 ?  Alcohol Swabs (B-D SINGLE USE SWABS BUTTERFLY) PADS, Use up to 4 times a day to test blood sugars. Dx: E10.9, E11.9, Disp: 100 each, Rfl: 11 ?  aspirin 81 MG EC tablet, Take 81 mg by mouth daily., Disp: , Rfl:  ?  atorvastatin (LIPITOR) 80 MG tablet, Take 1 tablet (80 mg total) by mouth at bedtime., Disp: 90 tablet, Rfl: 2 ?  Blood Glucose Calibration (TRUE METRIX LEVEL 1) Low SOLN, Use as needed. Dx: E10.9, E11.9, Disp: 1 each, Rfl: 0 ?  blood glucose meter kit and supplies, Dispense per insurance preference. Use as directed once daily. (FOR ICD-10  E11.9)., Disp: 1 each, Rfl: 0 ?  carvedilol (COREG) 25 MG tablet, TAKE 1 TABLET(25 MG) BY MOUTH TWICE DAILY WITH A MEAL, Disp: 180 tablet, Rfl: 1 ?  cetirizine (ZYRTEC) 10 MG tablet, TAKE 1 TABLET BY MOUTH DAILY, Disp: 90 tablet, Rfl: 3 ?  cholecalciferol (VITAMIN D3) 25 MCG (1000 UNIT) tablet, Take 1,000 Units by mouth daily., Disp: , Rfl:  ?  clopidogrel (PLAVIX) 75 MG tablet, Take 1 tablet (75 mg total) by mouth daily., Disp: 90 tablet, Rfl: 3 ?  empagliflozin (JARDIANCE) 25 MG TABS tablet, Take 1 tablet (25 mg total) by mouth daily before breakfast., Disp: 100 tablet,  Rfl: 3 ?  gabapentin (NEURONTIN) 300 MG capsule, TAKE 1 CAPSULE(300 MG) BY MOUTH THREE TIMES DAILY (Patient taking differently: 300 mg daily.), Disp: 270 capsule, Rfl: 1 ?  glipiZIDE (GLUCOTROL) 5 MG tablet, Take 1 tablet (5 mg total) by mouth 2 (two) times daily. (Patient not taking: Reported on 08/31/2021), Disp: 60 tablet, Rfl: 11 ?  glucose blood (TRUE METRIX BLOOD GLUCOSE TEST) test strip, Use to test blood sugars up to 4 times a day. DX: E10.9, E.11.9, Disp: 100 each, Rfl: 12 ?  hydrocortisone cream 1 %, Apply to affected area 2 times daily (Patient not taking: Reported on 09/05/2021), Disp: 15 g, Rfl: 0 ?  isosorbide mononitrate (IMDUR) 30 MG 24 hr tablet, TAKE 3 TABLETS(90 MG) BY MOUTH DAILY, Disp: 270 tablet, Rfl: 3 ?  Lancets 33G MISC, Use to test blood sugars up to 4 times a day. DX: E10.9, E11.9, Disp: 100 each, Rfl: 6 ?  losartan (COZAAR) 100 MG tablet, Take 1 tablet (100 mg total) by mouth daily., Disp: 100 tablet, Rfl: 3 ?  Multiple Vitamin (MULTIVITAMIN) tablet, Take 1 tablet by mouth daily., Disp: , Rfl:  ?  nitroGLYCERIN (NITROSTAT) 0.4 MG SL tablet, PLACE 1 TABLET UNDER THE TONGUE EVERY 5 MINUTES AS NEEDED FOR CHEST PAIN, Disp: 25 tablet, Rfl: 5 ?  pantoprazole (PROTONIX) 40 MG tablet, Take 1 tablet (40 mg  total) by mouth daily., Disp: 90 tablet, Rfl: 3 ?  PARoxetine (PAXIL) 20 MG tablet, TAKE 1 TABLET(20 MG) BY MOUTH DAILY, Disp: 90 tablet, Rfl: 1 ?  potassium chloride (KLOR-CON) 10 MEQ tablet, TAKE 2 TABLETS(20 MEQ) BY MOUTH TWICE DAILY, Disp: 360 tablet, Rfl: 2 ?  Semaglutide,0.25 or 0.5MG /DOS, (OZEMPIC, 0.25 OR 0.5 MG/DOSE,) 2 MG/1.5ML SOPN, Inject 0.5 mg into the skin once a week. (Patient not taking: Reported on 08/31/2021), Disp: 4.5 mL, Rfl: 3 ?  spironolactone (ALDACTONE) 25 MG tablet, TAKE 1/2 TABLET(12.5 MG) BY MOUTH DAILY, Disp: 45 tablet, Rfl: 3 ?  torsemide (DEMADEX) 20 MG tablet, Take 1 tablet (20 mg total) by mouth 2 (two) times daily., Disp: 270 tablet, Rfl: 3 ?  traMADol (ULTRAM) 50  MG tablet, 1 tab by mouth four times per day as needed, Disp: 120 tablet, Rfl: 2 ?  vitamin B-12 (CYANOCOBALAMIN) 1000 MCG tablet, Take 1 tablet (1,000 mcg total) by mouth daily., Disp: 90 tablet, Rfl: 3  ? ?Objective:   ?  ?There were no vitals filed for this visit.  ?  ?There is no height or weight on file to calculate BMI.  ?  ?Physical Exam:   ? ?*** ? ? ?Electronically signed by:  ?[name] [name] D.Merril Abbe ?[facility] ?10:43 AM 09/21/21 ?

## 2021-09-22 ENCOUNTER — Ambulatory Visit: Payer: Medicare Other | Admitting: Sports Medicine

## 2021-09-27 ENCOUNTER — Ambulatory Visit (INDEPENDENT_AMBULATORY_CARE_PROVIDER_SITE_OTHER): Payer: Medicare Other | Admitting: Podiatry

## 2021-09-27 ENCOUNTER — Encounter: Payer: Self-pay | Admitting: Podiatry

## 2021-09-27 DIAGNOSIS — M79674 Pain in right toe(s): Secondary | ICD-10-CM | POA: Diagnosis not present

## 2021-09-27 DIAGNOSIS — E1142 Type 2 diabetes mellitus with diabetic polyneuropathy: Secondary | ICD-10-CM | POA: Diagnosis not present

## 2021-09-27 DIAGNOSIS — M2040 Other hammer toe(s) (acquired), unspecified foot: Secondary | ICD-10-CM | POA: Diagnosis not present

## 2021-09-27 DIAGNOSIS — M79675 Pain in left toe(s): Secondary | ICD-10-CM | POA: Diagnosis not present

## 2021-09-27 DIAGNOSIS — E1159 Type 2 diabetes mellitus with other circulatory complications: Secondary | ICD-10-CM | POA: Diagnosis not present

## 2021-09-27 DIAGNOSIS — L84 Corns and callosities: Secondary | ICD-10-CM

## 2021-09-27 DIAGNOSIS — B351 Tinea unguium: Secondary | ICD-10-CM | POA: Diagnosis not present

## 2021-09-27 DIAGNOSIS — N1831 Chronic kidney disease, stage 3a: Secondary | ICD-10-CM | POA: Diagnosis not present

## 2021-09-27 NOTE — Progress Notes (Signed)
This patient returns to my office for at risk foot care.  This patient requires this care by a professional since this patient will be at risk due to having pre-ulcerative calluses, CKD and diabetic neuropathy and coagulation defect.  Patient is taking plavix.  This patient is unable to cut nails himself since the patient cannot reach his nails.These nails are painful walking and wearing shoes.  This patient presents for at risk foot care today.   ? ?General Appearance  Alert, conversant and in no acute stress. ? ?Vascular  Dorsalis pedis and posterior tibial  pulses are  weakly  palpable  bilaterally.  Capillary return is within normal limits  bilaterally. Temperature is within normal limits  bilaterally. ? ?Neurologic  Senn-Weinstein monofilament wire test diminished   bilaterally. Muscle power within normal limits bilaterally. ? ?Nails Thick disfigured discolored nails with subungual debris  from hallux to fifth toes bilaterally. No evidence of bacterial infection or drainage bilaterally. ? ?Orthopedic  No limitations of motion  feet .  No crepitus or effusions noted.  No bony pathology or digital deformities noted.  Plantar flexed second metatarsal  B/L. ? ?Skin  normotropic skin  noted bilaterally.  No signs of infections or ulcers noted.   Pre-ulcerous callus sub 2  B/L.  Porokeratosis sub 5th  Left. ? ?Onychomycosis  Pain in right toes  Pain in left toes  Porokeratosis/Pre-ulcerous callus  B/L. ? ?Consent was obtained for treatment procedures.   Mechanical debridement of nails 1-5  bilaterally performed with a nail nipper.  Filed with dremel without incident. Porokeratosis debrided with # 15 blade. Patient qualifies for diabetic shoes due to DPN, preulcerous callus and hammer toes. ? ? ?Return office visit     3 months                Told patient to return for periodic foot care and evaluation due to potential at risk complications. ? ? ?Gardiner Barefoot DPM  ?

## 2021-09-30 ENCOUNTER — Emergency Department (HOSPITAL_BASED_OUTPATIENT_CLINIC_OR_DEPARTMENT_OTHER)
Admission: EM | Admit: 2021-09-30 | Discharge: 2021-09-30 | Disposition: A | Discharge status: Home / Self Care | Payer: Medicare Other | Attending: Emergency Medicine | Admitting: Emergency Medicine

## 2021-09-30 ENCOUNTER — Encounter (HOSPITAL_BASED_OUTPATIENT_CLINIC_OR_DEPARTMENT_OTHER): Payer: Self-pay | Admitting: Emergency Medicine

## 2021-09-30 ENCOUNTER — Other Ambulatory Visit: Payer: Self-pay

## 2021-09-30 DIAGNOSIS — Z7982 Long term (current) use of aspirin: Secondary | ICD-10-CM | POA: Diagnosis not present

## 2021-09-30 DIAGNOSIS — X58XXXA Exposure to other specified factors, initial encounter: Secondary | ICD-10-CM | POA: Insufficient documentation

## 2021-09-30 DIAGNOSIS — T161XXA Foreign body in right ear, initial encounter: Secondary | ICD-10-CM | POA: Diagnosis not present

## 2021-09-30 MED ORDER — LIDOCAINE HCL (PF) 1 % IJ SOLN
5.0000 mL | Freq: Once | INTRAMUSCULAR | Status: AC
  Administered 2021-09-30: 5 mL
  Filled 2021-09-30: qty 5

## 2021-09-30 NOTE — ED Triage Notes (Signed)
Sensation of something moving in R ear starting 3 am this AM. Has tried sticking his keys in his ear without success.  ?

## 2021-09-30 NOTE — ED Provider Notes (Signed)
?[name] ?Provider Note ? ? ?CSN: [id number] ?Arrival date & time: 09/30/21  0620 ? ?  ? ?History ? ?Chief Complaint  ?Patient presents with  ? Foreign Body in [place]  ? ? ?[name] [name] is a 66 y.o. male presenting to ED with sensation of something wiggling in right ear, onset last night. ? ?HPI ? ?  ? ?Home Medications ?Prior to Admission medications   ?Medication Sig Start Date End Date Taking? Authorizing Provider  ?albuterol (VENTOLIN HFA) 108 (90 Base) MCG/ACT inhaler Inhale 2 puffs into the lungs every 6 (six) hours as needed for wheezing or shortness of breath. 09/13/21   Biagio Borg, MD  ?Alcohol Swabs (B-D SINGLE USE SWABS BUTTERFLY) PADS Use up to 4 times a day to test blood sugars. Dx: E10.9, E11.9 09/30/20   Biagio Borg, MD  ?aspirin 81 MG EC tablet Take 81 mg by mouth daily. 02/06/21   [provider]  ?atorvastatin (LIPITOR) 80 MG tablet Take 1 tablet (80 mg total) by mouth at bedtime. 07/24/21   Sherren Mocha, MD  ?Blood Glucose Calibration (TRUE METRIX LEVEL 1) Low SOLN Use as needed. Dx: E10.9, E11.9 01/12/21   Biagio Borg, MD  ?blood glucose meter kit and supplies Dispense per insurance preference. Use as directed once daily. (FOR ICD-10  E11.9). 07/04/21   Biagio Borg, MD  ?carvedilol (COREG) 25 MG tablet TAKE 1 TABLET(25 MG) BY MOUTH TWICE DAILY WITH A MEAL 04/18/21   Sherren Mocha, MD  ?cetirizine (ZYRTEC) 10 MG tablet TAKE 1 TABLET BY MOUTH DAILY 02/14/21   Biagio Borg, MD  ?cholecalciferol (VITAMIN D3) 25 MCG (1000 UNIT) tablet Take 1,000 Units by mouth daily.    [provider]  ?clopidogrel (PLAVIX) 75 MG tablet Take 1 tablet (75 mg total) by mouth daily. 02/06/21   Regalado, Jerald Kief A, MD  ?empagliflozin (JARDIANCE) 25 MG TABS tablet Take 1 tablet (25 mg total) by mouth daily before breakfast. 07/24/21   Biagio Borg, MD  ?gabapentin (NEURONTIN) 300 MG capsule TAKE 1 CAPSULE(300 MG) BY MOUTH THREE TIMES DAILY ?Patient taking differently: 300  mg daily. 01/31/21   Biagio Borg, MD  ?glipiZIDE (GLUCOTROL) 5 MG tablet Take 1 tablet (5 mg total) by mouth 2 (two) times daily. ?Patient not taking: Reported on 08/31/2021 02/06/21 02/06/22  Niel Hummer A, MD  ?glucose blood (TRUE METRIX BLOOD GLUCOSE TEST) test strip Use to test blood sugars up to 4 times a day. DX: E10.9, E.11.9 02/17/21   Biagio Borg, MD  ?hydrocortisone cream 1 % Apply to affected area 2 times daily ?Patient not taking: Reported on 09/05/2021 02/02/21   Sherwood Gambler, MD  ?isosorbide mononitrate (IMDUR) 30 MG 24 hr tablet TAKE 3 TABLETS(90 MG) BY MOUTH DAILY 08/26/21   Biagio Borg, MD  ?Lancets 33G MISC Use to test blood sugars up to 4 times a day. DX: E10.9, E11.9 02/17/21   Biagio Borg, MD  ?losartan (COZAAR) 100 MG tablet Take 1 tablet (100 mg total) by mouth daily. 07/24/21   Biagio Borg, MD  ?Multiple Vitamin (MULTIVITAMIN) tablet Take 1 tablet by mouth daily.    [provider]  ?nitroGLYCERIN (NITROSTAT) 0.4 MG SL tablet PLACE 1 TABLET UNDER THE TONGUE EVERY 5 MINUTES AS NEEDED FOR CHEST PAIN 02/14/21   Biagio Borg, MD  ?pantoprazole (PROTONIX) 40 MG tablet Take 1 tablet (40 mg total) by mouth daily. 02/02/21   Biagio Borg, MD  ?PARoxetine (  PAXIL) 20 MG tablet TAKE 1 TABLET(20 MG) BY MOUTH DAILY 07/11/21   Biagio Borg, MD  ?potassium chloride (KLOR-CON) 10 MEQ tablet TAKE 2 TABLETS(20 MEQ) BY MOUTH TWICE DAILY 06/30/21   [name] T, PA-C  ?Semaglutide,0.25 or 0.5MG/DOS, (OZEMPIC, 0.25 OR 0.5 MG/DOSE,) 2 MG/1.5ML SOPN Inject 0.5 mg into the skin once a week. ?Patient not taking: Reported on 08/31/2021 07/12/21   Biagio Borg, MD  ?spironolactone (ALDACTONE) 25 MG tablet TAKE 1/2 TABLET(12.5 MG) BY MOUTH DAILY 05/02/21   Sherren Mocha, MD  ?torsemide (DEMADEX) 20 MG tablet Take 1 tablet (20 mg total) by mouth 2 (two) times daily. 02/03/21 02/03/22  [name] T, PA-C  ?traMADol Veatrice Bourbon) 50 MG tablet 1 tab by mouth four times per day as needed 09/13/21   Biagio Borg,  MD  ?vitamin B-12 (CYANOCOBALAMIN) 1000 MCG tablet Take 1 tablet (1,000 mcg total) by mouth daily. 11/25/19   Biagio Borg, MD  ?   ? ?Allergies    ?Penicillin g and Penicillins   ? ?Review of Systems   ?Review of Systems ? ?Physical Exam ?Updated Vital Signs ?BP (!) 176/112 (BP Location: Right Arm)   Pulse 71   Temp 98.4 ?F (36.9 ?C) (Oral)   Resp 18   Wt (!) 151 kg   SpO2 98%   BMI 43.92 kg/m?  ?Physical Exam ?Constitutional:   ?   General: He is not in acute distress. ?HENT:  ?   Head: Normocephalic and atraumatic.  ?   Comments: Poorly visualized bilateral TM (tortuous), no visible foreign body on initial exam ?Eyes:  ?   Conjunctiva/sclera: Conjunctivae normal.  ?   Pupils: Pupils are equal, round, and reactive to light.  ?Cardiovascular:  ?   Rate and Rhythm: Normal rate and regular rhythm.  ?Pulmonary:  ?   Effort: Pulmonary effort is normal. No respiratory distress.  ?Abdominal:  ?   General: There is no distension.  ?   Tenderness: There is no abdominal tenderness.  ?Skin: ?   General: Skin is warm and dry.  ?Neurological:  ?   General: No focal deficit present.  ?   Mental Status: He is alert. Mental status is at baseline.  ?Psychiatric:     ?   Mood and Affect: Mood normal.     ?   Behavior: Behavior normal.  ? ? ?ED Results / Procedures / Treatments   ?Labs ?(all labs ordered are listed, but only abnormal results are displayed) ?Labs Reviewed - No data to display ? ?EKG ?None ? ?Radiology ?No results found. ? ?Procedures ?Marland KitchenForeign Body Removal ? ?Date/Time: 09/30/2021 8:05 AM ?Performed by: Wyvonnia Dusky, MD ?Authorized by: Wyvonnia Dusky, MD  ?Consent: Verbal consent obtained. ?Risks and benefits: risks, benefits and alternatives were discussed ?Consent given by: patient ?Patient understanding: patient states understanding of the procedure being performed ?Site marked: the operative site was marked ?Patient identity confirmed: verbally with patient ?Time out: Immediately prior to procedure a  "time out" was called to verify the correct patient, procedure, equipment, support staff and site/side marked as required. ?Body area: ear ?Location details: right ear ?Anesthesia method: 1% lidocaine without epinephrine to irrigate ear. ? ?Anesthesia: ?Anesthetic total: 5 mL ? ?Sedation: ?Patient sedated: no ? ?Patient restrained: no ?Patient cooperative: yes ?Localization method: ENT speculum, magnification, probed and visualized ?Removal mechanism: curette ?Complexity: simple ?1 objects recovered. ?Objects recovered: insect ?Post-procedure assessment: foreign body removed ?Patient tolerance: patient tolerated the procedure well with no immediate  complications ?  ? ? ?Medications Ordered in ED ?Medications  ?lidocaine (PF) (XYLOCAINE) 1 % injection 5 mL (5 mLs Other Given 09/30/21 0730)  ? ? ?ED Course/ Medical Decision Making/ A&P ?Clinical Course as of 09/30/21 0806  ?Sat Sep 30, 2021  ?0803 Pt irrigated with lidocaine and I removed a small insect from the right ear with a eurette, small amount of bleeding with removal, he reports "movement" has ceased in ear.  He has muffled/diminished hearing now, unclear if there was a traumatic perforation or if this is from the lidocaine.  There is enough blood in the canal to obscure good vision of TM.  I recommended TM perforation precautions and provided ENT office f/u if he has persisted diminished hearing in 3 days, to make an appointment.  He verbalized understanding.  I do not see a clinical indication for antibiotics at this time. [MT]  ?  ?Clinical Course User Index ?[MT] Wyvonnia Dusky, MD  ? ?                        ?Medical Decision Making ?Risk ?Prescription drug management. ? ? ?Foreign body in right eye, cannot well visualize either TM due to anatomy ?Will irrigate with lidocaine and reassess, for possible insect/ bug ? ?Doubt perforated TM, patient appears comfortable, hearing is intact ? ? ? ? ? ? ? ?Final Clinical Impression(s) / ED Diagnoses ?Final  diagnoses:  ?Foreign body of right ear, initial encounter  ? ? ?Rx / DC Orders ?ED Discharge Orders   ? ? None  ? ?  ? ? ?  ?Wyvonnia Dusky, MD ?09/30/21 [phone] ? ?

## 2021-10-03 DIAGNOSIS — E119 Type 2 diabetes mellitus without complications: Secondary | ICD-10-CM | POA: Diagnosis not present

## 2021-10-03 DIAGNOSIS — H25813 Combined forms of age-related cataract, bilateral: Secondary | ICD-10-CM | POA: Diagnosis not present

## 2021-10-03 DIAGNOSIS — H40053 Ocular hypertension, bilateral: Secondary | ICD-10-CM | POA: Diagnosis not present

## 2021-10-03 LAB — HM DIABETES EYE EXAM

## 2021-10-05 ENCOUNTER — Ambulatory Visit: Payer: Medicare Other

## 2021-10-05 DIAGNOSIS — M2040 Other hammer toe(s) (acquired), unspecified foot: Secondary | ICD-10-CM

## 2021-10-05 DIAGNOSIS — E1159 Type 2 diabetes mellitus with other circulatory complications: Secondary | ICD-10-CM

## 2021-10-05 NOTE — Progress Notes (Signed)
SITUATION ?Reason for Consult: Evaluation for Prefabricated Diabetic Shoes and Custom Diabetic Inserts. ?Patient / Caregiver Report: Patient would like well fitting shoes ? ?OBJECTIVE DATA: ?Patient History / Diagnosis:  ?  ICD-10-CM   ?1. Type 2 diabetes mellitus with other circulatory complication, without long-term current use of insulin (HCC)  E11.59   ?  ?2. Hammer toe, unspecified laterality  M20.40   ?  ? ? ?Physician Treating Diabetes:  Biagio Borg, MD ? ?Current or Previous Devices:   Historical user ? ?In-Person Foot Examination: ?Ulcers & Callousing:   Callusing ?Deformities:    Hammertoes ?Sensation:    Compromised  ?Shoe Size:     16XW ? ?ORTHOTIC RECOMMENDATION ?Recommended Devices: ?- 1x pair prefabricated PDAC approved diabetic shoes; Patient Selected Apex B5000M Size 16XW ?- 3x pair custom-to-patient PDAC approved vacuum formed diabetic insoles. ? ?GOALS OF SHOES AND INSOLES ?- Reduce shear and pressure ?- Reduce / Prevent callus formation ?- Reduce / Prevent ulceration ?- Protect the fragile healing compromised diabetic foot. ? ?Patient would benefit from diabetic shoes and inserts as patient has diabetes mellitus and the patient has one or more of the following conditions: ?- History of partial or complete amputation of the foot ?- History of previous foot ulceration. ?- History of pre-ulcerative callus ?- Peripheral neuropathy with evidence of callus formation ?- Foot deformity ?- Poor circulation ? ?ACTIONS PERFORMED ?Potential out of pocket cost was communicated to patient. Patient understood and consented to measurement and casting. Patient was casted for insoles via crush box and measured for shoes via brannock device. Procedure was explained and patient tolerated procedure well. All questions were answered and concerns addressed. Casts were shipped to central fabrication for HOLD until Certificate of Medical Necessity or otherwise necessary authorization from insurance is  obtained. ? ?PLAN ?Shoes are to be ordered and casts released from hold once all appropriate paperwork is complete. Patient is to be contacted and scheduled for fitting once shoes and insoles have been fabricated and received. ? ?

## 2021-10-08 DIAGNOSIS — E1159 Type 2 diabetes mellitus with other circulatory complications: Secondary | ICD-10-CM | POA: Diagnosis not present

## 2021-10-08 DIAGNOSIS — I5042 Chronic combined systolic (congestive) and diastolic (congestive) heart failure: Secondary | ICD-10-CM | POA: Diagnosis not present

## 2021-10-09 ENCOUNTER — Encounter: Payer: Self-pay | Admitting: Neurology

## 2021-10-09 ENCOUNTER — Inpatient Hospital Stay: Payer: Medicaid Other | Admitting: Neurology

## 2021-10-10 ENCOUNTER — Telehealth: Payer: Self-pay

## 2021-10-10 NOTE — Progress Notes (Cosign Needed)
? ? ?Chronic Care Management ?Pharmacy Assistant  ? ?Name: [name] [name]  MRN: [id number] DOB: 1955/09/15 ? ?Reason for Encounter: Disease State-General Adherence ?  ? ?Recent office visits:  ?None since the last coordination call ? ?Recent consult visits:  ?09/27/21 Gardiner Barefoot, DPM-Podiatry (Diabetes, Callouses) ?Orders: DME Diabetic Shoes, no med changes ? ?Hospital visits:  ?Medication Reconciliation was completed by comparing discharge summary, patient?s EMR and Pharmacy list, and upon discussion with patient. ? ?Admitted to the hospital on 09/30/21 due to foreign body in right ear. Discharge date was 09/30/21. Discharged from [place] [facility] ED.   ? ?Medications that remain the same after Hospital Discharge:??  ?-All other medications will remain the same.   ? ?Medications: ?Outpatient Encounter Medications as of 10/10/2021  ?Medication Sig  ? albuterol (VENTOLIN HFA) 108 (90 Base) MCG/ACT inhaler Inhale 2 puffs into the lungs every 6 (six) hours as needed for wheezing or shortness of breath.  ? Alcohol Swabs (B-D SINGLE USE SWABS BUTTERFLY) PADS Use up to 4 times a day to test blood sugars. Dx: E10.9, E11.9  ? aspirin 81 MG EC tablet Take 81 mg by mouth daily.  ? atorvastatin (LIPITOR) 80 MG tablet Take 1 tablet (80 mg total) by mouth at bedtime.  ? Blood Glucose Calibration (TRUE METRIX LEVEL 1) Low SOLN Use as needed. Dx: E10.9, E11.9  ? blood glucose meter kit and supplies Dispense per insurance preference. Use as directed once daily. (FOR ICD-10  E11.9).  ? carvedilol (COREG) 25 MG tablet TAKE 1 TABLET(25 MG) BY MOUTH TWICE DAILY WITH A MEAL  ? cetirizine (ZYRTEC) 10 MG tablet TAKE 1 TABLET BY MOUTH DAILY  ? cholecalciferol (VITAMIN D3) 25 MCG (1000 UNIT) tablet Take 1,000 Units by mouth daily.  ? clopidogrel (PLAVIX) 75 MG tablet Take 1 tablet (75 mg total) by mouth daily.  ? empagliflozin (JARDIANCE) 25 MG TABS tablet Take 1 tablet (25 mg total) by mouth daily before breakfast.  ? gabapentin  (NEURONTIN) 300 MG capsule TAKE 1 CAPSULE(300 MG) BY MOUTH THREE TIMES DAILY (Patient taking differently: 300 mg daily.)  ? glipiZIDE (GLUCOTROL) 5 MG tablet Take 1 tablet (5 mg total) by mouth 2 (two) times daily. (Patient not taking: Reported on 08/31/2021)  ? glucose blood (TRUE METRIX BLOOD GLUCOSE TEST) test strip Use to test blood sugars up to 4 times a day. DX: E10.9, E.11.9  ? hydrocortisone cream 1 % Apply to affected area 2 times daily (Patient not taking: Reported on 09/05/2021)  ? isosorbide mononitrate (IMDUR) 30 MG 24 hr tablet TAKE 3 TABLETS(90 MG) BY MOUTH DAILY  ? Lancets 33G MISC Use to test blood sugars up to 4 times a day. DX: E10.9, E11.9  ? losartan (COZAAR) 100 MG tablet Take 1 tablet (100 mg total) by mouth daily.  ? Multiple Vitamin (MULTIVITAMIN) tablet Take 1 tablet by mouth daily.  ? nitroGLYCERIN (NITROSTAT) 0.4 MG SL tablet PLACE 1 TABLET UNDER THE TONGUE EVERY 5 MINUTES AS NEEDED FOR CHEST PAIN  ? pantoprazole (PROTONIX) 40 MG tablet Take 1 tablet (40 mg total) by mouth daily.  ? PARoxetine (PAXIL) 20 MG tablet TAKE 1 TABLET(20 MG) BY MOUTH DAILY  ? potassium chloride (KLOR-CON) 10 MEQ tablet TAKE 2 TABLETS(20 MEQ) BY MOUTH TWICE DAILY  ? Semaglutide,0.25 or 0.5MG /DOS, (OZEMPIC, 0.25 OR 0.5 MG/DOSE,) 2 MG/1.5ML SOPN Inject 0.5 mg into the skin once a week. (Patient not taking: Reported on 08/31/2021)  ? spironolactone (ALDACTONE) 25 MG tablet TAKE 1/2 TABLET(12.5 MG) BY MOUTH DAILY  ?  torsemide (DEMADEX) 20 MG tablet Take 1 tablet (20 mg total) by mouth 2 (two) times daily.  ? traMADol (ULTRAM) 50 MG tablet 1 tab by mouth four times per day as needed  ? vitamin B-12 (CYANOCOBALAMIN) 1000 MCG tablet Take 1 tablet (1,000 mcg total) by mouth daily.  ? ?No facility-administered encounter medications on file as of 10/10/2021.  ? ?3 attempts to contacted [name] for General Review Call. Unable to reach patient, left messages for patient to return call. ? ? ?Chart Review: ? ?Have there been  any documented new, changed, or discontinued medications since last visit? No (If yes, include name, dose, frequency, date) ?Has there been any documented recent hospitalizations or ED visits since last visit with Clinical Pharmacist? Yes ?Brief Summary (including medication and/or Diagnosis changes): ? ? ?Adherence Review: ? ?Does the Clinical Pharmacist Assistant have access to adherence rates? Yes ?Adherence rates for STAR metric medications (List medication(s)/day supply/ last 2 fill dates). ?Adherence rates for medications indicated for disease state being reviewed (List medication(s)/day supply/ last 2 fill dates). ?Does the patient have >5 day gap between last estimated fill dates for any of the above medications or other medication gaps? No ?Reason for medication gaps. ? ? ?14. Next visit Type: telephone ?      Visit with:Clinical Pharmacist ?       Date:01/01/22 ?       Time:1pm ?  ?Care Gaps: ?Colonoscopy-03/17/12 ?Diabetic Foot Exam-08/03/20 ?Ophthalmology-NA ?Dexa Scan - NA ?Annual Well Visit - NA ?Micro albumin-06/23/20 ?Hemoglobin A1c- 07/12/21 ? ?Star Rating Drugs: ?Atorvastatin 80 mg-last fill 07/26/21 90 ds ?Losartan 100 mg-last fill 07/12/21 90 ds ?Jardiance 25 mg-last fill 09/30/21 30 ds ?Ozempic 0.5 mg/dose-last fill 09/05/21 30 ds ?Glipizide 5 mg-last fill 08/30/21 90 ds ? ?Ethelene Hal ?Clinical Pharmacist Assistant ?[phone]  ?

## 2021-10-13 ENCOUNTER — Other Ambulatory Visit: Payer: Self-pay

## 2021-10-13 MED ORDER — ACCU-CHEK GUIDE VI STRP: ORAL_STRIP | 12 refills | Status: DC

## 2021-10-13 MED ORDER — ACCU-CHEK SOFTCLIX LANCETS MISC: 12 refills | Status: DC

## 2021-10-26 ENCOUNTER — Other Ambulatory Visit: Payer: Self-pay | Admitting: Internal Medicine

## 2021-10-26 MED ORDER — OZEMPIC (0.25 OR 0.5 MG/DOSE) 2 MG/3ML ~~LOC~~ SOPN: PEN_INJECTOR | SUBCUTANEOUS | 3 refills | Status: DC

## 2021-10-26 NOTE — Progress Notes (Signed)
Cardiology Office Note:    Date:  10/27/2021   ID:  [name] [name], DOB 20-Sep-1955, MRN [id number]  PCP:  Biagio Borg, MD  [place] Providers Cardiologist:  Sherren Mocha, MD Cardiology APP:  [facility]    Referring MD: Biagio Borg, MD   Chief Complaint:  F/u for CHF, CAD    Patient Profile: Coronary artery disease S/p PCI to RCA and PL1 S/p cutting POBA to RCA 2/2 ISR in 5/08 S/p ant STEMI in 3/14 >> PCI: DES to LAD S/p NSTEMI in 6/15 >> PCI: DES to [place] S/p NSTEMI 10/15 >> Med Rx Myoview 1/17: no ischemia Cath at [facility] 05/2019: RCA 100 CTO Heart failure with preserved ejection fraction  Ischemic CM Echocardiogram 05/2019 ([facility]): EF 50-55 Echocardiogram 8/21: EF 50, inf AK Supraventricular tachycardia Monitor 9/21: Longest 17 seconds beta-blocker increased  PVCs (monitor 9/21: 6% burden) beta-blocker Rx  Hypertension Hyperlipidemia Chronic kidney disease Diabetes mellitus OSA GERD Tobacco abuse Aortic atherosclerosis  Hx of TIA in Aug 22  Prior CV Studies: CARDIAC TELEMETRY MONITORING-INTERPRETATION ONLY 04/05/2021 Summary: The basic rhythm is normal sinus with an average heart rate of 61 bpm.  There are occasional PVCs with an overall burden of 1.9%.  There are no prolonged ventricular arrhythmias.  There is no atrial fibrillation or flutter identified.  There is no high-grade AV block noted.    ECHOCARDIOGRAM 02/06/21 EF 45-50, global HK, moderate LVH, G1 DD, RVSP 15.1, mild LAE, trivial AI, dilated aortic root (37 mm)   ZIO monitor 02/2020 NSR, Avg HR 59; No AFib/Flutter; PVCs 6% burden; occ PACs, rare Supraventricular Tachycardia runs   Echocardiogram 01/14/20 EF 50, inf AK , post and inf HK, mod LVH, normal RVSF, RVSP 25.3, mild to mod LAE, trivial MR,    Cardiac catheterization 05/14/2019 ([facility]) LM normal LAD diff irregs LCx diffuse irregs RCA mid 100 (CTO)   Echocardiogram 05/13/2019 ([facility]) Mod conc LVH, inf AK and likely post HK, EF  50-55   Myoview 06/16/15 Intermediate risk stress nuclear study with a large, severe, predominantly fixed inferior lateral defect consistent with prior infarct; no significant ischemia; EF 35 but visually appears better; akinesis of the basal and mid inferior lateral wall; mild LVE; suggest echo to better assess LV function. Study intermediate risk due to reduced LV function.    Carotid US (4/13):  Bilateral: no ICA stenosis  History of Present Illness:   [name] [name] is a 66 y.o. male with the above problem list.  He was last seen in Sep 22 after an admission to the hospital with a TIA.  I had him do a f/u Zio AT monitor which did not show AFib.  He returns for f/u.  He is here alone.  Over the last week, he notes increasing shortness of breath and lower extremity swelling.  He has been eating fast food ([employer] chicken).  He notes that his diet is rich in salt.  He does not have a scale at home.  We provided him with a phone today.  He has not had exertional chest discomfort.  He has had some episodes of fleeting (<1 seconds) left-sided discomfort.  He describes it as a pinprick.  He has not had any symptoms reminiscent of his previous angina.  He has not had syncope, orthopnea.        Past Medical History:  Diagnosis Date   CAD (coronary artery disease)    a. s/p BMS pRCA 2002; b. DES to [facility] &  DES p/m RCA 2006; c. PCI/DES OM4 2007; d. PCI/CBA to RCA for ISR 10/2006; e.08/2012 STEMI/Cath/PCI:  LAD 95% >> PCI: Promus Prem DES  //  f. NSTEMI 10/15 >> LHC: mLAD stent ok, dLAD 70, OM1 CTO, OM2 stent ok, dOM2 90, OM3 30-40, dLCx 51, p-mRCA stent ok, mRCA stent ok w/ 60-70 ISR, EF 50% >> med Rx // MV 1/17: no ischemia // Cath ([facility]) 05/2019: RCA 100 (CTO)    Combined systolic and diastolic CHF    a. Myoview 1/17: EF 35%  //  b. Echo 1/17 EF 45-50% // Echo 05/2019: EF 50-55 // Echocardiogram 8/21: EF 50, inf AK, post and mid inf HK, mod LVH, RVSP 25.3, mild to mod LAE, trivial MR    Depression     Diabetes mellitus ([place])    a. A1c 8.8 08/2012->Metformin initiated. => b. A1c (9/14): 6.6   Gastroesophageal reflux disease    History of nuclear stress test    a. Myoview 1/17: EF 35%, fixed inferior lateral defect consistent with infarct, no ischemia, intermediate risk   History of pneumonia    History of stroke 01/2021   Zio Monitor 11/22: NSR, avg HR 61; PVCs (1.9%); no AFib, no high grade HB, no ventricular arrhythmias   HTN (hypertension)    Hx of NSTEMI    Hyperlipidemia    Ischemic cardiomyopathy    a. EF 40%; improved to normal;  b. 08/2012 Echo: EF 50-55%, mod LVH.//  c. Echo 9/16: Inf HK, mild LVH, EF 55%, mild LAE, normal RVF, mild RAE, PASP 35 mmHg  //  d. Echo 1/17: EF 45-50%, inferior HK, mild BAE, PASP 33 mmHg   Obesity    OSA (obstructive sleep apnea)    Does not use CPAP as of 05/2011   Tobacco abuse    Current Medications: Current Meds  Medication Sig   Accu-Chek Softclix Lancets lancets Use as instructed E11.9   albuterol (VENTOLIN HFA) 108 (90 Base) MCG/ACT inhaler Inhale 2 puffs into the lungs every 6 (six) hours as needed for wheezing or shortness of breath.   Alcohol Swabs (B-D SINGLE USE SWABS BUTTERFLY) PADS Use up to 4 times a day to test blood sugars. Dx: E10.9, E11.9   aspirin 81 MG EC tablet Take 81 mg by mouth daily.   atorvastatin (LIPITOR) 80 MG tablet Take 1 tablet (80 mg total) by mouth at bedtime.   Blood Glucose Calibration (TRUE METRIX LEVEL 1) Low SOLN Use as needed. Dx: E10.9, E11.9   blood glucose meter kit and supplies Dispense per insurance preference. Use as directed once daily. (FOR ICD-10  E11.9).   carvedilol (COREG) 25 MG tablet TAKE 1 TABLET(25 MG) BY MOUTH TWICE DAILY WITH A MEAL   cetirizine (ZYRTEC) 10 MG tablet TAKE 1 TABLET BY MOUTH DAILY   cholecalciferol (VITAMIN D3) 25 MCG (1000 UNIT) tablet Take 1,000 Units by mouth daily.   clopidogrel (PLAVIX) 75 MG tablet Take 1 tablet (75 mg total) by mouth daily.   empagliflozin (JARDIANCE)  25 MG TABS tablet Take 1 tablet (25 mg total) by mouth daily before breakfast.   gabapentin (NEURONTIN) 300 MG capsule TAKE 1 CAPSULE(300 MG) BY MOUTH THREE TIMES DAILY (Patient taking differently: 300 mg daily.)   glucose blood (ACCU-CHEK GUIDE) test strip Use as instructed E11.9   glucose blood (TRUE METRIX BLOOD GLUCOSE TEST) test strip Use to test blood sugars up to 4 times a day. DX: E10.9, E.11.9   hydrocortisone cream 1 % Apply to affected area  2 times daily   isosorbide mononitrate (IMDUR) 30 MG 24 hr tablet TAKE 3 TABLETS(90 MG) BY MOUTH DAILY   Lancets 33G MISC Use to test blood sugars up to 4 times a day. DX: E10.9, E11.9   losartan (COZAAR) 100 MG tablet Take 1 tablet (100 mg total) by mouth daily.   nitroGLYCERIN (NITROSTAT) 0.4 MG SL tablet PLACE 1 TABLET UNDER THE TONGUE EVERY 5 MINUTES AS NEEDED FOR CHEST PAIN   pantoprazole (PROTONIX) 40 MG tablet Take 1 tablet (40 mg total) by mouth daily.   PARoxetine (PAXIL) 20 MG tablet TAKE 1 TABLET(20 MG) BY MOUTH DAILY   potassium chloride (KLOR-CON) 10 MEQ tablet TAKE 2 TABLETS(20 MEQ) BY MOUTH TWICE DAILY   Semaglutide,0.25 or 0.5MG /DOS, (OZEMPIC, 0.25 OR 0.5 MG/DOSE,) 2 MG/3ML SOPN Take 0.5 mg subq once weekly   spironolactone (ALDACTONE) 25 MG tablet TAKE 1/2 TABLET(12.5 MG) BY MOUTH DAILY   torsemide (DEMADEX) 20 MG tablet TAKE 2 TABLETS BY MOUTH IN THE A.M, TAKE 1 TABLET BY MOUTH IN THE P.M. X'S 3 DAYS THEN REDUCE IT TO TAKING 1 TABLET BY MOUTH TWICE A DAY   traMADol (ULTRAM) 50 MG tablet 1 tab by mouth four times per day as needed   vitamin B-12 (CYANOCOBALAMIN) 1000 MCG tablet Take 1 tablet (1,000 mcg total) by mouth daily.   [DISCONTINUED] glipiZIDE (GLUCOTROL) 5 MG tablet Take 1 tablet (5 mg total) by mouth 2 (two) times daily.   [DISCONTINUED] Multiple Vitamin (MULTIVITAMIN) tablet Take 1 tablet by mouth daily.   [DISCONTINUED] torsemide (DEMADEX) 20 MG tablet Take 1 tablet (20 mg total) by mouth 2 (two) times daily.     Allergies:   Penicillin g and Penicillins   Social History   Tobacco Use   Smoking status: Former    Packs/day: 0.50    Years: 30.00    Pack years: 15.00    Types: Cigarettes   Smokeless tobacco: Never   Tobacco comments:    trying to cut down  Vaping Use   Vaping Use: Never used  Substance Use Topics   Alcohol use: No   Drug use: No    Comment: remote marijuana use    Family Hx: The patient's family history includes Cancer in his mother; Coronary artery disease in an other family member; Depression in his mother; Hypertension in his mother; Other in his father. There is no history of Heart attack or Stroke.  Review of Systems  Respiratory:  Positive for cough (non-productive).   Gastrointestinal:  Negative for hematochezia and melena.  Genitourinary:  Negative for hematuria.    EKGs/Labs/Other Test Reviewed:    EKG:  EKG is not ordered today.  The ekg ordered today demonstrates n/a  Recent Labs: 01/18/2021: NT-Pro BNP 243 02/02/2021: B Natriuretic Peptide 178.9 07/12/2021: ALT 13; BUN 27; Creatinine, Ser 1.81; Hemoglobin 15.2; Platelets 190.0; Potassium 4.5; Sodium 145; TSH 1.83   Recent Lipid Panel Recent Labs    07/12/21 1058  CHOL 186  TRIG 105.0  HDL 47.40  VLDL 21.0  LDLCALC 118*     Risk Assessment/Calculations:         Physical Exam:    VS:  BP 130/80   Pulse 64   Ht 6\' 1"  (1.854 m)   Wt (!) 327 lb 3.2 oz (148.4 kg)   BMI 43.17 kg/m     Wt Readings from Last 3 Encounters:  10/27/21 (!) 327 lb 3.2 oz (148.4 kg)  09/30/21 (!) 332 lb 14.3 oz (151 kg)  09/13/21 [name])  334 lb 8 oz (151.7 kg)    Constitutional:      Appearance: Healthy appearance. Not in distress.  Neck:     Vascular: JVD elevated.  Pulmonary:     Effort: Pulmonary effort is normal.     Breath sounds: No wheezing. No rales.  Cardiovascular:     Normal rate. Regular rhythm. Normal S1. Normal S2.      Murmurs: There is no murmur.     No gallop.   Edema:    Peripheral edema  present.    Ankle: bilateral 1+ edema of the ankle. Abdominal:     Palpations: Abdomen is soft.  Skin:    General: Skin is warm and dry.  Neurological:     General: No focal deficit present.     Mental Status: Alert and oriented to person, place and time.     Cranial Nerves: Cranial nerves are intact.         ASSESSMENT & PLAN:   Chronic heart failure with preserved ejection fraction (HFpEF) ([place]) He has evidence of some volume excess today.  He admits to dietary indiscretion with salt.  He does not have scales at home.  His neck veins are up but his lungs are clear.  He does have some increased swelling in his ankles. Increase torsemide to 40 mg in the morning and 20 mg in the evening x3 days Then resume torsemide 20 mg twice daily BMET, BNP today Continue Jardiance (prescribed for diabetes), spironolactone 12.5 mg daily. If renal function remains stable, consider changing losartan to [employer] daily Follow-up 4 months  Aortic atherosclerosis (HCC) Continue aspirin, statin.  Coronary artery disease involving native coronary artery of native heart without angina pectoris History of multiple PCI procedures.  His last cardiac catheterization in 2020 demonstrated a chronically occluded RCA and no significant disease in the LCx or LAD.  He is doing well without anginal symptoms.  He has had some minor left-sided chest discomfort that sounds noncardiac.  He knows to contact me if he has any change in his symptoms.  Continue aspirin 81, Clopidogrel 75 mg once daily, Atorvastatin 80 mg once daily, carvedilol 25 mg twice daily, isosorbide mononitrate 90 mg daily, nitroglycerin as needed.  Follow-up in 4 months.  Essential hypertension The patient's blood pressure is controlled on his current regimen.  Continue current therapy.   Hyperlipidemia LDL was above target in February 2023.  He does note some dietary indiscretion recently.  Continue to work on diet.  Continue atorvastatin 80 mg  daily.  If LDL remains above 70, consider adding ezetimibe.  PVC's (premature ventricular contractions) Overall controlled.  Continue beta-blocker Rx.   SVT (supraventricular tachycardia) ([place]) Well-controlled on beta-blocker therapy.          Dispo:  Return in about 4 months (around 02/27/2022) for Routine Follow Up, w/ [name], PA-C.   Medication Adjustments/Labs and Tests Ordered: Current medicines are reviewed at length with the patient today.  Concerns regarding medicines are outlined above.  Tests Ordered: Orders Placed This Encounter  Procedures   Basic metabolic panel   Pro b natriuretic peptide (BNP)   Medication Changes: Meds ordered this encounter  Medications   torsemide (DEMADEX) 20 MG tablet    Sig: TAKE 2 TABLETS BY MOUTH IN THE A.M, TAKE 1 TABLET BY MOUTH IN THE P.M. X'S 3 DAYS THEN REDUCE IT TO TAKING 1 TABLET BY MOUTH TWICE A DAY    Dispense:  180 tablet    Refill:  3  Signed, [name], PA-C  10/27/2021 12:10 PM    [facility] [address], [place], [place]  [address] Phone: [phone]; Fax: [phone]

## 2021-10-27 ENCOUNTER — Ambulatory Visit (INDEPENDENT_AMBULATORY_CARE_PROVIDER_SITE_OTHER): Payer: Medicare Other | Admitting: Physician Assistant

## 2021-10-27 ENCOUNTER — Encounter: Payer: Self-pay | Admitting: Physician Assistant

## 2021-10-27 VITALS — BP 130/80 | HR 64 | Ht 73.0 in | Wt 327.2 lb

## 2021-10-27 DIAGNOSIS — E7800 Pure hypercholesterolemia, unspecified: Secondary | ICD-10-CM | POA: Diagnosis not present

## 2021-10-27 DIAGNOSIS — I10 Essential (primary) hypertension: Secondary | ICD-10-CM

## 2021-10-27 DIAGNOSIS — I493 Ventricular premature depolarization: Secondary | ICD-10-CM

## 2021-10-27 DIAGNOSIS — I471 Supraventricular tachycardia: Secondary | ICD-10-CM

## 2021-10-27 DIAGNOSIS — I2510 Atherosclerotic heart disease of native coronary artery without angina pectoris: Secondary | ICD-10-CM

## 2021-10-27 DIAGNOSIS — I5032 Chronic diastolic (congestive) heart failure: Secondary | ICD-10-CM

## 2021-10-27 DIAGNOSIS — I700 Atherosclerosis of aorta: Secondary | ICD-10-CM

## 2021-10-27 MED ORDER — TORSEMIDE 20 MG PO TABS: ORAL_TABLET | ORAL | 3 refills | Status: DC

## 2021-10-27 NOTE — Assessment & Plan Note (Signed)
LDL was above target in February 2023.  He does note some dietary indiscretion recently.  Continue to work on diet.  Continue atorvastatin 80 mg daily.  If LDL remains above 70, consider adding ezetimibe.

## 2021-10-27 NOTE — Assessment & Plan Note (Signed)
Well-controlled on beta-blocker therapy.

## 2021-10-27 NOTE — Patient Instructions (Signed)
Medication Instructions:  Your physician has recommended you make the following change in your medication:   INCREASE the Torsemide to 20 mg taking 2 tablets in the a.m, 1 in the p.m for 3 DAYS ONLY then reduce it back down to 1 tablet twice a day  *If you need a refill on your cardiac medications before your next appointment, please call your pharmacy*   Lab Work: TODAY:  BMET & PRO BNP  If you have labs (blood work) drawn today and your tests are completely normal, you will receive your results only by: [place] (if you have MyChart) OR A paper copy in the mail If you have any lab test that is abnormal or we need to change your treatment, we will call you to review the results.   Testing/Procedures: None ordered   Follow-Up: At [facility], you and your health needs are our priority.  As part of our continuing mission to provide you with exceptional heart care, we have created designated Provider Care Teams.  These Care Teams include your primary Cardiologist (physician) and Advanced Practice Providers (APPs -  Physician Assistants and Nurse Practitioners) who all work together to provide you with the care you need, when you need it.  We recommend signing up for the patient portal called "MyChart".  Sign up information is provided on this After Visit Summary.  MyChart is used to connect with patients for Virtual Visits (Telemedicine).  Patients are able to view lab/test results, encounter notes, upcoming appointments, etc.  Non-urgent messages can be sent to your provider as well.   To learn more about what you can do with MyChart, go to [email or web address].    Your next appointment:   02/27/22 arrive at 11:05   The format for your next appointment:   In Person  Provider:   [name], PA-C         Other Instructions   Important Information About Sugar

## 2021-10-27 NOTE — Assessment & Plan Note (Signed)
He has evidence of some volume excess today.  He admits to dietary indiscretion with salt.  He does not have scales at home.  His neck veins are up but his lungs are clear.  He does have some increased swelling in his ankles.  Increase torsemide to 40 mg in the morning and 20 mg in the evening x3 days  Then resume torsemide 20 mg twice daily  BMET, BNP today  Continue Jardiance (prescribed for diabetes), spironolactone 12.5 mg daily.  If renal function remains stable, consider changing losartan to [employer] daily  Follow-up 4 months

## 2021-10-27 NOTE — Assessment & Plan Note (Signed)
History of multiple PCI procedures.  His last cardiac catheterization in 2020 demonstrated a chronically occluded RCA and no significant disease in the LCx or LAD.  He is doing well without anginal symptoms.  He has had some minor left-sided chest discomfort that sounds noncardiac.  He knows to contact me if he has any change in his symptoms.  Continue aspirin 81, Clopidogrel 75 mg once daily, Atorvastatin 80 mg once daily, carvedilol 25 mg twice daily, isosorbide mononitrate 90 mg daily, nitroglycerin as needed.  Follow-up in 4 months.

## 2021-10-27 NOTE — Assessment & Plan Note (Signed)
Overall controlled.  Continue beta-blocker Rx.

## 2021-10-27 NOTE — Assessment & Plan Note (Signed)
The patient's blood pressure is controlled on his current regimen.  Continue current therapy.  

## 2021-10-27 NOTE — Assessment & Plan Note (Signed)
Continue aspirin, statin.  

## 2021-10-28 LAB — BASIC METABOLIC PANEL
BUN/Creatinine Ratio: 12 (ref 10–24)
BUN: 20 mg/dL (ref 8–27)
CO2: 27 mmol/L (ref 20–29)
Calcium: 10 mg/dL (ref 8.6–10.2)
Chloride: 108 mmol/L — ABNORMAL HIGH (ref 96–106)
Creatinine, Ser: 1.7 mg/dL — ABNORMAL HIGH (ref 0.76–1.27)
Glucose: 104 mg/dL — ABNORMAL HIGH (ref 70–99)
Potassium: 4.6 mmol/L (ref 3.5–5.2)
Sodium: 150 mmol/L — ABNORMAL HIGH (ref 134–144)
eGFR: 44 mL/min/{1.73_m2} — ABNORMAL LOW (ref >59)

## 2021-10-28 LAB — PRO B NATRIURETIC PEPTIDE: NT-Pro BNP: 114 pg/mL (ref 0–210)

## 2021-10-31 ENCOUNTER — Telehealth: Payer: Self-pay | Admitting: *Deleted

## 2021-10-31 DIAGNOSIS — Z79899 Other long term (current) drug therapy: Secondary | ICD-10-CM

## 2021-10-31 NOTE — Telephone Encounter (Signed)
-----   Message from Liliane Shi, [place] sent at 10/29/2021 11:13 PM EDT ----- Creatinine stable.  K+ normal.  BNP normal.  Sodium level high. PLAN:  -Continue current medications/treatment plan and follow up as scheduled.  -Repeat BMET in 1 week [name], PA-C    10/29/2021 11:11 PM

## 2021-11-03 ENCOUNTER — Other Ambulatory Visit: Payer: Medicare Other | Admitting: *Deleted

## 2021-11-03 DIAGNOSIS — Z79899 Other long term (current) drug therapy: Secondary | ICD-10-CM | POA: Diagnosis not present

## 2021-11-03 LAB — BASIC METABOLIC PANEL
BUN/Creatinine Ratio: 17 (ref 10–24)
BUN: 30 mg/dL — ABNORMAL HIGH (ref 8–27)
CO2: 27 mmol/L (ref 20–29)
Calcium: 9.1 mg/dL (ref 8.6–10.2)
Chloride: 105 mmol/L (ref 96–106)
Creatinine, Ser: 1.73 mg/dL — ABNORMAL HIGH (ref 0.76–1.27)
Glucose: 138 mg/dL — ABNORMAL HIGH (ref 70–99)
Potassium: 4.3 mmol/L (ref 3.5–5.2)
Sodium: 143 mmol/L (ref 134–144)
eGFR: 44 mL/min/{1.73_m2} — ABNORMAL LOW (ref >59)

## 2021-11-07 ENCOUNTER — Other Ambulatory Visit: Payer: Self-pay | Admitting: Cardiovascular Disease

## 2021-11-07 NOTE — Progress Notes (Signed)
Pt has been made aware of normal result and verbalized understanding.  jw

## 2021-11-08 ENCOUNTER — Ambulatory Visit (INDEPENDENT_AMBULATORY_CARE_PROVIDER_SITE_OTHER): Payer: Medicare Other

## 2021-11-08 DIAGNOSIS — E1122 Type 2 diabetes mellitus with diabetic chronic kidney disease: Secondary | ICD-10-CM

## 2021-11-08 DIAGNOSIS — E1140 Type 2 diabetes mellitus with diabetic neuropathy, unspecified: Secondary | ICD-10-CM

## 2021-11-08 DIAGNOSIS — E785 Hyperlipidemia, unspecified: Secondary | ICD-10-CM | POA: Diagnosis not present

## 2021-11-08 DIAGNOSIS — Z87891 Personal history of nicotine dependence: Secondary | ICD-10-CM

## 2021-11-08 DIAGNOSIS — F32A Depression, unspecified: Secondary | ICD-10-CM

## 2021-11-08 DIAGNOSIS — Z7984 Long term (current) use of oral hypoglycemic drugs: Secondary | ICD-10-CM | POA: Diagnosis not present

## 2021-11-08 DIAGNOSIS — I130 Hypertensive heart and chronic kidney disease with heart failure and stage 1 through stage 4 chronic kidney disease, or unspecified chronic kidney disease: Secondary | ICD-10-CM | POA: Diagnosis not present

## 2021-11-08 DIAGNOSIS — I10 Essential (primary) hypertension: Secondary | ICD-10-CM

## 2021-11-08 DIAGNOSIS — N1831 Chronic kidney disease, stage 3a: Secondary | ICD-10-CM

## 2021-11-08 DIAGNOSIS — I5030 Unspecified diastolic (congestive) heart failure: Secondary | ICD-10-CM

## 2021-11-08 DIAGNOSIS — Z7985 Long-term (current) use of injectable non-insulin antidiabetic drugs: Secondary | ICD-10-CM

## 2021-11-08 DIAGNOSIS — E1159 Type 2 diabetes mellitus with other circulatory complications: Secondary | ICD-10-CM

## 2021-11-08 DIAGNOSIS — I5042 Chronic combined systolic (congestive) and diastolic (congestive) heart failure: Secondary | ICD-10-CM

## 2021-11-08 DIAGNOSIS — E7849 Other hyperlipidemia: Secondary | ICD-10-CM

## 2021-11-08 NOTE — Patient Instructions (Signed)
Visit Information  Following are the goals we discussed today:   Monitor and Manage My Blood Sugars   Timeframe:  Long-Range Goal Priority:  High Start Date: 05/08/2021                            Expected End Date:  05/08/2022                     Follow Up Date 12/2021   - check blood sugar at prescribed times - check blood sugar if I feel it is too high or too low - enter blood sugar readings and medication or insulin into daily log - take the blood sugar log to all doctor visits - take the blood sugar meter to all doctor visits    Why is this important?   Checking your blood sugar at home helps to keep it from getting very high or very low.  Writing the results in a diary or log helps the doctor know how to care for you.  Your blood sugar log should have the time, date and the results.  Also, write down the amount of insulin or other medicine that you take.  Other information, like what you ate, exercise done and how you were feeling, will also be helpful.    Track and Manage MY Blood Pressure  Timeframe:  Long-Range Goal Priority:  High Start Date:   05/08/2021                          Expected End Date:  05/08/2022                     Follow Up Date 12/2021   - check blood pressure daily - choose a place to take my blood pressure (home, clinic or office, retail store) - write blood pressure results in a log or diary    Why is this important?   You won't feel high blood pressure, but it can still hurt your blood vessels.  High blood pressure can cause heart or kidney problems. It can also cause a stroke.  Making lifestyle changes like losing a little weight or eating less salt will help.  Checking your blood pressure at home and at different times of the day can help to control blood pressure.  If the doctor prescribes medicine remember to take it the way the doctor ordered.  Call the office if you cannot afford the medicine or if there are questions about it.    Plan:  Telephone follow up appointment with care management team member scheduled for:  2 months The patient has been provided with contact information for the care management team and has been advised to call with any health related questions or concerns.   Tomasa Blase, PharmD Clinical Pharmacist, [facility]   Please call the care guide team at [phone] if you need to cancel or reschedule your appointment.   Patient verbalizes understanding of instructions and care plan provided today and agrees to view in [facility]. Active MyChart status and patient understanding of how to access instructions and care plan via MyChart confirmed with patient.

## 2021-11-08 NOTE — Progress Notes (Signed)
Chronic Care Management Pharmacy Note  11/08/2021 Name:  [name] [name] MRN:  [id number] DOB:  07/18/1955  Summary: -Per patient has been much better with compliance to his medications - has started ozempic and denies any issues since starting  -Has not yet started using pill box to keep track of his medications, but reports that he has been taking medications as prescribed  -Could not recall many BG readings, reports in AM typically is around 140, after meals and PM averaging 160-180 -Could not recall BP readings recently, states that does not feel his BP cuff he currently has is accurate   Recommendations/Changes made from today's visit: -Recommended for patient to start monitoring BP, BG, and weights daily, reviewed with patient BP, BG goals, along with monitoring parameters regarding weight -Advised patient to use pill box to be able to ensure he is taking medications as prescribed, pt agreeable to start using  -Pt to reach out to office should BP average >140/90, or if BG is averaging >150 fasted / >180 2 hours post prandially  -F/u in 2 months   Subjective: [name] [name] is an 66 y.o. year old male who is a primary patient of [name], Hunt Oris, MD.  The CCM team was consulted for assistance with disease management and care coordination needs.    Engaged with patient by telephone for follow up visit in response to provider referral for pharmacy case management and/or care coordination services.   Consent to Services:  The patient was given the following information about Chronic Care Management services today, agreed to services, and gave verbal consent: 1. CCM service includes personalized support from designated clinical staff supervised by the primary care provider, including individualized plan of care and coordination with other care providers 2. 24/7 contact phone numbers for assistance for urgent and routine care needs. 3. Service will only be billed when office clinical staff spend  20 minutes or more in a month to coordinate care. 4. Only one practitioner may furnish and bill the service in a calendar month. 5.The patient may stop CCM services at any time (effective at the end of the month) by phone call to the office staff. 6. The patient will be responsible for cost sharing (co-pay) of up to 20% of the service fee (after annual deductible is met). Patient agreed to services and consent obtained.  Patient Care Team: Biagio Borg, MD as PCP - General (Internal Medicine) Sherren Mocha, MD as PCP - Cardiology (Cardiology) Sherren Mocha, MD as Consulting Physician (Cardiology) Sharmon Revere as Physician Assistant (Cardiology) Sueanne Margarita, MD as Consulting Physician (Sleep Medicine) Knox Royalty, RN as Case Manager Jahon Bart, Darnelle Maffucci, [facility] as Pharmacist (Pharmacist)  Recent office visits:  09/13/2021 - Dr. [name] - start taking aldactone - restart albuterol prn    Recent consult visits:  10/27/2021 - [name] PA-C - cardiology - consider adding ezetimibe should LDL remain elevated, increase torsemide to 3m in AM and 260min PM x 3 days, then resume 2028mID dosing - f/u in 4 months  09/27/2021 - Dr. MayPrudence DavidsonPodiatry - no changes to medications     Hospital visits:  09/30/2021 - ED visit - foreign body in ear - removed, discharged without changes to medications   Objective:  Lab Results  Component Value Date   CREATININE 1.73 (H) 11/03/2021   BUN 30 (H) 11/03/2021   GFR 39.05 (L) 07/12/2021   GFRNONAA 56 (L) 02/06/2021   GFRAA 57 (L) 05/27/2020  NA 143 11/03/2021   K 4.3 11/03/2021   CALCIUM 9.1 11/03/2021   CO2 27 11/03/2021   GLUCOSE 138 (H) 11/03/2021    Lab Results  Component Value Date/Time   HGBA1C 7.8 (H) 07/12/2021 10:58 AM   HGBA1C 10.6 (H) 02/06/2021 03:23 AM   GFR 39.05 (L) 07/12/2021 10:58 AM   GFR 46.86 (L) 12/26/2020 08:55 AM   MICROALBUR 64.1 (H) 06/23/2020 08:49 AM   MICROALBUR 49.0 (H) 05/14/2017 08:18 AM    Last  diabetic Eye exam:  No results found for: HMDIABEYEEXA  Last diabetic Foot exam:  No results found for: HMDIABFOOTEX   Lab Results  Component Value Date   CHOL 186 07/12/2021   HDL 47.40 07/12/2021   LDLCALC 118 (H) 07/12/2021   TRIG 105.0 07/12/2021   CHOLHDL 4 07/12/2021       Latest Ref Rng & Units 07/12/2021   10:58 AM 02/05/2021   10:23 AM 02/02/2021    8:27 AM  Hepatic Function  Total Protein 6.0 - 8.3 g/dL 7.4   7.6   7.0    Albumin 3.5 - 5.2 g/dL 4.3   3.9   3.7    AST 0 - 37 U/L _0 ALT 0 - 53 U/L _1 Alk Phosphatase 39 - 117 U/L 92   59   61    Total Bilirubin 0.2 - 1.2 mg/dL 0.5   0.9   0.8    Bilirubin, Direct 0.0 - 0.3 mg/dL 0.1        Lab Results  Component Value Date/Time   TSH 1.83 07/12/2021 10:58 AM   TSH 0.86 06/23/2020 08:49 AM       Latest Ref Rng & Units 07/12/2021   10:58 AM 02/06/2021    6:38 AM 02/05/2021   10:59 AM  CBC  WBC 4.0 - 10.5 K/uL 7.5   6.2     Hemoglobin 13.0 - 17.0 g/dL 15.2   16.1   18.0    Hematocrit 39.0 - 52.0 % 47.5   50.3   53.0    Platelets 150.0 - 400.0 K/uL 190.0   265       Lab Results  Component Value Date/Time   VD25OH 33.85 07/12/2021 10:58 AM   VD25OH 41.96 06/23/2020 08:49 AM    Clinical ASCVD: Yes  The ASCVD Risk score (Arnett DK, et al., 2019) failed to calculate for the following reasons:   The patient has a prior MI or stroke diagnosis       09/20/2021    4:17 PM 09/13/2021   10:18 AM 07/12/2021   10:33 AM  Depression screen PHQ 2/9  Decreased Interest 0 0 0  Down, Depressed, Hopeless 0 0 0  PHQ - 2 Score 0 0 0    Social History   Tobacco Use  Smoking Status Former   Packs/day: 0.50   Years: 30.00   Pack years: 15.00   Types: Cigarettes  Smokeless Tobacco Never  Tobacco Comments   trying to cut down   BP Readings from Last 3 Encounters:  10/27/21 130/80  09/30/21 (!) 176/112  09/13/21 140/88   Pulse Readings from Last 3 Encounters:  10/27/21 64  09/30/21 71   09/13/21 67   Wt Readings from Last 3 Encounters:  10/27/21 (!) 327 lb 3.2 oz (148.4 kg)  09/30/21 (!) 332 lb 14.3 oz (151 kg)  09/13/21 (!) 334 lb 8 oz (  151.7 kg)   BMI Readings from Last 3 Encounters:  10/27/21 43.17 kg/m  09/30/21 43.92 kg/m  09/13/21 44.13 kg/m    Assessment/Interventions: Review of patient past medical history, allergies, medications, health status, including review of consultants reports, laboratory and other test data, was performed as part of comprehensive evaluation and provision of chronic care management services.   SDOH:  (Social Determinants of Health) assessments and interventions performed: Yes  SDOH Screenings   Alcohol Screen: Low Risk    Last Alcohol Screening Score (AUDIT): 0  Depression (PHQ2-9): Low Risk    PHQ-2 Score: 0  Financial Resource Strain: Low Risk    Difficulty of Paying Living Expenses: Not hard at all  Food Insecurity: No Food Insecurity   Worried About Charity fundraiser in the Last Year: Never true   Ran Out of Food in the Last Year: Never true  Housing: Low Risk    Last Housing Risk Score: 0  Physical Activity: Inactive   Days of Exercise per Week: 0 days   Minutes of Exercise per Session: 0 min  Social Connections: Socially Isolated   Frequency of Communication with Friends and Family: More than three times a week   Frequency of Social Gatherings with Friends and Family: More than three times a week   Attends Religious Services: Never   Marine scientist or Organizations: No   Attends Music therapist: Never   Marital Status: Never married  Stress: No Stress Concern Present   Feeling of Stress : Not at all  Tobacco Use: Medium Risk   Smoking Tobacco Use: Former   Smokeless Tobacco Use: Never   Passive Exposure: Not on file  Transportation Needs: No Transportation Needs   Lack of Transportation (Medical): No   Lack of Transportation (Non-Medical): No    CCM Care Plan  Allergies   Allergen Reactions   Penicillin G Other (See Comments)    Did it involve swelling of the face/tongue/throat, SOB, or low BP?  N/A Did it involve sudden or severe rash/hives, skin peeling, or any reaction on the inside of your mouth or nose? N/A Did you need to seek medical attention at a hospital or doctor's office? N/A When did it last happen? Child If all above answers are "NO", may proceed with cephalosporin use.   Penicillins Other (See Comments)    Did it involve swelling of the face/tongue/throat, SOB, or low BP? N/A Did it involve sudden or severe rash/hives, skin peeling, or any reaction on the inside of your mouth or nose?N/A Did you need to seek medical attention at a hospital or doctor's office? N/A When did it last happen? Child      If all above answers are "NO", may proceed with cephalosporin use.    Medications Reviewed Today     Reviewed by Tomasa Blase, [facility] (Pharmacist) on 11/08/21 at 1503  Med List Status: <None>   Medication Order Taking? Sig Documenting Provider Last Dose Status Informant  Accu-Chek Softclix Lancets lancets [id number] Yes Use as instructed E11.9 Biagio Borg, MD Taking Active   albuterol (VENTOLIN HFA) 108 (90 Base) MCG/ACT inhaler [id number] Yes Inhale 2 puffs into the lungs every 6 (six) hours as needed for wheezing or shortness of breath. Biagio Borg, MD Taking Active   Alcohol Swabs (B-D SINGLE USE SWABS BUTTERFLY) PADS [id number] Yes Use up to 4 times a day to test blood sugars. Dx: E10.9, E11.9 Biagio Borg, MD Taking Active Self  aspirin  81 MG EC tablet [id number] Yes Take 81 mg by mouth daily. [provider] Taking Active   atorvastatin (LIPITOR) 80 MG tablet [id number] Yes Take 1 tablet (80 mg total) by mouth at bedtime. Sherren Mocha, MD Taking Active   Blood Glucose Calibration (TRUE METRIX LEVEL 1) Low SOLN [id number] Yes Use as needed. Dx: E10.9, E11.9 Biagio Borg, MD Taking Active Self  blood glucose meter kit and supplies  [id number] Yes Dispense per insurance preference. Use as directed once daily. (FOR ICD-10  E11.9). Biagio Borg, MD Taking Active   carvedilol (COREG) 25 MG tablet [id number] Yes TAKE 1 TABLET(25 MG) BY MOUTH TWICE DAILY WITH A MEAL Sherren Mocha, MD Taking Active   cetirizine (ZYRTEC) 10 MG tablet [id number] Yes TAKE 1 TABLET BY MOUTH DAILY Biagio Borg, MD Taking Active   cholecalciferol (VITAMIN D3) 25 MCG (1000 UNIT) tablet [id number] No Take 1,000 Units by mouth daily.  Patient not taking: Reported on 11/08/2021   [provider] Not Taking Active   clopidogrel (PLAVIX) 75 MG tablet [id number] Yes Take 1 tablet (75 mg total) by mouth daily. Regalado, Belkys A, MD Taking Active   empagliflozin (JARDIANCE) 25 MG TABS tablet [id number] Yes Take 1 tablet (25 mg total) by mouth daily before breakfast. Biagio Borg, MD Taking Active   gabapentin (NEURONTIN) 300 MG capsule [id number] Yes TAKE 1 CAPSULE(300 MG) BY MOUTH THREE TIMES DAILY  Patient taking differently: 300 mg 3 (three) times daily as needed.   Biagio Borg, MD Taking Active   glucose blood (ACCU-CHEK GUIDE) test strip [id number] Yes Use as instructed E11.9 Biagio Borg, MD Taking Active   glucose blood (TRUE METRIX BLOOD GLUCOSE TEST) test strip [id number] Yes Use to test blood sugars up to 4 times a day. DX: E10.9, E.11.9 Biagio Borg, MD Taking Active   hydrocortisone cream 1 % [id number] Yes Apply to affected area 2 times daily Sherwood Gambler, MD Taking Active Self  isosorbide mononitrate (IMDUR) 30 MG 24 hr tablet [id number] Yes TAKE 3 TABLETS(90 MG) BY MOUTH DAILY Biagio Borg, MD Taking Active   Lancets 33G MISC [id number] Yes Use to test blood sugars up to 4 times a day. DX: E10.9, E11.9 Biagio Borg, MD Taking Active   losartan (COZAAR) 100 MG tablet [id number] Yes Take 1 tablet (100 mg total) by mouth daily. Biagio Borg, MD Taking Active   nitroGLYCERIN (NITROSTAT) 0.4 MG SL tablet [id number]  PLACE 1 TABLET UNDER THE  TONGUE EVERY 5 MINUTES AS NEEDED FOR CHEST PAIN Biagio Borg, MD  Active   pantoprazole (PROTONIX) 40 MG tablet [id number] Yes Take 1 tablet (40 mg total) by mouth daily. Biagio Borg, MD Taking Active Self  PARoxetine (PAXIL) 20 MG tablet [id number] Yes TAKE 1 TABLET(20 MG) BY MOUTH DAILY Biagio Borg, MD Taking Active   potassium chloride (KLOR-CON) 10 MEQ tablet [id number] Yes TAKE 2 TABLETS(20 MEQ) BY MOUTH TWICE DAILY [name] T, PA-C Taking Active   Semaglutide,0.25 or 0.5MG/DOS, (OZEMPIC, 0.25 OR 0.5 MG/DOSE,) 2 MG/3ML SOPN [id number] Yes Take 0.5 mg subq once weekly Biagio Borg, MD Taking Active   spironolactone (ALDACTONE) 25 MG tablet [id number] Yes TAKE 1/2 TABLET(12.5 MG) BY MOUTH DAILY Sherren Mocha, MD Taking Active   torsemide (DEMADEX) 20 MG tablet [id number] Yes TAKE 2 TABLETS BY MOUTH IN THE A.M, TAKE 1 TABLET BY MOUTH IN THE P.M. X'S 3 DAYS THEN REDUCE IT TO TAKING 1 TABLET BY MOUTH TWICE  A DAY [name] T, PA-C Taking Active   traMADol Veatrice Bourbon) 50 MG tablet [id number] Yes 1 tab by mouth four times per day as needed Biagio Borg, MD Taking Active   vitamin B-12 (CYANOCOBALAMIN) 1000 MCG tablet [id number] No Take 1 tablet (1,000 mcg total) by mouth daily.  Patient not taking: Reported on 11/08/2021   Biagio Borg, MD Not Taking Active Self  Med List Note Eartha Inch, [place] 12/15/15 1042): CPAP            Patient Active Problem List   Diagnosis Date Noted   Asthma 09/17/2021   PVC's (premature ventricular contractions) 09/03/2021   SVT (supraventricular tachycardia) ([place]) 09/03/2021   History of TIA (transient ischemic attack) 02/05/2021   History of stroke 01/2021   Aortic atherosclerosis ([place]) 12/26/2020   Cough 12/26/2020   Vitamin D deficiency 04/03/2020   B12 deficiency 04/03/2020   Itching 11/22/2019   Chronic heart failure with preserved ejection fraction (HFpEF) ([place]) 05/13/2019   Elevated troponin 05/13/2019   Morbid obesity with BMI of 40.0-44.9,  adult ([place]) 05/13/2019   Pre-ulcerative calluses 02/03/2019   Foot injury, left, initial encounter 11/26/2018   Chest pain 05/30/2017   Hypokalemia 05/30/2017   Allergic rhinitis 12/18/2016   Lumbar radiculitis 09/05/2016   Lumbar spinal stenosis 09/05/2016   CKD (chronic kidney disease), stage III ([place]) 12/29/2015   Chest pain syndrome 12/15/2015   Acute kidney injury ([place]) 12/15/2015   Atypical chest pain 12/15/2015   Encounter for well adult exam with abnormal findings 06/27/2015   Chronic low back pain 06/27/2015   Left lumbar radiculopathy 04/13/2015   Bilateral plantar fasciitis 04/13/2015   Essential hypertension 03/09/2015   OSA (obstructive sleep apnea) 03/27/2014   Peripheral neuropathy ([place]) 12/29/2013   External hemorrhoid, thrombosed 12/03/2013   Sinus bradycardia 11/15/2013   Coronary artery disease involving native coronary artery of native heart without angina pectoris    Hyperlipidemia    DM2 (diabetes mellitus, type 2) ([place]) 06/26/2013   Gait disorder 06/26/2013   Obesity, Class III, BMI 40-49.9 (morbid obesity) ([place]) 09/10/2012   Former smoker 09/02/2012   Ischemic cardiomyopathy- EF 50-55% June 2015    Old MI (myocardial infarction) 09/01/2012   HEMORRHOIDS 12/26/2009   Acute prostatitis 12/26/2009   GANGLION CYST, WRIST, RIGHT 11/28/2009   COLONIC POLYPS 10/07/2009   Anxiety with depression 07/21/2009   DYSPNEA 03/24/2009   GASTROESOPHAGEAL REFLUX DISEASE 01/27/2007   PERCUTANEOUS TRANSLUMINAL CORONARY ANGIOPLASTY, HX OF 02/09/2006    Immunization History  Administered Date(s) Administered   Influenza Whole 04/12/2008, 04/12/2010, 02/10/2011   Influenza, Seasonal, Injecte, Preservative Fre 03/11/2013   Influenza,inj,Quad PF,6+ Mos 02/12/2015, 05/04/2016, 03/29/2017, 05/05/2018, 05/15/2019, 03/22/2020, 07/12/2021   Moderna Sars-Covid-2 Vaccination 11/03/2019, 12/01/2019, 06/20/2020   PFIZER(Purple Top)SARS-COV-2 Vaccination 11/10/2019   Pneumococcal  Conjugate-13 06/26/2013   Pneumococcal Polysaccharide-23 07/21/2009, 06/27/2015   Td 03/24/2009   Zoster Recombinat (Shingrix) 04/02/2017    Conditions to be addressed/monitored:  Hypertension, Hyperlipidemia, Diabetes, Heart Failure, Coronary Artery Disease, Chronic Kidney Disease, Depression, and Chronic Pain  Care Plan : CCM Pharmacy Care Plan  Updates made by Tomasa Blase, RPH since 11/08/2021 12:00 AM     Problem: Hypertension, Hyperlipidemia, Diabetes, Heart Failure, Coronary Artery Disease, Chronic Kidney Disease, Depression, and Chronic Pain   Priority: High  Onset Date: 05/08/2021     Long-Range Goal: Disease Management   Start Date: 05/08/2021  Expected End Date: 05/08/2022  This Visit's Progress: Not on track  Recent Progress: Not on track  Priority: High  Note:   Current Barriers:  Unable to independently monitor therapeutic efficacy Does not adhere to prescribed medication regimen  Pharmacist Clinical Goal(s):  Patient will achieve adherence to monitoring guidelines and medication adherence to achieve therapeutic efficacy achieve improvement in blood pressure and blood sugar control as evidenced by BP / BG logs through collaboration with PharmD and provider.   Interventions: 1:1 collaboration with Biagio Borg, MD regarding development and update of comprehensive plan of care as evidenced by provider attestation and co-signature Inter-disciplinary care team collaboration (see longitudinal plan of care) Comprehensive medication review performed; medication list updated in electronic medical record  Hyperlipidemia/Coronary Artery Disease / previous CVA: (LDL goal < 70) -Not ideally controlled - issues with adherence to medications  - since last appointment pt does report he has been better at taking every day  Lab Results  Component Value Date   [place] 118 (H) 07/12/2021  -Current treatment: Atorvastatin 71m - 1 tablet daily  Clopidogrel 741m- 1 tablet  daily  ASA 8131m 1 tablet daily  Nitroglycerin 0.4mg66m1 tablet every 5 minutes as needed for chest pain -Medications previously tried: pravastatin, rosuvastatin   -Current dietary patterns: notes that he avoids salting foods, but does admit to eating salty foods at times  -Current exercise habits: nothing at this time  -Educated on Cholesterol goals;  Benefits of statin for ASCVD risk reduction; Importance of limiting foods high in cholesterol; Exercise goal of 150 minutes per week; -Counseled on diet and exercise extensively Recommended to continue current medication - stressed importance of adherence to medications for improved outcomes   Diabetes (A1c goal <7%) -Not ideally controlled but improving - confirmed he has started using ozempic 0.5mg 61mkly, adherent with jardiance Lab Results  Component Value Date   HGBA1C 7.8 (H) 07/12/2021  -Current medications: Ozempic 0.5mg w26mly Jardiance 25mg- 44mblet daily  -Medications previously tried: glipizide XL, glimepiride, metformin, metformin XR  -Current home glucose readings fasting glucose: reports AM BG ~140 most day, can be lower - readings into the 90's  Post prandial: reports BG averaging 160-180 typically - does report he has been drinking soda more often since last appointment  -Reports hypoglycemic/hyperglycemic symptoms -Current meal patterns:  breakfast: egg, sausage - does not typically   lunch: small sandwich if he eats anything at all  dinner: hamburger, fries  snacks: chips drinks: Pepsi -  typically drinking 2 x 16oz daily  -Current exercise: nothing at this time  -Educated on A1c and blood sugar goals; Complications of diabetes including kidney damage, retinal damage, and cardiovascular disease; Exercise goal of 150 minutes per week; Prevention and management of hypoglycemic episodes; Benefits of routine self-monitoring of blood sugar; Carbohydrate counting and/or plate method -Counseled to check feet daily  and get yearly eye exams -Counseled on diet and exercise extensively - pt to reduce / stop soda intake  -Reviewed with patient BG targets, to continue to monitor and reach out should his BG above goal   Heart Failure (Goal: manage symptoms and prevent exacerbations)  / Hypertension (BP goal <140/90) -Controlled - per latest OV blood pressures - pt reports BP this AM was 182/113 - could not recall any other values - pt does not believe his home BP cuff is accurate and that he needs a new one  BP Readings from Last 3 Encounters:  10/27/21 130/80  09/30/21 (!) 176/112  09/13/21 140/88  -Last ejection fraction: 45-50% (Date: 02/06/2021) -HF type: Diastolic -NYHA Class: II (slight limitation  of activity) -Current treatment: Carvedilol 45m - 1 tablet twice daily  Losartan 1030m- 1 tablet daily  Torsemide 2080m 1 tablet twice daily  Potassium Chloride 80m20m 2 tablets twice daily  Jardiance 25mg97m tablet daily  Isosorbide mononitrate 30mg 86mablets daily  Spironolactone 25mg -48m tablet daily  -Medications previously tried: amlodipine, benazepril, clonidine, furosemide,   -Current home BP/HR readings: unknown at this time as patient did not have BP log -Current dietary habits: reports that he does not add salt to his foods but will each foods that can be high in sodium at times  -Current exercise habits: nothing at this time  -Educated on Benefits of medications for managing symptoms and prolonging life Importance of weighing daily; if you gain more than 3 pounds in one day or 5 pounds in one week, reach out to PCP/ Cardiology office Proper diuretic administration and potassium supplementation Importance of blood pressure control -Counseled on diet and exercise extensively Recommended to continue current medication - stressed to patient importance of adherence to medications - pt to reach out to office should BP average >140/90 when he is able to start checking again / starts recording  his blood pressures   Chronic Kidney Disease (Goal: Prevention of disease progression) -decreased since last visit -Last eGFR: 39.05 mL/min  -Current treatment  Avoidance of nephrotoxic agents, gain control of BP / BG to prevent further damage  -Medications previously tried: n/a  -Recommended to continue current medication   Depression (Goal: Promotion / maintenance of positive moode) -Controlled -Current treatment: Paroxetine 20mg - 49mblet daily  -Medications previously tried/failed: bupropion, citalopram, escitalopram -Educated on Benefits of medication for symptom control Benefits of cognitive-behavioral therapy with or without medication -Recommended to continue current medication  Chronic Pain / Peripheral neuropath / Lumbar radiculitis (Goal: Pain Management ) -Controlled -Current treatment  Tramadol 50mg - 16mlet every 6 hours as needed  Gabapentin 300mg - 1 33mule 2-3 times daily as needed  -Medications previously tried: APAP, norco, IBU, oxycodone  -Recommended to continue current medication  Health Maintenance -Vaccine gaps: Shingles, COVID booster, Influenza, Tdap -Current therapy:  Pantoprazole 40mg - 1 t27mt daily  Hydrocortisone 1% cream - applied twice daily  Vitamin B12 1000mcg -1 ta48m daily  Cetirizine 80mg - 1 tab21mdaily  -Educated on Cost vs benefit of each product must be carefully weighed by individual consumer -Patient is satisfied with current therapy and denies issues -Recommended for patient to complete COVID booster, Tdap, and influenza vaccine with next trip to pharmacy  - Shingles to be covered by insurance as of 2023  Patient Goals/Self-Care Activities Patient will:  - take medications as prescribed as evidenced by patient report and record review check glucose daily, document, and provide at future appointments check blood pressure daily, document, and provide at future appointments weigh daily, and contact provider if weight gain of  >3lbs in a day, or 5lbs in a week target a minimum of 150 minutes of moderate intensity exercise weekly engage in dietary modifications by reducing sodium intake / moderation of carb intake  Follow Up Plan: Telephone follow up appointment with care management team member scheduled for: 2 months The patient has been provided with contact information for the care management team and has been advised to call with any health related questions or concerns.     Medication Assistance: None required.  Patient affirms current coverage meets needs.  Care Gaps: COVID booster, Tdap, Shingles vaccine, foot exam  Patient's  preferred pharmacy is:  [id number] [address], [place] [address] Phone: [phone] Fax: [phone]  [place], [place] [address] Phone: [phone] Fax: [phone]  Uses pill box? No - has pill boxes, has not been using recently, is agreeable to restart  Pt endorses 75% compliance  Care Plan and Follow Up Patient Decision:  Patient agrees to Care Plan and Follow-up.  Plan: Telephone follow up appointment with care management team member scheduled for:  2 months The patient has been provided with contact information for the care management team and has been advised to call with any health related questions or concerns.   Tomasa Blase, PharmD Clinical Pharmacist, [place]

## 2021-11-15 ENCOUNTER — Ambulatory Visit (INDEPENDENT_AMBULATORY_CARE_PROVIDER_SITE_OTHER): Payer: Medicare Other | Admitting: *Deleted

## 2021-11-15 DIAGNOSIS — I5042 Chronic combined systolic (congestive) and diastolic (congestive) heart failure: Secondary | ICD-10-CM

## 2021-11-15 DIAGNOSIS — E1159 Type 2 diabetes mellitus with other circulatory complications: Secondary | ICD-10-CM

## 2021-11-15 NOTE — Chronic Care Management (AMB) (Signed)
Chronic Care Management   CCM RN Visit Note  11/15/2021 Name: [name] [name] MRN: [id number] DOB: 1955/08/29  Subjective: [name] [name] is a 66 y.o. year old male who is a primary care patient of Biagio Borg, MD. The care management team was consulted for assistance with disease management and care coordination needs.    Engaged with patient by telephone for follow up visit in response to provider referral for case management and/or care coordination services.   Consent to Services:  The patient was given information about Chronic Care Management services, agreed to services, and gave verbal consent prior to initiation of services.  Please see initial visit note for detailed documentation.  Patient agreed to services and verbal consent obtained.   Assessment: Review of patient past medical history, allergies, medications, health status, including review of consultants reports, laboratory and other test data, was performed as part of comprehensive evaluation and provision of chronic care management services.  CM Care Plan  Allergies  Allergen Reactions   Penicillin G Other (See Comments)    Did it involve swelling of the face/tongue/throat, SOB, or low BP?  N/A Did it involve sudden or severe rash/hives, skin peeling, or any reaction on the inside of your mouth or nose? N/A Did you need to seek medical attention at a hospital or doctor's office? N/A When did it last happen? Child If all above answers are "NO", may proceed with cephalosporin use.   Penicillins Other (See Comments)    Did it involve swelling of the face/tongue/throat, SOB, or low BP? N/A Did it involve sudden or severe rash/hives, skin peeling, or any reaction on the inside of your mouth or nose?N/A Did you need to seek medical attention at a hospital or doctor's office? N/A When did it last happen? Child      If all above answers are "NO", may proceed with cephalosporin use.   Outpatient Encounter Medications as of  11/15/2021  Medication Sig   Accu-Chek Softclix Lancets lancets Use as instructed E11.9   albuterol (VENTOLIN HFA) 108 (90 Base) MCG/ACT inhaler Inhale 2 puffs into the lungs every 6 (six) hours as needed for wheezing or shortness of breath.   Alcohol Swabs (B-D SINGLE USE SWABS BUTTERFLY) PADS Use up to 4 times a day to test blood sugars. Dx: E10.9, E11.9   aspirin 81 MG EC tablet Take 81 mg by mouth daily.   atorvastatin (LIPITOR) 80 MG tablet Take 1 tablet (80 mg total) by mouth at bedtime.   Blood Glucose Calibration (TRUE METRIX LEVEL 1) Low SOLN Use as needed. Dx: E10.9, E11.9   blood glucose meter kit and supplies Dispense per insurance preference. Use as directed once daily. (FOR ICD-10  E11.9).   carvedilol (COREG) 25 MG tablet TAKE 1 TABLET(25 MG) BY MOUTH TWICE DAILY WITH A MEAL   cetirizine (ZYRTEC) 10 MG tablet TAKE 1 TABLET BY MOUTH DAILY   cholecalciferol (VITAMIN D3) 25 MCG (1000 UNIT) tablet Take 1,000 Units by mouth daily. (Patient not taking: Reported on 11/08/2021)   clopidogrel (PLAVIX) 75 MG tablet Take 1 tablet (75 mg total) by mouth daily.   empagliflozin (JARDIANCE) 25 MG TABS tablet Take 1 tablet (25 mg total) by mouth daily before breakfast.   gabapentin (NEURONTIN) 300 MG capsule TAKE 1 CAPSULE(300 MG) BY MOUTH THREE TIMES DAILY (Patient taking differently: 300 mg 3 (three) times daily as needed.)   glucose blood (ACCU-CHEK GUIDE) test strip Use as instructed E11.9   glucose blood (TRUE METRIX BLOOD GLUCOSE  TEST) test strip Use to test blood sugars up to 4 times a day. DX: E10.9, E.11.9   hydrocortisone cream 1 % Apply to affected area 2 times daily   isosorbide mononitrate (IMDUR) 30 MG 24 hr tablet TAKE 3 TABLETS(90 MG) BY MOUTH DAILY   Lancets 33G MISC Use to test blood sugars up to 4 times a day. DX: E10.9, E11.9   losartan (COZAAR) 100 MG tablet Take 1 tablet (100 mg total) by mouth daily.   nitroGLYCERIN (NITROSTAT) 0.4 MG SL tablet PLACE 1 TABLET UNDER THE TONGUE  EVERY 5 MINUTES AS NEEDED FOR CHEST PAIN   pantoprazole (PROTONIX) 40 MG tablet Take 1 tablet (40 mg total) by mouth daily.   PARoxetine (PAXIL) 20 MG tablet TAKE 1 TABLET(20 MG) BY MOUTH DAILY   potassium chloride (KLOR-CON) 10 MEQ tablet TAKE 2 TABLETS(20 MEQ) BY MOUTH TWICE DAILY   Semaglutide,0.25 or 0.5MG /DOS, (OZEMPIC, 0.25 OR 0.5 MG/DOSE,) 2 MG/3ML SOPN Take 0.5 mg subq once weekly   spironolactone (ALDACTONE) 25 MG tablet TAKE 1/2 TABLET(12.5 MG) BY MOUTH DAILY   torsemide (DEMADEX) 20 MG tablet TAKE 2 TABLETS BY MOUTH IN THE A.M, TAKE 1 TABLET BY MOUTH IN THE P.M. X'S 3 DAYS THEN REDUCE IT TO TAKING 1 TABLET BY MOUTH TWICE A DAY   traMADol (ULTRAM) 50 MG tablet 1 tab by mouth four times per day as needed   vitamin B-12 (CYANOCOBALAMIN) 1000 MCG tablet Take 1 tablet (1,000 mcg total) by mouth daily. (Patient not taking: Reported on 11/08/2021)   No facility-administered encounter medications on file as of 11/15/2021.   Patient Active Problem List   Diagnosis Date Noted   Asthma 09/17/2021   PVC's (premature ventricular contractions) 09/03/2021   SVT (supraventricular tachycardia) ([place]) 09/03/2021   History of TIA (transient ischemic attack) 02/05/2021   History of stroke 01/2021   Aortic atherosclerosis ([place]) 12/26/2020   Cough 12/26/2020   Vitamin D deficiency 04/03/2020   B12 deficiency 04/03/2020   Itching 11/22/2019   Chronic heart failure with preserved ejection fraction (HFpEF) ([place]) 05/13/2019   Elevated troponin 05/13/2019   Morbid obesity with BMI of 40.0-44.9, adult ([place]) 05/13/2019   Pre-ulcerative calluses 02/03/2019   Foot injury, left, initial encounter 11/26/2018   Chest pain 05/30/2017   Hypokalemia 05/30/2017   Allergic rhinitis 12/18/2016   Lumbar radiculitis 09/05/2016   Lumbar spinal stenosis 09/05/2016   CKD (chronic kidney disease), stage III ([place]) 12/29/2015   Chest pain syndrome 12/15/2015   Acute kidney injury ([place]) 12/15/2015   Atypical chest pain  12/15/2015   Encounter for well adult exam with abnormal findings 06/27/2015   Chronic low back pain 06/27/2015   Left lumbar radiculopathy 04/13/2015   Bilateral plantar fasciitis 04/13/2015   Essential hypertension 03/09/2015   OSA (obstructive sleep apnea) 03/27/2014   Peripheral neuropathy ([place]) 12/29/2013   External hemorrhoid, thrombosed 12/03/2013   Sinus bradycardia 11/15/2013   Coronary artery disease involving native coronary artery of native heart without angina pectoris    Hyperlipidemia    DM2 (diabetes mellitus, type 2) ([place]) 06/26/2013   Gait disorder 06/26/2013   Obesity, Class III, BMI 40-49.9 (morbid obesity) ([place]) 09/10/2012   Former smoker 09/02/2012   Ischemic cardiomyopathy- EF 50-55% June 2015    Old MI (myocardial infarction) 09/01/2012   HEMORRHOIDS 12/26/2009   Acute prostatitis 12/26/2009   GANGLION CYST, WRIST, RIGHT 11/28/2009   COLONIC POLYPS 10/07/2009   Anxiety with depression 07/21/2009   DYSPNEA 03/24/2009   GASTROESOPHAGEAL REFLUX DISEASE 01/27/2007  PERCUTANEOUS TRANSLUMINAL CORONARY ANGIOPLASTY, HX OF 02/09/2006   Conditions to be addressed/monitored:  CHF and DMII  Care Plan : RN Care Manager Plan of Care  Updates made by Knox Royalty, RN since 11/15/2021 12:00 AM     Problem: Chronic Disease Management Needs   Priority: High     Long-Range Goal: Ongoing adherence to established plan of care for long term chronic disease management   Start Date: 01/25/2021  Expected End Date: 01/25/2022  Priority: High  Note:   Current Barriers:  Chronic Disease Management support and education needs related to CHF and DMII Literacy barriers- patient reports needs assistance understanding health care advice 08/31/21: confirms he has a friend who will assist with reading post-office visit instructions- will continue to mail AVS 05/29/21-- Has still not obtained glucometer/ scales from insurance provider: confirmed patient is working with [facility]  team for prescription needs: he reports he is "waiting" to receive glucometer after recent [facility] pharmacy visit 05/08/21 at which time, [facility] pharmacist requested new order/ prescription for glucometer 08/31/21: reports he has obtained and is using his glucometer at home for blood sugar monitoring 09/19/21: I have reached out multiple times to patient's cardiology provider to inquire about possibility of patient obtaining a scale from their office for use at home for daily weight monitoring (patient is away form his home throughout each day and does not want scales delivered due to possibility of porch pirates): provider has kindly agreed to provide/ instruct patient in use of scale, however, patient cancels and re-schedules his appointments often, making it difficult for cardiology provider to recall our prior conversations about this: patient still does not have scales, and today, I reminded patient of his upcoming (re-scheduled many times) cardiology provider appointment on Oct 27, 2021-- I encouraged patient to attend this appoint as scheduled and to [facility] the provider that he needs a scale to be provided to him for daily weight monitoring at home: patient verbalizes understanding and agreement 11/15/21: received confirmation from cardiology provider on 10/27/21 that scales were provided to patient at time of 10/27/21 cardiology provider appointment; confirmed patient has and is using scales 11/15/21: patient reports these barriers are now resolved Recent hospitalization (observation) for TIA August 28-29, 2022  RNCM Clinical Goal(s):  Patient will demonstrate improved adherence to prescribed treatment plan for CHF and DMII as evidenced by daily monitoring and recording of CBG  adherence to ADA/ carb modified diet adherence to prescribed medication regimen contacting provider for new or worsened symptoms or questions monitoring and recording daily weights at home    through collaboration with RN Care manager,  provider, and care team.   Interventions: 1:1 collaboration with primary care provider regarding development and update of comprehensive plan of care as evidenced by provider attestation and co-signature Inter-disciplinary care team collaboration (see longitudinal plan of care) Evaluation of current treatment plan related to  self management and patient's adherence to plan as established by provider Review of patient status, including review of consultants reports, relevant laboratory and other test results, and medications completed 11/15/21: scheduled call completed; patient reports he does not have much time to talk to me today, he is with a sick friend; reports he is "doing and feeling great," and denies current clinical concerns Confirmed patient obtained and is using scales for daily weight monitoring at home, he finally obtained scales from recent cardiology provider office visit 10/27/21; reinforced previously provided education around rationale for daily weights; signs/ symptoms yellow CHF zone along with corresponding action  plan; he does not have specific weights from home for our review today due to not being at home during our conversation Confirmed patient continues monitoring blood sugars at home; reports "no bad numbers" recently; does not have specific values for our review today Confirmed no medication concerns- he confirms he spoke with [facility] pharmacy "last week" and denies concerns around medications; continues to endorse taking medications as prescribed Confirmed patient is aware of upcoming scheduled PCP office visit 12/14/21- he is aware of appointment and verbalizes plans to attend as scheduled  From previous outreach 09/19/21 Reviewed recent PCP office visit 09/13/21- patient verbalizes fair understanding of post-office visit instructions and denies questions Confirms he obtained and is using rescue inhaler as prescribed during PCP office visit: reports using "at least once every  day" Medications discussed:  reports continues to independently self-manage and denies current concerns/ issues/ questions around medications; endorses adherence to taking all medications as prescribed Confirms he continues working with [facility] pharmacy team- states he has not heard back yet regarding the adherence packaging they are trying to arrange as of 09/05/21 visit Reviewed upcoming scheduled provider appointments: 09/20/21- [facility]/ PCP phone call; 09/22/21- sports medicine provider; 09/27/21- podiatry provider; 10/09/21- neurology provider; 10/27/21- cardiology provider; 11/08/21- [facility] Pharmacy team; patient confirms is aware of all and has plans to attend as scheduled Discussed plans with patient for ongoing care management follow up and provided patient with direct contact information for care management team        Heart Failure Interventions:  (Status: 11/15/21:  Goal on Track (progressing): YES.) Long Term Goal Basic overview and discussion of pathophysiology of Heart Failure reviewed Reviewed role of diuretics in prevention of fluid overload and management of heart failure Discussed the importance of keeping all appointments with provider Patient again reports does not have scales, does not monitor weights at home-- patient had previously scheduled cardiology office visit scheduled 09/04/21, which he cancelled and re-scheduled for 10/27/21-- I have reached out to cardiology provider several times to arrange patient obtaining scales for home monitoring, as he does not wish for home deliveries-- they are able to assist in getting patient scales, however, patient cancels/ re-schedules appointments so frequently, I suspect this may have been overlooked: today, I informed patient of the multiple effrts we have taken to try and get him scales for home monitoring, and I encouraged him to complete the cardiology office visit as it is currently scheduled in May-- and asked HIM to request scales for home monitoring when  he attends- he is agreeable to doing this Reinforced previously provided education around signs/ symptoms CHF yellow zone and corresponding action plan: he denies all today; states breathing "fine," and denies swelling in lower extremities/ abdomen: he continues to require ongoing reinforcement and support Denies recent issues around breathing status: continues walking regularly, reports tolerating well  Diabetes:  (Status: 11/15/21: Goal on Track (progressing): YES.) Long Term Goal Lab Results  Component Value Date   HGBA1C 7.8 (H) 07/12/2021  Provided education to patient about basic DM disease process; Confirmed patient now monitoring/ recording blood sugars at home twice a day-- positive reinforcement provided Reports general fasting blood sugar ranges between 160-170, unfortunately, he is not near his recorded values form home monitoring and he is only able to provide general values/ ranges Denies recent episodes/ signs/ symptoms hypoglycemia, since discontinuing glipizide as reported at time of our last outreach 08/31/21 Briefly reinforced previously provided education around signs/ symptoms hypoglycemia along with corresponding action plan; patient will need ongoing  support and reinforcement of same Confirmed "trying" to follow prescribed diet for HTN/ HLD/ DMII: reinforced dietary strategies for adherence  Patient Goals/Self-Care Activities: As evidenced by review of EHR, collaboration with care team, and patient reporting during [facility] RN CM outreach,    Patient [name] [name] will: Take medications as prescribed and continue to work with the Pharmacy team at Dr. Gwynn Burly office Attend all scheduled provider appointments Call pharmacy for medication refills Call provider office for new concerns or questions Great job checking your blood sugars at home! Keep up the great work! Be sure to write down the blood sugars from home on paper Check blood sugars first thing in the morning before eating:  the goal for these blood sugars is between 100-120 Check blood sugars later during the day 2 hours after eating a regular meal: the goal for these blood sugars is between 160-180 Continue to check your weights in the morning after you have emptied your bladder after sleeping- write the weights down on your calendar each day so you can remember them Call your heart doctor if you have overnight weight gain that is 3 lbs or more, or 5 lbs in one week- this is the earliest sign that you may be retaining fluid Call your heart doctor if you begin to experience swelling in your feet, ankles, legs, or abdomen, and/ or you have new or worse feelings of shortness of breath Continue to take precautions to prevent falls- use your cane as needed Continue to do your best to follow a heart healthy, low salt, low carbohydrate/ low sugar diet Please update your personal calendars to reflect your upcoming doctor appointments      Plan: Telephone follow up appointment with care management team member scheduled for: Monday, December 18, 2021 at 10:30 am The patient has been provided with contact information for the care management team and has been advised to call with any health related questions or concerns   [name], RN, BSN, [place] [phone]: direct office

## 2021-11-30 DIAGNOSIS — H401121 Primary open-angle glaucoma, left eye, mild stage: Secondary | ICD-10-CM | POA: Diagnosis not present

## 2021-11-30 DIAGNOSIS — H401113 Primary open-angle glaucoma, right eye, severe stage: Secondary | ICD-10-CM | POA: Diagnosis not present

## 2021-12-04 DIAGNOSIS — T161XXA Foreign body in right ear, initial encounter: Secondary | ICD-10-CM | POA: Diagnosis not present

## 2021-12-06 ENCOUNTER — Ambulatory Visit (INDEPENDENT_AMBULATORY_CARE_PROVIDER_SITE_OTHER): Payer: Medicare Other | Admitting: Podiatry

## 2021-12-06 ENCOUNTER — Encounter: Payer: Self-pay | Admitting: Podiatry

## 2021-12-06 DIAGNOSIS — N1831 Chronic kidney disease, stage 3a: Secondary | ICD-10-CM | POA: Diagnosis not present

## 2021-12-06 DIAGNOSIS — E1159 Type 2 diabetes mellitus with other circulatory complications: Secondary | ICD-10-CM | POA: Diagnosis not present

## 2021-12-06 DIAGNOSIS — M79674 Pain in right toe(s): Secondary | ICD-10-CM | POA: Diagnosis not present

## 2021-12-06 DIAGNOSIS — M79675 Pain in left toe(s): Secondary | ICD-10-CM

## 2021-12-06 DIAGNOSIS — B351 Tinea unguium: Secondary | ICD-10-CM | POA: Diagnosis not present

## 2021-12-06 DIAGNOSIS — L84 Corns and callosities: Secondary | ICD-10-CM | POA: Diagnosis not present

## 2021-12-06 NOTE — Progress Notes (Signed)
This patient returns to my office for at risk foot care.  This patient requires this care by a professional since this patient will be at risk due to having pre-ulcerative calluses, CKD and diabetic neuropathy and coagulation defect.  Patient is taking plavix.  This patient is unable to cut nails himself since the patient cannot reach his nails.These nails are painful walking and wearing shoes.  This patient presents for at risk foot care today.    General Appearance  Alert, conversant and in no acute stress.  Vascular  Dorsalis pedis and posterior tibial  pulses are  weakly  palpable  bilaterally.  Capillary return is within normal limits  bilaterally. Temperature is within normal limits  bilaterally.  Neurologic  Senn-Weinstein monofilament wire test diminished   bilaterally. Muscle power within normal limits bilaterally.  Nails Thick disfigured discolored nails with subungual debris  from hallux to fifth toes bilaterally. No evidence of bacterial infection or drainage bilaterally.  Orthopedic  No limitations of motion  feet .  No crepitus or effusions noted.  No bony pathology or digital deformities noted.  Plantar flexed second metatarsal  B/L.  Skin  normotropic skin  noted bilaterally.  No signs of infections or ulcers noted.   Pre-ulcerous callus sub 2  B/L.  Porokeratosis sub 5th  Left.  Onychomycosis  Pain in right toes  Pain in left toes  Porokeratosis/Pre-ulcerous callus  B/L.  Consent was obtained for treatment procedures.   Mechanical debridement of nails 1-5  bilaterally performed with a nail nipper.  Filed with dremel without incident. Porokeratosis debrided with # 15 blade.    Return office visit     3 months                Told patient to return for periodic foot care and evaluation due to potential at risk complications.   Gardiner Barefoot DPM

## 2021-12-08 DIAGNOSIS — E1159 Type 2 diabetes mellitus with other circulatory complications: Secondary | ICD-10-CM | POA: Diagnosis not present

## 2021-12-08 DIAGNOSIS — I509 Heart failure, unspecified: Secondary | ICD-10-CM | POA: Diagnosis not present

## 2021-12-14 ENCOUNTER — Ambulatory Visit (INDEPENDENT_AMBULATORY_CARE_PROVIDER_SITE_OTHER): Payer: Medicare Other | Admitting: Internal Medicine

## 2021-12-14 ENCOUNTER — Encounter: Payer: Self-pay | Admitting: Internal Medicine

## 2021-12-14 VITALS — BP 136/90 | HR 84 | Temp 98.4°F | Ht 73.0 in | Wt 335.4 lb

## 2021-12-14 DIAGNOSIS — Z79899 Other long term (current) drug therapy: Secondary | ICD-10-CM

## 2021-12-14 DIAGNOSIS — E559 Vitamin D deficiency, unspecified: Secondary | ICD-10-CM

## 2021-12-14 DIAGNOSIS — G8929 Other chronic pain: Secondary | ICD-10-CM | POA: Diagnosis not present

## 2021-12-14 DIAGNOSIS — H6122 Impacted cerumen, left ear: Secondary | ICD-10-CM

## 2021-12-14 DIAGNOSIS — E1159 Type 2 diabetes mellitus with other circulatory complications: Secondary | ICD-10-CM

## 2021-12-14 DIAGNOSIS — M545 Low back pain, unspecified: Secondary | ICD-10-CM

## 2021-12-14 LAB — LIPID PANEL
Cholesterol: 131 mg/dL (ref 0–200)
HDL: 46.3 mg/dL (ref >39.00)
LDL Cholesterol: 74 mg/dL (ref 0–99)
NonHDL: 84.85
Total CHOL/HDL Ratio: 3
Triglycerides: 53 mg/dL (ref 0.0–149.0)
VLDL: 10.6 mg/dL (ref 0.0–40.0)

## 2021-12-14 LAB — CBC WITH DIFFERENTIAL/PLATELET
Basophils Absolute: 0 10*3/uL (ref 0.0–0.1)
Basophils Relative: 0.6 % (ref 0.0–3.0)
Eosinophils Absolute: 0.3 10*3/uL (ref 0.0–0.7)
Eosinophils Relative: 4.9 % (ref 0.0–5.0)
HCT: 45.8 % (ref 39.0–52.0)
Hemoglobin: 14.9 g/dL (ref 13.0–17.0)
Lymphocytes Relative: 52.8 % — ABNORMAL HIGH (ref 12.0–46.0)
Lymphs Abs: 3.2 10*3/uL (ref 0.7–4.0)
MCHC: 32.5 g/dL (ref 30.0–36.0)
MCV: 89 fl (ref 78.0–100.0)
Monocytes Absolute: 0.6 10*3/uL (ref 0.1–1.0)
Monocytes Relative: 10.5 % (ref 3.0–12.0)
Neutro Abs: 1.9 10*3/uL (ref 1.4–7.7)
Neutrophils Relative %: 31.2 % — ABNORMAL LOW (ref 43.0–77.0)
Platelets: 211 10*3/uL (ref 150.0–400.0)
RBC: 5.15 Mil/uL (ref 4.22–5.81)
RDW: 16.3 % — ABNORMAL HIGH (ref 11.5–15.5)
WBC: 6 10*3/uL (ref 4.0–10.5)

## 2021-12-14 LAB — HEPATIC FUNCTION PANEL
ALT: 14 U/L (ref 0–53)
AST: 17 U/L (ref 0–37)
Albumin: 4.2 g/dL (ref 3.5–5.2)
Alkaline Phosphatase: 82 U/L (ref 39–117)
Bilirubin, Direct: 0.1 mg/dL (ref 0.0–0.3)
Total Bilirubin: 0.6 mg/dL (ref 0.2–1.2)
Total Protein: 7.3 g/dL (ref 6.0–8.3)

## 2021-12-14 LAB — BASIC METABOLIC PANEL
BUN: 19 mg/dL (ref 6–23)
CO2: 31 mEq/L (ref 19–32)
Calcium: 9.7 mg/dL (ref 8.4–10.5)
Chloride: 104 mEq/L (ref 96–112)
Creatinine, Ser: 1.7 mg/dL — ABNORMAL HIGH (ref 0.40–1.50)
GFR: 41.98 mL/min — ABNORMAL LOW (ref >60.00)
Glucose, Bld: 131 mg/dL — ABNORMAL HIGH (ref 70–99)
Potassium: 3.9 mEq/L (ref 3.5–5.1)
Sodium: 141 mEq/L (ref 135–145)

## 2021-12-14 LAB — VITAMIN D 25 HYDROXY (VIT D DEFICIENCY, FRACTURES): VITD: 30.15 ng/mL (ref 30.00–100.00)

## 2021-12-14 LAB — VITAMIN B12: Vitamin B-12: 251 pg/mL (ref 211–911)

## 2021-12-14 LAB — TSH: TSH: 1.93 u[IU]/mL (ref 0.35–5.50)

## 2021-12-14 LAB — HEMOGLOBIN A1C: Hgb A1c MFr Bld: 7.6 % — ABNORMAL HIGH (ref 4.6–6.5)

## 2021-12-14 MED ORDER — SEMAGLUTIDE (1 MG/DOSE) 4 MG/3ML ~~LOC~~ SOPN: 1.0000 mg | PEN_INJECTOR | SUBCUTANEOUS | 3 refills | Status: DC

## 2021-12-14 NOTE — Progress Notes (Signed)
PRE-PROCEDURE EXAM: Left TM cannot be visualized due to total occlusion/impaction of the ear canal.  PROCEDURE INDICATION: remove wax to visualize ear drum & relieve discomfort  CONSENT:  Verbal     PROCEDURE NOTE:    Left ear:  I used a plastic wax curette under direct vision with an otoscope to free the wax bolus from the ear wall and then successfully removed a small bit of wax. The ear was then irrigated with warm water to remove the remaining wax.  POST- PROCEDURE EXAM: left TM successfully visualized and found to have no erythema     The patient tolerated the procedure well.

## 2021-12-14 NOTE — Progress Notes (Signed)
Patient ID: [name] [name], male   DOB: 26-Feb-1956, 66 y.o.   MRN: [id number]        Chief Complaint: follow up HTN, HLD and DM, obesity       HPI:  [name] [name] is a 66 y.o. male here with c/o Pt denies chest pain, increased sob or doe, wheezing, orthopnea, PND, increased LE swelling, palpitations, dizziness or syncope.   Pt denies polydipsia, polyuria, or new focal neuro s/s.    Pt denies fever, wt loss, night sweats, loss of appetite, or other constitutional symptoms  In fact has been gaining wt but toleraing lower dose ozemipc.  CBGs in low 100's.  Also has one week onset left ear reduced hearing wihtout HA,, fever, vertigo, tinnitus or drainage.  Pt continues to have recurring LBP without change in severity, bowel or bladder change, fever, wt loss,  worsening LE pain/numbness/weakness, gait change or falls.     Wt Readings from Last 3 Encounters:  12/14/21 (!) 335 lb 6 oz (152.1 kg)  10/27/21 (!) 327 lb 3.2 oz (148.4 kg)  09/30/21 (!) 332 lb 14.3 oz (151 kg)   BP Readings from Last 3 Encounters:  12/14/21 136/90  10/27/21 130/80  09/30/21 (!) 176/112         Past Medical History:  Diagnosis Date   CAD (coronary artery disease)    a. s/p BMS pRCA 2002; b. DES to pRCA & DES p/m RCA 2006; c. PCI/DES OM4 2007; d. PCI/CBA to RCA for ISR 10/2006; e.08/2012 STEMI/Cath/PCI:  LAD 95% >> PCI: Promus Prem DES  //  f. NSTEMI 10/15 >> LHC: mLAD stent ok, dLAD 70, OM1 CTO, OM2 stent ok, dOM2 90, OM3 30-40, dLCx 90, p-mRCA stent ok, mRCA stent ok w/ 60-70 ISR, EF 50% >> med Rx // MV 1/17: no ischemia // Cath ([facility]) 05/2019: RCA 100 (CTO)    Combined systolic and diastolic CHF    a. Myoview 1/17: EF 35%  //  b. Echo 1/17 EF 45-50% // Echo 05/2019: EF 50-55 // Echocardiogram 8/21: EF 50, inf AK, post and mid inf HK, mod LVH, RVSP 25.3, mild to mod LAE, trivial MR    Depression    Diabetes mellitus ([place])    a. A1c 8.8 08/2012->Metformin initiated. => b. A1c (9/14): 6.6   Gastroesophageal reflux disease     History of nuclear stress test    a. Myoview 1/17: EF 35%, fixed inferior lateral defect consistent with infarct, no ischemia, intermediate risk   History of pneumonia    History of stroke 01/2021   Zio Monitor 11/22: NSR, avg HR 61; PVCs (1.9%); no AFib, no high grade HB, no ventricular arrhythmias   HTN (hypertension)    Hx of NSTEMI    Hyperlipidemia    Ischemic cardiomyopathy    a. EF 40%; improved to normal;  b. 08/2012 Echo: EF 50-55%, mod LVH.//  c. Echo 9/16: Inf HK, mild LVH, EF 55%, mild LAE, normal RVF, mild RAE, PASP 35 mmHg  //  d. Echo 1/17: EF 45-50%, inferior HK, mild BAE, PASP 33 mmHg   Obesity    OSA (obstructive sleep apnea)    Does not use CPAP as of 05/2011   Tobacco abuse    Past Surgical History:  Procedure Laterality Date   CORONARY ANGIOPLASTY WITH STENT PLACEMENT     CORONARY STENT PLACEMENT  2009   LEFT HEART CATHETERIZATION WITH CORONARY ANGIOGRAM N/A 08/31/2012   Procedure: LEFT HEART CATHETERIZATION WITH CORONARY ANGIOGRAM;  Surgeon: Annita Brod  Angelena Form, MD;  Location: [place] CATH LAB;  Service: Cardiovascular;  Laterality: N/A;   LEFT HEART CATHETERIZATION WITH CORONARY ANGIOGRAM N/A 11/16/2013   Procedure: LEFT HEART CATHETERIZATION WITH CORONARY ANGIOGRAM;  Surgeon: Blane Ohara, MD;  Location: [facility] CATH LAB;  Service: Cardiovascular;  Laterality: N/A;   LEFT HEART CATHETERIZATION WITH CORONARY ANGIOGRAM N/A 03/15/2014   Procedure: LEFT HEART CATHETERIZATION WITH CORONARY ANGIOGRAM;  Surgeon: Peter M Martinique, MD;  Location: [facility] CATH LAB;  Service: Cardiovascular;  Laterality: N/A;   PERCUTANEOUS CORONARY STENT INTERVENTION (PCI-S) Right 08/31/2012   Procedure: PERCUTANEOUS CORONARY STENT INTERVENTION (PCI-S);  Surgeon: Burnell Blanks, MD;  Location: [facility] CATH LAB;  Service: Cardiovascular;  Laterality: Right;   PERCUTANEOUS CORONARY STENT INTERVENTION (PCI-S)  11/16/2013   Procedure: PERCUTANEOUS CORONARY STENT INTERVENTION (PCI-S);  Surgeon: Blane Ohara, MD;  Location: [facility] CATH LAB;  Service: Cardiovascular;;    reports that he has quit smoking. His smoking use included cigarettes. He has a 15.00 pack-year smoking history. He has never used smokeless tobacco. He reports that he does not drink alcohol and does not use drugs. family history includes Cancer in his mother; Coronary artery disease in an other family member; Depression in his mother; Hypertension in his mother; Other in his father. Allergies  Allergen Reactions   Penicillin G Other (See Comments)    Did it involve swelling of the face/tongue/throat, SOB, or low BP?  N/A Did it involve sudden or severe rash/hives, skin peeling, or any reaction on the inside of your mouth or nose? N/A Did you need to seek medical attention at a hospital or doctor's office? N/A When did it last happen? Child If all above answers are "NO", may proceed with cephalosporin use.   Penicillins Other (See Comments)    Did it involve swelling of the face/tongue/throat, SOB, or low BP? N/A Did it involve sudden or severe rash/hives, skin peeling, or any reaction on the inside of your mouth or nose?N/A Did you need to seek medical attention at a hospital or doctor's office? N/A When did it last happen? Child      If all above answers are "NO", may proceed with cephalosporin use.   Current Outpatient Medications on File Prior to Visit  Medication Sig Dispense Refill   Accu-Chek Softclix Lancets lancets Use as instructed E11.9 100 each 12   albuterol (VENTOLIN HFA) 108 (90 Base) MCG/ACT inhaler Inhale 2 puffs into the lungs every 6 (six) hours as needed for wheezing or shortness of breath. 8 g 11   Alcohol Swabs (B-D SINGLE USE SWABS BUTTERFLY) PADS Use up to 4 times a day to test blood sugars. Dx: E10.9, E11.9 100 each 11   aspirin 81 MG EC tablet Take 81 mg by mouth daily.     atorvastatin (LIPITOR) 80 MG tablet Take 1 tablet (80 mg total) by mouth at bedtime. 90 tablet 2   Blood Glucose Calibration  (TRUE METRIX LEVEL 1) Low SOLN Use as needed. Dx: E10.9, E11.9 1 each 0   blood glucose meter kit and supplies Dispense per insurance preference. Use as directed once daily. (FOR ICD-10  E11.9). 1 each 0   carvedilol (COREG) 25 MG tablet TAKE 1 TABLET(25 MG) BY MOUTH TWICE DAILY WITH A MEAL 180 tablet 3   cholecalciferol (VITAMIN D3) 25 MCG (1000 UNIT) tablet Take 1,000 Units by mouth daily.     clopidogrel (PLAVIX) 75 MG tablet Take 1 tablet (75 mg total) by mouth daily. 90 tablet 3  empagliflozin (JARDIANCE) 25 MG TABS tablet Take 1 tablet (25 mg total) by mouth daily before breakfast. 100 tablet 3   gabapentin (NEURONTIN) 300 MG capsule TAKE 1 CAPSULE(300 MG) BY MOUTH THREE TIMES DAILY (Patient taking differently: 300 mg 3 (three) times daily as needed.) 270 capsule 1   glucose blood (ACCU-CHEK GUIDE) test strip Use as instructed E11.9 100 each 12   glucose blood (TRUE METRIX BLOOD GLUCOSE TEST) test strip Use to test blood sugars up to 4 times a day. DX: E10.9, E.11.9 100 each 12   isosorbide mononitrate (IMDUR) 30 MG 24 hr tablet TAKE 3 TABLETS(90 MG) BY MOUTH DAILY 270 tablet 3   Lancets 33G MISC Use to test blood sugars up to 4 times a day. DX: E10.9, E11.9 100 each 6   losartan (COZAAR) 100 MG tablet Take 1 tablet (100 mg total) by mouth daily. 100 tablet 3   nitroGLYCERIN (NITROSTAT) 0.4 MG SL tablet PLACE 1 TABLET UNDER THE TONGUE EVERY 5 MINUTES AS NEEDED FOR CHEST PAIN 25 tablet 5   pantoprazole (PROTONIX) 40 MG tablet Take 1 tablet (40 mg total) by mouth daily. 90 tablet 3   PARoxetine (PAXIL) 20 MG tablet TAKE 1 TABLET(20 MG) BY MOUTH DAILY 90 tablet 1   potassium chloride (KLOR-CON) 10 MEQ tablet TAKE 2 TABLETS(20 MEQ) BY MOUTH TWICE DAILY 360 tablet 2   spironolactone (ALDACTONE) 25 MG tablet TAKE 1/2 TABLET(12.5 MG) BY MOUTH DAILY 45 tablet 3   torsemide (DEMADEX) 20 MG tablet TAKE 2 TABLETS BY MOUTH IN THE A.M, TAKE 1 TABLET BY MOUTH IN THE P.M. X'S 3 DAYS THEN REDUCE IT TO TAKING  1 TABLET BY MOUTH TWICE A DAY 180 tablet 3   traMADol (ULTRAM) 50 MG tablet 1 tab by mouth four times per day as needed 120 tablet 2   cetirizine (ZYRTEC) 10 MG tablet TAKE 1 TABLET BY MOUTH DAILY (Patient not taking: Reported on 12/14/2021) 90 tablet 3   hydrocortisone cream 1 % Apply to affected area 2 times daily (Patient not taking: Reported on 12/14/2021) 15 g 0   vitamin B-12 (CYANOCOBALAMIN) 1000 MCG tablet Take 1 tablet (1,000 mcg total) by mouth daily. (Patient not taking: Reported on 12/14/2021) 90 tablet 3   No current facility-administered medications on file prior to visit.        ROS:  All others reviewed and negative.  Objective        PE:  BP 136/90   Pulse 84   Temp 98.4 F (36.9 C) (Oral)   Ht _0  (1.854 m)   Wt (!) 335 lb 6 oz (152.1 kg)   SpO2 92%   BMI 44.25 kg/m                 Constitutional: Pt appears in NAD               HENT: Head: NCAT.                Right Ear: External ear normal.                 Left Ear: External ear normal.  Left canal wax impaction irrigated with hearing improved               Eyes: . Pupils are equal, round, and reactive to light. Conjunctivae and EOM are normal               Nose: without d/c or deformity  Neck: Neck supple. Gross normal ROM               Cardiovascular: Normal rate and regular rhythm.                 Pulmonary/Chest: Effort normal and breath sounds without rales or wheezing.                Abd:  Soft, NT, ND, + BS, no organomegaly               Neurological: Pt is alert. At baseline orientation, motor grossly intact               Skin: Skin is warm. No rashes, no other new lesions, LE edema - none               Psychiatric: Pt behavior is normal without agitation   Micro: none  Cardiac tracings I have personally interpreted today:  none  Pertinent Radiological findings (summarize): none   Lab Results  Component Value Date   WBC 6.0 12/14/2021   HGB 14.9 12/14/2021   HCT 45.8 12/14/2021   PLT  211.0 12/14/2021   GLUCOSE 131 (H) 12/14/2021   CHOL 131 12/14/2021   TRIG 53.0 12/14/2021   HDL 46.30 12/14/2021   LDLCALC 74 12/14/2021   ALT 14 12/14/2021   AST 17 12/14/2021   NA 141 12/14/2021   K 3.9 12/14/2021   CL 104 12/14/2021   CREATININE 1.70 (H) 12/14/2021   BUN 19 12/14/2021   CO2 31 12/14/2021   TSH 1.93 12/14/2021   PSA 1.00 07/12/2021   INR 1.0 02/05/2021   HGBA1C 7.6 (H) 12/14/2021   MICROALBUR 64.1 (H) 06/23/2020   Assessment/Plan:  Neely Cecena is a 66 y.o. Black or African American [2] male with  has a past medical history of CAD (coronary artery disease), Combined systolic and diastolic CHF, Depression, Diabetes mellitus ([place]), Gastroesophageal reflux disease, History of nuclear stress test, History of pneumonia, History of stroke (01/2021), HTN (hypertension), Hx of NSTEMI, Hyperlipidemia, Ischemic cardiomyopathy, Obesity, OSA (obstructive sleep apnea), and Tobacco abuse.  Hearing loss of left ear due to cerumen impaction Left canal wax impaction irrigated with hearing improved  Ceruminosis is noted.  Wax is removed by syringing and manual debridement. Instructions for home care to prevent wax buildup are given.   Chronic low back pain Stable overall, for pain med refill  Vitamin D deficiency Last vitamin D Lab Results  Component Value Date   VD25OH 30.15 12/14/2021   Low, to start oral replacement   DM2 (diabetes mellitus, type 2) ([facility]) Lab Results  Component Value Date   HGBA1C 7.6 (H) 12/14/2021   uncontrolled, pt to continue current medical treatment jardiance 25 mg qd, and increased ozempic 1 mg weekly   Followup: Return in about 6 months (around 06/16/2022).  Cathlean Cower, MD 12/15/2021 9:30 PM [facility]

## 2021-12-14 NOTE — Patient Instructions (Signed)
Ok to increase the ozempic to the 1 mg per week  Your left ear was irrigated today of wax as well  Please continue all other medications as before, and refills have been done if requested.  Please have the pharmacy call with any other refills you may need.  Please continue your efforts at being more active, low cholesterol diet, and weight control  Please keep your appointments with your specialists as you may have planned  Please go to the LAB at the blood drawing area for the tests to be done  You will be contacted by phone if any changes need to be made immediately.  Otherwise, you will receive a letter about your results with an explanation, but please check with MyChart first.  Please make an Appointment to return in 6 months, or sooner if needed

## 2021-12-15 ENCOUNTER — Encounter: Payer: Self-pay | Admitting: Internal Medicine

## 2021-12-15 DIAGNOSIS — H6122 Impacted cerumen, left ear: Secondary | ICD-10-CM | POA: Insufficient documentation

## 2021-12-15 LAB — URINALYSIS, ROUTINE W REFLEX MICROSCOPIC
Bilirubin Urine: NEGATIVE
Hgb urine dipstick: NEGATIVE
Ketones, ur: NEGATIVE
Leukocytes,Ua: NEGATIVE
Nitrite: NEGATIVE
RBC / HPF: NONE SEEN (ref 0–?)
Specific Gravity, Urine: <=1.005 — AB (ref 1.000–1.030)
Total Protein, Urine: NEGATIVE
Urine Glucose: >=1000 — AB
Urobilinogen, UA: 0.2 (ref 0.0–1.0)
pH: 6 (ref 5.0–8.0)

## 2021-12-15 NOTE — Assessment & Plan Note (Signed)
Lab Results  Component Value Date   HGBA1C 7.6 (H) 12/14/2021   uncontrolled, pt to continue current medical treatment jardiance 25 mg qd, and increased ozempic 1 mg weekly

## 2021-12-15 NOTE — Assessment & Plan Note (Signed)
Last vitamin D Lab Results  Component Value Date   VD25OH 30.15 12/14/2021   Low, to start oral replacement

## 2021-12-15 NOTE — Assessment & Plan Note (Signed)
Left canal wax impaction irrigated with hearing improved  Ceruminosis is noted.  Wax is removed by syringing and manual debridement. Instructions for home care to prevent wax buildup are given.

## 2021-12-15 NOTE — Assessment & Plan Note (Signed)
Stable overall, for pain med refill

## 2021-12-18 ENCOUNTER — Ambulatory Visit (INDEPENDENT_AMBULATORY_CARE_PROVIDER_SITE_OTHER): Payer: Medicare Other | Admitting: *Deleted

## 2021-12-18 DIAGNOSIS — I5042 Chronic combined systolic (congestive) and diastolic (congestive) heart failure: Secondary | ICD-10-CM

## 2021-12-18 DIAGNOSIS — E1159 Type 2 diabetes mellitus with other circulatory complications: Secondary | ICD-10-CM

## 2021-12-18 NOTE — Chronic Care Management (AMB) (Signed)
Chronic Care Management   CCM RN Visit Note  12/18/2021 Name: [name] [name] MRN: [id number] DOB: [birth year]  Subjective: [name] [name] is a 66 y.o. year old male who is a primary care patient of Biagio Borg, MD. The care management team was consulted for assistance with disease management and care coordination needs.    Engaged with patient by telephone for follow up visit/ CCM RN CM case closure in response to provider referral for case management and/or care coordination services.   Consent to Services:  The patient was given information about Chronic Care Management services, agreed to services, and gave verbal consent prior to initiation of services.  Please see initial visit note for detailed documentation.  Patient agreed to services and verbal consent obtained.   Assessment: Review of patient past medical history, allergies, medications, health status, including review of consultants reports, laboratory and other test data, was performed as part of comprehensive evaluation and provision of chronic care management services.   SDOH (Social Determinants of Health) assessments and interventions performed:  SDOH Interventions    Flowsheet Row Most Recent Value  SDOH Interventions   Food Insecurity Interventions Intervention Not Indicated  [denies food insecurity]  Transportation Interventions Intervention Not Indicated  [reports using insurance benefit for transportation,  also occasionally uses a friend's vehicle]     CCM Care Plan  Allergies  Allergen Reactions   Penicillin G Other (See Comments)    Did it involve swelling of the face/tongue/throat, SOB, or low BP?  N/A Did it involve sudden or severe rash/hives, skin peeling, or any reaction on the inside of your mouth or nose? N/A Did you need to seek medical attention at a hospital or doctor's office? N/A When did it last happen? Child If all above answers are "NO", may proceed with cephalosporin use.   Penicillins Other  (See Comments)    Did it involve swelling of the face/tongue/throat, SOB, or low BP? N/A Did it involve sudden or severe rash/hives, skin peeling, or any reaction on the inside of your mouth or nose?N/A Did you need to seek medical attention at a hospital or doctor's office? N/A When did it last happen? Child      If all above answers are "NO", may proceed with cephalosporin use.   Outpatient Encounter Medications as of 12/18/2021  Medication Sig   Accu-Chek Softclix Lancets lancets Use as instructed E11.9   albuterol (VENTOLIN HFA) 108 (90 Base) MCG/ACT inhaler Inhale 2 puffs into the lungs every 6 (six) hours as needed for wheezing or shortness of breath.   Alcohol Swabs (B-D SINGLE USE SWABS BUTTERFLY) PADS Use up to 4 times a day to test blood sugars. Dx: E10.9, E11.9   aspirin 81 MG EC tablet Take 81 mg by mouth daily.   atorvastatin (LIPITOR) 80 MG tablet Take 1 tablet (80 mg total) by mouth at bedtime.   Blood Glucose Calibration (TRUE METRIX LEVEL 1) Low SOLN Use as needed. Dx: E10.9, E11.9   blood glucose meter kit and supplies Dispense per insurance preference. Use as directed once daily. (FOR ICD-10  E11.9).   carvedilol (COREG) 25 MG tablet TAKE 1 TABLET(25 MG) BY MOUTH TWICE DAILY WITH A MEAL   cetirizine (ZYRTEC) 10 MG tablet TAKE 1 TABLET BY MOUTH DAILY (Patient not taking: Reported on 12/14/2021)   cholecalciferol (VITAMIN D3) 25 MCG (1000 UNIT) tablet Take 1,000 Units by mouth daily.   clopidogrel (PLAVIX) 75 MG tablet Take 1 tablet (75 mg total) by mouth daily.  empagliflozin (JARDIANCE) 25 MG TABS tablet Take 1 tablet (25 mg total) by mouth daily before breakfast.   gabapentin (NEURONTIN) 300 MG capsule TAKE 1 CAPSULE(300 MG) BY MOUTH THREE TIMES DAILY (Patient taking differently: 300 mg 3 (three) times daily as needed.)   glucose blood (ACCU-CHEK GUIDE) test strip Use as instructed E11.9   glucose blood (TRUE METRIX BLOOD GLUCOSE TEST) test strip Use to test blood sugars up  to 4 times a day. DX: E10.9, E.11.9   hydrocortisone cream 1 % Apply to affected area 2 times daily (Patient not taking: Reported on 12/14/2021)   isosorbide mononitrate (IMDUR) 30 MG 24 hr tablet TAKE 3 TABLETS(90 MG) BY MOUTH DAILY   Lancets 33G MISC Use to test blood sugars up to 4 times a day. DX: E10.9, E11.9   losartan (COZAAR) 100 MG tablet Take 1 tablet (100 mg total) by mouth daily.   nitroGLYCERIN (NITROSTAT) 0.4 MG SL tablet PLACE 1 TABLET UNDER THE TONGUE EVERY 5 MINUTES AS NEEDED FOR CHEST PAIN   pantoprazole (PROTONIX) 40 MG tablet Take 1 tablet (40 mg total) by mouth daily.   PARoxetine (PAXIL) 20 MG tablet TAKE 1 TABLET(20 MG) BY MOUTH DAILY   potassium chloride (KLOR-CON) 10 MEQ tablet TAKE 2 TABLETS(20 MEQ) BY MOUTH TWICE DAILY   Semaglutide, 1 MG/DOSE, 4 MG/3ML SOPN Inject 1 mg as directed once a week.   spironolactone (ALDACTONE) 25 MG tablet TAKE 1/2 TABLET(12.5 MG) BY MOUTH DAILY   torsemide (DEMADEX) 20 MG tablet TAKE 2 TABLETS BY MOUTH IN THE A.M, TAKE 1 TABLET BY MOUTH IN THE P.M. X'S 3 DAYS THEN REDUCE IT TO TAKING 1 TABLET BY MOUTH TWICE A DAY   traMADol (ULTRAM) 50 MG tablet 1 tab by mouth four times per day as needed   vitamin B-12 (CYANOCOBALAMIN) 1000 MCG tablet Take 1 tablet (1,000 mcg total) by mouth daily. (Patient not taking: Reported on 12/14/2021)   No facility-administered encounter medications on file as of 12/18/2021.   Patient Active Problem List   Diagnosis Date Noted   Hearing loss of left ear due to cerumen impaction 12/15/2021   Asthma 09/17/2021   PVC's (premature ventricular contractions) 09/03/2021   SVT (supraventricular tachycardia) ([place]) 09/03/2021   History of TIA (transient ischemic attack) 02/05/2021   History of stroke 01/2021   Aortic atherosclerosis ([place]) 12/26/2020   Cough 12/26/2020   Vitamin D deficiency 04/03/2020   B12 deficiency 04/03/2020   Itching 11/22/2019   Chronic heart failure with preserved ejection fraction (HFpEF) ([place])  05/13/2019   Elevated troponin 05/13/2019   Morbid obesity with BMI of 40.0-44.9, adult ([place]) 05/13/2019   Pre-ulcerative calluses 02/03/2019   Foot injury, left, initial encounter 11/26/2018   Chest pain 05/30/2017   Hypokalemia 05/30/2017   Allergic rhinitis 12/18/2016   Lumbar radiculitis 09/05/2016   Lumbar spinal stenosis 09/05/2016   CKD (chronic kidney disease), stage III ([place]) 12/29/2015   Chest pain syndrome 12/15/2015   Acute kidney injury ([place]) 12/15/2015   Atypical chest pain 12/15/2015   Encounter for well adult exam with abnormal findings 06/27/2015   Chronic low back pain 06/27/2015   Left lumbar radiculopathy 04/13/2015   Bilateral plantar fasciitis 04/13/2015   Essential hypertension 03/09/2015   OSA (obstructive sleep apnea) 03/27/2014   Peripheral neuropathy ([place]) 12/29/2013   External hemorrhoid, thrombosed 12/03/2013   Sinus bradycardia 11/15/2013   Coronary artery disease involving native coronary artery of native heart without angina pectoris    Hyperlipidemia    DM2 (diabetes  mellitus, type 2) ([place]) 06/26/2013   Gait disorder 06/26/2013   Obesity, Class III, BMI 40-49.9 (morbid obesity) ([place]) 09/10/2012   Former smoker 09/02/2012   Ischemic cardiomyopathy- EF 50-55% June 2015    Old MI (myocardial infarction) 09/01/2012   HEMORRHOIDS 12/26/2009   Acute prostatitis 12/26/2009   GANGLION CYST, WRIST, RIGHT 11/28/2009   COLONIC POLYPS 10/07/2009   Anxiety with depression 07/21/2009   DYSPNEA 03/24/2009   GASTROESOPHAGEAL REFLUX DISEASE 01/27/2007   PERCUTANEOUS TRANSLUMINAL CORONARY ANGIOPLASTY, HX OF 02/09/2006   Conditions to be addressed/monitored:  CHF and DMII  Care Plan : RN Care Manager Plan of Care  Updates made by Knox Royalty, RN since 12/18/2021 12:00 AM     Problem: Chronic Disease Management Needs   Priority: High     Long-Range Goal: Ongoing adherence to established plan of care for long term chronic disease management   Start  Date: 01/25/2021  Expected End Date: 01/25/2022  Priority: High  Note:   Current Barriers:  Chronic Disease Management support and education needs related to CHF and DMII Literacy barriers- patient reports needs assistance understanding health care advice 08/31/21: confirms he has a friend who will assist with reading post-office visit instructions 05/29/21-- Has still not obtained glucometer/ scales from insurance provider: confirmed patient is working with [place] team for prescription needs: he reports he is "waiting" to receive glucometer after recent CCM pharmacy visit 05/08/21 at which time, CCM pharmacist requested new order/ prescription for glucometer 08/31/21: reports he has obtained and is using his glucometer at home for blood sugar monitoring 09/19/21: I have reached out multiple times to patient's cardiology provider to inquire about possibility of patient obtaining a scale from their office for use at home for daily weight monitoring (patient is away form his home throughout each day and does not want scales delivered due to possibility of porch pirates): provider has kindly agreed to provide/ instruct patient in use of scale, however, patient cancels and re-schedules his appointments often, making it difficult for cardiology provider to recall our prior conversations about this: patient still does not have scales, and today, I reminded patient of his upcoming (re-scheduled many times) cardiology provider appointment on Oct 27, 2021-- I encouraged patient to attend this appoint as scheduled and to [facility] the provider that he needs a scale to be provided to him for daily weight monitoring at home: patient verbalizes understanding and agreement 11/15/21: received confirmation from cardiology provider on 10/27/21 that scales were provided to patient at time of 10/27/21 cardiology provider appointment; confirmed patient has and is using scales 11/15/21: patient reports these barriers are now  resolved Recent hospitalization (observation) for TIA August 28-29, 2022  RNCM Clinical Goal(s):  Patient will demonstrate improved adherence to prescribed treatment plan for CHF and DMII as evidenced by daily monitoring and recording of CBG  adherence to ADA/ carb modified diet adherence to prescribed medication regimen contacting provider for new or worsened symptoms or questions monitoring and recording daily weights at home    through collaboration with RN Care manager, provider, and care team.   Interventions: 1:1 collaboration with primary care provider regarding development and update of comprehensive plan of care as evidenced by provider attestation and co-signature Inter-disciplinary care team collaboration (see longitudinal plan of care) Evaluation of current treatment plan related to  self management and patient's adherence to plan as established by provider Review of patient status, including review of consultants reports, relevant laboratory and other test results, and medications completed Reviewed  recent PCP office visit 12/14/21- patient verbalizes fair understanding of post-office visit instructions and denies questions Medications discussed:  reports continues to independently self-manage and denies current concerns/ issues/ questions around medications; endorses adherence to taking all medications as prescribed; confirms he has not yet increased Ozempic to 1 mg q week- he is waiting for pharmacy to fill and verbalizes plans to check with pharmacy today re: status Discussed plans with patient for ongoing care management follow up- patient denies current care coordination/ care management needs and is agreeable to CCM RN CM case closure today; verbalizes understanding to contact PCP or other care providers for any needs that arise in the future, and confirms he has contact information for all care providers     Heart Failure Interventions:  (Status: 12/18/21:  Goal Met.) Long Term  Goal Basic overview and discussion of pathophysiology of Heart Failure reviewed Discussed importance of daily weight and advised patient to weigh and record daily Discussed the importance of keeping all appointments with provider Provided patient with education about the role of exercise in the management of heart failure Confirmed patient obtained scales as per our last outreach 11/15/21; he is using scales for weight monitoring at home intermittently; reports weight ranges between 333-337 lbs with most recent weight of "335 lbs;" reinforced rationale for daily weight monitoring at home along with weight gain guidelines and corresponding action plan Reinforced previously provided education around signs/ symptoms CHF yellow zone and corresponding action plan: he denies all today; states breathing "fine," and denies swelling in lower extremities/ abdomen Denies recent issues around breathing status: continues walking regularly, reports tolerating well  Diabetes:  (Status: 12/18/21: Goal Met.) Long Term Goal Lab Results  Component Value Date   HGBA1C 7.6 (H) 12/14/2021  Provided education to patient about basic DM disease process; Confirmed patient typically monitoring/ recording blood sugars at home twice a day-- he reports he has not been monitoring consistently at home due to caring for a sick friend; he was again encouraged to make daily blood sugar monitoring a part of his regular routine; he verbalizes plans to resume regular home monitoring "soon;" no recent blood sugars available for our review today Continues to deny recent episodes/ signs/ symptoms hypoglycemia Confirmed "trying" to follow prescribed diet for HTN/ HLD/ DMII: reinforced dietary strategies for improved adherence     Plan: No further follow up required: patient denies current care coordination/ care management needs and is agreeable to CCM RN CM case closure today; CCM RN CM case closure accordingly     [name],  RN, BSN, [place] [phone]: direct office

## 2021-12-28 DIAGNOSIS — H401113 Primary open-angle glaucoma, right eye, severe stage: Secondary | ICD-10-CM | POA: Diagnosis not present

## 2022-01-01 ENCOUNTER — Telehealth: Payer: Medicare Other

## 2022-01-08 DIAGNOSIS — I5042 Chronic combined systolic (congestive) and diastolic (congestive) heart failure: Secondary | ICD-10-CM

## 2022-01-08 DIAGNOSIS — E1159 Type 2 diabetes mellitus with other circulatory complications: Secondary | ICD-10-CM

## 2022-01-16 ENCOUNTER — Other Ambulatory Visit: Payer: Self-pay

## 2022-01-16 ENCOUNTER — Telehealth: Payer: Medicare Other

## 2022-01-16 DIAGNOSIS — I5032 Chronic diastolic (congestive) heart failure: Secondary | ICD-10-CM

## 2022-01-16 DIAGNOSIS — M5416 Radiculopathy, lumbar region: Secondary | ICD-10-CM

## 2022-01-16 MED ORDER — GABAPENTIN 300 MG PO CAPS: 300.0000 mg | ORAL_CAPSULE | Freq: Three times a day (TID) | ORAL | 1 refills | Status: DC | PRN

## 2022-01-16 MED ORDER — TORSEMIDE 20 MG PO TABS: ORAL_TABLET | ORAL | 3 refills | Status: DC

## 2022-01-18 ENCOUNTER — Telehealth: Payer: Self-pay | Admitting: Internal Medicine

## 2022-01-18 DIAGNOSIS — I5032 Chronic diastolic (congestive) heart failure: Secondary | ICD-10-CM

## 2022-01-18 MED ORDER — TORSEMIDE 20 MG PO TABS: ORAL_TABLET | ORAL | 3 refills | Status: DC

## 2022-01-18 NOTE — Telephone Encounter (Signed)
Patient needs a refill on torsemide 20 mg. - Please send to [facility] [address] on [address]

## 2022-01-18 NOTE — Telephone Encounter (Signed)
Sent refill to [facility].../[facility] 

## 2022-01-22 ENCOUNTER — Other Ambulatory Visit: Payer: Self-pay | Admitting: Physician Assistant

## 2022-01-30 ENCOUNTER — Other Ambulatory Visit: Payer: Self-pay | Admitting: Physician Assistant

## 2022-02-14 ENCOUNTER — Other Ambulatory Visit: Payer: Self-pay

## 2022-02-14 MED ORDER — PAROXETINE HCL 20 MG PO TABS
ORAL_TABLET | ORAL | 3 refills | Status: DC
Start: 2022-02-14 — End: 2023-08-06

## 2022-02-15 ENCOUNTER — Emergency Department (HOSPITAL_COMMUNITY): Payer: Medicare Other

## 2022-02-15 ENCOUNTER — Other Ambulatory Visit: Payer: Self-pay

## 2022-02-15 ENCOUNTER — Observation Stay (HOSPITAL_COMMUNITY): Payer: Medicare Other

## 2022-02-15 ENCOUNTER — Observation Stay (HOSPITAL_COMMUNITY)
Admission: EM | Admit: 2022-02-15 | Discharge: 2022-02-16 | Disposition: A | Discharge status: Home / Self Care | Payer: Medicare Other | Attending: Family Medicine | Admitting: Family Medicine

## 2022-02-15 ENCOUNTER — Encounter (HOSPITAL_COMMUNITY): Payer: Self-pay

## 2022-02-15 DIAGNOSIS — Z7982 Long term (current) use of aspirin: Secondary | ICD-10-CM | POA: Insufficient documentation

## 2022-02-15 DIAGNOSIS — Z7984 Long term (current) use of oral hypoglycemic drugs: Secondary | ICD-10-CM | POA: Insufficient documentation

## 2022-02-15 DIAGNOSIS — R55 Syncope and collapse: Principal | ICD-10-CM

## 2022-02-15 DIAGNOSIS — E119 Type 2 diabetes mellitus without complications: Secondary | ICD-10-CM

## 2022-02-15 DIAGNOSIS — I130 Hypertensive heart and chronic kidney disease with heart failure and stage 1 through stage 4 chronic kidney disease, or unspecified chronic kidney disease: Secondary | ICD-10-CM | POA: Diagnosis not present

## 2022-02-15 DIAGNOSIS — Z79899 Other long term (current) drug therapy: Secondary | ICD-10-CM | POA: Diagnosis not present

## 2022-02-15 DIAGNOSIS — I5042 Chronic combined systolic (congestive) and diastolic (congestive) heart failure: Secondary | ICD-10-CM | POA: Diagnosis not present

## 2022-02-15 DIAGNOSIS — R0602 Shortness of breath: Secondary | ICD-10-CM | POA: Insufficient documentation

## 2022-02-15 DIAGNOSIS — E785 Hyperlipidemia, unspecified: Secondary | ICD-10-CM | POA: Diagnosis present

## 2022-02-15 DIAGNOSIS — E1122 Type 2 diabetes mellitus with diabetic chronic kidney disease: Secondary | ICD-10-CM | POA: Diagnosis not present

## 2022-02-15 DIAGNOSIS — R42 Dizziness and giddiness: Secondary | ICD-10-CM | POA: Diagnosis not present

## 2022-02-15 DIAGNOSIS — I959 Hypotension, unspecified: Secondary | ICD-10-CM

## 2022-02-15 DIAGNOSIS — N1831 Chronic kidney disease, stage 3a: Secondary | ICD-10-CM | POA: Diagnosis not present

## 2022-02-15 DIAGNOSIS — I2510 Atherosclerotic heart disease of native coronary artery without angina pectoris: Secondary | ICD-10-CM | POA: Diagnosis not present

## 2022-02-15 DIAGNOSIS — N179 Acute kidney failure, unspecified: Secondary | ICD-10-CM

## 2022-02-15 DIAGNOSIS — R0609 Other forms of dyspnea: Secondary | ICD-10-CM

## 2022-02-15 DIAGNOSIS — Z87891 Personal history of nicotine dependence: Secondary | ICD-10-CM | POA: Insufficient documentation

## 2022-02-15 DIAGNOSIS — Z952 Presence of prosthetic heart valve: Secondary | ICD-10-CM | POA: Insufficient documentation

## 2022-02-15 DIAGNOSIS — I6381 Other cerebral infarction due to occlusion or stenosis of small artery: Secondary | ICD-10-CM | POA: Diagnosis not present

## 2022-02-15 DIAGNOSIS — E1140 Type 2 diabetes mellitus with diabetic neuropathy, unspecified: Secondary | ICD-10-CM | POA: Insufficient documentation

## 2022-02-15 LAB — URINALYSIS, ROUTINE W REFLEX MICROSCOPIC
Bacteria, UA: NONE SEEN
Bilirubin Urine: NEGATIVE
Glucose, UA: >=500 mg/dL — AB
Hgb urine dipstick: NEGATIVE
Ketones, ur: NEGATIVE mg/dL
Leukocytes,Ua: NEGATIVE
Nitrite: NEGATIVE
Protein, ur: NEGATIVE mg/dL
Specific Gravity, Urine: 1.012 (ref 1.005–1.030)
pH: 5 (ref 5.0–8.0)

## 2022-02-15 LAB — CBC WITH DIFFERENTIAL/PLATELET
Abs Immature Granulocytes: 0.01 10*3/uL (ref 0.00–0.07)
Basophils Absolute: 0 10*3/uL (ref 0.0–0.1)
Basophils Relative: 1 %
Eosinophils Absolute: 0.2 10*3/uL (ref 0.0–0.5)
Eosinophils Relative: 5 %
HCT: 46.5 % (ref 39.0–52.0)
Hemoglobin: 14.9 g/dL (ref 13.0–17.0)
Immature Granulocytes: 0 %
Lymphocytes Relative: 55 %
Lymphs Abs: 3 10*3/uL (ref 0.7–4.0)
MCH: 28.9 pg (ref 26.0–34.0)
MCHC: 32 g/dL (ref 30.0–36.0)
MCV: 90.1 fL (ref 80.0–100.0)
Monocytes Absolute: 0.5 10*3/uL (ref 0.1–1.0)
Monocytes Relative: 9 %
Neutro Abs: 1.6 10*3/uL — ABNORMAL LOW (ref 1.7–7.7)
Neutrophils Relative %: 30 %
Platelets: 193 10*3/uL (ref 150–400)
RBC: 5.16 MIL/uL (ref 4.22–5.81)
RDW: 15.8 % — ABNORMAL HIGH (ref 11.5–15.5)
WBC: 5.3 10*3/uL (ref 4.0–10.5)
nRBC: 0 % (ref 0.0–0.2)

## 2022-02-15 LAB — BASIC METABOLIC PANEL
Anion gap: 7 (ref 5–15)
BUN: 34 mg/dL — ABNORMAL HIGH (ref 8–23)
CO2: 22 mmol/L (ref 22–32)
Calcium: 9.4 mg/dL (ref 8.9–10.3)
Chloride: 112 mmol/L — ABNORMAL HIGH (ref 98–111)
Creatinine, Ser: 2.02 mg/dL — ABNORMAL HIGH (ref 0.61–1.24)
GFR, Estimated: 36 mL/min — ABNORMAL LOW (ref >60)
Glucose, Bld: 172 mg/dL — ABNORMAL HIGH (ref 70–99)
Potassium: 3.4 mmol/L — ABNORMAL LOW (ref 3.5–5.1)
Sodium: 141 mmol/L (ref 135–145)

## 2022-02-15 LAB — CBG MONITORING, ED
Glucose-Capillary: 80 mg/dL (ref 70–99)
Glucose-Capillary: 119 mg/dL — ABNORMAL HIGH (ref 70–99)

## 2022-02-15 LAB — TROPONIN I (HIGH SENSITIVITY)
Troponin I (High Sensitivity): 44 ng/L — ABNORMAL HIGH (ref <18)
Troponin I (High Sensitivity): 37 ng/L — ABNORMAL HIGH (ref <18)

## 2022-02-15 LAB — MAGNESIUM: Magnesium: 2.1 mg/dL (ref 1.7–2.4)

## 2022-02-15 LAB — BRAIN NATRIURETIC PEPTIDE: B Natriuretic Peptide: 49.5 pg/mL (ref 0.0–100.0)

## 2022-02-15 MED ORDER — CLOPIDOGREL BISULFATE 75 MG PO TABS
75.0000 mg | ORAL_TABLET | Freq: Every day | ORAL | Status: DC
  Administered 2022-02-16: 75 mg via ORAL
  Filled 2022-02-15: qty 1

## 2022-02-15 MED ORDER — CARVEDILOL 25 MG PO TABS
25.0000 mg | ORAL_TABLET | Freq: Two times a day (BID) | ORAL | Status: DC
  Administered 2022-02-15 – 2022-02-16 (×2): 25 mg via ORAL
  Filled 2022-02-15: qty 2
  Filled 2022-02-15: qty 1

## 2022-02-15 MED ORDER — ATORVASTATIN CALCIUM 80 MG PO TABS
80.0000 mg | ORAL_TABLET | Freq: Every day | ORAL | Status: DC
  Administered 2022-02-15: 80 mg via ORAL
  Filled 2022-02-15: qty 1

## 2022-02-15 MED ORDER — INSULIN ASPART 100 UNIT/ML IJ SOLN
0.0000 [IU] | Freq: Three times a day (TID) | INTRAMUSCULAR | Status: DC
  Administered 2022-02-16 (×2): 1 [IU] via SUBCUTANEOUS

## 2022-02-15 MED ORDER — ASPIRIN 81 MG PO TBEC
81.0000 mg | DELAYED_RELEASE_TABLET | Freq: Every day | ORAL | Status: DC
  Administered 2022-02-16: 81 mg via ORAL
  Filled 2022-02-15: qty 1

## 2022-02-15 MED ORDER — GABAPENTIN 300 MG PO CAPS
300.0000 mg | ORAL_CAPSULE | Freq: Three times a day (TID) | ORAL | Status: DC | PRN
  Administered 2022-02-15: 300 mg via ORAL
  Filled 2022-02-15: qty 1

## 2022-02-15 MED ORDER — PANTOPRAZOLE SODIUM 40 MG PO TBEC
40.0000 mg | DELAYED_RELEASE_TABLET | Freq: Every day | ORAL | Status: DC
  Administered 2022-02-16: 40 mg via ORAL
  Filled 2022-02-15: qty 1

## 2022-02-15 MED ORDER — HYDRALAZINE HCL 25 MG PO TABS: 25.0000 mg | ORAL_TABLET | Freq: Four times a day (QID) | ORAL | Status: DC | PRN

## 2022-02-15 MED ORDER — ALBUTEROL SULFATE HFA 108 (90 BASE) MCG/ACT IN AERS: 2.0000 | INHALATION_SPRAY | RESPIRATORY_TRACT | Status: DC | PRN

## 2022-02-15 MED ORDER — HEPARIN SODIUM (PORCINE) 5000 UNIT/ML IJ SOLN
5000.0000 [IU] | Freq: Three times a day (TID) | INTRAMUSCULAR | Status: DC
  Administered 2022-02-15 – 2022-02-16 (×2): 5000 [IU] via SUBCUTANEOUS
  Filled 2022-02-15 (×2): qty 1

## 2022-02-15 MED ORDER — PAROXETINE HCL 20 MG PO TABS
20.0000 mg | ORAL_TABLET | Freq: Every day | ORAL | Status: DC
  Administered 2022-02-15 – 2022-02-16 (×2): 20 mg via ORAL
  Filled 2022-02-15 (×4): qty 1

## 2022-02-15 MED ORDER — TRAMADOL HCL 50 MG PO TABS: 50.0000 mg | ORAL_TABLET | Freq: Two times a day (BID) | ORAL | Status: DC | PRN

## 2022-02-15 MED ORDER — SODIUM CHLORIDE 0.9% FLUSH
3.0000 mL | Freq: Two times a day (BID) | INTRAVENOUS | Status: DC
  Administered 2022-02-15 – 2022-02-16 (×2): 3 mL via INTRAVENOUS

## 2022-02-15 MED ORDER — POTASSIUM CHLORIDE CRYS ER 20 MEQ PO TBCR
40.0000 meq | EXTENDED_RELEASE_TABLET | Freq: Once | ORAL | Status: AC
Start: 2022-02-15 — End: 2022-02-15
  Administered 2022-02-15: 40 meq via ORAL
  Filled 2022-02-15: qty 2

## 2022-02-15 MED ORDER — ALBUTEROL SULFATE (2.5 MG/3ML) 0.083% IN NEBU: 3.0000 mL | INHALATION_SOLUTION | Freq: Four times a day (QID) | RESPIRATORY_TRACT | Status: DC | PRN

## 2022-02-15 NOTE — ED Provider Notes (Signed)
[facility] EMERGENCY DEPARTMENT Provider Note   CSN: [id number] Arrival date & time: 02/15/22  1051     History  Chief Complaint  Patient presents with   Dizziness   Shortness of Breath    [name] [name] is a 66 y.o. male.  Patient is a 67 year old male with a past medical history of CAD, hypertension, CKD, diabetes presenting to the emergency department with dizziness, dyspnea on exertion over the last few days.  The patient states that he has been feeling lightheaded and dizzy on and off for the last few days and has had episodes of associated right arm tingling with his symptoms.  He states that last night he had an episode of chest tightness while at rest that resolved with nitro.  He states he has been chest pain-free since then.  He states over the last few days he has had worsening dyspnea on exertion and can hardly walk across the room without getting short of breath.  He denies any fever or cough.  He denies any vision changes.  He denies any increased lower extremity swelling.  The history is provided by the patient.  Dizziness Associated symptoms: shortness of breath   Shortness of Breath      Home Medications Prior to Admission medications   Medication Sig Start Date End Date Taking? Authorizing Provider  Accu-Chek Softclix Lancets lancets Use as instructed E11.9 10/13/21   Biagio Borg, MD  albuterol (VENTOLIN HFA) 108 (90 Base) MCG/ACT inhaler Inhale 2 puffs into the lungs every 6 (six) hours as needed for wheezing or shortness of breath. 09/13/21   Biagio Borg, MD  Alcohol Swabs (B-D SINGLE USE SWABS BUTTERFLY) PADS Use up to 4 times a day to test blood sugars. Dx: E10.9, E11.9 09/30/20   Biagio Borg, MD  aspirin 81 MG EC tablet Take 81 mg by mouth daily. 02/06/21   [provider]  atorvastatin (LIPITOR) 80 MG tablet Take 1 tablet (80 mg total) by mouth at bedtime. 07/24/21   Sherren Mocha, MD  Blood Glucose Calibration (TRUE METRIX LEVEL  1) Low SOLN Use as needed. Dx: E10.9, E11.9 01/12/21   Biagio Borg, MD  blood glucose meter kit and supplies Dispense per insurance preference. Use as directed once daily. (FOR ICD-10  E11.9). 07/04/21   Biagio Borg, MD  carvedilol (COREG) 25 MG tablet TAKE 1 TABLET(25 MG) BY MOUTH TWICE DAILY WITH A MEAL 11/09/21   Sherren Mocha, MD  cetirizine (ZYRTEC) 10 MG tablet TAKE 1 TABLET BY MOUTH DAILY Patient not taking: Reported on 12/14/2021 02/14/21   Biagio Borg, MD  cholecalciferol (VITAMIN D3) 25 MCG (1000 UNIT) tablet Take 1,000 Units by mouth daily.    [provider]  clopidogrel (PLAVIX) 75 MG tablet Take 1 tablet (75 mg total) by mouth daily. 02/06/21   Regalado, Jerald Kief A, MD  empagliflozin (JARDIANCE) 25 MG TABS tablet Take 1 tablet (25 mg total) by mouth daily before breakfast. 07/24/21   Biagio Borg, MD  gabapentin (NEURONTIN) 300 MG capsule Take 1 capsule (300 mg total) by mouth 3 (three) times daily as needed. 01/16/22   Biagio Borg, MD  glucose blood (ACCU-CHEK GUIDE) test strip Use as instructed E11.9 10/13/21   Biagio Borg, MD  glucose blood (TRUE METRIX BLOOD GLUCOSE TEST) test strip Use to test blood sugars up to 4 times a day. DX: E10.9, E.11.9 02/17/21   Biagio Borg, MD  hydrocortisone cream 1 %  Apply to affected area 2 times daily Patient not taking: Reported on 12/14/2021 02/02/21   Sherwood Gambler, MD  isosorbide mononitrate (IMDUR) 30 MG 24 hr tablet TAKE 3 TABLETS(90 MG) BY MOUTH DAILY 08/26/21   Biagio Borg, MD  Lancets 33G MISC Use to test blood sugars up to 4 times a day. DX: E10.9, E11.9 02/17/21   Biagio Borg, MD  losartan (COZAAR) 100 MG tablet Take 1 tablet (100 mg total) by mouth daily. 07/24/21   Biagio Borg, MD  nitroGLYCERIN (NITROSTAT) 0.4 MG SL tablet PLACE 1 TABLET UNDER THE TONGUE EVERY 5 MINUTES AS NEEDED FOR CHEST PAIN 02/14/21   Biagio Borg, MD  pantoprazole (PROTONIX) 40 MG tablet Take 1 tablet (40 mg total) by mouth daily. 02/02/21   Biagio Borg, MD   PARoxetine (PAXIL) 20 MG tablet TAKE 1 TABLET(20 MG) BY MOUTH DAILY 02/14/22   Biagio Borg, MD  potassium chloride (KLOR-CON) 10 MEQ tablet TAKE 2 TABLETS(20 MEQ) BY MOUTH TWICE DAILY 06/30/21   [name] T, PA-C  Semaglutide, 1 MG/DOSE, 4 MG/3ML SOPN Inject 1 mg as directed once a week. 12/14/21   Biagio Borg, MD  spironolactone (ALDACTONE) 25 MG tablet TAKE 1/2 TABLET(12.5 MG) BY MOUTH DAILY 05/02/21   Sherren Mocha, MD  torsemide (DEMADEX) 20 MG tablet TAKE 2 TABLETS BY MOUTH IN THE A.M, TAKE 1 TABLET BY MOUTH IN THE P.M. X'S 3 DAYS THEN REDUCE IT TO TAKING 1 TABLET BY MOUTH TWICE A DAY 01/18/22   Biagio Borg, MD  traMADol Veatrice Bourbon) 50 MG tablet 1 tab by mouth four times per day as needed 09/13/21   Biagio Borg, MD  vitamin B-12 (CYANOCOBALAMIN) 1000 MCG tablet Take 1 tablet (1,000 mcg total) by mouth daily. Patient not taking: Reported on 12/14/2021 11/25/19   Biagio Borg, MD      Allergies    Penicillin g and Penicillins    Review of Systems   Review of Systems  Respiratory:  Positive for shortness of breath.   Neurological:  Positive for dizziness.    Physical Exam Updated Vital Signs BP 96/62   Pulse 63   Temp 98.1 F (36.7 C) (Oral)   Resp 14   Ht $R'6\' 1"'cy$  (1.854 m)   Wt (!) 152 kg   SpO2 95%   BMI 44.20 kg/m  Physical Exam Vitals and nursing note reviewed.  Constitutional:      General: He is not in acute distress.    Appearance: He is well-developed.  HENT:     Head: Normocephalic and atraumatic.     Mouth/Throat:     Mouth: Mucous membranes are moist.     Pharynx: Oropharynx is clear.  Eyes:     Extraocular Movements: Extraocular movements intact.     Pupils: Pupils are equal, round, and reactive to light.     Comments: No nystagmus  Cardiovascular:     Rate and Rhythm: Normal rate and regular rhythm.     Pulses: Normal pulses.     Heart sounds: Normal heart sounds.  Pulmonary:     Effort: Pulmonary effort is normal.     Breath sounds: Normal breath  sounds.  Abdominal:     Palpations: Abdomen is soft.     Tenderness: There is no abdominal tenderness.  Musculoskeletal:        General: Normal range of motion.     Cervical back: Normal range of motion and neck supple.  Right lower leg: No edema.     Left lower leg: No edema.  Skin:    General: Skin is warm and dry.  Neurological:     General: No focal deficit present.     Mental Status: He is alert and oriented to person, place, and time.     Cranial Nerves: No cranial nerve deficit.     Motor: No weakness.     Comments: Normal finger to nose bilaterally  Psychiatric:        Mood and Affect: Mood normal.        Behavior: Behavior normal.     ED Results / Procedures / Treatments   Labs (all labs ordered are listed, but only abnormal results are displayed) Labs Reviewed  CBC WITH DIFFERENTIAL/PLATELET - Abnormal; Notable for the following components:      Result Value   RDW 15.8 (*)    Neutro Abs 1.6 (*)    All other components within normal limits  BASIC METABOLIC PANEL - Abnormal; Notable for the following components:   Potassium 3.4 (*)    Chloride 112 (*)    Glucose, Bld 172 (*)    BUN 34 (*)    Creatinine, Ser 2.02 (*)    GFR, Estimated 36 (*)    All other components within normal limits  URINALYSIS, ROUTINE W REFLEX MICROSCOPIC - Abnormal; Notable for the following components:   Glucose, UA >=500 (*)    All other components within normal limits  TROPONIN I (HIGH SENSITIVITY) - Abnormal; Notable for the following components:   Troponin I (High Sensitivity) 44 (*)    All other components within normal limits  TROPONIN I (HIGH SENSITIVITY) - Abnormal; Notable for the following components:   Troponin I (High Sensitivity) 37 (*)    All other components within normal limits  MAGNESIUM  BRAIN NATRIURETIC PEPTIDE    EKG EKG Interpretation  Date/Time:  Thursday February 15 2022 11:14:39 EDT Ventricular Rate:  70 PR Interval:  158 QRS Duration: 180 QT  Interval:  468 QTC Calculation: 505 R Axis:   -63 Text Interpretation: Sinus rhythm with Premature atrial complexes Right bundle branch block Left anterior fascicular block Bifascicular block Minimal voltage criteria for LVH, may be normal variant ( R in aVL ) Possible Lateral infarct , age undetermined Inferior infarct , age undetermined Abnormal ECG No significant change since last tracing Confirmed by Oneal Deputy [phone]) on 02/15/2022 2:49:25 PM  Radiology CT Head Wo Contrast  Result Date: 02/15/2022 CLINICAL DATA:  TIA dizziness. EXAM: CT HEAD WITHOUT CONTRAST TECHNIQUE: Contiguous axial images were obtained from the base of the skull through the vertex without intravenous contrast. RADIATION DOSE REDUCTION: This exam was performed according to the departmental dose-optimization program which includes automated exposure control, adjustment of the mA and/or kV according to patient size and/or use of iterative reconstruction technique. COMPARISON:  Head CT 02/05/2021 FINDINGS: Brain: There is no acute intracranial hemorrhage, extra-axial fluid collection, or acute infarct. Parenchymal volume is normal. The ventricles are normal in size. Gray-white differentiation is preserved. Patchy hypodensity in the supratentorial white matter likely reflects underlying chronic small vessel ischemic change, similar to the prior study. There is a small remote lacunar infarct in the left cerebellar hemisphere, unchanged. There is no mass lesion.  There is no mass effect or midline shift. Vascular: No hyperdense vessel or unexpected calcification. Skull: Normal. Negative for fracture or focal lesion. Sinuses/Orbits: There is mild mucosal thickening in the paranasal sinuses. The globes and orbits are unremarkable.  Other: None. IMPRESSION: No acute intracranial pathology. Electronically Signed   By: Valetta Mole M.D.   On: 02/15/2022 14:10   DG Chest 2 View  Result Date: 02/15/2022 CLINICAL DATA:  66 year old male  presents with shortness of breath and dizziness. EXAM: CHEST - 2 VIEW COMPARISON:  February 05, 2021 FINDINGS: Trachea midline. Cardiomediastinal contours and hilar structures with increased prominence since prior imaging but in the setting of low lung volumes, likely within the range of normal. Low lung volumes accentuate mediastinal contours. Basilar airspace disease in the mid and lower chest bilaterally. No pneumothorax. No sign of pleural effusion. On limited assessment no acute skeletal process. IMPRESSION: Low lung volumes with bibasilar airspace disease in the mid and lower chest bilaterally. Findings likely reflect atelectatic changes though difficult to exclude developing infection including viral process. Electronically Signed   By: Zetta Bills M.D.   On: 02/15/2022 11:48    Procedures Procedures    Medications Ordered in ED Medications  albuterol (VENTOLIN HFA) 108 (90 Base) MCG/ACT inhaler 2 puff (has no administration in time range)    ED Course/ Medical Decision Making/ A&P Clinical Course as of 02/15/22 1517  Thu Feb 15, 2022  1446 Review of patient's troponin that he usually has mild elevation in the 40s to 50s and repeat troponin here is downtrending.  He is without any chest pain.  His labs show mild increase of his creatinine and mild hypokalemia.  Potassium was repleted.  CT shows no evidence of acute CVA and he has no focal neurologic deficits making stroke less likely cause of his dizziness. [VK]  [id number] Cardiology recommended admission to hospitalist for chest pain rule out and monitoring of his presyncopal symptoms.  The patient is agreeable with the plan. [VK]    Clinical Course User Index [VK] Ottie Glazier, DO                           Medical Decision Making This patient presents to the ED with chief complaint(s) of dizziness and shortness of breath with pertinent past medical history of CAD, CKD, DM, HTN which further complicates the presenting complaint. The  complaint involves an extensive differential diagnosis and also carries with it a high risk of complications and morbidity.    The differential diagnosis includes TIA, ACS, arrhythmia, electrolyte abnormality, no current signs of stroke on exam  Additional history obtained: Additional history obtained from N/A Records reviewed Primary Care Documents  ED Course and Reassessment: Patient will have labs, EKG and CT head performed to evaluate for the cause of his symptoms.  He does not appear significantly volume overloaded making CHF less likely.  EKG will be performed to evaluate for ACS or arrhythmia.  He has no current focal neurologic deficits making stroke less likely.  Independent labs interpretation:  The following labs were independently interpreted: Mild hypokalemia with a potassium of 3.4, mild AKI with creatinine of 2 from baseline of 1.7, troponin downtrending  Independent visualization of imaging: - I independently visualized the following imaging with scope of interpretation limited to determining acute life threatening conditions related to emergency care: CT head, chest x-ray, which revealed no acute disease  Consultation: - Consulted or discussed management/test interpretation w/ external professional: Cardiology, hospitalist  Consideration for admission or further workup: Using shared decision-making with the patient, he would prefer admission for further work-up as a cause of his dyspnea and lightheadedness.  Patient is also high risk with a heart  score of 5 in the setting of his chest pain and dyspnea.  I spoke with cardiology who recommended admission to hospitalist and they will evaluate the patient in the morning.  Hospitalist accepted the patient for admission. Social Determinants of health: N/A    Amount and/or Complexity of Data Reviewed Labs: ordered. Radiology: ordered.  Risk Prescription drug management. Decision regarding  hospitalization.          Final Clinical Impression(s) / ED Diagnoses Final diagnoses:  Dizziness  AKI (acute kidney injury) ([place])  Dyspnea on exertion    Rx / DC Orders ED Discharge Orders     None         Ottie Glazier, DO 02/15/22 1526

## 2022-02-15 NOTE — ED Triage Notes (Signed)
Reports dizziness, vertigo and sob that started 3 days ago.  Reports chest pain last night described as tightness.  Also reports palpiations.

## 2022-02-15 NOTE — ED Provider Triage Note (Signed)
Emergency Medicine Provider Triage Evaluation Note  Shady Bradish , a 66 y.o. male  was evaluated in triage.  Pt complains of shortness of breath x3 days.  Chest tightness last night which resolved this morning..  Review of Systems  Per HPI Physical Exam  BP (!) 93/55 (BP Location: Right Arm)   Pulse 69   Temp 98.3 F (36.8 C) (Oral)   Resp 20   Ht '6\' 1"'$  (1.854 m)   Wt (!) 152 kg   SpO2 91%   BMI 44.20 kg/m  Gen:   Awake, no distress   Resp:  Normal effort  MSK:   Moves extremities without difficulty  Other:    Medical Decision Making  Medically screening exam initiated at 11:59 AM.  Appropriate orders placed.  Khylan Sawyer was informed that the remainder of the evaluation will be completed by another provider, this initial triage assessment does not replace that evaluation, and the importance of remaining in the ED until their evaluation is complete.     Sherrill Raring, PA-C 02/15/22 1200

## 2022-02-15 NOTE — H&P (Signed)
History and Physical    [name] [name] RJJ:[id number] DOB: Oct 22, 1955 DOA: 02/15/2022  PCP: Biagio Borg, MD (Confirm with patient/family/NH records and if not entered, this has to be entered at [facility] point of entry) Patient coming from: Home  I have personally briefly reviewed patient's old medical records in [place]  Chief Complaint: Feeling dizzy, palpitations  HPI: [name] [name] is a 66 y.o. male with medical history significant of CAD status post stenting with ischemic cardiomyopathy, chronic combined HFrEF and HFpEF, HTN, IIDM, diabetic neuropathy, CKD stage II, frequent PVCs on beta-blocker, presented with frequent episodes of feeling lightheadedness.  Symptoms started about 1 week ago, most symptoms happened in the mornings, when patient getting out of his bed or standing up and started to walk, he likely will experience some lightheadedness and blurry vision, sometimes he had to try to hold something to maintain balance.  He does not measure his blood pressure at home.  And no recent blood pressure med changes.  3 weeks ago, he was started on Ozempic to treat uncontrolled diabetes and to lose weight.  He also reported vomiting episodes of palpitation/chest tightness 3 nights ago, lasted about few minutes and subside by itself.  He does noticed any increasing leg swelling or significant weight changes.  ED Course: Blood pressure borderline low, CT head negative for acute findings, chest x-ray bilateral lower field atelectasis.  Blood work creatinine 2.0 compared to baseline 1.7.  EKG and telemetry monitor showed frequent PVCs.  Review of Systems: As per HPI otherwise 14 point review of systems negative.    Past Medical History:  Diagnosis Date   CAD (coronary artery disease)    a. s/p BMS pRCA 2002; b. DES to pRCA & DES p/m RCA 2006; c. PCI/DES OM4 2007; d. PCI/CBA to RCA for ISR 10/2006; e.08/2012 STEMI/Cath/PCI:  LAD 95% >> PCI: Promus Prem DES  //  f. NSTEMI 10/15 >> LHC: mLAD  stent ok, dLAD 70, OM1 CTO, OM2 stent ok, dOM2 90, OM3 30-40, dLCx 90, p-mRCA stent ok, mRCA stent ok w/ 60-70 ISR, EF 50% >> med Rx // MV 1/17: no ischemia // Cath ([facility]) 05/2019: RCA 100 (CTO)    Combined systolic and diastolic CHF    a. Myoview 1/17: EF 35%  //  b. Echo 1/17 EF 45-50% // Echo 05/2019: EF 50-55 // Echocardiogram 8/21: EF 50, inf AK, post and mid inf HK, mod LVH, RVSP 25.3, mild to mod LAE, trivial MR    Depression    Diabetes mellitus ([place])    a. A1c 8.8 08/2012->Metformin initiated. => b. A1c (9/14): 6.6   Gastroesophageal reflux disease    History of nuclear stress test    a. Myoview 1/17: EF 35%, fixed inferior lateral defect consistent with infarct, no ischemia, intermediate risk   History of pneumonia    History of stroke 01/2021   Zio Monitor 11/22: NSR, avg HR 61; PVCs (1.9%); no AFib, no high grade HB, no ventricular arrhythmias   HTN (hypertension)    Hx of NSTEMI    Hyperlipidemia    Ischemic cardiomyopathy    a. EF 40%; improved to normal;  b. 08/2012 Echo: EF 50-55%, mod LVH.//  c. Echo 9/16: Inf HK, mild LVH, EF 55%, mild LAE, normal RVF, mild RAE, PASP 35 mmHg  //  d. Echo 1/17: EF 45-50%, inferior HK, mild BAE, PASP 33 mmHg   Obesity    OSA (obstructive sleep apnea)    Does not use CPAP as of  05/2011   Tobacco abuse     Past Surgical History:  Procedure Laterality Date   CORONARY ANGIOPLASTY WITH STENT PLACEMENT     CORONARY STENT PLACEMENT  2009   LEFT HEART CATHETERIZATION WITH CORONARY ANGIOGRAM N/A 08/31/2012   Procedure: LEFT HEART CATHETERIZATION WITH CORONARY ANGIOGRAM;  Surgeon: Burnell Blanks, MD;  Location: [facility] CATH LAB;  Service: Cardiovascular;  Laterality: N/A;   LEFT HEART CATHETERIZATION WITH CORONARY ANGIOGRAM N/A 11/16/2013   Procedure: LEFT HEART CATHETERIZATION WITH CORONARY ANGIOGRAM;  Surgeon: Blane Ohara, MD;  Location: [facility] CATH LAB;  Service: Cardiovascular;  Laterality: N/A;   LEFT HEART CATHETERIZATION WITH CORONARY  ANGIOGRAM N/A 03/15/2014   Procedure: LEFT HEART CATHETERIZATION WITH CORONARY ANGIOGRAM;  Surgeon: Peter M Martinique, MD;  Location: [facility] CATH LAB;  Service: Cardiovascular;  Laterality: N/A;   PERCUTANEOUS CORONARY STENT INTERVENTION (PCI-S) Right 08/31/2012   Procedure: PERCUTANEOUS CORONARY STENT INTERVENTION (PCI-S);  Surgeon: Burnell Blanks, MD;  Location: [facility] CATH LAB;  Service: Cardiovascular;  Laterality: Right;   PERCUTANEOUS CORONARY STENT INTERVENTION (PCI-S)  11/16/2013   Procedure: PERCUTANEOUS CORONARY STENT INTERVENTION (PCI-S);  Surgeon: Blane Ohara, MD;  Location: [facility] CATH LAB;  Service: Cardiovascular;;     reports that he has quit smoking. His smoking use included cigarettes. He has a 15.00 pack-year smoking history. He has never used smokeless tobacco. He reports that he does not drink alcohol and does not use drugs.  Allergies  Allergen Reactions   Penicillin G Other (See Comments)    Did it involve swelling of the face/tongue/throat, SOB, or low BP?  N/A Did it involve sudden or severe rash/hives, skin peeling, or any reaction on the inside of your mouth or nose? N/A Did you need to seek medical attention at a hospital or doctor's office? N/A When did it last happen? Child If all above answers are "NO", may proceed with cephalosporin use.   Penicillins Other (See Comments)    Did it involve swelling of the face/tongue/throat, SOB, or low BP? N/A Did it involve sudden or severe rash/hives, skin peeling, or any reaction on the inside of your mouth or nose?N/A Did you need to seek medical attention at a hospital or doctor's office? N/A When did it last happen? Child      If all above answers are "NO", may proceed with cephalosporin use.    Family History  Problem Relation Age of Onset   Depression Mother    Cancer Mother        ovarian   Hypertension Mother        Died, 3   Other Father        motor vehicle accident   Coronary artery disease Other    Heart  attack Neg Hx    Stroke Neg Hx      Prior to Admission medications   Medication Sig Start Date End Date Taking? Authorizing Provider  Accu-Chek Softclix Lancets lancets Use as instructed E11.9 10/13/21   Biagio Borg, MD  albuterol (VENTOLIN HFA) 108 (90 Base) MCG/ACT inhaler Inhale 2 puffs into the lungs every 6 (six) hours as needed for wheezing or shortness of breath. 09/13/21   Biagio Borg, MD  Alcohol Swabs (B-D SINGLE USE SWABS BUTTERFLY) PADS Use up to 4 times a day to test blood sugars. Dx: E10.9, E11.9 09/30/20   Biagio Borg, MD  aspirin 81 MG EC tablet Take 81 mg by mouth daily. 02/06/21   [provider]  atorvastatin (LIPITOR)  80 MG tablet Take 1 tablet (80 mg total) by mouth at bedtime. 07/24/21   Sherren Mocha, MD  Blood Glucose Calibration (TRUE METRIX LEVEL 1) Low SOLN Use as needed. Dx: E10.9, E11.9 01/12/21   Biagio Borg, MD  blood glucose meter kit and supplies Dispense per insurance preference. Use as directed once daily. (FOR ICD-10  E11.9). 07/04/21   Biagio Borg, MD  carvedilol (COREG) 25 MG tablet TAKE 1 TABLET(25 MG) BY MOUTH TWICE DAILY WITH A MEAL 11/09/21   Sherren Mocha, MD  cetirizine (ZYRTEC) 10 MG tablet TAKE 1 TABLET BY MOUTH DAILY Patient not taking: Reported on 12/14/2021 02/14/21   Biagio Borg, MD  cholecalciferol (VITAMIN D3) 25 MCG (1000 UNIT) tablet Take 1,000 Units by mouth daily.    [provider]  clopidogrel (PLAVIX) 75 MG tablet Take 1 tablet (75 mg total) by mouth daily. 02/06/21   Regalado, Jerald Kief A, MD  empagliflozin (JARDIANCE) 25 MG TABS tablet Take 1 tablet (25 mg total) by mouth daily before breakfast. 07/24/21   Biagio Borg, MD  gabapentin (NEURONTIN) 300 MG capsule Take 1 capsule (300 mg total) by mouth 3 (three) times daily as needed. 01/16/22   Biagio Borg, MD  glucose blood (ACCU-CHEK GUIDE) test strip Use as instructed E11.9 10/13/21   Biagio Borg, MD  glucose blood (TRUE METRIX BLOOD GLUCOSE TEST) test strip Use to test  blood sugars up to 4 times a day. DX: E10.9, E.11.9 02/17/21   Biagio Borg, MD  hydrocortisone cream 1 % Apply to affected area 2 times daily Patient not taking: Reported on 12/14/2021 02/02/21   Sherwood Gambler, MD  isosorbide mononitrate (IMDUR) 30 MG 24 hr tablet TAKE 3 TABLETS(90 MG) BY MOUTH DAILY 08/26/21   Biagio Borg, MD  Lancets 33G MISC Use to test blood sugars up to 4 times a day. DX: E10.9, E11.9 02/17/21   Biagio Borg, MD  losartan (COZAAR) 100 MG tablet Take 1 tablet (100 mg total) by mouth daily. 07/24/21   Biagio Borg, MD  nitroGLYCERIN (NITROSTAT) 0.4 MG SL tablet PLACE 1 TABLET UNDER THE TONGUE EVERY 5 MINUTES AS NEEDED FOR CHEST PAIN 02/14/21   Biagio Borg, MD  pantoprazole (PROTONIX) 40 MG tablet Take 1 tablet (40 mg total) by mouth daily. 02/02/21   Biagio Borg, MD  PARoxetine (PAXIL) 20 MG tablet TAKE 1 TABLET(20 MG) BY MOUTH DAILY 02/14/22   Biagio Borg, MD  potassium chloride (KLOR-CON) 10 MEQ tablet TAKE 2 TABLETS(20 MEQ) BY MOUTH TWICE DAILY 06/30/21   [name] T, PA-C  Semaglutide, 1 MG/DOSE, 4 MG/3ML SOPN Inject 1 mg as directed once a week. 12/14/21   Biagio Borg, MD  spironolactone (ALDACTONE) 25 MG tablet TAKE 1/2 TABLET(12.5 MG) BY MOUTH DAILY 05/02/21   Sherren Mocha, MD  torsemide (DEMADEX) 20 MG tablet TAKE 2 TABLETS BY MOUTH IN THE A.M, TAKE 1 TABLET BY MOUTH IN THE P.M. X'S 3 DAYS THEN REDUCE IT TO TAKING 1 TABLET BY MOUTH TWICE A DAY 01/18/22   Biagio Borg, MD  traMADol Veatrice Bourbon) 50 MG tablet 1 tab by mouth four times per day as needed 09/13/21   Biagio Borg, MD  vitamin B-12 (CYANOCOBALAMIN) 1000 MCG tablet Take 1 tablet (1,000 mcg total) by mouth daily. Patient not taking: Reported on 12/14/2021 11/25/19   Biagio Borg, MD    Physical Exam: Vitals:   02/15/22 1346 02/15/22 1347 02/15/22 1455 02/15/22  1505  BP:   96/62   Pulse: 63 81 63   Resp: $Remo'18 18 14   'ESqKd$ Temp:    98.1 F (36.7 C)  TempSrc:    Oral  SpO2: 95% 96% 95%   Weight:      Height:         Constitutional: NAD, calm, comfortable Vitals:   02/15/22 1346 02/15/22 1347 02/15/22 1455 02/15/22 1505  BP:   96/62   Pulse: 63 81 63   Resp: $Remo'18 18 14   'SZUAG$ Temp:    98.1 F (36.7 C)  TempSrc:    Oral  SpO2: 95% 96% 95%   Weight:      Height:       Eyes: PERRL, lids and conjunctivae normal ENMT: Mucous membranes are moist. Posterior pharynx clear of any exudate or lesions.Normal dentition.  Neck: normal, supple, no masses, no thyromegaly Respiratory: clear to auscultation bilaterally, no wheezing, no crackles. Normal respiratory effort. No accessory muscle use.  Cardiovascular: Regular rate and rhythm, no murmurs / rubs / gallops. No extremity edema. 2+ pedal pulses. No carotid bruits.  Abdomen: no tenderness, no masses palpated. No hepatosplenomegaly. Bowel sounds positive.  Musculoskeletal: no clubbing / cyanosis. No joint deformity upper and lower extremities. Good ROM, no contractures. Normal muscle tone.  Skin: no rashes, lesions, ulcers. No induration Neurologic: CN 2-12 grossly intact. Sensation intact, DTR normal. Strength 5/5 in all 4.  Psychiatric: Normal judgment and insight. Alert and oriented x 3. Normal mood.     Labs on Admission: I have personally reviewed following labs and imaging studies  CBC: Recent Labs  Lab 02/15/22 1128  WBC 5.3  NEUTROABS 1.6*  HGB 14.9  HCT 46.5  MCV 90.1  PLT 116   Basic Metabolic Panel: Recent Labs  Lab 02/15/22 1128  NA 141  K 3.4*  CL 112*  CO2 22  GLUCOSE 172*  BUN 34*  CREATININE 2.02*  CALCIUM 9.4  MG 2.1   GFR: Estimated Creatinine Clearance: 56.1 mL/min (A) (by C-G formula based on SCr of 2.02 mg/dL (H)). Liver Function Tests: No results for input(s): "AST", "ALT", "ALKPHOS", "BILITOT", "PROT", "ALBUMIN" in the last 168 hours. No results for input(s): "LIPASE", "AMYLASE" in the last 168 hours. No results for input(s): "AMMONIA" in the last 168 hours. Coagulation Profile: No results for input(s): "INR",  "PROTIME" in the last 168 hours. Cardiac Enzymes: No results for input(s): "CKTOTAL", "CKMB", "CKMBINDEX", "TROPONINI" in the last 168 hours. BNP (last 3 results) Recent Labs    10/27/21 1207  PROBNP 114   HbA1C: No results for input(s): "HGBA1C" in the last 72 hours. CBG: No results for input(s): "GLUCAP" in the last 168 hours. Lipid Profile: No results for input(s): "CHOL", "HDL", "LDLCALC", "TRIG", "CHOLHDL", "LDLDIRECT" in the last 72 hours. Thyroid Function Tests: No results for input(s): "TSH", "T4TOTAL", "FREET4", "T3FREE", "THYROIDAB" in the last 72 hours. Anemia Panel: No results for input(s): "VITAMINB12", "FOLATE", "FERRITIN", "TIBC", "IRON", "RETICCTPCT" in the last 72 hours. Urine analysis:    Component Value Date/Time   COLORURINE YELLOW 02/15/2022 1122   APPEARANCEUR CLEAR 02/15/2022 1122   LABSPEC 1.012 02/15/2022 1122   PHURINE 5.0 02/15/2022 1122   GLUCOSEU >=500 (A) 02/15/2022 1122   GLUCOSEU >=1000 (A) 12/14/2021 0911   HGBUR NEGATIVE 02/15/2022 1122   HGBUR negative 08/23/2009 0000   BILIRUBINUR NEGATIVE 02/15/2022 1122   KETONESUR NEGATIVE 02/15/2022 1122   PROTEINUR NEGATIVE 02/15/2022 1122   UROBILINOGEN 0.2 12/14/2021 0911   NITRITE NEGATIVE 02/15/2022 1122  LEUKOCYTESUR NEGATIVE 02/15/2022 1122    Radiological Exams on Admission: CT Head Wo Contrast  Result Date: 02/15/2022 CLINICAL DATA:  TIA dizziness. EXAM: CT HEAD WITHOUT CONTRAST TECHNIQUE: Contiguous axial images were obtained from the base of the skull through the vertex without intravenous contrast. RADIATION DOSE REDUCTION: This exam was performed according to the departmental dose-optimization program which includes automated exposure control, adjustment of the mA and/or kV according to patient size and/or use of iterative reconstruction technique. COMPARISON:  Head CT 02/05/2021 FINDINGS: Brain: There is no acute intracranial hemorrhage, extra-axial fluid collection, or acute infarct.  Parenchymal volume is normal. The ventricles are normal in size. Gray-white differentiation is preserved. Patchy hypodensity in the supratentorial white matter likely reflects underlying chronic small vessel ischemic change, similar to the prior study. There is a small remote lacunar infarct in the left cerebellar hemisphere, unchanged. There is no mass lesion.  There is no mass effect or midline shift. Vascular: No hyperdense vessel or unexpected calcification. Skull: Normal. Negative for fracture or focal lesion. Sinuses/Orbits: There is mild mucosal thickening in the paranasal sinuses. The globes and orbits are unremarkable. Other: None. IMPRESSION: No acute intracranial pathology. Electronically Signed   By: Valetta Mole M.D.   On: 02/15/2022 14:10   DG Chest 2 View  Result Date: 02/15/2022 CLINICAL DATA:  66 year old male presents with shortness of breath and dizziness. EXAM: CHEST - 2 VIEW COMPARISON:  February 05, 2021 FINDINGS: Trachea midline. Cardiomediastinal contours and hilar structures with increased prominence since prior imaging but in the setting of low lung volumes, likely within the range of normal. Low lung volumes accentuate mediastinal contours. Basilar airspace disease in the mid and lower chest bilaterally. No pneumothorax. No sign of pleural effusion. On limited assessment no acute skeletal process. IMPRESSION: Low lung volumes with bibasilar airspace disease in the mid and lower chest bilaterally. Findings likely reflect atelectatic changes though difficult to exclude developing infection including viral process. Electronically Signed   By: Zetta Bills M.D.   On: 02/15/2022 11:48    EKG: Independently reviewed.  Sinus rhythm, frequent PVCs, chronic RBBB, borderline prolonged QTc 505.  Assessment/Plan Principal Problem:   Near syncope  (please populate well all problems here in Problem List. (For example, if patient is on BP meds at home and you resume or decide to hold them, it  is a problem that needs to be her. Same for CAD, COPD, HLD and so on)  Near syncope -Likely secondary to iatrogenic hypotension, check TSH -Adjust BP meds, given his frequent PVCs, will leave him on Coreg.  Hold off torsemide, ACEI and spironolactone. -Check echocardiogram -Recheck orthostatic vital signs this evening and tomorrow morning -Cardiology follows. -Other DDx, no symptoms of vertigo, neurological exam nonfocal, low suspicion for central vertigo  Hypokalemia -PO replacement, check magnesium level  Frequent PVCs -Continue Coreg for now, will discuss with cardiology.  Positive troponins -Pattern is flat, not ACS, probably secondary to persistent hypotension and demanding ischemia and worsening kidney function. -Echocardiogram  AKI on CKD stage II -Clinically patient has symptoms signs of volume contraction, hold off diuresis -Check renal ultrasound CK and uric acid level -UA appears to be clean  Chronic HFpEF and HFrEF -Volume contracted and blood pressure low, hold off diuresis and ACEI and spironolactone for now.  IIDM -Sliding scale for now, hold off Jardiance given kidney function surge  HTN -As above  Morbid obesity -BMI 44, recommend calorie control.  DVT prophylaxis: Heparin subcu Code Status: Full code Family Communication: None  at bedside Disposition Plan: Expect less than 2 midnight hospital stay Consults called: Cardiology Admission status: Telemetry observation   Lequita Halt MD [facility] Pager [phone]  02/15/2022, 4:03 PM

## 2022-02-16 ENCOUNTER — Encounter (HOSPITAL_COMMUNITY): Payer: Self-pay | Admitting: Internal Medicine

## 2022-02-16 ENCOUNTER — Observation Stay (HOSPITAL_BASED_OUTPATIENT_CLINIC_OR_DEPARTMENT_OTHER): Payer: Medicare Other

## 2022-02-16 DIAGNOSIS — I959 Hypotension, unspecified: Secondary | ICD-10-CM

## 2022-02-16 DIAGNOSIS — R55 Syncope and collapse: Secondary | ICD-10-CM | POA: Diagnosis not present

## 2022-02-16 LAB — BASIC METABOLIC PANEL
Anion gap: 8 (ref 5–15)
BUN: 31 mg/dL — ABNORMAL HIGH (ref 8–23)
CO2: 25 mmol/L (ref 22–32)
Calcium: 9.7 mg/dL (ref 8.9–10.3)
Chloride: 111 mmol/L (ref 98–111)
Creatinine, Ser: 1.83 mg/dL — ABNORMAL HIGH (ref 0.61–1.24)
GFR, Estimated: 40 mL/min — ABNORMAL LOW (ref >60)
Glucose, Bld: 140 mg/dL — ABNORMAL HIGH (ref 70–99)
Potassium: 3.7 mmol/L (ref 3.5–5.1)
Sodium: 144 mmol/L (ref 135–145)

## 2022-02-16 LAB — ECHOCARDIOGRAM COMPLETE
AR max vel: 1.66 cm2
AV Area VTI: 1.69 cm2
AV Area mean vel: 1.58 cm2
AV Mean grad: 5 mmHg
AV Peak grad: 9.2 mmHg
Ao pk vel: 1.52 m/s
Area-P 1/2: 3.65 cm2
Calc EF: 43.9 %
Height: 73 in
S' Lateral: 4.7 cm
Single Plane A2C EF: 43.2 %
Single Plane A4C EF: 45.1 %
Weight: 5360 oz

## 2022-02-16 LAB — CBC
HCT: 49.5 % (ref 39.0–52.0)
Hemoglobin: 16.1 g/dL (ref 13.0–17.0)
MCH: 29.2 pg (ref 26.0–34.0)
MCHC: 32.5 g/dL (ref 30.0–36.0)
MCV: 89.8 fL (ref 80.0–100.0)
Platelets: 213 10*3/uL (ref 150–400)
RBC: 5.51 MIL/uL (ref 4.22–5.81)
RDW: 15.9 % — ABNORMAL HIGH (ref 11.5–15.5)
WBC: 5.2 10*3/uL (ref 4.0–10.5)
nRBC: 0 % (ref 0.0–0.2)

## 2022-02-16 LAB — TSH: TSH: 1.418 u[IU]/mL (ref 0.350–4.500)

## 2022-02-16 LAB — HIV ANTIBODY (ROUTINE TESTING W REFLEX): HIV Screen 4th Generation wRfx: NONREACTIVE

## 2022-02-16 LAB — URIC ACID: Uric Acid, Serum: 9.4 mg/dL — ABNORMAL HIGH (ref 3.7–8.6)

## 2022-02-16 LAB — GLUCOSE, CAPILLARY
Glucose-Capillary: 150 mg/dL — ABNORMAL HIGH (ref 70–99)
Glucose-Capillary: 126 mg/dL — ABNORMAL HIGH (ref 70–99)

## 2022-02-16 LAB — CK: Total CK: 155 U/L (ref 49–397)

## 2022-02-16 MED ORDER — PERFLUTREN LIPID MICROSPHERE
1.0000 mL | INTRAVENOUS | Status: AC | PRN
  Administered 2022-02-16: 3 mL via INTRAVENOUS

## 2022-02-16 MED ORDER — ISOSORBIDE MONONITRATE ER 30 MG PO TB24: 30.0000 mg | ORAL_TABLET | Freq: Every day | ORAL | 0 refills | Status: DC

## 2022-02-16 NOTE — Discharge Summary (Signed)
PatientPhysician Discharge Summary  Sarvesh Meddaugh YYT:[id number] DOB: 01-Feb-1956 DOA: 02/15/2022  PCP: Biagio Borg, MD  Admit date: 02/15/2022 Discharge date: 02/16/2022    Admitted From: Home Disposition: Home  Recommendations for Outpatient Follow-up:  Follow up with PCP in 1-2 weeks Please obtain BMP/CBC in one week Due to hypotension presumably causing presyncope/near syncope, we have reduced your Imdur dose from 3 tablets to 1 tablet and stopped your Aldactone and losartan. Please follow up with your PCP on the following pending results: Unresulted Labs (From admission, onward)    None         Home Health: None Equipment/Devices: None  Discharge Condition: Stable CODE STATUS: Full code Diet recommendation: Cardiac  Subjective: Patient seen and examined.  No complaints.  No more dizziness.  Feels much better.  HPI: [name] [name] is a 66 y.o. male with medical history significant of CAD status post stenting with ischemic cardiomyopathy, chronic combined HFrEF and HFpEF, HTN, IIDM, diabetic neuropathy, CKD stage II, frequent PVCs on beta-blocker, presented with frequent episodes of feeling lightheadedness.   Symptoms started about 1 week ago, most symptoms happened in the mornings, when patient getting out of his bed or standing up and started to walk, he likely will experience some lightheadedness and blurry vision, sometimes he had to try to hold something to maintain balance.  He does not measure his blood pressure at home.  And no recent blood pressure med changes.  3 weeks ago, he was started on Ozempic to treat uncontrolled diabetes and to lose weight.  He also reported vomiting episodes of palpitation/chest tightness 3 nights ago, lasted about few minutes and subside by itself.  He does noticed any increasing leg swelling or significant weight changes.   ED Course: Blood pressure borderline low, CT head negative for acute findings, chest x-ray bilateral lower field  atelectasis.  Blood work creatinine 2.0 compared to baseline 1.7.  EKG and telemetry monitor showed frequent PVCs.    Brief/Interim Summary: She was admitted due to near syncope.  Details below.  Near syncope -Likely secondary to iatrogenic hypotension, TSH normal.  We held his Imdur, losartan and Aldactone and only continued his Coreg and his blood pressure improved somewhat but is still on the low normal side.  We checked his orthostatics which were normal.  Transthoracic echo did not show any valvular heart disease.  It showed slightly reduced ejection fraction and grade 1 diastolic dysfunction which is stable from recent echo in the last year.  Based on this, it looks like patient's near syncope is likely due to overtreatment of the hypertension and based on that, we have discontinued his losartan, Aldactone and reduced his Imdur from 3 tablets to 1 tablet daily.  Patient aware.  Follow-up with cardiology.    AKI on CKD stage IIIa: Now renal function back to baseline.  Hypokalemia: Resolved.   Frequent PVCs -Continue Coreg .   Elevated troponin: Elevated but flat, no chest pain or shortness of breath, this is likely due to demand ischemia.  Echo ruled out wall motion abnormality.  Chronic HFpEF and HFrEF -Volume contracted and blood pressure low, hold off diuresis and ACEI and spironolactone for now.   IIDM -Sliding scale for now, hold off Jardiance given kidney function surge   HTN -As above   Morbid obesity -BMI 44, recommend calorie control.  Discharge plan was discussed with patient and/or family member and they verbalized understanding and agreed with it.  Discharge Diagnoses:  Principal Problem:   Near syncope  Active Problems:   DM2 (diabetes mellitus, type 2) ([place])   Hyperlipidemia   Acute renal failure superimposed on stage 3a chronic kidney disease ([place])   Hypotension    Discharge Instructions   Allergies as of 02/16/2022       Reactions   Penicillin G Other  (See Comments)   Did it involve swelling of the face/tongue/throat, SOB, or low BP?  N/A Did it involve sudden or severe rash/hives, skin peeling, or any reaction on the inside of your mouth or nose? N/A Did you need to seek medical attention at a hospital or doctor's office? N/A When did it last happen? Child If all above answers are "NO", may proceed with cephalosporin use.   Penicillins Other (See Comments)   Did it involve swelling of the face/tongue/throat, SOB, or low BP? N/A Did it involve sudden or severe rash/hives, skin peeling, or any reaction on the inside of your mouth or nose?N/A Did you need to seek medical attention at a hospital or doctor's office? N/A When did it last happen? Child      If all above answers are "NO", may proceed with cephalosporin use.        Medication List     STOP taking these medications    losartan 100 MG tablet Commonly known as: COZAAR   spironolactone 25 MG tablet Commonly known as: ALDACTONE       TAKE these medications    albuterol 108 (90 Base) MCG/ACT inhaler Commonly known as: VENTOLIN HFA Inhale 2 puffs into the lungs every 6 (six) hours as needed for wheezing or shortness of breath.   aspirin EC 81 MG tablet Take 81 mg by mouth daily.   atorvastatin 80 MG tablet Commonly known as: LIPITOR Take 1 tablet (80 mg total) by mouth at bedtime.   B-D SINGLE USE SWABS BUTTERFLY Pads Use up to 4 times a day to test blood sugars. Dx: E10.9, E11.9   blood glucose meter kit and supplies Dispense per insurance preference. Use as directed once daily. (FOR ICD-10  E11.9).   carvedilol 25 MG tablet Commonly known as: COREG TAKE 1 TABLET(25 MG) BY MOUTH TWICE DAILY WITH A MEAL   cetirizine 10 MG tablet Commonly known as: ZYRTEC TAKE 1 TABLET BY MOUTH DAILY   clopidogrel 75 MG tablet Commonly known as: PLAVIX Take 1 tablet (75 mg total) by mouth daily.   cyanocobalamin 1000 MCG tablet Commonly known as: VITAMIN B12 Take 1  tablet (1,000 mcg total) by mouth daily.   empagliflozin 25 MG Tabs tablet Commonly known as: Jardiance Take 1 tablet (25 mg total) by mouth daily before breakfast.   gabapentin 300 MG capsule Commonly known as: NEURONTIN Take 1 capsule (300 mg total) by mouth 3 (three) times daily as needed.   hydrocortisone cream 1 % Apply to affected area 2 times daily   isosorbide mononitrate 30 MG 24 hr tablet Commonly known as: IMDUR Take 1 tablet (30 mg total) by mouth daily. What changed: See the new instructions.   Lancets 33G Misc Use to test blood sugars up to 4 times a day. DX: E10.9, E11.9   Accu-Chek Softclix Lancets lancets Use as instructed E11.9   nitroGLYCERIN 0.4 MG SL tablet Commonly known as: NITROSTAT PLACE 1 TABLET UNDER THE TONGUE EVERY 5 MINUTES AS NEEDED FOR CHEST PAIN   pantoprazole 40 MG tablet Commonly known as: PROTONIX Take 1 tablet (40 mg total) by mouth daily.   PARoxetine 20 MG tablet Commonly known as: PAXIL  TAKE 1 TABLET(20 MG) BY MOUTH DAILY   potassium chloride 10 MEQ tablet Commonly known as: KLOR-CON TAKE 2 TABLETS(20 MEQ) BY MOUTH TWICE DAILY What changed: See the new instructions.   Semaglutide (1 MG/DOSE) 4 MG/3ML Sopn Inject 1 mg as directed once a week.   torsemide 20 MG tablet Commonly known as: DEMADEX TAKE 2 TABLETS BY MOUTH IN THE A.M, TAKE 1 TABLET BY MOUTH IN THE P.M. X'S 3 DAYS THEN REDUCE IT TO TAKING 1 TABLET BY MOUTH TWICE A DAY What changed:  how much to take how to take this when to take this additional instructions   traMADol 50 MG tablet Commonly known as: ULTRAM 1 tab by mouth four times per day as needed What changed:  how much to take how to take this when to take this reasons to take this   True Metrix Blood Glucose Test test strip Generic drug: glucose blood Use to test blood sugars up to 4 times a day. DX: E10.9, E.11.9   Accu-Chek Guide test strip Generic drug: glucose blood Use as instructed E11.9    True Metrix Level 1 Low Soln Use as needed. Dx: E10.9, E11.9        Follow-up Information     Biagio Borg, MD Follow up in 1 week(s).   Specialties: Internal Medicine, Radiology Contact information: [place] [address] [phone]         Sherren Mocha, MD .   Specialty: Cardiology Contact information: [phone] [place] [address] [phone]                Allergies  Allergen Reactions   Penicillin G Other (See Comments)    Did it involve swelling of the face/tongue/throat, SOB, or low BP?  N/A Did it involve sudden or severe rash/hives, skin peeling, or any reaction on the inside of your mouth or nose? N/A Did you need to seek medical attention at a hospital or doctor's office? N/A When did it last happen? Child If all above answers are "NO", may proceed with cephalosporin use.   Penicillins Other (See Comments)    Did it involve swelling of the face/tongue/throat, SOB, or low BP? N/A Did it involve sudden or severe rash/hives, skin peeling, or any reaction on the inside of your mouth or nose?N/A Did you need to seek medical attention at a hospital or doctor's office? N/A When did it last happen? Child      If all above answers are "NO", may proceed with cephalosporin use.    Consultations: None   Procedures/Studies: ECHOCARDIOGRAM COMPLETE  Result Date: 02/16/2022    ECHOCARDIOGRAM REPORT   Patient Name:   [name] [name] Date of Exam: 02/16/2022 Medical Rec #:  [id number]      Height:       73.0 in Accession #:    [id number]     Weight:       335.0 lb Date of Birth:  31-Oct-1955       BSA:          2.679 m Patient Age:    66 years       BP:           110/68 mmHg Patient Gender: M              HR:           68 bpm. Exam Location:  Inpatient Procedure: 2D Echo, Color Doppler, Cardiac Doppler and Intracardiac  Opacification Agent Indications:    Syncope  History:        Patient has prior history of  Echocardiogram examinations, most                 recent 02/06/2021. CAD, Stroke; Risk Factors:Diabetes,                 Hypertension and Dyslipidemia.  Sonographer:    Memory Argue Referring Phys: [id number] [place]  1. Global hypokinesis with inferobasal akinesis; overall mild LV dysfunction.  2. Left ventricular ejection fraction, by estimation, is 45 to 50%. The left ventricle has mildly decreased function. The left ventricle demonstrates global hypokinesis. The left ventricular internal cavity size was moderately dilated. Left ventricular diastolic parameters are consistent with Grade I diastolic dysfunction (impaired relaxation).  3. Right ventricular systolic function is normal. The right ventricular size is normal.  4. Left atrial size was mildly dilated.  5. The mitral valve is normal in structure. No evidence of mitral valve regurgitation. No evidence of mitral stenosis.  6. The aortic valve was not well visualized. Aortic valve regurgitation is not visualized. No aortic stenosis is present.  7. The inferior vena cava is normal in size with greater than 50% respiratory variability, suggesting right atrial pressure of 3 mmHg. Comparison(s): No significant change from prior study. FINDINGS  Left Ventricle: Left ventricular ejection fraction, by estimation, is 45 to 50%. The left ventricle has mildly decreased function. The left ventricle demonstrates global hypokinesis. The left ventricular internal cavity size was moderately dilated. There is no left ventricular hypertrophy. Left ventricular diastolic parameters are consistent with Grade I diastolic dysfunction (impaired relaxation). Right Ventricle: The right ventricular size is normal. Right ventricular systolic function is normal. Left Atrium: Left atrial size was mildly dilated. Right Atrium: Right atrial size was normal in size. Pericardium: There is no evidence of pericardial effusion. Mitral Valve: The mitral valve is normal in  structure. No evidence of mitral valve regurgitation. No evidence of mitral valve stenosis. Tricuspid Valve: The tricuspid valve is normal in structure. Tricuspid valve regurgitation is trivial. No evidence of tricuspid stenosis. Aortic Valve: The aortic valve was not well visualized. Aortic valve regurgitation is not visualized. No aortic stenosis is present. Aortic valve mean gradient measures 5.0 mmHg. Aortic valve peak gradient measures 9.2 mmHg. Aortic valve area, by VTI measures 1.69 cm. Pulmonic Valve: The pulmonic valve was normal in structure. Pulmonic valve regurgitation is not visualized. No evidence of pulmonic stenosis. Aorta: The aortic root is normal in size and structure. Venous: The inferior vena cava is normal in size with greater than 50% respiratory variability, suggesting right atrial pressure of 3 mmHg. IAS/Shunts: No atrial level shunt detected by color flow Doppler. Additional Comments: Global hypokinesis with inferobasal akinesis; overall mild LV dysfunction.  LEFT VENTRICLE PLAX 2D LVIDd:         6.50 cm      Diastology LVIDs:         4.70 cm      LV e' medial:    5.22 cm/s LV PW:         1.00 cm      LV E/e' medial:  10.6 LV IVS:        1.10 cm      LV e' lateral:   5.77 cm/s LVOT diam:     1.90 cm      LV E/e' lateral: 9.6 LV SV:         51 LV  SV Index:   19 LVOT Area:     2.84 cm  LV Volumes (MOD) LV vol d, MOD A2C: 222.0 ml LV vol d, MOD A4C: 164.0 ml LV vol s, MOD A2C: 126.0 ml LV vol s, MOD A4C: 90.1 ml LV SV MOD A2C:     96.0 ml LV SV MOD A4C:     164.0 ml LV SV MOD BP:      84.3 ml LEFT ATRIUM             Index        RIGHT ATRIUM           Index LA diam:        3.50 cm 1.31 cm/m   RA Area:     16.60 cm LA Vol (A2C):   90.8 ml 33.89 ml/m  RA Volume:   41.70 ml  15.57 ml/m LA Vol (A4C):   89.5 ml 33.41 ml/m LA Biplane Vol: 91.7 ml 34.23 ml/m  AORTIC VALVE AV Area (Vmax):    1.66 cm AV Area (Vmean):   1.58 cm AV Area (VTI):     1.69 cm AV Vmax:           152.00 cm/s AV  Vmean:          103.000 cm/s AV VTI:            0.300 m AV Peak Grad:      9.2 mmHg AV Mean Grad:      5.0 mmHg LVOT Vmax:         89.20 cm/s LVOT Vmean:        57.400 cm/s LVOT VTI:          0.179 m LVOT/AV VTI ratio: 0.60  AORTA Ao Root diam: 3.10 cm MITRAL VALVE MV Area (PHT): 3.65 cm    SHUNTS MV Decel Time: 208 msec    Systemic VTI:  0.18 m MV E velocity: 55.30 cm/s  Systemic Diam: 1.90 cm MV A velocity: 55.90 cm/s MV E/A ratio:  0.99 Kirk Ruths MD Electronically signed by Kirk Ruths MD Signature Date/Time: 02/16/2022/11:47:14 AM    Final    US RENAL  Result Date: 02/15/2022 CLINICAL DATA:  Acute renal failure. EXAM: RENAL / URINARY TRACT ULTRASOUND COMPLETE COMPARISON:  None Available. FINDINGS: Right Kidney: Renal measurements: 9.4 x 4.5 x 4.6 cm = volume: 102 mL. Echogenicity within normal limits. No mass or hydronephrosis visualized. Left Kidney: Renal measurements: 11.0 x 5.3 x 5.2 cm = volume: 159 mL. Echogenicity within normal limits. No mass or hydronephrosis visualized. Bladder: Appears normal for degree of bladder distention. Other: None. IMPRESSION: No hydronephrosis. Electronically Signed   By: Lovey Newcomer M.D.   On: 02/15/2022 17:07   CT Head Wo Contrast  Result Date: 02/15/2022 CLINICAL DATA:  TIA dizziness. EXAM: CT HEAD WITHOUT CONTRAST TECHNIQUE: Contiguous axial images were obtained from the base of the skull through the vertex without intravenous contrast. RADIATION DOSE REDUCTION: This exam was performed according to the departmental dose-optimization program which includes automated exposure control, adjustment of the mA and/or kV according to patient size and/or use of iterative reconstruction technique. COMPARISON:  Head CT 02/05/2021 FINDINGS: Brain: There is no acute intracranial hemorrhage, extra-axial fluid collection, or acute infarct. Parenchymal volume is normal. The ventricles are normal in size. Gray-white differentiation is preserved. Patchy hypodensity in the  supratentorial white matter likely reflects underlying chronic small vessel ischemic change, similar to the prior study. There is a small remote lacunar infarct  in the left cerebellar hemisphere, unchanged. There is no mass lesion.  There is no mass effect or midline shift. Vascular: No hyperdense vessel or unexpected calcification. Skull: Normal. Negative for fracture or focal lesion. Sinuses/Orbits: There is mild mucosal thickening in the paranasal sinuses. The globes and orbits are unremarkable. Other: None. IMPRESSION: No acute intracranial pathology. Electronically Signed   By: Valetta Mole M.D.   On: 02/15/2022 14:10   DG Chest 2 View  Result Date: 02/15/2022 CLINICAL DATA:  66 year old male presents with shortness of breath and dizziness. EXAM: CHEST - 2 VIEW COMPARISON:  February 05, 2021 FINDINGS: Trachea midline. Cardiomediastinal contours and hilar structures with increased prominence since prior imaging but in the setting of low lung volumes, likely within the range of normal. Low lung volumes accentuate mediastinal contours. Basilar airspace disease in the mid and lower chest bilaterally. No pneumothorax. No sign of pleural effusion. On limited assessment no acute skeletal process. IMPRESSION: Low lung volumes with bibasilar airspace disease in the mid and lower chest bilaterally. Findings likely reflect atelectatic changes though difficult to exclude developing infection including viral process. Electronically Signed   By: Zetta Bills M.D.   On: 02/15/2022 11:48     Discharge Exam: Vitals:   02/16/22 0330 02/16/22 0637  BP: 96/70 110/68  Pulse: 70 60  Resp: 20 18  Temp:  98.2 F (36.8 C)  SpO2: 96% 98%   Vitals:   02/16/22 0300 02/16/22 0330 02/16/22 0637 02/16/22 0800  BP: (!) 110/54 96/70 110/68   Pulse: (!) 33 70 60   Resp: (!) _0 Temp:   98.2 F (36.8 C)   TempSrc:    Oral  SpO2: 99% 96% 98%   Weight:      Height:        General: Pt is alert, awake, not in  acute distress Cardiovascular: RRR, S1/S2 +, no rubs, no gallops Respiratory: CTA bilaterally, no wheezing, no rhonchi Abdominal: Soft, NT, ND, bowel sounds + Extremities: no edema, no cyanosis    The results of significant diagnostics from this hospitalization (including imaging, microbiology, ancillary and laboratory) are listed below for reference.     Microbiology: No results found for this or any previous visit (from the past 240 hour(s)).   Labs: BNP (last 3 results) Recent Labs    02/15/22 1128  BNP 02.7   Basic Metabolic Panel: Recent Labs  Lab 02/15/22 1128 02/16/22 0817  NA 141 144  K 3.4* 3.7  CL 112* 111  CO2 22 25  GLUCOSE 172* 140*  BUN 34* 31*  CREATININE 2.02* 1.83*  CALCIUM 9.4 9.7  MG 2.1  --    Liver Function Tests: No results for input(s): "AST", "ALT", "ALKPHOS", "BILITOT", "PROT", "ALBUMIN" in the last 168 hours. No results for input(s): "LIPASE", "AMYLASE" in the last 168 hours. No results for input(s): "AMMONIA" in the last 168 hours. CBC: Recent Labs  Lab 02/15/22 1128 02/16/22 0817  WBC 5.3 5.2  NEUTROABS 1.6*  --   HGB 14.9 16.1  HCT 46.5 49.5  MCV 90.1 89.8  PLT 193 213   Cardiac Enzymes: Recent Labs  Lab 02/16/22 0817  CKTOTAL 155   BNP: Invalid input(s): "POCBNP" CBG: Recent Labs  Lab 02/15/22 1637 02/15/22 2206 02/16/22 0816 02/16/22 1204  GLUCAP 80 119* 150* 126*   D-Dimer No results for input(s): "DDIMER" in the last 72 hours. Hgb A1c No results for input(s): "HGBA1C" in the last 72 hours. Lipid Profile No results  for input(s): "CHOL", "HDL", "LDLCALC", "TRIG", "CHOLHDL", "LDLDIRECT" in the last 72 hours. Thyroid function studies Recent Labs    02/16/22 0817  TSH 1.418   Anemia work up No results for input(s): "VITAMINB12", "FOLATE", "FERRITIN", "TIBC", "IRON", "RETICCTPCT" in the last 72 hours. Urinalysis    Component Value Date/Time   COLORURINE YELLOW 02/15/2022 1122   APPEARANCEUR CLEAR  02/15/2022 1122   LABSPEC 1.012 02/15/2022 1122   PHURINE 5.0 02/15/2022 1122   GLUCOSEU >=500 (A) 02/15/2022 1122   GLUCOSEU >=1000 (A) 12/14/2021 0911   HGBUR NEGATIVE 02/15/2022 1122   HGBUR negative 08/23/2009 0000   BILIRUBINUR NEGATIVE 02/15/2022 1122   KETONESUR NEGATIVE 02/15/2022 1122   PROTEINUR NEGATIVE 02/15/2022 1122   UROBILINOGEN 0.2 12/14/2021 0911   NITRITE NEGATIVE 02/15/2022 1122   LEUKOCYTESUR NEGATIVE 02/15/2022 1122   Sepsis Labs Recent Labs  Lab 02/15/22 1128 02/16/22 0817  WBC 5.3 5.2   Microbiology No results found for this or any previous visit (from the past 240 hour(s)).   Time coordinating discharge: Over 30 minutes  SIGNED:   Darliss Cheney, MD  [facility] 02/16/2022, 12:40 PM *Please note that this is a verbal dictation therefore any spelling or grammatical errors are due to the "[place] [employer]" system interpretation. If 7PM-7AM, please contact night-coverage [email or web address]

## 2022-02-16 NOTE — Evaluation (Signed)
Occupational Therapy Evaluation Patient Details Name: [name] [name] MRN: [id number] DOB: 07/19/1955 Today's Date: 02/16/2022   History of Present Illness 66 y.o. male with medical history significant of CAD status post stenting with ischemic cardiomyopathy, chronic combined HFrEF and HFpEF, HTN, IIDM, diabetic neuropathy, CKD stage II, frequent PVCs on beta-blocker, presented with frequent episodes of feeling lightheadedness   Clinical Impression   Patient stating dizziness has resolved, he is up walking the halls independently with no acute OT needs.  Patient at his baseline for all ADL tasks.  He lives alone, but has friends that can assist him over the short term if needed.  No post acute rehab needs identified.  Recommend follow up as prescribed by MD.        Recommendations for follow up therapy are one component of a multi-disciplinary discharge planning process, led by the attending physician.  Recommendations may be updated based on patient status, additional functional criteria and insurance authorization.   Follow Up Recommendations  No OT follow up    Assistance Recommended at Discharge None  Patient can return home with the following      Functional Status Assessment  Patient has not had a recent decline in their functional status  Equipment Recommendations  None recommended by OT    Recommendations for Other Services       Precautions / Restrictions Precautions Precautions: None Restrictions Weight Bearing Restrictions: No      Mobility Bed Mobility               General bed mobility comments: up in recliner    Transfers Overall transfer level: Independent Equipment used: None                      Balance Overall balance assessment: No apparent balance deficits (not formally assessed)                                         ADL either performed or assessed with clinical judgement   ADL Overall ADL's : At baseline                                              Vision Patient Visual Report: No change from baseline       Perception Perception Perception: Not tested   Praxis Praxis Praxis: Not tested    Pertinent Vitals/Pain Pain Assessment Pain Assessment: No/denies pain     Hand Dominance Right   Extremity/Trunk Assessment Upper Extremity Assessment Upper Extremity Assessment: Overall WFL for tasks assessed   Lower Extremity Assessment Lower Extremity Assessment: Overall WFL for tasks assessed   Cervical / Trunk Assessment Cervical / Trunk Assessment: Normal   Communication Communication Communication: No difficulties   Cognition Arousal/Alertness: Awake/alert Behavior During Therapy: WFL for tasks assessed/performed Overall Cognitive Status: Within Functional Limits for tasks assessed                                       General Comments   VSS on RA    Exercises     Shoulder Instructions      Home Living Family/patient expects to be discharged to:: Private residence Living Arrangements: Alone Available  Help at Discharge: Friend(s);Available PRN/intermittently Type of Home: House Home Access: Level entry Entrance Stairs-Number of Steps: front is level, back entrance has 8 STE Entrance Stairs-Rails: Right;Left Home Layout: One level     Bathroom Shower/Tub: Teacher, early years/pre: Standard Bathroom Accessibility: Yes How Accessible: Accessible via walker Home Equipment: [place] - single point          Prior Functioning/Environment Prior Level of Function : Independent/Modified Independent;Driving                        OT Problem List: Decreased activity tolerance      OT Treatment/Interventions:      OT Goals(Current goals can be found in the care plan section) Acute Rehab OT Goals Patient Stated Goal: I just need to start walking more OT Goal Formulation: With patient Time For Goal Achievement:  02/19/22 Potential to Achieve Goals: Good  OT Frequency:      Co-evaluation              AM-PAC OT "6 Clicks" Daily Activity     Outcome Measure Help from another person eating meals?: None Help from another person taking care of personal grooming?: None Help from another person toileting, which includes using toliet, bedpan, or urinal?: None Help from another person bathing (including washing, rinsing, drying)?: None Help from another person to put on and taking off regular upper body clothing?: None Help from another person to put on and taking off regular lower body clothing?: None 6 Click Score: 24   End of Session Nurse Communication: Mobility status  Activity Tolerance: Patient tolerated treatment well Patient left: in chair;with call bell/phone within reach  OT Visit Diagnosis: Unsteadiness on feet (R26.81)                Time: 8676-1950 OT Time Calculation (min): 14 min Charges:  OT General Charges $OT Visit: 1 Visit OT Evaluation $OT Eval Moderate Complexity: 1 Mod  02/16/2022  RP, OTR/L  Acute Rehabilitation Services  Office:  [phone]   Metta Clines 02/16/2022, 10:45 AM

## 2022-02-16 NOTE — Progress Notes (Signed)
Discharge instructions. Pt transported off unit via WC to personal vehicle. All belongings, including cane, with pt. He remains alert and oriented; stable at baseline.

## 2022-02-16 NOTE — ED Notes (Signed)
Floor RN states ready for patient. No other questions at this time

## 2022-02-16 NOTE — Progress Notes (Signed)
PT Cancellation Note  Patient Details Name: [name] [name] MRN: [id number] DOB: Oct 30, 1955   Cancelled Treatment:    Reason Eval/Treat Not Completed: PT screened, no needs identified, will sign off (seen by OT and no P.T. needs at this time will screen)   [name] [name] 02/16/2022, 11:36 AM [place] Office: [phone]

## 2022-02-19 ENCOUNTER — Telehealth: Payer: Self-pay

## 2022-02-19 NOTE — Telephone Encounter (Signed)
Transition Care Management Follow-up Telephone Call Date of discharge and from where: [facility] 02-16-22 Dx: near syncope How have you been since you were released from the hospital? Doing better  Any questions or concerns? No  Items Reviewed: Did the pt receive and understand the discharge instructions provided? Yes  Medications obtained and verified? Yes  Other? No  Any new allergies since your discharge? No  Dietary orders reviewed? Yes Do you have support at home? Yes   Home Care and Equipment/Supplies: Were home health services ordered? not applicable If so, what is the name of the agency? na  Has the agency set up a time to come to the patient's home? not applicable Were any new equipment or medical supplies ordered?  No What is the name of the medical supply agency? na Were you able to get the supplies/equipment? not applicable Do you have any questions related to the use of the equipment or supplies? No  Functional Questionnaire: (I = Independent and D = Dependent) ADLs: I  Bathing/Dressing- I  Meal Prep- I  Eating- I  Maintaining continence- I  Transferring/Ambulation- I  Managing Meds- I  Follow up appointments reviewed:  PCP Hospital f/u appt confirmed? Yes  Scheduled to see Dr [name]  on 02-22-22 @ 340pm. [address] Hospital f/u appt confirmed? Yes  Scheduled to see Dr [name] on 02-27-22 @ 1120am. Are transportation arrangements needed? No  If their condition worsens, is the pt aware to call PCP or go to the Emergency Dept.? Yes Was the patient provided with contact information for the PCP's office or ED? Yes Was to pt encouraged to call back with questions or concerns? Yes

## 2022-02-22 ENCOUNTER — Ambulatory Visit (INDEPENDENT_AMBULATORY_CARE_PROVIDER_SITE_OTHER): Payer: Medicare Other | Admitting: Internal Medicine

## 2022-02-22 VITALS — BP 128/76 | HR 67 | Temp 98.0°F | Ht 73.0 in | Wt 323.0 lb

## 2022-02-22 DIAGNOSIS — Z23 Encounter for immunization: Secondary | ICD-10-CM | POA: Diagnosis not present

## 2022-02-22 DIAGNOSIS — E1159 Type 2 diabetes mellitus with other circulatory complications: Secondary | ICD-10-CM

## 2022-02-22 DIAGNOSIS — R21 Rash and other nonspecific skin eruption: Secondary | ICD-10-CM

## 2022-02-22 DIAGNOSIS — R55 Syncope and collapse: Secondary | ICD-10-CM

## 2022-02-22 MED ORDER — TRIAMCINOLONE ACETONIDE 0.1 % EX CREA: 1.0000 | TOPICAL_CREAM | Freq: Two times a day (BID) | CUTANEOUS | 0 refills | Status: DC

## 2022-02-22 NOTE — Progress Notes (Unsigned)
Patient ID: [name] [name], male   DOB: 04/28/1956, 66 y.o.   MRN: [id number]        Chief Complaint: follow up post hospn sept 7 - sept 8       HPI:  [name] [name] is a 65 y.o. male here post fall Due to hypotension presumably causing presyncope/near syncope, and have reduced the Imdur dose from 3 tablets to 1 tablet and stopped the Aldactone and losartan.  Pt denies chest pain, increased sob or doe, wheezing, orthopnea, PND, increased LE swelling, palpitations, dizziness or syncope.   Pt denies polydipsia, polyuria, or new focal neuro s/s.  Lost 10 lbs with ozempic.   Pt denies fever, wt loss, night sweats, loss of appetite, or other constitutional symptoms  No other new complaints today  Renal function improved at time of dc/.  Due for flu shot Transitional Care Management elements noted today: 1)  Date of D/C: as above 2)  Medication reconciliation:  done today at end visit 3)  Review of D/C summary or other information:  done today 4)  Review of need for f/u on pending diagnostic tests and treatments:  done today 5)  Review of need for Interaction with other providers who will assume or resume care of pt specific problems: done today 6)  Education of patient/family/guardian or caregiver: done today - with him today    Also, pt c/o irritated area post lowest occiput bilat with ingrown hairs.      Wt Readings from Last 3 Encounters:  02/22/22 (!) 323 lb (146.5 kg)  02/15/22 (!) 335 lb (152 kg)  12/14/21 (!) 335 lb 6 oz (152.1 kg)   BP Readings from Last 3 Encounters:  02/22/22 128/76  02/16/22 110/68  12/14/21 136/90         Past Medical History:  Diagnosis Date   CAD (coronary artery disease)    a. s/p BMS pRCA 2002; b. DES to pRCA & DES p/m RCA 2006; c. PCI/DES OM4 2007; d. PCI/CBA to RCA for ISR 10/2006; e.08/2012 STEMI/Cath/PCI:  LAD 95% >> PCI: Promus Prem DES  //  f. NSTEMI 10/15 >> LHC: mLAD stent ok, dLAD 70, OM1 CTO, OM2 stent ok, dOM2 90, OM3 30-40, dLCx 90, p-mRCA stent ok,  mRCA stent ok w/ 60-70 ISR, EF 50% >> med Rx // MV 1/17: no ischemia // Cath ([facility]) 05/2019: RCA 100 (CTO)    Combined systolic and diastolic CHF    a. Myoview 1/17: EF 35%  //  b. Echo 1/17 EF 45-50% // Echo 05/2019: EF 50-55 // Echocardiogram 8/21: EF 50, inf AK, post and mid inf HK, mod LVH, RVSP 25.3, mild to mod LAE, trivial MR    Depression    Diabetes mellitus ([place])    a. A1c 8.8 08/2012->Metformin initiated. => b. A1c (9/14): 6.6   Gastroesophageal reflux disease    History of nuclear stress test    a. Myoview 1/17: EF 35%, fixed inferior lateral defect consistent with infarct, no ischemia, intermediate risk   History of pneumonia    History of stroke 01/2021   Zio Monitor 11/22: NSR, avg HR 61; PVCs (1.9%); no AFib, no high grade HB, no ventricular arrhythmias   HTN (hypertension)    Hx of NSTEMI    Hyperlipidemia    Ischemic cardiomyopathy    a. EF 40%; improved to normal;  b. 08/2012 Echo: EF 50-55%, mod LVH.//  c. Echo 9/16: Inf HK, mild LVH, EF 55%, mild LAE, normal RVF, mild RAE, PASP 35  mmHg  //  d. Echo 1/17: EF 45-50%, inferior HK, mild BAE, PASP 33 mmHg   Obesity    OSA (obstructive sleep apnea)    Does not use CPAP as of 05/2011   Tobacco abuse    Past Surgical History:  Procedure Laterality Date   CORONARY ANGIOPLASTY WITH STENT PLACEMENT     CORONARY STENT PLACEMENT  2009   LEFT HEART CATHETERIZATION WITH CORONARY ANGIOGRAM N/A 08/31/2012   Procedure: LEFT HEART CATHETERIZATION WITH CORONARY ANGIOGRAM;  Surgeon: Burnell Blanks, MD;  Location: [facility] CATH LAB;  Service: Cardiovascular;  Laterality: N/A;   LEFT HEART CATHETERIZATION WITH CORONARY ANGIOGRAM N/A 11/16/2013   Procedure: LEFT HEART CATHETERIZATION WITH CORONARY ANGIOGRAM;  Surgeon: Blane Ohara, MD;  Location: [facility] CATH LAB;  Service: Cardiovascular;  Laterality: N/A;   LEFT HEART CATHETERIZATION WITH CORONARY ANGIOGRAM N/A 03/15/2014   Procedure: LEFT HEART CATHETERIZATION WITH CORONARY ANGIOGRAM;   Surgeon: Peter M Martinique, MD;  Location: [facility] CATH LAB;  Service: Cardiovascular;  Laterality: N/A;   PERCUTANEOUS CORONARY STENT INTERVENTION (PCI-S) Right 08/31/2012   Procedure: PERCUTANEOUS CORONARY STENT INTERVENTION (PCI-S);  Surgeon: Burnell Blanks, MD;  Location: [facility] CATH LAB;  Service: Cardiovascular;  Laterality: Right;   PERCUTANEOUS CORONARY STENT INTERVENTION (PCI-S)  11/16/2013   Procedure: PERCUTANEOUS CORONARY STENT INTERVENTION (PCI-S);  Surgeon: Blane Ohara, MD;  Location: [facility] CATH LAB;  Service: Cardiovascular;;    reports that he has quit smoking. His smoking use included cigarettes. He has a 15.00 pack-year smoking history. He has never used smokeless tobacco. He reports that he does not drink alcohol and does not use drugs. family history includes Cancer in his mother; Coronary artery disease in an other family member; Depression in his mother; Hypertension in his mother; Other in his father. Allergies  Allergen Reactions   Penicillin G Other (See Comments)    Did it involve swelling of the face/tongue/throat, SOB, or low BP?  N/A Did it involve sudden or severe rash/hives, skin peeling, or any reaction on the inside of your mouth or nose? N/A Did you need to seek medical attention at a hospital or doctor's office? N/A When did it last happen? Child If all above answers are "NO", may proceed with cephalosporin use.   Penicillins Other (See Comments)    Did it involve swelling of the face/tongue/throat, SOB, or low BP? N/A Did it involve sudden or severe rash/hives, skin peeling, or any reaction on the inside of your mouth or nose?N/A Did you need to seek medical attention at a hospital or doctor's office? N/A When did it last happen? Child      If all above answers are "NO", may proceed with cephalosporin use.   Current Outpatient Medications on File Prior to Visit  Medication Sig Dispense Refill   Accu-Chek Softclix Lancets lancets Use as instructed E11.9 100 each  12   albuterol (VENTOLIN HFA) 108 (90 Base) MCG/ACT inhaler Inhale 2 puffs into the lungs every 6 (six) hours as needed for wheezing or shortness of breath. 8 g 11   Alcohol Swabs (B-D SINGLE USE SWABS BUTTERFLY) PADS Use up to 4 times a day to test blood sugars. Dx: E10.9, E11.9 100 each 11   aspirin 81 MG EC tablet Take 81 mg by mouth daily.     atorvastatin (LIPITOR) 80 MG tablet Take 1 tablet (80 mg total) by mouth at bedtime. 90 tablet 2   Blood Glucose Calibration (TRUE METRIX LEVEL 1) Low SOLN Use as needed. Dx:  E10.9, E11.9 1 each 0   blood glucose meter kit and supplies Dispense per insurance preference. Use as directed once daily. (FOR ICD-10  E11.9). 1 each 0   carvedilol (COREG) 25 MG tablet TAKE 1 TABLET(25 MG) BY MOUTH TWICE DAILY WITH A MEAL 180 tablet 3   clopidogrel (PLAVIX) 75 MG tablet Take 1 tablet (75 mg total) by mouth daily. 90 tablet 3   empagliflozin (JARDIANCE) 25 MG TABS tablet Take 1 tablet (25 mg total) by mouth daily before breakfast. 100 tablet 3   gabapentin (NEURONTIN) 300 MG capsule Take 1 capsule (300 mg total) by mouth 3 (three) times daily as needed. 270 capsule 1   glucose blood (ACCU-CHEK GUIDE) test strip Use as instructed E11.9 100 each 12   glucose blood (TRUE METRIX BLOOD GLUCOSE TEST) test strip Use to test blood sugars up to 4 times a day. DX: E10.9, E.11.9 100 each 12   isosorbide mononitrate (IMDUR) 30 MG 24 hr tablet Take 1 tablet (30 mg total) by mouth daily. 30 tablet 0   Lancets 33G MISC Use to test blood sugars up to 4 times a day. DX: E10.9, E11.9 100 each 6   nitroGLYCERIN (NITROSTAT) 0.4 MG SL tablet PLACE 1 TABLET UNDER THE TONGUE EVERY 5 MINUTES AS NEEDED FOR CHEST PAIN 25 tablet 5   pantoprazole (PROTONIX) 40 MG tablet Take 1 tablet (40 mg total) by mouth daily. 90 tablet 3   PARoxetine (PAXIL) 20 MG tablet TAKE 1 TABLET(20 MG) BY MOUTH DAILY 90 tablet 3   potassium chloride (KLOR-CON) 10 MEQ tablet TAKE 2 TABLETS(20 MEQ) BY MOUTH TWICE  DAILY (Patient taking differently: Take 20 mEq by mouth 2 (two) times daily.) 360 tablet 2   Semaglutide, 1 MG/DOSE, 4 MG/3ML SOPN Inject 1 mg as directed once a week. 9 mL 3   torsemide (DEMADEX) 20 MG tablet TAKE 2 TABLETS BY MOUTH IN THE A.M, TAKE 1 TABLET BY MOUTH IN THE P.M. X'S 3 DAYS THEN REDUCE IT TO TAKING 1 TABLET BY MOUTH TWICE A DAY (Patient taking differently: Take 20 mg by mouth 2 (two) times daily.) 180 tablet 3   traMADol (ULTRAM) 50 MG tablet 1 tab by mouth four times per day as needed 120 tablet 2   vitamin B-12 (CYANOCOBALAMIN) 1000 MCG tablet Take 1 tablet (1,000 mcg total) by mouth daily. 90 tablet 3   No current facility-administered medications on file prior to visit.        ROS:  All others reviewed and negative.  Objective        PE:  BP 128/76 (BP Location: Right Arm, Patient Position: Sitting, Cuff Size: Large)   Pulse 67   Temp 98 F (36.7 C) (Oral)   Ht $R'6\' 1"'uR$  (1.854 m)   Wt (!) 323 lb (146.5 kg)   SpO2 98%   BMI 42.61 kg/m                 Constitutional: Pt appears in NAD               HENT: Head: NCAT.                Right Ear: External ear normal.                 Left Ear: External ear normal.                Eyes: . Pupils are equal, round, and reactive to light. Conjunctivae and EOM are normal  Nose: without d/c or deformity               Neck: Neck supple. Gross normal ROM               Cardiovascular: Normal rate and regular rhythm.                 Pulmonary/Chest: Effort normal and breath sounds without rales or wheezing.                Abd:  Soft, NT, ND, + BS, no organomegaly               Neurological: Pt is alert. At baseline orientation, motor grossly intact               Skin:  LE edema - none, bilateral lowest occiput with multiple ingrown hairs at the neck line tender bumps erythema               Psychiatric: Pt behavior is normal without agitation   Micro: none  Cardiac tracings I have personally interpreted today:   none  Pertinent Radiological findings (summarize): none   Lab Results  Component Value Date   WBC 5.2 02/16/2022   HGB 16.1 02/16/2022   HCT 49.5 02/16/2022   PLT 213 02/16/2022   GLUCOSE 140 (H) 02/16/2022   CHOL 131 12/14/2021   TRIG 53.0 12/14/2021   HDL 46.30 12/14/2021   LDLCALC 74 12/14/2021   ALT 14 12/14/2021   AST 17 12/14/2021   NA 144 02/16/2022   K 3.7 02/16/2022   CL 111 02/16/2022   CREATININE 1.83 (H) 02/16/2022   BUN 31 (H) 02/16/2022   CO2 25 02/16/2022   TSH 1.418 02/16/2022   PSA 1.00 07/12/2021   INR 1.0 02/05/2021   HGBA1C 7.6 (H) 12/14/2021   MICROALBUR 64.1 (H) 06/23/2020   Assessment/Plan:  [name] [name] is a 66 y.o. Black or African American [2] male with  has a past medical history of CAD (coronary artery disease), Combined systolic and diastolic CHF, Depression, Diabetes mellitus ([place]), Gastroesophageal reflux disease, History of nuclear stress test, History of pneumonia, History of stroke (01/2021), HTN (hypertension), Hx of NSTEMI, Hyperlipidemia, Ischemic cardiomyopathy, Obesity, OSA (obstructive sleep apnea), and Tobacco abuse.  Near syncope Episodes resovled with reduced meds and improved BP, pt doing well post hospn .BP stable, will continue current med tx  DM2 (diabetes mellitus, type 2) ([place]) Lab Results  Component Value Date   HGBA1C 7.6 (H) 12/14/2021   Stable, pt to continue current medical treatment jardiance 25 mg   Rash Mild, low occiput irritant, for triam cr prn  Followup: Return in about 4 months (around 06/18/2022).  Cathlean Cower, MD 02/25/2022 10:34 AM [facility]

## 2022-02-22 NOTE — Patient Instructions (Addendum)
You had the flu shot today  Please take all new medication as prescribed - the cream for the post neck  Please continue all other medications as before, and refills have been done if requested.  Please have the pharmacy call with any other refills you may need.  Please continue your efforts at being more active, low cholesterol diet, and weight control.  Please keep your appointments with your specialists as you may have planned  Please make an Appointment to return in Jun 18 2022, or sooner if needed

## 2022-02-25 ENCOUNTER — Encounter: Payer: Self-pay | Admitting: Internal Medicine

## 2022-02-25 DIAGNOSIS — R21 Rash and other nonspecific skin eruption: Secondary | ICD-10-CM | POA: Insufficient documentation

## 2022-02-25 NOTE — Assessment & Plan Note (Signed)
Mild, low occiput irritant, for triam cr prn

## 2022-02-25 NOTE — Assessment & Plan Note (Signed)
Lab Results  Component Value Date   HGBA1C 7.6 (H) 12/14/2021   Stable, pt to continue current medical treatment jardiance 25 mg

## 2022-02-25 NOTE — Assessment & Plan Note (Signed)
Episodes resovled with reduced meds and improved BP, pt doing well post hospn .BP stable, will continue current med tx

## 2022-02-26 NOTE — Progress Notes (Unsigned)
Cardiology Office Note:    Date:  02/27/2022   ID:  [name] [name], DOB 11/06/1955, MRN [id number]  PCP:  Biagio Borg, MD  [facility] Providers Cardiologist:  Sherren Mocha, MD Cardiology APP:  Sharmon Revere    Referring MD: Biagio Borg, MD   Chief Complaint:  Follow-up for CAD, CHF    Patient Profile: Coronary artery disease S/p PCI to RCA and PL1 S/p cutting POBA to RCA 2/2 ISR in 5/08 S/p ant STEMI in 3/14 >> PCI: DES to LAD S/p NSTEMI in 6/15 >> PCI: DES to [place] S/p NSTEMI 10/15 >> Med Rx Myoview 1/17: no ischemia Cath at [facility] 05/2019: RCA 100 CTO Heart failure with preserved ejection fraction  Ischemic CM Echocardiogram 05/2019 ([facility]): EF 50-55 Echocardiogram 8/21: EF 50, inf AK Echo 9/23: EF 45-50, GR 1 DD Supraventricular tachycardia Monitor 9/21: Longest 17 seconds beta-blocker increased  PVCs (monitor 9/21: 6% burden) beta-blocker Rx  Hypertension Hyperlipidemia Chronic kidney disease Diabetes mellitus OSA GERD Tobacco abuse Aortic atherosclerosis  Hx of TIA in Aug 22  Prior CV Studies: ECHO COMPLETE WITH IMAGING ENHANCING AGENT 02/16/2022 Inferobasal HK, EF 45-50, GR 1 DD, normal RVSF, mild LAE    CARDIAC TELEMETRY MONITORING-INTERPRETATION ONLY 04/05/2021 Summary: The basic rhythm is normal sinus with an average heart rate of 61 bpm.  There are occasional PVCs with an overall burden of 1.9%.  There are no prolonged ventricular arrhythmias.  There is no atrial fibrillation or flutter identified.  There is no high-grade AV block noted.    ECHOCARDIOGRAM 02/06/21 EF 45-50, global HK, moderate LVH, G1 DD, RVSP 15.1, mild LAE, trivial AI, dilated aortic root (37 mm)   ZIO monitor 02/2020 NSR, Avg HR 59; No AFib/Flutter; PVCs 6% burden; occ PACs, rare Supraventricular Tachycardia runs   Echocardiogram 01/14/20 EF 50, inf AK , post and inf HK, mod LVH, normal RVSF, RVSP 25.3, mild to mod LAE, trivial MR,    Cardiac catheterization  05/14/2019 ([facility]) LM normal LAD diff irregs LCx diffuse irregs RCA mid 100 (CTO)   Echocardiogram 05/13/2019 ([facility]) Mod conc LVH, inf AK and likely post HK, EF 50-55   Myoview 06/16/15 Intermediate risk stress nuclear study with a large, severe, predominantly fixed inferior lateral defect consistent with prior infarct; no significant ischemia; EF 35 but visually appears better; akinesis of the basal and mid inferior lateral wall; mild LVE; suggest echo to better assess LV function. Study intermediate risk due to reduced LV function.    Carotid US (4/13):  Bilateral: no ICA stenosis  History of Present Illness:   [name] [name] is a 66 y.o. male with the above problem list.  He was admitted 9/7-9/8 for near syncope. Echocardiogram showed stable EF. His hsTrops were minimally elevated w/o trend. His BP was low and his Creatinine was increased. His Losartan and Spironolactone were DC'd. His Imdur dose was reduced. Creatinine was improved at DC. He followed up with Dr. [name] 9/14 and his BP was controlled at that time. He returns for f/u.   He is here alone today.  Overall, he is doing well without chest pain, syncope, orthopnea, leg edema.  He does note chronic shortness of breath with exertion.  This has not really gotten any worse.  He has lost about 10 pounds since last visit.  He has been placed on semaglutide for diabetes and has noted weight loss with this.    Past Medical History:  Diagnosis Date   CAD (coronary artery disease)  a. s/p BMS pRCA 2002; b. DES to pRCA & DES p/m RCA 2006; c. PCI/DES OM4 2007; d. PCI/CBA to RCA for ISR 10/2006; e.08/2012 STEMI/Cath/PCI:  LAD 95% >> PCI: Promus Prem DES  //  f. NSTEMI 10/15 >> LHC: mLAD stent ok, dLAD 70, OM1 CTO, OM2 stent ok, dOM2 90, OM3 30-40, dLCx 56, p-mRCA stent ok, mRCA stent ok w/ 60-70 ISR, EF 50% >> med Rx // MV 1/17: no ischemia // Cath ([facility]) 05/2019: RCA 100 (CTO)    Combined systolic and diastolic CHF    a. Myoview 1/17: EF 35%  //  b.  Echo 1/17 EF 45-50% // Echo 05/2019: EF 50-55 // Echocardiogram 8/21: EF 50, inf AK, post and mid inf HK, mod LVH, RVSP 25.3, mild to mod LAE, trivial MR    Depression    Diabetes mellitus ([place])    a. A1c 8.8 08/2012->Metformin initiated. => b. A1c (9/14): 6.6   Gastroesophageal reflux disease    History of nuclear stress test    a. Myoview 1/17: EF 35%, fixed inferior lateral defect consistent with infarct, no ischemia, intermediate risk   History of pneumonia    History of stroke 01/2021   Zio Monitor 11/22: NSR, avg HR 61; PVCs (1.9%); no AFib, no high grade HB, no ventricular arrhythmias   HTN (hypertension)    Hx of NSTEMI    Hyperlipidemia    Ischemic cardiomyopathy    a. EF 40%; improved to normal;  b. 08/2012 Echo: EF 50-55%, mod LVH.//  c. Echo 9/16: Inf HK, mild LVH, EF 55%, mild LAE, normal RVF, mild RAE, PASP 35 mmHg  //  d. Echo 1/17: EF 45-50%, inferior HK, mild BAE, PASP 33 mmHg   Obesity    OSA (obstructive sleep apnea)    Does not use CPAP as of 05/2011   Tobacco abuse    Current Medications: Current Meds  Medication Sig   Accu-Chek Softclix Lancets lancets Use as instructed E11.9   albuterol (VENTOLIN HFA) 108 (90 Base) MCG/ACT inhaler Inhale 2 puffs into the lungs every 6 (six) hours as needed for wheezing or shortness of breath.   Alcohol Swabs (B-D SINGLE USE SWABS BUTTERFLY) PADS Use up to 4 times a day to test blood sugars. Dx: E10.9, E11.9   aspirin 81 MG EC tablet Take 81 mg by mouth daily.   atorvastatin (LIPITOR) 80 MG tablet Take 1 tablet (80 mg total) by mouth at bedtime.   Blood Glucose Calibration (TRUE METRIX LEVEL 1) Low SOLN Use as needed. Dx: E10.9, E11.9   blood glucose meter kit and supplies Dispense per insurance preference. Use as directed once daily. (FOR ICD-10  E11.9).   carvedilol (COREG) 25 MG tablet TAKE 1 TABLET(25 MG) BY MOUTH TWICE DAILY WITH A MEAL   clopidogrel (PLAVIX) 75 MG tablet Take 1 tablet (75 mg total) by mouth daily.    empagliflozin (JARDIANCE) 25 MG TABS tablet Take 1 tablet (25 mg total) by mouth daily before breakfast.   gabapentin (NEURONTIN) 300 MG capsule Take 1 capsule (300 mg total) by mouth 3 (three) times daily as needed.   glucose blood (ACCU-CHEK GUIDE) test strip Use as instructed E11.9   glucose blood (TRUE METRIX BLOOD GLUCOSE TEST) test strip Use to test blood sugars up to 4 times a day. DX: E10.9, E.11.9   isosorbide mononitrate (IMDUR) 30 MG 24 hr tablet Take 1 tablet (30 mg total) by mouth daily.   Lancets 33G MISC Use to test blood sugars up  to 4 times a day. DX: E10.9, E11.9   nitroGLYCERIN (NITROSTAT) 0.4 MG SL tablet PLACE 1 TABLET UNDER THE TONGUE EVERY 5 MINUTES AS NEEDED FOR CHEST PAIN   pantoprazole (PROTONIX) 40 MG tablet Take 1 tablet (40 mg total) by mouth daily.   PARoxetine (PAXIL) 20 MG tablet TAKE 1 TABLET(20 MG) BY MOUTH DAILY   potassium chloride (KLOR-CON) 10 MEQ tablet TAKE 2 TABLETS(20 MEQ) BY MOUTH TWICE DAILY   Semaglutide, 1 MG/DOSE, 4 MG/3ML SOPN Inject 1 mg as directed once a week.   torsemide (DEMADEX) 20 MG tablet TAKE 2 TABLETS BY MOUTH IN THE A.M, TAKE 1 TABLET BY MOUTH IN THE P.M. X'S 3 DAYS THEN REDUCE IT TO TAKING 1 TABLET BY MOUTH TWICE A DAY   traMADol (ULTRAM) 50 MG tablet 1 tab by mouth four times per day as needed   triamcinolone cream (KENALOG) 0.1 % Apply 1 Application topically 2 (two) times daily.   vitamin B-12 (CYANOCOBALAMIN) 1000 MCG tablet Take 1 tablet (1,000 mcg total) by mouth daily.    Allergies:   Penicillin g and Penicillins   Social History   Tobacco Use   Smoking status: Former    Packs/day: 0.50    Years: 30.00    Total pack years: 15.00    Types: Cigarettes   Smokeless tobacco: Never   Tobacco comments:    trying to cut down  Vaping Use   Vaping Use: Never used  Substance Use Topics   Alcohol use: No   Drug use: No    Comment: remote marijuana use    Family Hx: The patient's family history includes Cancer in his mother;  Coronary artery disease in an other family member; Depression in his mother; Hypertension in his mother; Other in his father. There is no history of Heart attack or Stroke.  Review of Systems  Gastrointestinal:  Negative for hematochezia.  Genitourinary:  Negative for hematuria.     EKGs/Labs/Other Test Reviewed:    EKG:  EKG is not ordered today.  The ekg ordered today demonstrates n/a  Recent Labs: 10/27/2021: NT-Pro BNP 114 12/14/2021: ALT 14 02/15/2022: B Natriuretic Peptide 49.5; Magnesium 2.1 02/16/2022: BUN 31; Creatinine, Ser 1.83; Hemoglobin 16.1; Platelets 213; Potassium 3.7; Sodium 144; TSH 1.418   Recent Lipid Panel Recent Labs    12/14/21 0911  CHOL 131  TRIG 53.0  HDL 46.30  VLDL 10.6  LDLCALC 74     Risk Assessment/Calculations/Metrics:              Physical Exam:    VS:  BP 112/60   Pulse (!) 57   Ht $R'6\' 1"'OF$  (1.854 m)   Wt (!) 324 lb 12.8 oz (147.3 kg)   SpO2 95%   BMI 42.85 kg/m     Wt Readings from Last 3 Encounters:  02/27/22 (!) 324 lb 12.8 oz (147.3 kg)  02/22/22 (!) 323 lb (146.5 kg)  02/15/22 (!) 335 lb (152 kg)    Constitutional:      Appearance: Healthy appearance. Not in distress.  Neck:     Vascular: No JVR. JVD normal.  Pulmonary:     Effort: Pulmonary effort is normal.     Breath sounds: No wheezing. No rales.  Cardiovascular:     Normal rate. Regular rhythm. Normal S1. Normal S2.      Murmurs: There is no murmur.  Edema:    Peripheral edema absent.  Abdominal:     Palpations: Abdomen is soft.  Skin:  General: Skin is warm and dry.  Neurological:     General: No focal deficit present.     Mental Status: Alert and oriented to person, place and time.         ASSESSMENT & PLAN:   Aortic atherosclerosis (HCC) Continue aspirin, statin therapy.  Chronic heart failure with preserved ejection fraction (HFpEF) (HCC) EF was recently assessed during recent hospitalization with echocardiogram.  Ejection fraction remains stable at  45-50.  Overall, volume status appears to be stable.  His BNP in the hospital was normal.  His weight has decreased since starting on semaglutide.  Lungs are clear on exam.  He does have prior history of smoking as well as asthma.  I suspect his dyspnea is multifactorial.  I encouraged him to increase his activity.  He has albuterol to use as needed.  Continue current dose of torsemide 20 mg twice daily.  Of note, he was taken off of spironolactone in the hospital.  Given his renal insufficiency, I would not try to resume this for now.  He is already on Jardiance 25 mg daily for diabetes.  Obtain follow-up BMET, magnesium today.  Follow-up in 6 months.  Coronary artery disease involving native coronary artery of native heart without angina pectoris History of multiple PCI procedures.  Cardiac catheterization in 2020 demonstrated a chronically occluded RCA and no significant disease in the L CX or LAD.  He is doing well without anginal symptoms.  Continue aspirin 81 mg daily, Lipitor 80 mg daily, carvedilol 25 mg twice daily, Plavix 75 mg daily, Imdur 30 mg daily.  Hyperlipidemia Recent LDL 74.  Continue Lipitor 80 mg daily.  If LDL remains above 70, consider adding ezetimibe.  PVC's (premature ventricular contractions) Prior monitor in 2021 demonstrated 6% burden.  He does have some premature beats on exam and had PACs noted on EKG in the hospital.  Obtain follow-up BMET to rule out hypokalemia.  Also obtain magnesium level.  Continue carvedilol 25 mg twice daily.  Essential hypertension Recent admission for symptomatic hypotension.  Blood pressure is now well controlled.  Continue carvedilol 25 mg twice daily, Imdur 30 mg daily.  CKD (chronic kidney disease), stage III (HCC) Creatinine was increased on admission during recent hospitalization for hypotension.  Creatinine was improved prior to discharge.  Obtain follow-up BMET today.             Dispo:  Return in about 6 months (around 08/28/2022)  for Routine Follow Up, w/ [name], PA-C.   Medication Adjustments/Labs and Tests Ordered: Current medicines are reviewed at length with the patient today.  Concerns regarding medicines are outlined above.  Tests Ordered: Orders Placed This Encounter  Procedures   Basic metabolic panel   Magnesium   Medication Changes: No orders of the defined types were placed in this encounter.  Signed, [name], PA-C  02/27/2022 1:10 PM    [facility] [address], [place], [place]  [address] Phone: [phone]; Fax: [phone]

## 2022-02-27 ENCOUNTER — Ambulatory Visit: Payer: Medicare Other | Attending: Physician Assistant | Admitting: Physician Assistant

## 2022-02-27 ENCOUNTER — Encounter: Payer: Self-pay | Admitting: Physician Assistant

## 2022-02-27 VITALS — BP 112/60 | HR 57 | Ht 73.0 in | Wt 324.8 lb

## 2022-02-27 DIAGNOSIS — N1831 Chronic kidney disease, stage 3a: Secondary | ICD-10-CM | POA: Diagnosis not present

## 2022-02-27 DIAGNOSIS — I2510 Atherosclerotic heart disease of native coronary artery without angina pectoris: Secondary | ICD-10-CM

## 2022-02-27 DIAGNOSIS — I10 Essential (primary) hypertension: Secondary | ICD-10-CM | POA: Diagnosis not present

## 2022-02-27 DIAGNOSIS — E7800 Pure hypercholesterolemia, unspecified: Secondary | ICD-10-CM

## 2022-02-27 DIAGNOSIS — I5032 Chronic diastolic (congestive) heart failure: Secondary | ICD-10-CM

## 2022-02-27 DIAGNOSIS — I493 Ventricular premature depolarization: Secondary | ICD-10-CM

## 2022-02-27 DIAGNOSIS — I700 Atherosclerosis of aorta: Secondary | ICD-10-CM | POA: Diagnosis not present

## 2022-02-27 LAB — BASIC METABOLIC PANEL
BUN/Creatinine Ratio: 14 (ref 10–24)
BUN: 24 mg/dL (ref 8–27)
CO2: 29 mmol/L (ref 20–29)
Calcium: 10.1 mg/dL (ref 8.6–10.2)
Chloride: 103 mmol/L (ref 96–106)
Creatinine, Ser: 1.69 mg/dL — ABNORMAL HIGH (ref 0.76–1.27)
Glucose: 94 mg/dL (ref 70–99)
Potassium: 3.6 mmol/L (ref 3.5–5.2)
Sodium: 146 mmol/L — ABNORMAL HIGH (ref 134–144)
eGFR: 44 mL/min/{1.73_m2} — ABNORMAL LOW (ref >59)

## 2022-02-27 LAB — MAGNESIUM: Magnesium: 2.4 mg/dL — ABNORMAL HIGH (ref 1.6–2.3)

## 2022-02-27 NOTE — Assessment & Plan Note (Signed)
Continue aspirin, statin therapy 

## 2022-02-27 NOTE — Assessment & Plan Note (Signed)
Recent LDL 74.  Continue Lipitor 80 mg daily.  If LDL remains above 70, consider adding ezetimibe.

## 2022-02-27 NOTE — Assessment & Plan Note (Signed)
Recent admission for symptomatic hypotension.  Blood pressure is now well controlled.  Continue carvedilol 25 mg twice daily, Imdur 30 mg daily.

## 2022-02-27 NOTE — Assessment & Plan Note (Signed)
History of multiple PCI procedures.  Cardiac catheterization in 2020 demonstrated a chronically occluded RCA and no significant disease in the L CX or LAD.  He is doing well without anginal symptoms.  Continue aspirin 81 mg daily, Lipitor 80 mg daily, carvedilol 25 mg twice daily, Plavix 75 mg daily, Imdur 30 mg daily.

## 2022-02-27 NOTE — Assessment & Plan Note (Signed)
Prior monitor in 2021 demonstrated 6% burden.  He does have some premature beats on exam and had PACs noted on EKG in the hospital.  Obtain follow-up BMET to rule out hypokalemia.  Also obtain magnesium level.  Continue carvedilol 25 mg twice daily.

## 2022-02-27 NOTE — Assessment & Plan Note (Signed)
EF was recently assessed during recent hospitalization with echocardiogram.  Ejection fraction remains stable at 45-50.  Overall, volume status appears to be stable.  His BNP in the hospital was normal.  His weight has decreased since starting on semaglutide.  Lungs are clear on exam.  He does have prior history of smoking as well as asthma.  I suspect his dyspnea is multifactorial.  I encouraged him to increase his activity.  He has albuterol to use as needed.  Continue current dose of torsemide 20 mg twice daily.  Of note, he was taken off of spironolactone in the hospital.  Given his renal insufficiency, I would not try to resume this for now.  He is already on Jardiance 25 mg daily for diabetes.  Obtain follow-up BMET, magnesium today.  Follow-up in 6 months.

## 2022-02-27 NOTE — Patient Instructions (Signed)
Medication Instructions:  Your physician recommends that you continue on your current medications as directed. Please refer to the Current Medication list given to you today.  *If you need a refill on your cardiac medications before your next appointment, please call your pharmacy*   Lab Work: TODAY:  BMET & MAG  If you have labs (blood work) drawn today and your tests are completely normal, you will receive your results only by: Rich (if you have MyChart) OR A paper copy in the mail If you have any lab test that is abnormal or we need to change your treatment, we will call you to review the results.   Testing/Procedures: None ordered   Follow-Up: At [facility], you and your health needs are our priority.  As part of our continuing mission to provide you with exceptional heart care, we have created designated Provider Care Teams.  These Care Teams include your primary Cardiologist (physician) and Advanced Practice Providers (APPs -  Physician Assistants and Nurse Practitioners) who all work together to provide you with the care you need, when you need it.  We recommend signing up for the patient portal called "MyChart".  Sign up information is provided on this After Visit Summary.  MyChart is used to connect with patients for Virtual Visits (Telemedicine).  Patients are able to view lab/test results, encounter notes, upcoming appointments, etc.  Non-urgent messages can be sent to your provider as well.   To learn more about what you can do with MyChart, go to [email or web address].    Your next appointment:   6 month(s)  The format for your next appointment:   In Person  Provider:   Sherren Mocha, MD  or [name], PA-C         Other Instructions   Important Information About Sugar

## 2022-02-27 NOTE — Assessment & Plan Note (Signed)
Creatinine was increased on admission during recent hospitalization for hypotension.  Creatinine was improved prior to discharge.  Obtain follow-up BMET today.

## 2022-02-28 ENCOUNTER — Other Ambulatory Visit: Payer: Self-pay | Admitting: Physician Assistant

## 2022-02-28 ENCOUNTER — Telehealth: Payer: Self-pay | Admitting: *Deleted

## 2022-02-28 NOTE — Telephone Encounter (Signed)
-----   Message from Liliane Shi, [place] sent at 02/27/2022 12:21 PM EDT ----- Gae Bon Can you reach out to him? He has OSA. He could not tolerate the mask. He has not worn one in 3 years. I didn't know if he should be retested again or just come in and see Dr. Radford Pax. He has an android phone. Thanks [employer]

## 2022-02-28 NOTE — Telephone Encounter (Signed)
Message sent to Dr Turner for advisement. 

## 2022-03-01 ENCOUNTER — Telehealth: Payer: Self-pay | Admitting: *Deleted

## 2022-03-01 ENCOUNTER — Telehealth: Payer: Self-pay | Admitting: Cardiovascular Disease

## 2022-03-01 DIAGNOSIS — I2510 Atherosclerotic heart disease of native coronary artery without angina pectoris: Secondary | ICD-10-CM

## 2022-03-01 DIAGNOSIS — G4733 Obstructive sleep apnea (adult) (pediatric): Secondary | ICD-10-CM

## 2022-03-01 DIAGNOSIS — Z79899 Other long term (current) drug therapy: Secondary | ICD-10-CM

## 2022-03-01 DIAGNOSIS — I10 Essential (primary) hypertension: Secondary | ICD-10-CM

## 2022-03-01 MED ORDER — POTASSIUM CHLORIDE ER 10 MEQ PO TBCR: 30.0000 meq | EXTENDED_RELEASE_TABLET | Freq: Two times a day (BID) | ORAL | 3 refills | Status: DC

## 2022-03-01 NOTE — Telephone Encounter (Signed)
-----   Message from Sueanne Margarita, MD sent at 03/01/2022  9:51 AM EDT ----- Arloa Koh try to get a split night sleep study ----- Message ----- From: Freada Bergeron, CMA Sent: 02/28/2022   5:12 PM EDT To: Sueanne Margarita, MD  Patient states he keeps pulling the mask off in his sleep. He was given another kind of mask but he pulled it off in his sleep too.  See below,  Please advise    ----- Message ----- From: Liliane Shi, PA-C Sent: 02/27/2022  12:22 PM EDT To: Freada Bergeron, CMA  Gae Bon Can you reach out to him? He has OSA. He could not tolerate the mask. He has not worn one in 3 years. I didn't know if he should be retested again or just come in and see Dr. Radford Pax. He has an android phone. Thanks [employer]

## 2022-03-01 NOTE — Telephone Encounter (Signed)
Split night study ordered 

## 2022-03-01 NOTE — Telephone Encounter (Signed)
-----   Message from Liliane Shi, PA-C sent at 02/27/2022  5:17 PM EDT ----- Mg2+, K+ low normal. Creatinine improved/stable.  Now that he is off the Spironolactone, I want to increase his K+ supplement a little to keep his K+ closer to 4.  Med list has that he takes K+ 20 mEq twice daily  PLAN:  -Increase K+ to 30 mEq twice daily -BMET 1 week.  [name] Dopp, PA-C    02/27/2022 5:14 PM

## 2022-03-01 NOTE — Telephone Encounter (Signed)
Patient is returning call to discuss lab results. 

## 2022-03-09 ENCOUNTER — Encounter: Payer: Self-pay | Admitting: Podiatry

## 2022-03-09 ENCOUNTER — Ambulatory Visit: Payer: Medicare Other | Admitting: Podiatry

## 2022-03-09 ENCOUNTER — Ambulatory Visit (INDEPENDENT_AMBULATORY_CARE_PROVIDER_SITE_OTHER): Payer: Medicare Other | Admitting: Podiatry

## 2022-03-09 ENCOUNTER — Ambulatory Visit: Payer: Medicare Other | Attending: Cardiology

## 2022-03-09 DIAGNOSIS — N1831 Chronic kidney disease, stage 3a: Secondary | ICD-10-CM | POA: Diagnosis not present

## 2022-03-09 DIAGNOSIS — E1159 Type 2 diabetes mellitus with other circulatory complications: Secondary | ICD-10-CM | POA: Diagnosis not present

## 2022-03-09 DIAGNOSIS — M79674 Pain in right toe(s): Secondary | ICD-10-CM | POA: Diagnosis not present

## 2022-03-09 DIAGNOSIS — L989 Disorder of the skin and subcutaneous tissue, unspecified: Secondary | ICD-10-CM

## 2022-03-09 DIAGNOSIS — B351 Tinea unguium: Secondary | ICD-10-CM

## 2022-03-09 DIAGNOSIS — Z79899 Other long term (current) drug therapy: Secondary | ICD-10-CM

## 2022-03-09 DIAGNOSIS — M79675 Pain in left toe(s): Secondary | ICD-10-CM

## 2022-03-09 NOTE — Progress Notes (Signed)
This patient returns to my office for at risk foot care.  This patient requires this care by a professional since this patient will be at risk due to having pre-ulcerative calluses, CKD and diabetic neuropathy and coagulation defect.  Patient is taking plavix.  This patient is unable to cut nails himself since the patient cannot reach his nails.These nails are painful walking and wearing shoes.  This patient presents for at risk foot care today.    General Appearance  Alert, conversant and in no acute stress.  Vascular  Dorsalis pedis and posterior tibial  pulses are  weakly  palpable  bilaterally.  Capillary return is within normal limits  bilaterally. Temperature is within normal limits  bilaterally.  Neurologic  Senn-Weinstein monofilament wire test diminished   bilaterally. Muscle power within normal limits bilaterally.  Nails Thick disfigured discolored nails with subungual debris  from hallux to fifth toes bilaterally. No evidence of bacterial infection or drainage bilaterally.  Orthopedic  No limitations of motion  feet .  No crepitus or effusions noted.  No bony pathology or digital deformities noted.  Plantar flexed second metatarsal  B/L.  Skin  normotropic skin  noted bilaterally.  No signs of infections or ulcers noted.   Pre-ulcerous callus sub 2  B/L asymptomatic,  Onychomycosis  Pain in right toes  Pain in left toes  Porokeratosis/Pre-ulcerous callus  B/L.  Consent was obtained for treatment procedures.   Mechanical debridement of nails 1-5  bilaterally performed with a nail nipper.  Filed with dremel without incident.     Return office visit     3 months                Told patient to return for periodic foot care and evaluation due to potential at risk complications.   Gardiner Barefoot DPM

## 2022-03-10 LAB — BASIC METABOLIC PANEL
BUN/Creatinine Ratio: 8 — ABNORMAL LOW (ref 10–24)
BUN: 15 mg/dL (ref 8–27)
CO2: 25 mmol/L (ref 20–29)
Calcium: 10 mg/dL (ref 8.6–10.2)
Chloride: 104 mmol/L (ref 96–106)
Creatinine, Ser: 1.78 mg/dL — ABNORMAL HIGH (ref 0.76–1.27)
Glucose: 90 mg/dL (ref 70–99)
Potassium: 4.3 mmol/L (ref 3.5–5.2)
Sodium: 146 mmol/L — ABNORMAL HIGH (ref 134–144)
eGFR: 42 mL/min/{1.73_m2} — ABNORMAL LOW (ref >59)

## 2022-03-12 NOTE — Progress Notes (Signed)
Pt has been made aware of normal result and verbalized understanding.  jw

## 2022-03-29 DIAGNOSIS — H401113 Primary open-angle glaucoma, right eye, severe stage: Secondary | ICD-10-CM | POA: Diagnosis not present

## 2022-03-31 ENCOUNTER — Other Ambulatory Visit: Payer: Self-pay | Admitting: Internal Medicine

## 2022-04-18 ENCOUNTER — Telehealth: Payer: Medicare Other

## 2022-04-22 ENCOUNTER — Other Ambulatory Visit: Payer: Self-pay | Admitting: Cardiovascular Disease

## 2022-06-01 ENCOUNTER — Ambulatory Visit: Payer: Medicare Other | Admitting: Podiatry

## 2022-06-18 ENCOUNTER — Ambulatory Visit: Payer: Medicare Other | Admitting: Internal Medicine

## 2022-06-20 ENCOUNTER — Ambulatory Visit: Payer: Medicare Other | Admitting: Podiatry

## 2022-07-24 ENCOUNTER — Other Ambulatory Visit: Payer: Self-pay | Admitting: Cardiovascular Disease

## 2022-07-27 ENCOUNTER — Ambulatory Visit (INDEPENDENT_AMBULATORY_CARE_PROVIDER_SITE_OTHER): Payer: 59 | Admitting: Internal Medicine

## 2022-07-27 VITALS — BP 134/82 | HR 58 | Temp 98.1°F | Ht 73.0 in | Wt 321.0 lb

## 2022-07-27 DIAGNOSIS — H6122 Impacted cerumen, left ear: Secondary | ICD-10-CM

## 2022-07-27 DIAGNOSIS — Z1211 Encounter for screening for malignant neoplasm of colon: Secondary | ICD-10-CM

## 2022-07-27 DIAGNOSIS — E559 Vitamin D deficiency, unspecified: Secondary | ICD-10-CM | POA: Diagnosis not present

## 2022-07-27 DIAGNOSIS — N1831 Chronic kidney disease, stage 3a: Secondary | ICD-10-CM | POA: Diagnosis not present

## 2022-07-27 DIAGNOSIS — Z125 Encounter for screening for malignant neoplasm of prostate: Secondary | ICD-10-CM | POA: Diagnosis not present

## 2022-07-27 DIAGNOSIS — E538 Deficiency of other specified B group vitamins: Secondary | ICD-10-CM

## 2022-07-27 DIAGNOSIS — Z23 Encounter for immunization: Secondary | ICD-10-CM

## 2022-07-27 DIAGNOSIS — G4733 Obstructive sleep apnea (adult) (pediatric): Secondary | ICD-10-CM | POA: Diagnosis not present

## 2022-07-27 DIAGNOSIS — Z0001 Encounter for general adult medical examination with abnormal findings: Secondary | ICD-10-CM | POA: Diagnosis not present

## 2022-07-27 DIAGNOSIS — E1159 Type 2 diabetes mellitus with other circulatory complications: Secondary | ICD-10-CM

## 2022-07-27 DIAGNOSIS — I10 Essential (primary) hypertension: Secondary | ICD-10-CM | POA: Diagnosis not present

## 2022-07-27 DIAGNOSIS — M5416 Radiculopathy, lumbar region: Secondary | ICD-10-CM

## 2022-07-27 LAB — URINALYSIS, ROUTINE W REFLEX MICROSCOPIC
Bilirubin Urine: NEGATIVE
Hgb urine dipstick: NEGATIVE
Ketones, ur: NEGATIVE
Leukocytes,Ua: NEGATIVE
Nitrite: NEGATIVE
Specific Gravity, Urine: 1.01 (ref 1.000–1.030)
Total Protein, Urine: NEGATIVE
Urine Glucose: >=1000 — AB
Urobilinogen, UA: 0.2 (ref 0.0–1.0)
pH: 6 (ref 5.0–8.0)

## 2022-07-27 LAB — HEPATIC FUNCTION PANEL
ALT: 13 U/L (ref 0–53)
AST: 14 U/L (ref 0–37)
Albumin: 4.2 g/dL (ref 3.5–5.2)
Alkaline Phosphatase: 81 U/L (ref 39–117)
Bilirubin, Direct: 0.1 mg/dL (ref 0.0–0.3)
Total Bilirubin: 0.6 mg/dL (ref 0.2–1.2)
Total Protein: 7.3 g/dL (ref 6.0–8.3)

## 2022-07-27 LAB — BASIC METABOLIC PANEL
BUN: 24 mg/dL — ABNORMAL HIGH (ref 6–23)
CO2: 30 mEq/L (ref 19–32)
Calcium: 9.9 mg/dL (ref 8.4–10.5)
Chloride: 105 mEq/L (ref 96–112)
Creatinine, Ser: 1.62 mg/dL — ABNORMAL HIGH (ref 0.40–1.50)
GFR: 44.29 mL/min — ABNORMAL LOW (ref >60.00)
Glucose, Bld: 112 mg/dL — ABNORMAL HIGH (ref 70–99)
Potassium: 4.1 mEq/L (ref 3.5–5.1)
Sodium: 144 mEq/L (ref 135–145)

## 2022-07-27 LAB — LIPID PANEL
Cholesterol: 218 mg/dL — ABNORMAL HIGH (ref 0–200)
HDL: 48.5 mg/dL (ref >39.00)
LDL Cholesterol: 156 mg/dL — ABNORMAL HIGH (ref 0–99)
NonHDL: 169.36
Total CHOL/HDL Ratio: 4
Triglycerides: 67 mg/dL (ref 0.0–149.0)
VLDL: 13.4 mg/dL (ref 0.0–40.0)

## 2022-07-27 LAB — PSA: PSA: 1.4 ng/mL (ref 0.10–4.00)

## 2022-07-27 LAB — CBC WITH DIFFERENTIAL/PLATELET
Basophils Absolute: 0 10*3/uL (ref 0.0–0.1)
Basophils Relative: 0.3 % (ref 0.0–3.0)
Eosinophils Absolute: 0.2 10*3/uL (ref 0.0–0.7)
Eosinophils Relative: 3.3 % (ref 0.0–5.0)
HCT: 49.5 % (ref 39.0–52.0)
Hemoglobin: 16.3 g/dL (ref 13.0–17.0)
Lymphocytes Relative: 54.3 % — ABNORMAL HIGH (ref 12.0–46.0)
Lymphs Abs: 3.1 10*3/uL (ref 0.7–4.0)
MCHC: 33 g/dL (ref 30.0–36.0)
MCV: 89.3 fl (ref 78.0–100.0)
Monocytes Absolute: 0.6 10*3/uL (ref 0.1–1.0)
Monocytes Relative: 9.7 % (ref 3.0–12.0)
Neutro Abs: 1.9 10*3/uL (ref 1.4–7.7)
Neutrophils Relative %: 32.4 % — ABNORMAL LOW (ref 43.0–77.0)
Platelets: 200 10*3/uL (ref 150.0–400.0)
RBC: 5.55 Mil/uL (ref 4.22–5.81)
RDW: 15.6 % — ABNORMAL HIGH (ref 11.5–15.5)
WBC: 5.8 10*3/uL (ref 4.0–10.5)

## 2022-07-27 LAB — VITAMIN D 25 HYDROXY (VIT D DEFICIENCY, FRACTURES): VITD: 23.87 ng/mL — ABNORMAL LOW (ref 30.00–100.00)

## 2022-07-27 LAB — HEMOGLOBIN A1C: Hgb A1c MFr Bld: 7.3 % — ABNORMAL HIGH (ref 4.6–6.5)

## 2022-07-27 LAB — VITAMIN B12: Vitamin B-12: 263 pg/mL (ref 211–911)

## 2022-07-27 LAB — MICROALBUMIN / CREATININE URINE RATIO
Creatinine,U: 31.7 mg/dL
Microalb Creat Ratio: 9.3 mg/g (ref 0.0–30.0)
Microalb, Ur: 2.9 mg/dL — ABNORMAL HIGH (ref 0.0–1.9)

## 2022-07-27 LAB — TSH: TSH: 1.7 u[IU]/mL (ref 0.35–5.50)

## 2022-07-27 MED ORDER — TIRZEPATIDE 2.5 MG/0.5ML ~~LOC~~ SOAJ: 2.5000 mg | SUBCUTANEOUS | 11 refills | Status: DC

## 2022-07-27 MED ORDER — TRUE METRIX LEVEL 1 LOW VI SOLN: 0 refills | Status: AC

## 2022-07-27 MED ORDER — LANCETS MISC: 3 refills | Status: DC

## 2022-07-27 MED ORDER — TRUE METRIX BLOOD GLUCOSE TEST VI STRP: ORAL_STRIP | 12 refills | Status: DC

## 2022-07-27 MED ORDER — ISOSORBIDE MONONITRATE ER 30 MG PO TB24: 30.0000 mg | ORAL_TABLET | Freq: Every day | ORAL | 3 refills | Status: DC

## 2022-07-27 MED ORDER — GABAPENTIN 300 MG PO CAPS: 300.0000 mg | ORAL_CAPSULE | Freq: Three times a day (TID) | ORAL | 1 refills | Status: DC | PRN

## 2022-07-27 MED ORDER — GNP TRUE METRIX GLUCOSE METER W/DEVICE KIT: PACK | 0 refills | Status: DC

## 2022-07-27 MED ORDER — EMPAGLIFLOZIN 25 MG PO TABS: 25.0000 mg | ORAL_TABLET | Freq: Every day | ORAL | 3 refills | Status: DC

## 2022-07-27 NOTE — Patient Instructions (Addendum)
You had the Pneumovax shot today  Please have the Tdap (tetanus) and Shingrix done at the pharmacy  Ok to change the ozempic to the mounjaro 2.5 mg, and call in 1 mo for the higher strength if you are doing ok  Please continue all other medications as before, and refills have been done if requested.  Please have the pharmacy call with any other refills you may need.  Please continue your efforts at being more active, low cholesterol diet, and weight control.  You are otherwise up to date with prevention measures today.  Please keep your appointments with your specialists as you may have planned  You will be contacted regarding the referral for: colonoscopy, and pulmonary for the sleep apnea  Please go to the LAB at the blood drawing area for the tests to be done  You will be contacted by phone if any changes need to be made immediately.  Otherwise, you will receive a letter about your results with an explanation, but please check with MyChart first.  Please remember to sign up for MyChart if you have not done so, as this will be important to you in the future with finding out test results, communicating by private email, and scheduling acute appointments online when needed.  Please make an Appointment to return in 6 months, or sooner if needed

## 2022-07-27 NOTE — Progress Notes (Unsigned)
Patient ID: [name] [name], male   DOB: [birth year], 67 y.o.   MRN: [id number]         Chief Complaint:: wellness exam and OSA with fatigue, dm, ht, ckd 3a, htn, lo vit D       HPI:  [name] [name] is a 67 y.o. male here for wellness exam; declines covid booster, but for pneumovax and colonoscopy and for shingrx and tdap at pharmacy, o/w up to date                        Also Does c/o ongoing fatigue, but also has signficant daytime hypersomnolence, hx of OSA not currently tx.  Pt denies chest pain, increased sob or doe, wheezing, orthopnea, PND, increased LE swelling, palpitations, dizziness or syncope.   Pt denies polydipsia, polyuria, or new focal neuro s/s.    Pt denies fever, wt loss, night sweats, loss of appetite, or other constitutional symptoms  Has not been able to get ozempic recently due to backorder.  Needs new meter and supplies;   c/o cerumen impaction left ear, unable to clear himself.   Wt Readings from Last 3 Encounters:  07/27/22 (!) 321 lb (145.6 kg)  02/27/22 (!) 324 lb 12.8 oz (147.3 kg)  02/22/22 (!) 323 lb (146.5 kg)   BP Readings from Last 3 Encounters:  07/27/22 134/82  02/27/22 112/60  02/22/22 128/76   Immunization History  Administered Date(s) Administered   COVID-19, mRNA, vaccine(Comirnaty)12 years and older 04/17/2022   Fluad Quad(high Dose 65+) 02/22/2022   Influenza Whole 04/12/2008, 04/12/2010, 02/10/2011   Influenza, Seasonal, Injecte, Preservative Fre 03/11/2013   Influenza,inj,Quad PF,6+ Mos 02/12/2015, 05/04/2016, 03/29/2017, 05/05/2018, 05/15/2019, 03/22/2020, 07/12/2021   Moderna Sars-Covid-2 Vaccination 11/03/2019, 12/01/2019, 06/20/2020   PFIZER(Purple Top)SARS-COV-2 Vaccination 11/10/2019   Pneumococcal Conjugate-13 06/26/2013   Pneumococcal Polysaccharide-23 07/21/2009, 06/27/2015, 07/27/2022   Td 03/24/2009   Zoster Recombinat (Shingrix) 04/02/2017   Health Maintenance Due  Topic Date Due   DTaP/Tdap/Td (2 - Tdap) 03/25/2019   Medicare  Annual Wellness (AWV)  09/21/2022      Past Medical History:  Diagnosis Date   CAD (coronary artery disease)    a. s/p BMS pRCA 2002; b. DES to pRCA & DES p/m RCA 2006; c. PCI/DES OM4 2007; d. PCI/CBA to RCA for ISR 10/2006; e.08/2012 STEMI/Cath/PCI:  LAD 95% >> PCI: Promus Prem DES  //  f. NSTEMI 10/15 >> [facility]: mLAD stent ok, dLAD 70, OM1 CTO, OM2 stent ok, dOM2 90, OM3 30-40, dLCx 90, p-mRCA stent ok, mRCA stent ok w/ 60-70 ISR, EF 50% >> med Rx // MV 1/17: no ischemia // Cath ([facility]) 05/2019: RCA 100 (CTO)    Combined systolic and diastolic CHF    a. Myoview 1/17: EF 35%  //  b. Echo 1/17 EF 45-50% // Echo 05/2019: EF 50-55 // Echocardiogram 8/21: EF 50, inf AK, post and mid inf HK, mod LVH, RVSP 25.3, mild to mod LAE, trivial MR    Depression    Diabetes mellitus ([place])    a. A1c 8.8 08/2012->Metformin initiated. => b. A1c (9/14): 6.6   Gastroesophageal reflux disease    History of nuclear stress test    a. Myoview 1/17: EF 35%, fixed inferior lateral defect consistent with infarct, no ischemia, intermediate risk   History of pneumonia    History of stroke 01/2021   Zio Monitor 11/22: NSR, avg HR 61; PVCs (1.9%); no AFib, no high grade HB, no ventricular arrhythmias   HTN (hypertension)  Hx of NSTEMI    Hyperlipidemia    Ischemic cardiomyopathy    a. EF 40%; improved to normal;  b. 08/2012 Echo: EF 50-55%, mod LVH.//  c. Echo 9/16: Inf HK, mild LVH, EF 55%, mild LAE, normal RVF, mild RAE, PASP 35 mmHg  //  d. Echo 1/17: EF 45-50%, inferior HK, mild BAE, PASP 33 mmHg   Obesity    OSA (obstructive sleep apnea)    Does not use CPAP as of 05/2011   Tobacco abuse    Past Surgical History:  Procedure Laterality Date   CORONARY ANGIOPLASTY WITH STENT PLACEMENT     CORONARY STENT PLACEMENT  2009   LEFT HEART CATHETERIZATION WITH CORONARY ANGIOGRAM N/A 08/31/2012   Procedure: LEFT HEART CATHETERIZATION WITH CORONARY ANGIOGRAM;  Surgeon: Burnell Blanks, MD;  Location: [facility] CATH LAB;   Service: Cardiovascular;  Laterality: N/A;   LEFT HEART CATHETERIZATION WITH CORONARY ANGIOGRAM N/A 11/16/2013   Procedure: LEFT HEART CATHETERIZATION WITH CORONARY ANGIOGRAM;  Surgeon: Blane Ohara, MD;  Location: [facility] CATH LAB;  Service: Cardiovascular;  Laterality: N/A;   LEFT HEART CATHETERIZATION WITH CORONARY ANGIOGRAM N/A 03/15/2014   Procedure: LEFT HEART CATHETERIZATION WITH CORONARY ANGIOGRAM;  Surgeon: Peter M Martinique, MD;  Location: [facility] CATH LAB;  Service: Cardiovascular;  Laterality: N/A;   PERCUTANEOUS CORONARY STENT INTERVENTION (PCI-S) Right 08/31/2012   Procedure: PERCUTANEOUS CORONARY STENT INTERVENTION (PCI-S);  Surgeon: Burnell Blanks, MD;  Location: [facility] CATH LAB;  Service: Cardiovascular;  Laterality: Right;   PERCUTANEOUS CORONARY STENT INTERVENTION (PCI-S)  11/16/2013   Procedure: PERCUTANEOUS CORONARY STENT INTERVENTION (PCI-S);  Surgeon: Blane Ohara, MD;  Location: [facility] CATH LAB;  Service: Cardiovascular;;    reports that he has quit smoking. His smoking use included cigarettes. He has a 15.00 pack-year smoking history. He has never used smokeless tobacco. He reports that he does not drink alcohol and does not use drugs. family history includes Cancer in his mother; Coronary artery disease in an other family member; Depression in his mother; Hypertension in his mother; Other in his father. Allergies  Allergen Reactions   Penicillin G Other (See Comments)    Did it involve swelling of the face/tongue/throat, SOB, or low BP?  N/A Did it involve sudden or severe rash/hives, skin peeling, or any reaction on the inside of your mouth or nose? N/A Did you need to seek medical attention at a hospital or doctor's office? N/A When did it last happen? Child If all above answers are "NO", may proceed with cephalosporin use.   Penicillins Other (See Comments)    Did it involve swelling of the face/tongue/throat, SOB, or low BP? N/A Did it involve sudden or severe rash/hives,  skin peeling, or any reaction on the inside of your mouth or nose?N/A Did you need to seek medical attention at a hospital or doctor's office? N/A When did it last happen? Child      If all above answers are "NO", may proceed with cephalosporin use.   Current Outpatient Medications on File Prior to Visit  Medication Sig Dispense Refill   albuterol (VENTOLIN HFA) 108 (90 Base) MCG/ACT inhaler Inhale 2 puffs into the lungs every 6 (six) hours as needed for wheezing or shortness of breath. 8 g 11   Alcohol Swabs (B-D SINGLE USE SWABS BUTTERFLY) PADS Use up to 4 times a day to test blood sugars. Dx: E10.9, E11.9 100 each 11   atorvastatin (LIPITOR) 80 MG tablet TAKE 1 TABLET(80 MG) BY MOUTH AT BEDTIME 90  tablet 3   carvedilol (COREG) 25 MG tablet TAKE 1 TABLET(25 MG) BY MOUTH TWICE DAILY WITH A MEAL 180 tablet 3   clopidogrel (PLAVIX) 75 MG tablet TAKE 1 TABLET(75 MG) BY MOUTH DAILY 90 tablet 3   dorzolamide-timolol (COSOPT) 2-0.5 % ophthalmic solution SMARTSIG:In Eye(s)     nitroGLYCERIN (NITROSTAT) 0.4 MG SL tablet PLACE 1 TABLET UNDER THE TONGUE EVERY 5 MINUTES AS NEEDED FOR CHEST PAIN 25 tablet 5   pantoprazole (PROTONIX) 40 MG tablet Take 1 tablet (40 mg total) by mouth daily. 90 tablet 3   PARoxetine (PAXIL) 20 MG tablet TAKE 1 TABLET(20 MG) BY MOUTH DAILY 90 tablet 3   potassium chloride (KLOR-CON) 10 MEQ tablet Take 3 tablets (30 mEq total) by mouth 2 (two) times daily. 540 tablet 3   torsemide (DEMADEX) 20 MG tablet TAKE 2 TABLETS BY MOUTH IN THE A.M, TAKE 1 TABLET BY MOUTH IN THE P.M. X'S 3 DAYS THEN REDUCE IT TO TAKING 1 TABLET BY MOUTH TWICE A DAY 180 tablet 3   triamcinolone cream (KENALOG) 0.1 % Apply 1 Application topically 2 (two) times daily. 30 g 0   vitamin B-12 (CYANOCOBALAMIN) 1000 MCG tablet Take 1 tablet (1,000 mcg total) by mouth daily. 90 tablet 3   aspirin 81 MG EC tablet Take 81 mg by mouth daily. (Patient not taking: Reported on 07/27/2022)     traMADol (ULTRAM) 50 MG  tablet 1 tab by mouth four times per day as needed (Patient not taking: Reported on 07/27/2022) 120 tablet 2   No current facility-administered medications on file prior to visit.        ROS:  All others reviewed and negative.  Objective        PE:  BP 134/82 (BP Location: Left Arm, Patient Position: Sitting, Cuff Size: Large)   Pulse (!) 58   Temp 98.1 F (36.7 C) (Oral)   Ht 6' 1"$  (1.854 m)   Wt (!) 321 lb (145.6 kg)   SpO2 97%   BMI 42.35 kg/m                 Constitutional: Pt appears in NAD               HENT: Head: NCAT.                Right Ear: External ear normal.                 Left Ear: External ear normal. Left canal clear after irrigation wax impaction               Eyes: . Pupils are equal, round, and reactive to light. Conjunctivae and EOM are normal               Nose: without d/c or deformity               Neck: Neck supple. Gross normal ROM               Cardiovascular: Normal rate and regular rhythm.                 Pulmonary/Chest: Effort normal and breath sounds without rales or wheezing.                Abd:  Soft, NT, ND, + BS, no organomegaly               Neurological: Pt is alert. At baseline orientation, motor grossly intact  Skin: Skin is warm. No rashes, no other new lesions, LE edema - trace bialteral               Psychiatric: Pt behavior is normal without agitation   Micro: none  Cardiac tracings I have personally interpreted today:  none  Pertinent Radiological findings (summarize): none   Lab Results  Component Value Date   WBC 5.8 07/27/2022   HGB 16.3 07/27/2022   HCT 49.5 07/27/2022   PLT 200.0 07/27/2022   GLUCOSE 112 (H) 07/27/2022   CHOL 218 (H) 07/27/2022   TRIG 67.0 07/27/2022   HDL 48.50 07/27/2022   LDLCALC 156 (H) 07/27/2022   ALT 13 07/27/2022   AST 14 07/27/2022   NA 144 07/27/2022   K 4.1 07/27/2022   CL 105 07/27/2022   CREATININE 1.62 (H) 07/27/2022   BUN 24 (H) 07/27/2022   CO2 30 07/27/2022   TSH  1.70 07/27/2022   PSA 1.40 07/27/2022   INR 1.0 02/05/2021   HGBA1C 7.3 (H) 07/27/2022   MICROALBUR 2.9 (H) 07/27/2022   Assessment/Plan:  [name] [name] is a 67 y.o. Black or African American [2] male with  has a past medical history of CAD (coronary artery disease), Combined systolic and diastolic CHF, Depression, Diabetes mellitus ([place]), Gastroesophageal reflux disease, History of nuclear stress test, History of pneumonia, History of stroke (01/2021), HTN (hypertension), Hx of NSTEMI, Hyperlipidemia, Ischemic cardiomyopathy, Obesity, OSA (obstructive sleep apnea), and Tobacco abuse.  Encounter for well adult exam with abnormal findings Age and sex appropriate education and counseling updated with regular exercise and diet Referrals for preventative services - for colonoscopy Immunizations addressed - for pneumovax, and for tdap and shignrix at pharmacy Smoking counseling  - none needed Evidence for depression or other mood disorder - none significant Most recent labs reviewed. I have personally reviewed and have noted: 1) the patient's medical and social history 2) The patient's current medications and supplements 3) The patient's height, weight, and BMI have been recorded in the chart   B12 deficiency Lab Results  Component Value Date   VITAMINB12 263 07/27/2022   Low, to start oral replacement - b12 1000 mcg qd   CKD (chronic kidney disease), stage III ([place]) Lab Results  Component Value Date   CREATININE 1.62 (H) 07/27/2022   Stable overall, cont to avoid nephrotoxins   DM2 (diabetes mellitus, type 2) ([place]) Lab Results  Component Value Date   HGBA1C 7.3 (H) 07/27/2022   uncontrolled, goal A1c < 7,  pt to continue current medical treatment jardianc 25 qd, but change ozempic to mounjaro due to backorder status   Essential hypertension BP Readings from Last 3 Encounters:  07/27/22 134/82  02/27/22 112/60  02/22/22 128/76   Stable, pt to continue medical treatment  coreg 25 bid   OSA (obstructive sleep apnea) Not currently tx, for pulm referral as symptomatic and may need repeat sleep testing  Vitamin D deficiency Last vitamin D Lab Results  Component Value Date   VD25OH 23.87 (L) 07/27/2022   Low, to start oral replacement   Hearing loss of left ear due to cerumen impaction Ceruminosis is noted.  Wax is removed by syringing and manual debridement. Instructions for home care to prevent wax buildup are given.  Followup: Return in about 6 months (around 01/25/2023).  Cathlean Cower, MD 07/29/2022 7:29 AM [facility]

## 2022-07-28 ENCOUNTER — Other Ambulatory Visit: Payer: Self-pay | Admitting: Internal Medicine

## 2022-07-29 ENCOUNTER — Encounter: Payer: Self-pay | Admitting: Internal Medicine

## 2022-07-29 NOTE — Assessment & Plan Note (Signed)
BP Readings from Last 3 Encounters:  07/27/22 134/82  02/27/22 112/60  02/22/22 128/76   Stable, pt to continue medical treatment coreg 25 bid

## 2022-07-29 NOTE — Assessment & Plan Note (Signed)
Lab Results  Component Value Date   CREATININE 1.62 (H) 07/27/2022   Stable overall, cont to avoid nephrotoxins

## 2022-07-29 NOTE — Assessment & Plan Note (Signed)
Lab Results  Component Value Date   VITAMINB12 263 07/27/2022   Low, to start oral replacement - b12 1000 mcg qd

## 2022-07-29 NOTE — Assessment & Plan Note (Signed)
Not currently tx, for pulm referral as symptomatic and may need repeat sleep testing

## 2022-07-29 NOTE — Assessment & Plan Note (Signed)
Last vitamin D Lab Results  Component Value Date   VD25OH 23.87 (L) 07/27/2022   Low, to start oral replacement

## 2022-07-29 NOTE — Assessment & Plan Note (Signed)
Age and sex appropriate education and counseling updated with regular exercise and diet Referrals for preventative services - for colonoscopy Immunizations addressed - for pneumovax, and for tdap and shignrix at pharmacy Smoking counseling  - none needed Evidence for depression or other mood disorder - none significant Most recent labs reviewed. I have personally reviewed and have noted: 1) the patient's medical and social history 2) The patient's current medications and supplements 3) The patient's height, weight, and BMI have been recorded in the chart

## 2022-07-29 NOTE — Assessment & Plan Note (Signed)
Ceruminosis is noted.  Wax is removed by syringing and manual debridement. Instructions for home care to prevent wax buildup are given.  

## 2022-07-29 NOTE — Assessment & Plan Note (Signed)
Lab Results  Component Value Date   HGBA1C 7.3 (H) 07/27/2022   uncontrolled, goal A1c < 7,  pt to continue current medical treatment jardianc 25 qd, but change ozempic to mounjaro due to backorder status

## 2022-08-06 ENCOUNTER — Ambulatory Visit (INDEPENDENT_AMBULATORY_CARE_PROVIDER_SITE_OTHER): Payer: 59 | Admitting: Podiatry

## 2022-08-06 ENCOUNTER — Encounter: Payer: Self-pay | Admitting: Podiatry

## 2022-08-06 VITALS — BP 150/92 | HR 52

## 2022-08-06 DIAGNOSIS — M79675 Pain in left toe(s): Secondary | ICD-10-CM | POA: Diagnosis not present

## 2022-08-06 DIAGNOSIS — B351 Tinea unguium: Secondary | ICD-10-CM

## 2022-08-06 DIAGNOSIS — E1159 Type 2 diabetes mellitus with other circulatory complications: Secondary | ICD-10-CM

## 2022-08-06 DIAGNOSIS — L84 Corns and callosities: Secondary | ICD-10-CM | POA: Diagnosis not present

## 2022-08-06 DIAGNOSIS — M79674 Pain in right toe(s): Secondary | ICD-10-CM | POA: Diagnosis not present

## 2022-08-06 NOTE — Progress Notes (Signed)
This patient returns to my office for at risk foot care.  This patient requires this care by a professional since this patient will be at risk due to having pre-ulcerative calluses, CKD and diabetic neuropathy and coagulation defect.  Patient is taking plavix.  This patient is unable to cut nails himself since the patient cannot reach his nails.These nails are painful walking and wearing shoes.  This patient presents for at risk foot care today.    General Appearance  Alert, conversant and in no acute stress.  Vascular  Dorsalis pedis and posterior tibial  pulses are  weakly  palpable  bilaterally.  Capillary return is within normal limits  bilaterally. Temperature is within normal limits  bilaterally.  Neurologic  Senn-Weinstein monofilament wire test diminished   bilaterally. Muscle power within normal limits bilaterally.  Nails Thick disfigured discolored nails with subungual debris  from hallux to fifth toes bilaterally. No evidence of bacterial infection or drainage bilaterally.  Orthopedic  No limitations of motion  feet .  No crepitus or effusions noted.  No bony pathology or digital deformities noted.  Plantar flexed second metatarsal  B/L.  Skin  normotropic skin  noted bilaterally.  No signs of infections or ulcers noted.   Pre-ulcerous callus sub 2  B/L asymptomatic,  Onychomycosis  Pain in right toes  Pain in left toes  Porokeratosis/Pre-ulcerous callus  B/L.  Consent was obtained for treatment procedures.   Mechanical debridement of nails 1-5  bilaterally performed with a nail nipper.  Filed with dremel without incident.  Debride pre-ulcerous callus with # 15 blade.   Return office visit     3 months                Told patient to return for periodic foot care and evaluation due to potential at risk complications.   Gardiner Barefoot DPM

## 2022-08-08 ENCOUNTER — Telehealth: Payer: Self-pay | Admitting: Internal Medicine

## 2022-08-08 NOTE — Telephone Encounter (Signed)
Patient will contact insurance to find out what supplies they will cover and let us know

## 2022-08-08 NOTE — Telephone Encounter (Signed)
Patient said his insurance would not cover any of his glucose monitoring supplies. He would like to know what Dr. [name] would advise. Best callback number is [phone].

## 2022-08-10 MED ORDER — ONETOUCH VERIO VI STRP: ORAL_STRIP | 12 refills | Status: DC

## 2022-08-10 MED ORDER — LANCETS MISC: 3 refills | Status: AC

## 2022-08-10 MED ORDER — ONETOUCH VERIO FLEX SYSTEM DEVI: 1.0000 | Freq: Four times a day (QID) | 0 refills | Status: AC

## 2022-08-10 NOTE — Telephone Encounter (Signed)
Ok I have sent this

## 2022-08-10 NOTE — Telephone Encounter (Signed)
Pt has stated that his insurance will cover the Onetouch Flex glucose monitor.

## 2022-08-11 ENCOUNTER — Emergency Department (HOSPITAL_COMMUNITY): Payer: 59

## 2022-08-11 ENCOUNTER — Other Ambulatory Visit: Payer: Self-pay

## 2022-08-11 ENCOUNTER — Inpatient Hospital Stay (HOSPITAL_COMMUNITY)
Admission: EM | Admit: 2022-08-11 | Discharge: 2022-08-18 | DRG: 277 | Disposition: A | Discharge status: Home / Self Care | Payer: 59 | Attending: Cardiovascular Disease | Admitting: Cardiovascular Disease

## 2022-08-11 DIAGNOSIS — R001 Bradycardia, unspecified: Secondary | ICD-10-CM | POA: Diagnosis present

## 2022-08-11 DIAGNOSIS — I214 Non-ST elevation (NSTEMI) myocardial infarction: Secondary | ICD-10-CM | POA: Diagnosis not present

## 2022-08-11 DIAGNOSIS — I255 Ischemic cardiomyopathy: Secondary | ICD-10-CM | POA: Diagnosis present

## 2022-08-11 DIAGNOSIS — E1122 Type 2 diabetes mellitus with diabetic chronic kidney disease: Secondary | ICD-10-CM | POA: Diagnosis present

## 2022-08-11 DIAGNOSIS — E1140 Type 2 diabetes mellitus with diabetic neuropathy, unspecified: Secondary | ICD-10-CM | POA: Diagnosis not present

## 2022-08-11 DIAGNOSIS — Z8249 Family history of ischemic heart disease and other diseases of the circulatory system: Secondary | ICD-10-CM

## 2022-08-11 DIAGNOSIS — N179 Acute kidney failure, unspecified: Secondary | ICD-10-CM | POA: Diagnosis not present

## 2022-08-11 DIAGNOSIS — Z7982 Long term (current) use of aspirin: Secondary | ICD-10-CM

## 2022-08-11 DIAGNOSIS — Z6841 Body Mass Index (BMI) 40.0 and over, adult: Secondary | ICD-10-CM

## 2022-08-11 DIAGNOSIS — Z713 Dietary counseling and surveillance: Secondary | ICD-10-CM

## 2022-08-11 DIAGNOSIS — Z79899 Other long term (current) drug therapy: Secondary | ICD-10-CM | POA: Diagnosis not present

## 2022-08-11 DIAGNOSIS — Z7902 Long term (current) use of antithrombotics/antiplatelets: Secondary | ICD-10-CM

## 2022-08-11 DIAGNOSIS — N1832 Chronic kidney disease, stage 3b: Secondary | ICD-10-CM | POA: Diagnosis present

## 2022-08-11 DIAGNOSIS — E7800 Pure hypercholesterolemia, unspecified: Secondary | ICD-10-CM | POA: Diagnosis present

## 2022-08-11 DIAGNOSIS — F32A Depression, unspecified: Secondary | ICD-10-CM | POA: Diagnosis not present

## 2022-08-11 DIAGNOSIS — E785 Hyperlipidemia, unspecified: Secondary | ICD-10-CM | POA: Diagnosis not present

## 2022-08-11 DIAGNOSIS — K219 Gastro-esophageal reflux disease without esophagitis: Secondary | ICD-10-CM | POA: Diagnosis present

## 2022-08-11 DIAGNOSIS — R000 Tachycardia, unspecified: Secondary | ICD-10-CM | POA: Diagnosis not present

## 2022-08-11 DIAGNOSIS — I4510 Unspecified right bundle-branch block: Secondary | ICD-10-CM | POA: Diagnosis present

## 2022-08-11 DIAGNOSIS — I509 Heart failure, unspecified: Secondary | ICD-10-CM | POA: Diagnosis not present

## 2022-08-11 DIAGNOSIS — I2510 Atherosclerotic heart disease of native coronary artery without angina pectoris: Secondary | ICD-10-CM | POA: Diagnosis not present

## 2022-08-11 DIAGNOSIS — I2582 Chronic total occlusion of coronary artery: Secondary | ICD-10-CM | POA: Diagnosis not present

## 2022-08-11 DIAGNOSIS — I25110 Atherosclerotic heart disease of native coronary artery with unstable angina pectoris: Secondary | ICD-10-CM | POA: Diagnosis not present

## 2022-08-11 DIAGNOSIS — Z8673 Personal history of transient ischemic attack (TIA), and cerebral infarction without residual deficits: Secondary | ICD-10-CM | POA: Diagnosis not present

## 2022-08-11 DIAGNOSIS — Z7985 Long-term (current) use of injectable non-insulin antidiabetic drugs: Secondary | ICD-10-CM

## 2022-08-11 DIAGNOSIS — G4733 Obstructive sleep apnea (adult) (pediatric): Secondary | ICD-10-CM | POA: Diagnosis not present

## 2022-08-11 DIAGNOSIS — I472 Ventricular tachycardia, unspecified: Principal | ICD-10-CM | POA: Diagnosis present

## 2022-08-11 DIAGNOSIS — Z87891 Personal history of nicotine dependence: Secondary | ICD-10-CM | POA: Diagnosis not present

## 2022-08-11 DIAGNOSIS — I10 Essential (primary) hypertension: Secondary | ICD-10-CM | POA: Diagnosis not present

## 2022-08-11 DIAGNOSIS — E1169 Type 2 diabetes mellitus with other specified complication: Secondary | ICD-10-CM | POA: Diagnosis not present

## 2022-08-11 DIAGNOSIS — R0789 Other chest pain: Secondary | ICD-10-CM | POA: Diagnosis not present

## 2022-08-11 DIAGNOSIS — E118 Type 2 diabetes mellitus with unspecified complications: Secondary | ICD-10-CM | POA: Diagnosis not present

## 2022-08-11 DIAGNOSIS — I4729 Other ventricular tachycardia: Secondary | ICD-10-CM | POA: Diagnosis present

## 2022-08-11 DIAGNOSIS — I499 Cardiac arrhythmia, unspecified: Secondary | ICD-10-CM | POA: Diagnosis not present

## 2022-08-11 DIAGNOSIS — I5022 Chronic systolic (congestive) heart failure: Secondary | ICD-10-CM | POA: Diagnosis present

## 2022-08-11 DIAGNOSIS — J9811 Atelectasis: Secondary | ICD-10-CM | POA: Diagnosis not present

## 2022-08-11 DIAGNOSIS — I130 Hypertensive heart and chronic kidney disease with heart failure and stage 1 through stage 4 chronic kidney disease, or unspecified chronic kidney disease: Secondary | ICD-10-CM | POA: Diagnosis present

## 2022-08-11 DIAGNOSIS — N1831 Chronic kidney disease, stage 3a: Secondary | ICD-10-CM | POA: Diagnosis not present

## 2022-08-11 DIAGNOSIS — Z91199 Patient's noncompliance with other medical treatment and regimen due to unspecified reason: Secondary | ICD-10-CM

## 2022-08-11 DIAGNOSIS — E876 Hypokalemia: Secondary | ICD-10-CM | POA: Diagnosis present

## 2022-08-11 DIAGNOSIS — R079 Chest pain, unspecified: Secondary | ICD-10-CM | POA: Diagnosis not present

## 2022-08-11 DIAGNOSIS — Z880 Allergy status to penicillin: Secondary | ICD-10-CM

## 2022-08-11 LAB — CBC WITH DIFFERENTIAL/PLATELET
Abs Immature Granulocytes: 0.02 10*3/uL (ref 0.00–0.07)
Basophils Absolute: 0 10*3/uL (ref 0.0–0.1)
Basophils Relative: 1 %
Eosinophils Absolute: 0.3 10*3/uL (ref 0.0–0.5)
Eosinophils Relative: 4 %
HCT: 52.9 % — ABNORMAL HIGH (ref 39.0–52.0)
Hemoglobin: 17.2 g/dL — ABNORMAL HIGH (ref 13.0–17.0)
Immature Granulocytes: 0 %
Lymphocytes Relative: 54 %
Lymphs Abs: 4.5 10*3/uL — ABNORMAL HIGH (ref 0.7–4.0)
MCH: 28.8 pg (ref 26.0–34.0)
MCHC: 32.5 g/dL (ref 30.0–36.0)
MCV: 88.6 fL (ref 80.0–100.0)
Monocytes Absolute: 0.8 10*3/uL (ref 0.1–1.0)
Monocytes Relative: 10 %
Neutro Abs: 2.5 10*3/uL (ref 1.7–7.7)
Neutrophils Relative %: 31 %
Platelets: 193 10*3/uL (ref 150–400)
RBC: 5.97 MIL/uL — ABNORMAL HIGH (ref 4.22–5.81)
RDW: 16 % — ABNORMAL HIGH (ref 11.5–15.5)
WBC: 8.1 10*3/uL (ref 4.0–10.5)
nRBC: 0 % (ref 0.0–0.2)

## 2022-08-11 LAB — BASIC METABOLIC PANEL
Anion gap: 14 (ref 5–15)
BUN: 25 mg/dL — ABNORMAL HIGH (ref 8–23)
CO2: 23 mmol/L (ref 22–32)
Calcium: 9.6 mg/dL (ref 8.9–10.3)
Chloride: 102 mmol/L (ref 98–111)
Creatinine, Ser: 1.76 mg/dL — ABNORMAL HIGH (ref 0.61–1.24)
GFR, Estimated: 42 mL/min — ABNORMAL LOW (ref >60)
Glucose, Bld: 147 mg/dL — ABNORMAL HIGH (ref 70–99)
Potassium: 4.1 mmol/L (ref 3.5–5.1)
Sodium: 139 mmol/L (ref 135–145)

## 2022-08-11 LAB — CBG MONITORING, ED: Glucose-Capillary: 177 mg/dL — ABNORMAL HIGH (ref 70–99)

## 2022-08-11 LAB — TROPONIN I (HIGH SENSITIVITY)
Troponin I (High Sensitivity): 51 ng/L — ABNORMAL HIGH (ref <18)
Troponin I (High Sensitivity): 146 ng/L (ref <18)

## 2022-08-11 LAB — MAGNESIUM: Magnesium: 2.2 mg/dL (ref 1.7–2.4)

## 2022-08-11 LAB — HEPATIC FUNCTION PANEL
ALT: 17 U/L (ref 0–44)
AST: 30 U/L (ref 15–41)
Albumin: 3.8 g/dL (ref 3.5–5.0)
Alkaline Phosphatase: 87 U/L (ref 38–126)
Bilirubin, Direct: 0.5 mg/dL — ABNORMAL HIGH (ref 0.0–0.2)
Indirect Bilirubin: 0.8 mg/dL (ref 0.3–0.9)
Total Bilirubin: 1.3 mg/dL — ABNORMAL HIGH (ref 0.3–1.2)
Total Protein: 7.4 g/dL (ref 6.5–8.1)

## 2022-08-11 LAB — LIPASE, BLOOD: Lipase: 34 U/L (ref 11–51)

## 2022-08-11 LAB — GLUCOSE, CAPILLARY
Glucose-Capillary: 123 mg/dL — ABNORMAL HIGH (ref 70–99)
Glucose-Capillary: 145 mg/dL — ABNORMAL HIGH (ref 70–99)

## 2022-08-11 LAB — BRAIN NATRIURETIC PEPTIDE: B Natriuretic Peptide: 95.3 pg/mL (ref 0.0–100.0)

## 2022-08-11 LAB — MRSA NEXT GEN BY PCR, NASAL: MRSA by PCR Next Gen: NOT DETECTED

## 2022-08-11 MED ORDER — HEPARIN BOLUS VIA INFUSION
4000.0000 [IU] | Freq: Once | INTRAVENOUS | Status: AC
  Administered 2022-08-11: 4000 [IU] via INTRAVENOUS
  Filled 2022-08-11: qty 4000

## 2022-08-11 MED ORDER — AMIODARONE HCL IN DEXTROSE 360-4.14 MG/200ML-% IV SOLN
30.0000 mg/h | INTRAVENOUS | Status: DC
  Administered 2022-08-13: 30 mg/h via INTRAVENOUS
  Filled 2022-08-11 (×2): qty 200
  Filled 2022-08-11: qty 2800
  Filled 2022-08-11: qty 200

## 2022-08-11 MED ORDER — ASPIRIN 81 MG PO TBEC
81.0000 mg | DELAYED_RELEASE_TABLET | Freq: Every day | ORAL | Status: DC
  Administered 2022-08-12 – 2022-08-18 (×7): 81 mg via ORAL
  Filled 2022-08-11 (×8): qty 1

## 2022-08-11 MED ORDER — HEPARIN (PORCINE) 25000 UT/250ML-% IV SOLN
1500.0000 [IU]/h | INTRAVENOUS | Status: DC
  Administered 2022-08-11 – 2022-08-13 (×4): 1500 [IU]/h via INTRAVENOUS
  Filled 2022-08-11 (×3): qty 250

## 2022-08-11 MED ORDER — AMIODARONE HCL IN DEXTROSE 360-4.14 MG/200ML-% IV SOLN
60.0000 mg/h | INTRAVENOUS | Status: AC
  Administered 2022-08-11 (×2): 60 mg/h via INTRAVENOUS
  Filled 2022-08-11 (×2): qty 200

## 2022-08-11 MED ORDER — LIDOCAINE HCL (CARDIAC) PF 100 MG/5ML IV SOSY
100.0000 mg | PREFILLED_SYRINGE | Freq: Once | INTRAVENOUS | Status: AC
  Administered 2022-08-11: 100 mg via INTRAVENOUS

## 2022-08-11 MED ORDER — LIDOCAINE IN D5W 4-5 MG/ML-% IV SOLN
1.0000 mg/min | INTRAVENOUS | Status: DC
  Administered 2022-08-11 – 2022-08-13 (×2): 1 mg/min via INTRAVENOUS
  Filled 2022-08-11: qty 500

## 2022-08-11 MED ORDER — NITROGLYCERIN 0.4 MG SL SUBL: 0.4000 mg | SUBLINGUAL_TABLET | SUBLINGUAL | Status: DC | PRN

## 2022-08-11 MED ORDER — ACETAMINOPHEN 325 MG PO TABS: 650.0000 mg | ORAL_TABLET | ORAL | Status: DC | PRN

## 2022-08-11 MED ORDER — ASPIRIN 81 MG PO CHEW: 324.0000 mg | CHEWABLE_TABLET | Freq: Once | ORAL | Status: DC

## 2022-08-11 NOTE — Progress Notes (Signed)
ANTICOAGULATION CONSULT NOTE - Initial Consult  Pharmacy Consult for Heparin Indication: chest pain/ACS  Allergies  Allergen Reactions   Penicillin G Other (See Comments)    Did it involve swelling of the face/tongue/throat, SOB, or low BP?  N/A Did it involve sudden or severe rash/hives, skin peeling, or any reaction on the inside of your mouth or nose? N/A Did you need to seek medical attention at a hospital or doctor's office? N/A When did it last happen? Child If all above answers are "NO", may proceed with cephalosporin use.   Penicillins Other (See Comments)    Did it involve swelling of the face/tongue/throat, SOB, or low BP? N/A Did it involve sudden or severe rash/hives, skin peeling, or any reaction on the inside of your mouth or nose?N/A Did you need to seek medical attention at a hospital or doctor's office? N/A When did it last happen? Child      If all above answers are "NO", may proceed with cephalosporin use.    Patient Measurements: Height: '6\' 1"'$  (185.4 cm) Weight: (!) 140.6 kg (310 lb) IBW/kg (Calculated) : 79.9 Heparin Dosing Weight: 112.1 kg  Vital Signs: Temp: 97.9 F (36.6 C) (03/02 1745) Temp Source: Oral (03/02 1745) BP: 161/118 (03/02 1800) Pulse Rate: 82 (03/02 1815)  Labs: Recent Labs    08/11/22 1755  HGB 17.2*  HCT 52.9*  PLT 193    Estimated Creatinine Clearance: 67 mL/min (A) (by C-G formula based on SCr of 1.62 mg/dL (H)).   Medical History: Past Medical History:  Diagnosis Date   CAD (coronary artery disease)    a. s/p BMS pRCA 2002; b. DES to pRCA & DES p/m RCA 2006; c. PCI/DES OM4 2007; d. PCI/CBA to RCA for ISR 10/2006; e.08/2012 STEMI/Cath/PCI:  LAD 95% >> PCI: Promus Prem DES  //  f. NSTEMI 10/15 >> [facility]: mLAD stent ok, dLAD 70, OM1 CTO, OM2 stent ok, dOM2 90, OM3 30-40, dLCx 90, p-mRCA stent ok, mRCA stent ok w/ 60-70 ISR, EF 50% >> med Rx // MV 1/17: no ischemia // Cath ([facility]) 05/2019: RCA 100 (CTO)    Combined systolic and  diastolic CHF    a. Myoview 1/17: EF 35%  //  b. Echo 1/17 EF 45-50% // Echo 05/2019: EF 50-55 // Echocardiogram 8/21: EF 50, inf AK, post and mid inf HK, mod LVH, RVSP 25.3, mild to mod LAE, trivial MR    Depression    Diabetes mellitus ([place])    a. A1c 8.8 08/2012->Metformin initiated. => b. A1c (9/14): 6.6   Gastroesophageal reflux disease    History of nuclear stress test    a. Myoview 1/17: EF 35%, fixed inferior lateral defect consistent with infarct, no ischemia, intermediate risk   History of pneumonia    History of stroke 01/2021   Zio Monitor 11/22: NSR, avg HR 61; PVCs (1.9%); no AFib, no high grade HB, no ventricular arrhythmias   HTN (hypertension)    Hx of NSTEMI    Hyperlipidemia    Ischemic cardiomyopathy    a. EF 40%; improved to normal;  b. 08/2012 Echo: EF 50-55%, mod LVH.//  c. Echo 9/16: Inf HK, mild LVH, EF 55%, mild LAE, normal RVF, mild RAE, PASP 35 mmHg  //  d. Echo 1/17: EF 45-50%, inferior HK, mild BAE, PASP 33 mmHg   Obesity    OSA (obstructive sleep apnea)    Does not use CPAP as of 05/2011   Tobacco abuse     Medications:  (  Not in a hospital admission)  Scheduled:  Infusions:   amiodarone 60 mg/hr (08/11/22 1756)   [START ON 08/12/2022] amiodarone     lidocaine 1 mg/min (08/11/22 1813)   PRN:   Assessment: 54 yom with a history of CAD, HLD, HTN, CKD, HF, SVT, DM, neuropathy, OSA, CVA, asthma, GERD. Patient is presenting with chest pain. Heparin per pharmacy consult placed for chest pain/ACS.  Runs of vtach in ED received amiodarone and lidocaine in the ED.  Patient is not on anticoagulation prior to arrival.  Hgb 17.2; plt 193  Goal of Therapy:  Heparin level 0.3-0.7 units/ml Monitor platelets by anticoagulation protocol: Yes   Plan:  Give IV heparin 4000 units bolus x 1 Start heparin infusion at 1500 units/hr Check anti-Xa level at 0200 and daily while on heparin Continue to monitor H&H and platelets  Lorelei Pont, PharmD,  BCPS 08/11/2022 7:04 PM ED Clinical Pharmacist -  [phone]

## 2022-08-11 NOTE — ED Notes (Signed)
ED TO INPATIENT HANDOFF REPORT  ED Nurse Name and Phone #: Hal Hope [phone]  S Name/Age/Gender [name] [name] 67 y.o. male Room/Bed: 025C/025C  Code Status   Code Status: Prior  Home/SNF/Other Home Patient oriented to: self, place, time, and situation Is this baseline? Yes   Triage Complete: Triage complete  Chief Complaint Ventricular tachycardia ([place]) [I47.20]  Triage Note Pt BIB EMS from home. Per EMS, pt started having chest pain this afternoon and became diaphoretic. EMS found in [place] upon arrival. Pt received '150mg'$  of amiodorone en route. Pt currently A/Ox4.    Allergies Allergies  Allergen Reactions   Penicillin G Other (See Comments)    Did it involve swelling of the face/tongue/throat, SOB, or low BP?  N/A Did it involve sudden or severe rash/hives, skin peeling, or any reaction on the inside of your mouth or nose? N/A Did you need to seek medical attention at a hospital or doctor's office? N/A When did it last happen? Child If all above answers are "NO", may proceed with cephalosporin use.   Penicillins Other (See Comments)    Did it involve swelling of the face/tongue/throat, SOB, or low BP? N/A Did it involve sudden or severe rash/hives, skin peeling, or any reaction on the inside of your mouth or nose?N/A Did you need to seek medical attention at a hospital or doctor's office? N/A When did it last happen? Child      If all above answers are "NO", may proceed with cephalosporin use.    Level of Care/Admitting Diagnosis ED Disposition     ED Disposition  Admit   Condition  --   [place]: [facility] [[id number]]  Level of Care: ICU [6]  May admit patient to [facility] or [facility] if equivalent level of care is available:: No  Covid Evaluation: Asymptomatic - no recent exposure (last 10 days) testing not required  Diagnosis: Ventricular tachycardia [facility]) [id number]  Admitting Physician: Buford Dresser [id number]   Attending Physician: Buford Dresser [id number]  Certification:: I certify this patient will need inpatient services for at least 2 midnights  Estimated Length of Stay: 4          B Medical/Surgery History Past Medical History:  Diagnosis Date   CAD (coronary artery disease)    a. s/p BMS pRCA 2002; b. DES to pRCA & DES p/m RCA 2006; c. PCI/DES OM4 2007; d. PCI/CBA to RCA for ISR 10/2006; e.08/2012 STEMI/Cath/PCI:  LAD 95% >> PCI: Promus Prem DES  //  f. NSTEMI 10/15 >> LHC: mLAD stent ok, dLAD 70, OM1 CTO, OM2 stent ok, dOM2 90, OM3 30-40, dLCx 90, p-mRCA stent ok, mRCA stent ok w/ 60-70 ISR, EF 50% >> med Rx // MV 1/17: no ischemia // Cath ([facility]) 05/2019: RCA 100 (CTO)    Combined systolic and diastolic CHF    a. Myoview 1/17: EF 35%  //  b. Echo 1/17 EF 45-50% // Echo 05/2019: EF 50-55 // Echocardiogram 8/21: EF 50, inf AK, post and mid inf HK, mod LVH, RVSP 25.3, mild to mod LAE, trivial MR    Depression    Diabetes mellitus ([place])    a. A1c 8.8 08/2012->Metformin initiated. => b. A1c (9/14): 6.6   Gastroesophageal reflux disease    History of nuclear stress test    a. Myoview 1/17: EF 35%, fixed inferior lateral defect consistent with infarct, no ischemia, intermediate risk   History of pneumonia    History of stroke 01/2021   [facility]  Monitor 11/22: NSR, avg HR 61; PVCs (1.9%); no AFib, no high grade HB, no ventricular arrhythmias   HTN (hypertension)    Hx of NSTEMI    Hyperlipidemia    Ischemic cardiomyopathy    a. EF 40%; improved to normal;  b. 08/2012 Echo: EF 50-55%, mod LVH.//  c. Echo 9/16: Inf HK, mild LVH, EF 55%, mild LAE, normal RVF, mild RAE, PASP 35 mmHg  //  d. Echo 1/17: EF 45-50%, inferior HK, mild BAE, PASP 33 mmHg   Obesity    OSA (obstructive sleep apnea)    Does not use CPAP as of 05/2011   Tobacco abuse    Past Surgical History:  Procedure Laterality Date   CORONARY ANGIOPLASTY WITH STENT PLACEMENT     CORONARY STENT PLACEMENT  2009   LEFT HEART  CATHETERIZATION WITH CORONARY ANGIOGRAM N/A 08/31/2012   Procedure: LEFT HEART CATHETERIZATION WITH CORONARY ANGIOGRAM;  Surgeon: Burnell Blanks, MD;  Location: [facility] CATH LAB;  Service: Cardiovascular;  Laterality: N/A;   LEFT HEART CATHETERIZATION WITH CORONARY ANGIOGRAM N/A 11/16/2013   Procedure: LEFT HEART CATHETERIZATION WITH CORONARY ANGIOGRAM;  Surgeon: Blane Ohara, MD;  Location: [facility] CATH LAB;  Service: Cardiovascular;  Laterality: N/A;   LEFT HEART CATHETERIZATION WITH CORONARY ANGIOGRAM N/A 03/15/2014   Procedure: LEFT HEART CATHETERIZATION WITH CORONARY ANGIOGRAM;  Surgeon: Peter M Martinique, MD;  Location: [facility] CATH LAB;  Service: Cardiovascular;  Laterality: N/A;   PERCUTANEOUS CORONARY STENT INTERVENTION (PCI-S) Right 08/31/2012   Procedure: PERCUTANEOUS CORONARY STENT INTERVENTION (PCI-S);  Surgeon: Burnell Blanks, MD;  Location: [facility] CATH LAB;  Service: Cardiovascular;  Laterality: Right;   PERCUTANEOUS CORONARY STENT INTERVENTION (PCI-S)  11/16/2013   Procedure: PERCUTANEOUS CORONARY STENT INTERVENTION (PCI-S);  Surgeon: Blane Ohara, MD;  Location: [facility] CATH LAB;  Service: Cardiovascular;;     A IV Location/Drains/Wounds Patient Lines/Drains/Airways Status     Active Line/Drains/Airways     Name Placement date Placement time Site Days   Peripheral IV 08/11/22 20 G Left;Posterior Hand 08/11/22  1747  Hand  less than 1   Peripheral IV 08/11/22 20 G Posterior;Right Hand 08/11/22  1757  Hand  less than 1            Intake/Output Last 24 hours No intake or output data in the 24 hours ending 08/11/22 1859  Labs/Imaging Results for orders placed or performed during the hospital encounter of 08/11/22 (from the past 48 hour(s))  CBC with Differential/Platelet     Status: Abnormal   Collection Time: 08/11/22  5:55 PM  Result Value Ref Range   WBC 8.1 4.0 - 10.5 K/uL   RBC 5.97 (H) 4.22 - 5.81 MIL/uL   Hemoglobin 17.2 (H) 13.0 - 17.0 g/dL   HCT 52.9 (H) 39.0 - 52.0  %   MCV 88.6 80.0 - 100.0 fL   MCH 28.8 26.0 - 34.0 pg   MCHC 32.5 30.0 - 36.0 g/dL   RDW 16.0 (H) 11.5 - 15.5 %   Platelets 193 150 - 400 K/uL   nRBC 0.0 0.0 - 0.2 %   Neutrophils Relative % 31 %   Neutro Abs 2.5 1.7 - 7.7 K/uL   Lymphocytes Relative 54 %   Lymphs Abs 4.5 (H) 0.7 - 4.0 K/uL   Monocytes Relative 10 %   Monocytes Absolute 0.8 0.1 - 1.0 K/uL   Eosinophils Relative 4 %   Eosinophils Absolute 0.3 0.0 - 0.5 K/uL   Basophils Relative 1 %   Basophils Absolute  0.0 0.0 - 0.1 K/uL   Immature Granulocytes 0 %   Abs Immature Granulocytes 0.02 0.00 - 0.07 K/uL    Comment: Performed at [facility], [place] [address], [place], [place] [address]  CBG monitoring, ED     Status: Abnormal   Collection Time: 08/11/22  6:27 PM  Result Value Ref Range   Glucose-Capillary 177 (H) 70 - 99 mg/dL    Comment: Glucose reference range applies only to samples taken after fasting for at least 8 hours.   No results found.  Pending Labs Unresulted Labs (From admission, onward)     Start     Ordered   08/11/22 [id number]  Basic metabolic panel  Once,   STAT        08/11/22 1743   08/11/22 1743  Magnesium  Once,   STAT        08/11/22 1743   08/11/22 1743  Hepatic function panel  Once,   URGENT        08/11/22 1743   08/11/22 1743  Lipase, blood  Once,   STAT        08/11/22 1743   08/11/22 1743  Brain natriuretic peptide (order if patient c/o SOB ONLY)  Once,   URGENT        08/11/22 1743   08/11/22 1743  Rapid urine drug screen (hospital performed)  Once,   STAT        08/11/22 1743   08/11/22 1743  Urinalysis, Routine w reflex microscopic -Urine, Clean Catch  Once,   URGENT       Question:  Specimen Source  Answer:  Urine, Clean Catch   08/11/22 1743            Vitals/Pain Today's Vitals   08/11/22 1742 08/11/22 1745 08/11/22 1800 08/11/22 1815  BP:  (!) 161/101 (!) 161/118   Pulse:  75 (!) 33 82  Resp:  18 18 (!) 22  Temp:  97.9 F (36.6 C)    TempSrc:  Oral    SpO2:   98% 100% 100%  Weight: (!) 140.6 kg     Height: '6\' 1"'$  (1.854 m)     PainSc: 0-No pain       Isolation Precautions No active isolations  Medications Medications  amiodarone (NEXTERONE PREMIX) 360-4.14 MG/200ML-% (1.8 mg/mL) IV infusion (60 mg/hr Intravenous New Bag/Given 08/11/22 1756)  amiodarone (NEXTERONE PREMIX) 360-4.14 MG/200ML-% (1.8 mg/mL) IV infusion (has no administration in time range)  lidocaine (cardiac) 2000 mg in dextrose 5% 500 mL ('4mg'$ /mL) IV infusion (1 mg/min Intravenous New Bag/Given 08/11/22 1813)  lidocaine (cardiac) 100 mg/18m (XYLOCAINE) injection 2% 100 mg (100 mg Intravenous Given 08/11/22 1808)    Mobility walks     Focused Assessments Cardiac Assessment Handoff:  Cardiac Rhythm: Ventricular tachycardia (intermittent Vtach) Lab Results  Component Value Date   CKTOTAL 155 02/16/2022   CKMB 4.6 (H) 01/07/2011   TROPONINI 0.05 (HH) 05/30/2017   Lab Results  Component Value Date   DDIMER 0.71 (H) 05/29/2017   Does the Patient currently have chest pain?  Intermittent   R Recommendations: See Admitting Provider Note  Report given to:   Additional Notes:

## 2022-08-11 NOTE — Progress Notes (Addendum)
   08/11/22 1800  Spiritual Encounters  Type of Visit Initial  Care provided to: Patient  Conversation partners present during encounter Physician  Referral source Patient request;Nurse (RN/NT/LPN)  Reason for visit Urgent spiritual support  OnCall Visit No  Spiritual Framework  Presenting Themes Meaning/purpose/sources of inspiration;Impactful experiences and emotions;Rituals and practive  Patient Stress Factors Family relationships;Loss of control;Lack of knowledge  Family Stress Factors None identified  Interventions  Spiritual Care Interventions Made Established relationship of care and support;Compassionate presence;Reflective listening;Normalization of emotions;Meaning making;Prayer  Intervention Outcomes  Outcomes Connection to spiritual care;Reduced fear;Reduced anxiety   Met with pt at bedside today who was in emotional distress with tears..in ED room - wanting emotional and spiritual support.  Provided support through counseling, active listening and reassurance of good medical care.  Pt appeared to be fearful.  Requested prayer.  Prayed and assured him that evening Chaplain services are able to be contacted.  Ended visit with a departing prayer blessing as well.

## 2022-08-11 NOTE — ED Provider Notes (Signed)
[facility] Provider Note   CSN: [id number] Arrival date & time: 08/11/22  1736     History  Chief Complaint  Patient presents with   Chest Pain    [name] [name] is a 67 y.o. male.   Chest Pain Associated symptoms: diaphoresis   Patient presents for chest pain.  Medical history includes CAD, HLD, HTN, CKD, CHF, SVT, DM, neuropathy, OSA, CVA, asthma, GERD.  Patient was at home, at rest, laying down when he began to experience acute onset of chest pain.  This was approximately 1 hour prior to arrival.  EMS was called.  EMS noted pallor and diaphoresis on scene.  When placed on monitor, EKG showed concern of wide-complex tachycardia.  Patient was given 150 mg of amiodarone prior to arrival.  Patient reports resolution of symptoms on arrival in the ED.     Home Medications Prior to Admission medications   Medication Sig Start Date End Date Taking? Authorizing Provider  albuterol (VENTOLIN HFA) 108 (90 Base) MCG/ACT inhaler Inhale 2 puffs into the lungs every 6 (six) hours as needed for wheezing or shortness of breath. 09/13/21   Biagio Borg, MD  Alcohol Swabs (B-D SINGLE USE SWABS BUTTERFLY) PADS Use up to 4 times a day to test blood sugars. Dx: E10.9, E11.9 09/30/20   Biagio Borg, MD  aspirin 81 MG EC tablet Take 81 mg by mouth daily. Patient not taking: Reported on 07/27/2022 02/06/21   [provider]  atorvastatin (LIPITOR) 80 MG tablet TAKE 1 TABLET(80 MG) BY MOUTH AT BEDTIME 04/23/22   Sherren Mocha, MD  Blood Glucose Calibration (TRUE METRIX LEVEL 1) Low SOLN Use as needed. Dx: E10.9, E11.9 07/27/22   Biagio Borg, MD  Blood Glucose Monitoring Suppl (ONETOUCH VERIO FLEX SYSTEM) DEVI 1 Device by Does not apply route in the morning, at noon, in the evening, and at bedtime. E11.9 08/10/22   Biagio Borg, MD  carvedilol (COREG) 25 MG tablet TAKE 1 TABLET(25 MG) BY MOUTH TWICE DAILY WITH A MEAL 11/09/21   Sherren Mocha, MD   clopidogrel (PLAVIX) 75 MG tablet TAKE 1 TABLET(75 MG) BY MOUTH DAILY 03/31/22   Biagio Borg, MD  dorzolamide-timolol (COSOPT) 2-0.5 % ophthalmic solution SMARTSIG:In Eye(s) 03/12/22   [provider]  empagliflozin (JARDIANCE) 25 MG TABS tablet Take 1 tablet (25 mg total) by mouth daily before breakfast. 07/27/22   Biagio Borg, MD  gabapentin (NEURONTIN) 300 MG capsule Take 1 capsule (300 mg total) by mouth 3 (three) times daily as needed. 07/27/22   Biagio Borg, MD  glucose blood [name] VERIO) test strip Use as instructed four times per day E11.9 08/10/22   Biagio Borg, MD  isosorbide mononitrate (IMDUR) 30 MG 24 hr tablet Take 1 tablet (30 mg total) by mouth daily. 07/27/22   Biagio Borg, MD  Lancets MISC Use as directed four times per day E11.9 08/10/22   Biagio Borg, MD  nitroGLYCERIN (NITROSTAT) 0.4 MG SL tablet PLACE 1 TABLET UNDER THE TONGUE EVERY 5 MINUTES AS NEEDED FOR CHEST PAIN 02/14/21   Biagio Borg, MD  pantoprazole (PROTONIX) 40 MG tablet Take 1 tablet (40 mg total) by mouth daily. 02/02/21   Biagio Borg, MD  PARoxetine (PAXIL) 20 MG tablet TAKE 1 TABLET(20 MG) BY MOUTH DAILY 02/14/22   Biagio Borg, MD  potassium chloride (KLOR-CON) 10 MEQ tablet Take 3 tablets (30 mEq total) by mouth 2 (  two) times daily. 03/01/22   [name] T, PA-C  tirzepatide [facility]) 2.5 MG/0.5ML Pen Inject 2.5 mg into the skin once a week. E11.9 07/27/22   Biagio Borg, MD  torsemide (DEMADEX) 20 MG tablet TAKE 2 TABLETS BY MOUTH IN THE A.M, TAKE 1 TABLET BY MOUTH IN THE P.M. X'S 3 DAYS THEN REDUCE IT TO TAKING 1 TABLET BY MOUTH TWICE A DAY 01/18/22   Biagio Borg, MD  traMADol Veatrice Bourbon) 50 MG tablet 1 tab by mouth four times per day as needed Patient not taking: Reported on 07/27/2022 09/13/21   Biagio Borg, MD  triamcinolone cream (KENALOG) 0.1 % Apply 1 Application topically 2 (two) times daily. 02/22/22 02/22/23  Biagio Borg, MD  vitamin B-12 (CYANOCOBALAMIN) 1000 MCG tablet Take 1  tablet (1,000 mcg total) by mouth daily. 11/25/19   Biagio Borg, MD      Allergies    Penicillin g and Penicillins    Review of Systems   Review of Systems  Constitutional:  Positive for diaphoresis.  Cardiovascular:  Positive for chest pain.  Skin:  Positive for pallor.  All other systems reviewed and are negative.   Physical Exam Updated Vital Signs BP (!) 161/118   Pulse 82   Temp 97.9 F (36.6 C) (Oral)   Resp (!) 22   Ht '6\' 1"'$  (1.854 m)   Wt (!) 140.6 kg   SpO2 100%   BMI 40.90 kg/m  Physical Exam Vitals and nursing note reviewed.  Constitutional:      General: He is not in acute distress.    Appearance: He is well-developed. He is not ill-appearing, toxic-appearing or diaphoretic.  HENT:     Head: Normocephalic and atraumatic.  Eyes:     Conjunctiva/sclera: Conjunctivae normal.  Cardiovascular:     Rate and Rhythm: Normal rate. Rhythm irregular.     Heart sounds: No murmur heard. Pulmonary:     Effort: Pulmonary effort is normal. No tachypnea or respiratory distress.     Breath sounds: Normal breath sounds. No decreased breath sounds, wheezing, rhonchi or rales.  Abdominal:     Palpations: Abdomen is soft.     Tenderness: There is no abdominal tenderness.  Musculoskeletal:        General: No swelling.     Cervical back: Normal range of motion and neck supple.     Right lower leg: No edema.     Left lower leg: No edema.  Skin:    General: Skin is warm and dry.     Capillary Refill: Capillary refill takes less than 2 seconds.     Coloration: Skin is not cyanotic or pale.  Neurological:     General: No focal deficit present.     Mental Status: He is alert and oriented to person, place, and time.  Psychiatric:        Mood and Affect: Mood normal.        Behavior: Behavior normal.     ED Results / Procedures / Treatments   Labs (all labs ordered are listed, but only abnormal results are displayed) Labs Reviewed  CBC WITH DIFFERENTIAL/PLATELET -  Abnormal; Notable for the following components:      Result Value   RBC 5.97 (*)    Hemoglobin 17.2 (*)    HCT 52.9 (*)    RDW 16.0 (*)    Lymphs Abs 4.5 (*)    All other components within normal limits  CBG MONITORING, ED - Abnormal; Notable  for the following components:   Glucose-Capillary 177 (*)    All other components within normal limits  BASIC METABOLIC PANEL  MAGNESIUM  HEPATIC FUNCTION PANEL  LIPASE, BLOOD  BRAIN NATRIURETIC PEPTIDE  RAPID URINE DRUG SCREEN, HOSP PERFORMED  URINALYSIS, ROUTINE W REFLEX MICROSCOPIC  TROPONIN I (HIGH SENSITIVITY)    EKG None  Radiology No results found.  Procedures Procedures    Medications Ordered in ED Medications  amiodarone (NEXTERONE PREMIX) 360-4.14 MG/200ML-% (1.8 mg/mL) IV infusion (60 mg/hr Intravenous New Bag/Given 08/11/22 1756)  amiodarone (NEXTERONE PREMIX) 360-4.14 MG/200ML-% (1.8 mg/mL) IV infusion (has no administration in time range)  lidocaine (cardiac) 2000 mg in dextrose 5% 500 mL ('4mg'$ /mL) IV infusion (1 mg/min Intravenous New Bag/Given 08/11/22 1813)  lidocaine (cardiac) 100 mg/79m (XYLOCAINE) injection 2% 100 mg (100 mg Intravenous Given 08/11/22 1808)    ED Course/ Medical Decision Making/ A&P                             Medical Decision Making Amount and/or Complexity of Data Reviewed Labs: ordered. Radiology: ordered.  Risk Prescription drug management.   This patient presents to the ED for concern of chest pain, this involves an extensive number of treatment options, and is a complaint that carries with it a high risk of complications and morbidity.  The differential diagnosis includes ACS, ventricular tachycardia, other arrhythmia, CHF, pericarditis   Co morbidities that complicate the patient evaluation  CAD, HLD, HTN, CKD, CHF, SVT, DM, neuropathy, OSA, CVA, asthma, GERD   Additional history obtained:  Additional history obtained from EMS External records from outside source obtained and  reviewed including EMR   Lab Tests:  I Ordered, and personally interpreted labs.  The pertinent results include: Slight hyperglycemia.  Remaining lab work pending at time of admission.   Imaging Studies ordered:  I ordered imaging studies including chest x-ray I independently visualized and interpreted imaging which showed (pending at time of admission) I agree with the radiologist interpretation   Cardiac Monitoring: / EKG:  The patient was maintained on a cardiac monitor.  I personally viewed and interpreted the cardiac monitored which showed an underlying rhythm of: Ventricular tachycardia, subsequently normal sinus rhythm   Consultations Obtained:  I requested consultation with the cardiologist, Dr. CHarrell Gave  and discussed lab and imaging findings as well as pertinent plan - they recommend: Admission to cardiac ICU   Problem List / ED Course / Critical interventions / Medication management  Patient presents for acute onset of chest pain.  EMS noted wide-complex tachycardia prior to arrival.  He did receive a bolus of 150 mg of amiodarone.  On arrival, patient reports resolution of chest pain.  EKG shows sinus rhythm with frequent PVCs.  There is concern for slight ST segment elevation in the inferior leads, although this is difficult to identify with the frequent PVCs.  He does have precordial ST segment depressions which appear to be present on prior EKGs.  I spoke with cardiology who will come see the patient.  Lab workup was initiated.  Patient was already given 324 of ASA prior to arrival.  Amiodarone infusion was started.  Patient had continued runs of ventricular tachycardia.  Cardiologist, Dr. CHarrell Gave came and evaluated the patient at bedside.  Patient was given an additional 150 mg bolus of amiodarone.  He was given a 100 mg bolus of lidocaine.  This did seem to resolve the frequency and severity of the V. tach  runs.  Dr. Harrell Gave advised initiation of heparin and  admission to cardiac ICU.  She will speak with STEMI doctor regarding possible cath today, otherwise it would likely be done on Monday.  Patient was admitted for further management. I ordered medication including amiodarone and lidocaine for intraocular tachycardia; heparin for ACS Reevaluation of the patient after these medicines showed that the patient improved I have reviewed the patients home medicines and have made adjustments as needed   Social Determinants of Health:  Lives independently  CRITICAL CARE Performed by: Godfrey Pick   Total critical care time: 35 minutes  Critical care time was exclusive of separately billable procedures and treating other patients.  Critical care was necessary to treat or prevent imminent or life-threatening deterioration.  Critical care was time spent personally by me on the following activities: development of treatment plan with patient and/or surrogate as well as nursing, discussions with consultants, evaluation of patient's response to treatment, examination of patient, obtaining history from patient or surrogate, ordering and performing treatments and interventions, ordering and review of laboratory studies, ordering and review of radiographic studies, pulse oximetry and re-evaluation of patient's condition.          Final Clinical Impression(s) / ED Diagnoses Final diagnoses:  Ventricular tachycardia [facility])    Rx / DC Orders ED Discharge Orders     None         Godfrey Pick, MD 08/11/22 [phone]

## 2022-08-11 NOTE — H&P (Signed)
Cardiology History & Physical   Patient ID: Trenell Parrow MRN: [id number]; DOB: 23-Aug-1955  Admit date: 08/11/2022 Date of Consult: 08/11/2022  PCP:  Biagio Borg, MD   [place] Providers Cardiologist:  Sherren Mocha, MD  Cardiology APP:  Liliane Shi, PA-C       Patient Profile:   Loni Peaches is a 67 y.o. male with a hx of CAD, type II diabetes, ischemic cardiomyopathy, PVCs who is being seen 08/11/2022 for the evaluation of ventricular tachycardia at the request of Dr. Doren Custard.  History of Present Illness:   Mr. Feemster was in his usual state of health until this afternoon. He was laying down and then noted central chest discomfort followed by racing heart beats. No shortness of breath, nausea/vomiting, did feel somewhat diaphoretic. Called EMS. On EMS arrival, patient was in VT. He received 150 mg bolus of amiodarone prior to arrival.  While in the ER, he had intermittent episodes of NSVT and then began having sustained VT. Longest episode in ER was 2.5 minutes of continuous VT ending at 6:04 PM. This was on amio drip. I recommended re-bolusing 150 mg amiodarone and giving lidocaine. He received 100 mg IV lidocaine and then was started on lidocaine drip. This significantly improved his discomfort.   He was most recently seen by [name], PA on 02/27/22. Per that note, his history includes: Coronary artery disease S/p PCI to RCA and PL1 S/p cutting POBA to RCA 2/2 ISR in 5/08 S/p ant STEMI in 3/14 >> PCI: DES to LAD S/p NSTEMI in 6/15 >> PCI: DES to [place] S/p NSTEMI 10/15 >> Med Rx Myoview 1/17: no ischemia Cath [facility] 05/2019: RCA 100 CTO Heart failure with preserved ejection fraction  Ischemic CM Echocardiogram 05/2019 ([facility]): EF 50-55 Echocardiogram 8/21: EF 50, inf AK Echo 9/23: EF 45-50, GR 1 DD Supraventricular tachycardia Monitor 9/21: Longest 17 seconds beta-blocker increased  PVCs (monitor 9/21: 6% burden) beta-blocker Rx   Hypertension Hyperlipidemia Chronic kidney disease Diabetes mellitus OSA GERD Tobacco abuse Aortic atherosclerosis  Hx of TIA in Aug 22  No similar recent events. Otherwise had been in his normal state of health.  Past Medical History:  Diagnosis Date   CAD (coronary artery disease)    a. s/p BMS pRCA 2002; b. DES to pRCA & DES p/m RCA 2006; c. PCI/DES OM4 2007; d. PCI/CBA to RCA for ISR 10/2006; e.08/2012 STEMI/Cath/PCI:  LAD 95% >> PCI: Promus Prem DES  //  f. NSTEMI 10/15 >> LHC: mLAD stent ok, dLAD 70, OM1 CTO, OM2 stent ok, dOM2 90, OM3 30-40, dLCx 90, p-mRCA stent ok, mRCA stent ok w/ 60-70 ISR, EF 50% >> med Rx // MV 1/17: no ischemia // Cath ([facility]) 05/2019: RCA 100 (CTO)    Combined systolic and diastolic CHF    a. Myoview 1/17: EF 35%  //  b. Echo 1/17 EF 45-50% // Echo 05/2019: EF 50-55 // Echocardiogram 8/21: EF 50, inf AK, post and mid inf HK, mod LVH, RVSP 25.3, mild to mod LAE, trivial MR    Depression    Diabetes mellitus ([place])    a. A1c 8.8 08/2012->Metformin initiated. => b. A1c (9/14): 6.6   Gastroesophageal reflux disease    History of nuclear stress test    a. Myoview 1/17: EF 35%, fixed inferior lateral defect consistent with infarct, no ischemia, intermediate risk   History of pneumonia    History of stroke 01/2021   Zio Monitor 11/22: NSR, avg HR 61;  PVCs (1.9%); no AFib, no high grade HB, no ventricular arrhythmias   HTN (hypertension)    Hx of NSTEMI    Hyperlipidemia    Ischemic cardiomyopathy    a. EF 40%; improved to normal;  b. 08/2012 Echo: EF 50-55%, mod LVH.//  c. Echo 9/16: Inf HK, mild LVH, EF 55%, mild LAE, normal RVF, mild RAE, PASP 35 mmHg  //  d. Echo 1/17: EF 45-50%, inferior HK, mild BAE, PASP 33 mmHg   Obesity    OSA (obstructive sleep apnea)    Does not use CPAP as of 05/2011   Tobacco abuse     Past Surgical History:  Procedure Laterality Date   CORONARY ANGIOPLASTY WITH STENT PLACEMENT     CORONARY STENT PLACEMENT  2009   LEFT HEART  CATHETERIZATION WITH CORONARY ANGIOGRAM N/A 08/31/2012   Procedure: LEFT HEART CATHETERIZATION WITH CORONARY ANGIOGRAM;  Surgeon: Burnell Blanks, MD;  Location: [facility] CATH LAB;  Service: Cardiovascular;  Laterality: N/A;   LEFT HEART CATHETERIZATION WITH CORONARY ANGIOGRAM N/A 11/16/2013   Procedure: LEFT HEART CATHETERIZATION WITH CORONARY ANGIOGRAM;  Surgeon: Blane Ohara, MD;  Location: [facility] CATH LAB;  Service: Cardiovascular;  Laterality: N/A;   LEFT HEART CATHETERIZATION WITH CORONARY ANGIOGRAM N/A 03/15/2014   Procedure: LEFT HEART CATHETERIZATION WITH CORONARY ANGIOGRAM;  Surgeon: Peter M Martinique, MD;  Location: [facility] CATH LAB;  Service: Cardiovascular;  Laterality: N/A;   PERCUTANEOUS CORONARY STENT INTERVENTION (PCI-S) Right 08/31/2012   Procedure: PERCUTANEOUS CORONARY STENT INTERVENTION (PCI-S);  Surgeon: Burnell Blanks, MD;  Location: [facility] CATH LAB;  Service: Cardiovascular;  Laterality: Right;   PERCUTANEOUS CORONARY STENT INTERVENTION (PCI-S)  11/16/2013   Procedure: PERCUTANEOUS CORONARY STENT INTERVENTION (PCI-S);  Surgeon: Blane Ohara, MD;  Location: [facility] CATH LAB;  Service: Cardiovascular;;     Home Medications:  Prior to Admission medications   Medication Sig Start Date End Date Taking? Authorizing Provider  albuterol (VENTOLIN HFA) 108 (90 Base) MCG/ACT inhaler Inhale 2 puffs into the lungs every 6 (six) hours as needed for wheezing or shortness of breath. 09/13/21   Biagio Borg, MD  Alcohol Swabs (B-D SINGLE USE SWABS BUTTERFLY) PADS Use up to 4 times a day to test blood sugars. Dx: E10.9, E11.9 09/30/20   Biagio Borg, MD  aspirin 81 MG EC tablet Take 81 mg by mouth daily. Patient not taking: Reported on 07/27/2022 02/06/21   [provider]  atorvastatin (LIPITOR) 80 MG tablet TAKE 1 TABLET(80 MG) BY MOUTH AT BEDTIME 04/23/22   Sherren Mocha, MD  Blood Glucose Calibration (TRUE METRIX LEVEL 1) Low SOLN Use as needed. Dx: E10.9, E11.9 07/27/22   Biagio Borg,  MD  Blood Glucose Monitoring Suppl (ONETOUCH VERIO FLEX SYSTEM) DEVI 1 Device by Does not apply route in the morning, at noon, in the evening, and at bedtime. E11.9 08/10/22   Biagio Borg, MD  carvedilol (COREG) 25 MG tablet TAKE 1 TABLET(25 MG) BY MOUTH TWICE DAILY WITH A MEAL 11/09/21   Sherren Mocha, MD  clopidogrel (PLAVIX) 75 MG tablet TAKE 1 TABLET(75 MG) BY MOUTH DAILY 03/31/22   Biagio Borg, MD  dorzolamide-timolol (COSOPT) 2-0.5 % ophthalmic solution SMARTSIG:In Eye(s) 03/12/22   [provider]  empagliflozin (JARDIANCE) 25 MG TABS tablet Take 1 tablet (25 mg total) by mouth daily before breakfast. 07/27/22   Biagio Borg, MD  gabapentin (NEURONTIN) 300 MG capsule Take 1 capsule (300 mg total) by mouth 3 (three) times daily as needed. 07/27/22  Biagio Borg, MD  glucose blood [facility] VERIO) test strip Use as instructed four times per day E11.9 08/10/22   Biagio Borg, MD  isosorbide mononitrate (IMDUR) 30 MG 24 hr tablet Take 1 tablet (30 mg total) by mouth daily. 07/27/22   Biagio Borg, MD  Lancets MISC Use as directed four times per day E11.9 08/10/22   Biagio Borg, MD  nitroGLYCERIN (NITROSTAT) 0.4 MG SL tablet PLACE 1 TABLET UNDER THE TONGUE EVERY 5 MINUTES AS NEEDED FOR CHEST PAIN 02/14/21   Biagio Borg, MD  pantoprazole (PROTONIX) 40 MG tablet Take 1 tablet (40 mg total) by mouth daily. 02/02/21   Biagio Borg, MD  PARoxetine (PAXIL) 20 MG tablet TAKE 1 TABLET(20 MG) BY MOUTH DAILY 02/14/22   Biagio Borg, MD  potassium chloride (KLOR-CON) 10 MEQ tablet Take 3 tablets (30 mEq total) by mouth 2 (two) times daily. 03/01/22   [name] T, PA-C  tirzepatide [facility]) 2.5 MG/0.5ML Pen Inject 2.5 mg into the skin once a week. E11.9 07/27/22   Biagio Borg, MD  torsemide (DEMADEX) 20 MG tablet TAKE 2 TABLETS BY MOUTH IN THE A.M, TAKE 1 TABLET BY MOUTH IN THE P.M. X'S 3 DAYS THEN REDUCE IT TO TAKING 1 TABLET BY MOUTH TWICE A DAY 01/18/22   Biagio Borg, MD  traMADol Veatrice Bourbon)  50 MG tablet 1 tab by mouth four times per day as needed Patient not taking: Reported on 07/27/2022 09/13/21   Biagio Borg, MD  triamcinolone cream (KENALOG) 0.1 % Apply 1 Application topically 2 (two) times daily. 02/22/22 02/22/23  Biagio Borg, MD  vitamin B-12 (CYANOCOBALAMIN) 1000 MCG tablet Take 1 tablet (1,000 mcg total) by mouth daily. 11/25/19   Biagio Borg, MD    Inpatient Medications: Scheduled Meds:  heparin  4,000 Units Intravenous Once   Continuous Infusions:  amiodarone 60 mg/hr (08/11/22 1756)   [START ON 08/12/2022] amiodarone     heparin     lidocaine 1 mg/min (08/11/22 1813)   PRN Meds:   Allergies:    Allergies  Allergen Reactions   Penicillin G Other (See Comments)    Did it involve swelling of the face/tongue/throat, SOB, or low BP?  N/A Did it involve sudden or severe rash/hives, skin peeling, or any reaction on the inside of your mouth or nose? N/A Did you need to seek medical attention at a hospital or doctor's office? N/A When did it last happen? Child If all above answers are "NO", may proceed with cephalosporin use.   Penicillins Other (See Comments)    Did it involve swelling of the face/tongue/throat, SOB, or low BP? N/A Did it involve sudden or severe rash/hives, skin peeling, or any reaction on the inside of your mouth or nose?N/A Did you need to seek medical attention at a hospital or doctor's office? N/A When did it last happen? Child      If all above answers are "NO", may proceed with cephalosporin use.    Social History:   Social History   Socioeconomic History   Marital status: Single    Spouse name: Not on file   Number of children: 0   Years of education: Not on file   Highest education level: Not on file  Occupational History   Occupation: retired  Tobacco Use   Smoking status: Former    Packs/day: 0.50    Years: 30.00    Total pack years: 15.00    Types:  Cigarettes   Smokeless tobacco: Never   Tobacco comments:    trying to  cut down  Vaping Use   Vaping Use: Never used  Substance and Sexual Activity   Alcohol use: No   Drug use: No    Comment: remote marijuana use   Sexual activity: Not Currently  Other Topics Concern   Not on file  Social History Narrative   Lives alone.  He has no children.   He retired early due to health problems.         Social Determinants of Health   Financial Resource Strain: Low Risk  (09/20/2021)   Overall Financial Resource Strain (CARDIA)    Difficulty of Paying Living Expenses: Not hard at all  Food Insecurity: Food Insecurity Present (02/16/2022)   Hunger Vital Sign    Worried About Running Out of Food in the Last Year: Sometimes true    Ran Out of Food in the Last Year: Sometimes true  Transportation Needs: No Transportation Needs (02/16/2022)   PRAPARE - Hydrologist (Medical): No    Lack of Transportation (Non-Medical): No  Physical Activity: Inactive (09/20/2021)   Exercise Vital Sign    Days of Exercise per Week: 0 days    Minutes of Exercise per Session: 0 min  Stress: No Stress Concern Present (09/20/2021)   [place]    Feeling of Stress : Not at all  Social Connections: Socially Isolated (09/20/2021)   Social Connection and Isolation Panel [NHANES]    Frequency of Communication with Friends and Family: More than three times a week    Frequency of Social Gatherings with Friends and Family: More than three times a week    Attends Religious Services: Never    Marine scientist or Organizations: No    Attends Archivist Meetings: Never    Marital Status: Never married  Intimate Partner Violence: Not At Risk (02/16/2022)   Humiliation, Afraid, Rape, and Kick questionnaire    Fear of Current or Ex-Partner: No    Emotionally Abused: No    Physically Abused: No    Sexually Abused: No    Family History:    Family History  Problem Relation Age of Onset    Depression Mother    Cancer Mother        ovarian   Hypertension Mother        Died, 61   Other Father        motor vehicle accident   Coronary artery disease Other    Heart attack Neg Hx    Stroke Neg Hx      ROS:  Please see the history of present illness.  Constitutional: Negative for chills, fever, night sweats, unintentional weight loss  HENT: Negative for ear pain and hearing loss.   Eyes: Negative for loss of vision and eye pain.  Respiratory: Negative for cough, sputum, wheezing.   Cardiovascular: See HPI. Gastrointestinal: Negative for abdominal pain, melena, and hematochezia.  Genitourinary: Negative for dysuria and hematuria.  Musculoskeletal: Negative for falls and myalgias.  Skin: Negative for itching and rash.  Neurological: Negative for focal weakness, focal sensory changes and loss of consciousness.  Endo/Heme/Allergies: Does not bruise/bleed easily.   All other ROS reviewed and negative.     Physical Exam/Data:   Vitals:   08/11/22 1800 08/11/22 1815 08/11/22 1900 08/11/22 1910  BP: (!) 161/118  (!) 136/105 137/81  Pulse: (!) 33 82  80 80  Resp: 18 (!) 22 (!) 21 17  Temp:      TempSrc:      SpO2: 100% 100% 99% 98%  Weight:      Height:       No intake or output data in the 24 hours ending 08/11/22 1926    08/11/2022    5:42 PM 07/27/2022    8:17 AM 02/27/2022   11:20 AM  Last 3 Weights  Weight (lbs) 310 lb 321 lb 324 lb 12.8 oz  Weight (kg) 140.615 kg 145.605 kg 147.328 kg     Body mass index is 40.9 kg/m.  General:  Well nourished, well developed, in no acute distress HEENT: normal Neck: no JVD appreciated Vascular: No carotid bruits; Distal pulses 2+ bilaterally Cardiac:  normal S1, S2; RRR; no murmur appreciated Lungs:  clear to auscultation bilaterally, no wheezing, rhonchi or rales  Abd: soft, nontender, no hepatomegaly  Ext: no edema Musculoskeletal:  No deformities, BUE and BLE strength normal and equal Skin: warm and dry  Neuro:  CNs  2-12 intact, no focal abnormalities noted Psych:  Normal affect   EKG:  The EKG was personally reviewed and demonstrates:  initial ECG sinus with frequent PVCs, later ventricular tachycardia at 214 bpm, then sinus with RBBB and inferior Q waves Telemetry:  Telemetry was personally reviewed and demonstrates:  SR with intermittent NSVT and VT  Relevant CV Studies: Cath 05/14/2019 [facility]) Diagnostic Summary  RCA CTO  diffuse mild nonobstructive LAD and CIRC disease  Diagnostic Recommendations  medical therapy for CAD. repeat PCI would be associated with high restenosis  risk, and little LV function improvement since the IP wall is akinetic.   Signatures   Electronically signed by Alinda Money, MD,   [facility] Interventional Physician  on 05-18-2019 15 02    Angiographic Findings   Cardiac Arteries and Lesion Findings  LMCA  Normal.  LAD  Diffuse irregularity.  LCx  Diffuse irregularity.  RCA  Chronic occlusion.    Lesion on Mid RCA  Distal subsection.100% stenosis 25 mm length . Pre    procedure TIMI I flow was noted. Poor run off was present.    Comments diffuse proliferative ISR followed by total occlusion. moderate    collaterals     Prior CV Studies: ECHO COMPLETE WITH IMAGING ENHANCING AGENT 02/16/2022 Inferobasal HK, EF 45-50, GR 1 DD, normal RVSF, mild LAE    CARDIAC TELEMETRY MONITORING-INTERPRETATION ONLY 04/05/2021 Summary: The basic rhythm is normal sinus with an average heart rate of 61 bpm.  There are occasional PVCs with an overall burden of 1.9%.  There are no prolonged ventricular arrhythmias.  There is no atrial fibrillation or flutter identified.  There is no high-grade AV block noted.    ECHOCARDIOGRAM 02/06/21 EF 45-50, global HK, moderate LVH, G1 DD, RVSP 15.1, mild LAE, trivial AI, dilated aortic root (37 mm)   ZIO monitor 02/2020 NSR, Avg HR 59; No AFib/Flutter; PVCs 6% burden; occ PACs, rare Supraventricular Tachycardia runs   Echocardiogram 01/14/20 EF 50,  inf AK , post and inf HK, mod LVH, normal RVSF, RVSP 25.3, mild to mod LAE, trivial MR,    Cardiac catheterization 05/14/2019 ([facility]) LM normal LAD diff irregs LCx diffuse irregs RCA mid 100 (CTO)   Echocardiogram 05/13/2019 ([facility]) Mod conc LVH, inf AK and likely post HK, EF 50-55   Myoview 06/16/15 Intermediate risk stress nuclear study with a large, severe, predominantly fixed inferior lateral defect consistent with prior infarct; no significant  ischemia; EF 35 but visually appears better; akinesis of the basal and mid inferior lateral wall; mild LVE; suggest echo to better assess LV function. Study intermediate risk due to reduced LV function.    Carotid US (4/13):  Bilateral: no ICA stenosis  Laboratory Data:  High Sensitivity Troponin:  No results for input(s): "TROPONINIHS" in the last 720 hours.   ChemistryNo results for input(s): "NA", "K", "CL", "CO2", "GLUCOSE", "BUN", "CREATININE", "CALCIUM", "MG", "GFRNONAA", "GFRAA", "ANIONGAP" in the last 168 hours.  No results for input(s): "PROT", "ALBUMIN", "AST", "ALT", "ALKPHOS", "BILITOT" in the last 168 hours. Lipids No results for input(s): "CHOL", "TRIG", "HDL", "LABVLDL", "LDLCALC", "CHOLHDL" in the last 168 hours.  Hematology Recent Labs  Lab 08/11/22 1755  WBC 8.1  RBC 5.97*  HGB 17.2*  HCT 52.9*  MCV 88.6  MCH 28.8  MCHC 32.5  RDW 16.0*  PLT 193   Thyroid No results for input(s): "TSH", "FREET4" in the last 168 hours.  BNPNo results for input(s): "BNP", "PROBNP" in the last 168 hours.  DDimer No results for input(s): "DDIMER" in the last 168 hours.   Radiology/Studies:  DG Chest Portable 1 View  Result Date: 08/11/2022 CLINICAL DATA:  Chest pain EXAM: PORTABLE CHEST 1 VIEW COMPARISON:  02/15/2022 FINDINGS: Cardiac shadow is enlarged but stable. Mild central vascular congestion is seen without focal infiltrate or effusion. No bony abnormality is noted. IMPRESSION: Mild central vascular congestion.  No edema is seen.  Electronically Signed   By: Inez Catalina M.D.   On: 08/11/2022 19:06     Assessment and Plan:   CAD Hypercholesterolemia -prior history of multiple PCI, known CTO RCA -lipids 07/27/22 show Tchol 218, TG 67, HDL 48, LDL 156 (goal <55). Not at goal on atorvastatin 80 mg daily -continue aspirin -expect hsTn will be elevated, start heparin -continue imdur -Ideally would like to cool him off from his VT and then cath him on 3/4. However, if VT cannot be controlled medically, may need to urgently cath. He does not have discomfort when not in VT.  Ischemic cardiomyopathy Ventricular tachycardia History of PVCs -required both amiodarone and lidocaine to stop VT, still with intermittent NSVT -recent TSH normal -last EF 45-50% -continue carvedilol 25 mg BID -repeat echo in AM -on torsemide '20mg'$  BID, hold for now  Chronic kidney disease, stage 3b -Cr 1.62, GFR 44 on 07/27/22  Type II diabetes -last A1c 7.3 -started Mounjaro yesterday -on empagliflozin  Risk Assessment/Risk Scores:     TIMI Risk Score for Unstable Angina or Non-ST Elevation MI:   The patient's TIMI risk score is 7, which indicates a 41% risk of all cause mortality, new or recurrent myocardial infarction or need for urgent revascularization in the next 14 days.  New York Heart Association (NYHA) Functional Class NYHA Class I        For questions or updates, please contact [place] Please consult [email or web address] for contact info under    Signed, Buford Dresser, MD  08/11/2022 7:26 PM

## 2022-08-11 NOTE — ED Notes (Signed)
Report attempted x 1

## 2022-08-11 NOTE — ED Triage Notes (Signed)
Pt BIB EMS from home. Per EMS, pt started having chest pain this afternoon and became diaphoretic. EMS found in [place] upon arrival. Pt received '150mg'$  of amiodorone en route. Pt currently A/Ox4.

## 2022-08-12 ENCOUNTER — Inpatient Hospital Stay (HOSPITAL_COMMUNITY): Payer: 59

## 2022-08-12 DIAGNOSIS — I472 Ventricular tachycardia, unspecified: Secondary | ICD-10-CM

## 2022-08-12 DIAGNOSIS — I255 Ischemic cardiomyopathy: Secondary | ICD-10-CM

## 2022-08-12 LAB — BASIC METABOLIC PANEL
Anion gap: 10 (ref 5–15)
BUN: 26 mg/dL — ABNORMAL HIGH (ref 8–23)
CO2: 23 mmol/L (ref 22–32)
Calcium: 8.9 mg/dL (ref 8.9–10.3)
Chloride: 104 mmol/L (ref 98–111)
Creatinine, Ser: 1.63 mg/dL — ABNORMAL HIGH (ref 0.61–1.24)
GFR, Estimated: 46 mL/min — ABNORMAL LOW (ref >60)
Glucose, Bld: 134 mg/dL — ABNORMAL HIGH (ref 70–99)
Potassium: 3.1 mmol/L — ABNORMAL LOW (ref 3.5–5.1)
Sodium: 137 mmol/L (ref 135–145)

## 2022-08-12 LAB — POTASSIUM: Potassium: 3.6 mmol/L (ref 3.5–5.1)

## 2022-08-12 LAB — GLUCOSE, CAPILLARY
Glucose-Capillary: 141 mg/dL — ABNORMAL HIGH (ref 70–99)
Glucose-Capillary: 127 mg/dL — ABNORMAL HIGH (ref 70–99)
Glucose-Capillary: 91 mg/dL (ref 70–99)
Glucose-Capillary: 98 mg/dL (ref 70–99)
Glucose-Capillary: 138 mg/dL — ABNORMAL HIGH (ref 70–99)

## 2022-08-12 LAB — CBC
HCT: 48 % (ref 39.0–52.0)
Hemoglobin: 16.3 g/dL (ref 13.0–17.0)
MCH: 29.5 pg (ref 26.0–34.0)
MCHC: 34 g/dL (ref 30.0–36.0)
MCV: 87 fL (ref 80.0–100.0)
Platelets: 184 10*3/uL (ref 150–400)
RBC: 5.52 MIL/uL (ref 4.22–5.81)
RDW: 15.6 % — ABNORMAL HIGH (ref 11.5–15.5)
WBC: 6.5 10*3/uL (ref 4.0–10.5)
nRBC: 0 % (ref 0.0–0.2)

## 2022-08-12 LAB — ECHOCARDIOGRAM COMPLETE
Area-P 1/2: 3.68 cm2
Calc EF: 32.3 %
Height: 73 in
S' Lateral: 5 cm
Single Plane A2C EF: 25.9 %
Single Plane A4C EF: 39.9 %
Weight: 5107.62 oz

## 2022-08-12 LAB — LIPID PANEL
Cholesterol: 212 mg/dL — ABNORMAL HIGH (ref 0–200)
HDL: 46 mg/dL (ref >40)
LDL Cholesterol: 145 mg/dL — ABNORMAL HIGH (ref 0–99)
Total CHOL/HDL Ratio: 4.6 RATIO
Triglycerides: 104 mg/dL (ref <150)
VLDL: 21 mg/dL (ref 0–40)

## 2022-08-12 LAB — HEPARIN LEVEL (UNFRACTIONATED)
Heparin Unfractionated: 0.4 IU/mL (ref 0.30–0.70)
Heparin Unfractionated: 0.43 IU/mL (ref 0.30–0.70)

## 2022-08-12 MED ORDER — POTASSIUM CHLORIDE CRYS ER 20 MEQ PO TBCR: 40.0000 meq | EXTENDED_RELEASE_TABLET | Freq: Once | ORAL | Status: DC

## 2022-08-12 MED ORDER — SODIUM CHLORIDE 0.9% FLUSH
3.0000 mL | Freq: Two times a day (BID) | INTRAVENOUS | Status: DC
  Administered 2022-08-12 – 2022-08-17 (×4): 3 mL via INTRAVENOUS

## 2022-08-12 MED ORDER — ATORVASTATIN CALCIUM 80 MG PO TABS
80.0000 mg | ORAL_TABLET | Freq: Every day | ORAL | Status: DC
  Administered 2022-08-12 – 2022-08-18 (×7): 80 mg via ORAL
  Filled 2022-08-12 (×7): qty 1

## 2022-08-12 MED ORDER — SODIUM CHLORIDE 0.9% FLUSH: 3.0000 mL | INTRAVENOUS | Status: DC | PRN

## 2022-08-12 MED ORDER — INSULIN ASPART 100 UNIT/ML IJ SOLN
0.0000 [IU] | Freq: Three times a day (TID) | INTRAMUSCULAR | Status: DC
  Administered 2022-08-15 – 2022-08-16 (×3): 2 [IU] via SUBCUTANEOUS

## 2022-08-12 MED ORDER — POTASSIUM CHLORIDE CRYS ER 20 MEQ PO TBCR
40.0000 meq | EXTENDED_RELEASE_TABLET | Freq: Once | ORAL | Status: AC
  Administered 2022-08-12: 40 meq via ORAL
  Filled 2022-08-12: qty 2

## 2022-08-12 MED ORDER — ALPRAZOLAM 0.25 MG PO TABS: 0.2500 mg | ORAL_TABLET | Freq: Once | ORAL | Status: DC

## 2022-08-12 MED ORDER — CHLORHEXIDINE GLUCONATE CLOTH 2 % EX PADS
6.0000 | MEDICATED_PAD | Freq: Every day | CUTANEOUS | Status: DC
  Administered 2022-08-12 – 2022-08-17 (×6): 6 via TOPICAL

## 2022-08-12 MED ORDER — CARVEDILOL 6.25 MG PO TABS: 6.2500 mg | ORAL_TABLET | Freq: Two times a day (BID) | ORAL | Status: DC

## 2022-08-12 MED ORDER — AMIODARONE HCL IN DEXTROSE 360-4.14 MG/200ML-% IV SOLN
60.0000 mg/h | INTRAVENOUS | Status: DC
  Administered 2022-08-12 – 2022-08-13 (×6): 60 mg/h via INTRAVENOUS
  Filled 2022-08-12 (×4): qty 200

## 2022-08-12 MED ORDER — POTASSIUM CHLORIDE 10 MEQ/100ML IV SOLN
10.0000 meq | INTRAVENOUS | Status: DC
  Administered 2022-08-12: 10 meq via INTRAVENOUS
  Filled 2022-08-12: qty 100

## 2022-08-12 MED ORDER — SODIUM CHLORIDE 0.9 % IV SOLN: INTRAVENOUS | Status: DC

## 2022-08-12 MED ORDER — SODIUM CHLORIDE 0.9 % IV SOLN: 250.0000 mL | INTRAVENOUS | Status: DC | PRN

## 2022-08-12 MED ORDER — INSULIN ASPART 100 UNIT/ML IJ SOLN: 0.0000 [IU] | Freq: Every day | INTRAMUSCULAR | Status: DC

## 2022-08-12 MED ORDER — CLOPIDOGREL BISULFATE 75 MG PO TABS
75.0000 mg | ORAL_TABLET | Freq: Every day | ORAL | Status: DC
  Administered 2022-08-12 – 2022-08-13 (×2): 75 mg via ORAL
  Filled 2022-08-12 (×3): qty 1

## 2022-08-12 MED ORDER — ALPRAZOLAM 0.5 MG PO TABS
0.5000 mg | ORAL_TABLET | Freq: Once | ORAL | Status: AC
  Administered 2022-08-12: 0.5 mg via ORAL
  Filled 2022-08-12: qty 1

## 2022-08-12 NOTE — Progress Notes (Signed)
   Plan is for left cardiac catheterization tomorrow for further evaluation of ventricular tachycardia. The patient understands that risks include but are not limited to stroke (1 in 1000), death (1 in 33), kidney failure [usually temporary] (1 in 500), bleeding (1 in 200), allergic reaction [possibly serious] (1 in 200), and agrees to proceed. Will make NPO at midnight and place orders.  Repeat potassium 3.6 this afternoon. Will give another dose of Kcl 40 mEq to help keep potassium >4.0.   Darreld Mclean, PA-C 08/12/2022 2:31 PM

## 2022-08-12 NOTE — Progress Notes (Signed)
[place] for Heparin Indication: chest pain/ACS  Allergies  Allergen Reactions   Penicillin G Other (See Comments)    Did it involve swelling of the face/tongue/throat, SOB, or low BP?  N/A Did it involve sudden or severe rash/hives, skin peeling, or any reaction on the inside of your mouth or nose? N/A Did you need to seek medical attention at a hospital or doctor's office? N/A When did it last happen? Child If all above answers are "NO", may proceed with cephalosporin use.   Penicillins Other (See Comments)    Did it involve swelling of the face/tongue/throat, SOB, or low BP? N/A Did it involve sudden or severe rash/hives, skin peeling, or any reaction on the inside of your mouth or nose?N/A Did you need to seek medical attention at a hospital or doctor's office? N/A When did it last happen? Child      If all above answers are "NO", may proceed with cephalosporin use.    Patient Measurements: Height: '6\' 1"'$  (185.4 cm) Weight: (!) 144.8 kg (319 lb 3.6 oz) IBW/kg (Calculated) : 79.9 Heparin Dosing Weight: 112.1 kg  Vital Signs: Temp: 97.7 F (36.5 C) (03/03 0754) Temp Source: Oral (03/03 0754) BP: 132/95 (03/03 0700) Pulse Rate: 51 (03/03 0700)  Labs: Recent Labs    08/11/22 1755 08/11/22 2030 08/12/22 0211 08/12/22 0813  HGB 17.2*  --  16.3  --   HCT 52.9*  --  48.0  --   PLT 193  --  184  --   HEPARINUNFRC  --   --  0.40 0.43  CREATININE 1.76*  --  1.63*  --   TROPONINIHS 51* 146*  --   --      Estimated Creatinine Clearance: 67.7 mL/min (A) (by C-G formula based on SCr of 1.63 mg/dL (H)).   Medical History: Past Medical History:  Diagnosis Date   CAD (coronary artery disease)    a. s/p BMS pRCA 2002; b. DES to pRCA & DES p/m RCA 2006; c. PCI/DES OM4 2007; d. PCI/CBA to RCA for ISR 10/2006; e.08/2012 STEMI/Cath/PCI:  LAD 95% >> PCI: Promus Prem DES  //  f. NSTEMI 10/15 >> LHC: mLAD stent ok, dLAD 70, OM1 CTO, OM2 stent ok,  dOM2 90, OM3 30-40, dLCx 90, p-mRCA stent ok, mRCA stent ok w/ 60-70 ISR, EF 50% >> med Rx // MV 1/17: no ischemia // Cath ([facility]) 05/2019: RCA 100 (CTO)    Combined systolic and diastolic CHF    a. Myoview 1/17: EF 35%  //  b. Echo 1/17 EF 45-50% // Echo 05/2019: EF 50-55 // Echocardiogram 8/21: EF 50, inf AK, post and mid inf HK, mod LVH, RVSP 25.3, mild to mod LAE, trivial MR    Depression    Diabetes mellitus ([place])    a. A1c 8.8 08/2012->Metformin initiated. => b. A1c (9/14): 6.6   Gastroesophageal reflux disease    History of nuclear stress test    a. Myoview 1/17: EF 35%, fixed inferior lateral defect consistent with infarct, no ischemia, intermediate risk   History of pneumonia    History of stroke 01/2021   Zio Monitor 11/22: NSR, avg HR 61; PVCs (1.9%); no AFib, no high grade HB, no ventricular arrhythmias   HTN (hypertension)    Hx of NSTEMI    Hyperlipidemia    Ischemic cardiomyopathy    a. EF 40%; improved to normal;  b. 08/2012 Echo: EF 50-55%, mod LVH.//  c. Echo 9/16: Inf HK,  mild LVH, EF 55%, mild LAE, normal RVF, mild RAE, PASP 35 mmHg  //  d. Echo 1/17: EF 45-50%, inferior HK, mild BAE, PASP 33 mmHg   Obesity    OSA (obstructive sleep apnea)    Does not use CPAP as of 05/2011   Tobacco abuse     Medications:    Scheduled:   aspirin EC  81 mg Oral Daily   atorvastatin  80 mg Oral Daily   carvedilol  6.25 mg Oral BID WC   clopidogrel  75 mg Oral Daily   insulin aspart  0-15 Units Subcutaneous TID WC   insulin aspart  0-5 Units Subcutaneous QHS   Infusions:   amiodarone     amiodarone 60 mg/hr (08/12/22 0830)   heparin 1,500 Units/hr (08/12/22 0700)   lidocaine 1 mg/min (08/12/22 0700)   PRN:   Assessment: 49 yom with a history of CAD, HLD, HTN, CKD, HF, SVT, DM, neuropathy, OSA, CVA, asthma, GERD. Patient is presenting with chest pain. Heparin per pharmacy consult placed for chest pain/ACS.  Runs of vtach in ED received amiodarone and lidocaine in the  ED.  Patient is not on anticoagulation prior to arrival.  Hgb 16.3; plt 184  Goal of Therapy:  Heparin level 0.3-0.7 units/ml Monitor platelets by anticoagulation protocol: Yes   Plan:  Continue IV heparin at 1500 units/hr Daily heparin level and CBC. Continue to monitor H&H and platelets  [place] Crane, Roylene Reason, [facility] Clinical Pharmacist  08/12/2022 11:23 AM   [facility] pharmacy phone numbers are listed on [email or web address]

## 2022-08-12 NOTE — Consult Note (Signed)
Cardiology Consultation   Patient ID: [name] [name] MRN: [id number]; DOB: 1956/01/15  Admit date: 08/11/2022 Date of Consult: 08/12/2022  PCP:  Biagio Borg, MD   [facility] Cardiologist:  Sherren Mocha, MD  Cardiology APP:  Liliane Shi, PA-C       Patient Profile:   [name] [name] is a 67 y.o. male with a hx of chronic systolic heart failure, coronary artery disease, diabetes, PVCs who is being seen 08/12/2022 for the evaluation of ventricular tachycardia at the request of Al Pimple.  History of Present Illness:   [name] [name] was in his usual state of health until yesterday afternoon.  He was laying down and noted chest discomfort.  He presented to the emergency room and was noted to be in ventricular tachycardia.  He received IV amiodarone with some improvement in his VT.  He continued to have episodes of VT and received 100 mg of lidocaine and was started on lidocaine drip.  After starting the lidocaine, his VT significantly improved.  Chest pain also improved.   Past Medical History:  Diagnosis Date   CAD (coronary artery disease)    a. s/p BMS pRCA 2002; b. DES to pRCA & DES p/m RCA 2006; c. PCI/DES OM4 2007; d. PCI/CBA to RCA for ISR 10/2006; e.08/2012 STEMI/Cath/PCI:  LAD 95% >> PCI: Promus Prem DES  //  f. NSTEMI 10/15 >> LHC: mLAD stent ok, dLAD 70, OM1 CTO, OM2 stent ok, dOM2 90, OM3 30-40, dLCx 90, p-mRCA stent ok, mRCA stent ok w/ 60-70 ISR, EF 50% >> med Rx // MV 1/17: no ischemia // Cath ([facility]) 05/2019: RCA 100 (CTO)    Combined systolic and diastolic CHF    a. Myoview 1/17: EF 35%  //  b. Echo 1/17 EF 45-50% // Echo 05/2019: EF 50-55 // Echocardiogram 8/21: EF 50, inf AK, post and mid inf HK, mod LVH, RVSP 25.3, mild to mod LAE, trivial MR    Depression    Diabetes mellitus ([place])    a. A1c 8.8 08/2012->Metformin initiated. => b. A1c (9/14): 6.6   Gastroesophageal reflux disease    History of nuclear stress test    a. Myoview 1/17: EF 35%,  fixed inferior lateral defect consistent with infarct, no ischemia, intermediate risk   History of pneumonia    History of stroke 01/2021   Zio Monitor 11/22: NSR, avg HR 61; PVCs (1.9%); no AFib, no high grade HB, no ventricular arrhythmias   HTN (hypertension)    Hx of NSTEMI    Hyperlipidemia    Ischemic cardiomyopathy    a. EF 40%; improved to normal;  b. 08/2012 Echo: EF 50-55%, mod LVH.//  c. Echo 9/16: Inf HK, mild LVH, EF 55%, mild LAE, normal RVF, mild RAE, PASP 35 mmHg  //  d. Echo 1/17: EF 45-50%, inferior HK, mild BAE, PASP 33 mmHg   Obesity    OSA (obstructive sleep apnea)    Does not use CPAP as of 05/2011   Tobacco abuse     Past Surgical History:  Procedure Laterality Date   CORONARY ANGIOPLASTY WITH STENT PLACEMENT     CORONARY STENT PLACEMENT  2009   LEFT HEART CATHETERIZATION WITH CORONARY ANGIOGRAM N/A 08/31/2012   Procedure: LEFT HEART CATHETERIZATION WITH CORONARY ANGIOGRAM;  Surgeon: Burnell Blanks, MD;  Location: [facility] CATH LAB;  Service: Cardiovascular;  Laterality: N/A;   LEFT HEART CATHETERIZATION WITH CORONARY ANGIOGRAM N/A 11/16/2013   Procedure: LEFT HEART CATHETERIZATION WITH CORONARY ANGIOGRAM;  Surgeon: Blane Ohara, MD;  Location: [facility] CATH LAB;  Service: Cardiovascular;  Laterality: N/A;   LEFT HEART CATHETERIZATION WITH CORONARY ANGIOGRAM N/A 03/15/2014   Procedure: LEFT HEART CATHETERIZATION WITH CORONARY ANGIOGRAM;  Surgeon: Peter M Martinique, MD;  Location: [facility] CATH LAB;  Service: Cardiovascular;  Laterality: N/A;   PERCUTANEOUS CORONARY STENT INTERVENTION (PCI-S) Right 08/31/2012   Procedure: PERCUTANEOUS CORONARY STENT INTERVENTION (PCI-S);  Surgeon: Burnell Blanks, MD;  Location[facility] CATH LAB;  Service: Cardiovascular;  Laterality: Right;   PERCUTANEOUS CORONARY STENT INTERVENTION (PCI-S)  11/16/2013   Procedure: PERCUTANEOUS CORONARY STENT INTERVENTION (PCI-S);  Surgeon: Blane Ohara, MD;  Location: [facility] CATH LAB;  Service: Cardiovascular;;      Home Medications:  Prior to Admission medications   Medication Sig Start Date End Date Taking? Authorizing Provider  atorvastatin (LIPITOR) 80 MG tablet TAKE 1 TABLET(80 MG) BY MOUTH AT BEDTIME Patient taking differently: Take 80 mg by mouth every evening. 04/23/22  Yes Sherren Mocha, MD  carvedilol (COREG) 25 MG tablet TAKE 1 TABLET(25 MG) BY MOUTH TWICE DAILY WITH A MEAL Patient taking differently: Take 25 mg by mouth 2 (two) times daily with a meal. 11/09/21  Yes Sherren Mocha, MD  clopidogrel (PLAVIX) 75 MG tablet TAKE 1 TABLET(75 MG) BY MOUTH DAILY Patient taking differently: Take 75 mg by mouth daily. 03/31/22  Yes Biagio Borg, MD  dorzolamide-timolol (COSOPT) 2-0.5 % ophthalmic solution SMARTSIG:In Eye(s) 03/12/22  Yes [provider]  empagliflozin (JARDIANCE) 25 MG TABS tablet Take 1 tablet (25 mg total) by mouth daily before breakfast. 07/27/22  Yes Biagio Borg, MD  gabapentin (NEURONTIN) 300 MG capsule Take 1 capsule (300 mg total) by mouth 3 (three) times daily as needed. Patient taking differently: Take 300 mg by mouth 3 (three) times daily as needed (For pain). 07/27/22  Yes Biagio Borg, MD  isosorbide mononitrate (IMDUR) 30 MG 24 hr tablet Take 1 tablet (30 mg total) by mouth daily. 07/27/22  Yes Biagio Borg, MD  pantoprazole (PROTONIX) 40 MG tablet Take 1 tablet (40 mg total) by mouth daily. 02/02/21  Yes Biagio Borg, MD  PARoxetine (PAXIL) 20 MG tablet TAKE 1 TABLET(20 MG) BY MOUTH DAILY Patient taking differently: Take 20 mg by mouth daily. 02/14/22  Yes Biagio Borg, MD  potassium chloride (KLOR-CON) 10 MEQ tablet Take 3 tablets (30 mEq total) by mouth 2 (two) times daily. 03/01/22  Yes [name], Scott T, PA-C  tirzepatide [facility]) 2.5 MG/0.5ML Pen Inject 2.5 mg into the skin once a week. E11.9 07/27/22  Yes Biagio Borg, MD  torsemide (DEMADEX) 20 MG tablet TAKE 2 TABLETS BY MOUTH IN THE A.M, TAKE 1 TABLET BY MOUTH IN THE P.M. X'S 3 DAYS THEN REDUCE IT TO  TAKING 1 TABLET BY MOUTH TWICE A DAY Patient taking differently: Take 20 mg by mouth daily. 01/18/22  Yes Biagio Borg, MD  vitamin B-12 (CYANOCOBALAMIN) 1000 MCG tablet Take 1 tablet (1,000 mcg total) by mouth daily. 11/25/19  Yes Biagio Borg, MD  albuterol (VENTOLIN HFA) 108 (90 Base) MCG/ACT inhaler Inhale 2 puffs into the lungs every 6 (six) hours as needed for wheezing or shortness of breath. 09/13/21   Biagio Borg, MD  Alcohol Swabs (B-D SINGLE USE SWABS BUTTERFLY) PADS Use up to 4 times a day to test blood sugars. Dx: E10.9, E11.9 09/30/20   Biagio Borg, MD  aspirin 81 MG EC tablet Take 81 mg by mouth daily. Patient not  taking: Reported on 07/27/2022 02/06/21   [provider]  Blood Glucose Calibration (TRUE METRIX LEVEL 1) Low SOLN Use as needed. Dx: E10.9, E11.9 07/27/22   Biagio Borg, MD  Blood Glucose Monitoring Suppl (ONETOUCH VERIO FLEX SYSTEM) DEVI 1 Device by Does not apply route in the morning, at noon, in the evening, and at bedtime. E11.9 08/10/22   Biagio Borg, MD  glucose blood [facility] VERIO) test strip Use as instructed four times per day E11.9 08/10/22   Biagio Borg, MD  Lancets MISC Use as directed four times per day E11.9 08/10/22   Biagio Borg, MD  nitroGLYCERIN (NITROSTAT) 0.4 MG SL tablet PLACE 1 TABLET UNDER THE TONGUE EVERY 5 MINUTES AS NEEDED FOR CHEST PAIN Patient taking differently: Place 0.4 mg under the tongue every 5 (five) minutes as needed for chest pain. N 02/14/21   Biagio Borg, MD  traMADol Veatrice Bourbon) 50 MG tablet 1 tab by mouth four times per day as needed Patient not taking: Reported on 07/27/2022 09/13/21   Biagio Borg, MD  triamcinolone cream (KENALOG) 0.1 % Apply 1 Application topically 2 (two) times daily. Patient not taking: Reported on 08/11/2022 02/22/22 02/22/23  Biagio Borg, MD    Inpatient Medications: Scheduled Meds:  aspirin EC  81 mg Oral Daily   atorvastatin  80 mg Oral Daily   carvedilol  6.25 mg Oral BID WC   clopidogrel  75 mg  Oral Daily   insulin aspart  0-15 Units Subcutaneous TID WC   insulin aspart  0-5 Units Subcutaneous QHS   Continuous Infusions:  amiodarone     amiodarone 60 mg/hr (08/12/22 1100)   heparin 1,500 Units/hr (08/12/22 1115)   lidocaine 1 mg/min (08/12/22 1100)   PRN Meds: acetaminophen, nitroGLYCERIN  Allergies:    Allergies  Allergen Reactions   Penicillin G Other (See Comments)    Did it involve swelling of the face/tongue/throat, SOB, or low BP?  N/A Did it involve sudden or severe rash/hives, skin peeling, or any reaction on the inside of your mouth or nose? N/A Did you need to seek medical attention at a hospital or doctor's office? N/A When did it last happen? Child If all above answers are "NO", may proceed with cephalosporin use.   Penicillins Other (See Comments)    Did it involve swelling of the face/tongue/throat, SOB, or low BP? N/A Did it involve sudden or severe rash/hives, skin peeling, or any reaction on the inside of your mouth or nose?N/A Did you need to seek medical attention at a hospital or doctor's office? N/A When did it last happen? Child      If all above answers are "NO", may proceed with cephalosporin use.    Social History:   Social History   Socioeconomic History   Marital status: Single    Spouse name: Not on file   Number of children: 0   Years of education: Not on file   Highest education level: Not on file  Occupational History   Occupation: retired  Tobacco Use   Smoking status: Former    Packs/day: 0.50    Years: 30.00    Total pack years: 15.00    Types: Cigarettes   Smokeless tobacco: Never   Tobacco comments:    trying to cut down  Vaping Use   Vaping Use: Never used  Substance and Sexual Activity   Alcohol use: No   Drug use: No    Comment: remote marijuana use  Sexual activity: Not Currently  Other Topics Concern   Not on file  Social History Narrative   Lives alone.  He has no children.   He retired early due to  health problems.         Social Determinants of Health   Financial Resource Strain: Low Risk  (09/20/2021)   Overall Financial Resource Strain (CARDIA)    Difficulty of Paying Living Expenses: Not hard at all  Food Insecurity: Food Insecurity Present (02/16/2022)   Hunger Vital Sign    Worried About Running Out of Food in the Last Year: Sometimes true    Ran Out of Food in the Last Year: Sometimes true  Transportation Needs: No Transportation Needs (02/16/2022)   PRAPARE - Hydrologist (Medical): No    Lack of Transportation (Non-Medical): No  Physical Activity: Inactive (09/20/2021)   Exercise Vital Sign    Days of Exercise per Week: 0 days    Minutes of Exercise per Session: 0 min  Stress: No Stress Concern Present (09/20/2021)   [place]    Feeling of Stress : Not at all  Social Connections: Socially Isolated (09/20/2021)   Social Connection and Isolation Panel [NHANES]    Frequency of Communication with Friends and Family: More than three times a week    Frequency of Social Gatherings with Friends and Family: More than three times a week    Attends Religious Services: Never    Marine scientist or Organizations: No    Attends Archivist Meetings: Never    Marital Status: Never married  Intimate Partner Violence: Not At Risk (02/16/2022)   Humiliation, Afraid, Rape, and Kick questionnaire    Fear of Current or Ex-Partner: No    Emotionally Abused: No    Physically Abused: No    Sexually Abused: No    Family History:    Family History  Problem Relation Age of Onset   Depression Mother    Cancer Mother        ovarian   Hypertension Mother        Died, 56   Other Father        motor vehicle accident   Coronary artery disease Other    Heart attack Neg Hx    Stroke Neg Hx      ROS:  Please see the history of present illness.   All other ROS reviewed and  negative.     Physical Exam/Data:   Vitals:   08/12/22 0800 08/12/22 0900 08/12/22 1000 08/12/22 1100  BP: 127/86 125/78 117/77 128/88  Pulse: (!) 48 (!) 51 (!) 49 (!) 50  Resp: '18 18 16 18  '$ Temp:      TempSrc:      SpO2: 94% 97% 94% 94%  Weight:      Height:        Intake/Output Summary (Last 24 hours) at 08/12/2022 1149 Last data filed at 08/12/2022 1100 Gross per 24 hour  Intake 1436.33 ml  Output 1250 ml  Net 186.33 ml      08/12/2022    6:00 AM 08/11/2022    8:25 PM 08/11/2022    5:42 PM  Last 3 Weights  Weight (lbs) 319 lb 3.6 oz 319 lb 3.6 oz 310 lb  Weight (kg) 144.8 kg 144.8 kg 140.615 kg     Body mass index is 42.12 kg/m.  General:  Well nourished, well developed, in no acute  distress HEENT: normal Neck: no JVD Vascular: No carotid bruits; Distal pulses 2+ bilaterally Cardiac:  normal S1, S2; RRR; no murmur  Lungs:  clear to auscultation bilaterally, no wheezing, rhonchi or rales  Abd: soft, nontender, no hepatomegaly  Ext: no edema Musculoskeletal:  No deformities, BUE and BLE strength normal and equal Skin: warm and dry  Neuro:  CNs 2-12 intact, no focal abnormalities noted Psych:  Normal affect   EKG:  The EKG was personally reviewed and demonstrates: Ventricular tachycardia Telemetry:  Telemetry was personally reviewed and demonstrates: Sinus rhythm  Relevant CV Studies: TTE 02/16/22  1. Global hypokinesis with inferobasal akinesis; overall mild LV  dysfunction.   2. Left ventricular ejection fraction, by estimation, is 45 to 50%. The  left ventricle has mildly decreased function. The left ventricle  demonstrates global hypokinesis. The left ventricular internal cavity size  was moderately dilated. Left ventricular  diastolic parameters are consistent with Grade I diastolic dysfunction  (impaired relaxation).   3. Right ventricular systolic function is normal. The right ventricular  size is normal.   4. Left atrial size was mildly dilated.   5. The  mitral valve is normal in structure. No evidence of mitral valve  regurgitation. No evidence of mitral stenosis.   6. The aortic valve was not well visualized. Aortic valve regurgitation  is not visualized. No aortic stenosis is present.   7. The inferior vena cava is normal in size with greater than 50%  respiratory variability, suggesting right atrial pressure of 3 mmHg.   Laboratory Data:  High Sensitivity Troponin:   Recent Labs  Lab 08/11/22 1755 08/11/22 2030  TROPONINIHS 51* 146*     Chemistry Recent Labs  Lab 08/11/22 1755 08/12/22 0211 08/12/22 1048  NA 139 137  --   K 4.1 3.1* 3.6  CL 102 104  --   CO2 23 23  --   GLUCOSE 147* 134*  --   BUN 25* 26*  --   CREATININE 1.76* 1.63*  --   CALCIUM 9.6 8.9  --   MG 2.2  --   --   GFRNONAA 42* 46*  --   ANIONGAP 14 10  --     Recent Labs  Lab 08/11/22 1755  PROT 7.4  ALBUMIN 3.8  AST 30  ALT 17  ALKPHOS 87  BILITOT 1.3*   Lipids  Recent Labs  Lab 08/12/22 0211  CHOL 212*  TRIG 104  HDL 46  LDLCALC 145*  CHOLHDL 4.6    Hematology Recent Labs  Lab 08/11/22 1755 08/12/22 0211  WBC 8.1 6.5  RBC 5.97* 5.52  HGB 17.2* 16.3  HCT 52.9* 48.0  MCV 88.6 87.0  MCH 28.8 29.5  MCHC 32.5 34.0  RDW 16.0* 15.6*  PLT 193 184   Thyroid No results for input(s): "TSH", "FREET4" in the last 168 hours.  BNP Recent Labs  Lab 08/11/22 1755  BNP 95.3    DDimer No results for input(s): "DDIMER" in the last 168 hours.   Radiology/Studies:  DG Chest Portable 1 View  Result Date: 08/11/2022 CLINICAL DATA:  Chest pain EXAM: PORTABLE CHEST 1 VIEW COMPARISON:  02/15/2022 FINDINGS: Cardiac shadow is enlarged but stable. Mild central vascular congestion is seen without focal infiltrate or effusion. No bony abnormality is noted. IMPRESSION: Mild central vascular congestion.  No edema is seen. Electronically Signed   By: Inez Catalina M.D.   On: 08/11/2022 19:06     Assessment and Plan:   Ventricular tachycardia:  Patient has a history of coronary artery disease.  Saburo Luger plan for left heart catheterization tomorrow.  Echo is currently pending.  Currently on amiodarone and lidocaine with control of ventricular arrhythmias.  For now, would continue current management.  Danny Yackley potentially need ICD therapy at discharge. Coronary artery disease: No current chest pain.  Plan for left heart catheterization tomorrow. Chronic systolic heart failure: Due to ischemic cardiomyopathy.  Continue with current medicines per primary cardiology. CKD stage IIIb.  Patient presented with hypokalemia, potassium of 3.1.  Currently being repleted.    For questions or updates, please contact [place] Please consult [email or web address] for contact info under    Signed, Brendt Dible Meredith Leeds, MD  08/12/2022 11:49 AM

## 2022-08-12 NOTE — H&P (View-Only) (Signed)
Rounding Note    Patient Name: [name] [name] Date of Encounter: 08/12/2022  [place] Cardiologist: Sherren Mocha, MD   Subjective   VT has been decreasing since amiodarone/lidocaine started. Tolerating well. No chest pain or shortness of breath. No neuro changes.  Inpatient Medications    Scheduled Meds:  aspirin EC  81 mg Oral Daily   atorvastatin  80 mg Oral Daily   carvedilol  6.25 mg Oral BID WC   clopidogrel  75 mg Oral Daily   insulin aspart  0-15 Units Subcutaneous TID WC   insulin aspart  0-5 Units Subcutaneous QHS   Continuous Infusions:  amiodarone     amiodarone 60 mg/hr (08/12/22 0830)   heparin 1,500 Units/hr (08/12/22 0700)   lidocaine 1 mg/min (08/12/22 0700)   PRN Meds: acetaminophen, nitroGLYCERIN   Vital Signs    Vitals:   08/12/22 0500 08/12/22 0600 08/12/22 0700 08/12/22 0754  BP: 108/74 110/68 (!) 132/95   Pulse: (!) 45 (!) 44 (!) 51   Resp: '16 13 10   '$ Temp:    97.7 F (36.5 C)  TempSrc:    Oral  SpO2: 92% 94% 96%   Weight:  (!) 144.8 kg    Height:        Intake/Output Summary (Last 24 hours) at 08/12/2022 0943 Last data filed at 08/12/2022 0700 Gross per 24 hour  Intake 903.2 ml  Output 950 ml  Net -46.8 ml      08/12/2022    6:00 AM 08/11/2022    8:25 PM 08/11/2022    5:42 PM  Last 3 Weights  Weight (lbs) 319 lb 3.6 oz 319 lb 3.6 oz 310 lb  Weight (kg) 144.8 kg 144.8 kg 140.615 kg      Telemetry    SR with rare NSVT and intermittent PVCs - Personally Reviewed  ECG    08/12/22 sinus bradycardia at 49 bpm, RBBB, LAFB, inferior Q - Personally Reviewed  Physical Exam   GEN: No acute distress.   Neck: No JVD Cardiac: RRR, no murmurs, rubs, or gallops.  Respiratory: Clear to auscultation bilaterally. GI: Soft, nontender, non-distended  MS: No edema; No deformity. Neuro:  Nonfocal  Psych: Normal affect   Labs    High Sensitivity Troponin:   Recent Labs  Lab 08/11/22 1755 08/11/22 2030  TROPONINIHS 51* 146*      Chemistry Recent Labs  Lab 08/11/22 1755 08/12/22 0211  NA 139 137  K 4.1 3.1*  CL 102 104  CO2 23 23  GLUCOSE 147* 134*  BUN 25* 26*  CREATININE 1.76* 1.63*  CALCIUM 9.6 8.9  MG 2.2  --   PROT 7.4  --   ALBUMIN 3.8  --   AST 30  --   ALT 17  --   ALKPHOS 87  --   BILITOT 1.3*  --   GFRNONAA 42* 46*  ANIONGAP 14 10    Lipids  Recent Labs  Lab 08/12/22 0211  CHOL 212*  TRIG 104  HDL 46  LDLCALC 145*  CHOLHDL 4.6    Hematology Recent Labs  Lab 08/11/22 1755 08/12/22 0211  WBC 8.1 6.5  RBC 5.97* 5.52  HGB 17.2* 16.3  HCT 52.9* 48.0  MCV 88.6 87.0  MCH 28.8 29.5  MCHC 32.5 34.0  RDW 16.0* 15.6*  PLT 193 184   Thyroid No results for input(s): "TSH", "FREET4" in the last 168 hours.  BNP Recent Labs  Lab 08/11/22 1755  BNP 95.3  DDimer No results for input(s): "DDIMER" in the last 168 hours.   Radiology    DG Chest Portable 1 View  Result Date: 08/11/2022 CLINICAL DATA:  Chest pain EXAM: PORTABLE CHEST 1 VIEW COMPARISON:  02/15/2022 FINDINGS: Cardiac shadow is enlarged but stable. Mild central vascular congestion is seen without focal infiltrate or effusion. No bony abnormality is noted. IMPRESSION: Mild central vascular congestion.  No edema is seen. Electronically Signed   By: Inez Catalina M.D.   On: 08/11/2022 19:06    Cardiac Studies   Cath 05/14/2019 [facility]) Diagnostic Summary  RCA CTO  diffuse mild nonobstructive LAD and CIRC disease  Diagnostic Recommendations  medical therapy for CAD. repeat PCI would be associated with high restenosis  risk, and little LV function improvement since the IP wall is akinetic.   Signatures   Electronically signed by Alinda Money, MD,   [facility] Interventional Physician  on 05-18-2019 15 02    Angiographic Findings   Cardiac Arteries and Lesion Findings  LMCA  Normal.  LAD  Diffuse irregularity.  LCx  Diffuse irregularity.  RCA  Chronic occlusion.    Lesion on Mid RCA  Distal subsection.100% stenosis  25 mm length . Pre    procedure TIMI I flow was noted. Poor run off was present.    Comments diffuse proliferative ISR followed by total occlusion. moderate    collaterals    See full list in admission note  Patient Profile     67 y.o. male with a hx of CAD, type II diabetes, ischemic cardiomyopathy, PVCs who is being seen 08/11/2022 for the evaluation of ventricular tachycardia at the request of Dr. Doren Custard.   Assessment & Plan    CAD Hypercholesterolemia -prior history of multiple PCI, known CTO RCA -lipids 07/27/22 show Tchol 218, TG 67, HDL 48, LDL 156 (goal <55). Not at goal on atorvastatin 80 mg daily -continue aspirin, clopidogrel, atorvastatin -expected hsTn will be elevated, started heparin. HsTn 51->146 -restart imdur if BP remains stable -Ideally would like to cool him off from his VT and then cath him on 3/4. However, if VT cannot be controlled medically, may need to urgently cath. He does not have discomfort when not in VT.   Ischemic cardiomyopathy Ventricular tachycardia History of PVCs -required both amiodarone and lidocaine to stop VT -recent TSH normal -last EF 45-50% -continue carvedilol, was on 25 mg BID as outpatient, start at 6.25 mg dose and titrate up as BP allows -repeat echo -on torsemide '20mg'$  BID as outpatient, hold for now -will check lido level, will drop drip rate if able to maintain control of VT   Chronic kidney disease, stage 3b Hypokalemia -Cr 1.62, GFR 44 on 07/27/22. 1.76 on admission, 1.63 today.  -K 3.1, repleted with 40 IV and 40 PO meq K   Type II diabetes -last A1c 7.3 -started Mounjaro yesterday -on empagliflozin at home -will use SSI, if kidneys remain stable restart empagliflozin  CRITICAL CARE Patient is critically ill with multiple organ systems affected and requires high complexity decision making. Total critical care time: 40 minutes. This time includes gathering of history, evaluation of patient's response to treatment,  examination of patient, review of laboratory and imaging studies, and coordination with consultants. Greater than 50% of time spent in direct patient care.   For questions or updates, please contact [place] Please consult [email or web address] for contact info under        Signed, Buford Dresser, MD  08/12/2022, 9:43 AM

## 2022-08-12 NOTE — Progress Notes (Signed)
ANTICOAGULATION CONSULT NOTE - Follow Up Consult  Pharmacy Consult for heparin Indication:  ACS/CAD  Labs: Recent Labs    08/11/22 1755 08/11/22 2030 08/12/22 0211  HGB 17.2*  --  16.3  HCT 52.9*  --  48.0  PLT 193  --  184  HEPARINUNFRC  --   --  0.40  CREATININE 1.76*  --  1.63*  TROPONINIHS 51* 146*  --     Assessment/Plan:  67yo male therapeutic on heparin with initial dosing for ACS. Will continue infusion at current rate of 1500 units/hr and confirm stable with additional level.   Wynona Neat, PharmD, BCPS  08/12/2022,3:14 AM

## 2022-08-12 NOTE — Progress Notes (Signed)
  Echocardiogram 2D Echocardiogram has been performed.  [name] [name] 08/12/2022, 2:51 PM

## 2022-08-12 NOTE — Progress Notes (Signed)
Rounding Note    Patient Name: [name] [name] Date of Encounter: 08/12/2022  [place] Cardiologist: Sherren Mocha, MD   Subjective   VT has been decreasing since amiodarone/lidocaine started. Tolerating well. No chest pain or shortness of breath. No neuro changes.  Inpatient Medications    Scheduled Meds:  aspirin EC  81 mg Oral Daily   atorvastatin  80 mg Oral Daily   carvedilol  6.25 mg Oral BID WC   clopidogrel  75 mg Oral Daily   insulin aspart  0-15 Units Subcutaneous TID WC   insulin aspart  0-5 Units Subcutaneous QHS   Continuous Infusions:  amiodarone     amiodarone 60 mg/hr (08/12/22 0830)   heparin 1,500 Units/hr (08/12/22 0700)   lidocaine 1 mg/min (08/12/22 0700)   PRN Meds: acetaminophen, nitroGLYCERIN   Vital Signs    Vitals:   08/12/22 0500 08/12/22 0600 08/12/22 0700 08/12/22 0754  BP: 108/74 110/68 (!) 132/95   Pulse: (!) 45 (!) 44 (!) 51   Resp: '16 13 10   '$ Temp:    97.7 F (36.5 C)  TempSrc:    Oral  SpO2: 92% 94% 96%   Weight:  (!) 144.8 kg    Height:        Intake/Output Summary (Last 24 hours) at 08/12/2022 0943 Last data filed at 08/12/2022 0700 Gross per 24 hour  Intake 903.2 ml  Output 950 ml  Net -46.8 ml      08/12/2022    6:00 AM 08/11/2022    8:25 PM 08/11/2022    5:42 PM  Last 3 Weights  Weight (lbs) 319 lb 3.6 oz 319 lb 3.6 oz 310 lb  Weight (kg) 144.8 kg 144.8 kg 140.615 kg      Telemetry    SR with rare NSVT and intermittent PVCs - Personally Reviewed  ECG    08/12/22 sinus bradycardia at 49 bpm, RBBB, LAFB, inferior Q - Personally Reviewed  Physical Exam   GEN: No acute distress.   Neck: No JVD Cardiac: RRR, no murmurs, rubs, or gallops.  Respiratory: Clear to auscultation bilaterally. GI: Soft, nontender, non-distended  MS: No edema; No deformity. Neuro:  Nonfocal  Psych: Normal affect   Labs    High Sensitivity Troponin:   Recent Labs  Lab 08/11/22 1755 08/11/22 2030  TROPONINIHS 51* 146*      Chemistry Recent Labs  Lab 08/11/22 1755 08/12/22 0211  NA 139 137  K 4.1 3.1*  CL 102 104  CO2 23 23  GLUCOSE 147* 134*  BUN 25* 26*  CREATININE 1.76* 1.63*  CALCIUM 9.6 8.9  MG 2.2  --   PROT 7.4  --   ALBUMIN 3.8  --   AST 30  --   ALT 17  --   ALKPHOS 87  --   BILITOT 1.3*  --   GFRNONAA 42* 46*  ANIONGAP 14 10    Lipids  Recent Labs  Lab 08/12/22 0211  CHOL 212*  TRIG 104  HDL 46  LDLCALC 145*  CHOLHDL 4.6    Hematology Recent Labs  Lab 08/11/22 1755 08/12/22 0211  WBC 8.1 6.5  RBC 5.97* 5.52  HGB 17.2* 16.3  HCT 52.9* 48.0  MCV 88.6 87.0  MCH 28.8 29.5  MCHC 32.5 34.0  RDW 16.0* 15.6*  PLT 193 184   Thyroid No results for input(s): "TSH", "FREET4" in the last 168 hours.  BNP Recent Labs  Lab 08/11/22 1755  BNP 95.3  DDimer No results for input(s): "DDIMER" in the last 168 hours.   Radiology    DG Chest Portable 1 View  Result Date: 08/11/2022 CLINICAL DATA:  Chest pain EXAM: PORTABLE CHEST 1 VIEW COMPARISON:  02/15/2022 FINDINGS: Cardiac shadow is enlarged but stable. Mild central vascular congestion is seen without focal infiltrate or effusion. No bony abnormality is noted. IMPRESSION: Mild central vascular congestion.  No edema is seen. Electronically Signed   By: Inez Catalina M.D.   On: 08/11/2022 19:06    Cardiac Studies   Cath 05/14/2019 [facility]) Diagnostic Summary  RCA CTO  diffuse mild nonobstructive LAD and CIRC disease  Diagnostic Recommendations  medical therapy for CAD. repeat PCI would be associated with high restenosis  risk, and little LV function improvement since the IP wall is akinetic.   Signatures   Electronically signed by Alinda Money, MD,   [facility] Interventional Physician  on 05-18-2019 15 02    Angiographic Findings   Cardiac Arteries and Lesion Findings  LMCA  Normal.  LAD  Diffuse irregularity.  LCx  Diffuse irregularity.  RCA  Chronic occlusion.    Lesion on Mid RCA  Distal subsection.100% stenosis  25 mm length . Pre    procedure TIMI I flow was noted. Poor run off was present.    Comments diffuse proliferative ISR followed by total occlusion. moderate    collaterals    See full list in admission note  Patient Profile     67 y.o. male with a hx of CAD, type II diabetes, ischemic cardiomyopathy, PVCs who is being seen 08/11/2022 for the evaluation of ventricular tachycardia at the request of Dr. Doren Custard.   Assessment & Plan    CAD Hypercholesterolemia -prior history of multiple PCI, known CTO RCA -lipids 07/27/22 show Tchol 218, TG 67, HDL 48, LDL 156 (goal <55). Not at goal on atorvastatin 80 mg daily -continue aspirin, clopidogrel, atorvastatin -expected hsTn will be elevated, started heparin. HsTn 51->146 -restart imdur if BP remains stable -Ideally would like to cool him off from his VT and then cath him on 3/4. However, if VT cannot be controlled medically, may need to urgently cath. He does not have discomfort when not in VT.   Ischemic cardiomyopathy Ventricular tachycardia History of PVCs -required both amiodarone and lidocaine to stop VT -recent TSH normal -last EF 45-50% -continue carvedilol, was on 25 mg BID as outpatient, start at 6.25 mg dose and titrate up as BP allows -repeat echo -on torsemide '20mg'$  BID as outpatient, hold for now -will check lido level, will drop drip rate if able to maintain control of VT   Chronic kidney disease, stage 3b Hypokalemia -Cr 1.62, GFR 44 on 07/27/22. 1.76 on admission, 1.63 today.  -K 3.1, repleted with 40 IV and 40 PO meq K   Type II diabetes -last A1c 7.3 -started Mounjaro yesterday -on empagliflozin at home -will use SSI, if kidneys remain stable restart empagliflozin  CRITICAL CARE Patient is critically ill with multiple organ systems affected and requires high complexity decision making. Total critical care time: 40 minutes. This time includes gathering of history, evaluation of patient's response to treatment,  examination of patient, review of laboratory and imaging studies, and coordination with consultants. Greater than 50% of time spent in direct patient care.   For questions or updates, please contact [place] Please consult [email or web address] for contact info under        Signed, Buford Dresser, MD  08/12/2022, 9:43 AM

## 2022-08-13 ENCOUNTER — Encounter (HOSPITAL_COMMUNITY)
Admission: EM | Disposition: A | Discharge status: Home / Self Care | Payer: Self-pay | Source: Home / Self Care | Attending: Cardiovascular Disease

## 2022-08-13 ENCOUNTER — Institutional Professional Consult (permissible substitution): Payer: 59 | Admitting: Nurse Practitioner

## 2022-08-13 ENCOUNTER — Encounter (HOSPITAL_COMMUNITY): Payer: Self-pay | Admitting: Internal Medicine

## 2022-08-13 DIAGNOSIS — I2510 Atherosclerotic heart disease of native coronary artery without angina pectoris: Secondary | ICD-10-CM

## 2022-08-13 DIAGNOSIS — G4733 Obstructive sleep apnea (adult) (pediatric): Secondary | ICD-10-CM

## 2022-08-13 DIAGNOSIS — I214 Non-ST elevation (NSTEMI) myocardial infarction: Secondary | ICD-10-CM

## 2022-08-13 DIAGNOSIS — N1831 Chronic kidney disease, stage 3a: Secondary | ICD-10-CM | POA: Diagnosis not present

## 2022-08-13 DIAGNOSIS — E876 Hypokalemia: Secondary | ICD-10-CM

## 2022-08-13 DIAGNOSIS — E118 Type 2 diabetes mellitus with unspecified complications: Secondary | ICD-10-CM

## 2022-08-13 HISTORY — PX: LEFT HEART CATH AND CORONARY ANGIOGRAPHY: CATH118249

## 2022-08-13 LAB — BASIC METABOLIC PANEL
Anion gap: 11 (ref 5–15)
BUN: 23 mg/dL (ref 8–23)
CO2: 24 mmol/L (ref 22–32)
Calcium: 9.1 mg/dL (ref 8.9–10.3)
Chloride: 100 mmol/L (ref 98–111)
Creatinine, Ser: 1.5 mg/dL — ABNORMAL HIGH (ref 0.61–1.24)
GFR, Estimated: 51 mL/min — ABNORMAL LOW (ref >60)
Glucose, Bld: 328 mg/dL — ABNORMAL HIGH (ref 70–99)
Potassium: 3.4 mmol/L — ABNORMAL LOW (ref 3.5–5.1)
Sodium: 135 mmol/L (ref 135–145)

## 2022-08-13 LAB — GLUCOSE, CAPILLARY
Glucose-Capillary: 122 mg/dL — ABNORMAL HIGH (ref 70–99)
Glucose-Capillary: 107 mg/dL — ABNORMAL HIGH (ref 70–99)
Glucose-Capillary: 115 mg/dL — ABNORMAL HIGH (ref 70–99)
Glucose-Capillary: 123 mg/dL — ABNORMAL HIGH (ref 70–99)

## 2022-08-13 LAB — HEPARIN LEVEL (UNFRACTIONATED): Heparin Unfractionated: 0.48 IU/mL (ref 0.30–0.70)

## 2022-08-13 LAB — LIDOCAINE LEVEL
Lidocaine Lvl: 34.4 ug/mL — ABNORMAL HIGH (ref 1.5–5.0)
Lidocaine Lvl: 1.2 ug/mL — ABNORMAL LOW (ref 1.5–5.0)
Lidocaine Lvl: >48 ug/mL (ref 1.5–5.0)

## 2022-08-13 SURGERY — LEFT HEART CATH AND CORONARY ANGIOGRAPHY
Anesthesia: LOCAL

## 2022-08-13 MED ORDER — SODIUM CHLORIDE 0.9% FLUSH: 3.0000 mL | INTRAVENOUS | Status: DC | PRN

## 2022-08-13 MED ORDER — ENOXAPARIN SODIUM 40 MG/0.4ML IJ SOSY: 40.0000 mg | PREFILLED_SYRINGE | INTRAMUSCULAR | Status: DC

## 2022-08-13 MED ORDER — AMLODIPINE BESYLATE 5 MG PO TABS
5.0000 mg | ORAL_TABLET | Freq: Every day | ORAL | Status: DC
  Administered 2022-08-13 – 2022-08-16 (×4): 5 mg via ORAL
  Filled 2022-08-13 (×4): qty 1

## 2022-08-13 MED ORDER — MIDAZOLAM HCL 2 MG/2ML IJ SOLN
INTRAMUSCULAR | Status: DC | PRN
  Administered 2022-08-13: 1 mg via INTRAVENOUS

## 2022-08-13 MED ORDER — IOHEXOL 350 MG/ML SOLN
INTRAVENOUS | Status: DC | PRN
  Administered 2022-08-13: 40 mL

## 2022-08-13 MED ORDER — SODIUM CHLORIDE 0.9% FLUSH
3.0000 mL | Freq: Two times a day (BID) | INTRAVENOUS | Status: DC
  Administered 2022-08-13 – 2022-08-17 (×8): 3 mL via INTRAVENOUS

## 2022-08-13 MED ORDER — FENTANYL CITRATE (PF) 100 MCG/2ML IJ SOLN
INTRAMUSCULAR | Status: DC | PRN
  Administered 2022-08-13: 25 ug via INTRAVENOUS

## 2022-08-13 MED ORDER — EMPAGLIFLOZIN 10 MG PO TABS
10.0000 mg | ORAL_TABLET | Freq: Every day | ORAL | Status: DC
  Administered 2022-08-13: 10 mg via ORAL
  Filled 2022-08-13 (×2): qty 1

## 2022-08-13 MED ORDER — SODIUM CHLORIDE 0.9 % IV SOLN: 250.0000 mL | INTRAVENOUS | Status: DC | PRN

## 2022-08-13 MED ORDER — HYDRALAZINE HCL 20 MG/ML IJ SOLN: 10.0000 mg | INTRAMUSCULAR | Status: AC | PRN

## 2022-08-13 MED ORDER — HEPARIN SODIUM (PORCINE) 1000 UNIT/ML IJ SOLN
INTRAMUSCULAR | Status: DC | PRN
  Administered 2022-08-13: 5000 [IU] via INTRAVENOUS

## 2022-08-13 MED ORDER — VERAPAMIL HCL 2.5 MG/ML IV SOLN
INTRAVENOUS | Status: AC
  Filled 2022-08-13: qty 2

## 2022-08-13 MED ORDER — MEXILETINE HCL 250 MG PO CAPS
250.0000 mg | ORAL_CAPSULE | Freq: Two times a day (BID) | ORAL | Status: DC
  Administered 2022-08-13 – 2022-08-18 (×10): 250 mg via ORAL
  Filled 2022-08-13 (×11): qty 1

## 2022-08-13 MED ORDER — MIDAZOLAM HCL 2 MG/2ML IJ SOLN
INTRAMUSCULAR | Status: AC
  Filled 2022-08-13: qty 2

## 2022-08-13 MED ORDER — LIDOCAINE HCL (PF) 1 % IJ SOLN
INTRAMUSCULAR | Status: AC
  Filled 2022-08-13: qty 30

## 2022-08-13 MED ORDER — POTASSIUM CHLORIDE CRYS ER 20 MEQ PO TBCR
40.0000 meq | EXTENDED_RELEASE_TABLET | Freq: Two times a day (BID) | ORAL | Status: AC
  Administered 2022-08-13 (×2): 40 meq via ORAL
  Filled 2022-08-13 (×2): qty 2

## 2022-08-13 MED ORDER — LIDOCAINE HCL (PF) 1 % IJ SOLN
INTRAMUSCULAR | Status: DC | PRN
  Administered 2022-08-13: 2 mL

## 2022-08-13 MED ORDER — HEPARIN (PORCINE) IN NACL 1000-0.9 UT/500ML-% IV SOLN
INTRAVENOUS | Status: DC | PRN
  Administered 2022-08-13 (×2): 500 mL

## 2022-08-13 MED ORDER — ISOSORB DINITRATE-HYDRALAZINE 20-37.5 MG PO TABS
1.0000 | ORAL_TABLET | Freq: Two times a day (BID) | ORAL | Status: DC
  Administered 2022-08-13 (×3): 1 via ORAL
  Filled 2022-08-13 (×5): qty 1

## 2022-08-13 MED ORDER — FENTANYL CITRATE (PF) 100 MCG/2ML IJ SOLN
INTRAMUSCULAR | Status: AC
  Filled 2022-08-13: qty 2

## 2022-08-13 MED ORDER — LABETALOL HCL 5 MG/ML IV SOLN: 10.0000 mg | INTRAVENOUS | Status: AC | PRN

## 2022-08-13 MED ORDER — ALPRAZOLAM 0.25 MG PO TABS
0.2500 mg | ORAL_TABLET | Freq: Once | ORAL | Status: AC
  Administered 2022-08-13: 0.25 mg via ORAL
  Filled 2022-08-13: qty 1

## 2022-08-13 MED ORDER — VERAPAMIL HCL 2.5 MG/ML IV SOLN
INTRAVENOUS | Status: DC | PRN
  Administered 2022-08-13 (×2): 10 mL via INTRA_ARTERIAL

## 2022-08-13 MED ORDER — MEXILETINE HCL 250 MG PO CAPS
250.0000 mg | ORAL_CAPSULE | Freq: Two times a day (BID) | ORAL | Status: DC
  Filled 2022-08-13: qty 1

## 2022-08-13 SURGICAL SUPPLY — 13 items
CATH OPTITORQUE TIG 4.0 5F (CATHETERS) IMPLANT
DEVICE RAD COMP TR BAND LRG (VASCULAR PRODUCTS) IMPLANT
ELECT DEFIB PAD ADLT CADENCE (PAD) IMPLANT
GLIDESHEATH SLEND SS 6F .021 (SHEATH) IMPLANT
GUIDEWIRE INQWIRE 1.5J.035X260 (WIRE) IMPLANT
INQWIRE 1.5J .035X260CM (WIRE) ×1
KIT HEART LEFT (KITS) ×1 IMPLANT
MAT PREVALON FULL STRYKER (MISCELLANEOUS) IMPLANT
PACK CARDIAC CATHETERIZATION (CUSTOM PROCEDURE TRAY) ×1 IMPLANT
SHEATH 6FR 75 DEST SLENDER (SHEATH) IMPLANT
SHEATH PROBE COVER 6X72 (BAG) IMPLANT
TRANSDUCER W/STOPCOCK (MISCELLANEOUS) ×1 IMPLANT
TUBING CIL FLEX 10 FLL-RA (TUBING) ×1 IMPLANT

## 2022-08-13 NOTE — Progress Notes (Addendum)
Rounding Note    Patient Name: [name] [name] Date of Encounter: 08/13/2022  [place] Cardiologist: Sherren Mocha, MD   Subjective   No chest pain  Inpatient Medications    Scheduled Meds:  aspirin EC  81 mg Oral Daily   atorvastatin  80 mg Oral Daily   Chlorhexidine Gluconate Cloth  6 each Topical Daily   clopidogrel  75 mg Oral Daily   [START ON 08/14/2022] enoxaparin (LOVENOX) injection  40 mg Subcutaneous Q24H   insulin aspart  0-15 Units Subcutaneous TID WC   insulin aspart  0-5 Units Subcutaneous QHS   isosorbide-hydrALAZINE  1 tablet Oral BID   potassium chloride  40 mEq Oral BID   sodium chloride flush  3 mL Intravenous Q12H   sodium chloride flush  3 mL Intravenous Q12H   Continuous Infusions:  sodium chloride     amiodarone 30 mg/hr (08/13/22 1518)   amiodarone Stopped (08/13/22 1517)   lidocaine Stopped (08/13/22 0900)   PRN Meds: sodium chloride, acetaminophen, hydrALAZINE, labetalol, nitroGLYCERIN, sodium chloride flush   Vital Signs    Vitals:   08/13/22 1002 08/13/22 1007 08/13/22 1012 08/13/22 1109  BP: (!) 151/93 (!) 156/91 (!) 152/98   Pulse: 67 (!) 58 60   Resp: (!) 25 (!) 23 20   Temp:    98.4 F (36.9 C)  TempSrc:    Oral  SpO2: 94% 95% 91%   Weight:      Height:        Intake/Output Summary (Last 24 hours) at 08/13/2022 1527 Last data filed at 08/13/2022 1412 Gross per 24 hour  Intake 1193.78 ml  Output 1600 ml  Net -406.22 ml      08/13/2022    6:00 AM 08/12/2022    6:00 AM 08/11/2022    8:25 PM  Last 3 Weights  Weight (lbs) 319 lb 10.7 oz 319 lb 3.6 oz 319 lb 3.6 oz  Weight (kg) 145 kg 144.8 kg 144.8 kg      Telemetry    Sinus bradycardia at 52 - Personally Reviewed  ECG    08/13/2022 ECG (independently read by me): Sinus bradycardia at 52, RBBB, LAHB, Q III, QTc 509 msec  Physical Exam   BP (!) 152/98   Pulse 60   Temp 98.4 F (36.9 C) (Oral)   Resp 20   Ht '6\' 1"'$  (1.854 m)   Wt (!) 145 kg   SpO2 91%    BMI 42.17 kg/m  General: Alert, oriented, no distress.  Morbid obesity Skin: normal turgor, no rashes, warm and dry HEENT: Normocephalic, atraumatic. Pupils equal round and reactive to light; sclera anicteric; extraocular muscles intact; Nose without nasal septal hypertrophy Mouth/Parynx; large tongue ; Mallinpatti scale 4 Neck: Thick neck; No JVD, no carotid bruits; normal carotid upstroke Lungs: clear to ausculatation and percussion; no wheezing or rales Chest wall: without tenderness to palpitation Heart: PMI not displaced, RRR, s1 s2 normal, 1/6 systolic murmur, no diastolic murmur, no rubs, gallops, thrills, or heaves Abdomen: central adiposity; soft, nontender; no hepatosplenomehaly, BS+; abdominal aorta nontender and not dilated by palpation. Back: no CVA tenderness Pulses 2+ right radial cath site stable; no ecchymosis Musculoskeletal: full range of motion, normal strength, no joint deformities Extremities: no clubbing cyanosis or edema, Homan's sign negative  Neurologic: grossly nonfocal; Cranial nerves grossly wnl Psychologic: Normal mood and affect    Labs    High Sensitivity Troponin:   Recent Labs  Lab 08/11/22 1755 08/11/22 2030  TROPONINIHS  51* 146*     Chemistry Recent Labs  Lab 08/11/22 1755 08/12/22 0211 08/12/22 1048 08/13/22 0535  NA 139 137  --  135  K 4.1 3.1* 3.6 3.4*  CL 102 104  --  100  CO2 23 23  --  24  GLUCOSE 147* 134*  --  328*  BUN 25* 26*  --  23  CREATININE 1.76* 1.63*  --  1.50*  CALCIUM 9.6 8.9  --  9.1  MG 2.2  --   --   --   PROT 7.4  --   --   --   ALBUMIN 3.8  --   --   --   AST 30  --   --   --   ALT 17  --   --   --   ALKPHOS 87  --   --   --   BILITOT 1.3*  --   --   --   GFRNONAA 42* 46*  --  51*  ANIONGAP 14 10  --  11    Lipids  Recent Labs  Lab 08/12/22 0211  CHOL 212*  TRIG 104  HDL 46  LDLCALC 145*  CHOLHDL 4.6    Hematology Recent Labs  Lab 08/11/22 1755 08/12/22 0211  WBC 8.1 6.5  RBC 5.97* 5.52   HGB 17.2* 16.3  HCT 52.9* 48.0  MCV 88.6 87.0  MCH 28.8 29.5  MCHC 32.5 34.0  RDW 16.0* 15.6*  PLT 193 184   Thyroid No results for input(s): "TSH", "FREET4" in the last 168 hours.  BNP Recent Labs  Lab 08/11/22 1755  BNP 95.3    DDimer No results for input(s): "DDIMER" in the last 168 hours.   Radiology    CARDIAC CATHETERIZATION  Result Date: 08/13/2022 Conclusions: Multivessel coronary artery disease, including 99% in-stent restenosis of mid RCA stent with occlusion of distal RCA (may be due to competitive flow from left-to-right collaterals).  There is also 90% stenosis of the apical LAD and mild-moderate disease involving the more proximal LAD and LCx. Mildly elevated left ventricular filling pressure (LVEDP 20 mmHg). Tortuous right subclavian/brachiocephalic artery limiting catheter manipulation.  Consider alternate approach for future catheterizations. Recommendations: Severe RCA disease is unchanged since 2020 based on cath report from [place].  Question if VT is related to scar from prior inferior MI. Continue medical therapy and aggressive secondary prevention.  Apical LAD stenosis is too distal for percutaneous intervention. Follow-up EP recommendations regarding management of sustained VT. [name] Bush, MD [facility]  ECHOCARDIOGRAM COMPLETE  Result Date: 08/12/2022    ECHOCARDIOGRAM REPORT   Patient Name:   [name] [name] Date of Exam: 08/12/2022 Medical Rec #:  [id number]      Height:       73.0 in Accession #:    [id number]     Weight:       319.2 lb Date of Birth:  November 25, 1955       BSA:          2.625 m Patient Age:    67 years       BP:           134/85 mmHg Patient Gender: M              HR:           53 bpm. Exam Location:  Inpatient Procedure: 2D Echo, Cardiac Doppler and Color Doppler Indications:    Ventricular Tachycardia  History:  Patient has prior history of Echocardiogram examinations, most                 recent 02/16/2022. Cardiomyopathy, CAD, chronic  kidney disease,                 Arrythmias:PVC, Signs/Symptoms:Shortness of Breath; Risk                 Factors:Hypertension, Diabetes, Dyslipidemia, Sleep Apnea and                 Former Smoker.  Sonographer:    Johny Chess RDCS Referring Phys: [id number] Sipsey CHRISTOPHER  Sonographer Comments: Patient is obese. Image acquisition challenging due to patient body habitus. IMPRESSIONS  1. Left ventricular ejection fraction, by estimation, is 30 to 35%. The left ventricle has moderately decreased function. The left ventricle demonstrates regional wall motion abnormalities (see scoring diagram/findings for description). There is mild left ventricular hypertrophy. Left ventricular diastolic parameters are consistent with Grade II diastolic dysfunction (pseudonormalization). Elevated left atrial pressure.  2. Right ventricular systolic function is normal. The right ventricular size is normal. There is mildly elevated pulmonary artery systolic pressure. The estimated right ventricular systolic pressure is 0000000 mmHg.  3. Left atrial size was mildly dilated.  4. The mitral valve is normal in structure. Trivial mitral valve regurgitation. No evidence of mitral stenosis.  5. The aortic valve is tricuspid. Aortic valve regurgitation is not visualized. No aortic stenosis is present.  6. The inferior vena cava is dilated in size with >50% respiratory variability, suggesting right atrial pressure of 8 mmHg. FINDINGS  Left Ventricle: Left ventricular ejection fraction, by estimation, is 30 to 35%. The left ventricle has moderately decreased function. The left ventricle demonstrates regional wall motion abnormalities. The left ventricular internal cavity size was normal in size. There is mild left ventricular hypertrophy. Left ventricular diastolic parameters are consistent with Grade II diastolic dysfunction (pseudonormalization). Elevated left atrial pressure.  LV Wall Scoring: The mid and distal lateral wall, posterior  wall, mid anterolateral segment, and basal inferior segment are akinetic. The mid and distal inferior wall, basal anterolateral segment, and basal inferoseptal segment are hypokinetic. The entire anterior wall, entire anterior septum, mid inferoseptal segment, and apex are normal. Right Ventricle: The right ventricular size is normal. No increase in right ventricular wall thickness. Right ventricular systolic function is normal. There is mildly elevated pulmonary artery systolic pressure. The tricuspid regurgitant velocity is 2.84  m/s, and with an assumed right atrial pressure of 8 mmHg, the estimated right ventricular systolic pressure is 0000000 mmHg. Left Atrium: Left atrial size was mildly dilated. Right Atrium: Right atrial size was normal in size. Pericardium: There is no evidence of pericardial effusion. Mitral Valve: The mitral valve is normal in structure. Trivial mitral valve regurgitation. No evidence of mitral valve stenosis. Tricuspid Valve: The tricuspid valve is normal in structure. Tricuspid valve regurgitation is mild. Aortic Valve: The aortic valve is tricuspid. Aortic valve regurgitation is not visualized. No aortic stenosis is present. Pulmonic Valve: The pulmonic valve was not well visualized. Pulmonic valve regurgitation is not visualized. Aorta: The aortic root and ascending aorta are structurally normal, with no evidence of dilitation. Venous: The inferior vena cava is dilated in size with greater than 50% respiratory variability, suggesting right atrial pressure of 8 mmHg. IAS/Shunts: The interatrial septum was not well visualized.  LEFT VENTRICLE PLAX 2D LVIDd:         5.60 cm      Diastology LVIDs:  5.00 cm      LV e' medial:    5.87 cm/s LV PW:         1.20 cm      LV E/e' medial:  14.1 LV IVS:        1.10 cm      LV e' lateral:   5.66 cm/s LVOT diam:     2.20 cm      LV E/e' lateral: 14.6 LV SV:         77 LV SV Index:   29 LVOT Area:     3.80 cm  LV Volumes (MOD) LV vol d, MOD  A2C: 135.0 ml LV vol d, MOD A4C: 188.0 ml LV vol s, MOD A2C: 100.0 ml LV vol s, MOD A4C: 113.0 ml LV SV MOD A2C:     35.0 ml LV SV MOD A4C:     188.0 ml LV SV MOD BP:      53.0 ml RIGHT VENTRICLE             IVC RV Basal diam:  3.70 cm     IVC diam: 2.40 cm RV S prime:     10.10 cm/s TAPSE (M-mode): 1.9 cm LEFT ATRIUM              Index        RIGHT ATRIUM           Index LA diam:        4.50 cm  1.71 cm/m   RA Area:     23.60 cm LA Vol (A2C):   116.0 ml 44.20 ml/m  RA Volume:   74.90 ml  28.54 ml/m LA Vol (A4C):   72.8 ml  27.74 ml/m LA Biplane Vol: 93.2 ml  35.51 ml/m  AORTIC VALVE LVOT Vmax:   103.00 cm/s LVOT Vmean:  62.600 cm/s LVOT VTI:    0.203 m  AORTA Ao Root diam: 3.40 cm Ao Asc diam:  3.70 cm MITRAL VALVE               TRICUSPID VALVE MV Area (PHT): 3.68 cm    TV Peak grad:   29.8 mmHg MV Decel Time: 206 msec    TV Vmax:        2.73 m/s MV E velocity: 82.70 cm/s  TR Peak grad:   32.3 mmHg MV A velocity: 44.80 cm/s  TR Vmax:        284.00 cm/s MV E/A ratio:  1.85                            SHUNTS                            Systemic VTI:  0.20 m                            Systemic Diam: 2.20 cm Oswaldo Milian MD Electronically signed by Oswaldo Milian MD Signature Date/Time: 08/12/2022/3:37:32 PM    Final    DG Chest Portable 1 View  Result Date: 08/11/2022 CLINICAL DATA:  Chest pain EXAM: PORTABLE CHEST 1 VIEW COMPARISON:  02/15/2022 FINDINGS: Cardiac shadow is enlarged but stable. Mild central vascular congestion is seen without focal infiltrate or effusion. No bony abnormality is noted. IMPRESSION: Mild central vascular congestion.  No edema is seen. Electronically Signed   By: Inez Catalina  M.D.   On: 08/11/2022 19:06    Cardiac Studies    ECHO: 08/11/2022  1. Left ventricular ejection fraction, by estimation, is 30 to 35%. The  left ventricle has moderately decreased function. The left ventricle  demonstrates regional wall motion abnormalities (see scoring  diagram/findings  for description). There is mild  left ventricular hypertrophy. Left ventricular diastolic parameters are  consistent with Grade II diastolic dysfunction (pseudonormalization).  Elevated left atrial pressure.   2. Right ventricular systolic function is normal. The right ventricular  size is normal. There is mildly elevated pulmonary artery systolic  pressure. The estimated right ventricular systolic pressure is 0000000 mmHg.   3. Left atrial size was mildly dilated.   4. The mitral valve is normal in structure. Trivial mitral valve  regurgitation. No evidence of mitral stenosis.   5. The aortic valve is tricuspid. Aortic valve regurgitation is not  visualized. No aortic stenosis is present.   6. The inferior vena cava is dilated in size with >50% respiratory  variability, suggesting right atrial pressure of 8 mmHg.   FINDINGS   Left Ventricle: Left ventricular ejection fraction, by estimation, is 30  to 35%. The left ventricle has moderately decreased function. The left  ventricle demonstrates regional wall motion abnormalities. The left  ventricular internal cavity size was  normal in size. There is mild left ventricular hypertrophy. Left  ventricular diastolic parameters are consistent with Grade II diastolic  dysfunction (pseudonormalization). Elevated left atrial pressure.     LV Wall Scoring:  The mid and distal lateral wall, posterior wall, mid anterolateral  segment,  and basal inferior segment are akinetic. The mid and distal inferior wall,  basal anterolateral segment, and basal inferoseptal segment are  hypokinetic.  The entire anterior wall, entire anterior septum, mid inferoseptal  segment,  and apex are normal.     CARDIAC CATH: 08/12/2022 Conclusions: Multivessel coronary artery disease, including 99% in-stent restenosis of mid RCA stent with occlusion of distal RCA (may be due to competitive flow from left-to-right collaterals).  There is also 90% stenosis of the  apical LAD and mild-moderate disease involving the more proximal LAD and LCx. Mildly elevated left ventricular filling pressure (LVEDP 20 mmHg). Tortuous right subclavian/brachiocephalic artery limiting catheter manipulation.  Consider alternate approach for future catheterizations.   Recommendations: Severe RCA disease is unchanged since 2020 based on cath report from [place].  Question if VT is related to scar from prior inferior MI. Continue medical therapy and aggressive secondary prevention.  Apical LAD stenosis is too distal for percutaneous intervention. Follow-up EP recommendations regarding management of sustained VT.     Patient Profile     67 y.o. male 67 y.o. male with a hx of CAD, type II diabetes, ischemic cardiomyopathy, PVCs who is being seen 08/11/2022 for the evaluation of ventricular tachycardia at the request of Dr. Doren Custard.    Assessment & Plan    CAD: Patient underwent cardiac catheterization this morning.  I have personally reviewed the angiographic images which demonstrate severe RCA disease, unchanged from 2020 with patent proximal LAD stent, 90% apical LAD stenosis, and moderate stenosis in the left circumflex vessel.  Plan for medical therapy for his underlying CAD.  He is on DAPT with aspirin/Plavix. Ventricular tachycardia: No recurrence with patient now on amiodarone and lidocaine.  Suspect possible scar mediated VT. Plan for and for ICD prior to discharge.  Now on reduced infusion of amiodarone at 30 mg/h.  Heart rate 52. Ischemic cardiomyopathy.  EF 30 to 35%  on echo with grade 2 diastolic dysfunction, and estimated RV pressure 40.3 mmHg.  Will initiate guideline directed medical therapy.  He is on isosorbide/hydralazine 20/37.5 mg twice daily.  He is on amiodarone which may be playing a role in low heart rate.  Heart rate currently 50-52, as result we will not start metoprolol succinate presently.  Will add Jardiance 10 mg, At present, with renal insufficiency will  avoid Entresto.  Consider future addition of spironolactone. AKI: Initial creatinine 1.76 on admission.  Improved this morning prior to cath at 1.50. Hypokalemia: Initially 3.1, repeat today 3.4; repeat to 4.0 Hyperlipidemia with target less than 55.  LDL cholesterol 145.  Now on atorvastatin 80 mg.  Add Zetia if unable to reach target and possible PCSK9 inhibition. Type 2 diabetes mellitus: Metformin initiated in 2014. Obstructive sleep apnea: Patient remotely was diagnosed in 2012, his machine was taken away due to noncompliance.  I discussed with him today the potential adverse cardiovascular consequences of untreated sleep apnea with reference to its effect on hypertension, nocturnal arrhythmias, increased risk for atrial fibrillation, negative effects with reference to insulin resistance, increased inflammatory markers, nocturnal GERD and potential for nocturnal hypoxemia contributing to nocturnal ischemia.  I discussed the importance of reinitiating sleep evaluation postdischarge. 9.  Obesity: BMI 32.2.  Importance of weight loss.   For questions or updates, please contact [place] Please consult [email or web address] for contact info under   Time spent : 40 minutes. Signed, Shelva Majestic, MD  08/13/2022, 3:27 PM

## 2022-08-13 NOTE — Progress Notes (Signed)
   Heart Failure Stewardship Pharmacist Progress Note   PCP: Biagio Borg, MD PCP-Cardiologist: Sherren Mocha, MD    HPI:  67 yo M with PMH of CAD, CHF, HTN, HLD, SVT, T2DM, OSA, CVA, asthma, and GERD.  Presented to the ED on 3/2 with chest pain and palpitations. On EMS arrival, patient was in VT. He received 150 mg bolus of amiodarone prior to arrival. Started on amiodarone drip and then had sustained VT. He was rebolused on amiodarone and started on a lidocaine drip. An ECHO was done on 3/3 and LVEF was reduced to 30-35% (was 45-50% in 2023)  with regional wall motion abnormalities, mild LVH, and RV normal. Taken for cath on 3/4 and multivessel CAD is unchanged from 2020. LVEDP 20. EP following and plan ICD before discharge.  Current HF Medications: Other: BiDil 1 tab BID  Prior to admission HF Medications: Diuretic: torsemide 20 mg daily Beta blocker: carvedilol 25 mg BID SGLT2i: Jardiance 25 mg daily Other: Imdur 30 mg daily   Pertinent Lab Values: Serum creatinine 1.50, BUN 23, Potassium 3.4, Sodium 135, BNP 95.3, Magnesium 2.2, A1c 7.3   Vital Signs: Weight: 319 lbs (admission weight: 319 lbs) Blood pressure: 130-150/90s  Heart rate: 50s  I/O: +0.6L yesterday; net +0.2L  Medication Assistance / Insurance Benefits Check: Does the patient have prescription insurance?  Yes Type of insurance plan: Medicare + Medicaid  Outpatient Pharmacy:  Prior to admission outpatient pharmacy: [facility] Is the patient willing to use [facility] at discharge? Yes Is the patient willing to transition their outpatient pharmacy to utilize a [facility] outpatient pharmacy?   No    Assessment: 1. Acute on chronic systolic CHF (LVEF 99991111), due to ICM. NYHA class II symptoms. - Did not receive any doses of carvedilol, HR 50s - Continue BiDil 1 tab BID - may need to increase to TID - Consider resuming Jardiance 25 mg daily, creatinine improved.   Plan: 1) Medication changes  recommended at this time: - Restart Jardiance 25 mg daily  2) Patient assistance: - None pending  3)  Education  - To be completed prior to discharge  Kerby Nora, PharmD, BCPS Heart Failure Stewardship Pharmacist Phone [phone]

## 2022-08-13 NOTE — Progress Notes (Signed)
EP  Telemetry reviewed - SR in 40s-60s with rare PVCs  EP will follow course for ICD timing

## 2022-08-13 NOTE — TOC Initial Note (Signed)
Transition of Care [facility]) - Initial/Assessment Note    Patient Details  Name: [name] [name] MRN: [id number] Date of Birth: 1956/05/05  Transition of Care [facility] CM/SW Contact:    [name] Rasher, RN Phone Number: [phone] 08/13/2022, 4:39 PM  Clinical Narrative:                  Spoke to pt and states he lives alone. Was independent PTA. Drives to his appts. Will continue to follow for dc needs.    Expected Discharge Plan: Home/Self Care Barriers to Discharge: Continued Medical Work up   Patient Goals and CMS Choice Patient states their goals for this hospitalization and ongoing recovery are:: wants to recover          Expected Discharge Plan and Services   Discharge Planning Services: CM Consult                                          Prior Living Arrangements/Services   Lives with:: Self Patient language and need for interpreter reviewed:: Yes Do you feel safe going back to the place where you live?: Yes      Need for Family Participation in Patient Care: No (Comment) Care giver support system in place?: No (comment)   Criminal Activity/Legal Involvement Pertinent to Current Situation/Hospitalization: No - Comment as needed  Activities of Daily Living      Permission Sought/Granted Permission sought to share information with : Case Manager, PCP Permission granted to share information with : Yes, Verbal Permission Granted  Share Information with NAME: [name] [name]     Permission granted to share info w Relationship: friend  Permission granted to share info w Contact Information: [phone]  Emotional Assessment Appearance:: Appears stated age Attitude/Demeanor/Rapport: Engaged Affect (typically observed): Accepting Orientation: : Oriented to Self, Oriented to Place, Oriented to  Time, Oriented to Situation   Psych Involvement: No (comment)  Admission diagnosis:  Ventricular tachycardia ([place]) [I47.20] Patient Active Problem List    Diagnosis Date Noted   Ventricular tachycardia ([place]) 08/11/2022   Rash 02/25/2022   Hypotension 02/16/2022   Near syncope 02/15/2022   Hearing loss of left ear due to cerumen impaction 12/15/2021   Asthma 09/17/2021   PVC's (premature ventricular contractions) 09/03/2021   SVT (supraventricular tachycardia) 09/03/2021   History of TIA (transient ischemic attack) 02/05/2021   History of stroke 01/2021   Aortic atherosclerosis ([place]) 12/26/2020   Cough 12/26/2020   Vitamin D deficiency 04/03/2020   B12 deficiency 04/03/2020   Itching 11/22/2019   Chronic heart failure with preserved ejection fraction (HFpEF) ([place]) 05/13/2019   Elevated troponin 05/13/2019   Morbid obesity with BMI of 40.0-44.9, adult ([place]) 05/13/2019   Pre-ulcerative calluses 02/03/2019   Foot injury, left, initial encounter 11/26/2018   Chest pain 05/30/2017   Hypokalemia 05/30/2017   Allergic rhinitis 12/18/2016   Lumbar radiculitis 09/05/2016   Lumbar spinal stenosis 09/05/2016   CKD (chronic kidney disease), stage III ([place]) 12/29/2015   Chest pain syndrome 12/15/2015   Acute renal failure superimposed on stage 3a chronic kidney disease ([place]) 12/15/2015   Atypical chest pain 12/15/2015   Encounter for well adult exam with abnormal findings 06/27/2015   Chronic low back pain 06/27/2015   Left lumbar radiculopathy 04/13/2015   Bilateral plantar fasciitis 04/13/2015   Essential hypertension 03/09/2015   OSA (obstructive sleep apnea) 03/27/2014   Peripheral neuropathy ([place])  12/29/2013   External hemorrhoid, thrombosed 12/03/2013   Sinus bradycardia 11/15/2013   Coronary artery disease involving native coronary artery of native heart without angina pectoris    Hyperlipidemia    DM2 (diabetes mellitus, type 2) ([place]) 06/26/2013   Gait disorder 06/26/2013   Obesity, Class III, BMI 40-49.9 (morbid obesity) ([place]) 09/10/2012   Former smoker 09/02/2012   Ischemic cardiomyopathy- EF 50-55% June 2015    Non-ST  elevation (NSTEMI) myocardial infarction ([place]) 09/01/2012   HEMORRHOIDS 12/26/2009   Acute prostatitis 12/26/2009   GANGLION CYST, WRIST, RIGHT 11/28/2009   COLONIC POLYPS 10/07/2009   Anxiety with depression 07/21/2009   DYSPNEA 03/24/2009   GASTROESOPHAGEAL REFLUX DISEASE 01/27/2007   PERCUTANEOUS TRANSLUMINAL CORONARY ANGIOPLASTY, HX OF 02/09/2006   PCP:  [name] Borg, MD Pharmacy:   [facility] [phone], [address] [place] [address] Phone: [phone] Fax: [phone]  [facility] [place], [address] [place] [address] Phone: [phone] Fax: [phone]  [facility] [address] [place] [address] Phone: [phone] Fax: [phone]     Social Determinants of Health (SDOH) Social History: [place]: Food Insecurity Present (02/16/2022)  Housing: Low Risk  (02/16/2022)  Transportation Needs: No Transportation Needs (02/16/2022)  Utilities: Not At Risk (02/16/2022)  Alcohol Screen: Low Risk  (09/20/2021)  Depression (PHQ2-9): Low Risk  (07/27/2022)  Financial Resource Strain: Low Risk  (09/20/2021)  Physical Activity: Inactive (09/20/2021)  Social Connections: Socially Isolated (09/20/2021)  Stress: No Stress Concern Present (09/20/2021)  Tobacco Use: Medium Risk (08/13/2022)   SDOH Interventions:     Readmission Risk Interventions     No data to display

## 2022-08-13 NOTE — Interval H&P Note (Signed)
History and Physical Interval Note:  08/13/2022 9:23 AM  [name] [name]  has presented today for surgery, with the diagnosis of NSTEMI and ventricular tachycardia.  The various methods of treatment have been discussed with the patient and family. After consideration of risks, benefits and other options for treatment, the patient has consented to  Procedure(s): LEFT HEART CATH AND CORONARY ANGIOGRAPHY (N/A) as a surgical intervention.  The patient's history has been reviewed, patient examined, no change in status, stable for surgery.  I have reviewed the patient's chart and labs.  Questions were answered to the patient's satisfaction.    Cath Lab Visit (complete for each Cath Lab visit)  Clinical Evaluation Leading to the Procedure:   ACS: Yes.    Non-ACS:  N/A  [name]

## 2022-08-13 NOTE — Progress Notes (Addendum)
[place] for Heparin Indication: chest pain/ACS  Allergies  Allergen Reactions   Penicillin G Other (See Comments)    Did it involve swelling of the face/tongue/throat, SOB, or low BP?  N/A Did it involve sudden or severe rash/hives, skin peeling, or any reaction on the inside of your mouth or nose? N/A Did you need to seek medical attention at a hospital or doctor's office? N/A When did it last happen? Child If all above answers are "NO", may proceed with cephalosporin use.   Penicillins Other (See Comments)    Did it involve swelling of the face/tongue/throat, SOB, or low BP? N/A Did it involve sudden or severe rash/hives, skin peeling, or any reaction on the inside of your mouth or nose?N/A Did you need to seek medical attention at a hospital or doctor's office? N/A When did it last happen? Child      If all above answers are "NO", may proceed with cephalosporin use.    Patient Measurements: Height: '6\' 1"'$  (185.4 cm) Weight: (!) 145 kg (319 lb 10.7 oz) IBW/kg (Calculated) : 79.9 Heparin Dosing Weight: 112.1 kg  Vital Signs: Temp: 97.8 F (36.6 C) (03/04 0630) Temp Source: Oral (03/04 0630) BP: 142/89 (03/04 0600) Pulse Rate: 52 (03/04 0600)  Labs: Recent Labs    08/11/22 1755 08/11/22 2030 08/12/22 0211 08/12/22 0813 08/13/22 0031 08/13/22 0535  HGB 17.2*  --  16.3  --   --   --   HCT 52.9*  --  48.0  --   --   --   PLT 193  --  184  --   --   --   HEPARINUNFRC  --   --  0.40 0.43 0.48  --   CREATININE 1.76*  --  1.63*  --   --  1.50*  TROPONINIHS 51* 146*  --   --   --   --      Estimated Creatinine Clearance: 73.5 mL/min (A) (by C-G formula based on SCr of 1.5 mg/dL (H)).   Medical History: Past Medical History:  Diagnosis Date   CAD (coronary artery disease)    a. s/p BMS pRCA 2002; b. DES to pRCA & DES p/m RCA 2006; c. PCI/DES OM4 2007; d. PCI/CBA to RCA for ISR 10/2006; e.08/2012 STEMI/Cath/PCI:  LAD 95% >> PCI:  Promus Prem DES  //  f. NSTEMI 10/15 >> LHC: mLAD stent ok, dLAD 70, OM1 CTO, OM2 stent ok, dOM2 90, OM3 30-40, dLCx 90, p-mRCA stent ok, mRCA stent ok w/ 60-70 ISR, EF 50% >> med Rx // MV 1/17: no ischemia // Cath ([facility]) 05/2019: RCA 100 (CTO)    Combined systolic and diastolic CHF    a. Myoview 1/17: EF 35%  //  b. Echo 1/17 EF 45-50% // Echo 05/2019: EF 50-55 // Echocardiogram 8/21: EF 50, inf AK, post and mid inf HK, mod LVH, RVSP 25.3, mild to mod LAE, trivial MR    Depression    Diabetes mellitus ([place])    a. A1c 8.8 08/2012->Metformin initiated. => b. A1c (9/14): 6.6   Gastroesophageal reflux disease    History of nuclear stress test    a. Myoview 1/17: EF 35%, fixed inferior lateral defect consistent with infarct, no ischemia, intermediate risk   History of pneumonia    History of stroke 01/2021   Zio Monitor 11/22: NSR, avg HR 61; PVCs (1.9%); no AFib, no high grade HB, no ventricular arrhythmias   HTN (hypertension)  Hx of NSTEMI    Hyperlipidemia    Ischemic cardiomyopathy    a. EF 40%; improved to normal;  b. 08/2012 Echo: EF 50-55%, mod LVH.//  c. Echo 9/16: Inf HK, mild LVH, EF 55%, mild LAE, normal RVF, mild RAE, PASP 35 mmHg  //  d. Echo 1/17: EF 45-50%, inferior HK, mild BAE, PASP 33 mmHg   Obesity    OSA (obstructive sleep apnea)    Does not use CPAP as of 05/2011   Tobacco abuse     Medications:    Scheduled:   aspirin EC  81 mg Oral Daily   atorvastatin  80 mg Oral Daily   Chlorhexidine Gluconate Cloth  6 each Topical Daily   clopidogrel  75 mg Oral Daily   insulin aspart  0-15 Units Subcutaneous TID WC   insulin aspart  0-5 Units Subcutaneous QHS   isosorbide-hydrALAZINE  1 tablet Oral BID   potassium chloride  40 mEq Oral BID   sodium chloride flush  3 mL Intravenous Q12H   Infusions:   sodium chloride     sodium chloride 10 mL/hr at 08/13/22 0641   amiodarone     amiodarone 60 mg/hr (08/13/22 0600)   heparin 1,500 Units/hr (08/13/22 0600)   lidocaine 1  mg/min (08/13/22 0600)   PRN:   Assessment: 68 yom with a history of CAD, HLD, HTN, CKD, HF, SVT, DM, neuropathy, OSA, CVA, asthma, GERD. Patient is presenting with chest pain. Heparin per pharmacy consult placed for chest pain/ACS.  Runs of vtach in ED received amiodarone and lidocaine in the ED.  Patient is not on anticoagulation prior to arrival.  Hgb 16.3; plt 184 on last check 3/3. Heparin level came back therapeutic at 0.48, on 1500 units/hr. No s/sx of bleeding or infusion issues.  Goal of Therapy:  Heparin level 0.3-0.7 units/ml Monitor platelets by anticoagulation protocol: Yes   Plan:  Continue IV heparin at 1500 units/hr Daily heparin level and CBC. Continue to monitor H&H and platelets  Antonietta Jewel, PharmD, [place] Pharmacist  Phone: [phone] 08/13/2022 7:55 AM  Please check AMION for all [place] phone numbers After 10:00 PM, call [facility] [phone]

## 2022-08-14 ENCOUNTER — Other Ambulatory Visit: Payer: Self-pay

## 2022-08-14 ENCOUNTER — Other Ambulatory Visit (HOSPITAL_COMMUNITY): Payer: Self-pay

## 2022-08-14 ENCOUNTER — Encounter (HOSPITAL_COMMUNITY)
Admission: EM | Disposition: A | Discharge status: Home / Self Care | Payer: Self-pay | Source: Home / Self Care | Attending: Cardiovascular Disease

## 2022-08-14 DIAGNOSIS — I509 Heart failure, unspecified: Secondary | ICD-10-CM

## 2022-08-14 DIAGNOSIS — I472 Ventricular tachycardia, unspecified: Secondary | ICD-10-CM

## 2022-08-14 HISTORY — PX: BIV ICD INSERTION CRT-D: EP1195

## 2022-08-14 LAB — BASIC METABOLIC PANEL
Anion gap: 6 (ref 5–15)
BUN: 21 mg/dL (ref 8–23)
CO2: 22 mmol/L (ref 22–32)
Calcium: 9.4 mg/dL (ref 8.9–10.3)
Chloride: 108 mmol/L (ref 98–111)
Creatinine, Ser: 1.49 mg/dL — ABNORMAL HIGH (ref 0.61–1.24)
GFR, Estimated: 52 mL/min — ABNORMAL LOW (ref >60)
Glucose, Bld: 108 mg/dL — ABNORMAL HIGH (ref 70–99)
Potassium: 3.7 mmol/L (ref 3.5–5.1)
Sodium: 136 mmol/L (ref 135–145)

## 2022-08-14 LAB — SURGICAL PCR SCREEN
MRSA, PCR: NEGATIVE
Staphylococcus aureus: NEGATIVE

## 2022-08-14 LAB — LIPOPROTEIN A (LPA): Lipoprotein (a): 151.5 nmol/L — ABNORMAL HIGH (ref <75.0)

## 2022-08-14 LAB — GLUCOSE, CAPILLARY
Glucose-Capillary: 102 mg/dL — ABNORMAL HIGH (ref 70–99)
Glucose-Capillary: 110 mg/dL — ABNORMAL HIGH (ref 70–99)
Glucose-Capillary: 84 mg/dL (ref 70–99)
Glucose-Capillary: 128 mg/dL — ABNORMAL HIGH (ref 70–99)

## 2022-08-14 SURGERY — BIV ICD INSERTION CRT-D

## 2022-08-14 MED ORDER — SODIUM CHLORIDE 0.9 % IV SOLN
INTRAVENOUS | Status: AC
  Filled 2022-08-14: qty 2

## 2022-08-14 MED ORDER — FENTANYL CITRATE (PF) 100 MCG/2ML IJ SOLN
INTRAMUSCULAR | Status: AC
  Filled 2022-08-14: qty 2

## 2022-08-14 MED ORDER — SODIUM CHLORIDE 0.9 % IV SOLN
INTRAVENOUS | Status: DC | PRN
  Administered 2022-08-14: 80 mg

## 2022-08-14 MED ORDER — POTASSIUM CHLORIDE 10 MEQ/100ML IV SOLN: 10.0000 meq | INTRAVENOUS | Status: DC

## 2022-08-14 MED ORDER — AMIODARONE HCL 200 MG PO TABS
400.0000 mg | ORAL_TABLET | Freq: Two times a day (BID) | ORAL | Status: DC
  Administered 2022-08-14 (×2): 400 mg via ORAL
  Filled 2022-08-14 (×2): qty 2

## 2022-08-14 MED ORDER — LIDOCAINE HCL (PF) 1 % IJ SOLN
INTRAMUSCULAR | Status: AC
  Filled 2022-08-14: qty 30

## 2022-08-14 MED ORDER — VANCOMYCIN HCL 1500 MG/300ML IV SOLN
1500.0000 mg | INTRAVENOUS | Status: AC
  Administered 2022-08-14: 1500 mg via INTRAVENOUS
  Filled 2022-08-14 (×2): qty 300

## 2022-08-14 MED ORDER — LIDOCAINE HCL 1 % IJ SOLN
INTRAMUSCULAR | Status: AC
  Filled 2022-08-14: qty 60

## 2022-08-14 MED ORDER — SODIUM CHLORIDE 0.9 % IV SOLN: INTRAVENOUS | Status: DC

## 2022-08-14 MED ORDER — ACETAMINOPHEN 325 MG PO TABS
325.0000 mg | ORAL_TABLET | ORAL | Status: DC | PRN
  Administered 2022-08-15 – 2022-08-17 (×4): 650 mg via ORAL
  Filled 2022-08-14 (×4): qty 2

## 2022-08-14 MED ORDER — POTASSIUM CHLORIDE CRYS ER 20 MEQ PO TBCR
20.0000 meq | EXTENDED_RELEASE_TABLET | Freq: Once | ORAL | Status: AC
  Administered 2022-08-14: 20 meq via ORAL
  Filled 2022-08-14: qty 1

## 2022-08-14 MED ORDER — MIDAZOLAM HCL 5 MG/5ML IJ SOLN
INTRAMUSCULAR | Status: DC | PRN
  Administered 2022-08-14 (×2): 1 mg via INTRAVENOUS

## 2022-08-14 MED ORDER — FENTANYL CITRATE (PF) 100 MCG/2ML IJ SOLN
INTRAMUSCULAR | Status: DC | PRN
  Administered 2022-08-14 (×2): 50 ug via INTRAVENOUS

## 2022-08-14 MED ORDER — VANCOMYCIN HCL IN DEXTROSE 1-5 GM/200ML-% IV SOLN
1000.0000 mg | Freq: Two times a day (BID) | INTRAVENOUS | Status: AC
  Administered 2022-08-15: 1000 mg via INTRAVENOUS
  Filled 2022-08-14: qty 200

## 2022-08-14 MED ORDER — SODIUM CHLORIDE 0.9 % IV SOLN
80.0000 mg | INTRAVENOUS | Status: AC
  Administered 2022-08-14: 80 mg

## 2022-08-14 MED ORDER — ONDANSETRON HCL 4 MG/2ML IJ SOLN: 4.0000 mg | Freq: Four times a day (QID) | INTRAMUSCULAR | Status: DC | PRN

## 2022-08-14 MED ORDER — SODIUM CHLORIDE 0.9 % IV SOLN
250.0000 mL | INTRAVENOUS | Status: DC
  Administered 2022-08-14: 250 mL via INTRAVENOUS

## 2022-08-14 MED ORDER — MIDAZOLAM HCL 5 MG/5ML IJ SOLN
INTRAMUSCULAR | Status: AC
  Filled 2022-08-14: qty 5

## 2022-08-14 MED ORDER — IOHEXOL 350 MG/ML SOLN
INTRAVENOUS | Status: DC | PRN
  Administered 2022-08-14: 15 mL
  Administered 2022-08-14 (×2): 10 mL

## 2022-08-14 MED ORDER — POTASSIUM CHLORIDE CRYS ER 20 MEQ PO TBCR: 40.0000 meq | EXTENDED_RELEASE_TABLET | Freq: Every day | ORAL | Status: DC

## 2022-08-14 MED ORDER — EMPAGLIFLOZIN 10 MG PO TABS
10.0000 mg | ORAL_TABLET | Freq: Every day | ORAL | Status: DC
  Administered 2022-08-15 – 2022-08-18 (×4): 10 mg via ORAL
  Filled 2022-08-14 (×4): qty 1

## 2022-08-14 MED ORDER — HEPARIN (PORCINE) IN NACL 1000-0.9 UT/500ML-% IV SOLN
INTRAVENOUS | Status: DC | PRN
  Administered 2022-08-14: 500 mL

## 2022-08-14 MED ORDER — CHLORHEXIDINE GLUCONATE 4 % EX LIQD
60.0000 mL | Freq: Once | CUTANEOUS | Status: AC
  Administered 2022-08-14: 4 via TOPICAL
  Filled 2022-08-14: qty 60

## 2022-08-14 MED ORDER — EZETIMIBE 10 MG PO TABS
10.0000 mg | ORAL_TABLET | Freq: Every day | ORAL | Status: DC
  Administered 2022-08-14 – 2022-08-18 (×5): 10 mg via ORAL
  Filled 2022-08-14 (×5): qty 1

## 2022-08-14 MED ORDER — SODIUM CHLORIDE 0.9% FLUSH: 3.0000 mL | Freq: Two times a day (BID) | INTRAVENOUS | Status: DC

## 2022-08-14 MED ORDER — LIDOCAINE HCL (PF) 1 % IJ SOLN
INTRAMUSCULAR | Status: DC | PRN
  Administered 2022-08-14: 60 mL

## 2022-08-14 MED ORDER — SODIUM CHLORIDE 0.9% FLUSH: 3.0000 mL | INTRAVENOUS | Status: DC | PRN

## 2022-08-14 MED ORDER — ISOSORB DINITRATE-HYDRALAZINE 20-37.5 MG PO TABS
1.0000 | ORAL_TABLET | Freq: Three times a day (TID) | ORAL | Status: DC
  Administered 2022-08-14 – 2022-08-18 (×13): 1 via ORAL
  Filled 2022-08-14 (×14): qty 1

## 2022-08-14 MED ORDER — CHLORHEXIDINE GLUCONATE 4 % EX LIQD
60.0000 mL | Freq: Once | CUTANEOUS | Status: AC
  Administered 2022-08-14: 4 via TOPICAL

## 2022-08-14 SURGICAL SUPPLY — 20 items
BALLN COR SINUS VENO 6FR 80 (BALLOONS) ×1
BALLOON COR SINUS VENO 6FR 80 (BALLOONS) IMPLANT
CABLE SURGICAL S-101-97-12 (CABLE) ×1 IMPLANT
CATH CPS DIRECT 135 DS2C020 (CATHETERS) IMPLANT
ICD GALLANT HFCRTD CDHFA500Q (ICD Generator) IMPLANT
KIT ESSENTIALS PG (KITS) IMPLANT
LEAD DURATA 7122Q-65CM (Lead) IMPLANT
LEAD QUARTET 1458QL-86 (Lead) IMPLANT
LEAD ULTIPACE 52 LPA1231/52 (Lead) IMPLANT
PAD DEFIB RADIO PHYSIO CONN (PAD) ×1 IMPLANT
POUCH AIGIS-R ANTIBACT ICD (Mesh General) ×1 IMPLANT
POUCH AIGIS-R ANTIBACT ICD LRG (Mesh General) IMPLANT
QUARTET 1458QL-86 (Lead) ×1 IMPLANT
SHEATH 7FR PRELUDE SNAP 13 (SHEATH) IMPLANT
SHEATH 9.5FR PRELUDE SNAP 13 (SHEATH) IMPLANT
SHEATH PROBE COVER 6X72 (BAG) IMPLANT
SLITTER AGILIS HISPRO (INSTRUMENTS) IMPLANT
TRAY PACEMAKER INSERTION (PACKS) ×1 IMPLANT
WIRE ACUITY WHISPER EDS 4648 (WIRE) IMPLANT
WIRE HI TORQ VERSACORE-J 145CM (WIRE) IMPLANT

## 2022-08-14 NOTE — Progress Notes (Addendum)
Cards fellow paged for orders to administer pt.'s BP medications early. Waiting for call to be returned.   2008: Paged returned. Verbal orders received to administer 2200 dose of isosorbide-hydralazine early.

## 2022-08-14 NOTE — Progress Notes (Signed)
Patient Name: [name] [name] Date of Encounter: 08/14/2022  Primary Cardiologist: Sherren Mocha, MD Electrophysiologist: None  Interval Summary   The patient is doing well today.  At this time, the patient denies chest pain, shortness of breath, or any new concerns. Sitting in chair  Inpatient Medications    Scheduled Meds:  amLODipine  5 mg Oral Daily   aspirin EC  81 mg Oral Daily   atorvastatin  80 mg Oral Daily   Chlorhexidine Gluconate Cloth  6 each Topical Daily   empagliflozin  10 mg Oral Daily   insulin aspart  0-15 Units Subcutaneous TID WC   insulin aspart  0-5 Units Subcutaneous QHS   isosorbide-hydrALAZINE  1 tablet Oral BID   mexiletine  250 mg Oral Q12H   sodium chloride flush  3 mL Intravenous Q12H   sodium chloride flush  3 mL Intravenous Q12H   Continuous Infusions:  sodium chloride     amiodarone 30 mg/hr (08/13/22 2000)   amiodarone Stopped (08/13/22 1517)   PRN Meds: sodium chloride, acetaminophen, nitroGLYCERIN, sodium chloride flush   Vital Signs    Vitals:   08/14/22 0300 08/14/22 0400 08/14/22 0500 08/14/22 0625  BP:      Pulse: (!) 54 (!) 57 (!) 56   Resp: '19 18 19   '$ Temp:    97.9 F (36.6 C)  TempSrc:    Oral  SpO2: 96% 95% 96%   Weight:      Height:        Intake/Output Summary (Last 24 hours) at 08/14/2022 0800 Last data filed at 08/14/2022 [id number] Gross per 24 hour  Intake 729.15 ml  Output 1700 ml  Net -970.85 ml   Filed Weights   08/11/22 2025 08/12/22 0600 08/13/22 0600  Weight: (!) 144.8 kg (!) 144.8 kg (!) 145 kg    Physical Exam    GEN- The patient is well appearing, alert and oriented x 3 today.   Lungs- Clear to ausculation bilaterally, normal work of breathing Cardiac- Regular rate and rhythm, no murmurs, rubs or gallops GI- soft, NT, ND, + BS Extremities- no clubbing or cyanosis. No edema  Telemetry    SB in 45-60s with PVCs, short run of NSVT (personally reviewed)  [place] is a 67  y.o. male with a PMH of chronic systolic heart failure, coronary artery disease, diabetes, PVCs who is being seen  for the evaluation of ventricular tachycardia  He presented to the emergency room and was noted to be in ventricular tachycardia. He received IV amiodarone with some improvement in his VT. He continued to have episodes of VT and received 100 mg of lidocaine and was started on lidocaine drip. After starting the lidocaine, his VT significantly improved. Chest pain also improved.     No notes on file  Assessment & Plan    #) VT H/o CAD, LHC yesterday with no new actionable lesions Updated echo with reduced EF to 30-35% Amiodarone gtt lowered yesterday to 30/hr Transition to PO '200mg'$  BID amiodarone today Lidocaine gtt > PO mexiletine 250 BID yesterday Lidocaine level yesterday significantly elevated, most likely drawn for infusion line Anticipate needing CRT-D with need for bradycardia with amiodarone and BB, and RBBB w HFrEF - NPO for device today Reviewed risks/benefits of device implantation, patient verbalized understanding and wished to proceed with procedure  #) CAD Updated LHC yesterday, no interventions No CP  #) CHF Due to ICM Plan per gen cards  #) CKD  Cr  improving Hypokalemic today, replete IV  Dr. Myles [name] has seen    For questions or updates, please contact [place] Please consult [email or web address] for contact info under Cardiology/STEMI.  Signed, [name] Levers, NP  08/14/2022, 8:00 AM

## 2022-08-14 NOTE — Progress Notes (Signed)
   Heart Failure Stewardship Pharmacist Progress Note   PCP: Biagio Borg, MD PCP-Cardiologist: Sherren Mocha, MD    HPI:  67 yo M with PMH of CAD, CHF, HTN, HLD, SVT, T2DM, OSA, CVA, asthma, and GERD.  Presented to the ED on 3/2 with chest pain and palpitations. On EMS arrival, patient was in VT. He received 150 mg bolus of amiodarone prior to arrival. Started on amiodarone drip and then had sustained VT. He was rebolused on amiodarone and started on a lidocaine drip. An ECHO was done on 3/3 and LVEF was reduced to 30-35% (was 45-50% in 2023)  with regional wall motion abnormalities, mild LVH, and RV normal. Taken for cath on 3/4 and multivessel CAD is unchanged from 2020. LVEDP 20. EP following and plan for device implant today.  Current HF Medications: SGLT2i: Jardiance 10 mg daily Other: BiDil 1 tab TID  Prior to admission HF Medications: Diuretic: torsemide 20 mg daily Beta blocker: carvedilol 25 mg BID SGLT2i: Jardiance 25 mg daily Other: Imdur 30 mg daily   Pertinent Lab Values: Serum creatinine 1.49, BUN 21, Potassium 3.7, Sodium 136, BNP 95.3, Magnesium 2.2, A1c 7.3   Vital Signs: Weight: 321 lbs (admission weight: 319 lbs) Blood pressure: 130-160/90s  Heart rate: 50s  I/O: -0.2L yesterday; net +0.6L  Medication Assistance / Insurance Benefits Check: Does the patient have prescription insurance?  Yes Type of insurance plan: Medicare + Medicaid  Outpatient Pharmacy:  [facility] Is the patient willing to use [facility] at discharge? Yes Is the patient willing to transition their outpatient pharmacy to utilize a [facility] outpatient pharmacy?   No    Assessment: 1. Acute on chronic systolic CHF (LVEF 99991111), due to ICM. NYHA class II symptoms. - No BB with bradycardia - Agree with increasing to BiDil 1 tab TID - Continue Jardiance 10 mg daily - May be able to transition off amlodipine and to [facility] or  spironolactone pending creatinine tomorrow   Plan: 1) Medication changes recommended at this time: - Agree with changes - Stop amlodipine and start Entresto/spironolactone tomorrow  2) Patient assistance: - None pending  3)  Education  - Patient has been educated on current HF medications and potential additions to HF medication regimen - Patient verbalizes understanding that over the next few months, these medication doses may change and more medications may be added to optimize HF regimen - Patient has been educated on basic disease state pathophysiology and goals of therapy   Kerby Nora, PharmD, BCPS Heart Failure Stewardship Pharmacist Phone [phone]

## 2022-08-14 NOTE — TOC Benefit Eligibility Note (Signed)
Patient Teacher, English as a foreign language completed.    The patient is currently admitted and upon discharge could be taking mexiletine (Mexitil) 200 mg capsules.  The current 30 day co-pay is $0.00.   The patient is insured through [place], [place] Patient Advocate Specialist [place] Patient Advocate Team Direct Number: [phone]  Fax: [phone]

## 2022-08-14 NOTE — Progress Notes (Signed)
Rounding Note    Patient Name: [name] [name] Date of Encounter: 08/14/2022  [place] Cardiologist: Sherren Mocha, MD   Subjective   No chest pain  Inpatient Medications    Scheduled Meds:  amLODipine  5 mg Oral Daily   aspirin EC  81 mg Oral Daily   atorvastatin  80 mg Oral Daily   chlorhexidine  60 mL Topical Once   chlorhexidine  60 mL Topical Once   Chlorhexidine Gluconate Cloth  6 each Topical Daily   [START ON 08/15/2022] empagliflozin  10 mg Oral Daily   gentamicin (GARAMYCIN) 80 mg in sodium chloride 0.9 % 500 mL irrigation  80 mg Irrigation On Call   insulin aspart  0-15 Units Subcutaneous TID WC   insulin aspart  0-5 Units Subcutaneous QHS   isosorbide-hydrALAZINE  1 tablet Oral TID   mexiletine  250 mg Oral Q12H   sodium chloride flush  3 mL Intravenous Q12H   sodium chloride flush  3 mL Intravenous Q12H   sodium chloride flush  3 mL Intravenous Q12H   Continuous Infusions:  sodium chloride     sodium chloride     sodium chloride     amiodarone 30 mg/hr (08/14/22 0800)   amiodarone Stopped (08/13/22 1517)   vancomycin     PRN Meds: sodium chloride, acetaminophen, nitroGLYCERIN, sodium chloride flush, sodium chloride flush   Vital Signs    Vitals:   08/14/22 0400 08/14/22 0500 08/14/22 0625 08/14/22 0800  BP:    (!) 163/99  Pulse: (!) 57 (!) 56  (!) 54  Resp: '18 19  15  '$ Temp:   97.9 F (36.6 C) 97.8 F (36.6 C)  TempSrc:   Oral Oral  SpO2: 95% 96%  98%  Weight:    (!) 146 kg  Height:        Intake/Output Summary (Last 24 hours) at 08/14/2022 0842 Last data filed at 08/14/2022 0800 Gross per 24 hour  Intake 928.46 ml  Output 1700 ml  Net -771.54 ml      08/14/2022    8:00 AM 08/13/2022    6:00 AM 08/12/2022    6:00 AM  Last 3 Weights  Weight (lbs) 321 lb 14 oz 319 lb 10.7 oz 319 lb 3.6 oz  Weight (kg) 146 kg 145 kg 144.8 kg      Telemetry    Sinus bradycardia at 52 - Personally Reviewed  ECG    08/13/2022 ECG (independently  read by me): Sinus bradycardia at 52, RBBB, LAHB, Q III, QTc 509 msec  Physical Exam    BP (!) 163/99 (BP Location: Left Arm)   Pulse (!) 54   Temp 97.8 F (36.6 C) (Oral)   Resp 15   Ht '6\' 1"'$  (1.854 m)   Wt (!) 146 kg   SpO2 98%   BMI 42.47 kg/m  General: Alert, oriented, no distress.  Skin: normal turgor, no rashes, warm and dry HEENT: Normocephalic, atraumatic. Pupils equal round and reactive to light; sclera anicteric; extraocular muscles intact;  Nose without nasal septal hypertrophy Mouth/Parynx: large tongue; Mallinpatti scale 4 Neck: Thick neck; No JVD, no carotid bruits; normal carotid upstroke Lungs: clear to ausculatation and percussion; no wheezing or rales Chest wall: without tenderness to palpitation Heart: PMI not displaced, RRR, s1 s2 normal, 1/6 systolic murmur, no diastolic murmur, no rubs, gallops, thrills, or heaves Abdomen: soft, nontender; no hepatosplenomehaly, BS+; abdominal aorta nontender and not dilated by palpation. Back: no CVA tenderness Pulses 2+ R  radial cath site stable Musculoskeletal: full range of motion, normal strength, no joint deformities Extremities: no clubbing cyanosis or edema, Homan's sign negative  Neurologic: grossly nonfocal; Cranial nerves grossly wnl Psychologic: Normal mood and affect    Labs    High Sensitivity Troponin:   Recent Labs  Lab 08/11/22 1755 08/11/22 2030  TROPONINIHS 51* 146*     Chemistry Recent Labs  Lab 08/11/22 1755 08/12/22 0211 08/12/22 1048 08/13/22 0535 08/14/22 0144  NA 139 137  --  135 136  K 4.1 3.1* 3.6 3.4* 3.7  CL 102 104  --  100 108  CO2 23 23  --  24 22  GLUCOSE 147* 134*  --  328* 108*  BUN 25* 26*  --  23 21  CREATININE 1.76* 1.63*  --  1.50* 1.49*  CALCIUM 9.6 8.9  --  9.1 9.4  MG 2.2  --   --   --   --   PROT 7.4  --   --   --   --   ALBUMIN 3.8  --   --   --   --   AST 30  --   --   --   --   ALT 17  --   --   --   --   ALKPHOS 87  --   --   --   --   BILITOT 1.3*  --    --   --   --   GFRNONAA 42* 46*  --  51* 52*  ANIONGAP 14 10  --  11 6    Lipids  Recent Labs  Lab 08/12/22 0211  CHOL 212*  TRIG 104  HDL 46  LDLCALC 145*  CHOLHDL 4.6    Hematology Recent Labs  Lab 08/11/22 1755 08/12/22 0211  WBC 8.1 6.5  RBC 5.97* 5.52  HGB 17.2* 16.3  HCT 52.9* 48.0  MCV 88.6 87.0  MCH 28.8 29.5  MCHC 32.5 34.0  RDW 16.0* 15.6*  PLT 193 184   Thyroid No results for input(s): "TSH", "FREET4" in the last 168 hours.  BNP Recent Labs  Lab 08/11/22 1755  BNP 95.3    DDimer No results for input(s): "DDIMER" in the last 168 hours.   Radiology    CARDIAC CATHETERIZATION  Result Date: 08/13/2022 Conclusions: Multivessel coronary artery disease, including 99% in-stent restenosis of mid RCA stent with occlusion of distal RCA (may be due to competitive flow from left-to-right collaterals).  There is also 90% stenosis of the apical LAD and mild-moderate disease involving the more proximal LAD and LCx. Mildly elevated left ventricular filling pressure (LVEDP 20 mmHg). Tortuous right subclavian/brachiocephalic artery limiting catheter manipulation.  Consider alternate approach for future catheterizations. Recommendations: Severe RCA disease is unchanged since 2020 based on cath report from [place].  Question if VT is related to scar from prior inferior MI. Continue medical therapy and aggressive secondary prevention.  Apical LAD stenosis is too distal for percutaneous intervention. Follow-up EP recommendations regarding management of sustained VT. Nelva Bush, MD [facility]  ECHOCARDIOGRAM COMPLETE  Result Date: 08/12/2022    ECHOCARDIOGRAM REPORT   Patient Name:   [name] [name] Date of Exam: 08/12/2022 Medical Rec #:  [id number]      Height:       73.0 in Accession #:    [id number]     Weight:       319.2 lb Date of Birth:  [birth year]       BSA:  2.625 m Patient Age:    68 years       BP:           134/85 mmHg Patient Gender: M              HR:            53 bpm. Exam Location:  Inpatient Procedure: 2D Echo, Cardiac Doppler and Color Doppler Indications:    Ventricular Tachycardia  History:        Patient has prior history of Echocardiogram examinations, most                 recent 02/16/2022. Cardiomyopathy, CAD, chronic kidney disease,                 Arrythmias:PVC, Signs/Symptoms:Shortness of Breath; Risk                 Factors:Hypertension, Diabetes, Dyslipidemia, Sleep Apnea and                 Former Smoker.  Sonographer:    Johny Chess RDCS Referring Phys: [id number] [place] CHRISTOPHER  Sonographer Comments: Patient is obese. Image acquisition challenging due to patient body habitus. IMPRESSIONS  1. Left ventricular ejection fraction, by estimation, is 30 to 35%. The left ventricle has moderately decreased function. The left ventricle demonstrates regional wall motion abnormalities (see scoring diagram/findings for description). There is mild left ventricular hypertrophy. Left ventricular diastolic parameters are consistent with Grade II diastolic dysfunction (pseudonormalization). Elevated left atrial pressure.  2. Right ventricular systolic function is normal. The right ventricular size is normal. There is mildly elevated pulmonary artery systolic pressure. The estimated right ventricular systolic pressure is 0000000 mmHg.  3. Left atrial size was mildly dilated.  4. The mitral valve is normal in structure. Trivial mitral valve regurgitation. No evidence of mitral stenosis.  5. The aortic valve is tricuspid. Aortic valve regurgitation is not visualized. No aortic stenosis is present.  6. The inferior vena cava is dilated in size with >50% respiratory variability, suggesting right atrial pressure of 8 mmHg. FINDINGS  Left Ventricle: Left ventricular ejection fraction, by estimation, is 30 to 35%. The left ventricle has moderately decreased function. The left ventricle demonstrates regional wall motion abnormalities. The left ventricular internal cavity  size was normal in size. There is mild left ventricular hypertrophy. Left ventricular diastolic parameters are consistent with Grade II diastolic dysfunction (pseudonormalization). Elevated left atrial pressure.  LV Wall Scoring: The mid and distal lateral wall, posterior wall, mid anterolateral segment, and basal inferior segment are akinetic. The mid and distal inferior wall, basal anterolateral segment, and basal inferoseptal segment are hypokinetic. The entire anterior wall, entire anterior septum, mid inferoseptal segment, and apex are normal. Right Ventricle: The right ventricular size is normal. No increase in right ventricular wall thickness. Right ventricular systolic function is normal. There is mildly elevated pulmonary artery systolic pressure. The tricuspid regurgitant velocity is 2.84  m/s, and with an assumed right atrial pressure of 8 mmHg, the estimated right ventricular systolic pressure is 0000000 mmHg. Left Atrium: Left atrial size was mildly dilated. Right Atrium: Right atrial size was normal in size. Pericardium: There is no evidence of pericardial effusion. Mitral Valve: The mitral valve is normal in structure. Trivial mitral valve regurgitation. No evidence of mitral valve stenosis. Tricuspid Valve: The tricuspid valve is normal in structure. Tricuspid valve regurgitation is mild. Aortic Valve: The aortic valve is tricuspid. Aortic valve regurgitation is not visualized. No aortic stenosis is present. Pulmonic  Valve: The pulmonic valve was not well visualized. Pulmonic valve regurgitation is not visualized. Aorta: The aortic root and ascending aorta are structurally normal, with no evidence of dilitation. Venous: The inferior vena cava is dilated in size with greater than 50% respiratory variability, suggesting right atrial pressure of 8 mmHg. IAS/Shunts: The interatrial septum was not well visualized.  LEFT VENTRICLE PLAX 2D LVIDd:         5.60 cm      Diastology LVIDs:         5.00 cm      LV  e' medial:    5.87 cm/s LV PW:         1.20 cm      LV E/e' medial:  14.1 LV IVS:        1.10 cm      LV e' lateral:   5.66 cm/s LVOT diam:     2.20 cm      LV E/e' lateral: 14.6 LV SV:         77 LV SV Index:   29 LVOT Area:     3.80 cm  LV Volumes (MOD) LV vol d, MOD A2C: 135.0 ml LV vol d, MOD A4C: 188.0 ml LV vol s, MOD A2C: 100.0 ml LV vol s, MOD A4C: 113.0 ml LV SV MOD A2C:     35.0 ml LV SV MOD A4C:     188.0 ml LV SV MOD BP:      53.0 ml RIGHT VENTRICLE             IVC RV Basal diam:  3.70 cm     IVC diam: 2.40 cm RV S prime:     10.10 cm/s TAPSE (M-mode): 1.9 cm LEFT ATRIUM              Index        RIGHT ATRIUM           Index LA diam:        4.50 cm  1.71 cm/m   RA Area:     23.60 cm LA Vol (A2C):   116.0 ml 44.20 ml/m  RA Volume:   74.90 ml  28.54 ml/m LA Vol (A4C):   72.8 ml  27.74 ml/m LA Biplane Vol: 93.2 ml  35.51 ml/m  AORTIC VALVE LVOT Vmax:   103.00 cm/s LVOT Vmean:  62.600 cm/s LVOT VTI:    0.203 m  AORTA Ao Root diam: 3.40 cm Ao Asc diam:  3.70 cm MITRAL VALVE               TRICUSPID VALVE MV Area (PHT): 3.68 cm    TV Peak grad:   29.8 mmHg MV Decel Time: 206 msec    TV Vmax:        2.73 m/s MV E velocity: 82.70 cm/s  TR Peak grad:   32.3 mmHg MV A velocity: 44.80 cm/s  TR Vmax:        284.00 cm/s MV E/A ratio:  1.85                            SHUNTS                            Systemic VTI:  0.20 m  Systemic Diam: 2.20 cm Oswaldo Milian MD Electronically signed by Oswaldo Milian MD Signature Date/Time: 08/12/2022/3:37:32 PM    Final     Cardiac Studies    ECHO: 08/11/2022  1. Left ventricular ejection fraction, by estimation, is 30 to 35%. The  left ventricle has moderately decreased function. The left ventricle  demonstrates regional wall motion abnormalities (see scoring  diagram/findings for description). There is mild  left ventricular hypertrophy. Left ventricular diastolic parameters are  consistent with Grade II diastolic dysfunction  (pseudonormalization).  Elevated left atrial pressure.   2. Right ventricular systolic function is normal. The right ventricular  size is normal. There is mildly elevated pulmonary artery systolic  pressure. The estimated right ventricular systolic pressure is 0000000 mmHg.   3. Left atrial size was mildly dilated.   4. The mitral valve is normal in structure. Trivial mitral valve  regurgitation. No evidence of mitral stenosis.   5. The aortic valve is tricuspid. Aortic valve regurgitation is not  visualized. No aortic stenosis is present.   6. The inferior vena cava is dilated in size with >50% respiratory  variability, suggesting right atrial pressure of 8 mmHg.   FINDINGS   Left Ventricle: Left ventricular ejection fraction, by estimation, is 30  to 35%. The left ventricle has moderately decreased function. The left  ventricle demonstrates regional wall motion abnormalities. The left  ventricular internal cavity size was  normal in size. There is mild left ventricular hypertrophy. Left  ventricular diastolic parameters are consistent with Grade II diastolic  dysfunction (pseudonormalization). Elevated left atrial pressure.     LV Wall Scoring:  The mid and distal lateral wall, posterior wall, mid anterolateral  segment,  and basal inferior segment are akinetic. The mid and distal inferior wall,  basal anterolateral segment, and basal inferoseptal segment are  hypokinetic.  The entire anterior wall, entire anterior septum, mid inferoseptal  segment,  and apex are normal.     CARDIAC CATH: 08/12/2022 Conclusions: Multivessel coronary artery disease, including 99% in-stent restenosis of mid RCA stent with occlusion of distal RCA (may be due to competitive flow from left-to-right collaterals).  There is also 90% stenosis of the apical LAD and mild-moderate disease involving the more proximal LAD and LCx. Mildly elevated left ventricular filling pressure (LVEDP 20 mmHg). Tortuous  right subclavian/brachiocephalic artery limiting catheter manipulation.  Consider alternate approach for future catheterizations.   Recommendations: Severe RCA disease is unchanged since 2020 based on cath report from [place].  Question if VT is related to scar from prior inferior MI. Continue medical therapy and aggressive secondary prevention.  Apical LAD stenosis is too distal for percutaneous intervention. Follow-up EP recommendations regarding management of sustained VT.     Patient Profile     67 y.o. male 67 y.o. male with a hx of CAD, type II diabetes, ischemic cardiomyopathy, PVCs who is being seen 08/11/2022 for the evaluation of ventricular tachycardia at the request of Dr. Doren Custard.    Assessment & Plan    CAD: Patient underwent cardiac catheterization this morning.  I have personally reviewed the angiographic images which demonstrate severe RCA disease, unchanged from 2020 with patent proximal LAD stent, 90% apical LAD stenosis, and moderate stenosis in the left circumflex vessel.  Plan for medical therapy for his underlying CAD.  Will continue DAPT with aspirin/clopidogrel. Ventricular tachycardia: No recurrence with patient now on amiodarone and lidocaine.  Suspect possible scar mediated VT. he has been on IV amiodarone and lidocaine; lidocaine was DC'd yesterday and he  was started on mexiletine 250 mg twice a day.  Plan today to transition to oral amiodarone and DC IV.  Plan for defibrillator planned this afternoon. Ischemic cardiomyopathy.  EF 30 to 35% on echo with grade 2 diastolic dysfunction, and estimated RV pressure 40.3 mmHg.  Plan to ultimately initiate guideline directed medical therapy.  Not started yet due to renal insufficiency.  Jardiance 10 mg was added yesterday.  Will hold this morning with plans for defibrillator later today.  Will also increase isosorbide/hydralazine to 3 times per day.  Apparently he was on an increased dose prior to admission which was reduced on  presentation.  Once renal function improves, Entresto and future spironolactone will be added. AKI: Initial creatinine 1.76 on admission.  Improved to 1.49 today Hypertension: Patient was hypertensive last evening with systolic blood pressures in the 170s.  Amlodipine was added 5 mg.  This will be helpful with his CAD.  However, once we are able to more optimally titrate BiDil and initiate Entresto if renal function improves, upon blood pressure amlodipine may not be necessary. Hypokalemia: Initially 3.1> 3.4 > 3.7 Hyperlipidemia with target less than 55.  LDL cholesterol 145.  Currently on atorvastatin 80 mg.  Will add Zetia 10 mg today and he may need future PCSK9 inhibition. Type 2 diabetes mellitus: Metformin initiated in 2014. Obstructive sleep apnea: Patient remotely was diagnosed in 2012, his machine was taken away due to noncompliance.  I discussed with him the potential adverse cardiovascular consequences of untreated sleep apnea with reference to its effect on hypertension, nocturnal arrhythmias, increased risk for atrial fibrillation, negative effects with reference to insulin resistance, increased inflammatory markers, nocturnal GERD and potential for nocturnal hypoxemia contributing to nocturnal ischemia.  I discussed the importance of reinitiating sleep evaluation postdischarge and he agrees to pursue this. 9.  Obesity: BMI 42.2.  Importance of weight loss.   For questions or updates, please contact [place] Please consult [email or web address] for contact info under      Signed, Shelva Majestic, MD  08/14/2022, 8:42 AM

## 2022-08-15 ENCOUNTER — Inpatient Hospital Stay (HOSPITAL_COMMUNITY): Payer: 59

## 2022-08-15 ENCOUNTER — Other Ambulatory Visit: Payer: Self-pay | Admitting: Cardiovascular Disease

## 2022-08-15 ENCOUNTER — Encounter (HOSPITAL_COMMUNITY): Payer: Self-pay | Admitting: Cardiovascular Disease

## 2022-08-15 DIAGNOSIS — G4733 Obstructive sleep apnea (adult) (pediatric): Secondary | ICD-10-CM

## 2022-08-15 DIAGNOSIS — I2510 Atherosclerotic heart disease of native coronary artery without angina pectoris: Secondary | ICD-10-CM

## 2022-08-15 DIAGNOSIS — I5032 Chronic diastolic (congestive) heart failure: Secondary | ICD-10-CM

## 2022-08-15 DIAGNOSIS — E785 Hyperlipidemia, unspecified: Secondary | ICD-10-CM

## 2022-08-15 DIAGNOSIS — I10 Essential (primary) hypertension: Secondary | ICD-10-CM

## 2022-08-15 LAB — BASIC METABOLIC PANEL
Anion gap: 9 (ref 5–15)
BUN: 16 mg/dL (ref 8–23)
CO2: 20 mmol/L — ABNORMAL LOW (ref 22–32)
Calcium: 9.5 mg/dL (ref 8.9–10.3)
Chloride: 110 mmol/L (ref 98–111)
Creatinine, Ser: 1.39 mg/dL — ABNORMAL HIGH (ref 0.61–1.24)
GFR, Estimated: 56 mL/min — ABNORMAL LOW (ref >60)
Glucose, Bld: 111 mg/dL — ABNORMAL HIGH (ref 70–99)
Potassium: 3.8 mmol/L (ref 3.5–5.1)
Sodium: 139 mmol/L (ref 135–145)

## 2022-08-15 LAB — MAGNESIUM: Magnesium: 2.2 mg/dL (ref 1.7–2.4)

## 2022-08-15 LAB — GLUCOSE, CAPILLARY
Glucose-Capillary: 124 mg/dL — ABNORMAL HIGH (ref 70–99)
Glucose-Capillary: 146 mg/dL — ABNORMAL HIGH (ref 70–99)
Glucose-Capillary: 92 mg/dL (ref 70–99)
Glucose-Capillary: 138 mg/dL — ABNORMAL HIGH (ref 70–99)
Glucose-Capillary: 119 mg/dL — ABNORMAL HIGH (ref 70–99)

## 2022-08-15 MED ORDER — SACUBITRIL-VALSARTAN 24-26 MG PO TABS
1.0000 | ORAL_TABLET | Freq: Two times a day (BID) | ORAL | Status: DC
  Administered 2022-08-15 – 2022-08-16 (×3): 1 via ORAL
  Filled 2022-08-15 (×4): qty 1

## 2022-08-15 MED ORDER — AMIODARONE HCL 200 MG PO TABS
400.0000 mg | ORAL_TABLET | Freq: Two times a day (BID) | ORAL | Status: DC
  Administered 2022-08-15 – 2022-08-18 (×7): 400 mg via ORAL
  Filled 2022-08-15 (×7): qty 2

## 2022-08-15 MED ORDER — AMIODARONE HCL 200 MG PO TABS: 200.0000 mg | ORAL_TABLET | Freq: Every day | ORAL | Status: DC

## 2022-08-15 MED ORDER — HYDRALAZINE HCL 20 MG/ML IJ SOLN
5.0000 mg | Freq: Four times a day (QID) | INTRAMUSCULAR | Status: DC | PRN
  Administered 2022-08-15 (×2): 5 mg via INTRAVENOUS
  Filled 2022-08-15 (×2): qty 1

## 2022-08-15 MED ORDER — POTASSIUM CHLORIDE CRYS ER 20 MEQ PO TBCR
20.0000 meq | EXTENDED_RELEASE_TABLET | Freq: Once | ORAL | Status: AC
  Administered 2022-08-15: 20 meq via ORAL
  Filled 2022-08-15: qty 1

## 2022-08-15 MED ORDER — SPIRONOLACTONE 12.5 MG HALF TABLET
12.5000 mg | ORAL_TABLET | Freq: Every day | ORAL | Status: DC
  Administered 2022-08-15 – 2022-08-18 (×4): 12.5 mg via ORAL
  Filled 2022-08-15 (×4): qty 1

## 2022-08-15 NOTE — Progress Notes (Addendum)
   Heart Failure Stewardship Pharmacist Progress Note   PCP: Biagio Borg, MD PCP-Cardiologist: Sherren Mocha, MD    HPI:  67 yo M with PMH of CAD, CHF, HTN, HLD, SVT, T2DM, OSA, CVA, asthma, and GERD.  Presented to the ED on 3/2 with chest pain and palpitations. On EMS arrival, patient was in VT. He received 150 mg bolus of amiodarone prior to arrival. Started on amiodarone drip and then had sustained VT. He was rebolused on amiodarone and started on a lidocaine drip. An ECHO was done on 3/3 and LVEF was reduced to 30-35% (was 45-50% in 2023)  with regional wall motion abnormalities, mild LVH, and RV normal. Taken for cath on 3/4 and multivessel CAD is unchanged from 2020. LVEDP 20. EP following and s/p CRT-D implant on 3/5.  Current HF Medications: ACE/ARB/ARNI: Entresto 24/26 mg BID MRA: spironolactone 12.5 mg daily SGLT2i: Jardiance 10 mg daily Other: BiDil 1 tab TID  Prior to admission HF Medications: Diuretic: torsemide 20 mg daily Beta blocker: carvedilol 25 mg BID SGLT2i: Jardiance 25 mg daily Other: Imdur 30 mg daily   Pertinent Lab Values: Serum creatinine 1.39, BUN 16, Potassium 3.8, Sodium 139, BNP 95.3, Magnesium 2.2, A1c 7.3   Vital Signs: Weight: 324 lbs (admission weight: 319 lbs) Blood pressure: 150-180/90s  Heart rate: 70-80s  I/O: -0.6L yesterday; net -0.7L  Medication Assistance / Insurance Benefits Check: Does the patient have prescription insurance?  Yes Type of insurance plan: Medicare + Medicaid  Outpatient Pharmacy:  Prior to admission outpatient pharmacy: [facility] Is the patient willing to use [facility] at discharge? Yes Is the patient willing to transition their outpatient pharmacy to utilize a [facility] outpatient pharmacy?   No    Assessment: 1. Acute on chronic systolic CHF (LVEF [id number]), due to ICM. NYHA class II symptoms. - No BB yet with bradycardia earlier this admission, BiV ICD placed on 3/5 - Agree with adding Entresto  24/26 mg BID and spironolactone 12.5 mg daily - Continue BiDil 1 tab TID - Continue Jardiance 10 mg daily - May be able to transition off amlodipine tomorrow and further titrate [employer]   Plan: 1) Medication changes recommended at this time: - Agree with changes  2) Patient assistance: - None pending  3)  Education  - Patient has been educated on current HF medications and potential additions to HF medication regimen - Patient verbalizes understanding that over the next few months, these medication doses may change and more medications may be added to optimize HF regimen - Patient has been educated on basic disease state pathophysiology and goals of therapy   Kerby Nora, PharmD, BCPS Heart Failure Cytogeneticist Phone [phone]

## 2022-08-15 NOTE — Progress Notes (Signed)
Patient Name: [name] [name] Date of Encounter: 08/15/2022  Primary Cardiologist: [name] Mocha, MD Electrophysiologist: None  Interval Summary   The patient is doing well today.  At this time, the patient denies chest pain, shortness of breath, or any new concerns. Sitting in chair  Inpatient Medications    Scheduled Meds:  amiodarone  400 mg Oral BID   amLODipine  5 mg Oral Daily   aspirin EC  81 mg Oral Daily   atorvastatin  80 mg Oral Daily   Chlorhexidine Gluconate Cloth  6 each Topical Daily   empagliflozin  10 mg Oral Daily   ezetimibe  10 mg Oral Daily   insulin aspart  0-15 Units Subcutaneous TID WC   insulin aspart  0-5 Units Subcutaneous QHS   isosorbide-hydrALAZINE  1 tablet Oral TID   mexiletine  250 mg Oral Q12H   potassium chloride  40 mEq Oral Daily   sodium chloride flush  3 mL Intravenous Q12H   sodium chloride flush  3 mL Intravenous Q12H   Continuous Infusions:  sodium chloride     PRN Meds: sodium chloride, acetaminophen, hydrALAZINE, nitroGLYCERIN, ondansetron (ZOFRAN) IV, sodium chloride flush   Vital Signs    Vitals:   08/15/22 0615 08/15/22 0630 08/15/22 0645 08/15/22 0700  BP: (!) 191/99 (!) 191/108 (!) 193/109 (!) 174/112  Pulse: 74 79 80 79  Resp: (!) 26 (!) 32 20 (!) 28  Temp:      TempSrc:      SpO2: 96% 96% 96% 96%  Weight:      Height:        Intake/Output Summary (Last 24 hours) at 08/15/2022 0810 Last data filed at 08/15/2022 0612 Gross per 24 hour  Intake 572.07 ml  Output 900 ml  Net -327.93 ml    Filed Weights   08/12/22 0600 08/13/22 0600 08/14/22 0800  Weight: (!) 144.8 kg (!) 145 kg (!) 146 kg    Physical Exam    GEN- The patient is well appearing, alert and oriented x 3 today.   Lungs- Clear to ausculation bilaterally, normal work of breathing Cardiac- Regular rate and rhythm, no murmurs, rubs or gallops GI- soft, NT, ND, + BS Extremities- no clubbing or cyanosis. No edema  Telemetry    SB in 45-60s with  PVCs, short run of NSVT (personally reviewed)  [name] is a 67 y.o. male with a PMH of chronic systolic heart failure, coronary artery disease, diabetes, PVCs who is being seen  for the evaluation of ventricular tachycardia  He presented to the emergency room and was noted to be in ventricular tachycardia. He received IV amiodarone with some improvement in his VT. He continued to have episodes of VT and received 100 mg of lidocaine and was started on lidocaine drip. After starting the lidocaine, his VT significantly improved. Chest pain also improved.     No notes on file  Assessment & Plan    #) VT  RBBB  bradycardia in past H/o CAD, LHC with no new actionable lesions Updated echo with reduced EF to 30-35% Amiodarone gtt > PO yesterday. Has had ~4g amio total thus far Continue '400mg'$  BID until 10g loaded - needs '400mg'$  BID x 7 days, then '200mg'$  daily Lidocaine gtt > PO mexiletine 250 BID S/p CRT-D yesterday with Dr. Myles [name] - no immediate complications Device interrogation this AM Pending CXR Reviewed post-procedure arm restrictions and wound care. Directions placed in discharge instructions. Outpatient follow-up scheduled.  Patient  did not have syncope, but did require amiodarone and lidocaine to treat his VT.  I discussed with patient that per [facility] regulations, he can not drive for 6 months or until cleared by MD.   #) CAD Updated LHC , no interventions No CP  #) HFrEF Updated echo with reduced EF Due to ICM Plan per gen cards  #) CKD  Cr improving     For questions or updates, please contact [facility] Please consult [email or web address] for contact info under Cardiology/STEMI.  Signed, [name] Levers, NP  08/15/2022, 8:10 AM

## 2022-08-15 NOTE — Progress Notes (Signed)
Heart Failure Nurse Navigator Progress Note  PCP: Biagio Borg, MD PCP-Cardiologist: [name] Admission Diagnosis: Ventricular tachycardia Admitted from: Home via EMS  Presentation:   [name] [name] presented with chest pain and diaphoretic, found by EMS to be in [place]. 150 mg IV amiodarone given in route. BP 161/118, HR 82, BMI 40.90, Troponin 146, IV amiodarone drip started. Cath 3/4 showed multivessel CAD unchanged from 2020. EP placed ICD on 08/14/22.  Patient educated on the sign and symptoms of heart failure, daily weights, when to call his doctor or go to the ED, Diet/ fluid restrictions ( patient reported that he was eating out a lot, however plans on changing that),  taking all medications as prescribed and attending all medical appointments. Patient verbalized his understanding of education, a HF TOC appointment was scheduled for 09/05/2022 @ 3 pm.    ECHO/ LVEF: 30-35% G2DD  Clinical Course:  Past Medical History:  Diagnosis Date   CAD (coronary artery disease)    a. s/p BMS pRCA 2002; b. DES to pRCA & DES p/m RCA 2006; c. PCI/DES OM4 2007; d. PCI/CBA to RCA for ISR 10/2006; e.08/2012 STEMI/Cath/PCI:  LAD 95% >> PCI: Promus Prem DES  //  f. NSTEMI 10/15 >> LHC: mLAD stent ok, dLAD 70, OM1 CTO, OM2 stent ok, dOM2 90, OM3 30-40, dLCx 90, p-mRCA stent ok, mRCA stent ok w/ 60-70 ISR, EF 50% >> med Rx // MV 1/17: no ischemia // Cath ([facility]) 05/2019: RCA 100 (CTO)    Combined systolic and diastolic CHF    a. Myoview 1/17: EF 35%  //  b. Echo 1/17 EF 45-50% // Echo 05/2019: EF 50-55 // Echocardiogram 8/21: EF 50, inf AK, post and mid inf HK, mod LVH, RVSP 25.3, mild to mod LAE, trivial MR    Depression    Diabetes mellitus ([place])    a. A1c 8.8 08/2012->Metformin initiated. => b. A1c (9/14): 6.6   Gastroesophageal reflux disease    History of nuclear stress test    a. Myoview 1/17: EF 35%, fixed inferior lateral defect consistent with infarct, no ischemia, intermediate risk   History of  pneumonia    History of stroke 01/2021   Zio Monitor 11/22: NSR, avg HR 61; PVCs (1.9%); no AFib, no high grade HB, no ventricular arrhythmias   HTN (hypertension)    Hx of NSTEMI    Hyperlipidemia    Ischemic cardiomyopathy    a. EF 40%; improved to normal;  b. 08/2012 Echo: EF 50-55%, mod LVH.//  c. Echo 9/16: Inf HK, mild LVH, EF 55%, mild LAE, normal RVF, mild RAE, PASP 35 mmHg  //  d. Echo 1/17: EF 45-50%, inferior HK, mild BAE, PASP 33 mmHg   Obesity    OSA (obstructive sleep apnea)    Does not use CPAP as of 05/2011   Tobacco abuse      Social History   Socioeconomic History   Marital status: Single    Spouse name: Not on file   Number of children: 0   Years of education: Not on file   Highest education level: Not on file  Occupational History   Occupation: retired  Tobacco Use   Smoking status: Former    Packs/day: 0.50    Years: 30.00    Total pack years: 15.00    Types: Cigarettes   Smokeless tobacco: Never   Tobacco comments:    trying to cut down  Vaping Use   Vaping Use: Never used  Substance and Sexual Activity  Alcohol use: No   Drug use: No    Comment: remote marijuana use   Sexual activity: Not Currently  Other Topics Concern   Not on file  Social History Narrative   Lives alone.  He has no children.   He retired early due to health problems.         Social Determinants of Health   Financial Resource Strain: Low Risk  (09/20/2021)   Overall Financial Resource Strain (CARDIA)    Difficulty of Paying Living Expenses: Not hard at all  Food Insecurity: Food Insecurity Present (02/16/2022)   Hunger Vital Sign    Worried About Running Out of Food in the Last Year: Sometimes true    Ran Out of Food in the Last Year: Sometimes true  Transportation Needs: No Transportation Needs (02/16/2022)   PRAPARE - Hydrologist (Medical): No    Lack of Transportation (Non-Medical): No  Physical Activity: Inactive (09/20/2021)   Exercise  Vital Sign    Days of Exercise per Week: 0 days    Minutes of Exercise per Session: 0 min  Stress: No Stress Concern Present (09/20/2021)   [place]    Feeling of Stress : Not at all  Social Connections: Socially Isolated (09/20/2021)   Social Connection and Isolation Panel [NHANES]    Frequency of Communication with Friends and Family: More than three times a week    Frequency of Social Gatherings with Friends and Family: More than three times a week    Attends Religious Services: Never    Marine scientist or Organizations: No    Attends Music therapist: Never    Marital Status: Never married   Education Assessment and Provision:  Detailed education and instructions provided on heart failure disease management including the following:  Signs and symptoms of Heart Failure When to call the physician Importance of daily weights Low sodium diet Fluid restriction Medication management Anticipated future follow-up appointments  Patient education given on each of the above topics.  Patient acknowledges understanding via teach back method and acceptance of all instructions.  Education Materials:  "Living Better With Heart Failure" Booklet, HF zone tool, & Daily Weight Tracker Tool.  Patient has scale at home: Yes Patient has pill box at home: Yes    High Risk Criteria for Readmission and/or Poor Patient Outcomes: Heart failure hospital admissions (last 6 months): 0  No Show rate: 20% Difficult social situation: No Demonstrates medication adherence: Yes Primary Language: english Literacy level: reading, writing, and comprehension  Barriers of Care:   Diet/ fluid/ daily weights HF education  Considerations/Referrals:   Referral made to Heart Failure Pharmacist Stewardship: yes Referral made to Heart Failure CSW/NCM TOC: No Referral made to Heart & Vascular TOC clinic: yes, 3/27/202 @ 3  pm.  Items for Follow-up on DC/TOC: Continued HF education Diet/ fluids/ daily weights   Earnestine Leys, BSN, RN Heart Failure Transport planner Only

## 2022-08-15 NOTE — Progress Notes (Addendum)
OK to discharge from EP perspective  CXR without pneumothorax, stable lead placements  Pt should resume plavix 72h after device implantation = resume Saturday, 3/9

## 2022-08-15 NOTE — Progress Notes (Signed)
Consulted with cardiology fellow in regard to pt.'s blood pressure consistently reading above 160 despite giving PRN and scheduled medications. Informed him that patient usually takes Carvedilol BID at home and has not had it since he's been admitted. Patient is currently asymptomatic and denies any discomfort. Instructed to monitor patient for now and look into getting home medications added back on in the morning.

## 2022-08-15 NOTE — Discharge Instructions (Signed)
After Your Pacemaker   You have a [employer] Pacemaker  ACTIVITY Do not lift your arm above shoulder height for 1 week after your procedure. After 7 days, you may progress as below.  You should remove your sling 24 hours after your procedure, unless otherwise instructed by your provider.     Wednesday August 22, 2022  Thursday August 23, 2022 Friday August 24, 2022 Saturday August 25, 2022   Do not lift, push, pull, or carry anything over 10 pounds with the affected arm until 6 weeks (Wednesday September 26, 2022 ) after your procedure.   You may drive AFTER your wound check, unless you have been told otherwise by your provider.   Ask your healthcare provider when you can go back to work   INCISION/Dressing If you are on a blood thinner such as Coumadin, Xarelto, Eliquis, Plavix, or Pradaxa please confirm with your provider when this should be resumed.   If large square, outer bandage is left in place, this can be removed after 24 hours from your procedure. Do not remove steri-strips or glue as below.   Monitor your Pacemaker site for redness, swelling, and drainage. Call the device clinic at [phone] if you experience these symptoms or fever/chills.  If your incision is sealed with Steri-strips or staples, you may shower 7 days after your procedure or when told by your provider. Do not remove the steri-strips or let the shower hit directly on your site. You may wash around your site with soap and water.    If you were discharged in a sling, please do not wear this during the day more than 48 hours after your surgery unless otherwise instructed. This may increase the risk of stiffness and soreness in your shoulder.   Avoid lotions, ointments, or perfumes over your incision until it is well-healed.  You may use a hot tub or a pool AFTER your wound check appointment if the incision is completely closed.  Pacemaker Alerts:  Some alerts are vibratory and others beep. These are NOT emergencies.  Please call our office to let us know. If this occurs at night or on weekends, it can wait until the next business day. Send a remote transmission.  If your device is capable of reading fluid status (for heart failure), you will be offered monthly monitoring to review this with you.   DEVICE MANAGEMENT Remote monitoring is used to monitor your pacemaker from home. This monitoring is scheduled every 91 days by our office. It allows [place] to keep an eye on the functioning of your device to ensure it is working properly. You will routinely see your Electrophysiologist annually (more often if necessary).   You should receive your ID card for your new device in 4-8 weeks. Keep this card with you at all times once received. Consider wearing a medical alert bracelet or necklace.  Your Pacemaker may be MRI compatible. This will be discussed at your next office visit/wound check.  You should avoid contact with strong electric or magnetic fields.   Do not use amateur (ham) radio equipment or electric (arc) welding torches. MP3 player headphones with magnets should not be used. Some devices are safe to use if held at least 12 inches (30 cm) from your Pacemaker. These include power tools, lawn mowers, and speakers. If you are unsure if something is safe to use, ask your health care provider.  When using your cell phone, hold it to the ear that is on the opposite side from the  Pacemaker. Do not leave your cell phone in a pocket over the Pacemaker.  You may safely use electric blankets, heating pads, computers, and microwave ovens.  Call the office right away if: You have chest pain. You feel more short of breath than you have felt before. You feel more light-headed than you have felt before. Your incision starts to open up.  This information is not intended to replace advice given to you by your health care provider. Make sure you discuss any questions you have with your health care provider.

## 2022-08-15 NOTE — Progress Notes (Signed)
Rounding Note    Patient Name: [name] [name] Date of Encounter: 08/15/2022  [place] Cardiologist: Sherren Mocha, MD   Subjective   Patient feels well today.  He underwent BiV ICD implantation yesterday without cardiovascular sequelae.  No chest pain or shortness of breath.  Inpatient Medications    Scheduled Meds:  amiodarone  400 mg Oral BID   Followed by   Derrill Memo ON 08/22/2022] amiodarone  200 mg Oral Daily   amLODipine  5 mg Oral Daily   aspirin EC  81 mg Oral Daily   atorvastatin  80 mg Oral Daily   Chlorhexidine Gluconate Cloth  6 each Topical Daily   empagliflozin  10 mg Oral Daily   ezetimibe  10 mg Oral Daily   insulin aspart  0-15 Units Subcutaneous TID WC   insulin aspart  0-5 Units Subcutaneous QHS   isosorbide-hydrALAZINE  1 tablet Oral TID   mexiletine  250 mg Oral Q12H   potassium chloride  40 mEq Oral Daily   sodium chloride flush  3 mL Intravenous Q12H   sodium chloride flush  3 mL Intravenous Q12H   Continuous Infusions:  sodium chloride     PRN Meds: sodium chloride, acetaminophen, hydrALAZINE, nitroGLYCERIN, ondansetron (ZOFRAN) IV, sodium chloride flush   Vital Signs    Vitals:   08/15/22 0645 08/15/22 0700 08/15/22 0800 08/15/22 0826  BP: (!) 193/109 (!) 174/112    Pulse: 80 79 78   Resp: 20 (!) 28 (!) 27   Temp:    98.6 F (37 C)  TempSrc:    Oral  SpO2: 96% 96% 97%   Weight:      Height:        Intake/Output Summary (Last 24 hours) at 08/15/2022 0855 Last data filed at 08/15/2022 0800 Gross per 24 hour  Intake 772.07 ml  Output 900 ml  Net -127.93 ml      08/14/2022    8:00 AM 08/13/2022    6:00 AM 08/12/2022    6:00 AM  Last 3 Weights  Weight (lbs) 321 lb 14 oz 319 lb 10.7 oz 319 lb 3.6 oz  Weight (kg) 146 kg 145 kg 144.8 kg      Telemetry    Sinus bradycardia at 78 - Personally Reviewed  ECG    08/13/2022 ECG (independently read by me): Sinus bradycardia at 52, RBBB, LAHB, Q III, QTc 509 msec  Physical Exam    BP (!) 174/112   Pulse 78   Temp 98.6 F (37 C) (Oral)   Resp (!) 27   Ht '6\' 1"'$  (1.854 m)   Wt (!) 146 kg   SpO2 97%   BMI 42.47 kg/m  General: Alert, oriented, no distress.  Skin: normal turgor, no rashes, warm and dry HEENT: Normocephalic, atraumatic. Pupils equal round and reactive to light; sclera anicteric; extraocular muscles intact;  Nose without nasal septal hypertrophy Mouth/Parynx benign; Mallinpatti scale 4 Neck: No JVD, no carotid bruits; normal carotid upstroke Lungs: clear to ausculatation and percussion; no wheezing or rales Chest wall: without tenderness to palpitation Heart: PMI not displaced, RRR, s1 s2 normal, 1/6 systolic murmur, no diastolic murmur, no rubs, gallops, thrills, or heaves Abdomen: soft, nontender; no hepatosplenomehaly, BS+; abdominal aorta nontender and not dilated by palpation. Back: no CVA tenderness Pulses 2+ Musculoskeletal: full range of motion, normal strength, no joint deformities Extremities: Left arm in sling following BiV ICD implantation yesterday Neurologic: grossly nonfocal; Cranial nerves grossly wnl Psychologic: Normal mood and affect  Labs    High Sensitivity Troponin:   Recent Labs  Lab 08/11/22 1755 08/11/22 2030  TROPONINIHS 51* 146*     Chemistry Recent Labs  Lab 08/11/22 1755 08/12/22 0211 08/13/22 0535 08/14/22 0144 08/15/22 0355  NA 139   < > 135 136 139  K 4.1   < > 3.4* 3.7 3.8  CL 102   < > 100 108 110  CO2 23   < > 24 22 20*  GLUCOSE 147*   < > 328* 108* 111*  BUN 25*   < > '23 21 16  '$ CREATININE 1.76*   < > 1.50* 1.49* 1.39*  CALCIUM 9.6   < > 9.1 9.4 9.5  MG 2.2  --   --   --  2.2  PROT 7.4  --   --   --   --   ALBUMIN 3.8  --   --   --   --   AST 30  --   --   --   --   ALT 17  --   --   --   --   ALKPHOS 87  --   --   --   --   BILITOT 1.3*  --   --   --   --   GFRNONAA 42*   < > 51* 52* 56*  ANIONGAP 14   < > '11 6 9   '$ < > = values in this interval not displayed.    Lipids   Recent Labs  Lab 08/12/22 0211  CHOL 212*  TRIG 104  HDL 46  LDLCALC 145*  CHOLHDL 4.6    Hematology Recent Labs  Lab 08/11/22 1755 08/12/22 0211  WBC 8.1 6.5  RBC 5.97* 5.52  HGB 17.2* 16.3  HCT 52.9* 48.0  MCV 88.6 87.0  MCH 28.8 29.5  MCHC 32.5 34.0  RDW 16.0* 15.6*  PLT 193 184   Thyroid No results for input(s): "TSH", "FREET4" in the last 168 hours.  BNP Recent Labs  Lab 08/11/22 1755  BNP 95.3    DDimer No results for input(s): "DDIMER" in the last 168 hours.   Radiology    EP PPM/ICD IMPLANT  Result Date: 08/14/2022 Table formatting from the original result was not included.  SURGEON:  Doralee Albino, MD    PREPROCEDURE DIAGNOSES:  1. VT  2. CHF  3. RBBB  4. Sinus bradycardia    POSTPROCEDURE DIAGNOSES:  1. VT  2. CHF  3. RBBB  4. Sinus bradycardia    PROCEDURES:   1. Placement of a BiV ICD    INTRODUCTION: Herman Goldfeder is a 67 y.o. patient who presents to the EP lab for BiV ICD implantation.    DESCRIPTION OF PROCEDURE:  Informed written consent was obtained and the patient was brought to the electrophysiology lab in the fasting state. The patient was adequately sedated with intravenous Versed, and fentanyl as outlined in the nursing report.  The patient's left chest was prepped and draped in the usual sterile fashion by the EP lab staff.  The skin overlying the left deltopectoral region was infiltrated with lidocaine for local analgesia.  An incision was created over the left deltopectoral region.  A left subcutaneous pocket was fashioned using a combination of sharp and blunt dissection immediately superficial to the pectoral fascia.  Electrocautery was used to assure hemostasis. Lead Placement: The left axillary vein was cannulated using modified seldinger techniques. This was difficult. I obtained a venogram and then used  ultrasound to obtain access. Through the left axillary vein, an RV ICD lead was advanced to the RV apex under fluoroscopic guidance. Lead  parameters are detailed below. The lead was secured to the pectoral fascia with two loops of 0-Ethibond. Next, the coronary sinus was cannulated with a wholey wire via a 135 coronary sinus guide and the guide advanced into the coronary sinus. A CS venogram was obtained with approximately 35 ccs of contrast (total including axillary CS and axillary venograms) using a balloon-tipped wire. We selected a posterolateral branch for placement of the LV lead. The lead was advanced to the best branch over an 0.014 wire. Lead parameters were tested and are listed below. Next, the right atrial lead was advanced to the RA appendage. It was secured to the pectoral fascia with two loops of 0-Ethibond.  Lead parameters are detailed below. Finally, the coronary sinus guide was split and the lead secured to the pectoral fascia with two loops of 0-Ethibond.  RA RV LV Model 1231 [id number] Serial # [id number] Amplitude 2.8 mV 11.4 mV 23.2 mV Threshold 0.7 V @ 0.5 ms 0.6 V @ 0.5 ms 0.75 V @ 0.5 ms Impedence 526 ohms 740 ohms 1276 The pocket was irrigated with copious gentamicin solution.  The leads were then  connected to a pulse generator (model Sterling, serial [id number]). The pocket was closed in layers of absorbable suture. EBL < 79m. Steri-strips and a sterile dressing were applied.  During this procedure the patient is administered Versed and Fentanyl by a trained provider under my direct supervision to achieve and maintain moderate conscious sedation.  The patient's heart rate, blood pressure, and oxygen saturation are monitored continuously during the procedure. The period of conscious sedation is 1hr 57 minutes, of which I was present face-to-face 100% of this time.    CONCLUSIONS:  1. Successful implantation of a biventricular ICD  3.  No early apparent complications. ADoralee Albino MD Cardiac Electrophysiology  CARDIAC CATHETERIZATION  Result Date: 08/13/2022 Conclusions: Multivessel coronary artery  disease, including 99% in-stent restenosis of mid RCA stent with occlusion of distal RCA (may be due to competitive flow from left-to-right collaterals).  There is also 90% stenosis of the apical LAD and mild-moderate disease involving the more proximal LAD and LCx. Mildly elevated left ventricular filling pressure (LVEDP 20 mmHg). Tortuous right subclavian/brachiocephalic artery limiting catheter manipulation.  Consider alternate approach for future catheterizations. Recommendations: Severe RCA disease is unchanged since 2020 based [place]  Question if VT is related to scar from prior inferior MI. Continue medical therapy and aggressive secondary prevention.  Apical LAD stenosis is too distal for percutaneous intervention. Follow-up EP recommendations regarding management of sustained VT. CNelva Bush MD [facility]   Cardiac Studies    ECHO: 08/11/2022  1. Left ventricular ejection fraction, by estimation, is 30 to 35%. The  left ventricle has moderately decreased function. The left ventricle  demonstrates regional wall motion abnormalities (see scoring  diagram/findings for description). There is mild  left ventricular hypertrophy. Left ventricular diastolic parameters are  consistent with Grade II diastolic dysfunction (pseudonormalization).  Elevated left atrial pressure.   2. Right ventricular systolic function is normal. The right ventricular  size is normal. There is mildly elevated pulmonary artery systolic  pressure. The estimated right ventricular systolic pressure is 40000000mmHg.   3. Left atrial size was mildly dilated.   4. The mitral valve is normal in structure. Trivial mitral valve  regurgitation. No evidence of  mitral stenosis.   5. The aortic valve is tricuspid. Aortic valve regurgitation is not  visualized. No aortic stenosis is present.   6. The inferior vena cava is dilated in size with >50% respiratory  variability, suggesting right atrial pressure  of 8 mmHg.   FINDINGS   Left Ventricle: Left ventricular ejection fraction, by estimation, is 30  to 35%. The left ventricle has moderately decreased function. The left  ventricle demonstrates regional wall motion abnormalities. The left  ventricular internal cavity size was  normal in size. There is mild left ventricular hypertrophy. Left  ventricular diastolic parameters are consistent with Grade II diastolic  dysfunction (pseudonormalization). Elevated left atrial pressure.     LV Wall Scoring:  The mid and distal lateral wall, posterior wall, mid anterolateral  segment,  and basal inferior segment are akinetic. The mid and distal inferior wall,  basal anterolateral segment, and basal inferoseptal segment are  hypokinetic.  The entire anterior wall, entire anterior septum, mid inferoseptal  segment,  and apex are normal.     CARDIAC CATH: 08/12/2022 Conclusions: Multivessel coronary artery disease, including 99% in-stent restenosis of mid RCA stent with occlusion of distal RCA (may be due to competitive flow from left-to-right collaterals).  There is also 90% stenosis of the apical LAD and mild-moderate disease involving the more proximal LAD and LCx. Mildly elevated left ventricular filling pressure (LVEDP 20 mmHg). Tortuous right subclavian/brachiocephalic artery limiting catheter manipulation.  Consider alternate approach for future catheterizations.   Recommendations: Severe RCA disease is unchanged since 2020 based on cath report from [place].  Question if VT is related to scar from prior inferior MI. Continue medical therapy and aggressive secondary prevention.  Apical LAD stenosis is too distal for percutaneous intervention. Follow-up EP recommendations regarding management of sustained VT.     Patient Profile     67 y.o. male 67 y.o. male with a hx of CAD, type II diabetes, ischemic cardiomyopathy, PVCs who is being seen 08/11/2022 for the evaluation of ventricular  tachycardia at the request of Dr. Doren Custard.    Assessment & Plan    CAD: Patient underwent cardiac catheterization .  I personally reviewed the angiographic images which demonstrate severe RCA disease, unchanged from 2020 with patent proximal LAD stent, 90% apical LAD stenosis, and moderate stenosis in the left circumflex vessel.  Plan for medical therapy for his underlying CAD.  Continue DAPT with aspirin/clopidogrel. Ventricular tachycardia: No recurrence with patient now on oral amiodarone and mexiletine.  He underwent BiV ICD implantation yesterday by Dr. Myles Gip.  Ischemic cardiomyopathy.  EF 30 to 35% on echo with grade 2 diastolic dysfunction, and estimated RV pressure 40.3 mmHg.  He is on increased dose of isosorbide/hydralazine 3 times per day and Jardiance 10 mg daily.  Renal function has improved.  Will initiate Entresto 24/26 twice daily today as well as low-dose spironolactone 12.5 mg daily today.  If renal function remains stable, probably can further titrate. AKI: Initial creatinine 1.76 on admission.  Improved to 1.49 > 1.39 Hypertension: Patient was hypertensive last evening with systolic blood pressures in the 170s.  Amlodipine was added 5 mg.  This will be helpful with his CAD.  As we titrate Entresto, spironolactone, may not need amlodipine. Hypokalemia: Initially 3.1> 3.4 > 3.7 Hyperlipidemia with target less than 55.  LDL cholesterol 145.  Currently on atorvastatin 80 mg; Zetia 10 mg was added.  He may require future PCSK9 inhibition. Type 2 diabetes mellitus: Metformin initiated in 2014.  Currently he is  on sliding scale insulin. Obstructive sleep apnea: Patient remotely was diagnosed in 2012, his machine was taken away due to noncompliance.  I discussed with him the potential adverse cardiovascular consequences of untreated sleep apnea with reference to its effect on hypertension, nocturnal arrhythmias, increased risk for atrial fibrillation, negative effects with reference to insulin  resistance, increased inflammatory markers, nocturnal GERD and potential for nocturnal hypoxemia contributing to nocturnal ischemia.  I discussed the importance of reinitiating sleep evaluation postdischarge and he agrees to pursue this.  Will schedule him for an in lab split-night polysomnogram/CPAP titration evaluation. 9.  Obesity: BMI 42.2.  Importance of weight loss.  Patient is stable today.  Will transfer to 6 E cardiac telemetry today.   For questions or updates, please contact [place] Please consult [email or web address] for contact info under      Signed, Shelva Majestic, MD  08/15/2022, 8:55 AM

## 2022-08-16 LAB — BASIC METABOLIC PANEL
Anion gap: 9 (ref 5–15)
BUN: 13 mg/dL (ref 8–23)
CO2: 19 mmol/L — ABNORMAL LOW (ref 22–32)
Calcium: 9.4 mg/dL (ref 8.9–10.3)
Chloride: 111 mmol/L (ref 98–111)
Creatinine, Ser: 1.37 mg/dL — ABNORMAL HIGH (ref 0.61–1.24)
GFR, Estimated: 57 mL/min — ABNORMAL LOW (ref >60)
Glucose, Bld: 87 mg/dL (ref 70–99)
Potassium: 3.7 mmol/L (ref 3.5–5.1)
Sodium: 139 mmol/L (ref 135–145)

## 2022-08-16 LAB — GLUCOSE, CAPILLARY
Glucose-Capillary: 94 mg/dL (ref 70–99)
Glucose-Capillary: 99 mg/dL (ref 70–99)
Glucose-Capillary: 147 mg/dL — ABNORMAL HIGH (ref 70–99)
Glucose-Capillary: 111 mg/dL — ABNORMAL HIGH (ref 70–99)

## 2022-08-16 MED ORDER — SACUBITRIL-VALSARTAN 49-51 MG PO TABS
1.0000 | ORAL_TABLET | Freq: Two times a day (BID) | ORAL | Status: DC
  Administered 2022-08-16 – 2022-08-18 (×4): 1 via ORAL
  Filled 2022-08-16 (×4): qty 1

## 2022-08-16 MED ORDER — CARVEDILOL 6.25 MG PO TABS
6.2500 mg | ORAL_TABLET | Freq: Two times a day (BID) | ORAL | Status: DC
  Administered 2022-08-16 – 2022-08-18 (×4): 6.25 mg via ORAL
  Filled 2022-08-16 (×4): qty 1

## 2022-08-16 NOTE — Care Management Important Message (Signed)
Important Message  Patient Details  Name: Rollins Pusey MRN: [id number] Date of Birth: [birth year]   Medicare Important Message Given:  Yes     Shelda Altes 08/16/2022, 8:55 AM

## 2022-08-16 NOTE — Progress Notes (Signed)
   [name] Failure Stewardship Pharmacist Progress Note   PCP: Biagio Borg, [name] PCP-Cardiologist: Sherren Mocha, [name]    HPI:  67 yo M with PMH of [name] [name], [name] [name] [name], [name] [name] [name], [name] [name] [name], [name] [name] [name], [name] [name] [name], [name] [name] [name], [name] [name] [name], [name] [name] [name], [name] [name] [name], [name] [name] [name] received 150 mg bolus of amiodarone prior to [name] [name]. Started on amiodarone drip [name] then had sustained VT. [name] [name] was rebolused on amiodarone [name] started on a lidocaine drip. An ECHO was done on 3/3 [name] LVEF was reduced to 30-35% (was 45-50% in 2023)  with regional wall motion abnormalities, mild LVH, [name] RV normal. Taken for cath on 3/4 [name] multivessel [name] [name] is unchanged from 2020. LVEDP 20. EP following [name] s/p CRT-D implant on 3/5.  Current HF Medications: Beta Blocker: carvedilol 6.25 mg BID ACE/ARB/ARNI: Entresto 49/51 mg BID MRA: spironolactone 12.5 mg daily SGLT2i: Jardiance 10 mg daily Other: BiDil 1 tab TID  Prior to admission HF Medications: Diuretic: torsemide 20 mg daily Beta blocker: carvedilol 25 mg BID SGLT2i: Jardiance 25 mg daily Other: Imdur 30 mg daily   Pertinent Lab Values: Serum creatinine 1.37, BUN 13, Potassium 3.7, Sodium 139, BNP 95.3, Magnesium 2.2, A1c 7.3   Vital Signs: Weight: 321 lbs (admission weight: 319 lbs) Blood pressure: 150-170/90s  Heart rate: 70-80s  I/O: -0.7L yesterday; net -0.8L  Medication Assistance / Insurance Benefits Check: Does the [name] have prescription insurance?  Yes Type of insurance plan: Medicare + Medicaid  Outpatient Pharmacy:  Prior to admission outpatient pharmacy: [facility] Is the [name] willing to use [facility] at discharge? Yes Is the [name] willing to transition their outpatient pharmacy to utilize a [facility] outpatient pharmacy?   No    Assessment: 1. Acute on chronic systolic [name] [name] [name] (LVEF [id number]), due to ICM. NYHA class II symptoms. - Agree with adding carvedilol 6.25 mg BID, BiV ICD placed on 3/5 -  Agree with increasing to Entresto 49/51 mg BID  - Continue spironolactone 12.5 mg daily - Continue BiDil 1 tab TID - Continue Jardiance 10 mg daily - Agree with stopping amlodipine   Plan: 1) Medication changes recommended at this time: - Agree with changes  2) [name] assistance: - None pending  3)  Education  - [name] has been educated on current HF medications [name] potential additions to HF medication regimen - [name] verbalizes understanding that over the next few months, these medication doses may change [name] more medications may be added to optimize HF regimen - [name] has been educated on basic disease state pathophysiology [name] goals of therapy   Kerby Nora, PharmD, BCPS Heart Failure Cytogeneticist Phone [phone]

## 2022-08-16 NOTE — Plan of Care (Signed)
°  Problem: Education: °Goal: Understanding of cardiac disease, CV risk reduction, and recovery process will improve °Outcome: Progressing °Goal: Individualized Educational Video(s) °Outcome: Progressing °  °

## 2022-08-16 NOTE — Progress Notes (Addendum)
Rounding Note    Patient Name: [name] [name] Date of Encounter: 08/16/2022  [place] Cardiologist: [name] Mocha, MD   Subjective   Feeling well this morning. Device site looks good, just sore  Inpatient Medications    Scheduled Meds:  amiodarone  400 mg Oral BID   Followed by   Derrill Memo ON 08/22/2022] amiodarone  200 mg Oral Daily   amLODipine  5 mg Oral Daily   aspirin EC  81 mg Oral Daily   atorvastatin  80 mg Oral Daily   Chlorhexidine Gluconate Cloth  6 each Topical Daily   empagliflozin  10 mg Oral Daily   ezetimibe  10 mg Oral Daily   insulin aspart  0-15 Units Subcutaneous TID WC   insulin aspart  0-5 Units Subcutaneous QHS   isosorbide-hydrALAZINE  1 tablet Oral TID   mexiletine  250 mg Oral Q12H   sacubitril-valsartan  1 tablet Oral BID   sodium chloride flush  3 mL Intravenous Q12H   sodium chloride flush  3 mL Intravenous Q12H   spironolactone  12.5 mg Oral Daily   Continuous Infusions:  sodium chloride     PRN Meds: sodium chloride, acetaminophen, hydrALAZINE, nitroGLYCERIN, ondansetron (ZOFRAN) IV, sodium chloride flush   Vital Signs    Vitals:   08/15/22 2124 08/16/22 0018 08/16/22 0345 08/16/22 0750  BP: (!) 142/88 (!) 143/91 (!) 157/99 (!) 172/92  Pulse: 87 81 82 75  Resp:  '18 18 18  '$ Temp:  98.4 F (36.9 C) 98.6 F (37 C) 98.5 F (36.9 C)  TempSrc:  Oral Oral Oral  SpO2: 96% 96% 98% 97%  Weight:  (!) 145.8 kg (!) 145.8 kg   Height:        Intake/Output Summary (Last 24 hours) at 08/16/2022 1014 Last data filed at 08/15/2022 1715 Gross per 24 hour  Intake --  Output 400 ml  Net -400 ml      08/16/2022    3:45 AM 08/16/2022   12:18 AM 08/15/2022    8:00 AM  Last 3 Weights  Weight (lbs) 321 lb 6.9 oz 321 lb 6.9 oz 324 lb 9.6 oz  Weight (kg) 145.8 kg 145.8 kg 147.238 kg      Telemetry    Sinus Rhythm, V paced - Personally Reviewed  ECG    No new tracing  Physical Exam   GEN: No acute distress.   Neck: No  JVD Cardiac: RRR, no murmurs, rubs, or gallops. Device site stable.  Respiratory: Clear to auscultation bilaterally. GI: Soft, nontender, non-distended  MS: No edema; No deformity. Right radial cath site.  Neuro:  Nonfocal  Psych: Normal affect   Labs    High Sensitivity Troponin:   Recent Labs  Lab 08/11/22 1755 08/11/22 2030  TROPONINIHS 51* 146*     Chemistry Recent Labs  Lab 08/11/22 1755 08/12/22 0211 08/14/22 0144 08/15/22 0355 08/16/22 0242  NA 139   < > 136 139 139  K 4.1   < > 3.7 3.8 3.7  CL 102   < > 108 110 111  CO2 23   < > 22 20* 19*  GLUCOSE 147*   < > 108* 111* 87  BUN 25*   < > '21 16 13  '$ CREATININE 1.76*   < > 1.49* 1.39* 1.37*  CALCIUM 9.6   < > 9.4 9.5 9.4  MG 2.2  --   --  2.2  --   PROT 7.4  --   --   --   --  ALBUMIN 3.8  --   --   --   --   AST 30  --   --   --   --   ALT 17  --   --   --   --   ALKPHOS 87  --   --   --   --   BILITOT 1.3*  --   --   --   --   GFRNONAA 42*   < > 52* 56* 57*  ANIONGAP 14   < > '6 9 9   '$ < > = values in this interval not displayed.    Lipids  Recent Labs  Lab 08/12/22 0211  CHOL 212*  TRIG 104  HDL 46  LDLCALC 145*  CHOLHDL 4.6    Hematology Recent Labs  Lab 08/11/22 1755 08/12/22 0211  WBC 8.1 6.5  RBC 5.97* 5.52  HGB 17.2* 16.3  HCT 52.9* 48.0  MCV 88.6 87.0  MCH 28.8 29.5  MCHC 32.5 34.0  RDW 16.0* 15.6*  PLT 193 184   Thyroid No results for input(s): "TSH", "FREET4" in the last 168 hours.  BNP Recent Labs  Lab 08/11/22 1755  BNP 95.3    DDimer No results for input(s): "DDIMER" in the last 168 hours.   Radiology    DG Chest 2 View  Result Date: 08/15/2022 CLINICAL DATA:  Cardiac device insertion EXAM: CHEST - 2 VIEW COMPARISON:  08/11/2022 FINDINGS: Interval placement of a left subclavian pacemaker defibrillator with leads overlying the right atrium, right ventricle, and left ventricular venous outflow. Right basilar discoid atelectasis. Lungs are otherwise clear. No pneumothorax  or pleural effusion. Cardiac size within normal limits. Pulmonary vascularity is normal. No acute bone abnormality. IMPRESSION: 1. Interval placement of a left subclavian pacemaker defibrillator. No pneumothorax. Electronically Signed   By: [name] [name] M.D.   On: 08/15/2022 09:01   EP PPM/ICD IMPLANT  Result Date: 08/14/2022 Table formatting from the original result was not included.  SURGEON:  [name] Albino, MD    PREPROCEDURE DIAGNOSES:  1. VT  2. CHF  3. RBBB  4. Sinus bradycardia    POSTPROCEDURE DIAGNOSES:  1. VT  2. CHF  3. RBBB  4. Sinus bradycardia    PROCEDURES:   1. Placement of a BiV ICD    INTRODUCTION: [name] [name] is a 67 y.o. patient who presents to the EP lab for BiV ICD implantation.    DESCRIPTION OF PROCEDURE:  Informed written consent was obtained and the patient was brought to the electrophysiology lab in the fasting state. The patient was adequately sedated with intravenous Versed, and fentanyl as outlined in the nursing report.  The patient's left chest was prepped and draped in the usual sterile fashion by the EP lab staff.  The skin overlying the left deltopectoral region was infiltrated with lidocaine for local analgesia.  An incision was created over the left deltopectoral region.  A left subcutaneous pocket was fashioned using a combination of sharp and blunt dissection immediately superficial to the pectoral fascia.  Electrocautery was used to assure hemostasis. Lead Placement: The left axillary vein was cannulated using modified seldinger techniques. This was difficult. I obtained a venogram and then used ultrasound to obtain access. Through the left axillary vein, an RV ICD lead was advanced to the RV apex under fluoroscopic guidance. Lead parameters are detailed below. The lead was secured to the pectoral fascia with two loops of 0-Ethibond. Next, the coronary sinus was cannulated with a wholey wire via  a 135 coronary sinus guide and the guide advanced into the coronary  sinus. A CS venogram was obtained with approximately 35 ccs of contrast (total including axillary CS and axillary venograms) using a balloon-tipped wire. We selected a posterolateral branch for placement of the LV lead. The lead was advanced to the best branch over an 0.014 wire. Lead parameters were tested and are listed below. Next, the right atrial lead was advanced to the RA appendage. It was secured to the pectoral fascia with two loops of 0-Ethibond.  Lead parameters are detailed below. Finally, the coronary sinus guide was split and the lead secured to the pectoral fascia with two loops of 0-Ethibond.  RA RV LV Model [id number] [id number] Serial # [id number] Amplitude 2.8 mV 11.4 mV 23.2 mV Threshold 0.7 V @ 0.5 ms 0.6 V @ 0.5 ms 0.75 V @ 0.5 ms Impedence 526 ohms 740 ohms 1276 The pocket was irrigated with copious gentamicin solution.  The leads were then  connected to a pulse generator (model [place], serial [id number]). The pocket was closed in layers of absorbable suture. EBL < 54m. Steri-strips and a sterile dressing were applied.  During this procedure the patient is administered Versed and Fentanyl by a trained provider under my direct supervision to achieve and maintain moderate conscious sedation.  The patient's heart rate, blood pressure, and oxygen saturation are monitored continuously during the procedure. The period of conscious sedation is 1hr 57 minutes, of which I was present face-to-face 100% of this time.    CONCLUSIONS:  1. Successful implantation of a biventricular ICD  3.  No early apparent complications. ADoralee Albino MD Cardiac Electrophysiology   Cardiac Studies   ECHO: 08/11/2022  1. Left ventricular ejection fraction, by estimation, is 30 to 35%. The  left ventricle has moderately decreased function. The left ventricle  demonstrates regional wall motion abnormalities (see scoring  diagram/findings for description). There is mild  left ventricular hypertrophy.  Left ventricular diastolic parameters are  consistent with Grade II diastolic dysfunction (pseudonormalization).  Elevated left atrial pressure.   2. Right ventricular systolic function is normal. The right ventricular  size is normal. There is mildly elevated pulmonary artery systolic  pressure. The estimated right ventricular systolic pressure is 40000000mmHg.   3. Left atrial size was mildly dilated.   4. The mitral valve is normal in structure. Trivial mitral valve  regurgitation. No evidence of mitral stenosis.   5. The aortic valve is tricuspid. Aortic valve regurgitation is not  visualized. No aortic stenosis is present.   6. The inferior vena cava is dilated in size with >50% respiratory  variability, suggesting right atrial pressure of 8 mmHg.   FINDINGS   Left Ventricle: Left ventricular ejection fraction, by estimation, is 30  to 35%. The left ventricle has moderately decreased function. The left  ventricle demonstrates regional wall motion abnormalities. The left  ventricular internal cavity size was  normal in size. There is mild left ventricular hypertrophy. Left  ventricular diastolic parameters are consistent with Grade II diastolic  dysfunction (pseudonormalization). Elevated left atrial pressure.     LV Wall Scoring:  The mid and distal lateral wall, posterior wall, mid anterolateral  segment,  and basal inferior segment are akinetic. The mid and distal inferior wall,  basal anterolateral segment, and basal inferoseptal segment are  hypokinetic.  The entire anterior wall, entire anterior septum, mid inferoseptal  segment,  and apex are normal.        CARDIAC CATH:  08/12/2022 Conclusions: Multivessel coronary artery disease, including 99% in-stent restenosis of mid RCA stent with occlusion of distal RCA (may be due to competitive flow from left-to-right collaterals).  There is also 90% stenosis of the apical LAD and mild-moderate disease involving the more proximal  LAD and LCx. Mildly elevated left ventricular filling pressure (LVEDP 20 mmHg). Tortuous right subclavian/brachiocephalic artery limiting catheter manipulation.  Consider alternate approach for future catheterizations.   Recommendations: Severe RCA disease is unchanged since 2020 based on cath report from [facility].  Question if VT is related to scar from prior inferior MI. Continue medical therapy and aggressive secondary prevention.  Apical LAD stenosis is too distal for percutaneous intervention. Follow-up EP recommendations regarding management of sustained VT.     Patient Profile     67 y.o. male with a hx of CAD, type II diabetes, ischemic cardiomyopathy, PVCs who was seen 08/11/2022 for the evaluation of ventricular tachycardia at the request of Dr. Doren Custard.     Assessment & Plan    VT -- Presented with ventricular tachycardia.  He was placed on mexilitine as well as amiodarone (taper).  Seen by EP and underwent BiV ICD implantation with Dr. Myles Gip on 3/5.  CAD -- Underwent cardiac catheterization noted above with severe RCA disease of in-stent restenosis which is unchanged from 2020 as well as patent proximal LAD stent, apical 90% LAD stenosis and moderate stenosis in the left circumflex.  No targets for PCI.  Recommendations for continued medical therapy. -- Continue on DAPT with aspirin, Plavix (to be resumed 3/9), atorvastatin, Zetia  ICM HFrEF -- Echocardiogram showed LVEF of 30 to [id number], grade 2 diastolic dysfunction. -- GDMT: Will resume Coreg at 6.25 mg twice daily, increase Entresto to 41-51 mg twice daily, continue spironolactone 12.5 mg daily, Jardiance, BiDil 1 tablet 3 times daily. Will stop amlodipine to allow for titration of other guideline meds  Hyperlipidemia -- LDL 145 -- Continue atorvastatin 80 mg daily, Zetia 10 mg daily -- Will need lipid clinic referral as an outpatient  Diabetes -- Hemoglobin A1c 7.3 -- Continue SSI, Jardiance  OSA -- Previously on CPAP,  but for many years.  Will need outpatient sleep study  For questions or updates, please contact [place] Please consult [email or web address] for contact info under        Signed, Reino Bellis, NP  08/16/2022, 10:14 AM     Patient seen and examined. Agree with assessment and plan.  Patient feels well now on cardiac telemetry unit pain.  No shortness of breath.  Blood pressure remains elevated at 170/90.  Thick neck.  No JVD.  Lungs clear without wheezing.  ICD pocket stable.  Regular.  Telemetry reveals paced rhythm.  Abdomen nontender.  No edema.  Patient has tolerated initiation of low-dose Entresto yesterday.  Will titrate 49/51 mg today.  He is on [place].  BiDil dose was increased to 1 tablet 3 times a day yesterday.   He is tolerating initiation of spironolactone 12.5 mg daily.  Creatinine continues to improve today at 1.37.  LDL 145, he is now on atorvastatin 80 mg and Zetia 10 mg daily.  Goal less than 55.  To resume clopidogrel on 3 9, was on hold for ICD implantation.  I also contacted our sleep coordinator and we were scheduling him for an in lab split-night PSG/CPAP titration evaluation.   Troy Sine, MD, [facility] 08/16/2022 10:51 AM

## 2022-08-17 ENCOUNTER — Other Ambulatory Visit (HOSPITAL_COMMUNITY): Payer: Self-pay

## 2022-08-17 LAB — BASIC METABOLIC PANEL
Anion gap: 11 (ref 5–15)
BUN: 21 mg/dL (ref 8–23)
CO2: 17 mmol/L — ABNORMAL LOW (ref 22–32)
Calcium: 9.7 mg/dL (ref 8.9–10.3)
Chloride: 109 mmol/L (ref 98–111)
Creatinine, Ser: 1.45 mg/dL — ABNORMAL HIGH (ref 0.61–1.24)
GFR, Estimated: 53 mL/min — ABNORMAL LOW (ref >60)
Glucose, Bld: 100 mg/dL — ABNORMAL HIGH (ref 70–99)
Potassium: 4 mmol/L (ref 3.5–5.1)
Sodium: 137 mmol/L (ref 135–145)

## 2022-08-17 LAB — GLUCOSE, CAPILLARY
Glucose-Capillary: 96 mg/dL (ref 70–99)
Glucose-Capillary: 86 mg/dL (ref 70–99)
Glucose-Capillary: 110 mg/dL — ABNORMAL HIGH (ref 70–99)
Glucose-Capillary: 98 mg/dL (ref 70–99)

## 2022-08-17 LAB — CBC
HCT: 48.9 % (ref 39.0–52.0)
Hemoglobin: 15.9 g/dL (ref 13.0–17.0)
MCH: 28.9 pg (ref 26.0–34.0)
MCHC: 32.5 g/dL (ref 30.0–36.0)
MCV: 88.7 fL (ref 80.0–100.0)
Platelets: 158 10*3/uL (ref 150–400)
RBC: 5.51 MIL/uL (ref 4.22–5.81)
RDW: 15.5 % (ref 11.5–15.5)
WBC: 6.4 10*3/uL (ref 4.0–10.5)
nRBC: 0 % (ref 0.0–0.2)

## 2022-08-17 MED ORDER — ISOSORB DINITRATE-HYDRALAZINE 20-37.5 MG PO TABS
1.0000 | ORAL_TABLET | Freq: Three times a day (TID) | ORAL | 11 refills | Status: DC
  Filled 2022-08-17: qty 90, 30d supply, fill #0

## 2022-08-17 MED ORDER — SPIRONOLACTONE 25 MG PO TABS
12.5000 mg | ORAL_TABLET | Freq: Every day | ORAL | 11 refills | Status: DC
  Filled 2022-08-17: qty 30, 60d supply, fill #0

## 2022-08-17 MED ORDER — AMIODARONE HCL 200 MG PO TABS
ORAL_TABLET | ORAL | 10 refills | Status: DC
  Filled 2022-08-17: qty 60, 45d supply, fill #0

## 2022-08-17 MED ORDER — CARVEDILOL 6.25 MG PO TABS
6.2500 mg | ORAL_TABLET | Freq: Two times a day (BID) | ORAL | 11 refills | Status: DC
  Filled 2022-08-17: qty 60, 30d supply, fill #0

## 2022-08-17 MED ORDER — ASPIRIN 81 MG PO TBEC
81.0000 mg | DELAYED_RELEASE_TABLET | Freq: Every day | ORAL | 3 refills | Status: DC
  Filled 2022-08-17: qty 90, 90d supply, fill #0

## 2022-08-17 MED ORDER — MEXILETINE HCL 250 MG PO CAPS
250.0000 mg | ORAL_CAPSULE | Freq: Two times a day (BID) | ORAL | 11 refills | Status: DC
  Filled 2022-08-17: qty 60, 30d supply, fill #0

## 2022-08-17 MED ORDER — SACUBITRIL-VALSARTAN 49-51 MG PO TABS
1.0000 | ORAL_TABLET | Freq: Two times a day (BID) | ORAL | 11 refills | Status: DC
  Filled 2022-08-17: qty 60, 30d supply, fill #0

## 2022-08-17 MED ORDER — EZETIMIBE 10 MG PO TABS
10.0000 mg | ORAL_TABLET | Freq: Every day | ORAL | 11 refills | Status: DC
  Filled 2022-08-17: qty 30, 30d supply, fill #0

## 2022-08-17 NOTE — Progress Notes (Signed)
Mobility Specialist Progress Note:   08/17/22 1625  Mobility  Activity Ambulated with assistance in hallway  Level of Assistance Standby assist, set-up cues, supervision of patient - no hands on  Assistive Device None  Distance Ambulated (ft) 500 ft  Activity Response Tolerated well  Mobility Referral Yes  $Mobility charge 1 Mobility   During Mobility: HR 70s Post Mobility: HR 60bpm  Pt agreeable to mobility session, required no physical assistance. Pt c/o generalized weakness. VSS on RA. X1 standing rest break taken d/t fatigue. Encouraged frequent ambulation, pt left in chair with all needs met.   Nelta Numbers Mobility Specialist Please contact via SecureChat or  Rehab office at [phone]

## 2022-08-17 NOTE — Progress Notes (Signed)
   [facility] Failure Stewardship Pharmacist Progress Note   PCP: Biagio Borg, MD PCP-Cardiologist: Sherren Mocha, MD    HPI:  67 yo M with PMH of CAD, CHF, HTN, HLD, SVT, T2DM, OSA, CVA, asthma, and GERD.  Presented to the ED on 3/2 with chest pain and palpitations. On EMS arrival, patient was in VT. He received 150 mg bolus of amiodarone prior to arrival. Started on amiodarone drip and then had sustained VT. He was rebolused on amiodarone and started on a lidocaine drip. An ECHO was done on 3/3 and LVEF was reduced to 30-35% (was 45-50% in 2023)  with regional wall motion abnormalities, mild LVH, and RV normal. Taken for cath on 3/4 and multivessel CAD is unchanged from 2020. LVEDP 20. EP following and s/p CRT-D implant on 3/5.  Current HF Medications: Beta Blocker: carvedilol 6.25 mg BID ACE/ARB/ARNI: Entresto 49/51 mg BID MRA: spironolactone 12.5 mg daily SGLT2i: Jardiance 10 mg daily Other: BiDil 1 tab TID  Prior to admission HF Medications: Diuretic: torsemide 20 mg daily Beta blocker: carvedilol 25 mg BID SGLT2i: Jardiance 25 mg daily Other: Imdur 30 mg daily   Pertinent Lab Values: Serum creatinine 1.45, BUN 21, Potassium 4.0, Sodium 137, BNP 95.3, Magnesium 2.2, A1c 7.3   Vital Signs: Weight: 319 lbs (admission weight: 319 lbs) Blood pressure: 120-140/60-90s  Heart rate: 60s  I/O: incomplete yesterday; net -0.7L  Medication Assistance / Insurance Benefits Check: Does the patient have prescription insurance?  Yes Type of insurance plan: Medicare + Medicaid  Outpatient Pharmacy:  Prior to admission outpatient pharmacy: [facility] Is the patient willing to use [facility] at discharge? Yes Is the patient willing to transition their outpatient pharmacy to utilize a [facility] outpatient pharmacy?   No    Assessment: 1. Acute on chronic systolic CHF (LVEF 03-88%), due to ICM. NYHA class II symptoms. - Continue carvedilol 6.25 mg BID, BiV ICD placed on 3/5 -  Continue Entresto 49/51 mg BID  - Continue spironolactone 12.5 mg daily - Continue BiDil 1 tab TID - Continue Jardiance 10 mg daily - Agree with stopping amlodipine   Plan: 1) Medication changes recommended at this time: - Continue current regimen  2) Patient assistance: - None pending  3)  Education  - Patient has been educated on current HF medications and potential additions to HF medication regimen - Patient verbalizes understanding that over the next few months, these medication doses may change and more medications may be added to optimize HF regimen - Patient has been educated on basic disease state pathophysiology and goals of therapy   Kerby Nora, PharmD, BCPS Heart Failure Stewardship Pharmacist Phone [phone]

## 2022-08-17 NOTE — Progress Notes (Signed)
Ambulated 400 ft with pt on room air. Pt c/o short of breath midway but O2 sat remained at 99-100%. Pt denies chest pain.

## 2022-08-17 NOTE — Progress Notes (Addendum)
Rounding Note    Patient Name: [name] [name] Date of Encounter: 08/17/2022  [place] Cardiologist: Sherren Mocha, MD   Subjective   Patient is overall doing well. Device site appears stable. No chest pain reported.   Inpatient Medications    Scheduled Meds:  amiodarone  400 mg Oral BID   Followed by   Derrill Memo ON 08/22/2022] amiodarone  200 mg Oral Daily   aspirin EC  81 mg Oral Daily   atorvastatin  80 mg Oral Daily   carvedilol  6.25 mg Oral BID WC   Chlorhexidine Gluconate Cloth  6 each Topical Daily   empagliflozin  10 mg Oral Daily   ezetimibe  10 mg Oral Daily   insulin aspart  0-15 Units Subcutaneous TID WC   insulin aspart  0-5 Units Subcutaneous QHS   isosorbide-hydrALAZINE  1 tablet Oral TID   mexiletine  250 mg Oral Q12H   sacubitril-valsartan  1 tablet Oral BID   sodium chloride flush  3 mL Intravenous Q12H   sodium chloride flush  3 mL Intravenous Q12H   spironolactone  12.5 mg Oral Daily   Continuous Infusions:  sodium chloride     PRN Meds: sodium chloride, acetaminophen, hydrALAZINE, nitroGLYCERIN, ondansetron (ZOFRAN) IV, sodium chloride flush   Vital Signs    Vitals:   08/16/22 1640 08/16/22 2035 08/17/22 0459 08/17/22 0849  BP: (!) 161/93 131/85 124/69 (!) 149/93  Pulse: 73 64 60 60  Resp: '18 19 20 20  '$ Temp: 98.9 F (37.2 C) 98.5 F (36.9 C) 98.7 F (37.1 C) 97.6 F (36.4 C)  TempSrc: Oral Oral Oral Oral  SpO2: 91% 93% 97% 95%  Weight:   (!) 145 kg   Height:        Intake/Output Summary (Last 24 hours) at 08/17/2022 0917 Last data filed at 08/17/2022 0500 Gross per 24 hour  Intake 480 ml  Output 280 ml  Net 200 ml      08/17/2022    4:59 AM 08/16/2022    3:45 AM 08/16/2022   12:18 AM  Last 3 Weights  Weight (lbs) 319 lb 11.2 oz 321 lb 6.9 oz 321 lb 6.9 oz  Weight (kg) 145.015 kg 145.8 kg 145.8 kg      Telemetry    AV paced rhythm - Personally Reviewed  ECG    No new - Personally Reviewed  Physical Exam   GEN:  No acute distress.   Neck: No JVD Cardiac: RRR, no murmurs, rubs, or gallops.  Respiratory: Clear to auscultation bilaterally. GI: Soft, nontender, non-distended  MS: No edema; No deformity. Neuro:  Nonfocal  Psych: Normal affect   Labs    High Sensitivity Troponin:   Recent Labs  Lab 08/11/22 1755 08/11/22 2030  TROPONINIHS 51* 146*     Chemistry Recent Labs  Lab 08/11/22 1755 08/12/22 0211 08/15/22 0355 08/16/22 0242 08/17/22 0240  NA 139   < > 139 139 137  K 4.1   < > 3.8 3.7 4.0  CL 102   < > 110 111 109  CO2 23   < > 20* 19* 17*  GLUCOSE 147*   < > 111* 87 100*  BUN 25*   < > '16 13 21  '$ CREATININE 1.76*   < > 1.39* 1.37* 1.45*  CALCIUM 9.6   < > 9.5 9.4 9.7  MG 2.2  --  2.2  --   --   PROT 7.4  --   --   --   --  ALBUMIN 3.8  --   --   --   --   AST 30  --   --   --   --   ALT 17  --   --   --   --   ALKPHOS 87  --   --   --   --   BILITOT 1.3*  --   --   --   --   GFRNONAA 42*   < > 56* 57* 53*  ANIONGAP 14   < > '9 9 11   '$ < > = values in this interval not displayed.    Lipids  Recent Labs  Lab 08/12/22 0211  CHOL 212*  TRIG 104  HDL 46  LDLCALC 145*  CHOLHDL 4.6    Hematology Recent Labs  Lab 08/11/22 1755 08/12/22 0211  WBC 8.1 6.5  RBC 5.97* 5.52  HGB 17.2* 16.3  HCT 52.9* 48.0  MCV 88.6 87.0  MCH 28.8 29.5  MCHC 32.5 34.0  RDW 16.0* 15.6*  PLT 193 184   Lipid Panel     Component Value Date/Time   CHOL 212 (H) 08/12/2022 0211   CHOL 105 05/27/2020 0857   TRIG 104 08/12/2022 0211   HDL 46 08/12/2022 0211   HDL 48 05/27/2020 0857   CHOLHDL 4.6 08/12/2022 0211   VLDL 21 08/12/2022 0211   LDLCALC 145 (H) 08/12/2022 0211   LDLCALC 45 05/27/2020 0857   LABVLDL 12 05/27/2020 0857    Thyroid No results for input(s): "TSH", "FREET4" in the last 168 hours.  BNP Recent Labs  Lab 08/11/22 1755  BNP 95.3    DDimer No results for input(s): "DDIMER" in the last 168 hours.   Radiology    No results found.  Cardiac Studies    ECHO: 08/11/2022  1. Left ventricular ejection fraction, by estimation, is 30 to 35%. The  left ventricle has moderately decreased function. The left ventricle  demonstrates regional wall motion abnormalities (see scoring  diagram/findings for description). There is mild  left ventricular hypertrophy. Left ventricular diastolic parameters are  consistent with Grade II diastolic dysfunction (pseudonormalization).  Elevated left atrial pressure.   2. Right ventricular systolic function is normal. The right ventricular  size is normal. There is mildly elevated pulmonary artery systolic  pressure. The estimated right ventricular systolic pressure is 0000000 mmHg.   3. Left atrial size was mildly dilated.   4. The mitral valve is normal in structure. Trivial mitral valve  regurgitation. No evidence of mitral stenosis.   5. The aortic valve is tricuspid. Aortic valve regurgitation is not  visualized. No aortic stenosis is present.   6. The inferior vena cava is dilated in size with >50% respiratory  variability, suggesting right atrial pressure of 8 mmHg.   FINDINGS   Left Ventricle: Left ventricular ejection fraction, by estimation, is 30  to 35%. The left ventricle has moderately decreased function. The left  ventricle demonstrates regional wall motion abnormalities. The left  ventricular internal cavity size was  normal in size. There is mild left ventricular hypertrophy. Left  ventricular diastolic parameters are consistent with Grade II diastolic  dysfunction (pseudonormalization). Elevated left atrial pressure.     LV Wall Scoring:  The mid and distal lateral wall, posterior wall, mid anterolateral  segment,  and basal inferior segment are akinetic. The mid and distal inferior wall,  basal anterolateral segment, and basal inferoseptal segment are  hypokinetic.  The entire anterior wall, entire anterior septum, mid inferoseptal  segment,  and apex are normal.        CARDIAC  CATH: 08/12/2022 Conclusions: Multivessel coronary artery disease, including 99% in-stent restenosis of mid RCA stent with occlusion of distal RCA (may be due to competitive flow from left-to-right collaterals).  There is also 90% stenosis of the apical LAD and mild-moderate disease involving the more proximal LAD and LCx. Mildly elevated left ventricular filling pressure (LVEDP 20 mmHg). Tortuous right subclavian/brachiocephalic artery limiting catheter manipulation.  Consider alternate approach for future catheterizations.   Recommendations: Severe RCA disease is unchanged since 2020 based on cath report from [place].  Question if VT is related to scar from prior inferior MI. Continue medical therapy and aggressive secondary prevention.  Apical LAD stenosis is too distal for percutaneous intervention. Follow-up EP recommendations regarding management of sustained VT.       Patient Profile     67 y.o. male with a hx of CAD, type II diabetes, ischemic cardiomyopathy, PVCs who was seen 08/11/2022 for the evaluation of ventricular tachycardia at the request of Dr. Doren Custard.    Assessment & Plan    VT - s/p Biv ICD implantation 3/5 - amiodarone load  - mexilitine - Coreg -  EP will continue to follow  CAD - H/o multiple PCI procedures - cardiac cath 3/4 showed severe RCA disease of ISR which is unchanged from 2020 as well as patent pLAD stent, apical 90% LAD stenosis and moderate stenosis in the left Cx. No PCI targets - continue medical theray - DAPT with ASA and Plavix, plan to be resumed 3/9 - continue Lipitor and Zetia - No chest pain reported  ICM HFrEF - Echo showed LVEF 30-35%, G2DD - continue Coreg 6.'25mg'$  BID - Entresto 41-51 mgBID - spironolactone 12.'5mg'$  aily - Jardiance '10mg'$  daily - Bidil TID - Appears euvolemic on exam  HLD - LDL 145 - continue Lipitor '80mg'$  daily and Zetia '10mg'$  daily  Diabetes - A1C 7.3  OSA - will need OP sleep study   - previously on  CPAP  For questions or updates, please contact [place] Please consult [email or web address] for contact info under        Signed, Cadence Ninfa Meeker, PA-C  08/17/2022, 9:17 AM      Patient seen and examined. Agree with assessment and plan.  Patient feels well.  No chest pain.  Has only gotten out of bed to go to the bathroom.  Will try to increase ambulation today.  Blood pressure continues to be moderately elevated but improved with increased Entresto dose to 49/51 mg twice a day.  He is tolerating spironolactone.  Critical today which was on hold for BiV ICD implant.  Telemetry now shows paced rhythm.  Ambulate today.  Monitor blood pressure and heart rate.  Continue aggressive lipid-lowering therapy on atorvastatin 80 mg and Zetia added.  LDL was 145 on March 3.if unable to reach target, will need to initiate PCSK9 inhibition.  I have contacted our sleep coordinator who should have scheduled him for an outpatient split-night PSG/CPAP study.  Anticipate discharge tomorrow.   Troy Sine, MD, [facility] 08/17/2022 11:06 AM

## 2022-08-17 NOTE — TOC Transition Note (Signed)
Discharge medications (8) are being stored in the main pharmacy on the ground floor until patient is ready for discharge.   

## 2022-08-17 NOTE — Plan of Care (Signed)
  Problem: Education: Goal: Understanding of cardiac disease, CV risk reduction, and recovery process will improve Outcome: Progressing   

## 2022-08-18 LAB — BASIC METABOLIC PANEL
Anion gap: 8 (ref 5–15)
BUN: 20 mg/dL (ref 8–23)
CO2: 18 mmol/L — ABNORMAL LOW (ref 22–32)
Calcium: 9.7 mg/dL (ref 8.9–10.3)
Chloride: 111 mmol/L (ref 98–111)
Creatinine, Ser: 1.34 mg/dL — ABNORMAL HIGH (ref 0.61–1.24)
GFR, Estimated: 59 mL/min — ABNORMAL LOW (ref >60)
Glucose, Bld: 82 mg/dL (ref 70–99)
Potassium: 3.6 mmol/L (ref 3.5–5.1)
Sodium: 137 mmol/L (ref 135–145)

## 2022-08-18 LAB — GLUCOSE, CAPILLARY: Glucose-Capillary: 95 mg/dL (ref 70–99)

## 2022-08-18 NOTE — Discharge Summary (Signed)
Discharge Summary    Patient ID: [name] [name] MRN: [id number]; DOB: 1955/10/03  Admit date: 08/11/2022 Discharge date: 08/18/2022  PCP:  Biagio Borg, MD   [place] Providers Cardiologist:  Sherren Mocha, MD  Cardiology APP:  Liliane Shi, PA-C       Discharge Diagnoses    Principal Problem:   Ventricular tachycardia [facility]) Active Problems:   Non-ST elevation (NSTEMI) myocardial infarction [facility])    Diagnostic Studies/Procedures    08/12/22 TTE  IMPRESSIONS     1. Left ventricular ejection fraction, by estimation, is 30 to 35%. The  left ventricle has moderately decreased function. The left ventricle  demonstrates regional wall motion abnormalities (see scoring  diagram/findings for description). There is mild  left ventricular hypertrophy. Left ventricular diastolic parameters are  consistent with Grade II diastolic dysfunction (pseudonormalization).  Elevated left atrial pressure.   2. Right ventricular systolic function is normal. The right ventricular  size is normal. There is mildly elevated pulmonary artery systolic  pressure. The estimated right ventricular systolic pressure is 0000000 mmHg.   3. Left atrial size was mildly dilated.   4. The mitral valve is normal in structure. Trivial mitral valve  regurgitation. No evidence of mitral stenosis.   5. The aortic valve is tricuspid. Aortic valve regurgitation is not  visualized. No aortic stenosis is present.   6. The inferior vena cava is dilated in size with >50% respiratory  variability, suggesting right atrial pressure of 8 mmHg.   FINDINGS   Left Ventricle: Left ventricular ejection fraction, by estimation, is 30  to 35%. The left ventricle has moderately decreased function. The left  ventricle demonstrates regional wall motion abnormalities. The left  ventricular internal cavity size was  normal in size. There is mild left ventricular hypertrophy. Left  ventricular diastolic parameters are  consistent with Grade II diastolic  dysfunction (pseudonormalization). Elevated left atrial pressure.     LV Wall Scoring:  The mid and distal lateral wall, posterior wall, mid anterolateral  segment,  and basal inferior segment are akinetic. The mid and distal inferior wall,  basal anterolateral segment, and basal inferoseptal segment are  hypokinetic.  The entire anterior wall, entire anterior septum, mid inferoseptal  segment,  and apex are normal.   Right Ventricle: The right ventricular size is normal. No increase in  right ventricular wall thickness. Right ventricular systolic function is  normal. There is mildly elevated pulmonary artery systolic pressure. The  tricuspid regurgitant velocity is 2.84   m/s, and with an assumed right atrial pressure of 8 mmHg, the estimated  right ventricular systolic pressure is 0000000 mmHg.   Left Atrium: Left atrial size was mildly dilated.   Right Atrium: Right atrial size was normal in size.   Pericardium: There is no evidence of pericardial effusion.   Mitral Valve: The mitral valve is normal in structure. Trivial mitral  valve regurgitation. No evidence of mitral valve stenosis.   Tricuspid Valve: The tricuspid valve is normal in structure. Tricuspid  valve regurgitation is mild.   Aortic Valve: The aortic valve is tricuspid. Aortic valve regurgitation is  not visualized. No aortic stenosis is present.   Pulmonic Valve: The pulmonic valve was not well visualized. Pulmonic valve  regurgitation is not visualized.   Aorta: The aortic root and ascending aorta are structurally normal, with  no evidence of dilitation.   Venous: The inferior vena cava is dilated in size with greater than 50%  respiratory variability, suggesting right atrial pressure  of 8 mmHg.   IAS/Shunts: The interatrial septum was not well visualized.    08/13/22 LHC  Conclusions: Multivessel coronary artery disease, including 99% in-stent restenosis of mid  RCA stent with occlusion of distal RCA (may be due to competitive flow from left-to-right collaterals).  There is also 90% stenosis of the apical LAD and mild-moderate disease involving the more proximal LAD and LCx. Mildly elevated left ventricular filling pressure (LVEDP 20 mmHg). Tortuous right subclavian/brachiocephalic artery limiting catheter manipulation.  Consider alternate approach for future catheterizations.   Recommendations: Severe RCA disease is unchanged since 2020 based on cath report from [place].  Question if VT is related to scar from prior inferior MI. Continue medical therapy and aggressive secondary prevention.  Apical LAD stenosis is too distal for percutaneous intervention. Follow-up EP recommendations regarding management of sustained VT.  08/14/22 ICD placement   PREPROCEDURE DIAGNOSES:   1. VT  2. CHF  3. RBBB  4. Sinus bradycardia     POSTPROCEDURE DIAGNOSES:   1. VT  2. CHF  3. RBBB  4. Sinus bradycardia  CONCLUSIONS:   1. Successful implantation of a biventricular ICD  3.  No early apparent complications.  _____________   History of Present Illness     [name] [name] is a 67 y.o. male with a hx of CAD, type II diabetes, ischemic cardiomyopathy, PVCs who cardiology was asked to see on 08/11/2022 for the evaluation of ventricular tachycardia. On the afternoon of 3/2 patient developed central chest discomfort following by sensation of racing heart beat. EMS was called and patient found to be in VT. Patient continued to have intermittent VT in the ED despite Amiodarone and this eventually progressed to sustained VT up to 2.5 minutes.  Hospital Course     Consultants: EP   Ventricular tachycardia  Patient presented with intermittent VT, progressing to sustained VT up to 2.5 min. Successfully managed with IV amiodarone, lidocaine initially, transitioned to oral amiodarone and mexiletine. Received Abbott BiV ICD on 3/5.  Continue Mexilitine '250mg'$   BID Amiodarone '400mg'$  BID x7 days (day 4 out of 7 today). Then '200mg'$  daily. Per DMV regulations, he cannot drive for 6 months or until cleared by MD.   CAD  Patient underwent LHC this admission due to new VT, found with severe RCA disease that is unchanged since 2020 based on cath report from [place]. There is also 90% stenosis of the apical LAD and mild-moderate disease involving the more proximal LAD and Lcx, felt to be too distal for PCI.  Continue medical management with ASA/Plavix Bidil 20-37.'5mg'$  TID Coreg 6.'25mg'$  BID  Chronic systolic heart failure Ischemic cardiomyopathy  TTE this admission with LVEF 30-35%. LVEDP 20 mmHg on LHC.   Continue GDMT: Coreg 6.'25mg'$  BID Entresto 41-'51mg'$  BID Spironolactone 12.'5mg'$  Jardiance '25mg'$  Bidil 20-37.'5mg'$  TID  Hyperlipidemia  Patient found with LDL 145. Zetia '10mg'$  daily added this admission. Patient to continue both Zetia '10mg'$  and Lipitor '80mg'$ . Will need repeat lipid panel in 6-8 weeks and may ultimately need PCSK9.   DM type II  A1c 7.3. Continue Jardiance '25mg'$    OSA  Per Dr. Claiborne Billings, sleep coordination to set up patient for outpatient split-night PSG/CPAP study.      Patient seen by Dr. Audie Box today, deemed stable for discharge.  Did the patient have an acute coronary syndrome (MI, NSTEMI, STEMI, etc) this admission?:  No  Did the patient have a percutaneous coronary intervention (stent / angioplasty)?:  No.        The patient will be scheduled for a TOC follow up appointment in the next 14 days.  A message has been sent to the [facility] and Scheduling Pool at the office where the patient should be seen for follow up.  _____________  Discharge Vitals Blood pressure (!) 158/98, pulse 66, temperature 98.9 F (37.2 C), temperature source Oral, resp. rate 16, height '6\' 1"'$  (1.854 m), weight (!) 145.2 kg, SpO2 99 %.  Filed Weights   08/16/22 0345 08/17/22 0459 08/18/22 0316  Weight: (!) 145.8 kg (!) 145 kg  (!) 145.2 kg    Labs & Radiologic Studies    CBC Recent Labs    08/17/22 1002  WBC 6.4  HGB 15.9  HCT 48.9  MCV 88.7  PLT 0000000   Basic Metabolic Panel Recent Labs    08/17/22 0240 08/18/22 0240  NA 137 137  K 4.0 3.6  CL 109 111  CO2 17* 18*  GLUCOSE 100* 82  BUN 21 20  CREATININE 1.45* 1.34*  CALCIUM 9.7 9.7   Liver Function Tests No results for input(s): "AST", "ALT", "ALKPHOS", "BILITOT", "PROT", "ALBUMIN" in the last 72 hours. No results for input(s): "LIPASE", "AMYLASE" in the last 72 hours. High Sensitivity Troponin:   Recent Labs  Lab 08/11/22 1755 08/11/22 2030  TROPONINIHS 51* 146*    BNP Invalid input(s): "POCBNP" D-Dimer No results for input(s): "DDIMER" in the last 72 hours. Hemoglobin A1C No results for input(s): "HGBA1C" in the last 72 hours. Fasting Lipid Panel No results for input(s): "CHOL", "HDL", "LDLCALC", "TRIG", "CHOLHDL", "LDLDIRECT" in the last 72 hours. Thyroid Function Tests No results for input(s): "TSH", "T4TOTAL", "T3FREE", "THYROIDAB" in the last 72 hours.  Invalid input(s): "FREET3" _____________  DG Chest 2 View  Result Date: 08/15/2022 CLINICAL DATA:  Cardiac device insertion EXAM: CHEST - 2 VIEW COMPARISON:  08/11/2022 FINDINGS: Interval placement of a left subclavian pacemaker defibrillator with leads overlying the right atrium, right ventricle, and left ventricular venous outflow. Right basilar discoid atelectasis. Lungs are otherwise clear. No pneumothorax or pleural effusion. Cardiac size within normal limits. Pulmonary vascularity is normal. No acute bone abnormality. IMPRESSION: 1. Interval placement of a left subclavian pacemaker defibrillator. No pneumothorax. Electronically Signed   By: Fidela Salisbury M.D.   On: 08/15/2022 09:01   EP PPM/ICD IMPLANT  Result Date: 08/14/2022 Table formatting from the original result was not included.  SURGEON:  Doralee Albino, MD    PREPROCEDURE DIAGNOSES:  1. VT  2. CHF  3. RBBB  4.  Sinus bradycardia    POSTPROCEDURE DIAGNOSES:  1. VT  2. CHF  3. RBBB  4. Sinus bradycardia    PROCEDURES:   1. Placement of a BiV ICD    INTRODUCTION: [name] [name] is a 67 y.o. patient who presents to the EP lab for BiV ICD implantation.    DESCRIPTION OF PROCEDURE:  Informed written consent was obtained and the patient was brought to the electrophysiology lab in the fasting state. The patient was adequately sedated with intravenous Versed, and fentanyl as outlined in the nursing report.  The patient's left chest was prepped and draped in the usual sterile fashion by the EP lab staff.  The skin overlying the left deltopectoral region was infiltrated with lidocaine for local analgesia.  An incision was created over the left deltopectoral region.  A left subcutaneous pocket was fashioned using a combination  of sharp and blunt dissection immediately superficial to the pectoral fascia.  Electrocautery was used to assure hemostasis. Lead Placement: The left axillary vein was cannulated using modified seldinger techniques. This was difficult. I obtained a venogram and then used ultrasound to obtain access. Through the left axillary vein, an RV ICD lead was advanced to the RV apex under fluoroscopic guidance. Lead parameters are detailed below. The lead was secured to the pectoral fascia with two loops of 0-Ethibond. Next, the coronary sinus was cannulated with a wholey wire via a 135 coronary sinus guide and the guide advanced into the coronary sinus. A CS venogram was obtained with approximately 35 ccs of contrast (total including axillary CS and axillary venograms) using a balloon-tipped wire. We selected a posterolateral branch for placement of the LV lead. The lead was advanced to the best branch over an 0.014 wire. Lead parameters were tested and are listed below. Next, the right atrial lead was advanced to the RA appendage. It was secured to the pectoral fascia with two loops of 0-Ethibond.  Lead parameters are  detailed below. Finally, the coronary sinus guide was split and the lead secured to the pectoral fascia with two loops of 0-Ethibond.  RA RV LV Model 1231 [id number] Serial # [id number] Amplitude 2.8 mV 11.4 mV 23.2 mV Threshold 0.7 V @ 0.5 ms 0.6 V @ 0.5 ms 0.75 V @ 0.5 ms Impedence 526 ohms 740 ohms 1276 The pocket was irrigated with copious gentamicin solution.  The leads were then  connected to a pulse generator (model Tekonsha, serial [id number]). The pocket was closed in layers of absorbable suture. EBL < 6m. Steri-strips and a sterile dressing were applied.  During this procedure the patient is administered Versed and Fentanyl by a trained provider under my direct supervision to achieve and maintain moderate conscious sedation.  The patient's heart rate, blood pressure, and oxygen saturation are monitored continuously during the procedure. The period of conscious sedation is 1hr 57 minutes, of which I was present face-to-face 100% of this time.    CONCLUSIONS:  1. Successful implantation of a biventricular ICD  3.  No early apparent complications. ADoralee Albino MD Cardiac Electrophysiology  CARDIAC CATHETERIZATION  Result Date: 08/13/2022 Conclusions: Multivessel coronary artery disease, including 99% in-stent restenosis of mid RCA stent with occlusion of distal RCA (may be due to competitive flow from left-to-right collaterals).  There is also 90% stenosis of the apical LAD and mild-moderate disease involving the more proximal LAD and LCx. Mildly elevated left ventricular filling pressure (LVEDP 20 mmHg). Tortuous right subclavian/brachiocephalic artery limiting catheter manipulation.  Consider alternate approach for future catheterizations. Recommendations: Severe RCA disease is unchanged since 2020 based on cath report from [place]  Question if VT is related to scar from prior inferior MI. Continue medical therapy and aggressive secondary prevention.  Apical LAD stenosis is too  distal for percutaneous intervention. Follow-up EP recommendations regarding management of sustained VT. CNelva Bush MD [facility]  ECHOCARDIOGRAM COMPLETE  Result Date: 08/12/2022    ECHOCARDIOGRAM REPORT   Patient Name:   [name] [name] Date of Exam: 08/12/2022 Medical Rec #:  [id number]     Height:       73.0 in Accession #:    [id number]    Weight:       319.2 lb Date of Birth:  907-Jul-1957      BSA:          2.625 m Patient Age:  66 years       BP:           134/85 mmHg Patient Gender: M              HR:           53 bpm. Exam Location:  Inpatient Procedure: 2D Echo, Cardiac Doppler and Color Doppler Indications:    Ventricular Tachycardia  History:        Patient has prior history of Echocardiogram examinations, most                 recent 02/16/2022. Cardiomyopathy, CAD, chronic kidney disease,                 Arrythmias:PVC, Signs/Symptoms:Shortness of Breath; Risk                 Factors:Hypertension, Diabetes, Dyslipidemia, Sleep Apnea and                 Former Smoker.  Sonographer:    Johny Chess RDCS Referring Phys: [id number] Pooler CHRISTOPHER  Sonographer Comments: Patient is obese. Image acquisition challenging due to patient body habitus. IMPRESSIONS  1. Left ventricular ejection fraction, by estimation, is 30 to 35%. The left ventricle has moderately decreased function. The left ventricle demonstrates regional wall motion abnormalities (see scoring diagram/findings for description). There is mild left ventricular hypertrophy. Left ventricular diastolic parameters are consistent with Grade II diastolic dysfunction (pseudonormalization). Elevated left atrial pressure.  2. Right ventricular systolic function is normal. The right ventricular size is normal. There is mildly elevated pulmonary artery systolic pressure. The estimated right ventricular systolic pressure is 0000000 mmHg.  3. Left atrial size was mildly dilated.  4. The mitral valve is normal in structure. Trivial mitral valve  regurgitation. No evidence of mitral stenosis.  5. The aortic valve is tricuspid. Aortic valve regurgitation is not visualized. No aortic stenosis is present.  6. The inferior vena cava is dilated in size with >50% respiratory variability, suggesting right atrial pressure of 8 mmHg. FINDINGS  Left Ventricle: Left ventricular ejection fraction, by estimation, is 30 to 35%. The left ventricle has moderately decreased function. The left ventricle demonstrates regional wall motion abnormalities. The left ventricular internal cavity size was normal in size. There is mild left ventricular hypertrophy. Left ventricular diastolic parameters are consistent with Grade II diastolic dysfunction (pseudonormalization). Elevated left atrial pressure.  LV Wall Scoring: The mid and distal lateral wall, posterior wall, mid anterolateral segment, and basal inferior segment are akinetic. The mid and distal inferior wall, basal anterolateral segment, and basal inferoseptal segment are hypokinetic. The entire anterior wall, entire anterior septum, mid inferoseptal segment, and apex are normal. Right Ventricle: The right ventricular size is normal. No increase in right ventricular wall thickness. Right ventricular systolic function is normal. There is mildly elevated pulmonary artery systolic pressure. The tricuspid regurgitant velocity is 2.84  m/s, and with an assumed right atrial pressure of 8 mmHg, the estimated right ventricular systolic pressure is 0000000 mmHg. Left Atrium: Left atrial size was mildly dilated. Right Atrium: Right atrial size was normal in size. Pericardium: There is no evidence of pericardial effusion. Mitral Valve: The mitral valve is normal in structure. Trivial mitral valve regurgitation. No evidence of mitral valve stenosis. Tricuspid Valve: The tricuspid valve is normal in structure. Tricuspid valve regurgitation is mild. Aortic Valve: The aortic valve is tricuspid. Aortic valve regurgitation is not visualized.  No aortic stenosis is present. Pulmonic Valve: The pulmonic valve was not well  visualized. Pulmonic valve regurgitation is not visualized. Aorta: The aortic root and ascending aorta are structurally normal, with no evidence of dilitation. Venous: The inferior vena cava is dilated in size with greater than 50% respiratory variability, suggesting right atrial pressure of 8 mmHg. IAS/Shunts: The interatrial septum was not well visualized.  LEFT VENTRICLE PLAX 2D LVIDd:         5.60 cm      Diastology LVIDs:         5.00 cm      LV e' medial:    5.87 cm/s LV PW:         1.20 cm      LV E/e' medial:  14.1 LV IVS:        1.10 cm      LV e' lateral:   5.66 cm/s LVOT diam:     2.20 cm      LV E/e' lateral: 14.6 LV SV:         77 LV SV Index:   29 LVOT Area:     3.80 cm  LV Volumes (MOD) LV vol d, MOD A2C: 135.0 ml LV vol d, MOD A4C: 188.0 ml LV vol s, MOD A2C: 100.0 ml LV vol s, MOD A4C: 113.0 ml LV SV MOD A2C:     35.0 ml LV SV MOD A4C:     188.0 ml LV SV MOD BP:      53.0 ml RIGHT VENTRICLE             IVC RV Basal diam:  3.70 cm     IVC diam: 2.40 cm RV S prime:     10.10 cm/s TAPSE (M-mode): 1.9 cm LEFT ATRIUM              Index        RIGHT ATRIUM           Index LA diam:        4.50 cm  1.71 cm/m   RA Area:     23.60 cm LA Vol (A2C):   116.0 ml 44.20 ml/m  RA Volume:   74.90 ml  28.54 ml/m LA Vol (A4C):   72.8 ml  27.74 ml/m LA Biplane Vol: 93.2 ml  35.51 ml/m  AORTIC VALVE LVOT Vmax:   103.00 cm/s LVOT Vmean:  62.600 cm/s LVOT VTI:    0.203 m  AORTA Ao Root diam: 3.40 cm Ao Asc diam:  3.70 cm MITRAL VALVE               TRICUSPID VALVE MV Area (PHT): 3.68 cm    TV Peak grad:   29.8 mmHg MV Decel Time: 206 msec    TV Vmax:        2.73 m/s MV E velocity: 82.70 cm/s  TR Peak grad:   32.3 mmHg MV A velocity: 44.80 cm/s  TR Vmax:        284.00 cm/s MV E/A ratio:  1.85                            SHUNTS                            Systemic VTI:  0.20 m                            Systemic Diam: 2.20 cm Harrell Gave  Gardiner Rhyme MD Electronically signed by Oswaldo Milian MD Signature Date/Time: 08/12/2022/3:37:32 PM    Final    DG Chest Portable 1 View  Result Date: 08/11/2022 CLINICAL DATA:  Chest pain EXAM: PORTABLE CHEST 1 VIEW COMPARISON:  02/15/2022 FINDINGS: Cardiac shadow is enlarged but stable. Mild central vascular congestion is seen without focal infiltrate or effusion. No bony abnormality is noted. IMPRESSION: Mild central vascular congestion.  No edema is seen. Electronically Signed   By: Inez Catalina M.D.   On: 08/11/2022 19:06   Disposition   Pt is being discharged home today in good condition.  Follow-up Plans & Appointments     Follow-up Information     [facility]. Go in 14 day(s).   Specialty: Cardiology Why: Hospital follow up 09/05/2022 @ 3 pm Please bring a current medication list to appointment FREE valet parking, Entrance C, off [employer] information: [address] [id number] [name]        Emmaline Life, NP Follow up on 08/27/2022.   Specialty: Cardiology Why: Follow up appointment at [facility]. office on 08/27/22 '@2'$ :20pm. Contact information: [address] [place] [address] [phone]                Discharge Instructions     AMB referral to Phase II Cardiac Rehabilitation   Complete by: As directed    Diagnosis: NSTEMI   After initial evaluation and assessments completed: Virtual Based Care may be provided alone or in conjunction with Phase 2 Cardiac Rehab based on patient barriers.: Yes   Intensive Cardiac Rehabilitation (ICR) [place] location only OR Traditional Cardiac Rehabilitation (TCR) *If criteria for ICR are not met will enroll in TCR [facility] only): Yes        Discharge Medications   Allergies as of 08/18/2022       Reactions   Penicillin G Other (See Comments)   Did it involve swelling of the face/tongue/throat, SOB,  or low BP?  N/A Did it involve sudden or severe rash/hives, skin peeling, or any reaction on the inside of your mouth or nose? N/A Did you need to seek medical attention at a hospital or doctor's office? N/A When did it last happen? Child If all above answers are "NO", may proceed with cephalosporin use.   Penicillins Other (See Comments)   Did it involve swelling of the face/tongue/throat, SOB, or low BP? N/A Did it involve sudden or severe rash/hives, skin peeling, or any reaction on the inside of your mouth or nose?N/A Did you need to seek medical attention at a hospital or doctor's office? N/A When did it last happen? Child      If all above answers are "NO", may proceed with cephalosporin use.        Medication List     STOP taking these medications    isosorbide mononitrate 30 MG 24 hr tablet Commonly known as: IMDUR   potassium chloride 10 MEQ tablet Commonly known as: KLOR-CON   torsemide 20 MG tablet Commonly known as: DEMADEX       TAKE these medications    albuterol 108 (90 Base) MCG/ACT inhaler Commonly known as: VENTOLIN HFA Inhale 2 puffs into the lungs every 6 (six) hours as needed for wheezing or shortness of breath.   amiodarone 200 MG tablet Commonly known as: Pacerone Take 2 tablets ('400mg'$ ) by mouth twice daily then on 3/13 begin 1 tablet once daily  Aspirin Low Dose 81 MG tablet Generic drug: aspirin EC Take 1 tablet (81 mg total) by mouth daily. Swallow whole.   atorvastatin 80 MG tablet Commonly known as: LIPITOR TAKE 1 TABLET(80 MG) BY MOUTH AT BEDTIME What changed: See the new instructions.   B-D SINGLE USE SWABS BUTTERFLY Pads Use up to 4 times a day to test blood sugars. Dx: E10.9, E11.9   carvedilol 6.25 MG tablet Commonly known as: COREG Take 1 tablet (6.25 mg total) by mouth 2 (two) times daily with a meal. What changed:  medication strength See the new instructions.   clopidogrel 75 MG tablet Commonly known as: PLAVIX TAKE  1 TABLET(75 MG) BY MOUTH DAILY What changed: See the new instructions.   cyanocobalamin 1000 MCG tablet Commonly known as: VITAMIN B12 Take 1 tablet (1,000 mcg total) by mouth daily.   dorzolamide-timolol 2-0.5 % ophthalmic solution Commonly known as: COSOPT SMARTSIG:In Eye(s)   empagliflozin 25 MG Tabs tablet Commonly known as: Jardiance Take 1 tablet (25 mg total) by mouth daily before breakfast.   Entresto 49-51 MG Generic drug: sacubitril-valsartan Take 1 tablet by mouth 2 (two) times daily.   ezetimibe 10 MG tablet Commonly known as: ZETIA Take 1 tablet (10 mg total) by mouth daily.   gabapentin 300 MG capsule Commonly known as: NEURONTIN Take 1 capsule (300 mg total) by mouth 3 (three) times daily as needed. What changed: reasons to take this   isosorbide-hydrALAZINE 20-37.5 MG tablet Commonly known as: BIDIL Take 1 tablet by mouth 3 (three) times daily.   Lancets Misc Use as directed four times per day E11.9   mexiletine 250 MG capsule Commonly known as: MEXITIL Take 1 capsule (250 mg total) by mouth every 12 (twelve) hours.   nitroGLYCERIN 0.4 MG SL tablet Commonly known as: NITROSTAT PLACE 1 TABLET UNDER THE TONGUE EVERY 5 MINUTES AS NEEDED FOR CHEST PAIN What changed: See the new instructions.   OneTouch Verio [employer] 1 Device by Does not apply route in the morning, at noon, in the evening, and at bedtime. E11.9   OneTouch Verio test strip Generic drug: glucose blood Use as instructed four times per day E11.9   pantoprazole 40 MG tablet Commonly known as: PROTONIX Take 1 tablet (40 mg total) by mouth daily.   PARoxetine 20 MG tablet Commonly known as: PAXIL TAKE 1 TABLET(20 MG) BY MOUTH DAILY What changed:  how much to take how to take this when to take this additional instructions   spironolactone 25 MG tablet Commonly known as: ALDACTONE Take 1/2 tablet (12.5 mg total) by mouth daily.   tirzepatide 2.5 MG/0.5ML Pen Commonly  known as: MOUNJARO Inject 2.5 mg into the skin once a week. E11.9   True Metrix Level 1 Low Soln Use as needed. Dx: E10.9, E11.9           Outstanding Labs/Studies     Duration of Discharge Encounter   Greater than 30 minutes including physician time.  SignedLily Kocher, PA-C 08/18/2022, 8:12 AM

## 2022-08-18 NOTE — Care Management (Signed)
Spoke with Mr. Rumler prior to discharge. Denies needing assist for transportation home. Denies having any further TOC needs.   Marthenia Rolling, MSN, RN,BSN Inpatient [facility] Case Manager [phone]

## 2022-08-20 ENCOUNTER — Telehealth: Payer: Self-pay | Admitting: *Deleted

## 2022-08-20 ENCOUNTER — Encounter: Payer: Self-pay | Admitting: *Deleted

## 2022-08-20 NOTE — Transitions of Care (Post Inpatient/ED Visit) (Signed)
   08/20/2022  Name: [name] [name] MRN: [id number] DOB: 16-Apr-1956  Today's TOC FU Call Status: Today's TOC FU Call Status:: Unsuccessul Call (1st Attempt) Unsuccessful Call (1st Attempt) Date: 08/20/22  Attempted to reach the patient regarding the most recent Inpatient visit; left HIPAA compliant voice message requesting call back  Follow Up Plan: Additional outreach attempts will be made to reach the patient to complete the Transitions of Care (Post Inpatient visit) call.   [name], RN, BSN, CCRN Alumnus RN CM Care Coordination/ Transition of [place] Management [phone]: direct office

## 2022-08-21 ENCOUNTER — Telehealth: Payer: Self-pay | Admitting: *Deleted

## 2022-08-21 ENCOUNTER — Encounter: Payer: Self-pay | Admitting: *Deleted

## 2022-08-21 ENCOUNTER — Other Ambulatory Visit: Payer: Self-pay | Admitting: Internal Medicine

## 2022-08-21 ENCOUNTER — Ambulatory Visit: Payer: Self-pay

## 2022-08-21 NOTE — Chronic Care Management (AMB) (Signed)
   08/21/2022  [name] [name] Mar 09, 1956 [id number]  Reason for Encounter: Change in CCM enrollment status   Horris Latino RN Care Manager/Chronic Care Management [phone]

## 2022-08-21 NOTE — Transitions of Care (Post Inpatient/ED Visit) (Signed)
08/21/2022  Name: [name] [name] MRN: [id number] DOB: 10-03-1955  Today's TOC FU Call Status: Today's TOC FU Call Status:: Successful TOC FU Call Competed TOC FU Call Complete Date: 08/21/22  Transition Care Management Follow-up Telephone Call Date of Discharge: 08/18/22 Discharge Facility: [facility] [place]) Type of Discharge: Inpatient Admission Primary Inpatient Discharge Diagnosis:: V-tach with ICD placement How have you been since you were released from the hospital?: Better ("I am doing okay overall-- feeling better, but I am just so confused about some of these medications; thank you for going over them with me-- I feel better now") Any questions or concerns?: Yes Patient Questions/Concerns:: confusion around medications post-hospital discharge Patient Questions/Concerns Addressed: Other: (see interventions below; EXTENSIVE medication review including updating medication list; significant time spent going over new medications and corresponding instructions using teach back method; sent message to cardiology provider informing of same)  Items Reviewed: Did you receive and understand the discharge instructions provided?: Yes (thoroughly reviewed with patient who verbalizes fair understanding of same) Medications obtained and verified?: Yes (Medications Reviewed) (full medication review completed; confirmed patient obtained/ is taking all newly Rx'd medications as instructed-- with significant education/ clarification provided during TOC call; self-manages medications) Any new allergies since your discharge?: No Dietary orders reviewed?: Yes Type of Diet Ordered:: Heart Healthy, low Salt, diabetic Do you have support at home?: Yes People in Home: alone Name of Support/Comfort Primary Source: lives alone and reports he is independent in self-care activities; reports has church friends that assist him with understanding medications/ medical instructions-- pt. described himself as "functional  illiterate"  Home Care and Equipment/Supplies: [place] Ordered?: No Any new equipment or medical supplies ordered?: No  Functional Questionnaire: Do you need assistance with bathing/showering or dressing?: No Do you need assistance with meal preparation?: No Do you need assistance with eating?: No Do you have difficulty maintaining continence: No Do you need assistance with getting out of bed/getting out of a chair/moving?: No Do you have difficulty managing or taking your medications?: Yes (significant confusion around medications: thoroughly reviewed and re-reviewed with patient multiple times today; message sent to cardiology provider to inform of same, request cardiology pharmacy intervention)  Folllow up appointments reviewed: PCP Follow-up appointment confirmed?: NA (verified not indicated post- recent hospital discharging provider notes) [facility] Follow-up appointment confirmed?: Yes Date of Specialist follow-up appointment?: 08/27/22 Follow-Up Specialty Provider:: cardiology hospital follow up appointment- post ICD placement Do you need transportation to your follow-up appointment?: No Do you understand care options if your condition(s) worsen?: Yes-patient verbalized understanding  SDOH Interventions Today    Flowsheet Row Most Recent Value  SDOH Interventions   Food Insecurity Interventions Intervention Not Indicated  Transportation Interventions Intervention Not Indicated  [reports has a car, but his friends from church provide most of his transportation]      TOC Interventions Today    Flowsheet Row Most Recent Value  TOC Interventions   TOC Interventions Discussed/Reviewed TOC Interventions Discussed  [provided my direct contact information should questions/ concerns/ needs arise post-TOC call, prior to RN CM telephone visit]      Interventions Today    Flowsheet Row Most Recent Value  Chronic Disease   Chronic disease during  today's visit Other  [V-tach with ICD placement]  General Interventions   General Interventions Discussed/Reviewed General Interventions Discussed, Doctor Visits, Referral to Nurse, Communication with  Doctor Visits Discussed/Reviewed Doctor Visits Discussed, PCP, Specialist  PCP/Specialist Visits Compliance with follow-up visit  Communication with RN, PCP/Specialists  Education  Interventions   Education Provided Provided Education  Provided Verbal Education On Medication, When to see the doctor  Mental Health Interventions   Mental Health Discussed/Reviewed Mental Health Discussed, Depression  [depression screening completed: patient reports he has antidpressant medications but has not been taking due to "not feeling depressed, " medication list updated according to patient report today]  Nutrition Interventions   Nutrition Discussed/Reviewed Nutrition Discussed, Decreasing salt  [Heart healthy/ diabetic]  Pharmacy Interventions   Pharmacy Dicussed/Reviewed Pharmacy Topics Discussed, Pharmacy Topics Reviewed, Medications and their functions, Medication Adherence  [full medication review completed with request sent to cardiology provider requesting cardiology provider pharmacy intervention]      [name], RN, BSN, CCRN Alumnus RN CM Care Coordination/ Transition of [place] Management [phone]: direct office

## 2022-08-26 NOTE — Progress Notes (Unsigned)
Cardiology Office Note:    Date:  08/27/2022   ID:  [name] [name], DOB 10-27-1955, MRN [id number]  PCP:  Biagio Borg, MD   [place] Providers Cardiologist:  Sherren Mocha, MD Cardiology APP:  [facility]     Referring MD: Biagio Borg, MD   Chief Complaint: hospital follow-up  History of Present Illness:    [name] [name] is a pleasant 67 y.o. male with a hx of   Coronary artery disease S/p PCI to RCA and PL1 S/p cutting POBA to RCA 2/2 ISR in 5/08 S/p ant STEMI in 3/14 >> PCI: DES to LAD S/p NSTEMI in 6/15 >> PCI: DES to [place] S/p NSTEMI 10/15 >> Med Rx Myoview 1/17: no ischemia Cath at [facility] 05/2019: RCA 100 CTO Heart failure with preserved ejection fraction  Ischemic CM Echocardiogram 05/2019 ([facility]): EF 50-55 Echocardiogram 8/21: EF 50, inf AK Echo 9/23: EF 45-50, GR 1 DD Supraventricular tachycardia Monitor 9/21: Longest 17 seconds beta-blocker increased  PVCs (monitor 9/21: 6% burden) beta-blocker Rx  Hypertension Hyperlipidemia Chronic kidney disease Diabetes mellitus OSA GERD Tobacco abuse Aortic atherosclerosis  Hx of TIA in Aug 22  Last seen in cardiology clinic 02/27/2022 by [name], PA at which time he was doing well. Recent 10 pound weight loss on semaglutide. Reported chronic DOE felt to be multifactorial with chronic HFrEF, obesity, asthma, former smoker. Creatinine elevated during recent hospital admission, spironolactone continued to be held.   Admission 3/2-08/18/22.  He presented with central chest discomfort followed by racing heartbeats when laying down. No SOB, n/v, did feel somewhat diaphoretic. Called EMS, upon arrival patient was in VT.  He received 150 mg bolus of amiodarone.  While in ED he had intermittent episodes of NSVT and then began having sustained VT.  Longest episode in ED was 2.5 minutes of continuous VT ending at 6:04 PM.  This was on Amio drip.  He was rebolused with 150 mg amiodarone and given lidocaine. This  significantly improved his discomfort.  He was later transitioned to mexiletine.   TTE 08/12/22 revealed LVEF 30 to 35%, mild LVH, G2 DD, mildly elevated pulmonary artery systolic pressure. Left heart catheterization 08/13/22 revealed severe RCA disease unchanged since 2020. There is also 90% stenosis of the apical LAD and mild to moderate disease involving the more proximal LAD and LCx, felt to be too distal for PCI, LVEDP 20 mmHg. LDL was elevated at 145.  Zetia 10 mg daily was added to Lipitor 80 mg daily. He underwent BiV ICD placement on 08/13/2021. Plan at discharge to continue amiodarone 400 mg BID x 7 days then 200 mg daily, mexiletine 250 mg twice daily, aspirin, Plavix, carvedilol, Entresto, spironolactone, Jardiance, BiDil. Per Dr. Claiborne Billings, he would coordinate split-night sleep study for patient.  Today, reports he is overall feeling well. Feels off balance at times, no syncope, no falls. Reports shortness of breath when lying down. Has not increased number of pillows, no PND or edema. Feels palpitations when he wakes up in the morning but these have not been associated with lightheadedness or syncope.  He denies chest pain or dyspnea on exertion. Is working on improving his diet. Admits he ate out for most meals prior to hospitalization. Brought in his home medications today. No problems with medications but it has not been time for refills. He decreased amiodarone to 200 mg daily on 08/22/22 as advised. Wanted to clarify that he should be taking Plavix. Device pocket without s/s of infection, no  drainage, steri-strips in place.   Past Medical History:  Diagnosis Date   CAD (coronary artery disease)    a. s/p BMS pRCA 2002; b. DES to pRCA & DES p/m RCA 2006; c. PCI/DES OM4 2007; d. PCI/CBA to RCA for ISR 10/2006; e.08/2012 STEMI/Cath/PCI:  LAD 95% >> PCI: Promus Prem DES  //  f. NSTEMI 10/15 >> LHC: mLAD stent ok, dLAD 70, OM1 CTO, OM2 stent ok, dOM2 90, OM3 30-40, dLCx 90, p-mRCA stent ok, mRCA stent ok  w/ 60-70 ISR, EF 50% >> med Rx // MV 1/17: no ischemia // Cath ([facility]) 05/2019: RCA 100 (CTO)    Combined systolic and diastolic CHF    a. Myoview 1/17: EF 35%  //  b. Echo 1/17 EF 45-50% // Echo 05/2019: EF 50-55 // Echocardiogram 8/21: EF 50, inf AK, post and mid inf HK, mod LVH, RVSP 25.3, mild to mod LAE, trivial MR    Depression    Diabetes mellitus ([place])    a. A1c 8.8 08/2012->Metformin initiated. => b. A1c (9/14): 6.6   Gastroesophageal reflux disease    History of nuclear stress test    a. Myoview 1/17: EF 35%, fixed inferior lateral defect consistent with infarct, no ischemia, intermediate risk   History of pneumonia    History of stroke 01/2021   Zio Monitor 11/22: NSR, avg HR 61; PVCs (1.9%); no AFib, no high grade HB, no ventricular arrhythmias   HTN (hypertension)    Hx of NSTEMI    Hyperlipidemia    Ischemic cardiomyopathy    a. EF 40%; improved to normal;  b. 08/2012 Echo: EF 50-55%, mod LVH.//  c. Echo 9/16: Inf HK, mild LVH, EF 55%, mild LAE, normal RVF, mild RAE, PASP 35 mmHg  //  d. Echo 1/17: EF 45-50%, inferior HK, mild BAE, PASP 33 mmHg   Obesity    OSA (obstructive sleep apnea)    Does not use CPAP as of 05/2011   Tobacco abuse     Past Surgical History:  Procedure Laterality Date   BIV ICD INSERTION CRT-D N/A 08/14/2022   Procedure: BIV ICD INSERTION CRT-D;  Surgeon: Melida Quitter, MD;  Location: [place] CV [facility];  Service: Cardiovascular;  Laterality: N/A;   CORONARY ANGIOPLASTY WITH STENT PLACEMENT     CORONARY STENT PLACEMENT  2009   LEFT HEART CATH AND CORONARY ANGIOGRAPHY N/A 08/13/2022   Procedure: LEFT HEART CATH AND CORONARY ANGIOGRAPHY;  Surgeon: Nelva Bush, MD;  Location: [place] [facility];  Service: Cardiovascular;  Laterality: N/A;   LEFT HEART CATHETERIZATION WITH CORONARY ANGIOGRAM N/A 08/31/2012   Procedure: LEFT HEART CATHETERIZATION WITH CORONARY ANGIOGRAM;  Surgeon: Burnell Blanks, MD;  Location: [facility] CATH LAB;  Service:  Cardiovascular;  Laterality: N/A;   LEFT HEART CATHETERIZATION WITH CORONARY ANGIOGRAM N/A 11/16/2013   Procedure: LEFT HEART CATHETERIZATION WITH CORONARY ANGIOGRAM;  Surgeon: Blane Ohara, MD;  Location: [facility] CATH LAB;  Service: Cardiovascular;  Laterality: N/A;   LEFT HEART CATHETERIZATION WITH CORONARY ANGIOGRAM N/A 03/15/2014   Procedure: LEFT HEART CATHETERIZATION WITH CORONARY ANGIOGRAM;  Surgeon: Peter M Martinique, MD;  Location: [facility] CATH LAB;  Service: Cardiovascular;  Laterality: N/A;   PERCUTANEOUS CORONARY STENT INTERVENTION (PCI-S) Right 08/31/2012   Procedure: PERCUTANEOUS CORONARY STENT INTERVENTION (PCI-S);  Surgeon: Burnell Blanks, MD;  Location: [facility] CATH LAB;  Service: Cardiovascular;  Laterality: Right;   PERCUTANEOUS CORONARY STENT INTERVENTION (PCI-S)  11/16/2013   Procedure: PERCUTANEOUS CORONARY STENT INTERVENTION (PCI-S);  Surgeon: Blane Ohara,  MD;  Location: [place] CATH LAB;  Service: Cardiovascular;;    Current Medications: Current Meds  Medication Sig   albuterol (VENTOLIN HFA) 108 (90 Base) MCG/ACT inhaler Inhale 2 puffs into the lungs every 6 (six) hours as needed for wheezing or shortness of breath.   Alcohol Swabs (B-D SINGLE USE SWABS BUTTERFLY) PADS Use up to 4 times a day to test blood sugars. Dx: E10.9, E11.9   Blood Glucose Calibration (TRUE METRIX LEVEL 1) Low SOLN Use as needed. Dx: E10.9, E11.9   Blood Glucose Monitoring Suppl (ONETOUCH VERIO FLEX SYSTEM) DEVI 1 Device by Does not apply route in the morning, at noon, in the evening, and at bedtime. E11.9   dorzolamide-timolol (COSOPT) 2-0.5 % ophthalmic solution SMARTSIG:In Eye(s)   gabapentin (NEURONTIN) 300 MG capsule Take 1 capsule (300 mg total) by mouth 3 (three) times daily as needed. (Patient taking differently: Take 300 mg by mouth 3 (three) times daily as needed (For pain).)   glucose blood (ONETOUCH VERIO) test strip Use as instructed four times per day E11.9   Lancets MISC Use as directed four  times per day E11.9   mexiletine (MEXITIL) 250 MG capsule Take 1 capsule (250 mg total) by mouth every 12 (twelve) hours.   pantoprazole (PROTONIX) 40 MG tablet Take 1 tablet (40 mg total) by mouth daily.   PARoxetine (PAXIL) 20 MG tablet TAKE 1 TABLET(20 MG) BY MOUTH DAILY (Patient taking differently: Take 20 mg by mouth daily.)   spironolactone (ALDACTONE) 25 MG tablet Take 1 tablet (25 mg total) by mouth daily.   tirzepatide [facility]) 2.5 MG/0.5ML Pen Inject 2.5 mg into the skin once a week. E11.9   vitamin B-12 (CYANOCOBALAMIN) 1000 MCG tablet Take 1 tablet (1,000 mcg total) by mouth daily.   [DISCONTINUED] amiodarone (PACERONE) 200 MG tablet Take 2 tablets (400mg ) by mouth twice daily then on 3/13 begin 1 tablet once daily   [DISCONTINUED] amiodarone (PACERONE) 200 MG tablet Take 200 mg by mouth daily.   [DISCONTINUED] aspirin EC 81 MG tablet Take 1 tablet (81 mg total) by mouth daily. Swallow whole.   [DISCONTINUED] atorvastatin (LIPITOR) 80 MG tablet TAKE 1 TABLET(80 MG) BY MOUTH AT BEDTIME (Patient taking differently: Take 80 mg by mouth every evening.)   [DISCONTINUED] carvedilol (COREG) 6.25 MG tablet Take 1 tablet (6.25 mg total) by mouth 2 (two) times daily with a meal.   [DISCONTINUED] clopidogrel (PLAVIX) 75 MG tablet TAKE 1 TABLET(75 MG) BY MOUTH DAILY (Patient taking differently: Take 75 mg by mouth daily.)   [DISCONTINUED] ezetimibe (ZETIA) 10 MG tablet Take 1 tablet (10 mg total) by mouth daily.   [DISCONTINUED] isosorbide-hydrALAZINE (BIDIL) 20-37.5 MG tablet Take 1 tablet by mouth 3 (three) times daily.   [DISCONTINUED] JARDIANCE 25 MG TABS tablet TAKE 1 TABLET(25 MG) BY MOUTH DAILY BEFORE BREAKFAST   [DISCONTINUED] nitroGLYCERIN (NITROSTAT) 0.4 MG SL tablet PLACE 1 TABLET UNDER THE TONGUE EVERY 5 MINUTES AS NEEDED FOR CHEST PAIN (Patient taking differently: Place 0.4 mg under the tongue every 5 (five) minutes as needed for chest pain. N)   [DISCONTINUED] sacubitril-valsartan  (ENTRESTO) 49-51 MG Take 1 tablet by mouth 2 (two) times daily.   [DISCONTINUED] spironolactone (ALDACTONE) 25 MG tablet Take 1/2 tablet (12.5 mg total) by mouth daily.     Allergies:   Penicillin g and Penicillins   Social History   Socioeconomic History   Marital status: Single    Spouse name: Not on file   Number of children: 0   Years  of education: Not on file   Highest education level: 10th grade  Occupational History   Occupation: retired  Tobacco Use   Smoking status: Former    Packs/day: 0.50    Years: 30.00    Additional pack years: 0.00    Total pack years: 15.00    Types: Cigarettes   Smokeless tobacco: Never   Tobacco comments:    trying to cut down  Vaping Use   Vaping Use: Never used  Substance and Sexual Activity   Alcohol use: No   Drug use: No    Comment: remote marijuana use   Sexual activity: Not Currently  Other Topics Concern   Not on file  Social History Narrative   Lives alone.  He has no children.   He retired early due to health problems.         Social Determinants of Health   Financial Resource Strain: Low Risk  (08/15/2022)   Overall Financial Resource Strain (CARDIA)    Difficulty of Paying Living Expenses: Not very hard  Food Insecurity: No Food Insecurity (08/21/2022)   Hunger Vital Sign    Worried About Running Out of Food in the Last Year: Never true    Ran Out of Food in the Last Year: Never true  Transportation Needs: No Transportation Needs (08/21/2022)   PRAPARE - Hydrologist (Medical): No    Lack of Transportation (Non-Medical): No  Physical Activity: Inactive (09/20/2021)   Exercise Vital Sign    Days of Exercise per Week: 0 days    Minutes of Exercise per Session: 0 min  Stress: No Stress Concern Present (09/20/2021)   [place]    Feeling of Stress : Not at all  Social Connections: Socially Isolated (09/20/2021)   Social  Connection and Isolation Panel [NHANES]    Frequency of Communication with Friends and Family: More than three times a week    Frequency of Social Gatherings with Friends and Family: More than three times a week    Attends Religious Services: Never    Marine scientist or Organizations: No    Attends Music therapist: Never    Marital Status: Never married     Family History: The patient's family history includes Cancer in his mother; Coronary artery disease in an other family member; Depression in his mother; Hypertension in his mother; Other in his father. There is no history of Heart attack or Stroke.  ROS:   Please see the history of present illness.    + occasional lightheadedness + occasional palpitations All other systems reviewed and are negative.  Labs/Other Studies Reviewed:    The following studies were reviewed today:  Left heart catheterization 08/13/2022  Multivessel CAD including 99% ISR of mid RCA stent with occlusion of distal RCA entheses may be due to competitive flow from left to right collaterals) 90% stenosis of apical LAD and mild to moderate disease involving the more proximal LAD and LCx Mildly elevated LVEDP at 20 mmHg Severe RCA disease unchanged since 2020 based on cath report from [facility] Question if VT is related to scar from prior inferior MI Apical LAD stenosis too distal for PCI Continue medical therapy  Diagnostic Dominance: Right   ECHO COMPLETE WITH IMAGING ENHANCING AGENT 08/12/2022 Mid and distal lateral wall, posterior wall, mid anterior lateral segment and basal inferior segment akinetic EF 30-35, G2DD, elevated RVSF 40.3 mmHg, mild LAE, no significant valve abnormality  ECHO COMPLETE WITH IMAGING ENHANCING AGENT 02/16/2022 Inferobasal HK, EF 45-50, GR 1 DD, normal RVSF, mild LAE    CARDIAC TELEMETRY MONITORING-INTERPRETATION ONLY 04/05/2021 Summary: The basic rhythm is normal sinus with an average heart rate of 61 bpm.   There are occasional PVCs with an overall burden of 1.9%.  There are no prolonged ventricular arrhythmias.  There is no atrial fibrillation or flutter identified.  There is no high-grade AV block noted.    ECHOCARDIOGRAM 02/06/21 EF 45-50, global HK, moderate LVH, G1 DD, RVSP 15.1, mild LAE, trivial AI, dilated aortic root (37 mm)   ZIO monitor 02/2020 NSR, Avg HR 59; No AFib/Flutter; PVCs 6% burden; occ PACs, rare Supraventricular Tachycardia runs   Echocardiogram 01/14/20 EF 50, inf AK , post and inf HK, mod LVH, normal RVSF, RVSP 25.3, mild to mod LAE, trivial MR,    Cardiac catheterization 05/14/2019 ([facility]) LM normal LAD diff irregs LCx diffuse irregs RCA mid 100 (CTO)   Echocardiogram 05/13/2019 ([facility]) Mod conc LVH, inf AK and likely post HK, EF 50-55   Myoview 06/16/15 Intermediate risk stress nuclear study with a large, severe, predominantly fixed inferior lateral defect consistent with prior infarct; no significant ischemia; EF 35 but visually appears better; akinesis of the basal and mid inferior lateral wall; mild LVE; suggest echo to better assess LV function. Study intermediate risk due to reduced LV function.    Carotid US (4/13):  Bilateral: no ICA stenosis  Recent Labs: 10/27/2021: NT-Pro BNP 114 07/27/2022: TSH 1.70 08/11/2022: ALT 17; B Natriuretic Peptide 95.3 08/15/2022: Magnesium 2.2 08/17/2022: Hemoglobin 15.9; Platelets 158 08/18/2022: BUN 20; Creatinine, Ser 1.34; Potassium 3.6; Sodium 137  Recent Lipid Panel    Component Value Date/Time   CHOL 212 (H) 08/12/2022 0211   CHOL 105 05/27/2020 0857   TRIG 104 08/12/2022 0211   HDL 46 08/12/2022 0211   HDL 48 05/27/2020 0857   CHOLHDL 4.6 08/12/2022 0211   VLDL 21 08/12/2022 0211   LDLCALC 145 (H) 08/12/2022 0211   LDLCALC 45 05/27/2020 0857     Risk Assessment/Calculations:     Physical Exam:    VS:  BP (!) 164/100   Pulse 60   Ht 6\' 1"  (1.854 m)   Wt (!) 313 lb (142 kg)   SpO2 99%   BMI 41.30 kg/m     Wt  Readings from Last 3 Encounters:  08/27/22 (!) 313 lb (142 kg)  08/18/22 (!) 320 lb (145.2 kg)  07/27/22 (!) 321 lb (145.6 kg)     GEN: Obese, well developed in no acute distress HEENT: Normal NECK: No JVD; No carotid bruits CARDIAC: RRR, no murmurs, rubs, gallops RESPIRATORY:  Clear to auscultation without rales, wheezing or rhonchi  ABDOMEN: Soft, non-tender, non-distended MUSCULOSKELETAL:  No edema; No deformity. 2+ pedal pulses, equal bilaterally SKIN: Warm and dry, device pocket left chest w/o drainage, erythema NEUROLOGIC:  Alert and oriented x 3 PSYCHIATRIC:  Normal affect   EKG:  EKG is not ordered today.     HYPERTENSION CONTROL Vitals:   08/27/22 1446 08/27/22 1741  BP: (!) 150/100 (!) 164/100    The patient's blood pressure is elevated above target today.  In order to address the patient's elevated BP: A current anti-hypertensive medication was adjusted today.      Diagnoses:    1. Coronary artery disease involving native coronary artery of native heart without angina pectoris   2. Ventricular tachycardia ([place])   3. S/P implantation of automatic cardioverter/defibrillator (AICD)  4. Ischemic cardiomyopathy   5. Chronic HFrEF (heart failure with reduced ejection fraction) (HCC)   6. Medication management   7. Essential hypertension   8. Hyperlipidemia LDL goal <70   9. OSA (obstructive sleep apnea)   10. Stage 3a chronic kidney disease (HCC)    Assessment and Plan:     Ventricular tachycardia s/p ICD implant: New onset VT 08/11/22 managed medically with amiodarone and mexiletine. CRT-D implant 3/5. No problems since discharge, no shocks. No palpitations, chest discomfort, presyncope, syncope. Has wound clinic appointment in 2 days. Management per EP.   CAD without angina: Multivessel CAD with CTO RCA, scar from previous inferior MI could be culprit for VT, apical LAD 90% stenosis to distal for PCI. Feeling well today, he denies chest pain, dyspnea, or other  symptoms concerning for angina. No indication for further ischemic evaluation at this time. No bleeding problems. Continue DAPT with Plavix/ASA.  Continue additional GDMT including BiDil, ezetimibe, carvedilol, atorvastatin. Recheck fasting lipid panel in 8 weeks.   ICM/HFrEF: LVEF 30 to 35%, G2 DD on TTE 08/12/2022. Reports some shortness of breath when lying down but no increase in # of pillows and no PND. No LE edema. Denies dyspnea on exertion, early satiety. Body habitus makes volume status difficult to assess but he appears volume stable on exam. Reports he generally gets abdominal distention with fluid retention. Abdomen is soft and non-distended. Increasing spironolactone 2/2 hypertension. Tolerating medications well. Emphasized the importance of daily weight, 2L fluid restriction, and low sodium diet. Continue GDMT including clopidogrel, spironolactone, Entresto, BiDil, Jardiance.  Hypertension: BP is quite elevated today.  He has not taken spironolactone yet  Will go ahead and increase this from 12.5 mg to 25 mg daily. Will ask that BMP be collected in 1 week at Heart Failure clinic appointment.   CKD: SCr 1.34 at discharge 08/18/2022. Increasing spironolactone in the setting of hypertension. Will recheck BMP in 1 week.   Sleep apnea: Scheduled for split-night sleep study on 09/03/22. Management per Dr. Claiborne Billings.  Hyperlipidemia: LDL 145 on 08/12/2022. We will recheck in 8 weeks.  Continue ezetimibe, lovastatin.  Medication management: Lengthy discussion about the importance of medication compliance.  Advised him to contact us if he has financial concerns with medications or any other concerning symptoms or questions.    Cardiac Rehabilitation Eligibility Assessment  The patient is NOT ready to start cardiac rehabilitation due to: Needs EP and HF clearance     Disposition: Keep your appointments with: Device clinic 3/20, Heart Failure Clinic 3/27, Dr. Myles Gip 6/11 Dr. [name] or APP follow-up to  be determined    Medication Adjustments/Labs and Tests Ordered: Current medicines are reviewed at length with the patient today.  Concerns regarding medicines are outlined above.  No orders of the defined types were placed in this encounter.  Meds ordered this encounter  Medications   spironolactone (ALDACTONE) 25 MG tablet    Sig: Take 1 tablet (25 mg total) by mouth daily.    Dispense:  90 tablet    Refill:  3   amiodarone (PACERONE) 200 MG tablet    Sig: Take 1 tablet (200 mg total) by mouth daily.    Dispense:  90 tablet    Refill:  3   aspirin EC 81 MG tablet    Sig: Take 1 tablet (81 mg total) by mouth daily. Swallow whole.    Dispense:  90 tablet    Refill:  3   atorvastatin (LIPITOR) 80 MG tablet  Sig: Take 1 tablet (80 mg total) by mouth every evening.    Dispense:  90 tablet    Refill:  3   carvedilol (COREG) 6.25 MG tablet    Sig: Take 1 tablet (6.25 mg total) by mouth 2 (two) times daily with a meal.    Dispense:  180 tablet    Refill:  3   clopidogrel (PLAVIX) 75 MG tablet    Sig: Take 1 tablet (75 mg total) by mouth daily.    Dispense:  90 tablet    Refill:  3   ezetimibe (ZETIA) 10 MG tablet    Sig: Take 1 tablet (10 mg total) by mouth daily.    Dispense:  90 tablet    Refill:  3   isosorbide-hydrALAZINE (BIDIL) 20-37.5 MG tablet    Sig: Take 1 tablet by mouth 3 (three) times daily.    Dispense:  90 tablet    Refill:  3   empagliflozin (JARDIANCE) 25 MG TABS tablet    Sig: TAKE 1 TABLET(25 MG) BY MOUTH DAILY BEFORE BREAKFAST    Dispense:  90 tablet    Refill:  3   nitroGLYCERIN (NITROSTAT) 0.4 MG SL tablet    Sig: Place 1 tablet (0.4 mg total) under the tongue every 5 (five) minutes as needed for chest pain. N    Dispense:  25 tablet    Refill:  3   sacubitril-valsartan (ENTRESTO) 49-51 MG    Sig: Take 1 tablet by mouth 2 (two) times daily.    Dispense:  60 tablet    Refill:  11    Patient Instructions  Medication Instructions:  Your physician  has recommended you make the following change in your medication:   INCREASE the Spironolactone to 25 taking a whole pill daily   *If you need a refill on your cardiac medications before your next appointment, please call your pharmacy*   Lab Work: None ordered  If you have labs (blood work) drawn today and your tests are completely normal, you will receive your results only by: [place] (if you have MyChart) OR A paper copy in the mail If you have any lab test that is abnormal or we need to change your treatment, we will call you to review the results.   Testing/Procedures: None ordered   Follow-Up: At [facility], you and your health needs are our priority.  As part of our continuing mission to provide you with exceptional heart care, we have created designated Provider Care Teams.  These Care Teams include your primary Cardiologist (physician) and Advanced Practice Providers (APPs -  Physician Assistants and Nurse Practitioners) who all work together to provide you with the care you need, when you need it.  We recommend signing up for the patient portal called "MyChart".  Sign up information is provided on this After Visit Summary.  MyChart is used to connect with patients for Virtual Visits (Telemedicine).  Patients are able to view lab/test results, encounter notes, upcoming appointments, etc.  Non-urgent messages can be sent to your provider as well.   To learn more about what you can do with MyChart, go to [email or web address].    Your next appointment:   As scheduled   Provider:   Sherren Mocha, MD     Other Instructions    Signed, Emmaline Life, NP  08/27/2022 6:01 PM    [place]

## 2022-08-27 ENCOUNTER — Encounter: Payer: Self-pay | Admitting: Nurse Practitioner

## 2022-08-27 ENCOUNTER — Ambulatory Visit: Payer: 59 | Attending: Nurse Practitioner | Admitting: Nurse Practitioner

## 2022-08-27 VITALS — BP 164/100 | HR 60 | Ht 73.0 in | Wt 313.0 lb

## 2022-08-27 DIAGNOSIS — E785 Hyperlipidemia, unspecified: Secondary | ICD-10-CM

## 2022-08-27 DIAGNOSIS — G4733 Obstructive sleep apnea (adult) (pediatric): Secondary | ICD-10-CM

## 2022-08-27 DIAGNOSIS — I2510 Atherosclerotic heart disease of native coronary artery without angina pectoris: Secondary | ICD-10-CM | POA: Diagnosis not present

## 2022-08-27 DIAGNOSIS — I472 Ventricular tachycardia, unspecified: Secondary | ICD-10-CM

## 2022-08-27 DIAGNOSIS — Z95810 Presence of automatic (implantable) cardiac defibrillator: Secondary | ICD-10-CM

## 2022-08-27 DIAGNOSIS — Z79899 Other long term (current) drug therapy: Secondary | ICD-10-CM | POA: Diagnosis not present

## 2022-08-27 DIAGNOSIS — I255 Ischemic cardiomyopathy: Secondary | ICD-10-CM

## 2022-08-27 DIAGNOSIS — I5022 Chronic systolic (congestive) heart failure: Secondary | ICD-10-CM | POA: Diagnosis not present

## 2022-08-27 DIAGNOSIS — I10 Essential (primary) hypertension: Secondary | ICD-10-CM

## 2022-08-27 DIAGNOSIS — N1831 Chronic kidney disease, stage 3a: Secondary | ICD-10-CM | POA: Diagnosis not present

## 2022-08-27 MED ORDER — ISOSORB DINITRATE-HYDRALAZINE 20-37.5 MG PO TABS: 1.0000 | ORAL_TABLET | Freq: Three times a day (TID) | ORAL | 3 refills | Status: DC

## 2022-08-27 MED ORDER — EMPAGLIFLOZIN 25 MG PO TABS: ORAL_TABLET | ORAL | 3 refills | Status: DC

## 2022-08-27 MED ORDER — ASPIRIN 81 MG PO TBEC: 81.0000 mg | DELAYED_RELEASE_TABLET | Freq: Every day | ORAL | 3 refills | Status: AC

## 2022-08-27 MED ORDER — CARVEDILOL 6.25 MG PO TABS: 6.2500 mg | ORAL_TABLET | Freq: Two times a day (BID) | ORAL | 3 refills | Status: DC

## 2022-08-27 MED ORDER — EZETIMIBE 10 MG PO TABS: 10.0000 mg | ORAL_TABLET | Freq: Every day | ORAL | 3 refills | Status: DC

## 2022-08-27 MED ORDER — SPIRONOLACTONE 25 MG PO TABS: 25.0000 mg | ORAL_TABLET | Freq: Every day | ORAL | 3 refills | Status: DC

## 2022-08-27 MED ORDER — NITROGLYCERIN 0.4 MG SL SUBL: 0.4000 mg | SUBLINGUAL_TABLET | SUBLINGUAL | 3 refills | Status: AC | PRN

## 2022-08-27 MED ORDER — SACUBITRIL-VALSARTAN 49-51 MG PO TABS: 1.0000 | ORAL_TABLET | Freq: Two times a day (BID) | ORAL | 11 refills | Status: DC

## 2022-08-27 MED ORDER — CLOPIDOGREL BISULFATE 75 MG PO TABS: 75.0000 mg | ORAL_TABLET | Freq: Every day | ORAL | 3 refills | Status: DC

## 2022-08-27 MED ORDER — AMIODARONE HCL 200 MG PO TABS: 200.0000 mg | ORAL_TABLET | Freq: Every day | ORAL | 3 refills | Status: DC

## 2022-08-27 MED ORDER — ATORVASTATIN CALCIUM 80 MG PO TABS: 80.0000 mg | ORAL_TABLET | Freq: Every evening | ORAL | 3 refills | Status: DC

## 2022-08-27 NOTE — Patient Instructions (Signed)
Medication Instructions:  Your physician has recommended you make the following change in your medication:   INCREASE the Spironolactone to 25 taking a whole pill daily   *If you need a refill on your cardiac medications before your next appointment, please call your pharmacy*   Lab Work: None ordered  If you have labs (blood work) drawn today and your tests are completely normal, you will receive your results only by: [place] (if you have MyChart) OR A paper copy in the mail If you have any lab test that is abnormal or we need to change your treatment, we will call you to review the results.   Testing/Procedures: None ordered   Follow-Up: At [facility], you and your health needs are our priority.  As part of our continuing mission to provide you with exceptional heart care, we have created designated Provider Care Teams.  These Care Teams include your primary Cardiologist (physician) and Advanced Practice Providers (APPs -  Physician Assistants and Nurse Practitioners) who all work together to provide you with the care you need, when you need it.  We recommend signing up for the patient portal called "MyChart".  Sign up information is provided on this After Visit Summary.  MyChart is used to connect with patients for Virtual Visits (Telemedicine).  Patients are able to view lab/test results, encounter notes, upcoming appointments, etc.  Non-urgent messages can be sent to your provider as well.   To learn more about what you can do with MyChart, go to [email or web address].    Your next appointment:   As scheduled   Provider:   Sherren Mocha, MD     Other Instructions

## 2022-08-29 ENCOUNTER — Ambulatory Visit: Payer: 59 | Attending: Internal Medicine

## 2022-08-29 DIAGNOSIS — I255 Ischemic cardiomyopathy: Secondary | ICD-10-CM

## 2022-08-29 LAB — CUP PACEART INCLINIC DEVICE CHECK
Battery Remaining Longevity: 97 mo
Brady Statistic RA Percent Paced: 58 %
Brady Statistic RV Percent Paced: 98 %
Date Time Interrogation Session: 20240320104824
HighPow Impedance: 66.375
Implantable Lead Connection Status: 753985
Implantable Lead Connection Status: 753985
Implantable Lead Connection Status: 753985
Implantable Lead Implant Date: 20240305
Implantable Lead Implant Date: 20240305
Implantable Lead Implant Date: 20240305
Implantable Lead Location: 753858
Implantable Lead Location: 753859
Implantable Lead Location: 753860
Implantable Pulse Generator Implant Date: 20240305
Lead Channel Impedance Value: 1325 Ohm
Lead Channel Impedance Value: 450 Ohm
Lead Channel Impedance Value: 500 Ohm
Lead Channel Pacing Threshold Amplitude: 0.75 V
Lead Channel Pacing Threshold Amplitude: 0.75 V
Lead Channel Pacing Threshold Amplitude: 0.75 V
Lead Channel Pacing Threshold Amplitude: 0.75 V
Lead Channel Pacing Threshold Amplitude: 1 V
Lead Channel Pacing Threshold Amplitude: 1 V
Lead Channel Pacing Threshold Pulse Width: 0.5 ms
Lead Channel Pacing Threshold Pulse Width: 0.5 ms
Lead Channel Pacing Threshold Pulse Width: 0.5 ms
Lead Channel Pacing Threshold Pulse Width: 0.5 ms
Lead Channel Pacing Threshold Pulse Width: 0.5 ms
Lead Channel Pacing Threshold Pulse Width: 0.5 ms
Lead Channel Sensing Intrinsic Amplitude: 4.2 mV
Lead Channel Sensing Intrinsic Amplitude: 9.7 mV
Lead Channel Setting Pacing Amplitude: 1.625
Lead Channel Setting Pacing Amplitude: 1.625
Lead Channel Setting Pacing Amplitude: 1.875
Lead Channel Setting Pacing Pulse Width: 0.5 ms
Lead Channel Setting Pacing Pulse Width: 0.5 ms
Lead Channel Setting Sensing Sensitivity: 0.5 mV
Pulse Gen Serial Number: 810089146

## 2022-08-29 NOTE — Patient Instructions (Signed)
   After Your ICD (Implantable Cardiac Defibrillator)    Monitor your defibrillator site for redness, swelling, and drainage. Call the device clinic at [phone] if you experience these symptoms or fever/chills.  Your incision was closed with Steri-strips or staples:  You may shower 7 days after your procedure and wash your incision with soap and water. Avoid lotions, ointments, or perfumes over your incision until it is well-healed.  You may use a hot tub or a pool after your wound check appointment if the incision is completely closed.  Do not lift, push or pull greater than 10 pounds with the affected arm until 6 weeks after your procedure. UNTIL AFTER APRIL 16TH. There are no other restrictions in arm movement after your wound check appointment.   Your ICD is designed to protect you from life threatening heart rhythms. Because of this, you may receive a shock.   1 shock with no symptoms:  Call the office during business hours. 1 shock with symptoms (chest pain, chest pressure, dizziness, lightheadedness, shortness of breath, overall feeling unwell):  Call 911. If you experience 2 or more shocks in 24 hours:  Call 911. If you receive a shock, you should not drive.  [place] DMV - no driving for 6 months if you receive appropriate therapy from your ICD.   ICD Alerts:  Some alerts are vibratory and others beep. These are NOT emergencies. Please call our office to let us know. If this occurs at night or on weekends, it can wait until the next business day. Send a remote transmission.  If your device is capable of reading fluid status (for heart failure), you will be offered monthly monitoring to review this with you.   Remote monitoring is used to monitor your ICD from home. This monitoring is scheduled every 91 days by our office. It allows [place] to keep an eye on the functioning of your device to ensure it is working properly. You will routinely see your Electrophysiologist annually (more  often if necessary).

## 2022-08-29 NOTE — Progress Notes (Signed)
Wound check appointment/ CRT-D device.  Steri-strips removed. Wound without redness or edema. Incision edges approximated, wound well healed. Normal device function. BI-ventricularly pacing 98% Thresholds, sensing, and impedances consistent with implant measurements. Device programmed with cap confirm on as set at implant until 3 month visit. Histogram distribution appropriate for patient and level of activity. No mode switches or ventricular arrhythmias noted. Patient educated about wound care, arm mobility, lifting restrictions, shock plan. ROV in 3 months with implanting physician.

## 2022-08-31 ENCOUNTER — Telehealth: Payer: Self-pay | Admitting: *Deleted

## 2022-08-31 NOTE — Progress Notes (Unsigned)
  Care Coordination Note  08/31/2022 Name: [name] [name] MRN: [id number] DOB: 1956-01-19  [name] [name] is a 67 y.o. year old male who is a primary care patient of Biagio Borg, MD and is actively engaged with the care management team. I reached out to [facility] by phone today to assist with re-scheduling a follow up visit with the RN Case Manager  Follow up plan: Unsuccessful telephone outreach attempt made. A HIPAA compliant phone message was left for the patient providing contact information and requesting a return call.   Julian Hy, [place] Direct Dial: [phone]

## 2022-09-03 ENCOUNTER — Ambulatory Visit (HOSPITAL_BASED_OUTPATIENT_CLINIC_OR_DEPARTMENT_OTHER): Payer: 59 | Attending: Cardiovascular Disease | Admitting: Cardiovascular Disease

## 2022-09-03 VITALS — Ht 73.0 in | Wt 314.0 lb

## 2022-09-03 DIAGNOSIS — I110 Hypertensive heart disease with heart failure: Secondary | ICD-10-CM | POA: Diagnosis not present

## 2022-09-03 DIAGNOSIS — G4733 Obstructive sleep apnea (adult) (pediatric): Secondary | ICD-10-CM | POA: Diagnosis not present

## 2022-09-03 DIAGNOSIS — I5032 Chronic diastolic (congestive) heart failure: Secondary | ICD-10-CM | POA: Diagnosis not present

## 2022-09-03 DIAGNOSIS — G4736 Sleep related hypoventilation in conditions classified elsewhere: Secondary | ICD-10-CM | POA: Diagnosis not present

## 2022-09-03 DIAGNOSIS — I10 Essential (primary) hypertension: Secondary | ICD-10-CM

## 2022-09-03 DIAGNOSIS — I2510 Atherosclerotic heart disease of native coronary artery without angina pectoris: Secondary | ICD-10-CM | POA: Diagnosis not present

## 2022-09-05 ENCOUNTER — Encounter (HOSPITAL_COMMUNITY): Payer: Self-pay

## 2022-09-05 ENCOUNTER — Ambulatory Visit (HOSPITAL_COMMUNITY)
Admit: 2022-09-05 | Discharge: 2022-09-05 | Disposition: A | Discharge status: Home / Self Care | Payer: 59 | Attending: Adult Health | Admitting: Adult Health

## 2022-09-05 VITALS — BP 158/90 | HR 64 | Wt 313.2 lb

## 2022-09-05 DIAGNOSIS — I10 Essential (primary) hypertension: Secondary | ICD-10-CM

## 2022-09-05 DIAGNOSIS — N1831 Chronic kidney disease, stage 3a: Secondary | ICD-10-CM | POA: Insufficient documentation

## 2022-09-05 DIAGNOSIS — I5082 Biventricular heart failure: Secondary | ICD-10-CM | POA: Insufficient documentation

## 2022-09-05 DIAGNOSIS — E785 Hyperlipidemia, unspecified: Secondary | ICD-10-CM | POA: Insufficient documentation

## 2022-09-05 DIAGNOSIS — E669 Obesity, unspecified: Secondary | ICD-10-CM | POA: Diagnosis not present

## 2022-09-05 DIAGNOSIS — Z6841 Body Mass Index (BMI) 40.0 and over, adult: Secondary | ICD-10-CM | POA: Diagnosis not present

## 2022-09-05 DIAGNOSIS — Z955 Presence of coronary angioplasty implant and graft: Secondary | ICD-10-CM | POA: Diagnosis not present

## 2022-09-05 DIAGNOSIS — I5020 Unspecified systolic (congestive) heart failure: Secondary | ICD-10-CM | POA: Insufficient documentation

## 2022-09-05 DIAGNOSIS — Z7984 Long term (current) use of oral hypoglycemic drugs: Secondary | ICD-10-CM | POA: Diagnosis not present

## 2022-09-05 DIAGNOSIS — I130 Hypertensive heart and chronic kidney disease with heart failure and stage 1 through stage 4 chronic kidney disease, or unspecified chronic kidney disease: Secondary | ICD-10-CM | POA: Diagnosis not present

## 2022-09-05 DIAGNOSIS — Z7902 Long term (current) use of antithrombotics/antiplatelets: Secondary | ICD-10-CM | POA: Insufficient documentation

## 2022-09-05 DIAGNOSIS — I2510 Atherosclerotic heart disease of native coronary artery without angina pectoris: Secondary | ICD-10-CM

## 2022-09-05 DIAGNOSIS — Z79899 Other long term (current) drug therapy: Secondary | ICD-10-CM | POA: Diagnosis not present

## 2022-09-05 DIAGNOSIS — F1010 Alcohol abuse, uncomplicated: Secondary | ICD-10-CM | POA: Insufficient documentation

## 2022-09-05 DIAGNOSIS — E1122 Type 2 diabetes mellitus with diabetic chronic kidney disease: Secondary | ICD-10-CM | POA: Insufficient documentation

## 2022-09-05 DIAGNOSIS — I472 Ventricular tachycardia, unspecified: Secondary | ICD-10-CM

## 2022-09-05 DIAGNOSIS — I5022 Chronic systolic (congestive) heart failure: Secondary | ICD-10-CM | POA: Diagnosis not present

## 2022-09-05 DIAGNOSIS — I255 Ischemic cardiomyopathy: Secondary | ICD-10-CM | POA: Diagnosis not present

## 2022-09-05 LAB — BASIC METABOLIC PANEL
Anion gap: 11 (ref 5–15)
BUN: 20 mg/dL (ref 8–23)
CO2: 19 mmol/L — ABNORMAL LOW (ref 22–32)
Calcium: 9.7 mg/dL (ref 8.9–10.3)
Chloride: 110 mmol/L (ref 98–111)
Creatinine, Ser: 1.67 mg/dL — ABNORMAL HIGH (ref 0.61–1.24)
GFR, Estimated: 45 mL/min — ABNORMAL LOW (ref >60)
Glucose, Bld: 93 mg/dL (ref 70–99)
Potassium: 4.2 mmol/L (ref 3.5–5.1)
Sodium: 140 mmol/L (ref 135–145)

## 2022-09-05 MED ORDER — ENTRESTO 97-103 MG PO TABS: 1.0000 | ORAL_TABLET | Freq: Two times a day (BID) | ORAL | 5 refills | Status: DC

## 2022-09-05 NOTE — Progress Notes (Signed)
HEART & VASCULAR TRANSITION OF CARE CONSULT NOTE    [facility]  Referring Physician: Dr [name] [name] Primary Care: Dr [name] [name]  Primary Cardiologist: Dr [name] [name]  EP: Dr [name] [name]  HPI: Referred to clinic by Dr [name] [name] for heart failure consultation.   [name] is a 67 year old with a history of DMII, CAD multivessel, HLD, HTN, PVCs, SVT, VT, obesity, ICM, and chronic HFrEF.  Over the several years he has been followed by Dr [name] [name].   Admitted 08/11/22 with VT. Had  ~ 2.5 minutes sustained VT. Loaded on amiodarone and started on mexiletine. Cath with CAD but no targets for revascularization. Echo showed EF down 30-35% and RV was normal. Had CRT-D placed prior to discharged. GDMT started prior to d/c.   Had f/u with cardiology last week. Spironolactone was increased to 25 mg daily.   Overall feeling fine. Not moving around much and says he is just lazy. Occasionally has balance issues. Denies SOB/PND/Orthopnea. NO chest pain. Appetite ok. No fever or chills. Weight at home trending down 312-->302  pounds. Taking all medications. Sometimes he gets confused about his medications. ooubt Lives alone. Disabled at the age of 51 after MI.    Cardiac Testing  Echo 08/12/22  1. Left ventricular ejection fraction, by estimation, is 30 to 35%. The  left ventricle has moderately decreased function. The left ventricle  demonstrates regional wall motion abnormalities (see scoring  diagram/findings for description). There is mild  left ventricular hypertrophy. Left ventricular diastolic parameters are  consistent with Grade II diastolic dysfunction (pseudonormalization).  Elevated left atrial pressure.   2. Right ventricular systolic function is normal. The right ventricular  size is normal. There is mildly elevated pulmonary artery systolic  pressure. The estimated right ventricular systolic pressure is 0000000 mmHg.   3. Left atrial size was mildly dilated.   4. The mitral valve is normal in structure. Trivial  mitral valve  regurgitation. No evidence of mitral stenosis.   5. The aortic valve is tricuspid. Aortic valve regurgitation is not  visualized. No aortic stenosis is present.   Echo 2023  1. Global hypokinesis with inferobasal akinesis; overall mild LV  dysfunction.   2. Left ventricular ejection fraction, by estimation, is 45 to 50%. The  left ventricle has mildly decreased function. The left ventricle  demonstrates global hypokinesis. The left ventricular internal cavity size  was moderately dilated. Left ventricular  diastolic parameters are consistent with Grade I diastolic dysfunction  (impaired relaxation).   3. Right ventricular systolic function is normal. The right ventricular  size is normal.   4. Left atrial size was mildly dilated.   5. The mitral valve is normal in structure. No evidence of mitral valve  regurgitation. No evidence of mitral stenosis.   6. The aortic valve was not well visualized. Aortic valve regurgitation  is not visualized. No aortic stenosis is present.   Cath 08/2022  Multivessel coronary artery disease, including 99% in-stent restenosis of mid RCA stent with occlusion of distal RCA (may be due to competitive flow from left-to-right collaterals).  There is also 90% stenosis of the apical LAD and mild-moderate disease involving the more proximal LAD and LCx. Mildly elevated left ventricular filling pressure (LVEDP 20 mmHg). Tortuous right subclavian/brachiocephalic artery limiting catheter manipulation.  Consider alternate approach for future catheterizations.  Recommendations: Severe RCA disease is unchanged since 2020 based on cath report from [place].  Question if VT is related to scar from prior inferior MI. Continue medical therapy and  aggressive secondary prevention.  Apical LAD stenosis is too distal for percutaneous intervention. Follow-up EP recommendations regarding management of sustained VT.  Additional Caths 2002 s/p BMS pRCA  2006 - DES to  [place] p/m RCA  2007 -PCI/DES OM4  2008- PCI/CBA to RCA for ISR  2014 -STEMI/Cath/PCI: LAD 95% >> PCI: Promus Prem DES // f. NSTEMI 10/15 >> LHC: mLAD stent ok, dLAD 70, OM1 CTO, OM2 stent ok, dOM2 90, OM3 30-40, dLCx 90, p-mRCA stent ok, mRCA stent ok w/ 60-70 ISR, EF 50% >> med Rx // MV 1/17: no ischemia //  2020- RCA 100 (CTO)  Review of Systems: [y] = yes, [ ]  = no   General: Weight gain [ ] ; Weight loss [ ] ; Anorexia [ ] ; Fatigue [ ] ; Fever [ ] ; Chills [ ] ; Weakness [ ]   Cardiac: Chest pain/pressure [ ] ; Resting SOB [ ] ; Exertional SOB [ Y]; Orthopnea [ ] ; Pedal Edema [ ] ; Palpitations [ ] ; Syncope [ ] ; Presyncope [ ] ; Paroxysmal nocturnal dyspnea[ ]   Pulmonary: Cough [ ] ; Wheezing[ ] ; Hemoptysis[ ] ; Sputum [ ] ; Snoring [ ]   GI: Vomiting[ ] ; Dysphagia[ ] ; Melena[ ] ; Hematochezia [ ] ; Heartburn[ ] ; Abdominal pain [ ] ; Constipation [ ] ; Diarrhea [ ] ; BRBPR [ ]   GU: Hematuria[ ] ; Dysuria [ ] ; Nocturia[ ]   Vascular: Pain in legs with walking [ ] ; Pain in feet with lying flat [ ] ; Non-healing sores [ ] ; Stroke [ ] ; TIA [ ] ; Slurred speech [ ] ;  Neuro: Headaches[ ] ; Vertigo[ ] ; Seizures[ ] ; Paresthesias[ ] ;Blurred vision [ ] ; Diplopia [ ] ; Vision changes [ ]   Ortho/Skin: Arthritis [ ] ; Joint pain [Y ]; Muscle pain [ ] ; Joint swelling [ ] ; Back Pain [Y ]; Rash [ ]   Psych: Depression[ ] ; Anxiety[ ]   Heme: Bleeding problems [ ] ; Clotting disorders [ ] ; Anemia [ ]   Endocrine: Diabetes [Y ]; Thyroid dysfunction[ ]    Past Medical History:  Diagnosis Date   CAD (coronary artery disease)    a. s/p BMS pRCA 2002; b. DES to pRCA & DES p/m RCA 2006; c. PCI/DES OM4 2007; d. PCI/CBA to RCA for ISR 10/2006; e.08/2012 STEMI/Cath/PCI:  LAD 95% >> PCI: Promus Prem DES  //  f. NSTEMI 10/15 >> LHC: mLAD stent ok, dLAD 70, OM1 CTO, OM2 stent ok, dOM2 90, OM3 30-40, dLCx 90, p-mRCA stent ok, mRCA stent ok w/ 60-70 ISR, EF 50% >> med Rx // MV 1/17: no ischemia // Cath ([facility]) 05/2019: RCA 100 (CTO)    Combined  systolic and diastolic CHF    a. Myoview 1/17: EF 35%  //  b. Echo 1/17 EF 45-50% // Echo 05/2019: EF 50-55 // Echocardiogram 8/21: EF 50, inf AK, post and mid inf HK, mod LVH, RVSP 25.3, mild to mod LAE, trivial MR    Depression    Diabetes mellitus ([place])    a. A1c 8.8 08/2012->Metformin initiated. => b. A1c (9/14): 6.6   Gastroesophageal reflux disease    History of nuclear stress test    a. Myoview 1/17: EF 35%, fixed inferior lateral defect consistent with infarct, no ischemia, intermediate risk   History of pneumonia    History of stroke 01/2021   Zio Monitor 11/22: NSR, avg HR 61; PVCs (1.9%); no AFib, no high grade HB, no ventricular arrhythmias   HTN (hypertension)    Hx of NSTEMI    Hyperlipidemia    Ischemic cardiomyopathy    a. EF 40%; improved to normal;  b. 08/2012 Echo: EF 50-55%, mod LVH.//  c. Echo 9/16: Inf HK, mild LVH, EF 55%, mild LAE, normal RVF, mild RAE, PASP 35 mmHg  //  d. Echo 1/17: EF 45-50%, inferior HK, mild BAE, PASP 33 mmHg   Obesity    OSA (obstructive sleep apnea)    Does not use CPAP as of 05/2011   Tobacco abuse     Current Outpatient Medications  Medication Sig Dispense Refill   albuterol (VENTOLIN HFA) 108 (90 Base) MCG/ACT inhaler Inhale 2 puffs into the lungs every 6 (six) hours as needed for wheezing or shortness of breath. 8 g 11   Alcohol Swabs (B-D SINGLE USE SWABS BUTTERFLY) PADS Use up to 4 times a day to test blood sugars. Dx: E10.9, E11.9 100 each 11   amiodarone (PACERONE) 200 MG tablet Take 1 tablet (200 mg total) by mouth daily. 90 tablet 3   aspirin EC 81 MG tablet Take 1 tablet (81 mg total) by mouth daily. Swallow whole. 90 tablet 3   atorvastatin (LIPITOR) 80 MG tablet Take 1 tablet (80 mg total) by mouth every evening. 90 tablet 3   Blood Glucose Calibration (TRUE METRIX LEVEL 1) Low SOLN Use as needed. Dx: E10.9, E11.9 1 each 0   Blood Glucose Monitoring Suppl (ONETOUCH VERIO FLEX SYSTEM) DEVI 1 Device by Does not apply route in the  morning, at noon, in the evening, and at bedtime. E11.9 1 each 0   carvedilol (COREG) 6.25 MG tablet Take 1 tablet (6.25 mg total) by mouth 2 (two) times daily with a meal. 180 tablet 3   clopidogrel (PLAVIX) 75 MG tablet Take 1 tablet (75 mg total) by mouth daily. 90 tablet 3   dorzolamide-timolol (COSOPT) 2-0.5 % ophthalmic solution SMARTSIG:In Eye(s)     empagliflozin (JARDIANCE) 25 MG TABS tablet TAKE 1 TABLET(25 MG) BY MOUTH DAILY BEFORE BREAKFAST 90 tablet 3   ezetimibe (ZETIA) 10 MG tablet Take 1 tablet (10 mg total) by mouth daily. 90 tablet 3   gabapentin (NEURONTIN) 300 MG capsule Take 1 capsule (300 mg total) by mouth 3 (three) times daily as needed. 270 capsule 1   glucose blood (ONETOUCH VERIO) test strip Use as instructed four times per day E11.9 400 each 12   isosorbide-hydrALAZINE (BIDIL) 20-37.5 MG tablet Take 1 tablet by mouth 3 (three) times daily. 90 tablet 3   Lancets MISC Use as directed four times per day E11.9 400 each 3   mexiletine (MEXITIL) 250 MG capsule Take 1 capsule (250 mg total) by mouth every 12 (twelve) hours. 60 capsule 11   nitroGLYCERIN (NITROSTAT) 0.4 MG SL tablet Place 1 tablet (0.4 mg total) under the tongue every 5 (five) minutes as needed for chest pain. N 25 tablet 3   pantoprazole (PROTONIX) 40 MG tablet Take 1 tablet (40 mg total) by mouth daily. 90 tablet 3   PARoxetine (PAXIL) 20 MG tablet TAKE 1 TABLET(20 MG) BY MOUTH DAILY (Patient taking differently: Take 20 mg by mouth daily.) 90 tablet 3   sacubitril-valsartan (ENTRESTO) 97-103 MG Take 1 tablet by mouth 2 (two) times daily. 60 tablet 5   spironolactone (ALDACTONE) 25 MG tablet Take 1 tablet (25 mg total) by mouth daily. 90 tablet 3   tirzepatide (MOUNJARO) 2.5 MG/0.5ML Pen Inject 2.5 mg into the skin once a week. E11.9 2 mL 11   vitamin B-12 (CYANOCOBALAMIN) 1000 MCG tablet Take 1 tablet (1,000 mcg total) by mouth daily. 90 tablet 3   No current facility-administered  medications for this  encounter.    Allergies  Allergen Reactions   Penicillin G Other (See Comments)    Did it involve swelling of the face/tongue/throat, SOB, or low BP?  N/A Did it involve sudden or severe rash/hives, skin peeling, or any reaction on the inside of your mouth or nose? N/A Did you need to seek medical attention at a hospital or doctor's office? N/A When did it last happen? Child If all above answers are "NO", may proceed with cephalosporin use.   Penicillins Other (See Comments)    Did it involve swelling of the face/tongue/throat, SOB, or low BP? N/A Did it involve sudden or severe rash/hives, skin peeling, or any reaction on the inside of your mouth or nose?N/A Did you need to seek medical attention at a hospital or doctor's office? N/A When did it last happen? Child      If all above answers are "NO", may proceed with cephalosporin use.      Social History   Socioeconomic History   Marital status: Single    Spouse name: Not on file   Number of children: 0   Years of education: Not on file   Highest education level: 10th grade  Occupational History   Occupation: retired  Tobacco Use   Smoking status: Former    Packs/day: 0.50    Years: 30.00    Additional pack years: 0.00    Total pack years: 15.00    Types: Cigarettes   Smokeless tobacco: Never   Tobacco comments:    trying to cut down  Vaping Use   Vaping Use: Never used  Substance and Sexual Activity   Alcohol use: No   Drug use: No    Comment: remote marijuana use   Sexual activity: Not Currently  Other Topics Concern   Not on file  Social History Narrative   Lives alone.  He has no children.   He retired early due to health problems.         Social Determinants of Health   Financial Resource Strain: Low Risk  (09/05/2022)   Overall Financial Resource Strain (CARDIA)    Difficulty of Paying Living Expenses: Not very hard  Food Insecurity: No Food Insecurity (09/05/2022)   Hunger Vital Sign    Worried  About Running Out of Food in the Last Year: Never true    Ran Out of Food in the Last Year: Never true  Transportation Needs: No Transportation Needs (08/21/2022)   PRAPARE - Hydrologist (Medical): No    Lack of Transportation (Non-Medical): No  Physical Activity: Inactive (09/20/2021)   Exercise Vital Sign    Days of Exercise per Week: 0 days    Minutes of Exercise per Session: 0 min  Stress: No Stress Concern Present (09/20/2021)   [place]    Feeling of Stress : Not at all  Social Connections: Socially Isolated (09/20/2021)   Social Connection and Isolation Panel [NHANES]    Frequency of Communication with Friends and Family: More than three times a week    Frequency of Social Gatherings with Friends and Family: More than three times a week    Attends Religious Services: Never    Marine scientist or Organizations: No    Attends Archivist Meetings: Never    Marital Status: Never married  Intimate Partner Violence: Not At Risk (08/15/2022)   Humiliation, Afraid, Rape, and Kick questionnaire  Fear of Current or Ex-Partner: No    Emotionally Abused: No    Physically Abused: No    Sexually Abused: No      Family History  Problem Relation Age of Onset   Depression Mother    Cancer Mother        ovarian   Hypertension Mother        Died, 35   Other Father        motor vehicle accident   Coronary artery disease Other    Heart attack Neg Hx    Stroke Neg Hx     Vitals:   09/05/22 1510  BP: (!) 158/90  [name]: 65  SpO2: 94%  Weight: (!) 142.1 kg (313 lb 3.2 oz)   Wt Readings from Last 3 Encounters:  09/05/22 (!) 142.1 kg (313 lb 3.2 oz)  09/03/22 (!) 142.4 kg (314 lb)  08/27/22 (!) 142 kg (313 lb)    PHYSICAL EXAM: General:  Well appearing. No respiratory difficulty HEENT: normal Neck: supple. no JVD. Carotids 2+ bilat; no bruits. No lymphadenopathy or  thryomegaly appreciated. Cor: PMI nondisplaced. Regular rate & rhythm. No rubs, gallops or murmurs. Lungs: clear Abdomen: soft, nontender, nondistended. No hepatosplenomegaly. No bruits or masses. Good bowel sounds. Extremities: no cyanosis, clubbing, rash, edema Neuro: alert & oriented x 3, cranial nerves grossly intact. moves all 4 extremities w/o difficulty. Affect pleasant.  ASSESSMENT & PLAN: 1. HFrEF ,ICM  Recent Echo showed EF down from previous 45-50% --> 30-35% suspect in the setting VT.  S/P St Jude BiV . Repeat Echo in 3 months after HF meds optimized.  NYHA II. Volume status look ok.  GDMT  BB- Continue coreg 6.25 mg twice a day. Heart rate 64 bpm.  Ace/ARB/ARNI- Increase entresto 97-103 twice a day  MRA- Continue spironolactone 25 mg daily  SGLT2i- Continue jardiance 10 mg daily  - Continue Bidil 1 tab three times a day.  -Check BMET   2. CAD , multivessel disease - Most recent cath  -99% in-stent restenosis of mid RCA stent with occlusion of distal RCA (may be due to competitive flow from left-to-right collaterals). There is also 90% stenosis of the apical LAD and mild-moderate disease involving the more proximal LAD and LCx.  - No chest pain.  -On ASA+ Plavix + high intensity statin.  -Repeat lipids in 8 weeks. May need addition of PSK9i  - Needs cardiac rehab.   3. VT -08/2022 Recently admitted with sustained VT.  -S/P St Jude BiV -On amio + mexiletine.  -Has f/u with EP.   4. Obesity  Body mass index is 41.32 kg/m. Discussed portion control.  On mounjaro per PCP.   5. HTN Elevated. Increase entresto as noted above.   6. CKD Stage IIIa Creatinine baseline ~1.5  Check BMET   Referred to HFSW (PCP, Medications, Transportation, ETOH Abuse, Drug Abuse, Insurance, Financial ): Yes  HF Paramedicine.  Refer to Pharmacy:  No Refer to Home Health: No Refer to Advanced Heart Failure Clinic: Dr Daniel Nones  Refer to General Cardiology: Shared with Dr [name] [name]  Has  questions about his medications I am going to refer to HF Paramedicine  for medication management.  Suspect it will be short term.   Follow up in 3 week with Dr Gladstone Lighter NP-C  4:39 PM

## 2022-09-05 NOTE — Patient Instructions (Signed)
Labs done today. We will contact you only if your labs are abnormal.  INCREASE Entresto to 97-103mg  (1 tablet) by mouth 2 times daily.   No other medication changes were made. Please continue all current medications as prescribed.  Your physician recommends that you schedule a follow-up appointment in: 3 weeks with Dr. Daniel Nones here in our office.  If you have any questions or concerns before your next appointment please send [place] a message through [place] or call our office at [phone].    TO LEAVE A MESSAGE FOR THE NURSE SELECT OPTION 2, PLEASE LEAVE A MESSAGE INCLUDING: YOUR NAME DATE OF BIRTH CALL BACK NUMBER REASON FOR CALL**this is important as we prioritize the call backs  YOU WILL RECEIVE A CALL BACK THE SAME DAY AS LONG AS YOU CALL BEFORE 4:00 PM   Do the following things EVERYDAY: Weigh yourself in the morning before breakfast. Write it down and keep it in a log. Take your medicines as prescribed Eat low salt foods--Limit salt (sodium) to 2000 mg per day.  Stay as active as you can everyday Limit all fluids for the day to less than 2 liters   At the [facility], you and your health needs are our priority. As part of our continuing mission to provide you with exceptional heart care, we have created designated Provider Care Teams. These Care Teams include your primary Cardiologist (physician) and Advanced Practice Providers (APPs- Physician Assistants and Nurse Practitioners) who all work together to provide you with the care you need, when you need it.   You may see any of the following providers on your designated Care Team at your next follow up: Dr Glori Bickers Dr Haynes Kerns, NP Lyda Jester, [place] Audry Riles, PharmD   Please be sure to bring in all your medications bottles to every appointment.

## 2022-09-05 NOTE — Progress Notes (Signed)
Heart and Vascular Care Navigation  09/05/2022  [name] [name] December 18, 1955 [id number]  Reason for Referral: paramedicine enrollment   Engaged with patient face to face for initial visit for Heart and Vascular Care Coordination.                                                                                                   Assessment:             Paramedicine Initial Assessment:  Housing:  In what kind of housing do you live? House/apt/trailer/shelter?  Do you rent/pay a mortgage/own?  Do you live with anyone?  Are you currently worried about losing your housing?  Within the past 12 months have you ever stayed outside, in a car, tent, a shelter, or temporarily with someone?  Within the past 12 months have you been unable to get utilities when it was really needed?  Social:  What is your current marital status? single  Do you have any children? no  Do you have family or friends who live locally? Half sibling but doesn't talk to her   Income:  What is your current source of income? $1,200 a month  Insurance:  Are you currently insured?   Do you have prescription coverage?  If no insurance, have you applied for coverage (Medicaid, disability, marketplace etc)?  Transportation:  Do you have transportation to your medical appointments?  In the past 12 months has lack of transportation kept you from medical appts or from getting medications?  In the past 12 months has lack of transportation kept you from meetings, work, or getting things you needed?   Daily Health Needs: Do you have a working scale at home? yes  How do you manage your medications at home? Takes them out of a bottle- on a lot of new medications.  Sometimes is confusing but got call from [facility] pharmacist who helped to clarify things  Do you have issues affording your medications?  If yes, has this ever prevented you from obtaining medications?  Do you have any concerns with mobility at home?  Do  you use any assistive devices at home or have PCS at home? Sometimes a cane when needed.  Do you have a PCP? Dr. Cathlean Cower  Do you have any trouble reading or writing? Is illiterate but understands medication bottles he reports and is good about asking for help.  Do you currently use tobacco products or have recently quit? no  Do you currently see any mental health providers? No but interested in doing so.  Reports he struggled with terrible depression last year and couldn't get out of bed.  States that he has struggled with multiple instance of depression over the past several years but feels well at this time.  CSW discussed counseling as an option to assist with avoiding or lessening future episodes- pt agreeable to referral to [facility]- referral placed.  Are there any additional barriers you see to getting the care you need? Not at this time  HRT/VAS Care Coordination     Patients Home Cardiology Office Heart Failure Clinic   Outpatient Care Team [facility]; Social Worker   Living arrangements for the past 2 months Single Family Home   Lives with: Self   Patient Current Insurance Coverage Managed Medicare; Medicaid   Patient Has Concern With Paying Medical Bills No   Does Patient Have Prescription Coverage? Yes   Home Assistive Devices/Equipment None   DME Agency NA   HH Agency NA       Social History:                                                                             SDOH Screenings   Food Insecurity: No Food Insecurity (09/05/2022)  Housing: Low Risk  (09/05/2022)  Transportation Needs: No Transportation Needs (08/21/2022)  Utilities: Not At Risk (09/05/2022)  Alcohol Screen: Low Risk  (08/15/2022)  Depression (PHQ2-9): Low Risk  (08/21/2022)  Financial Resource Strain: Low Risk  (09/05/2022)  Physical Activity: Inactive (09/20/2021)  Social Connections: Socially Isolated (09/20/2021)  Stress: No Stress Concern  Present (09/20/2021)  Tobacco Use: Medium Risk (09/05/2022)    SDOH Interventions: Financial Resources:    Retirement income- $1,200/month  Food Insecurity:  Food Insecurity Interventions: Intervention Not Indicated  Housing Insecurity:  Housing Interventions: Intervention Not Indicated  Transportation:    Not assessed   Follow-up plan:    CSW will send referral to paramedics for assignment and follow up  Jorge Ny, [facility] Desk#: [phone] Cell#: [phone]

## 2022-09-06 NOTE — Addendum Note (Signed)
Encounter addended by: Jorge Ny, LCSW on: 09/06/2022 8:14 AM  Actions taken: Clinical Note Signed

## 2022-09-06 NOTE — Progress Notes (Signed)
  Care Coordination Note  09/06/2022 Name: Yance Seaborne MRN: [id number] DOB: May 06, 1956  Marcas Babcock is a 67 y.o. year old male who is a primary care patient of Biagio Borg, MD and is actively engaged with the care management team. I reached out to [facility] by phone today to assist with re-scheduling a follow up visit with the RN Case Manager  Follow up plan: We have been unable to make contact with the patient for follow up.   Julian Hy, [place] Direct Dial: [phone]

## 2022-09-10 ENCOUNTER — Telehealth: Payer: Self-pay | Admitting: Cardiology

## 2022-09-10 NOTE — Telephone Encounter (Addendum)
Mr Wyzykowski is a 67 year old with a history of DMII, CAD multivessel, HLD, HTN, PVCs, SVT, VT, obesity, ICM, and chronic HFrEF who called into the triage line for shortness of breath. Follows with Dr. [name] as his general cardiologist and Dr. Myles Gip as his EP. Last seen by Darrick Grinder in the HF clinic on 09/05/22. Tried calling patient x2 at [phone] without answer. Eventually able to get in touch with him to discuss his concerns.  Thinks he "may be carrying some fluid" with SOB, dry cough. Endorsing some orthopnea as well. Lost track of his water intake so wonders if he may have drank more than he needed to. Feels better now after going out to sit on the porch and breathing the cold air. Weighing himself, he states that he weighs 302 lbs (states that that's been less than his weight before). Now that he is not having shortness of breath, Mr. Bolivar would prefer not to come into the ED. We discussed that if he was still having symptoms tonight that he should come in to the ED for additional evaluation, but if his symptoms resolve and he's able to get some sleep tonight without symptoms, then it would be ok for outpatient follow-up. He is not on any standing diuretics so I told him that I would let his cardiology team know (Dr. [name] and Darrick Grinder) in case they thought he would need some standing lasix to help with additional diuresis vs an alternative workup during the day time. Will route message to his cardiology team for continued follow-up. He verbalized understanding and notes that he will come to the ED if his symptoms recur/worsen.  Pollie Meyer, MD Cardiology 09/10/22

## 2022-09-11 NOTE — Telephone Encounter (Signed)
Pt has appt in HF Clinic in 3 weeks. In meantime, if he gains 3# above baseline weight, he should start furosemide 40 mg BID until back to baseline. thx

## 2022-09-13 ENCOUNTER — Telehealth (HOSPITAL_COMMUNITY): Payer: Self-pay | Admitting: Emergency Medicine

## 2022-09-13 MED ORDER — FUROSEMIDE 40 MG PO TABS: ORAL_TABLET | ORAL | 0 refills | Status: DC

## 2022-09-13 NOTE — Addendum Note (Signed)
Addended by: Ma Hillock on: 09/13/2022 11:52 AM   Modules accepted: Orders

## 2022-09-13 NOTE — Telephone Encounter (Signed)
Called and spoke with patient who verbalized understanding of the directions/need for taking Furosemide. Confirmed his appt with HF clinic. Medication called into pharmacy after three unsuccessful attempts to e-prescribe.

## 2022-09-13 NOTE — Telephone Encounter (Signed)
Spoke with [name] and discussed paramedicine program.  Scheduled initial home visit 09/18/22 @ 11:30.    Renee Ramus, [place] 09/13/2022

## 2022-09-14 ENCOUNTER — Other Ambulatory Visit: Payer: Self-pay

## 2022-09-14 MED ORDER — MEXILETINE HCL 250 MG PO CAPS: 250.0000 mg | ORAL_CAPSULE | Freq: Two times a day (BID) | ORAL | 3 refills | Status: DC

## 2022-09-14 NOTE — Telephone Encounter (Signed)
Pt's medication was sent to pt's pharmacy as requested. Confirmation received.  °

## 2022-09-15 ENCOUNTER — Emergency Department (HOSPITAL_COMMUNITY): Payer: 59

## 2022-09-15 ENCOUNTER — Encounter (HOSPITAL_COMMUNITY): Payer: Self-pay

## 2022-09-15 ENCOUNTER — Encounter (HOSPITAL_BASED_OUTPATIENT_CLINIC_OR_DEPARTMENT_OTHER): Payer: Self-pay | Admitting: Cardiovascular Disease

## 2022-09-15 ENCOUNTER — Inpatient Hospital Stay (HOSPITAL_COMMUNITY)
Admission: EM | Admit: 2022-09-15 | Discharge: 2022-09-18 | DRG: 690 | Disposition: A | Discharge status: Home / Self Care | Payer: 59 | Attending: Internal Medicine | Admitting: Internal Medicine

## 2022-09-15 ENCOUNTER — Other Ambulatory Visit: Payer: Self-pay

## 2022-09-15 DIAGNOSIS — F32A Depression, unspecified: Secondary | ICD-10-CM | POA: Diagnosis present

## 2022-09-15 DIAGNOSIS — R531 Weakness: Secondary | ICD-10-CM

## 2022-09-15 DIAGNOSIS — N179 Acute kidney failure, unspecified: Secondary | ICD-10-CM | POA: Diagnosis not present

## 2022-09-15 DIAGNOSIS — N1832 Chronic kidney disease, stage 3b: Secondary | ICD-10-CM | POA: Diagnosis present

## 2022-09-15 DIAGNOSIS — E8729 Other acidosis: Secondary | ICD-10-CM | POA: Diagnosis not present

## 2022-09-15 DIAGNOSIS — Z87891 Personal history of nicotine dependence: Secondary | ICD-10-CM

## 2022-09-15 DIAGNOSIS — I4729 Other ventricular tachycardia: Secondary | ICD-10-CM | POA: Diagnosis present

## 2022-09-15 DIAGNOSIS — Z95810 Presence of automatic (implantable) cardiac defibrillator: Secondary | ICD-10-CM

## 2022-09-15 DIAGNOSIS — Z8673 Personal history of transient ischemic attack (TIA), and cerebral infarction without residual deficits: Secondary | ICD-10-CM | POA: Diagnosis not present

## 2022-09-15 DIAGNOSIS — E861 Hypovolemia: Secondary | ICD-10-CM | POA: Diagnosis not present

## 2022-09-15 DIAGNOSIS — N390 Urinary tract infection, site not specified: Secondary | ICD-10-CM | POA: Diagnosis not present

## 2022-09-15 DIAGNOSIS — E1122 Type 2 diabetes mellitus with diabetic chronic kidney disease: Secondary | ICD-10-CM | POA: Diagnosis not present

## 2022-09-15 DIAGNOSIS — I130 Hypertensive heart and chronic kidney disease with heart failure and stage 1 through stage 4 chronic kidney disease, or unspecified chronic kidney disease: Secondary | ICD-10-CM | POA: Diagnosis present

## 2022-09-15 DIAGNOSIS — Z7985 Long-term (current) use of injectable non-insulin antidiabetic drugs: Secondary | ICD-10-CM

## 2022-09-15 DIAGNOSIS — Z1152 Encounter for screening for COVID-19: Secondary | ICD-10-CM

## 2022-09-15 DIAGNOSIS — I5042 Chronic combined systolic (congestive) and diastolic (congestive) heart failure: Secondary | ICD-10-CM | POA: Diagnosis present

## 2022-09-15 DIAGNOSIS — I517 Cardiomegaly: Secondary | ICD-10-CM | POA: Diagnosis not present

## 2022-09-15 DIAGNOSIS — I252 Old myocardial infarction: Secondary | ICD-10-CM

## 2022-09-15 DIAGNOSIS — B9620 Unspecified Escherichia coli [E. coli] as the cause of diseases classified elsewhere: Secondary | ICD-10-CM | POA: Diagnosis present

## 2022-09-15 DIAGNOSIS — E785 Hyperlipidemia, unspecified: Secondary | ICD-10-CM | POA: Diagnosis not present

## 2022-09-15 DIAGNOSIS — I10 Essential (primary) hypertension: Secondary | ICD-10-CM | POA: Diagnosis not present

## 2022-09-15 DIAGNOSIS — Z91199 Patient's noncompliance with other medical treatment and regimen due to unspecified reason: Secondary | ICD-10-CM

## 2022-09-15 DIAGNOSIS — Z7902 Long term (current) use of antithrombotics/antiplatelets: Secondary | ICD-10-CM

## 2022-09-15 DIAGNOSIS — E782 Mixed hyperlipidemia: Secondary | ICD-10-CM

## 2022-09-15 DIAGNOSIS — I480 Paroxysmal atrial fibrillation: Secondary | ICD-10-CM | POA: Diagnosis not present

## 2022-09-15 DIAGNOSIS — I255 Ischemic cardiomyopathy: Secondary | ICD-10-CM | POA: Diagnosis not present

## 2022-09-15 DIAGNOSIS — N189 Chronic kidney disease, unspecified: Secondary | ICD-10-CM | POA: Diagnosis not present

## 2022-09-15 DIAGNOSIS — N3000 Acute cystitis without hematuria: Secondary | ICD-10-CM | POA: Diagnosis not present

## 2022-09-15 DIAGNOSIS — I2510 Atherosclerotic heart disease of native coronary artery without angina pectoris: Secondary | ICD-10-CM | POA: Diagnosis present

## 2022-09-15 DIAGNOSIS — Z79899 Other long term (current) drug therapy: Secondary | ICD-10-CM

## 2022-09-15 DIAGNOSIS — N3090 Cystitis, unspecified without hematuria: Secondary | ICD-10-CM

## 2022-09-15 DIAGNOSIS — I472 Ventricular tachycardia, unspecified: Secondary | ICD-10-CM | POA: Diagnosis not present

## 2022-09-15 DIAGNOSIS — K219 Gastro-esophageal reflux disease without esophagitis: Secondary | ICD-10-CM | POA: Diagnosis not present

## 2022-09-15 DIAGNOSIS — Z8249 Family history of ischemic heart disease and other diseases of the circulatory system: Secondary | ICD-10-CM

## 2022-09-15 DIAGNOSIS — Z818 Family history of other mental and behavioral disorders: Secondary | ICD-10-CM

## 2022-09-15 DIAGNOSIS — R609 Edema, unspecified: Secondary | ICD-10-CM | POA: Diagnosis not present

## 2022-09-15 DIAGNOSIS — Z7984 Long term (current) use of oral hypoglycemic drugs: Secondary | ICD-10-CM

## 2022-09-15 DIAGNOSIS — G4733 Obstructive sleep apnea (adult) (pediatric): Secondary | ICD-10-CM | POA: Diagnosis present

## 2022-09-15 DIAGNOSIS — Z880 Allergy status to penicillin: Secondary | ICD-10-CM | POA: Diagnosis not present

## 2022-09-15 DIAGNOSIS — Z955 Presence of coronary angioplasty implant and graft: Secondary | ICD-10-CM | POA: Diagnosis not present

## 2022-09-15 DIAGNOSIS — Z7982 Long term (current) use of aspirin: Secondary | ICD-10-CM

## 2022-09-15 LAB — COMPREHENSIVE METABOLIC PANEL
ALT: 20 U/L (ref 0–44)
AST: 19 U/L (ref 15–41)
Albumin: 4.1 g/dL (ref 3.5–5.0)
Alkaline Phosphatase: 82 U/L (ref 38–126)
Anion gap: 16 — ABNORMAL HIGH (ref 5–15)
BUN: 13 mg/dL (ref 8–23)
CO2: 15 mmol/L — ABNORMAL LOW (ref 22–32)
Calcium: 10.1 mg/dL (ref 8.9–10.3)
Chloride: 107 mmol/L (ref 98–111)
Creatinine, Ser: 1.67 mg/dL — ABNORMAL HIGH (ref 0.61–1.24)
GFR, Estimated: 45 mL/min — ABNORMAL LOW (ref >60)
Glucose, Bld: 91 mg/dL (ref 70–99)
Potassium: 3.9 mmol/L (ref 3.5–5.1)
Sodium: 138 mmol/L (ref 135–145)
Total Bilirubin: 1.1 mg/dL (ref 0.3–1.2)
Total Protein: 7.8 g/dL (ref 6.5–8.1)

## 2022-09-15 LAB — CBC WITH DIFFERENTIAL/PLATELET
Abs Immature Granulocytes: 0.02 10*3/uL (ref 0.00–0.07)
Basophils Absolute: 0 10*3/uL (ref 0.0–0.1)
Basophils Relative: 0 %
Eosinophils Absolute: 0 10*3/uL (ref 0.0–0.5)
Eosinophils Relative: 0 %
HCT: 53.5 % — ABNORMAL HIGH (ref 39.0–52.0)
Hemoglobin: 17.8 g/dL — ABNORMAL HIGH (ref 13.0–17.0)
Immature Granulocytes: 0 %
Lymphocytes Relative: 17 %
Lymphs Abs: 1.6 10*3/uL (ref 0.7–4.0)
MCH: 29 pg (ref 26.0–34.0)
MCHC: 33.3 g/dL (ref 30.0–36.0)
MCV: 87.1 fL (ref 80.0–100.0)
Monocytes Absolute: 0.8 10*3/uL (ref 0.1–1.0)
Monocytes Relative: 9 %
Neutro Abs: 6.7 10*3/uL (ref 1.7–7.7)
Neutrophils Relative %: 74 %
Platelets: 185 10*3/uL (ref 150–400)
RBC: 6.14 MIL/uL — ABNORMAL HIGH (ref 4.22–5.81)
RDW: 16.1 % — ABNORMAL HIGH (ref 11.5–15.5)
WBC: 9.1 10*3/uL (ref 4.0–10.5)
nRBC: 0 % (ref 0.0–0.2)

## 2022-09-15 LAB — LACTIC ACID, PLASMA
Lactic Acid, Venous: 1.2 mmol/L (ref 0.5–1.9)
Lactic Acid, Venous: 1.4 mmol/L (ref 0.5–1.9)

## 2022-09-15 LAB — GLUCOSE, CAPILLARY: Glucose-Capillary: 91 mg/dL (ref 70–99)

## 2022-09-15 LAB — URINALYSIS, W/ REFLEX TO CULTURE (INFECTION SUSPECTED)
Bilirubin Urine: NEGATIVE
Glucose, UA: >=500 mg/dL — AB
Ketones, ur: 20 mg/dL — AB
Nitrite: POSITIVE — AB
Protein, ur: >=300 mg/dL — AB
Specific Gravity, Urine: 1.022 (ref 1.005–1.030)
WBC, UA: >50 WBC/hpf (ref 0–5)
pH: 5 (ref 5.0–8.0)

## 2022-09-15 LAB — PROTIME-INR
INR: 1 (ref 0.8–1.2)
Prothrombin Time: 13.3 seconds (ref 11.4–15.2)

## 2022-09-15 LAB — APTT: aPTT: 30 seconds (ref 24–36)

## 2022-09-15 LAB — CK: Total CK: 111 U/L (ref 49–397)

## 2022-09-15 LAB — MAGNESIUM: Magnesium: 2 mg/dL (ref 1.7–2.4)

## 2022-09-15 LAB — TROPONIN I (HIGH SENSITIVITY)
Troponin I (High Sensitivity): 51 ng/L — ABNORMAL HIGH (ref <18)
Troponin I (High Sensitivity): 61 ng/L — ABNORMAL HIGH (ref <18)

## 2022-09-15 LAB — SALICYLATE LEVEL: Salicylate Lvl: <7 mg/dL — ABNORMAL LOW (ref 7.0–30.0)

## 2022-09-15 LAB — BRAIN NATRIURETIC PEPTIDE: B Natriuretic Peptide: 62.3 pg/mL (ref 0.0–100.0)

## 2022-09-15 LAB — SARS CORONAVIRUS 2 BY RT PCR: SARS Coronavirus 2 by RT PCR: NEGATIVE

## 2022-09-15 MED ORDER — SACUBITRIL-VALSARTAN 97-103 MG PO TABS
1.0000 | ORAL_TABLET | Freq: Two times a day (BID) | ORAL | Status: DC
  Administered 2022-09-15 – 2022-09-18 (×6): 1 via ORAL
  Filled 2022-09-15 (×6): qty 1

## 2022-09-15 MED ORDER — ACETAMINOPHEN 650 MG RE SUPP: 650.0000 mg | Freq: Four times a day (QID) | RECTAL | Status: DC | PRN

## 2022-09-15 MED ORDER — SODIUM CHLORIDE 0.9 % IV SOLN
1.0000 g | INTRAVENOUS | Status: DC
  Administered 2022-09-16: 1 g via INTRAVENOUS
  Filled 2022-09-15: qty 10

## 2022-09-15 MED ORDER — INSULIN ASPART 100 UNIT/ML IJ SOLN
0.0000 [IU] | Freq: Three times a day (TID) | INTRAMUSCULAR | Status: DC
  Administered 2022-09-16 – 2022-09-17 (×3): 1 [IU] via SUBCUTANEOUS

## 2022-09-15 MED ORDER — EZETIMIBE 10 MG PO TABS
10.0000 mg | ORAL_TABLET | Freq: Every day | ORAL | Status: DC
  Administered 2022-09-16 – 2022-09-18 (×3): 10 mg via ORAL
  Filled 2022-09-15 (×3): qty 1

## 2022-09-15 MED ORDER — CLOPIDOGREL BISULFATE 75 MG PO TABS
75.0000 mg | ORAL_TABLET | Freq: Every day | ORAL | Status: DC
  Administered 2022-09-16 – 2022-09-18 (×3): 75 mg via ORAL
  Filled 2022-09-15 (×3): qty 1

## 2022-09-15 MED ORDER — MEXILETINE HCL 250 MG PO CAPS
250.0000 mg | ORAL_CAPSULE | Freq: Two times a day (BID) | ORAL | Status: DC
  Administered 2022-09-15 – 2022-09-18 (×6): 250 mg via ORAL
  Filled 2022-09-15 (×7): qty 1

## 2022-09-15 MED ORDER — MELATONIN 3 MG PO TABS
3.0000 mg | ORAL_TABLET | Freq: Every evening | ORAL | Status: DC | PRN
  Administered 2022-09-15 – 2022-09-16 (×2): 3 mg via ORAL
  Filled 2022-09-15 (×2): qty 1

## 2022-09-15 MED ORDER — CARVEDILOL 3.125 MG PO TABS
6.2500 mg | ORAL_TABLET | Freq: Two times a day (BID) | ORAL | Status: DC
  Administered 2022-09-15 – 2022-09-18 (×6): 6.25 mg via ORAL
  Filled 2022-09-15 (×6): qty 2

## 2022-09-15 MED ORDER — AMIODARONE HCL 200 MG PO TABS
200.0000 mg | ORAL_TABLET | Freq: Every day | ORAL | Status: DC
  Administered 2022-09-16 – 2022-09-18 (×3): 200 mg via ORAL
  Filled 2022-09-15 (×3): qty 1

## 2022-09-15 MED ORDER — SODIUM CHLORIDE 0.9 % IV SOLN
1.0000 g | Freq: Once | INTRAVENOUS | Status: AC
  Administered 2022-09-15: 1 g via INTRAVENOUS
  Filled 2022-09-15: qty 10

## 2022-09-15 MED ORDER — ONDANSETRON HCL 4 MG/2ML IJ SOLN: 4.0000 mg | Freq: Four times a day (QID) | INTRAMUSCULAR | Status: DC | PRN

## 2022-09-15 MED ORDER — ISOSORB DINITRATE-HYDRALAZINE 20-37.5 MG PO TABS
1.0000 | ORAL_TABLET | Freq: Three times a day (TID) | ORAL | Status: DC
  Administered 2022-09-15 – 2022-09-18 (×8): 1 via ORAL
  Filled 2022-09-15 (×8): qty 1

## 2022-09-15 MED ORDER — ACETAMINOPHEN 325 MG PO TABS
650.0000 mg | ORAL_TABLET | Freq: Four times a day (QID) | ORAL | Status: DC | PRN
  Administered 2022-09-15 – 2022-09-17 (×2): 650 mg via ORAL
  Filled 2022-09-15 (×2): qty 2

## 2022-09-15 MED ORDER — ATORVASTATIN CALCIUM 40 MG PO TABS
80.0000 mg | ORAL_TABLET | Freq: Every evening | ORAL | Status: DC
  Administered 2022-09-15 – 2022-09-17 (×3): 80 mg via ORAL
  Filled 2022-09-15 (×3): qty 2

## 2022-09-15 MED ORDER — SPIRONOLACTONE 12.5 MG HALF TABLET
25.0000 mg | ORAL_TABLET | Freq: Every day | ORAL | Status: DC
  Administered 2022-09-16 – 2022-09-17 (×2): 25 mg via ORAL
  Filled 2022-09-15 (×2): qty 2

## 2022-09-15 MED ORDER — ASPIRIN 81 MG PO TBEC
81.0000 mg | DELAYED_RELEASE_TABLET | Freq: Every day | ORAL | Status: DC
  Administered 2022-09-16 – 2022-09-18 (×3): 81 mg via ORAL
  Filled 2022-09-15 (×3): qty 1

## 2022-09-15 NOTE — Procedures (Signed)
Patient Name: [name] [name], [name] [name] Date: 09/03/2022 Gender: Male D.O.B: 1955-08-14 Age (years): 30 Referring Provider: Nicki Guadalajara MD, ABSM Height (inches): 73 Interpreting Physician: Nicki Guadalajara MD, ABSM Weight (lbs): 314 RPSGT: Rosette Reveal BMI: 41 MRN: [id number] Neck Size: 17.00  CLINICAL INFORMATION Sleep Study Type: NPSG  Indication for sleep study: Diabetes, Hypertension, OSA, Ischemic cardiomyopathy  Epworth Sleepiness Score: 9  Most recent polysomnogram dated 08/16/2015 revealed an AHI of 11.7/h and RDI of 18.3/h. Most recent titration study dated 08/16/2015 was optimal at 11cm H2O with an AHI of 3.2/h.  SLEEP STUDY TECHNIQUE As per the AASM Manual for the Scoring of Sleep and Associated Events v2.3 (April 2016) with a hypopnea requiring 4% desaturations.  The channels recorded and monitored were frontal, central and occipital EEG, electrooculogram (EOG), submentalis EMG (chin), nasal and oral airflow, thoracic and abdominal wall motion, anterior tibialis EMG, snore microphone, electrocardiogram, and pulse oximetry.  MEDICATIONS Medications self-administered by patient taken the night of the study : CARVEDILOL, CRESTOR, FAMOTIDINE, FUROSEMIDE, GABAPENTIN, LEXAPRO, TRAMADOL  SLEEP ARCHITECTURE The study was initiated at 11:05:21 PM and ended at 5:11:57 AM.  Sleep onset time was 38.3 minutes and the sleep efficiency was 56.7%. The total sleep time was 207.8 minutes.  Stage REM latency was 60.5 minutes.  The patient spent 16.4% of the night in stage N1 sleep, 55.2% in stage N2 sleep, 0.0% in stage N3 and 28.4% in REM.  Alpha intrusion was absent.  Supine sleep was 0.00%.  RESPIRATORY PARAMETERS The overall apnea/hypopnea index (AHI) was 18.2 per hour.  The respiratory disturbance index (RDI) was 34.1/h. There were 3 total apneas, including 1 obstructive, 2 central and 0 mixed apneas. There were 60 hypopneas and 55 RERAs.  The AHI during Stage REM  sleep was 12.2 per hour.  AHI while supine was N/A per hour; supine sleep was absent.  The mean oxygen saturation was 90.0%. The minimum SpO2 during sleep was 86.0%.  Loud snoring was noted during this study.  CARDIAC DATA The 2 lead EKG demonstrated sinus rhythm, pacemaker generated. The mean heart rate was 61.2 beats per minute. Other EKG findings include: None.  LEG MOVEMENT DATA The total PLMS were 0 with a resulting PLMS index of 0.0. Associated arousal with leg movement index was 0.0 .  IMPRESSIONS - Moderate obstructive sleep apnea occurred during this study (AHI 18.2/h; RDI 34.1/h). Supine sleep was absent which may have affected overall severity. - No significant central sleep apnea occurred during this study (CA 0.6/h). - Mild oxygen desaturation to a nadir of 86.0%. - The patient snored with loud snoring volume. - Reduced sleep efficiency at 56.7%. - No significant cardiac abnormalities were noted during this study; rare PVC's - Clinically significant periodic limb movements did not occur during sleep. No significant associated arousals.  DIAGNOSIS - Obstructive Sleep Apnea (G47.33) - Nocturnal Hypoxemia (G47.36) - Loud snoring  RECOMMENDATIONS - Therapeutic CPAP titration to determine optimal pressure required to alleviate sleep disordered breathing. In this patient with significant cardiovascular co-morbidities recommend an in-lab CPAP/BiPAP titration. If unable, then initiate Auto PAP with EPR of 3 at 7 - 20 cm of water. - Effort should be made to optimize nasal and oropharyngheal patency. - Avoid alcohol, sedatives and other CNS depressants that may worsen sleep apnea and disrupt normal sleep architecture. - Sleep hygiene should be reviewed to assess factors that may improve sleep quality. - Weight management (BMI 41) and regular exercise should be initiated or  continued if appropriate.  [Electronically signed] 09/15/2022 09:29 AM  Nicki Guadalajara MD, [facility],  ABSM Diplomate, American Board of Sleep Medicine  NPI: [id number]  [facility] PH: [phone]   FX: [phone] ACCREDITED BY THE AMERICAN ACADEMY OF SLEEP MEDICINE

## 2022-09-15 NOTE — ED Provider Notes (Signed)
[place] EMERGENCY DEPARTMENT AT [facility] Provider Note   CSN: [id number] Arrival date & time: 09/15/22  [id number]     History {Add pertinent medical, surgical, social history, OB history to HPI:1} Chief Complaint  Patient presents with   Shortness of Breath    [name] CrawfordSylvester [name] is a 67 y.o. male.  He has a history of coronary disease, VT, cardiomyopathy and CHF.  He just had a biventricular pacer placed few weeks ago.  He said today he is felt very tired and slept all day.  Had some shaking chills. feels a little more short of breath lying flat maybe is abdomen is a little more distended.  Nonproductive cough.  No urinary symptoms.  Did have a little bit of diarrhea but he thinks that is related to his Mounjaro.  No abdominal pain.  The history is provided by the patient.  Shortness of Breath Severity:  Moderate Onset quality:  Gradual Duration:  1 day Timing:  Intermittent Progression:  Partially resolved Chronicity:  Recurrent Relieved by:  None tried Worsened by:  Nothing Ineffective treatments:  None tried Associated symptoms: cough and fever   Associated symptoms: no abdominal pain, no chest pain, no hemoptysis, no sputum production and no vomiting   Risk factors: recent surgery   Risk factors: no tobacco use        Home Medications Prior to Admission medications   Medication Sig Start Date End Date Taking? Authorizing Provider  albuterol (VENTOLIN HFA) 108 (90 Base) MCG/ACT inhaler Inhale 2 puffs into the lungs every 6 (six) hours as needed for wheezing or shortness of breath. 09/13/21   Corwin LevinsJohn, James W, MD  Alcohol Swabs (B-D SINGLE USE SWABS BUTTERFLY) PADS Use up to 4 times a day to test blood sugars. Dx: E10.9, E11.9 09/30/20   Corwin LevinsJohn, James W, MD  amiodarone (PACERONE) 200 MG tablet Take 1 tablet (200 mg total) by mouth daily. 08/27/22   Swinyer, Zachary GeorgeMichelle M, NP  aspirin EC 81 MG tablet Take 1 tablet (81 mg total) by mouth daily. Swallow whole. 08/27/22   Swinyer,  Zachary GeorgeMichelle M, NP  atorvastatin (LIPITOR) 80 MG tablet Take 1 tablet (80 mg total) by mouth every evening. 08/27/22   Swinyer, Zachary GeorgeMichelle M, NP  Blood Glucose Calibration (TRUE METRIX LEVEL 1) Low SOLN Use as needed. Dx: E10.9, E11.9 07/27/22   Corwin LevinsJohn, James W, MD  Blood Glucose Monitoring Suppl (ONETOUCH VERIO FLEX SYSTEM) DEVI 1 Device by Does not apply route in the morning, at noon, in the evening, and at bedtime. E11.9 08/10/22   Corwin LevinsJohn, James W, MD  carvedilol (COREG) 6.25 MG tablet Take 1 tablet (6.25 mg total) by mouth 2 (two) times daily with a meal. 08/27/22   Swinyer, Zachary GeorgeMichelle M, NP  clopidogrel (PLAVIX) 75 MG tablet Take 1 tablet (75 mg total) by mouth daily. 08/27/22   Swinyer, Zachary GeorgeMichelle M, NP  dorzolamide-timolol (COSOPT) 2-0.5 % ophthalmic solution SMARTSIG:In Eye(s) 03/12/22   [provider]  empagliflozin (JARDIANCE) 25 MG TABS tablet TAKE 1 TABLET(25 MG) BY MOUTH DAILY BEFORE BREAKFAST 08/27/22   Swinyer, Zachary GeorgeMichelle M, NP  ezetimibe (ZETIA) 10 MG tablet Take 1 tablet (10 mg total) by mouth daily. 08/27/22   Swinyer, Zachary GeorgeMichelle M, NP  furosemide (LASIX) 40 MG tablet Take 1 tablet by mouth twice daily only if weight gain is more than 3lbs from baseline 09/13/22   Tonny Bollmanooper, Jase Reep, MD  gabapentin (NEURONTIN) 300 MG capsule Take 1 capsule (300 mg total) by mouth 3 (three) times daily  as needed. 07/27/22   Corwin Levins, MD  glucose blood [facility] VERIO) test strip Use as instructed four times per day E11.9 08/10/22   Corwin Levins, MD  isosorbide-hydrALAZINE (BIDIL) 20-37.5 MG tablet Take 1 tablet by mouth 3 (three) times daily. 08/27/22   Swinyer, Zachary George, NP  Lancets MISC Use as directed four times per day E11.9 08/10/22   Corwin Levins, MD  mexiletine (MEXITIL) 250 MG capsule Take 1 capsule (250 mg total) by mouth every 12 (twelve) hours. 09/14/22   Tonny Bollman, MD  nitroGLYCERIN (NITROSTAT) 0.4 MG SL tablet Place 1 tablet (0.4 mg total) under the tongue every 5 (five) minutes as needed for chest  pain. N 08/27/22   Swinyer, Zachary George, NP  pantoprazole (PROTONIX) 40 MG tablet Take 1 tablet (40 mg total) by mouth daily. 02/02/21   Corwin Levins, MD  PARoxetine (PAXIL) 20 MG tablet TAKE 1 TABLET(20 MG) BY MOUTH DAILY Patient taking differently: Take 20 mg by mouth daily. 02/14/22   Corwin Levins, MD  sacubitril-valsartan (ENTRESTO) 97-103 MG Take 1 tablet by mouth 2 (two) times daily. 09/05/22   Clegg, Amy D, NP  spironolactone (ALDACTONE) 25 MG tablet Take 1 tablet (25 mg total) by mouth daily. 08/27/22   Swinyer, Zachary George, NP  tirzepatide [facility]) 2.5 MG/0.5ML Pen Inject 2.5 mg into the skin once a week. E11.9 07/27/22   Corwin Levins, MD  vitamin B-12 (CYANOCOBALAMIN) 1000 MCG tablet Take 1 tablet (1,000 mcg total) by mouth daily. 11/25/19   Corwin Levins, MD      Allergies    Penicillin g and Penicillins    Review of Systems   Review of Systems  Constitutional:  Positive for chills, fatigue and fever.  Eyes:  Negative for visual disturbance.  Respiratory:  Positive for cough and shortness of breath. Negative for hemoptysis and sputum production.   Cardiovascular:  Negative for chest pain.  Gastrointestinal:  Positive for diarrhea. Negative for abdominal pain and vomiting.  Genitourinary:  Negative for dysuria.    Physical Exam Updated Vital Signs BP (!) 178/116   Pulse 96   Resp 19   SpO2 94%  Physical Exam Vitals and nursing note reviewed.  Constitutional:      General: He is not in acute distress.    Appearance: He is well-developed.  HENT:     Head: Normocephalic and atraumatic.  Eyes:     Conjunctiva/sclera: Conjunctivae normal.  Cardiovascular:     Rate and Rhythm: Normal rate and regular rhythm.     Heart sounds: No murmur heard. Pulmonary:     Effort: Pulmonary effort is normal. No respiratory distress.     Breath sounds: Normal breath sounds.  Abdominal:     Palpations: Abdomen is soft.     Tenderness: There is no abdominal tenderness.  Musculoskeletal:         General: No swelling.     Cervical back: Neck supple.     Right lower leg: No tenderness. No edema.     Left lower leg: No tenderness. No edema.  Skin:    General: Skin is warm and dry.     Capillary Refill: Capillary refill takes less than 2 seconds.  Neurological:     General: No focal deficit present.     Mental Status: He is alert.     ED Results / Procedures / Treatments   Labs (all labs ordered are listed, but only abnormal results are displayed) Labs Reviewed -  No data to display  EKG None  Radiology No results found.  Procedures Procedures  {Document cardiac monitor, telemetry assessment procedure when appropriate:1}  Medications Ordered in ED Medications - No data to display  ED Course/ Medical Decision Making/ A&P   {   Click here for ABCD2, HEART and other calculatorsREFRESH Note before signing :1}                          Medical Decision Making Amount and/or Complexity of Data Reviewed Labs: ordered. Radiology: ordered.   This patient complains of ***; this involves an extensive number of treatment Options and is a complaint that carries with it a high risk of complications and morbidity. The differential includes ***  I ordered, reviewed and interpreted labs, which included *** I ordered medication *** and reviewed PMP when indicated. I ordered imaging studies which included *** and I independently    visualized and interpreted imaging which showed *** Additional history obtained from *** Previous records obtained and reviewed *** I consulted *** and discussed lab and imaging findings and discussed disposition.  Cardiac monitoring reviewed, *** Social determinants considered, *** Critical Interventions: ***  After the interventions stated above, I reevaluated the patient and found *** Admission and further testing considered, ***   {Document critical care time when appropriate:1} {Document review of labs and clinical decision tools ie  heart score, Chads2Vasc2 etc:1}  {Document your independent review of radiology images, and any outside records:1} {Document your discussion with family members, caretakers, and with consultants:1} {Document social determinants of health affecting pt's care:1} {Document your decision making why or why not admission, treatments were needed:1} Final Clinical Impression(s) / ED Diagnoses Final diagnoses:  None    Rx / DC Orders ED Discharge Orders     None

## 2022-09-15 NOTE — ED Triage Notes (Signed)
Pt to the ed from home via ems with a CC of sob with laying flat. Pt relays he has been increasingly fatigued. Pt has pacemake placed 2 weeks prior. Pt denies cp, dizziness, loc, or any pain at this time.

## 2022-09-15 NOTE — ED Notes (Signed)
ED TO INPATIENT HANDOFF REPORT  ED Nurse Name and Phone #: Helmut Musterlicia [phone]  S Name/Age/Gender [name] [name] 67 y.o. male Room/Bed: 002C/002C  Code Status   Code Status: Full Code  Home/SNF/Other Home Patient oriented to: self, place, time, and situation Is this baseline? Yes   Triage Complete: Triage complete  Chief Complaint Acute cystitis [N30.00]  Triage Note Pt to the ed from home via ems with a CC of sob with laying flat. Pt relays he has been increasingly fatigued. Pt has pacemake placed 2 weeks prior. Pt denies cp, dizziness, loc, or any pain at this time.    Allergies Allergies  Allergen Reactions   Penicillin G Other (See Comments)    Did it involve swelling of the face/tongue/throat, SOB, or low BP?  N/A Did it involve sudden or severe rash/hives, skin peeling, or any reaction on the inside of your mouth or nose? N/A Did you need to seek medical attention at a hospital or doctor's office? N/A When did it last happen? Child If all above answers are "NO", may proceed with cephalosporin use.   Penicillins Other (See Comments)    Did it involve swelling of the face/tongue/throat, SOB, or low BP? N/A Did it involve sudden or severe rash/hives, skin peeling, or any reaction on the inside of your mouth or nose?N/A Did you need to seek medical attention at a hospital or doctor's office? N/A When did it last happen? Child      If all above answers are "NO", may proceed with cephalosporin use.    Level of Care/Admitting Diagnosis ED Disposition     ED Disposition  Admit   Condition  --   Comment  [facility] [[id number]]  Level of Care: Telemetry Medical [104]  [facility] patient in [facility] at [facility] if equivalent level of care is available:: No  Covid Evaluation: Asymptomatic - no recent exposure (last 10 days) testing not required  Diagnosis: Acute cystitis [595.0.ICD-9-CM]  Admitting Physician: Angie FavaHOWERTER, JUSTIN  B [[id number]]  Attending Physician: Angie FavaHOWERTER, JUSTIN B [[id number]]          B Medical/Surgery History Past Medical History:  Diagnosis Date   CAD (coronary artery disease)    a. s/p BMS pRCA 2002; b. DES to pRCA & DES p/m RCA 2006; c. PCI/DES OM4 2007; d. PCI/CBA to RCA for ISR 10/2006; e.08/2012 STEMI/Cath/PCI:  LAD 95% >> PCI: Promus Prem DES  //  f. NSTEMI 10/15 >> LHC: mLAD stent ok, dLAD 70, OM1 CTO, OM2 stent ok, dOM2 90, OM3 30-40, dLCx 90, p-mRCA stent ok, mRCA stent ok w/ 60-70 ISR, EF 50% >> med Rx // MV 1/17: no ischemia // Cath ([facility]) 05/2019: RCA 100 (CTO)    Combined systolic and diastolic CHF    a. Myoview 1/17: EF 35%  //  b. Echo 1/17 EF 45-50% // Echo 05/2019: EF 50-55 // Echocardiogram 8/21: EF 50, inf AK, post and mid inf HK, mod LVH, RVSP 25.3, mild to mod LAE, trivial MR    Depression    Diabetes mellitus    a. A1c 8.8 08/2012->Metformin initiated. => b. A1c (9/14): 6.6   Gastroesophageal reflux disease    History of nuclear stress test    a. Myoview 1/17: EF 35%, fixed inferior lateral defect consistent with infarct, no ischemia, intermediate risk   History of pneumonia    History of stroke 01/2021   Zio Monitor 11/22: NSR, avg HR 61; PVCs (1.9%); no AFib,  no high grade HB, no ventricular arrhythmias   HTN (hypertension)    Hx of NSTEMI    Hyperlipidemia    Ischemic cardiomyopathy    a. EF 40%; improved to normal;  b. 08/2012 Echo: EF 50-55%, mod LVH.//  c. Echo 9/16: Inf HK, mild LVH, EF 55%, mild LAE, normal RVF, mild RAE, PASP 35 mmHg  //  d. Echo 1/17: EF 45-50%, inferior HK, mild BAE, PASP 33 mmHg   Obesity    OSA (obstructive sleep apnea)    Does not use CPAP as of 05/2011   Tobacco abuse    Past Surgical History:  Procedure Laterality Date   BIV ICD INSERTION CRT-D N/A 08/14/2022   Procedure: BIV ICD INSERTION CRT-D;  Surgeon: Maurice Small, MD;  Location: [facility] INVASIVE CV LAB;  Service: Cardiovascular;  Laterality: N/A;   CORONARY ANGIOPLASTY WITH STENT  PLACEMENT     CORONARY STENT PLACEMENT  2009   LEFT HEART CATH AND CORONARY ANGIOGRAPHY N/A 08/13/2022   Procedure: LEFT HEART CATH AND CORONARY ANGIOGRAPHY;  Surgeon: Yvonne Kendall, MD;  Location: [facility] INVASIVE CV LAB;  Service: Cardiovascular;  Laterality: N/A;   LEFT HEART CATHETERIZATION WITH CORONARY ANGIOGRAM N/A 08/31/2012   Procedure: LEFT HEART CATHETERIZATION WITH CORONARY ANGIOGRAM;  Surgeon: Kathleene Hazel, MD;  Location: [facility] CATH LAB;  Service: Cardiovascular;  Laterality: N/A;   LEFT HEART CATHETERIZATION WITH CORONARY ANGIOGRAM N/A 11/16/2013   Procedure: LEFT HEART CATHETERIZATION WITH CORONARY ANGIOGRAM;  Surgeon: Micheline Chapman, MD;  Location: [facility] CATH LAB;  Service: Cardiovascular;  Laterality: N/A;   LEFT HEART CATHETERIZATION WITH CORONARY ANGIOGRAM N/A 03/15/2014   Procedure: LEFT HEART CATHETERIZATION WITH CORONARY ANGIOGRAM;  Surgeon: Peter M Swaziland, MD;  Location: [facility] CATH LAB;  Service: Cardiovascular;  Laterality: N/A;   PERCUTANEOUS CORONARY STENT INTERVENTION (PCI-S) Right 08/31/2012   Procedure: PERCUTANEOUS CORONARY STENT INTERVENTION (PCI-S);  Surgeon: Kathleene Hazel, MD;  Location: [facility] CATH LAB;  Service: Cardiovascular;  Laterality: Right;   PERCUTANEOUS CORONARY STENT INTERVENTION (PCI-S)  11/16/2013   Procedure: PERCUTANEOUS CORONARY STENT INTERVENTION (PCI-S);  Surgeon: Micheline Chapman, MD;  Location: [facility] CATH LAB;  Service: Cardiovascular;;     A IV Location/Drains/Wounds Patient Lines/Drains/Airways Status     Active Line/Drains/Airways     Name Placement date Placement time Site Days   Peripheral IV 09/15/22 22 G Left;Posterior Hand 09/15/22  --  Hand  less than 1   Peripheral IV 09/15/22 20 G Left;Posterior Forearm 09/15/22  --  Forearm  less than 1            Intake/Output Last 24 hours No intake or output data in the 24 hours ending 09/15/22 2113  Labs/Imaging Results for orders placed or performed during the hospital encounter of  09/15/22 (from the past 48 hour(s))  Urinalysis, w/ Reflex to Culture (Infection Suspected) -Urine, Clean Catch     Status: Abnormal   Collection Time: 09/15/22  6:47 PM  Result Value Ref Range   Specimen Source URINE, CLEAN CATCH    Color, Urine YELLOW YELLOW   APPearance HAZY (A) CLEAR   Specific Gravity, Urine 1.022 1.005 - 1.030   pH 5.0 5.0 - 8.0   Glucose, UA >=500 (A) NEGATIVE mg/dL   Hgb urine dipstick SMALL (A) NEGATIVE   Bilirubin Urine NEGATIVE NEGATIVE   Ketones, ur 20 (A) NEGATIVE mg/dL   Protein, ur >=154 (A) NEGATIVE mg/dL   Nitrite POSITIVE (A) NEGATIVE   Leukocytes,Ua SMALL (A) NEGATIVE   RBC /  HPF 0-5 0 - 5 RBC/hpf   WBC, UA >50 0 - 5 WBC/hpf    Comment:        Reflex urine culture not performed if WBC <=10, OR if Squamous epithelial cells >5. If Squamous epithelial cells >5 suggest recollection.    Bacteria, UA RARE (A) NONE SEEN   Squamous Epithelial / HPF 0-5 0 - 5 /HPF   Mucus PRESENT     Comment: Performed at [facility] Lab, [address] [place], [place] [address]  SARS Coronavirus 2 by RT PCR (hospital order, performed in [facility] hospital lab) *cepheid single result test* Anterior Nasal Swab     Status: None   Collection Time: 09/15/22  6:48 PM   Specimen: Anterior Nasal Swab  Result Value Ref Range   SARS Coronavirus 2 by RT PCR NEGATIVE NEGATIVE    Comment: Performed at [facility], [address], [place], [place] [address]  Lactic acid, plasma     Status: None   Collection Time: 09/15/22  6:51 PM  Result Value Ref Range   Lactic Acid, Venous 1.2 0.5 - 1.9 mmol/L    Comment: Performed at [facility], [address] [place], [place] [address]  Comprehensive metabolic panel     Status: Abnormal   Collection Time: 09/15/22  6:51 PM  Result Value Ref Range   Sodium 138 135 - 145 mmol/L   Potassium 3.9 3.5 - 5.1 mmol/L   Chloride 107 98 - 111 mmol/L   CO2 15 (L) 22 - 32 mmol/L   Glucose, Bld 91 70 - 99 mg/dL    Comment:  Glucose reference range applies only to samples taken after fasting for at least 8 hours.   BUN 13 8 - 23 mg/dL   Creatinine, Ser 9.14 (H) 0.61 - 1.24 mg/dL   Calcium 78.2 8.9 - 95.6 mg/dL   Total Protein 7.8 6.5 - 8.1 g/dL   Albumin 4.1 3.5 - 5.0 g/dL   AST 19 15 - 41 U/L   ALT 20 0 - 44 U/L   Alkaline Phosphatase 82 38 - 126 U/L   Total Bilirubin 1.1 0.3 - 1.2 mg/dL   GFR, Estimated 45 (L) >60 mL/min    Comment: (NOTE) Calculated using the CKD-EPI Creatinine Equation (2021)    Anion gap 16 (H) 5 - 15    Comment: Performed at [facility], [address], [place], [place] [address]  CBC with Differential     Status: Abnormal   Collection Time: 09/15/22  6:51 PM  Result Value Ref Range   WBC 9.1 4.0 - 10.5 K/uL   RBC 6.14 (H) 4.22 - 5.81 MIL/uL   Hemoglobin 17.8 (H) 13.0 - 17.0 g/dL   HCT 65.7 (H) 84.6 - 96.2 %   MCV 87.1 80.0 - 100.0 fL   MCH 29.0 26.0 - 34.0 pg   MCHC 33.3 30.0 - 36.0 g/dL   RDW 95.2 (H) 84.1 - 32.4 %   Platelets 185 150 - 400 K/uL   nRBC 0.0 0.0 - 0.2 %   Neutrophils Relative % 74 %   Neutro Abs 6.7 1.7 - 7.7 K/uL   Lymphocytes Relative 17 %   Lymphs Abs 1.6 0.7 - 4.0 K/uL   Monocytes Relative 9 %   Monocytes Absolute 0.8 0.1 - 1.0 K/uL   Eosinophils Relative 0 %   Eosinophils Absolute 0.0 0.0 - 0.5 K/uL   Basophils Relative 0 %   Basophils Absolute  0.0 0.0 - 0.1 K/uL   Immature Granulocytes 0 %   Abs Immature Granulocytes 0.02 0.00 - 0.07 K/uL    Comment: Performed at [facility], [address], [place], [place] [address]  Protime-INR     Status: None   Collection Time: 09/15/22  6:51 PM  Result Value Ref Range   Prothrombin Time 13.3 11.4 - 15.2 seconds   INR 1.0 0.8 - 1.2    Comment: (NOTE) INR goal varies based on device and disease states. Performed at [facility], [address] [place], [place] [address]   APTT     Status: None   Collection Time: 09/15/22  6:51 PM  Result Value Ref Range   aPTT 30 24 - 36 seconds     Comment: Performed at [facility], [address], [place], [place] [address]  Brain natriuretic peptide     Status: None   Collection Time: 09/15/22  6:51 PM  Result Value Ref Range   B Natriuretic Peptide 62.3 0.0 - 100.0 pg/mL    Comment: Performed at [facility], [address], [place], [place] [address]  Troponin I (High Sensitivity)     Status: Abnormal   Collection Time: 09/15/22  6:51 PM  Result Value Ref Range   Troponin I (High Sensitivity) 51 (H) <18 ng/L    Comment: (NOTE) Elevated high sensitivity troponin I (hsTnI) values and significant  changes across serial measurements may suggest ACS but many other  chronic and acute conditions are known to elevate hsTnI results.  Refer to the "Links" section for chest pain algorithms and additional  guidance. Performed at [facility], [address] [place], [place] [address]    *Note: Due to a large number of results and/or encounters for the requested time period, some results have not been displayed. A complete set of results can be found in Results Review.   DG Chest Port 1 View  Result Date: 09/15/2022 CLINICAL DATA:  Questionable sepsis EXAM: PORTABLE CHEST 1 VIEW COMPARISON:  Chest x-ray 08/15/2022 FINDINGS: The heart is enlarged. ICD is again seen. The lungs are clear. There is no pleural effusion or pneumothorax. No acute fractures are seen. IMPRESSION: Cardiomegaly. No acute pulmonary process. Electronically Signed   By: Darliss Cheney M.D.   On: 09/15/2022 19:36    Pending Labs Unresulted Labs (From admission, onward)     Start     Ordered   09/16/22 0500  CBC with Differential/Platelet  Tomorrow morning,   R        09/15/22 2055   09/16/22 0500  Comprehensive metabolic panel  Tomorrow morning,   R        09/15/22 2055   09/16/22 0500  Magnesium  Tomorrow morning,   R        09/15/22 2055   09/15/22 2056  Magnesium  Add-on,   AD        09/15/22 2055   09/15/22 1847  Lactic acid, plasma   (Undifferentiated presentation (screening labs and basic nursing orders))  Now then every 2 hours,   R      09/15/22 1847   09/15/22 1847  Blood Culture (routine x 2)  (Undifferentiated presentation (screening labs and basic nursing orders))  BLOOD CULTURE X 2,   STAT      09/15/22 1847   09/15/22 1847  Urine Culture  Once,   R        09/15/22 1847  Vitals/Pain Today's Vitals   09/15/22 1900 09/15/22 1955 09/15/22 2000 09/15/22 2106  BP:  (!) 184/113 (!) 186/121 (!) 160/96  Pulse:  88 86 85  Resp:  15 (!) 21 (!) 33  Temp: (!) 100.4 F (38 C)     TempSrc: Oral     SpO2:   96% 94%  Weight:      Height:      PainSc:        Isolation Precautions No active isolations  Medications Medications  cefTRIAXone (ROCEPHIN) 1 g in sodium chloride 0.9 % 100 mL IVPB (1 g Intravenous New Bag/Given 09/15/22 2101)  acetaminophen (TYLENOL) tablet 650 mg (has no administration in time range)    Or  acetaminophen (TYLENOL) suppository 650 mg (has no administration in time range)  melatonin tablet 3 mg (has no administration in time range)  ondansetron (ZOFRAN) injection 4 mg (has no administration in time range)  cefTRIAXone (ROCEPHIN) 1 g in sodium chloride 0.9 % 100 mL IVPB (has no administration in time range)    Mobility walks     Focused Assessments Cardiac Assessment Handoff:    Lab Results  Component Value Date   CKTOTAL 155 02/16/2022   CKMB 4.6 (H) 01/07/2011   TROPONINI 0.05 (HH) 05/30/2017   Lab Results  Component Value Date   DDIMER 0.71 (H) 05/29/2017   Does the Patient currently have chest pain? No   , Pulmonary Assessment Handoff:  Lung sounds:          R Recommendations: See Admitting Provider Note  Report given to:   Additional Notes:

## 2022-09-15 NOTE — H&P (Addendum)
History and Physical      [name] [name] ZOX:[id number] DOB: Jan 31, 1956 DOA: 09/15/2022  PCP: Corwin Levins, MD  Patient coming from: home   I have personally briefly reviewed patient's old medical records in [facility]  Chief Complaint: Generalized weakness  HPI: [name] [name] is a 67 y.o. male with medical history significant for chronic systolic/diastolic heart failure s/p biventricular ICD pacemaker/defibrillator placed in March 2024, type 2 diabetes mellitus, essential pretension, hyperlipidemia, obstructive sleep apnea noncompliant with home CPAP, paroxysmal ventricular tachycardia, CKD 3B associated baseline creatinine range 1.4-1.8, chronically elevated troponin, who is admitted to [facility] on 09/15/2022 with acute cystitis after presenting from home to [facility] ED complaining of generalized weakness.   The patient presents with 2 days of generalized weakness, in the absence of any acute focal weakness.  He notes increased somnolence, lethargy over that timeframe, as well as subjective fever in the absence of chills, full body rigors, or generalized myalgias.  Denies any associated headache, neck stiffness, rash, cough, abdominal pain, diarrhea, or dysuria.   He also denies any recent chest pain, shortness of breath, orthopnea, PND, worsening peripheral edema, palpitations, diaphoresis, dizziness, presyncope, or syncope.  His medical history is notable for chronic systolic/diastolic heart failure as well as paroxysmal ventricular tachycardia.  He has a documented history of chronic elevated troponin, which, per chart review, has been in the range of 37-1 46 since March 2021.  Most recent echocardiogram occurred on 08/12/2022 and was notable for LVEF 30 to 35%, grade 2 diastolic dysfunction, normal right ventricular systolic function, mildly dilated left atrium and trivial mitral regurgitation.      ED Course:  Vital signs in the ED were notable for the following: Temperature  max 100.4; heart rate 87-95; systolic blood pressures in the range of 170s to 180s; respiratory rate 15-23, oxygen saturation 94 to 96% on room air.  Labs were notable for the following: CMP notable for the following: Sodium 138, potassium 3.9, bicarbonate 15, anion gap 16, creatinine 1.67 unchanged from his recent prior value on 09/05/2022, glucose 91, calcium 10.1, liver enzymes within normal limits.  BNP 62 compared to most recent prior value of 95 on 08/11/2022.  High-sensitivity troponin I 51, relative to most recent prior value of 146 on 08/11/2022.  Lactic acid 1.2.  CBC notable for with cell count 9100 with 74% neutrophils.  INR 1.0.  Urinalysis is with a hazy appearing specimen and notable for greater than 50 white blood cells, positive nitrate, small leukocyte esterase, discussed with results, small hemoglobin without any RBCs, specific every 1.0-2 and 20 ketones.  Blood cultures x 2 as well as urine culture were collected prior to initiation of IV antibiotics.  COVID PCR negative.  Per my interpretation, EKG in ED demonstrated the following:, Relative to most recent prior EKG from 08/15/2022 shows paced rhythm with nonspecific T wave inversions in V1 through V3, which T wave version in V1 appears unchanged most recent prior EKG, and also shows no significance 1 mm ST depression in V2, without any evidence of ST elevation.  Imaging and additional notable ED work-up: 1 view chest x-ray, performed radiology read, shows no evidence of acute cardiopulmonary process, clearness of infiltrate, anemia, effusion, or pneumothorax.  While in the ED, the following were administered: Rocephin.  Subsequently, the patient was admitted for overnight observation for further evaluation and management of presenting acute cystitis in the setting of generalized weakness, with presenting labs also notable for anion gap metabolic acidosis.  Review of Systems: As per HPI otherwise 10 point review of systems negative.    Past Medical History:  Diagnosis Date   CAD (coronary artery disease)    a. s/p BMS pRCA 2002; b. DES to pRCA & DES p/m RCA 2006; c. PCI/DES OM4 2007; d. PCI/CBA to RCA for ISR 10/2006; e.08/2012 STEMI/Cath/PCI:  LAD 95% >> PCI: Promus Prem DES  //  f. NSTEMI 10/15 >> LHC: mLAD stent ok, dLAD 70, OM1 CTO, OM2 stent ok, dOM2 90, OM3 30-40, dLCx 90, p-mRCA stent ok, mRCA stent ok w/ 60-70 ISR, EF 50% >> med Rx // MV 1/17: no ischemia // Cath ([facility]) 05/2019: RCA 100 (CTO)    Combined systolic and diastolic CHF    a. Myoview 1/17: EF 35%  //  b. Echo 1/17 EF 45-50% // Echo 05/2019: EF 50-55 // Echocardiogram 8/21: EF 50, inf AK, post and mid inf HK, mod LVH, RVSP 25.3, mild to mod LAE, trivial MR    Depression    Diabetes mellitus    a. A1c 8.8 08/2012->Metformin initiated. => b. A1c (9/14): 6.6   Gastroesophageal reflux disease    History of nuclear stress test    a. Myoview 1/17: EF 35%, fixed inferior lateral defect consistent with infarct, no ischemia, intermediate risk   History of pneumonia    History of stroke 01/2021   Zio Monitor 11/22: NSR, avg HR 61; PVCs (1.9%); no AFib, no high grade HB, no ventricular arrhythmias   HTN (hypertension)    Hx of NSTEMI    Hyperlipidemia    Ischemic cardiomyopathy    a. EF 40%; improved to normal;  b. 08/2012 Echo: EF 50-55%, mod LVH.//  c. Echo 9/16: Inf HK, mild LVH, EF 55%, mild LAE, normal RVF, mild RAE, PASP 35 mmHg  //  d. Echo 1/17: EF 45-50%, inferior HK, mild BAE, PASP 33 mmHg   Obesity    OSA (obstructive sleep apnea)    Does not use CPAP as of 05/2011   Tobacco abuse     Past Surgical History:  Procedure Laterality Date   BIV ICD INSERTION CRT-D N/A 08/14/2022   Procedure: BIV ICD INSERTION CRT-D;  Surgeon: Maurice Small, MD;  Location: [facility] INVASIVE CV LAB;  Service: Cardiovascular;  Laterality: N/A;   CORONARY ANGIOPLASTY WITH STENT PLACEMENT     CORONARY STENT PLACEMENT  2009   LEFT HEART CATH AND CORONARY ANGIOGRAPHY N/A 08/13/2022    Procedure: LEFT HEART CATH AND CORONARY ANGIOGRAPHY;  Surgeon: Yvonne Kendall, MD;  Location: [facility] INVASIVE CV LAB;  Service: Cardiovascular;  Laterality: N/A;   LEFT HEART CATHETERIZATION WITH CORONARY ANGIOGRAM N/A 08/31/2012   Procedure: LEFT HEART CATHETERIZATION WITH CORONARY ANGIOGRAM;  Surgeon: Kathleene Hazel, MD;  Location: [facility] CATH LAB;  Service: Cardiovascular;  Laterality: N/A;   LEFT HEART CATHETERIZATION WITH CORONARY ANGIOGRAM N/A 11/16/2013   Procedure: LEFT HEART CATHETERIZATION WITH CORONARY ANGIOGRAM;  Surgeon: Micheline Chapman, MD;  Location: [facility] CATH LAB;  Service: Cardiovascular;  Laterality: N/A;   LEFT HEART CATHETERIZATION WITH CORONARY ANGIOGRAM N/A 03/15/2014   Procedure: LEFT HEART CATHETERIZATION WITH CORONARY ANGIOGRAM;  Surgeon: Peter M Swaziland, MD;  Location: [facility] CATH LAB;  Service: Cardiovascular;  Laterality: N/A;   PERCUTANEOUS CORONARY STENT INTERVENTION (PCI-S) Right 08/31/2012   Procedure: PERCUTANEOUS CORONARY STENT INTERVENTION (PCI-S);  Surgeon: Kathleene Hazel, MD;  Location: [facility] CATH LAB;  Service: Cardiovascular;  Laterality: Right;   PERCUTANEOUS CORONARY STENT INTERVENTION (PCI-S)  11/16/2013   Procedure: PERCUTANEOUS CORONARY  STENT INTERVENTION (PCI-S);  Surgeon: Micheline Chapman, MD;  Location: [facility] CATH LAB;  Service: Cardiovascular;;    Social History:  reports that he has quit smoking. His smoking use included cigarettes. He has a 15.00 pack-year smoking history. He has never used smokeless tobacco. He reports that he does not drink alcohol and does not use drugs.   Allergies  Allergen Reactions   Penicillin G Other (See Comments)    Did it involve swelling of the face/tongue/throat, SOB, or low BP?  N/A Did it involve sudden or severe rash/hives, skin peeling, or any reaction on the inside of your mouth or nose? N/A Did you need to seek medical attention at a hospital or doctor's office? N/A When did it last happen? Child If all above  answers are "NO", may proceed with cephalosporin use.   Penicillins Other (See Comments)    Did it involve swelling of the face/tongue/throat, SOB, or low BP? N/A Did it involve sudden or severe rash/hives, skin peeling, or any reaction on the inside of your mouth or nose?N/A Did you need to seek medical attention at a hospital or doctor's office? N/A When did it last happen? Child      If all above answers are "NO", may proceed with cephalosporin use.    Family History  Problem Relation Age of Onset   Depression Mother    Cancer Mother        ovarian   Hypertension Mother        Died, 55   Other Father        motor vehicle accident   Coronary artery disease Other    Heart attack Neg Hx    Stroke Neg Hx     Family history reviewed and not pertinent    Prior to Admission medications   Medication Sig Start Date End Date Taking? Authorizing Provider  albuterol (VENTOLIN HFA) 108 (90 Base) MCG/ACT inhaler Inhale 2 puffs into the lungs every 6 (six) hours as needed for wheezing or shortness of breath. 09/13/21   Corwin Levins, MD  Alcohol Swabs (B-D SINGLE USE SWABS BUTTERFLY) PADS Use up to 4 times a day to test blood sugars. Dx: E10.9, E11.9 09/30/20   Corwin Levins, MD  amiodarone (PACERONE) 200 MG tablet Take 1 tablet (200 mg total) by mouth daily. 08/27/22   Swinyer, Zachary George, NP  aspirin EC 81 MG tablet Take 1 tablet (81 mg total) by mouth daily. Swallow whole. 08/27/22   Swinyer, Zachary George, NP  atorvastatin (LIPITOR) 80 MG tablet Take 1 tablet (80 mg total) by mouth every evening. 08/27/22   Swinyer, Zachary George, NP  Blood Glucose Calibration (TRUE METRIX LEVEL 1) Low SOLN Use as needed. Dx: E10.9, E11.9 07/27/22   Corwin Levins, MD  Blood Glucose Monitoring Suppl (ONETOUCH VERIO FLEX SYSTEM) DEVI 1 Device by Does not apply route in the morning, at noon, in the evening, and at bedtime. E11.9 08/10/22   Corwin Levins, MD  carvedilol (COREG) 6.25 MG tablet Take 1 tablet (6.25 mg total) by  mouth 2 (two) times daily with a meal. 08/27/22   Swinyer, Zachary George, NP  clopidogrel (PLAVIX) 75 MG tablet Take 1 tablet (75 mg total) by mouth daily. 08/27/22   Swinyer, Zachary George, NP  dorzolamide-timolol (COSOPT) 2-0.5 % ophthalmic solution SMARTSIG:In Eye(s) 03/12/22   [provider]  empagliflozin (JARDIANCE) 25 MG TABS tablet TAKE 1 TABLET(25 MG) BY MOUTH DAILY BEFORE BREAKFAST 08/27/22   Swinyer, Marcelino Duster  M, NP  ezetimibe (ZETIA) 10 MG tablet Take 1 tablet (10 mg total) by mouth daily. 08/27/22   Swinyer, Zachary George, NP  furosemide (LASIX) 40 MG tablet Take 1 tablet by mouth twice daily only if weight gain is more than 3lbs from baseline 09/13/22   Tonny Bollman, MD  gabapentin (NEURONTIN) 300 MG capsule Take 1 capsule (300 mg total) by mouth 3 (three) times daily as needed. 07/27/22   Corwin Levins, MD  glucose blood [facility] VERIO) test strip Use as instructed four times per day E11.9 08/10/22   Corwin Levins, MD  isosorbide-hydrALAZINE (BIDIL) 20-37.5 MG tablet Take 1 tablet by mouth 3 (three) times daily. 08/27/22   Swinyer, Zachary George, NP  Lancets MISC Use as directed four times per day E11.9 08/10/22   Corwin Levins, MD  mexiletine (MEXITIL) 250 MG capsule Take 1 capsule (250 mg total) by mouth every 12 (twelve) hours. 09/14/22   Tonny Bollman, MD  nitroGLYCERIN (NITROSTAT) 0.4 MG SL tablet Place 1 tablet (0.4 mg total) under the tongue every 5 (five) minutes as needed for chest pain. N 08/27/22   Swinyer, Zachary George, NP  pantoprazole (PROTONIX) 40 MG tablet Take 1 tablet (40 mg total) by mouth daily. 02/02/21   Corwin Levins, MD  PARoxetine (PAXIL) 20 MG tablet TAKE 1 TABLET(20 MG) BY MOUTH DAILY Patient taking differently: Take 20 mg by mouth daily. 02/14/22   Corwin Levins, MD  sacubitril-valsartan (ENTRESTO) 97-103 MG Take 1 tablet by mouth 2 (two) times daily. 09/05/22   Clegg, Amy D, NP  spironolactone (ALDACTONE) 25 MG tablet Take 1 tablet (25 mg total) by mouth daily. 08/27/22    Swinyer, Zachary George, NP  tirzepatide [facility]) 2.5 MG/0.5ML Pen Inject 2.5 mg into the skin once a week. E11.9 07/27/22   Corwin Levins, MD  vitamin B-12 (CYANOCOBALAMIN) 1000 MCG tablet Take 1 tablet (1,000 mcg total) by mouth daily. 11/25/19   Corwin Levins, MD     Objective    Physical Exam: Vitals:   09/15/22 1900 09/15/22 1955 09/15/22 2000 09/15/22 2106  BP:  (!) 184/113 (!) 186/121 (!) 160/96  Pulse:  88 86 85  Resp:  15 (!) 21 (!) 33  Temp: (!) 100.4 F (38 C)     TempSrc: Oral     SpO2:   96% 94%  Weight:      Height:        General: appears to be stated age; alert, oriented Skin: warm, dry, no rash Head:  AT/[place] Mouth:  Oral mucosa membranes appear dry, normal dentition Neck: supple; trachea midline Heart:  RRR; did not appreciate any M/R/G Lungs: CTAB, did not appreciate any wheezes, rales, or rhonchi Abdomen: + BS; soft, ND, NT Vascular: 2+ pedal pulses b/l; 2+ radial pulses b/l Extremities: no peripheral edema, no muscle wasting Neuro: strength and sensation intact in upper and lower extremities b/l     Labs on Admission: I have personally reviewed following labs and imaging studies  CBC: Recent Labs  Lab 09/15/22 1851  WBC 9.1  NEUTROABS 6.7  HGB 17.8*  HCT 53.5*  MCV 87.1  PLT 185   Basic Metabolic Panel: Recent Labs  Lab 09/15/22 1851  NA 138  K 3.9  CL 107  CO2 15*  GLUCOSE 91  BUN 13  CREATININE 1.67*  CALCIUM 10.1   GFR: Estimated Creatinine Clearance: 65.6 mL/min (A) (by C-G formula based on SCr of 1.67 mg/dL (H)). Liver Function Tests:  Recent Labs  Lab 09/15/22 1851  AST 19  ALT 20  ALKPHOS 82  BILITOT 1.1  PROT 7.8  ALBUMIN 4.1   No results for input(s): "LIPASE", "AMYLASE" in the last 168 hours. No results for input(s): "AMMONIA" in the last 168 hours. Coagulation Profile: Recent Labs  Lab 09/15/22 1851  INR 1.0   Cardiac Enzymes: No results for input(s): "CKTOTAL", "CKMB", "CKMBINDEX", "TROPONINI" in the last  168 hours. BNP (last 3 results) Recent Labs    10/27/21 1207  PROBNP 114   HbA1C: No results for input(s): "HGBA1C" in the last 72 hours. CBG: No results for input(s): "GLUCAP" in the last 168 hours. Lipid Profile: No results for input(s): "CHOL", "HDL", "LDLCALC", "TRIG", "CHOLHDL", "LDLDIRECT" in the last 72 hours. Thyroid Function Tests: No results for input(s): "TSH", "T4TOTAL", "FREET4", "T3FREE", "THYROIDAB" in the last 72 hours. Anemia Panel: No results for input(s): "VITAMINB12", "FOLATE", "FERRITIN", "TIBC", "IRON", "RETICCTPCT" in the last 72 hours. Urine analysis:    Component Value Date/Time   COLORURINE YELLOW 09/15/2022 1847   APPEARANCEUR HAZY (A) 09/15/2022 1847   LABSPEC 1.022 09/15/2022 1847   PHURINE 5.0 09/15/2022 1847   GLUCOSEU >=500 (A) 09/15/2022 1847   GLUCOSEU >=1000 (A) 07/27/2022 0904   HGBUR SMALL (A) 09/15/2022 1847   HGBUR negative 08/23/2009 0000   BILIRUBINUR NEGATIVE 09/15/2022 1847   KETONESUR 20 (A) 09/15/2022 1847   PROTEINUR >=300 (A) 09/15/2022 1847   UROBILINOGEN 0.2 07/27/2022 0904   NITRITE POSITIVE (A) 09/15/2022 1847   LEUKOCYTESUR SMALL (A) 09/15/2022 1847    Radiological Exams on Admission: DG Chest Port 1 View  Result Date: 09/15/2022 CLINICAL DATA:  Questionable sepsis EXAM: PORTABLE CHEST 1 VIEW COMPARISON:  Chest x-ray 08/15/2022 FINDINGS: The heart is enlarged. ICD is again seen. The lungs are clear. There is no pleural effusion or pneumothorax. No acute fractures are seen. IMPRESSION: Cardiomegaly. No acute pulmonary process. Electronically Signed   By: Darliss CheneyAmy  Guttmann M.D.   On: 09/15/2022 19:36      Assessment/Plan   Principal Problem:   Acute cystitis Active Problems:   Depression   Chronic combined systolic and diastolic heart failure   Hyperlipidemia   Essential hypertension   CKD stage 3b, GFR 30-44 ml/min   Paroxysmal ventricular tachycardia   Generalized weakness   High anion gap metabolic  acidosis    #) Acute cystitis: 2 doses of invasive 2 days of new onset generalized weakness, lethargy, subjective fever, with mildly elevated temperature of 100.4 in the ED, with presenting urinalysis suggestive urinary tract infection in the setting of hazy appearing specimen with significant pyuria, positive nitrite findings, and no evidence this, epithelial cells to suggest a contaminated specimen.  In the absence of fever greater than 38.3 Celsius in the absence of leukocytosis, SIRS criteria not met for sepsis at this time.  Of note, initial lactate nonelevated at 1.2.  No evidence of hypotension.  Blood culture sent sooner culture were collected prior to initiation of Rocephin, which will be continued for now for coverage of urinary tract infection.  No evidence of additional underlying factious process at this time, including chest x-ray, which is no evidence of acute cardiopulmonary process, including numbness of imaging, while COVID PCR was found to be negative.  Plan: Monitor for results of blood cultures x 2 as well as urine culture.  Continue Rocephin.  Repeat CBC with differential morning.  Procalcitonin level.            #) Generalized weakness: 2-day duration  of generalized weakness, in the absence of any evidence of acute focal neurologic deficits, including no evidence of acute focal weakness to suggest acute CVA.  Suspect contribution from physiologic stress stemming from presenting acute cystitis.  No e/o additional infectious process at this time, as further outlined above.  Will add on procalcitonin level to ensure the presenting chest x-ray, which shows no evidence of acute cardiopulmonary process, is not associate with any false negative findings given some clinical evidence of mild intravascular depletion.  Of note, as urinalysis shows evidence of small hemoglobin but no RBCs, and while CMP shows anion gap metabolic acidosis, will also add on CPK level. Will further  eval for any additional contributions from endocrine/metabolic sources, as detailed below.    Plan: work-up and management of presenting acute cystitis, as described above. PT consult ordered for the AM. Fall precautions. CMP/CBC in the AM. Check TSH, serum Mg level. Check CPK level, as above.  Check B12 level.  Add on procalcitonin level.            #) Anion gap metabolic acidosis, as evidenced by laboratory results from presenting CMP, as further quantified above.  Potential contribution from relative ketosis given the presence of 20 ketones on urinalysis, likely as consequence of diminished oral intake over the last 1 to 2 days, without any evidence to suggest DKA, also noting presenting blood sugar of 91.  As noted above, in the setting of urinalysis showing small hemoglobin but no RBCs, will also add on CPK level.  As established will, presentation is not associate with sepsis, and initial lactate is nonelevated.  Plan: Check CPK level, salicylate level.  Repeat CMP in the morning.  Monitor strict I's and O's Daily weights.               #) Chronic combined systolic and diastolic heart failure: documented history of such, with most recent echocardiogram performed on 08/12/2022 notable for LVEF 30 to 35%, grade 2 diastolic function, with additional details as conveyed above.  Status post ICD placement in the first week of March 2024.  No clinical or evidence to suggest acutely decompensated heart failure at this time, while noting a one third reduction in presenting BNP relative to most recent prior, as further quantified above. Patient's home diuretic regimen reportedly consists of the following: Scheduled spironolactone as well as as needed Lasix. Home cardiac medications also include the following: Coreg, Entresto.  He is also on empagliflozin as an outpatient.   Plan: monitor strict I's & O's and daily weights. Repeat CMP in the morning. Check serum magnesium level. Continue  home diuretic regimen. Continue home Entresto, Coreg.             #) Essential Hypertension: documented h/o such, with outpatient antihypertensive regimen including Entresto, Coreg, isosorbide, hydralazine, spironolactone.  SBP's in the ED today: 170s to 180s mmHg.   Plan: Close monitoring of subsequent BP via routine VS. resume home Entresto, Coreg, spironolactone, Dralzine, isosorbide.                #) Hyperlipidemia: documented h/o such. On high intensity atorvastatin as well as Zetia as outpatient.   Plan: continue home statin and Zetia.               #) History of paroxysmal ventricular tachycardia: Documented history as such, status post biventricular ICD/defibrillator on 08/14/2022.  Presenting EKG reflects paced rhythm.  Of note, he is on amiodarone as well as mexiletine as outpatient.   Plan:  Monitor on symmetry.  Continue home  amiodarone as well as mexiletine .  Check serum magnesium level.  CMP in the morning.           #) Type 2 Diabetes Mellitus: documented history of such. Home insulin regimen: None. Home oral hypoglycemic agents: Empagliflozin.  He is also on [facility] as an outpatient. presenting blood sugar: 91. Most recent A1c noted to be 7.3% when checked on 07/27/2022.   Plan: accuchecks QAC and HS with low dose SSI. hold home oral hypoglycemic agents during this hospitalization.  Will also hold home Mounjaro during this hospitalization.            #) Depression: documented h/o such. On Paxil as outpatient.    Plan: Continue home Paxil.              #) CKD Stage 3B: Documented history of such, with baseline creatinine 1.4-1.8, with presenting creatinine consistent with this baseline.    Plan: Monitor strict I's and O's and daily weights.  Attempt to avoid nephrotoxic agents.  CMP/magnesium level in the AM.         DVT prophylaxis: SCD's   Code Status: Full code Family Communication:  none Disposition Plan: Per Rounding Team Consults called: none;  Admission status: Observation     I SPENT GREATER THAN 75  MINUTES IN CLINICAL CARE TIME/MEDICAL DECISION-MAKING IN COMPLETING THIS ADMISSION.      Chaney Born Ricquel Foulk DO [facility]  From 7PM - 7AM   09/15/2022, 9:34 PM

## 2022-09-16 DIAGNOSIS — I2510 Atherosclerotic heart disease of native coronary artery without angina pectoris: Secondary | ICD-10-CM | POA: Diagnosis present

## 2022-09-16 DIAGNOSIS — E8729 Other acidosis: Secondary | ICD-10-CM | POA: Diagnosis present

## 2022-09-16 DIAGNOSIS — Z955 Presence of coronary angioplasty implant and graft: Secondary | ICD-10-CM | POA: Diagnosis not present

## 2022-09-16 DIAGNOSIS — E861 Hypovolemia: Secondary | ICD-10-CM | POA: Diagnosis present

## 2022-09-16 DIAGNOSIS — N390 Urinary tract infection, site not specified: Secondary | ICD-10-CM | POA: Diagnosis not present

## 2022-09-16 DIAGNOSIS — I480 Paroxysmal atrial fibrillation: Secondary | ICD-10-CM | POA: Diagnosis present

## 2022-09-16 DIAGNOSIS — Z1152 Encounter for screening for COVID-19: Secondary | ICD-10-CM | POA: Diagnosis not present

## 2022-09-16 DIAGNOSIS — Z87891 Personal history of nicotine dependence: Secondary | ICD-10-CM | POA: Diagnosis not present

## 2022-09-16 DIAGNOSIS — E1122 Type 2 diabetes mellitus with diabetic chronic kidney disease: Secondary | ICD-10-CM | POA: Diagnosis present

## 2022-09-16 DIAGNOSIS — I5042 Chronic combined systolic (congestive) and diastolic (congestive) heart failure: Secondary | ICD-10-CM | POA: Diagnosis not present

## 2022-09-16 DIAGNOSIS — Z880 Allergy status to penicillin: Secondary | ICD-10-CM | POA: Diagnosis not present

## 2022-09-16 DIAGNOSIS — N179 Acute kidney failure, unspecified: Secondary | ICD-10-CM | POA: Diagnosis not present

## 2022-09-16 DIAGNOSIS — E785 Hyperlipidemia, unspecified: Secondary | ICD-10-CM | POA: Diagnosis present

## 2022-09-16 DIAGNOSIS — Z8673 Personal history of transient ischemic attack (TIA), and cerebral infarction without residual deficits: Secondary | ICD-10-CM | POA: Diagnosis not present

## 2022-09-16 DIAGNOSIS — N189 Chronic kidney disease, unspecified: Secondary | ICD-10-CM | POA: Diagnosis not present

## 2022-09-16 DIAGNOSIS — I255 Ischemic cardiomyopathy: Secondary | ICD-10-CM | POA: Diagnosis present

## 2022-09-16 DIAGNOSIS — Z95810 Presence of automatic (implantable) cardiac defibrillator: Secondary | ICD-10-CM | POA: Diagnosis not present

## 2022-09-16 DIAGNOSIS — N1832 Chronic kidney disease, stage 3b: Secondary | ICD-10-CM | POA: Diagnosis not present

## 2022-09-16 DIAGNOSIS — F32A Depression, unspecified: Secondary | ICD-10-CM | POA: Diagnosis present

## 2022-09-16 DIAGNOSIS — I130 Hypertensive heart and chronic kidney disease with heart failure and stage 1 through stage 4 chronic kidney disease, or unspecified chronic kidney disease: Secondary | ICD-10-CM | POA: Diagnosis present

## 2022-09-16 DIAGNOSIS — I252 Old myocardial infarction: Secondary | ICD-10-CM | POA: Diagnosis not present

## 2022-09-16 DIAGNOSIS — I472 Ventricular tachycardia, unspecified: Secondary | ICD-10-CM | POA: Diagnosis present

## 2022-09-16 DIAGNOSIS — I4729 Other ventricular tachycardia: Secondary | ICD-10-CM | POA: Diagnosis not present

## 2022-09-16 DIAGNOSIS — I10 Essential (primary) hypertension: Secondary | ICD-10-CM | POA: Diagnosis not present

## 2022-09-16 DIAGNOSIS — N3000 Acute cystitis without hematuria: Secondary | ICD-10-CM | POA: Diagnosis not present

## 2022-09-16 DIAGNOSIS — B9620 Unspecified Escherichia coli [E. coli] as the cause of diseases classified elsewhere: Secondary | ICD-10-CM | POA: Diagnosis present

## 2022-09-16 DIAGNOSIS — K219 Gastro-esophageal reflux disease without esophagitis: Secondary | ICD-10-CM | POA: Diagnosis present

## 2022-09-16 DIAGNOSIS — E782 Mixed hyperlipidemia: Secondary | ICD-10-CM | POA: Diagnosis not present

## 2022-09-16 DIAGNOSIS — Z79899 Other long term (current) drug therapy: Secondary | ICD-10-CM | POA: Diagnosis not present

## 2022-09-16 LAB — CBC WITH DIFFERENTIAL/PLATELET
Abs Immature Granulocytes: 0.03 10*3/uL (ref 0.00–0.07)
Basophils Absolute: 0 10*3/uL (ref 0.0–0.1)
Basophils Relative: 0 %
Eosinophils Absolute: 0 10*3/uL (ref 0.0–0.5)
Eosinophils Relative: 0 %
HCT: 51.4 % (ref 39.0–52.0)
Hemoglobin: 17.3 g/dL — ABNORMAL HIGH (ref 13.0–17.0)
Immature Granulocytes: 0 %
Lymphocytes Relative: 18 %
Lymphs Abs: 1.9 10*3/uL (ref 0.7–4.0)
MCH: 29 pg (ref 26.0–34.0)
MCHC: 33.7 g/dL (ref 30.0–36.0)
MCV: 86.2 fL (ref 80.0–100.0)
Monocytes Absolute: 0.9 10*3/uL (ref 0.1–1.0)
Monocytes Relative: 9 %
Neutro Abs: 7.8 10*3/uL — ABNORMAL HIGH (ref 1.7–7.7)
Neutrophils Relative %: 73 %
Platelets: 173 10*3/uL (ref 150–400)
RBC: 5.96 MIL/uL — ABNORMAL HIGH (ref 4.22–5.81)
RDW: 16 % — ABNORMAL HIGH (ref 11.5–15.5)
WBC: 10.8 10*3/uL — ABNORMAL HIGH (ref 4.0–10.5)
nRBC: 0 % (ref 0.0–0.2)

## 2022-09-16 LAB — COMPREHENSIVE METABOLIC PANEL
ALT: 19 U/L (ref 0–44)
AST: 15 U/L (ref 15–41)
Albumin: 3.8 g/dL (ref 3.5–5.0)
Alkaline Phosphatase: 80 U/L (ref 38–126)
Anion gap: 18 — ABNORMAL HIGH (ref 5–15)
BUN: 16 mg/dL (ref 8–23)
CO2: 16 mmol/L — ABNORMAL LOW (ref 22–32)
Calcium: 9.9 mg/dL (ref 8.9–10.3)
Chloride: 104 mmol/L (ref 98–111)
Creatinine, Ser: 1.73 mg/dL — ABNORMAL HIGH (ref 0.61–1.24)
GFR, Estimated: 43 mL/min — ABNORMAL LOW (ref >60)
Glucose, Bld: 99 mg/dL (ref 70–99)
Potassium: 3.9 mmol/L (ref 3.5–5.1)
Sodium: 138 mmol/L (ref 135–145)
Total Bilirubin: 1.4 mg/dL — ABNORMAL HIGH (ref 0.3–1.2)
Total Protein: 7.6 g/dL (ref 6.5–8.1)

## 2022-09-16 LAB — GLUCOSE, CAPILLARY
Glucose-Capillary: 99 mg/dL (ref 70–99)
Glucose-Capillary: 144 mg/dL — ABNORMAL HIGH (ref 70–99)
Glucose-Capillary: 126 mg/dL — ABNORMAL HIGH (ref 70–99)
Glucose-Capillary: 122 mg/dL — ABNORMAL HIGH (ref 70–99)

## 2022-09-16 LAB — PROCALCITONIN: Procalcitonin: 0.14 ng/mL

## 2022-09-16 LAB — MAGNESIUM: Magnesium: 1.9 mg/dL (ref 1.7–2.4)

## 2022-09-16 LAB — TSH: TSH: 0.924 u[IU]/mL (ref 0.350–4.500)

## 2022-09-16 LAB — VITAMIN B12: Vitamin B-12: 313 pg/mL (ref 180–914)

## 2022-09-16 LAB — CULTURE, BLOOD (ROUTINE X 2): Special Requests: ADEQUATE

## 2022-09-16 MED ORDER — POLYETHYLENE GLYCOL 3350 17 G PO PACK
17.0000 g | PACK | Freq: Every day | ORAL | Status: DC
  Administered 2022-09-16 – 2022-09-18 (×3): 17 g via ORAL
  Filled 2022-09-16 (×3): qty 1

## 2022-09-16 NOTE — Progress Notes (Addendum)
Progress Note   Patient: [name] [name] KHT:[id number] DOB: May 09, 1956 DOA: 09/15/2022     0 DOS: the patient was seen and examined on 09/16/2022   Brief hospital course: [name] was admitted to the hospital with the working diagnosis of acute cystitis/ urinary tract infection   67 yo male with the past medical history of heart failure, recent ICD implantation 08/2022, T2DM, hypertension, paroxysmal atrial fibrillation, CKD and dyslipidemia who presented with generalized weakness. Reported 2 days of generalized weakness, somnolence and lethargy, associated subjective fevers and chills. No abdominal pian, no dysuria or increased urinary frequency. On his initial physical examination his blood pressure was 184/113, HR 88, RR 21 and temp 100.4, 02 saturation 94%, dry mucous membranes, heart with S1 and S2 present and rhythmic with no gallops, rubs or murmurs, abdomen with no distention and no lower extremity edema.   Na 138, K 3.9 Cl 107 bicarbonate 15 glucose 91 bun 13 cr 1,67  Anion gap 16. Mg 2,0  High sensitive troponin 51 and 61 Wbc 9,1 hgb 17,8 plt 185  Sars covid 19 negative   Urine analysis SG 1,022, >300 protein, positive ketones, > 50 wbc   Chest radiograph with cardiomegaly with no infiltrates. Pacemaker defibrillator in place with one atrial lead and biventricular leads.   EKG 95 bpm, right axis deviation, qtc 501, atrial sensed and ventricular paced rhythm, with no significant ST segment or T wave changes.     Assessment and Plan: * Acute cystitis At home patient had no dysuria but dark color urine. Today not back to baseline. Wbc is up to 10,8  Plan to continue antibiotic therapy today to cover urinary tract infection Follow up on cultures, cell count and temperature curve.   Chronic combined systolic and diastolic heart failure No clinical signs of exacerbation, plan to continue medical therapy with carvedilol, spironolactone, entresto and bidil. Continue to hold on loop  diuretic for now.  Clinically patient is euvolemic.     Essential hypertension Continue blood pressure control with entresto, bidil and carvedilol.  Continue spironolactone.   CKD stage 3b, GFR 30-44 ml/min Renal function with serum cr at 1,73 with K at 3,9 and serum bicarbonate at 16.  Positive anion gap metabolic acidosis, positive ketones on urine analysis.  Glucose is 99.   Plan to continue supportive care and follow up renal function in am, possible starvation ketosis.   Hyperlipidemia Continue statin therapy.   Paroxysmal ventricular tachycardia Continue mexiletine and telemetry monitoring.         Subjective: Patient is feeling better, but not back to baseline, no further fever or chills   Physical Exam: Vitals:   09/16/22 0035 09/16/22 0339 09/16/22 0726 09/16/22 1340  BP: 108/75 107/78 120/80 102/73  Pulse: 80 74 67 65  Resp: 18 18 18 18   Temp: 98.1 F (36.7 C) 98 F (36.7 C) 98 F (36.7 C)   TempSrc: Oral Oral Oral   SpO2: 92% 95% 94% 96%  Weight:  133.2 kg    Height:       Neurology awake and alert ENT with mild pallor Cardiovascular with S1 and S2 present and rhythmic with no gallops, rubs or murmurs Respiratory with no rales or wheezing Abdomen with no distention  Data Reviewed:    Family Communication: no family at the bedside   Disposition: Status is: Observation The patient will require care spanning > 2 midnights and should be moved to inpatient because: IV antibiotic therapy.  Planned Discharge Destination: Home  Author: Coralie Keens, MD 09/16/2022 2:39 PM  For on call review [email or web address].

## 2022-09-16 NOTE — Assessment & Plan Note (Deleted)
UTI At home patient had no dysuria but dark color urine.  Urine culture result is pending.  Plan to continue antibiotic, will transition to cephalexin po. Follow up on cultures, cell count and temperature curve.

## 2022-09-16 NOTE — Assessment & Plan Note (Signed)
Continue mexiletine with good toleration.

## 2022-09-16 NOTE — Hospital Course (Addendum)
Mr. Rumler was admitted to the hospital with the working diagnosis of acute cystitis/ urinary tract infection, AKI.   67 yo male with the past medical history of heart failure, recent ICD implantation 08/2022, T2DM, hypertension, paroxysmal atrial fibrillation, CKD and dyslipidemia who presented with generalized weakness. Reported 2 days of generalized weakness, somnolence, lethargy, and dark urine, associated subjective fevers and chills. No abdominal pian, no dysuria or increased urinary frequency. On his initial physical examination his blood pressure was 184/113, HR 88, RR 21 and temp 100.4, 02 saturation 94%, dry mucous membranes, heart with S1 and S2 present and rhythmic with no gallops, rubs or murmurs, abdomen with no distention and no lower extremity edema.   Na 138, K 3.9 Cl 107 bicarbonate 15 glucose 91 bun 13 cr 1,67  Anion gap 16. Mg 2,0  High sensitive troponin 51 and 61 Wbc 9,1 hgb 17,8 plt 185  Sars covid 19 negative   Urine analysis SG 1,022, >300 protein, positive ketones, > 50 wbc   Chest radiograph with cardiomegaly with no infiltrates. Pacemaker defibrillator in place with one atrial lead and biventricular leads.   EKG 95 bpm, right axis deviation, qtc 501, atrial sensed and ventricular paced rhythm, with no significant ST segment or T wave changes.   Patient was placed on IV antibiotic therapy.  04/08 worsening renal function, started on IV fluids. 04/09 renal function and metabolic acidosis have improved. Stop SGLT 2 inh. Continue oral antibiotic therapy for 3 more days.

## 2022-09-16 NOTE — Assessment & Plan Note (Signed)
No clinical signs of exacerbation, plan to continue medical therapy with carvedilol, spironolactone, entresto and bidil. Continue to hold on loop diuretic for now.  Clinically patient is euvolemic.

## 2022-09-16 NOTE — Assessment & Plan Note (Signed)
Renal function with serum cr at 1,73 with K at 3,9 and serum bicarbonate at 16.  Positive anion gap metabolic acidosis, positive ketones on urine analysis.  Glucose is 99.   Plan to continue supportive care and follow up renal function in am, possible starvation ketosis.

## 2022-09-16 NOTE — Evaluation (Signed)
Physical Therapy Evaluation Patient Details Name: [name] [name] MRN: [id number] DOB: [birth year] Today's Date: 09/16/2022  History of Present Illness  67 y.o. male admitted on 09/15/22 under observation status for weakness, fever, dx with Acute cystitis and generalized weakness.  Pt with significant PMH ofCAD, CHF, DM, HTN, NSTEMI, obesity, BIV ICD insertion, multiple cardiac stent.  Clinical Impression  Pt is generally weak and deconditioned from acute illness.  He is mildly unsteady on his feet which I feel he can compensate for with the cane he owns.  PT will practice cane use and stairs prior to d/c if he is still here tomorrow.  PT will continue to follow acutely for safe mobility progression.     Recommendations for follow up therapy are one component of a multi-disciplinary discharge planning process, led by the attending physician.  Recommendations may be updated based on patient status, additional functional criteria and insurance authorization.  Follow Up Recommendations       Assistance Recommended at Discharge PRN  Patient can return home with the following  A little help with walking and/or transfers    Equipment Recommendations None recommended by PT  Recommendations for Other Services       Functional Status Assessment Patient has had a recent decline in their functional status and demonstrates the ability to make significant improvements in function in a reasonable and predictable amount of time.     Precautions / Restrictions Precautions Precautions: Fall      Mobility  Bed Mobility Overal bed mobility: Modified Independent             General bed mobility comments: extra time and rail needed to get to sitting EOB.    Transfers Overall transfer level: Needs assistance Equipment used: Rolling walker (2 wheels) Transfers: Sit to/from Stand Sit to Stand: Min guard           General transfer comment: Min guard assist for safety and steadying assist, pt  using bedrail for help    Ambulation/Gait Ambulation/Gait assistance: Min guard Gait Distance (Feet): 150 Feet Assistive device: 1 person hand held assist (and hallway rail) Gait Pattern/deviations: Step-through pattern, Staggering left, Staggering right Gait velocity: decreased Gait velocity interpretation: <1.8 ft/sec, indicate of risk for recurrent falls   General Gait Details: Pt with mildly staggering gait pattern that he adjusts for using hallway rail.  Stairs            Wheelchair Mobility    Modified Rankin (Stroke Patients Only)       Balance Overall balance assessment: Mild deficits observed, not formally tested                                           Pertinent Vitals/Pain Pain Assessment Pain Assessment: No/denies pain    Home Living Family/patient expects to be discharged to:: Private residence Living Arrangements: Alone Available Help at Discharge: Friend(s);Available PRN/intermittently Type of Home: House Home Access: Stairs to enter Entrance Stairs-Rails: Doctor, general practice of Steps: 6   Home Layout: One level Home Equipment: Cane - single point;Tub bench      Prior Function Prior Level of Function : Independent/Modified Independent;Driving             Mobility Comments: Pt used SPC since his ICD placement not quite 2 weeks ago.       Hand Dominance   Dominant Hand: Right    Extremity/Trunk  Assessment   Upper Extremity Assessment Upper Extremity Assessment: Generalized weakness (pt is 4/5 throughout)    Lower Extremity Assessment Lower Extremity Assessment: Generalized weakness (seated MMT 4/5 throughout)    Cervical / Trunk Assessment Cervical / Trunk Assessment: Normal  Communication   Communication: No difficulties  Cognition Arousal/Alertness: Awake/alert Behavior During Therapy: WFL for tasks assessed/performed Overall Cognitive Status: Within Functional Limits for tasks assessed                                           General Comments      Exercises     Assessment/Plan    PT Assessment Patient needs continued PT services  PT Problem List Decreased strength;Decreased activity tolerance;Decreased balance;Decreased mobility;Decreased knowledge of use of DME       PT Treatment Interventions DME instruction;Gait training;Stair training;Functional mobility training;Therapeutic activities;Therapeutic exercise;Balance training;Patient/family education    PT Goals (Current goals can be found in the Care Plan section)  Acute Rehab PT Goals Patient Stated Goal: to get to where he can go back to the [facility] and work out PT Goal Formulation: With patient Time For Goal Achievement: 09/30/22 Potential to Achieve Goals: Good    Frequency Min 3X/week     Co-evaluation               AM-PAC PT "6 Clicks" Mobility  Outcome Measure Help needed turning from your back to your side while in a flat bed without using bedrails?: None Help needed moving from lying on your back to sitting on the side of a flat bed without using bedrails?: A Little Help needed moving to and from a bed to a chair (including a wheelchair)?: A Little Help needed standing up from a chair using your arms (e.g., wheelchair or bedside chair)?: A Little Help needed to walk in hospital room?: A Little Help needed climbing 3-5 steps with a railing? : A Little 6 Click Score: 19    End of Session Equipment Utilized During Treatment: Gait belt Activity Tolerance: Patient limited by fatigue Patient left: in chair;with call bell/phone within reach   PT Visit Diagnosis: Muscle weakness (generalized) (M62.81);Difficulty in walking, not elsewhere classified (R26.2);Unsteadiness on feet (R26.81)    Time: 0388-8280 PT Time Calculation (min) (ACUTE ONLY): 18 min   Charges:   PT Evaluation $PT Eval Moderate Complexity: 1 Mod        Corinna Capra, PT, DPT  Acute Rehabilitation Secure  chat is best for contact #[phone] office

## 2022-09-16 NOTE — Assessment & Plan Note (Addendum)
Continue blood pressure control with entresto, bidil and carvedilol.

## 2022-09-16 NOTE — Assessment & Plan Note (Signed)
Continue statin therapy.

## 2022-09-17 DIAGNOSIS — N179 Acute kidney failure, unspecified: Secondary | ICD-10-CM | POA: Diagnosis not present

## 2022-09-17 DIAGNOSIS — N3000 Acute cystitis without hematuria: Secondary | ICD-10-CM | POA: Diagnosis not present

## 2022-09-17 DIAGNOSIS — I5042 Chronic combined systolic (congestive) and diastolic (congestive) heart failure: Secondary | ICD-10-CM | POA: Diagnosis not present

## 2022-09-17 DIAGNOSIS — N189 Chronic kidney disease, unspecified: Secondary | ICD-10-CM

## 2022-09-17 DIAGNOSIS — I4729 Other ventricular tachycardia: Secondary | ICD-10-CM | POA: Diagnosis not present

## 2022-09-17 LAB — CBC
HCT: 47.4 % (ref 39.0–52.0)
Hemoglobin: 16.2 g/dL (ref 13.0–17.0)
MCH: 29.5 pg (ref 26.0–34.0)
MCHC: 34.2 g/dL (ref 30.0–36.0)
MCV: 86.3 fL (ref 80.0–100.0)
Platelets: 168 10*3/uL (ref 150–400)
RBC: 5.49 MIL/uL (ref 4.22–5.81)
RDW: 15.9 % — ABNORMAL HIGH (ref 11.5–15.5)
WBC: 9.1 10*3/uL (ref 4.0–10.5)
nRBC: 0 % (ref 0.0–0.2)

## 2022-09-17 LAB — BASIC METABOLIC PANEL
Anion gap: 10 (ref 5–15)
BUN: 31 mg/dL — ABNORMAL HIGH (ref 8–23)
CO2: 20 mmol/L — ABNORMAL LOW (ref 22–32)
Calcium: 9.4 mg/dL (ref 8.9–10.3)
Chloride: 105 mmol/L (ref 98–111)
Creatinine, Ser: 2.28 mg/dL — ABNORMAL HIGH (ref 0.61–1.24)
GFR, Estimated: 31 mL/min — ABNORMAL LOW (ref >60)
Glucose, Bld: 115 mg/dL — ABNORMAL HIGH (ref 70–99)
Potassium: 3.8 mmol/L (ref 3.5–5.1)
Sodium: 135 mmol/L (ref 135–145)

## 2022-09-17 LAB — GLUCOSE, CAPILLARY
Glucose-Capillary: 129 mg/dL — ABNORMAL HIGH (ref 70–99)
Glucose-Capillary: 107 mg/dL — ABNORMAL HIGH (ref 70–99)
Glucose-Capillary: 92 mg/dL (ref 70–99)
Glucose-Capillary: 94 mg/dL (ref 70–99)

## 2022-09-17 LAB — URINE CULTURE

## 2022-09-17 MED ORDER — CEPHALEXIN 250 MG PO CAPS
250.0000 mg | ORAL_CAPSULE | Freq: Three times a day (TID) | ORAL | Status: DC
  Administered 2022-09-17 – 2022-09-18 (×3): 250 mg via ORAL
  Filled 2022-09-17 (×3): qty 1

## 2022-09-17 MED ORDER — LACTATED RINGERS IV BOLUS
500.0000 mL | Freq: Once | INTRAVENOUS | Status: AC
  Administered 2022-09-17: 500 mL via INTRAVENOUS

## 2022-09-17 MED ORDER — SODIUM CHLORIDE 0.9 % IV BOLUS: 500.0000 mL | Freq: Once | INTRAVENOUS | Status: DC

## 2022-09-17 MED ORDER — LACTATED RINGERS IV SOLN: INTRAVENOUS | Status: AC

## 2022-09-17 MED ORDER — HYDROCORTISONE (PERIANAL) 2.5 % EX CREA
TOPICAL_CREAM | Freq: Three times a day (TID) | CUTANEOUS | Status: DC
  Administered 2022-09-17: 1 via RECTAL
  Filled 2022-09-17: qty 28.35

## 2022-09-17 NOTE — TOC Progression Note (Signed)
Transition of Care [facility]) - Progression Note    Patient Details  Name: [name] [name] MRN: [id number] Date of Birth: [birth year]  [facility]) CM/SW Contact  Leone Haven, RN Phone Number: 09/17/2022, 3:59 PM  Clinical Narrative:    from home alone, acute cystitis, iv abx changed to po abx, ivf's.  TOC following.        Expected Discharge Plan and Services                                               Social Determinants of Health (SDOH) Interventions SDOH Screenings   Food Insecurity: No Food Insecurity (09/15/2022)  Housing: Low Risk  (09/15/2022)  Transportation Needs: No Transportation Needs (09/15/2022)  Utilities: Not At Risk (09/15/2022)  Alcohol Screen: Low Risk  (08/15/2022)  Depression (PHQ2-9): Low Risk  (08/21/2022)  Financial Resource Strain: Low Risk  (09/05/2022)  Physical Activity: Inactive (09/20/2021)  Social Connections: Socially Isolated (09/20/2021)  Stress: No Stress Concern Present (09/20/2021)  Tobacco Use: Medium Risk (09/15/2022)    Readmission Risk Interventions     No data to display

## 2022-09-17 NOTE — Progress Notes (Signed)
Progress Note   Patient: [name] [name] UKG:[id number] DOB: [birth year] DOA: 09/15/2022     1 DOS: the patient was seen and examined on 09/17/2022   Brief hospital course: Mr. Nivar was admitted to the hospital with the working diagnosis of acute cystitis/ urinary tract infection, AKI.   67 yo male with the past medical history of heart failure, recent ICD implantation 08/2022, T2DM, hypertension, paroxysmal atrial fibrillation, CKD and dyslipidemia who presented with generalized weakness. Reported 2 days of generalized weakness, somnolence, lethargy, and dark urine, associated subjective fevers and chills. No abdominal pian, no dysuria or increased urinary frequency. On his initial physical examination his blood pressure was 184/113, HR 88, RR 21 and temp 100.4, 02 saturation 94%, dry mucous membranes, heart with S1 and S2 present and rhythmic with no gallops, rubs or murmurs, abdomen with no distention and no lower extremity edema.   Na 138, K 3.9 Cl 107 bicarbonate 15 glucose 91 bun 13 cr 1,67  Anion gap 16. Mg 2,0  High sensitive troponin 51 and 61 Wbc 9,1 hgb 17,8 plt 185  Sars covid 19 negative   Urine analysis SG 1,022, >300 protein, positive ketones, > 50 wbc   Chest radiograph with cardiomegaly with no infiltrates. Pacemaker defibrillator in place with one atrial lead and biventricular leads.   EKG 95 bpm, right axis deviation, qtc 501, atrial sensed and ventricular paced rhythm, with no significant ST segment or T wave changes.   Patient was placed on IV antibiotic therapy.  04/08 worsening renal function, started on IV fluids.    Assessment and Plan: * Acute cystitis UTI At home patient had no dysuria but dark color urine.  Urine culture result is pending.  Plan to continue antibiotic, will transition to cephalexin po. Follow up on cultures, cell count and temperature curve.   Paroxysmal ventricular tachycardia Continue mexiletine and telemetry monitoring.   Chronic  combined systolic and diastolic heart failure Echocardiogram with reduced LV systolic function, EF 30 to 35%. LV with positive wall motion abnormalities, mid to distal lateral wall, posterior wall, mid antero lateral segment, and basal inferior segment akinetic, the mid and distal inferior wall, basal antero lateral segment, and basal inferoseptal segment are hypokinetic, entire anterior wall, entire anterior septum, mid infero septal segment and apex normal.  RV with normal systolic function, RVSP 40,3 mmHg. No pericardial effusion, no significant valvular disease.   Patient with signs of hypovolemia.   Continue to hold on loop and spironolactone diuretic therapy. Continue with Entresto, bidil and carvedilol.   Acute kidney injury superimposed on chronic kidney disease CKD stage 3b, starvation ketosis.  Patient with worsening renal function with serum cr at 2,28 with K at 3,8 and serum bicarbonate at 20. Na 135 and Mg 1,9. Anion gap 10.    Suspected pre renal renal failure.   Plan to continue holding loop diuretic, hold spironolactone and add IV fluids with 500 cc bolus and then 50 ml per hr with balanced electrolytes solutions to prevent Cl load.   Essential hypertension Continue blood pressure control with entresto, bidil and carvedilol.  Hold diuretic therapy.   Hyperlipidemia Continue statin therapy.   Depression Patient not taking paroxetine at home.         Subjective: Patient is feeling better, urine continue to be dark color, he reports not being at his baseline yet.   Physical Exam: Vitals:   09/16/22 1944 09/17/22 0024 09/17/22 0439 09/17/22 0720  BP: 104/69 113/75 108/79 102/68  Pulse: 70 70 68  67  Resp: (!) 21 18 15 17   Temp: 99 F (37.2 C) 98.3 F (36.8 C) 98 F (36.7 C) 98.1 F (36.7 C)  TempSrc: Oral Oral Oral Oral  SpO2: 94% 94% 94% 95%  Weight:   133.7 kg   Height:       Neurology awake and alert ENT with mild pallor Cardiovascular with S1 and  S2 present and rhythmic with no gallops, rubs or murmurs No JVD No lower extremity edema Respiratory with no rales or wheezing No abdominal distention  Data Reviewed:    Family Communication: no family at the bedside   Disposition: Status is: Inpatient Remains inpatient appropriate because: renal failure   Planned Discharge Destination: Home     Author: Coralie Keens, MD 09/17/2022 9:45 AM  For on call review [email or web address].

## 2022-09-17 NOTE — Plan of Care (Signed)

## 2022-09-17 NOTE — Assessment & Plan Note (Signed)
Patient not taking paroxetine at home.

## 2022-09-17 NOTE — Progress Notes (Signed)
Physical Therapy Treatment Patient Details Name: [name] [name] MRN: [id number] DOB: 03/17/1956 Today's Date: 09/17/2022   History of Present Illness 67 y.o. male admitted on 09/15/22 under observation status for weakness, fever, dx with Acute cystitis and generalized weakness.  Pt with significant PMH ofCAD, CHF, DM, HTN, NSTEMI, obesity, BIV ICD insertion, multiple cardiac stent.    PT Comments    Pt tolerated treatment well today. Pt was able to ambulate in hallway and navigate stairs with SPC at Mod I level. Pt has met all acute PT goals and is DC from PT. Pt has post acute PT needs. Pt educated that [facility] would be best options for mobility at current level. Re consult PT if mobility status changes.    Recommendations for follow up therapy are one component of a multi-disciplinary discharge planning process, led by the attending physician.  Recommendations may be updated based on patient status, additional functional criteria and insurance authorization.  Follow Up Recommendations       Assistance Recommended at Discharge PRN  Patient can return home with the following A little help with walking and/or transfers   Equipment Recommendations  None recommended by PT    Recommendations for Other Services       Precautions / Restrictions Precautions Precautions: Fall Restrictions Weight Bearing Restrictions: No     Mobility  Bed Mobility               General bed mobility comments: Pt up in chair    Transfers Overall transfer level: Independent Equipment used: None Transfers: Sit to/from Stand                  Ambulation/Gait Ambulation/Gait assistance: Modified independent (Device/Increase time) Gait Distance (Feet): 1000 Feet Assistive device: Straight cane Gait Pattern/deviations: Step-through pattern, Decreased stride length, Wide base of support Gait velocity: decreased     General Gait Details: Pt much more steady with SPC for gait. no LOB  noted   Stairs Stairs: Yes Stairs assistance: Supervision Stair Management: One rail Left, With cane, Step to pattern, Forwards Number of Stairs: 15 General stair comments: Cues for sequencing   Wheelchair Mobility    Modified Rankin (Stroke Patients Only)       Balance Overall balance assessment: No apparent balance deficits (not formally assessed)                                          Cognition Arousal/Alertness: Awake/alert Behavior During Therapy: WFL for tasks assessed/performed Overall Cognitive Status: Within Functional Limits for tasks assessed                                          Exercises      General Comments General comments (skin integrity, edema, etc.): VSS on RA      Pertinent Vitals/Pain Pain Assessment Pain Assessment: No/denies pain    Home Living                          Prior Function            PT Goals (current goals can now be found in the care plan section) Progress towards PT goals: Goals met/education completed, patient discharged from PT    Frequency    Other (Comment) (  DCPT)      PT Plan Frequency needs to be updated (DCPT)    Co-evaluation              AM-PAC PT "6 Clicks" Mobility   Outcome Measure  Help needed turning from your back to your side while in a flat bed without using bedrails?: None Help needed moving from lying on your back to sitting on the side of a flat bed without using bedrails?: None Help needed moving to and from a bed to a chair (including a wheelchair)?: None Help needed standing up from a chair using your arms (e.g., wheelchair or bedside chair)?: None Help needed to walk in hospital room?: None Help needed climbing 3-5 steps with a railing? : A Little 6 Click Score: 23    End of Session Equipment Utilized During Treatment: Gait belt Activity Tolerance: Patient tolerated treatment well Patient left: in chair;with call bell/phone  within reach Nurse Communication: Mobility status PT Visit Diagnosis: Muscle weakness (generalized) (M62.81);Difficulty in walking, not elsewhere classified (R26.2);Unsteadiness on feet (R26.81)     Time: 5885-0277 PT Time Calculation (min) (ACUTE ONLY): 24 min  Charges:  $Gait Training: 23-37 mins                     Shela Nevin, PT, DPT Acute Rehab Services [phone]    Gladys Damme 09/17/2022, 4:03 PM

## 2022-09-18 ENCOUNTER — Telehealth (HOSPITAL_COMMUNITY): Payer: Self-pay | Admitting: Emergency Medicine

## 2022-09-18 ENCOUNTER — Other Ambulatory Visit: Payer: Self-pay | Admitting: Cardiovascular Disease

## 2022-09-18 ENCOUNTER — Other Ambulatory Visit (HOSPITAL_COMMUNITY): Payer: Self-pay

## 2022-09-18 ENCOUNTER — Telehealth: Payer: Self-pay | Admitting: *Deleted

## 2022-09-18 DIAGNOSIS — N390 Urinary tract infection, site not specified: Secondary | ICD-10-CM

## 2022-09-18 DIAGNOSIS — G4733 Obstructive sleep apnea (adult) (pediatric): Secondary | ICD-10-CM

## 2022-09-18 DIAGNOSIS — I5042 Chronic combined systolic (congestive) and diastolic (congestive) heart failure: Secondary | ICD-10-CM

## 2022-09-18 DIAGNOSIS — N179 Acute kidney failure, unspecified: Secondary | ICD-10-CM | POA: Diagnosis not present

## 2022-09-18 DIAGNOSIS — I4729 Other ventricular tachycardia: Secondary | ICD-10-CM | POA: Diagnosis not present

## 2022-09-18 LAB — RENAL FUNCTION PANEL
Albumin: 3.4 g/dL — ABNORMAL LOW (ref 3.5–5.0)
Anion gap: 10 (ref 5–15)
BUN: 36 mg/dL — ABNORMAL HIGH (ref 8–23)
CO2: 20 mmol/L — ABNORMAL LOW (ref 22–32)
Calcium: 9.6 mg/dL (ref 8.9–10.3)
Chloride: 107 mmol/L (ref 98–111)
Creatinine, Ser: 2.05 mg/dL — ABNORMAL HIGH (ref 0.61–1.24)
GFR, Estimated: 35 mL/min — ABNORMAL LOW (ref >60)
Glucose, Bld: 91 mg/dL (ref 70–99)
Phosphorus: 3.4 mg/dL (ref 2.5–4.6)
Potassium: 4.4 mmol/L (ref 3.5–5.1)
Sodium: 137 mmol/L (ref 135–145)

## 2022-09-18 LAB — URINE CULTURE: Culture: >=100000 — AB

## 2022-09-18 LAB — GLUCOSE, CAPILLARY: Glucose-Capillary: 88 mg/dL (ref 70–99)

## 2022-09-18 LAB — CULTURE, BLOOD (ROUTINE X 2): Culture: NO GROWTH

## 2022-09-18 MED ORDER — HYDROCORTISONE (PERIANAL) 2.5 % EX CREA: TOPICAL_CREAM | Freq: Three times a day (TID) | CUTANEOUS | 0 refills | Status: DC

## 2022-09-18 MED ORDER — CEPHALEXIN 250 MG PO CAPS
250.0000 mg | ORAL_CAPSULE | Freq: Three times a day (TID) | ORAL | 0 refills | Status: AC
  Filled 2022-09-18: qty 9, 3d supply, fill #0

## 2022-09-18 NOTE — Telephone Encounter (Signed)
-----   Message from Lennette Bihari, MD sent at 09/15/2022  9:38 AM EDT ----- Burna Mortimer. Please notify pt of results; try for in-lab CPAP/BiPAP titration; if unable then Auto-PAP as prescribed.

## 2022-09-18 NOTE — Telephone Encounter (Signed)
Called [name] [name] to reschedule initial paramedicine appointment.  Pt was discharged today from a 3 day hospital stay.  No answer on phone call.  LVM for him to call me.  I will reach out tomorrow to reschedule.    Beatrix Shipper, EMT-Paramedic [phone] 09/18/2022

## 2022-09-18 NOTE — TOC Transition Note (Signed)
Transition of Care [facility]) - CM/SW Discharge Note   Patient Details  Name: [name] [name] MRN: [id number] Date of Birth: [birth year]  [facility] CM/SW Contact:  Leone Haven, RN Phone Number: 09/18/2022, 11:24 AM   Clinical Narrative:    Patient is for dc today, TOC to fill one medication.  The other medication was sent to [facility].   Final next level of care: Home/Self Care     Patient Goals and CMS Choice      Discharge Placement                         Discharge Plan and Services Additional resources added to the After Visit Summary for                                       Social Determinants of Health (SDOH) Interventions SDOH Screenings   Food Insecurity: No Food Insecurity (09/15/2022)  Housing: Low Risk  (09/15/2022)  Transportation Needs: No Transportation Needs (09/15/2022)  Utilities: Not At Risk (09/15/2022)  Alcohol Screen: Low Risk  (08/15/2022)  Depression (PHQ2-9): Low Risk  (08/21/2022)  Financial Resource Strain: Low Risk  (09/05/2022)  Physical Activity: Inactive (09/20/2021)  Social Connections: Socially Isolated (09/20/2021)  Stress: No Stress Concern Present (09/20/2021)  Tobacco Use: Medium Risk (09/15/2022)     Readmission Risk Interventions     No data to display

## 2022-09-18 NOTE — Telephone Encounter (Signed)
Message left to return a call to discuss sleep study results and recommendations. 

## 2022-09-18 NOTE — Assessment & Plan Note (Signed)
(  present on admission).  At home patient had no dysuria but dark color urine.  Urine cultures positive for E coli >100,000 CFU Patient was initially treated with IV ceftriaxone and then transitioned to oral cephalexin.   Plan to continue 3 more days of therapy.

## 2022-09-18 NOTE — Consult Note (Signed)
   [facility] CM Inpatient Consult   09/18/2022  [name] [name] 31-Oct-1955 [id number]  [facility] [[facility]]  Accountable Care Organization [ACO] Patient: [name] Medicare  Primary Care Provider:  Corwin Levins, MD with [place] [facility] which is listed to provide the Transition of Care follow up  Patient screened for less than 30 days readmission hospitalization with noted medium risk score for unplanned readmission risk and to assess for ongoing [facility]  [[facility]] Care Management service needs for post hospital transition for care coordination.  Review of patient's electronic medical record reveals patient is admitted with cystitis Hx of HF CAD and pacemaker.  Met with patient regarding ongoing follow up and he endorses ongoing needs, phone number confirmed but states, "I left my charger at home and it's out right now." Patient confirms his friend Milus Banister is the contact as well. Patient was given an appointment reminder card and 24 hour nurse line magnet.  Patient states he is aware of transportation with [facility] if needed. No new needs verbalized at this time.  Plan:  Referral request for community care coordination: Request for [employer] [facility] RN CC for ongoing post hospital follow up outreach again he agrees to.  Of note, [facility] Care Management/Population Health does not replace or interfere with any arrangements made by the Inpatient Transition of Care team.  For questions contact:   Charlesetta Shanks, RN BSN CCM [facility]  [phone] business mobile phone Toll free office [phone]  *Concierge Line  [phone] Fax number: [phone] [email or web address]

## 2022-09-18 NOTE — Discharge Summary (Addendum)
Physician Discharge Summary   Patient: [name] [name] MRN: [id number] DOB: [birth year]  Admit date:     09/15/2022  Discharge date: 09/18/22  Discharge Physician: [name] [name]   PCP: [name] Levins, MD   Recommendations at discharge:    Patient will continue cephalexin for 3 more days for E Coli urinary tract infection, present on admission. Discontinue SGLT 2 inh due to ketoacidosis and urine infection. Resume heart failure regimen with carvedilol, entresto, bidil and spironolactone.  Follow up with [name] [name] in 7 to 10 days. Follow up with Cardiology as scheduled.   Discharge Diagnoses: Principal Problem:   UTI (urinary tract infection) Active Problems:   Chronic combined systolic and diastolic heart failure   Paroxysmal ventricular tachycardia   Acute kidney injury superimposed on chronic kidney disease   Essential hypertension   Hyperlipidemia   Depression  Resolved Problems:   * No resolved hospital problems. [facility] Course: [name] [name] was admitted to the hospital with the working diagnosis of acute cystitis/ urinary tract infection, AKI.   67 yo male with the past medical history of heart failure, recent ICD implantation 08/2022, T2DM, hypertension, paroxysmal atrial fibrillation, CKD and dyslipidemia who presented with generalized weakness. Reported 2 days of generalized weakness, somnolence, lethargy, and dark urine, associated subjective fevers and chills. No abdominal pian, no dysuria or increased urinary frequency. On his initial physical examination his blood pressure was 184/113, HR 88, RR 21 and temp 100.4, 02 saturation 94%, dry mucous membranes, heart with S1 and S2 present and rhythmic with no gallops, rubs or murmurs, abdomen with no distention and no lower extremity edema.   Na 138, K 3.9 Cl 107 bicarbonate 15 glucose 91 bun 13 cr 1,67  Anion gap 16. Mg 2,0  High sensitive troponin 51 and 61 Wbc 9,1 hgb 17,8 plt 185  Sars covid 19 negative   Urine  analysis SG 1,022, >300 protein, positive ketones, > 50 wbc   Chest radiograph with cardiomegaly with no infiltrates. Pacemaker defibrillator in place with one atrial lead and biventricular leads.   EKG 95 bpm, right axis deviation, qtc 501, atrial sensed and ventricular paced rhythm, with no significant ST segment or T wave changes.   Patient was placed on IV antibiotic therapy.  04/08 worsening renal function, started on IV fluids. 04/09 renal function and metabolic acidosis have improved. Stop SGLT 2 inh. Continue oral antibiotic therapy for 3 more days.     Assessment and Plan: * UTI (urinary tract infection) (present on admission).  At home patient had no dysuria but dark color urine.  Urine cultures positive for E coli >100,000 CFU Patient was initially treated with IV ceftriaxone and then transitioned to oral cephalexin.   Plan to continue 3 more days of therapy.  Paroxysmal ventricular tachycardia Continue mexiletine with good toleration.   Chronic combined systolic and diastolic heart failure Echocardiogram with reduced LV systolic function, EF 30 to 35%. LV with positive wall motion abnormalities, mid to distal lateral wall, posterior wall, mid antero lateral segment, and basal inferior segment akinetic, the mid and distal inferior wall, basal antero lateral segment, and basal inferoseptal segment are hypokinetic, entire anterior wall, entire anterior septum, mid infero septal segment and apex normal.  RV with normal systolic function, RVSP 40,3 mmHg. No pericardial effusion, no significant valvular disease.   Diuretic therapy was held and patient received IV fluids for  hypovolemia with good toleration.   At the time of his discharge will continue with heart failure  regimen with spironolactone, Entresto, bidil and carvedilol.   Acute kidney injury superimposed on chronic kidney disease CKD stage 3b, ketoacidosis.   Patient responded well to IV fluids with balance  electrolyte solution, lactated ringers. At the time of his discharge his renal function has a serum cr of 2,0 with K at 4,4 and serum bicarbonate at 20, with P at 3,4 and Mg 1.9.  Patient will resume diuretic therapy at home.  Due to ketoacidosis and urine infection will discontinue SGLT 2 inh.   Suspected pre renal renal failure.   Plan to continue holding loop diuretic, hold spironolactone and add IV fluids with 500 cc bolus and then 50 ml per hr with balanced electrolytes solutions to prevent Cl load.   Essential hypertension Continue blood pressure control with entresto, bidil and carvedilol.   Hyperlipidemia Continue statin therapy.   Depression Patient not taking paroxetine at home.          Consultants: none  Procedures performed: none   Disposition: Home Diet recommendation:  Discharge Diet Orders (From admission, onward)     Start     Ordered   09/18/22 0000  Diet - low sodium heart healthy        09/18/22 1115           Cardiac and Carb modified diet DISCHARGE MEDICATION: Allergies as of 09/18/2022       Reactions   Penicillin G Other (See Comments)   Did it involve swelling of the face/tongue/throat, SOB, or low BP?  N/A Did it involve sudden or severe rash/hives, skin peeling, or any reaction on the inside of your mouth or nose? N/A Did you need to seek medical attention at a hospital or doctor's office? N/A When did it last happen? Child If all above answers are "NO", may proceed with cephalosporin use.   Penicillins Other (See Comments)   Did it involve swelling of the face/tongue/throat, SOB, or low BP? N/A Did it involve sudden or severe rash/hives, skin peeling, or any reaction on the inside of your mouth or nose?N/A Did you need to seek medical attention at a hospital or doctor's office? N/A When did it last happen? Child      If all above answers are "NO", may proceed with cephalosporin use.        Medication List     STOP taking these  medications    empagliflozin 25 MG Tabs tablet Commonly known as: Jardiance       TAKE these medications    acetaminophen 500 MG tablet Commonly known as: TYLENOL Take 1,000 mg by mouth every 6 (six) hours as needed for moderate pain.   albuterol 108 (90 Base) MCG/ACT inhaler Commonly known as: VENTOLIN HFA Inhale 2 puffs into the lungs every 6 (six) hours as needed for wheezing or shortness of breath.   amiodarone 200 MG tablet Commonly known as: PACERONE Take 1 tablet (200 mg total) by mouth daily.   aspirin EC 81 MG tablet Take 1 tablet (81 mg total) by mouth daily. Swallow whole.   atorvastatin 80 MG tablet Commonly known as: LIPITOR Take 1 tablet (80 mg total) by mouth every evening.   B-D SINGLE USE SWABS BUTTERFLY Pads Use up to 4 times a day to test blood sugars. Dx: E10.9, E11.9   carvedilol 6.25 MG tablet Commonly known as: COREG Take 1 tablet (6.25 mg total) by mouth 2 (two) times daily with a meal.   cephALEXin 250 MG capsule Commonly known as: KEFLEX Take  1 capsule (250 mg total) by mouth every 8 (eight) hours for 3 days.   clopidogrel 75 MG tablet Commonly known as: PLAVIX Take 1 tablet (75 mg total) by mouth daily.   dorzolamide-timolol 2-0.5 % ophthalmic solution Commonly known as: COSOPT Place 1 drop into both eyes See admin instructions. Instill one drop into both eyes twice in the morning and once at night   Entresto 97-103 MG Generic drug: sacubitril-valsartan Take 1 tablet by mouth 2 (two) times daily.   ezetimibe 10 MG tablet Commonly known as: ZETIA Take 1 tablet (10 mg total) by mouth daily.   gabapentin 300 MG capsule Commonly known as: NEURONTIN Take 1 capsule (300 mg total) by mouth 3 (three) times daily as needed. What changed: when to take this   hydrocortisone 2.5 % rectal cream Commonly known as: ANUSOL-HC Place rectally 3 (three) times daily.   isosorbide-hydrALAZINE 20-37.5 MG tablet Commonly known as: BIDIL Take 1  tablet by mouth 3 (three) times daily.   Lancets Misc Use as directed four times per day E11.9   mexiletine 250 MG capsule Commonly known as: MEXITIL Take 1 capsule (250 mg total) by mouth every 12 (twelve) hours.   nitroGLYCERIN 0.4 MG SL tablet Commonly known as: NITROSTAT Place 1 tablet (0.4 mg total) under the tongue every 5 (five) minutes as needed for chest pain. N   OneTouch Verio [employer] 1 Device by Does not apply route in the morning, at noon, in the evening, and at bedtime. E11.9   OneTouch Verio test strip Generic drug: glucose blood Use as instructed four times per day E11.9   pantoprazole 40 MG tablet Commonly known as: PROTONIX Take 1 tablet (40 mg total) by mouth daily.   PARoxetine 20 MG tablet Commonly known as: PAXIL TAKE 1 TABLET(20 MG) BY MOUTH DAILY What changed:  how much to take how to take this when to take this additional instructions   spironolactone 25 MG tablet Commonly known as: ALDACTONE Take 1 tablet (25 mg total) by mouth daily.   tirzepatide 2.5 MG/0.5ML Pen Commonly known as: MOUNJARO Inject 2.5 mg into the skin once a week. E11.9   True Metrix Level 1 Low Soln Use as needed. Dx: E10.9, E11.9        Follow-up Information     [name] Levins, MD. Go on 09/28/2022.   Specialties: Internal Medicine, Radiology Why: @9 :00am Contact information: [address] [place] [address] [phone]                Discharge Exam: Ceasar Mons Weights   09/16/22 0339 09/17/22 0439 09/18/22 0149  Weight: 133.2 kg 133.7 kg 133.6 kg   BP (!) 110/56 (BP Location: Right Arm)   Pulse 68   Temp 98.1 F (36.7 C) (Oral)   Resp 18   Ht 6\' 1"  (1.854 m)   Wt 133.6 kg   SpO2 98%   BMI 38.86 kg/m   Patient is feeling well, no chest pain or dyspnea  Neurology awake and alert ENT with no pallor Cardiovascular with S1 and S2 present and rhythmic with no gallops, rubs or murmurs Respiratory with no rales or wheezing Abdomen  with no distention  No lower extremity edema   Condition at discharge: stable  The results of significant diagnostics from this hospitalization (including imaging, microbiology, ancillary and laboratory) are listed below for reference.   Imaging Studies: DG Chest Port 1 View  Result Date: 09/15/2022 CLINICAL DATA:  Questionable sepsis EXAM: PORTABLE CHEST  1 VIEW COMPARISON:  Chest x-ray 08/15/2022 FINDINGS: The heart is enlarged. ICD is again seen. The lungs are clear. There is no pleural effusion or pneumothorax. No acute fractures are seen. IMPRESSION: Cardiomegaly. No acute pulmonary process. Electronically Signed   By: Darliss CheneyAmy  Guttmann M.D.   On: 09/15/2022 19:36   SLEEP STUDY DOCUMENTS  Result Date: 09/05/2022 Ordered by an unspecified provider.  CUP PACEART INCLINIC DEVICE CHECK  Result Date: 08/29/2022 Wound check appointment/ CRT-D device.  Steri-strips removed. Wound without redness or edema. Incision edges approximated, wound well healed. Normal device function. BI-ventricularly pacing 98% Thresholds, sensing, and impedances consistent with implant measurements. Device programmed with cap confirm on as set at implant until 3 month visit. Histogram distribution appropriate for patient and level of activity. No mode switches or ventricular arrhythmias noted. Patient educated about wound care, arm mobility, lifting restrictions, shock plan. ROV in 3 months with implanting physician.Syliva OvermanMandy Wagoner, RN   Microbiology: Results for orders placed or performed during the hospital encounter of 09/15/22  Urine Culture     Status: Abnormal   Collection Time: 09/15/22  6:47 PM   Specimen: Urine, Random  Result Value Ref Range Status   Specimen Description URINE, RANDOM  Final   Special Requests   Final    URINE, CLEAN CATCH Performed at [facility], [address] [place], [place] [address]    Culture >=100,000 COLONIES/mL ESCHERICHIA COLI (A)  Final   Report Status 09/18/2022 FINAL   Final   Organism ID, Bacteria ESCHERICHIA COLI (A)  Final      Susceptibility   Escherichia coli - MIC*    AMPICILLIN <=2 SENSITIVE Sensitive     CEFAZOLIN <=4 SENSITIVE Sensitive     CEFEPIME <=0.12 SENSITIVE Sensitive     CEFTRIAXONE <=0.25 SENSITIVE Sensitive     CIPROFLOXACIN <=0.25 SENSITIVE Sensitive     GENTAMICIN <=1 SENSITIVE Sensitive     IMIPENEM <=0.25 SENSITIVE Sensitive     NITROFURANTOIN <=16 SENSITIVE Sensitive     TRIMETH/SULFA <=20 SENSITIVE Sensitive     AMPICILLIN/SULBACTAM <=2 SENSITIVE Sensitive     PIP/TAZO <=4 SENSITIVE Sensitive     * >=100,000 COLONIES/mL ESCHERICHIA COLI  SARS Coronavirus 2 by RT PCR (hospital order, performed in [facility] hospital lab) *cepheid single result test* Anterior Nasal Swab     Status: None   Collection Time: 09/15/22  6:48 PM   Specimen: Anterior Nasal Swab  Result Value Ref Range Status   SARS Coronavirus 2 by RT PCR NEGATIVE NEGATIVE Final    Comment: Performed at [facility], [address] [place], [place] [address]  Blood Culture (routine x 2)     Status: None (Preliminary result)   Collection Time: 09/15/22  6:51 PM   Specimen: BLOOD  Result Value Ref Range Status   Specimen Description BLOOD SITE NOT SPECIFIED  Final   Special Requests   Final    BOTTLES DRAWN AEROBIC AND ANAEROBIC Blood Culture adequate volume   Culture   Final    NO GROWTH 3 DAYS Performed at [facility], [address] [place], [place] [address]    Report Status PENDING  Incomplete  Blood Culture (routine x 2)     Status: None (Preliminary result)   Collection Time: 09/15/22  7:02 PM   Specimen: BLOOD RIGHT HAND  Result Value Ref Range Status   Specimen Description BLOOD RIGHT HAND  Final   Special Requests   Final    BOTTLES DRAWN  AEROBIC AND ANAEROBIC Blood Culture adequate volume   Culture   Final    NO GROWTH 3 DAYS Performed at [facility], [address] [place], [place] [address]    Report Status PENDING   Incomplete   *Note: Due to a large number of results and/or encounters for the requested time period, some results have not been displayed. A complete set of results can be found in Results Review.    Labs: CBC: Recent Labs  Lab 09/15/22 1851 09/16/22 0039 09/17/22 0054  WBC 9.1 10.8* 9.1  NEUTROABS 6.7 7.8*  --   HGB 17.8* 17.3* 16.2  HCT 53.5* 51.4 47.4  MCV 87.1 86.2 86.3  PLT 185 173 168   Basic Metabolic Panel: Recent Labs  Lab 09/15/22 1851 09/15/22 2109 09/16/22 0039 09/17/22 0054 09/18/22 0401  NA 138  --  138 135 137  K 3.9  --  3.9 3.8 4.4  CL 107  --  104 105 107  CO2 15*  --  16* 20* 20*  GLUCOSE 91  --  99 115* 91  BUN 13  --  16 31* 36*  CREATININE 1.67*  --  1.73* 2.28* 2.05*  CALCIUM 10.1  --  9.9 9.4 9.6  MG  --  2.0 1.9  --   --   PHOS  --   --   --   --  3.4   Liver Function Tests: Recent Labs  Lab 09/15/22 1851 09/16/22 0039 09/18/22 0401  AST 19 15  --   ALT 20 19  --   ALKPHOS 82 80  --   BILITOT 1.1 1.4*  --   PROT 7.8 7.6  --   ALBUMIN 4.1 3.8 3.4*   CBG: Recent Labs  Lab 09/17/22 0612 09/17/22 1131 09/17/22 1642 09/17/22 2058 09/18/22 0645  GLUCAP 129* 107* 92 94 88    Discharge time spent: greater than 30 minutes.  Signed: Coralie Keens, MD [facility] 09/18/2022

## 2022-09-18 NOTE — Plan of Care (Signed)
  Problem: Education: Goal: Knowledge of General Education information will improve Description: Including pain rating scale, medication(s)/side effects and non-pharmacologic comfort measures Outcome: Progressing   Problem: Activity: Goal: Risk for activity intolerance will decrease Outcome: Progressing   

## 2022-09-19 ENCOUNTER — Telehealth: Payer: Self-pay | Admitting: *Deleted

## 2022-09-19 ENCOUNTER — Telehealth: Payer: Self-pay

## 2022-09-19 LAB — CULTURE, BLOOD (ROUTINE X 2)

## 2022-09-19 NOTE — Transitions of Care (Post Inpatient/ED Visit) (Signed)
   09/19/2022  Name: [name] [name] MRN: [id number] DOB: 03/07/1956  Today's TOC FU Call Status: Today's TOC FU Call Status:: Successful TOC FU Call Competed  Transition Care Management Follow-up Telephone Call Date of Discharge: 09/18/22 Discharge Facility[facility]) Type of Discharge: Inpatient Admission Primary Inpatient Discharge Diagnosis:: Acute Cystitis How have you been since you were released from the hospital?: Better Any questions or concerns?: No  Items Reviewed: Did you receive and understand the discharge instructions provided?: Yes Medications obtained and verified?: Yes (Medications Reviewed) Any new allergies since your discharge?: No Dietary orders reviewed?: No Do you have support at home?: Yes People in Home: friend(s) Name of Support/Comfort Primary Source: [facility] and Equipment/Supplies: Were Home Health Services Ordered?: No Any new equipment or medical supplies ordered?: No  Functional Questionnaire: Do you need assistance with bathing/showering or dressing?: No Do you need assistance with meal preparation?: No Do you need assistance with eating?: No Do you have difficulty maintaining continence: No Do you need assistance with getting out of bed/getting out of a chair/moving?: No (Patient uses his cane since his ICD placement 2 weeks ago) Do you have difficulty managing or taking your medications?: No  Follow up appointments reviewed: PCP Follow-up appointment confirmed?: Yes Date of PCP follow-up appointment?: 09/28/22 Follow-up Provider: Dr. Jonny Ruiz Specialist [facility] Follow-up appointment confirmed?: Yes Date of Specialist follow-up appointment?: 10/01/22 Follow-Up Specialty Provider:: Dr. Gasper Lloyd Do you need transportation to your follow-up appointment?: No Do you understand care options if your condition(s) worsen?: Yes-patient verbalized understanding  SDOH Interventions Today    Flowsheet Row Most Recent Value  SDOH  Interventions   Food Insecurity Interventions Intervention Not Indicated  Housing Interventions Intervention Not Indicated  Transportation Interventions Intervention Not Indicated      Jodelle Gross, RN, BSN, CCM Care Management Coordinator [facility] Phone: [phone]/Fax: [phone]

## 2022-09-19 NOTE — Progress Notes (Signed)
  Care Coordination   Note   09/19/2022 Name: [name] [name] MRN: [id number] DOB: 11/24/1955  [name] is a 67 y.o. year old male who sees Corwin Levins, MD for primary care. I reached out to [facility] by phone today to offer care coordination services.  [name] was given information about Care Coordination services today including:   The Care Coordination services include support from the care team which includes your Nurse Coordinator, Clinical Social Worker, or Pharmacist.  The Care Coordination team is here to help remove barriers to the health concerns and goals most important to you. Care Coordination services are voluntary, and the patient may decline or stop services at any time by request to their care team member.   Care Coordination Consent Status: Patient agreed to services and verbal consent obtained.   Follow up plan:  Telephone appointment with care coordination team member scheduled for:  09/27/2022  Encounter Outcome:  Pt. Scheduled from referral   Burman Nieves, [facility] Care Coordination Care Guide Direct Dial: [phone]

## 2022-09-20 ENCOUNTER — Telehealth (HOSPITAL_COMMUNITY): Payer: Self-pay | Admitting: Emergency Medicine

## 2022-09-20 LAB — CULTURE, BLOOD (ROUTINE X 2)
Culture: NO GROWTH
Special Requests: ADEQUATE

## 2022-09-20 NOTE — Telephone Encounter (Signed)
Spoke with Mr. Westgate to reschedule initial home visit.  He requested to do visit next week.  Scheduled 09/25/22  @ 3:30   Will provide scale, pill box and pill cutter.    Beatrix Shipper, EMT-Paramedic [phone] 09/20/2022

## 2022-09-20 NOTE — Telephone Encounter (Signed)
Patient notified of sleep study results and recommendations. He agrees to proceed with CPAP titration. 

## 2022-09-25 ENCOUNTER — Other Ambulatory Visit (HOSPITAL_COMMUNITY): Payer: Self-pay | Admitting: Emergency Medicine

## 2022-09-25 NOTE — Progress Notes (Signed)
SOCIAL/MEDICAL BARRIERS:  PHARMACY USED [facility]  PCP Dr Loni Muse UHD, [employer] Medicare  MED ISSUES:  AFFORDABILITY NONE  PT ASSIST APPS NEEDED NONE  SOURCE OF INCOME Disability  TRANSPORTATION yes  FOOD INSECURITIES/NEEDS None FOOD STAMPS none  REVIEWED DIET/FLUID/SALT RESTRICTIONS YES  RENT/OWN HOME ISSUES Rent  SOCIAL SUPPORT Friends/Family  SAFETY/DOMESTIC ISSUES NONE  SUBSTANCE ABUSE NONE  DAILY WEIGHTS YES  EDUCATE ON DISEASE PROCESS/SYMPTOMS/PURPOSES OF MEDS YES  Paramedicine Encounter    Patient ID: [name] [name], male    DOB: 1956/04/23, 67 y.o.   MRN: [id number]   Complaints "shaky"  low sugar CBG 85  Assessment A&O x 4, skin W&D w/ good color.  Lung sounds clear throughout. No peripheral edema noted  Compliance with meds Initial visit- he say's he's compliant  Pill box filled x 1 week  Refills needed Entresto, Amidarone  Meds changes since last visit NONE    Social changes None   BP (!) 140/100 (BP Location: Left Arm, Patient Position: Sitting, Cuff Size: Normal)   Pulse 72   Resp 16   Wt 292 lb (132.5 kg)   SpO2 92%   BMI 38.52 kg/m  CBG 85 Weight yesterday-not taken Last visit weight-313lb on 3/27 [facility]   This is the initial visit with [name] [name].  We met out on the front porch of his apartment as he did not want me to come inside - "my house is a mess"   [name] [name] was pleasant and had no complaints today other than feeling shaky.  He says, "I think my sugar is low."  CBG was 85.  He states since he has started taking Mounjaro injections he hasn't had much of an appetite.  "I think I'm not eating enough."  He denies food insecurities.  Discussed nutrition, label reading and fluid intake and he seems to have a good grasp of the right kind of food choices.  He states tonight he will make a homemade stir fry with some fresh vegetables and states he doesn't even keep salt in the house.   Started pt in a pill box today and  reconciled same x 1 week.  Discovered he had Furosemide 40mg  prn for weight gain >3lbs.  prescribed by Dr. [name].  Will add same to med list. Called in refills for Entresto 97-103 and Amiodarone 200mg .  Will be ready for pick up on Thursday. Reviewed up coming appointments and he was familiar with same.  ACTION: Home visit completed  Bethanie Dicker [phone] 09/25/22  Patient Care Team: Corwin Levins, MD as PCP - General (Internal Medicine) Tonny Bollman, MD as PCP - Cardiology (Cardiology) Tonny Bollman, MD as Consulting Physician (Cardiology) Kennon Rounds as Physician Assistant (Cardiology) Quintella Reichert, MD as Consulting Physician (Sleep Medicine) Ellin Saba, [facility] (Inactive) as Pharmacist (Pharmacist)  Patient Active Problem List   Diagnosis Date Noted   UTI (urinary tract infection) 09/16/2022   Acute cystitis 09/15/2022   Generalized weakness 09/15/2022   High anion gap metabolic acidosis 09/15/2022   Paroxysmal ventricular tachycardia 08/11/2022   Rash 02/25/2022   Hypotension 02/16/2022   Near syncope 02/15/2022   Hearing loss of left ear due to cerumen impaction 12/15/2021   Asthma 09/17/2021   PVC's (premature ventricular contractions) 09/03/2021   SVT (supraventricular tachycardia) 09/03/2021   History of TIA (transient ischemic attack) 02/05/2021   History of stroke 01/2021   Aortic atherosclerosis 12/26/2020   Cough 12/26/2020   Vitamin D deficiency  04/03/2020   B12 deficiency 04/03/2020   Itching 11/22/2019   Chronic heart failure with preserved ejection fraction (HFpEF) 05/13/2019   Elevated troponin 05/13/2019   Morbid obesity with BMI of 40.0-44.9, adult 05/13/2019   Pre-ulcerative calluses 02/03/2019   Foot injury, left, initial encounter 11/26/2018   Chest pain 05/30/2017   Hypokalemia 05/30/2017   Allergic rhinitis 12/18/2016   Lumbar radiculitis 09/05/2016   Lumbar spinal stenosis 09/05/2016   Acute kidney injury  superimposed on chronic kidney disease 12/29/2015   Chest pain syndrome 12/15/2015   Acute renal failure superimposed on stage 3a chronic kidney disease 12/15/2015   Atypical chest pain 12/15/2015   Encounter for well adult exam with abnormal findings 06/27/2015   Chronic low back pain 06/27/2015   Left lumbar radiculopathy 04/13/2015   Bilateral plantar fasciitis 04/13/2015   Essential hypertension 03/09/2015   OSA (obstructive sleep apnea) 03/27/2014   Peripheral neuropathy (HCC) 12/29/2013   External hemorrhoid, thrombosed 12/03/2013   Sinus bradycardia 11/15/2013   Chronic combined systolic and diastolic heart failure 11/14/2013   Coronary artery disease involving native coronary artery of native heart without angina pectoris    Hyperlipidemia    DM2 (diabetes mellitus, type 2) 06/26/2013   Gait disorder 06/26/2013   Obesity, Class III, BMI 40-49.9 (morbid obesity) (HCC) 09/10/2012   Former smoker 09/02/2012   Ischemic cardiomyopathy- EF 50-55% June 2015    Non-ST elevation (NSTEMI) myocardial infarction 09/01/2012   HEMORRHOIDS 12/26/2009   Acute prostatitis 12/26/2009   GANGLION CYST, WRIST, RIGHT 11/28/2009   COLONIC POLYPS 10/07/2009   Depression 07/21/2009   DYSPNEA 03/24/2009   GASTROESOPHAGEAL REFLUX DISEASE 01/27/2007   PERCUTANEOUS TRANSLUMINAL CORONARY ANGIOPLASTY, HX OF 02/09/2006    Current Outpatient Medications:    acetaminophen (TYLENOL) 500 MG tablet, Take 1,000 mg by mouth every 6 (six) hours as needed for moderate pain., Disp: , Rfl:    albuterol (VENTOLIN HFA) 108 (90 Base) MCG/ACT inhaler, Inhale 2 puffs into the lungs every 6 (six) hours as needed for wheezing or shortness of breath., Disp: 8 g, Rfl: 11   Alcohol Swabs (B-D SINGLE USE SWABS BUTTERFLY) PADS, Use up to 4 times a day to test blood sugars. Dx: E10.9, E11.9, Disp: 100 each, Rfl: 11   amiodarone (PACERONE) 200 MG tablet, Take 1 tablet (200 mg total) by mouth daily., Disp: 90 tablet, Rfl: 3    aspirin EC 81 MG tablet, Take 1 tablet (81 mg total) by mouth daily. Swallow whole., Disp: 90 tablet, Rfl: 3   atorvastatin (LIPITOR) 80 MG tablet, Take 1 tablet (80 mg total) by mouth every evening., Disp: 90 tablet, Rfl: 3   Blood Glucose Calibration (TRUE METRIX LEVEL 1) Low SOLN, Use as needed. Dx: E10.9, E11.9, Disp: 1 each, Rfl: 0   Blood Glucose Monitoring Suppl (ONETOUCH VERIO FLEX SYSTEM) DEVI, 1 Device by Does not apply route in the morning, at noon, in the evening, and at bedtime. E11.9, Disp: 1 each, Rfl: 0   carvedilol (COREG) 6.25 MG tablet, Take 1 tablet (6.25 mg total) by mouth 2 (two) times daily with a meal., Disp: 180 tablet, Rfl: 3   clopidogrel (PLAVIX) 75 MG tablet, Take 1 tablet (75 mg total) by mouth daily., Disp: 90 tablet, Rfl: 3   dorzolamide-timolol (COSOPT) 2-0.5 % ophthalmic solution, Place 1 drop into both eyes See admin instructions. Instill one drop into both eyes twice in the morning and once at night, Disp: , Rfl:    ezetimibe (ZETIA) 10 MG tablet, Take 1  tablet (10 mg total) by mouth daily., Disp: 90 tablet, Rfl: 3   gabapentin (NEURONTIN) 300 MG capsule, Take 1 capsule (300 mg total) by mouth 3 (three) times daily as needed. (Patient taking differently: Take 300 mg by mouth daily.), Disp: 270 capsule, Rfl: 1   glucose blood (ONETOUCH VERIO) test strip, Use as instructed four times per day E11.9, Disp: 400 each, Rfl: 12   hydrocortisone (ANUSOL-HC) 2.5 % rectal cream, Place rectally 3 (three) times daily., Disp: 30 g, Rfl: 0   isosorbide-hydrALAZINE (BIDIL) 20-37.5 MG tablet, Take 1 tablet by mouth 3 (three) times daily., Disp: 90 tablet, Rfl: 3   Lancets MISC, Use as directed four times per day E11.9, Disp: 400 each, Rfl: 3   mexiletine (MEXITIL) 250 MG capsule, Take 1 capsule (250 mg total) by mouth every 12 (twelve) hours., Disp: 180 capsule, Rfl: 3   nitroGLYCERIN (NITROSTAT) 0.4 MG SL tablet, Place 1 tablet (0.4 mg total) under the tongue every 5 (five) minutes  as needed for chest pain. N, Disp: 25 tablet, Rfl: 3   pantoprazole (PROTONIX) 40 MG tablet, Take 1 tablet (40 mg total) by mouth daily., Disp: 90 tablet, Rfl: 3   PARoxetine (PAXIL) 20 MG tablet, TAKE 1 TABLET(20 MG) BY MOUTH DAILY (Patient taking differently: Take 20 mg by mouth daily.), Disp: 90 tablet, Rfl: 3   sacubitril-valsartan (ENTRESTO) 97-103 MG, Take 1 tablet by mouth 2 (two) times daily., Disp: 60 tablet, Rfl: 5   spironolactone (ALDACTONE) 25 MG tablet, Take 1 tablet (25 mg total) by mouth daily., Disp: 90 tablet, Rfl: 3   tirzepatide (MOUNJARO) 2.5 MG/0.5ML Pen, Inject 2.5 mg into the skin once a week. E11.9, Disp: 2 mL, Rfl: 11 Allergies  Allergen Reactions   Penicillin G Other (See Comments)    Did it involve swelling of the face/tongue/throat, SOB, or low BP?  N/A Did it involve sudden or severe rash/hives, skin peeling, or any reaction on the inside of your mouth or nose? N/A Did you need to seek medical attention at a hospital or doctor's office? N/A When did it last happen? Child If all above answers are "NO", may proceed with cephalosporin use.   Penicillins Other (See Comments)    Did it involve swelling of the face/tongue/throat, SOB, or low BP? N/A Did it involve sudden or severe rash/hives, skin peeling, or any reaction on the inside of your mouth or nose?N/A Did you need to seek medical attention at a hospital or doctor's office? N/A When did it last happen? Child      If all above answers are "NO", may proceed with cephalosporin use.     Social History   Socioeconomic History   Marital status: Single    Spouse name: Not on file   Number of children: 0   Years of education: Not on file   Highest education level: 10th grade  Occupational History   Occupation: retired  Tobacco Use   Smoking status: Former    Packs/day: 0.50    Years: 30.00    Additional pack years: 0.00    Total pack years: 15.00    Types: Cigarettes   Smokeless tobacco: Never  Vaping  Use   Vaping Use: Never used  Substance and Sexual Activity   Alcohol use: No   Drug use: No    Comment: remote marijuana use   Sexual activity: Not Currently  Other Topics Concern   Not on file  Social History Narrative   Lives alone.  He  has no children.   He retired early due to health problems.         Social Determinants of Health   Financial Resource Strain: Low Risk  (09/05/2022)   Overall Financial Resource Strain (CARDIA)    Difficulty of Paying Living Expenses: Not very hard  Food Insecurity: No Food Insecurity (09/19/2022)   Hunger Vital Sign    Worried About Running Out of Food in the Last Year: Never true    Ran Out of Food in the Last Year: Never true  Transportation Needs: No Transportation Needs (09/19/2022)   PRAPARE - Administrator, Civil Service (Medical): No    Lack of Transportation (Non-Medical): No  Physical Activity: Inactive (09/20/2021)   Exercise Vital Sign    Days of Exercise per Week: 0 days    Minutes of Exercise per Session: 0 min  Stress: No Stress Concern Present (09/20/2021)   [employer] of Occupational Health - Occupational Stress Questionnaire    Feeling of Stress : Not at all  Social Connections: Socially Isolated (09/20/2021)   Social Connection and Isolation Panel [NHANES]    Frequency of Communication with Friends and Family: More than three times a week    Frequency of Social Gatherings with Friends and Family: More than three times a week    Attends Religious Services: Never    Database administrator or Organizations: No    Attends Banker Meetings: Never    Marital Status: Never married  Intimate Partner Violence: Not At Risk (09/15/2022)   Humiliation, Afraid, Rape, and Kick questionnaire    Fear of Current or Ex-Partner: No    Emotionally Abused: No    Physically Abused: No    Sexually Abused: No    Physical Exam      Future Appointments  Date Time Provider Department Center  09/27/2022  11:30 AM Colletta Maryland, RN [facility] None  09/28/2022  9:00 AM Corwin Levins, MD [facility] None  10/01/2022 10:40 AM Dorthula Nettles, DO [facility] None  11/12/2022 10:00 AM Helane Gunther, DPM [facility]  11/15/2022  7:05 AM [facility] DEVICE REMOTES [facility]  11/20/2022  2:45 PM Mealor, Roberts Gaudy, MD [facility]  01/25/2023  8:20 AM Corwin Levins, MD [facility] None  02/14/2023  7:05 AM [facility] DEVICE REMOTES [facility]  05/16/2023  7:05 AM [facility] DEVICE REMOTES [facility]  08/15/2023  7:05 AM [facility] DEVICE REMOTES [facility]  11/14/2023  7:05 AM [facility] DEVICE REMOTES [facility]  02/13/2024  7:05 AM [facility] DEVICE REMOTES [facility]  05/14/2024  7:05 AM [facility] DEVICE REMOTES [facility]  08/13/2024  7:05 AM [facility] DEVICE REMOTES [facility]

## 2022-09-27 ENCOUNTER — Ambulatory Visit: Payer: Self-pay

## 2022-09-27 NOTE — Patient Instructions (Addendum)
Visit Information  Thank you for taking time to visit with me today. Please don't hesitate to contact me if I can be of assistance to you.   Following are the goals we discussed today:   Goals Addressed             This Visit's Progress    continue to improve post hospitalization       Interventions Today    Flowsheet Row Most Recent Value  Chronic Disease   Chronic disease during today's visit Diabetes, Congestive Heart Failure (CHF), Other  [post discharge from hospital: acute cystitis/ urinary tract infection, AKI.]  General Interventions   General Interventions Discussed/Reviewed General Interventions Discussed, Doctor Visits  Doctor Visits Discussed/Reviewed Doctor Visits Discussed, Specialist  [confirms Paramedicone Team is making home visits. reviewed upcoming provider visits]  PCP/Specialist Visits Compliance with follow-up visit  Exercise Interventions   Exercise Discussed/Reviewed Physical Activity  Education Interventions   Education Provided Provided Education  Provided Verbal Education On Other, Insurance Plans, Medication  [reviewd signs/symptoms of HF exacerbation, confirmed patient has completed antibiotics, encouraged to continue to take medications as prescribed. encouraged to remain as active as tolerated. Provided number for for [facility] navigator re: meals, BP cuff.]  Pharmacy Interventions   Pharmacy Dicussed/Reviewed Pharmacy Topics Discussed  [medication review completed. patient reports he has all medications prescribed. adds Paramedicine team assisted with getting his medications straight.]            Our next appointment is by telephone on 10/12/22 at 1:30 pm  Please call the care guide team at [phone] if you need to cancel or reschedule your appointment.   If you are experiencing a Mental Health or Behavioral Health Crisis or need someone to talk to, please call the Suicide and Crisis Lifeline: 65  Kathyrn Sheriff, RN, MSN, BSN, CCM [facility] Care  Coordinator [phone]

## 2022-09-27 NOTE — Patient Outreach (Signed)
  Care Coordination   Initial Visit Note   09/27/2022 Name: Idriss Quackenbush MRN: [id number] DOB: [birth year]  Nikolaus Pienta is a 67 y.o. year old male who sees Corwin Levins, MD for primary care. I spoke with  [name] by phone today.  What matters to the patients health and wellness today?  Admitted 09/15/22-09/18/22 acute cystitis/UTI/AKI.  History of CHF-Active with paramedicine team. Mr. Stauber reports he was not doing what he needed to be doing to care for self before, but states is doing better now with taking care of himself. He states he is weighing self daily and taking medications. He reports blood sugar this am was 98. Mr Salguero reports weight today was around 295 lb. He reports still feeling weak, but  states he is doing better since discharge from hospital.  Goals Addressed             This Visit's Progress    continue to improve post hospitalization       Interventions Today    Flowsheet Row Most Recent Value  Chronic Disease   Chronic disease during today's visit Diabetes, Congestive Heart Failure (CHF), Other  [post discharge from hospital: acute cystitis/ urinary tract infection, AKI.]  General Interventions   General Interventions Discussed/Reviewed General Interventions Discussed, Doctor Visits  Doctor Visits Discussed/Reviewed Doctor Visits Discussed, Specialist  [confirms Paramedicone Team is making home visits. reviewed upcoming provider visits]  PCP/Specialist Visits Compliance with follow-up visit  Exercise Interventions   Exercise Discussed/Reviewed Physical Activity  Education Interventions   Education Provided Provided Education  Provided Verbal Education On Other, Insurance Plans, Medication  [reviewd signs/symptoms of HF exacerbation, confirmed patient has completed antibiotics, encouraged to continue to take medications as prescribed. encouraged to remain as active as tolerated. Provided number for for [facility] navigator re: meals, BP cuff.]  Pharmacy Interventions    Pharmacy Dicussed/Reviewed Pharmacy Topics Discussed  [medication review completed. patient reports he has all medications prescribed. adds Paramedicine team assisted with getting his medications straight.]            SDOH assessments and interventions completed:  No  Care Coordination Interventions:  Yes, provided   Follow up plan: Follow up call scheduled for 10/12/22    Encounter Outcome:  Pt. Visit Completed   Kathyrn Sheriff, RN, MSN, BSN, CCM [facility] Care Coordinator [phone]

## 2022-09-28 ENCOUNTER — Ambulatory Visit (INDEPENDENT_AMBULATORY_CARE_PROVIDER_SITE_OTHER): Payer: 59 | Admitting: Internal Medicine

## 2022-09-28 ENCOUNTER — Encounter: Payer: Self-pay | Admitting: Internal Medicine

## 2022-09-28 VITALS — BP 132/80 | HR 62 | Temp 98.3°F | Ht 73.0 in | Wt 301.0 lb

## 2022-09-28 DIAGNOSIS — N390 Urinary tract infection, site not specified: Secondary | ICD-10-CM

## 2022-09-28 DIAGNOSIS — N189 Chronic kidney disease, unspecified: Secondary | ICD-10-CM

## 2022-09-28 DIAGNOSIS — Z7985 Long-term (current) use of injectable non-insulin antidiabetic drugs: Secondary | ICD-10-CM | POA: Diagnosis not present

## 2022-09-28 DIAGNOSIS — I5042 Chronic combined systolic (congestive) and diastolic (congestive) heart failure: Secondary | ICD-10-CM | POA: Diagnosis not present

## 2022-09-28 DIAGNOSIS — N179 Acute kidney failure, unspecified: Secondary | ICD-10-CM | POA: Diagnosis not present

## 2022-09-28 DIAGNOSIS — E1159 Type 2 diabetes mellitus with other circulatory complications: Secondary | ICD-10-CM

## 2022-09-28 NOTE — Progress Notes (Unsigned)
Patient ID: [name] [name], male   DOB: 16-Oct-1955, 67 y.o.   MRN: [id number]        Chief Complaint: follow up recent hospn apr 6 - 9 2024 with uti, chf, dm, AKI       HPI:  [name] [name] is a 67 y.o. male here overall much improved; Denies urinary symptoms such as dysuria, frequency, urgency, flank pain, hematuria or n/v, fever, chills.  Pt denies chest pain, increased sob or doe, wheezing, orthopnea, PND, increased LE swelling, palpitations, dizziness or syncope. Home wt today was 295.  CBG 78 this am, does not yet have full appetite back yet.   Pt denies fever, wt loss, night sweats, loss of appetite, or other constitutional symptoms   Transitional Care Management elements noted today: 1)  Date of D/C: as above 2)  Medication reconciliation:  done today at end visit 3)  Review of D/C summary or other information:  done today 4)  Review of need for f/u on pending diagnostic tests and treatments:  done today - none 5)  Review of need for Interaction with other providers who will assume or resume care of pt specific problems: done today - cardiology apr 22 6)  Education of patient/family/guardian or caregiver: none needed today  Wt Readings from Last 3 Encounters:  09/28/22 (!) 301 lb (136.5 kg)  09/25/22 292 lb (132.5 kg)  09/18/22 294 lb 8.6 oz (133.6 kg)   BP Readings from Last 3 Encounters:  09/28/22 132/80  09/25/22 (!) 140/100  09/18/22 (!) 110/56         Past Medical History:  Diagnosis Date   CAD (coronary artery disease)    a. s/p BMS pRCA 2002; b. DES to pRCA & DES p/m RCA 2006; c. PCI/DES OM4 2007; d. PCI/CBA to RCA for ISR 10/2006; e.08/2012 STEMI/Cath/PCI:  LAD 95% >> PCI: Promus Prem DES  //  f. NSTEMI 10/15 >> LHC: mLAD stent ok, dLAD 70, OM1 CTO, OM2 stent ok, dOM2 90, OM3 30-40, dLCx 90, p-mRCA stent ok, mRCA stent ok w/ 60-70 ISR, EF 50% >> med Rx // MV 1/17: no ischemia // Cath ([facility]) 05/2019: RCA 100 (CTO)    Combined systolic and diastolic CHF    a. Myoview 1/17: EF  35%  //  b. Echo 1/17 EF 45-50% // Echo 05/2019: EF 50-55 // Echocardiogram 8/21: EF 50, inf AK, post and mid inf HK, mod LVH, RVSP 25.3, mild to mod LAE, trivial MR    Depression    Diabetes mellitus    a. A1c 8.8 08/2012->Metformin initiated. => b. A1c (9/14): 6.6   Gastroesophageal reflux disease    History of nuclear stress test    a. Myoview 1/17: EF 35%, fixed inferior lateral defect consistent with infarct, no ischemia, intermediate risk   History of pneumonia    History of stroke 01/2021   Zio Monitor 11/22: NSR, avg HR 61; PVCs (1.9%); no AFib, no high grade HB, no ventricular arrhythmias   HTN (hypertension)    Hx of NSTEMI    Hyperlipidemia    Ischemic cardiomyopathy    a. EF 40%; improved to normal;  b. 08/2012 Echo: EF 50-55%, mod LVH.//  c. Echo 9/16: Inf HK, mild LVH, EF 55%, mild LAE, normal RVF, mild RAE, PASP 35 mmHg  //  d. Echo 1/17: EF 45-50%, inferior HK, mild BAE, PASP 33 mmHg   Obesity    OSA (obstructive sleep apnea)    Does not use CPAP as of 05/2011  Tobacco abuse    Past Surgical History:  Procedure Laterality Date   BIV ICD INSERTION CRT-D N/A 08/14/2022   Procedure: BIV ICD INSERTION CRT-D;  Surgeon: Mealor, Roberts Gaudy, MD;  Location: [facility] INVASIVE CV LAB;  Service: Cardiovascular;  Laterality: N/A;   CORONARY ANGIOPLASTY WITH STENT PLACEMENT     CORONARY STENT PLACEMENT  2009   LEFT HEART CATH AND CORONARY ANGIOGRAPHY N/A 08/13/2022   Procedure: LEFT HEART CATH AND CORONARY ANGIOGRAPHY;  Surgeon: Yvonne Kendall, MD;  Location: [facility] INVASIVE CV LAB;  Service: Cardiovascular;  Laterality: N/A;   LEFT HEART CATHETERIZATION WITH CORONARY ANGIOGRAM N/A 08/31/2012   Procedure: LEFT HEART CATHETERIZATION WITH CORONARY ANGIOGRAM;  Surgeon: Kathleene Hazel, MD;  Location: [facility] CATH LAB;  Service: Cardiovascular;  Laterality: N/A;   LEFT HEART CATHETERIZATION WITH CORONARY ANGIOGRAM N/A 11/16/2013   Procedure: LEFT HEART CATHETERIZATION WITH CORONARY ANGIOGRAM;   Surgeon: Micheline Chapman, MD;  Location: [facility] CATH LAB;  Service: Cardiovascular;  Laterality: N/A;   LEFT HEART CATHETERIZATION WITH CORONARY ANGIOGRAM N/A 03/15/2014   Procedure: LEFT HEART CATHETERIZATION WITH CORONARY ANGIOGRAM;  Surgeon: Peter M Swaziland, MD;  Location: [facility] CATH LAB;  Service: Cardiovascular;  Laterality: N/A;   PERCUTANEOUS CORONARY STENT INTERVENTION (PCI-S) Right 08/31/2012   Procedure: PERCUTANEOUS CORONARY STENT INTERVENTION (PCI-S);  Surgeon: Kathleene Hazel, MD;  Location: [facility] CATH LAB;  Service: Cardiovascular;  Laterality: Right;   PERCUTANEOUS CORONARY STENT INTERVENTION (PCI-S)  11/16/2013   Procedure: PERCUTANEOUS CORONARY STENT INTERVENTION (PCI-S);  Surgeon: Micheline Chapman, MD;  Location: [facility] CATH LAB;  Service: Cardiovascular;;    reports that he has quit smoking. His smoking use included cigarettes. He has a 15.00 pack-year smoking history. He has never used smokeless tobacco. He reports that he does not drink alcohol and does not use drugs. family history includes Cancer in his mother; Coronary artery disease in an other family member; Depression in his mother; Hypertension in his mother; Other in his father. Allergies  Allergen Reactions   Penicillin G Other (See Comments)    Did it involve swelling of the face/tongue/throat, SOB, or low BP?  N/A Did it involve sudden or severe rash/hives, skin peeling, or any reaction on the inside of your mouth or nose? N/A Did you need to seek medical attention at a hospital or doctor's office? N/A When did it last happen? Child If all above answers are "NO", may proceed with cephalosporin use.   Sglt2 Inhibitors Other (See Comments)    Hospn April 2024 with UTI, DKA   Penicillins Other (See Comments)    Did it involve swelling of the face/tongue/throat, SOB, or low BP? N/A Did it involve sudden or severe rash/hives, skin peeling, or any reaction on the inside of your mouth or nose?N/A Did you need to seek medical  attention at a hospital or doctor's office? N/A When did it last happen? Child      If all above answers are "NO", may proceed with cephalosporin use.   Current Outpatient Medications on File Prior to Visit  Medication Sig Dispense Refill   acetaminophen (TYLENOL) 500 MG tablet Take 1,000 mg by mouth every 6 (six) hours as needed for moderate pain.     albuterol (VENTOLIN HFA) 108 (90 Base) MCG/ACT inhaler Inhale 2 puffs into the lungs every 6 (six) hours as needed for wheezing or shortness of breath. 8 g 11   Alcohol Swabs (B-D SINGLE USE SWABS BUTTERFLY) PADS Use up to 4 times a day to test blood  sugars. Dx: E10.9, E11.9 100 each 11   amiodarone (PACERONE) 200 MG tablet Take 1 tablet (200 mg total) by mouth daily. 90 tablet 3   aspirin EC 81 MG tablet Take 1 tablet (81 mg total) by mouth daily. Swallow whole. 90 tablet 3   atorvastatin (LIPITOR) 80 MG tablet Take 1 tablet (80 mg total) by mouth every evening. 90 tablet 3   Blood Glucose Calibration (TRUE METRIX LEVEL 1) Low SOLN Use as needed. Dx: E10.9, E11.9 1 each 0   Blood Glucose Monitoring Suppl (ONETOUCH VERIO FLEX SYSTEM) DEVI 1 Device by Does not apply route in the morning, at noon, in the evening, and at bedtime. E11.9 1 each 0   carvedilol (COREG) 6.25 MG tablet Take 1 tablet (6.25 mg total) by mouth 2 (two) times daily with a meal. 180 tablet 3   clopidogrel (PLAVIX) 75 MG tablet Take 1 tablet (75 mg total) by mouth daily. 90 tablet 3   dorzolamide-timolol (COSOPT) 2-0.5 % ophthalmic solution Place 1 drop into both eyes See admin instructions. Instill one drop into both eyes twice in the morning and once at night     ezetimibe (ZETIA) 10 MG tablet Take 1 tablet (10 mg total) by mouth daily. 90 tablet 3   furosemide (LASIX) 40 MG tablet Take 40 mg by mouth as needed for fluid or edema. As needed for 3lb weight gain     gabapentin (NEURONTIN) 300 MG capsule Take 1 capsule (300 mg total) by mouth 3 (three) times daily as needed.  (Patient taking differently: Take 300 mg by mouth daily.) 270 capsule 1   glucose blood (ONETOUCH VERIO) test strip Use as instructed four times per day E11.9 400 each 12   hydrocortisone (ANUSOL-HC) 2.5 % rectal cream Place rectally 3 (three) times daily. 30 g 0   isosorbide-hydrALAZINE (BIDIL) 20-37.5 MG tablet Take 1 tablet by mouth 3 (three) times daily. 90 tablet 3   Lancets MISC Use as directed four times per day E11.9 400 each 3   mexiletine (MEXITIL) 250 MG capsule Take 1 capsule (250 mg total) by mouth every 12 (twelve) hours. 180 capsule 3   nitroGLYCERIN (NITROSTAT) 0.4 MG SL tablet Place 1 tablet (0.4 mg total) under the tongue every 5 (five) minutes as needed for chest pain. N 25 tablet 3   pantoprazole (PROTONIX) 40 MG tablet Take 1 tablet (40 mg total) by mouth daily. 90 tablet 3   PARoxetine (PAXIL) 20 MG tablet TAKE 1 TABLET(20 MG) BY MOUTH DAILY 90 tablet 3   sacubitril-valsartan (ENTRESTO) 97-103 MG Take 1 tablet by mouth 2 (two) times daily. 60 tablet 5   spironolactone (ALDACTONE) 25 MG tablet Take 1 tablet (25 mg total) by mouth daily. 90 tablet 3   tirzepatide (MOUNJARO) 2.5 MG/0.5ML Pen Inject 2.5 mg into the skin once a week. E11.9 2 mL 11   No current facility-administered medications on file prior to visit.        ROS:  All others reviewed and negative.  Objective        PE:  BP 132/80 (BP Location: Right Arm, Patient Position: Sitting, Cuff Size: Normal)   Pulse 62   Temp 98.3 F (36.8 C) (Oral)   Ht  (1.854 m)   Wt (!) 301 lb (136.5 kg)   SpO2 98%   BMI 39.71 kg/m                 Constitutional: Pt appears in NAD  HENT: Head: NCAT.                Right Ear: External ear normal.                 Left Ear: External ear normal.                Eyes: . Pupils are equal, round, and reactive to light. Conjunctivae and EOM are normal               Nose: without d/c or deformity               Neck: Neck supple. Gross normal ROM                Cardiovascular: Normal rate and regular rhythm.                 Pulmonary/Chest: Effort normal and breath sounds without rales or wheezing.                Abd:  Soft, NT, ND, + BS, no organomegaly               Neurological: Pt is alert. At baseline orientation, motor grossly intact               Skin: Skin is warm. No rashes, no other new lesions, LE edema - trace bilateral               Psychiatric: Pt behavior is normal without agitation   Micro: none  Cardiac tracings I have personally interpreted today:  none  Pertinent Radiological findings (summarize): none   Lab Results  Component Value Date   WBC 9.1 09/17/2022   HGB 16.2 09/17/2022   HCT 47.4 09/17/2022   PLT 168 09/17/2022   GLUCOSE 91 09/18/2022   CHOL 212 (H) 08/12/2022   TRIG 104 08/12/2022   HDL 46 08/12/2022   LDLCALC 145 (H) 08/12/2022   ALT 19 09/16/2022   AST 15 09/16/2022   NA 137 09/18/2022   K 4.4 09/18/2022   CL 107 09/18/2022   CREATININE 2.05 (H) 09/18/2022   BUN 36 (H) 09/18/2022   CO2 20 (L) 09/18/2022   TSH 0.924 09/15/2022   PSA 1.40 07/27/2022   INR 1.0 09/15/2022   HGBA1C 7.3 (H) 07/27/2022   MICROALBUR 2.9 (H) 07/27/2022   Assessment/Plan:  [name] [name] is a 67 y.o. Black or African American [2] male with  has a past medical history of CAD (coronary artery disease), Combined systolic and diastolic CHF, Depression, Diabetes mellitus, Gastroesophageal reflux disease, History of nuclear stress test, History of pneumonia, History of stroke (01/2021), HTN (hypertension), Hx of NSTEMI, Hyperlipidemia, Ischemic cardiomyopathy, Obesity, OSA (obstructive sleep apnea), and Tobacco abuse.  Chronic combined systolic and diastolic heart failure Stable volume, cont same tx, for cardiology apr 22 as planned  Acute kidney injury superimposed on chronic kidney disease Improved at recent d/c;  Lab Results  Component Value Date   CREATININE 2.05 (H) 09/18/2022   Declines f/u lab today  UTI (urinary  tract infection) Clinically resolved, cont to follow  DM2 (diabetes mellitus, type 2) (HCC) Lab Results  Component Value Date   HGBA1C 7.3 (H) 07/27/2022   Mild uncontrolled, appetite not back yet post uti and has low sugar 78 today,, pt to continue current medical treatment mounjaro 2.5 mg , but consider increase to 5 mg eventually  Followup: Return in about 4 months (around 01/28/2023).  Oliver Barre, MD 09/30/2022 6:48 AM  [facility]

## 2022-09-28 NOTE — Patient Instructions (Addendum)
Your Mychart username is [email or web address]   You made a new password today - [phone]  Please continue all other medications as before, but ok to call after another 2-4 weeks after you feel better for the increased mounjaro to 5 mg weekly  Please have the pharmacy call with any other refills you may need.  Please continue your efforts at being more active, low cholesterol diet, and weight control.  Please keep your appointments with your specialists as you may have planned  Please make an Appointment to return in 4 months, or sooner if needed

## 2022-09-30 ENCOUNTER — Encounter: Payer: Self-pay | Admitting: Internal Medicine

## 2022-09-30 NOTE — Assessment & Plan Note (Signed)
Lab Results  Component Value Date   HGBA1C 7.3 (H) 07/27/2022   Mild uncontrolled, appetite not back yet post uti and has low sugar 78 today,, pt to continue current medical treatment mounjaro 2.5 mg , but consider increase to 5 mg eventually

## 2022-09-30 NOTE — Assessment & Plan Note (Signed)
Improved at recent d/c;  Lab Results  Component Value Date   CREATININE 2.05 (H) 09/18/2022   Declines f/u lab today

## 2022-09-30 NOTE — Assessment & Plan Note (Signed)
Clinically resolved, cont to follow 

## 2022-09-30 NOTE — Assessment & Plan Note (Signed)
Stable volume, cont same tx, for cardiology apr 22 as planned

## 2022-10-01 ENCOUNTER — Telehealth (HOSPITAL_COMMUNITY): Payer: Self-pay | Admitting: Licensed Clinical Social Worker

## 2022-10-01 ENCOUNTER — Ambulatory Visit (HOSPITAL_COMMUNITY)
Admission: RE | Admit: 2022-10-01 | Discharge: 2022-10-01 | Disposition: A | Discharge status: Home / Self Care | Payer: 59 | Source: Ambulatory Visit | Attending: Cardiology | Admitting: Cardiology

## 2022-10-01 ENCOUNTER — Encounter (HOSPITAL_COMMUNITY): Payer: 59 | Admitting: Cardiology

## 2022-10-01 ENCOUNTER — Encounter (HOSPITAL_COMMUNITY): Payer: Self-pay | Admitting: Cardiology

## 2022-10-01 VITALS — BP 124/70 | HR 72 | Wt 299.8 lb

## 2022-10-01 DIAGNOSIS — R9431 Abnormal electrocardiogram [ECG] [EKG]: Secondary | ICD-10-CM | POA: Insufficient documentation

## 2022-10-01 DIAGNOSIS — I2510 Atherosclerotic heart disease of native coronary artery without angina pectoris: Secondary | ICD-10-CM

## 2022-10-01 DIAGNOSIS — Z950 Presence of cardiac pacemaker: Secondary | ICD-10-CM | POA: Diagnosis not present

## 2022-10-01 DIAGNOSIS — N1831 Chronic kidney disease, stage 3a: Secondary | ICD-10-CM | POA: Diagnosis not present

## 2022-10-01 DIAGNOSIS — I472 Ventricular tachycardia, unspecified: Secondary | ICD-10-CM

## 2022-10-01 DIAGNOSIS — I5042 Chronic combined systolic (congestive) and diastolic (congestive) heart failure: Secondary | ICD-10-CM

## 2022-10-01 DIAGNOSIS — I5032 Chronic diastolic (congestive) heart failure: Secondary | ICD-10-CM | POA: Diagnosis not present

## 2022-10-01 NOTE — Progress Notes (Signed)
ADVANCED HEART FAILURE CLINIC NOTE  Referring Physician: Corwin Levins, MD  Primary Care: Corwin Levins, MD Primary Cardiologist: Dr. [name]  HPI: [name] [name] is a 67 y.o. male with type 2 diabetes, multivessel CAD, hyperlipidemia, hypertension, history of SVT and VT, ischemic cardiomyopathy and systolic heart failure presenting today to establish care. [name] [name] reports his cardiac history dates back to 10-15 years ago when he had a MI; this was followed by two more Mis according to him.   Based on chart review, [name] [name] has multivessel CAD and has followed with Dr. [name] for several years now.  He was most recently admitted in March 2024 with a VT; 2.5-minute run of sustained VT.  At that time he was loaded with amiodarone and started on mexiletine.  He had a left heart cath during that admission that demonstrated multivessel CAD including 99% in-stent stenosis of mid RCA stent with occlusion of the distal RCA, 90% stenosis of the apical LAD; these were unfortunately not amenable to PCI and unchanged [facility].  He was discharged on GDMT and since that time has followed in [facility] clinic where medications were further uptitrated.  Interval hx: From a functional standpoint, [name] [name] reports doing all ADLs without difficulty or assistance. He can go to the grocery store, however, has to stop due to fatigue and mild SOB after walking 2-3 aisles. He had a BiV ICD placed in early march 2024 he does feel some important in his energy levels and ability to get around.   Activity level/exercise tolerance:  NYHA IIB-III Orthopnea:  Sleeps on 2 pillows Paroxysmal noctural dyspnea:  Infrequent Chest pain/pressure:  No Orthostatic lightheadedness:  NO Palpitations:  NO Lower extremity edema:  No Presyncope/syncope:  No Cough:  No  Past Medical History:  Diagnosis Date   CAD (coronary artery disease)    a. s/p BMS pRCA 2002; b. DES to pRCA & DES p/m RCA 2006; c. PCI/DES OM4 2007;  d. PCI/CBA to RCA for ISR 10/2006; e.08/2012 STEMI/Cath/PCI:  LAD 95% >> PCI: Promus Prem DES  //  f. NSTEMI 10/15 >> LHC: mLAD stent ok, dLAD 70, OM1 CTO, OM2 stent ok, dOM2 90, OM3 30-40, dLCx 90, p-mRCA stent ok, mRCA stent ok w/ 60-70 ISR, EF 50% >> med Rx // MV 1/17: no ischemia // Cath ([facility]) 05/2019: RCA 100 (CTO)    Combined systolic and diastolic CHF    a. Myoview 1/17: EF 35%  //  b. Echo 1/17 EF 45-50% // Echo 05/2019: EF 50-55 // Echocardiogram 8/21: EF 50, inf AK, post and mid inf HK, mod LVH, RVSP 25.3, mild to mod LAE, trivial MR    Depression    Diabetes mellitus (HCC)    a. A1c 8.8 08/2012->Metformin initiated. => b. A1c (9/14): 6.6   Gastroesophageal reflux disease    History of nuclear stress test    a. Myoview 1/17: EF 35%, fixed inferior lateral defect consistent with infarct, no ischemia, intermediate risk   History of pneumonia    History of stroke 01/2021   Zio Monitor 11/22: NSR, avg HR 61; PVCs (1.9%); no AFib, no high grade HB, no ventricular arrhythmias   HTN (hypertension)    Hx of NSTEMI    Hyperlipidemia    Ischemic cardiomyopathy    a. EF 40%; improved to normal;  b. 08/2012 Echo: EF 50-55%, mod LVH.//  c. Echo 9/16: Inf HK, mild LVH, EF 55%, mild LAE, normal RVF, mild RAE, PASP  35 mmHg  //  d. Echo 1/17: EF 45-50%, inferior HK, mild BAE, PASP 33 mmHg   Obesity    OSA (obstructive sleep apnea)    Does not use CPAP as of 05/2011   Tobacco abuse     Current Outpatient Medications  Medication Sig Dispense Refill   acetaminophen (TYLENOL) 500 MG tablet Take 1,000 mg by mouth every 6 (six) hours as needed for moderate pain.     albuterol (VENTOLIN HFA) 108 (90 Base) MCG/ACT inhaler Inhale 2 puffs into the lungs every 6 (six) hours as needed for wheezing or shortness of breath. 8 g 11   Alcohol Swabs (B-D SINGLE USE SWABS BUTTERFLY) PADS Use up to 4 times a day to test blood sugars. Dx: E10.9, E11.9 100 each 11   amiodarone (PACERONE) 200 MG tablet Take 1 tablet (200  mg total) by mouth daily. 90 tablet 3   aspirin EC 81 MG tablet Take 1 tablet (81 mg total) by mouth daily. Swallow whole. 90 tablet 3   atorvastatin (LIPITOR) 80 MG tablet Take 1 tablet (80 mg total) by mouth every evening. 90 tablet 3   Blood Glucose Calibration (TRUE METRIX LEVEL 1) Low SOLN Use as needed. Dx: E10.9, E11.9 1 each 0   Blood Glucose Monitoring Suppl (ONETOUCH VERIO FLEX SYSTEM) DEVI 1 Device by Does not apply route in the morning, at noon, in the evening, and at bedtime. E11.9 1 each 0   carvedilol (COREG) 6.25 MG tablet Take 1 tablet (6.25 mg total) by mouth 2 (two) times daily with a meal. 180 tablet 3   clopidogrel (PLAVIX) 75 MG tablet Take 1 tablet (75 mg total) by mouth daily. 90 tablet 3   dorzolamide-timolol (COSOPT) 2-0.5 % ophthalmic solution Place 1 drop into both eyes See admin instructions. Instill one drop into both eyes twice in the morning and once at night     ezetimibe (ZETIA) 10 MG tablet Take 1 tablet (10 mg total) by mouth daily. 90 tablet 3   furosemide (LASIX) 40 MG tablet Take 40 mg by mouth as needed for fluid or edema. As needed for 3lb weight gain     gabapentin (NEURONTIN) 300 MG capsule Take 300 mg by mouth daily.     glucose blood (ONETOUCH VERIO) test strip Use as instructed four times per day E11.9 400 each 12   isosorbide-hydrALAZINE (BIDIL) 20-37.5 MG tablet Take 1 tablet by mouth 3 (three) times daily. 90 tablet 3   Lancets MISC Use as directed four times per day E11.9 400 each 3   mexiletine (MEXITIL) 250 MG capsule Take 1 capsule (250 mg total) by mouth every 12 (twelve) hours. 180 capsule 3   nitroGLYCERIN (NITROSTAT) 0.4 MG SL tablet Place 1 tablet (0.4 mg total) under the tongue every 5 (five) minutes as needed for chest pain. N 25 tablet 3   pantoprazole (PROTONIX) 40 MG tablet Take 1 tablet (40 mg total) by mouth daily. 90 tablet 3   PARoxetine (PAXIL) 20 MG tablet TAKE 1 TABLET(20 MG) BY MOUTH DAILY 90 tablet 3   sacubitril-valsartan  (ENTRESTO) 97-103 MG Take 1 tablet by mouth 2 (two) times daily. 60 tablet 5   spironolactone (ALDACTONE) 25 MG tablet Take 1 tablet (25 mg total) by mouth daily. 90 tablet 3   tirzepatide (MOUNJARO) 2.5 MG/0.5ML Pen Inject 2.5 mg into the skin once a week. E11.9 2 mL 11   No current facility-administered medications for this encounter.    Allergies  Allergen Reactions  Penicillin G Other (See Comments)    Did it involve swelling of the face/tongue/throat, SOB, or low BP?  N/A Did it involve sudden or severe rash/hives, skin peeling, or any reaction on the inside of your mouth or nose? N/A Did you need to seek medical attention at a hospital or doctor's office? N/A When did it last happen? Child If all above answers are "NO", may proceed with cephalosporin use.   Sglt2 Inhibitors Other (See Comments)    [facility]osp[facility] April 2024 with UTI, DKA   Penicillins Other (See Comments)    Did it involve swelling of the face/tongue/throat, SOB, or low BP? N/A Did it involve sudden or severe rash/hives, skin peeling, or any reaction on the inside of your mouth or nose?N/A Did you need to seek medical attention at a hospital or doctor's office? N/A When did it last happen? Child      If all above answers are "NO", may proceed with cephalosporin use.      Social History   Socioeconomic History   Marital status: Single    Spouse name: Not on file   Number of children: 0   Years of education: Not on file   Highest education level: 10th grade  Occupational History   Occupation: retired  Tobacco Use   Smoking status: Former    Packs/day: 0.50    Years: 30.00    Additional pack years: 0.00    Total pack years: 15.00    Types: Cigarettes   Smokeless tobacco: Never  Vaping Use   Vaping Use: Never used  Substance and Sexual Activity   Alcohol use: No   Drug use: No    Comment: remote marijuana use   Sexual activity: Not Currently  Other Topics Concern   Not on file  Social History Narrative    Lives alone.  He has no children.   He retired early due to health problems.         Social Determinants of Health   Financial Resource Strain: Low Risk  (09/05/2022)   Overall Financial Resource Strain (CARDIA)    Difficulty of Paying Living Expenses: Not very hard  Food Insecurity: No Food Insecurity (09/19/2022)   Hunger Vital Sign    Worried About Running Out of Food in the Last Year: Never true    Ran Out of Food in the Last Year: Never true  Transportation Needs: Unmet Transportation Needs (10/01/2022)   PRAPARE - Administrator, Civil Service (Medical): Yes    Lack of Transportation (Non-Medical): No  Physical Activity: Inactive (09/20/2021)   Exercise Vital Sign    Days of Exercise per Week: 0 days    Minutes of Exercise per Session: 0 min  Stress: No Stress Concern Present (09/20/2021)   [name] [name] of Occupational Health - Occupational Stress Questionnaire    Feeling of Stress : Not at all  Social Connections: Socially Isolated (09/20/2021)   Social Connection and Isolation Panel [NHANES]    Frequency of Communication with Friends and Family: More than three times a week    Frequency of Social Gatherings with Friends and Family: More than three times a week    Attends Religious Services: Never    Database administrator or Organizations: No    Attends Banker Meetings: Never    Marital Status: Never married  Intimate Partner Violence: Not At Risk (09/15/2022)   Humiliation, Afraid, Rape, and Kick questionnaire    Fear of Current or Ex-Partner: No  Emotionally Abused: No    Physically Abused: No    Sexually Abused: No      Family History  Problem Relation Age of Onset   Depression Mother    Cancer Mother        ovarian   Hypertension Mother        Died, 61   Other Father        motor vehicle accident   Coronary artery disease Other    Heart attack Neg Hx    Stroke Neg Hx     PHYSICAL EXAM: Vitals:   10/01/22 1032  BP:  124/70  Pulse: 72  SpO2: 94%   GENERAL: Well nourished, well developed, and in no apparent distress at rest.  HEENT: Negative for arcus senilis or xanthelasma. There is no scleral icterus.  The mucous membranes are pink and moist.   NECK: Supple, No masses. Normal carotid upstrokes without bruits. No masses or thyromegaly.    CHEST: There are no chest wall deformities. There is no chest wall tenderness. Respirations are unlabored.  Lungs-CTA bilaterally CARDIAC:  JVP: 7 cm H2O         Normal S1, S2  Normal rate with regular rhythm. No murmurs, rubs or gallops.  Pulses are 2+ and symmetrical in upper and lower extremities.  No edema.  ABDOMEN: Soft, non-tender, non-distended. There are no masses or hepatomegaly. There are normal bowel sounds.  EXTREMITIES: Warm and well perfused with no cyanosis, clubbing.  LYMPHATIC: No axillary or supraclavicular lymphadenopathy.  NEUROLOGIC: Patient is oriented x3 with no focal or lateralizing neurologic deficits.  PSYCH: Patients affect is appropriate, there is no evidence of anxiety or depression.  SKIN: Warm and dry; no lesions or wounds.   DATA REVIEW  ECG: 09/17/22:  BiV paced rhythm as per my personal interpretation  ECHO: 08/12/22: LVEF 35%, normal RV function as per my personal interpretation 02/17/23: LVEF 45%-50% with normal RV function. Grade I DD.  01/17/21: LVEF 45-50%, normal RV function as per my interpretation  Cath 08/2022  Multivessel coronary artery disease, including 99% in-stent restenosis of mid RCA stent with occlusion of distal RCA (may be due to competitive flow from left-to-right collaterals).  There is also 90% stenosis of the apical LAD and mild-moderate disease involving the more proximal LAD and LCx. Mildly elevated left ventricular filling pressure (LVEDP 20 mmHg). Tortuous right subclavian/brachiocephalic artery limiting catheter manipulation.  Consider alternate approach for future catheterizations.  Recommendations: Severe  RCA disease is unchanged since 2020 based on cath report from [facility].  Question if VT is related to scar from prior inferior MI. Continue medical therapy and aggressive secondary prevention.  Apical LAD stenosis is too distal for percutaneous intervention. Follow-up EP recommendations regarding management of sustained VT.   Additional Caths 2002 s/p BMS pRCA  2006 - DES to pRCA & DES p/m RCA  2007 -PCI/DES OM4  2008- PCI/CBA to RCA for ISR  2014 -STEMI/Cath/PCI: LAD 95% >> PCI: Promus Prem DES // f. NSTEMI 10/15 >> LHC: mLAD stent ok, dLAD 70, OM1 CTO, OM2 stent ok, dOM2 90, OM3 30-40, dLCx 90, p-mRCA stent ok, mRCA stent ok w/ 60-70 ISR, EF 50% >> med Rx // MV 1/17: no ischemia //  2020- RCA 100 (CTO)    ASSESSMENT & PLAN:  Heart failure with reduced EF Etiology of ZO:XWRUEAVW cardiomyopathy NYHA class / AHA Stage: NYHA IIb but difficult to assess objectively. Volume status & Diuretics: euvolemic, only taking lasix 40mg  PRN Vasodilators: Entresto 97/103 twice daily  Beta-Blocker: Coreg 6.25 mg twice daily, will obtain labs, increase to 12.5 at follow-up MRA: Spironolactone 25 mg daily Cardiometabolic: Start at follow-up Devices therapies & Valvulopathies:CRT-D by Dr. Nelly Laurence in March 2024 Advanced therapies: Not currently indicated  2.  Multivessel CAD -See multiple left heart cath above.  At this point no longer amenable to PCI.  Continue Plavix and aspirin lifelong.  3.  Ventricular tachycardia -Status post Saint Jude BiV on amiodarone and mexiletine.  Follow-up with Dr. Nelly Laurence  4.  Hypertension -Well-controlled today  5.  CKD 3 A -Repeat labs today   [facility] Advanced [facility] Failure Mechanical Circulatory Support

## 2022-10-01 NOTE — Patient Instructions (Signed)
Good to see you today!    Your physician has requested that you have an echocardiogram. Echocardiography is a painless test that uses sound waves to create images of your heart. It provides your doctor with information about the size and shape of your heart and how well your heart's chambers and valves are working. This procedure takes approximately one hour. There are no restrictions for this procedure. Please do NOT wear cologne, perfume, aftershave, or lotions (deodorant is allowed). Please arrive 15 minutes prior to your appointment time.  Your physician recommends that you schedule a follow-up appointment in: 3 months with echocardiogram(July) Call office in May to schedule an appointment  If you have any questions or concerns before your next appointment please send [place] a message through [place] or call our office at [phone].    TO LEAVE A MESSAGE FOR THE NURSE SELECT OPTION 2, PLEASE LEAVE A MESSAGE INCLUDING: YOUR NAME DATE OF BIRTH CALL BACK NUMBER REASON FOR CALL**this is important as we prioritize the call backs  YOU WILL RECEIVE A CALL BACK THE SAME DAY AS LONG AS YOU CALL BEFORE 4:00 PM  At the [facility], you and your health needs are our priority. As part of our continuing mission to provide you with exceptional heart care, we have created designated Provider Care Teams. These Care Teams include your primary Cardiologist (physician) and Advanced Practice Providers (APPs- Physician Assistants and Nurse Practitioners) who all work together to provide you with the care you need, when you need it.   You may see any of the following providers on your designated Care Team at your next follow up: Dr Arvilla Meres Dr Marca Ancona Dr. Marcos Eke, NP Robbie Lis, [place] [facility] [place], [place] Brynda Peon, NP Karle Plumber, PharmD   Please be sure to bring in all your medications bottles to every appointment.    Thank you  for choosing [facility]

## 2022-10-01 NOTE — Telephone Encounter (Signed)
H&V Care Navigation CSW Progress Note  Clinical Social Worker informed pt does not have transport to todays appt.  CSW able to arranged taxi to bring pt today.    SDOH Screenings   Food Insecurity: No Food Insecurity (09/19/2022)  Housing: Low Risk  (09/19/2022)  Transportation Needs: Unmet Transportation Needs (10/01/2022)  Utilities: Not At Risk (09/15/2022)  Alcohol Screen: Low Risk  (08/15/2022)  Depression (PHQ2-9): Low Risk  (09/28/2022)  Financial Resource Strain: Low Risk  (09/05/2022)  Physical Activity: Inactive (09/20/2021)  Social Connections: Socially Isolated (09/20/2021)  Stress: No Stress Concern Present (09/20/2021)  Tobacco Use: Medium Risk (09/30/2022)    10/01/2022  [name] [name] DOB: 1956/04/05 MRN: [id number]   RIDER WAIVER AND RELEASE OF LIABILITY  For the purposes of helping with transportation needs, [facility] partners with outside transportation providers (taxi companies, [place], Catering manager.) to give [employer] patients or other approved people the choice of on-demand rides [employer]") to our buildings for non-emergency visits.  By using [employer], I, the person signing this document, on behalf of myself and/or any legal minors (in my care using the [employer]), agree:  Science writer given to me are supplied by independent, outside transportation providers who do not work for, or have any affiliation with, [employer]. [place] is not a transportation company. [facility] has no control over the quality or safety of the rides I get using [employer]. [place] has no control over whether any outside ride will happen on time or not. [place] gives no guarantee on the reliability, quality, safety, or availability on any rides, or that no mistakes will happen. I know and accept that traveling by vehicle (car, truck, [employer], Zenaida Niece, bus, taxi, etc.) has risks of serious injuries such as disability, being paralyzed, and death. I know and  agree the risk of using [employer] is mine alone, and not [employer]. Transport Services are provided "as is" and as are available. The transportation providers are in charge for all inspections and care of the vehicles used to provide these rides. I agree not to take legal action against [employer], its agents, employees, officers, directors, representatives, insurers, attorneys, assigns, successors, subsidiaries, and affiliates at any time for any reasons related directly or indirectly to using [employer]. I also agree not to take legal action against [place] or its affiliates for any injury, death, or damage to property caused by or related to using [employer]. I have read this Waiver and Release of Liability, and I understand the terms used in it and their legal meaning. This Waiver is freely and voluntarily given with the understanding that my right (or any legal minors) to legal action against [facility] relating to [employer] is knowingly given up to use these services.   I attest that I read the Ride Waiver and Release of Liability to [name] [name], gave Mr. Bevis the opportunity to ask questions and answered the questions asked (if any). I affirm that [name] [name] then provided consent for assistance with transportation.     Burna Sis

## 2022-10-02 ENCOUNTER — Other Ambulatory Visit (HOSPITAL_COMMUNITY): Payer: Self-pay | Admitting: Emergency Medicine

## 2022-10-02 NOTE — Progress Notes (Signed)
Paramedicine Encounter    Patient ID: [name] [name], male    DOB: 17-Jun-1955, 67 y.o.   MRN: [id number]   Complaints NONE  Assessment A&O x 4, skin W&D w/ good color.  Lung sounds clear throughout and no peripheral edema noted  Compliance with meds 100%  Pill box filled by pt. Prior to my arrival and reviewed for accuracy.  Refills needed none  Meds changes since last visit none    Social changes none   BP (!) 160/100 (BP Location: Left Arm, Patient Position: Sitting, Cuff Size: Normal)   Pulse 74   Wt 295 lb (133.8 kg)   SpO2 95%   BMI 38.92 kg/m  Weight yesterday-not taken Last visit weight-292lb   Home visit with [name] [name] today done on the front porch of his apartment outside just like our initial visit.  On my arrival pt was almost complete with filling his pill box.  I allow him to finish and reviewed it for accuracy.  Everything found to be in order except he was missing his Pantoprazole and Paxil.  He advised he thinks his Protonix is on his night stand and says he only takes the Pantaprazole PRN.   Pt. Denies chest pain or any unusual SOB.   ACTION: Home visit completed  Bethanie Dicker [phone] 10/02/22  Patient Care Team: Corwin Levins, MD as PCP - General (Internal Medicine) Tonny Bollman, MD as PCP - Cardiology (Cardiology) Tonny Bollman, MD as Consulting Physician (Cardiology) Kennon Rounds as Physician Assistant (Cardiology) Quintella Reichert, MD as Consulting Physician (Sleep Medicine) Ellin Saba, [facility] (Inactive) as Pharmacist (Pharmacist)  Patient Active Problem List   Diagnosis Date Noted   UTI (urinary tract infection) 09/16/2022   Acute cystitis 09/15/2022   Generalized weakness 09/15/2022   High anion gap metabolic acidosis 09/15/2022   Paroxysmal ventricular tachycardia 08/11/2022   Rash 02/25/2022   Hypotension 02/16/2022   Near syncope 02/15/2022   Hearing loss of left ear due to cerumen impaction 12/15/2021    Asthma 09/17/2021   PVC's (premature ventricular contractions) 09/03/2021   SVT (supraventricular tachycardia) 09/03/2021   History of TIA (transient ischemic attack) 02/05/2021   History of stroke 01/2021   Aortic atherosclerosis 12/26/2020   Cough 12/26/2020   Vitamin D deficiency 04/03/2020   B12 deficiency 04/03/2020   Itching 11/22/2019   Chronic heart failure with preserved ejection fraction (HFpEF) 05/13/2019   Elevated troponin 05/13/2019   Morbid obesity with BMI of 40.0-44.9, adult 05/13/2019   Stage 3a chronic kidney disease 05/13/2019   Pre-ulcerative calluses 02/03/2019   Foot injury, left, initial encounter 11/26/2018   Chest pain 05/30/2017   Hypokalemia 05/30/2017   Allergic rhinitis 12/18/2016   Lumbar radiculitis 09/05/2016   Lumbar spinal stenosis 09/05/2016   Acute kidney injury superimposed on chronic kidney disease 12/29/2015   Chest pain syndrome 12/15/2015   Acute renal failure superimposed on stage 3a chronic kidney disease 12/15/2015   Atypical chest pain 12/15/2015   Encounter for well adult exam with abnormal findings 06/27/2015   Chronic low back pain 06/27/2015   Left lumbar radiculopathy 04/13/2015   Bilateral plantar fasciitis 04/13/2015   Essential hypertension 03/09/2015   OSA (obstructive sleep apnea) 03/27/2014   Peripheral neuropathy (HCC) 12/29/2013   External hemorrhoid, thrombosed 12/03/2013   Sinus bradycardia 11/15/2013   Chronic combined systolic and diastolic heart failure 11/14/2013   Coronary artery disease involving native coronary artery of native heart without angina pectoris    Hyperlipidemia  DM2 (diabetes mellitus, type 2) 06/26/2013   Gait disorder 06/26/2013   Obesity, Class III, BMI 40-49.9 (morbid obesity) (HCC) 09/10/2012   Former smoker 09/02/2012   Ischemic cardiomyopathy- EF 50-55% June 2015    Non-ST elevation (NSTEMI) myocardial infarction 09/01/2012   HEMORRHOIDS 12/26/2009   Acute prostatitis 12/26/2009    GANGLION CYST, WRIST, RIGHT 11/28/2009   COLONIC POLYPS 10/07/2009   Depression 07/21/2009   DYSPNEA 03/24/2009   GASTROESOPHAGEAL REFLUX DISEASE 01/27/2007   PERCUTANEOUS TRANSLUMINAL CORONARY ANGIOPLASTY, HX OF 02/09/2006    Current Outpatient Medications:    acetaminophen (TYLENOL) 500 MG tablet, Take 1,000 mg by mouth every 6 (six) hours as needed for moderate pain., Disp: , Rfl:    albuterol (VENTOLIN HFA) 108 (90 Base) MCG/ACT inhaler, Inhale 2 puffs into the lungs every 6 (six) hours as needed for wheezing or shortness of breath., Disp: 8 g, Rfl: 11   Alcohol Swabs (B-D SINGLE USE SWABS BUTTERFLY) PADS, Use up to 4 times a day to test blood sugars. Dx: E10.9, E11.9, Disp: 100 each, Rfl: 11   amiodarone (PACERONE) 200 MG tablet, Take 1 tablet (200 mg total) by mouth daily., Disp: 90 tablet, Rfl: 3   aspirin EC 81 MG tablet, Take 1 tablet (81 mg total) by mouth daily. Swallow whole., Disp: 90 tablet, Rfl: 3   atorvastatin (LIPITOR) 80 MG tablet, Take 1 tablet (80 mg total) by mouth every evening., Disp: 90 tablet, Rfl: 3   Blood Glucose Calibration (TRUE METRIX LEVEL 1) Low SOLN, Use as needed. Dx: E10.9, E11.9, Disp: 1 each, Rfl: 0   Blood Glucose Monitoring Suppl (ONETOUCH VERIO FLEX SYSTEM) DEVI, 1 Device by Does not apply route in the morning, at noon, in the evening, and at bedtime. E11.9, Disp: 1 each, Rfl: 0   carvedilol (COREG) 6.25 MG tablet, Take 1 tablet (6.25 mg total) by mouth 2 (two) times daily with a meal., Disp: 180 tablet, Rfl: 3   clopidogrel (PLAVIX) 75 MG tablet, Take 1 tablet (75 mg total) by mouth daily., Disp: 90 tablet, Rfl: 3   dorzolamide-timolol (COSOPT) 2-0.5 % ophthalmic solution, Place 1 drop into both eyes See admin instructions. Instill one drop into both eyes twice in the morning and once at night, Disp: , Rfl:    ezetimibe (ZETIA) 10 MG tablet, Take 1 tablet (10 mg total) by mouth daily., Disp: 90 tablet, Rfl: 3   furosemide (LASIX) 40 MG tablet, Take  40 mg by mouth as needed for fluid or edema. As needed for 3lb weight gain, Disp: , Rfl:    gabapentin (NEURONTIN) 300 MG capsule, Take 300 mg by mouth daily., Disp: , Rfl:    glucose blood (ONETOUCH VERIO) test strip, Use as instructed four times per day E11.9, Disp: 400 each, Rfl: 12   isosorbide-hydrALAZINE (BIDIL) 20-37.5 MG tablet, Take 1 tablet by mouth 3 (three) times daily., Disp: 90 tablet, Rfl: 3   Lancets MISC, Use as directed four times per day E11.9, Disp: 400 each, Rfl: 3   mexiletine (MEXITIL) 250 MG capsule, Take 1 capsule (250 mg total) by mouth every 12 (twelve) hours., Disp: 180 capsule, Rfl: 3   nitroGLYCERIN (NITROSTAT) 0.4 MG SL tablet, Place 1 tablet (0.4 mg total) under the tongue every 5 (five) minutes as needed for chest pain. N, Disp: 25 tablet, Rfl: 3   sacubitril-valsartan (ENTRESTO) 97-103 MG, Take 1 tablet by mouth 2 (two) times daily., Disp: 60 tablet, Rfl: 5   spironolactone (ALDACTONE) 25 MG tablet, Take 1  tablet (25 mg total) by mouth daily., Disp: 90 tablet, Rfl: 3   tirzepatide (MOUNJARO) 2.5 MG/0.5ML Pen, Inject 2.5 mg into the skin once a week. E11.9, Disp: 2 mL, Rfl: 11   pantoprazole (PROTONIX) 40 MG tablet, Take 1 tablet (40 mg total) by mouth daily., Disp: 90 tablet, Rfl: 3   PARoxetine (PAXIL) 20 MG tablet, TAKE 1 TABLET(20 MG) BY MOUTH DAILY, Disp: 90 tablet, Rfl: 3 Allergies  Allergen Reactions   Penicillin G Other (See Comments)    Did it involve swelling of the face/tongue/throat, SOB, or low BP?  N/A Did it involve sudden or severe rash/hives, skin peeling, or any reaction on the inside of your mouth or nose? N/A Did you need to seek medical attention at a hospital or doctor's office? N/A When did it last happen? Child If all above answers are "NO", may proceed with cephalosporin use.   Sglt2 Inhibitors Other (See Comments)    Hospn April 2024 with UTI, DKA   Penicillins Other (See Comments)    Did it involve swelling of the face/tongue/throat,  SOB, or low BP? N/A Did it involve sudden or severe rash/hives, skin peeling, or any reaction on the inside of your mouth or nose?N/A Did you need to seek medical attention at a hospital or doctor's office? N/A When did it last happen? Child      If all above answers are "NO", may proceed with cephalosporin use.     Social History   Socioeconomic History   Marital status: Single    Spouse name: Not on file   Number of children: 0   Years of education: Not on file   Highest education level: 10th grade  Occupational History   Occupation: retired  Tobacco Use   Smoking status: Former    Packs/day: 0.50    Years: 30.00    Additional pack years: 0.00    Total pack years: 15.00    Types: Cigarettes   Smokeless tobacco: Never  Vaping Use   Vaping Use: Never used  Substance and Sexual Activity   Alcohol use: No   Drug use: No    Comment: remote marijuana use   Sexual activity: Not Currently  Other Topics Concern   Not on file  Social History Narrative   Lives alone.  He has no children.   He retired early due to health problems.         Social Determinants of Health   Financial Resource Strain: Low Risk  (09/05/2022)   Overall Financial Resource Strain (CARDIA)    Difficulty of Paying Living Expenses: Not very hard  Food Insecurity: No Food Insecurity (09/19/2022)   Hunger Vital Sign    Worried About Running Out of Food in the Last Year: Never true    Ran Out of Food in the Last Year: Never true  Transportation Needs: Unmet Transportation Needs (10/01/2022)   PRAPARE - Administrator, Civil Service (Medical): Yes    Lack of Transportation (Non-Medical): No  Physical Activity: Inactive (09/20/2021)   Exercise Vital Sign    Days of Exercise per Week: 0 days    Minutes of Exercise per Session: 0 min  Stress: No Stress Concern Present (09/20/2021)   [employer] of Occupational Health - Occupational Stress Questionnaire    Feeling of Stress : Not at all   Social Connections: Socially Isolated (09/20/2021)   Social Connection and Isolation Panel [NHANES]    Frequency of Communication with Friends and Family: More than  three times a week    Frequency of Social Gatherings with Friends and Family: More than three times a week    Attends Religious Services: Never    Database administrator or Organizations: No    Attends Banker Meetings: Never    Marital Status: Never married  Intimate Partner Violence: Not At Risk (09/15/2022)   Humiliation, Afraid, Rape, and Kick questionnaire    Fear of Current or Ex-Partner: No    Emotionally Abused: No    Physically Abused: No    Sexually Abused: No    Physical Exam      Future Appointments  Date Time Provider Department Center  10/12/2022  1:30 PM Colletta Maryland, RN [facility] None  10/17/2022  8:00 PM Lennette Bihari, MD [facility]  11/12/2022 10:00 AM Helane Gunther, DPM [facility]  11/15/2022  7:05 AM [facility] DEVICE REMOTES [facility]  11/20/2022  2:45 PM Mealor, Roberts Gaudy, MD [facility]  01/25/2023  8:20 AM Corwin Levins, MD [facility] None  02/14/2023  7:05 AM [facility] DEVICE REMOTES [facility]  05/16/2023  7:05 AM [facility] DEVICE REMOTES [facility]  08/15/2023  7:05 AM [facility] DEVICE REMOTES [facility]  11/14/2023  7:05 AM [facility] DEVICE REMOTES [facility]  02/13/2024  7:05 AM [facility] DEVICE REMOTES [facility]  05/14/2024  7:05 AM [facility] DEVICE REMOTES [facility]  08/13/2024  7:05 AM [facility] DEVICE REMOTES [facility]

## 2022-10-03 ENCOUNTER — Telehealth (HOSPITAL_COMMUNITY): Payer: Self-pay | Admitting: Licensed Clinical Social Worker

## 2022-10-03 NOTE — Telephone Encounter (Signed)
HF Paramedicine Team Based Care Meeting  One Month Post Enrollment Assessment  HF MD- NA  HF NP - Amy Clegg NP-C   [facility] HF Paramedicine  Katie Vicente Males  [facility] admit within the last 30 days for heart failure? no  Medications concerns? Filled his own pill box yesterday before visit and he was accurate  Transportation issues? Has his own car  Education needs? Seems to have good grasp on diagnosis.  SDOH concerns? Nothing acute at this time  [facility] Referrals Placed: will consider when up for DC but does not appear to need any follow up  Barriers to discharge? He is new to paramedicine so would like to complete a few more home visits to make sure he is ok then can move towards DC.     Burna Sis, LCSW Clinical Social Worker [facility] Desk#: [phone] Cell#: [phone]

## 2022-10-04 ENCOUNTER — Telehealth: Payer: Self-pay | Admitting: Internal Medicine

## 2022-10-04 NOTE — Telephone Encounter (Signed)
Contacted [name] to schedule their annual wellness visit. Appointment made for 10/11/2022.  [facility] Care Guide [facility] AWV TEAM Direct Dial: [phone]

## 2022-10-09 ENCOUNTER — Telehealth (HOSPITAL_COMMUNITY): Payer: Self-pay | Admitting: Emergency Medicine

## 2022-10-09 NOTE — Telephone Encounter (Signed)
Mr. Speckman text me last night @ 8:09 to cancel his home visit for 4/30 stating he will be out of town until Sunday 10/14/22.     Beatrix Shipper, EMT-Paramedic [phone] 10/09/2022

## 2022-10-11 ENCOUNTER — Ambulatory Visit (INDEPENDENT_AMBULATORY_CARE_PROVIDER_SITE_OTHER): Payer: 59

## 2022-10-11 VITALS — Ht 73.0 in | Wt 301.0 lb

## 2022-10-11 DIAGNOSIS — Z0000 Encounter for general adult medical examination without abnormal findings: Secondary | ICD-10-CM

## 2022-10-11 DIAGNOSIS — Z1211 Encounter for screening for malignant neoplasm of colon: Secondary | ICD-10-CM

## 2022-10-11 NOTE — Patient Instructions (Signed)
[name] [name] , Thank you for taking time to come for your Medicare Wellness Visit. I appreciate your ongoing commitment to your health goals. Please review the following plan we discussed and let me know if I can assist you in the future.   These are the goals we discussed:  Goals      continue to improve post hospitalization     Interventions Today    Flowsheet Row Most Recent Value  Chronic Disease   Chronic disease during today's visit Diabetes, Congestive Heart Failure (CHF), Other  [post discharge from hospital: acute cystitis/ urinary tract infection, AKI.]  General Interventions   General Interventions Discussed/Reviewed General Interventions Discussed, Doctor Visits  Doctor Visits Discussed/Reviewed Doctor Visits Discussed, Specialist  [confirms [facility] Team is making home visits. reviewed upcoming provider visits]  PCP/Specialist Visits Compliance with follow-up visit  Exercise Interventions   Exercise Discussed/Reviewed Physical Activity  Education Interventions   Education Provided Provided Education  Provided Verbal Education On Other, Insurance Plans, Medication  [reviewd signs/symptoms of HF exacerbation, confirmed patient has completed antibiotics, encouraged to continue to take medications as prescribed. encouraged to remain as active as tolerated. Provided number for for [employer] [facility] navigator re: meals, BP cuff.]  Pharmacy Interventions   Pharmacy Dicussed/Reviewed Pharmacy Topics Discussed  [medication review completed. patient reports he has all medications prescribed. adds [facility]amed[facility] team assisted with getting his medications straight.]           Monitor and Manage My Blood Sugar-Diabetes Type 2     Timeframe:  Long-Range Goal Priority:  High Start Date: 05/08/2021                            Expected End Date:  N/A               Follow Up Date 10/2023   - check blood sugar at prescribed times - check blood sugar if I feel it is too high or too low - enter blood  sugar readings and medication or insulin into daily log - take the blood sugar log to all doctor visits - take the blood sugar meter to all doctor visits    Why is this important?   Checking your blood sugar at home helps to keep it from getting very high or very low.  Writing the results in a diary or log helps the doctor know how to care for you.  Your blood sugar log should have the time, date and the results.  Also, write down the amount of insulin or other medicine that you take.  Other information, like what you ate, exercise done and how you were feeling, will also be helpful.       Track and Manage My Blood Pressure-Hypertension     Timeframe:  Long-Range Goal Priority:  High Start Date:   05/08/2021                          Expected End Date:  N/A        Follow Up Date 10/2023   - check blood pressure daily - choose a place to take my blood pressure (home, clinic or office, retail store) - write blood pressure results in a log or diary    Why is this important?   You won't feel high blood pressure, but it can still hurt your blood vessels.  High blood pressure can cause heart or kidney problems. It can  also cause a stroke.  Making lifestyle changes like losing a little weight or eating less salt will help.  Checking your blood pressure at home and at different times of the day can help to control blood pressure.  If the doctor prescribes medicine remember to take it the way the doctor ordered.  Call the office if you cannot afford the medicine or if there are questions about it.     Notes:         This is a list of the screening recommended for you and due dates:  Health Maintenance  Topic Date Due   DTaP/Tdap/Td vaccine (2 - Tdap) 03/25/2019   Eye exam for diabetics  10/04/2022   COVID-19 Vaccine (6 - 2023-24 season) 10/14/2022*   Zoster (Shingles) Vaccine (2 of 2) 10/25/2022*   Colon Cancer Screening  07/28/2023*   Complete foot exam   12/07/2022   Flu Shot   01/10/2023   Hemoglobin A1C  01/25/2023   Yearly kidney health urinalysis for diabetes  07/28/2023   Yearly kidney function blood test for diabetes  09/18/2023   Medicare Annual Wellness Visit  10/11/2023   Pneumonia Vaccine  Completed   Hepatitis C Screening: USPSTF Recommendation to screen - Ages 18-79 yo.  Completed   HIV Screening  Completed   HPV Vaccine  Aged Out  *Topic was postponed. The date shown is not the original due date.    Advanced directives: No; Advance directive discussed with you today. Even though you declined this today please call our office should you change your mind and we can give you the proper paperwork for you to fill out.  Conditions/risks identified: Yes; Type II Diabetes & Hypertension  Next appointment: Follow up in one year for your annual wellness visit.   Preventive Care 66 Years and Older, Male  Preventive care refers to lifestyle choices and visits with your health care provider that can promote health and wellness. What does preventive care include? A yearly physical exam. This is also called an annual well check. Dental exams once or twice a year. Routine eye exams. Ask your health care provider how often you should have your eyes checked. Personal lifestyle choices, including: Daily care of your teeth and gums. Regular physical activity. Eating a healthy diet. Avoiding tobacco and drug use. Limiting alcohol use. Practicing safe sex. Taking low doses of aspirin every day. Taking vitamin and mineral supplements as recommended by your health care provider. What happens during an annual well check? The services and screenings done by your health care provider during your annual well check will depend on your age, overall health, lifestyle risk factors, and family history of disease. Counseling  Your health care provider may ask you questions about your: Alcohol use. Tobacco use. Drug use. Emotional well-being. Home and relationship  well-being. Sexual activity. Eating habits. History of falls. Memory and ability to understand (cognition). Work and work Astronomer. Screening  You may have the following tests or measurements: Height, weight, and BMI. Blood pressure. Lipid and cholesterol levels. These may be checked every 5 years, or more frequently if you are over 27 years old. Skin check. Lung cancer screening. You may have this screening every year starting at age 71 if you have a 30-pack-year history of smoking and currently smoke or have quit within the past 15 years. Fecal occult blood test (FOBT) of the stool. You may have this test every year starting at age 97. Flexible sigmoidoscopy or colonoscopy. You may have a sigmoidoscopy  every 5 years or a colonoscopy every 10 years starting at age 57. Prostate cancer screening. Recommendations will vary depending on your family history and other risks. Hepatitis C blood test. Hepatitis B blood test. Sexually transmitted disease (STD) testing. Diabetes screening. This is done by checking your blood sugar (glucose) after you have not eaten for a while (fasting). You may have this done every 1-3 years. Abdominal aortic aneurysm (AAA) screening. You may need this if you are a current or former smoker. Osteoporosis. You may be screened starting at age 46 if you are at high risk. Talk with your health care provider about your test results, treatment options, and if necessary, the need for more tests. Vaccines  Your health care provider may recommend certain vaccines, such as: Influenza vaccine. This is recommended every year. Tetanus, diphtheria, and acellular pertussis (Tdap, Td) vaccine. You may need a Td booster every 10 years. Zoster vaccine. You may need this after age 20. Pneumococcal 13-valent conjugate (PCV13) vaccine. One dose is recommended after age 66. Pneumococcal polysaccharide (PPSV23) vaccine. One dose is recommended after age 33. Talk to your health care  provider about which screenings and vaccines you need and how often you need them. This information is not intended to replace advice given to you by your health care provider. Make sure you discuss any questions you have with your health care provider. Document Released: 06/24/2015 Document Revised: 02/15/2016 Document Reviewed: 03/29/2015 Elsevier Interactive Patient Education  2017 [employer].  Fall Prevention in the Home Falls can cause injuries. They can happen to people of all ages. There are many things you can do to make your home safe and to help prevent falls. What can I do on the outside of my home? Regularly fix the edges of walkways and driveways and fix any cracks. Remove anything that might make you trip as you walk through a door, such as a raised step or threshold. Trim any bushes or trees on the path to your home. Use bright outdoor lighting. Clear any walking paths of anything that might make someone trip, such as rocks or tools. Regularly check to see if handrails are loose or broken. Make sure that both sides of any steps have handrails. Any raised decks and porches should have guardrails on the edges. Have any leaves, snow, or ice cleared regularly. Use sand or salt on walking paths during winter. Clean up any spills in your garage right away. This includes oil or grease spills. What can I do in the bathroom? Use night lights. Install grab bars by the toilet and in the tub and shower. Do not use towel bars as grab bars. Use non-skid mats or decals in the tub or shower. If you need to sit down in the shower, use a plastic, non-slip stool. Keep the floor dry. Clean up any water that spills on the floor as soon as it happens. Remove soap buildup in the tub or shower regularly. Attach bath mats securely with double-sided non-slip rug tape. Do not have throw rugs and other things on the floor that can make you trip. What can I do in the bedroom? Use night lights. Make  sure that you have a light by your bed that is easy to reach. Do not use any sheets or blankets that are too big for your bed. They should not hang down onto the floor. Have a firm chair that has side arms. You can use this for support while you get dressed. Do not have  throw rugs and other things on the floor that can make you trip. What can I do in the kitchen? Clean up any spills right away. Avoid walking on wet floors. Keep items that you use a lot in easy-to-reach places. If you need to reach something above you, use a strong step stool that has a grab bar. Keep electrical cords out of the way. Do not use floor polish or wax that makes floors slippery. If you must use wax, use non-skid floor wax. Do not have throw rugs and other things on the floor that can make you trip. What can I do with my stairs? Do not leave any items on the stairs. Make sure that there are handrails on both sides of the stairs and use them. Fix handrails that are broken or loose. Make sure that handrails are as long as the stairways. Check any carpeting to make sure that it is firmly attached to the stairs. Fix any carpet that is loose or worn. Avoid having throw rugs at the top or bottom of the stairs. If you do have throw rugs, attach them to the floor with carpet tape. Make sure that you have a light switch at the top of the stairs and the bottom of the stairs. If you do not have them, ask someone to add them for you. What else can I do to help prevent falls? Wear shoes that: Do not have high heels. Have rubber bottoms. Are comfortable and fit you well. Are closed at the toe. Do not wear sandals. If you use a stepladder: Make sure that it is fully opened. Do not climb a closed stepladder. Make sure that both sides of the stepladder are locked into place. Ask someone to hold it for you, if possible. Clearly mark and make sure that you can see: Any grab bars or handrails. First and last steps. Where the  edge of each step is. Use tools that help you move around (mobility aids) if they are needed. These include: Canes. Walkers. Scooters. Crutches. Turn on the lights when you go into a dark area. Replace any light bulbs as soon as they burn out. Set up your furniture so you have a clear path. Avoid moving your furniture around. If any of your floors are uneven, fix them. If there are any pets around you, be aware of where they are. Review your medicines with your doctor. Some medicines can make you feel dizzy. This can increase your chance of falling. Ask your doctor what other things that you can do to help prevent falls. This information is not intended to replace advice given to you by your health care provider. Make sure you discuss any questions you have with your health care provider. Document Released: 03/24/2009 Document Revised: 11/03/2015 Document Reviewed: 07/02/2014 Elsevier Interactive Patient Education  2017 [employer].

## 2022-10-11 NOTE — Progress Notes (Signed)
I connected with  [name] on 10/11/22 by a audio enabled telemedicine application and verified that I am speaking with the correct person using two identifiers.  Patient Location: Home  Provider Location: Office/Clinic  I discussed the limitations of evaluation and management by telemedicine. The patient expressed understanding and agreed to proceed.  Subjective:   Tayson Schnelle is a 67 y.o. male who presents for Medicare Annual/Subsequent preventive examination.  Review of Systems     Cardiac Risk Factors include: advanced age (>52men, >18 women);diabetes mellitus;family history of premature cardiovascular disease;hypertension;male gender;obesity (BMI >30kg/m2);sedentary lifestyle;dyslipidemia     Objective:    Today's Vitals   10/11/22 1433  Weight: (!) 301 lb (136.5 kg)  Height: 6\' 1"  (1.854 m)  PainSc: 0-No pain   Body mass index is 39.71 kg/m.     10/11/2022    2:35 PM 09/15/2022    9:00 PM 09/15/2022    6:58 PM 09/03/2022    7:58 PM 08/15/2022    6:00 PM 02/16/2022    6:39 AM 02/15/2022   11:18 AM  Advanced Directives  Does Patient Have a Medical Advance Directive? No No No No No No No  Would patient like information on creating a medical advance directive? No - Patient declined No - Patient declined  No - Patient declined No - Patient declined No - Patient declined No - Patient declined    Current Medications (verified) Outpatient Encounter Medications as of 10/11/2022  Medication Sig   acetaminophen (TYLENOL) 500 MG tablet Take 1,000 mg by mouth every 6 (six) hours as needed for moderate pain.   albuterol (VENTOLIN HFA) 108 (90 Base) MCG/ACT inhaler Inhale 2 puffs into the lungs every 6 (six) hours as needed for wheezing or shortness of breath.   Alcohol Swabs (B-D SINGLE USE SWABS BUTTERFLY) PADS Use up to 4 times a day to test blood sugars. Dx: E10.9, E11.9   amiodarone (PACERONE) 200 MG tablet Take 1 tablet (200 mg total) by mouth daily.   aspirin EC 81 MG tablet  Take 1 tablet (81 mg total) by mouth daily. Swallow whole.   atorvastatin (LIPITOR) 80 MG tablet Take 1 tablet (80 mg total) by mouth every evening.   Blood Glucose Calibration (TRUE METRIX LEVEL 1) Low SOLN Use as needed. Dx: E10.9, E11.9   Blood Glucose Monitoring Suppl (ONETOUCH VERIO FLEX SYSTEM) DEVI 1 Device by Does not apply route in the morning, at noon, in the evening, and at bedtime. E11.9   carvedilol (COREG) 6.25 MG tablet Take 1 tablet (6.25 mg total) by mouth 2 (two) times daily with a meal.   clopidogrel (PLAVIX) 75 MG tablet Take 1 tablet (75 mg total) by mouth daily.   dorzolamide-timolol (COSOPT) 2-0.5 % ophthalmic solution Place 1 drop into both eyes See admin instructions. Instill one drop into both eyes twice in the morning and once at night   ezetimibe (ZETIA) 10 MG tablet Take 1 tablet (10 mg total) by mouth daily.   furosemide (LASIX) 40 MG tablet Take 40 mg by mouth as needed for fluid or edema. As needed for 3lb weight gain   gabapentin (NEURONTIN) 300 MG capsule Take 300 mg by mouth daily.   glucose blood (ONETOUCH VERIO) test strip Use as instructed four times per day E11.9   isosorbide-hydrALAZINE (BIDIL) 20-37.5 MG tablet Take 1 tablet by mouth 3 (three) times daily.   Lancets MISC Use as directed four times per day E11.9   mexiletine (MEXITIL) 250 MG capsule Take 1 capsule (  250 mg total) by mouth every 12 (twelve) hours.   nitroGLYCERIN (NITROSTAT) 0.4 MG SL tablet Place 1 tablet (0.4 mg total) under the tongue every 5 (five) minutes as needed for chest pain. N   pantoprazole (PROTONIX) 40 MG tablet Take 1 tablet (40 mg total) by mouth daily.   PARoxetine (PAXIL) 20 MG tablet TAKE 1 TABLET(20 MG) BY MOUTH DAILY   sacubitril-valsartan (ENTRESTO) 97-103 MG Take 1 tablet by mouth 2 (two) times daily.   spironolactone (ALDACTONE) 25 MG tablet Take 1 tablet (25 mg total) by mouth daily.   tirzepatide [facility]) 2.5 MG/0.5ML Pen Inject 2.5 mg into the skin once a week.  E11.9   No facility-administered encounter medications on file as of 10/11/2022.    Allergies (verified) Penicillin g, Sglt2 inhibitors, and Penicillins   History: Past Medical History:  Diagnosis Date   CAD (coronary artery disease)    a. s/p BMS pRCA 2002; b. DES to pRCA & DES p/m RCA 2006; c. PCI/DES OM4 2007; d. PCI/CBA to RCA for ISR 10/2006; e.08/2012 STEMI/Cath/PCI:  LAD 95% >> PCI: Promus Prem DES  //  f. NSTEMI 10/15 >> LHC: mLAD stent ok, dLAD 70, OM1 CTO, OM2 stent ok, dOM2 90, OM3 30-40, dLCx 90, p-mRCA stent ok, mRCA stent ok w/ 60-70 ISR, EF 50% >> med Rx // MV 1/17: no ischemia // Cath ([facility]) 05/2019: RCA 100 (CTO)    Combined systolic and diastolic CHF    a. Myoview 1/17: EF 35%  //  b. Echo 1/17 EF 45-50% // Echo 05/2019: EF 50-55 // Echocardiogram 8/21: EF 50, inf AK, post and mid inf HK, mod LVH, RVSP 25.3, mild to mod LAE, trivial MR    Depression    Diabetes mellitus (HCC)    a. A1c 8.8 08/2012->Metformin initiated. => b. A1c (9/14): 6.6   Gastroesophageal reflux disease    History of nuclear stress test    a. Myoview 1/17: EF 35%, fixed inferior lateral defect consistent with infarct, no ischemia, intermediate risk   History of pneumonia    History of stroke 01/2021   Zio Monitor 11/22: NSR, avg HR 61; PVCs (1.9%); no AFib, no high grade HB, no ventricular arrhythmias   HTN (hypertension)    Hx of NSTEMI    Hyperlipidemia    Ischemic cardiomyopathy    a. EF 40%; improved to normal;  b. 08/2012 Echo: EF 50-55%, mod LVH.//  c. Echo 9/16: Inf HK, mild LVH, EF 55%, mild LAE, normal RVF, mild RAE, PASP 35 mmHg  //  d. Echo 1/17: EF 45-50%, inferior HK, mild BAE, PASP 33 mmHg   Obesity    OSA (obstructive sleep apnea)    Does not use CPAP as of 05/2011   Tobacco abuse    Past Surgical History:  Procedure Laterality Date   BIV ICD INSERTION CRT-D N/A 08/14/2022   Procedure: BIV ICD INSERTION CRT-D;  Surgeon: Maurice Small, MD;  Location: [facility] INVASIVE CV LAB;  Service:  Cardiovascular;  Laterality: N/A;   CORONARY ANGIOPLASTY WITH STENT PLACEMENT     CORONARY STENT PLACEMENT  2009   LEFT HEART CATH AND CORONARY ANGIOGRAPHY N/A 08/13/2022   Procedure: LEFT HEART CATH AND CORONARY ANGIOGRAPHY;  Surgeon: Yvonne Kendall, MD;  Location: [facility] INVASIVE CV LAB;  Service: Cardiovascular;  Laterality: N/A;   LEFT HEART CATHETERIZATION WITH CORONARY ANGIOGRAM N/A 08/31/2012   Procedure: LEFT HEART CATHETERIZATION WITH CORONARY ANGIOGRAM;  Surgeon: Kathleene Hazel, MD;  Location: [facility] CATH LAB;  Service:  Cardiovascular;  Laterality: N/A;   LEFT HEART CATHETERIZATION WITH CORONARY ANGIOGRAM N/A 11/16/2013   Procedure: LEFT HEART CATHETERIZATION WITH CORONARY ANGIOGRAM;  Surgeon: Micheline Chapman, MD;  Location: [facility] CATH LAB;  Service: Cardiovascular;  Laterality: N/A;   LEFT HEART CATHETERIZATION WITH CORONARY ANGIOGRAM N/A 03/15/2014   Procedure: LEFT HEART CATHETERIZATION WITH CORONARY ANGIOGRAM;  Surgeon: Peter M Swaziland, MD;  Location: [facility] CATH LAB;  Service: Cardiovascular;  Laterality: N/A;   PERCUTANEOUS CORONARY STENT INTERVENTION (PCI-S) Right 08/31/2012   Procedure: PERCUTANEOUS CORONARY STENT INTERVENTION (PCI-S);  Surgeon: Kathleene Hazel, MD;  Location: [facility] CATH LAB;  Service: Cardiovascular;  Laterality: Right;   PERCUTANEOUS CORONARY STENT INTERVENTION (PCI-S)  11/16/2013   Procedure: PERCUTANEOUS CORONARY STENT INTERVENTION (PCI-S);  Surgeon: Micheline Chapman, MD;  Location: [facility] CATH LAB;  Service: Cardiovascular;;   Family History  Problem Relation Age of Onset   Depression Mother    Cancer Mother        ovarian   Hypertension Mother        Died, 24   Other Father        motor vehicle accident   Coronary artery disease Other    Heart attack Neg Hx    Stroke Neg Hx    Social History   Socioeconomic History   Marital status: Single    Spouse name: Not on file   Number of children: 0   Years of education: Not on file   Highest education level:  10th grade  Occupational History   Occupation: retired  Tobacco Use   Smoking status: Former    Packs/day: 0.50    Years: 30.00    Additional pack years: 0.00    Total pack years: 15.00    Types: Cigarettes   Smokeless tobacco: Never  Vaping Use   Vaping Use: Never used  Substance and Sexual Activity   Alcohol use: No   Drug use: No    Comment: remote marijuana use   Sexual activity: Not Currently  Other Topics Concern   Not on file  Social History Narrative   Lives alone.  He has no children.   He retired early due to health problems.         Social Determinants of Health   Financial Resource Strain: Low Risk  (10/11/2022)   Overall Financial Resource Strain (CARDIA)    Difficulty of Paying Living Expenses: Not hard at all  Food Insecurity: No Food Insecurity (10/11/2022)   Hunger Vital Sign    Worried About Running Out of Food in the Last Year: Never true    Ran Out of Food in the Last Year: Never true  Transportation Needs: No Transportation Needs (10/11/2022)   PRAPARE - Administrator, Civil Service (Medical): No    Lack of Transportation (Non-Medical): No  Recent Concern: Transportation Needs - Unmet Transportation Needs (10/01/2022)   PRAPARE - Administrator, Civil Service (Medical): Yes    Lack of Transportation (Non-Medical): No  Physical Activity: Inactive (10/11/2022)   Exercise Vital Sign    Days of Exercise per Week: 0 days    Minutes of Exercise per Session: 0 min  Stress: No Stress Concern Present (10/11/2022)   [employer] of Occupational Health - Occupational Stress Questionnaire    Feeling of Stress : Not at all  Social Connections: Socially Isolated (10/11/2022)   Social Connection and Isolation Panel [NHANES]    Frequency of Communication with Friends and Family: More than three times  a week    Frequency of Social Gatherings with Friends and Family: More than three times a week    Attends Religious Services: Never    Automotive engineer or Organizations: No    Attends Engineer, structural: Never    Marital Status: Never married    Tobacco Counseling Counseling given: Not Answered   Clinical Intake:  Pre-visit preparation completed: Yes  Pain : No/denies pain Pain Score: 0-No pain     BMI - recorded: 39.71 Nutritional Status: BMI > 30  Obese Nutritional Risks: None Diabetes: No  How often do you need to have someone help you when you read instructions, pamphlets, or other written materials from your doctor or pharmacy?: 1 - Never What is the last grade level you completed in school?: HSG  Nutrition Risk Assessment:  Has the patient had any N/V/D within the last 2 months?  No  Does the patient have any non-healing wounds?  No  Has the patient had any unintentional weight loss or weight gain?  No   Diabetes:  Is the patient diabetic?  Yes  If diabetic, was a CBG obtained today?  No  Did the patient bring in their glucometer from home?  No  How often do you monitor your CBG's? 4 times daily.   Financial Strains and Diabetes Management:  Are you having any financial strains with the device, your supplies or your medication? No .  Does the patient want to be seen by Chronic Care Management for management of their diabetes?  No  Would the patient like to be referred to a Nutritionist or for Diabetic Management?  No   Diabetic Exams:  Diabetic Eye Exam: Completed 10/03/2021; overdue Diabetic Foot Exam: Completed 12/06/2021   Interpreter Needed?: No  Information entered by :: Susie Cassette, LPN.   Activities of Daily Living    10/11/2022    2:37 PM 09/15/2022    9:00 PM  In your present state of health, do you have any difficulty performing the following activities:  Hearing? 0 0  Vision? 0 0  Difficulty concentrating or making decisions? 0 0  Walking or climbing stairs? 0 1  Dressing or bathing? 0 0  Doing errands, shopping? 0 0  Preparing Food and eating ? N   Using  the Toilet? N   In the past six months, have you accidently leaked urine? N   Do you have problems with loss of bowel control? N   Managing your Medications? N   Managing your Finances? N   Housekeeping or managing your Housekeeping? N     Patient Care Team: Corwin Levins, MD as PCP - General (Internal Medicine) Tonny Bollman, MD as PCP - Cardiology (Cardiology) Tonny Bollman, MD as Consulting Physician (Cardiology) Kennon Rounds as Physician Assistant (Cardiology) Quintella Reichert, MD as Consulting Physician (Sleep Medicine) Szabat, Vinnie Level, [facility] (Inactive) as Pharmacist (Pharmacist)  Indicate any recent Medical Services you may have received from other than [facility] providers in the past year (date may be approximate).     Assessment:   This is a routine wellness examination for [facility].  Hearing/Vision screen Hearing Screening - Comments:: Denies hearing difficulties.   Vision Screening - Comments:: Wears rx glasses - up to date with routine eye exams with Ernesto Rutherford, MD.   Dietary issues and exercise activities discussed: Current Exercise Habits: The patient does not participate in regular exercise at present, Exercise limited by: cardiac condition(s);respiratory conditions(s);Other - see comments (  gait disorder)   Goals Addressed             This Visit's Progress    Monitor and Manage My Blood Sugar-Diabetes Type 2       Timeframe:  Long-Range Goal Priority:  High Start Date: 05/08/2021                            Expected End Date:  N/A               Follow Up Date 10/2023   - check blood sugar at prescribed times - check blood sugar if I feel it is too high or too low - enter blood sugar readings and medication or insulin into daily log - take the blood sugar log to all doctor visits - take the blood sugar meter to all doctor visits    Why is this important?   Checking your blood sugar at home helps to keep it from getting very high or very low.   Writing the results in a diary or log helps the doctor know how to care for you.  Your blood sugar log should have the time, date and the results.  Also, write down the amount of insulin or other medicine that you take.  Other information, like what you ate, exercise done and how you were feeling, will also be helpful.       Track and Manage My Blood Pressure-Hypertension       Timeframe:  Long-Range Goal Priority:  High Start Date:   05/08/2021                          Expected End Date:  N/A        Follow Up Date 10/2023   - check blood pressure daily - choose a place to take my blood pressure (home, clinic or office, retail store) - write blood pressure results in a log or diary    Why is this important?   You won't feel high blood pressure, but it can still hurt your blood vessels.  High blood pressure can cause heart or kidney problems. It can also cause a stroke.  Making lifestyle changes like losing a little weight or eating less salt will help.  Checking your blood pressure at home and at different times of the day can help to control blood pressure.  If the doctor prescribes medicine remember to take it the way the doctor ordered.  Call the office if you cannot afford the medicine or if there are questions about it.     Notes:       Depression Screen    10/11/2022    2:37 PM 09/28/2022    9:03 AM 08/21/2022    9:48 AM 07/27/2022    8:16 AM 02/22/2022    3:31 PM 12/14/2021    8:23 AM 09/20/2021    4:17 PM  PHQ 2/9 Scores  PHQ - 2 Score 0 0 0 0  0 0  PHQ- 9 Score 0   0     Exception Documentation     Patient refusal      Fall Risk    10/11/2022    2:36 PM 09/28/2022    9:03 AM 07/27/2022    8:17 AM 02/22/2022    3:31 PM 12/18/2021   10:30 AM  Fall Risk   Falls in the past year? 0 0 0 0 0  Comment     contines to deny new/ recent falls; occasionally uses cane as/if indicated  Number falls in past yr: 0 0 0  0  Injury with Fall? 0 0 0 0 0  Comment     N/A- no falls  reported  Risk for fall due to : No Fall Risks No Fall Risks No Fall Risks No Fall Risks Medication side effect;Other (Comment);Impaired balance/gait  Risk for fall due to: Comment     chronic pain/ occasional balance issues  Follow up Falls prevention discussed Falls evaluation completed Falls evaluation completed Falls evaluation completed Falls prevention discussed    FALL RISK PREVENTION PERTAINING TO THE HOME:  Any stairs in or around the home? Yes  If so, are there any without handrails? No  Home free of loose throw rugs in walkways, pet beds, electrical cords, etc? Yes  Adequate lighting in your home to reduce risk of falls? Yes   ASSISTIVE DEVICES UTILIZED TO PREVENT FALLS:  Life alert? No  Use of a cane, walker or w/c? Yes  Grab bars in the bathroom? No  Shower chair or bench in shower? Yes  Elevated toilet seat or a handicapped toilet? No   TIMED UP AND GO:  Was the test performed? No . Telephonic Visit   Cognitive Function:    03/29/2017    8:45 AM  MMSE - Mini Mental State Exam  Orientation to time 5  Orientation to Place 5  Registration 3  Attention/ Calculation 5  Recall 2  Language- name 2 objects 2  Language- repeat 1  Language- follow 3 step command 3  Language- read & follow direction 1  Write a sentence 1  Copy design 1  Total score 29        10/11/2022    2:46 PM 09/20/2021    4:34 PM  6CIT Screen  What Year? 0 points 0 points  What month? 0 points 0 points  What time? 0 points 0 points  Count back from 20 0 points 0 points  Months in reverse 0 points 0 points  Repeat phrase 0 points 0 points  Total Score 0 points 0 points    Immunizations Immunization History  Administered Date(s) Administered   COVID-19, mRNA, vaccine(Comirnaty)12 years and older 04/17/2022   Fluad Quad(high Dose 65+) 02/22/2022   Influenza Whole 04/12/2008, 04/12/2010, 02/10/2011   Influenza, Seasonal, Injecte, Preservative Fre 03/11/2013   Influenza,inj,Quad PF,6+  Mos 02/12/2015, 05/04/2016, 03/29/2017, 05/05/2018, 05/15/2019, 03/22/2020, 07/12/2021   Moderna Sars-Covid-2 Vaccination 11/03/2019, 12/01/2019, 06/20/2020   PFIZER(Purple Top)SARS-COV-2 Vaccination 11/10/2019   Pneumococcal Conjugate-13 06/26/2013   Pneumococcal Polysaccharide-23 07/21/2009, 06/27/2015, 07/27/2022   Td 03/24/2009   Zoster Recombinat (Shingrix) 04/02/2017    TDAP status: Due, Education has been provided regarding the importance of this vaccine. Advised may receive this vaccine at local pharmacy or Health Dept. Aware to provide a copy of the vaccination record if obtained from local pharmacy or Health Dept. Verbalized acceptance and understanding.  Flu Vaccine status: Up to date  Pneumococcal vaccine status: Up to date  Covid-19 vaccine status: Completed vaccines  Qualifies for Shingles Vaccine? Yes   Zostavax completed No   Shingrix Completed?: No.    Education has been provided regarding the importance of this vaccine. Patient has been advised to call insurance company to determine out of pocket expense if they have not yet received this vaccine. Advised may also receive vaccine at local pharmacy or Health Dept. Verbalized acceptance and understanding.  Screening Tests Health Maintenance  Topic Date Due   DTaP/Tdap/Td (2 - Tdap) 03/25/2019   OPHTHALMOLOGY EXAM  10/04/2022   COVID-19 Vaccine (6 - 2023-24 season) 10/14/2022 (Originally 06/12/2022)   Zoster Vaccines- Shingrix (2 of 2) 10/25/2022 (Originally 05/28/2017)   COLONOSCOPY (Pts 45-8yrs Insurance coverage will need to be confirmed)  07/28/2023 (Originally 03/17/2022)   FOOT EXAM  12/07/2022   INFLUENZA VACCINE  01/10/2023   HEMOGLOBIN A1C  01/25/2023   Diabetic kidney evaluation - Urine ACR  07/28/2023   Diabetic kidney evaluation - eGFR measurement  09/18/2023   Medicare Annual Wellness (AWV)  10/11/2023   Pneumonia Vaccine 21+ Years old  Completed   Hepatitis C Screening  Completed   HIV Screening   Completed   HPV VACCINES  Aged Out    Health Maintenance  Health Maintenance Due  Topic Date Due   DTaP/Tdap/Td (2 - Tdap) 03/25/2019   OPHTHALMOLOGY EXAM  10/04/2022    Colorectal cancer screening: Referral to GI placed 10/11/2022. Pt aware the office will call re: appt.  Lung Cancer Screening: (Low Dose CT Chest recommended if Age 69-80 years, 30 pack-year currently smoking OR have quit w/in 15years.) does not qualify.   Lung Cancer Screening Referral: No  Additional Screening:  Hepatitis C Screening: does qualify; Completed: 06/17/2015  Vision Screening: Recommended annual ophthalmology exams for early detection of glaucoma and other disorders of the eye. Is the patient up to date with their annual eye exam?  Yes  Who is the provider or what is the name of the office in which the patient attends annual eye exams? Ernesto Rutherford, MD. If pt is not established with a provider, would they like to be referred to a provider to establish care? No .   Dental Screening: Recommended annual dental exams for proper oral hygiene  Community Resource Referral / Chronic Care Management: CRR required this visit?  No   CCM required this visit?  No      Plan:     I have personally reviewed and noted the following in the patient's chart:   Medical and social history Use of alcohol, tobacco or illicit drugs  Current medications and supplements including opioid prescriptions. Patient is not currently taking opioid prescriptions. Functional ability and status Nutritional status Physical activity Advanced directives List of other physicians Hospitalizations, surgeries, and ER visits in previous 12 months Vitals Screenings to include cognitive, depression, and falls Referrals and appointments  In addition, I have reviewed and discussed with patient certain preventive protocols, quality metrics, and best practice recommendations. A written personalized care plan for preventive services as  well as general preventive health recommendations were provided to patient.     Mickeal Needy, LPN   06/16/1094   Nurse Notes:  Normal cognitive status assessed by direct observation via telephone conversation by this Nurse Health Advisor. No abnormalities found.

## 2022-10-12 ENCOUNTER — Telehealth: Payer: Self-pay

## 2022-10-12 NOTE — Patient Outreach (Signed)
  Care Coordination   10/12/2022 Name: [name] [name] MRN: [id number] DOB: 24-May-1956   Care Coordination Outreach Attempts:  An unsuccessful telephone outreach was attempted for a scheduled appointment today.  Follow Up Plan:  Additional outreach attempts will be made to offer the patient care coordination information and services.   Encounter Outcome:  No Answer   Care Coordination Interventions:  No, not indicated    Kathyrn Sheriff, RN, MSN, BSN, CCM [facility] Care Coordinator [phone]

## 2022-10-16 ENCOUNTER — Other Ambulatory Visit (HOSPITAL_COMMUNITY): Payer: Self-pay | Admitting: Emergency Medicine

## 2022-10-16 NOTE — Progress Notes (Signed)
Paramedicine Encounter    Patient ID: [name] [name], male    DOB: 09/14/1955, 67 y.o.   MRN: [id number]   Complaints NONE  Assessment A&O x 4, skin W&D w/ good color.  Lung sounds clear throughout and no edema noted to his peripheral extremities.  Compliance with meds 100%  Pill box filled x 1 week filled by pt. Reviewed for accuracy.  He was missing his Zetia and this was added to the box.  Also he needs to pick up his refills for Pantoprazole, Paraxotene and Bidil.  The  first two meds will be added 1 tablet daily and the Bidil needs to be added to box Friday- Bedtime, Sunday & Monday-AM, Noon and Evening.  Pt. Advises he understands same.    Refills needed: Bidil, Paxil and Pantoprazole  Meds changes since last visit none    Social changes none   BP (!) 160/100 (BP Location: Left Arm, Patient Position: Sitting, Cuff Size: Large)   Pulse 65   Resp 16   Wt 288 lb (130.6 kg)   SpO2 96%   BMI 38.00 kg/m  Weight yesterday-not taken Last visit weight-295lb  Pt. Reports to be feeling well today.  He denies chest pain or SOB.  He filled his own pill box today and I reviewed for accuracy.  He was missing Zetia which I added to his regimen.  Also he has 3 refills that need to be added and he advised that he will go and pick them up later today and add to his box.  ACTION: Home visit completed  Bethanie Dicker [phone] 10/16/22  Patient Care Team: Corwin Levins, MD as PCP - General (Internal Medicine) Tonny Bollman, MD as PCP - Cardiology (Cardiology) Tonny Bollman, MD as Consulting Physician (Cardiology) Kennon Rounds as Physician Assistant (Cardiology) Quintella Reichert, MD as Consulting Physician (Sleep Medicine) Ellin Saba, [facility] (Inactive) as Pharmacist (Pharmacist)  Patient Active Problem List   Diagnosis Date Noted   UTI (urinary tract infection) 09/16/2022   Acute cystitis 09/15/2022   Generalized weakness 09/15/2022   High anion gap  metabolic acidosis 09/15/2022   Paroxysmal ventricular tachycardia (HCC) 08/11/2022   Rash 02/25/2022   Hypotension 02/16/2022   Near syncope 02/15/2022   Hearing loss of left ear due to cerumen impaction 12/15/2021   Asthma 09/17/2021   PVC's (premature ventricular contractions) 09/03/2021   SVT (supraventricular tachycardia) 09/03/2021   History of TIA (transient ischemic attack) 02/05/2021   History of stroke 01/2021   Aortic atherosclerosis (HCC) 12/26/2020   Cough 12/26/2020   Vitamin D deficiency 04/03/2020   B12 deficiency 04/03/2020   Itching 11/22/2019   Chronic heart failure with preserved ejection fraction (HFpEF) (HCC) 05/13/2019   Elevated troponin 05/13/2019   Morbid obesity with BMI of 40.0-44.9, adult (HCC) 05/13/2019   Stage 3a chronic kidney disease (HCC) 05/13/2019   Pre-ulcerative calluses 02/03/2019   Foot injury, left, initial encounter 11/26/2018   Chest pain 05/30/2017   Hypokalemia 05/30/2017   Allergic rhinitis 12/18/2016   Lumbar radiculitis 09/05/2016   Lumbar spinal stenosis 09/05/2016   Acute kidney injury superimposed on chronic kidney disease (HCC) 12/29/2015   Chest pain syndrome 12/15/2015   Acute renal failure superimposed on stage 3a chronic kidney disease (HCC) 12/15/2015   Atypical chest pain 12/15/2015   Encounter for well adult exam with abnormal findings 06/27/2015   Chronic low back pain 06/27/2015   Left lumbar radiculopathy 04/13/2015   Bilateral plantar fasciitis 04/13/2015   Essential  hypertension 03/09/2015   OSA (obstructive sleep apnea) 03/27/2014   Peripheral neuropathy (HCC) 12/29/2013   External hemorrhoid, thrombosed 12/03/2013   Sinus bradycardia 11/15/2013   Chronic combined systolic and diastolic heart failure (HCC) 11/14/2013   Coronary artery disease involving native coronary artery of native heart without angina pectoris    Hyperlipidemia    DM2 (diabetes mellitus, type 2) (HCC) 06/26/2013   Gait disorder  06/26/2013   Obesity, Class III, BMI 40-49.9 (morbid obesity) (HCC) 09/10/2012   Former smoker 09/02/2012   Ischemic cardiomyopathy- EF 50-55% June 2015    Non-ST elevation (NSTEMI) myocardial infarction (HCC) 09/01/2012   HEMORRHOIDS 12/26/2009   Acute prostatitis 12/26/2009   GANGLION CYST, WRIST, RIGHT 11/28/2009   COLONIC POLYPS 10/07/2009   Depression 07/21/2009   DYSPNEA 03/24/2009   GASTROESOPHAGEAL REFLUX DISEASE 01/27/2007   PERCUTANEOUS TRANSLUMINAL CORONARY ANGIOPLASTY, HX OF 02/09/2006    Current Outpatient Medications:    acetaminophen (TYLENOL) 500 MG tablet, Take 1,000 mg by mouth every 6 (six) hours as needed for moderate pain., Disp: , Rfl:    albuterol (VENTOLIN HFA) 108 (90 Base) MCG/ACT inhaler, Inhale 2 puffs into the lungs every 6 (six) hours as needed for wheezing or shortness of breath., Disp: 8 g, Rfl: 11   Alcohol Swabs (B-D SINGLE USE SWABS BUTTERFLY) PADS, Use up to 4 times a day to test blood sugars. Dx: E10.9, E11.9, Disp: 100 each, Rfl: 11   amiodarone (PACERONE) 200 MG tablet, Take 1 tablet (200 mg total) by mouth daily., Disp: 90 tablet, Rfl: 3   aspirin EC 81 MG tablet, Take 1 tablet (81 mg total) by mouth daily. Swallow whole., Disp: 90 tablet, Rfl: 3   atorvastatin (LIPITOR) 80 MG tablet, Take 1 tablet (80 mg total) by mouth every evening., Disp: 90 tablet, Rfl: 3   Blood Glucose Calibration (TRUE METRIX LEVEL 1) Low SOLN, Use as needed. Dx: E10.9, E11.9, Disp: 1 each, Rfl: 0   Blood Glucose Monitoring Suppl (ONETOUCH VERIO FLEX SYSTEM) DEVI, 1 Device by Does not apply route in the morning, at noon, in the evening, and at bedtime. E11.9, Disp: 1 each, Rfl: 0   carvedilol (COREG) 6.25 MG tablet, Take 1 tablet (6.25 mg total) by mouth 2 (two) times daily with a meal., Disp: 180 tablet, Rfl: 3   clopidogrel (PLAVIX) 75 MG tablet, Take 1 tablet (75 mg total) by mouth daily., Disp: 90 tablet, Rfl: 3   dorzolamide-timolol (COSOPT) 2-0.5 % ophthalmic solution,  Place 1 drop into both eyes See admin instructions. Instill one drop into both eyes twice in the morning and once at night, Disp: , Rfl:    ezetimibe (ZETIA) 10 MG tablet, Take 1 tablet (10 mg total) by mouth daily., Disp: 90 tablet, Rfl: 3   furosemide (LASIX) 40 MG tablet, Take 40 mg by mouth as needed for fluid or edema. As needed for 3lb weight gain, Disp: , Rfl:    gabapentin (NEURONTIN) 300 MG capsule, Take 300 mg by mouth daily., Disp: , Rfl:    glucose blood (ONETOUCH VERIO) test strip, Use as instructed four times per day E11.9, Disp: 400 each, Rfl: 12   isosorbide-hydrALAZINE (BIDIL) 20-37.5 MG tablet, Take 1 tablet by mouth 3 (three) times daily., Disp: 90 tablet, Rfl: 3   Lancets MISC, Use as directed four times per day E11.9, Disp: 400 each, Rfl: 3   mexiletine (MEXITIL) 250 MG capsule, Take 1 capsule (250 mg total) by mouth every 12 (twelve) hours., Disp: 180 capsule, Rfl: 3  nitroGLYCERIN (NITROSTAT) 0.4 MG SL tablet, Place 1 tablet (0.4 mg total) under the tongue every 5 (five) minutes as needed for chest pain. N, Disp: 25 tablet, Rfl: 3   sacubitril-valsartan (ENTRESTO) 97-103 MG, Take 1 tablet by mouth 2 (two) times daily., Disp: 60 tablet, Rfl: 5   spironolactone (ALDACTONE) 25 MG tablet, Take 1 tablet (25 mg total) by mouth daily., Disp: 90 tablet, Rfl: 3   tirzepatide (MOUNJARO) 2.5 MG/0.5ML Pen, Inject 2.5 mg into the skin once a week. E11.9, Disp: 2 mL, Rfl: 11   pantoprazole (PROTONIX) 40 MG tablet, Take 1 tablet (40 mg total) by mouth daily., Disp: 90 tablet, Rfl: 3   PARoxetine (PAXIL) 20 MG tablet, TAKE 1 TABLET(20 MG) BY MOUTH DAILY, Disp: 90 tablet, Rfl: 3 Allergies  Allergen Reactions   Penicillin G Other (See Comments)    Did it involve swelling of the face/tongue/throat, SOB, or low BP?  N/A Did it involve sudden or severe rash/hives, skin peeling, or any reaction on the inside of your mouth or nose? N/A Did you need to seek medical attention at a hospital or  doctor's office? N/A When did it last happen? Child If all above answers are "NO", may proceed with cephalosporin use.   Sglt2 Inhibitors Other (See Comments)    Hospn April 2024 with UTI, DKA   Penicillins Other (See Comments)    Did it involve swelling of the face/tongue/throat, SOB, or low BP? N/A Did it involve sudden or severe rash/hives, skin peeling, or any reaction on the inside of your mouth or nose?N/A Did you need to seek medical attention at a hospital or doctor's office? N/A When did it last happen? Child      If all above answers are "NO", may proceed with cephalosporin use.     Social History   Socioeconomic History   Marital status: Single    Spouse name: Not on file   Number of children: 0   Years of education: Not on file   Highest education level: 10th grade  Occupational History   Occupation: retired  Tobacco Use   Smoking status: Former    Packs/day: 0.50    Years: 30.00    Additional pack years: 0.00    Total pack years: 15.00    Types: Cigarettes   Smokeless tobacco: Never  Vaping Use   Vaping Use: Never used  Substance and Sexual Activity   Alcohol use: No   Drug use: No    Comment: remote marijuana use   Sexual activity: Not Currently  Other Topics Concern   Not on file  Social History Narrative   Lives alone.  He has no children.   He retired early due to health problems.         Social Determinants of Health   Financial Resource Strain: Low Risk  (10/11/2022)   Overall Financial Resource Strain (CARDIA)    Difficulty of Paying Living Expenses: Not hard at all  Food Insecurity: No Food Insecurity (10/11/2022)   Hunger Vital Sign    Worried About Running Out of Food in the Last Year: Never true    Ran Out of Food in the Last Year: Never true  Transportation Needs: No Transportation Needs (10/11/2022)   PRAPARE - Administrator, Civil Service (Medical): No    Lack of Transportation (Non-Medical): No  Recent Concern:  Transportation Needs - Unmet Transportation Needs (10/01/2022)   PRAPARE - Administrator, Civil Service (Medical): Yes  Lack of Transportation (Non-Medical): No  Physical Activity: Inactive (10/11/2022)   Exercise Vital Sign    Days of Exercise per Week: 0 days    Minutes of Exercise per Session: 0 min  Stress: No Stress Concern Present (10/11/2022)   [employer] of Occupational Health - Occupational Stress Questionnaire    Feeling of Stress : Not at all  Social Connections: Socially Isolated (10/11/2022)   Social Connection and Isolation Panel [NHANES]    Frequency of Communication with Friends and Family: More than three times a week    Frequency of Social Gatherings with Friends and Family: More than three times a week    Attends Religious Services: Never    Database administrator or Organizations: No    Attends Banker Meetings: Never    Marital Status: Never married  Intimate Partner Violence: Not At Risk (10/11/2022)   Humiliation, Afraid, Rape, and Kick questionnaire    Fear of Current or Ex-Partner: No    Emotionally Abused: No    Physically Abused: No    Sexually Abused: No    Physical Exam      Future Appointments  Date Time Provider Department Center  10/17/2022  8:00 PM Lennette Bihari, MD [facility]  11/12/2022 10:00 AM Helane Gunther, DPM [facility]  11/15/2022  7:05 AM [facility] DEVICE REMOTES [facility]  11/20/2022  2:45 PM Mealor, Roberts Gaudy, MD [facility]  01/25/2023  8:20 AM Corwin Levins, MD [facility] None  02/14/2023  7:05 AM [facility] DEVICE REMOTES [facility]  05/16/2023  7:05 AM [facility] DEVICE REMOTES [facility]  08/15/2023  7:05 AM [facility] DEVICE REMOTES [facility]  10/15/2023  1:45 PM [facility]-ANNUAL WELLNESS VISIT [facility] None  11/14/2023  7:05 AM [facility] DEVICE REMOTES [facility]  02/13/2024  7:05 AM [facility] DEVICE REMOTES  [facility]  05/14/2024  7:05 AM [facility] DEVICE REMOTES [facility]  08/13/2024  7:05 AM [facility] DEVICE REMOTES [facility]

## 2022-10-17 ENCOUNTER — Encounter (HOSPITAL_COMMUNITY): Payer: Self-pay

## 2022-10-17 ENCOUNTER — Ambulatory Visit (HOSPITAL_BASED_OUTPATIENT_CLINIC_OR_DEPARTMENT_OTHER): Payer: 59 | Attending: Cardiovascular Disease | Admitting: Cardiovascular Disease

## 2022-10-17 ENCOUNTER — Telehealth (HOSPITAL_COMMUNITY): Payer: Self-pay

## 2022-10-17 VITALS — Ht 73.0 in | Wt 288.0 lb

## 2022-10-17 DIAGNOSIS — G4733 Obstructive sleep apnea (adult) (pediatric): Secondary | ICD-10-CM | POA: Insufficient documentation

## 2022-10-17 DIAGNOSIS — I5042 Chronic combined systolic (congestive) and diastolic (congestive) heart failure: Secondary | ICD-10-CM | POA: Diagnosis not present

## 2022-10-17 NOTE — Telephone Encounter (Signed)
Attempted to call patient in regards to Cardiac Rehab - LM on VM Mailed letter 

## 2022-10-19 ENCOUNTER — Telehealth: Payer: Self-pay | Admitting: *Deleted

## 2022-10-19 NOTE — Progress Notes (Signed)
  Care Coordination Note  10/19/2022 Name: [name] [name] MRN: [id number] DOB: Jan 22, 1956  [name] [name] is a 68 y.o. year old male who is a primary care patient of Corwin Levins, MD and is actively engaged with the care management team. I reached out to [facility] by phone today to assist with re-scheduling a follow up visit with the RN Case Manager  Follow up plan: Unsuccessful telephone outreach attempt made. A HIPAA compliant phone message was left for the patient providing contact information and requesting a return call.   Burman Nieves, CCMA Care Coordination Care Guide Direct Dial: [phone]

## 2022-10-23 ENCOUNTER — Other Ambulatory Visit (HOSPITAL_COMMUNITY): Payer: Self-pay | Admitting: Emergency Medicine

## 2022-10-23 NOTE — Progress Notes (Signed)
Arrived at patient's residence and he was not home.  Waited outside residence for 15 minutes and made several phone call attempts w/o answer.  I will reach out to him tomorrow for follow up.    Beatrix Shipper, EMT-Paramedic [phone] 10/23/2022

## 2022-10-24 ENCOUNTER — Telehealth (HOSPITAL_COMMUNITY): Payer: Self-pay | Admitting: Emergency Medicine

## 2022-10-24 NOTE — Telephone Encounter (Signed)
Made mulitple phone calls on 10/23/22 & 10/24/22 w/ no answer.  LVM both days regarding visit and asking him to return my call.  Also went by his residence and he was not home yesterday or today.    Beatrix Shipper, EMT-Paramedic [phone] 10/24/2022

## 2022-10-30 ENCOUNTER — Telehealth (HOSPITAL_COMMUNITY): Payer: Self-pay | Admitting: Emergency Medicine

## 2022-10-30 ENCOUNTER — Telehealth (HOSPITAL_COMMUNITY): Payer: Self-pay

## 2022-10-30 NOTE — Telephone Encounter (Signed)
Called and spoke with Mr. Riley Lam who is listed as contact for Mr. Mandia.  He states that he spoke to Mr. Selvidge this morning.  I asked if he could reach out to Mr. Amaro and have him give me a call in order to reschedule a visit and he said he would do so.    Beatrix Shipper, EMT-Paramedic [phone] 10/30/2022

## 2022-10-30 NOTE — Telephone Encounter (Signed)
Pt insurance is active and benefits verified through [facility] Medicare Dual Complete. Co-pay $0.00, DED $240.00/$240.00 met, out of pocket $8,850.00/$4,230.92 met, co-insurance 20%. No pre-authorization required. Passport, 10/30/22 @ 10:30AM, REF#[id number]   How many CR sessions are covered? (36 sessions/visits for TCR, 72 sessions/visits for ICR)72 Is this a lifetime maximum or an annual maximum? Annual Has the member used any of these services to date? No Is there a time limit (weeks/months) on start of program and/or program completion? No

## 2022-10-30 NOTE — Telephone Encounter (Signed)
Called and LVM for Mr. Menser to return my call.  Have made several attempts to call and have gone by his house without successful contact.  Will continue to reach out.    Beatrix Shipper, EMT-Paramedic [phone] 10/30/2022

## 2022-10-30 NOTE — Telephone Encounter (Signed)
Pt returned CR phone call and stated he is interested in CR. Patient will come in for orientation on 11/06/22 @ 8:15AM and will attend the 8:15AM exercise class.   Pensions consultant.

## 2022-11-01 ENCOUNTER — Other Ambulatory Visit: Payer: Self-pay | Admitting: Physician Assistant

## 2022-11-02 ENCOUNTER — Telehealth (HOSPITAL_COMMUNITY): Payer: Self-pay | Admitting: Emergency Medicine

## 2022-11-02 ENCOUNTER — Telehealth (HOSPITAL_COMMUNITY): Payer: Self-pay

## 2022-11-02 NOTE — Procedures (Signed)
Patient Name: [name] [name], Craver Date: 10/17/2022 Gender: Male D.O.B: 24-Jun-1955 Age (years): 9 Referring Provider: Nicki Guadalajara MD, ABSM Height (inches): 73 Interpreting Physician: Nicki Guadalajara MD, ABSM Weight (lbs): 288 RPSGT: Ulyess Mort BMI: 38 MRN: [id number] Neck Size: 16.00  CLINICAL INFORMATION The patient is referred for a BiPAP titration to treat sleep apnea.  Date of NPSG: 08/16/2015: AHI 11.7; CPAP titrated to 11 cm with AHI 3.7/h                          09/03/2022: AHI 18.2/h with absent supine sleep  SLEEP STUDY TECHNIQUE As per the AASM Manual for the Scoring of Sleep and Associated Events v2.3 (April 2016) with a hypopnea requiring 4% desaturations.  The channels recorded and monitored were frontal, central and occipital EEG, electrooculogram (EOG), submentalis EMG (chin), nasal and oral airflow, thoracic and abdominal wall motion, anterior tibialis EMG, snore microphone, electrocardiogram, and pulse oximetry. Bilevel positive airway pressure (BPAP) was initiated at the beginning of the study and titrated to treat sleep-disordered breathing.  MEDICATIONS acetaminophen (TYLENOL) 500 MG tablet albuterol (VENTOLIN HFA) 108 (90 Base) MCG/ACT inhaler Alcohol Swabs (B-D SINGLE USE SWABS BUTTERFLY) PADS amiodarone (PACERONE) 200 MG tablet aspirin EC 81 MG tablet atorvastatin (LIPITOR) 80 MG tablet Blood Glucose Calibration (TRUE METRIX LEVEL 1) Low SOLN Blood Glucose Monitoring Suppl (ONETOUCH VERIO FLEX SYSTEM) DEVI carvedilol (COREG) 6.25 MG tablet clopidogrel (PLAVIX) 75 MG tablet dorzolamide-timolol (COSOPT) 2-0.5 % ophthalmic solution ezetimibe (ZETIA) 10 MG tablet furosemide (LASIX) 40 MG tablet gabapentin (NEURONTIN) 300 MG capsule glucose blood (ONETOUCH VERIO) test strip isosorbide-hydrALAZINE (BIDIL) 20-37.5 MG tablet Lancets MISC mexiletine (MEXITIL) 250 MG capsule nitroGLYCERIN (NITROSTAT) 0.4 MG SL tablet pantoprazole (PROTONIX)  40 MG tablet PARoxetine (PAXIL) 20 MG tablet sacubitril-valsartan (ENTRESTO) 97-103 MG spironolactone (ALDACTONE) 25 MG tablet tirzepatide (MOUNJARO) 2.5 MG/0.5ML Pen Medications self-administered by patient taken the night of the study : CARVEDILOL, CRESTOR, FAMOTIDINE, FUROSEMIDE, GABAPENTIN, LEXAPRO, TRAMADOL  RESPIRATORY PARAMETERS Optimal IPAP Pressure (cm): 25 AHI at Optimal Pressure (/hr) 0 Optimal EPAP Pressure (cm): 21   Overall Minimal O2 (%): 90.0 Minimal O2 at Optimal Pressure (%): 96.0  SLEEP ARCHITECTURE Start Time: 10:07:37 PM Stop Time: 4:44:36 AM Total Time (min): 397 Total Sleep Time (min): 314 Sleep Latency (min): 27.5 Sleep Efficiency (%): 79.1% REM Latency (min): 62.0 WASO (min): 55.5 Stage N1 (%): 6.1% Stage N2 (%): 59.9% Stage N3 (%): 0.0% Stage R (%): 34.1 Supine (%): 73.57 Arousal Index (/hr): 24.5   CARDIAC DATA The 2 lead EKG demonstrated sinus rhythm. The mean heart rate was 60.4 beats per minute. Other EKG findings include: PVCs.  LEG MOVEMENT DATA The total Periodic Limb Movements of Sleep (PLMS) were 0. The PLMS index was 0.0. A PLMS index of <15 is considered normal in adults.  IMPRESSIONS - CPAP was initiated at 7 cm, titrated to 19 cm and transitioned to BiPAP at 23/19 and titrated to optimal PAP pressure at 25/21 cm. (AHI 0, RDI 6.6/h; O2 nadir 96%) - Central sleep apnea was not noted during this titration (CAI 0.4/h). - Significant oxygen desaturations were not observed during this titration (min O2  90.0%). - The patient snored with soft snoring volume. - 2-lead EKG demonstrated: PVCs - Clinically significant periodic limb movements were not noted during this study. Arousals associated with PLMs were rare.  DIAGNOSIS - Obstructive Sleep Apnea (G47.33)  RECOMMENDATIONS - Recommend a trial of BiPAP Auto  therapy with EPAP min of 19, pressure support of 4, and IPAP max of 25 with heated humidification.  A large size Fisher&Paykel Full Face Simplus  mask was used for the titration. - Effort should be made to optimize nasal and oropharyngeal patency. - Avoid alcohol, sedatives and other CNS depressants that may worsen sleep apnea and disrupt normal sleep architecture. - Sleep hygiene should be reviewed to assess factors that may improve sleep quality. - Weight management (BMI 38) and regular exercise should be initiated or continued. - Recommend a download and sleep clinic evaluation after 4-6 weeks of therapy.  [Electronically signed] 11/02/2022 06:49 PM  Nicki Guadalajara MD, [facility], ABSM Diplomate, American Board of Sleep Medicine  NPI: [id number]  [facility] PH: [phone]   FX: [phone] ACCREDITED BY THE AMERICAN ACADEMY OF SLEEP MEDICINE

## 2022-11-02 NOTE — Telephone Encounter (Signed)
LVM to call cardiac rehab at [phone]. 

## 2022-11-02 NOTE — Telephone Encounter (Signed)
Spoke with Mr. Stahlberg this morning after multiple unsuccessful phone call attempts.  He advised he was doing well and has been busy.  He tells me that he is getting ready to do cardiac rehab and "I don't like to have a lot of things going on at one time."   Mr. Smethurst advises he feel comfortable doing his own meds.  He understands how to take his meds and call in refills.  He has his own transportation.  I asked if he felt he had a positive experience with the paramedicine program and he acknowledged that he did.  I advised him that I would be discharging him but  to feel free to reach out to me should he have further needs or questions and he said he would do so.    Beatrix Shipper, EMT-Paramedic [phone] 11/02/2022

## 2022-11-02 NOTE — Telephone Encounter (Signed)
Reviewed with patient the Cardiac Rehab Cardiac Risk [place] Nursing Assessment. Patient knows where our office is located, and to wear comfortable clothing/shoes. Recommended to patient to eat, take medications, and if diabetic, to check blood sugar before coming to classes. Explained orientation may take 2 or more hours. Patient stated he is "somewhat illiterate" and needs assistance completing forms. Patient has balance issues; recommended he bring his cane with him.

## 2022-11-06 ENCOUNTER — Encounter (HOSPITAL_COMMUNITY): Payer: 59

## 2022-11-06 ENCOUNTER — Telehealth (HOSPITAL_COMMUNITY): Payer: Self-pay

## 2022-11-06 ENCOUNTER — Other Ambulatory Visit: Payer: Self-pay | Admitting: Cardiovascular Disease

## 2022-11-06 ENCOUNTER — Telehealth: Payer: Self-pay

## 2022-11-06 NOTE — Telephone Encounter (Signed)
Notified patient of sleep results and and recommendations. Patient verbalized understanding. All questions (if any) were answered. BiPAP machine and supplies ordered today 11/06/22.

## 2022-11-06 NOTE — Telephone Encounter (Signed)
Patient is late for orientation. Called patient to see if he plans to come to orientation. He stated he ate something bad yesterday and has to stay near the bathroom. Advised him we will get him rescheduled. Scheduler advised.

## 2022-11-08 MED ORDER — FUROSEMIDE 40 MG PO TABS: 40.0000 mg | ORAL_TABLET | ORAL | 10 refills | Status: AC | PRN

## 2022-11-09 NOTE — Progress Notes (Signed)
  Care Coordination Note  11/09/2022 Name: [name] [name] MRN: [id number] DOB: 19-Mar-1956  [name] [name] is a 67 y.o. year old male who is a primary care patient of Corwin Levins, MD and is actively engaged with the care management team. I reached out to [facility] by phone today to assist with re-scheduling a follow up visit with the RN Case Manager  Follow up plan: Telephone appointment with care management team member scheduled for: 11/26/2022  Burman Nieves, [facility] Care Coordination Care Guide Direct Dial: [phone]

## 2022-11-12 ENCOUNTER — Ambulatory Visit (INDEPENDENT_AMBULATORY_CARE_PROVIDER_SITE_OTHER): Payer: 59 | Admitting: Podiatry

## 2022-11-12 ENCOUNTER — Encounter: Payer: Self-pay | Admitting: Podiatry

## 2022-11-12 DIAGNOSIS — M79675 Pain in left toe(s): Secondary | ICD-10-CM | POA: Diagnosis not present

## 2022-11-12 DIAGNOSIS — E1159 Type 2 diabetes mellitus with other circulatory complications: Secondary | ICD-10-CM

## 2022-11-12 DIAGNOSIS — M79674 Pain in right toe(s): Secondary | ICD-10-CM | POA: Diagnosis not present

## 2022-11-12 DIAGNOSIS — L84 Corns and callosities: Secondary | ICD-10-CM

## 2022-11-12 DIAGNOSIS — B351 Tinea unguium: Secondary | ICD-10-CM | POA: Diagnosis not present

## 2022-11-12 NOTE — Progress Notes (Signed)
This patient returns to my office for at risk foot care.  This patient requires this care by a professional since this patient will be at risk due to having pre-ulcerative calluses, CKD and diabetic neuropathy and coagulation defect.  Patient is taking plavix.  This patient is unable to cut nails himself since the patient cannot reach his nails.These nails are painful walking and wearing shoes.  This patient presents for at risk foot care today.    General Appearance  Alert, conversant and in no acute stress.  Vascular  Dorsalis pedis and posterior tibial  pulses are  weakly  palpable  bilaterally.  Capillary return is within normal limits  bilaterally. Temperature is within normal limits  bilaterally.  Neurologic  Senn-Weinstein monofilament wire test diminished   bilaterally. Muscle power within normal limits bilaterally.  Nails Thick disfigured discolored nails with subungual debris  from hallux to fifth toes bilaterally. No evidence of bacterial infection or drainage bilaterally.  Orthopedic  No limitations of motion  feet .  No crepitus or effusions noted.  No bony pathology or digital deformities noted.  Plantar flexed second metatarsal  B/L.  Skin  normotropic skin  noted bilaterally.  No signs of infections or ulcers noted.   Pre-ulcerous callus sub 2  B/L asymptomatic,  Onychomycosis  Pain in right toes  Pain in left toes  Porokeratosis/Pre-ulcerous callus  B/L.  Consent was obtained for treatment procedures.   Mechanical debridement of nails 1-5  bilaterally performed with a nail nipper.  Filed with dremel without incident.  Debride pre-ulcerous callus with # 15 blade.   Return office visit     10 weeks               Told patient to return for periodic foot care and evaluation due to potential at risk complications.   Helane Gunther DPM

## 2022-11-14 ENCOUNTER — Telehealth (HOSPITAL_COMMUNITY): Payer: Self-pay

## 2022-11-14 ENCOUNTER — Ambulatory Visit (HOSPITAL_COMMUNITY): Payer: 59

## 2022-11-14 NOTE — Telephone Encounter (Signed)
Called patient to see if he was interested in participating in the Cardiac Rehab Program. Patient stated yes. Patient will come in for orientation on 11/20/22 @ 8AM and will attend the 8:15AM exercise class.   Pensions consultant.

## 2022-11-15 ENCOUNTER — Ambulatory Visit (INDEPENDENT_AMBULATORY_CARE_PROVIDER_SITE_OTHER): Payer: 59

## 2022-11-15 DIAGNOSIS — I255 Ischemic cardiomyopathy: Secondary | ICD-10-CM

## 2022-11-15 LAB — CUP PACEART REMOTE DEVICE CHECK
Battery Remaining Longevity: 98 mo
Battery Remaining Percentage: 94 %
Battery Voltage: 3.04 V
Brady Statistic AP VP Percent: 32 %
Brady Statistic AP VS Percent: 1 %
Brady Statistic AS VP Percent: 67 %
Brady Statistic AS VS Percent: 1 %
Brady Statistic RA Percent Paced: 32 %
Date Time Interrogation Session: 20240606030007
HighPow Impedance: 73 Ohm
Implantable Lead Connection Status: 753985
Implantable Lead Connection Status: 753985
Implantable Lead Connection Status: 753985
Implantable Lead Implant Date: 20240305
Implantable Lead Implant Date: 20240305
Implantable Lead Implant Date: 20240305
Implantable Lead Location: 753858
Implantable Lead Location: 753859
Implantable Lead Location: 753860
Implantable Pulse Generator Implant Date: 20240305
Lead Channel Impedance Value: 1250 Ohm
Lead Channel Impedance Value: 430 Ohm
Lead Channel Impedance Value: 550 Ohm
Lead Channel Pacing Threshold Amplitude: 0.5 V
Lead Channel Pacing Threshold Amplitude: 0.625 V
Lead Channel Pacing Threshold Amplitude: 0.875 V
Lead Channel Pacing Threshold Pulse Width: 0.5 ms
Lead Channel Pacing Threshold Pulse Width: 0.5 ms
Lead Channel Pacing Threshold Pulse Width: 0.5 ms
Lead Channel Sensing Intrinsic Amplitude: 3.1 mV
Lead Channel Sensing Intrinsic Amplitude: 7.5 mV
Lead Channel Setting Pacing Amplitude: 1.5 V
Lead Channel Setting Pacing Amplitude: 1.625
Lead Channel Setting Pacing Amplitude: 1.875
Lead Channel Setting Pacing Pulse Width: 0.5 ms
Lead Channel Setting Pacing Pulse Width: 0.5 ms
Lead Channel Setting Sensing Sensitivity: 0.5 mV
Pulse Gen Serial Number: 810089146

## 2022-11-16 ENCOUNTER — Ambulatory Visit (HOSPITAL_COMMUNITY): Payer: 59

## 2022-11-19 ENCOUNTER — Ambulatory Visit (HOSPITAL_COMMUNITY): Payer: 59

## 2022-11-20 ENCOUNTER — Ambulatory Visit: Payer: 59 | Admitting: Cardiovascular Disease

## 2022-11-20 ENCOUNTER — Telehealth (HOSPITAL_COMMUNITY): Payer: Self-pay

## 2022-11-20 ENCOUNTER — Ambulatory Visit (HOSPITAL_COMMUNITY): Payer: 59

## 2022-11-20 NOTE — Telephone Encounter (Signed)
Patient did not show up for cardiac rehabilitation by 8:15. Spoke with patient over the phone. He stated, "I called yesterday to cancel my appointment and rescheduled it for July." Appointment cancelled for today.

## 2022-11-20 NOTE — Telephone Encounter (Signed)
Called and spoke with pt in regards to rescheduling for July, pt stated he is not sure when in July. He will contact CR when he is ready to reschedule.   Closed referral

## 2022-11-21 ENCOUNTER — Ambulatory Visit (HOSPITAL_COMMUNITY): Payer: 59

## 2022-11-21 ENCOUNTER — Ambulatory Visit (INDEPENDENT_AMBULATORY_CARE_PROVIDER_SITE_OTHER): Payer: 59 | Admitting: Internal Medicine

## 2022-11-21 VITALS — BP 118/80 | HR 64 | Temp 97.6°F | Ht 73.0 in | Wt 281.0 lb

## 2022-11-21 DIAGNOSIS — N1831 Chronic kidney disease, stage 3a: Secondary | ICD-10-CM

## 2022-11-21 DIAGNOSIS — E1159 Type 2 diabetes mellitus with other circulatory complications: Secondary | ICD-10-CM

## 2022-11-21 DIAGNOSIS — Z13220 Encounter for screening for lipoid disorders: Secondary | ICD-10-CM

## 2022-11-21 DIAGNOSIS — Z7985 Long-term (current) use of injectable non-insulin antidiabetic drugs: Secondary | ICD-10-CM | POA: Diagnosis not present

## 2022-11-21 DIAGNOSIS — K5900 Constipation, unspecified: Secondary | ICD-10-CM | POA: Diagnosis not present

## 2022-11-21 DIAGNOSIS — E538 Deficiency of other specified B group vitamins: Secondary | ICD-10-CM

## 2022-11-21 DIAGNOSIS — J4521 Mild intermittent asthma with (acute) exacerbation: Secondary | ICD-10-CM | POA: Diagnosis not present

## 2022-11-21 DIAGNOSIS — I5042 Chronic combined systolic (congestive) and diastolic (congestive) heart failure: Secondary | ICD-10-CM

## 2022-11-21 DIAGNOSIS — E559 Vitamin D deficiency, unspecified: Secondary | ICD-10-CM | POA: Diagnosis not present

## 2022-11-21 LAB — CBC WITH DIFFERENTIAL/PLATELET
Basophils Absolute: 0 10*3/uL (ref 0.0–0.1)
Basophils Relative: 0.4 % (ref 0.0–3.0)
Eosinophils Absolute: 0.1 10*3/uL (ref 0.0–0.7)
Eosinophils Relative: 2.8 % (ref 0.0–5.0)
HCT: 42.4 % (ref 39.0–52.0)
Hemoglobin: 13.8 g/dL (ref 13.0–17.0)
Lymphocytes Relative: 48 % — ABNORMAL HIGH (ref 12.0–46.0)
Lymphs Abs: 2 10*3/uL (ref 0.7–4.0)
MCHC: 32.6 g/dL (ref 30.0–36.0)
MCV: 87.2 fl (ref 78.0–100.0)
Monocytes Absolute: 0.4 10*3/uL (ref 0.1–1.0)
Monocytes Relative: 10.4 % (ref 3.0–12.0)
Neutro Abs: 1.6 10*3/uL (ref 1.4–7.7)
Neutrophils Relative %: 38.4 % — ABNORMAL LOW (ref 43.0–77.0)
Platelets: 195 10*3/uL (ref 150.0–400.0)
RBC: 4.87 Mil/uL (ref 4.22–5.81)
RDW: 16 % — ABNORMAL HIGH (ref 11.5–15.5)
WBC: 4.3 10*3/uL (ref 4.0–10.5)

## 2022-11-21 LAB — BASIC METABOLIC PANEL
BUN: 19 mg/dL (ref 6–23)
CO2: 21 mEq/L (ref 19–32)
Calcium: 9.7 mg/dL (ref 8.4–10.5)
Chloride: 111 mEq/L (ref 96–112)
Creatinine, Ser: 1.51 mg/dL — ABNORMAL HIGH (ref 0.40–1.50)
GFR: 48.08 mL/min — ABNORMAL LOW (ref >60.00)
Glucose, Bld: 107 mg/dL — ABNORMAL HIGH (ref 70–99)
Potassium: 4 mEq/L (ref 3.5–5.1)
Sodium: 139 mEq/L (ref 135–145)

## 2022-11-21 LAB — LIPID PANEL
Cholesterol: 95 mg/dL (ref 0–200)
HDL: 49.2 mg/dL (ref >39.00)
LDL Cholesterol: 35 mg/dL (ref 0–99)
NonHDL: 45.93
Total CHOL/HDL Ratio: 2
Triglycerides: 53 mg/dL (ref 0.0–149.0)
VLDL: 10.6 mg/dL (ref 0.0–40.0)

## 2022-11-21 LAB — VITAMIN D 25 HYDROXY (VIT D DEFICIENCY, FRACTURES): VITD: 38.92 ng/mL (ref 30.00–100.00)

## 2022-11-21 LAB — HEPATIC FUNCTION PANEL
ALT: 16 U/L (ref 0–53)
AST: 17 U/L (ref 0–37)
Albumin: 4 g/dL (ref 3.5–5.2)
Alkaline Phosphatase: 58 U/L (ref 39–117)
Bilirubin, Direct: 0.1 mg/dL (ref 0.0–0.3)
Total Bilirubin: 0.5 mg/dL (ref 0.2–1.2)
Total Protein: 7.1 g/dL (ref 6.0–8.3)

## 2022-11-21 LAB — HEMOGLOBIN A1C: Hgb A1c MFr Bld: 6.2 % (ref 4.6–6.5)

## 2022-11-21 LAB — VITAMIN B12: Vitamin B-12: 299 pg/mL (ref 211–911)

## 2022-11-21 MED ORDER — FLUTICASONE PROPIONATE HFA 110 MCG/ACT IN AERO: 2.0000 | INHALATION_SPRAY | Freq: Two times a day (BID) | RESPIRATORY_TRACT | 12 refills | Status: AC

## 2022-11-21 MED ORDER — ALBUTEROL SULFATE HFA 108 (90 BASE) MCG/ACT IN AERS: 2.0000 | INHALATION_SPRAY | Freq: Four times a day (QID) | RESPIRATORY_TRACT | 11 refills | Status: DC | PRN

## 2022-11-21 NOTE — Progress Notes (Signed)
Patient ID: [name] [name], male   DOB: January 30, 1956, 67 y.o.   MRN: [id number]        Chief Complaint: follow up right knee pain and swelling, asthma uncontrolled, ckd3a, dm, chf, constipation       HPI:  [name] [name] is a 67 y.o. male here with c/o bending to work on something below waist level, and simply tipped over lost balance and hit moderately hard on the ground with right knee x 3 days, now with mild redness, swelling and stiffness worse in the am, maybe somewhat better today.  Pt denies chest pain, orthopnea, PND, increased LE swelling, palpitations, dizziness or syncope, though in recent months has 2 mo uncontrolled non prod cough, sob wheeziness better but using rescue inhaler nearly every day for relief. Also Has had more straining at stool recently due to contipation, no blood, using anusol hc more for the concsipation aggrevation as well     Peak wt has been 316, now down to 281 Wt Readings from Last 3 Encounters:  11/21/22 281 lb (127.5 kg)  10/17/22 288 lb (130.6 kg)  10/16/22 288 lb (130.6 kg)   BP Readings from Last 3 Encounters:  11/21/22 118/80  10/16/22 (!) 160/100  10/02/22 (!) 160/100         Past Medical History:  Diagnosis Date   CAD (coronary artery disease)    a. s/p BMS pRCA 2002; b. DES to pRCA & DES p/m RCA 2006; c. PCI/DES OM4 2007; d. PCI/CBA to RCA for ISR 10/2006; e.08/2012 STEMI/Cath/PCI:  LAD 95% >> PCI: Promus Prem DES  //  f. NSTEMI 10/15 >> LHC: mLAD stent ok, dLAD 70, OM1 CTO, OM2 stent ok, dOM2 90, OM3 30-40, dLCx 90, p-mRCA stent ok, mRCA stent ok w/ 60-70 ISR, EF 50% >> med Rx // MV 1/17: no ischemia // Cath ([facility]) 05/2019: RCA 100 (CTO)    Combined systolic and diastolic CHF    a. Myoview 1/17: EF 35%  //  b. Echo 1/17 EF 45-50% // Echo 05/2019: EF 50-55 // Echocardiogram 8/21: EF 50, inf AK, post and mid inf HK, mod LVH, RVSP 25.3, mild to mod LAE, trivial MR    Depression    Diabetes mellitus (HCC)    a. A1c 8.8 08/2012->Metformin initiated. => b. A1c  (9/14): 6.6   Gastroesophageal reflux disease    History of nuclear stress test    a. Myoview 1/17: EF 35%, fixed inferior lateral defect consistent with infarct, no ischemia, intermediate risk   History of pneumonia    History of stroke 01/2021   Zio Monitor 11/22: NSR, avg HR 61; PVCs (1.9%); no AFib, no high grade HB, no ventricular arrhythmias   HTN (hypertension)    Hx of NSTEMI    Hyperlipidemia    Ischemic cardiomyopathy    a. EF 40%; improved to normal;  b. 08/2012 Echo: EF 50-55%, mod LVH.//  c. Echo 9/16: Inf HK, mild LVH, EF 55%, mild LAE, normal RVF, mild RAE, PASP 35 mmHg  //  d. Echo 1/17: EF 45-50%, inferior HK, mild BAE, PASP 33 mmHg   Obesity    OSA (obstructive sleep apnea)    Does not use CPAP as of 05/2011   Tobacco abuse    Past Surgical History:  Procedure Laterality Date   BIV ICD INSERTION CRT-D N/A 08/14/2022   Procedure: BIV ICD INSERTION CRT-D;  Surgeon: Maurice Small, MD;  Location: [facility] INVASIVE CV LAB;  Service: Cardiovascular;  Laterality: N/A;   CORONARY  ANGIOPLASTY WITH STENT PLACEMENT     CORONARY STENT PLACEMENT  2009   LEFT HEART CATH AND CORONARY ANGIOGRAPHY N/A 08/13/2022   Procedure: LEFT HEART CATH AND CORONARY ANGIOGRAPHY;  Surgeon: Yvonne Kendall, MD;  Location: [facility] INVASIVE CV LAB;  Service: Cardiovascular;  Laterality: N/A;   LEFT HEART CATHETERIZATION WITH CORONARY ANGIOGRAM N/A 08/31/2012   Procedure: LEFT HEART CATHETERIZATION WITH CORONARY ANGIOGRAM;  Surgeon: Kathleene Hazel, MD;  Location: [facility] CATH LAB;  Service: Cardiovascular;  Laterality: N/A;   LEFT HEART CATHETERIZATION WITH CORONARY ANGIOGRAM N/A 11/16/2013   Procedure: LEFT HEART CATHETERIZATION WITH CORONARY ANGIOGRAM;  Surgeon: Micheline Chapman, MD;  Location: [facility] CATH LAB;  Service: Cardiovascular;  Laterality: N/A;   LEFT HEART CATHETERIZATION WITH CORONARY ANGIOGRAM N/A 03/15/2014   Procedure: LEFT HEART CATHETERIZATION WITH CORONARY ANGIOGRAM;  Surgeon: Peter M Swaziland, MD;   Location: [facility] CATH LAB;  Service: Cardiovascular;  Laterality: N/A;   PERCUTANEOUS CORONARY STENT INTERVENTION (PCI-S) Right 08/31/2012   Procedure: PERCUTANEOUS CORONARY STENT INTERVENTION (PCI-S);  Surgeon: Kathleene Hazel, MD;  Location: [facility] CATH LAB;  Service: Cardiovascular;  Laterality: Right;   PERCUTANEOUS CORONARY STENT INTERVENTION (PCI-S)  11/16/2013   Procedure: PERCUTANEOUS CORONARY STENT INTERVENTION (PCI-S);  Surgeon: Micheline Chapman, MD;  Location: [facility] CATH LAB;  Service: Cardiovascular;;    reports that he has quit smoking. His smoking use included cigarettes. He has a 15.00 pack-year smoking history. He has never used smokeless tobacco. He reports that he does not drink alcohol and does not use drugs. family history includes Cancer in his mother; Coronary artery disease in an other family member; Depression in his mother; Hypertension in his mother; Other in his father. Allergies  Allergen Reactions   Penicillin G Other (See Comments)    Did it involve swelling of the face/tongue/throat, SOB, or low BP?  N/A Did it involve sudden or severe rash/hives, skin peeling, or any reaction on the inside of your mouth or nose? N/A Did you need to seek medical attention at a hospital or doctor's office? N/A When did it last happen? Child If all above answers are "NO", may proceed with cephalosporin use.   Sglt2 Inhibitors Other (See Comments)    Hospn April 2024 with UTI, DKA   Penicillins Other (See Comments)    Did it involve swelling of the face/tongue/throat, SOB, or low BP? N/A Did it involve sudden or severe rash/hives, skin peeling, or any reaction on the inside of your mouth or nose?N/A Did you need to seek medical attention at a hospital or doctor's office? N/A When did it last happen? Child      If all above answers are "NO", may proceed with cephalosporin use.   Current Outpatient Medications on File Prior to Visit  Medication Sig Dispense Refill   acetaminophen  (TYLENOL) 500 MG tablet Take 1,000 mg by mouth every 6 (six) hours as needed for moderate pain.     Alcohol Swabs (B-D SINGLE USE SWABS BUTTERFLY) PADS Use up to 4 times a day to test blood sugars. Dx: E10.9, E11.9 100 each 11   amiodarone (PACERONE) 200 MG tablet Take 1 tablet (200 mg total) by mouth daily. 90 tablet 3   aspirin EC 81 MG tablet Take 1 tablet (81 mg total) by mouth daily. Swallow whole. 90 tablet 3   atorvastatin (LIPITOR) 80 MG tablet Take 1 tablet (80 mg total) by mouth every evening. 90 tablet 3   Blood Glucose Calibration (TRUE METRIX LEVEL 1) Low SOLN Use  as needed. Dx: E10.9, E11.9 1 each 0   Blood Glucose Monitoring Suppl (ONETOUCH VERIO FLEX SYSTEM) DEVI 1 Device by Does not apply route in the morning, at noon, in the evening, and at bedtime. E11.9 1 each 0   carvedilol (COREG) 6.25 MG tablet Take 1 tablet (6.25 mg total) by mouth 2 (two) times daily with a meal. 180 tablet 3   clopidogrel (PLAVIX) 75 MG tablet Take 1 tablet (75 mg total) by mouth daily. 90 tablet 3   dorzolamide-timolol (COSOPT) 2-0.5 % ophthalmic solution Place 1 drop into both eyes See admin instructions. Instill one drop into both eyes twice in the morning and once at night     ezetimibe (ZETIA) 10 MG tablet Take 1 tablet (10 mg total) by mouth daily. 90 tablet 3   furosemide (LASIX) 40 MG tablet Take 1 tablet (40 mg total) by mouth as needed for fluid or edema. As needed for 3lb weight gain 30 tablet 10   gabapentin (NEURONTIN) 300 MG capsule Take 300 mg by mouth daily.     glucose blood (ONETOUCH VERIO) test strip Use as instructed four times per day E11.9 400 each 12   isosorbide-hydrALAZINE (BIDIL) 20-37.5 MG tablet Take 1 tablet by mouth 3 (three) times daily. 90 tablet 3   Lancets MISC Use as directed four times per day E11.9 400 each 3   mexiletine (MEXITIL) 250 MG capsule Take 1 capsule (250 mg total) by mouth every 12 (twelve) hours. 180 capsule 3   nitroGLYCERIN (NITROSTAT) 0.4 MG SL tablet  Place 1 tablet (0.4 mg total) under the tongue every 5 (five) minutes as needed for chest pain. N 25 tablet 3   pantoprazole (PROTONIX) 40 MG tablet Take 1 tablet (40 mg total) by mouth daily. 90 tablet 3   PARoxetine (PAXIL) 20 MG tablet TAKE 1 TABLET(20 MG) BY MOUTH DAILY 90 tablet 3   sacubitril-valsartan (ENTRESTO) 97-103 MG Take 1 tablet by mouth 2 (two) times daily. 60 tablet 5   spironolactone (ALDACTONE) 25 MG tablet Take 1 tablet (25 mg total) by mouth daily. 90 tablet 3   tirzepatide (MOUNJARO) 2.5 MG/0.5ML Pen Inject 2.5 mg into the skin once a week. E11.9 2 mL 11   No current facility-administered medications on file prior to visit.        ROS:  All others reviewed and negative.  Objective        PE:  BP 118/80 (BP Location: Left Arm, Patient Position: Sitting, Cuff Size: Large)   Pulse 64   Temp 97.6 F (36.4 C) (Temporal)   Ht 6\' 1"  (1.854 m)   Wt 281 lb (127.5 kg)   SpO2 97%   BMI 37.07 kg/m                 Constitutional: Pt appears in NAD               HENT: Head: NCAT.                Right Ear: External ear normal.                 Left Ear: External ear normal.                Eyes: . Pupils are equal, round, and reactive to light. Conjunctivae and EOM are normal               Nose: without d/c or deformity  Neck: Neck supple. Gross normal ROM               Cardiovascular: Normal rate and regular rhythm.                 Pulmonary/Chest: Effort normal and breath sounds without rales or wheezing.                Abd:  Soft, NT, ND, + BS, no organomegaly               Neurological: Pt is alert. At baseline orientation, motor grossly intact               Skin: Skin is warm. No rashes, no other new lesions, LE edema - none; riht knee with trace swelling and diffuse mild tender               Psychiatric: Pt behavior is normal without agitation   Micro: none  Cardiac tracings I have personally interpreted today:  none  Pertinent Radiological findings  (summarize): none   Lab Results  Component Value Date   WBC 4.3 11/21/2022   HGB 13.8 11/21/2022   HCT 42.4 11/21/2022   PLT 195.0 11/21/2022   GLUCOSE 107 (H) 11/21/2022   CHOL 95 11/21/2022   TRIG 53.0 11/21/2022   HDL 49.20 11/21/2022   LDLCALC 35 11/21/2022   ALT 16 11/21/2022   AST 17 11/21/2022   NA 139 11/21/2022   K 4.0 11/21/2022   CL 111 11/21/2022   CREATININE 1.51 (H) 11/21/2022   BUN 19 11/21/2022   CO2 21 11/21/2022   TSH 0.924 09/15/2022   PSA 1.40 07/27/2022   INR 1.0 09/15/2022   HGBA1C 6.2 11/21/2022   MICROALBUR 2.9 (H) 07/27/2022   Assessment/Plan:  Noelle Doto is a 67 y.o. Black or African American [2] male with  has a past medical history of CAD (coronary artery disease), Combined systolic and diastolic CHF, Depression, Diabetes mellitus (HCC), Gastroesophageal reflux disease, History of nuclear stress test, History of pneumonia, History of stroke (01/2021), HTN (hypertension), Hx of NSTEMI, Hyperlipidemia, Ischemic cardiomyopathy, Obesity, OSA (obstructive sleep apnea), and Tobacco abuse.  Chronic combined systolic and diastolic heart failure (HCC) Stable overall , cont current med tx  DM2 (diabetes mellitus, type 2) (HCC) Lab Results  Component Value Date   HGBA1C 6.2 11/21/2022   Stable, pt to continue current medical treatment mounjaro 2.5 mg weekly   Vitamin D deficiency Last vitamin D Lab Results  Component Value Date   VD25OH 38.92 11/21/2022   Low, to start oral replacement   B12 deficiency Lab Results  Component Value Date   VITAMINB12 299 11/21/2022   Low, to start oral replacement - b12 1000 mcg qd   Stage 3a chronic kidney disease (HCC) Lab Results  Component Value Date   CREATININE 1.51 (H) 11/21/2022   Stable overall, cont to avoid nephrotoxins   Constipation ? Related to mounjaro use, for add colace 100 bid  Asthma Mild uncontrolled, to add flovent inhaler prn  Followup: Return in about 6 months (around  05/23/2023).  Oliver Barre, MD 11/24/2022 12:40 PM [facility]

## 2022-11-21 NOTE — Progress Notes (Signed)
The test results show that your current treatment is OK, as the tests are stable.  Please continue the same plan.  There is no other need for change of treatment or further evaluation based on these results, at this time.  thanks 

## 2022-11-21 NOTE — Patient Instructions (Signed)
Please take all new medication as prescribed - the flovent inhaler for asthma  Ok to add OTC Colace 100 mg twice per day as needed for stool softening in addition to the Miralax daily  Ok to take the tylenol as needed for pain, and avoid advil and alleve type medications  Please continue all other medications as before, and refills have been done if requested.  Please have the pharmacy call with any other refills you may need.  Please continue your efforts at being more active, low cholesterol diet, and weight control.  Please keep your appointments with your specialists as you may have planned  Please go to the LAB at the blood drawing area for the tests to be done  You will be contacted by phone if any changes need to be made immediately.  Otherwise, you will receive a letter about your results with an explanation, but please check with MyChart first.  Please remember to sign up for MyChart if you have not done so, as this will be important to you in the future with finding out test results, communicating by private email, and scheduling acute appointments online when needed.  Please make an Appointment to return in 6 months, or sooner if needed

## 2022-11-23 ENCOUNTER — Ambulatory Visit (HOSPITAL_COMMUNITY): Payer: 59

## 2022-11-24 ENCOUNTER — Encounter: Payer: Self-pay | Admitting: Internal Medicine

## 2022-11-24 DIAGNOSIS — K5900 Constipation, unspecified: Secondary | ICD-10-CM | POA: Insufficient documentation

## 2022-11-24 NOTE — Assessment & Plan Note (Signed)
?   Related to mounjaro use, for add colace 100 bid

## 2022-11-24 NOTE — Assessment & Plan Note (Signed)
Mild uncontrolled, to add flovent inhaler prn

## 2022-11-24 NOTE — Assessment & Plan Note (Signed)
Last vitamin D Lab Results  Component Value Date   VD25OH 38.92 11/21/2022   Low, to start oral replacement

## 2022-11-24 NOTE — Assessment & Plan Note (Signed)
Lab Results  Component Value Date   CREATININE 1.51 (H) 11/21/2022   Stable overall, cont to avoid nephrotoxins

## 2022-11-24 NOTE — Assessment & Plan Note (Signed)
Lab Results  Component Value Date   HGBA1C 6.2 11/21/2022   Stable, pt to continue current medical treatment mounjaro 2.5 mg weekly

## 2022-11-24 NOTE — Assessment & Plan Note (Signed)
Lab Results  Component Value Date   VITAMINB12 299 11/21/2022   Low, to start oral replacement - b12 1000 mcg qd

## 2022-11-24 NOTE — Assessment & Plan Note (Signed)
Stable overall, cont current med tx 

## 2022-11-26 ENCOUNTER — Ambulatory Visit (HOSPITAL_COMMUNITY): Payer: 59

## 2022-11-27 ENCOUNTER — Ambulatory Visit: Payer: Self-pay

## 2022-11-27 NOTE — Patient Instructions (Signed)
Visit Information  Thank you for taking time to visit with me today. Please don't hesitate to contact me if I can be of assistance to you.   Following are the goals we discussed today:  Continue to take medications as prescribed Continue to attend provider visits as scheduled Continue to weigh self daily. Notify cardiologist if any signs/symptoms of HF exacerbation: weight gain 3 pounds in 24 hour period or 5 pounds in a week. Increased swelling of feet/ankles/stomach/hands; increased Shortness of breath, hard to breath laying down and you have to sit up to breath or increase in number of pillows needed, increased fatigue; you feel something is not right or any questions or concerns. Continue to check blood sugars as prescribed and notify provider if outside recommended range   Our next appointment is by telephone on 12/19/22 at 2:00 pm  Please call the care guide team at [phone] if you need to cancel or reschedule your appointment.   If you are experiencing a Mental Health or Behavioral Health Crisis or need someone to talk to, please call the Suicide and Crisis Lifeline: 26  Kathyrn Sheriff, RN, MSN, BSN, CCM [facility] Care Coordinator [phone]

## 2022-11-27 NOTE — Patient Outreach (Signed)
  Care Coordination   Follow Up Visit Note   11/27/2022 Name: [name] [name] MRN: [id number] DOB: 1956-05-13  [name] [name] is a 67 y.o. year old male who sees [name] Levins, MD for primary care. I spoke with  [name] [name] by phone today.  What matters to the patients health and wellness today?  [name] [name] reports he is doing better. He reports he continues to weigh self daily and denies any signs/symptoms of HF exacerbation. Reports constipation has been a problem, but states is better. He states he is using a laxative. Last office visit with PCP 11/21/22. He states he has not started colace recommended by PCP because of listed expense when he did an Child psychotherapist.  Goals Addressed             This Visit's Progress    continue to improve post hospitalization       Interventions Today    Flowsheet Row Most Recent Value  Chronic Disease   Chronic disease during today's visit Congestive Heart Failure (CHF), Other, Hypertension (HTN), Chronic Kidney Disease/End Stage Renal Disease (ESRD)  General Interventions   General Interventions Discussed/Reviewed General Interventions Reviewed, Durable Medical Equipment (DME)  Durable Medical Equipment (DME) Glucomoter, BP Cuff, Other  [scales]  Exercise Interventions   Exercise Discussed/Reviewed Physical Activity  Physical Activity Discussed/Reviewed Physical Activity Reviewed  [encouraged to remain as active as tolerated. advised to discuss cardiac rehab at next cardiology visit regarding re-referral]  Education Interventions   Education Provided Provided Education  Provided Verbal Education On When to see the doctor, Blood Sugar Monitoring, Other  [advised to continue to check blood sugar as recommended. discussed target blood sugar range 80-130 or per provider recommendation. reviewed signs/symptoms of HF exacerbation.]  Pharmacy Interventions   Pharmacy Dicussed/Reviewed Pharmacy Topics Reviewed  Safety Interventions   Safety  Discussed/Reviewed Fall Risk, Safety Reviewed            SDOH assessments and interventions completed:  Yes reviewed with patient-no changes.  SDOH Interventions Today    Flowsheet Row Most Recent Value  SDOH Interventions   Transportation Interventions Intervention Not Indicated     Care Coordination Interventions:  Yes, provided   Follow up plan: Follow up call scheduled for 12/19/22    Encounter Outcome:  Pt. Visit Completed   Kathyrn Sheriff, RN, MSN, BSN, CCM [facility] Care Coordinator [phone]

## 2022-11-28 ENCOUNTER — Ambulatory Visit (HOSPITAL_COMMUNITY): Payer: 59

## 2022-11-30 ENCOUNTER — Ambulatory Visit (HOSPITAL_COMMUNITY): Payer: 59

## 2022-12-03 ENCOUNTER — Ambulatory Visit (HOSPITAL_COMMUNITY): Payer: 59

## 2022-12-05 ENCOUNTER — Ambulatory Visit (HOSPITAL_COMMUNITY): Payer: 59

## 2022-12-05 NOTE — Progress Notes (Signed)
Remote ICD transmission.   

## 2022-12-07 ENCOUNTER — Ambulatory Visit (HOSPITAL_COMMUNITY): Payer: 59

## 2022-12-10 ENCOUNTER — Ambulatory Visit (HOSPITAL_COMMUNITY): Payer: 59

## 2022-12-12 ENCOUNTER — Ambulatory Visit (HOSPITAL_COMMUNITY): Payer: 59

## 2022-12-14 ENCOUNTER — Ambulatory Visit (HOSPITAL_COMMUNITY): Payer: 59

## 2022-12-17 ENCOUNTER — Ambulatory Visit (HOSPITAL_COMMUNITY): Payer: 59

## 2022-12-18 ENCOUNTER — Ambulatory Visit: Payer: 59 | Attending: Cardiovascular Disease | Admitting: Cardiovascular Disease

## 2022-12-19 ENCOUNTER — Encounter: Payer: Self-pay | Admitting: Cardiovascular Disease

## 2022-12-19 ENCOUNTER — Ambulatory Visit (HOSPITAL_COMMUNITY): Payer: 59

## 2022-12-19 ENCOUNTER — Ambulatory Visit: Payer: Self-pay

## 2022-12-19 NOTE — Patient Outreach (Signed)
  Care Coordination   Follow Up Visit Note   12/19/2022 Name: [name] [name] MRN: [id number] DOB: 1956-02-07  [name] [name] is a 67 y.o. year old male who sees [name] Levins, MD for primary care. I spoke with  [name] [name] by phone today.  What matters to the patients health and wellness today?  [name] [name] reports he continues to weight and record weights. He was not aware that he missed cardiology appointment on yesterday. He states he will call to reschedule. [name] [name] reports his blood sugars have been 70's-100's. He denies any concerns at this time.  Goals Addressed             This Visit's Progress    continue to improve post hospitalization       Interventions Today    Flowsheet Row Most Recent Value  Chronic Disease   Chronic disease during today's visit Diabetes, Congestive Heart Failure (CHF)  General Interventions   General Interventions Discussed/Reviewed General Interventions Reviewed, Doctor Visits  Doctor Visits Discussed/Reviewed PCP, Specialist  [Reviewed upcoming/scheduled appointments with patient. Provided contact number for cardiologist and encouraged to call to reschedule appointment]  PCP/Specialist Visits Compliance with follow-up visit  [provided contact number for cardiology office and encouraged to reschedule office visit]  Education Interventions   Education Provided Provided Education, Provided Printed Education  [Encouraged to notify provider if blood sugars <70. reviewed signs/symptoms of hypoglycemia. reviewed rule of 15 for treatment of hypoglycemia. emmi article provided via mail diabetes and diet,  low blood sugar in people with diabetes,  the ABCS of diabetes]  Provided Verbal Education On Blood Sugar Monitoring, When to see the doctor, Other  [discussed daily weights and how to way. reviewed parameters for when to call doctor and per prn furosemide parameters. reviewed blood sugars with patient]  Nutrition Interventions   Nutrition Discussed/Reviewed  Nutrition Reviewed  [advised not to go too long between meals, advised to try eating a snack at night related to waking up with blood sugar in the 70's.]  Pharmacy Interventions   Pharmacy Dicussed/Reviewed Pharmacy Topics Reviewed            SDOH assessments and interventions completed:  No  Care Coordination Interventions:  Yes, provided   Follow up plan: Follow up call scheduled for 01/28/23    Encounter Outcome:  Pt. Visit Completed   Kathyrn Sheriff, RN, MSN, BSN, CCM [facility] Care Coordinator [phone]

## 2022-12-19 NOTE — Patient Instructions (Addendum)
Visit Information  Thank you for taking time to visit with me today. Please don't hesitate to contact me if I can be of assistance to you.   Following are the goals we discussed today:  Call to reschedule Cardiologist office visit at [phone]   Continue to take medications as prescribed Continue to attend provider visits as scheduled Contact your provider with health questions or concerns as needed Continue to check blood sugars as recommended by your provider and notify provider if outside recommended range. Continue to weigh self daily. Notify cardiologist if any signs/symptoms of HF exacerbation: weight gain 3 pounds in 24 hour period or 5 pounds in a week. Increased swelling of feet/ankles/stomach/hands; increased Shortness of breath, hard to breath laying down and you have to sit up to breath or increase in number of pillows needed, increased fatigue; you feel something is not right or any questions or concerns. Continue to eat healthy: leans meats, fruits/vegetables, monitor sodium intake, avoid saturated fats and transfats,   Our next appointment is by telephone on 01/28/23 at 2:00 pm  Please call the care guide team at [phone] if you need to cancel or reschedule your appointment.   If you are experiencing a Mental Health or Behavioral Health Crisis or need someone to talk to, please call the Suicide and Crisis Lifeline: 98  Kathyrn Sheriff, RN, MSN, BSN, CCM [facility] Care Coordinator [phone]

## 2022-12-21 ENCOUNTER — Ambulatory Visit (HOSPITAL_COMMUNITY): Payer: 59

## 2022-12-24 ENCOUNTER — Ambulatory Visit (HOSPITAL_COMMUNITY): Payer: 59

## 2022-12-26 ENCOUNTER — Ambulatory Visit (HOSPITAL_COMMUNITY): Payer: 59

## 2022-12-28 ENCOUNTER — Ambulatory Visit (HOSPITAL_COMMUNITY): Payer: 59

## 2022-12-31 ENCOUNTER — Ambulatory Visit (HOSPITAL_COMMUNITY): Payer: 59

## 2023-01-02 ENCOUNTER — Ambulatory Visit (HOSPITAL_COMMUNITY): Payer: 59

## 2023-01-03 ENCOUNTER — Ambulatory Visit: Payer: 59 | Attending: Cardiovascular Disease | Admitting: Cardiovascular Disease

## 2023-01-03 ENCOUNTER — Encounter: Payer: Self-pay | Admitting: Cardiovascular Disease

## 2023-01-03 VITALS — BP 112/70 | HR 67 | Ht 73.0 in | Wt 273.2 lb

## 2023-01-03 DIAGNOSIS — I255 Ischemic cardiomyopathy: Secondary | ICD-10-CM

## 2023-01-03 LAB — CUP PACEART INCLINIC DEVICE CHECK
Battery Remaining Longevity: 94 mo
Brady Statistic RA Percent Paced: 40 %
Brady Statistic RV Percent Paced: 99.27 %
Date Time Interrogation Session: 20240725163713
HighPow Impedance: 67.5 Ohm
Implantable Lead Connection Status: 753985
Implantable Lead Connection Status: 753985
Implantable Lead Connection Status: 753985
Implantable Lead Implant Date: 20240305
Implantable Lead Implant Date: 20240305
Implantable Lead Implant Date: 20240305
Implantable Lead Location: 753858
Implantable Lead Location: 753859
Implantable Lead Location: 753860
Implantable Pulse Generator Implant Date: 20240305
Lead Channel Impedance Value: 1150 Ohm
Lead Channel Impedance Value: 425 Ohm
Lead Channel Impedance Value: 525 Ohm
Lead Channel Pacing Threshold Amplitude: 0.75 V
Lead Channel Pacing Threshold Amplitude: 0.75 V
Lead Channel Pacing Threshold Amplitude: 0.75 V
Lead Channel Pacing Threshold Amplitude: 0.75 V
Lead Channel Pacing Threshold Amplitude: 1 V
Lead Channel Pacing Threshold Amplitude: 1 V
Lead Channel Pacing Threshold Pulse Width: 0.5 ms
Lead Channel Pacing Threshold Pulse Width: 0.5 ms
Lead Channel Pacing Threshold Pulse Width: 0.5 ms
Lead Channel Pacing Threshold Pulse Width: 0.5 ms
Lead Channel Pacing Threshold Pulse Width: 0.5 ms
Lead Channel Pacing Threshold Pulse Width: 0.5 ms
Lead Channel Sensing Intrinsic Amplitude: 3.2 mV
Lead Channel Sensing Intrinsic Amplitude: 5.5 mV
Lead Channel Setting Pacing Amplitude: 1.625
Lead Channel Setting Pacing Amplitude: 1.625
Lead Channel Setting Pacing Amplitude: 1.75 V
Lead Channel Setting Pacing Pulse Width: 0.5 ms
Lead Channel Setting Pacing Pulse Width: 0.5 ms
Lead Channel Setting Sensing Sensitivity: 0.5 mV
Pulse Gen Serial Number: 810089146

## 2023-01-03 NOTE — Progress Notes (Signed)
  Electrophysiology Office Note:    Date:  01/03/2023   ID:  [name] [name], DOB 1955/11/20, MRN [id number]  PCP:  Corwin Levins, MD   [facility] Providers Cardiologist:  Tonny Bollman, MD Cardiology APP:  Beatrice Lecher, PA-C  Electrophysiologist:  Maurice Small, MD     Referring MD: Corwin Levins, MD   History of Present Illness:    [name] [name] is a 67 y.o. male with a medical history significant for CHF with reduced EF, CAD and ischemic cardiomyopathy, VT, Abbott BiV ICD in place, right bundle branch block, who presents for device and rhythm follow-up.[name]     He presented to the ER in March 2024 with ventricular tachycardia.  Amiodarone was started, but his VT resolved after he received lidocaine.  Coronary angiogram showed severe multivessel disease with a 99% in-stent restenosis of mid RCA lesion and distal occlusion of the RCA as well as a 90% stenosis of the apical LAD.  He underwent placement of a Abbott CRT-D device for secondary prevention of VT and management of CHF.  The indication for CRT was a wide right bundle branch block.  He was discharged on mexiletine and lidocaine     he has no device related complaints -- no new tenderness, drainage, redness.  EKGs/Labs/Other Studies Reviewed Today:    Echocardiogram:  TTE 08/12/2022 EF 30-35%, WMAs present, Gr II diastolic dysfunction. Mildly dilated LA   Monitors:   Stress testing:   Advanced imaging:   Cardiac catherization  08/2022 Severe CAD - 99% RCA lesion, 90% distal LAD lesion too distal for intervention  EKG:   EKG Interpretation Date/Time:  Thursday January 03 2023 09:10:50 EDT Ventricular Rate:  67 PR Interval:  162 QRS Duration:  182 QT Interval:  486 QTC Calculation: 513 R Axis:   258  Text Interpretation: Atrial-sensed ventricular-paced rhythm with Premature atrial complexes with Abberant conduction Biventricular pacemaker detected When compared with ECG of 01-Oct-2022 10:36, PAC is  present, no signifcant change Vent. rate has decreased BY   3 BPM Confirmed by York Pellant [phone]) on 01/03/2023 10:00:05 AM     Physical Exam:    VS:  BP 112/70   Pulse 67   Ht 6\' 1"  (1.854 m)   Wt 273 lb 3.2 oz (123.9 kg)   SpO2 97%   BMI 36.04 kg/m     Wt Readings from Last 3 Encounters:  01/03/23 273 lb 3.2 oz (123.9 kg)  11/21/22 281 lb (127.5 kg)  10/17/22 288 lb (130.6 kg)     GEN: Well nourished, well developed in no acute distress CARDIAC: RRR, no murmurs, rubs, gallops RESPIRATORY:  Normal work of breathing MUSCULOSKELETAL: no edema    ASSESSMENT & PLAN:    VT S/p admission 08/2022 for VT storm, controlled with mexilitine 250 and amiodarone 200 High risk medications - labs 09/2022 reviewed No events on interrogation today  Abbott BiV ICD in place Normal function I reviewed the interrogation today; see paceart for details  CHFrEF (30-35%) Continue carvedilol 6.25, vitals 30-30 7.5, Entresto 97-103, Aldactone 25 Appears euvolemic and compensated today  CAD Chronic severe disease not amenable to intervention Continue atorvastatin 80, carvedilol 6.25, clopidogrel 75, aspirin 81 mg    Signed, Maurice Small, MD  01/03/2023 10:10 AM    [facility]

## 2023-01-03 NOTE — Patient Instructions (Signed)
Medication Instructions:  Your physician recommends that you continue on your current medications as directed. Please refer to the Current Medication list given to you today. *If you need a refill on your cardiac medications before your next appointment, please call your pharmacy*   Follow-Up: At [facility], you and your health needs are our priority.  As part of our continuing mission to provide you with exceptional heart care, we have created designated Provider Care Teams.  These Care Teams include your primary Cardiologist (physician) and Advanced Practice Providers (APPs -  Physician Assistants and Nurse Practitioners) who all work together to provide you with the care you need, when you need it.  We recommend signing up for the patient portal called "MyChart".  Sign up information is provided on this After Visit Summary.  MyChart is used to connect with patients for Virtual Visits (Telemedicine).  Patients are able to view lab/test results, encounter notes, upcoming appointments, etc.  Non-urgent messages can be sent to your provider as well.   To learn more about what you can do with MyChart, go to [email or web address].    Your next appointment:   6 month(s)  Provider:   You will see one of the following Advanced Practice Providers on your designated Care Team:   Renee Ursuy, PA-C Michael "Andy" Tillery, PA-C Brandi Ollis, NP 

## 2023-01-04 ENCOUNTER — Ambulatory Visit (HOSPITAL_COMMUNITY): Payer: 59

## 2023-01-07 ENCOUNTER — Ambulatory Visit (HOSPITAL_COMMUNITY): Payer: 59

## 2023-01-09 ENCOUNTER — Encounter (INDEPENDENT_AMBULATORY_CARE_PROVIDER_SITE_OTHER): Payer: Self-pay

## 2023-01-09 ENCOUNTER — Ambulatory Visit (HOSPITAL_COMMUNITY): Payer: 59

## 2023-01-11 ENCOUNTER — Ambulatory Visit (HOSPITAL_COMMUNITY): Payer: 59

## 2023-01-14 ENCOUNTER — Ambulatory Visit (HOSPITAL_COMMUNITY): Payer: 59

## 2023-01-15 ENCOUNTER — Other Ambulatory Visit: Payer: Self-pay | Admitting: Nurse Practitioner

## 2023-01-16 ENCOUNTER — Ambulatory Visit (HOSPITAL_COMMUNITY): Payer: 59

## 2023-01-18 ENCOUNTER — Ambulatory Visit (HOSPITAL_COMMUNITY): Payer: 59

## 2023-01-21 ENCOUNTER — Ambulatory Visit (HOSPITAL_COMMUNITY): Payer: 59

## 2023-01-21 ENCOUNTER — Ambulatory Visit: Payer: 59 | Admitting: Podiatry

## 2023-01-23 ENCOUNTER — Ambulatory Visit (HOSPITAL_COMMUNITY): Payer: 59

## 2023-01-25 ENCOUNTER — Encounter: Payer: Self-pay | Admitting: Internal Medicine

## 2023-01-25 ENCOUNTER — Ambulatory Visit: Payer: 59 | Admitting: Internal Medicine

## 2023-01-25 VITALS — BP 124/70 | HR 60 | Temp 98.0°F | Ht 73.0 in | Wt 275.0 lb

## 2023-01-25 DIAGNOSIS — I10 Essential (primary) hypertension: Secondary | ICD-10-CM | POA: Diagnosis not present

## 2023-01-25 DIAGNOSIS — Z7985 Long-term (current) use of injectable non-insulin antidiabetic drugs: Secondary | ICD-10-CM

## 2023-01-25 DIAGNOSIS — F32A Depression, unspecified: Secondary | ICD-10-CM | POA: Diagnosis not present

## 2023-01-25 DIAGNOSIS — E782 Mixed hyperlipidemia: Secondary | ICD-10-CM | POA: Diagnosis not present

## 2023-01-25 DIAGNOSIS — E1159 Type 2 diabetes mellitus with other circulatory complications: Secondary | ICD-10-CM | POA: Diagnosis not present

## 2023-01-25 DIAGNOSIS — E559 Vitamin D deficiency, unspecified: Secondary | ICD-10-CM

## 2023-01-25 DIAGNOSIS — E538 Deficiency of other specified B group vitamins: Secondary | ICD-10-CM | POA: Diagnosis not present

## 2023-01-25 MED ORDER — TIRZEPATIDE 5 MG/0.5ML ~~LOC~~ SOAJ
5.0000 mg | SUBCUTANEOUS | 3 refills | Status: DC
Start: 2023-01-25 — End: 2023-08-06

## 2023-01-25 NOTE — Progress Notes (Unsigned)
Patient ID: [name] [name], male   DOB: 1955-10-23, 67 y.o.   MRN: [id number]        Chief Complaint: follow up HTN, HLD and hyperglycemia , low b12 and low Vit D, depression       HPI:  [name] [name] is a 67 y.o. male here overall doing ok, Has had mild worsening depressive symptoms, but no suicidal ideation, or panic; has ongoing anxiety, not increased recently.   Pt denies chest pain, increased sob or doe, wheezing, orthopnea, PND, increased LE swelling, palpitations, dizziness or syncope.   Pt denies polydipsia, polyuria, or new focal neuro s/s.    Pt denies fever, wt loss, night sweats, loss of appetite, or other constitutional symptoms         Wt Readings from Last 3 Encounters:  01/25/23 275 lb (124.7 kg)  01/03/23 273 lb 3.2 oz (123.9 kg)  11/21/22 281 lb (127.5 kg)   BP Readings from Last 3 Encounters:  01/25/23 124/70  01/03/23 112/70  11/21/22 118/80         Past Medical History:  Diagnosis Date   CAD (coronary artery disease)    a. s/p BMS pRCA 2002; b. DES to pRCA & DES p/m RCA 2006; c. PCI/DES OM4 2007; d. PCI/CBA to RCA for ISR 10/2006; e.08/2012 STEMI/Cath/PCI:  LAD 95% >> PCI: Promus Prem DES  //  f. NSTEMI 10/15 >> LHC: mLAD stent ok, dLAD 70, OM1 CTO, OM2 stent ok, dOM2 90, OM3 30-40, dLCx 90, p-mRCA stent ok, mRCA stent ok w/ 60-70 ISR, EF 50% >> med Rx // MV 1/17: no ischemia // Cath ([facility]) 05/2019: RCA 100 (CTO)    Combined systolic and diastolic CHF    a. Myoview 1/17: EF 35%  //  b. Echo 1/17 EF 45-50% // Echo 05/2019: EF 50-55 // Echocardiogram 8/21: EF 50, inf AK, post and mid inf HK, mod LVH, RVSP 25.3, mild to mod LAE, trivial MR    Depression    Diabetes mellitus (HCC)    a. A1c 8.8 08/2012->Metformin initiated. => b. A1c (9/14): 6.6   Gastroesophageal reflux disease    History of nuclear stress test    a. Myoview 1/17: EF 35%, fixed inferior lateral defect consistent with infarct, no ischemia, intermediate risk   History of pneumonia    History of stroke  01/2021   Zio Monitor 11/22: NSR, avg HR 61; PVCs (1.9%); no AFib, no high grade HB, no ventricular arrhythmias   HTN (hypertension)    Hx of NSTEMI    Hyperlipidemia    Ischemic cardiomyopathy    a. EF 40%; improved to normal;  b. 08/2012 Echo: EF 50-55%, mod LVH.//  c. Echo 9/16: Inf HK, mild LVH, EF 55%, mild LAE, normal RVF, mild RAE, PASP 35 mmHg  //  d. Echo 1/17: EF 45-50%, inferior HK, mild BAE, PASP 33 mmHg   Obesity    OSA (obstructive sleep apnea)    Does not use CPAP as of 05/2011   Tobacco abuse    Past Surgical History:  Procedure Laterality Date   BIV ICD INSERTION CRT-D N/A 08/14/2022   Procedure: BIV ICD INSERTION CRT-D;  Surgeon: Maurice Small, MD;  Location: [facility] INVASIVE CV LAB;  Service: Cardiovascular;  Laterality: N/A;   CORONARY ANGIOPLASTY WITH STENT PLACEMENT     CORONARY STENT PLACEMENT  2009   LEFT HEART CATH AND CORONARY ANGIOGRAPHY N/A 08/13/2022   Procedure: LEFT HEART CATH AND CORONARY ANGIOGRAPHY;  Surgeon: Yvonne Kendall, MD;  Location:  [facility] INVASIVE CV LAB;  Service: Cardiovascular;  Laterality: N/A;   LEFT HEART CATHETERIZATION WITH CORONARY ANGIOGRAM N/A 08/31/2012   Procedure: LEFT HEART CATHETERIZATION WITH CORONARY ANGIOGRAM;  Surgeon: Kathleene Hazel, MD;  Location: [facility] CATH LAB;  Service: Cardiovascular;  Laterality: N/A;   LEFT HEART CATHETERIZATION WITH CORONARY ANGIOGRAM N/A 11/16/2013   Procedure: LEFT HEART CATHETERIZATION WITH CORONARY ANGIOGRAM;  Surgeon: Micheline Chapman, MD;  Location: [facility] CATH LAB;  Service: Cardiovascular;  Laterality: N/A;   LEFT HEART CATHETERIZATION WITH CORONARY ANGIOGRAM N/A 03/15/2014   Procedure: LEFT HEART CATHETERIZATION WITH CORONARY ANGIOGRAM;  Surgeon: Peter M Swaziland, MD;  Location: [facility] CATH LAB;  Service: Cardiovascular;  Laterality: N/A;   PERCUTANEOUS CORONARY STENT INTERVENTION (PCI-S) Right 08/31/2012   Procedure: PERCUTANEOUS CORONARY STENT INTERVENTION (PCI-S);  Surgeon: Kathleene Hazel, MD;   Location: [facility] CATH LAB;  Service: Cardiovascular;  Laterality: Right;   PERCUTANEOUS CORONARY STENT INTERVENTION (PCI-S)  11/16/2013   Procedure: PERCUTANEOUS CORONARY STENT INTERVENTION (PCI-S);  Surgeon: Micheline Chapman, MD;  Location: [facility] CATH LAB;  Service: Cardiovascular;;    reports that he has quit smoking. His smoking use included cigarettes. He has a 15 pack-year smoking history. He has never used smokeless tobacco. He reports that he does not drink alcohol and does not use drugs. family history includes Cancer in his mother; Coronary artery disease in an other family member; Depression in his mother; Hypertension in his mother; Other in his father. Allergies  Allergen Reactions   Penicillin G Other (See Comments)    Did it involve swelling of the face/tongue/throat, SOB, or low BP?  N/A Did it involve sudden or severe rash/hives, skin peeling, or any reaction on the inside of your mouth or nose? N/A Did you need to seek medical attention at a hospital or doctor's office? N/A When did it last happen? Child If all above answers are "NO", may proceed with cephalosporin use.   Sglt2 Inhibitors Other (See Comments)    Hospn April 2024 with UTI, DKA   Penicillins Other (See Comments)    Did it involve swelling of the face/tongue/throat, SOB, or low BP? N/A Did it involve sudden or severe rash/hives, skin peeling, or any reaction on the inside of your mouth or nose?N/A Did you need to seek medical attention at a hospital or doctor's office? N/A When did it last happen? Child      If all above answers are "NO", may proceed with cephalosporin use.   Current Outpatient Medications on File Prior to Visit  Medication Sig Dispense Refill   acetaminophen (TYLENOL) 500 MG tablet Take 1,000 mg by mouth every 6 (six) hours as needed for moderate pain.     albuterol (VENTOLIN HFA) 108 (90 Base) MCG/ACT inhaler Inhale 2 puffs into the lungs every 6 (six) hours as needed for wheezing or shortness of  breath. 8 g 11   Alcohol Swabs (B-D SINGLE USE SWABS BUTTERFLY) PADS Use up to 4 times a day to test blood sugars. Dx: E10.9, E11.9 100 each 11   amiodarone (PACERONE) 200 MG tablet Take 1 tablet (200 mg total) by mouth daily. 90 tablet 3   aspirin EC 81 MG tablet Take 1 tablet (81 mg total) by mouth daily. Swallow whole. 90 tablet 3   atorvastatin (LIPITOR) 80 MG tablet Take 1 tablet (80 mg total) by mouth every evening. 90 tablet 3   Blood Glucose Calibration (TRUE METRIX LEVEL 1) Low SOLN Use as needed. Dx: E10.9, E11.9 1 each 0  Blood Glucose Monitoring Suppl (ONETOUCH VERIO FLEX SYSTEM) DEVI 1 Device by Does not apply route in the morning, at noon, in the evening, and at bedtime. E11.9 1 each 0   carvedilol (COREG) 6.25 MG tablet Take 1 tablet (6.25 mg total) by mouth 2 (two) times daily with a meal. 180 tablet 3   clopidogrel (PLAVIX) 75 MG tablet Take 1 tablet (75 mg total) by mouth daily. 90 tablet 3   dorzolamide-timolol (COSOPT) 2-0.5 % ophthalmic solution Place 1 drop into both eyes See admin instructions. Instill one drop into both eyes twice in the morning and once at night     ezetimibe (ZETIA) 10 MG tablet Take 1 tablet (10 mg total) by mouth daily. 90 tablet 3   fluticasone (FLOVENT HFA) 110 MCG/ACT inhaler Inhale 2 puffs into the lungs 2 (two) times daily. 1 each 12   furosemide (LASIX) 40 MG tablet Take 1 tablet (40 mg total) by mouth as needed for fluid or edema. As needed for 3lb weight gain 30 tablet 10   glucose blood (ONETOUCH VERIO) test strip Use as instructed four times per day E11.9 400 each 12   isosorbide-hydrALAZINE (BIDIL) 20-37.5 MG tablet TAKE 1 TABLET BY MOUTH THREE TIMES DAILY 270 tablet 2   Lancets MISC Use as directed four times per day E11.9 400 each 3   latanoprost (XALATAN) 0.005 % ophthalmic solution 1 drop at bedtime.     mexiletine (MEXITIL) 250 MG capsule Take 1 capsule (250 mg total) by mouth every 12 (twelve) hours. 180 capsule 3   nitroGLYCERIN  (NITROSTAT) 0.4 MG SL tablet Place 1 tablet (0.4 mg total) under the tongue every 5 (five) minutes as needed for chest pain. N 25 tablet 3   pantoprazole (PROTONIX) 40 MG tablet Take 1 tablet (40 mg total) by mouth daily. 90 tablet 3   PARoxetine (PAXIL) 20 MG tablet TAKE 1 TABLET(20 MG) BY MOUTH DAILY 90 tablet 3   sacubitril-valsartan (ENTRESTO) 97-103 MG Take 1 tablet by mouth 2 (two) times daily. 60 tablet 5   spironolactone (ALDACTONE) 25 MG tablet Take 1 tablet (25 mg total) by mouth daily. 90 tablet 3   gabapentin (NEURONTIN) 300 MG capsule Take 300 mg by mouth daily. (Patient not taking: Reported on 01/25/2023)     No current facility-administered medications on file prior to visit.        ROS:  All others reviewed and negative.  Objective        PE:  BP 124/70 (BP Location: Right Arm, Patient Position: Sitting, Cuff Size: Normal)   Pulse 60   Temp 98 F (36.7 C) (Oral)   Ht 6\' 1"  (1.854 m)   Wt 275 lb (124.7 kg)   SpO2 98%   BMI 36.28 kg/m                 Constitutional: Pt appears in NAD               HENT: Head: NCAT.                Right Ear: External ear normal.                 Left Ear: External ear normal.                Eyes: . Pupils are equal, round, and reactive to light. Conjunctivae and EOM are normal               Nose: without d/c  or deformity               Neck: Neck supple. Gross normal ROM               Cardiovascular: Normal rate and regular rhythm.                 Pulmonary/Chest: Effort normal and breath sounds without rales or wheezing.                Abd:  Soft, NT, ND, + BS, no organomegaly               Neurological: Pt is alert. At baseline orientation, motor grossly intact               Skin: Skin is warm. No rashes, no other new lesions, LE edema - depressed affect               Psychiatric: Pt behavior is normal without agitation   Micro: none  Cardiac tracings I have personally interpreted today:  none  Pertinent Radiological findings  (summarize): none   Lab Results  Component Value Date   WBC 4.3 11/21/2022   HGB 13.8 11/21/2022   HCT 42.4 11/21/2022   PLT 195.0 11/21/2022   GLUCOSE 107 (H) 11/21/2022   CHOL 95 11/21/2022   TRIG 53.0 11/21/2022   HDL 49.20 11/21/2022   LDLCALC 35 11/21/2022   ALT 16 11/21/2022   AST 17 11/21/2022   NA 139 11/21/2022   K 4.0 11/21/2022   CL 111 11/21/2022   CREATININE 1.51 (H) 11/21/2022   BUN 19 11/21/2022   CO2 21 11/21/2022   TSH 0.924 09/15/2022   PSA 1.40 07/27/2022   INR 1.0 09/15/2022   HGBA1C 6.2 11/21/2022   MICROALBUR 2.9 (H) 07/27/2022   Assessment/Plan:  [name] [name] is a 67 y.o. Black or African American [2] male with  has a past medical history of CAD (coronary artery disease), Combined systolic and diastolic CHF, Depression, Diabetes mellitus (HCC), Gastroesophageal reflux disease, History of nuclear stress test, History of pneumonia, History of stroke (01/2021), HTN (hypertension), Hx of NSTEMI, Hyperlipidemia, Ischemic cardiomyopathy, Obesity, OSA (obstructive sleep apnea), and Tobacco abuse.  Depression With recent mild worsening, declines med change, for psychiatry referral  DM2 (diabetes mellitus, type 2) (HCC) Lab Results  Component Value Date   HGBA1C 6.2 11/21/2022   With uncontrolled obesity, pt for increased mounjaro to 5 mg weekly   Hyperlipidemia Lab Results  Component Value Date   LDLCALC 35 11/21/2022   Stable, pt to continue current statin lipior 80 every day, zetia 10 qd   Essential hypertension BP Readings from Last 3 Encounters:  01/25/23 124/70  01/03/23 112/70  11/21/22 118/80   Stable, pt to continue medical treatment coreg 6.25 bid   Vitamin D deficiency Last vitamin D Lab Results  Component Value Date   VD25OH 38.92 11/21/2022   Low, to start oral replacement   B12 deficiency Lab Results  Component Value Date   VITAMINB12 299 11/21/2022   Low, to start oral replacement - b12 1000 mcg qd  Followup:  Return in about 6 months (around 07/28/2023).  Oliver Barre, MD 01/29/2023 9:56 PM [facility]

## 2023-01-25 NOTE — Patient Instructions (Addendum)
Your- MyChart Username [email or web address]  New Mychart Password:  [id number]  Ok to increase the Mounjaro 5 mg weekly  Please take OTC Vitamin D3 at 2000 units per day, indefinitely  Ok to take the OTC B12 1000 mcg per day  Please have your Shingrix (shingles) shots done at your local pharmacy., and the Tdap tetanus and the flu shot   Please continue all other medications as before, and refills have been done if requested.  Please have the pharmacy call with any other refills you may need.  Please continue your efforts at being more active, low cholesterol diet, and weight control..  Please keep your appointments with your specialists as you may have planned - cardiology in Jan 2025  You will be contacted regarding the referral for: psychiatry  No further lab work needed today  Please make an Appointment to return in 6 months, or sooner if needed

## 2023-01-28 ENCOUNTER — Ambulatory Visit: Payer: Self-pay

## 2023-01-28 ENCOUNTER — Ambulatory Visit (HOSPITAL_COMMUNITY): Payer: 59

## 2023-01-28 NOTE — Patient Outreach (Signed)
  Care Coordination   Follow Up Visit Note   01/28/2023 Name: [name] [name] MRN: [id number] DOB: May 15, 1956  [name] [name] is a 67 y.o. year old male who sees [name] Levins, MD for primary care. I spoke with  [name] [name] by phone today.  What matters to the patients health and wellness today?  [name] [name] reports doing well. Office visit with PCP on 01/25/23 and cardiology on 01/03/23. He continues to keep track of weights. He denies any signs/symptoms of HF exacerbation. Blood sugar 81 fasting this morning. He states he is doing well and denies any care coordination needs at this time.  Goals Addressed             This Visit's Progress    COMPLETED: continue to improve post hospitalization       Interventions Today    Flowsheet Row Most Recent Value  Chronic Disease   Chronic disease during today's visit Hypertension (HTN), Congestive Heart Failure (CHF)  General Interventions   General Interventions Discussed/Reviewed General Interventions Reviewed  [advised to contact RNCM if care coordination needs in the future. confirmed patient has RNCM's contact number.]  Exercise Interventions   Exercise Discussed/Reviewed Physical Activity  Education Interventions   Education Provided Provided Education  Provided Verbal Education On Other, Medication, When to see the doctor, Blood Sugar Monitoring  [reviewed patient instructions per provider office visit, reviewed signs/symptoms of HF exacerbation and when to call the doctor. advised to continue to take medications as prescribed]  Nutrition Interventions   Nutrition Discussed/Reviewed Nutrition Reviewed  Pharmacy Interventions   Pharmacy Dicussed/Reviewed Pharmacy Topics Reviewed            SDOH assessments and interventions completed:  No  Care Coordination Interventions:  Yes, provided   Follow up plan: No further intervention required.   Encounter Outcome:  Pt. Visit Completed   Kathyrn Sheriff, RN, MSN, BSN, CCM [facility] Care  Coordinator [phone]

## 2023-01-28 NOTE — Patient Instructions (Signed)
 Visit Information  Thank you for taking time to visit with me today. Please don't hesitate to contact me if I can be of assistance to you.   Following are the goals we discussed today:  Attend provider visits as scheduled Take medications as prescribed Contact provider with health questions or concerns as needed   If you are experiencing a Mental Health or Behavioral Health Crisis or need someone to talk to, please call the Suicide and Crisis Lifeline: 988 call the [place] National Suicide Prevention Lifeline: (660)344-1847 or TTY: (513)416-7014 TTY 629-355-0116) to talk to a trained counselor call 1-800-273-TALK (toll free, 24 hour hotline)  Kathyrn Sheriff, RN, MSN, BSN, CCM [facility] Care Coordinator [phone]

## 2023-01-29 ENCOUNTER — Encounter: Payer: Self-pay | Admitting: Internal Medicine

## 2023-01-29 NOTE — Assessment & Plan Note (Signed)
Last vitamin D Lab Results  Component Value Date   VD25OH 38.92 11/21/2022   Low, to start oral replacement

## 2023-01-29 NOTE — Assessment & Plan Note (Signed)
BP Readings from Last 3 Encounters:  01/25/23 124/70  01/03/23 112/70  11/21/22 118/80   Stable, pt to continue medical treatment coreg 6.25 bid

## 2023-01-29 NOTE — Assessment & Plan Note (Signed)
With recent mild worsening, declines med change, for psychiatry referral

## 2023-01-29 NOTE — Assessment & Plan Note (Signed)
Lab Results  Component Value Date   HGBA1C 6.2 11/21/2022   With uncontrolled obesity, pt for increased mounjaro to 5 mg weekly

## 2023-01-29 NOTE — Assessment & Plan Note (Signed)
Lab Results  Component Value Date   LDLCALC 35 11/21/2022   Stable, pt to continue current statin lipior 80 every day, zetia 10 qd

## 2023-01-29 NOTE — Assessment & Plan Note (Signed)
Lab Results  Component Value Date   VITAMINB12 299 11/21/2022   Low, to start oral replacement - b12 1000 mcg qd

## 2023-01-30 ENCOUNTER — Ambulatory Visit (HOSPITAL_COMMUNITY): Payer: 59

## 2023-02-01 ENCOUNTER — Ambulatory Visit (HOSPITAL_COMMUNITY): Payer: 59

## 2023-02-04 ENCOUNTER — Encounter: Payer: Self-pay | Admitting: Podiatry

## 2023-02-04 ENCOUNTER — Ambulatory Visit (INDEPENDENT_AMBULATORY_CARE_PROVIDER_SITE_OTHER): Payer: 59 | Admitting: Podiatry

## 2023-02-04 DIAGNOSIS — E1159 Type 2 diabetes mellitus with other circulatory complications: Secondary | ICD-10-CM | POA: Diagnosis not present

## 2023-02-04 DIAGNOSIS — L84 Corns and callosities: Secondary | ICD-10-CM | POA: Diagnosis not present

## 2023-02-04 DIAGNOSIS — M79675 Pain in left toe(s): Secondary | ICD-10-CM | POA: Diagnosis not present

## 2023-02-04 DIAGNOSIS — B351 Tinea unguium: Secondary | ICD-10-CM | POA: Diagnosis not present

## 2023-02-04 DIAGNOSIS — M79674 Pain in right toe(s): Secondary | ICD-10-CM

## 2023-02-04 NOTE — Progress Notes (Signed)
This patient returns to my office for at risk foot care.  This patient requires this care by a professional since this patient will be at risk due to having pre-ulcerative calluses, CKD and diabetic neuropathy and coagulation defect.  Patient is taking plavix.  This patient is unable to cut nails himself since the patient cannot reach his nails.These nails are painful walking and wearing shoes.  This patient presents for at risk foot care today.    General Appearance  Alert, conversant and in no acute stress.  Vascular  Dorsalis pedis and posterior tibial  pulses are  weakly  palpable  bilaterally.  Capillary return is within normal limits  bilaterally. Temperature is within normal limits  bilaterally.  Neurologic  Senn-Weinstein monofilament wire test diminished   bilaterally. Muscle power within normal limits bilaterally.  Nails Thick disfigured discolored nails with subungual debris  from hallux to fifth toes bilaterally. No evidence of bacterial infection or drainage bilaterally.  Orthopedic  No limitations of motion  feet .  No crepitus or effusions noted.  No bony pathology or digital deformities noted.  Plantar flexed second metatarsal  B/L.  Skin  normotropic skin  noted bilaterally.  No signs of infections or ulcers noted.   Pre-ulcerous callus sub 2  B/L asymptomatic,  Onychomycosis  Pain in right toes  Pain in left toes  Porokeratosis/Pre-ulcerous callus  B/L.  Consent was obtained for treatment procedures.   Mechanical debridement of nails 1-5  bilaterally performed with a nail nipper.  Filed with dremel without incident.  Debride pre-ulcerous calluses with # 15 blade.   Return office visit     10 weeks               Told patient to return for periodic foot care and evaluation due to potential at risk complications.   Helane Gunther DPM

## 2023-02-14 ENCOUNTER — Ambulatory Visit (INDEPENDENT_AMBULATORY_CARE_PROVIDER_SITE_OTHER): Payer: 59

## 2023-02-14 DIAGNOSIS — I255 Ischemic cardiomyopathy: Secondary | ICD-10-CM

## 2023-02-14 LAB — CUP PACEART REMOTE DEVICE CHECK
Battery Remaining Longevity: 91 mo
Battery Remaining Percentage: 92 %
Battery Voltage: 3.02 V
Brady Statistic AP VP Percent: 71 %
Brady Statistic AP VS Percent: 1 %
Brady Statistic AS VP Percent: 29 %
Brady Statistic AS VS Percent: 1 %
Brady Statistic RA Percent Paced: 70 %
Date Time Interrogation Session: 20240905071259
HighPow Impedance: 68 Ohm
Implantable Lead Connection Status: 753985
Implantable Lead Connection Status: 753985
Implantable Lead Connection Status: 753985
Implantable Lead Implant Date: 20240305
Implantable Lead Implant Date: 20240305
Implantable Lead Implant Date: 20240305
Implantable Lead Location: 753858
Implantable Lead Location: 753859
Implantable Lead Location: 753860
Implantable Pulse Generator Implant Date: 20240305
Lead Channel Impedance Value: 1075 Ohm
Lead Channel Impedance Value: 450 Ohm
Lead Channel Impedance Value: 510 Ohm
Lead Channel Pacing Threshold Amplitude: 0.625 V
Lead Channel Pacing Threshold Amplitude: 0.75 V
Lead Channel Pacing Threshold Amplitude: 0.75 V
Lead Channel Pacing Threshold Pulse Width: 0.5 ms
Lead Channel Pacing Threshold Pulse Width: 0.5 ms
Lead Channel Pacing Threshold Pulse Width: 0.5 ms
Lead Channel Sensing Intrinsic Amplitude: 11.5 mV
Lead Channel Sensing Intrinsic Amplitude: 3.7 mV
Lead Channel Setting Pacing Amplitude: 1.625
Lead Channel Setting Pacing Amplitude: 1.75 V
Lead Channel Setting Pacing Amplitude: 1.75 V
Lead Channel Setting Pacing Pulse Width: 0.5 ms
Lead Channel Setting Pacing Pulse Width: 0.5 ms
Lead Channel Setting Sensing Sensitivity: 0.5 mV
Pulse Gen Serial Number: 810089146

## 2023-02-22 NOTE — Progress Notes (Signed)
Remote ICD transmission.   

## 2023-03-08 ENCOUNTER — Other Ambulatory Visit (HOSPITAL_COMMUNITY): Payer: Self-pay | Admitting: Adult Health

## 2023-03-19 ENCOUNTER — Other Ambulatory Visit (HOSPITAL_COMMUNITY): Payer: Self-pay | Admitting: Pharmacist

## 2023-03-19 MED ORDER — ENTRESTO 97-103 MG PO TABS: 1.0000 | ORAL_TABLET | Freq: Two times a day (BID) | ORAL | 2 refills | Status: DC

## 2023-03-19 NOTE — Addendum Note (Signed)
Addended by: Theresia Bough on: 03/19/2023 02:04 PM   Modules accepted: Orders

## 2023-04-11 ENCOUNTER — Telehealth: Payer: Self-pay | Admitting: Radiology

## 2023-04-11 NOTE — Telephone Encounter (Signed)
Medical Clearance for Dental Treatment Form has been faxed.

## 2023-05-01 ENCOUNTER — Encounter: Payer: Self-pay | Admitting: Podiatry

## 2023-05-01 ENCOUNTER — Ambulatory Visit (INDEPENDENT_AMBULATORY_CARE_PROVIDER_SITE_OTHER): Payer: 59 | Admitting: Podiatry

## 2023-05-01 DIAGNOSIS — M79675 Pain in left toe(s): Secondary | ICD-10-CM | POA: Diagnosis not present

## 2023-05-01 DIAGNOSIS — M79674 Pain in right toe(s): Secondary | ICD-10-CM | POA: Diagnosis not present

## 2023-05-01 DIAGNOSIS — B351 Tinea unguium: Secondary | ICD-10-CM

## 2023-05-01 DIAGNOSIS — L989 Disorder of the skin and subcutaneous tissue, unspecified: Secondary | ICD-10-CM

## 2023-05-01 DIAGNOSIS — E1159 Type 2 diabetes mellitus with other circulatory complications: Secondary | ICD-10-CM | POA: Diagnosis not present

## 2023-05-01 NOTE — Progress Notes (Signed)
This patient returns to my office for at risk foot care.  This patient requires this care by a professional since this patient will be at risk due to having pre-ulcerative calluses, CKD and diabetic neuropathy and coagulation defect.  Patient is taking plavix.  This patient is unable to cut nails himself since the patient cannot reach his nails.These nails are painful walking and wearing shoes.  He also has callus on his right forefoot and heel. This patient presents for at risk foot care today.    General Appearance  Alert, conversant and in no acute stress.  Vascular  Dorsalis pedis and posterior tibial  pulses are  weakly  palpable  bilaterally.  Capillary return is within normal limits  bilaterally. Temperature is within normal limits  bilaterally.  Neurologic  Senn-Weinstein monofilament wire test diminished   bilaterally. Muscle power within normal limits bilaterally.  Nails Thick disfigured discolored nails with subungual debris  from hallux to fifth toes bilaterally. No evidence of bacterial infection or drainage bilaterally.  Orthopedic  No limitations of motion  feet .  No crepitus or effusions noted.  No bony pathology or digital deformities noted.  Plantar flexed second metatarsal  B/L.  Skin  normotropic skin  noted bilaterally.  No signs of infections or ulcers noted.   Pre-ulcerous callus sub 2  B/L symptomatic,  Onychomycosis  Pain in right toes  Pain in left toes  Porokeratosis/Pre-ulcerous callus  B/L.  Consent was obtained for treatment procedures.   Mechanical debridement of nails 1-5  bilaterally performed with a nail nipper.  Filed with dremel without incident.  Debride pre-ulcerous calluses with # 15 blade.  Debride heel callus with # 15 blade and remel tool.   Return office visit     10 weeks               Told patient to return for periodic foot care and evaluation due to potential at risk complications.   Helane Gunther DPM

## 2023-05-16 ENCOUNTER — Ambulatory Visit (INDEPENDENT_AMBULATORY_CARE_PROVIDER_SITE_OTHER): Payer: 59

## 2023-05-16 DIAGNOSIS — I255 Ischemic cardiomyopathy: Secondary | ICD-10-CM

## 2023-05-16 LAB — CUP PACEART REMOTE DEVICE CHECK
Battery Remaining Longevity: 85 mo
Battery Remaining Percentage: 88 %
Battery Voltage: 3.01 V
Brady Statistic AP VP Percent: 61 %
Brady Statistic AP VS Percent: 1 %
Brady Statistic AS VP Percent: 38 %
Brady Statistic AS VS Percent: 1 %
Brady Statistic RA Percent Paced: 61 %
Date Time Interrogation Session: 20241205021654
HighPow Impedance: 64 Ohm
Implantable Lead Connection Status: 753985
Implantable Lead Connection Status: 753985
Implantable Lead Connection Status: 753985
Implantable Lead Implant Date: 20240305
Implantable Lead Implant Date: 20240305
Implantable Lead Implant Date: 20240305
Implantable Lead Location: 753858
Implantable Lead Location: 753859
Implantable Lead Location: 753860
Implantable Pulse Generator Implant Date: 20240305
Lead Channel Impedance Value: 430 Ohm
Lead Channel Impedance Value: 450 Ohm
Lead Channel Impedance Value: 810 Ohm
Lead Channel Pacing Threshold Amplitude: 0.625 V
Lead Channel Pacing Threshold Amplitude: 0.625 V
Lead Channel Pacing Threshold Amplitude: 1 V
Lead Channel Pacing Threshold Pulse Width: 0.5 ms
Lead Channel Pacing Threshold Pulse Width: 0.5 ms
Lead Channel Pacing Threshold Pulse Width: 0.5 ms
Lead Channel Sensing Intrinsic Amplitude: 4.5 mV
Lead Channel Sensing Intrinsic Amplitude: 4.7 mV
Lead Channel Setting Pacing Amplitude: 1.625
Lead Channel Setting Pacing Amplitude: 1.625
Lead Channel Setting Pacing Amplitude: 2 V
Lead Channel Setting Pacing Pulse Width: 0.5 ms
Lead Channel Setting Pacing Pulse Width: 0.5 ms
Lead Channel Setting Sensing Sensitivity: 0.5 mV
Pulse Gen Serial Number: 810089146

## 2023-05-20 ENCOUNTER — Ambulatory Visit: Payer: 59 | Admitting: Internal Medicine

## 2023-06-29 ENCOUNTER — Other Ambulatory Visit (HOSPITAL_COMMUNITY): Payer: Self-pay | Admitting: Cardiology

## 2023-07-01 ENCOUNTER — Other Ambulatory Visit: Payer: Self-pay | Admitting: Internal Medicine

## 2023-07-01 NOTE — Telephone Encounter (Signed)
Copied from CRM [id number]. Topic: Clinical - Medication Refill >> Jul 01, 2023  8:57 AM Isabell A wrote: Most Recent Primary Care Visit:  Provider: Corwin Levins  Department: [facility]  Visit Type: OFFICE VISIT  Date: 01/25/2023  Medication: sacubitril-valsartan (ENTRESTO) 97-103 MG  Has the patient contacted their pharmacy? Yes (Agent: If no, request that the patient contact the pharmacy for the refill. If patient does not wish to contact the pharmacy document the reason why and proceed with request.) (Agent: If yes, when and what did the pharmacy advise?)  Is this the correct pharmacy for this prescription? Yes If no, delete pharmacy and type the correct one.  This is the patient's preferred pharmacy:  [id number] Ginette Otto, [place] - [address] AT [facility] [address] [place] [address] Phone: [phone] Fax: [phone]   Has the prescription been filled recently? Yes  Is the patient out of the medication? Yes  Has the patient been seen for an appointment in the last year OR does the patient have an upcoming appointment? Yes  Can we respond through MyChart? No  Agent: Please be advised that Rx refills may take up to 3 business days. We ask that you follow-up with your pharmacy.

## 2023-07-06 ENCOUNTER — Other Ambulatory Visit: Payer: Self-pay

## 2023-07-06 ENCOUNTER — Emergency Department (HOSPITAL_COMMUNITY): Payer: 59

## 2023-07-06 ENCOUNTER — Emergency Department (HOSPITAL_COMMUNITY)
Admission: EM | Admit: 2023-07-06 | Discharge: 2023-07-06 | Disposition: A | Discharge status: Home / Self Care | Payer: 59 | Attending: Emergency Medicine | Admitting: Emergency Medicine

## 2023-07-06 ENCOUNTER — Encounter (HOSPITAL_COMMUNITY): Payer: Self-pay

## 2023-07-06 DIAGNOSIS — E1122 Type 2 diabetes mellitus with diabetic chronic kidney disease: Secondary | ICD-10-CM | POA: Diagnosis not present

## 2023-07-06 DIAGNOSIS — Z950 Presence of cardiac pacemaker: Secondary | ICD-10-CM | POA: Diagnosis not present

## 2023-07-06 DIAGNOSIS — Z7982 Long term (current) use of aspirin: Secondary | ICD-10-CM | POA: Insufficient documentation

## 2023-07-06 DIAGNOSIS — Z7951 Long term (current) use of inhaled steroids: Secondary | ICD-10-CM | POA: Insufficient documentation

## 2023-07-06 DIAGNOSIS — N183 Chronic kidney disease, stage 3 unspecified: Secondary | ICD-10-CM | POA: Diagnosis not present

## 2023-07-06 DIAGNOSIS — Z20822 Contact with and (suspected) exposure to covid-19: Secondary | ICD-10-CM | POA: Insufficient documentation

## 2023-07-06 DIAGNOSIS — Z8673 Personal history of transient ischemic attack (TIA), and cerebral infarction without residual deficits: Secondary | ICD-10-CM | POA: Insufficient documentation

## 2023-07-06 DIAGNOSIS — R0602 Shortness of breath: Secondary | ICD-10-CM | POA: Diagnosis not present

## 2023-07-06 DIAGNOSIS — R059 Cough, unspecified: Secondary | ICD-10-CM | POA: Diagnosis not present

## 2023-07-06 LAB — BASIC METABOLIC PANEL
Anion gap: 11 (ref 5–15)
BUN: 43 mg/dL — ABNORMAL HIGH (ref 8–23)
CO2: 22 mmol/L (ref 22–32)
Calcium: 9.8 mg/dL (ref 8.9–10.3)
Chloride: 106 mmol/L (ref 98–111)
Creatinine, Ser: 2.44 mg/dL — ABNORMAL HIGH (ref 0.61–1.24)
GFR, Estimated: 28 mL/min — ABNORMAL LOW (ref >60)
Glucose, Bld: 81 mg/dL (ref 70–99)
Potassium: 4.2 mmol/L (ref 3.5–5.1)
Sodium: 139 mmol/L (ref 135–145)

## 2023-07-06 LAB — CBC WITH DIFFERENTIAL/PLATELET
Abs Immature Granulocytes: 0.01 10*3/uL (ref 0.00–0.07)
Basophils Absolute: 0.1 10*3/uL (ref 0.0–0.1)
Basophils Relative: 1 %
Eosinophils Absolute: 0.4 10*3/uL (ref 0.0–0.5)
Eosinophils Relative: 7 %
HCT: 44.1 % (ref 39.0–52.0)
Hemoglobin: 14.7 g/dL (ref 13.0–17.0)
Immature Granulocytes: 0 %
Lymphocytes Relative: 39 %
Lymphs Abs: 2.1 10*3/uL (ref 0.7–4.0)
MCH: 29.5 pg (ref 26.0–34.0)
MCHC: 33.3 g/dL (ref 30.0–36.0)
MCV: 88.6 fL (ref 80.0–100.0)
Monocytes Absolute: 0.6 10*3/uL (ref 0.1–1.0)
Monocytes Relative: 12 %
Neutro Abs: 2.2 10*3/uL (ref 1.7–7.7)
Neutrophils Relative %: 41 %
Platelets: 242 10*3/uL (ref 150–400)
RBC: 4.98 MIL/uL (ref 4.22–5.81)
RDW: 16 % — ABNORMAL HIGH (ref 11.5–15.5)
WBC: 5.4 10*3/uL (ref 4.0–10.5)
nRBC: 0 % (ref 0.0–0.2)

## 2023-07-06 LAB — RESP PANEL BY RT-PCR (RSV, FLU A&B, COVID)  RVPGX2
Influenza A by PCR: NEGATIVE
Influenza B by PCR: NEGATIVE
Resp Syncytial Virus by PCR: NEGATIVE
SARS Coronavirus 2 by RT PCR: NEGATIVE

## 2023-07-06 LAB — TROPONIN I (HIGH SENSITIVITY)
Troponin I (High Sensitivity): 27 ng/L — ABNORMAL HIGH (ref <18)
Troponin I (High Sensitivity): 22 ng/L — ABNORMAL HIGH (ref <18)

## 2023-07-06 LAB — BRAIN NATRIURETIC PEPTIDE: B Natriuretic Peptide: 33.7 pg/mL (ref 0.0–100.0)

## 2023-07-06 NOTE — ED Provider Notes (Signed)
[place] EMERGENCY DEPARTMENT AT [facility] Provider Note   CSN: [id number] Arrival date & time: 07/06/23  4696     History  Chief Complaint  Patient presents with   Shortness of Breath   HPI [name] [name] is a 68 y.o. male with CHF with indwelling ICD, history of NSTEMI, TIA, type 2 diabetes, stage III CKD presenting for shortness of breath.  States it started all of a sudden 2 days ago with a persistent cough and sneezing.  Cough is productive with clear sputum.  States he is also feeling generally weak.  Denies fever.  Also denies chest pain.  States right now he still short of breath and feels like "I am breathing through a straw".  Denies calf tenderness or swelling, recent immobilization.  States he is on Plavix.  Also reported that yesterday he did a lot of dusting and sweeping at his house.   Shortness of Breath      Home Medications Prior to Admission medications   Medication Sig Start Date End Date Taking? Authorizing Provider  acetaminophen (TYLENOL) 500 MG tablet Take 1,000 mg by mouth every 6 (six) hours as needed for moderate pain.    [provider]  albuterol (VENTOLIN HFA) 108 (90 Base) MCG/ACT inhaler Inhale 2 puffs into the lungs every 6 (six) hours as needed for wheezing or shortness of breath. 11/21/22   Corwin Levins, MD  Alcohol Swabs (B-D SINGLE USE SWABS BUTTERFLY) PADS Use up to 4 times a day to test blood sugars. Dx: E10.9, E11.9 09/30/20   Corwin Levins, MD  amiodarone (PACERONE) 200 MG tablet Take 1 tablet (200 mg total) by mouth daily. 08/27/22   Swinyer, Zachary George, NP  aspirin EC 81 MG tablet Take 1 tablet (81 mg total) by mouth daily. Swallow whole. 08/27/22   Swinyer, Zachary George, NP  atorvastatin (LIPITOR) 80 MG tablet Take 1 tablet (80 mg total) by mouth every evening. 08/27/22   Swinyer, Zachary George, NP  Blood Glucose Calibration (TRUE METRIX LEVEL 1) Low SOLN Use as needed. Dx: E10.9, E11.9 07/27/22   Corwin Levins, MD  Blood  Glucose Monitoring Suppl (ONETOUCH VERIO FLEX SYSTEM) DEVI 1 Device by Does not apply route in the morning, at noon, in the evening, and at bedtime. E11.9 08/10/22   Corwin Levins, MD  carvedilol (COREG) 6.25 MG tablet Take 1 tablet (6.25 mg total) by mouth 2 (two) times daily with a meal. 08/27/22   Swinyer, Zachary George, NP  clopidogrel (PLAVIX) 75 MG tablet Take 1 tablet (75 mg total) by mouth daily. 08/27/22   Swinyer, Zachary George, NP  dorzolamide-timolol (COSOPT) 2-0.5 % ophthalmic solution Place 1 drop into both eyes See admin instructions. Instill one drop into both eyes twice in the morning and once at night 03/12/22   [provider]  ENTRESTO 97-103 MG TAKE 1 TABLET BY MOUTH TWICE DAILY. NEED FOLLOW UP APPOINTMENT FOR ANYMORE REFILLS 07/01/23   Sabharwal, Aditya, DO  ezetimibe (ZETIA) 10 MG tablet Take 1 tablet (10 mg total) by mouth daily. 08/27/22   Swinyer, Zachary George, NP  fluticasone (FLOVENT HFA) 110 MCG/ACT inhaler Inhale 2 puffs into the lungs 2 (two) times daily. 11/21/22   Corwin Levins, MD  furosemide (LASIX) 40 MG tablet Take 1 tablet (40 mg total) by mouth as needed for fluid or edema. As needed for 3lb weight gain 11/08/22   Tonny Bollman, MD  gabapentin (NEURONTIN) 300 MG capsule Take 300 mg by  mouth daily.    [provider]  glucose blood (ONETOUCH VERIO) test strip Use as instructed four times per day E11.9 08/10/22   Corwin Levins, MD  isosorbide-hydrALAZINE (BIDIL) 20-37.5 MG tablet TAKE 1 TABLET BY MOUTH THREE TIMES DAILY 01/15/23   Swinyer, Zachary George, NP  Lancets MISC Use as directed four times per day E11.9 08/10/22   Corwin Levins, MD  latanoprost (XALATAN) 0.005 % ophthalmic solution 1 drop at bedtime. 12/24/22   [provider]  mexiletine (MEXITIL) 250 MG capsule Take 1 capsule (250 mg total) by mouth every 12 (twelve) hours. 09/14/22   Tonny Bollman, MD  nitroGLYCERIN (NITROSTAT) 0.4 MG SL tablet Place 1 tablet (0.4 mg total) under the tongue every 5  (five) minutes as needed for chest pain. N 08/27/22   Swinyer, Zachary George, NP  pantoprazole (PROTONIX) 40 MG tablet Take 1 tablet (40 mg total) by mouth daily. 02/02/21   Corwin Levins, MD  PARoxetine (PAXIL) 20 MG tablet TAKE 1 TABLET(20 MG) BY MOUTH DAILY 02/14/22   Corwin Levins, MD  spironolactone (ALDACTONE) 25 MG tablet Take 1 tablet (25 mg total) by mouth daily. 08/27/22   Swinyer, Zachary George, NP  tirzepatide [facility]) 5 MG/0.5ML Pen Inject 5 mg into the skin once a week. 01/25/23   Corwin Levins, MD      Allergies    Penicillin g, Sglt2 inhibitors, and Penicillins    Review of Systems   Review of Systems  Respiratory:  Positive for shortness of breath.     Physical Exam Updated Vital Signs BP 114/71   Pulse 60   Temp 97.6 F (36.4 C)   Resp 10   Ht 6\' 1"  (1.854 m)   Wt 124.7 kg   SpO2 100%   BMI 36.28 kg/m  Physical Exam Vitals and nursing note reviewed.  HENT:     Head: Normocephalic and atraumatic.     Mouth/Throat:     Mouth: Mucous membranes are moist.  Eyes:     General:        Right eye: No discharge.        Left eye: No discharge.     Conjunctiva/sclera: Conjunctivae normal.  Cardiovascular:     Rate and Rhythm: Normal rate and regular rhythm.     Pulses: Normal pulses.     Heart sounds: Normal heart sounds.  Pulmonary:     Effort: Pulmonary effort is normal.     Breath sounds: Normal breath sounds. No decreased breath sounds, wheezing, rhonchi or rales.  Abdominal:     General: Abdomen is flat.     Palpations: Abdomen is soft.  Skin:    General: Skin is warm and dry.  Neurological:     General: No focal deficit present.  Psychiatric:        Mood and Affect: Mood normal.     ED Results / Procedures / Treatments   Labs (all labs ordered are listed, but only abnormal results are displayed) Labs Reviewed  CBC WITH DIFFERENTIAL/PLATELET - Abnormal; Notable for the following components:      Result Value   RDW 16.0 (*)    All other components  within normal limits  BASIC METABOLIC PANEL - Abnormal; Notable for the following components:   BUN 43 (*)    Creatinine, Ser 2.44 (*)    GFR, Estimated 28 (*)    All other components within normal limits  TROPONIN I (HIGH SENSITIVITY) - Abnormal; Notable for the following  components:   Troponin I (High Sensitivity) 27 (*)    All other components within normal limits  TROPONIN I (HIGH SENSITIVITY) - Abnormal; Notable for the following components:   Troponin I (High Sensitivity) 22 (*)    All other components within normal limits  RESP PANEL BY RT-PCR (RSV, FLU A&B, COVID)  RVPGX2  BRAIN NATRIURETIC PEPTIDE    EKG EKG Interpretation Date/Time:  Saturday July 06 2023 08:51:55 EST Ventricular Rate:  60 PR Interval:  176 QRS Duration:  178 QT Interval:  496 QTC Calculation: 496 R Axis:   259  Text Interpretation: AV dual-paced rhythm Biventricular pacemaker detected No significant change since last tracing When compared with ECG of 03-Jan-2023 09:10, PREVIOUS ECG IS PRESENT Confirmed by Gwyneth Sprout ([id number]) on 07/06/2023 9:13:58 AM  Radiology DG Chest 2 View Result Date: 07/06/2023 CLINICAL DATA:  Cough.  Shortness of breath. EXAM: CHEST - 2 VIEW COMPARISON:  09/15/2022. FINDINGS: Bilateral lung fields are clear. Bilateral costophrenic angles are clear. Normal cardio-mediastinal silhouette. There is a left sided 3-lead pacemaker. No acute osseous abnormalities. The soft tissues are within normal limits. IMPRESSION: No active cardiopulmonary disease. Electronically Signed   By: Jules Schick M.D.   On: 07/06/2023 10:33    Procedures Procedures    Medications Ordered in ED Medications - No data to display  ED Course/ Medical Decision Making/ A&P                                 Medical Decision Making Amount and/or Complexity of Data Reviewed Labs: ordered. Radiology: ordered.   Initial Impression and Ddx 68 year old well-appearing male presenting for shortness of  breath, coughing and sneezing.  Exam was unremarkable.  DDx includes pneumonia, URI, ACS, PE, CHF exacerbation, arrhythmia, allergies, other. Patient PMH that increases complexity of ED encounter:  CHF with indwelling ICD, history of NSTEMI, TIA, type 2 diabetes, stage III CKD  Interpretation of Diagnostics - I independent reviewed and interpreted the labs as followed: mildly Reduced GFR  - I independently visualized the following imaging with scope of interpretation limited to determining acute life threatening conditions related to emergency care: CXR, which revealed no active disease  -I personally reviewed and interpreted EKG which revealed AV dual paced rhythm, nonischemic  Patient Reassessment and Ultimate Disposition/Management On reassessment, patient stated that shortness of breath and resolved without intervention.  Workup was largely unremarkable.  Doubt ACS, PE, CHF or arrhythmia.  Symptoms could be related to allergies.  Able to ambulate patient around the department as well and he was not short of breath and sats were 100%.  Advised him to follow-up with his PCP if his symptoms return.  Recommended Zyrtec or Claritin as well.  Discussed return precautions.  Vital stable.  Discharged in good condition.  Patient management required discussion with the following services or consulting groups:  None  Complexity of Problems Addressed Acute complicated illness or Injury  Additional Data Reviewed and Analyzed Further history obtained from: Past medical history and medications listed in the EMR, Prior ED visit notes, and Recent discharge summary  Patient Encounter Risk Assessment None         Final Clinical Impression(s) / ED Diagnoses Final diagnoses:  SOB (shortness of breath)    Rx / DC Orders ED Discharge Orders     None         Gareth Eagle, PA-C 07/06/23 1214    Gwyneth Sprout, MD  07/08/23 1456  

## 2023-07-06 NOTE — ED Triage Notes (Signed)
Complains of shortness of breath and feeling weak the last couple days.  Reports cough with small amount of sputum.  Denies leg swelling or chest pain.  Long cardiac hx Denies fever

## 2023-07-06 NOTE — Discharge Instructions (Addendum)
Evaluation today was overall reassuring.  Recommend that you follow with your PCP if your symptoms return.  Symptoms could be related to allergies.  In this case would recommend trying Claritin or Zyrtec.  If you have worsening shortness of breath, chest pain or any other concerning symptom please return to the emergency department for further evaluation.

## 2023-07-08 ENCOUNTER — Telehealth: Payer: Self-pay

## 2023-07-08 NOTE — Transitions of Care (Post Inpatient/ED Visit) (Signed)
   07/08/2023  Name: [name] [name] MRN: [id number] DOB: 11-Jun-1956  Today's TOC FU Call Status: Today's TOC FU Call Status:: Unsuccessful Call (1st Attempt) Unsuccessful Call (1st Attempt) Date: 07/08/23  Attempted to reach the patient regarding the most recent Inpatient/ED visit.  Follow Up Plan: Additional outreach attempts will be made to reach the patient to complete the Transitions of Care (Post Inpatient/ED visit) call.   Signature Karena Addison, LPN [facility] Nurse Health Advisor Direct Dial [phone]

## 2023-07-09 NOTE — Transitions of Care (Post Inpatient/ED Visit) (Signed)
   07/09/2023  Name: [name] [name] MRN: [id number] DOB: April 29, 1956  Today's TOC FU Call Status: Today's TOC FU Call Status:: Unsuccessful Call (2nd Attempt) Unsuccessful Call (1st Attempt) Date: 07/08/23 Unsuccessful Call (2nd Attempt) Date: 07/09/23  Attempted to reach the patient regarding the most recent Inpatient/ED visit.  Follow Up Plan: Additional outreach attempts will be made to reach the patient to complete the Transitions of Care (Post Inpatient/ED visit) call.   Signature Karena Addison, LPN [facility] Nurse Health Advisor Direct Dial [phone]

## 2023-07-15 NOTE — Transitions of Care (Post Inpatient/ED Visit) (Signed)
   07/15/2023  Name: [name] [name] MRN: [id number] DOB: Feb 10, 1956  Today's TOC FU Call Status: Today's TOC FU Call Status:: Unsuccessful Call (3rd Attempt) Unsuccessful Call (1st Attempt) Date: 07/08/23 Unsuccessful Call (2nd Attempt) Date: 07/09/23 Unsuccessful Call (3rd Attempt) Date: 07/15/23  Attempted to reach the patient regarding the most recent Inpatient/ED visit.  Follow Up Plan: No further outreach attempts will be made at this time. We have been unable to contact the patient.  Signature Karena Addison, LPN [facility] Nurse Health Advisor Direct Dial [phone]

## 2023-07-18 ENCOUNTER — Encounter (HOSPITAL_COMMUNITY): Payer: Self-pay | Admitting: Emergency Medicine

## 2023-07-18 ENCOUNTER — Emergency Department (HOSPITAL_COMMUNITY): Payer: 59

## 2023-07-18 ENCOUNTER — Emergency Department (HOSPITAL_COMMUNITY)
Admission: EM | Admit: 2023-07-18 | Discharge: 2023-07-18 | Disposition: A | Discharge status: Home / Self Care | Payer: 59 | Attending: Emergency Medicine | Admitting: Emergency Medicine

## 2023-07-18 DIAGNOSIS — Z7982 Long term (current) use of aspirin: Secondary | ICD-10-CM | POA: Insufficient documentation

## 2023-07-18 DIAGNOSIS — R059 Cough, unspecified: Secondary | ICD-10-CM | POA: Diagnosis present

## 2023-07-18 DIAGNOSIS — R0602 Shortness of breath: Secondary | ICD-10-CM | POA: Diagnosis not present

## 2023-07-18 DIAGNOSIS — I110 Hypertensive heart disease with heart failure: Secondary | ICD-10-CM | POA: Diagnosis not present

## 2023-07-18 DIAGNOSIS — R069 Unspecified abnormalities of breathing: Secondary | ICD-10-CM | POA: Diagnosis not present

## 2023-07-18 DIAGNOSIS — I509 Heart failure, unspecified: Secondary | ICD-10-CM | POA: Diagnosis not present

## 2023-07-18 DIAGNOSIS — U071 COVID-19: Secondary | ICD-10-CM | POA: Insufficient documentation

## 2023-07-18 DIAGNOSIS — I2510 Atherosclerotic heart disease of native coronary artery without angina pectoris: Secondary | ICD-10-CM | POA: Insufficient documentation

## 2023-07-18 DIAGNOSIS — R61 Generalized hyperhidrosis: Secondary | ICD-10-CM | POA: Diagnosis not present

## 2023-07-18 DIAGNOSIS — I700 Atherosclerosis of aorta: Secondary | ICD-10-CM | POA: Diagnosis not present

## 2023-07-18 DIAGNOSIS — E119 Type 2 diabetes mellitus without complications: Secondary | ICD-10-CM | POA: Insufficient documentation

## 2023-07-18 LAB — CBC WITH DIFFERENTIAL/PLATELET
Abs Immature Granulocytes: 0.02 10*3/uL (ref 0.00–0.07)
Basophils Absolute: 0 10*3/uL (ref 0.0–0.1)
Basophils Relative: 1 %
Eosinophils Absolute: 0.1 10*3/uL (ref 0.0–0.5)
Eosinophils Relative: 1 %
HCT: 45.3 % (ref 39.0–52.0)
Hemoglobin: 15.1 g/dL (ref 13.0–17.0)
Immature Granulocytes: 0 %
Lymphocytes Relative: 6 %
Lymphs Abs: 0.4 10*3/uL — ABNORMAL LOW (ref 0.7–4.0)
MCH: 29.2 pg (ref 26.0–34.0)
MCHC: 33.3 g/dL (ref 30.0–36.0)
MCV: 87.5 fL (ref 80.0–100.0)
Monocytes Absolute: 0.5 10*3/uL (ref 0.1–1.0)
Monocytes Relative: 7 %
Neutro Abs: 5.5 10*3/uL (ref 1.7–7.7)
Neutrophils Relative %: 85 %
Platelets: 201 10*3/uL (ref 150–400)
RBC: 5.18 MIL/uL (ref 4.22–5.81)
RDW: 15.9 % — ABNORMAL HIGH (ref 11.5–15.5)
WBC: 6.5 10*3/uL (ref 4.0–10.5)
nRBC: 0 % (ref 0.0–0.2)

## 2023-07-18 LAB — TROPONIN I (HIGH SENSITIVITY): Troponin I (High Sensitivity): 29 ng/L — ABNORMAL HIGH (ref <18)

## 2023-07-18 LAB — COMPREHENSIVE METABOLIC PANEL
ALT: 20 U/L (ref 0–44)
AST: 19 U/L (ref 15–41)
Albumin: 4 g/dL (ref 3.5–5.0)
Alkaline Phosphatase: 57 U/L (ref 38–126)
Anion gap: 11 (ref 5–15)
BUN: 25 mg/dL — ABNORMAL HIGH (ref 8–23)
CO2: 19 mmol/L — ABNORMAL LOW (ref 22–32)
Calcium: 9.9 mg/dL (ref 8.9–10.3)
Chloride: 109 mmol/L (ref 98–111)
Creatinine, Ser: 1.83 mg/dL — ABNORMAL HIGH (ref 0.61–1.24)
GFR, Estimated: 40 mL/min — ABNORMAL LOW (ref >60)
Glucose, Bld: 110 mg/dL — ABNORMAL HIGH (ref 70–99)
Potassium: 4.4 mmol/L (ref 3.5–5.1)
Sodium: 139 mmol/L (ref 135–145)
Total Bilirubin: 0.7 mg/dL (ref 0.0–1.2)
Total Protein: 7.2 g/dL (ref 6.5–8.1)

## 2023-07-18 LAB — RESP PANEL BY RT-PCR (RSV, FLU A&B, COVID)  RVPGX2
Influenza A by PCR: NEGATIVE
Influenza B by PCR: NEGATIVE
Resp Syncytial Virus by PCR: NEGATIVE
SARS Coronavirus 2 by RT PCR: POSITIVE — AB

## 2023-07-18 LAB — LIPASE, BLOOD: Lipase: 22 U/L (ref 11–51)

## 2023-07-18 MED ORDER — ONDANSETRON 4 MG PO TBDP: 4.0000 mg | ORAL_TABLET | Freq: Three times a day (TID) | ORAL | 0 refills | Status: DC | PRN

## 2023-07-18 MED ORDER — BENZONATATE 100 MG PO CAPS: 100.0000 mg | ORAL_CAPSULE | Freq: Three times a day (TID) | ORAL | 0 refills | Status: DC

## 2023-07-18 NOTE — Discharge Instructions (Signed)
 As we discussed, you tested positive for COVID today, which is likely the cause of your symptoms. As this is a viral illness, no antibiotics are indicated.   I recommend that you get plenty of rest and focus on symptomatic relief which includes Cepacol throat lozenges for sore throat, Mucinex for congestion, and tylenol  as needed for fevers and bodyaches. I have also given you a prescription for zofran  and tessalon . Zofran  is for nausea/vomiting and tessalon  is for cough.  Take these as prescribed as needed for management of your symptoms. I also recommend:  Increased fluid intake. Sports drinks offer valuable electrolytes, sugars, and fluids.  Breathing heated mist or steam (vaporizer or shower).  Eating chicken soup or other clear broths, and maintaining good nutrition.   Increasing usage of your inhaler if you have asthma.  Return to work when your temperature has returned to normal.  Gargle warm salt water and spit it out for sore throat. Take benadryl or Zyrtec  to decrease sinus secretions.  Follow Up: Follow up with your primary care doctor in 5-7 days for recheck of ongoing symptoms.  Return to emergency department for emergent changing or worsening of symptoms.

## 2023-07-18 NOTE — ED Notes (Signed)
 This RN reviewed discharge instructions with patient. He verbalized understanding and denied any further questions. PT well appearing upon discharge and reports headache Pt wheeled out to lobby and endorse ride home via cab.

## 2023-07-18 NOTE — ED Triage Notes (Signed)
 Pt called EMS for flu like symptoms for 2 days- SOB, cough. Vomiting x 3 this evening. Feels like diarrhea "could be coming on". CBG 105

## 2023-07-18 NOTE — ED Provider Triage Note (Signed)
 Emergency Medicine Provider Triage Evaluation Note  Kj Imbert , a 68 y.o. male  was evaluated in triage.  Pt complains of shortness of breath since 9 PM, 3 episodes of emesis since that time.  Patient also complains of cough for a few days.  Patient with significant cardiac history  Review of Systems  Positive:  Negative:   Physical Exam  BP (!) 174/111 (BP Location: Right Arm)   Pulse 87   Temp 99.1 F (37.3 C) (Oral)   Resp 17   SpO2 98%  Gen:   Awake, no distress   Resp:  Normal effort  MSK:   Moves extremities without difficulty  Other:    Medical Decision Making  Medically screening exam initiated at 6:08 AM.  Appropriate orders placed.  Aric Jost was informed that the remainder of the evaluation will be completed by another provider, this initial triage assessment does not replace that evaluation, and the importance of remaining in the ED until their evaluation is complete.     Logan Ubaldo NOV[place], [place] 07/18/23 [phone]

## 2023-07-18 NOTE — ED Provider Notes (Signed)
 [facility] Provider Note   CSN: [id number] Arrival date & time: 07/18/23  9480     History  Chief Complaint  Patient presents with   Shortness of Breath    [name] [name] is a 68 y.o. male.  Patient with history of CAD, CHF diabetes, GERD, stroke hypertension, MI, hyperlipidemia presents today with complaints of cough, congestion, and bodyaches.  He states that his symptoms began 2 days ago and have been persistent since then.  He notes that originally he was having nausea and vomiting as well as weakness and shortness of breath, however he states that in the last day he has started to feel some better.  He no longer is vomiting and is not short of breath.  Denies any chest pain.  No abdominal pain.  Unsure if he had a fever but did feel warm yesterday.  He has not tried anything for his symptoms.  Denies leg pain or leg swelling.  The history is provided by the patient. No language interpreter was used.  Shortness of Breath Associated symptoms: cough   Associated symptoms: no chest pain        Home Medications Prior to Admission medications   Medication Sig Start Date End Date Taking? Authorizing Provider  acetaminophen  (TYLENOL ) 500 MG tablet Take 1,000 mg by mouth every 6 (six) hours as needed for moderate pain.    [provider]  albuterol  (VENTOLIN  HFA) 108 (90 Base) MCG/ACT inhaler Inhale 2 puffs into the lungs every 6 (six) hours as needed for wheezing or shortness of breath. 11/21/22   Norleen Lynwood ORN, MD  Alcohol Swabs  (B-D SINGLE USE SWABS  BUTTERFLY) PADS Use up to 4 times a day to test blood sugars. Dx: E10.9, E11.9 09/30/20   Norleen Lynwood ORN, MD  amiodarone  (PACERONE ) 200 MG tablet Take 1 tablet (200 mg total) by mouth daily. 08/27/22   Swinyer, Rosaline HERO, NP  aspirin  EC 81 MG tablet Take 1 tablet (81 mg total) by mouth daily. Swallow whole. 08/27/22   Swinyer, Rosaline HERO, NP  atorvastatin  (LIPITOR ) 80 MG tablet Take 1  tablet (80 mg total) by mouth every evening. 08/27/22   Swinyer, Rosaline HERO, NP  Blood Glucose Calibration (TRUE METRIX LEVEL 1) Low SOLN Use as needed. Dx: E10.9, E11.9 07/27/22   Norleen Lynwood ORN, MD  Blood Glucose Monitoring Suppl (ONETOUCH VERIO FLEX SYSTEM) DEVI 1 Device by Does not apply route in the morning, at noon, in the evening, and at bedtime. E11.9 08/10/22   Norleen Lynwood ORN, MD  carvedilol  (COREG ) 6.25 MG tablet Take 1 tablet (6.25 mg total) by mouth 2 (two) times daily with a meal. 08/27/22   Swinyer, Rosaline HERO, NP  clopidogrel  (PLAVIX ) 75 MG tablet Take 1 tablet (75 mg total) by mouth daily. 08/27/22   Swinyer, Rosaline HERO, NP  dorzolamide-timolol (COSOPT) 2-0.5 % ophthalmic solution Place 1 drop into both eyes See admin instructions. Instill one drop into both eyes twice in the morning and once at night 03/12/22   [provider]  ENTRESTO  97-103 MG TAKE 1 TABLET BY MOUTH TWICE DAILY. NEED FOLLOW UP APPOINTMENT FOR ANYMORE REFILLS 07/01/23   Sabharwal, Aditya, DO  ezetimibe  (ZETIA ) 10 MG tablet Take 1 tablet (10 mg total) by mouth daily. 08/27/22   Swinyer, Rosaline HERO, NP  fluticasone  (FLOVENT  HFA) 110 MCG/ACT inhaler Inhale 2 puffs into the lungs 2 (two) times daily. 11/21/22   Norleen Lynwood ORN, MD  furosemide  (LASIX ) 40 MG  tablet Take 1 tablet (40 mg total) by mouth as needed for fluid or edema. As needed for 3lb weight gain 11/08/22   Wonda Sharper, MD  gabapentin  (NEURONTIN ) 300 MG capsule Take 300 mg by mouth daily.    [provider]  glucose blood (ONETOUCH VERIO) test strip Use as instructed four times per day E11.9 08/10/22   Norleen Lynwood ORN, MD  isosorbide -hydrALAZINE  (BIDIL ) 20-37.5 MG tablet TAKE 1 TABLET BY MOUTH THREE TIMES DAILY 01/15/23   Swinyer, Rosaline HERO, NP  Lancets MISC Use as directed four times per day E11.9 08/10/22   Norleen Lynwood ORN, MD  latanoprost (XALATAN) 0.005 % ophthalmic solution 1 drop at bedtime. 12/24/22   [provider]  mexiletine (MEXITIL ) 250 MG  capsule Take 1 capsule (250 mg total) by mouth every 12 (twelve) hours. 09/14/22   Wonda Sharper, MD  nitroGLYCERIN  (NITROSTAT ) 0.4 MG SL tablet Place 1 tablet (0.4 mg total) under the tongue every 5 (five) minutes as needed for chest pain. N 08/27/22   Swinyer, Rosaline HERO, NP  pantoprazole  (PROTONIX ) 40 MG tablet Take 1 tablet (40 mg total) by mouth daily. 02/02/21   Norleen Lynwood ORN, MD  PARoxetine  (PAXIL ) 20 MG tablet TAKE 1 TABLET(20 MG) BY MOUTH DAILY 02/14/22   Norleen Lynwood ORN, MD  spironolactone  (ALDACTONE ) 25 MG tablet Take 1 tablet (25 mg total) by mouth daily. 08/27/22   Swinyer, Rosaline HERO, NP  tirzepatide  (MOUNJARO ) 5 MG/0.5ML Pen Inject 5 mg into the skin once a week. 01/25/23   Norleen Lynwood ORN, MD      Allergies    Penicillin g, Sglt2 inhibitors, and Penicillins    Review of Systems   Review of Systems  HENT:  Positive for congestion.   Respiratory:  Positive for cough and shortness of breath (resolved).   Cardiovascular:  Negative for chest pain.  All other systems reviewed and are negative.   Physical Exam Updated Vital Signs BP (!) 174/111 (BP Location: Right Arm)   Pulse 87   Temp 99.1 F (37.3 C) (Oral)   Resp 17   SpO2 98%  Physical Exam Vitals and nursing note reviewed.  Constitutional:      General: He is not in acute distress.    Appearance: Normal appearance. He is normal weight. He is not ill-appearing, toxic-appearing or diaphoretic.  HENT:     Head: Normocephalic and atraumatic.  Cardiovascular:     Rate and Rhythm: Normal rate and regular rhythm.     Heart sounds: Normal heart sounds.  Pulmonary:     Effort: Pulmonary effort is normal. No respiratory distress.     Breath sounds: Normal breath sounds.     Comments: Speaking in complete sentences Abdominal:     Palpations: Abdomen is soft.     Tenderness: There is no abdominal tenderness.  Musculoskeletal:        General: Normal range of motion.     Cervical back: Normal range of motion.     Right lower  leg: No tenderness. No edema.     Left lower leg: No tenderness. No edema.  Skin:    General: Skin is warm and dry.  Neurological:     General: No focal deficit present.     Mental Status: He is alert.  Psychiatric:        Mood and Affect: Mood normal.        Behavior: Behavior normal.     ED Results / Procedures / Treatments   Labs (all  labs ordered are listed, but only abnormal results are displayed) Labs Reviewed  RESP PANEL BY RT-PCR (RSV, FLU A&B, COVID)  RVPGX2 - Abnormal; Notable for the following components:      Result Value   SARS Coronavirus 2 by RT PCR POSITIVE (*)    All other components within normal limits  COMPREHENSIVE METABOLIC PANEL - Abnormal; Notable for the following components:   CO2 19 (*)    Glucose, Bld 110 (*)    BUN 25 (*)    Creatinine, Ser 1.83 (*)    GFR, Estimated 40 (*)    All other components within normal limits  CBC WITH DIFFERENTIAL/PLATELET - Abnormal; Notable for the following components:   RDW 15.9 (*)    Lymphs Abs 0.4 (*)    All other components within normal limits  TROPONIN I (HIGH SENSITIVITY) - Abnormal; Notable for the following components:   Troponin I (High Sensitivity) 29 (*)    All other components within normal limits  LIPASE, BLOOD  URINALYSIS, ROUTINE W REFLEX MICROSCOPIC  TROPONIN I (HIGH SENSITIVITY)    EKG EKG Interpretation Date/Time:  Thursday July 18 2023 06:06:14 EST Ventricular Rate:  82 PR Interval:  193 QRS Duration:  159 QT Interval:  442 QTC Calculation: 517 R Axis:   251  Text Interpretation: Atrial-sensed ventricular-paced rhythm No further analysis attempted due to paced rhythm When compared with ECG of 07/06/2023, Atrial-sensed ventricular-paced rhythm has replaced AV dual-paced rhythm Confirmed by Raford Lenis ([id number]) on 07/18/2023 6:13:58 AM  Radiology DG Chest 2 View Result Date: 07/18/2023 CLINICAL DATA:  Shortness of breath. EXAM: CHEST - 2 VIEW COMPARISON:  PA Lat chest 07/06/2023.  FINDINGS: The heart size and mediastinal contours are within normal limits. There is mild aortic atherosclerosis. Left chest 3 lead pacing system including AID wiring and wire insertions are unaltered. Both lungs are clear. The visualized skeletal structures are intact, with thoracic spondylosis and bridging enthesopathy. IMPRESSION: No evidence of acute chest disease. Aortic atherosclerosis. Electronically Signed   By: Francis Quam M.D.   On: 07/18/2023 06:58    Procedures Procedures    Medications Ordered in ED Medications - No data to display  ED Course/ Medical Decision Making/ A&P                                 Medical Decision Making Amount and/or Complexity of Data Reviewed Labs: ordered. Radiology: ordered.   This patient is a 68 y.o. male who presents to the ED for concern of cough and congestion, this involves an extensive number of treatment options, and is a complaint that carries with it a high risk of complications and morbidity. The emergent differential diagnosis prior to evaluation includes, but is not limited to,  URI, CHF, pericardial effusion/tamponade, arrhythmias, ACS, COPD, asthma, bronchitis, pneumonia, pneumothorax, PE, anemia    This is not an exhaustive differential.   Past Medical History / Co-morbidities / Social History:  has a past medical history of CAD (coronary artery disease), Combined systolic and diastolic CHF, Depression, Diabetes mellitus (HCC), Gastroesophageal reflux disease, History of nuclear stress test, History of pneumonia, History of stroke (01/2021), HTN (hypertension), Hx of NSTEMI, Hyperlipidemia, Ischemic cardiomyopathy, Obesity, OSA (obstructive sleep apnea), and Tobacco abuse.  Additional history: Chart reviewed.  Physical Exam: Physical exam performed. The pertinent findings include: No acute physical exam abnormalities  Lab Tests: I ordered, and personally interpreted labs.  The pertinent results include: COVID +.  No  leukocytosis or anemia.  Bicarb 19.  BUN 25, creatinine 1.83 improved from previous.  Troponin 29 consistent with previous.   Imaging Studies: I ordered imaging studies including CXR. I independently visualized and interpreted imaging which showed NAD. I agree with the radiologist interpretation.   Cardiac Monitoring:  The patient was maintained on a cardiac monitor. Cardiac monitor showed an underlying rhythm of: paced rhythm, no STEMI. I agree with this interpretation.   Disposition: After consideration of the diagnostic results and the patients response to treatment, I feel that emergency department workup does not suggest an emergent condition requiring admission or immediate intervention beyond what has been performed at this time. The plan is: Discharge with Tessalon  for cough and Zofran  for nausea and vomiting.  Patient positive for COVID, symptoms consistent with same.  His labs are otherwise reassuring. He is overall well appearing speaking in complete sentences without any shortness of breath or chest pain. He is able to eat and drink without any residual nausea or vomiting. No signs or symptoms to suggest ACS/PE. Evaluation and diagnostic testing in the emergency department does not suggest an emergent condition requiring admission or immediate intervention beyond what has been performed at this time.  Plan for discharge with close PCP follow-up.  Patient is understanding and amenable with plan, educated on red flag symptoms that would prompt immediate return.  Patient discharged in stable condition.  Final Clinical Impression(s) / ED Diagnoses Final diagnoses:  COVID    Rx / DC Orders ED Discharge Orders          Ordered    ondansetron  (ZOFRAN -ODT) 4 MG disintegrating tablet  Every 8 hours PRN        07/18/23 0828    benzonatate  (TESSALON ) 100 MG capsule  Every 8 hours        07/18/23 9171          An After Visit Summary was printed and given to the patient.     Joriel Streety,  Julus Kelley A, PA-C 07/18/23 MARNIE    Randol Simmonds, MD 07/19/23 [phone]

## 2023-07-29 ENCOUNTER — Ambulatory Visit: Payer: 59 | Admitting: Internal Medicine

## 2023-07-30 ENCOUNTER — Other Ambulatory Visit: Payer: Self-pay | Admitting: Cardiovascular Disease

## 2023-08-01 ENCOUNTER — Ambulatory Visit: Payer: 59 | Admitting: Podiatry

## 2023-08-01 ENCOUNTER — Other Ambulatory Visit: Payer: Self-pay | Admitting: Cardiovascular Disease

## 2023-08-01 NOTE — Telephone Encounter (Signed)
 Refill request

## 2023-08-02 ENCOUNTER — Ambulatory Visit: Payer: 59 | Admitting: Podiatry

## 2023-08-02 ENCOUNTER — Encounter: Payer: Self-pay | Admitting: Podiatry

## 2023-08-02 VITALS — Ht 73.0 in | Wt 275.0 lb

## 2023-08-02 DIAGNOSIS — E1159 Type 2 diabetes mellitus with other circulatory complications: Secondary | ICD-10-CM | POA: Diagnosis not present

## 2023-08-02 DIAGNOSIS — L84 Corns and callosities: Secondary | ICD-10-CM

## 2023-08-02 DIAGNOSIS — B351 Tinea unguium: Secondary | ICD-10-CM | POA: Diagnosis not present

## 2023-08-02 DIAGNOSIS — M79675 Pain in left toe(s): Secondary | ICD-10-CM

## 2023-08-02 DIAGNOSIS — M79674 Pain in right toe(s): Secondary | ICD-10-CM | POA: Diagnosis not present

## 2023-08-02 NOTE — Progress Notes (Signed)
This patient returns to my office for at risk foot care.  This patient requires this care by a professional since this patient will be at risk due to having pre-ulcerative calluses, CKD and diabetic neuropathy and coagulation defect.  Patient is taking plavix.  This patient is unable to cut nails himself since the patient cannot reach his nails.These nails are painful walking and wearing shoes.  He also has callus on his right forefoot and heel. This patient presents for at risk foot care today.    General Appearance  Alert, conversant and in no acute stress.  Vascular  Dorsalis pedis and posterior tibial  pulses are  weakly  palpable  bilaterally.  Capillary return is within normal limits  bilaterally. Temperature is within normal limits  bilaterally.  Neurologic  Senn-Weinstein monofilament wire test diminished   bilaterally. Muscle power within normal limits bilaterally.  Nails Thick disfigured discolored nails with subungual debris  from hallux to fifth toes bilaterally. No evidence of bacterial infection or drainage bilaterally.  Orthopedic  No limitations of motion  feet .  No crepitus or effusions noted.  No bony pathology or digital deformities noted.  Plantar flexed second metatarsal  B/L.  Skin  normotropic skin  noted bilaterally.  No signs of infections or ulcers noted.   Pre-ulcerous callus sub 2  B/L symptomatic,  Callus sub 5 left foot.  Onychomycosis  Pain in right toes  Pain in left toes  Porokeratosis/Pre-ulcerous callus  B/L.  Consent was obtained for treatment procedures.   Mechanical debridement of nails 1-5  bilaterally performed with a nail nipper.  Filed with dremel without incident.  Debride pre-ulcerous calluses with # 15 blade.  Debride  callus with # 15 blade and dremel tool.   Return office visit     10 weeks               Told patient to return for periodic foot care and evaluation due to potential at risk complications.   Helane Gunther DPM

## 2023-08-06 ENCOUNTER — Encounter: Payer: Self-pay | Admitting: Internal Medicine

## 2023-08-06 ENCOUNTER — Ambulatory Visit: Payer: 59 | Admitting: Internal Medicine

## 2023-08-06 VITALS — BP 128/82 | HR 60 | Temp 98.1°F | Ht 73.0 in | Wt 254.0 lb

## 2023-08-06 DIAGNOSIS — Z23 Encounter for immunization: Secondary | ICD-10-CM

## 2023-08-06 DIAGNOSIS — E782 Mixed hyperlipidemia: Secondary | ICD-10-CM

## 2023-08-06 DIAGNOSIS — Z0000 Encounter for general adult medical examination without abnormal findings: Secondary | ICD-10-CM | POA: Diagnosis not present

## 2023-08-06 DIAGNOSIS — I5042 Chronic combined systolic (congestive) and diastolic (congestive) heart failure: Secondary | ICD-10-CM | POA: Diagnosis not present

## 2023-08-06 DIAGNOSIS — I10 Essential (primary) hypertension: Secondary | ICD-10-CM

## 2023-08-06 DIAGNOSIS — Z7985 Long-term (current) use of injectable non-insulin antidiabetic drugs: Secondary | ICD-10-CM

## 2023-08-06 DIAGNOSIS — E538 Deficiency of other specified B group vitamins: Secondary | ICD-10-CM | POA: Diagnosis not present

## 2023-08-06 DIAGNOSIS — E1159 Type 2 diabetes mellitus with other circulatory complications: Secondary | ICD-10-CM | POA: Diagnosis not present

## 2023-08-06 DIAGNOSIS — E559 Vitamin D deficiency, unspecified: Secondary | ICD-10-CM

## 2023-08-06 DIAGNOSIS — Z125 Encounter for screening for malignant neoplasm of prostate: Secondary | ICD-10-CM | POA: Diagnosis not present

## 2023-08-06 DIAGNOSIS — Z1211 Encounter for screening for malignant neoplasm of colon: Secondary | ICD-10-CM

## 2023-08-06 DIAGNOSIS — Z0001 Encounter for general adult medical examination with abnormal findings: Secondary | ICD-10-CM

## 2023-08-06 LAB — BASIC METABOLIC PANEL
BUN: 20 mg/dL (ref 6–23)
CO2: 27 meq/L (ref 19–32)
Calcium: 9.7 mg/dL (ref 8.4–10.5)
Chloride: 109 meq/L (ref 96–112)
Creatinine, Ser: 1.68 mg/dL — ABNORMAL HIGH (ref 0.40–1.50)
GFR: 42.09 mL/min — ABNORMAL LOW (ref >60.00)
Glucose, Bld: 90 mg/dL (ref 70–99)
Potassium: 4.8 meq/L (ref 3.5–5.1)
Sodium: 142 meq/L (ref 135–145)

## 2023-08-06 LAB — LIPID PANEL
Cholesterol: 108 mg/dL (ref 0–200)
HDL: 50.2 mg/dL (ref >39.00)
LDL Cholesterol: 51 mg/dL (ref 0–99)
NonHDL: 57.73
Total CHOL/HDL Ratio: 2
Triglycerides: 33 mg/dL (ref 0.0–149.0)
VLDL: 6.6 mg/dL (ref 0.0–40.0)

## 2023-08-06 LAB — CBC WITH DIFFERENTIAL/PLATELET
Basophils Absolute: 0 10*3/uL (ref 0.0–0.1)
Basophils Relative: 0.8 % (ref 0.0–3.0)
Eosinophils Absolute: 0.2 10*3/uL (ref 0.0–0.7)
Eosinophils Relative: 4.2 % (ref 0.0–5.0)
HCT: 42 % (ref 39.0–52.0)
Hemoglobin: 13.8 g/dL (ref 13.0–17.0)
Lymphocytes Relative: 44 % (ref 12.0–46.0)
Lymphs Abs: 2.1 10*3/uL (ref 0.7–4.0)
MCHC: 33 g/dL (ref 30.0–36.0)
MCV: 88.9 fL (ref 78.0–100.0)
Monocytes Absolute: 0.6 10*3/uL (ref 0.1–1.0)
Monocytes Relative: 11.7 % (ref 3.0–12.0)
Neutro Abs: 1.9 10*3/uL (ref 1.4–7.7)
Neutrophils Relative %: 39.3 % — ABNORMAL LOW (ref 43.0–77.0)
Platelets: 240 10*3/uL (ref 150.0–400.0)
RBC: 4.72 Mil/uL (ref 4.22–5.81)
RDW: 15.9 % — ABNORMAL HIGH (ref 11.5–15.5)
WBC: 4.8 10*3/uL (ref 4.0–10.5)

## 2023-08-06 LAB — VITAMIN B12: Vitamin B-12: 389 pg/mL (ref 211–911)

## 2023-08-06 LAB — PSA: PSA: 1.18 ng/mL (ref 0.10–4.00)

## 2023-08-06 LAB — VITAMIN D 25 HYDROXY (VIT D DEFICIENCY, FRACTURES): VITD: 30.44 ng/mL (ref 30.00–100.00)

## 2023-08-06 LAB — HEMOGLOBIN A1C: Hgb A1c MFr Bld: 6.2 % (ref 4.6–6.5)

## 2023-08-06 LAB — HEPATIC FUNCTION PANEL
ALT: 17 U/L (ref 0–53)
AST: 14 U/L (ref 0–37)
Albumin: 4.2 g/dL (ref 3.5–5.2)
Alkaline Phosphatase: 52 U/L (ref 39–117)
Bilirubin, Direct: 0.2 mg/dL (ref 0.0–0.3)
Total Bilirubin: 0.6 mg/dL (ref 0.2–1.2)
Total Protein: 7 g/dL (ref 6.0–8.3)

## 2023-08-06 LAB — TSH: TSH: 1.02 u[IU]/mL (ref 0.35–5.50)

## 2023-08-06 MED ORDER — EZETIMIBE 10 MG PO TABS: 10.0000 mg | ORAL_TABLET | Freq: Every day | ORAL | 3 refills | Status: AC

## 2023-08-06 MED ORDER — ATORVASTATIN CALCIUM 80 MG PO TABS: 80.0000 mg | ORAL_TABLET | Freq: Every evening | ORAL | 3 refills | Status: AC

## 2023-08-06 MED ORDER — TIRZEPATIDE 5 MG/0.5ML ~~LOC~~ SOAJ: 5.0000 mg | SUBCUTANEOUS | 3 refills | Status: DC

## 2023-08-06 MED ORDER — ALBUTEROL SULFATE HFA 108 (90 BASE) MCG/ACT IN AERS
2.0000 | INHALATION_SPRAY | Freq: Four times a day (QID) | RESPIRATORY_TRACT | 11 refills | Status: AC | PRN
Start: 2023-08-06 — End: ?

## 2023-08-06 NOTE — Progress Notes (Addendum)
 Patient ID: [name] [name], male   DOB: May 27, 1956, 68 y.o.   MRN: [id number]         Chief Complaint:: wellness exam and recent covid now wt loss and fatigue weakness, dm, htn, hld, low vit d and b12       HPI:  [name] [name] is a 68 y.o. male here for wellness exam; for flu shot, for colonoscopy, declines shingrix and tdap for pharmacy, o/w up to date                Also has some DOE after covid last wk, with reduced po intake, contd diuretic use, wt loss, and fatigue, weakness; Pt denies chest pain, wheezing, orthopnea, PND, increased LE swelling, palpitations, or syncope.   Pt denies polydipsia, polyuria, or new focal neuro s/s.    Pt denies fever, night sweats, loss of appetite, or other constitutional symptoms     Wt Readings from Last 3 Encounters:  08/06/23 254 lb (115.2 kg)  08/02/23 275 lb (124.7 kg)  07/06/23 275 lb (124.7 kg)   BP Readings from Last 3 Encounters:  08/06/23 128/82  07/18/23 (!) 124/111  07/06/23 114/71   Immunization History  Administered Date(s) Administered   Fluad Quad(high Dose 65+) 02/22/2022   Fluad Trivalent(High Dose 65+) 08/06/2023   Influenza Whole 04/12/2008, 04/12/2010, 02/10/2011   Influenza, Seasonal, Injecte, Preservative Fre 03/11/2013   Influenza,inj,Quad PF,6+ Mos 02/12/2015, 05/04/2016, 03/29/2017, 05/05/2018, 05/15/2019, 03/22/2020, 07/12/2021   Moderna Sars-Covid-2 Vaccination 11/03/2019, 12/01/2019, 06/20/2020   PFIZER(Purple Top)SARS-COV-2 Vaccination 11/10/2019   Pfizer(Comirnaty)Fall Seasonal Vaccine 12 years and older 04/17/2022   Pneumococcal Conjugate-13 06/26/2013   Pneumococcal Polysaccharide-23 07/21/2009, 06/27/2015, 07/27/2022   Td 03/24/2009   Zoster Recombinant(Shingrix) 04/02/2017   Health Maintenance Due  Topic Date Due   Zoster Vaccines- Shingrix (2 of 2) 05/28/2017   DTaP/Tdap/Td (2 - Tdap) 03/25/2019   Colonoscopy  03/17/2022   Diabetic kidney evaluation - Urine ACR  07/28/2023      Past Medical History:   Diagnosis Date   CAD (coronary artery disease)    a. s/p BMS pRCA 2002; b. DES to pRCA & DES p/m RCA 2006; c. PCI/DES OM4 2007; d. PCI/CBA to RCA for ISR 10/2006; e.08/2012 STEMI/Cath/PCI:  LAD 95% >> PCI: Promus Prem DES  //  f. NSTEMI 10/15 >> LHC: mLAD stent ok, dLAD 70, OM1 CTO, OM2 stent ok, dOM2 90, OM3 30-40, dLCx 90, p-mRCA stent ok, mRCA stent ok w/ 60-70 ISR, EF 50% >> med Rx // MV 1/17: no ischemia // Cath ([facility]) 05/2019: RCA 100 (CTO)    Combined systolic and diastolic CHF    a. Myoview 1/17: EF 35%  //  b. Echo 1/17 EF 45-50% // Echo 05/2019: EF 50-55 // Echocardiogram 8/21: EF 50, inf AK, post and mid inf HK, mod LVH, RVSP 25.3, mild to mod LAE, trivial MR    Depression    Diabetes mellitus (HCC)    a. A1c 8.8 08/2012->Metformin initiated. => b. A1c (9/14): 6.6   Gastroesophageal reflux disease    History of nuclear stress test    a. Myoview 1/17: EF 35%, fixed inferior lateral defect consistent with infarct, no ischemia, intermediate risk   History of pneumonia    History of stroke 01/2021   Zio Monitor 11/22: NSR, avg HR 61; PVCs (1.9%); no AFib, no high grade HB, no ventricular arrhythmias   HTN (hypertension)    Hx of NSTEMI    Hyperlipidemia    Ischemic cardiomyopathy    a. EF  40%; improved to normal;  b. 08/2012 Echo: EF 50-55%, mod LVH.//  c. Echo 9/16: Inf HK, mild LVH, EF 55%, mild LAE, normal RVF, mild RAE, PASP 35 mmHg  //  d. Echo 1/17: EF 45-50%, inferior HK, mild BAE, PASP 33 mmHg   Obesity    OSA (obstructive sleep apnea)    Does not use CPAP as of 05/2011   Tobacco abuse    Past Surgical History:  Procedure Laterality Date   BIV ICD INSERTION CRT-D N/A 08/14/2022   Procedure: BIV ICD INSERTION CRT-D;  Surgeon: Maurice Small, MD;  Location: [facility] INVAS[facility];  Service: Cardiovascular;  Laterality: N/A;   CORONARY ANGIOPLASTY WITH STENT PLACEMENT     CORONARY STENT PLACEMENT  2009   LEFT HEART CATH AND CORONARY ANGIOGRAPHY N/A 08/13/2022   Procedure: LEFT  HEART CATH AND CORONARY ANGIOGRAPHY;  Surgeon: Yvonne Kendall, MD;  Location: [facility]AS[facility] CV [facility];  Service: Cardiovascular;  Laterality: N/A;   LEFT HEART CATHETERIZATION WITH CORONARY ANGIOGRAM N/A 08/31/2012   Procedure: LEFT HEART CATHETERIZATION WITH CORONARY ANGIOGRAM;  Surgeon: Kathleene Hazel, MD;  Location: [facility] CATH LAB;  Service: Cardiovascular;  Laterality: N/A;   LEFT HEART CATHETERIZATION WITH CORONARY ANGIOGRAM N/A 11/16/2013   Procedure: LEFT HEART CATHETERIZATION WITH CORONARY ANGIOGRAM;  Surgeon: Micheline Chapman, MD;  Location: [facility] CATH LAB;  Service: Cardiovascular;  Laterality: N/A;   LEFT HEART CATHETERIZATION WITH CORONARY ANGIOGRAM N/A 03/15/2014   Procedure: LEFT HEART CATHETERIZATION WITH CORONARY ANGIOGRAM;  Surgeon: Peter M Swaziland, MD;  Location: [facility] CATH LAB;  Service: Cardiovascular;  Laterality: N/A;   PERCUTANEOUS CORONARY STENT INTERVENTION (PCI-S) Right 08/31/2012   Procedure: PERCUTANEOUS CORONARY STENT INTERVENTION (PCI-S);  Surgeon: Kathleene Hazel, MD;  Location: [facility] CATH LAB;  Service: Cardiovascular;  Laterality: Right;   PERCUTANEOUS CORONARY STENT INTERVENTION (PCI-S)  11/16/2013   Procedure: PERCUTANEOUS CORONARY STENT INTERVENTION (PCI-S);  Surgeon: Micheline Chapman, MD;  Location: [facility] CATH LAB;  Service: Cardiovascular;;    reports that he has quit smoking. His smoking use included cigarettes. He has a 15 pack-year smoking history. He has never used smokeless tobacco. He reports that he does not drink alcohol and does not use drugs. family history includes Cancer in his mother; Coronary artery disease in an other family member; Depression in his mother; Hypertension in his mother; Other in his father. Allergies  Allergen Reactions   Penicillin G Other (See Comments)    Did it involve swelling of the face/tongue/throat, SOB, or low BP?  N/A Did it involve sudden or severe rash/hives, skin peeling, or any reaction on the inside of your mouth or nose?  N/A Did you need to seek medical attention at a hospital or doctor's office? N/A When did it last happen? Child If all above answers are "NO", may proceed with cephalosporin use.   Sglt2 Inhibitors Other (See Comments)    Hospn April 2024 with UTI, DKA   Penicillins Other (See Comments)    Did it involve swelling of the face/tongue/throat, SOB, or low BP? N/A Did it involve sudden or severe rash/hives, skin peeling, or any reaction on the inside of your mouth or nose?N/A Did you need to seek medical attention at a hospital or doctor's office? N/A When did it last happen? Child      If all above answers are "NO", may proceed with cephalosporin use.   Current Outpatient Medications on File Prior to Visit  Medication Sig Dispense Refill   acetaminophen (TYLENOL) 500 MG  tablet Take 1,000 mg by mouth every 6 (six) hours as needed for moderate pain.     Alcohol Swabs (B-D SINGLE USE SWABS BUTTERFLY) PADS Use up to 4 times a day to test blood sugars. Dx: E10.9, E11.9 100 each 11   amiodarone (PACERONE) 200 MG tablet Take 1 tablet (200 mg total) by mouth daily. 90 tablet 3   aspirin EC 81 MG tablet Take 1 tablet (81 mg total) by mouth daily. Swallow whole. 90 tablet 3   Blood Glucose Calibration (TRUE METRIX LEVEL 1) Low SOLN Use as needed. Dx: E10.9, E11.9 1 each 0   Blood Glucose Monitoring Suppl (ONETOUCH VERIO FLEX SYSTEM) DEVI 1 Device by Does not apply route in the morning, at noon, in the evening, and at bedtime. E11.9 1 each 0   carvedilol (COREG) 6.25 MG tablet Take 1 tablet (6.25 mg total) by mouth 2 (two) times daily with a meal. 180 tablet 3   clopidogrel (PLAVIX) 75 MG tablet Take 1 tablet (75 mg total) by mouth daily. 90 tablet 3   dorzolamide-timolol (COSOPT) 2-0.5 % ophthalmic solution Place 1 drop into both eyes See admin instructions. Instill one drop into both eyes twice in the morning and once at night     ENTRESTO 97-103 MG TAKE 1 TABLET BY MOUTH TWICE DAILY. NEED FOLLOW UP  APPOINTMENT FOR ANYMORE REFILLS 60 tablet 2   fluticasone (FLOVENT HFA) 110 MCG/ACT inhaler Inhale 2 puffs into the lungs 2 (two) times daily. 1 each 12   furosemide (LASIX) 40 MG tablet Take 1 tablet (40 mg total) by mouth as needed for fluid or edema. As needed for 3lb weight gain 30 tablet 10   glucose blood (ONETOUCH VERIO) test strip Use as instructed four times per day E11.9 400 each 12   isosorbide-hydrALAZINE (BIDIL) 20-37.5 MG tablet TAKE 1 TABLET BY MOUTH THREE TIMES DAILY 270 tablet 2   Lancets MISC Use as directed four times per day E11.9 400 each 3   latanoprost (XALATAN) 0.005 % ophthalmic solution 1 drop at bedtime.     mexiletine (MEXITIL) 250 MG capsule TAKE 1 CAPSULE(250 MG) BY MOUTH EVERY 12 HOURS 60 capsule 0   nitroGLYCERIN (NITROSTAT) 0.4 MG SL tablet Place 1 tablet (0.4 mg total) under the tongue every 5 (five) minutes as needed for chest pain. N 25 tablet 3   spironolactone (ALDACTONE) 25 MG tablet Take 1 tablet (25 mg total) by mouth daily. 90 tablet 3   No current facility-administered medications on file prior to visit.        ROS:  All others reviewed and negative.  Objective        PE:  BP 128/82 (BP Location: Right Arm, Patient Position: Sitting, Cuff Size: Normal)   Pulse 60   Temp 98.1 F (36.7 C) (Oral)   Ht 6\' 1"  (1.854 m)   Wt 254 lb (115.2 kg)   SpO2 98%   BMI 33.51 kg/m                 Constitutional: Pt appears in NAD               HENT: Head: NCAT.                Right Ear: External ear normal.                 Left Ear: External ear normal.                Eyes: .  Pupils are equal, round, and reactive to light. Conjunctivae and EOM are normal               Nose: without d/c or deformity               Neck: Neck supple. Gross normal ROM               Cardiovascular: Normal rate and regular rhythm.                 Pulmonary/Chest: Effort normal and breath sounds without rales or wheezing.                Abd:  Soft, NT, ND, + BS, no  organomegaly               Neurological: Pt is alert. At baseline orientation, motor grossly intact               Skin: Skin is warm. No rashes, no other new lesions, LE edema - none               Psychiatric: Pt behavior is normal without agitation   Micro: none  Cardiac tracings I have personally interpreted today:  none  Pertinent Radiological findings (summarize): none   Lab Results  Component Value Date   WBC 4.8 08/06/2023   HGB 13.8 08/06/2023   HCT 42.0 08/06/2023   PLT 240.0 08/06/2023   GLUCOSE 90 08/06/2023   CHOL 108 08/06/2023   TRIG 33.0 08/06/2023   HDL 50.20 08/06/2023   LDLCALC 51 08/06/2023   ALT 17 08/06/2023   AST 14 08/06/2023   NA 142 08/06/2023   K 4.8 08/06/2023   CL 109 08/06/2023   CREATININE 1.68 (H) 08/06/2023   BUN 20 08/06/2023   CO2 27 08/06/2023   TSH 1.02 08/06/2023   PSA 1.18 08/06/2023   INR 1.0 09/15/2022   HGBA1C 6.2 08/06/2023   MICROALBUR 2.9 (H) 07/27/2022   Assessment/Plan:  [name] [name] is a 68 y.o. Black or African American [2] male with  has a past medical history of CAD (coronary artery disease), Combined systolic and diastolic CHF, Depression, Diabetes mellitus (HCC), Gastroesophageal reflux disease, History of nuclear stress test, History of pneumonia, History of stroke (01/2021), HTN (hypertension), Hx of NSTEMI, Hyperlipidemia, Ischemic cardiomyopathy, Obesity, OSA (obstructive sleep apnea), and Tobacco abuse.  Encounter for well adult exam with abnormal findings Age and sex appropriate education and counseling updated with regular exercise and diet Referrals for preventative services - for colonoscopy Immunizations addressed - for flu shot, for shingrix and tdap for pharmacy Smoking counseling  - none needed Evidence for depression or other mood disorder - none significant Most recent labs reviewed. I have personally reviewed and have noted: 1) the patient's medical and social history 2) The patient's current  medications and supplements 3) The patient's height, weight, and BMI have been recorded in the chart   B12 deficiency Lab Results  Component Value Date   VITAMINB12 389 08/06/2023   Stable, cont oral replacement - b12 1000 mcg qd   DM2 (diabetes mellitus, type 2) (HCC) Lab Results  Component Value Date   HGBA1C 6.2 08/06/2023   Stable, pt to continue current medical treatment mounjaro 5 mg weekly   Essential hypertension BP Readings from Last 3 Encounters:  08/06/23 128/82  07/18/23 (!) 124/111  07/06/23 114/71   Stable, pt to continue medical treatment coreg 6.25 bid, entresto 97 103 mg bid,    Hyperlipidemia Lab Results  Component Value Date   LDLCALC 51 08/06/2023   Stable, pt to continue current statin lipitor 80 every day, zetia 10 qd   Vitamin D deficiency Last vitamin D Lab Results  Component Value Date   VD25OH 30.44 08/06/2023   Low, to start oral replacement   Chronic combined systolic and diastolic heart failure (HCC) No evidence for volume overload, in fact likely mild low volume in light of recent events; pt advised to HOLD torsemide for 4 days, then restart after drinking more fluids, daily wts Followup: Return in about 6 months (around 02/03/2024).  Oliver Barre, MD 08/10/2023 5:51 PM [facility] Internal Medicine

## 2023-08-06 NOTE — Patient Instructions (Addendum)
 You had the flu shot today  Please remember the Tdap tetanus and Shingrx shots at the pharmacy; also the RSV shot  Please consider returning say in 1 wk or more for a Nurse Visit for the Prevnar 20 pneumonia shot  Please continue all other medications as before, and refills have been done, except for mexilitene, amiodarone, aldactone, and bidil and furosemide and plavix and coreg (all per cardiology)  Please have the pharmacy call with any other refills you may need.  Please continue your efforts at being more active, low cholesterol diet, and weight control.  You are otherwise up to date with prevention measures today.  Please keep your appointments with your specialists as you may have planned  You will be contacted regarding the referral for: colonoscopy  Please go to the LAB at the blood drawing area for the tests to be done  You will be contacted by phone if any changes need to be made immediately.  Otherwise, you will receive a letter about your results with an explanation, but please check with MyChart first.  Please make an Appointment to return in 6 months, or sooner if needed, also with Lab Appointment for testing done 3-5 days before at the FIRST FLOOR Lab (so this is for TWO appointments - please see the scheduling desk as you leave)

## 2023-08-06 NOTE — Progress Notes (Signed)
 The test results show that your current treatment is OK, as the tests are stable.  Please continue the same plan.  There is no other need for change of treatment or further evaluation based on these results, at this time.  thanks

## 2023-08-09 ENCOUNTER — Other Ambulatory Visit: Payer: Self-pay | Admitting: Internal Medicine

## 2023-08-09 DIAGNOSIS — I10 Essential (primary) hypertension: Secondary | ICD-10-CM

## 2023-08-10 ENCOUNTER — Encounter: Payer: Self-pay | Admitting: Internal Medicine

## 2023-08-10 NOTE — Assessment & Plan Note (Signed)
 Last vitamin D Lab Results  Component Value Date   VD25OH 30.44 08/06/2023   Low, to start oral replacement

## 2023-08-10 NOTE — Assessment & Plan Note (Signed)
 Age and sex appropriate education and counseling updated with regular exercise and diet Referrals for preventative services - for colonoscopy Immunizations addressed - for flu shot, for shingrix and tdap for pharmacy Smoking counseling  - none needed Evidence for depression or other mood disorder - none significant Most recent labs reviewed. I have personally reviewed and have noted: 1) the patient's medical and social history 2) The patient's current medications and supplements 3) The patient's height, weight, and BMI have been recorded in the chart

## 2023-08-10 NOTE — Assessment & Plan Note (Signed)
 No evidence for volume overload, in fact likely mild low volume in light of recent events; pt advised to HOLD torsemide for 4 days, then restart after drinking more fluids, daily wts

## 2023-08-10 NOTE — Assessment & Plan Note (Signed)
 Lab Results  Component Value Date   LDLCALC 51 08/06/2023   Stable, pt to continue current statin lipitor 80 every day, zetia 10 qd

## 2023-08-10 NOTE — Assessment & Plan Note (Signed)
 Lab Results  Component Value Date   VITAMINB12 389 08/06/2023   Stable, cont oral replacement - b12 1000 mcg qd

## 2023-08-10 NOTE — Assessment & Plan Note (Signed)
 BP Readings from Last 3 Encounters:  08/06/23 128/82  07/18/23 (!) 124/111  07/06/23 114/71   Stable, pt to continue medical treatment coreg 6.25 bid, entresto 97 103 mg bid,

## 2023-08-10 NOTE — Assessment & Plan Note (Signed)
 Lab Results  Component Value Date   HGBA1C 6.2 08/06/2023   Stable, pt to continue current medical treatment mounjaro 5 mg weekly

## 2023-08-15 ENCOUNTER — Ambulatory Visit: Payer: 59

## 2023-08-15 DIAGNOSIS — I255 Ischemic cardiomyopathy: Secondary | ICD-10-CM

## 2023-08-18 LAB — CUP PACEART REMOTE DEVICE CHECK
Battery Remaining Longevity: 84 mo
Battery Remaining Percentage: 86 %
Battery Voltage: 2.99 V
Brady Statistic AP VP Percent: 67 %
Brady Statistic AP VS Percent: 1 %
Brady Statistic AS VP Percent: 32 %
Brady Statistic AS VS Percent: 1 %
Brady Statistic RA Percent Paced: 66 %
Date Time Interrogation Session: 20250306020103
HighPow Impedance: 66 Ohm
Implantable Lead Connection Status: 753985
Implantable Lead Connection Status: 753985
Implantable Lead Connection Status: 753985
Implantable Lead Implant Date: 20240305
Implantable Lead Implant Date: 20240305
Implantable Lead Implant Date: 20240305
Implantable Lead Location: 753858
Implantable Lead Location: 753859
Implantable Lead Location: 753860
Implantable Pulse Generator Implant Date: 20240305
Lead Channel Impedance Value: 1025 Ohm
Lead Channel Impedance Value: 430 Ohm
Lead Channel Impedance Value: 450 Ohm
Lead Channel Pacing Threshold Amplitude: 0.5 V
Lead Channel Pacing Threshold Amplitude: 0.5 V
Lead Channel Pacing Threshold Amplitude: 0.875 V
Lead Channel Pacing Threshold Pulse Width: 0.5 ms
Lead Channel Pacing Threshold Pulse Width: 0.5 ms
Lead Channel Pacing Threshold Pulse Width: 0.5 ms
Lead Channel Sensing Intrinsic Amplitude: 2.8 mV
Lead Channel Sensing Intrinsic Amplitude: 8.8 mV
Lead Channel Setting Pacing Amplitude: 1.5 V
Lead Channel Setting Pacing Amplitude: 1.5 V
Lead Channel Setting Pacing Amplitude: 1.875
Lead Channel Setting Pacing Pulse Width: 0.5 ms
Lead Channel Setting Pacing Pulse Width: 0.5 ms
Lead Channel Setting Sensing Sensitivity: 0.5 mV
Pulse Gen Serial Number: 810089146

## 2023-08-22 ENCOUNTER — Encounter: Payer: Self-pay | Admitting: Cardiovascular Disease

## 2023-09-05 NOTE — Telephone Encounter (Signed)
 Spoke with patient. Past due for follow up appointment, scheduled with Canary Brim on 09/26/23 at 8:25 am and sent in 1 month supply of mexiletine. Updated patient phone number and reset MyChart password at patient request (has been unable to access in years). No further needs at this time

## 2023-09-09 ENCOUNTER — Other Ambulatory Visit: Payer: Self-pay | Admitting: Internal Medicine

## 2023-09-09 NOTE — Telephone Encounter (Signed)
 Medication, tirzepatide, sent on 08/06/23 with 3 refills available. Patient needs to call his pharmacy to request refill. Cancelled reorder. Closing encounter.

## 2023-09-09 NOTE — Telephone Encounter (Signed)
 Copied from CRM [id number]. Topic: Clinical - Medication Refill >> Sep 09, 2023  9:36 AM Orson Gear wrote: Most Recent Primary Care Visit:  Provider: Corwin Levins  Department: [facility]  Visit Type: OFFICE VISIT  Date: 08/06/2023  Medication: tirzepatide [facility]) 5 MG/0.5ML Pen  Has the patient contacted their pharmacy? No, patient initiated RX refill through provider to insure that he receives the correct dosage (Agent: If no, request that the patient contact the pharmacy for the refill. If patient does not wish to contact the pharmacy document the reason why and proceed with request.) (Agent: If yes, when and what did the pharmacy advise?)  Is this the correct pharmacy for this prescription? Yes If no, delete pharmacy and type the correct one.  This is the patient's preferred pharmacy:  [id number] [place], [place] - [address] AT [facility] [address] [place] [address] Phone: [phone] Fax: [phone]  Has the prescription been filled recently? Yes  Is the patient out of the medication? Yes  Has the patient been seen for an appointment in the last year OR does the patient have an upcoming appointment? Yes  Can we respond through MyChart? Yes  Agent: Please be advised that Rx refills may take up to 3 business days. We ask that you follow-up with your pharmacy.

## 2023-09-20 NOTE — Progress Notes (Signed)
 Remote ICD transmission.

## 2023-09-20 NOTE — Addendum Note (Signed)
 Addended by: Elease Etienne A on: 09/20/2023 11:08 AM   Modules accepted: Orders

## 2023-09-23 NOTE — Progress Notes (Unsigned)
 Electrophysiology Office Note:   Date:  09/26/2023  ID:  [name] [name], DOB 05/22/1956, MRN [id number]  Primary Cardiologist: Tonny Bollman, MD Primary Heart Failure: None Electrophysiologist: Maurice Small, MD       History of Present Illness:   [name] [name] is a 68 y.o. male with h/o HFrEF, ICM, VT s/p BiV ICD, CAD seen today for routine electrophysiology followup.   Since last being seen in our clinic the patient reports doing very well. He states "I do everything I can to prevent a shock".  He reports compliance with medications. Has lost a significant amount of weight on Mounjaro > from 315 to 258 lbs.  He notes occasional dizziness with position changes.  Also, at times in the early morning, he notices as strange cramping feeling that feels like it corresponds with his heart beat.     He denies chest pain, palpitations, dyspnea, PND, orthopnea, nausea, vomiting, dizziness, syncope, edema, weight gain, or early satiety.   Review of systems complete and found to be negative unless listed in HPI.   EP Information / Studies Reviewed:    EKG is not ordered today. EKG from 07/18/23 reviewed which showed ASVP  EKG Interpretation Date/Time:  Thursday September 26 2023 07:52:35 EDT Ventricular Rate:  65 PR Interval:  150 QRS Duration:  176 QT Interval:  464 QTC Calculation: 482 R Axis:   -83  Text Interpretation: Atrial-sensed ventricular-paced rhythm with occasional Premature ventricular complexes Biventricular pacemaker detected Confirmed by Canary Brim ([id number]) on 09/26/2023 8:34:07 AM   ICD Interrogation-  reviewed in detail today,  See PACEART report.  Device History: Abbott BiV ICD implanted 08/14/2022 for RBBB, VT, HFrEF History of appropriate therapy: No History of AAD therapy: Yes; currently on amiodarone    Studies:  ECHO 08/12/22 > LVEF 30-35%, WMA's present, GIIDD, mildly dilated LA  LHC 08/2022 > severe CAD, 99% RCA lesion, 90% distal LAD lesion too distal for  intervention   Arrhythmia / AAD VT            Physical Exam:   VS:  BP 114/76   Pulse 65   Ht 6\' 1"  (1.854 m)   Wt 253 lb 3.2 oz (114.9 kg)   SpO2 98%   BMI 33.41 kg/m    Wt Readings from Last 3 Encounters:  09/26/23 253 lb 3.2 oz (114.9 kg)  09/25/23 254 lb (115.2 kg)  08/06/23 254 lb (115.2 kg)     GEN: Well nourished, well developed in no acute distress NECK: No JVD; No carotid bruits CARDIAC: Regular rate and rhythm, no murmurs, rubs, gallops RESPIRATORY:  Clear to auscultation without rales, wheezing or rhonchi  ABDOMEN: Soft, non-tender, non-distended EXTREMITIES:  No edema; No deformity   ASSESSMENT AND PLAN:    Chronic Systolic Dysfunction / ICM s/p Abbott CRT-D  RBBB VT  High Risk Medication Monitoring: Amiodarone  -euvolemic today -Stable on an appropriate medical regimen / GDMT  -continue Mexiletine 250 mg BID   -amiodarone 200 mg daily   -amiodarone labs up to date -Normal ICD function -GDMT per Cardiology  -See Arita Miss Art report -pt noted to have phrenic nerve stimulation with high output on LV capture testing in clinic > auto capture turned off   CAD  Chronic disease, not amenable to intervention on [facility] 08/2022 -no anginal symptoms  Disposition:   Follow up with Dr. Nelly Laurence or EP APP in 6 months   Signed, Canary Brim, NP-C, AGACNP-BC [facility] - Electrophysiology  09/26/2023, 12:04 PM

## 2023-09-24 ENCOUNTER — Ambulatory Visit: Payer: Self-pay

## 2023-09-24 NOTE — Telephone Encounter (Signed)
 Copied from CRM [id number]. Topic: Clinical - Red Word Triage >> Sep 24, 2023  9:29 AM Almira Coaster wrote: Red Word that prompted transfer to Nurse Triage: Patient is calling because he has had depression before and was on medication; however, they took him off because the medication was to strong. Lately he's been feeling anxious and just feels like he need to speak with someone.  Chief Complaint: Worsening anxiety Symptoms: Restlessness Frequency: A month Pertinent Negatives: Patient denies SI Disposition: [] ED /[] Urgent Care (no appt availability in office) / [x] Appointment(In office/virtual)/ []  [facility]/ [] Home Care/ [] Refused Recommended Disposition /[] [facility]/ []  Follow-up with PCP Additional Notes: Patient called in to report worsening anxiety. Patient stated he has a history of generalized anxiety and depression. Patient stated he has noticed worsening symptoms over the past month. Patient denied thoughts of harming himself or others at this time. Patient stated he feels more restless and worried than usual. Patient denied physical symptoms at this time. Advised patient to follow-up with PCP in office. Scheduled patient with PCP tomorrow morning. Provided care advice and instructed patient to call back if symptoms worsen. Patient complied.   Reason for Disposition  [1] Symptoms of anxiety or panic attack AND [2] is a chronic symptom (recurrent or ongoing AND present > 4 weeks)  Answer Assessment - Initial Assessment Questions 1. CONCERN: "Did anything happen that prompted you to call today?"      Anxious about "going to the store", "getting in the car", etc.  2. ANXIETY SYMPTOMS: "Can you describe how you (your loved one; patient) have been feeling?" (e.g., tense, restless, panicky, anxious, keyed up, overwhelmed, sense of impending doom).      "A little restless"  3. ONSET: "How long have you been feeling this way?" (e.g., hours, days, weeks)     About a month 4.  SEVERITY: "How would you rate the level of anxiety?" (e.g., 0 - 10; or mild, moderate, severe).     Rates anxiety a 2 at this time 5. FUNCTIONAL IMPAIRMENT: "How have these feelings affected your ability to do daily activities?" "Have you had more difficulty than usual doing your normal daily activities?" (e.g., getting better, same, worse; self-care, school, work, interactions)     No changes 6. HISTORY: "Have you felt this way before?" "Have you ever been diagnosed with an anxiety problem in the past?" (e.g., generalized anxiety disorder, panic attacks, PTSD). If Yes, ask: "How was this problem treated?" (e.g., medicines, counseling, etc.)     Yes, has been diagnosed with generalized anxiety and depression 7. RISK OF HARM - SUICIDAL IDEATION: "Do you ever have thoughts of hurting or killing yourself?" If Yes, ask:  "Do you have these feelings now?" "Do you have a plan on how you would do this?"     Denies 8. TREATMENT:  "What has been done so far to treat this anxiety?" (e.g., medicines, relaxation strategies). "What has helped?"     States he used to be on medication 9. TREATMENT - THERAPIST: "Do you have a counselor or therapist? Name?"     States he is not at this time, but is open do it 10. POTENTIAL TRIGGERS: "Do you drink caffeinated beverages (e.g., coffee, colas, teas), and how much daily?" "Do you drink alcohol or use any drugs?" "Have you started any new medicines recently?"       N/A 11. PATIENT SUPPORT: "Who is with you now?" "Who do you live with?" "Do you have family or friends  who you can talk to?"        Not at home, states he has a friend who he talks to about this daily 12. OTHER SYMPTOMS: "Do you have any other symptoms?" (e.g., feeling depressed, trouble concentrating, trouble sleeping, trouble breathing, palpitations or fast heartbeat, chest pain, sweating, nausea, or diarrhea)       Denies system  Protocols used: Anxiety and Panic Attack-A-AH

## 2023-09-25 ENCOUNTER — Ambulatory Visit: Admitting: Internal Medicine

## 2023-09-25 ENCOUNTER — Encounter: Payer: Self-pay | Admitting: Internal Medicine

## 2023-09-25 VITALS — BP 120/78 | HR 62 | Temp 97.5°F | Ht 73.0 in | Wt 254.0 lb

## 2023-09-25 DIAGNOSIS — Z7985 Long-term (current) use of injectable non-insulin antidiabetic drugs: Secondary | ICD-10-CM

## 2023-09-25 DIAGNOSIS — E1159 Type 2 diabetes mellitus with other circulatory complications: Secondary | ICD-10-CM | POA: Diagnosis not present

## 2023-09-25 DIAGNOSIS — F419 Anxiety disorder, unspecified: Secondary | ICD-10-CM

## 2023-09-25 DIAGNOSIS — E538 Deficiency of other specified B group vitamins: Secondary | ICD-10-CM

## 2023-09-25 DIAGNOSIS — E782 Mixed hyperlipidemia: Secondary | ICD-10-CM | POA: Diagnosis not present

## 2023-09-25 DIAGNOSIS — I10 Essential (primary) hypertension: Secondary | ICD-10-CM | POA: Diagnosis not present

## 2023-09-25 DIAGNOSIS — E559 Vitamin D deficiency, unspecified: Secondary | ICD-10-CM

## 2023-09-25 DIAGNOSIS — F32A Depression, unspecified: Secondary | ICD-10-CM | POA: Diagnosis not present

## 2023-09-25 MED ORDER — SERTRALINE HCL 100 MG PO TABS
100.0000 mg | ORAL_TABLET | Freq: Every day | ORAL | 3 refills | Status: AC
Start: 2023-09-25 — End: ?

## 2023-09-25 MED ORDER — TIRZEPATIDE 5 MG/0.5ML ~~LOC~~ SOAJ: 5.0000 mg | SUBCUTANEOUS | 3 refills | Status: AC

## 2023-09-25 NOTE — Patient Instructions (Addendum)
 Your Username and PW for Mychart is;  [id number]  Please take all new medication as prescribed- the zoloft 100 mg - to start HALF per day for 2 weeks but increase to 100 mg if not working well  Please continue all other medications as before, and refills have been done if requested.  Please have the pharmacy call with any other refills you may need.  Please continue your efforts at being more active, low cholesterol diet, and weight control  Please keep your appointments with your specialists as you may have planned  You will be contacted regarding the referral for: Counseling  Please make an Appointment to return in Aug 26, or sooner if needed

## 2023-09-25 NOTE — Progress Notes (Signed)
 Patient ID: [name] [name], male   DOB: [birth year], 68 y.o.   MRN: [id number]        Chief Complaint: follow up depression, dm, hld, htn, low vit d and b12, anxiety       HPI:  [name] [name] is a 68 y.o. male here with c/o increase anxiety with situations like making important decisions and driving.  .Pt denies chest pain, increased sob or doe, wheezing, orthopnea, PND, increased LE swelling, palpitations, dizziness or syncope.   Pt denies polydipsia, polyuria, or new focal neuro s/s.   Denies worsening depressive symptoms, suicidal ideation, or panic;       Wt Readings from Last 3 Encounters:  09/26/23 253 lb 3.2 oz (114.9 kg)  09/25/23 254 lb (115.2 kg)  08/06/23 254 lb (115.2 kg)   BP Readings from Last 3 Encounters:  09/26/23 114/76  09/25/23 120/78  08/06/23 128/82         Past Medical History:  Diagnosis Date   CAD (coronary artery disease)    a. s/p BMS pRCA 2002; b. DES to pRCA & DES p/m RCA 2006; c. PCI/DES OM4 2007; d. PCI/CBA to RCA for ISR 10/2006; e.08/2012 STEMI/Cath/PCI:  LAD 95% >> PCI: Promus Prem DES  //  f. NSTEMI 10/15 >> LHC: mLAD stent ok, dLAD 70, OM1 CTO, OM2 stent ok, dOM2 90, OM3 30-40, dLCx 90, p-mRCA stent ok, mRCA stent ok w/ 60-70 ISR, EF 50% >> med Rx // MV 1/17: no ischemia // Cath ([facility]) 05/2019: RCA 100 (CTO)    Combined systolic and diastolic CHF    a. Myoview  1/17: EF 35%  //  b. Echo 1/17 EF 45-50% // Echo 05/2019: EF 50-55 // Echocardiogram 8/21: EF 50, inf AK, post and mid inf HK, mod LVH, RVSP 25.3, mild to mod LAE, trivial MR    Depression    Diabetes mellitus (HCC)    a. A1c 8.8 08/2012->Metformin  initiated. => b. A1c (9/14): 6.6   Gastroesophageal reflux disease    History of nuclear stress test    a. Myoview  1/17: EF 35%, fixed inferior lateral defect consistent with infarct, no ischemia, intermediate risk   History of pneumonia    History of stroke 01/2021   Zio Monitor 11/22: NSR, avg HR 61; PVCs (1.9%); no AFib, no high grade HB, no  ventricular arrhythmias   HTN (hypertension)    Hx of NSTEMI    Hyperlipidemia    Ischemic cardiomyopathy    a. EF 40%; improved to normal;  b. 08/2012 Echo: EF 50-55%, mod LVH.//  c. Echo 9/16: Inf HK, mild LVH, EF 55%, mild LAE, normal RVF, mild RAE, PASP 35 mmHg  //  d. Echo 1/17: EF 45-50%, inferior HK, mild BAE, PASP 33 mmHg   Obesity    OSA (obstructive sleep apnea)    Does not use CPAP as of 05/2011   Tobacco abuse    Past Surgical History:  Procedure Laterality Date   BIV ICD INSERTION CRT-D N/A 08/14/2022   Procedure: BIV ICD INSERTION CRT-D;  Surgeon: Efraim Grange, MD;  Location: [facility] INVASIVE CV LAB;  Service: Cardiovascular;  Laterality: N/A;   CORONARY ANGIOPLASTY WITH STENT PLACEMENT     CORONARY STENT PLACEMENT  2009   LEFT HEART CATH AND CORONARY ANGIOGRAPHY N/A 08/13/2022   Procedure: LEFT HEART CATH AND CORONARY ANGIOGRAPHY;  Surgeon: Sammy Crisp, MD;  Location: [facility] INVASIVE CV LAB;  Service: Cardiovascular;  Laterality: N/A;   LEFT HEART CATHETERIZATION WITH CORONARY ANGIOGRAM N/A 08/31/2012  Procedure: LEFT HEART CATHETERIZATION WITH CORONARY ANGIOGRAM;  Surgeon: Odie Benne, MD;  Location: [facility] CATH LAB;  Service: Cardiovascular;  Laterality: N/A;   LEFT HEART CATHETERIZATION WITH CORONARY ANGIOGRAM N/A 11/16/2013   Procedure: LEFT HEART CATHETERIZATION WITH CORONARY ANGIOGRAM;  Surgeon: Arlander Bellman, MD;  Location: [facility] CATH LAB;  Service: Cardiovascular;  Laterality: N/A;   LEFT HEART CATHETERIZATION WITH CORONARY ANGIOGRAM N/A 03/15/2014   Procedure: LEFT HEART CATHETERIZATION WITH CORONARY ANGIOGRAM;  Surgeon: Peter M Swaziland, MD;  Location: [facility] CATH LAB;  Service: Cardiovascular;  Laterality: N/A;   PERCUTANEOUS CORONARY STENT INTERVENTION (PCI-S) Right 08/31/2012   Procedure: PERCUTANEOUS CORONARY STENT INTERVENTION (PCI-S);  Surgeon: Odie Benne, MD;  Location: [facility] CATH LAB;  Service: Cardiovascular;  Laterality: Right;   PERCUTANEOUS CORONARY  STENT INTERVENTION (PCI-S)  11/16/2013   Procedure: PERCUTANEOUS CORONARY STENT INTERVENTION (PCI-S);  Surgeon: Arlander Bellman, MD;  Location: [facility] CATH LAB;  Service: Cardiovascular;;    reports that he has quit smoking. His smoking use included cigarettes. He has a 15 pack-year smoking history. He has never used smokeless tobacco. He reports that he does not drink alcohol and does not use drugs. family history includes Cancer in his mother; Coronary artery disease in an other family member; Depression in his mother; Hypertension in his mother; Other in his father. Allergies  Allergen Reactions   Penicillin G Other (See Comments)    Did it involve swelling of the face/tongue/throat, SOB, or low BP?  N/A Did it involve sudden or severe rash/hives, skin peeling, or any reaction on the inside of your mouth or nose? N/A Did you need to seek medical attention at a hospital or doctor's office? N/A When did it last happen? Child If all above answers are "NO", may proceed with cephalosporin use.   Sglt2 Inhibitors Other (See Comments)    Hospn April 2024 with UTI, DKA   Penicillins Other (See Comments)    Did it involve swelling of the face/tongue/throat, SOB, or low BP? N/A Did it involve sudden or severe rash/hives, skin peeling, or any reaction on the inside of your mouth or nose?N/A Did you need to seek medical attention at a hospital or doctor's office? N/A When did it last happen? Child      If all above answers are "NO", may proceed with cephalosporin use.   Current Outpatient Medications on File Prior to Visit  Medication Sig Dispense Refill   acetaminophen  (TYLENOL ) 500 MG tablet Take 1,000 mg by mouth every 6 (six) hours as needed for moderate pain.     albuterol  (VENTOLIN  HFA) 108 (90 Base) MCG/ACT inhaler Inhale 2 puffs into the lungs every 6 (six) hours as needed for wheezing or shortness of breath. 8 g 11   Alcohol Swabs  (B-D SINGLE USE SWABS  BUTTERFLY) PADS Use up to 4 times a day to  test blood sugars. Dx: E10.9, E11.9 100 each 11   amiodarone  (PACERONE ) 200 MG tablet Take 1 tablet (200 mg total) by mouth daily. 90 tablet 3   aspirin  EC 81 MG tablet Take 1 tablet (81 mg total) by mouth daily. Swallow whole. 90 tablet 3   atorvastatin  (LIPITOR ) 80 MG tablet Take 1 tablet (80 mg total) by mouth every evening. 90 tablet 3   Blood Glucose Calibration (TRUE METRIX LEVEL 1) Low SOLN Use as needed. Dx: E10.9, E11.9 1 each 0   Blood Glucose Monitoring Suppl (ONETOUCH VERIO FLEX SYSTEM) DEVI 1 Device by Does not apply route in the morning, at  noon, in the evening, and at bedtime. E11.9 1 each 0   carvedilol  (COREG ) 6.25 MG tablet Take 1 tablet (6.25 mg total) by mouth 2 (two) times daily with a meal. 180 tablet 3   clopidogrel  (PLAVIX ) 75 MG tablet Take 1 tablet (75 mg total) by mouth daily. 90 tablet 3   dorzolamide-timolol (COSOPT) 2-0.5 % ophthalmic solution Place 1 drop into both eyes See admin instructions. Instill one drop into both eyes twice in the morning and once at night     ENTRESTO  97-103 MG TAKE 1 TABLET BY MOUTH TWICE DAILY. NEED FOLLOW UP APPOINTMENT FOR ANYMORE REFILLS 60 tablet 2   ezetimibe  (ZETIA ) 10 MG tablet Take 1 tablet (10 mg total) by mouth daily. 90 tablet 3   fluticasone  (FLOVENT  HFA) 110 MCG/ACT inhaler Inhale 2 puffs into the lungs 2 (two) times daily. 1 each 12   furosemide  (LASIX ) 40 MG tablet Take 1 tablet (40 mg total) by mouth as needed for fluid or edema. As needed for 3lb weight gain 30 tablet 10   glucose blood (ONETOUCH VERIO) test strip Use as instructed four times per day E11.9 400 each 12   isosorbide -hydrALAZINE  (BIDIL ) 20-37.5 MG tablet TAKE 1 TABLET BY MOUTH THREE TIMES DAILY 270 tablet 2   Lancets MISC Use as directed four times per day E11.9 400 each 3   latanoprost (XALATAN) 0.005 % ophthalmic solution 1 drop at bedtime.     nitroGLYCERIN  (NITROSTAT ) 0.4 MG SL tablet Place 1 tablet (0.4 mg total) under the tongue every 5 (five) minutes as  needed for chest pain. N 25 tablet 3   spironolactone  (ALDACTONE ) 25 MG tablet Take 1 tablet (25 mg total) by mouth daily. 90 tablet 3   No current facility-administered medications on file prior to visit.        ROS:  All others reviewed and negative.  Objective        PE:  BP 120/78 (BP Location: Left Arm, Patient Position: Sitting, Cuff Size: Normal)   Pulse 62   Temp (!) 97.5 F (36.4 C) (Oral)   Ht 6\' 1"  (1.854 m)   Wt 254 lb (115.2 kg)   SpO2 98%   BMI 33.51 kg/m                 Constitutional: Pt appears in NAD               HENT: Head: NCAT.                Right Ear: External ear normal.                 Left Ear: External ear normal.                Eyes: . Pupils are equal, round, and reactive to light. Conjunctivae and EOM are normal               Nose: without d/c or deformity               Neck: Neck supple. Gross normal ROM               Cardiovascular: Normal rate and regular rhythm.                 Pulmonary/Chest: Effort normal and breath sounds without rales or wheezing.                Abd:  Soft, NT, ND, + BS, no organomegaly  Neurological: Pt is alert. At baseline orientation, motor grossly intact               Skin: Skin is warm. No rashes, no other new lesions, LE edema - trace to 1+ bilateral               Psychiatric: Pt behavior is normal without agitation   Micro: none  Cardiac tracings I have personally interpreted today:  none  Pertinent Radiological findings (summarize): none   Lab Results  Component Value Date   WBC 4.8 08/06/2023   HGB 13.8 08/06/2023   HCT 42.0 08/06/2023   PLT 240.0 08/06/2023   GLUCOSE 90 08/06/2023   CHOL 108 08/06/2023   TRIG 33.0 08/06/2023   HDL 50.20 08/06/2023   LDLCALC 51 08/06/2023   ALT 17 08/06/2023   AST 14 08/06/2023   NA 142 08/06/2023   K 4.8 08/06/2023   CL 109 08/06/2023   CREATININE 1.68 (H) 08/06/2023   BUN 20 08/06/2023   CO2 27 08/06/2023   TSH 1.02 08/06/2023   PSA 1.18  08/06/2023   INR 1.0 09/15/2022   HGBA1C 6.2 08/06/2023   MICROALBUR 2.9 (H) 07/27/2022   Assessment/Plan:  [name] [name] is a 68 y.o. Black or African American [2] male with  has a past medical history of CAD (coronary artery disease), Combined systolic and diastolic CHF, Depression, Diabetes mellitus (HCC), Gastroesophageal reflux disease, History of nuclear stress test, History of pneumonia, History of stroke (01/2021), HTN (hypertension), Hx of NSTEMI, Hyperlipidemia, Ischemic cardiomyopathy, Obesity, OSA (obstructive sleep apnea), and Tobacco abuse.  Depression Mild worsening, for also referral counseling  Anxiety With mild to mod worsening, for start zoloft  100 mg every day   B12 deficiency Lab Results  Component Value Date   VITAMINB12 389 08/06/2023   Stable, cont oral replacement - b12 1000 mcg qd   Vitamin D  deficiency Last vitamin D  Lab Results  Component Value Date   VD25OH 30.44 08/06/2023   Low, to start oral replacement   DM2 (diabetes mellitus, type 2) (HCC) Lab Results  Component Value Date   HGBA1C 6.2 08/06/2023   Stable, pt to continue current medical treatment mounjaro  5 mg weekly   Hyperlipidemia Lab Results  Component Value Date   LDLCALC 51 08/06/2023   Stable, pt to continue current statin lipitor  80 every day, zetia  10 qd   Essential hypertension BP Readings from Last 3 Encounters:  09/26/23 114/76  09/25/23 120/78  08/06/23 128/82   Stable, pt to continue medical treatment coreg  6.25 bid  Followup: Return in about 4 months (around 02/04/2024).  Rosalia Colonel, MD 09/27/2023 3:04 PM [facility] - [facility] Internal Medicine

## 2023-09-26 ENCOUNTER — Encounter: Payer: Self-pay | Admitting: Pulmonary Disease

## 2023-09-26 ENCOUNTER — Ambulatory Visit: Attending: Pulmonary Disease | Admitting: Pulmonary Disease

## 2023-09-26 ENCOUNTER — Ambulatory Visit (INDEPENDENT_AMBULATORY_CARE_PROVIDER_SITE_OTHER)

## 2023-09-26 VITALS — BP 114/76 | HR 65 | Ht 73.0 in | Wt 253.2 lb

## 2023-09-26 DIAGNOSIS — I5022 Chronic systolic (congestive) heart failure: Secondary | ICD-10-CM

## 2023-09-26 DIAGNOSIS — I472 Ventricular tachycardia, unspecified: Secondary | ICD-10-CM | POA: Diagnosis not present

## 2023-09-26 DIAGNOSIS — I255 Ischemic cardiomyopathy: Secondary | ICD-10-CM | POA: Diagnosis not present

## 2023-09-26 DIAGNOSIS — Z5181 Encounter for therapeutic drug level monitoring: Secondary | ICD-10-CM | POA: Diagnosis not present

## 2023-09-26 DIAGNOSIS — R001 Bradycardia, unspecified: Secondary | ICD-10-CM

## 2023-09-26 LAB — CUP PACEART INCLINIC DEVICE CHECK
Date Time Interrogation Session: 20250417120550
Implantable Lead Connection Status: 753985
Implantable Lead Connection Status: 753985
Implantable Lead Connection Status: 753985
Implantable Lead Implant Date: 20240305
Implantable Lead Implant Date: 20240305
Implantable Lead Implant Date: 20240305
Implantable Lead Location: 753858
Implantable Lead Location: 753859
Implantable Lead Location: 753860
Implantable Pulse Generator Implant Date: 20240305
Pulse Gen Serial Number: 810089146

## 2023-09-26 MED ORDER — MEXILETINE HCL 250 MG PO CAPS: ORAL_CAPSULE | ORAL | 3 refills | Status: AC

## 2023-09-26 NOTE — Progress Notes (Signed)
 Patient seen in device clinic by industry and myself to assess for STIM. Please see report for testing details.   Changes made to session: - LV Pulse Amplitude programmed from 2.5V to 1.75V. - LV Pulse Configuration programmed from Distal tip 1 - Mid 2 to Distal tip 1- Prox 4.

## 2023-09-26 NOTE — Patient Instructions (Addendum)
 Medication Instructions:   Your physician recommends that you continue on your current medications as directed. Please refer to the Current Medication list given to you today.  *If you need a refill on your cardiac medications before your next appointment, please call your pharmacy*  Lab Work: NONE ORDERED  TODAY    If you have labs (blood work) drawn today and your tests are completely normal, you will receive your results only by: MyChart Message (if you have MyChart) OR A paper copy in the mail If you have any lab test that is abnormal or we need to change your treatment, we will call you to review the results.  Testing/Procedures:  NONE ORDERED  TODAY    Follow-Up: At [facility], you and your health needs are our priority.  As part of our continuing mission to provide you with exceptional heart care, our providers are all part of one team.  This team includes your primary Cardiologist (physician) and Advanced Practice Providers or APPs (Physician Assistants and Nurse Practitioners) who all work together to provide you with the care you need, when you need it.  Your next appointment:  CALL AND MAKE APPOINTMENT WITH HEART FAILURE CLINIC   6 month(s)  Provider:   You may see Efraim Grange, MD or one of the following Advanced Practice Providers on your designated Care Team:   Creighton Doffing, NP   We recommend signing up for the patient portal called "MyChart".  Sign up information is provided on this After Visit Summary.  MyChart is used to connect with patients for Virtual Visits (Telemedicine).  Patients are able to view lab/test results, encounter notes, upcoming appointments, etc.  Non-urgent messages can be sent to your provider as well.   To learn more about what you can do with MyChart, go to [email or web address].   Other Instructions       1st Floor: - Lobby - Registration  - Pharmacy  - Lab - Cafe  2nd Floor: - PV Lab - Diagnostic Testing (echo,  CT, nuclear med)  3rd Floor: - Vacant  4th Floor: - TCTS (cardiothoracic surgery) - AFib Clinic - Structural Heart Clinic - Vascular Surgery  - Vascular Ultrasound  5th Floor: - HeartCare Cardiology (general and EP) - Clinical Pharmacy for coumadin, hypertension, lipid, weight-loss medications, and med management appointments    Valet parking services will be available as well.

## 2023-09-27 ENCOUNTER — Encounter: Payer: Self-pay | Admitting: Internal Medicine

## 2023-09-27 DIAGNOSIS — F419 Anxiety disorder, unspecified: Secondary | ICD-10-CM | POA: Insufficient documentation

## 2023-09-27 NOTE — Assessment & Plan Note (Signed)
 BP Readings from Last 3 Encounters:  09/26/23 114/76  09/25/23 120/78  08/06/23 128/82   Stable, pt to continue medical treatment coreg  6.25 bid

## 2023-09-27 NOTE — Assessment & Plan Note (Signed)
 Lab Results  Component Value Date   VITAMINB12 389 08/06/2023   Stable, cont oral replacement - b12 1000 mcg qd

## 2023-09-27 NOTE — Assessment & Plan Note (Signed)
 Lab Results  Component Value Date   LDLCALC 51 08/06/2023   Stable, pt to continue current statin lipitor 80 every day, zetia 10 qd

## 2023-09-27 NOTE — Assessment & Plan Note (Signed)
 Mild worsening, for also referral counseling

## 2023-09-27 NOTE — Assessment & Plan Note (Signed)
 Lab Results  Component Value Date   HGBA1C 6.2 08/06/2023   Stable, pt to continue current medical treatment mounjaro 5 mg weekly

## 2023-09-27 NOTE — Assessment & Plan Note (Signed)
 Last vitamin D Lab Results  Component Value Date   VD25OH 30.44 08/06/2023   Low, to start oral replacement

## 2023-09-27 NOTE — Assessment & Plan Note (Signed)
 With mild to mod worsening, for start zoloft  100 mg every day

## 2023-09-28 ENCOUNTER — Emergency Department (HOSPITAL_COMMUNITY)
Admission: EM | Admit: 2023-09-28 | Discharge: 2023-09-28 | Disposition: A | Discharge status: Home / Self Care | Attending: Emergency Medicine | Admitting: Emergency Medicine

## 2023-09-28 ENCOUNTER — Other Ambulatory Visit: Payer: Self-pay

## 2023-09-28 ENCOUNTER — Emergency Department (HOSPITAL_COMMUNITY)

## 2023-09-28 DIAGNOSIS — R0602 Shortness of breath: Secondary | ICD-10-CM | POA: Insufficient documentation

## 2023-09-28 DIAGNOSIS — E119 Type 2 diabetes mellitus without complications: Secondary | ICD-10-CM | POA: Diagnosis not present

## 2023-09-28 DIAGNOSIS — I2510 Atherosclerotic heart disease of native coronary artery without angina pectoris: Secondary | ICD-10-CM | POA: Insufficient documentation

## 2023-09-28 DIAGNOSIS — I5040 Unspecified combined systolic (congestive) and diastolic (congestive) heart failure: Secondary | ICD-10-CM | POA: Diagnosis not present

## 2023-09-28 DIAGNOSIS — R002 Palpitations: Secondary | ICD-10-CM | POA: Insufficient documentation

## 2023-09-28 DIAGNOSIS — I700 Atherosclerosis of aorta: Secondary | ICD-10-CM | POA: Diagnosis not present

## 2023-09-28 DIAGNOSIS — Z7982 Long term (current) use of aspirin: Secondary | ICD-10-CM | POA: Insufficient documentation

## 2023-09-28 DIAGNOSIS — I110 Hypertensive heart disease with heart failure: Secondary | ICD-10-CM | POA: Diagnosis not present

## 2023-09-28 DIAGNOSIS — Z7902 Long term (current) use of antithrombotics/antiplatelets: Secondary | ICD-10-CM | POA: Insufficient documentation

## 2023-09-28 DIAGNOSIS — R5383 Other fatigue: Secondary | ICD-10-CM | POA: Diagnosis not present

## 2023-09-28 LAB — TROPONIN I (HIGH SENSITIVITY)
Troponin I (High Sensitivity): 28 ng/L — ABNORMAL HIGH (ref <18)
Troponin I (High Sensitivity): 36 ng/L — ABNORMAL HIGH (ref <18)

## 2023-09-28 LAB — CBC
HCT: 38.5 % — ABNORMAL LOW (ref 39.0–52.0)
Hemoglobin: 12.4 g/dL — ABNORMAL LOW (ref 13.0–17.0)
MCH: 29.3 pg (ref 26.0–34.0)
MCHC: 32.2 g/dL (ref 30.0–36.0)
MCV: 91 fL (ref 80.0–100.0)
Platelets: 157 10*3/uL (ref 150–400)
RBC: 4.23 MIL/uL (ref 4.22–5.81)
RDW: 17.2 % — ABNORMAL HIGH (ref 11.5–15.5)
WBC: 5 10*3/uL (ref 4.0–10.5)
nRBC: 0 % (ref 0.0–0.2)

## 2023-09-28 LAB — BASIC METABOLIC PANEL WITH GFR
Anion gap: 8 (ref 5–15)
BUN: 19 mg/dL (ref 8–23)
CO2: 19 mmol/L — ABNORMAL LOW (ref 22–32)
Calcium: 8.7 mg/dL — ABNORMAL LOW (ref 8.9–10.3)
Chloride: 114 mmol/L — ABNORMAL HIGH (ref 98–111)
Creatinine, Ser: 1.45 mg/dL — ABNORMAL HIGH (ref 0.61–1.24)
GFR, Estimated: 53 mL/min — ABNORMAL LOW (ref >60)
Glucose, Bld: 83 mg/dL (ref 70–99)
Potassium: 5 mmol/L (ref 3.5–5.1)
Sodium: 141 mmol/L (ref 135–145)

## 2023-09-28 NOTE — ED Provider Notes (Signed)
 Accepted handoff at shift change from [place] PA-C. Please see prior provider note for full HPI.  Briefly: Patient is a 68 y.o. male who presents to the ER for "fluttering in the chest". Had ICD placed last month, follows with cardiology.   DDX/Plan: Pending labs and imaging. ICD interrogated. Anticipate if workup normal, dc to home with cardiology follow up.   Physical Exam  BP 134/85 (BP Location: Right Arm)   Pulse 64   Temp 98.1 F (36.7 C) (Oral)   Resp 15   SpO2 100%   Physical Exam Vitals and nursing note reviewed.  Constitutional:      Appearance: Normal appearance.  HENT:     Head: Normocephalic and atraumatic.  Eyes:     Conjunctiva/sclera: Conjunctivae normal.  Pulmonary:     Effort: Pulmonary effort is normal. No respiratory distress.  Skin:    General: Skin is warm and dry.  Neurological:     Mental Status: He is alert.  Psychiatric:        Mood and Affect: Mood normal.        Behavior: Behavior normal.    Results   Results for orders placed or performed during the hospital encounter of 09/28/23  Basic metabolic panel   Collection Time: 09/28/23  3:06 PM  Result Value Ref Range   Sodium 141 135 - 145 mmol/L   Potassium 5.0 3.5 - 5.1 mmol/L   Chloride 114 (H) 98 - 111 mmol/L   CO2 19 (L) 22 - 32 mmol/L   Glucose, Bld 83 70 - 99 mg/dL   BUN 19 8 - 23 mg/dL   Creatinine, Ser 8.29 (H) 0.61 - 1.24 mg/dL   Calcium  8.7 (L) 8.9 - 10.3 mg/dL   GFR, Estimated 53 (L) >60 mL/min   Anion gap 8 5 - 15  CBC   Collection Time: 09/28/23  3:06 PM  Result Value Ref Range   WBC 5.0 4.0 - 10.5 K/uL   RBC 4.23 4.22 - 5.81 MIL/uL   Hemoglobin 12.4 (L) 13.0 - 17.0 g/dL   HCT 56.2 (L) 13.0 - 86.5 %   MCV 91.0 80.0 - 100.0 fL   MCH 29.3 26.0 - 34.0 pg   MCHC 32.2 30.0 - 36.0 g/dL   RDW 78.4 (H) 69.6 - 29.5 %   Platelets 157 150 - 400 K/uL   nRBC 0.0 0.0 - 0.2 %  Troponin I (High Sensitivity)   Collection Time: 09/28/23  3:06 PM  Result Value Ref Range   Troponin I  (High Sensitivity) 28 (H) <18 ng/L  Troponin I (High Sensitivity)   Collection Time: 09/28/23  5:40 PM  Result Value Ref Range   Troponin I (High Sensitivity) 36 (H) <18 ng/L   *Note: Due to a large number of results and/or encounters for the requested time period, some results have not been displayed. A complete set of results can be found in Results Review.   DG Chest Port 1 View Result Date: 09/28/2023 CLINICAL DATA:  Shortness of breath and palpitations EXAM: PORTABLE CHEST 1 VIEW COMPARISON:  07/18/2023 FINDINGS: Stable cardiomediastinal silhouette. Left chest wall CRT-D. Aortic atherosclerotic calcification. No focal consolidation, pleural effusion, or pneumothorax. No displaced rib fractures. IMPRESSION: No active disease. Electronically Signed   By: Rozell Cornet M.D.   On: 09/28/2023 17:13   CUP PACEART INCLINIC DEVICE CHECK Result Date: 09/26/2023 Normal in-clinic CRT-D (multi-lead) check. Presenting Rhythm: ASBiV . Routine testing was performed. Thresholds, sensing, impedance trend were stable and no changes were  required. HF diagnostics are stable. No treated arrhythmias. Patient BiV pacing 99% of the time. Estimated longevity 6.8 years . Pt noted to have phrenic nerve stimulation with LV auto-capture testing (as was unsuccessful and changed to 5V), LV auto capture turned off.  Pt enrolled in remote follow-up. bo bo   ED Course / MDM    Medical Decision Making Amount and/or Complexity of Data Reviewed Labs: ordered. Radiology: ordered.  Troponin stable x 2.  Patient has had no recurrence of symptoms during his almost 6 hour period of observation in the ER.  He is overall feeling well.  Workup reassuring.  Plan for discharge to home with close outpatient cardiology follow-up.  Given very strict return precautions.  Patient is agreeable to the plan.   [name] [name] 09/28/23 1925    [name] Montenegro, MD 09/29/23 [phone]

## 2023-09-28 NOTE — ED Triage Notes (Signed)
 Pt BIB [facility] from home d/t palpitations. HR 72. It started 3 days ago. Pt has a pacemaker. Pt c/o sob . O2 100% on RA. A&O X4.   132/86 Cbg 87

## 2023-09-28 NOTE — ED Provider Notes (Signed)
 [place] EMERGENCY DEPARTMENT AT [facility] Provider Note   CSN: [id number] Arrival date & time: 09/28/23  1329     History  Chief Complaint  Patient presents with   Palpitations    [name] [name] is a 68 y.o. male with medical history of CAD, combined systolic and diastolic CHF, diabetes, GERD, hypertension, history of NSTEMI, ischemic cardiomyopathy.  Patient presents to ED for evaluation of fluttering in chest.  Reports on Thursday of this past week he developed a "fluttering", "thumping" sensation to the right side of his chest.  He reports that he was seen by his cardiology team on Friday who "adjusted" his device however unable to tell me what exactly they did.  On chart review, the patient had an [employer] BiV ICD implanted on 08/14/2022 for RBBB, VT, heart failure reduced ejection fraction. Reports that at some point between Friday and today, he developed this "thumping" to the left portion of his chest just inferior to his left breast.  He reports at 1 point he was able to physically see his shirt move because of this "thumping".  He denies that this sensation radiates.  He states that he is "a little" short of breath.  He reports he is "always" lightheaded and dizzy and denies any increased lightheadedness or dizziness.  Denies any nausea or vomiting.  Denies any leg swelling.  Denies any syncope, generalized weakness.  Reports that "this is all very new to me" and he is concerned because of this "fluttering" that he feels.  He denies any chest pain.  He denies feelings as if his ICD is firing.   Palpitations Associated symptoms: shortness of breath   Associated symptoms: no chest pain        Home Medications Prior to Admission medications   Medication Sig Start Date End Date Taking? Authorizing Provider  acetaminophen  (TYLENOL ) 500 MG tablet Take 1,000 mg by mouth every 6 (six) hours as needed for moderate pain.    [provider]  albuterol  (VENTOLIN  HFA) 108  (90 Base) MCG/ACT inhaler Inhale 2 puffs into the lungs every 6 (six) hours as needed for wheezing or shortness of breath. 08/06/23   Roslyn Coombe, MD  Alcohol Swabs  (B-D SINGLE USE SWABS  BUTTERFLY) PADS Use up to 4 times a day to test blood sugars. Dx: E10.9, E11.9 09/30/20   Roslyn Coombe, MD  amiodarone  (PACERONE ) 200 MG tablet Take 1 tablet (200 mg total) by mouth daily. 08/27/22   Swinyer, Leilani Punter, NP  aspirin  EC 81 MG tablet Take 1 tablet (81 mg total) by mouth daily. Swallow whole. 08/27/22   Swinyer, Leilani Punter, NP  atorvastatin  (LIPITOR ) 80 MG tablet Take 1 tablet (80 mg total) by mouth every evening. 08/06/23   Roslyn Coombe, MD  Blood Glucose Calibration (TRUE METRIX LEVEL 1) Low SOLN Use as needed. Dx: E10.9, E11.9 07/27/22   Roslyn Coombe, MD  Blood Glucose Monitoring Suppl (ONETOUCH VERIO FLEX SYSTEM) DEVI 1 Device by Does not apply route in the morning, at noon, in the evening, and at bedtime. E11.9 08/10/22   Roslyn Coombe, MD  carvedilol  (COREG ) 6.25 MG tablet Take 1 tablet (6.25 mg total) by mouth 2 (two) times daily with a meal. 08/27/22   Swinyer, Leilani Punter, NP  clopidogrel  (PLAVIX ) 75 MG tablet Take 1 tablet (75 mg total) by mouth daily. 08/27/22   Swinyer, Leilani Punter, NP  dorzolamide-timolol (COSOPT) 2-0.5 % ophthalmic solution Place 1 drop into both eyes See admin  instructions. Instill one drop into both eyes twice in the morning and once at night 03/12/22   [provider]  ENTRESTO  97-103 MG TAKE 1 TABLET BY MOUTH TWICE DAILY. NEED FOLLOW UP APPOINTMENT FOR ANYMORE REFILLS 07/01/23   Sabharwal, Aditya, DO  ezetimibe  (ZETIA ) 10 MG tablet Take 1 tablet (10 mg total) by mouth daily. 08/06/23   Roslyn Coombe, MD  fluticasone  (FLOVENT  HFA) 110 MCG/ACT inhaler Inhale 2 puffs into the lungs 2 (two) times daily. 11/21/22   Roslyn Coombe, MD  furosemide  (LASIX ) 40 MG tablet Take 1 tablet (40 mg total) by mouth as needed for fluid or edema. As needed for 3lb weight gain 11/08/22   Arnoldo Lapping, MD  glucose blood [facility] VERIO) test strip Use as instructed four times per day E11.9 08/10/22   Roslyn Coombe, MD  isosorbide -hydrALAZINE  (BIDIL ) 20-37.5 MG tablet TAKE 1 TABLET BY MOUTH THREE TIMES DAILY 01/15/23   Swinyer, Leilani Punter, NP  Lancets MISC Use as directed four times per day E11.9 08/10/22   Roslyn Coombe, MD  latanoprost (XALATAN) 0.005 % ophthalmic solution 1 drop at bedtime. 12/24/22   [provider]  mexiletine (MEXITIL ) 250 MG capsule TAKE 1 CAPSULE(250 MG) BY MOUTH EVERY 12 HOURS 09/26/23   Ollis, Brandi L, NP  nitroGLYCERIN  (NITROSTAT ) 0.4 MG SL tablet Place 1 tablet (0.4 mg total) under the tongue every 5 (five) minutes as needed for chest pain. N 08/27/22   Swinyer, Leilani Punter, NP  sertraline  (ZOLOFT ) 100 MG tablet Take 1 tablet (100 mg total) by mouth daily. 09/25/23   Roslyn Coombe, MD  spironolactone  (ALDACTONE ) 25 MG tablet Take 1 tablet (25 mg total) by mouth daily. 08/27/22   Swinyer, Leilani Punter, NP  tirzepatide  (MOUNJARO ) 5 MG/0.5ML Pen Inject 5 mg into the skin once a week. 09/25/23   Roslyn Coombe, MD      Allergies    Penicillin g, Sglt2 inhibitors, and Penicillins    Review of Systems   Review of Systems  Constitutional:  Negative for fever.  Respiratory:  Positive for shortness of breath.   Cardiovascular:  Positive for palpitations. Negative for chest pain and leg swelling.  All other systems reviewed and are negative.   Physical Exam Updated Vital Signs BP 105/66 (BP Location: Right Arm)   Pulse 66   Temp 98.3 F (36.8 C) (Oral)   Resp 19   SpO2 100%  Physical Exam Vitals and nursing note reviewed.  Constitutional:      General: He is not in acute distress.    Appearance: He is well-developed.  HENT:     Head: Normocephalic and atraumatic.  Eyes:     Conjunctiva/sclera: Conjunctivae normal.  Cardiovascular:     Rate and Rhythm: Normal rate and regular rhythm.     Heart sounds: No murmur heard. Pulmonary:     Effort: Pulmonary  effort is normal. No respiratory distress.     Breath sounds: Normal breath sounds.  Abdominal:     Palpations: Abdomen is soft.     Tenderness: There is no abdominal tenderness.  Musculoskeletal:        General: No swelling.     Cervical back: Neck supple.     Right lower leg: No edema.     Left lower leg: No edema.  Skin:    General: Skin is warm and dry.     Capillary Refill: Capillary refill takes less than 2 seconds.  Neurological:  Mental Status: He is alert and oriented to person, place, and time.  Psychiatric:        Mood and Affect: Mood normal.     ED Results / Procedures / Treatments   Labs (all labs ordered are listed, but only abnormal results are displayed) Labs Reviewed  BASIC METABOLIC PANEL WITH GFR  CBC  TROPONIN I (HIGH SENSITIVITY)    EKG None  Radiology No results found.  Procedures Procedures   Medications Ordered in ED Medications - No data to display  ED Course/ Medical Decision Making/ A&P  Medical Decision Making  68 year old male presents for evaluation.  Please see HPI for further details.  On examination patient is afebrile and nontachycardic.  His lung sounds are clear bilaterally, he is not hypoxic.  Abdomen soft and compressible.  Neurological examinations are baseline.  2+ DP pulse in the bilateral lower extremities.  No edema to bilateral lower extremities.  Chart reviewed.  Patient had unremarkable cardiology office visit on 4/18, yesterday.  At this visit they did assess his ICD and noted no issues with it.  They did turn off auto capture.  Will assess with CBC, BMP, troponin, EKG, chest x-ray.  Will interrogate ICD.  At end of shift, patient workup not complete. Signed out to oncoming provider Ro[place]ildt PA-C. Plan of management discussed.    Final Clinical Impression(s) / ED Diagnoses Final diagnoses:  Palpitations    Rx / DC Orders ED Discharge Orders     None         Kristin Peyer 09/28/23  1503    LongShereen Dike, MD 10/08/23 1733

## 2023-09-28 NOTE — Discharge Instructions (Signed)
 You were seen in the emergency department today for palpitations.  As we discussed your workup was overall very reassuring.  I recommend following up with your cardiology team next week.  Continue to monitor how you are doing and please return to the ER for any new or worsening symptoms.

## 2023-09-30 ENCOUNTER — Ambulatory Visit: Attending: Internal Medicine

## 2023-09-30 ENCOUNTER — Telehealth: Payer: Self-pay

## 2023-09-30 DIAGNOSIS — I5042 Chronic combined systolic (congestive) and diastolic (congestive) heart failure: Secondary | ICD-10-CM

## 2023-09-30 LAB — CUP PACEART INCLINIC DEVICE CHECK
Date Time Interrogation Session: 20250417143618
Date Time Interrogation Session: 20250421151520
Implantable Lead Connection Status: 753985
Implantable Lead Connection Status: 753985
Implantable Lead Connection Status: 753985
Implantable Lead Connection Status: 753985
Implantable Lead Connection Status: 753985
Implantable Lead Connection Status: 753985
Implantable Lead Implant Date: 20240305
Implantable Lead Implant Date: 20240305
Implantable Lead Implant Date: 20240305
Implantable Lead Implant Date: 20240305
Implantable Lead Implant Date: 20240305
Implantable Lead Implant Date: 20240305
Implantable Lead Location: 753858
Implantable Lead Location: 753859
Implantable Lead Location: 753860
Implantable Lead Location: 753858
Implantable Lead Location: 753859
Implantable Lead Location: 753860
Implantable Pulse Generator Implant Date: 20240305
Implantable Pulse Generator Implant Date: 20240305
Pulse Gen Serial Number: 810089146
Pulse Gen Serial Number: 810089146

## 2023-09-30 NOTE — Progress Notes (Signed)
 Patient seen in follow up for re-current diaphragmatic stimulation- see report for changes made.

## 2023-09-30 NOTE — Patient Instructions (Signed)
Keep scheduled follow up

## 2023-09-30 NOTE — Telephone Encounter (Signed)
 Outreach made to Pt.  Advised could see him at 3:00 pm today with industry.  Pt agreeable.    Industry aware and appointment made.

## 2023-09-30 NOTE — Telephone Encounter (Signed)
 The pt still feeling some stim. I told him the nurse will reach out to [employer] rep and then give him a call back to give him a time to come in.

## 2023-10-03 ENCOUNTER — Encounter

## 2023-10-04 ENCOUNTER — Telehealth: Payer: Self-pay | Admitting: *Deleted

## 2023-10-04 ENCOUNTER — Telehealth: Payer: Self-pay

## 2023-10-04 DIAGNOSIS — I5042 Chronic combined systolic (congestive) and diastolic (congestive) heart failure: Secondary | ICD-10-CM

## 2023-10-04 NOTE — Progress Notes (Signed)
 Complex Care Management Note Care Guide Note  10/04/2023 Name: [name] [name] MRN: [id number] DOB: 03/25/1956   Complex Care Management Outreach Attempts: An unsuccessful telephone outreach was attempted today to offer the patient information about available complex care management services.  Follow Up Plan:  Additional outreach attempts will be made to offer the patient complex care management information and services.   Encounter Outcome:  No Answer  Kandis Ormond, CMA [place]  [facility], [facility] Guide Direct Dial: [phone]  Fax: [phone] Website: [email or web address]

## 2023-10-07 NOTE — Progress Notes (Unsigned)
 Complex Care Management Note Care Guide Note  10/07/2023 Name: [name] [name] MRN: [id number] DOB: 08/17/1955   Complex Care Management Outreach Attempts: A second unsuccessful outreach was attempted today to offer the patient with information about available complex care management services.  Follow Up Plan:  Additional outreach attempts will be made to offer the patient complex care management information and services.   Encounter Outcome:  No Answer  Kandis Ormond, CMA [place]  [facility], [facility] Guide Direct Dial: [phone]  Fax: [phone] Website: [email or web address]

## 2023-10-08 NOTE — Progress Notes (Signed)
 Complex Care Management Note  Care Guide Note 10/08/2023 Name: [name] [name] MRN: [id number] DOB: 1956-05-06  [name] [name] is a 68 y.o. year old male who sees Roslyn Coombe, MD for primary care. I reached out to Margaret Sharp by phone today to offer complex care management services.  [name] was given information about Complex Care Management services today including:   The Complex Care Management services include support from the care team which includes your Nurse Care Manager, Clinical Social Worker, or Pharmacist.  The Complex Care Management team is here to help remove barriers to the health concerns and goals most important to you. Complex Care Management services are voluntary, and the patient may decline or stop services at any time by request to their care team member.   Complex Care Management Consent Status: Patient agreed to services and verbal consent obtained.   Follow up plan:  Telephone appointment with complex care management team member scheduled for:  10/21/2023  Encounter Outcome:  Patient Scheduled  Kandis Ormond, CMA [place]  [facility], [facility] Guide Direct Dial: [phone]  Fax: [phone] Website: [email or web address]

## 2023-10-10 ENCOUNTER — Encounter: Payer: Self-pay | Admitting: Cardiovascular Disease

## 2023-10-11 ENCOUNTER — Encounter: Payer: Self-pay | Admitting: Physician Assistant

## 2023-10-11 ENCOUNTER — Telehealth: Payer: Self-pay | Admitting: Cardiovascular Disease

## 2023-10-11 ENCOUNTER — Other Ambulatory Visit: Payer: Self-pay | Admitting: Nurse Practitioner

## 2023-10-11 MED ORDER — ENTRESTO 97-103 MG PO TABS: 1.0000 | ORAL_TABLET | Freq: Two times a day (BID) | ORAL | 3 refills | Status: AC

## 2023-10-11 NOTE — Telephone Encounter (Signed)
 Pt's medication was sent to pt's pharmacy as requested. Confirmation received.

## 2023-10-11 NOTE — Telephone Encounter (Signed)
*  STAT* If patient is at the pharmacy, call can be transferred to refill team.   1. Which medications need to be refilled? (please list name of each medication and dose if known)   ENTRESTO  97-103 MG    2. Which pharmacy/location (including street and city if local pharmacy) is medication to be sent to? [id number] Jonette Nestle, [place] - [address] AT [facility] [address] Phone: [phone]  Fax: [phone]      3. Do they need a 30 day or 90 day supply? 90

## 2023-10-15 ENCOUNTER — Ambulatory Visit (INDEPENDENT_AMBULATORY_CARE_PROVIDER_SITE_OTHER): Payer: 59

## 2023-10-15 VITALS — Ht 72.0 in | Wt 243.0 lb

## 2023-10-15 DIAGNOSIS — Z87891 Personal history of nicotine dependence: Secondary | ICD-10-CM | POA: Diagnosis not present

## 2023-10-15 DIAGNOSIS — E119 Type 2 diabetes mellitus without complications: Secondary | ICD-10-CM

## 2023-10-15 DIAGNOSIS — H9193 Unspecified hearing loss, bilateral: Secondary | ICD-10-CM

## 2023-10-15 DIAGNOSIS — K08139 Complete loss of teeth due to caries, unspecified class: Secondary | ICD-10-CM | POA: Diagnosis not present

## 2023-10-15 DIAGNOSIS — Z0000 Encounter for general adult medical examination without abnormal findings: Secondary | ICD-10-CM | POA: Diagnosis not present

## 2023-10-15 DIAGNOSIS — Z0100 Encounter for examination of eyes and vision without abnormal findings: Secondary | ICD-10-CM

## 2023-10-15 NOTE — Progress Notes (Signed)
 Subjective:   [name] [name] is a 68 y.o. who presents for a Medicare Wellness preventive visit.  Visit Complete: Virtual I connected with  [name] [name] on 10/15/23 by a audio enabled telemedicine application and verified that I am speaking with the correct person using two identifiers.  Patient Location: Home  Provider Location: Office/Clinic  I discussed the limitations of evaluation and management by telemedicine. The patient expressed understanding and agreed to proceed.  Vital Signs: Because this visit was a virtual/telehealth visit, some criteria may be missing or patient reported. Any vitals not documented were not able to be obtained and vitals that have been documented are patient reported.  VideoDeclined- This patient declined Librarian, academic. Therefore the visit was completed with audio only.  Persons Participating in Visit: Patient.  AWV Questionnaire: No: Patient Medicare AWV questionnaire was not completed prior to this visit.  Cardiac Risk Factors include: advanced age (>60men, >5 women);hypertension;dyslipidemia;diabetes mellitus;male gender;obesity (BMI >30kg/m2)     Objective:    Today's Vitals   10/15/23 1313  Weight: 243 lb (110.2 kg)  Height: 6' (1.829 m)   Body mass index is 32.96 kg/m.     10/15/2023    1:10 PM 07/18/2023    5:31 AM 07/06/2023    8:50 AM 10/17/2022    9:07 PM 10/11/2022    2:35 PM 09/15/2022    9:00 PM 09/15/2022    6:58 PM  Advanced Directives  Does Patient Have a Medical Advance Directive? No No No No No No No  Would patient like information on creating a medical advance directive? Yes (MAU/Ambulatory/Procedural Areas - Information given)  No - Patient declined No - Patient declined No - Patient declined No - Patient declined     Current Medications (verified) Outpatient Encounter Medications as of 10/15/2023  Medication Sig   acetaminophen  (TYLENOL ) 500 MG tablet Take 1,000 mg by mouth every 6 (six)  hours as needed for moderate pain.   albuterol  (VENTOLIN  HFA) 108 (90 Base) MCG/ACT inhaler Inhale 2 puffs into the lungs every 6 (six) hours as needed for wheezing or shortness of breath.   Alcohol Swabs  (B-D SINGLE USE SWABS  BUTTERFLY) PADS Use up to 4 times a day to test blood sugars. Dx: E10.9, E11.9   amiodarone  (PACERONE ) 200 MG tablet TAKE 1 TABLET(200 MG) BY MOUTH DAILY   aspirin  EC 81 MG tablet Take 1 tablet (81 mg total) by mouth daily. Swallow whole.   atorvastatin  (LIPITOR ) 80 MG tablet Take 1 tablet (80 mg total) by mouth every evening.   Blood Glucose Calibration (TRUE METRIX LEVEL 1) Low SOLN Use as needed. Dx: E10.9, E11.9   Blood Glucose Monitoring Suppl (ONETOUCH VERIO FLEX SYSTEM) DEVI 1 Device by Does not apply route in the morning, at noon, in the evening, and at bedtime. E11.9   carvedilol  (COREG ) 6.25 MG tablet TAKE 1 TABLET(6.25 MG) BY MOUTH TWICE DAILY WITH A MEAL   clopidogrel  (PLAVIX ) 75 MG tablet Take 1 tablet (75 mg total) by mouth daily.   dorzolamide-timolol (COSOPT) 2-0.5 % ophthalmic solution Place 1 drop into both eyes See admin instructions. Instill one drop into both eyes twice in the morning and once at night   ezetimibe  (ZETIA ) 10 MG tablet Take 1 tablet (10 mg total) by mouth daily.   fluticasone  (FLOVENT  HFA) 110 MCG/ACT inhaler Inhale 2 puffs into the lungs 2 (two) times daily.   furosemide  (LASIX ) 40 MG tablet Take 1 tablet (40 mg total) by mouth as needed for  fluid or edema. As needed for 3lb weight gain   glucose blood (ONETOUCH VERIO) test strip Use as instructed four times per day E11.9   isosorbide -hydrALAZINE  (BIDIL ) 20-37.5 MG tablet TAKE 1 TABLET BY MOUTH THREE TIMES DAILY   Lancets MISC Use as directed four times per day E11.9   latanoprost (XALATAN) 0.005 % ophthalmic solution 1 drop at bedtime.   mexiletine (MEXITIL ) 250 MG capsule TAKE 1 CAPSULE(250 MG) BY MOUTH EVERY 12 HOURS   nitroGLYCERIN  (NITROSTAT ) 0.4 MG SL tablet Place 1 tablet (0.4 mg  total) under the tongue every 5 (five) minutes as needed for chest pain. N   sacubitril -valsartan  (ENTRESTO ) 97-103 MG Take 1 tablet by mouth 2 (two) times daily.   sertraline  (ZOLOFT ) 100 MG tablet Take 1 tablet (100 mg total) by mouth daily.   spironolactone  (ALDACTONE ) 25 MG tablet Take 1 tablet (25 mg total) by mouth daily.   tirzepatide  (MOUNJARO ) 5 MG/0.5ML Pen Inject 5 mg into the skin once a week.   No facility-administered encounter medications on file as of 10/15/2023.    Allergies (verified) Penicillin g, Sglt2 inhibitors, and Penicillins   History: Past Medical History:  Diagnosis Date   CAD (coronary artery disease)    a. s/p BMS pRCA 2002; b. DES to pRCA & DES p/m RCA 2006; c. PCI/DES OM4 2007; d. PCI/CBA to RCA for ISR 10/2006; e.08/2012 STEMI/Cath/PCI:  LAD 95% >> PCI: Promus Prem DES  //  f. NSTEMI 10/15 >> LHC: mLAD stent ok, dLAD 70, OM1 CTO, OM2 stent ok, dOM2 90, OM3 30-40, dLCx 90, p-mRCA stent ok, mRCA stent ok w/ 60-70 ISR, EF 50% >> med Rx // MV 1/17: no ischemia // Cath ([facility]) 05/2019: RCA 100 (CTO)    Combined systolic and diastolic CHF    a. Myoview  1/17: EF 35%  //  b. Echo 1/17 EF 45-50% // Echo 05/2019: EF 50-55 // Echocardiogram 8/21: EF 50, inf AK, post and mid inf HK, mod LVH, RVSP 25.3, mild to mod LAE, trivial MR    Depression    Diabetes mellitus (HCC)    a. A1c 8.8 08/2012->Metformin  initiated. => b. A1c (9/14): 6.6   Gastroesophageal reflux disease    History of nuclear stress test    a. Myoview  1/17: EF 35%, fixed inferior lateral defect consistent with infarct, no ischemia, intermediate risk   History of pneumonia    History of stroke 01/2021   Zio Monitor 11/22: NSR, avg HR 61; PVCs (1.9%); no AFib, no high grade HB, no ventricular arrhythmias   HTN (hypertension)    Hx of NSTEMI    Hyperlipidemia    Ischemic cardiomyopathy    a. EF 40%; improved to normal;  b. 08/2012 Echo: EF 50-55%, mod LVH.//  c. Echo 9/16: Inf HK, mild LVH, EF 55%, mild LAE,  normal RVF, mild RAE, PASP 35 mmHg  //  d. Echo 1/17: EF 45-50%, inferior HK, mild BAE, PASP 33 mmHg   Obesity    OSA (obstructive sleep apnea)    Does not use CPAP as of 05/2011   Tobacco abuse    Past Surgical History:  Procedure Laterality Date   BIV ICD INSERTION CRT-D N/A 08/14/2022   Procedure: BIV ICD INSERTION CRT-D;  Surgeon: Efraim Grange, MD;  Location: [facility] INVASIVE CV LAB;  Service: Cardiovascular;  Laterality: N/A;   CORONARY ANGIOPLASTY WITH STENT PLACEMENT     CORONARY STENT PLACEMENT  2009   LEFT HEART CATH AND CORONARY ANGIOGRAPHY N/A 08/13/2022  Procedure: LEFT HEART CATH AND CORONARY ANGIOGRAPHY;  Surgeon: Sammy Crisp, MD;  Location: [facility] INVASIVE CV LAB;  Service: Cardiovascular;  Laterality: N/A;   LEFT HEART CATHETERIZATION WITH CORONARY ANGIOGRAM N/A 08/31/2012   Procedure: LEFT HEART CATHETERIZATION WITH CORONARY ANGIOGRAM;  Surgeon: Odie Benne, MD;  Location: [facility] CATH LAB;  Service: Cardiovascular;  Laterality: N/A;   LEFT HEART CATHETERIZATION WITH CORONARY ANGIOGRAM N/A 11/16/2013   Procedure: LEFT HEART CATHETERIZATION WITH CORONARY ANGIOGRAM;  Surgeon: Arlander Bellman, MD;  Location: [facility] CATH LAB;  Service: Cardiovascular;  Laterality: N/A;   LEFT HEART CATHETERIZATION WITH CORONARY ANGIOGRAM N/A 03/15/2014   Procedure: LEFT HEART CATHETERIZATION WITH CORONARY ANGIOGRAM;  Surgeon: Peter M Swaziland, MD;  Location: [facility] CATH LAB;  Service: Cardiovascular;  Laterality: N/A;   PERCUTANEOUS CORONARY STENT INTERVENTION (PCI-S) Right 08/31/2012   Procedure: PERCUTANEOUS CORONARY STENT INTERVENTION (PCI-S);  Surgeon: Odie Benne, MD;  Location: [facility] CATH LAB;  Service: Cardiovascular;  Laterality: Right;   PERCUTANEOUS CORONARY STENT INTERVENTION (PCI-S)  11/16/2013   Procedure: PERCUTANEOUS CORONARY STENT INTERVENTION (PCI-S);  Surgeon: Arlander Bellman, MD;  Location: [facility] CATH LAB;  Service: Cardiovascular;;   Family History  Problem Relation Age of Onset    Depression Mother    Cancer Mother        ovarian   Hypertension Mother        Died, 33   Other Father        motor vehicle accident   Coronary artery disease Other    Heart attack Neg Hx    Stroke Neg Hx    Social History   Socioeconomic History   Marital status: Single    Spouse name: Not on file   Number of children: 0   Years of education: Not on file   Highest education level: 10th grade  Occupational History   Occupation: retired  Tobacco Use   Smoking status: Former    Current packs/day: 0.00    Average packs/day: 1 pack/day for 34.0 years (34.0 ttl pk-yrs)    Types: Cigarettes    Start date: 06/11/1977    Quit date: 06/12/2011    Years since quitting: 12.3    Passive exposure: Past   Smokeless tobacco: Never  Vaping Use   Vaping status: Never Used  Substance and Sexual Activity   Alcohol use: No   Drug use: No    Comment: remote marijuana use   Sexual activity: Not Currently  Other Topics Concern   Not on file  Social History Narrative   Lives alone.  He has no children.   He retired early due to health problems.         Social Drivers of Health   Financial Resource Strain: Medium Risk (10/15/2023)   Overall Financial Resource Strain (CARDIA)    Difficulty of Paying Living Expenses: Somewhat hard  Food Insecurity: No Food Insecurity (10/15/2023)   Hunger Vital Sign    Worried About Running Out of Food in the Last Year: Never true    Ran Out of Food in the Last Year: Never true  Transportation Needs: No Transportation Needs (10/15/2023)   PRAPARE - Administrator, Civil Service (Medical): No    Lack of Transportation (Non-Medical): No  Physical Activity: Insufficiently Active (10/15/2023)   Exercise Vital Sign    Days of Exercise per Week: 1 day    Minutes of Exercise per Session: 10 min  Stress: Stress Concern Present (10/15/2023)   [employer] of Occupational Health -  Occupational Stress Questionnaire    Feeling of Stress : Rather much   Social Connections: Moderately Integrated (10/15/2023)   Social Connection and Isolation Panel [NHANES]    Frequency of Communication with Friends and Family: More than three times a week    Frequency of Social Gatherings with Friends and Family: More than three times a week    Attends Religious Services: More than 4 times per year    Active Member of [employer] or Organizations: Yes    Attends Engineer, structural: More than 4 times per year    Marital Status: Never married    Tobacco Counseling Counseling given: No    Clinical Intake:  Pre-visit preparation completed: Yes  Pain : No/denies pain     BMI - recorded: 32.96 Nutritional Status: BMI > 30  Obese Nutritional Risks: None Diabetes: Yes CBG done?: No Did pt. bring in CBG monitor from home?: No  Lab Results  Component Value Date   HGBA1C 6.2 08/06/2023   HGBA1C 6.2 11/21/2022   HGBA1C 7.3 (H) 07/27/2022     How often do you need to have someone help you when you read instructions, pamphlets, or other written materials from your doctor or pharmacy?: 1 - Never  Interpreter Needed?: No  Information entered by :: Kandy Orris, CMA   Activities of Daily Living     10/15/2023    1:20 PM  In your present state of health, do you have any difficulty performing the following activities:  Hearing? 0  Vision? 0  Difficulty concentrating or making decisions? 0  Walking or climbing stairs? 0  Dressing or bathing? 0  Doing errands, shopping? 0  Preparing Food and eating ? N  Using the Toilet? N  In the past six months, have you accidently leaked urine? N  Do you have problems with loss of bowel control? N  Managing your Medications? N  Managing your Finances? N  Housekeeping or managing your Housekeeping? N    Patient Care Team: Roslyn Coombe, MD as PCP - General (Internal Medicine) Arnoldo Lapping, MD as PCP - Cardiology (Cardiology) Mealor, Donnamae Gaba, MD as PCP - Electrophysiology (Cardiology) Arnoldo Lapping, MD as Consulting Physician (Cardiology) Sherwood Donath as Physician Assistant (Cardiology) Jacqueline Matsu, MD as Consulting Physician (Sleep Medicine) Szabat, Tino Foreman, [facility] (Inactive) as Pharmacist (Pharmacist) Ruffin Cotton, DPM as Consulting Physician (Podiatry) Brigitte Canard, PA-C as Physician Assistant (Physician Assistant)  Indicate any recent Medical Services you may have received from other than [facility] providers in the past year (date may be approximate).     Assessment:   This is a routine wellness examination for [facility].  Hearing/Vision screen Hearing Screening - Comments:: Referral to Audiologist - c/o hearing difficulty Vision Screening - Comments:: Referral to an Opthalmologist   Goals Addressed               This Visit's Progress     Patient Stated (pt-stated)        Patient stated he plans to walk (move) more.  Goal is to continue manage medication and monitor bp readings and sugar levels.       Depression Screen     10/15/2023    1:33 PM 09/25/2023   10:16 AM 08/06/2023    8:17 AM 01/25/2023    8:20 AM 10/11/2022    2:37 PM 09/28/2022    9:03 AM 08/21/2022    9:48 AM  PHQ 2/9 Scores  PHQ - 2 Score 2 0 0  0 0 0 0  PHQ- 9 Score 8    0      Fall Risk     10/15/2023    1:24 PM 09/25/2023   10:19 AM 08/06/2023    8:25 AM 01/25/2023    8:20 AM 10/11/2022    2:36 PM  Fall Risk   Falls in the past year? 0 0 0 0 0  Number falls in past yr: 0 0 0 0 0  Injury with Fall? 0 0 0 0 0  Risk for fall due to : No Fall Risks No Fall Risks No Fall Risks No Fall Risks No Fall Risks  Follow up Falls prevention discussed;Falls evaluation completed Falls evaluation completed Falls evaluation completed Falls evaluation completed Falls prevention discussed    MEDICARE RISK AT HOME:  Medicare Risk at Home Any stairs in or around the home?: Yes (outside) If so, are there any without handrails?: No Home free of loose throw rugs in walkways, pet beds, electrical  cords, etc?: Yes Adequate lighting in your home to reduce risk of falls?: Yes Life alert?: No Use of a cane, walker or w/c?: Yes (cane) Grab bars in the bathroom?: Yes Shower chair or bench in shower?: Yes Elevated toilet seat or a handicapped toilet?: Yes  TIMED UP AND GO:  Was the test performed?  No  Cognitive Function: 6CIT completed    03/29/2017    8:45 AM  MMSE - Mini Mental State Exam  Orientation to time 5  Orientation to Place 5  Registration 3  Attention/ Calculation 5  Recall 2  Language- name 2 objects 2  Language- repeat 1  Language- follow 3 step command 3  Language- read & follow direction 1  Write a sentence 1  Copy design 1  Total score 29        10/15/2023    1:25 PM 10/11/2022    2:46 PM 09/20/2021    4:34 PM  6CIT Screen  What Year? 0 points 0 points 0 points  What month? 0 points 0 points 0 points  What time? 0 points 0 points 0 points  Count back from 20 0 points 0 points 0 points  Months in reverse 4 points 0 points 0 points  Repeat phrase 0 points 0 points 0 points  Total Score 4 points 0 points 0 points    Immunizations Immunization History  Administered Date(s) Administered   Fluad Quad(high Dose 65+) 02/22/2022   Fluad Trivalent(High Dose 65+) 08/06/2023   Influenza Whole 04/12/2008, 04/12/2010, 02/10/2011   Influenza, Seasonal, Injecte, Preservative Fre 03/11/2013   Influenza,inj,Quad PF,6+ Mos 02/12/2015, 05/04/2016, 03/29/2017, 05/05/2018, 05/15/2019, 03/22/2020, 07/12/2021   Moderna Sars-Covid-2 Vaccination 11/03/2019, 12/01/2019, 06/20/2020   PFIZER(Purple Top)SARS-COV-2 Vaccination 11/10/2019   Pfizer(Comirnaty)Fall Seasonal Vaccine 12 years and older 04/17/2022   Pneumococcal Conjugate-13 06/26/2013   Pneumococcal Polysaccharide-23 07/21/2009, 06/27/2015, 07/27/2022   Td 03/24/2009   Zoster Recombinant(Shingrix) 04/02/2017    Screening Tests Health Maintenance  Topic Date Due   Zoster Vaccines- Shingrix (2 of 2)  05/28/2017   DTaP/Tdap/Td (2 - Tdap) 03/25/2019   Lung Cancer Screening  08/24/2020   Colonoscopy  03/17/2022   COVID-19 Vaccine (6 - 2024-25 season) 02/10/2023   Diabetic kidney evaluation - Urine ACR  07/28/2023   OPHTHALMOLOGY EXAM  09/24/2023   INFLUENZA VACCINE  01/10/2024   HEMOGLOBIN A1C  02/03/2024   FOOT EXAM  08/05/2024   Diabetic kidney evaluation - eGFR measurement  09/27/2024   Medicare Annual Wellness (AWV)  10/14/2024  Pneumonia Vaccine 3+ Years old  Completed   Hepatitis C Screening  Completed   HPV VACCINES  Aged Out   Meningococcal B Vaccine  Aged Out    Health Maintenance  Health Maintenance Due  Topic Date Due   Zoster Vaccines- Shingrix (2 of 2) 05/28/2017   DTaP/Tdap/Td (2 - Tdap) 03/25/2019   Lung Cancer Screening  08/24/2020   Colonoscopy  03/17/2022   COVID-19 Vaccine (6 - 2024-25 season) 02/10/2023   Diabetic kidney evaluation - Urine ACR  07/28/2023   OPHTHALMOLOGY EXAM  09/24/2023   Health Maintenance Items Addressed:  Referral sent to GI for colonoscopy, Lung Cancer Screening ordered, Referral sent to Optometry/Ophthalmology.   Referral to a Dentist for a routine dental cleaning/exam.  Referral w/Tina Marcille Severance to have repeat Colonoscopy in place.  Additional Screening:  Vision Screening: Recommended annual ophthalmology exams for early detection of glaucoma and other disorders of the eye.  Dental Screening: Recommended annual dental exams for proper oral hygiene  Community Resource Referral / Chronic Care Management: CRR required this visit?  No   CCM required this visit?  No     Plan:     I have personally reviewed and noted the following in the patient's chart:   Medical and social history Use of alcohol, tobacco or illicit drugs  Current medications and supplements including opioid prescriptions. Patient is not currently taking opioid prescriptions. Functional ability and status Nutritional status Physical  activity Advanced directives List of other physicians Hospitalizations, surgeries, and ER visits in previous 12 months Vitals Screenings to include cognitive, depression, and falls Referrals and appointments  In addition, I have reviewed and discussed with patient certain preventive protocols, quality metrics, and best practice recommendations. A written personalized care plan for preventive services as well as general preventive health recommendations were provided to patient.     Patria Bookbinder, CMA   10/15/2023   After Visit Summary: (MyChart) Due to this being a telephonic visit, the after visit summary with patients personalized plan was offered to patient via MyChart   Notes: Please refer to Routing Comments.

## 2023-10-15 NOTE — Patient Instructions (Addendum)
 [name] , Thank you for taking time to come for your Medicare Wellness Visit. I appreciate your ongoing commitment to your health goals. Please review the following plan we discussed and let me know if I can assist you in the future.   Referrals/Orders/Follow-Ups/Clinician Recommendations: Aim for 30 minutes of exercise or brisk walking, 6-8 glasses of water, and 5 servings of fruits and vegetables each day. Ordered a Lung Cancer Screening test in addition to referrals to an Audiologist, an Ophthalmologist, and a Dentist.  Patient is scheduled w/Tina Marcille Severance in 11/2023 to discuss having a repeat Colonoscopy.  Patient has the new patient forms to complete prior to getting an appointment w/Behavioral Health.  The office has called inquiring of the forms to schedule.  Patient is aware of getting completed ASAP.  This is a list of the screening recommended for you and due dates:  Health Maintenance  Topic Date Due   Zoster (Shingles) Vaccine (2 of 2) 05/28/2017   DTaP/Tdap/Td vaccine (2 - Tdap) 03/25/2019   Screening for Lung Cancer  08/24/2020   Colon Cancer Screening  03/17/2022   COVID-19 Vaccine (6 - 2024-25 season) 02/10/2023   Yearly kidney health urinalysis for diabetes  07/28/2023   Eye exam for diabetics  09/24/2023   Flu Shot  01/10/2024   Hemoglobin A1C  02/03/2024   Complete foot exam   08/05/2024   Yearly kidney function blood test for diabetes  09/27/2024   Medicare Annual Wellness Visit  10/14/2024   Pneumonia Vaccine  Completed   Hepatitis C Screening  Completed   HPV Vaccine  Aged Out   Meningitis B Vaccine  Aged Out    Advanced directives: (Provided) Advance directive discussed with you today. I have provided a copy for you to complete at home and have notarized. Once this is complete, please bring a copy in to our office so we can scan it into your chart.   Next Medicare Annual Wellness Visit scheduled for next year: Yes

## 2023-10-21 ENCOUNTER — Telehealth: Payer: Self-pay | Admitting: *Deleted

## 2023-10-25 ENCOUNTER — Encounter (HOSPITAL_COMMUNITY): Admitting: Cardiology

## 2023-10-29 ENCOUNTER — Telehealth: Payer: Self-pay | Admitting: Acute Care

## 2023-10-29 DIAGNOSIS — Z122 Encounter for screening for malignant neoplasm of respiratory organs: Secondary | ICD-10-CM

## 2023-10-29 DIAGNOSIS — Z87891 Personal history of nicotine dependence: Secondary | ICD-10-CM

## 2023-10-29 NOTE — Telephone Encounter (Signed)
 Lung Cancer Screening Narrative/Criteria Questionnaire (Cigarette Smokers Only- No Cigars/Pipes/vapes)   [name] [name]   SDMV:11/14/2023 3:00 [name]    [birth year]   LDCT: 11/15/2023 8:00 GI    68 y.o.   Phone: [phone]  Lung Screening Narrative (confirm age 27-77 yrs Medicare / 50-80 yrs Private pay insurance)   Insurance information:[employer] mcr   Referring Provider:Dr. Autry Legions   This screening involves an initial phone call with a team member from our program. It is called a shared decision making visit. The initial meeting is required by  insurance and Medicare to make sure you understand the program. This appointment takes about 15-20 minutes to complete. You will complete the screening scan at your scheduled date/time.  This scan takes about 5-10 minutes to complete. You can eat and drink normally before and after the scan.  Criteria questions for Lung Cancer Screening:   Are you a current or former smoker? Former Age began smoking: 68yo   If you are a former smoker, what year did you quit smoking? 2015. Patient also quit for 2 years prior to 2015 but then start back up again. (within 15 yrs)   To calculate your smoking history, I need an accurate estimate of how many packs of cigarettes you smoked per day and for how many years. (Not just the number of PPD you are now smoking)   Years smoking 38 x Packs per day 3/4 = Pack years 28.5   (at least 20 pack yrs)   (Make sure they understand that we need to know how much they have smoked in the past, not just the number of PPD they are smoking now)  Do you have a personal history of cancer?  No    Do you have a family history of cancer? Yes  (cancer type and and relative) Mother - ovarian   Are you coughing up blood?  No  Have you had unexplained weight loss of 15 lbs or more in the last 6 months? No  It looks like you meet all criteria.  When would be a good time for us  to schedule you for this screening?   Additional information:  N/A

## 2023-11-01 ENCOUNTER — Encounter: Payer: Self-pay | Admitting: Podiatry

## 2023-11-01 ENCOUNTER — Ambulatory Visit (INDEPENDENT_AMBULATORY_CARE_PROVIDER_SITE_OTHER): Payer: 59 | Admitting: Podiatry

## 2023-11-01 DIAGNOSIS — L84 Corns and callosities: Secondary | ICD-10-CM | POA: Diagnosis not present

## 2023-11-01 DIAGNOSIS — E1159 Type 2 diabetes mellitus with other circulatory complications: Secondary | ICD-10-CM

## 2023-11-01 DIAGNOSIS — M79674 Pain in right toe(s): Secondary | ICD-10-CM

## 2023-11-01 DIAGNOSIS — B351 Tinea unguium: Secondary | ICD-10-CM

## 2023-11-01 DIAGNOSIS — M79675 Pain in left toe(s): Secondary | ICD-10-CM

## 2023-11-01 NOTE — Progress Notes (Signed)
 This patient returns to my office for at risk foot care.  This patient requires this care by a professional since this patient will be at risk due to having pre-ulcerative calluses, CKD and diabetic neuropathy and coagulation defect.  Patient is taking plavix.  This patient is unable to cut nails himself since the patient cannot reach his nails.These nails are painful walking and wearing shoes.  He also has callus on his right forefoot and heel. This patient presents for at risk foot care today.    General Appearance  Alert, conversant and in no acute stress.  Vascular  Dorsalis pedis and posterior tibial  pulses are  weakly  palpable  bilaterally.  Capillary return is within normal limits  bilaterally. Temperature is within normal limits  bilaterally.  Neurologic  Senn-Weinstein monofilament wire test diminished   bilaterally. Muscle power within normal limits bilaterally.  Nails Thick disfigured discolored nails with subungual debris  from hallux to fifth toes bilaterally. No evidence of bacterial infection or drainage bilaterally.  Orthopedic  No limitations of motion  feet .  No crepitus or effusions noted.  No bony pathology or digital deformities noted.  Plantar flexed second metatarsal  B/L.  Skin  normotropic skin  noted bilaterally.  No signs of infections or ulcers noted.   Pre-ulcerous callus sub 2  B/L symptomatic,  Callus sub 5 left foot.  Onychomycosis  Pain in right toes  Pain in left toes  Porokeratosis/Pre-ulcerous callus  B/L.  Consent was obtained for treatment procedures.   Mechanical debridement of nails 1-5  bilaterally performed with a nail nipper.  Filed with dremel without incident.  Debride pre-ulcerous calluses with # 15 blade.  Debride  callus with # 15 blade and dremel tool.   Return office visit     10 weeks               Told patient to return for periodic foot care and evaluation due to potential at risk complications.   Helane Gunther DPM

## 2023-11-06 ENCOUNTER — Encounter: Payer: Self-pay | Admitting: *Deleted

## 2023-11-06 ENCOUNTER — Telehealth: Payer: Self-pay | Admitting: *Deleted

## 2023-11-07 ENCOUNTER — Ambulatory Visit: Attending: Internal Medicine | Admitting: Audiologist

## 2023-11-12 ENCOUNTER — Ambulatory Visit (INDEPENDENT_AMBULATORY_CARE_PROVIDER_SITE_OTHER): Admitting: Psychology

## 2023-11-12 DIAGNOSIS — F32A Depression, unspecified: Secondary | ICD-10-CM | POA: Diagnosis not present

## 2023-11-12 DIAGNOSIS — F419 Anxiety disorder, unspecified: Secondary | ICD-10-CM

## 2023-11-12 NOTE — Progress Notes (Signed)
 [facility] Counselor Initial Adult Exam  Name: [name] [name] Date: 11/12/2023 MRN: [id number] DOB: 12-04-1955 PCP: Roslyn Coombe, MD  Time Spent: 9:03  am - 9:52 am : 49 Minutes  Guardian/Payee:  self    Paperwork requested: No   Reason for Visit /Presenting Problem: depression and anxiety.   Mental Status Exam: Appearance:   Casual     Behavior:  Appropriate  Motor:  Normal  Speech/Language:   Clear and Coherent  Affect:  Congruent  Mood:  dysthymic  Thought process:  normal  Thought content:    WNL  Sensory/Perceptual disturbances:    WNL  Orientation:  oriented to person, place, time/date, and situation  Attention:  Good  Concentration:  Good  Memory:  WNL  Fund of knowledge:   Good  Insight:    Good  Judgment:   Good  Impulse Control:  Good   Reported Symptoms:  Depression and anxiety.   Risk Assessment: Danger to Self:  No Self-injurious Behavior: No Danger to Others: No Duty to Warn:no Physical Aggression / Violence:No  Access to Firearms a concern: No  Gang Involvement:No  Patient / guardian was educated about steps to take if suicide or homicide risk level increases between visits: no While future psychiatric events cannot be accurately predicted, the patient does not currently require acute inpatient psychiatric care and does not currently meet [place]  involuntary commitment criteria.  Substance Abuse History: Current substance abuse: No     Caffeine: I coffee a day. 1 Sprite.  Tobacco: denied.  Alcohol: very sporadic.  Substance use: denied.   Past Psychiatric History:   Previous psychological history is significant for anxiety and depression Outpatient Providers: Therapy with [facility] Provider many years ago and was helpful.  History of Psych Hospitalization: No  Psychological Testing: NABretta Camp noted his mother had depression.   Abuse History:  Victim of: No., na   Report needed: No. Victim of  Neglect:No. Perpetrator of na  Witness / Exposure to Domestic Violence: No   Protective Services Involvement: No  Witness to [employer] Violence:  No   Family History:  Family History  Problem Relation Age of Onset   Depression Mother    Cancer Mother        ovarian   Hypertension Mother        Died, 9   Other Father        motor vehicle accident   Coronary artery disease Other    Heart attack Neg Hx    Stroke Neg Hx     Living situation: the patient lives alone  Sexual Orientation: Straight  Relationship Status: single  Name of spouse / other: NA  If a parent, number of children / ages:NA   Support Systems: friends: Berkley Breech (friend) and Donavon Fudge from [place].   Financial Stress:  Yes  with utility bills.   Income/Employment/Disability: retired.   Military Service: No   Educational History: Education: high school diploma/GED: "I am considered illiterate". "I can write but my reading is really bad".  Religion/Sprituality/World View: Christian.   Any cultural differences that may affect / interfere with treatment:  not applicable   Recreation/Hobbies: Previously Golf, yard Airline pilot. ("I mostly sit in bed")  Stressors: Financial difficulties  , aging.   Strengths: Spirituality, Hopefulness, Self Advocate, and Able to Communicate Effectively  Barriers:  mood.   Legal History: Pending legal issue / charges: The patient has no significant history of legal issues. History of legal issue /  charges: No  Medical History/Surgical History: reviewed Past Medical History:  Diagnosis Date   CAD (coronary artery disease)    a. s/p BMS pRCA 2002; b. DES to pRCA & DES p/m RCA 2006; c. PCI/DES OM4 2007; d. PCI/CBA to RCA for ISR 10/2006; e.08/2012 STEMI/Cath/PCI:  LAD 95% >> PCI: Promus Prem DES  //  f. NSTEMI 10/15 >> LHC: mLAD stent ok, dLAD 70, OM1 CTO, OM2 stent ok, dOM2 90, OM3 30-40, dLCx 90, p-mRCA stent ok, mRCA stent ok w/ 60-70 ISR, EF 50% >> med Rx // MV 1/17: no  ischemia // Cath ([facility]) 05/2019: RCA 100 (CTO)    Combined systolic and diastolic CHF    a. Myoview  1/17: EF 35%  //  b. Echo 1/17 EF 45-50% // Echo 05/2019: EF 50-55 // Echocardiogram 8/21: EF 50, inf AK, post and mid inf HK, mod LVH, RVSP 25.3, mild to mod LAE, trivial MR    Depression    Diabetes mellitus (HCC)    a. A1c 8.8 08/2012->Metformin  initiated. => b. A1c (9/14): 6.6   Gastroesophageal reflux disease    History of nuclear stress test    a. Myoview  1/17: EF 35%, fixed inferior lateral defect consistent with infarct, no ischemia, intermediate risk   History of pneumonia    History of stroke 01/2021   Zio Monitor 11/22: NSR, avg HR 61; PVCs (1.9%); no AFib, no high grade HB, no ventricular arrhythmias   HTN (hypertension)    Hx of NSTEMI    Hyperlipidemia    Ischemic cardiomyopathy    a. EF 40%; improved to normal;  b. 08/2012 Echo: EF 50-55%, mod LVH.//  c. Echo 9/16: Inf HK, mild LVH, EF 55%, mild LAE, normal RVF, mild RAE, PASP 35 mmHg  //  d. Echo 1/17: EF 45-50%, inferior HK, mild BAE, PASP 33 mmHg   Obesity    OSA (obstructive sleep apnea)    Does not use CPAP as of 05/2011   Tobacco abuse     Past Surgical History:  Procedure Laterality Date   BIV ICD INSERTION CRT-D N/A 08/14/2022   Procedure: BIV ICD INSERTION CRT-D;  Surgeon: Efraim Grange, MD;  Location: [facility] INVASIVE CV LAB;  Service: Cardiovascular;  Laterality: N/A;   CORONARY ANGIOPLASTY WITH STENT PLACEMENT     CORONARY STENT PLACEMENT  2009   LEFT HEART CATH AND CORONARY ANGIOGRAPHY N/A 08/13/2022   Procedure: LEFT HEART CATH AND CORONARY ANGIOGRAPHY;  Surgeon: Sammy Crisp, MD;  Location: [facility] INVASIVE CV LAB;  Service: Cardiovascular;  Laterality: N/A;   LEFT HEART CATHETERIZATION WITH CORONARY ANGIOGRAM N/A 08/31/2012   Procedure: LEFT HEART CATHETERIZATION WITH CORONARY ANGIOGRAM;  Surgeon: Odie Benne, MD;  Location: [facility] CATH LAB;  Service: Cardiovascular;  Laterality: N/A;   LEFT HEART  CATHETERIZATION WITH CORONARY ANGIOGRAM N/A 11/16/2013   Procedure: LEFT HEART CATHETERIZATION WITH CORONARY ANGIOGRAM;  Surgeon: Arlander Bellman, MD;  Location: [facility] CATH LAB;  Service: Cardiovascular;  Laterality: N/A;   LEFT HEART CATHETERIZATION WITH CORONARY ANGIOGRAM N/A 03/15/2014   Procedure: LEFT HEART CATHETERIZATION WITH CORONARY ANGIOGRAM;  Surgeon: Peter M Swaziland, MD;  Location: [facility] CATH LAB;  Service: Cardiovascular;  Laterality: N/A;   PERCUTANEOUS CORONARY STENT INTERVENTION (PCI-S) Right 08/31/2012   Procedure: PERCUTANEOUS CORONARY STENT INTERVENTION (PCI-S);  Surgeon: Odie Benne, MD;  Location: [facility] CATH LAB;  Service: Cardiovascular;  Laterality: Right;   PERCUTANEOUS CORONARY STENT INTERVENTION (PCI-S)  11/16/2013   Procedure: PERCUTANEOUS CORONARY STENT INTERVENTION (PCI-S);  Surgeon: Michael D  Arlester Ladd, MD;  Location: [facility] CATH LAB;  Service: Cardiovascular;;    Medications: Current Outpatient Medications  Medication Sig Dispense Refill   acetaminophen  (TYLENOL ) 500 MG tablet Take 1,000 mg by mouth every 6 (six) hours as needed for moderate pain.     albuterol  (VENTOLIN  HFA) 108 (90 Base) MCG/ACT inhaler Inhale 2 puffs into the lungs every 6 (six) hours as needed for wheezing or shortness of breath. 8 g 11   Alcohol Swabs  (B-D SINGLE USE SWABS  BUTTERFLY) PADS Use up to 4 times a day to test blood sugars. Dx: E10.9, E11.9 100 each 11   amiodarone  (PACERONE ) 200 MG tablet TAKE 1 TABLET(200 MG) BY MOUTH DAILY 90 tablet 3   aspirin  EC 81 MG tablet Take 1 tablet (81 mg total) by mouth daily. Swallow whole. 90 tablet 3   atorvastatin  (LIPITOR ) 80 MG tablet Take 1 tablet (80 mg total) by mouth every evening. 90 tablet 3   Blood Glucose Calibration (TRUE METRIX LEVEL 1) Low SOLN Use as needed. Dx: E10.9, E11.9 1 each 0   Blood Glucose Monitoring Suppl (ONETOUCH VERIO FLEX SYSTEM) DEVI 1 Device by Does not apply route in the morning, at noon, in the evening, and at bedtime. E11.9 1  each 0   carvedilol  (COREG ) 6.25 MG tablet TAKE 1 TABLET(6.25 MG) BY MOUTH TWICE DAILY WITH A MEAL 180 tablet 3   clopidogrel  (PLAVIX ) 75 MG tablet Take 1 tablet (75 mg total) by mouth daily. 90 tablet 3   dorzolamide-timolol (COSOPT) 2-0.5 % ophthalmic solution Place 1 drop into both eyes See admin instructions. Instill one drop into both eyes twice in the morning and once at night     ezetimibe  (ZETIA ) 10 MG tablet Take 1 tablet (10 mg total) by mouth daily. 90 tablet 3   fluticasone  (FLOVENT  HFA) 110 MCG/ACT inhaler Inhale 2 puffs into the lungs 2 (two) times daily. 1 each 12   furosemide  (LASIX ) 40 MG tablet Take 1 tablet (40 mg total) by mouth as needed for fluid or edema. As needed for 3lb weight gain 30 tablet 10   glucose blood (ONETOUCH VERIO) test strip Use as instructed four times per day E11.9 400 each 12   isosorbide -hydrALAZINE  (BIDIL ) 20-37.5 MG tablet TAKE 1 TABLET BY MOUTH THREE TIMES DAILY 270 tablet 2   Lancets MISC Use as directed four times per day E11.9 400 each 3   latanoprost (XALATAN) 0.005 % ophthalmic solution 1 drop at bedtime.     mexiletine (MEXITIL ) 250 MG capsule TAKE 1 CAPSULE(250 MG) BY MOUTH EVERY 12 HOURS 180 capsule 3   nitroGLYCERIN  (NITROSTAT ) 0.4 MG SL tablet Place 1 tablet (0.4 mg total) under the tongue every 5 (five) minutes as needed for chest pain. N 25 tablet 3   sacubitril -valsartan  (ENTRESTO ) 97-103 MG Take 1 tablet by mouth 2 (two) times daily. 180 tablet 3   sertraline  (ZOLOFT ) 100 MG tablet Take 1 tablet (100 mg total) by mouth daily. 90 tablet 3   spironolactone  (ALDACTONE ) 25 MG tablet Take 1 tablet (25 mg total) by mouth daily. 90 tablet 3   tirzepatide  (MOUNJARO ) 5 MG/0.5ML Pen Inject 5 mg into the skin once a week. 6 mL 3   No current facility-administered medications for this visit.    Allergies  Allergen Reactions   Penicillin G Other (See Comments)    Did it involve swelling of the face/tongue/throat, SOB, or low BP?  N/A Did it  involve sudden or severe rash/hives, skin peeling, or any reaction  on the inside of your mouth or nose? N/A Did you need to seek medical attention at a hospital or doctor's office? N/A When did it last happen? Child If all above answers are "NO", may proceed with cephalosporin use.   Sglt2 Inhibitors Other (See Comments)    Hospn April 2024 with UTI, DKA   Penicillins Other (See Comments)    Did it involve swelling of the face/tongue/throat, SOB, or low BP? N/A Did it involve sudden or severe rash/hives, skin peeling, or any reaction on the inside of your mouth or nose?N/A Did you need to seek medical attention at a hospital or doctor's office? N/A When did it last happen? Child      If all above answers are "NO", may proceed with cephalosporin use.    Diagnoses:  Depression, unspecified depression type  Anxiety  Psychiatric Treatment: Yes , via PCP. Please see chart.   Plan of Care: OPT and continued medication management with PCP.   Narrative:  Margaret Sharp participated from office with therapist and consented to treatment. We reviewed the limits of confidentiality prior to the start of the evaluation. Ksean Vale expressed understanding and agreement to proceed. Kayveon was referred by his Rosalia Colonel, MD. He noted that "a couple of weeks a go" he noted staying in bed and endorsed being lethargic. He noted meeting with his provider and was prescribed Sertaline 100mg  every day and takes his medication consistently and without concern. He noted some improvement in his mood since that time but a continued depressed state. He noted his depression being cyclical and noted that during the pandemic "it was really bad". He endorsed previous discomfort with leaving the home but noted that this has improved somewhat. His depressive symptoms include loss of interest, feeling down, poor sleep (middle insomnia ~6 hours), feeling bad about self, and trouble concentrating. His anxiety symptoms  include feeling anxious, difficulty managing worry, worrying about different things trouble relaxing, restlessness, irritability, and feeling afraid something awful might happen. He noted having sleep apnea but noted not following up with procuring the device.  He noted most recently having a CPAP ~ 7 years ago. He noted difficulty leaving the home and noted this still being difficult to manage. He noted thinking "weird things" when having to leave the home. He noted feeling "uncomfortable going through the door". Additionally, he noted feeling anxious about driving. Cray noted noted being in special ed due to [place] and math. His stressors include finances, his mood, finances, and the state of his home. He endorsed more depressive symptoms during the winter and noted improvement during springtime. He noted his anxiety often being a barrier to task engagement and noted often not attempting the task. Some improvement in maintaining the home since he started his medication. He noted not having a large social circle and noted a friend who passed ~ I year ago. He noted during the evaluation that he is "scared of everything". Medardo would benefit from counseling to address his mood, bolster coping skills, verbalize thoughts and feelings, address symptoms, expand social circle, and engage in consistent self-care. Tyrrell was engaged, well spoken, and motivated for change. A follow-up was scheduled to create a treatment plan and begin treatment. Therapist answered  and all questions during the evaluation and contact information was provided.   GAD-7: 16 PHQ-9: [address] LCSW

## 2023-11-13 ENCOUNTER — Other Ambulatory Visit: Payer: Self-pay | Admitting: Nurse Practitioner

## 2023-11-14 ENCOUNTER — Encounter: Payer: Self-pay | Admitting: *Deleted

## 2023-11-14 ENCOUNTER — Ambulatory Visit (INDEPENDENT_AMBULATORY_CARE_PROVIDER_SITE_OTHER): Payer: 59

## 2023-11-14 ENCOUNTER — Ambulatory Visit: Admitting: *Deleted

## 2023-11-14 DIAGNOSIS — Z87891 Personal history of nicotine dependence: Secondary | ICD-10-CM

## 2023-11-14 DIAGNOSIS — I255 Ischemic cardiomyopathy: Secondary | ICD-10-CM | POA: Diagnosis not present

## 2023-11-14 NOTE — Patient Instructions (Signed)

## 2023-11-14 NOTE — Progress Notes (Signed)
  Virtual Visit via Telephone Note  I connected with [name] [name] on 11/14/23 at  3:00 PM EDT by telephone and verified that I am speaking with the correct person using two identifiers.  Location: Patient: in home Provider: [address] [place], [place], [address]   Shared Decision Making Visit Lung Cancer Screening Program [phone])   Eligibility: Age 68 y.o. Pack Years Smoking History Calculation 28.5 (# packs/per year x # years smoked) Recent History of coughing up blood  no Unexplained weight loss? no ( >Than 15 pounds within the last 6 months ) Prior History Lung / other cancer no (Diagnosis within the last 5 years already requiring surveillance chest CT Scans). Smoking Status Former Smoker Former Smokers: Years since quit: 10 years  Quit Date: 2015  Visit Components: Discussion included one or more decision making aids. yes Discussion included risk/benefits of screening. yes Discussion included potential follow up diagnostic testing for abnormal scans. yes Discussion included meaning and risk of over diagnosis. yes Discussion included meaning and risk of False Positives. yes Discussion included meaning of total radiation exposure. yes  Counseling Included: Importance of adherence to annual lung cancer LDCT screening. yes Impact of comorbidities on ability to participate in the program. yes Ability and willingness to under diagnostic treatment. yes  Smoking Cessation Counseling: Current Smokers:  Discussed importance of smoking cessation. yes Information about tobacco cessation classes and interventions provided to patient. yes Patient provided with "ticket" for LDCT Scan. yes Symptomatic Patient. no  Counseling NA Diagnosis Code: Tobacco Use Z72.0 Asymptomatic Patient yes  Counseling (Intermediate counseling: > three minutes counseling) W0981 Former Smokers:  Discussed the importance of maintaining cigarette abstinence. yes Diagnosis Code: Personal  History of Nicotine  Dependence. X91.478 Information about tobacco cessation classes and interventions provided to patient. Yes Patient provided with "ticket" for LDCT Scan. yes Written Order for Lung Cancer Screening with LDCT placed in Epic. Yes (CT Chest Lung Cancer Screening Low Dose W/O CM) [id number] Z12.2-Screening of respiratory organs Z87.891-Personal history of nicotine  dependence   Valentin Gaskins RN 11/14/23

## 2023-11-15 ENCOUNTER — Ambulatory Visit: Payer: Self-pay | Admitting: Cardiovascular Disease

## 2023-11-15 ENCOUNTER — Inpatient Hospital Stay: Admission: RE | Admit: 2023-11-15 | Source: Ambulatory Visit

## 2023-11-15 LAB — CUP PACEART REMOTE DEVICE CHECK
Battery Remaining Longevity: 82 mo
Battery Remaining Percentage: 82 %
Battery Voltage: 2.99 V
Brady Statistic AP VP Percent: 80 %
Brady Statistic AP VS Percent: 1 %
Brady Statistic AS VP Percent: 19 %
Brady Statistic AS VS Percent: 1 %
Brady Statistic RA Percent Paced: 80 %
Date Time Interrogation Session: 20250605112013
HighPow Impedance: 66 Ohm
Implantable Lead Connection Status: 753985
Implantable Lead Connection Status: 753985
Implantable Lead Connection Status: 753985
Implantable Lead Implant Date: 20240305
Implantable Lead Implant Date: 20240305
Implantable Lead Implant Date: 20240305
Implantable Lead Location: 753858
Implantable Lead Location: 753859
Implantable Lead Location: 753860
Implantable Pulse Generator Implant Date: 20240305
Lead Channel Impedance Value: 400 Ohm
Lead Channel Impedance Value: 440 Ohm
Lead Channel Impedance Value: 830 Ohm
Lead Channel Pacing Threshold Amplitude: 0.5 V
Lead Channel Pacing Threshold Amplitude: 0.75 V
Lead Channel Pacing Threshold Amplitude: 1 V
Lead Channel Pacing Threshold Pulse Width: 0.5 ms
Lead Channel Pacing Threshold Pulse Width: 0.5 ms
Lead Channel Pacing Threshold Pulse Width: 0.5 ms
Lead Channel Sensing Intrinsic Amplitude: 3.6 mV
Lead Channel Sensing Intrinsic Amplitude: 6 mV
Lead Channel Setting Pacing Amplitude: 1 V
Lead Channel Setting Pacing Amplitude: 1.5 V
Lead Channel Setting Pacing Amplitude: 2 V
Lead Channel Setting Pacing Pulse Width: 0.5 ms
Lead Channel Setting Pacing Pulse Width: 0.5 ms
Lead Channel Setting Sensing Sensitivity: 0.5 mV
Pulse Gen Serial Number: 810089146

## 2023-11-18 ENCOUNTER — Encounter: Payer: Self-pay | Admitting: Psychology

## 2023-11-18 ENCOUNTER — Ambulatory Visit (INDEPENDENT_AMBULATORY_CARE_PROVIDER_SITE_OTHER): Admitting: Psychology

## 2023-11-18 DIAGNOSIS — F32A Depression, unspecified: Secondary | ICD-10-CM | POA: Diagnosis not present

## 2023-11-18 DIAGNOSIS — F419 Anxiety disorder, unspecified: Secondary | ICD-10-CM

## 2023-11-18 NOTE — Progress Notes (Signed)
 [facility] Counselor/Therapist Progress Note  Patient ID: [name] [name] [name], MRN: [id number]    Date: 11/18/23  Time Spent: 9:54  am - 10:46 am : 52 Minutes  Treatment Type: Individual Therapy.  Reported Symptoms: Depression and anxiety.   Mental Status Exam: Appearance:  Casual     Behavior: Appropriate  Motor: Normal  Speech/Language:  Clear and Coherent  Affect: Congruent  Mood: dysthymic  Thought process: normal  Thought content:   WNL  Sensory/Perceptual disturbances:   WNL  Orientation: oriented to person, place, time/date, and situation  Attention: Good  Concentration: Good  Memory: WNL  Fund of knowledge:  Good  Insight:   Good  Judgment:  Good  Impulse Control: Good   Risk Assessment: Danger to Self:  No Self-injurious Behavior: No Danger to Others: No Duty to Warn:no Physical Aggression / Violence:No  Access to Firearms a concern: No  Gang Involvement:No   Subjective:   [name] [name] [name] participated in the session, in person in the office with the therapist, and consented to treatment [name] [name] [name] reviewed the events of the past week.    We reviewed numerous treatment approaches including CBT, BA, Problem Solving, and Solution focused therapy. Psych-education regarding the [name] [name] [name]'s diagnosis of Depression, unspecified depression type  Anxiety was provided during the session. We discussed [name] [name] [name] which include get out of bed earlier, spending more time outside,  improving low motivation, manage symptoms proactively, increase socializing, managing and completing tasks consistently, increasing self-care, building a daily routine. Additional clinical [name] [name] including managing self-talk, increasing mindfulness, managing symptoms proactively, engaging in gratitude. [name] [name] [name] provided verbal approval of the treatment plan.   He noted his goal to get out of bed sooner and that he often spends significant amount of time in  bed throughout the day. We worked on identifying the contributing factors to staying in bed longer.    Interventions: Psycho-education & Goal Setting.   Diagnosis:   Depression, unspecified depression type  Anxiety  Psychiatric Treatment: Yes , Via PCP. See chart.   Treatment Plan:  Client Abilities/Strengths [name] [name] [name] is well spoken and motivated for change.   Support System: Friends  Hospital doctor Preferences OPT  Client Statement of Needs [name] [name] [name] would like to get out of bed earlier, spending more time outside,  improving low motivation, manage symptoms proactively, increase socializing, managing and completing tasks consistently, increasing self-care, building a daily routine. Additional clinical [name] [name] including managing self-talk, increasing mindfulness, managing symptoms proactively, engaging in gratitude.   Treatment Level Weekly  Symptoms  Anxiety: Feeling anxious, difficulty managing worry, worrying about different things, trouble relaxing, restlessness, irritability, feeling afraid something awful might happen.  (Status: maintained) Depression: loss of interest,  feel down,  trouble sleeping, feeling bad about self, difficulty concentrating. (Status: maintained)  [name] [name]:   [name] [name] [name] experiences symptoms of depression and anxiety.  Treatment plan signed and available on s-drive:  No, pending signature via MyChart.  [name] [name] [name] was sent the treatment plan signature form on 11/18/23.   Target Date: 11/17/24 Frequency: Weekly  Progress: 0 Modality: individual    Therapist will provide referrals for additional resources as appropriate.  Therapist will provide psycho-education regarding [name] [name] [name]'s diagnosis and corresponding treatment approaches and interventions. Belva Boyden, LCSW will support the patient's ability to achieve the [name] [name]. will employ CBT, BA, Problem-solving, Solution Focused, Mindfulness,  coping skills, & other evidenced-based  practices will be used to promote progress towards healthy functioning to help manage decrease symptoms associated with his diagnosis.  Reduce overall level, frequency, and intensity of the feelings of depression, anxiety and panic evidenced by decreased overall symptoms from 6 to 7 days/week to 0 to 1 days/week per client report for at least 3 consecutive months. Verbally express understanding of the relationship between feelings of depression, anxiety and their impact on thinking patterns and behaviors. Verbalize an understanding of the role that distorted thinking plays in creating fears, excessive worry, and ruminations.  [name] [name] [name] participated in the creation of the treatment plan)    Belva Boyden, LCSW

## 2023-11-19 ENCOUNTER — Telehealth (HOSPITAL_COMMUNITY): Payer: Self-pay | Admitting: Cardiology

## 2023-11-19 NOTE — Telephone Encounter (Signed)
 Called to confirm/remind patient of [facility] on [facility].   Appointment:   [x] Confirmed  [] Left mess   [] No answer/No voice mail  [] VM Full/unable to leave message  [] Phone not in service  Patient reminded to bring all medications and/or complete list.  Confirmed patient has transportation. Gave directions, instructed to utilize valet parking.

## 2023-11-19 NOTE — Progress Notes (Incomplete)
 ADVANCED HEART FAILURE CLINIC NOTE  Referring Physician: Roslyn Coombe, MD  Primary Care: Roslyn Coombe, MD Primary Cardiologist: Dr. Arlester Ladd  CC: Chronic systolic heart failure  HPI: [name] [name] is a 68 y.o. male with type 2 diabetes, multivessel CAD, hyperlipidemia, hypertension, history of SVT and VT, ischemic cardiomyopathy and systolic heart failure presenting today to establish care. [name] reports his cardiac history dates back to 10-15 years ago when he had a MI; this was followed by two more Mis according to him.   Based on chart review, [name] has multivessel CAD and has followed with Dr. Arlester Ladd for several years now.  He was most recently admitted in March 2024 with a VT; 2.5-minute run of sustained VT.  At that time he was loaded with amiodarone  and started on mexiletine.  He had a left heart cath during that admission that demonstrated multivessel CAD including 99% in-stent stenosis of mid RCA stent with occlusion of the distal RCA, 90% stenosis of the apical LAD; these were unfortunately not amenable to PCI and unchanged [facility].  He was discharged on GDMT and since that time has followed in [facility] clinic where medications were further uptitrated.  Interval hx: From a functional standpoint, Mr. Reth reports doing all ADLs without difficulty or assistance. He can go to the grocery store, however, has to stop due to fatigue and mild SOB after walking 2-3 aisles. He had a BiV ICD placed in early march 2024 he does feel some important in his energy levels and ability to get around.   Activity level/exercise tolerance:  NYHA IIB-III Orthopnea:  Sleeps on 2 pillows Paroxysmal noctural dyspnea:  Infrequent Chest pain/pressure:  No Orthostatic lightheadedness:  NO Palpitations:  NO Lower extremity edema:  No Presyncope/syncope:  No Cough:  No  Current Outpatient Medications  Medication Sig Dispense Refill   acetaminophen  (TYLENOL ) 500 MG tablet Take 1,000  mg by mouth every 6 (six) hours as needed for moderate pain.     albuterol  (VENTOLIN  HFA) 108 (90 Base) MCG/ACT inhaler Inhale 2 puffs into the lungs every 6 (six) hours as needed for wheezing or shortness of breath. 8 g 11   Alcohol Swabs  (B-D SINGLE USE SWABS  BUTTERFLY) PADS Use up to 4 times a day to test blood sugars. Dx: E10.9, E11.9 100 each 11   amiodarone  (PACERONE ) 200 MG tablet TAKE 1 TABLET(200 MG) BY MOUTH DAILY 90 tablet 3   aspirin  EC 81 MG tablet Take 1 tablet (81 mg total) by mouth daily. Swallow whole. 90 tablet 3   atorvastatin  (LIPITOR ) 80 MG tablet Take 1 tablet (80 mg total) by mouth every evening. 90 tablet 3   Blood Glucose Calibration (TRUE METRIX LEVEL 1) Low SOLN Use as needed. Dx: E10.9, E11.9 1 each 0   Blood Glucose Monitoring Suppl (ONETOUCH VERIO FLEX SYSTEM) DEVI 1 Device by Does not apply route in the morning, at noon, in the evening, and at bedtime. E11.9 1 each 0   carvedilol  (COREG ) 6.25 MG tablet TAKE 1 TABLET(6.25 MG) BY MOUTH TWICE DAILY WITH A MEAL 180 tablet 3   clopidogrel  (PLAVIX ) 75 MG tablet TAKE 1 TABLET(75 MG) BY MOUTH DAILY 90 tablet 3   dorzolamide-timolol (COSOPT) 2-0.5 % ophthalmic solution Place 1 drop into both eyes See admin instructions. Instill one drop into both eyes twice in the morning and once at night     ezetimibe  (ZETIA ) 10 MG tablet Take 1 tablet (10 mg total) by  mouth daily. 90 tablet 3   fluticasone  (FLOVENT  HFA) 110 MCG/ACT inhaler Inhale 2 puffs into the lungs 2 (two) times daily. 1 each 12   furosemide  (LASIX ) 40 MG tablet Take 1 tablet (40 mg total) by mouth as needed for fluid or edema. As needed for 3lb weight gain 30 tablet 10   glucose blood (ONETOUCH VERIO) test strip Use as instructed four times per day E11.9 400 each 12   isosorbide -hydrALAZINE  (BIDIL ) 20-37.5 MG tablet TAKE 1 TABLET BY MOUTH THREE TIMES DAILY 270 tablet 2   Lancets MISC Use as directed four times per day E11.9 400 each 3   latanoprost (XALATAN) 0.005 %  ophthalmic solution 1 drop at bedtime.     mexiletine (MEXITIL ) 250 MG capsule TAKE 1 CAPSULE(250 MG) BY MOUTH EVERY 12 HOURS 180 capsule 3   nitroGLYCERIN  (NITROSTAT ) 0.4 MG SL tablet Place 1 tablet (0.4 mg total) under the tongue every 5 (five) minutes as needed for chest pain. N 25 tablet 3   sacubitril -valsartan  (ENTRESTO ) 97-103 MG Take 1 tablet by mouth 2 (two) times daily. 180 tablet 3   sertraline  (ZOLOFT ) 100 MG tablet Take 1 tablet (100 mg total) by mouth daily. 90 tablet 3   spironolactone  (ALDACTONE ) 25 MG tablet Take 1 tablet (25 mg total) by mouth daily. 90 tablet 3   tirzepatide  (MOUNJARO ) 5 MG/0.5ML Pen Inject 5 mg into the skin once a week. 6 mL 3   No current facility-administered medications for this visit.   PHYSICAL EXAM: There were no vitals filed for this visit. GENERAL: NAD Lungs- *** CARDIAC:  JVP: *** cm          Normal rate with regular rhythm. *** murmur.  Pulses ***. *** edema.  ABDOMEN: Soft, non-tender, non-distended.  EXTREMITIES: Warm and well perfused.  NEUROLOGIC: No obvious FND   DATA REVIEW  ECG: 09/17/22:  BiV paced rhythm as per my personal interpretation  ECHO: 08/12/22: LVEF 35%, normal RV function as per my personal interpretation 02/17/23: LVEF 45%-50% with normal RV function. Grade I DD.  01/17/21: LVEF 45-50%, normal RV function as per my interpretation  Cath 08/2022  Multivessel coronary artery disease, including 99% in-stent restenosis of mid RCA stent with occlusion of distal RCA (may be due to competitive flow from left-to-right collaterals).  There is also 90% stenosis of the apical LAD and mild-moderate disease involving the more proximal LAD and LCx. Mildly elevated left ventricular filling pressure (LVEDP 20 mmHg). Tortuous right subclavian/brachiocephalic artery limiting catheter manipulation.  Consider alternate approach for future catheterizations.  Recommendations: Severe RCA disease is unchanged since 2020 based on cath report from  [facility].  Question if VT is related to scar from prior inferior MI. Continue medical therapy and aggressive secondary prevention.  Apical LAD stenosis is too distal for percutaneous intervention. Follow-up EP recommendations regarding management of sustained VT.   Additional Caths 2002 s/p BMS pRCA  2006 - DES to pRCA & DES p/m RCA  2007 -PCI/DES OM4  2008- PCI/CBA to RCA for ISR  2014 -STEMI/Cath/PCI: LAD 95% >> PCI: Promus Prem DES // f. NSTEMI 10/15 >> LHC: mLAD stent ok, dLAD 70, OM1 CTO, OM2 stent ok, dOM2 90, OM3 30-40, dLCx 90, p-mRCA stent ok, mRCA stent ok w/ 60-70 ISR, EF 50% >> med Rx // MV 1/17: no ischemia //  2020- RCA 100 (CTO)    ASSESSMENT & PLAN:  Heart failure with reduced EF Etiology of ZO:XWRUEAVW cardiomyopathy NYHA class / AHA Stage: NYHA IIb but  difficult to assess objectively. Volume status & Diuretics: euvolemic, only taking lasix  40mg  PRN Vasodilators: Entresto  97/103 twice daily Beta-Blocker: Coreg  6.25 mg twice daily, will obtain labs, increase to 12.5 at follow-up MRA: Spironolactone  25 mg daily Cardiometabolic: Start at follow-up Devices therapies & Valvulopathies:CRT-D by Dr. Arlester Ladd in March 2024 Advanced therapies: Not currently indicated  2.  Multivessel CAD -See multiple left heart cath above.  At this point no longer amenable to PCI.  Continue Plavix  and aspirin  lifelong.  3.  Ventricular tachycardia -Status post [employer] BiV on amiodarone  and mexiletine.  Follow-up with Dr. Arlester Ladd  4.  Hypertension -Well-controlled today  5.  CKD 3 A -Repeat labs today   Deovion Batrez Advanced Heart Failure Mechanical Circulatory Support

## 2023-11-20 ENCOUNTER — Encounter (HOSPITAL_COMMUNITY): Admitting: Cardiology

## 2023-11-26 ENCOUNTER — Ambulatory Visit: Admitting: Psychology

## 2023-11-27 ENCOUNTER — Other Ambulatory Visit: Payer: Self-pay | Admitting: *Deleted

## 2023-11-27 ENCOUNTER — Telehealth: Payer: Self-pay | Admitting: *Deleted

## 2023-11-27 NOTE — Patient Outreach (Addendum)
 Complex Care Management   Visit Note  11/27/2023  Name:  [name] [name] MRN: [id number] DOB: 22-Aug-1955  Situation: EMMI Referral received for Complex Care Management related to Mental/Behavioral Health diagnosis depression.  I obtained verbal consent from Patient.  Visit completed with patient  on the phone  Background:   Past Medical History:  Diagnosis Date   CAD (coronary artery disease)    a. s/p BMS pRCA 2002; b. DES to pRCA & DES p/m RCA 2006; c. PCI/DES OM4 2007; d. PCI/CBA to RCA for ISR 10/2006; e.08/2012 STEMI/Cath/PCI:  LAD 95% >> PCI: Promus Prem DES  //  f. NSTEMI 10/15 >> LHC: mLAD stent ok, dLAD 70, OM1 CTO, OM2 stent ok, dOM2 90, OM3 30-40, dLCx 90, p-mRCA stent ok, mRCA stent ok w/ 60-70 ISR, EF 50% >> med Rx // MV 1/17: no ischemia // Cath ([facility]) 05/2019: RCA 100 (CTO)    Combined systolic and diastolic CHF    a. Myoview  1/17: EF 35%  //  b. Echo 1/17 EF 45-50% // Echo 05/2019: EF 50-55 // Echocardiogram 8/21: EF 50, inf AK, post and mid inf HK, mod LVH, RVSP 25.3, mild to mod LAE, trivial MR    Depression    Diabetes mellitus (HCC)    a. A1c 8.8 08/2012->Metformin  initiated. => b. A1c (9/14): 6.6   Gastroesophageal reflux disease    History of nuclear stress test    a. Myoview  1/17: EF 35%, fixed inferior lateral defect consistent with infarct, no ischemia, intermediate risk   History of pneumonia    History of stroke 01/2021   Zio Monitor 11/22: NSR, avg HR 61; PVCs (1.9%); no AFib, no high grade HB, no ventricular arrhythmias   HTN (hypertension)    Hx of NSTEMI    Hyperlipidemia    Ischemic cardiomyopathy    a. EF 40%; improved to normal;  b. 08/2012 Echo: EF 50-55%, mod LVH.//  c. Echo 9/16: Inf HK, mild LVH, EF 55%, mild LAE, normal RVF, mild RAE, PASP 35 mmHg  //  d. Echo 1/17: EF 45-50%, inferior HK, mild BAE, PASP 33 mmHg   Obesity    OSA (obstructive sleep apnea)    Does not use CPAP as of 05/2011   Tobacco abuse     Assessment: Patient Reported  Symptoms:  Cognitive Cognitive Status: Alert and oriented to person, place, and time, Insightful and able to interpret abstract concepts, Normal speech and language skills   Health Maintenance Behaviors: Annual physical exam Health Facilitated by: Prayer/meditation  Neurological Neurological Review of Symptoms: No symptoms reported    HEENT HEENT Symptoms Reported: Change or loss of hearing, Tinnitus (not urgent but can tell he is experiencing some hearing loss)      Cardiovascular Cardiovascular Symptoms Reported: No symptoms reported Does patient have uncontrolled Hypertension?: No Cardiovascular Conditions: Coronary artery disease, Heart failure Cardiovascular Management Strategies: Coping strategies, Adequate rest, Medical device (ICD devifibrillator) Cardiovascular Self-Management Outcome: 4 (good) Cardiovascular Comment: follwed by [facility] Scott [name]-next appt with heart and vascular 12/17/23  Respiratory Respiratory Symptoms Reported: No symptoms reported    Endocrine Patient reports the following symptoms related to hypoglycemia or hyperglycemia : No symptoms reported Is patient diabetic?: Yes Is patient checking blood sugars at home?: Yes Endocrine Conditions: Diabetes Endocrine Management Strategies: Diet modification, Medication therapy  Gastrointestinal Gastrointestinal Symptoms Reported: Diarrhea, Abdominal pain or discomfort Additional Gastrointestinal Details: Per patient, may be due to mounjero Gastrointestinal Conditions: Diarrhea Gastrointestinal Management Strategies: Medication therapy Gastrointestinal Comment: Appointment with GI for  CT 12/03/23 Nutrition Risk Screen (CP): No indicators present  Genitourinary Genitourinary Symptoms Reported: No symptoms reported    Integumentary Integumentary Symptoms Reported: No symptoms reported    Musculoskeletal Musculoskelatal Symptoms Reviewed: Unsteady gait Musculoskeletal Conditions: Unsteady  gait Musculoskeletal Management Strategies: Exercise Musculoskeletal Comment: uses a cain when needed Falls in the past year?: No Number of falls in past year: 1 or less Was there an injury with Fall?: No Fall Risk Category Calculator: 0 Patient Fall Risk Level: Low Fall Risk Patient at Risk for Falls Due to: Impaired mobility Fall risk Follow up: Falls prevention discussed  Psychosocial Psychosocial Symptoms Reported: Depression - if selected complete PHQ 2-9, Anxiety - if selected complete GAD Additional Psychological Details: Mood has improved with Medication-reports that he has become more active, not overthinking/over analyzing Behavioral Health Conditions: Anxiety, Depression Behavioral Management Strategies: Adequate rest, Coping strategies, Medication therapy Major Change/Loss/Stressor/Fears (CP): Denies Behaviors When Feeling Stressed/Fearful: I go get help Currently active with [facility],-next appt 12/10/23 , activity/ excercise Techniques to Cope with Loss/Stress/Change: Diversional activities, Medication Quality of Family Relationships: involved, supportive Do you feel physically threatened by others?: No      11/27/2023    3:29 PM  Depression screen PHQ 2/9  Decreased Interest 0  Down, Depressed, Hopeless 0  PHQ - 2 Score 0      11/27/2023    3:35 PM 07/27/2022    8:16 AM  GAD 7 : Generalized Anxiety Score  Nervous, Anxious, on Edge 0 0  Control/stop worrying 0 0  Worry too much - different things 0 0  Trouble relaxing 0 0  Restless 0 0  Easily annoyed or irritable 0 0  Afraid - awful might happen 0 0  Total GAD 7 Score 0 0  Anxiety Difficulty Not difficult at all Not difficult at all      There were no vitals filed for this visit.  Medications Reviewed Today     Reviewed by Ave Leisure, LCSW (Social Worker) on 11/27/23 at 1603  Med List Status: <None>   Medication Order Taking? Sig Documenting Provider Last Dose Status Informant   acetaminophen  (TYLENOL ) 500 MG tablet [id number] Yes Take 1,000 mg by mouth every 6 (six) hours as needed for moderate pain. [provider]  Active Self, Pharmacy Records  albuterol  (VENTOLIN  HFA) 108 [phone] Base) MCG/ACT inhaler [id number] Yes Inhale 2 puffs into the lungs every 6 (six) hours as needed for wheezing or shortness of breath. Roslyn Coombe, MD  Active   Alcohol Swabs  (B-D SINGLE USE SWABS  BUTTERFLY) PADS [id number] Yes Use up to 4 times a day to test blood sugars. Dx: E10.9, E11.9 Roslyn Coombe, MD  Active Self, Pharmacy Records  amiodarone  (PACERONE ) 200 MG tablet [id number] Yes TAKE 1 TABLET(200 MG) BY MOUTH DAILY Nahser, Lela Purple, MD  Active   aspirin  EC 81 MG tablet [id number] Yes Take 1 tablet (81 mg total) by mouth daily. Swallow whole. Swinyer, Leilani Punter, NP  Active Self, Pharmacy Records  atorvastatin  (LIPITOR ) 80 MG tablet [id number] Yes Take 1 tablet (80 mg total) by mouth every evening. Roslyn Coombe, MD  Active   Blood Glucose Calibration (TRUE METRIX LEVEL 1) Low SOLN [id number] Yes Use as needed. Dx: E10.9, E11.9 Roslyn Coombe, MD  Active Self, Pharmacy Records  Blood Glucose Monitoring Suppl [facility] VERIO FLEX SYSTEM) DEVI [id number] Yes 1 Device by Does not apply route in the morning, at noon, in the evening, and at bedtime. E11.9 Autry Legions,  Alveda Aures, MD  Active Self, Pharmacy Records  carvedilol  (COREG ) 6.25 MG tablet [id number] Yes TAKE 1 TABLET(6.25 MG) BY MOUTH TWICE DAILY WITH A MEAL Nahser, Lela Purple, MD  Active   clopidogrel  (PLAVIX ) 75 MG tablet [id number] Yes TAKE 1 TABLET(75 MG) BY MOUTH DAILY Swinyer, Leilani Punter, NP  Active   dorzolamide-timolol (COSOPT) 2-0.5 % ophthalmic solution [id number] Yes Place 1 drop into both eyes See admin instructions. Instill one drop into both eyes twice in the morning and once at night [provider]  Active Self, Pharmacy Records  ezetimibe  (ZETIA ) 10 MG tablet [id number] Yes Take 1 tablet (10 mg total) by mouth daily. Roslyn Coombe, MD  Active   fluticasone  (FLOVENT  HFA) 110 MCG/ACT inhaler [id number] Yes Inhale 2 puffs into the lungs 2 (two) times daily. Roslyn Coombe, MD  Active   furosemide  (LASIX ) 40 MG tablet [id number] Yes Take 1 tablet (40 mg total) by mouth as needed for fluid or edema. As needed for 3lb weight gain Arnoldo Lapping, MD  Active   glucose blood [facility] VERIO) test strip [id number] Yes Use as instructed four times per day E11.9 Roslyn Coombe, MD  Active Self, Pharmacy Records  isosorbide -hydrALAZINE  (BIDIL ) 20-37.5 MG tablet [id number] Yes TAKE 1 TABLET BY MOUTH THREE TIMES DAILY Swinyer, Leilani Punter, NP  Active   Lancets MISC [id number] Yes Use as directed four times per day E11.9 Roslyn Coombe, MD  Active Self, Pharmacy Records  latanoprost (XALATAN) 0.005 % ophthalmic solution [id number] Yes 1 drop at bedtime. [provider]  Active   mexiletine (MEXITIL ) 250 MG capsule [id number] Yes TAKE 1 CAPSULE(250 MG) BY MOUTH EVERY 12 HOURS Ollis, Brandi L, NP  Active   nitroGLYCERIN  (NITROSTAT ) 0.4 MG SL tablet [id number] Yes Place 1 tablet (0.4 mg total) under the tongue every 5 (five) minutes as needed for chest pain. N Swinyer, Leilani Punter, NP  Active Self, Pharmacy Records           Med Note Alfredo Inch   Mon Oct 01, 2022 10:32 AM)    sacubitril -valsartan  (ENTRESTO ) 97-103 MG [id number] Yes Take 1 tablet by mouth 2 (two) times daily. Thomasena Fleming, NP  Active   sertraline  (ZOLOFT ) 100 MG tablet [id number] Yes Take 1 tablet (100 mg total) by mouth daily. Roslyn Coombe, MD  Active   spironolactone  (ALDACTONE ) 25 MG tablet [id number] Yes Take 1 tablet (25 mg total) by mouth daily. Swinyer, Leilani Punter, NP  Active Self, Pharmacy Records  tirzepatide  (MOUNJARO ) 5 MG/0.5ML Pen [id number] Yes Inject 5 mg into the skin once a week. Roslyn Coombe, MD  Active   Med List Note Claretha Crocker, [place] 12/15/15 1042): CPAP            Recommendation:   PCP Follow-up Specialty provider follow-up 12/17/23  CHF,  Mental Health 7/1, GI CT 12/03/23, 8/1/ foot doctor  Follow Up Plan:   Patient assessed, patient active with [facility]. Patient declined have any additional mental health or community resource needs at this time.    Bray Vickerman, LCSW [place]  [facility], [facility] Licensed Clinical Social Worker  Direct Dial: [phone]

## 2023-11-27 NOTE — Patient Instructions (Signed)
 Visit Information  Thank you for taking time to visit with me today. Please don't hesitate to contact me if I can be of assistance to you before our next scheduled appointment.  Our next appointment is no further scheduled appointments.     Please call the Suicide and Crisis Lifeline: 988 if you are experiencing a Mental Health or Behavioral Health Crisis or need someone to talk to.  Please call if you have any any additional mental health/community resource needs that may arise in the future  Patient verbalizes understanding of instructions and care plan provided today and agrees to view in MyChart. Active MyChart status and patient understanding of how to access instructions and care plan via MyChart confirmed with patient.     Krishang Reading, LCSW [employer]  [facility], [facility] Licensed Clinical Social Worker  Direct Dial: [phone]

## 2023-11-28 ENCOUNTER — Ambulatory Visit: Admitting: Physician Assistant

## 2023-12-03 ENCOUNTER — Inpatient Hospital Stay: Admission: RE | Admit: 2023-12-03 | Source: Ambulatory Visit

## 2023-12-09 ENCOUNTER — Ambulatory Visit: Admitting: Psychology

## 2023-12-10 ENCOUNTER — Ambulatory Visit (INDEPENDENT_AMBULATORY_CARE_PROVIDER_SITE_OTHER): Admitting: Psychology

## 2023-12-10 DIAGNOSIS — F32A Depression, unspecified: Secondary | ICD-10-CM

## 2023-12-10 DIAGNOSIS — F419 Anxiety disorder, unspecified: Secondary | ICD-10-CM | POA: Diagnosis not present

## 2023-12-10 NOTE — Progress Notes (Signed)
 [facility] Counselor/Therapist Progress Note  Patient ID: [name] [name], MRN: [id number]    Date: 12/10/23  Time Spent: 9:03 am - 9:57 am : 54 Minutes  Treatment Type: Individual Therapy.  Reported Symptoms: Depression and anxiety.   Mental Status Exam: Appearance:  Casual     Behavior: Appropriate  Motor: Normal  Speech/Language:  Clear and Coherent  Affect: Congruent  Mood: dysthymic  Thought process: normal  Thought content:   WNL  Sensory/Perceptual disturbances:   WNL  Orientation: oriented to person, place, time/date, and situation  Attention: Good  Concentration: Good  Memory: WNL  Fund of knowledge:  Good  Insight:   Good  Judgment:  Good  Impulse Control: Good   Risk Assessment: Danger to Self:  No Self-injurious Behavior: No Danger to Others: No Duty to Warn:no Physical Aggression / Violence:No  Access to Firearms a concern: No  Gang Involvement:No   Subjective:   [name] [name] participated in the session, in person in the office with the therapist, and consented to treatment [name] [name] reviewed the events of the past week. [name] [name] noted missing the previous appointment on 6/17 due to his blood sugar. He noted that he recently hasn't been addressing health dietarily. He noted that he does not enjoy cooking and that he does not do much prep for meals. He noted often getting hungry quickly and eating fast food. He noted that he doesn't have the mindset to eat healthy. He noted that he doesn't have the drive to do anything.  We worked on exploring this during the session. We worked on processing the barriers to healthful eating including preparation, cooking, shopping, time-commitment, lack of structure / routine & motivation. He noted contributing factors to eating unhealthfully includes accessibility of fast-food, cash available, lack of plan for cooking, lack of availability of food at home, lack of structure of day-to-day. He noted that his  lack of energy and motivation is a significant barrier to engagement in various ways. Therapist provided psycho-education regarding BA and applied this to his difficulty with eating healthfully and exercise. Therapist reviewed the importance of scheduling, preparation, problem-solving, use of reminders, use of reinforcers, and managing self-talk related to tasks. Additionally, therapist reviewed the use of  Therapist provided examples of this during the session. [name] [name] committed to working shaving his beard and head in preparation for a larger goal, attending church. Therapist praised [name] [name] for this during the session. Therapist provided psycho-education regarding depression and anxiety. [name] [name] was engaged and motivated during the session. He expressed commitment towards goals. A follow-up was scheduled for continued treatment, which he benefits from.   Interventions: CBT and BA  Diagnosis:   Depression, unspecified depression type  Anxiety  Psychiatric Treatment: Yes , Via PCP. See chart.   Treatment Plan:  Client Abilities/Strengths [name] [name] is well spoken and motivated for change.   Support System: Friends  Hospital doctor Preferences OPT  Client Statement of Needs [name] [name] would like to get out of bed earlier, spending more time outside,  improving low motivation, manage symptoms proactively, increase socializing, managing and completing tasks consistently, increasing self-care, building a daily routine. Additional clinical goals including managing self-talk, increasing mindfulness, managing symptoms proactively, engaging in gratitude.   Treatment Level Weekly  Symptoms  Anxiety: Feeling anxious, difficulty managing worry, worrying about different things, trouble relaxing, restlessness, irritability, feeling afraid something awful might happen.  (Status: maintained) Depression: loss of interest,  feel down,  trouble sleeping, feeling bad about self, difficulty  concentrating. (Status: maintained)  Goals:  [name] [name] experiences symptoms of depression and anxiety.  Treatment plan signed and available on s-drive:  No, pending signature via MyChart.  [name] [name] was sent the treatment plan signature form on 11/18/23.   Target Date: 11/17/24 Frequency: Weekly  Progress: 0 Modality: individual    Therapist will provide referrals for additional resources as appropriate.  Therapist will provide psycho-education regarding [name] [name]'s diagnosis and corresponding treatment approaches and interventions. [name] Mullet, LCSW will support the patient's ability to achieve the goals identified. will employ CBT, BA, Problem-solving, Solution Focused, Mindfulness,  coping skills, & other evidenced-based practices will be used to promote progress towards healthy functioning to help manage decrease symptoms associated with his diagnosis.   Reduce overall level, frequency, and intensity of the feelings of depression, anxiety and panic evidenced by decreased overall symptoms from 6 to 7 days/week to 0 to 1 days/week per client report for at least 3 consecutive months. Verbally express understanding of the relationship between feelings of depression, anxiety and their impact on thinking patterns and behaviors. Verbalize an understanding of the role that distorted thinking plays in creating fears, excessive worry, and ruminations.  [name] [name] participated in the creation of the treatment plan)    [name] Mullet, LCSW

## 2023-12-16 NOTE — Progress Notes (Incomplete)
 ADVANCED HEART FAILURE CLINIC NOTE  Referring Physician: Norleen Lynwood ORN, MD  Primary Care: Norleen Lynwood ORN, MD Primary Cardiologist: Dr. Wonda  CC: Chronic systolic heart failure  HPI: [name] [name] is a 68 y.o. male with type 2 diabetes, multivessel CAD, hyperlipidemia, hypertension, history of SVT and VT, ischemic cardiomyopathy and systolic heart failure presenting today to establish care. [name] [name] reports his cardiac history dates back to 10-15 years ago when he had a MI; this was followed by two more Mis according to him.   Based on chart review, [name] [name] has multivessel CAD and has followed with Dr. Wonda for several years now.  He was most recently admitted in March 2024 with a VT; 2.5-minute run of sustained VT.  At that time he was loaded with amiodarone  and started on mexiletine.  He had a left heart cath during that admission that demonstrated multivessel CAD including 99% in-stent stenosis of mid RCA stent with occlusion of the distal RCA, 90% stenosis of the apical LAD; these were unfortunately not amenable to PCI and unchanged [facility].  He was discharged on GDMT and since that time has followed in [facility] clinic where medications were further uptitrated.  Interval hx: ***  Current Outpatient Medications  Medication Sig Dispense Refill   acetaminophen  (TYLENOL ) 500 MG tablet Take 1,000 mg by mouth every 6 (six) hours as needed for moderate pain.     albuterol  (VENTOLIN  HFA) 108 (90 Base) MCG/ACT inhaler Inhale 2 puffs into the lungs every 6 (six) hours as needed for wheezing or shortness of breath. 8 g 11   Alcohol Swabs  (B-D SINGLE USE SWABS  BUTTERFLY) PADS Use up to 4 times a day to test blood sugars. Dx: E10.9, E11.9 100 each 11   amiodarone  (PACERONE ) 200 MG tablet TAKE 1 TABLET(200 MG) BY MOUTH DAILY 90 tablet 3   aspirin  EC 81 MG tablet Take 1 tablet (81 mg total) by mouth daily. Swallow whole. 90 tablet 3   atorvastatin  (LIPITOR ) 80 MG tablet Take 1  tablet (80 mg total) by mouth every evening. 90 tablet 3   Blood Glucose Calibration (TRUE METRIX LEVEL 1) Low SOLN Use as needed. Dx: E10.9, E11.9 1 each 0   Blood Glucose Monitoring Suppl (ONETOUCH VERIO FLEX SYSTEM) DEVI 1 Device by Does not apply route in the morning, at noon, in the evening, and at bedtime. E11.9 1 each 0   carvedilol  (COREG ) 6.25 MG tablet TAKE 1 TABLET(6.25 MG) BY MOUTH TWICE DAILY WITH A MEAL 180 tablet 3   clopidogrel  (PLAVIX ) 75 MG tablet TAKE 1 TABLET(75 MG) BY MOUTH DAILY 90 tablet 3   dorzolamide-timolol (COSOPT) 2-0.5 % ophthalmic solution Place 1 drop into both eyes See admin instructions. Instill one drop into both eyes twice in the morning and once at night     ezetimibe  (ZETIA ) 10 MG tablet Take 1 tablet (10 mg total) by mouth daily. 90 tablet 3   fluticasone  (FLOVENT  HFA) 110 MCG/ACT inhaler Inhale 2 puffs into the lungs 2 (two) times daily. 1 each 12   furosemide  (LASIX ) 40 MG tablet Take 1 tablet (40 mg total) by mouth as needed for fluid or edema. As needed for 3lb weight gain 30 tablet 10   glucose blood (ONETOUCH VERIO) test strip Use as instructed four times per day E11.9 400 each 12   isosorbide -hydrALAZINE  (BIDIL ) 20-37.5 MG tablet TAKE 1 TABLET BY MOUTH THREE TIMES DAILY 270 tablet 2   Lancets MISC Use as  directed four times per day E11.9 400 each 3   latanoprost (XALATAN) 0.005 % ophthalmic solution 1 drop at bedtime.     mexiletine (MEXITIL ) 250 MG capsule TAKE 1 CAPSULE(250 MG) BY MOUTH EVERY 12 HOURS 180 capsule 3   nitroGLYCERIN  (NITROSTAT ) 0.4 MG SL tablet Place 1 tablet (0.4 mg total) under the tongue every 5 (five) minutes as needed for chest pain. N 25 tablet 3   sacubitril -valsartan  (ENTRESTO ) 97-103 MG Take 1 tablet by mouth 2 (two) times daily. 180 tablet 3   sertraline  (ZOLOFT ) 100 MG tablet Take 1 tablet (100 mg total) by mouth daily. 90 tablet 3   spironolactone  (ALDACTONE ) 25 MG tablet Take 1 tablet (25 mg total) by mouth daily. 90 tablet  3   tirzepatide  (MOUNJARO ) 5 MG/0.5ML Pen Inject 5 mg into the skin once a week. 6 mL 3   No current facility-administered medications for this visit.   PHYSICAL EXAM: There were no vitals filed for this visit. GENERAL: NAD Lungs- *** CARDIAC:  JVP: *** cm          Normal rate with regular rhythm. *** murmur.  Pulses ***. *** edema.  ABDOMEN: Soft, non-tender, non-distended.  EXTREMITIES: Warm and well perfused.  NEUROLOGIC: No obvious FND   DATA REVIEW  ECG: 09/17/22:  BiV paced rhythm as per my personal interpretation  ECHO: 08/12/22: LVEF 35%, normal RV function as per my personal interpretation 02/17/23: LVEF 45%-50% with normal RV function. Grade I DD.  01/17/21: LVEF 45-50%, normal RV function as per my interpretation  Cath 08/2022  Multivessel coronary artery disease, including 99% in-stent restenosis of mid RCA stent with occlusion of distal RCA (may be due to competitive flow from left-to-right collaterals).  There is also 90% stenosis of the apical LAD and mild-moderate disease involving the more proximal LAD and LCx. Mildly elevated left ventricular filling pressure (LVEDP 20 mmHg). Tortuous right subclavian/brachiocephalic artery limiting catheter manipulation.  Consider alternate approach for future catheterizations.  Recommendations: Severe RCA disease is unchanged since 2020 based on cath report from [facility].  Question if VT is related to scar from prior inferior MI. Continue medical therapy and aggressive secondary prevention.  Apical LAD stenosis is too distal for percutaneous intervention. Follow-up EP recommendations regarding management of sustained VT.   Additional Caths 2002 s/p BMS pRCA  2006 - DES to pRCA & DES p/m RCA  2007 -PCI/DES OM4  2008- PCI/CBA to RCA for ISR  2014 -STEMI/Cath/PCI: LAD 95% >> PCI: Promus Prem DES // f. NSTEMI 10/15 >> LHC: mLAD stent ok, dLAD 70, OM1 CTO, OM2 stent ok, dOM2 90, OM3 30-40, dLCx 90, p-mRCA stent ok, mRCA stent ok w/ 60-70  ISR, EF 50% >> med Rx // MV 1/17: no ischemia //  2020- RCA 100 (CTO)    ASSESSMENT & PLAN:  Heart failure with reduced EF Etiology of YQ:pdryzfpr cardiomyopathy NYHA class / AHA Stage: NYHA IIb but difficult to assess objectively. Volume status & Diuretics: euvolemic, only taking lasix  40mg  PRN Vasodilators: Entresto  97/103 twice daily Beta-Blocker: Coreg  6.25 mg twice daily, will obtain labs, increase to 12.5 at follow-up MRA: Spironolactone  25 mg daily Cardiometabolic: Start at follow-up Devices therapies & Valvulopathies:CRT-D by Dr. Nancey in March 2024 Advanced therapies: Not currently indicated  2.  Multivessel CAD -See multiple left heart cath above.  At this point no longer amenable to PCI.  Continue Plavix  and aspirin  lifelong.  3.  Ventricular tachycardia -Status post [employer] BiV on amiodarone  and mexiletine.  Follow-up  with Dr. Nancey  4.  Hypertension -Well-controlled today  5.  CKD 3 A -Repeat labs today   Yechezkel Fertig Advanced Heart Failure Mechanical Circulatory Support

## 2023-12-17 ENCOUNTER — Encounter (HOSPITAL_COMMUNITY): Admitting: Cardiology

## 2023-12-19 ENCOUNTER — Other Ambulatory Visit: Payer: Self-pay | Admitting: Cardiovascular Disease

## 2023-12-19 ENCOUNTER — Other Ambulatory Visit: Payer: Self-pay | Admitting: Nurse Practitioner

## 2023-12-19 MED ORDER — CARVEDILOL 6.25 MG PO TABS: 6.2500 mg | ORAL_TABLET | Freq: Two times a day (BID) | ORAL | 0 refills | Status: AC

## 2023-12-19 MED ORDER — AMIODARONE HCL 200 MG PO TABS: 200.0000 mg | ORAL_TABLET | Freq: Every day | ORAL | 0 refills | Status: DC

## 2023-12-19 MED ORDER — CLOPIDOGREL BISULFATE 75 MG PO TABS: 75.0000 mg | ORAL_TABLET | Freq: Every day | ORAL | 0 refills | Status: AC

## 2023-12-30 ENCOUNTER — Other Ambulatory Visit

## 2024-01-01 NOTE — Addendum Note (Signed)
 Addended by: VICCI SELLER A on: 01/01/2024 09:51 AM   Modules accepted: Orders

## 2024-01-01 NOTE — Progress Notes (Signed)
 Remote ICD transmission.

## 2024-01-07 ENCOUNTER — Ambulatory Visit: Admitting: Psychology

## 2024-01-10 ENCOUNTER — Ambulatory Visit: Admitting: Podiatry

## 2024-01-17 ENCOUNTER — Ambulatory Visit: Admitting: Podiatry

## 2024-01-18 ENCOUNTER — Other Ambulatory Visit: Payer: Self-pay | Admitting: Cardiovascular Disease

## 2024-01-20 ENCOUNTER — Ambulatory Visit (INDEPENDENT_AMBULATORY_CARE_PROVIDER_SITE_OTHER): Admitting: Podiatry

## 2024-01-20 ENCOUNTER — Ambulatory Visit: Admitting: Physician Assistant

## 2024-01-20 ENCOUNTER — Encounter: Payer: Self-pay | Admitting: Podiatry

## 2024-01-20 DIAGNOSIS — M79674 Pain in right toe(s): Secondary | ICD-10-CM | POA: Diagnosis not present

## 2024-01-20 DIAGNOSIS — M79675 Pain in left toe(s): Secondary | ICD-10-CM

## 2024-01-20 DIAGNOSIS — B351 Tinea unguium: Secondary | ICD-10-CM | POA: Diagnosis not present

## 2024-01-20 DIAGNOSIS — L84 Corns and callosities: Secondary | ICD-10-CM

## 2024-01-20 DIAGNOSIS — E1159 Type 2 diabetes mellitus with other circulatory complications: Secondary | ICD-10-CM

## 2024-01-20 NOTE — Progress Notes (Deleted)
 Ellouise Console, PA-C [address] [place], [place]  [address] Phone: [phone]   Gastroenterology Consultation  Referring Provider:     Norleen Lynwood ORN, MD Primary Care Physician:  Norleen Lynwood ORN, MD Primary Gastroenterologist:  Ellouise Console, PA-C / *** Reason for Consultation:     Discuss colonoscopy        HPI:   [name] [name] is a 68 y.o. y/o male referred for consultation & management  by Norleen Lynwood ORN, MD. here to discuss scheduling for screening colonoscopy.  PMH: CAD, NSTEMI, ischemic cardiomyopathy, CHF, PSVT, sleep apnea, asthma, type 2 diabetes, GERD, history of colon polyps, peripheral neuropathy, chronic kidney disease, history of stroke and TIA, morbid obesity, hypertension, tobacco dependence.  Currently on aspirin  and Plavix .  Also taking Mounjaro .  08/2022 ECHO: - LVEF 30-35%.  03/2012: Last Colonoscopy by Dr. Burnette at [facility]: Multiple hyperplastic sigmoid colon polyps.  Fair prep (with 2 day prep).  Abnormal mucosa in Cecum.  Biopsies were done.  5 year repeat.  Past Medical History:  Diagnosis Date   CAD (coronary artery disease)    a. s/p BMS pRCA 2002; b. DES to pRCA & DES p/m RCA 2006; c. PCI/DES OM4 2007; d. PCI/CBA to RCA for ISR 10/2006; e.08/2012 STEMI/Cath/PCI:  LAD 95% >> PCI: Promus Prem DES  //  f. NSTEMI 10/15 >> LHC: mLAD stent ok, dLAD 70, OM1 CTO, OM2 stent ok, dOM2 90, OM3 30-40, dLCx 90, p-mRCA stent ok, mRCA stent ok w/ 60-70 ISR, EF 50% >> med Rx // MV 1/17: no ischemia // Cath ([facility]) 05/2019: RCA 100 (CTO)    Combined systolic and diastolic CHF    a. Myoview  1/17: EF 35%  //  b. Echo 1/17 EF 45-50% // Echo 05/2019: EF 50-55 // Echocardiogram 8/21: EF 50, inf AK, post and mid inf HK, mod LVH, RVSP 25.3, mild to mod LAE, trivial MR    Depression    Diabetes mellitus (HCC)    a. A1c 8.8 08/2012->Metformin  initiated. => b. A1c (9/14): 6.6   Gastroesophageal reflux disease    History of nuclear stress test    a. Myoview  1/17: EF 35%, fixed  inferior lateral defect consistent with infarct, no ischemia, intermediate risk   History of pneumonia    History of stroke 01/2021   Zio Monitor 11/22: NSR, avg HR 61; PVCs (1.9%); no AFib, no high grade HB, no ventricular arrhythmias   HTN (hypertension)    Hx of NSTEMI    Hyperlipidemia    Ischemic cardiomyopathy    a. EF 40%; improved to normal;  b. 08/2012 Echo: EF 50-55%, mod LVH.//  c. Echo 9/16: Inf HK, mild LVH, EF 55%, mild LAE, normal RVF, mild RAE, PASP 35 mmHg  //  d. Echo 1/17: EF 45-50%, inferior HK, mild BAE, PASP 33 mmHg   Obesity    OSA (obstructive sleep apnea)    Does not use CPAP as of 05/2011   Tobacco abuse     Past Surgical History:  Procedure Laterality Date   BIV ICD INSERTION CRT-D N/A 08/14/2022   Procedure: BIV ICD INSERTION CRT-D;  Surgeon: Nancey Eulas BRAVO, MD;  Location: [facility] INVASIVE CV LAB;  Service: Cardiovascular;  Laterality: N/A;   CORONARY ANGIOPLASTY WITH STENT PLACEMENT     CORONARY STENT PLACEMENT  2009   LEFT HEART CATH AND CORONARY ANGIOGRAPHY N/A 08/13/2022   Procedure: LEFT HEART CATH AND CORONARY ANGIOGRAPHY;  Surgeon: Mady Bruckner, MD;  Location: [facility] INVASIVE  CV LAB;  Service: Cardiovascular;  Laterality: N/A;   LEFT HEART CATHETERIZATION WITH CORONARY ANGIOGRAM N/A 08/31/2012   Procedure: LEFT HEART CATHETERIZATION WITH CORONARY ANGIOGRAM;  Surgeon: Lonni JONETTA Cash, MD;  Location: [facility] CATH LAB;  Service: Cardiovascular;  Laterality: N/A;   LEFT HEART CATHETERIZATION WITH CORONARY ANGIOGRAM N/A 11/16/2013   Procedure: LEFT HEART CATHETERIZATION WITH CORONARY ANGIOGRAM;  Surgeon: Ozell JONETTA Fell, MD;  Location: [facility] CATH LAB;  Service: Cardiovascular;  Laterality: N/A;   LEFT HEART CATHETERIZATION WITH CORONARY ANGIOGRAM N/A 03/15/2014   Procedure: LEFT HEART CATHETERIZATION WITH CORONARY ANGIOGRAM;  Surgeon: Peter M Swaziland, MD;  Location: [facility] CATH LAB;  Service: Cardiovascular;  Laterality: N/A;   PERCUTANEOUS CORONARY STENT INTERVENTION  (PCI-S) Right 08/31/2012   Procedure: PERCUTANEOUS CORONARY STENT INTERVENTION (PCI-S);  Surgeon: Lonni JONETTA Cash, MD;  Location: [facility] CATH LAB;  Service: Cardiovascular;  Laterality: Right;   PERCUTANEOUS CORONARY STENT INTERVENTION (PCI-S)  11/16/2013   Procedure: PERCUTANEOUS CORONARY STENT INTERVENTION (PCI-S);  Surgeon: Ozell JONETTA Fell, MD;  Location: [facility] CATH LAB;  Service: Cardiovascular;;    Prior to Admission medications   Medication Sig Start Date End Date Taking? Authorizing Provider  acetaminophen  (TYLENOL ) 500 MG tablet Take 1,000 mg by mouth every 6 (six) hours as needed for moderate pain.    [provider]  albuterol  (VENTOLIN  HFA) 108 (90 Base) MCG/ACT inhaler Inhale 2 puffs into the lungs every 6 (six) hours as needed for wheezing or shortness of breath. 08/06/23   Norleen Lynwood ORN, MD  Alcohol Swabs  (B-D SINGLE USE SWABS  BUTTERFLY) PADS Use up to 4 times a day to test blood sugars. Dx: E10.9, E11.9 09/30/20   Norleen Lynwood ORN, MD  amiodarone  (PACERONE ) 200 MG tablet Take 1 tablet (200 mg total) by mouth daily. 12/19/23   Fell Ozell, MD  aspirin  EC 81 MG tablet Take 1 tablet (81 mg total) by mouth daily. Swallow whole. 08/27/22   Swinyer, Rosaline HERO, NP  atorvastatin  (LIPITOR ) 80 MG tablet Take 1 tablet (80 mg total) by mouth every evening. 08/06/23   Norleen Lynwood ORN, MD  Blood Glucose Calibration (TRUE METRIX LEVEL 1) Low SOLN Use as needed. Dx: E10.9, E11.9 07/27/22   Norleen Lynwood ORN, MD  Blood Glucose Monitoring Suppl (ONETOUCH VERIO FLEX SYSTEM) DEVI 1 Device by Does not apply route in the morning, at noon, in the evening, and at bedtime. E11.9 08/10/22   Norleen Lynwood ORN, MD  carvedilol  (COREG ) 6.25 MG tablet Take 1 tablet (6.25 mg total) by mouth 2 (two) times daily with a meal. 12/19/23   Fell Ozell, MD  clopidogrel  (PLAVIX ) 75 MG tablet Take 1 tablet (75 mg total) by mouth daily. 12/19/23   Cooper, Michael, MD  dorzolamide-timolol (COSOPT) 2-0.5 % ophthalmic solution Place 1  drop into both eyes See admin instructions. Instill one drop into both eyes twice in the morning and once at night 03/12/22   [provider]  ezetimibe  (ZETIA ) 10 MG tablet Take 1 tablet (10 mg total) by mouth daily. 08/06/23   Norleen Lynwood ORN, MD  fluticasone  (FLOVENT  HFA) 110 MCG/ACT inhaler Inhale 2 puffs into the lungs 2 (two) times daily. 11/21/22   Norleen Lynwood ORN, MD  furosemide  (LASIX ) 40 MG tablet Take 1 tablet (40 mg total) by mouth as needed for fluid or edema. As needed for 3lb weight gain 11/08/22   Fell Ozell, MD  glucose blood [facility] VERIO) test strip Use as instructed four times per day E11.9 08/10/22   Norleen Lynwood  W, MD  isosorbide -hydrALAZINE  (BIDIL ) 20-37.5 MG tablet TAKE 1 TABLET BY MOUTH THREE TIMES DAILY 01/15/23   Swinyer, Rosaline HERO, NP  Lancets MISC Use as directed four times per day E11.9 08/10/22   Norleen Lynwood ORN, MD  latanoprost (XALATAN) 0.005 % ophthalmic solution 1 drop at bedtime. 12/24/22   [provider]  mexiletine (MEXITIL ) 250 MG capsule TAKE 1 CAPSULE(250 MG) BY MOUTH EVERY 12 HOURS 09/26/23   Ollis, Brandi L, NP  nitroGLYCERIN  (NITROSTAT ) 0.4 MG SL tablet Place 1 tablet (0.4 mg total) under the tongue every 5 (five) minutes as needed for chest pain. N 08/27/22   Swinyer, Rosaline HERO, NP  sacubitril -valsartan  (ENTRESTO ) 97-103 MG Take 1 tablet by mouth 2 (two) times daily. 10/11/23   Aniceto Daphne CROME, NP  sertraline  (ZOLOFT ) 100 MG tablet Take 1 tablet (100 mg total) by mouth daily. 09/25/23   Norleen Lynwood ORN, MD  spironolactone  (ALDACTONE ) 25 MG tablet TAKE 1 TABLET(25 MG) BY MOUTH DAILY 12/19/23   Wonda Sharper, MD  tirzepatide  (MOUNJARO ) 5 MG/0.5ML Pen Inject 5 mg into the skin once a week. 09/25/23   Norleen Lynwood ORN, MD    Family History  Problem Relation Age of Onset   Depression Mother    Cancer Mother        ovarian   Hypertension Mother        Died, 62   Other Father        motor vehicle accident   Coronary artery disease Other    Heart attack  Neg Hx    Stroke Neg Hx      Social History   Tobacco Use   Smoking status: Former    Current packs/day: 0.00    Average packs/day: 1 pack/day for 34.0 years (34.0 ttl pk-yrs)    Types: Cigarettes    Start date: 06/11/1977    Quit date: 06/12/2011    Years since quitting: 12.6    Passive exposure: Past   Smokeless tobacco: Never  Vaping Use   Vaping status: Never Used  Substance Use Topics   Alcohol use: No   Drug use: No    Comment: remote marijuana use    Allergies as of 01/20/2024 - Review Complete 01/20/2024  Allergen Reaction Noted   Penicillin g Other (See Comments) 05/12/2019   Sglt2 inhibitors Other (See Comments) 09/28/2022   Penicillins Other (See Comments) 01/27/2007    Review of Systems:    All systems reviewed and negative except where noted in HPI.   Physical Exam:  There were no vitals taken for this visit. No LMP for male patient.  General:   Alert,  Well-developed, well-nourished, pleasant and cooperative in NAD Lungs:  Respirations even and unlabored.  Clear throughout to auscultation.   No wheezes, crackles, or rhonchi. No acute distress. Heart:  Regular rate and rhythm; no murmurs, clicks, rubs, or gallops. Abdomen:  Normal bowel sounds.  No bruits.  Soft, and non-distended without masses, hepatosplenomegaly or hernias noted.  No Tenderness.  No guarding or rebound tenderness.    Neurologic:  Alert and oriented x3;  grossly normal neurologically. Psych:  Alert and cooperative. Normal mood and affect.  Imaging Studies: No results found.  Labs: CBC    Component Value Date/Time   WBC 5.0 09/28/2023 1506   RBC 4.23 09/28/2023 1506   HGB 12.4 (L) 09/28/2023 1506   HCT 38.5 (L) 09/28/2023 1506   PLT 157 09/28/2023 1506   MCV 91.0 09/28/2023 1506    CMP  Component Value Date/Time   NA 141 09/28/2023 1506   NA 146 (H) 03/09/2022 1319   K 5.0 09/28/2023 1506   CL 114 (H) 09/28/2023 1506   CO2 19 (L) 09/28/2023 1506   GLUCOSE 83 09/28/2023  1506   BUN 19 09/28/2023 1506   BUN 15 03/09/2022 1319   CREATININE 1.45 (H) 09/28/2023 1506   CREATININE 1.75 (H) 12/27/2015 1558   CALCIUM  8.7 (L) 09/28/2023 1506   PROT 7.0 08/06/2023 0921   PROT 6.5 05/27/2020 0857   ALBUMIN 4.2 08/06/2023 0921   ALBUMIN 4.0 05/27/2020 0857   AST 14 08/06/2023 0921   ALT 17 08/06/2023 0921   ALKPHOS 52 08/06/2023 0921   BILITOT 0.6 08/06/2023 0921   BILITOT 0.2 05/27/2020 0857   GFRNONAA 53 (L) 09/28/2023 1506   GFRAA 57 (L) 05/27/2020 0857    Assessment and Plan:   [name] [name] is a 67 y.o. y/o male has been referred for ***  Follow up ***  Ellouise Console, PA-C

## 2024-01-20 NOTE — Progress Notes (Addendum)
 This patient returns to my office for at risk foot care.  This patient requires this care by a professional since this patient will be at risk due to having pre-ulcerative calluses, CKD and diabetic neuropathy and coagulation defect.  Patient is taking plavix .  This patient is unable to cut nails himself since the patient cannot reach his nails.These nails are painful walking and wearing shoes.  He also has callus on his right forefoot This patient presents for at risk foot care today.    General Appearance  Alert, conversant and in no acute stress.  Vascular  Dorsalis pedis and posterior tibial  pulses are  weakly  palpable  bilaterally.  Capillary return is within normal limits  bilaterally. Temperature is within normal limits  bilaterally.  Neurologic  Senn-Weinstein monofilament wire test diminished   bilaterally. Muscle power within normal limits bilaterally.  Nails Thick disfigured discolored nails with subungual debris  from hallux to fifth toes bilaterally. No evidence of bacterial infection or drainage bilaterally.  Orthopedic  No limitations of motion  feet .  No crepitus or effusions noted.  No bony pathology or digital deformities noted.  Plantar flexed second metatarsal  B/L.  Skin  normotropic skin  noted bilaterally.  No signs of infections or ulcers noted.   Pre-ulcerous callus sub 2  B/L symptomatic,  Callus sub 5 left foot.  Onychomycosis  Pain in right toes  Pain in left toes  Porokeratosis/Pre-ulcerous callus  B/L.  Consent was obtained for treatment procedures.   Mechanical debridement of nails 1-5  bilaterally performed with a nail nipper.  Filed with dremel without incident.  Debride pre-ulcerous calluses with # 15 blade.  Debride  callus with # 15 blade and dremel tool.   Return office visit     10 weeks               Told patient to return for periodic foot care and evaluation due to potential at risk complications.   Cordella Bold DPM

## 2024-01-21 ENCOUNTER — Ambulatory Visit: Admitting: Psychology

## 2024-02-04 ENCOUNTER — Ambulatory Visit: Payer: 59 | Admitting: Internal Medicine

## 2024-02-18 ENCOUNTER — Other Ambulatory Visit: Payer: Self-pay | Admitting: Internal Medicine

## 2024-02-19 ENCOUNTER — Telehealth: Payer: Self-pay

## 2024-02-19 NOTE — Telephone Encounter (Signed)
 Call received from Pt in regards to remote monitoring.  Per Pt he was contacted by [employer] and advised he was not transmitting.  They had Pt delete APP and reinstall, but did not remain on the line with the Pt.  He calls device clinic with trouble redownloading the app.  Will have Pt come to office tomorrow to trouble shoot his monitor.

## 2024-02-20 NOTE — Telephone Encounter (Signed)
 Pt did not come to clinic.  Will await further needs/call back.

## 2024-02-28 ENCOUNTER — Other Ambulatory Visit: Payer: Self-pay | Admitting: Cardiovascular Disease

## 2024-03-18 ENCOUNTER — Other Ambulatory Visit: Payer: Self-pay | Admitting: Nurse Practitioner

## 2024-03-20 NOTE — Progress Notes (Deleted)
  Electrophysiology Office Note:   Date:  03/20/2024  ID:  [name] [name], DOB 21-Aug-1955, MRN [id number]  Primary Cardiologist: Ozell Fell, MD Primary Heart Failure: None Electrophysiologist: Eulas FORBES Furbish, MD   {Click to update primary MD,subspecialty MD or APP then REFRESH:1}    History of Present Illness:   [name] [name] is a 67 y.o. male with h/o HFrEF, ICM, VT s/p BiV ICD, CAD seen today for routine electrophysiology followup.   Since last being seen in our clinic the patient reports doing ***.    He denies chest pain, palpitations, dyspnea, PND, orthopnea, nausea, vomiting, dizziness, syncope, edema, weight gain, or early satiety.   Review of systems complete and found to be negative unless listed in HPI.    EP Information / Studies Reviewed:    EKG is not ordered today. EKG from 09/30/23 reviewed which showed SR with RBBB      ICD Interrogation-  reviewed in detail today,  See PACEART report.  Device History: [employer] BiV ICD implanted 08/14/2022 for RBBB, VT, HFrEF Phrenic stim with high output on LV capture testing > auto capture turned off  History of appropriate therapy: No History of AAD therapy: Yes; currently on amiodarone     Studies:  ECHO 08/12/22 > LVEF 30-35%, WMA's present, GIIDD, mildly dilated LA  LHC 08/2022 > severe CAD, 99% RCA lesion, 90% distal LAD lesion too distal for intervention    Arrhythmia / AAD VT      Risk Assessment/Calculations:     No BP recorded.  {Refresh Note OR Click here to enter BP  :1}***        Physical Exam:   VS:  There were no vitals taken for this visit.   Wt Readings from Last 3 Encounters:  10/15/23 243 lb (110.2 kg)  09/26/23 253 lb 3.2 oz (114.9 kg)  09/25/23 254 lb (115.2 kg)     GEN: Well nourished, well developed in no acute distress NECK: No JVD; No carotid bruits CARDIAC: {EPRHYTHM:[id number]}, no murmurs, rubs, gallops RESPIRATORY:  Clear to auscultation without rales, wheezing or rhonchi  ABDOMEN: Soft,  non-tender, non-distended EXTREMITIES:  No edema; No deformity   ASSESSMENT AND PLAN:    Chronic Systolic Dysfunction s/p [employer] CRT-D  VT High Risk Medication Monitoring: Amiodarone  -euvolemic on exam / by device  -Stable on an appropriate medical regimen -significant weight loss on Mounjaro   -Normal ICD function -See Pace Art report -No changes today -Mexiletine 250 mg BID  -amiodarone  200 mg daily  -repeat amio labs > LFT's, TSH/free T4  CAD  Chronic disease, not amenable to intervention on [facility] 08/2022  -no anginal symptoms    Disposition:   Follow up with Dr. Furbish {EPFOLLOW [id number]}   Signed, Daphne Barrack, NP-C, AGACNP-BC [facility] - Electrophysiology  03/20/2024, 2:05 PM

## 2024-03-30 ENCOUNTER — Ambulatory Visit: Attending: Pulmonary Disease | Admitting: Pulmonary Disease

## 2024-03-30 DIAGNOSIS — I472 Ventricular tachycardia, unspecified: Secondary | ICD-10-CM

## 2024-03-30 DIAGNOSIS — I2510 Atherosclerotic heart disease of native coronary artery without angina pectoris: Secondary | ICD-10-CM

## 2024-03-30 DIAGNOSIS — Z5181 Encounter for therapeutic drug level monitoring: Secondary | ICD-10-CM

## 2024-03-30 DIAGNOSIS — I5022 Chronic systolic (congestive) heart failure: Secondary | ICD-10-CM

## 2024-03-30 DIAGNOSIS — I255 Ischemic cardiomyopathy: Secondary | ICD-10-CM

## 2024-04-03 ENCOUNTER — Ambulatory Visit: Admitting: Podiatry

## 2024-04-08 ENCOUNTER — Ambulatory Visit: Attending: Cardiovascular Disease

## 2024-04-08 ENCOUNTER — Other Ambulatory Visit: Payer: Self-pay | Admitting: Cardiovascular Disease

## 2024-04-08 DIAGNOSIS — I255 Ischemic cardiomyopathy: Secondary | ICD-10-CM | POA: Diagnosis not present

## 2024-04-08 LAB — CUP PACEART REMOTE DEVICE CHECK
Battery Remaining Longevity: 78 mo
Battery Remaining Percentage: 78 %
Battery Voltage: 2.99 V
Brady Statistic AP VP Percent: 77 %
Brady Statistic AP VS Percent: 1 %
Brady Statistic AS VP Percent: 22 %
Brady Statistic AS VS Percent: 1 %
Brady Statistic RA Percent Paced: 77 %
Date Time Interrogation Session: 20251029145944
HighPow Impedance: 68 Ohm
Implantable Lead Connection Status: 753985
Implantable Lead Connection Status: 753985
Implantable Lead Connection Status: 753985
Implantable Lead Implant Date: 20240305
Implantable Lead Implant Date: 20240305
Implantable Lead Implant Date: 20240305
Implantable Lead Location: 753858
Implantable Lead Location: 753859
Implantable Lead Location: 753860
Implantable Pulse Generator Implant Date: 20240305
Lead Channel Impedance Value: 440 Ohm
Lead Channel Impedance Value: 460 Ohm
Lead Channel Impedance Value: 780 Ohm
Lead Channel Pacing Threshold Amplitude: 0.5 V
Lead Channel Pacing Threshold Amplitude: 0.625 V
Lead Channel Pacing Threshold Amplitude: 0.75 V
Lead Channel Pacing Threshold Pulse Width: 0.5 ms
Lead Channel Pacing Threshold Pulse Width: 0.5 ms
Lead Channel Pacing Threshold Pulse Width: 0.5 ms
Lead Channel Sensing Intrinsic Amplitude: 12 mV
Lead Channel Sensing Intrinsic Amplitude: 3.2 mV
Lead Channel Setting Pacing Amplitude: 1 V
Lead Channel Setting Pacing Amplitude: 1.5 V
Lead Channel Setting Pacing Amplitude: 1.625
Lead Channel Setting Pacing Pulse Width: 0.5 ms
Lead Channel Setting Pacing Pulse Width: 0.5 ms
Lead Channel Setting Sensing Sensitivity: 0.5 mV
Pulse Gen Serial Number: 810089146

## 2024-04-09 ENCOUNTER — Ambulatory Visit (INDEPENDENT_AMBULATORY_CARE_PROVIDER_SITE_OTHER): Admitting: Podiatry

## 2024-04-09 ENCOUNTER — Encounter: Payer: Self-pay | Admitting: Podiatry

## 2024-04-09 DIAGNOSIS — B351 Tinea unguium: Secondary | ICD-10-CM

## 2024-04-09 DIAGNOSIS — E1159 Type 2 diabetes mellitus with other circulatory complications: Secondary | ICD-10-CM

## 2024-04-09 DIAGNOSIS — M79675 Pain in left toe(s): Secondary | ICD-10-CM | POA: Diagnosis not present

## 2024-04-09 DIAGNOSIS — M79674 Pain in right toe(s): Secondary | ICD-10-CM

## 2024-04-09 NOTE — Progress Notes (Addendum)
 This patient returns to my office for at risk foot care.  This patient requires this care by a professional since this patient will be at risk due to having pre-ulcerative calluses, CKD and diabetic neuropathy and coagulation defect.  Patient is taking plavix .  This patient is unable to cut nails himself since the patient cannot reach his nails.These nails are painful walking and wearing shoes.  He also has callus on his right forefoot This patient presents for at risk foot care today.    General Appearance  Alert, conversant and in no acute stress.  Vascular  Dorsalis pedis and posterior tibial  pulses are  weakly  palpable  bilaterally.  Capillary return is within normal limits  bilaterally. Temperature is within normal limits  bilaterally.  Neurologic  Senn-Weinstein monofilament wire test diminished   bilaterally. Muscle power within normal limits bilaterally.  Nails Thick disfigured discolored nails with subungual debris  from hallux to fifth toes bilaterally. No evidence of bacterial infection or drainage bilaterally.  Orthopedic  No limitations of motion  feet .  No crepitus or effusions noted.  No bony pathology or digital deformities noted.  Plantar flexed second metatarsal  B/L.  Skin  normotropic skin  noted bilaterally.  No signs of infections or ulcers noted.   Pre-ulcerous callus sub 2  B/L asymptomatic,  Callus sub 5 left foot asymptomatic.  Onychomycosis  Pain in right toes  Pain in left toes  Porokeratosis/Pre-ulcerous callus  B/L.  Consent was obtained for treatment procedures.   Mechanical debridement of nails 1-5  bilaterally performed with a nail nipper.  Filed with dremel without incident.    Debride  callus with dremel tool.   Return office visit     10 weeks               Told patient to return for periodic foot care and evaluation due to potential at risk complications.   Cordella Bold DPM tomma

## 2024-04-15 NOTE — Progress Notes (Signed)
 Remote ICD Transmission

## 2024-04-16 ENCOUNTER — Ambulatory Visit: Payer: Self-pay | Admitting: Cardiovascular Disease

## 2024-04-20 ENCOUNTER — Other Ambulatory Visit: Payer: Self-pay | Admitting: Nurse Practitioner

## 2024-05-04 ENCOUNTER — Encounter: Payer: Self-pay | Admitting: *Deleted

## 2024-05-04 NOTE — Progress Notes (Signed)
 Fortino Haag                                          MRN: [id number]   05/04/2024   The [facility] Quality Team Specialist reviewed this patient medical record for the purposes of chart review for care gap closure. The following were reviewed: abstraction for care gap closure-glycemic status assessment.    [facility] Quality Team

## 2024-05-04 NOTE — Progress Notes (Signed)
 [name] [name]                                          MRN: [id number]   05/04/2024   The [facility] Quality Team Specialist reviewed this patient medical record for the purposes of chart review for care gap closure. The following were reviewed: chart review for care gap closure-colorectal cancer screening, diabetic eye exam, and kidney health evaluation for diabetes:eGFR  and uACR.    [facility] Quality Team

## 2024-05-12 ENCOUNTER — Other Ambulatory Visit: Payer: Self-pay | Admitting: Cardiovascular Disease

## 2024-05-19 ENCOUNTER — Ambulatory Visit: Admitting: Internal Medicine

## 2024-05-25 ENCOUNTER — Ambulatory Visit: Admitting: Internal Medicine

## 2024-06-08 ENCOUNTER — Other Ambulatory Visit: Payer: Self-pay | Admitting: Nurse Practitioner

## 2024-07-07 ENCOUNTER — Other Ambulatory Visit: Payer: Self-pay | Admitting: Cardiovascular Disease

## 2024-07-08 ENCOUNTER — Ambulatory Visit: Attending: Cardiovascular Disease

## 2024-07-08 DIAGNOSIS — I255 Ischemic cardiomyopathy: Secondary | ICD-10-CM

## 2024-07-09 ENCOUNTER — Ambulatory Visit: Admitting: Podiatry

## 2024-07-10 LAB — CUP PACEART REMOTE DEVICE CHECK
Battery Remaining Longevity: 74 mo
Battery Remaining Percentage: 75 %
Battery Voltage: 2.99 V
Brady Statistic AP VP Percent: 76 %
Brady Statistic AP VS Percent: 1 %
Brady Statistic AS VP Percent: 23 %
Brady Statistic AS VS Percent: 1 %
Brady Statistic RA Percent Paced: 76 %
Date Time Interrogation Session: 20260129093558
HighPow Impedance: 69 Ohm
Implantable Lead Connection Status: 753985
Implantable Lead Connection Status: 753985
Implantable Lead Connection Status: 753985
Implantable Lead Implant Date: 20240305
Implantable Lead Implant Date: 20240305
Implantable Lead Implant Date: 20240305
Implantable Lead Location: 753858
Implantable Lead Location: 753859
Implantable Lead Location: 753860
Implantable Pulse Generator Implant Date: 20240305
Lead Channel Impedance Value: 430 Ohm
Lead Channel Impedance Value: 460 Ohm
Lead Channel Impedance Value: 830 Ohm
Lead Channel Pacing Threshold Amplitude: 0.5 V
Lead Channel Pacing Threshold Amplitude: 0.75 V
Lead Channel Pacing Threshold Amplitude: 0.75 V
Lead Channel Pacing Threshold Pulse Width: 0.5 ms
Lead Channel Pacing Threshold Pulse Width: 0.5 ms
Lead Channel Pacing Threshold Pulse Width: 0.5 ms
Lead Channel Sensing Intrinsic Amplitude: 3.2 mV
Lead Channel Sensing Intrinsic Amplitude: 3.6 mV
Lead Channel Setting Pacing Amplitude: 1 V
Lead Channel Setting Pacing Amplitude: 1.5 V
Lead Channel Setting Pacing Amplitude: 1.75 V
Lead Channel Setting Pacing Pulse Width: 0.5 ms
Lead Channel Setting Pacing Pulse Width: 0.5 ms
Lead Channel Setting Sensing Sensitivity: 0.5 mV
Pulse Gen Serial Number: 810089146

## 2024-07-16 NOTE — Progress Notes (Signed)
 Remote ICD Transmission

## 2024-10-07 ENCOUNTER — Ambulatory Visit

## 2024-10-16 ENCOUNTER — Ambulatory Visit

## 2025-01-06 ENCOUNTER — Ambulatory Visit

## 2025-04-07 ENCOUNTER — Ambulatory Visit

## 2025-07-07 ENCOUNTER — Ambulatory Visit

## 2025-10-06 ENCOUNTER — Ambulatory Visit
